# Patient Record
Sex: Male | Born: 1960
Health system: Southern US, Community
[De-identification: ages and names within clinical notes are randomized; demographics above are authoritative.]

## PROBLEM LIST (undated history)

## (undated) DIAGNOSIS — M1990 Unspecified osteoarthritis, unspecified site: Secondary | ICD-10-CM

## (undated) DIAGNOSIS — F10231 Alcohol dependence with withdrawal delirium: Secondary | ICD-10-CM

## (undated) DIAGNOSIS — I110 Hypertensive heart disease with heart failure: Secondary | ICD-10-CM

## (undated) DIAGNOSIS — F1910 Other psychoactive substance abuse, uncomplicated: Secondary | ICD-10-CM

## (undated) DIAGNOSIS — I639 Cerebral infarction, unspecified: Secondary | ICD-10-CM

## (undated) DIAGNOSIS — F209 Schizophrenia, unspecified: Secondary | ICD-10-CM

## (undated) DIAGNOSIS — F329 Major depressive disorder, single episode, unspecified: Secondary | ICD-10-CM

## (undated) DIAGNOSIS — F1010 Alcohol abuse, uncomplicated: Secondary | ICD-10-CM

## (undated) DIAGNOSIS — Z91148 Patient's other noncompliance with medication regimen for other reason: Secondary | ICD-10-CM

## (undated) DIAGNOSIS — M109 Gout, unspecified: Secondary | ICD-10-CM

## (undated) DIAGNOSIS — Z9114 Patient's other noncompliance with medication regimen: Secondary | ICD-10-CM

## (undated) DIAGNOSIS — F32A Depression, unspecified: Secondary | ICD-10-CM

## (undated) DIAGNOSIS — F10931 Alcohol use, unspecified with withdrawal delirium: Secondary | ICD-10-CM

## (undated) DIAGNOSIS — I5020 Unspecified systolic (congestive) heart failure: Secondary | ICD-10-CM

## (undated) DIAGNOSIS — F1490 Cocaine use, unspecified, uncomplicated: Secondary | ICD-10-CM

## (undated) DIAGNOSIS — I10 Essential (primary) hypertension: Secondary | ICD-10-CM

## (undated) HISTORY — PX: TONSILLECTOMY: SUR1361

## (undated) HISTORY — PX: MANDIBLE RECONSTRUCTION: SHX431

---

## 2002-10-09 ENCOUNTER — Emergency Department (HOSPITAL_COMMUNITY): Admission: EM | Admit: 2002-10-09 | Discharge: 2002-10-09 | Payer: Self-pay | Admitting: Emergency Medicine

## 2006-03-27 ENCOUNTER — Inpatient Hospital Stay (HOSPITAL_COMMUNITY): Admission: RE | Admit: 2006-03-27 | Discharge: 2006-04-01 | Payer: Self-pay | Admitting: Psychiatry

## 2006-03-27 ENCOUNTER — Ambulatory Visit: Payer: Self-pay | Admitting: Psychiatry

## 2008-01-23 ENCOUNTER — Inpatient Hospital Stay (HOSPITAL_COMMUNITY): Admission: EM | Admit: 2008-01-23 | Discharge: 2008-01-24 | Payer: Self-pay | Admitting: Emergency Medicine

## 2008-09-21 ENCOUNTER — Emergency Department (HOSPITAL_BASED_OUTPATIENT_CLINIC_OR_DEPARTMENT_OTHER): Admission: EM | Admit: 2008-09-21 | Discharge: 2008-09-21 | Payer: Self-pay | Admitting: Emergency Medicine

## 2008-11-13 ENCOUNTER — Other Ambulatory Visit: Payer: Self-pay | Admitting: Emergency Medicine

## 2008-11-14 ENCOUNTER — Inpatient Hospital Stay (HOSPITAL_COMMUNITY): Admission: EM | Admit: 2008-11-14 | Discharge: 2008-11-17 | Payer: Self-pay | Admitting: Internal Medicine

## 2008-11-14 ENCOUNTER — Ambulatory Visit: Payer: Self-pay | Admitting: Cardiology

## 2008-11-16 ENCOUNTER — Encounter (INDEPENDENT_AMBULATORY_CARE_PROVIDER_SITE_OTHER): Payer: Self-pay | Admitting: Internal Medicine

## 2008-12-30 ENCOUNTER — Inpatient Hospital Stay (HOSPITAL_COMMUNITY): Admission: EM | Admit: 2008-12-30 | Discharge: 2009-01-03 | Payer: Self-pay | Admitting: Emergency Medicine

## 2009-07-26 ENCOUNTER — Ambulatory Visit (HOSPITAL_BASED_OUTPATIENT_CLINIC_OR_DEPARTMENT_OTHER): Admission: RE | Admit: 2009-07-26 | Discharge: 2009-07-26 | Payer: Self-pay | Admitting: Geriatric Medicine

## 2009-07-26 ENCOUNTER — Ambulatory Visit: Payer: Self-pay | Admitting: Diagnostic Radiology

## 2011-03-05 LAB — CBC
HCT: 39.3 % (ref 39.0–52.0)
Hemoglobin: 13.3 g/dL (ref 13.0–17.0)
MCHC: 33.8 g/dL (ref 30.0–36.0)
MCHC: 34 g/dL (ref 30.0–36.0)
MCHC: 34.2 g/dL (ref 30.0–36.0)
MCV: 92.7 fL (ref 78.0–100.0)
MCV: 92.8 fL (ref 78.0–100.0)
Platelets: 74 10*3/uL — ABNORMAL LOW (ref 150–400)
Platelets: 80 10*3/uL — ABNORMAL LOW (ref 150–400)
Platelets: 97 10*3/uL — ABNORMAL LOW (ref 150–400)
RBC: 4.11 MIL/uL — ABNORMAL LOW (ref 4.22–5.81)
RDW: 12.6 % (ref 11.5–15.5)
RDW: 13 % (ref 11.5–15.5)
RDW: 13.2 % (ref 11.5–15.5)
RDW: 13.3 % (ref 11.5–15.5)

## 2011-03-05 LAB — COMPREHENSIVE METABOLIC PANEL
ALT: 43 U/L (ref 0–53)
ALT: 52 U/L (ref 0–53)
AST: 118 U/L — ABNORMAL HIGH (ref 0–37)
AST: 82 U/L — ABNORMAL HIGH (ref 0–37)
AST: 84 U/L — ABNORMAL HIGH (ref 0–37)
Albumin: 3.3 g/dL — ABNORMAL LOW (ref 3.5–5.2)
Albumin: 3.4 g/dL — ABNORMAL LOW (ref 3.5–5.2)
Alkaline Phosphatase: 99 U/L (ref 39–117)
CO2: 28 mEq/L (ref 19–32)
Calcium: 8.8 mg/dL (ref 8.4–10.5)
Calcium: 9.1 mg/dL (ref 8.4–10.5)
Creatinine, Ser: 1.01 mg/dL (ref 0.4–1.5)
GFR calc Af Amer: >60 mL/min (ref >60)
GFR calc Af Amer: >60 mL/min (ref >60)
GFR calc Af Amer: >60 mL/min (ref >60)
GFR calc non Af Amer: >60 mL/min (ref >60)
Glucose, Bld: 123 mg/dL — ABNORMAL HIGH (ref 70–99)
Potassium: 4 mEq/L (ref 3.5–5.1)
Sodium: 136 mEq/L (ref 135–145)
Sodium: 139 mEq/L (ref 135–145)
Total Protein: 6.8 g/dL (ref 6.0–8.3)
Total Protein: 7 g/dL (ref 6.0–8.3)

## 2011-03-05 LAB — DIFFERENTIAL
Basophils Absolute: 0 10*3/uL (ref 0.0–0.1)
Eosinophils Absolute: 0.1 10*3/uL (ref 0.0–0.7)
Eosinophils Relative: 2 % (ref 0–5)
Lymphocytes Relative: 37 % (ref 12–46)
Monocytes Absolute: 0.8 10*3/uL (ref 0.1–1.0)

## 2011-03-05 LAB — BASIC METABOLIC PANEL
BUN: 11 mg/dL (ref 6–23)
BUN: 14 mg/dL (ref 6–23)
CO2: 25 mEq/L (ref 19–32)
CO2: 27 mEq/L (ref 19–32)
Chloride: 101 mEq/L (ref 96–112)
Chloride: 107 mEq/L (ref 96–112)
Creatinine, Ser: 1.02 mg/dL (ref 0.4–1.5)
GFR calc Af Amer: >60 mL/min (ref >60)
GFR calc non Af Amer: >60 mL/min (ref >60)
Glucose, Bld: 103 mg/dL — ABNORMAL HIGH (ref 70–99)
Glucose, Bld: 94 mg/dL (ref 70–99)
Potassium: 4.1 mEq/L (ref 3.5–5.1)
Sodium: 139 mEq/L (ref 135–145)

## 2011-03-05 LAB — URINALYSIS, ROUTINE W REFLEX MICROSCOPIC
Glucose, UA: NEGATIVE mg/dL
Hgb urine dipstick: NEGATIVE
Ketones, ur: NEGATIVE mg/dL
Protein, ur: NEGATIVE mg/dL
Urobilinogen, UA: 0.2 mg/dL (ref 0.0–1.0)

## 2011-03-05 LAB — POCT CARDIAC MARKERS: Troponin i, poc: <0.05 ng/mL (ref 0.00–0.09)

## 2011-03-05 LAB — PHOSPHORUS: Phosphorus: 4.4 mg/dL (ref 2.3–4.6)

## 2011-03-05 LAB — CARDIAC PANEL(CRET KIN+CKTOT+MB+TROPI)
CK, MB: 1.8 ng/mL (ref 0.3–4.0)
CK, MB: 2.5 ng/mL (ref 0.3–4.0)
Relative Index: 0.7 (ref 0.0–2.5)
Relative Index: 0.8 (ref 0.0–2.5)
Troponin I: 0.01 ng/mL (ref 0.00–0.06)
Troponin I: <0.01 ng/mL (ref 0.00–0.06)

## 2011-03-05 LAB — TSH: TSH: 3.865 u[IU]/mL (ref 0.350–4.500)

## 2011-03-05 LAB — TROPONIN I: Troponin I: <0.01 ng/mL (ref 0.00–0.06)

## 2011-03-05 LAB — RAPID URINE DRUG SCREEN, HOSP PERFORMED
Amphetamines: NOT DETECTED
Benzodiazepines: POSITIVE — AB
Cocaine: POSITIVE — AB

## 2011-03-05 LAB — LIPID PANEL: VLDL: 24 mg/dL (ref 0–40)

## 2011-03-05 LAB — APTT: aPTT: 33 seconds (ref 24–37)

## 2011-03-05 LAB — HEMOGLOBIN A1C: Mean Plasma Glucose: 108 mg/dL

## 2011-03-05 LAB — PROTIME-INR: Prothrombin Time: 14.4 seconds (ref 11.6–15.2)

## 2011-03-05 LAB — TECHNOLOGIST SMEAR REVIEW

## 2011-03-05 LAB — D-DIMER, QUANTITATIVE: D-Dimer, Quant: 0.42 ug/mL-FEU (ref 0.00–0.48)

## 2011-04-02 NOTE — Discharge Summary (Signed)
Brown, [name]        ACCOUNT NO.:  [id number]   MEDICAL RECORD NO.:  [id number]          PATIENT TYPE:  INP   LOCATION:  4702                         FACILITY:  [facility]   PHYSICIAN:  Vernell Leep, MD     DATE OF BIRTH:  September 30, 1961   DATE OF ADMISSION:  12/29/2008  DATE OF DISCHARGE:  01/03/2009                               DISCHARGE SUMMARY   ADDENDUM:  Please refer to the detailed discharge summary done by this  dictator on January 02, 2009.  This will update events since.   DISCHARGE DIAGNOSES:  Same.   DISCHARGE MEDICATIONS:  1. Enteric-coated aspirin 81 mg p.o. daily.  2. Omeprazole 20 mg p.o. daily.  3. Thiamine 100 mg p.o. daily.  4. Folate 1 mg p.o. daily.  5. Hydralazine 50 mg p.o. q.i.d.  6. Risperidone 2 mg p.o. daily.  7. Clonidine 0.2 mg p.o. t.i.d.  8. Hydrochlorothiazide 25 mg p.o. daily.  9. Colace 100 mg p.o. b.i.d.  10.Amlodipine 10 mg p.o. daily.   HOSPITAL COURSE AND PATIENT DISPOSITION:  Since yesterday, the patient  from a medical standpoint was ready for discharge to [facility].  The patient was declined by the [facility] today  indicating that he did not meet criteria for inpatient admission there.  Dr. Rhona Raider was contacted who saw the patient again today and  indicated in his brief note that the mood was better and the patient has  positive interests in his guitar, funk music, and denied suicidal  ideations, homicidal ideations.  Dr. Rhona Raider has cleared him for  outpatient management at [facility].  Social worker was contacted who  indicates that [facility] might not be able to accept him today.  Hence,  the patient will be discharged home and he is to proceed to [facility]  whenever they are able to accept him.  The patient had indicated that he  did not have a primary medical doctor.  Case manager was contacted and  has provided him with the physician reference line, that is [phone].  The patient has  been advised to call there to find a primary medical  doctor with whom he has to make an appointment to follow up in 1 to 2  weeks.   The patient has also been provided with the crisis hotline number, that  is [phone].   The patient indicated yesterday that he had fallen on his right shoulder  approximately 3 weeks ago and had some shoulder pain at that time and  was seen at the [facility] ED were x-rays suggested right shoulder  injury.  The details were not available.  He was informed by the M.D.  there that it might take 6 weeks to heal.  He was given a referral to go  to an orthopedic M.D., which the patient did not keep.  Now the patient  indicates that he has mild right shoulder pain on elevating the right  shoulder.  On examination, the shoulder itself was nontender or swollen  or deformed.  There was a chest x-ray that was suggestive of question  acromioclavicular joint separation injury on the right side.  I called  and discussed with orthopedic M.D. on call, Dr. Weber Cooks of  [facility], who suggested that there is no need for a  dedicated film or any active intervention at this time unless his  symptoms  worsen.  He was happy to follow him in outpatient followup.  A number  for him has been provided to the patient, that is [phone].  The patient  has been advised to call there for an appointment.  The patient, at this  time, is stable to be discharged.      Vernell Leep, MD  Electronically Signed     AH/MEDQ  D:  01/03/2009  T:  01/03/2009  Job:  [id number]   cc:   Weber Cooks, M.D.  Felizardo Hoffmann, M.D.

## 2011-04-02 NOTE — H&P (Signed)
[name], [name]        ACCOUNT NO.:  [id number]   MEDICAL RECORD NO.:  [id number]          PATIENT TYPE:  INP   LOCATION:  4702                         FACILITY:  [facility]   PHYSICIAN:  Delice Lesch, MD        DATE OF BIRTH:  02/18/1961   DATE OF ADMISSION:  12/29/2008  DATE OF DISCHARGE:                              HISTORY & PHYSICAL   CHIEF COMPLAINT:  Increased blood pressure, denied admission at rehab.   PRIMARY CARE PHYSICIAN:  Durwin Nora, MD   This is an admission for team B with [facility].   HISTORY OF PRESENT ILLNESS:  This is a 50 year old African American man  with past medical history of polysubstance abuse (crack cocaine,  alcohol, and tobacco) with hypertension which has been uncontrolled  secondary to medication noncompliance and schizophrenia presenting with  increased blood pressure.  He went to rehab center today to try enroll  in the program and he was sent to the hospital secondary to increased  blood pressure.  This is his second try to get into rehab.  He says he  drinks everyday one-half to one case of beer, uses crack cocaine  everyday.  He knows that he needs help to quit, but he has refused on  the grounds of his increased blood pressure.  He also has chest pain  every time he uses alcohol and drugs.  This is approximately 4/10, left-  sided, throbbing, like a toothache lasting 3 to 4 hours, not related  to inspiration, food, or movement.  He has occasionally shortness of  breath, and the pain is nonradiating nor associated with palpations and  relieved only after stopping alcohol and drugs.  He also has headaches  every time his blood pressure is high.  No blood in stool.   PAST MEDICAL HISTORY:  1. Substance abuse as mentioned above.  He tried to get in [facility] for detox.  2. Hypertension with recent admission for hypertensive urgency in      December 2009.  3. Schizophrenia/bipolar disease.  4. History of  thrombocytopenia.   FAMILY HISTORY:  Coronary artery disease.  Father had an MI when he was  in his 61s.   ALLERGIES:  No known drug allergies.   MEDICATIONS AT HOME:  He did not take any medications for a long time as  he could not afford them.  However, he got the medications today, and he  started to take them.  1. Metoprolol 50 mg p.o. b.i.d.  2. Clonidine 0.2 mg p.o. t.i.d.  3. Hydralazine 50 mg p.o. t.i.d.  4. Chlordiazepoxide 25 mg p.o. b.i.d. for 3 days per instructions at      discharge.  5. Omeprazole 20 mg p.o. daily.  6. Risperdal 2 mg p.o. daily.   SOCIAL HISTORY:  He is single.  He has 7 children.  He lives alone in  [place].  He is on disability for his schizophrenia.  Substance abuse  per above.  Of note, smokes 1 pack a day of cigarettes.  His mother is  alive and checks on him occasionally.  Last  drug use was the night prior  to admission when he was cracked, smoked cigarettes and drank alcohol.   REVIEW OF SYSTEMS:  Chest pain as mentioned in HPI, headaches, throbbing  at the time of admission, poor memory, night sweats.  He lost 12 pounds  in 2 months, nausea, vomiting, sometimes diarrhea but no constipation  and admitted for fevers but no chills with no specifics.   PHYSICAL EXAMINATION:  VITAL SIGNS:  Temperature 97, pulse 75, blood  pressure 158/112, respiration rate 20, oxygen saturation 100% on room  air.  GENERAL:  He was in no acute distress.  HEENT:  Moist mucous membranes.  PERRLA, no pinpoint pupils.  CARDIAC:  Regular rate and rhythm.  No murmurs, rub, or gallop.  PULMONARY:  Clear to auscultation bilaterally.  GI:  Soft, nontender, and nondistended.  Bowel sounds positive.  EXTREMITIES:  No edema.  NEURO:  Nonfocal.   LABORATORY DATA:  Chest x-ray, no acute changes, questionable AC joint  separation on the right.  UDS positive for barbiturates,  benzodiazepines, and cocaine.  Benzodiazepines are expected with his use  of chlordiazepoxide.   Urinalysis, cloudy, density 1.025, small  bilirubin, otherwise normal.  Point-of-care cardiac enzymes negative.  Alcohol less than 5.  Sodium 139, potassium 4.1, chloride 107, bicarb  25, BUN 14, creatinine 1.09, glucose 94, calcium 8.7.  White count 4.9,  hemoglobin 13.3, platelets 97.  Of note in 2009, platelets were 77.  His  MCV is 92.8.  His EKG shows inferolateral T-wave inversions likely  secondary to strain.  They were present on the previous EKG in December,  however, now they are more pronounced.   ASSESSMENT AND PLAN:  1. Chest pain, most likely vasospasm, related to cocaine use.  He does      not report more chest pain now than at the time that he uses      cocaine.  His point-of-care cardiac enzymes are negative.  EKG is      as mentioned above.  Only significant for T-wave inversions.  We      will continue to cycle cardiac enzymes.  Check EKG in the morning.      An UDS was checked and was positive for cocaine, barbiturates, and      benzodiazepines.  Due to the fact that he uses cocaine everyday, he      should not be on Lopressor either here nor in outpatient.  We will      give nitroglycerin, aspirin 325, clonidine home dose, and      hydralazine.  If cardiac enzymes become negative, he will need a      cath.  A 2-D echo in December 2009 showed an EF of 55%.  We will      also check a fasting lipid profile and hemoglobin A1c.  2. Hypertension.  The patient's blood pressure is elevated on arrival      and then increased to 138/98.  His diastolic pressure is increased.      We will check a TSH along with the fasting lipid profile and a      hemoglobin A1c.  We will continue his home regimen with clonidine      and hydralazine.  We will add p.r.n. labetalol for blood pressure      higher than [id number] systolic, again avoid Lopressor.  Of note, he was      admitted with hypertensive urgency in December 2009.  Most likely      the  cause of his hypertension is his polysubstance  abuse.  3. Polysubstance abuse.  I offered counseling.  He knows he needs to      quit; however, he believes that he cannot do it by himself.  He is      disappointed by the fact that he is not admitted to the rehab      facility because of his high blood pressure.  However, the blood      pressure has increased because of his drug use.  We will ask the      case manager to help with this vicious cycle.  His UDS is positive      as mentioned above.  We will place him on Ativan p.r.n. for      increased catecholaminergic response to cocaine.  We will monitor      him on CIWA, give him B1 and folate.  This is most likely the cause      of his weight loss.  4. Thrombocytopenia.  This is likely secondary to alcohol.  However,      we will check LFTs and coags looking for liver dysfunction.  We      will also check a peripheral smear to look for large platelets or      clumped platelets, and we will also check an HIV especially since      he has weight loss and night sweats.  5. Right shoulder pain.  This appears on the x-ray as [facility] joint      separation on the right.  We will give him Tylenol      for now.  He does report pain in that shoulder especially when he      lies on it, may need to have physical therapy in outpatient.  6. Prophylaxis.  We will place him on Lovenox and Protonix.      Philemon Kingdom, M.D.  Electronically Signed      Delice Lesch, MD     CG/MEDQ  D:  12/30/2008  T:  12/30/2008  Job:  [id number]

## 2011-04-02 NOTE — Discharge Summary (Signed)
[name], FLOWERS        ACCOUNT NO.:  [id number]   MEDICAL RECORD NO.:  [id number]          PATIENT TYPE:  INP   LOCATION:  2920                         FACILITY:  [facility]   PHYSICIAN:  Jacquelynn Cree, M.D.   DATE OF BIRTH:  November 22, 1960   DATE OF ADMISSION:  01/22/2008  DATE OF DISCHARGE:  01/24/2008                               DISCHARGE SUMMARY   PRIMARY CARE PHYSICIAN:  The patient is unassigned.   DISCHARGE DIAGNOSES:  1. Hypertensive crisis.  2. Schizophrenia.  3. Mild thrombocytopenia.  4. Epistaxis.  5. History of polysubstance abuse including cocaine and alcohol.   DISCHARGE MEDICATIONS:  1. Norvasc 10 mg daily.  2. Clonidine 0.1 mg t.i.d.  3. Lasix 40 mg daily.  4. Risperdal Consta through mental health.   CONSULTATIONS:  None.   BRIEF ADMISSION HISTORY OF PRESENT ILLNESS:  The patient is a 50-year-  old male with known history of schizophrenia, who presents with  headache, dizziness, epistaxis, and chest pain.  The patient is a  resident at [facility] and has been treated for substance abuse.  He is  disabled.  Upon initial evaluation, he was noted to have a blood  pressure of 196/125 and therefore was admitted for treatment of  hypertensive crisis.   PROCEDURES AND DIAGNOSTIC STUDIES:  Chest x-ray on January 22, 2008 showed  no active cardiopulmonary disease.   DISCHARGE LABORATORY VALUES:  Sodium was 139, potassium 3.5, chloride  104, bicarb 30, BUN 8, creatinine 1.14, glucose 118.  White blood cell  count was 4.5, hemoglobin 12.1, hematocrit 35.9, platelets 97.  Cardiac  enzymes were negative x3 sets.   HOSPITAL COURSE:  Problem:  1. Hypertensive crisis:  The patient was admitted and put on a Cardene      drip for blood pressure control.  Once blood pressure control was      achieved, he was transitioned over to p.o. medications including      Norvasc, Lasix, clonidine.  This regimen controlled his blood      pressure, and he was successfully weaned off  the Cardene drip.  The      patient's hypertensive crisis likely stems from noncompliance.  The      patient was counseled extensively, with regard to the importance of      taking all of his medications as prescribed and was following up      with a primary care physician.  2. Schizophrenia:  The patient was receives Risperdal Consta through      the mental health association.  He should continue to follow up      with them for his regular injections.  3. Epistaxis:  The patient's epistaxis likely stemmed from his      hypertensive crisis, along with mild thrombocytopenia.  It      completely resolved, with control of his blood pressure.  4. Mild thrombocytopenia:  May be sequelae from toxic effects of      alcohol and bone marrow.  He does have a history of substance      abuse.   DISPOSITION:  The patient will be discharged back to [facility], which is  a substance abuse halfway house.  He should follow up with his regular  primary care physician in 1 to 2 weeks.  If he does not have a local  physician, the number to obtain a referral to a local physician was  provided to the patient at discharge.      Jacquelynn Cree, M.D.  Electronically Signed     CR/MEDQ  D:  01/24/2008  T:  01/25/2008  Job:  [id number]

## 2011-04-02 NOTE — H&P (Signed)
[name], [name]        ACCOUNT NO.:  [id number]   MEDICAL RECORD NO.:  [id number]          PATIENT TYPE:  INP   LOCATION:  2920                         FACILITY:  [facility]   PHYSICIAN:  Edythe Lynn, M.D.       DATE OF BIRTH:  1961/05/29   DATE OF ADMISSION:  01/22/2008  DATE OF DISCHARGE:  01/24/2008                              HISTORY & PHYSICAL   PRIMARY CARE PHYSICIAN:  This patient does not have a primary care  physician.   CHIEF COMPLAINT:  Headaches.   HISTORY OF PRESENT ILLNESS:  [name] is a 50 year old gentleman  with history of hypertension, currently not taking any medications, who  presents today to the emergency room after about 3 days of constant  headaches, dizziness and no chest pain.  The patient was at [facility]  for detoxification for cocaine and alcohol.  He used to be on clonidine  but has not been taking it for a while because, I don't need it  anymore.   PAST MEDICAL HISTORY:  1. Hypertension.  2. Schizophrenia.   FAMILY HISTORY:  Mother alive, age 11, diabetes.  Father died of unknown  cause.  Two sisters and three brothers healthy.   SOCIAL HISTORY:  The patient is abusing cocaine and alcohol.  He has got  seven children, all grown up and gone.  He is on disability for  schizophrenia.  Lives alone.   ALLERGIES:  NO KNOWN DRUG ALLERGIES.   MEDICATIONS:  1. Risperdal.  2. Consta intramuscularly once every 2 weeks.   REVIEW OF SYSTEMS:  Positive for occasional chest pains.  Otherwise per  HPI.  All other systems negative.   PHYSICAL EXAM:  VITAL SIGNS:  Upon admission, temperature 98.6, pulse  72, respirations 18, blood pressure 196/125, saturation 100% on room  air.  GENERAL APPEARANCE:  Muscular, well-built, African American gentleman  sitting on the stretcher.  He is lethargic but arousable.  He is not  oriented to place, person, time and situation.  HEAD:  Normocephalic, atraumatic.  Eyes:  Pupils equal, round, reactive  to  light and accommodation.  Extraocular movements are intact.  Throat  clear.  NECK: Supple.  No JVD.  CHEST: Clear to auscultation without wheezes, rhonchi or crackles.  HEART:  Regular rate and rhythm without murmurs, rubs or gallop.  ABDOMEN:  The patient's abdomen is soft, nontender, nondistended.  Bowel  sounds are present.  LOWER EXTREMITIES: Without edema.  SKIN:  Warm, dry, without any suspicious rashes.  NEUROLOGICAL EXAM:  Nonfocal.   LABORATORY DATA:  Sodium 139, potassium 3.6, chloride 104, BUN 9,  creatinine 1.2, glucose 109.  Urine drug screen negative for cocaine.   RADIOGRAPHIC DATA:  Chest X-Ray: No acute disease.   ASSESSMENT AND PLAN:  Hypertensive crisis in a 50 year old gentleman who  has hypertension, stage III, and noncompliant with treatment.  His  symptoms are mainly geared towards the CNS-type symptoms with early  hypertensive encephalopathy.  He is going to get admitted to a step-down  unit, placed on a Cardene drip and his clonidine will be resumed.  He  will be placed on diuretic as well.  Constant monitoring of the renal  function as well as vital signs will be undertaken.      Edythe Lynn, M.D.  Electronically Signed     SL/MEDQ  D:  01/23/2008  T:  01/25/2008  Job:  [id number]

## 2011-04-02 NOTE — H&P (Signed)
NAMEAAMIR, [name]        ACCOUNT NO.:  [id number]   MEDICAL RECORD NO.:  [id number]          PATIENT TYPE:  INP   LOCATION:  2304                         FACILITY:  [facility]   PHYSICIAN:  Silvestre Moment, MDDATE OF BIRTH:  May 22, 1961   DATE OF ADMISSION:  11/14/2008  DATE OF DISCHARGE:                              HISTORY & PHYSICAL   CHIEF COMPLAINT:  Transfer from [facility] for hypertensive urgency.   HISTORY OF PRESENT ILLNESS:  [name] is a 50 year old gentleman  with longstanding history of uncontrolled hypertension as well as  polysubstance abuse.  He has been in the [facility] for  detox from alcohol and crack cocaine for the last week.  Since that time  he has been intermittently taking his hypertensive medications.  He  reports they have had difficulty controlling his blood pressure.  Prior  to entering the facility he was taking no medication.  On the 27th they  found his blood pressure to be 197/117.  They sent him to the [facility]  Emergency Department.  In that emergency department they were  unsuccessful in reducing his blood pressure with his home medication of  metoprolol, clonidine and hydrochlorothiazide.  They also tried  labetalol and they finally gained controlled with nitroglycerin drip.  They therefore transferred him to [facility] for a monitored bed.   [name] reports having headache, mild blurry vision with this  hypertensive episode.  He also had approximately 5 minutes of left-sided  chest pain or pressure that did not radiate.  It was not associated with  nausea or shortness of breath.  He says this constellation of symptoms  is typical for his extreme high blood pressure episodes.  Other than  that, [name] states that his mood has been depressed since coming  off of the cocaine and alcohol.  He denied suicidal ideation.  He does  report some mild hallucinations with the detox.  Otherwise the detox is  going  well.   REVIEW OF SYSTEMS:  A comprehensive 10-point review of systems was  obtained and is negative except as per HPI.   PAST MEDICAL HISTORY:  1. Uncontrolled hypertension.  2. Polysubstance abuse.  3. Schizophrenia/bipolar disorder.  4. Status post tonsillectomy.   SOCIAL HISTORY:  He lives in [place] by himself.  He has been in the  [facility] for 1 week.  He has attempted multiple  detox's in the past.  He has little contact with his family, although  his mom does check on him from time to time.  He smoked a pack per day  for 20 years.  When drinking, he drinks approximately one case of beer  per day.  He has recently used crack cocaine.   FAMILY HISTORY:  Coronary artery disease.   ALLERGIES:  No known drug allergies.   MEDICATIONS:  At the [facility] facility include:  1. Multivitamin daily.  2. Thiamine 100 mg daily.  3. Clonidine 0.1 mg 3 times daily.  4. Metoprolol 25 mg twice a day.  5. Hydrochlorothiazide 25 mg daily.   VITAL SIGNS:  Pulse 87, respiratory rate 18, blood  pressure 170/96, O2  sat 100% on room air.  GENERAL:  Well-nourished, well-developed gentleman sitting in the bed in  no acute distress.  HEENT:  Sclerae anicteric.  Oropharynx clear.  NECK:  Supple.  HEART:  Regular with no murmurs.  LUNGS:  Clear to auscultation bilaterally.  ABDOMEN:  Soft, nontender, no masses palpated.  EXTREMITIES:  Warm with no edema.  NEURO:  Cranial nerves II-XII intact, 5/5 strength and sensation  throughout.   DATA REVIEW:  1. Labs from [facility]:  Glucose 128, otherwise chemistry within      normal limits.  2. EKG:  Normal sinus rhythm, normal axis, LVH, no ST or T-wave      abnormalities.   IMPRESSION AND PLAN:  1. Hypertensive urgency:  [name] has had longstanding issues      with his hypertension.  It is likely somewhat exacerbated by his      detox from alcohol and crack cocaine.  His blood pressure is now      better managed on  the nitroglycerin drip.  We will titrate to keep      the systolic blood pressures under 170.  We will also reinitiate      his home regimen of clonidine, metoprolol and hydrochlorothiazide      at the dosages stated above.  There is no evidence of stroke or      renal insufficiency.  We will check his troponins x3 given the fact      that he had a episode of chest pain earlier in the day.  2. Polysubstance abuse:  Detox medications are not on his medication      list from [facility].  We will give him Ativan for CIWA greater than      7 per our protocol, as withdrawal may be exacerbating his      hypertension.  We will plan to discharge him back to the [facility] once his blood pressure is controlled.  3. Schizophrenia and bipolar disorder:  [name] reports being on      Risperdal in the past.  He believes his doses is 5 mg.  He was on      Risperdal at his last admission to [facility] in March 2009.  We      will restart at lower dose and then just titrate it as an      outpatient as needed.  4. Deep venous thrombosis prophylaxis:  We will not use pharmacologic      prophylaxis due to his hypertensive issues.  We will use SCDs.      Silvestre Moment, MD  Electronically Signed     WT/MEDQ  D:  11/14/2008  T:  11/14/2008  Job:  [id number]

## 2011-04-02 NOTE — Consult Note (Signed)
[name], [name]        ACCOUNT NO.:  [id number]   MEDICAL RECORD NO.:  [id number]          PATIENT TYPE:  INP   LOCATION:  4702                         FACILITY:  [facility]   PHYSICIAN:  Felizardo Hoffmann, M.D.  DATE OF BIRTH:  13-Jun-1961   DATE OF CONSULTATION:  12/30/2008  DATE OF DISCHARGE:                                 CONSULTATION   REQUESTING PHYSICIAN:  [facility] [employer].   REASON FOR CONSULTATION:  Severe depression, suicidal ideation,  polysubstance abuse.   HISTORY OF PRESENT ILLNESS:  [name] is a 50 year old male admitted  to the [facility] on December 29, 2008, with chest pain, rule out  myocardial infarction.   [name] describes over 3 weeks of progressive depressed mood, poor  concentration, anhedonia, difficulty concentrating and decreased energy  when he is not using cocaine.  He has the shame of cocaine relapse which  does correlate with his worsening depression.  He has developed a  suicidal ideation pattern and has also a plan to overdose.   He is oriented to all spheres, his memory function is intact.  He is  cooperative with bedside care.  He is non-combative.  He is not  currently on any psychotropic medication.   PAST PSYCHIATRIC HISTORY:  In review of the past medical record, Mr.  [name] has suffered from depression before he was admitted to the [facility] in 2007.   [name] has been diagnosed in the past with schizophrenia and  bipolar disorder.   FAMILY PSYCHIATRIC HISTORY:  None known.   SOCIAL HISTORY:  [name] is separated.  He was supposed to be  taking his medication; however, he has not been compliant with his  medication regimen which also did include Risperdal 2 mg per day.   Although he is separated, he has 7 children.  He has been living by  himself in [place], [place].  Occupation:  Unemployed and  disabled for schizophrenia diagnosis.   He has been relapsing on crack  cocaine.  He also has been taking  alcohol.   PAST MEDICAL HISTORY:  1. Hypertension.  2. History of thrombocytopenia.   MEDICATIONS:  The MAR is reviewed.  [name] is not on any current  psychotropic medication.  Please see the plan below.   He has no known drug allergies.   LABORATORY DATA:  Hemoglobin A1c 5.4, TSH normal.  HIV nonreactive.  Magnesium normal, phosphorus normal.  Sodium 139, BUN 11, creatinine  1.2, glucose 115.  SGOT is 118, SGPT 52, INR is normal.  WBC 3.5,  hemoglobin 13.3, platelet count is decreased at 80.   Urine drug screen on December 29, 2008, was positive for barbiturates,  positive for benzodiazepines and positive for cocaine.   Alcohol level was negative.   REVIEW OF SYSTEMS:  CONSTITUTIONAL:  Head, eyes, ears, nose, throat,  mouth, neurologic unremarkable.  PSYCHIATRIC:  [name] had been  prescribed Risperdal 2 mg daily for maintenance anti-psychosis.  He had  been going to outpatient psychiatric visits at the [facility].  He had stated that he tried to get into  [facility]  but has not been in the inpatient unit yet for substance rehabilitation.  CARDIOVASCULAR/RESPIRATORY/GASTROINTESTINAL/GENITOURINARY/SKIN/MUSCULOSK  ELETAL/HEMATOLOGIC/LYMPHATIC/ENDOCRINE/METABOLIC:  Unremarkable.   PHYSICAL EXAMINATION:  VITAL SIGNS:  Temperature 97.3, pulse 72,  respiratory rate 18, blood pressure 128/92, O2 saturation on room air  98%.  GENERAL APPEARANCE:  [name] is a middle-aged male partially  reclined in the supine position in his hospital bed with no abnormal  involuntary movements.  MENTAL STATUS EXAM:  [name] is alert.  He is oriented to all  spheres.  His eye contact is intact.  His attention span is mildly  decreased, concentration mildly decreased.  Affect is constricted with  tears.  Mood is severely depressed.  His memory function is intact to  immediate, recent and remote.  His fund of knowledge  and intelligence  are within normal limits.  His speech is very soft, however, there is  normal rate.  The prosody is mildly flat.  There is no dysarthria.  Thought process is logical, coherent, goal-directed, no looseness of  associations.  Thought content:  He does have thoughts of harming  himself, please see the history of present illness.  He has no  hallucinations or delusions at this time.  His insight is partial, his  judgment is impaired.   He does have the ability to consent to further psychiatric care  including inpatient care.   ASSESSMENT:  AXIS I:  (1)  293.82, mood disorder not otherwise specified  (idiopathic as well as substance factors and possibly general medical  factors), depressed.  (2)  History, by chart, of schizophrenia.  (3)  Polysubstance dependence.  AXIS II:  Deferred.  AXIS III:  See past medical history.  AXIS IV:  (1)  Primary support group.  (2)  General medical.  AXIS V:  Global Assessment of Functioning 30.   [name] is at risk for suicide.  Also, he has had relapse on  recreational substances.  He would be a candidate for an inpatient dual  diagnosis program on a psychiatric ward.   The undersigned provided ego-supportive psychotherapy and education.   The indications, alternatives and adverse effects of Risperdal as well  as Ativan p.r.n. were discussed.  He understands and wants to proceed.   RECOMMENDATION:  1. Would proceed with Risperdal 2 mg daily for his antipsychotic      preventive regimen.  Also, Risperdal can provide some acute mood      stabilization.  However, will defer the antidepressant at this      point.  2. Would utilize Ativan 1 mg q.4 h p.r.n. agitation.  3. Would continue ego support.  4. Would admit to an inpatient psychiatric unit once medically      cleared.      Felizardo Hoffmann, M.D.  Electronically Signed     JW/MEDQ  D:  01/03/2009  T:  01/03/2009  Job:  [id number]

## 2011-04-02 NOTE — Discharge Summary (Signed)
NAMEMarland Kitchen  Brown, [name]     ACCOUNT NO.:  [id number]   MEDICAL RECORD NO.:  [id number]          PATIENT TYPE:  INP   LOCATION:  2304                         FACILITY:  [facility]   PHYSICIAN:  Durwin Nora, MDDATE OF BIRTH:  [birth year]   DATE OF ADMISSION:  11/14/2008  DATE OF DISCHARGE:  11/17/2008                               DISCHARGE SUMMARY   ADDENDUM:  Please add anemia of chronic disease to the discharge diagnoses.   Also, remove Altace 2.5 daily for now.  We will consider adding this if  __________ as an outpatient blood pressure is not well controlled.  This  is to be determined by the primary care physician as an outpatient.      Durwin Nora, MD  Electronically Signed     [name]/MEDQ  D:  11/17/2008  T:  11/18/2008  Job:  [id number]

## 2011-04-02 NOTE — Consult Note (Signed)
NAMEJAHREL, LACOMB        ACCOUNT NO.:  [id number]   MEDICAL RECORD NO.:  [id number]          PATIENT TYPE:  INP   LOCATION:  [id number]                         FACILITY:  [facility]   PHYSICIAN:  Felizardo Hoffmann, M.D.  DATE OF BIRTH:  10/28/1961   DATE OF CONSULTATION:  01/03/2009  DATE OF DISCHARGE:  01/03/2009                                 CONSULTATION   Mr. Hebb's mood has improved.  He now has interests and constructive  future goals.  In an open-ended conversation, he describes his interest  in guitar and punk music.   He is not having any thoughts of harming himself or others.  He is not  having any hallucinations or delusions.  He is cooperative.   REVIEW OF SYSTEMS:  NEUROLOGIC:  No stiffness or other extrapyramidal  side effects from his Risperdal.   His psychotropic medication continues to be his Risperdal 10 mg daily  for psychotic prevention.   Sodium 138, BUN 9, creatinine 1.01, glucose 123, SGOT 84, SGPT 43.   WBC 4.0, hemoglobin 13.0, platelet count 85.   PHYSICAL EXAMINATION:  VITAL SIGNS:  Temperature 97.4, pulse 66 respite  respiratory rate 18, blood pressure 132/86, O2 saturation on room air  100%.  GENERAL APPEARANCE:  Mr. Cubero is a middle-aged male sitting up in  his hospital bed with no abnormal involuntary movements.  MENTAL STATUS EXAM:  Mr. Olivos has intact eye contact.  He is alert.  His affect is broad and appropriate.  His mood is within normal limits.  Concentration within normal limits.  He is oriented to all spheres.  His  memory is intact to immediate recent and remote.  His fund of knowledge  and intelligence are normal.  His speech involves normal rate and  prosody.  There is no dysarthria.  Thought process is logical, coherent,  goal directed.  No looseness of associations.  Thought content, no  thoughts of harming himself or others.  No delusions or hallucinations.  His insight is intact.  Judgment is intact.   ASSESSMENT:   AXIS I:  293.82 psychotic disorder, not otherwise  specified.  He does have a history documented in the past medical record  of schizophrenia and he is on maintenance Risperdal medications.   Polysubstance dependence.   Mood disorder, not otherwise specified, now stable.   Mr. Harvan is not at risk to harm himself or others.  He agrees to  call Emergency Services immediately for any thoughts of harming himself,  thoughts of harming others, hallucinations, or distress.   The undersigned provided ego supportive psychotherapy and education.   RECOMMENDATIONS:  1. A 12-step method and groups.  2. [facility] for chemical dependence residential rehabilitation.  3. Continue his Risperdal with periodic monitoring of abnormal      involuntary movement skills and hemoglobin A1c checks for side      effects.  4. Outpatient psychiatric followup for his medication management.  5. The social worker can help with the above followup procedures.      Felizardo Hoffmann, M.D.  Electronically Signed     JW/MEDQ  D:  05/26/2009  T:  05/27/2009  Job:  [id number] 

## 2011-04-02 NOTE — Discharge Summary (Signed)
[name], [name]        ACCOUNT NO.:  [id number]   MEDICAL RECORD NO.:  [id number]          PATIENT TYPE:  INP   LOCATION:  2304                         FACILITY:  [facility]   PHYSICIAN:  Durwin Nora, MDDATE OF BIRTH:  [birth year]   DATE OF ADMISSION:  11/14/2008  DATE OF DISCHARGE:  11/17/2008                               DISCHARGE SUMMARY   DISCHARGE DIAGNOSES:  1. Hypertensive disease, resolved.  2. History of polysubstance abuse; alcohol, tobacco, and crack      cocaine.  3. Schizophrenia.  4. Bipolar disorder.  5. Chest pain, noncardiac.  6. Thrombocytopenia.   HOSPITAL COURSE:  This is a 50 year old African American male with  longstanding history of hypertension, polysubstance abuse, who had been  at __________ treatment center for detox over the last week.  Of note,  was having difficulty to control his blood pressure __________ admitted  with a blood pressure of 197/117 at the [facility].  The patient was transferred to [facility] intensive care unit, started  on nitro drip.  He was also complaining of some chest discomfort.  His  hypertensive disease was resolved.  The patient has been weaned off  nitro drip, chest pain free, not short of breath.  Again, he was wanting  to discharge against medical advice earlier in the day, was then  convinced where to get a blood pressure medication prescription.  Consulted on need for good medication compliance and clinic followup.   DISCHARGE CONDITION:  Stable.   DIET:  Heart healthy, low cholesterol.   ACTIVITY:  Increase slowly as tolerated.   FOLLOWUP:  To follow up at the [facility] in 5-7 days.   MEDICATIONS ON DISCHARGE:  1. Multivitamin 1 tablet daily.  2. Thiamine 100 mg daily.  3. Librium 25 mg b.i.d.  4. Hydrochlorothiazide 25 mg daily.  5. Risperidone 2 mg daily.  6. Clonidine 0.2 mg t.i.d.  7. Hydralazine 50 mg t.i.d.  8. Metoprolol 50 mg t.i.d.  9.  Nitroglycerin 0.4 mg sublingual q.5 minutes x3.  10.Altace 2.5 mg daily.  11.Enteric-coated aspirin 81 mg daily.  12.Prilosec 20 mg daily.   PHYSICAL EXAMINATION:  GENERAL:  Today, he is a middle-aged man, not in  acute distress.  HEENT:  Pupils are equal and reactive to light and accommodation.  Head  is atraumatic and normocephalic.  Mucous membranes are moist.  Oropharynx and nasopharynx are clear.  NECK:  Supple.  No elevated JVD or thyromegaly.  No carotid bruit.  CHEST:  No chest wall deformity.  There is no rhonchi or rales.  HEART:  Heart sounds 1 and 2, regular rate and rhythm.  No rubs,  gallops, or murmurs.  ABDOMEN:  Soft, nontender.  Bowel sounds present.  No organomegaly.  NEUROLOGIC:  He is alert and oriented in time, place, and person.  EXTREMITIES:  Peripheral pulses present.  No pedal edema.   A 2-D echocardiogram performed on November 16, 2008, normal left  ventricular systolic function, ejection fraction was 55%.  There was no  left ventricular regional wall motion abnormality, mild increased left  ventricular wall thickness, and mild ventricular dilatation.  RADIOLOGY:  Chest x-ray, old x-ray of March 2009 shows no active  disease.   The patient's illness, medication, treatment plan, and need to follow up  with primary care physician, and compliance with medication explained to  him.  The patient is to follow up with the detox, possibly as outpatient  in the next 1-2 weeks.   LABORATORY DATA:  The chemistry shows a sodium of 137, potassium 4.0,  chloride 101, bicarbonate 28, glucose 132, BUN 5, and creatinine 1.1.  Complete blood count:  WBC is 5.4, hematocrit 32, and platelet count is  77.  Serial cardiac enzymes and EKG were normal.      Durwin Nora, MD  Electronically Signed     [name]/MEDQ  D:  11/17/2008  T:  11/18/2008  Job:  [id number]

## 2011-04-02 NOTE — Discharge Summary (Signed)
NAMESRICHARAN, [name]        ACCOUNT NO.:  [id number]   MEDICAL RECORD NO.:  [id number]          PATIENT TYPE:  INP   LOCATION:  [id number]                         FACILITY:  [facility]   PHYSICIAN:  [name] Leep, MD     DATE OF BIRTH:  06/04/1961   DATE OF ADMISSION:  12/29/2008  DATE OF DISCHARGE:  01/02/2009                               DISCHARGE SUMMARY   PRIMARY MEDICAL DOCTOR:  [name].   DISCHARGE DIAGNOSIS:  1. Chest pain - resolved.  Cardiac workup negative.  2. Poorly controlled hypertension.  3. Leukopenia - resolved.  4. Thrombocytopenia, chronic - stable.  5. Tobacco abuse.  6. Alcoholism.  No withdrawal signs or symptoms.  7. Substance abuse - crack cocaine.  8. Suicidal depression.   DISCHARGE MEDICATIONS:  1. Enteric coated aspirin 81 mg p.o. daily.  2. Protonix 40 mg p.o. daily.  3. Thiamine 100 mg p.o. daily.  4. Folate 1 mg p.o. daily.  5. Hydralazine 50 mg p.o. q.i.d.  6. Risperidone 2 mg p.o. daily.  7. Clonidine 0.2 mg p.o. t.i.d.  8. Hydrochlorothiazide 25 mg p.o. daily.  9. Senokot-S two tablets p.o. b.i.d.  10.Dulcolax 10 mg per rectal p.r.n.  11.MiraLax 17 grams in 8 ounces of liquid p.o. daily.  12.Amlodipine 10 mg p.o. daily.  13.Ativan 1 mg p.o. q.i.d. p.r.n. for anxiety or agitation.   PROCEDURES:  1. Nuclear medicine Myoview on December 31, 2008.  Impression:  No      evidence for reversible defect or ischemia.  Ejection fraction      measures 51%.  Fixed defect along the inferior wall is probably      related to diaphragmatic attenuation.  2. Chest x-ray on February 11.  Impression:  A.  No active cardiopulmonary process.  B.  Question acromioclavicular joint separation injury on the right.   PERTINENT LABORATORY DATA:  Comprehensive metabolic panel today with BUN  9, creatinine 1.01, AST 84, ALT 43, albumin 3.3, otherwise unremarkable.  CBC with hemoglobin 13, hematocrit 38.1, white blood cells 4, platelets  85.  D-dimer negative at  0.42.  Cardiac enzymes cycled and not  suggestive of acute coronary syndrome.  Hemoglobin A1c 5.4.  TSH 3.865.  HIV antibody is nonreactive.  Lipid panel with HDL 26, LDL 29,  triglycerides 121, VLDL 24, cholesterol 79.  Urine drug screen on  February 11 positive for barbiturates, benzodiazepines, and cocaine.  Urinalysis not suggestive of urinary tract infection.  Blood alcohol  level on February 11 less than 5.   CONSULTATIONS:  Cardiology, [name].   HOSPITAL COURSE AND PATIENT DISPOSITION:  [name] is a 50 year old  African American male patient with history of polysubstance abuse  including crack cocaine, alcohol, and tobacco, hypertension which is  uncontrolled secondary to medication noncompliance, and schizophrenia.  He now presented with elevated blood pressure.  Apparently the patient  went to the rehab center to try to enroll in the program but his  admission there was declined and he was sent to the ED because of  elevated blood pressure.  He planned to go to rehab to get off the  substance abuse.  He  also complained of chest pain which had been  ongoing for almost eight months, when ever he used drugs and drank  alcohol which was not pleuritic in nature.  He was then admitted for  further evaluation and management.  His blood pressure on admission was  158/112 mmHg.  He was admitted to telemetry for further evaluation and  management.  1. Chest pain which was thought to be secondary to vasospasm from      cocaine use.  He was admitted to telemetry.  Cardiac enzymes showed      mildly elevated CKs but negative troponins.  However, given his      history of abusing cocaine and chest pain which was ongoing for a      long time, cardiology was consulted.  They performed a Myoview.      This was not suggestive of any ischemia.  They have indicated no      further cardiac workup.  Obviously, he has to be compliant with his      blood pressure medications.  He should not  be on metoprolol given      his recurrent history of cocaine abuse.  He is to continue aspirin.  2. Poorly controlled hypertension.  His blood pressures are mostly in      the 140s-150s/100s.  However, he has periodic elevations in the      blood pressure in the 150s-160s/110s.  These are not being verified      manually.  The patient is currently on clonidine, hydralazine, and      hydrochlorothiazide.  Will increase his hydralazine to q.i.d. and      add Norvasc.  He is, however, asymptomatic of any headaches, chest      pain or dyspnea.  Compliance with medications has been strongly      reinforced in this patient.  3. Leukopenia which is probably secondary to the bone marrow      depressing effect of alcohol abuse.  This has resolved.  4. Thrombocytopenia which from prior records seems to be chronic is      stable.  5. Tobacco abuse.  Cessation counseling done.  6. Substance abuse, crack cocaine.  Again, cessation counseling done.  7. Suicidal depression.  The patient indicated on admission that he      had plans to hurt himself and had a plan prior to admission but      none since being here.  One on one sitter was placed and psychiatry      was consulted.  Dr. Rhona Brown indicated that he would recommend      admitting to a psych ward.   At this time, the patient is asymptomatic and should be able to transfer  to [facility] when a bed is available with adjusted blood  pressure medications.      [name] Leep, MD  Electronically Signed     AH/MEDQ  D:  01/02/2009  T:  01/02/2009  Job:  [id number]   cc:   [name]. [name] Gip, MD  [name], M.D.

## 2011-04-05 NOTE — H&P (Signed)
[name], [name]        ACCOUNT NO.:  [id number]   MEDICAL RECORD NO.:  [id number]          PATIENT TYPE:  IPS   LOCATION:  0300                          FACILITY:  [facility]   PHYSICIAN:  Norm Salt, MD  DATE OF BIRTH:  Mar 27, 1961   DATE OF ADMISSION:  03/27/2006  DATE OF DISCHARGE:                         PSYCHIATRIC ADMISSION ASSESSMENT   IDENTIFYING INFORMATION:  The patient is a 50 year old separated African-  American male voluntarily admitted on Mar 27, 2006.   HISTORY OF PRESENT ILLNESS:  The patient presents with a history of alcohol  use, using all that he can get.  His last use was today of this admission.  Also has been using cocaine for the past 15 years.  His longest history of  sobriety has been eight months while he was in jail.  He denies any suicidal  thoughts.  He does have complaints of nausea at this time.   PAST PSYCHIATRIC HISTORY:  First admission to [facility].  He  is an outpatient of [facility].  He sees a person called  Occupational hygienist.  Last name is unknown.  He has a history of being detoxed from  alcohol about two months ago.   SOCIAL HISTORY:  This is a 50 year old separated African-American male.  He  has seven children ranging from ages of 30 to infant.  He lives alone.  He  is unemployed.  He was in jail for approximately 8-9 months.  His jail time  was years ago.  He states it was for misdemeanors.  He has no current  criminal charges.   FAMILY HISTORY:  None.   ALCOHOL/DRUG HISTORY:  The patient smokes cigarettes.  The patient denies  any seizures, blackouts related to alcohol use.  He denies any IV drug use.  Denies any other substances.   PRIMARY CARE PHYSICIAN:  None.   MEDICAL PROBLEMS:  Hypertension.   MEDICATIONS:  The patient has been on Risperdal 35 mg injectable, getting  his injections approximately every two weeks.  Last injection is unclear.   ALLERGIES:  No known allergies.   PHYSICAL  EXAMINATION:  The patient was fully assessed at [facility].  Temperature 97.4, heart rate 83, respirations 16, blood pressure  120/79.  Height 5 feet 9 inches tall, weight 172.5 pounds.   REVIEW OF SYSTEMS:  Positive for hypertension.  No cardiac, no shortness of  breath, no nausea and vomiting, no falls, no dizziness, no visual  difficulties, no urinary frequency, urgency, no joint pain.  Positive for  substance abuse.   LABORATORY DATA:  His alcohol level is 194, glucose is 101.  Urine drug  screen is positive for cocaine.  CMET shows AST elevated at 81 with ALT  elevated at 48.   MENTAL STATUS EXAM:  Fully alert, in bed, good eye contact.  Speech is  clear.  The patient feels neutral.  Affect:  The patient is lying in bed  appearing to have some physical discomfort.  Thought processes are coherent.  There is no evidence of psychosis.  Cognitive function is intact.  Memory is  fair.  Judgment is fair.  Insight fair.  Poor impulse control.  Poor  historian.   DIAGNOSES:  AXIS I:  Polysubstance abuse.  AXIS II:  Deferred.  AXIS III:  None.  AXIS IV:  Other psychosocial problems related to chronic substance abuse.  AXIS V:  Current 40.   PLAN:  To detox the patient.  Monitor withdrawal symptoms.  Work on relapse  prevention.  Will have Symmetrel for cocaine use.  Have Librium protocol for  alcohol use.  The patient is to encourage fluids and attend groups.   TENTATIVE LENGTH OF STAY:  Three to five days.      Redgie Grayer, N.P.      Norm Salt, MD  Electronically Signed    JO/MEDQ  D:  03/31/2006  T:  03/31/2006  Job:  [id number]

## 2011-04-05 NOTE — Discharge Summary (Signed)
[name], SELFE        ACCOUNT NO.:  [id number]   MEDICAL RECORD NO.:  [id number]          PATIENT TYPE:  IPS   LOCATION:  0300                          FACILITY:  [facility]   PHYSICIAN:  Norm Salt, MD  DATE OF BIRTH:  1961-04-25   DATE OF ADMISSION:  03/27/2006  DATE OF DISCHARGE:  04/01/2006                                 DISCHARGE SUMMARY   IDENTIFYING DATA/REASON FOR ADMISSION:  The patient is a 50 year old  separated African-American male who has seven children and is unemployed.  He lives in [place], [place].  He presented with a strong desire  for detoxification from alcohol and other drugs.  He had had detoxification  several years ago but states I wasn't ready, but I'm ready now.  He came  to [place] without any significant psychiatric history.  Please refer to the  admission note for further details pertaining to the symptoms, circumstances  and history that led to his hospitalization.   INITIAL DIAGNOSTIC IMPRESSION:  He was given an initial AXIS I diagnosis of  polysubstance dependence.   MEDICAL/LABORATORY:  The patient was medically and physically assessed by  the psychiatric nurse practitioner upon admission.  He was hypertensive  through much of his stay and was placed on a regimen of clonidine 0.2 mg  t.i.d., as well as clonidine TTS patch, and hydrochlorothiazide 50 mg daily.  This appeared to adequately control his blood pressure during his inpatient  stay.  There were no other medical issues.   HOSPITAL COURSE:  The patient was admitted to the adult inpatient  psychiatric service.  He presented as a well-nourished, well-developed,  pleasant and polite gentleman who was fully oriented, not psychotic, with  well-organized thoughts and speech.  He did not display any signs or  symptoms of psychosis or delirium.  His mood and affect were appropriate to  the situation.  He verbalized strong desire to get help.   He was placed on a Librium  detoxification protocol.  During his hospital  stay, we learned that he had been on a regimen of Risperdal Consta as an  outpatient.  Risperdal Consta was given on his final hospital day, Apr 01, 2006, at a dose of 37.5 mg IM, to be repeated each 14 days.  Meanwhile, he  had been started on oral Risperdal 0.5 mg each a.m. and 1 mg q.h.s.   The patient did well with the above regimen, and his detoxification was  uneventful.  He was a good participant in the treatment program.  He felt  ready for discharge on the sixth hospital day, and looked forward to going  to outpatient rehabilitation services.   AFTERCARE:  The patient was to follow-up with [facility] on  Apr 02, 2006, the day following discharge.  He was given instructions as to  how to present himself for treatment at [facility] in  [place], [place].   DISCHARGE MEDICATIONS:  1.  Risperdal Consta 37.5 mg IM each 14 days, next due Apr 15, 2006.  2.  Risperdal 0.5 mg q.a.m. and 1 mg q.h.s.  3.  Clonidine  TTS patch, 1 weekly, next due Apr 05, 2006.  4.  Hydrochlorothiazide 50 mg daily.   FOLLOW UP:  The patient was instructed to follow-up with the [facility] in [facility] for monitoring of his blood pressure and its treatment.   DISCHARGE DIAGNOSES:  AXIS I:  Polysubstance dependence.  AXIS II:  Deferred.  AXIS III:  History of hypertension.  AXIS IV:  Stressors:  Severe.  AXIS V:  GAF on discharge 65.           ______________________________  Norm Salt, MD  Electronically Signed     SPB/MEDQ  D:  04/02/2006  T:  04/03/2006  Job:  [id number]

## 2011-08-12 LAB — CBC
HCT: 35.9 — ABNORMAL LOW
MCHC: 33.8
MCV: 87.8
RBC: 4.08 — ABNORMAL LOW

## 2011-08-12 LAB — I-STAT 8, (EC8 V) (CONVERTED LAB)
Bicarbonate: 27.7 — ABNORMAL HIGH
HCT: 42
Hemoglobin: 14.3
Operator id: 222501
Sodium: 139
TCO2: 29

## 2011-08-12 LAB — BASIC METABOLIC PANEL
BUN: 8
CO2: 30
Chloride: 104
Creatinine, Ser: 1.14
GFR calc Af Amer: >60

## 2011-08-12 LAB — CARDIAC PANEL(CRET KIN+CKTOT+MB+TROPI)
Relative Index: 0.9
Total CK: 121
Troponin I: 0.01
Troponin I: <0.01

## 2011-08-12 LAB — POCT I-STAT CREATININE: Operator id: 222501

## 2011-08-12 LAB — POCT CARDIAC MARKERS
CKMB, poc: <1 — ABNORMAL LOW
Operator id: 222501
Troponin i, poc: <0.05

## 2011-08-12 LAB — RAPID URINE DRUG SCREEN, HOSP PERFORMED: Tetrahydrocannabinol: NOT DETECTED

## 2011-08-20 LAB — CBC
MCHC: 34
Platelets: 78 — ABNORMAL LOW
RBC: 3.91 — ABNORMAL LOW
RDW: 13.9

## 2011-08-20 LAB — POCT CARDIAC MARKERS
CKMB, poc: <1 — ABNORMAL LOW
Myoglobin, poc: 60
Troponin i, poc: <0.05

## 2011-08-20 LAB — BASIC METABOLIC PANEL
BUN: 11
CO2: 27
Calcium: 9.3
Creatinine, Ser: 1
GFR calc Af Amer: >60

## 2011-08-20 LAB — DIFFERENTIAL
Basophils Absolute: 0
Basophils Relative: 0
Eosinophils Absolute: 0
Neutro Abs: 1.9
Neutrophils Relative %: 55

## 2011-08-22 LAB — CBC
HCT: 32.8 % — ABNORMAL LOW (ref 39.0–52.0)
Hemoglobin: 11.2 g/dL — ABNORMAL LOW (ref 13.0–17.0)
Hemoglobin: 11.4 g/dL — ABNORMAL LOW (ref 13.0–17.0)
Platelets: 77 10*3/uL — ABNORMAL LOW (ref 150–400)
RBC: 3.57 MIL/uL — ABNORMAL LOW (ref 4.22–5.81)
RBC: 3.68 MIL/uL — ABNORMAL LOW (ref 4.22–5.81)
WBC: 4.7 10*3/uL (ref 4.0–10.5)
WBC: 5.4 10*3/uL (ref 4.0–10.5)

## 2011-08-22 LAB — COMPREHENSIVE METABOLIC PANEL
ALT: 35 U/L (ref 0–53)
AST: 66 U/L — ABNORMAL HIGH (ref 0–37)
Alkaline Phosphatase: 78 U/L (ref 39–117)
CO2: 28 mEq/L (ref 19–32)
Chloride: 102 mEq/L (ref 96–112)
GFR calc Af Amer: >60 mL/min (ref >60)
GFR calc non Af Amer: >60 mL/min (ref >60)
Glucose, Bld: 177 mg/dL — ABNORMAL HIGH (ref 70–99)
Sodium: 138 mEq/L (ref 135–145)
Total Bilirubin: 0.4 mg/dL (ref 0.3–1.2)

## 2011-08-22 LAB — BASIC METABOLIC PANEL
BUN: 5 mg/dL — ABNORMAL LOW (ref 6–23)
GFR calc Af Amer: >60 mL/min (ref >60)
GFR calc non Af Amer: >60 mL/min (ref >60)
Potassium: 4 mEq/L (ref 3.5–5.1)
Sodium: 137 mEq/L (ref 135–145)

## 2011-08-22 LAB — CARDIAC PANEL(CRET KIN+CKTOT+MB+TROPI)
CK, MB: 1.5 ng/mL (ref 0.3–4.0)
CK, MB: 1.8 ng/mL (ref 0.3–4.0)
Relative Index: 1.2 (ref 0.0–2.5)
Total CK: 114 U/L (ref 7–232)

## 2011-08-23 LAB — BASIC METABOLIC PANEL
BUN: 18 mg/dL (ref 6–23)
Chloride: 102 mEq/L (ref 96–112)
Potassium: 4.3 mEq/L (ref 3.5–5.1)

## 2012-07-09 ENCOUNTER — Encounter (HOSPITAL_COMMUNITY): Payer: Self-pay | Admitting: *Deleted

## 2012-07-09 ENCOUNTER — Emergency Department (HOSPITAL_COMMUNITY): Payer: Medicaid Other

## 2012-07-09 ENCOUNTER — Inpatient Hospital Stay (HOSPITAL_COMMUNITY)
Admission: EM | Admit: 2012-07-09 | Discharge: 2012-07-13 | DRG: 066 | Payer: Medicaid Other | Attending: Internal Medicine | Admitting: Internal Medicine

## 2012-07-09 DIAGNOSIS — Z23 Encounter for immunization: Secondary | ICD-10-CM

## 2012-07-09 DIAGNOSIS — R4789 Other speech disturbances: Secondary | ICD-10-CM | POA: Diagnosis present

## 2012-07-09 DIAGNOSIS — I639 Cerebral infarction, unspecified: Secondary | ICD-10-CM | POA: Diagnosis present

## 2012-07-09 DIAGNOSIS — E119 Type 2 diabetes mellitus without complications: Secondary | ICD-10-CM | POA: Diagnosis present

## 2012-07-09 DIAGNOSIS — F17200 Nicotine dependence, unspecified, uncomplicated: Secondary | ICD-10-CM | POA: Diagnosis present

## 2012-07-09 DIAGNOSIS — R7309 Other abnormal glucose: Secondary | ICD-10-CM

## 2012-07-09 DIAGNOSIS — I10 Essential (primary) hypertension: Secondary | ICD-10-CM

## 2012-07-09 DIAGNOSIS — F1910 Other psychoactive substance abuse, uncomplicated: Secondary | ICD-10-CM | POA: Diagnosis present

## 2012-07-09 DIAGNOSIS — I6350 Cerebral infarction due to unspecified occlusion or stenosis of unspecified cerebral artery: Principal | ICD-10-CM

## 2012-07-09 DIAGNOSIS — Z79899 Other long term (current) drug therapy: Secondary | ICD-10-CM

## 2012-07-09 DIAGNOSIS — F1010 Alcohol abuse, uncomplicated: Secondary | ICD-10-CM | POA: Diagnosis present

## 2012-07-09 DIAGNOSIS — R739 Hyperglycemia, unspecified: Secondary | ICD-10-CM

## 2012-07-09 DIAGNOSIS — F1410 Cocaine abuse, uncomplicated: Secondary | ICD-10-CM | POA: Diagnosis present

## 2012-07-09 DIAGNOSIS — IMO0001 Reserved for inherently not codable concepts without codable children: Secondary | ICD-10-CM | POA: Diagnosis present

## 2012-07-09 HISTORY — DX: Other psychoactive substance abuse, uncomplicated: F19.10

## 2012-07-09 HISTORY — DX: Alcohol abuse, uncomplicated: F10.10

## 2012-07-09 LAB — COMPREHENSIVE METABOLIC PANEL
Alkaline Phosphatase: 87 U/L (ref 39–117)
BUN: 8 mg/dL (ref 6–23)
Calcium: 9.7 mg/dL (ref 8.4–10.5)
GFR calc Af Amer: >90 mL/min (ref >90)
GFR calc non Af Amer: 84 mL/min — ABNORMAL LOW (ref >90)
Glucose, Bld: 536 mg/dL — ABNORMAL HIGH (ref 70–99)
Total Protein: 7.7 g/dL (ref 6.0–8.3)

## 2012-07-09 LAB — CBC WITH DIFFERENTIAL/PLATELET
Eosinophils Absolute: 0 10*3/uL (ref 0.0–0.7)
Eosinophils Relative: 1 % (ref 0–5)
HCT: 35 % — ABNORMAL LOW (ref 39.0–52.0)
Hemoglobin: 11.4 g/dL — ABNORMAL LOW (ref 13.0–17.0)
Lymphs Abs: 1.7 10*3/uL (ref 0.7–4.0)
MCH: 29.8 pg (ref 26.0–34.0)
MCV: 91.4 fL (ref 78.0–100.0)
Monocytes Relative: 13 % — ABNORMAL HIGH (ref 3–12)
RBC: 3.83 MIL/uL — ABNORMAL LOW (ref 4.22–5.81)

## 2012-07-09 LAB — URINALYSIS, ROUTINE W REFLEX MICROSCOPIC
Glucose, UA: >1000 mg/dL — AB
Leukocytes, UA: NEGATIVE
Protein, ur: NEGATIVE mg/dL
Urobilinogen, UA: 0.2 mg/dL (ref 0.0–1.0)

## 2012-07-09 LAB — URINE MICROSCOPIC-ADD ON

## 2012-07-09 LAB — ETHANOL: Alcohol, Ethyl (B): <11 mg/dL (ref 0–11)

## 2012-07-09 LAB — GLUCOSE, CAPILLARY
Glucose-Capillary: 513 mg/dL — ABNORMAL HIGH (ref 70–99)
Glucose-Capillary: 326 mg/dL — ABNORMAL HIGH (ref 70–99)

## 2012-07-09 LAB — RAPID URINE DRUG SCREEN, HOSP PERFORMED: Opiates: NOT DETECTED

## 2012-07-09 LAB — KETONES, QUALITATIVE: Acetone, Bld: NEGATIVE

## 2012-07-09 MED ORDER — INSULIN ASPART 100 UNIT/ML ~~LOC~~ SOLN
10.0000 [IU] | Freq: Once | SUBCUTANEOUS | Status: AC
  Administered 2012-07-09: 10 [IU] via SUBCUTANEOUS
  Filled 2012-07-09: qty 1

## 2012-07-09 MED ORDER — GADOBENATE DIMEGLUMINE 529 MG/ML IV SOLN
20.0000 mL | Freq: Once | INTRAVENOUS | Status: AC
  Administered 2012-07-09: 20 mL via INTRAVENOUS

## 2012-07-09 MED ORDER — INSULIN ASPART 100 UNIT/ML ~~LOC~~ SOLN
0.0000 [IU] | SUBCUTANEOUS | Status: DC
  Administered 2012-07-09: 5 [IU] via SUBCUTANEOUS
  Filled 2012-07-09: qty 1

## 2012-07-09 MED ORDER — SODIUM CHLORIDE 0.9 % IV BOLUS (SEPSIS)
1000.0000 mL | Freq: Once | INTRAVENOUS | Status: AC
  Administered 2012-07-09: 1000 mL via INTRAVENOUS

## 2012-07-09 MED ORDER — ONDANSETRON HCL 4 MG PO TABS
4.0000 mg | ORAL_TABLET | Freq: Three times a day (TID) | ORAL | Status: DC | PRN
  Administered 2012-07-12: 4 mg via ORAL
  Filled 2012-07-09: qty 1

## 2012-07-09 MED ORDER — IBUPROFEN 600 MG PO TABS
600.0000 mg | ORAL_TABLET | Freq: Three times a day (TID) | ORAL | Status: DC | PRN
  Filled 2012-07-09: qty 1

## 2012-07-09 NOTE — ED Notes (Signed)
Patient transported to MRI 

## 2012-07-09 NOTE — ED Notes (Signed)
Last drugs Monday last alcohol sunday

## 2012-07-09 NOTE — ED Provider Notes (Signed)
History  This chart was scribed for Ezequiel Essex, MD by Jenne Campus. This patient was seen in room TR09C/TR09C and the patient's care was started at 5:40PM.  CSN: [id number]  Arrival date & time 07/09/12  1624   First MD Initiated Contact with Patient 07/09/12 1740      Chief Complaint  Patient presents with  . Medical Clearance     The history is provided by the patient. No language interpreter was used.    [name] is a 51 y.o. male with a h/o HTN and DM who presents to the Emergency Department complaining of HTN and hyperglycemia. His CBG level was 513 and his BP was 142/97 in triage. He states that he has been taking his diabetic pills as prescribed but denies being on insulin. He states that he is here for medical clearance to be accepted to [facility]. He reports that he uses crack/cocaine, last use being 4 days ago, and alcohol. He states that his normal alcohol consumption level is 4 cans a day and he reports that his last drink was 3 days ago. He also c/o HA but states that "it comes from the drugs". He denies SI or HI. He denies abdominal pian, CP, and SOB as associated symptoms. He is a current everyday smoker.   Past Medical History  Diagnosis Date  . Drug abuse   . Alcohol abuse   . Diabetes mellitus   . Hypertension   facial fractures per pt at bedside arthritis per pt at bedside  History reviewed. No pertinent past surgical history.  No family history on file.  History  Substance Use Topics  . Smoking status: Current Everyday Smoker  . Smokeless tobacco: Not on file  . Alcohol Use: Yes      Review of Systems  A complete 10 system review of systems was obtained and all systems are negative except as noted in the HPI and PMH.    Allergies  Review of patient's allergies indicates no known allergies.  Home Medications   Current Outpatient Rx  Name Route Sig Dispense Refill  . AMLODIPINE BESYLATE 10 MG PO TABS Oral Take 10 mg by mouth  daily.    Marland Kitchen CLONIDINE HCL 0.1 MG PO TABS Oral Take 0.1 mg by mouth 2 (two) times daily.    Marland Kitchen DICLOFENAC SODIUM 50 MG PO TBEC Oral Take 50 mg by mouth 2 (two) times daily.    Marland Kitchen GABAPENTIN 300 MG PO CAPS Oral Take 300 mg by mouth 2 (two) times daily.    Marland Kitchen HYDROCODONE-ACETAMINOPHEN 5-500 MG PO TABS Oral Take 1 tablet by mouth 3 (three) times daily.    Marland Kitchen LISINOPRIL 40 MG PO TABS Oral Take 40 mg by mouth daily.    Marland Kitchen METFORMIN HCL 500 MG PO TABS Oral Take 500 mg by mouth 2 (two) times daily with a meal.      Triage Vitals: BP 142/97  Pulse 96  Temp 98.6 F (37 C) (Oral)  Resp 16  SpO2 98%  Physical Exam  Nursing note and vitals reviewed. Constitutional: He is oriented to person, place, and time. He appears well-developed and well-nourished. No distress.  HENT:  Head: Normocephalic and atraumatic.  Eyes: Conjunctivae and EOM are normal.       No nsytagmus  Neck: Neck supple. No tracheal deviation present.  Cardiovascular: Normal rate and regular rhythm.   Pulmonary/Chest: Effort normal and breath sounds normal. No respiratory distress.  Abdominal: Soft. There is no tenderness.  Musculoskeletal: Normal range  of motion.  Neurological: He is alert and oriented to person, place, and time.       No pronator drift, no past pointing, slurred speech which pt states is baseline  Skin: Skin is warm and dry.  Psychiatric: He has a normal mood and affect. His behavior is normal.    ED Course  Procedures (including critical care time)  DIAGNOSTIC STUDIES: Oxygen Saturation is 98% on room air, normal by my interpretation.    COORDINATION OF CARE: 6:18PM-Discussed treatment plan with pt at bedside and pt agreed to plan.  6:30PM-Discussed pt's case with ACT and they agreed to evaluate the pt. Call ended at 6:35PM.  Labs Reviewed  GLUCOSE, CAPILLARY - Abnormal; Notable for the following:    Glucose-Capillary 513 (*)     All other components within normal limits  URINALYSIS, ROUTINE W REFLEX  MICROSCOPIC - Abnormal; Notable for the following:    Specific Gravity, Urine 1.036 (*)     Glucose, UA >1000 (*)     All other components within normal limits  CBC WITH DIFFERENTIAL - Abnormal; Notable for the following:    RBC 3.83 (*)     Hemoglobin 11.4 (*)     HCT 35.0 (*)     Platelets 107 (*)     Monocytes Relative 13 (*)     All other components within normal limits  COMPREHENSIVE METABOLIC PANEL - Abnormal; Notable for the following:    Glucose, Bld 536 (*)     AST 44 (*)     Total Bilirubin 0.2 (*)     GFR calc non Af Amer 84 (*)     All other components within normal limits  SALICYLATE LEVEL - Abnormal; Notable for the following:    Salicylate Lvl 123456 (*)     All other components within normal limits  ETHANOL  URINE MICROSCOPIC-ADD ON  KETONES, QUALITATIVE  ACETAMINOPHEN LEVEL  URINE RAPID DRUG SCREEN (HOSP PERFORMED)   Ct Head Wo Contrast  07/09/2012  *RADIOLOGY REPORT*  Clinical Data: Frontal headache.  Dizziness.  History of alcohol and drug abuse.  CT HEAD WITHOUT CONTRAST  Technique:  Contiguous axial images were obtained from the base of the skull through the vertex without contrast.  Comparison: None.  Findings: Mildly enlarged ventricles and subarachnoid spaces.  No intracranial hemorrhage, mass lesion or CT evidence of acute infarction.  Small amount of left cerebellar calcification. Aplastic frontal sinuses.  IMPRESSION:  1.  No acute abnormality. 2.  Mild atrophy. 3.  Small amount of left cerebellar calcification. This could be due to a previous infection.  Calcification associate with a vascular malformation is also a possibility.  There is no surrounding edema or mass effect to indicate calcification associated with a neoplasm.  A brain MR without and with contrast would be helpful for excluding a neoplasm without visible edema.   Original Report Authenticated By: Gerald Stabs, M.D.      No diagnosis found.    MDM  Hyperglycemia with request for alcohol  and drug detox. No nausea, vomiting, chest pain or shortness of breath. Denies SI or HI.  Hyperglycemia without evidence of DKA, anion gap 14, no ketones. Rest of fluid rehydration, insulin, and blood sugar monitoring before act team consult. MRI to evaluate CT head abnormality.  IVF, insulin. Recheck sugars, will need ACT consult.  D/w PA Gwenlyn Perking.  I personally performed the services described in this documentation, which was scribed in my presence.  The recorded information has been reviewed and  considered.       Ezequiel Essex, MD 07/09/12 2006

## 2012-07-09 NOTE — ED Notes (Signed)
CBG at triage is 513 mg/dL

## 2012-07-09 NOTE — ED Provider Notes (Signed)
51 y/o male moved to CDU to control blood glucose before moving to pod C. MRI showing acute stroke- Dr. Wyvonnia Dusky ordered medicine consult. Spoke with Dr. Broadus John who will admit patient. Dr. Broadus John asked for a neuro consult as well. Neuro consulted. 11:47 PM Spoke with Dr. Leonel Ramsay who is going to see patient before he is admitted.  Illene Labrador, PA-C 07/09/12 2348

## 2012-07-09 NOTE — ED Notes (Signed)
Pts CBG 326

## 2012-07-09 NOTE — ED Notes (Signed)
The pt was going to arka but his blood sugar and  Blood pressure was high.  He can barely keep his eyes open sluggish and speech slow.  ??? drugs

## 2012-07-10 ENCOUNTER — Inpatient Hospital Stay (HOSPITAL_COMMUNITY): Payer: Medicaid Other

## 2012-07-10 ENCOUNTER — Encounter (HOSPITAL_COMMUNITY): Payer: Self-pay | Admitting: *Deleted

## 2012-07-10 DIAGNOSIS — I6789 Other cerebrovascular disease: Secondary | ICD-10-CM

## 2012-07-10 LAB — GLUCOSE, CAPILLARY
Glucose-Capillary: 218 mg/dL — ABNORMAL HIGH (ref 70–99)
Glucose-Capillary: 300 mg/dL — ABNORMAL HIGH (ref 70–99)

## 2012-07-10 LAB — LIPID PANEL
Cholesterol: 115 mg/dL (ref 0–200)
HDL: 45 mg/dL (ref >39)
Total CHOL/HDL Ratio: 2.6 RATIO
Triglycerides: 181 mg/dL — ABNORMAL HIGH (ref <150)
VLDL: 36 mg/dL (ref 0–40)

## 2012-07-10 LAB — CREATININE, SERUM: Creatinine, Ser: 0.81 mg/dL (ref 0.50–1.35)

## 2012-07-10 LAB — CBC
MCH: 29.5 pg (ref 26.0–34.0)
MCHC: 32.3 g/dL (ref 30.0–36.0)
Platelets: 89 10*3/uL — ABNORMAL LOW (ref 150–400)
RBC: 3.9 MIL/uL — ABNORMAL LOW (ref 4.22–5.81)
RDW: 13.4 % (ref 11.5–15.5)

## 2012-07-10 MED ORDER — ENOXAPARIN SODIUM 30 MG/0.3ML ~~LOC~~ SOLN: 30.0000 mg | Freq: Two times a day (BID) | SUBCUTANEOUS | Status: DC

## 2012-07-10 MED ORDER — INSULIN ASPART 100 UNIT/ML ~~LOC~~ SOLN
0.0000 [IU] | Freq: Three times a day (TID) | SUBCUTANEOUS | Status: DC
  Administered 2012-07-10: 5 [IU] via SUBCUTANEOUS
  Administered 2012-07-10: 8 [IU] via SUBCUTANEOUS
  Administered 2012-07-10: 15 [IU] via SUBCUTANEOUS
  Administered 2012-07-11: 11 [IU] via SUBCUTANEOUS
  Administered 2012-07-11: 8 [IU] via SUBCUTANEOUS
  Administered 2012-07-11: 5 [IU] via SUBCUTANEOUS
  Administered 2012-07-12: 08:00:00 via SUBCUTANEOUS
  Administered 2012-07-12: 5 [IU] via SUBCUTANEOUS
  Administered 2012-07-12: 3 [IU] via SUBCUTANEOUS
  Administered 2012-07-13 (×2): 5 [IU] via SUBCUTANEOUS

## 2012-07-10 MED ORDER — AMLODIPINE BESYLATE 5 MG PO TABS
5.0000 mg | ORAL_TABLET | Freq: Every day | ORAL | Status: DC
  Administered 2012-07-10 – 2012-07-11 (×2): 5 mg via ORAL
  Filled 2012-07-10 (×3): qty 1

## 2012-07-10 MED ORDER — METFORMIN HCL 500 MG PO TABS
500.0000 mg | ORAL_TABLET | Freq: Two times a day (BID) | ORAL | Status: DC
  Administered 2012-07-10 – 2012-07-11 (×3): 500 mg via ORAL
  Filled 2012-07-10 (×5): qty 1

## 2012-07-10 MED ORDER — LORAZEPAM 1 MG PO TABS
1.0000 mg | ORAL_TABLET | Freq: Four times a day (QID) | ORAL | Status: AC | PRN
  Administered 2012-07-11 – 2012-07-12 (×6): 1 mg via ORAL
  Filled 2012-07-10 (×6): qty 1

## 2012-07-10 MED ORDER — AMLODIPINE BESYLATE 10 MG PO TABS
10.0000 mg | ORAL_TABLET | Freq: Every day | ORAL | Status: DC
  Filled 2012-07-10: qty 1

## 2012-07-10 MED ORDER — VITAMIN B-1 100 MG PO TABS
100.0000 mg | ORAL_TABLET | Freq: Every day | ORAL | Status: DC
  Administered 2012-07-10 – 2012-07-13 (×4): 100 mg via ORAL
  Filled 2012-07-10 (×4): qty 1

## 2012-07-10 MED ORDER — STROKE: EARLY STAGES OF RECOVERY BOOK
Freq: Once | Status: AC
  Administered 2012-07-10: 06:00:00
  Filled 2012-07-10: qty 1

## 2012-07-10 MED ORDER — LABETALOL HCL 5 MG/ML IV SOLN
10.0000 mg | Freq: Four times a day (QID) | INTRAVENOUS | Status: DC | PRN
  Administered 2012-07-11 (×3): 10 mg via INTRAVENOUS
  Filled 2012-07-10 (×3): qty 4

## 2012-07-10 MED ORDER — SENNOSIDES-DOCUSATE SODIUM 8.6-50 MG PO TABS
1.0000 | ORAL_TABLET | Freq: Every evening | ORAL | Status: DC | PRN
  Administered 2012-07-12: 1 via ORAL
  Filled 2012-07-10: qty 1

## 2012-07-10 MED ORDER — ASPIRIN 300 MG RE SUPP
300.0000 mg | Freq: Every day | RECTAL | Status: DC
  Administered 2012-07-13: 300 mg via RECTAL
  Filled 2012-07-10 (×3): qty 1

## 2012-07-10 MED ORDER — HYDRALAZINE HCL 20 MG/ML IJ SOLN
INTRAMUSCULAR | Status: AC
  Administered 2012-07-10: 10 mg via INTRAVENOUS
  Filled 2012-07-10: qty 1

## 2012-07-10 MED ORDER — VITAMIN B-1 100 MG PO TABS
100.0000 mg | ORAL_TABLET | Freq: Every day | ORAL | Status: DC
  Filled 2012-07-10: qty 1

## 2012-07-10 MED ORDER — ADULT MULTIVITAMIN W/MINERALS CH
1.0000 | ORAL_TABLET | Freq: Every day | ORAL | Status: DC
  Administered 2012-07-10 – 2012-07-13 (×4): 1 via ORAL
  Filled 2012-07-10 (×4): qty 1

## 2012-07-10 MED ORDER — CLONIDINE HCL 0.1 MG PO TABS
0.1000 mg | ORAL_TABLET | Freq: Two times a day (BID) | ORAL | Status: DC
  Filled 2012-07-10 (×2): qty 1

## 2012-07-10 MED ORDER — ENOXAPARIN SODIUM 40 MG/0.4ML ~~LOC~~ SOLN
40.0000 mg | SUBCUTANEOUS | Status: DC
  Administered 2012-07-11 – 2012-07-13 (×2): 40 mg via SUBCUTANEOUS
  Filled 2012-07-10 (×4): qty 0.4

## 2012-07-10 MED ORDER — GABAPENTIN 300 MG PO CAPS
300.0000 mg | ORAL_CAPSULE | Freq: Two times a day (BID) | ORAL | Status: DC
  Administered 2012-07-10 – 2012-07-11 (×3): 300 mg via ORAL
  Filled 2012-07-10 (×4): qty 1

## 2012-07-10 MED ORDER — HYDRALAZINE HCL 20 MG/ML IJ SOLN
10.0000 mg | Freq: Four times a day (QID) | INTRAMUSCULAR | Status: DC | PRN
  Administered 2012-07-10 – 2012-07-11 (×4): 10 mg via INTRAVENOUS
  Filled 2012-07-10 (×4): qty 1

## 2012-07-10 MED ORDER — LORAZEPAM 2 MG/ML IJ SOLN: 1.0000 mg | Freq: Four times a day (QID) | INTRAMUSCULAR | Status: AC | PRN

## 2012-07-10 MED ORDER — LISINOPRIL 10 MG PO TABS
10.0000 mg | ORAL_TABLET | Freq: Every day | ORAL | Status: DC
  Administered 2012-07-10 – 2012-07-11 (×2): 10 mg via ORAL
  Filled 2012-07-10 (×2): qty 1

## 2012-07-10 MED ORDER — ENOXAPARIN SODIUM 40 MG/0.4ML ~~LOC~~ SOLN
40.0000 mg | Freq: Once | SUBCUTANEOUS | Status: AC
  Administered 2012-07-10: 40 mg via SUBCUTANEOUS
  Filled 2012-07-10: qty 0.4

## 2012-07-10 MED ORDER — GLIPIZIDE 5 MG PO TABS
5.0000 mg | ORAL_TABLET | Freq: Two times a day (BID) | ORAL | Status: DC
  Administered 2012-07-11: 5 mg via ORAL
  Filled 2012-07-10 (×4): qty 1

## 2012-07-10 MED ORDER — THIAMINE HCL 100 MG/ML IJ SOLN
100.0000 mg | Freq: Every day | INTRAMUSCULAR | Status: DC
  Filled 2012-07-10 (×2): qty 1

## 2012-07-10 MED ORDER — LORAZEPAM 2 MG/ML IJ SOLN
1.0000 mg | Freq: Once | INTRAMUSCULAR | Status: AC
  Administered 2012-07-10: 1 mg via INTRAVENOUS

## 2012-07-10 MED ORDER — HYDRALAZINE HCL 20 MG/ML IJ SOLN
10.0000 mg | Freq: Once | INTRAMUSCULAR | Status: AC
  Administered 2012-07-10: 10 mg via INTRAVENOUS
  Filled 2012-07-10: qty 1

## 2012-07-10 MED ORDER — HYDROCODONE-ACETAMINOPHEN 5-325 MG PO TABS
1.0000 | ORAL_TABLET | Freq: Four times a day (QID) | ORAL | Status: DC | PRN
  Administered 2012-07-10 – 2012-07-11 (×4): 1 via ORAL
  Filled 2012-07-10 (×4): qty 1

## 2012-07-10 MED ORDER — FOLIC ACID 1 MG PO TABS
1.0000 mg | ORAL_TABLET | Freq: Every day | ORAL | Status: DC
  Administered 2012-07-10 – 2012-07-13 (×4): 1 mg via ORAL
  Filled 2012-07-10 (×4): qty 1

## 2012-07-10 MED ORDER — ASPIRIN 325 MG PO TABS
325.0000 mg | ORAL_TABLET | Freq: Every day | ORAL | Status: DC
  Administered 2012-07-10 – 2012-07-12 (×3): 325 mg via ORAL
  Filled 2012-07-10 (×4): qty 1

## 2012-07-10 MED ORDER — LISINOPRIL 40 MG PO TABS
40.0000 mg | ORAL_TABLET | Freq: Every day | ORAL | Status: DC
  Filled 2012-07-10: qty 1

## 2012-07-10 MED ORDER — LORAZEPAM 2 MG/ML IJ SOLN: 1.0000 mg | INTRAMUSCULAR | Status: DC | PRN

## 2012-07-10 MED ORDER — PNEUMOCOCCAL VAC POLYVALENT 25 MCG/0.5ML IJ INJ
0.5000 mL | INJECTION | INTRAMUSCULAR | Status: AC
  Administered 2012-07-11: 0.5 mL via INTRAMUSCULAR
  Filled 2012-07-10: qty 0.5

## 2012-07-10 MED ORDER — LORAZEPAM 2 MG/ML IJ SOLN
INTRAMUSCULAR | Status: AC
  Administered 2012-07-10: 1 mg via INTRAVENOUS
  Filled 2012-07-10: qty 1

## 2012-07-10 NOTE — Progress Notes (Signed)
Subjective: Feeling better today.  Denies any pain.  No other specific complaints.  Objective: Vital signs in last 24 hours: Filed Vitals:   07/10/12 0233 07/10/12 0400 07/10/12 0600 07/10/12 0630  BP: 168/119 151/107 165/122 160/119  Pulse: 82 101 103 91  Temp: 97.7 F (36.5 C)     TempSrc: Oral     Resp: 20 16 20 16   Height: 5\' 11"  (1.803 m)     Weight: 95.255 kg (210 lb)     SpO2: 100%      Weight change:   Intake/Output Summary (Last 24 hours) at 07/10/12 [id number] Last data filed at 07/09/12 2006  Gross per 24 hour  Intake   2000 ml  Output      0 ml  Net   2000 ml    Physical Exam: General: Awake, Oriented, No acute distress. HEENT: EOMI. Neck: Supple CV: S1 and S2 Lungs: Clear to ascultation bilaterally Abdomen: Soft, Nontender, Nondistended, +bowel sounds. Ext: Good pulses. Trace edema.  Lab Results: Basic Metabolic Panel:  Lab [id number] 0700 07/09/12 1749  NA -- 137  K -- 4.0  CL -- 99  CO2 -- 24  GLUCOSE -- 536*  BUN -- 8  CREATININE 0.81 1.03  CALCIUM -- 9.7  MG -- --  PHOS -- --   Liver Function Tests:  Lab 07/09/12 1749  AST 44*  ALT 29  ALKPHOS 87  BILITOT 0.2*  PROT 7.7  ALBUMIN 3.7   No results found for this basename: LIPASE:5,AMYLASE:5 in the last 168 hours No results found for this basename: AMMONIA:5 in the last 168 hours CBC:  Lab 07/10/12 0700 07/09/12 1749  WBC 4.7 4.8  NEUTROABS -- 2.4  HGB 11.5* 11.4*  HCT 35.6* 35.0*  MCV 91.3 91.4  PLT 89* 107*   Cardiac Enzymes: No results found for this basename: CKTOTAL:5,CKMB:5,CKMBINDEX:5,TROPONINI:5 in the last 168 hours BNP (last 3 results) No results found for this basename: PROBNP:3 in the last 8760 hours CBG:  Lab 07/10/12 0902 07/10/12 0236 07/09/12 2037 07/09/12 1648  GLUCAP 382* 268* 326* 513*   No results found for this basename: HGBA1C:5 in the last 72 hours Other Labs: No components found with this basename: POCBNP:3 No results found for this basename: DDIMER:2  in the last 168 hours  Lab 07/10/12 0700  CHOL 115  HDL 45  LDLCALC 34  TRIG 181*  CHOLHDL 2.6  LDLDIRECT --   No results found for this basename: TSH,T4TOTAL,FREET3,T3FREE,FREET4,THYROIDAB in the last 168 hours No results found for this basename: VITAMINB12:2,FOLATE:2,FERRITIN:2,TIBC:2,IRON:2,RETICCTPCT:2 in the last 168 hours  Micro Results: No results found for this or any previous visit (from the past 240 hour(s)).  Studies/Results: Ct Head Wo Contrast  07/09/2012  *RADIOLOGY REPORT*  Clinical Data: Frontal headache.  Dizziness.  History of alcohol and drug abuse.  CT HEAD WITHOUT CONTRAST  Technique:  Contiguous axial images were obtained from the base of the skull through the vertex without contrast.  Comparison: None.  Findings: Mildly enlarged ventricles and subarachnoid spaces.  No intracranial hemorrhage, mass lesion or CT evidence of acute infarction.  Small amount of left cerebellar calcification. Aplastic frontal sinuses.  IMPRESSION:  1.  No acute abnormality. 2.  Mild atrophy. 3.  Small amount of left cerebellar calcification. This could be due to a previous infection.  Calcification associate with a vascular malformation is also a possibility.  There is no surrounding edema or mass effect to indicate calcification associated with a neoplasm.  A brain MR without and  with contrast would be helpful for excluding a neoplasm without visible edema.   Original Report Authenticated By: Gerald Stabs, M.D.    Mr Jeri Cos Wo Contrast  07/09/2012  *RADIOLOGY REPORT*  Clinical Data: Medical clearance.  Headache.  Polysubstance abuse. Diabetes  MRI HEAD WITHOUT AND WITH CONTRAST  Technique:  Multiplanar, multiecho pulse sequences of the brain and surrounding structures were obtained according to standard protocol without and with intravenous contrast  Contrast: 7mL MULTIHANCE GADOBENATE DIMEGLUMINE 529 MG/ML IV SOLN  Comparison: CT 07/09/2012  Findings: Small focus of restricted diffusion in  the left anterior pons, most likely an area of acute infarct.  Chronic appearing infarct in the left pons centrally.  Mild chronic changes in the white matter.  Lobular calcification in the left medial cerebellum on the CT is best seen on gradient echo imaging.  There is mild surrounding hyperintensity on FLAIR.  This area does not show abnormal enhancement.  This appears to be a chronic abnormality such as prior hemorrhage or infection which has calcified.  In addition, numerous other areas of mineralization are present in the brain with a similar appearance. There is a 5 mm area of magnetic susceptibility in the right cerebellum, with other areas of susceptibility in the right basal ganglia, left temporal lobe, and right and left parietal lobes.  These are most likely areas of chronic hemorrhage.  Ventricle size is normal.  No midline shift.  Postcontrast imaging reveals no enhancing lesions.  IMPRESSION: Small area restricted diffusion left anterior pons compatible with acute infarct.  There is also a chronic infarct in the left mid pons.  Numerous areas of chronic susceptibility artifact in the brain, most compatible with prior hemorrhage.  The left cerebellar area is calcified on CT.  These may be due to chronic hypertension, prior trauma, endocarditis, or small vascular malformations.  No enhancing lesions are seen post contrast infusion.   Original Report Authenticated By: Truett Perna, M.D.     Medications: I have reviewed the patient's current medications. Scheduled Meds:   .  stroke: mapping our early stages of recovery book   Does not apply Once  . amLODipine  10 mg Oral Daily  . aspirin  300 mg Rectal Daily   Or  . aspirin  325 mg Oral Daily  . cloNIDine  0.1 mg Oral BID  . enoxaparin  40 mg Subcutaneous Q24H  . folic acid  1 mg Oral Daily  . gabapentin  300 mg Oral BID  . gadobenate dimeglumine  20 mL Intravenous Once  . hydrALAZINE  10 mg Intravenous Once  . hydrALAZINE  10 mg  Intravenous Once  . insulin aspart  0-15 Units Subcutaneous TID WC  . insulin aspart  10 Units Subcutaneous Once  . lisinopril  40 mg Oral Daily  . LORazepam  1 mg Intravenous Once  . metFORMIN  500 mg Oral BID WC  . multivitamin with minerals  1 tablet Oral Daily  . pneumococcal 23 valent vaccine  0.5 mL Intramuscular Tomorrow-1000  . sodium chloride  1,000 mL Intravenous Once  . thiamine  100 mg Oral Daily   Or  . thiamine  100 mg Intravenous Daily  . DISCONTD: enoxaparin  30 mg Subcutaneous Q12H  . DISCONTD: insulin aspart  0-9 Units Subcutaneous Q4H  . DISCONTD: thiamine  100 mg Oral Daily   Continuous Infusions:  PRN Meds:.HYDROcodone-acetaminophen, LORazepam, LORazepam, LORazepam, ondansetron, senna-docusate, DISCONTD: ibuprofen 2-D echocardiogram on 07/10/2012 Study Conclusions Left ventricle: The cavity size  was normal. Wall thickness was increased in a pattern of mild LVH. Systolic function was normal. The estimated ejection fraction was in the range of 55% to 60%. Wall motion was normal; there were no regional wall motion abnormalities. Doppler parameters are consistent with abnormal left ventricular relaxation (grade 1 diastolic dysfunction).   Assessment/Plan: Small left pontine infarct.  Appreciate neurology's input. Continue ASA 325 mg.  2-D echocardiogram done with results as indicated above.  Carotid Dopplers on 07/10/2012 showed no obvious evidence for internal carotid artery stenosis bilaterally.  No PT/OT needs.  Polysubstance abuse  For [facility] and alcohol abuse to [facility] on Monday. For alcohol abuse, continue thiamine and folic acid.  Patient on CIWA protocol, last consumption of alcohol was on 07/06/2012.  Counseled on cessation.  Uncontrolled diabetes 2 with complications Hemoglobin A1c 8.1. Continue metformin.  Start glipizide.  Continue sliding scale insulin.  Uncontrolled hypertension  Initially antihypertensive medications were held due to permissive  hypertension, restarted home antihypertensive medications with when necessary hydralazine and labetalol.    Prophylaxis Lovenox.  Code Status Full code.  Disposition Pending. Detox from alcohol once patient completes CIWA protocol.  Likely will plan on discharge on 07/13/2012.   LOS: 1 day  Maeley Matton A, MD 07/10/2012, 9:26 AM

## 2012-07-10 NOTE — Progress Notes (Signed)
UR Completed Vasil Juhasz Graves-Bigelow, RN,BSN [phone]  
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
## 2012-07-10 NOTE — ED Provider Notes (Signed)
Medical screening examination/treatment/procedure(s) were performed by non-physician practitioner and as supervising physician I was immediately available for consultation/collaboration.  Varney Biles, MD 07/10/12 [phone]

## 2012-07-10 NOTE — Progress Notes (Signed)
OT EVALUATION/ PT SIGN OFF  OT order received and patient screened with no acute PT needs at this time. If acute PT needs arise, PT will evaluate. Please refer to discharge planning section for recommendations for equipment. Thank you   Pt demonstrates dynamic standing balance with bath and sink level grooming during OT session. Pt without ACUTE PT needs. PT Marcie informed.  Sharol Harness [place]   OTR/L Pager: [phone] Office: [phone] .

## 2012-07-10 NOTE — Care Management Note (Signed)
    Page 1 of 1   07/13/2012     12:20:34 PM   CARE MANAGEMENT NOTE 07/13/2012  Patient:  [name], [name]   Account Number:  [id number]  Date Initiated:  07/10/2012  Documentation initiated by:  GRAVES-BIGELOW,Dimetri Armitage  Subjective/Objective Assessment:   Pt admitted for HA. CT scan of his head was done. Small left cerebellar calcification and therefore an MRI was done.  Pt had been at [facility]. CSW is working with pt for disposition needs.     Action/Plan:   Pt is eligible for zz fund for 3 day supply of meds. Per [facility] pt needs to have a 7 day supply I think. CM will f/u on Monday for medication assist.   Anticipated DC Date:  07/13/2012   Anticipated DC Plan:  IP REHAB FACILITY  In-house referral  Clinical Social Worker      DC Planning Services  CM consult  Medication Assistance      Choice offered to / List presented to:             Status of service:  Completed, signed off Medicare Important Message given?   (If response is "NO", the following Medicare IM given date fields will be blank) Date Medicare IM given:   Date Additional Medicare IM given:    Discharge Disposition:  IP REHAB FACILITY  Per UR Regulation:  Reviewed for med. necessity/level of care/duration of stay  If discussed at [place] of Stay Meetings, dates discussed:    Comments:  07-13-12 [place], RN,BSN [phone] Pt planned for [facility] today. Medications rx received for 14 day supply and will send to main pharmacy to be obtained. . No further needs from CM.

## 2012-07-10 NOTE — Progress Notes (Signed)
CSW attempted to assess patient for current substance abuse. Pt currently in xray. CSW will follow up later this afternoon. Per discussion with pt MD and nurse in progression, pt was at [facility] for detox but was admitted for medical clearance. .Clinical social worker continuing to follow pt to assist with pt dc plans and further csw needs.   Dorathy Kinsman, [employer] .07/10/2012 0141pm

## 2012-07-10 NOTE — Evaluation (Signed)
Occupational Therapy Evaluation Patient Details Name: [name] MRN: [id number] DOB: 09-Jul-1961 Today's Date: 07/10/2012 Time: [id number] OT Time Calculation (min): 35 min  OT Assessment / Plan / Recommendation Clinical Impression  51 yo male admitted for hyperglycemia and elevated BP. Pt currently at or near baseline. OT to sign off acutely    OT Assessment  Patient does not need any further OT services    Follow Up Recommendations  No OT follow up    Barriers to Discharge      Equipment Recommendations  None recommended by OT    Recommendations for Other Services    Frequency       Precautions / Restrictions Precautions Precautions: Fall   Pertinent Vitals/Pain 152/116 supine 160/113 standing 156/107 activity sink level 114/60 supine    ADL  Eating/Feeding: Performed;Modified independent Where Assessed - Eating/Feeding: Bed level Grooming: Performed;Wash/dry hands;Wash/dry face;Teeth care;Modified independent Where Assessed - Grooming: Unsupported standing (BP 156/107) Upper Body Bathing: Performed;Chest;Left arm;Right arm;Abdomen;Modified independent Where Assessed - Upper Body Bathing: Unsupported standing Toilet Transfer: Performed;Modified independent Toilet Transfer Method: Sit to Loss adjuster, chartered: Regular height toilet Toileting - Clothing Manipulation and Hygiene: Performed;Modified independent Where Assessed - Toileting Clothing Manipulation and Hygiene: Sit to stand from 3-in-1 or toilet ADL Comments: Pt completed bed mobility, sink level grooming, peri area hygiene in bathroom, completed toilet transfer, shaved face, alternating attention from sink level to staff entering room. Pt is at or near baseline. Pt does not require skilled OT    OT Diagnosis:    OT Problem List:   OT Treatment Interventions:     OT Goals    Visit Information  Last OT Received On: 07/10/12 Assistance Needed: +1    Subjective Data  Subjective:  "after they fixed my jaw, i can't feel my lips, the nerves are starting to come back, you dont know what I feel or been through" Patient Stated Goal: ato go to drug rehab   Prior Functioning  Vision/Perception  Home Living Lives With: Alone Type of Home: Apartment Home Access: Elevator Home Layout: One level (lives on the 4th floor) Bathroom Shower/Tub: Tub/shower unit;Curtain Biochemist, clinical: Standard Prior Function Level of Independence: Independent Able to Take Stairs?: Yes Driving: No Vocation: On disability (SSI - pt response) Communication Communication: No difficulties Dominant Hand: Right      Cognition  Overall Cognitive Status: Appears within functional limits for tasks assessed/performed Arousal/Alertness: Awake/alert Orientation Level: Appears intact for tasks assessed Behavior During Session: [facility] for tasks performed    Extremity/Trunk Assessment Right Upper Extremity Assessment RUE ROM/Strength/Tone: Within functional levels RUE Coordination: WFL - gross/fine motor Left Upper Extremity Assessment LUE ROM/Strength/Tone: Within functional levels LUE Sensation: WFL - Light Touch LUE Coordination: WFL - gross/fine motor Trunk Assessment Trunk Assessment: Normal   Mobility Bed Mobility Bed Mobility: Supine to Sit;Sit to Supine;Sitting - Scoot to Edge of Bed Supine to Sit: 7: Independent Sitting - Scoot to Edge of Bed: 7: Independent Sit to Supine: 7: Independent Transfers Transfers: Sit to Stand;Stand to Sit Sit to Stand: 7: Independent Stand to Sit: 7: Independent   Exercise    Balance    End of Session OT - End of Session Activity Tolerance: Patient tolerated treatment well Patient left: in bed;with call bell/phone within reach;Other (comment) (md present in room) Nurse Communication: Mobility status  GO     Sharol Harness [facility] 07/10/2012, 2:02 PM Pager: [phone]

## 2012-07-10 NOTE — H&P (Signed)
PCP: [facility]  [place], [place]   Chief Complaint:  Medical clearance  HPI: [name] is a 51 year old male with history of alcohol and crack or pain abuse presented to the ER for medical clearance for drug rehabilitation at [facility],  in triage he was noted to be hyperglycemic in the 500s and also complained of a headache and was noted to have some slurring of speech which prompted a head CT which was abnormal and hence an MRI was done, this showed an acute infarct in his left pons. Patient denies any weakness in his arms or legs, he reports some slurring of his speech for about 2 days now. He is a very poor historian as well. Last alcohol and crack cocaine use was about 4 days ago  Allergies:  No Known Allergies    Past Medical History  Diagnosis Date  . Drug abuse   . Alcohol abuse   . Diabetes mellitus   . Hypertension     History reviewed. No pertinent past surgical history.  Prior to Admission medications   Medication Sig Start Date End Date Taking? Authorizing Provider  amLODipine (NORVASC) 10 MG tablet Take 10 mg by mouth daily.   Yes Historical Provider, MD  cloNIDine (CATAPRES) 0.1 MG tablet Take 0.1 mg by mouth 2 (two) times daily.   Yes Historical Provider, MD  diclofenac (VOLTAREN) 50 MG EC tablet Take 50 mg by mouth 2 (two) times daily.   Yes Historical Provider, MD  gabapentin (NEURONTIN) 300 MG capsule Take 300 mg by mouth 2 (two) times daily.   Yes Historical Provider, MD  HYDROcodone-acetaminophen (VICODIN) 5-500 MG per tablet Take 1 tablet by mouth 3 (three) times daily.   Yes Historical Provider, MD  lisinopril (PRINIVIL,ZESTRIL) 40 MG tablet Take 40 mg by mouth daily.   Yes Historical Provider, MD  metFORMIN (GLUCOPHAGE) 500 MG tablet Take 500 mg by mouth 2 (two) times daily with a meal.   Yes Historical Provider, MD    Social History:  reports that he has been smoking.  He does not have any smokeless tobacco history on file. He reports that  he drinks alcohol. He reports that he uses illicit drugs (Cocaine and Methamphetamines).  No family history on file.  Review of Systems:  Constitutional: Denies fever, chills, diaphoresis, appetite change and fatigue.  HEENT: Denies photophobia, eye pain, redness, hearing loss, ear pain, congestion, sore throat, rhinorrhea, sneezing, mouth sores, trouble swallowing, neck pain, neck stiffness and tinnitus.   Respiratory: Denies SOB, DOE, cough, chest tightness,  and wheezing.   Cardiovascular: Denies chest pain, palpitations and leg swelling.  Gastrointestinal: Denies nausea, vomiting, abdominal pain, diarrhea, constipation, blood in stool and abdominal distention.  Genitourinary: Denies dysuria, urgency, frequency, hematuria, flank pain and difficulty urinating.  Musculoskeletal: Denies myalgias, back pain, joint swelling, arthralgias and gait problem.  Skin: Denies pallor, rash and wound.  Neurological: Denies dizziness, seizures, syncope, weakness, light-headedness, numbness and headaches.  Hematological: Denies adenopathy. Easy bruising, personal or family bleeding history  Psychiatric/Behavioral: Denies suicidal ideation, mood changes, confusion, nervousness, sleep disturbance and agitation   Physical Exam: Blood pressure 142/97, pulse 96, temperature 97.8 F (36.6 C), temperature source Oral, resp. rate 16, SpO2 98.00%. General alert awake oriented x3 no acute distress HEENT pupils equal reactive to light oral mucosa moist neck no JVD or lymphadenopathy CVS S1-S2 regular rate rhythm no murmurs rubs or gallops Lungs clear to auscultation bilaterally Abdomen soft nontender with normal bowel sounds no organomegaly Extremities no edema clubbing  or cyanosis Neuro motor 5 x 5 in all extremities Sensations light touch intact Deep tendon reflexes 2+ bilaterally Coordination no dysdiadochokinesia fingerintact Skin no rashes or skin breakdown  Labs on Admission:  Results for orders  placed during the hospital encounter of 07/09/12 (from the past 48 hour(s))  GLUCOSE, CAPILLARY     Status: Abnormal   Collection Time   07/09/12  4:48 PM      Component Value Range Comment   Glucose-Capillary 513 (*) 70 - 99 mg/dL    Comment 1 Documented in Chart      Comment 2 Notify RN     URINALYSIS, ROUTINE W REFLEX MICROSCOPIC     Status: Abnormal   Collection Time   07/09/12  5:26 PM      Component Value Range Comment   Color, Urine YELLOW  YELLOW    APPearance CLEAR  CLEAR    Specific Gravity, Urine 1.036 (*) 1.005 - 1.030    pH 5.5  5.0 - 8.0    Glucose, UA >1000 (*) NEGATIVE mg/dL    Hgb urine dipstick NEGATIVE  NEGATIVE    Bilirubin Urine NEGATIVE  NEGATIVE    Ketones, ur NEGATIVE  NEGATIVE mg/dL    Protein, ur NEGATIVE  NEGATIVE mg/dL    Urobilinogen, UA 0.2  0.0 - 1.0 mg/dL    Nitrite NEGATIVE  NEGATIVE    Leukocytes, UA NEGATIVE  NEGATIVE   URINE RAPID DRUG SCREEN (HOSP PERFORMED)     Status: Abnormal   Collection Time   07/09/12  5:26 PM      Component Value Range Comment   Opiates NONE DETECTED  NONE DETECTED    Cocaine NONE DETECTED  NONE DETECTED    Benzodiazepines POSITIVE (*) NONE DETECTED    Amphetamines NONE DETECTED  NONE DETECTED    Tetrahydrocannabinol NONE DETECTED  NONE DETECTED    Barbiturates NONE DETECTED  NONE DETECTED   URINE MICROSCOPIC-ADD ON     Status: Normal   Collection Time   07/09/12  5:26 PM      Component Value Range Comment   WBC, UA 0-2  <3 WBC/hpf    RBC / HPF 0-2  <3 RBC/hpf   CBC WITH DIFFERENTIAL     Status: Abnormal   Collection Time   07/09/12  5:49 PM      Component Value Range Comment   WBC 4.8  4.0 - 10.5 K/uL    RBC 3.83 (*) 4.22 - 5.81 MIL/uL    Hemoglobin 11.4 (*) 13.0 - 17.0 g/dL    HCT 35.0 (*) 39.0 - 52.0 %    MCV 91.4  78.0 - 100.0 fL    MCH 29.8  26.0 - 34.0 pg    MCHC 32.6  30.0 - 36.0 g/dL    RDW 13.3  11.5 - 15.5 %    Platelets 107 (*) 150 - 400 K/uL    Neutrophils Relative 50  43 - 77 %    Neutro Abs 2.4   1.7 - 7.7 K/uL    Lymphocytes Relative 36  12 - 46 %    Lymphs Abs 1.7  0.7 - 4.0 K/uL    Monocytes Relative 13 (*) 3 - 12 %    Monocytes Absolute 0.6  0.1 - 1.0 K/uL    Eosinophils Relative 1  0 - 5 %    Eosinophils Absolute 0.0  0.0 - 0.7 K/uL    Basophils Relative 0  0 - 1 %    Basophils Absolute 0.0  0.0 - 0.1 K/uL   COMPREHENSIVE METABOLIC PANEL     Status: Abnormal   Collection Time   07/09/12  5:49 PM      Component Value Range Comment   Sodium 137  135 - 145 mEq/L    Potassium 4.0  3.5 - 5.1 mEq/L    Chloride 99  96 - 112 mEq/L    CO2 24  19 - 32 mEq/L    Glucose, Bld 536 (*) 70 - 99 mg/dL    BUN 8  6 - 23 mg/dL    Creatinine, Ser 1.03  0.50 - 1.35 mg/dL    Calcium 9.7  8.4 - 10.5 mg/dL    Total Protein 7.7  6.0 - 8.3 g/dL    Albumin 3.7  3.5 - 5.2 g/dL    AST 44 (*) 0 - 37 U/L    ALT 29  0 - 53 U/L    Alkaline Phosphatase 87  39 - 117 U/L    Total Bilirubin 0.2 (*) 0.3 - 1.2 mg/dL    GFR calc non Af Amer 84 (*) >90 mL/min    GFR calc Af Amer >90  >90 mL/min   ETHANOL     Status: Normal   Collection Time   07/09/12  5:49 PM      Component Value Range Comment   Alcohol, Ethyl (B) <11  0 - 11 mg/dL   ACETAMINOPHEN LEVEL     Status: Normal   Collection Time   07/09/12  6:16 PM      Component Value Range Comment   Acetaminophen (Tylenol), Serum <15.0  10 - 30 ug/mL   SALICYLATE LEVEL     Status: Abnormal   Collection Time   07/09/12  6:16 PM      Component Value Range Comment   Salicylate Lvl 123456 (*) 2.8 - 20.0 mg/dL   KETONES, QUALITATIVE     Status: Normal   Collection Time   07/09/12  6:18 PM      Component Value Range Comment   Acetone, Bld NEGATIVE  NEGATIVE   GLUCOSE, CAPILLARY     Status: Abnormal   Collection Time   07/09/12  8:37 PM      Component Value Range Comment   Glucose-Capillary 326 (*) 70 - 99 mg/dL    Comment 1 Documented in Chart      Comment 2 Notify RN       Radiological Exams on Admission: Ct Head Wo Contrast  07/09/2012  *RADIOLOGY  REPORT*  Clinical Data: Frontal headache.  Dizziness.  History of alcohol and drug abuse.  CT HEAD WITHOUT CONTRAST  Technique:  Contiguous axial images were obtained from the base of the skull through the vertex without contrast.  Comparison: None.  Findings: Mildly enlarged ventricles and subarachnoid spaces.  No intracranial hemorrhage, mass lesion or CT evidence of acute infarction.  Small amount of left cerebellar calcification. Aplastic frontal sinuses.  IMPRESSION:  1.  No acute abnormality. 2.  Mild atrophy. 3.  Small amount of left cerebellar calcification. This could be due to a previous infection.  Calcification associate with a vascular malformation is also a possibility.  There is no surrounding edema or mass effect to indicate calcification associated with a neoplasm.  A brain MR without and with contrast would be helpful for excluding a neoplasm without visible edema.   Original Report Authenticated By: Gerald Stabs, M.D.    Mr Jeri Cos [id number] Contrast  07/09/2012  *RADIOLOGY REPORT*  Clinical Data:  Medical clearance.  Headache.  Polysubstance abuse. Diabetes  MRI HEAD WITHOUT AND WITH CONTRAST  Technique:  Multiplanar, multiecho pulse sequences of the brain and surrounding structures were obtained according to standard protocol without and with intravenous contrast  Contrast: 3mL MULTIHANCE GADOBENATE DIMEGLUMINE 529 MG/ML IV SOLN  Comparison: CT 07/09/2012  Findings: Small focus of restricted diffusion in the left anterior pons, most likely an area of acute infarct.  Chronic appearing infarct in the left pons centrally.  Mild chronic changes in the white matter.  Lobular calcification in the left medial cerebellum on the CT is best seen on gradient echo imaging.  There is mild surrounding hyperintensity on FLAIR.  This area does not show abnormal enhancement.  This appears to be a chronic abnormality such as prior hemorrhage or infection which has calcified.  In addition, numerous other areas of  mineralization are present in the brain with a similar appearance. There is a 5 mm area of magnetic susceptibility in the right cerebellum, with other areas of susceptibility in the right basal ganglia, left temporal lobe, and right and left parietal lobes.  These are most likely areas of chronic hemorrhage.  Ventricle size is normal.  No midline shift.  Postcontrast imaging reveals no enhancing lesions.  IMPRESSION: Small area restricted diffusion left anterior pons compatible with acute infarct.  There is also a chronic infarct in the left mid pons.  Numerous areas of chronic susceptibility artifact in the brain, most compatible with prior hemorrhage.  The left cerebellar area is calcified on CT.  These may be due to chronic hypertension, prior trauma, endocarditis, or small vascular malformations.  No enhancing lesions are seen post contrast infusion.   Original Report Authenticated By: Truett Perna, M.D.     Assessment/Plan  1. CVA (cerebral infarction) Admit to telemetry bed Check 2-D echo, carotid duplex Fasting lipid panel, hemoglobin A1c Aspirin 325 mg daily once passes swallow screen Physical and occupational therapy consult  2. Cocaine abuse Counseled, social work consult  3. Alcohol abuse: Thiamine, multivitamin  4. Uncontrolled diabetes: Check hemoglobin A1c, continue metformin, sliding scale insulin for now  5.  Hypertension: Hold antihypertensives to allow for permissive hypertension in the setting of acute CVA  DVT prophylaxis with Lovenox  Code CODE STATUS full    Time Spent on Admission: 86min  Patrycja Mumpower [facility] Pager: [phone] 07/10/2012, 12:57 AM

## 2012-07-10 NOTE — ED Notes (Signed)
Stroke Swallow Screen given and passed, NIH scale completed

## 2012-07-10 NOTE — Progress Notes (Signed)
*  PRELIMINARY RESULTS* Vascular Ultrasound Carotid Duplex (Doppler) has been completed.   No obvious evidence of internal carotid artery stenosis bilaterally. Bilateral antegrade vertebral artery flow.  07/10/2012 12:29 PM Maudry Mayhew, RDMS, RDCS

## 2012-07-10 NOTE — Progress Notes (Signed)
  Echocardiogram 2D Echocardiogram has been performed.  Mauricio Po 07/10/2012, 1:14 PM

## 2012-07-10 NOTE — Progress Notes (Signed)
Stroke Team Progress Note  HISTORY [name] is an 51 y.o. male with a history of crack cocaine abuse who presented 07/09/2012 for medical clearance to go to drug rehabilitation for crack cocaine. In triage it is noted that he was hyperglycemic to the 500s and was slurring his speech. He also reported a headache and therefore a CT scan of his head was done. This saw a small left cerebellar calcification and therefore an MRI was done.  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 He reports that his slurred speech got much worse 2 days ago, but denies any numbness or weakness. 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 Neurology was consulted given his acute infarct.  Patient was not a TPA candidate secondary to unknown time of onset. He was admitted for further evaluation and treatment.  SUBJECTIVE No family is at the bedside.  Overall he feels his condition is stable.   OBJECTIVE Most recent Vital Signs: Filed Vitals:   07/10/12 0233 07/10/12 0400 07/10/12 0600 07/10/12 0630  BP: 168/119 151/107 165/122 160/119  Pulse: 82 101 103 91  Temp: 97.7 F (36.5 C)     TempSrc: Oral     Resp: 20 16 20 16   Height: 5\' 11"  (1.803 m)     Weight: 95.255 kg (210 lb)     SpO2: 100%      CBG (last 3)   Basename 07/10/12 0902 07/10/12 0236 07/09/12 2037  GLUCAP 382* 268* 326*   Intake/Output from previous day: 08/22 0701 - 08/23 0700 In: 2000 [I.V.:2000] Out: -   IV Fluid Intake:     MEDICATIONS    .  stroke: mapping our early stages of recovery book   Does not apply Once  . amLODipine  10 mg Oral Daily  . aspirin  300 mg Rectal Daily   Or  . aspirin  325 mg Oral Daily  . cloNIDine  0.1 mg Oral BID  . enoxaparin  40 mg Subcutaneous Q24H  . folic acid  1 mg Oral Daily  . gabapentin  300 mg Oral BID  . gadobenate dimeglumine  20 mL Intravenous Once  . hydrALAZINE  10 mg Intravenous Once  . hydrALAZINE  10 mg Intravenous Once    . insulin aspart  0-15 Units Subcutaneous TID WC  . insulin aspart  10 Units Subcutaneous Once  . lisinopril  40 mg Oral Daily  . LORazepam  1 mg Intravenous Once  . metFORMIN  500 mg Oral BID WC  . multivitamin with minerals  1 tablet Oral Daily  . pneumococcal 23 valent vaccine  0.5 mL Intramuscular Tomorrow-1000  . sodium chloride  1,000 mL Intravenous Once  . thiamine  100 mg Oral Daily   Or  . thiamine  100 mg Intravenous Daily  . DISCONTD: enoxaparin  30 mg Subcutaneous Q12H  . DISCONTD: insulin aspart  0-9 Units Subcutaneous Q4H  . DISCONTD: thiamine  100 mg Oral Daily   PRN:  HYDROcodone-acetaminophen, LORazepam, LORazepam, LORazepam, ondansetron, senna-docusate, DISCONTD: ibuprofen  Diet:  Carb Control thin liquids Activity:  Out of Bed DVT Prophylaxis:  Lovenox 40 mg sq daily   CLINICALLY SIGNIFICANT STUDIES Basic Metabolic Panel:  Lab 123XX123 0700 07/09/12 1749  NA -- 137  K -- 4.0  CL -- 99  CO2 -- 24  GLUCOSE -- 536*  BUN -- 8  CREATININE 0.81 1.03  CALCIUM -- 9.7  MG -- --  PHOS -- --  Liver Function Tests:  Lab 07/09/12 1749  AST 44*  ALT 29  ALKPHOS 87  BILITOT 0.2*  PROT 7.7  ALBUMIN 3.7   CBC:  Lab 07/10/12 0700 07/09/12 1749  WBC 4.7 4.8  NEUTROABS -- 2.4  HGB 11.5* 11.4*  HCT 35.6* 35.0*  MCV 91.3 91.4  PLT 89* 107*   Urinalysis:  Lab 07/09/12 1726  COLORURINE YELLOW  LABSPEC 1.036*  PHURINE 5.5  GLUCOSEU >1000*  HGBUR NEGATIVE  BILIRUBINUR NEGATIVE  KETONESUR NEGATIVE  PROTEINUR NEGATIVE  UROBILINOGEN 0.2  NITRITE NEGATIVE  LEUKOCYTESUR NEGATIVE   Lipid Panel    Component Value Date/Time   CHOL 115 07/10/2012 0700   TRIG 181* 07/10/2012 0700   HDL 45 07/10/2012 0700   CHOLHDL 2.6 07/10/2012 0700   VLDL 36 07/10/2012 0700   LDLCALC 34 07/10/2012 0700   HgbA1C  Lab Results  Component Value Date   HGBA1C  Value: 5.4 (NOTE)   The ADA recommends the following therapeutic goal for glycemic   control related to Hgb A1C  measurement:   Goal of Therapy:   < 7.0% Hgb A1C   Reference: American Diabetes Association: Clinical Practice   Recommendations 2008, Diabetes Care,  2008, 31:(Suppl 1). 12/30/2008    Urine Drug Screen:     Component Value Date/Time   LABOPIA NONE DETECTED 07/09/2012 1726   COCAINSCRNUR NONE DETECTED 07/09/2012 1726   LABBENZ POSITIVE* 07/09/2012 1726   AMPHETMU NONE DETECTED 07/09/2012 1726   THCU NONE DETECTED 07/09/2012 1726   LABBARB NONE DETECTED 07/09/2012 1726    Alcohol Level:  Lab 07/09/12 [place] <11    CT of the brain  07/09/2012   1.  No acute abnormality. 2.  Mild atrophy. 3.  Small amount of left cerebellar calcification. This could be due to a previous infection.  Calcification associate with a vascular malformation is also a possibility.  There is no surrounding edema or mass effect to indicate calcification associated with a neoplasm.  A brain MR without and with contrast would be helpful for excluding a neoplasm without visible edema.     MRI of the brain  07/09/2012  Small area restricted diffusion left anterior pons compatible with acute infarct.  There is also a chronic infarct in the left mid pons.  Numerous areas of chronic susceptibility artifact in the brain, most compatible with prior hemorrhage.  The left cerebellar area is calcified on CT.  These may be due to chronic hypertension, prior trauma, endocarditis, or small vascular malformations.  No enhancing lesions are seen post contrast infusion.   MRA of the brain    2D Echocardiogram    Carotid Doppler    CXR    EKG  .   Therapy Recommendations PT - none; OT - ; ST -   Physical Exam   Pleasant young African [place] male not in distress.Awake alert. Afebrile. Head is nontraumatic. Neck is supple without bruit. Hearing is normal. Cardiac exam no murmur or gallop. Lungs are clear to auscultation. Distal pulses are well felt. Neurological Exam :  Awake alert oriented x 3 normal speech and language.fundi not  visualized. Visual fields adequate. Vision appears normal Mild right lower face asymmetry. Tongue midline. No drift. Mild diminished fine finger movements on right.. Orbits left over right upper extremity. Mild right grip weak.. Normal sensation .Impaired right finger to nose coordination. Gait deferred. ASSESSMENT [name] is a 51 y.o. male presenting with with diabetes, hypertension, crack cocaine use who presents with 2  days of slurred speech. 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 Infarct felt to be thrombolic secondary to small vessel disease. Stroke workup underway. On no antiplatlet prior to admission. Now on aspirin 325 mg orally every day for secondary stroke prevention.   Hospital day # 1  TREATMENT/PLAN -Continue aspirin 325 mg orally every day for secondary stroke prevention. -stop cocaine use -ongoing risk factor control -Follow up 2-D echocardiogram and carotid Doppler  Burnetta Sabin, MSN, RN, ANVP-BC, ANP-BC, GNP-BC [facility] Pager: [phone] 07/10/2012 10:00 AM  Scribe for Dr. Antony Contras, [place] Director, who has personally reviewed chart, pertinent data, examined the patient and developed the plan of care. Pager:  [phone]

## 2012-07-10 NOTE — Progress Notes (Signed)
Speech Language Pathology Patient Details Name: [name] MRN: [id number] DOB: 19-Mar-1961 Today's Date: 07/10/2012 Time:  -      Order for speech-language and cognitive assessment received.  To be completed next date.      Houston Siren 07/10/2012, 3:33 PM

## 2012-07-10 NOTE — Clinical Social Work Psychosocial (Signed)
     Clinical Social Work Department BRIEF PSYCHOSOCIAL ASSESSMENT 07/10/2012  Patient:  [name], [name]     Account Number:  [id number]     Admit date:  07/09/2012  Clinical Social Worker:  [employer]  Date/Time:  07/10/2012 02:57 PM  Referred by:  RN  Date Referred:  07/10/2012 Referred for  Substance Abuse   Other Referral:   Interview type:  Patient Other interview type:    PSYCHOSOCIAL DATA Living Status:  ALONE Admitted from facility:   Level of care:   Primary support name:  Tyrick Cesario Primary support relationship to patient:  FAMILY Degree of support available:   moderate    CURRENT CONCERNS Current Concerns  Post-Acute Placement  Substance Abuse   Other Concerns:    SOCIAL WORK ASSESSMENT / PLAN CSW met with pt at bedisde to discuss pt current substance abuse and assess for further csw needs. CSW introduced self and csw role. Pt shared he had gone to [facility] for substance abuse treatment when he felt feeling bad, and was admitted to [facility] for medical clearance.    Pt shared with csw pt motivation to continue to abstain from alcohol and seek substance abuse treatment. Pt stated he realizes the friends that are positive in his life and the friends that are negative. Pt states he wants to get away to abstain from the parties and drinking.    Pt shared he would like to return t [facility] when medically stable. CSW spoke with [facility] who stated that patient can return for a treatment bed once pt is fully detoxed. CSW was advised to call at 9am on monday morning to have patient a bed.      CSW completed SBIRT assessment and discussed other resources with pt for substance abuse treatment.   Assessment/plan status:  Psychosocial Support/Ongoing Assessment of Needs Other assessment/ plan:   Information/referral to community resources:   substnace abuse resources    PATIENTS/FAMILYS RESPONSE TO PLAN OF CARE: Pt plans to discharge to [facility] on Monday  once medically stable and completed detox. Per discussion with pt MD, pt is anticipated for dc on Monday. Pt thanked csw for concerna nd support.

## 2012-07-10 NOTE — Plan of Care (Signed)
Problem: Phase I Progression Outcomes Goal: Strict NPO til swallow screen done Outcome: Not Met (add Reason) Presented for medical clearance related to drug and alcohol rehabilitation.  Stroke symptoms, bedside swallow evaluation and workup initiated after patient had already had PO food and fluids in ED. Goal: Other Phase I Outcomes/Goals Outcome: Progressing Diabetes management, see problem goals.

## 2012-07-10 NOTE — Progress Notes (Signed)
PT Discharge  Spoke with OT, Brynn regarding pt's balance and functional mobility. Pt is at baseline with no higher level balance deficits. Signing off from all PT services. Please reorder if necessary.   07/10/2012 Ambrose Finland DPT PAGER: [phone] OFFICE: [phone]

## 2012-07-11 DIAGNOSIS — I10 Essential (primary) hypertension: Secondary | ICD-10-CM

## 2012-07-11 DIAGNOSIS — F1910 Other psychoactive substance abuse, uncomplicated: Secondary | ICD-10-CM

## 2012-07-11 LAB — GLUCOSE, CAPILLARY: Glucose-Capillary: 311 mg/dL — ABNORMAL HIGH (ref 70–99)

## 2012-07-11 MED ORDER — LISINOPRIL 10 MG PO TABS
10.0000 mg | ORAL_TABLET | Freq: Once | ORAL | Status: AC
  Administered 2012-07-11: 10 mg via ORAL
  Filled 2012-07-11: qty 1

## 2012-07-11 MED ORDER — GABAPENTIN 300 MG PO CAPS
300.0000 mg | ORAL_CAPSULE | Freq: Three times a day (TID) | ORAL | Status: DC
  Administered 2012-07-11 – 2012-07-13 (×6): 300 mg via ORAL
  Filled 2012-07-11 (×8): qty 1

## 2012-07-11 MED ORDER — AMLODIPINE BESYLATE 5 MG PO TABS
5.0000 mg | ORAL_TABLET | Freq: Once | ORAL | Status: AC
  Administered 2012-07-11: 5 mg via ORAL
  Filled 2012-07-11: qty 1

## 2012-07-11 MED ORDER — OXYCODONE-ACETAMINOPHEN 5-325 MG PO TABS
0.5000 | ORAL_TABLET | Freq: Four times a day (QID) | ORAL | Status: DC | PRN
  Administered 2012-07-11 – 2012-07-13 (×7): 1 via ORAL
  Filled 2012-07-11 (×9): qty 1

## 2012-07-11 MED ORDER — GLIPIZIDE 10 MG PO TABS
10.0000 mg | ORAL_TABLET | Freq: Two times a day (BID) | ORAL | Status: DC
  Administered 2012-07-11 – 2012-07-13 (×4): 10 mg via ORAL
  Filled 2012-07-11 (×6): qty 1

## 2012-07-11 MED ORDER — LISINOPRIL 20 MG PO TABS
20.0000 mg | ORAL_TABLET | Freq: Every day | ORAL | Status: DC
  Administered 2012-07-12: 20 mg via ORAL
  Filled 2012-07-11: qty 1

## 2012-07-11 MED ORDER — ACETAMINOPHEN 325 MG PO TABS
650.0000 mg | ORAL_TABLET | Freq: Four times a day (QID) | ORAL | Status: DC | PRN
  Administered 2012-07-11 – 2012-07-13 (×5): 650 mg via ORAL
  Filled 2012-07-11 (×5): qty 2

## 2012-07-11 MED ORDER — AMLODIPINE BESYLATE 10 MG PO TABS
10.0000 mg | ORAL_TABLET | Freq: Every day | ORAL | Status: DC
  Administered 2012-07-12 – 2012-07-13 (×2): 10 mg via ORAL
  Filled 2012-07-11 (×2): qty 1

## 2012-07-11 MED ORDER — METFORMIN HCL 850 MG PO TABS
850.0000 mg | ORAL_TABLET | Freq: Two times a day (BID) | ORAL | Status: DC
  Administered 2012-07-11 – 2012-07-12 (×2): 850 mg via ORAL
  Filled 2012-07-11 (×4): qty 1

## 2012-07-11 NOTE — Progress Notes (Signed)
Stroke Team Progress Note  HISTORY   [name] is an 51 y.o. male with a history of crack cocaine abuse who presented 07/09/2012 for medical clearance to go to drug rehabilitation for crack cocaine. In triage it is noted that he was hyperglycemic to the 500s and was slurring his speech. He also reported a headache and therefore a CT scan of his head was done. This saw a small left cerebellar calcification and therefore an MRI was done.   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 He reports that his slurred speech got much worse 2 days ago, but denies any numbness or weakness. 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 Neurology was consulted given his acute infarct.  Patient was not a TPA candidate secondary to unknown time of onset. He was admitted for further evaluation and treatment.  ECHO; EF [id number], grade I diastolic dysfunction.  US carotid, no significant stenosis.   SUBJECTIVE No family is at the bedside.  He complains of pain all over, want more percocet  OBJECTIVE Most recent Vital Signs: Filed Vitals:   07/10/12 2200 07/11/12 0200 07/11/12 0300 07/11/12 0600  BP: 159/108 181/137 156/95 159/116  Pulse: 101 111  116  Temp: 98.3 F (36.8 C) 97.7 F (36.5 C)  98.6 F (37 C)  TempSrc:  Oral  Oral  Resp: 17 18    Height:      Weight:      SpO2: 100% 99%  99%   CBG (last 3)   Basename 07/11/12 0732 07/10/12 2119 07/10/12 1630  GLUCAP 265* 300* 218*   Intake/Output from previous day: 08/23 0701 - 08/24 0700 In: 360 [P.O.:360] Out: 300 [Urine:300]  IV Fluid Intake:     MEDICATIONS     . amLODipine  5 mg Oral Daily  . aspirin  300 mg Rectal Daily   Or  . aspirin  325 mg Oral Daily  . enoxaparin  40 mg Subcutaneous Q24H  . enoxaparin (LOVENOX) injection  40 mg Subcutaneous Once  . folic acid  1 mg Oral Daily  . gabapentin  300 mg Oral BID  . glipiZIDE  5 mg Oral BID AC  . insulin aspart  0-15 Units  Subcutaneous TID WC  . lisinopril  10 mg Oral Daily  . metFORMIN  500 mg Oral BID WC  . multivitamin with minerals  1 tablet Oral Daily  . pneumococcal 23 valent vaccine  0.5 mL Intramuscular Tomorrow-1000  . thiamine  100 mg Oral Daily  . DISCONTD: amLODipine  10 mg Oral Daily  . DISCONTD: cloNIDine  0.1 mg Oral BID  . DISCONTD: lisinopril  40 mg Oral Daily  . DISCONTD: thiamine  100 mg Intravenous Daily   PRN:  hydrALAZINE, HYDROcodone-acetaminophen, labetalol, LORazepam, LORazepam, LORazepam, ondansetron, senna-docusate  Diet:  Carb Control thin liquids Activity:  Out of Bed DVT Prophylaxis:  Lovenox 40 mg sq daily   CLINICALLY SIGNIFICANT STUDIES Basic Metabolic Panel:   Lab 123XX123 0700 07/09/12 1749  NA -- 137  K -- 4.0  CL -- 99  CO2 -- 24  GLUCOSE -- 536*  BUN -- 8  CREATININE 0.81 1.03  CALCIUM -- 9.7  MG -- --  PHOS -- --   Liver Function Tests:   Lab 07/09/12 1749  AST 44*  ALT 29  ALKPHOS 87  BILITOT 0.2*  PROT 7.7  ALBUMIN 3.7   CBC:   Lab 07/10/12 0700 07/09/12 1749  WBC  4.7 4.8  NEUTROABS -- 2.4  HGB 11.5* 11.4*  HCT 35.6* 35.0*  MCV 91.3 91.4  PLT 89* 107*   Urinalysis:   Lab 07/09/12 1726  COLORURINE YELLOW  LABSPEC 1.036*  PHURINE 5.5  GLUCOSEU >1000*  HGBUR NEGATIVE  BILIRUBINUR NEGATIVE  KETONESUR NEGATIVE  PROTEINUR NEGATIVE  UROBILINOGEN 0.2  NITRITE NEGATIVE  LEUKOCYTESUR NEGATIVE   Lipid Panel    Component Value Date/Time   CHOL 115 07/10/2012 0700   TRIG 181* 07/10/2012 0700   HDL 45 07/10/2012 0700   CHOLHDL 2.6 07/10/2012 0700   VLDL 36 07/10/2012 0700   LDLCALC 34 07/10/2012 0700   HgbA1C  Lab Results  Component Value Date   HGBA1C 8.1* 07/10/2012    Urine Drug Screen:     Component Value Date/Time   LABOPIA NONE DETECTED 07/09/2012 1726   COCAINSCRNUR NONE DETECTED 07/09/2012 1726   LABBENZ POSITIVE* 07/09/2012 1726   AMPHETMU NONE DETECTED 07/09/2012 1726   THCU NONE DETECTED 07/09/2012 1726   LABBARB NONE  DETECTED 07/09/2012 1726    Alcohol Level:   Lab 07/09/12 [place] <11    CT of the brain  07/09/2012   1.  No acute abnormality. 2.  Mild atrophy. 3.  Small amount of left cerebellar calcification. This could be due to a previous infection.  Calcification associate with a vascular malformation is also a possibility.  There is no surrounding edema or mass effect to indicate calcification associated with a neoplasm.  A brain MR without and with contrast would be helpful for excluding a neoplasm without visible edema.     MRI of the brain  07/09/2012  Small area restricted diffusion left anterior pons compatible with acute infarct.  There is also a chronic infarct in the left mid pons.  Numerous areas of chronic susceptibility artifact in the brain, most compatible with prior hemorrhage.  The left cerebellar area is calcified on CT.  These may be due to chronic hypertension, prior trauma, endocarditis, or small vascular malformations.  No enhancing lesions are seen post contrast infusion.   Therapy Recommendations PT - none; OT - ; ST -   Physical Exam   Depressed looking young African [place] male not in distress.Awake alert. Afebrile. Head is nontraumatic. Neck is supple without bruit. Hearing is normal. Cardiac exam no murmur or gallop. Lungs are clear to auscultation. Distal pulses are well felt. Neurological Exam :  Awake alert oriented x 3, mild dysarthria.. Visual fields adequate. Vision appears normal Mild right lower face asymmetry. Tongue midline. No drift. Mild diminished fine finger movements on right.. Orbits left over right upper extremity. Mild right grip weak.. Normal sensation .Impaired right finger to nose coordination. Mild dragging right leg, decreased right arm swing.  ASSESSMENT [name] is a 51 y.o. male presenting with with diabetes, hypertension, crack cocaine use who presents with 2 days of slurred speech. 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  Infarct felt to be thrombolic secondary to small vessel disease. Stroke workup completed, On no antiplatlet prior to admission. Now on aspirin 325 mg orally every day for secondary stroke prevention.   Hospital day # 2  TREATMENT/PLAN -Continue aspirin 325 mg orally every day for secondary stroke prevention. -stop cocaine use -ongoing risk factor control - neurology will sign off.

## 2012-07-11 NOTE — Progress Notes (Signed)
  RD consulted for nutrition education regarding diabetes.   Lab Results  Component Value Date   HGBA1C 8.1* 07/10/2012    RD provided "Carbohydrate Counting for People with Diabetes" handout from the Academy of Nutrition and Dietetics. Discussed different food groups and their effects on blood sugar, emphasizing carbohydrate-containing foods. Provided list of carbohydrates and recommended serving sizes of common foods.  Discussed importance of controlled and consistent carbohydrate intake throughout the day. Provided examples of ways to balance meals/snacks and encouraged intake of high-fiber, whole grain complex carbohydrates.  Expect Fair compliance, given that pt will have change diet as well as social habits  Body mass index is 29.29 kg/(m^2).   Current diet order is carbohydrate modified medium, patient is consuming approximately 100% of meals at this time. Labs and medications reviewed. CBG (last 3)   Basename 07/11/12 1313 07/11/12 0732 07/10/12 2119  GLUCAP 311* 265* 300*     No further nutrition interventions warranted at this time. RD contact information provided. If additional nutrition issues arise, please re-consult RD.  Weyman Rodney M.Fredderick Severance LDN Neonatal Nutrition Support Specialist

## 2012-07-11 NOTE — Progress Notes (Signed)
Subjective: Feeling better today, except complaining of generalized aches and pains.  Objective: Vital signs in last 24 hours: Filed Vitals:   07/11/12 1025 07/11/12 1200 07/11/12 1258 07/11/12 1302  BP: 165/125 171/131 163/109 150/120  Pulse: 105 98 98   Temp:   97.9 F (36.6 C)   TempSrc:   Oral   Resp:      Height:      Weight:      SpO2:   99%    Weight change:   Intake/Output Summary (Last 24 hours) at 07/11/12 1330 Last data filed at 07/11/12 1300  Gross per 24 hour  Intake    840 ml  Output    300 ml  Net    540 ml    Physical Exam: General: Awake, Oriented, No acute distress. HEENT: EOMI. Neck: Supple CV: S1 and S2 Lungs: Clear to ascultation bilaterally Abdomen: Soft, Nontender, Nondistended, +bowel sounds. Ext: Good pulses. Trace edema.  Lab Results: Basic Metabolic Panel:  Lab 123XX123 0700 07/09/12 1749  NA -- 137  K -- 4.0  CL -- 99  CO2 -- 24  GLUCOSE -- 536*  BUN -- 8  CREATININE 0.81 1.03  CALCIUM -- 9.7  MG -- --  PHOS -- --   Liver Function Tests:  Lab 07/09/12 1749  AST 44*  ALT 29  ALKPHOS 87  BILITOT 0.2*  PROT 7.7  ALBUMIN 3.7   No results found for this basename: LIPASE:5,AMYLASE:5 in the last 168 hours No results found for this basename: AMMONIA:5 in the last 168 hours CBC:  Lab 07/10/12 0700 07/09/12 1749  WBC 4.7 4.8  NEUTROABS -- 2.4  HGB 11.5* 11.4*  HCT 35.6* 35.0*  MCV 91.3 91.4  PLT 89* 107*   Cardiac Enzymes: No results found for this basename: CKTOTAL:5,CKMB:5,CKMBINDEX:5,TROPONINI:5 in the last 168 hours BNP (last 3 results) No results found for this basename: PROBNP:3 in the last 8760 hours CBG:  Lab 07/11/12 1313 07/11/12 0732 07/10/12 2119 07/10/12 1630 07/10/12 1159  GLUCAP 311* 265* 300* 218* 285*    Basename 07/10/12 0700  HGBA1C 8.1*   Other Labs: No components found with this basename: POCBNP:3 No results found for this basename: DDIMER:2 in the last 168 hours  Lab 07/10/12 0700  CHOL  115  HDL 45  LDLCALC 34  TRIG 181*  CHOLHDL 2.6  LDLDIRECT --   No results found for this basename: TSH,T4TOTAL,FREET3,T3FREE,FREET4,THYROIDAB in the last 168 hours No results found for this basename: VITAMINB12:2,FOLATE:2,FERRITIN:2,TIBC:2,IRON:2,RETICCTPCT:2 in the last 168 hours  Micro Results: No results found for this or any previous visit (from the past 240 hour(s)).  Studies/Results: Dg Chest 2 View  07/10/2012  *RADIOLOGY REPORT*  Clinical Data: Shortness of breath, diabetes, post stroke  CHEST - 2 VIEW  Comparison: 07/26/2009  Findings: Normal heart size, mediastinal contours, and pulmonary vascularity. Lungs clear. No pleural effusion or pneumothorax. Bones unremarkable.  IMPRESSION: No acute abnormalities.   Original Report Authenticated By: Burnetta Sabin, M.D.    Ct Head Wo Contrast  07/09/2012  *RADIOLOGY REPORT*  Clinical Data: Frontal headache.  Dizziness.  History of alcohol and drug abuse.  CT HEAD WITHOUT CONTRAST  Technique:  Contiguous axial images were obtained from the base of the skull through the vertex without contrast.  Comparison: None.  Findings: Mildly enlarged ventricles and subarachnoid spaces.  No intracranial hemorrhage, mass lesion or CT evidence of acute infarction.  Small amount of left cerebellar calcification. Aplastic frontal sinuses.  IMPRESSION:  1.  No  acute abnormality. 2.  Mild atrophy. 3.  Small amount of left cerebellar calcification. This could be due to a previous infection.  Calcification associate with a vascular malformation is also a possibility.  There is no surrounding edema or mass effect to indicate calcification associated with a neoplasm.  A brain MR without and with contrast would be helpful for excluding a neoplasm without visible edema.   Original Report Authenticated By: Gerald Stabs, M.D.    Mr Jeri Cos Wo Contrast  07/09/2012  *RADIOLOGY REPORT*  Clinical Data: Medical clearance.  Headache.  Polysubstance abuse. Diabetes  MRI HEAD  WITHOUT AND WITH CONTRAST  Technique:  Multiplanar, multiecho pulse sequences of the brain and surrounding structures were obtained according to standard protocol without and with intravenous contrast  Contrast: 51mL MULTIHANCE GADOBENATE DIMEGLUMINE 529 MG/ML IV SOLN  Comparison: CT 07/09/2012  Findings: Small focus of restricted diffusion in the left anterior pons, most likely an area of acute infarct.  Chronic appearing infarct in the left pons centrally.  Mild chronic changes in the white matter.  Lobular calcification in the left medial cerebellum on the CT is best seen on gradient echo imaging.  There is mild surrounding hyperintensity on FLAIR.  This area does not show abnormal enhancement.  This appears to be a chronic abnormality such as prior hemorrhage or infection which has calcified.  In addition, numerous other areas of mineralization are present in the brain with a similar appearance. There is a 5 mm area of magnetic susceptibility in the right cerebellum, with other areas of susceptibility in the right basal ganglia, left temporal lobe, and right and left parietal lobes.  These are most likely areas of chronic hemorrhage.  Ventricle size is normal.  No midline shift.  Postcontrast imaging reveals no enhancing lesions.  IMPRESSION: Small area restricted diffusion left anterior pons compatible with acute infarct.  There is also a chronic infarct in the left mid pons.  Numerous areas of chronic susceptibility artifact in the brain, most compatible with prior hemorrhage.  The left cerebellar area is calcified on CT.  These may be due to chronic hypertension, prior trauma, endocarditis, or small vascular malformations.  No enhancing lesions are seen post contrast infusion.   Original Report Authenticated By: Truett Perna, M.D.     Medications: I have reviewed the patient's current medications. Scheduled Meds:    . amLODipine  5 mg Oral Daily  . aspirin  300 mg Rectal Daily   Or  . aspirin  325  mg Oral Daily  . enoxaparin  40 mg Subcutaneous Q24H  . folic acid  1 mg Oral Daily  . gabapentin  300 mg Oral BID  . glipiZIDE  10 mg Oral BID AC  . insulin aspart  0-15 Units Subcutaneous TID WC  . lisinopril  10 mg Oral Daily  . metFORMIN  500 mg Oral BID WC  . multivitamin with minerals  1 tablet Oral Daily  . pneumococcal 23 valent vaccine  0.5 mL Intramuscular Tomorrow-1000  . thiamine  100 mg Oral Daily  . DISCONTD: glipiZIDE  5 mg Oral BID AC  . DISCONTD: thiamine  100 mg Intravenous Daily   Continuous Infusions:  PRN Meds:.hydrALAZINE, labetalol, LORazepam, LORazepam, LORazepam, ondansetron, oxyCODONE-acetaminophen, senna-docusate, DISCONTD: HYDROcodone-acetaminophen  2-D echocardiogram on 07/10/2012 Study Conclusions Left ventricle: The cavity size was normal. Wall thickness was increased in a pattern of mild LVH. Systolic function was normal. The estimated ejection fraction was in the range of 55% to 60%.  Wall motion was normal; there were no regional wall motion abnormalities. Doppler parameters are consistent with abnormal left ventricular relaxation (grade 1 diastolic dysfunction).   Assessment/Plan: Small left pontine infarct.  Continue ASA 325 mg.  2-D echocardiogram done with results as indicated above.  Carotid Dopplers on 07/10/2012 showed no obvious evidence for internal carotid artery stenosis bilaterally.  LDL 34, no need for statin. No PT/OT needs.  Polysubstance abuse  For cocaine and alcohol abuse to [facility] on Monday. For alcohol abuse, continue thiamine and folic acid.  Patient on CIWA protocol, last consumption of alcohol was on 07/06/2012.  Counseled on cessation.  Uncontrolled diabetes 2 with complications Not well controlled. Hemoglobin A1c 8.1. Increase the dose of metformin and glipizide.  Continue sliding scale insulin.  Uncontrolled hypertension  Still not well controlled.  Increase dose of amlodipine to 10 mg daily, increase lisinopril to 20 mg daily.   Continue to monitor with parent hydralazine and labetalol.    Prophylaxis Lovenox.  Code Status Full code.  Disposition Pending. Detox from alcohol once patient completes CIWA protocol.  Likely will plan on discharge on 07/13/2012.   LOS: 2 days  [name] A, MD 07/11/2012, 1:30 PM

## 2012-07-12 LAB — GLUCOSE, CAPILLARY
Glucose-Capillary: 216 mg/dL — ABNORMAL HIGH (ref 70–99)
Glucose-Capillary: 234 mg/dL — ABNORMAL HIGH (ref 70–99)
Glucose-Capillary: 248 mg/dL — ABNORMAL HIGH (ref 70–99)
Glucose-Capillary: 154 mg/dL — ABNORMAL HIGH (ref 70–99)

## 2012-07-12 MED ORDER — LISINOPRIL 20 MG PO TABS
20.0000 mg | ORAL_TABLET | Freq: Once | ORAL | Status: AC
  Administered 2012-07-12: 20 mg via ORAL
  Filled 2012-07-12: qty 1

## 2012-07-12 MED ORDER — METFORMIN HCL 500 MG PO TABS
1000.0000 mg | ORAL_TABLET | Freq: Two times a day (BID) | ORAL | Status: DC
  Administered 2012-07-12 – 2012-07-13 (×2): 1000 mg via ORAL
  Filled 2012-07-12 (×4): qty 2

## 2012-07-12 MED ORDER — LABETALOL HCL 200 MG PO TABS
200.0000 mg | ORAL_TABLET | Freq: Two times a day (BID) | ORAL | Status: DC
  Administered 2012-07-12 – 2012-07-13 (×3): 200 mg via ORAL
  Filled 2012-07-12 (×4): qty 1

## 2012-07-12 MED ORDER — LISINOPRIL 40 MG PO TABS
40.0000 mg | ORAL_TABLET | Freq: Every day | ORAL | Status: DC
  Administered 2012-07-13: 40 mg via ORAL
  Filled 2012-07-12: qty 1

## 2012-07-12 NOTE — Progress Notes (Signed)
Inpatient Diabetes Program Recommendations  AACE/ADA: New Consensus Statement on Inpatient Glycemic Control (2013)  Target Ranges:  Prepandial:   less than 140 mg/dL      Peak postprandial:   less than 180 mg/dL (1-2 hours)      Critically ill patients:  140 - 180 mg/dL   Reason for Visit: Hyperglycemia  Inpatient Diabetes Program Recommendations Insulin - Basal: Please add 15-20 units Lantus daily or HS  Note: Thank you, Rosita Kea, RN, CNS, Diabetes Coordinator [phone])

## 2012-07-12 NOTE — Progress Notes (Signed)
Subjective: Patient complaining of generalized pain.  Objective: Vital signs in last 24 hours: Filed Vitals:   07/12/12 0342 07/12/12 0500 07/12/12 0800 07/12/12 0900  BP:  163/89 164/92 174/126  Pulse: 106 105 98   Temp:  98.4 F (36.9 C) 98.2 F (36.8 C)   TempSrc:  Oral Oral   Resp:  18 18   Height:      Weight:      SpO2:  96% 96%    Weight change:   Intake/Output Summary (Last 24 hours) at 07/12/12 1237 Last data filed at 07/11/12 1300  Gross per 24 hour  Intake    480 ml  Output      0 ml  Net    480 ml    Physical Exam: General: Awake, Oriented, No acute distress. HEENT: EOMI. Neck: Supple CV: S1 and S2 Lungs: Clear to ascultation bilaterally Abdomen: Soft, Nontender, Nondistended, +bowel sounds. Ext: Good pulses. Trace edema.  Lab Results: Basic Metabolic Panel:  Lab 123XX123 0700 07/09/12 1749  NA -- 137  K -- 4.0  CL -- 99  CO2 -- 24  GLUCOSE -- 536*  BUN -- 8  CREATININE 0.81 1.03  CALCIUM -- 9.7  MG -- --  PHOS -- --   Liver Function Tests:  Lab 07/09/12 1749  AST 44*  ALT 29  ALKPHOS 87  BILITOT 0.2*  PROT 7.7  ALBUMIN 3.7   No results found for this basename: LIPASE:5,AMYLASE:5 in the last 168 hours No results found for this basename: AMMONIA:5 in the last 168 hours CBC:  Lab 07/10/12 0700 07/09/12 1749  WBC 4.7 4.8  NEUTROABS -- 2.4  HGB 11.5* 11.4*  HCT 35.6* 35.0*  MCV 91.3 91.4  PLT 89* 107*   Cardiac Enzymes: No results found for this basename: CKTOTAL:5,CKMB:5,CKMBINDEX:5,TROPONINI:5 in the last 168 hours BNP (last 3 results) No results found for this basename: PROBNP:3 in the last 8760 hours CBG:  Lab 07/12/12 1203 07/12/12 1122 07/12/12 0740 07/11/12 2136 07/11/12 1642  GLUCAP 248* 234* 216* 218* 230*    Basename 07/10/12 0700  HGBA1C 8.1*   Other Labs: No components found with this basename: POCBNP:3 No results found for this basename: DDIMER:2 in the last 168 hours  Lab 07/10/12 0700  CHOL 115  HDL 45   LDLCALC 34  TRIG 181*  CHOLHDL 2.6  LDLDIRECT --   No results found for this basename: TSH,T4TOTAL,FREET3,T3FREE,FREET4,THYROIDAB in the last 168 hours No results found for this basename: VITAMINB12:2,FOLATE:2,FERRITIN:2,TIBC:2,IRON:2,RETICCTPCT:2 in the last 168 hours  Micro Results: No results found for this or any previous visit (from the past 240 hour(s)).  Studies/Results: Dg Chest 2 View  07/10/2012  *RADIOLOGY REPORT*  Clinical Data: Shortness of breath, diabetes, post stroke  CHEST - 2 VIEW  Comparison: 07/26/2009  Findings: Normal heart size, mediastinal contours, and pulmonary vascularity. Lungs clear. No pleural effusion or pneumothorax. Bones unremarkable.  IMPRESSION: No acute abnormalities.   Original Report Authenticated By: Burnetta Sabin, M.D.     Medications: I have reviewed the patient's current medications. Scheduled Meds:    . amLODipine  10 mg Oral Daily  . amLODipine  5 mg Oral Once  . aspirin  300 mg Rectal Daily   Or  . aspirin  325 mg Oral Daily  . enoxaparin  40 mg Subcutaneous Q24H  . folic acid  1 mg Oral Daily  . gabapentin  300 mg Oral TID  . glipiZIDE  10 mg Oral BID AC  . insulin aspart  0-15 Units Subcutaneous TID WC  . labetalol  200 mg Oral BID  . lisinopril  10 mg Oral Once  . lisinopril  20 mg Oral Once  . lisinopril  40 mg Oral Daily  . metFORMIN  1,000 mg Oral BID WC  . multivitamin with minerals  1 tablet Oral Daily  . thiamine  100 mg Oral Daily  . DISCONTD: amLODipine  5 mg Oral Daily  . DISCONTD: gabapentin  300 mg Oral BID  . DISCONTD: glipiZIDE  5 mg Oral BID AC  . DISCONTD: lisinopril  10 mg Oral Daily  . DISCONTD: lisinopril  20 mg Oral Daily  . DISCONTD: metFORMIN  500 mg Oral BID WC  . DISCONTD: metFORMIN  850 mg Oral BID WC   Continuous Infusions:  PRN Meds:.acetaminophen, hydrALAZINE, labetalol, LORazepam, LORazepam, LORazepam, ondansetron, oxyCODONE-acetaminophen, senna-docusate  2-D echocardiogram on  07/10/2012 Study Conclusions Left ventricle: The cavity size was normal. Wall thickness was increased in a pattern of mild LVH. Systolic function was normal. The estimated ejection fraction was in the range of 55% to 60%. Wall motion was normal; there were no regional wall motion abnormalities. Doppler parameters are consistent with abnormal left ventricular relaxation (grade 1 diastolic dysfunction).   Assessment/Plan: Small left pontine infarct.  Continue ASA 325 mg.  2-D echocardiogram done with results as indicated above.  Carotid Dopplers on 07/10/2012 showed no obvious evidence for internal carotid artery stenosis bilaterally.  LDL 34, no need for statin. No PT/OT needs.  Polysubstance abuse  For cocaine and alcohol abuse to [facility] on Monday. For alcohol abuse, continue thiamine and folic acid.  Patient on CIWA protocol to be completed on 07/13/2012, last consumption of alcohol was on 07/06/2012.  Counseled on cessation.  Uncontrolled diabetes 2 with complications Not well controlled. Hemoglobin A1c 8.1. Increase the dose of metformin. Continue glipizide.  Continue sliding scale insulin.  Uncontrolled hypertension  Still not well controlled.  Continue amlodipine to 10 mg daily, increase lisinopril to 40 mg daily.  Start Labetalol 200 mg BID. Continue to monitor with PRN hydralazine and labetalol.    Prophylaxis Lovenox.  Code Status Full code.  Disposition Pending. Detox from alcohol once patient completes CIWA protocol.  Likely will plan on discharge on 07/13/2012.   LOS: 3 days  Michie Molnar A, MD 07/12/2012, 12:37 PM

## 2012-07-13 LAB — GLUCOSE, CAPILLARY
Glucose-Capillary: 229 mg/dL — ABNORMAL HIGH (ref 70–99)
Glucose-Capillary: 241 mg/dL — ABNORMAL HIGH (ref 70–99)

## 2012-07-13 MED ORDER — ASPIRIN 325 MG PO TABS: 325.0000 mg | ORAL_TABLET | Freq: Every day | ORAL | Status: DC

## 2012-07-13 MED ORDER — OXYCODONE-ACETAMINOPHEN 5-325 MG PO TABS: 0.5000 | ORAL_TABLET | Freq: Four times a day (QID) | ORAL | Status: AC | PRN

## 2012-07-13 MED ORDER — GABAPENTIN 300 MG PO CAPS: 300.0000 mg | ORAL_CAPSULE | Freq: Two times a day (BID) | ORAL | Status: DC

## 2012-07-13 MED ORDER — SENNOSIDES-DOCUSATE SODIUM 8.6-50 MG PO TABS: 1.0000 | ORAL_TABLET | Freq: Every evening | ORAL | Status: DC | PRN

## 2012-07-13 MED ORDER — THIAMINE HCL 100 MG PO TABS: 100.0000 mg | ORAL_TABLET | Freq: Every day | ORAL | Status: DC

## 2012-07-13 MED ORDER — FOLIC ACID 1 MG PO TABS: 1.0000 mg | ORAL_TABLET | Freq: Every day | ORAL | Status: DC

## 2012-07-13 MED ORDER — GLIPIZIDE 10 MG PO TABS: 10.0000 mg | ORAL_TABLET | Freq: Two times a day (BID) | ORAL | Status: DC

## 2012-07-13 MED ORDER — METFORMIN HCL 1000 MG PO TABS: 1000.0000 mg | ORAL_TABLET | Freq: Two times a day (BID) | ORAL | Status: DC

## 2012-07-13 MED ORDER — LABETALOL HCL 200 MG PO TABS: 200.0000 mg | ORAL_TABLET | Freq: Two times a day (BID) | ORAL | Status: DC

## 2012-07-13 MED ORDER — OXYCODONE-ACETAMINOPHEN 5-325 MG PO TABS: 0.5000 | ORAL_TABLET | Freq: Four times a day (QID) | ORAL | Status: DC | PRN

## 2012-07-13 MED ORDER — LISINOPRIL 40 MG PO TABS: 40.0000 mg | ORAL_TABLET | Freq: Every day | ORAL | Status: DC

## 2012-07-13 MED ORDER — AMLODIPINE BESYLATE 10 MG PO TABS: 10.0000 mg | ORAL_TABLET | Freq: Every day | ORAL | Status: DC

## 2012-07-13 NOTE — Progress Notes (Signed)
CSW spoke with [facility] after all clinicals have been received. Medical director of facility now reviewing clinicals for pt admission. Pt admission pending medical clearance per medical director.   [name], [place] .07/13/2012 1317pm

## 2012-07-13 NOTE — Progress Notes (Signed)
Subjective: Feeling better today. Pain better controlled.  Objective: Vital signs in last 24 hours: Filed Vitals:   07/13/12 0400 07/13/12 0804 07/13/12 1000 07/13/12 1159  BP: 130/82 166/113 168/119 137/89  Pulse: 80 78 89 78  Temp: 98.2 F (36.8 C) 98 F (36.7 C)  97.4 F (36.3 C)  TempSrc:  Oral    Resp: 16 16  16   Height:      Weight:      SpO2: 96% 96%  100%   Weight change:   Intake/Output Summary (Last 24 hours) at 07/13/12 1208 Last data filed at 07/12/12 1700  Gross per 24 hour  Intake    360 ml  Output      0 ml  Net    360 ml    Physical Exam: General: Awake, Oriented, No acute distress. HEENT: EOMI. Neck: Supple CV: S1 and S2 Lungs: Clear to ascultation bilaterally Abdomen: Soft, Nontender, Nondistended, +bowel sounds. Ext: Good pulses. Trace edema.  Lab Results: Basic Metabolic Panel:  Lab 123XX123 0700 07/09/12 1749  NA -- 137  K -- 4.0  CL -- 99  CO2 -- 24  GLUCOSE -- 536*  BUN -- 8  CREATININE 0.81 1.03  CALCIUM -- 9.7  MG -- --  PHOS -- --   Liver Function Tests:  Lab 07/09/12 1749  AST 44*  ALT 29  ALKPHOS 87  BILITOT 0.2*  PROT 7.7  ALBUMIN 3.7   No results found for this basename: LIPASE:5,AMYLASE:5 in the last 168 hours No results found for this basename: AMMONIA:5 in the last 168 hours CBC:  Lab 07/10/12 0700 07/09/12 1749  WBC 4.7 4.8  NEUTROABS -- 2.4  HGB 11.5* 11.4*  HCT 35.6* 35.0*  MCV 91.3 91.4  PLT 89* 107*   Cardiac Enzymes: No results found for this basename: CKTOTAL:5,CKMB:5,CKMBINDEX:5,TROPONINI:5 in the last 168 hours BNP (last 3 results) No results found for this basename: PROBNP:3 in the last 8760 hours CBG:  Lab 07/13/12 1104 07/13/12 0729 07/12/12 2036 07/12/12 1623 07/12/12 1203  GLUCAP 241* 202* 174* 154* 248*   No results found for this basename: HGBA1C:5 in the last 72 hours Other Labs: No components found with this basename: POCBNP:3 No results found for this basename: DDIMER:2 in the  last 168 hours  Lab 07/10/12 0700  CHOL 115  HDL 45  LDLCALC 34  TRIG 181*  CHOLHDL 2.6  LDLDIRECT --   No results found for this basename: TSH,T4TOTAL,FREET3,T3FREE,FREET4,THYROIDAB in the last 168 hours No results found for this basename: VITAMINB12:2,FOLATE:2,FERRITIN:2,TIBC:2,IRON:2,RETICCTPCT:2 in the last 168 hours  Micro Results: No results found for this or any previous visit (from the past 240 hour(s)).  Studies/Results: No results found.  Medications: I have reviewed the patient's current medications. Scheduled Meds:    . amLODipine  10 mg Oral Daily  . aspirin  300 mg Rectal Daily   Or  . aspirin  325 mg Oral Daily  . enoxaparin  40 mg Subcutaneous Q24H  . folic acid  1 mg Oral Daily  . gabapentin  300 mg Oral TID  . glipiZIDE  10 mg Oral BID AC  . insulin aspart  0-15 Units Subcutaneous TID WC  . labetalol  200 mg Oral BID  . lisinopril  20 mg Oral Once  . lisinopril  40 mg Oral Daily  . metFORMIN  1,000 mg Oral BID WC  . multivitamin with minerals  1 tablet Oral Daily  . thiamine  100 mg Oral Daily   Continuous Infusions:  PRN Meds:.acetaminophen, hydrALAZINE, labetalol, LORazepam, LORazepam, LORazepam, ondansetron, oxyCODONE-acetaminophen, senna-docusate  2-D echocardiogram on 07/10/2012 Study Conclusions Left ventricle: The cavity size was normal. Wall thickness was increased in a pattern of mild LVH. Systolic function was normal. The estimated ejection fraction was in the range of 55% to 60%. Wall motion was normal; there were no regional wall motion abnormalities. Doppler parameters are consistent with abnormal left ventricular relaxation (grade 1 diastolic dysfunction).   Assessment/Plan: Small left pontine infarct.  Continue ASA 325 mg.  2-D echocardiogram done with results as indicated above.  Carotid Dopplers on 07/10/2012 showed no obvious evidence for internal carotid artery stenosis bilaterally.  LDL 34, no need for statin. No PT/OT needs.  Neurology signed off.  Polysubstance abuse  For cocaine and alcohol abuse to [facility] on 07/13/2012. For alcohol abuse, continue thiamine and folic acid.  Patient completed CIWA protocol on 07/13/2012, last consumption of alcohol was on 07/06/2012.  Counseled on cessation.  Uncontrolled diabetes 2 with complications Not well controlled. Hemoglobin A1c 8.1. Continue metformin and glipizide.  Do not think he needs insulin at this time, may need it as outpatient. Patient at this time reluctant to take insulin, but understands that he may need to take it in the future.  Continue oral regimen to encourage compliance. Continue sliding scale insulin.  Uncontrolled hypertension  Still not well controlled, but better controlled from admission.  Continue amlodipine to 10 mg daily, lisinopril to 40 mg daily, and Labetalol 200 mg BID. Clonidine discontinued to prevent rebound hypertension. Further titration as outpatient.  Prophylaxis Lovenox.  Code Status Full code.  Disposition Discharge patient today.    LOS: 4 days  Kendrea Cerritos A, MD 07/13/2012, 12:08 PM

## 2012-07-13 NOTE — Progress Notes (Signed)
VASCULAR LAB PRELIMINARY  PRELIMINARY  PRELIMINARY  PRELIMINARY   TCD completed.    Preliminary report: TCD completed  Morgen Ritacco, RVS 07/13/2012, 1:18 PM

## 2012-07-13 NOTE — Progress Notes (Signed)
Clinical social worker assisted with pt dc to [facility]. Pt transportation provide by cab voucher as pt needed to be to [place] by 330pm. Per discussion with [facility], facility is able to monitor pt cbg's with cbg meter.   Dorathy Kinsman, [place] .07/13/2012 1505pm

## 2012-07-13 NOTE — Progress Notes (Signed)
CSW submitted clinicals to [facility] as requested by facility. CSW awaiting medical clearance by facility. Pt is medically stable for dc today per discussion with pt md. Pt will need 14 d supply of medications for [facility] from pharmacy.   Dorathy Kinsman, [place] .07/13/2012 1053am

## 2012-07-13 NOTE — Discharge Summary (Signed)
Physician Discharge Summary  Ezren Barfuss [id number] DOB: [birth year] DOA: 07/09/2012  PCP: William Hamburger, MD  Admit date: 07/09/2012 Discharge date: 07/13/2012  Recommendations for Outpatient Follow-up:  1. Followup with PCP in 2-3 weeks. 2. Followup with [facility] for rehab today. 3. Check blood sugars twice daily and discuss with your primary care physician.  Discharge Diagnoses:  Active Problems:  CVA (cerebral infarction)  Diabetes mellitus  Hyperglycemia without ketosis  Polysubstance abuse  Cocaine abuse  Hypertension   Discharge Condition: Stable  Diet recommendation: Diabetic diet.  Filed Weights   07/10/12 0233  Weight: 95.255 kg (210 lb)    History of present illness:  [name] is a 51 year old male with history of alcohol and crack or pain abuse presented to the ER for medical clearance for drug rehabilitation at [facility] on 07/10/2012  Hospital Course:  Small left pontine infarct.  Continue ASA 325 mg.  2-D echocardiogram done with results as indicated below.  Carotid Dopplers on 07/10/2012 showed no obvious evidence for internal carotid artery stenosis bilaterally.  LDL 34, no need for statin. No PT/OT needs. Neurology, Dr. Leonie Man evaluated the patient and made appropriate recommendations.  Polysubstance abuse  For cocaine and alcohol abuse to [facility] on 07/13/2012. For alcohol abuse, continue thiamine and folic acid.  Patient completed CIWA protocol on 07/13/2012, last consumption of alcohol was on 07/06/2012.  Counseled on cessation.  Uncontrolled diabetes 2 with complications Not well controlled, initally. Hemoglobin A1c 8.1. Started metformin and glipizide and tirated the dose.  Do not think he needs insulin at this time, may need it as outpatient. Patient at this time reluctant to take insulin, but understands that he may need to take it in the future.  Continue oral regimen to encourage compliance. Continue sliding scale insulin.  Uncontrolled  hypertension  Still not well controlled, but better controlled from admission.  Continue amlodipine to 10 mg daily, lisinopril to 40 mg daily, and Labetalol 200 mg BID. Clonidine discontinued to prevent rebound hypertension. Further titration as outpatient.  Procedures:  As above.  Consultations:  Dr. Leonie Man, neurology.  Discharge Exam: Filed Vitals:   07/13/12 1159  BP: 137/89  Pulse: 78  Temp: 97.4 F (36.3 C)  Resp: 16   Filed Vitals:   07/13/12 0400 07/13/12 0804 07/13/12 1000 07/13/12 1159  BP: 130/82 166/113 168/119 137/89  Pulse: 80 78 89 78  Temp: 98.2 F (36.8 C) 98 F (36.7 C)  97.4 F (36.3 C)  TempSrc:  Oral    Resp: 16 16  16   Height:      Weight:      SpO2: 96% 96%  100%   Discharge Instructions  Discharge Orders    Future Orders Please Complete By Expires   Diet Carb Modified      Increase activity slowly      Discharge instructions      Comments:   Followup with PCP with resources provided to you by case manager in 2-3 weeks. Please check your blood sugars twice daily and discuss with your PCP.     Medication List  As of 07/13/2012 12:14 PM   STOP taking these medications         cloNIDine 0.1 MG tablet      diclofenac 50 MG EC tablet      HYDROcodone-acetaminophen 5-500 MG per tablet         TAKE these medications         amLODipine 10 MG tablet   Commonly known as: NORVASC  Take 1 tablet (10 mg total) by mouth daily.      aspirin 325 MG tablet   Take 1 tablet (325 mg total) by mouth daily.      folic acid 1 MG tablet   Commonly known as: FOLVITE   Take 1 tablet (1 mg total) by mouth daily.      gabapentin 300 MG capsule   Commonly known as: NEURONTIN   Take 1 capsule (300 mg total) by mouth 2 (two) times daily.      glipiZIDE 10 MG tablet   Commonly known as: GLUCOTROL   Take 1 tablet (10 mg total) by mouth 2 (two) times daily before a meal.      labetalol 200 MG tablet   Commonly known as: NORMODYNE   Take 1 tablet (200  mg total) by mouth 2 (two) times daily.      lisinopril 40 MG tablet   Commonly known as: PRINIVIL,ZESTRIL   Take 1 tablet (40 mg total) by mouth daily.      metFORMIN 1000 MG tablet   Commonly known as: GLUCOPHAGE   Take 1 tablet (1,000 mg total) by mouth 2 (two) times daily with a meal.      oxyCODONE-acetaminophen 5-325 MG per tablet   Commonly known as: PERCOCET/ROXICET   Take 0.5-1 tablets by mouth every 6 (six) hours as needed for pain.      senna-docusate 8.6-50 MG per tablet   Commonly known as: Senokot-S   Take 1 tablet by mouth at bedtime as needed.      thiamine 100 MG tablet   Take 1 tablet (100 mg total) by mouth daily.              The results of significant diagnostics from this hospitalization (including imaging, microbiology, ancillary and laboratory) are listed below for reference.    Significant Diagnostic Studies: Dg Chest 2 View  07/10/2012  *RADIOLOGY REPORT*  Clinical Data: Shortness of breath, diabetes, post stroke  CHEST - 2 VIEW  Comparison: 07/26/2009  Findings: Normal heart size, mediastinal contours, and pulmonary vascularity. Lungs clear. No pleural effusion or pneumothorax. Bones unremarkable.  IMPRESSION: No acute abnormalities.   Original Report Authenticated By: Burnetta Sabin, M.D.    Ct Head Wo Contrast  07/09/2012  *RADIOLOGY REPORT*  Clinical Data: Frontal headache.  Dizziness.  History of alcohol and drug abuse.  CT HEAD WITHOUT CONTRAST  Technique:  Contiguous axial images were obtained from the base of the skull through the vertex without contrast.  Comparison: None.  Findings: Mildly enlarged ventricles and subarachnoid spaces.  No intracranial hemorrhage, mass lesion or CT evidence of acute infarction.  Small amount of left cerebellar calcification. Aplastic frontal sinuses.  IMPRESSION:  1.  No acute abnormality. 2.  Mild atrophy. 3.  Small amount of left cerebellar calcification. This could be due to a previous infection.  Calcification  associate with a vascular malformation is also a possibility.  There is no surrounding edema or mass effect to indicate calcification associated with a neoplasm.  A brain MR without and with contrast would be helpful for excluding a neoplasm without visible edema.   Original Report Authenticated By: Gerald Stabs, M.D.    Mr Jeri Cos Wo Contrast  07/09/2012  *RADIOLOGY REPORT*  Clinical Data: Medical clearance.  Headache.  Polysubstance abuse. Diabetes  MRI HEAD WITHOUT AND WITH CONTRAST  Technique:  Multiplanar, multiecho pulse sequences of the brain and surrounding structures were obtained according to standard protocol without and  with intravenous contrast  Contrast: 90mL MULTIHANCE GADOBENATE DIMEGLUMINE 529 MG/ML IV SOLN  Comparison: CT 07/09/2012  Findings: Small focus of restricted diffusion in the left anterior pons, most likely an area of acute infarct.  Chronic appearing infarct in the left pons centrally.  Mild chronic changes in the white matter.  Lobular calcification in the left medial cerebellum on the CT is best seen on gradient echo imaging.  There is mild surrounding hyperintensity on FLAIR.  This area does not show abnormal enhancement.  This appears to be a chronic abnormality such as prior hemorrhage or infection which has calcified.  In addition, numerous other areas of mineralization are present in the brain with a similar appearance. There is a 5 mm area of magnetic susceptibility in the right cerebellum, with other areas of susceptibility in the right basal ganglia, left temporal lobe, and right and left parietal lobes.  These are most likely areas of chronic hemorrhage.  Ventricle size is normal.  No midline shift.  Postcontrast imaging reveals no enhancing lesions.  IMPRESSION: Small area restricted diffusion left anterior pons compatible with acute infarct.  There is also a chronic infarct in the left mid pons.  Numerous areas of chronic susceptibility artifact in the brain, most  compatible with prior hemorrhage.  The left cerebellar area is calcified on CT.  These may be due to chronic hypertension, prior trauma, endocarditis, or small vascular malformations.  No enhancing lesions are seen post contrast infusion.   Original Report Authenticated By: Truett Perna, M.D.    2-D echocardiogram on 07/10/2012 Study Conclusions Left ventricle: The cavity size was normal. Wall thickness was increased in a pattern of mild LVH. Systolic function was normal. The estimated ejection fraction was in the range of 55% to 60%. Wall motion was normal; there were no regional wall motion abnormalities. Doppler parameters are consistent with abnormal left ventricular relaxation (grade 1 diastolic dysfunction).   Microbiology: No results found for this or any previous visit (from the past 240 hour(s)).   Labs: Basic Metabolic Panel:  Lab [id number] 0700 07/09/12 1749  NA -- 137  K -- 4.0  CL -- 99  CO2 -- 24  GLUCOSE -- 536*  BUN -- 8  CREATININE 0.81 1.03  CALCIUM -- 9.7  MG -- --  PHOS -- --   Liver Function Tests:  Lab 07/09/12 1749  AST 44*  ALT 29  ALKPHOS 87  BILITOT 0.2*  PROT 7.7  ALBUMIN 3.7   No results found for this basename: LIPASE:5,AMYLASE:5 in the last 168 hours No results found for this basename: AMMONIA:5 in the last 168 hours CBC:  Lab 07/10/12 0700 07/09/12 1749  WBC 4.7 4.8  NEUTROABS -- 2.4  HGB 11.5* 11.4*  HCT 35.6* 35.0*  MCV 91.3 91.4  PLT 89* 107*   Cardiac Enzymes: No results found for this basename: CKTOTAL:5,CKMB:5,CKMBINDEX:5,TROPONINI:5 in the last 168 hours BNP: BNP (last 3 results) No results found for this basename: PROBNP:3 in the last 8760 hours CBG:  Lab 07/13/12 1104 07/13/12 0729 07/12/12 2036 07/12/12 1623 07/12/12 1203  GLUCAP 241* 202* 174* 154* 248*    Time coordinating discharge: 40 minutes  Signed:  Kilo Eshelman A  [facility] 07/13/2012, 12:14 PM

## 2012-07-13 NOTE — Progress Notes (Signed)
[name] most recent blood pressure as of 8/26 at 1200 was 137/89.

## 2013-02-08 ENCOUNTER — Emergency Department (HOSPITAL_COMMUNITY)
Admission: EM | Admit: 2013-02-08 | Discharge: 2013-02-08 | Disposition: A | Discharge status: Home / Self Care | Payer: Medicaid Other | Attending: Emergency Medicine | Admitting: Emergency Medicine

## 2013-02-08 ENCOUNTER — Encounter (HOSPITAL_COMMUNITY): Payer: Self-pay | Admitting: Emergency Medicine

## 2013-02-08 DIAGNOSIS — I10 Essential (primary) hypertension: Secondary | ICD-10-CM | POA: Insufficient documentation

## 2013-02-08 DIAGNOSIS — M1710 Unilateral primary osteoarthritis, unspecified knee: Secondary | ICD-10-CM | POA: Insufficient documentation

## 2013-02-08 DIAGNOSIS — M25561 Pain in right knee: Secondary | ICD-10-CM

## 2013-02-08 DIAGNOSIS — F17200 Nicotine dependence, unspecified, uncomplicated: Secondary | ICD-10-CM | POA: Insufficient documentation

## 2013-02-08 DIAGNOSIS — F1410 Cocaine abuse, uncomplicated: Secondary | ICD-10-CM | POA: Insufficient documentation

## 2013-02-08 DIAGNOSIS — Z79899 Other long term (current) drug therapy: Secondary | ICD-10-CM | POA: Insufficient documentation

## 2013-02-08 DIAGNOSIS — G8929 Other chronic pain: Secondary | ICD-10-CM

## 2013-02-08 DIAGNOSIS — F1910 Other psychoactive substance abuse, uncomplicated: Secondary | ICD-10-CM

## 2013-02-08 DIAGNOSIS — E119 Type 2 diabetes mellitus without complications: Secondary | ICD-10-CM | POA: Insufficient documentation

## 2013-02-08 DIAGNOSIS — M549 Dorsalgia, unspecified: Secondary | ICD-10-CM | POA: Insufficient documentation

## 2013-02-08 DIAGNOSIS — Z7982 Long term (current) use of aspirin: Secondary | ICD-10-CM | POA: Insufficient documentation

## 2013-02-08 DIAGNOSIS — F1110 Opioid abuse, uncomplicated: Secondary | ICD-10-CM | POA: Insufficient documentation

## 2013-02-08 HISTORY — DX: Unspecified osteoarthritis, unspecified site: M19.90

## 2013-02-08 MED ORDER — HYDROCODONE-ACETAMINOPHEN 5-325 MG PO TABS
2.0000 | ORAL_TABLET | Freq: Once | ORAL | Status: AC
  Administered 2013-02-08: 2 via ORAL
  Filled 2013-02-08: qty 2
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
## 2013-02-08 NOTE — ED Notes (Signed)
PT. REPORTS ARTHRITIS ON BOTH KNEES FOR SEVERAL DAYS , DENIES INJURY OR FALL , AMBULATORY.

## 2013-02-18 ENCOUNTER — Emergency Department (INDEPENDENT_AMBULATORY_CARE_PROVIDER_SITE_OTHER)
Admission: EM | Admit: 2013-02-18 | Discharge: 2013-02-18 | Disposition: A | Discharge status: Home / Self Care | Payer: Medicaid Other | Source: Home / Self Care | Attending: Emergency Medicine | Admitting: Emergency Medicine

## 2013-02-18 ENCOUNTER — Encounter (HOSPITAL_COMMUNITY): Payer: Self-pay

## 2013-02-18 DIAGNOSIS — M545 Low back pain, unspecified: Secondary | ICD-10-CM

## 2013-02-18 DIAGNOSIS — G8929 Other chronic pain: Secondary | ICD-10-CM

## 2013-02-18 DIAGNOSIS — R52 Pain, unspecified: Secondary | ICD-10-CM

## 2013-02-18 DIAGNOSIS — M549 Dorsalgia, unspecified: Secondary | ICD-10-CM

## 2013-02-18 MED ORDER — KETOROLAC TROMETHAMINE 30 MG/ML IJ SOLN
INTRAMUSCULAR | Status: AC
  Filled 2013-02-18: qty 1

## 2013-02-18 MED ORDER — KETOROLAC TROMETHAMINE 30 MG/ML IJ SOLN
30.0000 mg | Freq: Once | INTRAMUSCULAR | Status: AC
  Administered 2013-02-18: 30 mg via INTRAMUSCULAR

## 2013-02-18 MED ORDER — MELOXICAM 7.5 MG PO TABS: 7.5000 mg | ORAL_TABLET | Freq: Every day | ORAL | Status: DC

## 2013-02-18 NOTE — ED Notes (Signed)
No answer x2 

## 2013-02-18 NOTE — ED Notes (Signed)
No answer in lobby x1

## 2013-02-18 NOTE — ED Provider Notes (Signed)
History     CSN: [id number]  Arrival date & time 02/18/13  1344   First MD Initiated Contact with Patient 02/18/13 1422      Chief Complaint  Patient presents with  . Joint Pain    (Consider location/radiation/quality/duration/timing/severity/associated sxs/prior treatment) HPI Comments: Patient presents urgent care today describing ongoing back pain and knee pain. Patient describes that he has not had any recent falls or injuries, both his knees hurt her lower back. Describe some and recurrent and intermittent tingling sensation to his lower extremities. Denies any weakness of his lower extremities. Denies any constitutional symptoms such as fevers, nausea vomiting or on additional weight loss. Patient does not recall having been seen by Dr. Alyson Ingles, and haven't received it prescription of Lortab for 50 tablets.   Past Medical History  Diagnosis Date  . Drug abuse   . Alcohol abuse   . Diabetes mellitus   . Hypertension   . Arthritis     History reviewed. No pertinent past surgical history.  History reviewed. No pertinent family history.  History  Substance Use Topics  . Smoking status: Current Every Day Smoker -- 0.50 packs/day  . Smokeless tobacco: Not on file  . Alcohol Use: Yes      Review of Systems  Constitutional: Negative for fever, activity change, appetite change, fatigue and unexpected weight change.  HENT: Negative for neck pain.   Respiratory: Negative for shortness of breath.   Gastrointestinal: Negative for nausea, vomiting, abdominal pain, diarrhea, constipation and rectal pain.  Musculoskeletal: Positive for back pain. Negative for joint swelling, arthralgias and gait problem.  Skin: Negative for color change, pallor and rash.    Allergies  Review of patient's allergies indicates no known allergies.  Home Medications   Current Outpatient Rx  Name  Route  Sig  Dispense  Refill  . cloNIDine (CATAPRES) 0.1 MG tablet   Oral   Take 0.1 mg by  mouth 2 (two) times daily.         Marland Kitchen amLODipine (NORVASC) 10 MG tablet   Oral   Take 1 tablet (10 mg total) by mouth daily.   14 tablet   0   . aspirin EC 81 MG tablet   Oral   Take 81 mg by mouth daily.         Marland Kitchen atenolol (TENORMIN) 50 MG tablet   Oral   Take 50 mg by mouth daily.         . meloxicam (MOBIC) 7.5 MG tablet   Oral   Take 1 tablet (7.5 mg total) by mouth daily. Take one tablet daily for 2 weeks   14 tablet   0   . metFORMIN (GLUCOPHAGE) 500 MG tablet   Oral   Take 500 mg by mouth daily.           BP 138/93  Pulse 80  Temp(Src) 97.9 F (36.6 C) (Oral)  Resp 20  SpO2 97%  Physical Exam  Vitals reviewed. Constitutional: He appears well-developed and well-nourished.  Abdominal: Soft.  Musculoskeletal: He exhibits tenderness.       Lumbar back: He exhibits tenderness and pain. He exhibits normal range of motion, no bony tenderness, no deformity, no laceration, no spasm and normal pulse.       Back:  Skin: No rash noted. No erythema.    ED Course  Procedures (including critical care time)  Labs Reviewed - No data to display No results found.   1. Chronic low back pain  2. Exacerbation of chronic back pain    Toradol IM X 30 MG   MDM  Exacerbation of chronic back pain. Patient has established care with a local practitioner have encouraged patient to report any medicines recently prescribed by any physicians including narcotics. Prescription for provider for meloxicam for 2 weeks.        Rosana Hoes, MD 02/18/13 [phone]
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
## 2013-03-09 ENCOUNTER — Emergency Department (HOSPITAL_COMMUNITY): Payer: Medicaid Other

## 2013-03-09 ENCOUNTER — Encounter (HOSPITAL_COMMUNITY): Payer: Self-pay | Admitting: *Deleted

## 2013-03-09 ENCOUNTER — Emergency Department (HOSPITAL_COMMUNITY)
Admission: EM | Admit: 2013-03-09 | Discharge: 2013-03-09 | Disposition: A | Discharge status: Home / Self Care | Payer: Medicaid Other | Attending: Emergency Medicine | Admitting: Emergency Medicine

## 2013-03-09 DIAGNOSIS — M109 Gout, unspecified: Secondary | ICD-10-CM | POA: Insufficient documentation

## 2013-03-09 DIAGNOSIS — M129 Arthropathy, unspecified: Secondary | ICD-10-CM | POA: Insufficient documentation

## 2013-03-09 DIAGNOSIS — M79672 Pain in left foot: Secondary | ICD-10-CM

## 2013-03-09 DIAGNOSIS — Y9389 Activity, other specified: Secondary | ICD-10-CM | POA: Insufficient documentation

## 2013-03-09 DIAGNOSIS — F1910 Other psychoactive substance abuse, uncomplicated: Secondary | ICD-10-CM | POA: Insufficient documentation

## 2013-03-09 DIAGNOSIS — F1010 Alcohol abuse, uncomplicated: Secondary | ICD-10-CM | POA: Insufficient documentation

## 2013-03-09 DIAGNOSIS — Z7982 Long term (current) use of aspirin: Secondary | ICD-10-CM | POA: Insufficient documentation

## 2013-03-09 DIAGNOSIS — X500XXA Overexertion from strenuous movement or load, initial encounter: Secondary | ICD-10-CM | POA: Insufficient documentation

## 2013-03-09 DIAGNOSIS — I10 Essential (primary) hypertension: Secondary | ICD-10-CM | POA: Insufficient documentation

## 2013-03-09 DIAGNOSIS — Y929 Unspecified place or not applicable: Secondary | ICD-10-CM | POA: Insufficient documentation

## 2013-03-09 DIAGNOSIS — E119 Type 2 diabetes mellitus without complications: Secondary | ICD-10-CM | POA: Insufficient documentation

## 2013-03-09 DIAGNOSIS — F17200 Nicotine dependence, unspecified, uncomplicated: Secondary | ICD-10-CM | POA: Insufficient documentation

## 2013-03-09 DIAGNOSIS — M25473 Effusion, unspecified ankle: Secondary | ICD-10-CM | POA: Insufficient documentation

## 2013-03-09 DIAGNOSIS — Z79899 Other long term (current) drug therapy: Secondary | ICD-10-CM | POA: Insufficient documentation

## 2013-03-09 DIAGNOSIS — M25476 Effusion, unspecified foot: Secondary | ICD-10-CM | POA: Insufficient documentation

## 2013-03-09 MED ORDER — HYDROCODONE-ACETAMINOPHEN 5-325 MG PO TABS: 1.0000 | ORAL_TABLET | ORAL | Status: DC | PRN

## 2013-03-09 MED ORDER — PREDNISONE 20 MG PO TABS
60.0000 mg | ORAL_TABLET | Freq: Once | ORAL | Status: AC
  Administered 2013-03-09: 60 mg via ORAL
  Filled 2013-03-09: qty 3

## 2013-03-09 MED ORDER — INDOMETHACIN 25 MG PO CAPS
25.0000 mg | ORAL_CAPSULE | Freq: Three times a day (TID) | ORAL | Status: DC | PRN
Start: 2013-03-09 — End: 2013-08-12

## 2013-03-09 MED ORDER — TRAMADOL HCL 50 MG PO TABS
50.0000 mg | ORAL_TABLET | Freq: Once | ORAL | Status: AC
  Administered 2013-03-09: 50 mg via ORAL
  Filled 2013-03-09: qty 1
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
## 2013-03-09 NOTE — ED Provider Notes (Signed)
Medical screening examination/treatment/procedure(s) were performed by non-physician practitioner and as supervising physician I was immediately available for consultation/collaboration.   Hoy Morn, MD 03/09/13 [phone]
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
## 2013-03-12 ENCOUNTER — Telehealth (HOSPITAL_COMMUNITY): Payer: Self-pay | Admitting: Emergency Medicine

## 2013-03-12 NOTE — ED Notes (Signed)
Pharmacist called because pt only wanted to fill the hydrocodone, informed that all prescriptions should be filled.

## 2013-08-07 ENCOUNTER — Emergency Department (HOSPITAL_COMMUNITY): Payer: Medicaid Other

## 2013-08-07 ENCOUNTER — Emergency Department (HOSPITAL_COMMUNITY)
Admission: EM | Admit: 2013-08-07 | Discharge: 2013-08-07 | Disposition: A | Discharge status: Other Acute Inpatient Hospital | Payer: Medicaid Other | Attending: Emergency Medicine | Admitting: Emergency Medicine

## 2013-08-07 ENCOUNTER — Inpatient Hospital Stay (HOSPITAL_COMMUNITY)
Admission: AD | Admit: 2013-08-07 | Discharge: 2013-08-09 | DRG: 897 | Disposition: A | Discharge status: Other Acute Inpatient Hospital | Payer: Medicaid Other | Source: Intra-hospital | Attending: Family Medicine | Admitting: Family Medicine

## 2013-08-07 ENCOUNTER — Encounter (HOSPITAL_COMMUNITY): Payer: Self-pay

## 2013-08-07 ENCOUNTER — Encounter (HOSPITAL_COMMUNITY): Payer: Self-pay | Admitting: Emergency Medicine

## 2013-08-07 DIAGNOSIS — F1410 Cocaine abuse, uncomplicated: Secondary | ICD-10-CM | POA: Diagnosis present

## 2013-08-07 DIAGNOSIS — F10939 Alcohol use, unspecified with withdrawal, unspecified: Principal | ICD-10-CM | POA: Diagnosis present

## 2013-08-07 DIAGNOSIS — Z8639 Personal history of other endocrine, nutritional and metabolic disease: Secondary | ICD-10-CM | POA: Insufficient documentation

## 2013-08-07 DIAGNOSIS — R45850 Homicidal ideations: Secondary | ICD-10-CM

## 2013-08-07 DIAGNOSIS — F1010 Alcohol abuse, uncomplicated: Secondary | ICD-10-CM

## 2013-08-07 DIAGNOSIS — R0789 Other chest pain: Secondary | ICD-10-CM

## 2013-08-07 DIAGNOSIS — E1159 Type 2 diabetes mellitus with other circulatory complications: Secondary | ICD-10-CM | POA: Diagnosis present

## 2013-08-07 DIAGNOSIS — E119 Type 2 diabetes mellitus without complications: Secondary | ICD-10-CM | POA: Diagnosis present

## 2013-08-07 DIAGNOSIS — R739 Hyperglycemia, unspecified: Secondary | ICD-10-CM

## 2013-08-07 DIAGNOSIS — I10 Essential (primary) hypertension: Secondary | ICD-10-CM | POA: Diagnosis present

## 2013-08-07 DIAGNOSIS — R45851 Suicidal ideations: Secondary | ICD-10-CM

## 2013-08-07 DIAGNOSIS — Z862 Personal history of diseases of the blood and blood-forming organs and certain disorders involving the immune mechanism: Secondary | ICD-10-CM | POA: Insufficient documentation

## 2013-08-07 DIAGNOSIS — Z79899 Other long term (current) drug therapy: Secondary | ICD-10-CM | POA: Insufficient documentation

## 2013-08-07 DIAGNOSIS — F17200 Nicotine dependence, unspecified, uncomplicated: Secondary | ICD-10-CM | POA: Insufficient documentation

## 2013-08-07 DIAGNOSIS — M109 Gout, unspecified: Secondary | ICD-10-CM | POA: Diagnosis present

## 2013-08-07 DIAGNOSIS — F329 Major depressive disorder, single episode, unspecified: Secondary | ICD-10-CM | POA: Diagnosis present

## 2013-08-07 DIAGNOSIS — F1910 Other psychoactive substance abuse, uncomplicated: Secondary | ICD-10-CM

## 2013-08-07 DIAGNOSIS — Z8739 Personal history of other diseases of the musculoskeletal system and connective tissue: Secondary | ICD-10-CM | POA: Insufficient documentation

## 2013-08-07 DIAGNOSIS — Z7982 Long term (current) use of aspirin: Secondary | ICD-10-CM | POA: Insufficient documentation

## 2013-08-07 DIAGNOSIS — G8929 Other chronic pain: Secondary | ICD-10-CM

## 2013-08-07 DIAGNOSIS — F1020 Alcohol dependence, uncomplicated: Secondary | ICD-10-CM | POA: Diagnosis present

## 2013-08-07 DIAGNOSIS — F10239 Alcohol dependence with withdrawal, unspecified: Principal | ICD-10-CM | POA: Diagnosis present

## 2013-08-07 DIAGNOSIS — F1994 Other psychoactive substance use, unspecified with psychoactive substance-induced mood disorder: Secondary | ICD-10-CM

## 2013-08-07 HISTORY — DX: Essential (primary) hypertension: I10

## 2013-08-07 HISTORY — DX: Gout, unspecified: M10.9

## 2013-08-07 HISTORY — DX: Alcohol use, unspecified with withdrawal delirium: F10.931

## 2013-08-07 HISTORY — DX: Cocaine use, unspecified, uncomplicated: F14.90

## 2013-08-07 HISTORY — DX: Alcohol dependence with withdrawal delirium: F10.231

## 2013-08-07 HISTORY — DX: Patient's other noncompliance with medication regimen for other reason: Z91.148

## 2013-08-07 HISTORY — DX: Patient's other noncompliance with medication regimen: Z91.14

## 2013-08-07 LAB — HEPATIC FUNCTION PANEL
ALT: 45 U/L (ref 0–53)
AST: 90 U/L — ABNORMAL HIGH (ref 0–37)
Albumin: 3.9 g/dL (ref 3.5–5.2)
Alkaline Phosphatase: 85 U/L (ref 39–117)
Bilirubin, Direct: 0.1 mg/dL (ref 0.0–0.3)
Indirect Bilirubin: 0.2 mg/dL — ABNORMAL LOW (ref 0.3–0.9)
Total Bilirubin: 0.3 mg/dL (ref 0.3–1.2)

## 2013-08-07 LAB — BASIC METABOLIC PANEL
BUN: 19 mg/dL (ref 6–23)
CO2: 24 mEq/L (ref 19–32)
Calcium: 9.2 mg/dL (ref 8.4–10.5)
Chloride: 98 mEq/L (ref 96–112)
Creatinine, Ser: 0.7 mg/dL (ref 0.50–1.35)
Glucose, Bld: 137 mg/dL — ABNORMAL HIGH (ref 70–99)
Potassium: 3.4 mEq/L — ABNORMAL LOW (ref 3.5–5.1)

## 2013-08-07 LAB — GLUCOSE, CAPILLARY: Glucose-Capillary: 182 mg/dL — ABNORMAL HIGH (ref 70–99)

## 2013-08-07 LAB — RAPID URINE DRUG SCREEN, HOSP PERFORMED
Amphetamines: NOT DETECTED
Barbiturates: NOT DETECTED
Benzodiazepines: NOT DETECTED
Cocaine: POSITIVE — AB
Opiates: NOT DETECTED
Tetrahydrocannabinol: NOT DETECTED

## 2013-08-07 LAB — CBC
HCT: 41.9 % (ref 39.0–52.0)
Hemoglobin: 14.2 g/dL (ref 13.0–17.0)
MCH: 30 pg (ref 26.0–34.0)
MCV: 88.4 fL (ref 78.0–100.0)
RBC: 4.74 MIL/uL (ref 4.22–5.81)
WBC: 6 10*3/uL (ref 4.0–10.5)

## 2013-08-07 MED ORDER — CLONIDINE HCL 0.1 MG PO TABS
0.1000 mg | ORAL_TABLET | Freq: Two times a day (BID) | ORAL | Status: DC
  Administered 2013-08-07: 0.1 mg via ORAL
  Filled 2013-08-07: qty 1

## 2013-08-07 MED ORDER — GI COCKTAIL ~~LOC~~
30.0000 mL | Freq: Once | ORAL | Status: AC
  Administered 2013-08-07: 30 mL via ORAL
  Filled 2013-08-07: qty 30

## 2013-08-07 MED ORDER — LORAZEPAM 1 MG PO TABS
0.0000 mg | ORAL_TABLET | Freq: Four times a day (QID) | ORAL | Status: DC
  Administered 2013-08-07: 2 mg via ORAL
  Administered 2013-08-08 (×2): 1 mg via ORAL
  Filled 2013-08-07 (×4): qty 1

## 2013-08-07 MED ORDER — ASPIRIN EC 81 MG PO TBEC
81.0000 mg | DELAYED_RELEASE_TABLET | Freq: Every day | ORAL | Status: DC
  Administered 2013-08-07: 81 mg via ORAL
  Filled 2013-08-07: qty 1

## 2013-08-07 MED ORDER — METFORMIN HCL 500 MG PO TABS
500.0000 mg | ORAL_TABLET | Freq: Every day | ORAL | Status: DC
  Administered 2013-08-07: 500 mg via ORAL
  Filled 2013-08-07 (×2): qty 1

## 2013-08-07 MED ORDER — MAGNESIUM HYDROXIDE 400 MG/5ML PO SUSP: 30.0000 mL | Freq: Every day | ORAL | Status: DC | PRN

## 2013-08-07 MED ORDER — ZOLPIDEM TARTRATE 5 MG PO TABS: 5.0000 mg | ORAL_TABLET | Freq: Every evening | ORAL | Status: DC | PRN

## 2013-08-07 MED ORDER — IBUPROFEN 200 MG PO TABS: 600.0000 mg | ORAL_TABLET | Freq: Three times a day (TID) | ORAL | Status: DC | PRN

## 2013-08-07 MED ORDER — ALUM & MAG HYDROXIDE-SIMETH 200-200-20 MG/5ML PO SUSP: 30.0000 mL | ORAL | Status: DC | PRN

## 2013-08-07 MED ORDER — METFORMIN HCL 500 MG PO TABS
500.0000 mg | ORAL_TABLET | Freq: Every day | ORAL | Status: DC
  Administered 2013-08-08: 500 mg via ORAL
  Filled 2013-08-07 (×3): qty 1

## 2013-08-07 MED ORDER — LORAZEPAM 1 MG PO TABS: 0.0000 mg | ORAL_TABLET | Freq: Four times a day (QID) | ORAL | Status: DC

## 2013-08-07 MED ORDER — ACETAMINOPHEN 325 MG PO TABS
650.0000 mg | ORAL_TABLET | ORAL | Status: DC | PRN
  Administered 2013-08-07: 650 mg via ORAL
  Filled 2013-08-07: qty 2

## 2013-08-07 MED ORDER — LORAZEPAM 1 MG PO TABS: 0.0000 mg | ORAL_TABLET | Freq: Two times a day (BID) | ORAL | Status: DC

## 2013-08-07 MED ORDER — THIAMINE HCL 100 MG/ML IJ SOLN: 100.0000 mg | Freq: Every day | INTRAMUSCULAR | Status: DC

## 2013-08-07 MED ORDER — AMLODIPINE BESYLATE 10 MG PO TABS
10.0000 mg | ORAL_TABLET | Freq: Every day | ORAL | Status: DC
  Administered 2013-08-08 – 2013-08-09 (×2): 10 mg via ORAL
  Filled 2013-08-07 (×5): qty 1

## 2013-08-07 MED ORDER — FAMOTIDINE 20 MG PO TABS
20.0000 mg | ORAL_TABLET | Freq: Two times a day (BID) | ORAL | Status: DC
  Administered 2013-08-07 – 2013-08-09 (×4): 20 mg via ORAL
  Filled 2013-08-07 (×8): qty 1

## 2013-08-07 MED ORDER — CLONIDINE HCL 0.1 MG PO TABS
0.1000 mg | ORAL_TABLET | Freq: Two times a day (BID) | ORAL | Status: DC
  Administered 2013-08-07 – 2013-08-08 (×3): 0.1 mg via ORAL
  Filled 2013-08-07 (×6): qty 1

## 2013-08-07 MED ORDER — ASPIRIN EC 81 MG PO TBEC
81.0000 mg | DELAYED_RELEASE_TABLET | Freq: Every day | ORAL | Status: DC
Start: 2013-08-08 — End: 2013-08-09
  Administered 2013-08-08 – 2013-08-09 (×2): 81 mg via ORAL
  Filled 2013-08-07 (×5): qty 1

## 2013-08-07 MED ORDER — VITAMIN B-1 100 MG PO TABS
100.0000 mg | ORAL_TABLET | Freq: Every day | ORAL | Status: DC
  Administered 2013-08-07: 100 mg via ORAL
  Filled 2013-08-07: qty 1

## 2013-08-07 MED ORDER — TRAZODONE HCL 50 MG PO TABS: 50.0000 mg | ORAL_TABLET | Freq: Every evening | ORAL | Status: DC | PRN

## 2013-08-07 MED ORDER — FAMOTIDINE 20 MG PO TABS
20.0000 mg | ORAL_TABLET | Freq: Two times a day (BID) | ORAL | Status: DC
  Administered 2013-08-07: 20 mg via ORAL
  Filled 2013-08-07: qty 1

## 2013-08-07 MED ORDER — RISPERIDONE 0.5 MG PO TABS
2.0000 mg | ORAL_TABLET | Freq: Every day | ORAL | Status: DC
  Administered 2013-08-08 – 2013-08-09 (×2): 2 mg via ORAL
  Filled 2013-08-07 (×3): qty 1
  Filled 2013-08-07: qty 4

## 2013-08-07 MED ORDER — VITAMIN B-1 100 MG PO TABS
100.0000 mg | ORAL_TABLET | Freq: Every day | ORAL | Status: DC
  Administered 2013-08-08: 100 mg via ORAL
  Filled 2013-08-07 (×3): qty 1

## 2013-08-07 MED ORDER — ACETAMINOPHEN 325 MG PO TABS
650.0000 mg | ORAL_TABLET | Freq: Four times a day (QID) | ORAL | Status: DC | PRN
  Administered 2013-08-07: 650 mg via ORAL

## 2013-08-07 MED ORDER — ATENOLOL 50 MG PO TABS
50.0000 mg | ORAL_TABLET | Freq: Every day | ORAL | Status: DC
  Administered 2013-08-07: 50 mg via ORAL
  Filled 2013-08-07: qty 1

## 2013-08-07 MED ORDER — NICOTINE 21 MG/24HR TD PT24
21.0000 mg | MEDICATED_PATCH | Freq: Every day | TRANSDERMAL | Status: DC
  Filled 2013-08-07 (×2): qty 1

## 2013-08-07 MED ORDER — ONDANSETRON HCL 4 MG PO TABS: 4.0000 mg | ORAL_TABLET | Freq: Three times a day (TID) | ORAL | Status: DC | PRN

## 2013-08-07 MED ORDER — RISPERIDONE 2 MG PO TABS
2.0000 mg | ORAL_TABLET | Freq: Every day | ORAL | Status: DC
  Administered 2013-08-07: 2 mg via ORAL
  Filled 2013-08-07: qty 1

## 2013-08-07 MED ORDER — AMLODIPINE BESYLATE 10 MG PO TABS
10.0000 mg | ORAL_TABLET | Freq: Every day | ORAL | Status: DC
  Administered 2013-08-07: 10 mg via ORAL
  Filled 2013-08-07: qty 1

## 2013-08-07 MED ORDER — NICOTINE 21 MG/24HR TD PT24
21.0000 mg | MEDICATED_PATCH | Freq: Every day | TRANSDERMAL | Status: DC
  Filled 2013-08-07: qty 1

## 2013-08-07 MED ORDER — GABAPENTIN 300 MG PO CAPS
300.0000 mg | ORAL_CAPSULE | Freq: Two times a day (BID) | ORAL | Status: DC
  Administered 2013-08-07: 300 mg via ORAL
  Filled 2013-08-07 (×8): qty 1

## 2013-08-07 MED ORDER — ASPIRIN 325 MG PO TABS
325.0000 mg | ORAL_TABLET | ORAL | Status: AC
  Administered 2013-08-07: 325 mg via ORAL
  Filled 2013-08-07: qty 1

## 2013-08-07 MED ORDER — GABAPENTIN 300 MG PO CAPS
300.0000 mg | ORAL_CAPSULE | Freq: Two times a day (BID) | ORAL | Status: DC
  Administered 2013-08-07: 300 mg via ORAL
  Filled 2013-08-07 (×2): qty 1

## 2013-08-07 MED ORDER — ATENOLOL 25 MG PO TABS
50.0000 mg | ORAL_TABLET | Freq: Every day | ORAL | Status: DC
  Administered 2013-08-08: 50 mg via ORAL
  Filled 2013-08-07 (×3): qty 1
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
## 2013-08-07 NOTE — Progress Notes (Signed)
Found pt resting in bed at start of shift. Spoke with him 1:1. He reports all over generalized pain of an 8/10. States he is having slight tremor and anxiety (CIWA 3). BP elevated. Per ativan protocol, ativan 2mg  given mostly due to elevated pressure. Also tylenol given with mild relief. On reassess, pt's BP is decreased at 150/92 manually. Pt is cooperative and pleasant. Attended group. Denies SI/HI/AVH and remains safe. Jamie Kato

## 2013-08-07 NOTE — ED Notes (Signed)
Per EMS, pt was trying to check into a drug/alcohol rehab in [place]. During the screening he mentioned he was having chest pain x several weeks. EMS was called for further evaluation. Pt resting quietly in NAD. No sob, n/v, diaphoresis, dizziness, weakness or change in LOC.

## 2013-08-07 NOTE — Tx Team (Signed)
Initial Interdisciplinary Treatment Plan  PATIENT STRENGTHS: (choose at least two) Average or above average intelligence Communication skills Supportive family/friends  PATIENT STRESSORS: Financial difficulties Health problems Marital or family conflict Medication change or noncompliance Substance abuse   PROBLEM LIST: Problem List/Patient Goals Date to be addressed Date deferred Reason deferred Estimated date of resolution  Pt. Is wish to detox      Pt. Is hoping to receive assistance to get back on his medications and find housing      Coping skills for depression      coping skills to replace ETOH and Substance abuse                                     DISCHARGE CRITERIA:  Adequate post-discharge living arrangements Improved stabilization in mood, thinking, and/or behavior Motivation to continue treatment in a less acute level of care Need for constant or close observation no longer present Reduction of life-threatening or endangering symptoms to within safe limits Verbal commitment to aftercare and medication compliance Withdrawal symptoms are absent or subacute and managed without 24-hour nursing intervention  PRELIMINARY DISCHARGE PLAN: Attend PHP/IOP Attend 12-step recovery group Outpatient therapy Return to previous living arrangement  PATIENT/FAMIILY INVOLVEMENT: This treatment plan has been presented to and reviewed with the patient, [name], and/or family member,  .  The patient and family have been given the opportunity to ask questions and make suggestions.  [name] 08/07/2013, 5:02 PM

## 2013-08-07 NOTE — ED Notes (Signed)
EKG old and new given to EDP,Otter,MD.

## 2013-08-07 NOTE — BH Assessment (Signed)
Patient accepted by Charmaine Downs NP to bed #306-1, MD [place]. Sharyn Lull RN notified.

## 2013-08-07 NOTE — Progress Notes (Signed)
Patient did attend the evening speaker AA meeting.  
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
## 2013-08-07 NOTE — ED Notes (Signed)
Pt aaox3.  Pt reports SI with plan to "take a lot of meds."  Pt denies HI/AH/VH.

## 2013-08-07 NOTE — ED Notes (Signed)
Bed: WA25 Expected date:  Expected time:  Means of arrival:  Comments: EMS/52 yo male-depressed-wants detox/SI/chest pain for 2 weeks-using crack
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
## 2013-08-07 NOTE — ED Notes (Signed)
Transfer to [facility] via hospital security and mental health tech. Ambulatory without difficulty. No complaints voiced. Belongings bag given to security.

## 2013-08-07 NOTE — ED Notes (Signed)
Report called to Vira Browns RN. Has been accepted to [facility] by Dr.Lugo to room 306-1. Will be transported by hospital security and mental health tech.

## 2013-08-07 NOTE — ED Notes (Addendum)
Pts belongings including a pair of jeans, underwear, tennis shoes, white socks, a brown belt, and a white t-shirt were placed in a belongings bag. Also included a wallet containing a drivers license and other paper but no money or credit cards or other valuables. Belongings placed in [facility] locker number 31.

## 2013-08-08 DIAGNOSIS — F1410 Cocaine abuse, uncomplicated: Secondary | ICD-10-CM

## 2013-08-08 DIAGNOSIS — F10239 Alcohol dependence with withdrawal, unspecified: Principal | ICD-10-CM

## 2013-08-08 DIAGNOSIS — F1020 Alcohol dependence, uncomplicated: Secondary | ICD-10-CM

## 2013-08-08 DIAGNOSIS — F1994 Other psychoactive substance use, unspecified with psychoactive substance-induced mood disorder: Secondary | ICD-10-CM | POA: Diagnosis present

## 2013-08-08 DIAGNOSIS — F10939 Alcohol use, unspecified with withdrawal, unspecified: Principal | ICD-10-CM

## 2013-08-08 DIAGNOSIS — F329 Major depressive disorder, single episode, unspecified: Secondary | ICD-10-CM

## 2013-08-08 LAB — GLUCOSE, CAPILLARY
Glucose-Capillary: 159 mg/dL — ABNORMAL HIGH (ref 70–99)
Glucose-Capillary: 187 mg/dL — ABNORMAL HIGH (ref 70–99)
Glucose-Capillary: 153 mg/dL — ABNORMAL HIGH (ref 70–99)

## 2013-08-08 MED ORDER — CHLORDIAZEPOXIDE HCL 25 MG PO CAPS: 25.0000 mg | ORAL_CAPSULE | Freq: Every day | ORAL | Status: DC

## 2013-08-08 MED ORDER — CHLORDIAZEPOXIDE HCL 25 MG PO CAPS
25.0000 mg | ORAL_CAPSULE | Freq: Four times a day (QID) | ORAL | Status: DC
  Administered 2013-08-08 – 2013-08-09 (×2): 25 mg via ORAL
  Filled 2013-08-08 (×3): qty 1

## 2013-08-08 MED ORDER — CLONIDINE HCL 0.2 MG PO TABS
0.2000 mg | ORAL_TABLET | Freq: Two times a day (BID) | ORAL | Status: DC
  Administered 2013-08-08: 0.2 mg via ORAL
  Filled 2013-08-08: qty 1

## 2013-08-08 MED ORDER — CHLORDIAZEPOXIDE HCL 25 MG PO CAPS
25.0000 mg | ORAL_CAPSULE | Freq: Three times a day (TID) | ORAL | Status: DC
  Filled 2013-08-08: qty 1

## 2013-08-08 MED ORDER — ONDANSETRON 4 MG PO TBDP
4.0000 mg | ORAL_TABLET | Freq: Four times a day (QID) | ORAL | Status: DC | PRN
  Administered 2013-08-08: 4 mg via ORAL

## 2013-08-08 MED ORDER — INSULIN ASPART 100 UNIT/ML ~~LOC~~ SOLN
4.0000 [IU] | Freq: Three times a day (TID) | SUBCUTANEOUS | Status: DC
  Administered 2013-08-08 – 2013-08-09 (×2): 4 [IU] via SUBCUTANEOUS
  Filled 2013-08-08: qty 1

## 2013-08-08 MED ORDER — INDOMETHACIN 25 MG PO CAPS: 50.0000 mg | ORAL_CAPSULE | Freq: Once | ORAL | Status: DC

## 2013-08-08 MED ORDER — CHLORDIAZEPOXIDE HCL 25 MG PO CAPS: 25.0000 mg | ORAL_CAPSULE | Freq: Four times a day (QID) | ORAL | Status: DC | PRN

## 2013-08-08 MED ORDER — BUTALBITAL-APAP-CAFFEINE 50-325-40 MG PO TABS: 2.0000 | ORAL_TABLET | Freq: Once | ORAL | Status: DC

## 2013-08-08 MED ORDER — ADULT MULTIVITAMIN W/MINERALS CH
1.0000 | ORAL_TABLET | Freq: Every day | ORAL | Status: DC
  Administered 2013-08-09: 1 via ORAL
  Filled 2013-08-08 (×3): qty 1

## 2013-08-08 MED ORDER — INSULIN ASPART 100 UNIT/ML ~~LOC~~ SOLN
0.0000 [IU] | Freq: Three times a day (TID) | SUBCUTANEOUS | Status: DC
  Administered 2013-08-08 – 2013-08-09 (×2): 3 [IU] via SUBCUTANEOUS
  Filled 2013-08-08: qty 1

## 2013-08-08 MED ORDER — CHLORDIAZEPOXIDE HCL 25 MG PO CAPS: 25.0000 mg | ORAL_CAPSULE | ORAL | Status: DC

## 2013-08-08 MED ORDER — LOPERAMIDE HCL 2 MG PO CAPS: 2.0000 mg | ORAL_CAPSULE | ORAL | Status: DC | PRN

## 2013-08-08 MED ORDER — THIAMINE HCL 100 MG/ML IJ SOLN: 100.0000 mg | Freq: Once | INTRAMUSCULAR | Status: DC

## 2013-08-08 MED ORDER — VITAMIN B-1 100 MG PO TABS
100.0000 mg | ORAL_TABLET | Freq: Every day | ORAL | Status: DC
  Administered 2013-08-09: 100 mg via ORAL
  Filled 2013-08-08 (×3): qty 1

## 2013-08-08 MED ORDER — HYDROXYZINE HCL 25 MG PO TABS: 25.0000 mg | ORAL_TABLET | Freq: Four times a day (QID) | ORAL | Status: DC | PRN
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
## 2013-08-08 NOTE — Progress Notes (Signed)
[name] is seen OOB UAL on the 300 hall today...tolerated fair. HE says he feels " ok". He denies complications of his withdrawal and says he is worried about his high blood pressure.    A HE spent most of his day...resting in his bed. NP (AN) is apprised of his BP. He is medicated per MD order and he tolerates this without diff.   R At 1800, this nurse spoke with AN about her paging med consult to assess pt's BP.Answering service called unit at [phone] and relayed that consult must be MD to MD. This nurse left voicemail message on NP's voicemail relaying this inrfo to her and it is passed on to oncomng night shift nurse.

## 2013-08-08 NOTE — Progress Notes (Signed)
[facility] Group Notes:  (Nursing/MHT/Case Management/Adjunct)  Date:  08/08/2013  Time:  6:50 PM  Type of Therapy:  Psychoeducational Skills  Participation Level:  Did Not Attend   Clint Bolder 08/08/2013, 6:50 PM

## 2013-08-08 NOTE — BHH Group Notes (Signed)
[facility] Group Notes:  (Clinical Social Work)  08/08/2013  10:00-11:00AM  Summary of Progress/Problems:   The main focus of today's process group was to   identify the patient's current support system and decide on other supports that can be put in place.  The picture on workbook was used to discuss why additional supports are needed, and a hand-out was distributed with four definitions/levels of support, then used to talk about how patients have given and received all different kinds of support.  An emphasis was placed on using counselor, doctor, therapy groups, 12-step groups, and problem-specific support groups to expand supports.  The patient slept through much of group, although was easily awakened.  He was call out to see doctor prior to answering questions at end of group,  Type of Therapy:  Process Group with Motivational Interviewing  Participation Level:  None  Participation Quality:  Drowsy and Inattentive  Affect:  Blunted  Cognitive:  Oriented  Insight:  Limited  Engagement in Therapy:  None  Modes of Intervention:   Education, Support and Processing, Activity  [name], LCSW 08/08/2013, 12:57 PM

## 2013-08-08 NOTE — ED Notes (Signed)
Checked CBG [id number] notified

## 2013-08-08 NOTE — BHH Suicide Risk Assessment (Signed)
Suicide Risk Assessment  Admission Assessment     Nursing information obtained from:  Patient Demographic factors:  Male;Low socioeconomic status;Unemployed Current Mental Status:  Suicide plan;Self-harm thoughts;Intention to act on suicide plan;Belief that plan would result in death Loss Factors:  Decline in physical health;Financial problems / change in socioeconomic status Historical Factors:  Family history of mental illness or substance abuse Risk Reduction Factors:     CLINICAL FACTORS:   Severe Anxiety and/or Agitation Depression:   Anhedonia Comorbid alcohol abuse/dependence Hopelessness Impulsivity Insomnia Recent sense of peace/wellbeing Severe Alcohol/Substance Abuse/Dependencies Unstable or Poor Therapeutic Relationship Previous Psychiatric Diagnoses and Treatments Medical Diagnoses and Treatments/Surgeries  COGNITIVE FEATURES THAT CONTRIBUTE TO RISK:  Closed-mindedness Loss of executive function Polarized thinking Thought constriction (tunnel vision)    SUICIDE RISK:   Moderate:  Frequent suicidal ideation with limited intensity, and duration, some specificity in terms of plans, no associated intent, good self-control, limited dysphoria/symptomatology, some risk factors present, and identifiable protective factors, including available and accessible social support.  PLAN OF CARE: Patient admitted voluntarily and emergently from [facility] for polysubstance abuse and substance induced mood disorder.   I certify that inpatient services furnished can reasonably be expected to improve the patient's condition.   [name],[name] R. 08/08/2013, 11:06 AM

## 2013-08-08 NOTE — Progress Notes (Signed)
@  2110Agustina Caroli, NP called back to unit asking that pt be evaluated for transfer to ED if his BP worsens. Aggie states she has not received a call from [facility] to complete referral so pt can be seen.    @2115 : [name] manual BP obtained. 178/112 sitting, 180/120 standing. Pt complaining of pressure in head, dizziness and blurred vision. Denies chest pain. Pt does state he was noncompliant with his BP meds prior to admit due to his hopelessness and depression. Covering provider Darlyne Russian PA paged and info reviewed. Order received to recheck BP in 15 mins after pt has been lying/resting in room. Pt currently doing so. Jamie Kato

## 2013-08-08 NOTE — Progress Notes (Signed)
NUTRITION ASSESSMENT  Pt identified as at risk on the Malnutrition Screen Tool  INTERVENTION: 1. Educated patient on the importance of nutrition and encouraged intake of food and beverages. 2. Discussed weight goals. 3. Supplements: thiamine daily   NUTRITION DIAGNOSIS: Unintentional weight loss related to sub-optimal intake as evidenced by pt report.   Goal: Pt to meet >/= 90% of their estimated nutrition needs.  Monitor:  PO intake  Assessment:  Patient admitted for detox from etoh and crack cocaine.  Hx includes DM, HTN, bipolar, schizophrenia.    Patient reports good intake currently.  Variable intake prior to admit secondary to drug and etoh abuse.  Patient states he is aware of having diabetes but was not taking any medicine for this or changing diet prior to admit.  Drank sugar containing drinks.  Reports no recent weight change but per chart, patient with a 5% weight loss in the past month.  52 y.o. male  Height: Ht Readings from Last 1 Encounters:  08/07/13 5\' 11"  (1.803 m)    Weight: Wt Readings from Last 1 Encounters:  08/07/13 200 lb (90.719 kg)    Weight Hx: Wt Readings from Last 10 Encounters:  08/07/13 200 lb (90.719 kg)  07/10/12 210 lb (95.255 kg)    BMI:  Body mass index is 27.91 kg/(m^2). Pt meets criteria for overweight based on current BMI.  Estimated Nutritional Needs: Kcal: 25-30 kcal/kg Protein: > 1 gram protein/kg Fluid: 1 ml/kcal  Diet Order: Carb Control Pt is also offered choice of unit snacks mid-morning and mid-afternoon.  Pt is eating as desired.   Lab results and medications reviewed.   Antonieta Iba, RD, LDN Clinical Inpatient Dietitian Pager:  [phone] Weekend and after hours pager:  [phone]

## 2013-08-08 NOTE — BHH Group Notes (Signed)
[facility] Group Notes:  (Nursing/MHT/Case Management/Adjunct)  Date:  08/08/2013  Time:  3:04 PM  Type of Therapy:  Psychoeducational Skills  Participation Level:  Did Not Attend   Ventura Sellers 08/08/2013, 3:04 PM
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
## 2013-08-08 NOTE — H&P (Signed)
Psychiatric Admission Assessment Adult  Patient Identification:  [name]  Date of Evaluation:  08/08/2013  Chief Complaint:  MOOD DISORDER NOS  History of Present Illness: This is an admission assessment for this 52 year old African-American male. Admitted to [facility] from the [facility] with complaints of increased depression and suicide attempts by hanging. Patient reports, "The ambulance took me to the hospital yesterday. The [name] people called the ambulance for me because I was suicidal. I became suicidal on Friday. My daughter had taken all my money. I did not have enough money to pay my bills or buy stuff. I felt sad and suicidal. I also attempted suicide by getting high on crack and alcohol. I drank a lot of beer too. I have been an alcoholic x 20 years. I spent 5 hours on Friday binge drinking and smoking a lot of crack. I'm still suicidal sometimes. Right now, I feel shaky and sad".  Jenetta DownerHarrell Gave is alert and oriented. However, there seem to be inconsistencies between  Reports he presented during this assessments and the ED reports. He may be intellectually limited?   Elements:  Location:  [facility] adult unit.. Quality:  "I got the shakes". Severity:  Severe. Timing:  Started last Friday (2 days ago).. Duration:  Chronic alcoholism/drug use.. Context:  "I was suicidal because my daughter took my money, I did not have money to pay bills, I got sad, felt suicidal".  Associated Signs/Synptoms: Depression Symptoms:  depressed mood, suicidal attempt, anxiety,  (Hypo) Manic Symptoms:  Impulsivity,  Anxiety Symptoms:  Excessive Worry,  Psychotic Symptoms:  Hallucinations: None  PTSD Symptoms: Had a traumatic exposure:  None reported  Psychiatric Specialty Exam: Physical Exam  Constitutional: He appears well-developed.  HENT:  Head: Normocephalic.  Eyes: Pupils are equal, round, and reactive to light.  Neck: Normal range of motion.  Cardiovascular: Normal  rate.   Respiratory: Effort normal.  GI: Soft.  Musculoskeletal: Normal range of motion.  Neurological: He is alert.  Skin: Skin is warm and dry.  Psychiatric: Thought content normal. His mood appears anxious (rated#8). His speech is delayed. He is slowed. Cognition and memory are normal. He expresses impulsivity. He exhibits a depressed mood (rated #9).    Review of Systems  Constitutional: Positive for chills and malaise/fatigue.  HENT: Negative.   Eyes: Negative.   Respiratory: Negative.   Cardiovascular: Negative.        Increased high blood pressure rates  Genitourinary: Negative.   Musculoskeletal: Negative.   Skin: Negative.   Neurological: Positive for tremors.  Psychiatric/Behavioral: Positive for depression (rate #10), suicidal ideas ("Sometimes") and substance abuse (Alcoholism, Cocaine abuse). Negative for hallucinations and memory loss. The patient is nervous/anxious (Rated #8). The patient does not have insomnia.     Blood pressure 153/90, pulse 73, temperature 98.1 F (36.7 C), temperature source Oral, resp. rate 20, height 5\' 11"  (1.803 m), weight 90.719 kg (200 lb).Body mass index is 27.91 kg/(m^2).  General Appearance: Disheveled  Eye Contact::  Poor  Speech: Slow, but clear  Volume:  Decreased  Mood:  Anxious and Depressed  Affect:  Restricted  Thought Process:  Tangential and illogical  Orientation:  Full (Time, Place, and Person)  Thought Content:  Rumination  Suicidal Thoughts:  Yes.  without intent/plan  Homicidal Thoughts:  No  Memory:  Immediate;   Fair Recent;   Fair Remote;   Fair  Judgement:  Impaired  Insight:  Lacking  Psychomotor Activity:  Tremor  Concentration:  Fair  Recall:  Fair  Akathisia:  No  Handed:  Right  AIMS (if indicated):     Assets:  Desire for Improvement  Sleep:  Number of Hours: 6.25    Past Psychiatric History: Diagnosis: Alcohol Withdrawal (291.81), Alcohol dependence, Cocaine abuse, MDD  Hospitalizations: [facility]   Outpatient Care: [name]  Substance Abuse Care: None reported  Self-Mutilation: Denies  Suicidal Attempts: Admits attempts by hanging and thoughts  Violent Behaviors: Attempted to hang himself   Past Medical History:   Past Medical History  Diagnosis Date  . Drug abuse   . Alcohol abuse   . Diabetes mellitus   . Hypertension   . Arthritis   . Gout    Cardiac History:  HTN  Allergies:  No Known Allergies  PTA Medications: Prescriptions prior to admission  Medication Sig Dispense Refill  . amLODipine (NORVASC) 10 MG tablet Take 10 mg by mouth daily.      Marland Kitchen aspirin EC 81 MG tablet Take 81 mg by mouth daily.      Marland Kitchen atenolol (TENORMIN) 50 MG tablet Take 50 mg by mouth daily.      . cloNIDine (CATAPRES) 0.1 MG tablet Take 0.1 mg by mouth 2 (two) times daily.      Marland Kitchen gabapentin (NEURONTIN) 300 MG capsule Take 300 mg by mouth 2 (two) times daily.      . indomethacin (INDOCIN) 25 MG capsule Take 1 capsule (25 mg total) by mouth 3 (three) times daily as needed.  30 capsule  0  . metFORMIN (GLUCOPHAGE) 500 MG tablet Take 500 mg by mouth daily.        Previous Psychotropic Medications:  Medication/Dose  See medication lists               Substance Abuse History in the last 12 months:  yes  Consequences of Substance Abuse: Medical Consequences:  Liver damage, Possible death by overdose Legal Consequences:  Arrests, jail time, Loss of driving privilege. Family Consequences:  Family discord, divorce and or separation.  Social History:  reports that he has been smoking Cigarettes.  He has been smoking about 0.25 packs per day. He has never used smokeless tobacco. He reports that he drinks about 1.8 ounces of alcohol per week. He reports that he uses illicit drugs (Cocaine and Methamphetamines). Additional Social History: Pain Medications: see PTA Prescriptions: see PTA History of alcohol / drug use?: Yes 1 - Age of First Use: 52yrs old 1 - Amount (size/oz): 2/040 ounces day  has changed mind during this assessment 1 - Frequency: daily 1 - Duration: 8yrs. 1 - Last Use / Amount: yesterday 08/06/2013 Name of Substance 2: crack 2 - Age of First Use: 20's 2 - Amount (size/oz): varies 2 - Frequency: when I get it / when I have the money 2 - Duration: years off and on 2 - Last Use / Amount: 08-06-2013 Name of Substance 3: methamphetamines 3 - Age of First Use: unsure 3 - Amount (size/oz): what I can get 3 - Frequency: When I get it 3 - Duration: Unsure 3 - Last Use / Amount: Unsure  Current Place of Residence: [place], [place]   Place of Birth: [place], [place]  Family Members: "My 7 children'  Marital Status:  Separated  Children: 7  Sons: 3  Daughters: 4  Relationships: Separated  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  Legal History: Denies any pending charges  Hobbies/Interests: NA  Family History:   Family History  Problem Relation Age of Onset  . Family history unknown: Yes    Results for orders placed during the hospital encounter of 08/07/13 (from the past 72 hour(s))  GLUCOSE, CAPILLARY     Status: Abnormal   Collection Time    08/07/13  9:14 PM      Result Value Range   Glucose-Capillary 182 (*) 70 - 99 mg/dL   Comment 1 Notify RN     Comment 2 Documented in Chart    GLUCOSE, CAPILLARY     Status: Abnormal   Collection Time    08/08/13  6:07 AM      Result Value Range   Glucose-Capillary 141 (*) 70 - 99 mg/dL   Comment 1 Notify RN     Psychological Evaluations:  Assessment:   DSM5:  Schizophrenia Disorders:  NA Obsessive-Compulsive Disorders:  NA Trauma-Stressor Disorders:  NA Substance/Addictive Disorders:  Alcohol Withdrawal (291.81), Alcohol dependence, Cocaine abuse Depressive Disorders:  Major Depressive Disorder -  Moderate (296.22)  AXIS I:  Alcohol Withdrawal (291.81), Alcohol dependence, Cocaine abuse, Major depression AXIS II:  Deferred AXIS III:   Past Medical History  Diagnosis Date  . Drug abuse   . Alcohol abuse   . Diabetes mellitus   . Hypertension   . Arthritis   . Gout    AXIS IV:  other psychosocial or environmental problems and Chronic substance abuse issues AXIS V:  11-20 some danger of hurting self or others possible OR occasionally fails to maintain minimal personal hygiene OR gross impairment in communication  Treatment Plan/Recommendations: 1. Admit for crisis management and stabilization, estimated length of stay 3-5 days.  2. Medication management to reduce current symptoms to base line and improve the patient's overall level of functioning  3. Treat health problems as indicated.  4. Develop treatment plan to decrease risk of relapse upon discharge and the need for readmission.  5. Psycho-social education regarding relapse prevention and self care.  6. Health care follow up as needed for medical problems.  7. Review, reconcile, and reinstate any pertinent home medications for other health issues where appropriate. 8. Call for consults with hospitalist for any additional specialty patient care services as needed.  Treatment Plan Summary: Daily contact with patient to assess and evaluate symptoms and progress in treatment Medication management  Current Medications:  Current Facility-Administered Medications  Medication Dose Route Frequency Provider Last Rate Last Dose  . acetaminophen (TYLENOL) tablet 650 mg  650 mg Oral Q6H PRN Delfin Gant, NP   650 mg at 08/07/13 1951  . alum & mag hydroxide-simeth (MAALOX/MYLANTA) 200-200-20 MG/5ML suspension 30 mL  30 mL Oral Q4H PRN Delfin Gant, NP      . amLODipine (NORVASC) tablet 10 mg  10 mg Oral Daily Delfin Gant, NP   10 mg at 08/08/13 0759  . aspirin EC tablet 81 mg  81 mg Oral Daily Delfin Gant, NP    81 mg at 08/08/13 0759  . atenolol (TENORMIN) tablet 50 mg  50 mg Oral Daily Delfin Gant, NP   50 mg at 08/08/13 0759  . cloNIDine (CATAPRES) tablet 0.1 mg  0.1 mg Oral BID Delfin Gant, NP   0.1 mg at 08/08/13 0759  . famotidine (PEPCID) tablet 20 mg  20 mg Oral BID Delfin Gant, NP   20 mg at 08/08/13 0759  . gabapentin (NEURONTIN)  capsule 300 mg  300 mg Oral BID Delfin Gant, NP   300 mg at 08/07/13 1830  . ibuprofen (ADVIL,MOTRIN) tablet 600 mg  600 mg Oral Q8H PRN Delfin Gant, NP      . LORazepam (ATIVAN) tablet 0-4 mg  0-4 mg Oral Q6H Delfin Gant, NP   1 mg at 08/08/13 0643   Followed by  . [START ON 08/10/2013] LORazepam (ATIVAN) tablet 0-4 mg  0-4 mg Oral Q12H Delfin Gant, NP      . magnesium hydroxide (MILK OF MAGNESIA) suspension 30 mL  30 mL Oral Daily PRN Delfin Gant, NP      . metFORMIN (GLUCOPHAGE) tablet 500 mg  500 mg Oral Q breakfast Delfin Gant, NP   500 mg at 08/08/13 0800  . ondansetron (ZOFRAN) tablet 4 mg  4 mg Oral Q8H PRN Delfin Gant, NP      . risperiDONE (RISPERDAL) tablet 2 mg  2 mg Oral Daily Delfin Gant, NP   2 mg at 08/08/13 0759  . thiamine (VITAMIN B-1) tablet 100 mg  100 mg Oral Daily Delfin Gant, NP   100 mg at 08/08/13 0759   Or  . thiamine (B-1) injection 100 mg  100 mg Intravenous Daily Delfin Gant, NP      . traZODone (DESYREL) tablet 50 mg  50 mg Oral QHS PRN,MR X 1 Josephine C Onuoha, NP      . zolpidem (AMBIEN) tablet 5 mg  5 mg Oral QHS PRN Delfin Gant, NP        Observation Level/Precautions:  15 minute checks  Laboratory:  Reviewed ED lab findings on file  Psychotherapy:  Group sessions,  Medications:  See medicationmlists  Consultations:  As needed  Discharge Concerns:    Estimated LOS: 3-5 days  Other:     I certify that inpatient services furnished can reasonably be expected to improve the patient's condition.   Encarnacion Slates,  PMHNP-BC 9/21/201410:44 AM  Patient seen personally and completed suicide risk assessment, case discussed with a physician extender and made treatment plan.Reviewed the information documented and agree with the treatment plan.  Latricia Cerrito,JANARDHAHA R. 08/08/2013 3:59 PM

## 2013-08-08 NOTE — ED Provider Notes (Signed)
CSN: [id number]     Arrival date & time 08/08/13  2227 History   First MD Initiated Contact with Patient 08/08/13 2256     No chief complaint on file.  (Consider location/radiation/quality/duration/timing/severity/associated sxs/prior Treatment) HPI 52 yo male presents to the ER as a transfer from [facility] due to elevated blood pressure, headache,and blurred vision.  Pt seen by me two nights ago at [facility], with chief complaint of detox from alcohol, cocaine, and SI, also c/o chest pain x 2 weeks.  Pt has h/o HTN, DM, gout, polysubstance abuse.  Pt requests that I give him hydrocodone for his gout pain.  Pt denies any chest pain, sob, weakness, numbness.  Pt reports he has had intermittent headaches and blurred vision "when I get upset and my blood pressure goes up".  Pt had been noncompliant with his medications for some time.  Pt currently scheduled to receive norvasc 10 mg qd, tenormin 50 mg qd, and clonidine 0.1 mg bid.  He is on metformin 500 mg qd, and SSI.  Pt c/o pain to bilateral ankles, knees, hips.    Past Medical History  Diagnosis Date  . Drug abuse   . Alcohol abuse   . Diabetes mellitus   . Hypertension   . Arthritis   . Gout    Past Surgical History  Procedure Laterality Date  . Mandible reconstruction     Family History  Problem Relation Age of Onset  . Family history unknown: Yes   History  Substance Use Topics  . Smoking status: Current Every Day Smoker -- 0.25 packs/day    Types: Cigarettes  . Smokeless tobacco: Never Used  . Alcohol Use: 1.8 oz/week    3 Cans of beer per week    Review of Systems  All other systems reviewed and are negative.  other than listed in hpi  Allergies  Review of patient's allergies indicates no known allergies.  Home Medications   Current Outpatient Rx  Name  Route  Sig  Dispense  Refill  . amLODipine (NORVASC) 10 MG tablet   Oral   Take 10 mg by mouth daily.         Marland Kitchen aspirin EC 81 MG tablet   Oral   Take 81 mg by mouth daily.         Marland Kitchen atenolol (TENORMIN) 50 MG tablet   Oral   Take 50 mg by mouth daily.         . cloNIDine (CATAPRES) 0.1 MG tablet   Oral   Take 0.1 mg by mouth 2 (two) times daily.         Marland Kitchen gabapentin (NEURONTIN) 300 MG capsule   Oral   Take 300 mg by mouth 2 (two) times daily.         . indomethacin (INDOCIN) 25 MG capsule   Oral   Take 1 capsule (25 mg total) by mouth 3 (three) times daily as needed.   30 capsule   0   . metFORMIN (GLUCOPHAGE) 500 MG tablet   Oral   Take 500 mg by mouth daily.          BP 154/100  Pulse 73  Temp(Src) 97.7 F (36.5 C) (Oral)  Resp 20  Ht 5\' 11"  (1.803 m)  Wt 200 lb (90.719 kg)  BMI 27.91 kg/m2  SpO2 100% Physical Exam  Nursing note and vitals reviewed. Constitutional: He is oriented to person, place, and time. He appears well-developed and well-nourished.  HENT:  Head:  Normocephalic and atraumatic.  Right Ear: External ear normal.  Left Ear: External ear normal.  Nose: Nose normal.  Mouth/Throat: Oropharynx is clear and moist.  Eyes: Conjunctivae and EOM are normal. Pupils are equal, round, and reactive to light.  Neck: Normal range of motion. Neck supple. No JVD present. No tracheal deviation present. No thyromegaly present.  Cardiovascular: Normal rate, regular rhythm, normal heart sounds and intact distal pulses.  Exam reveals no gallop and no friction rub.   No murmur heard. Pulmonary/Chest: Effort normal and breath sounds normal. No stridor. No respiratory distress. He has no wheezes. He has no rales. He exhibits no tenderness.  Abdominal: Soft. Bowel sounds are normal. He exhibits no distension and no mass. There is no tenderness. There is no rebound and no guarding.  Musculoskeletal: Normal range of motion. He exhibits no edema and no tenderness.  Lymphadenopathy:    He has no cervical adenopathy.  Neurological: He is alert and oriented to person, place, and time. He has normal reflexes. No  cranial nerve deficit. He exhibits normal muscle tone. Coordination normal.  Skin: Skin is warm and dry. No rash noted. No erythema. No pallor.  Psychiatric: He has a normal mood and affect. His behavior is normal. Judgment and thought content normal.    ED Course  Procedures (including critical care time) Labs Review Labs Reviewed  GLUCOSE, CAPILLARY - Abnormal; Notable for the following:    Glucose-Capillary 182 (*)    All other components within normal limits  GLUCOSE, CAPILLARY - Abnormal; Notable for the following:    Glucose-Capillary 141 (*)    All other components within normal limits  GLUCOSE, CAPILLARY - Abnormal; Notable for the following:    Glucose-Capillary 159 (*)    All other components within normal limits  GLUCOSE, CAPILLARY - Abnormal; Notable for the following:    Glucose-Capillary 187 (*)    All other components within normal limits  GLUCOSE, CAPILLARY - Abnormal; Notable for the following:    Glucose-Capillary 153 (*)    All other components within normal limits  GLUCOSE, CAPILLARY - Abnormal; Notable for the following:    Glucose-Capillary 223 (*)    All other components within normal limits   Imaging Review Dg Chest Port 1 View  08/07/2013   CLINICAL DATA:  Chest pain.  EXAM: PORTABLE CHEST - 1 VIEW  COMPARISON:  06/05/2013  FINDINGS: The heart size and mediastinal contours are within normal limits. Both lungs are clear. The visualized skeletal structures are unremarkable. No pneumothorax.  IMPRESSION: No active disease.   Electronically Signed   By: Arne Cleveland M.D.   On: 08/07/2013 04:04    Date: 08/08/2013  Rate: 67  Rhythm: normal sinus rhythm  QRS Axis: normal  Intervals: normal  ST/T Wave abnormalities: normal  Conduction Disutrbances:none  Narrative Interpretation:   Old EKG Reviewed: unchanged    MDM   1. Substance induced mood disorder   2. Cocaine abuse   3. Polysubstance abuse   4. Accelerated essential hypertension    52 yo  male with htn, hyperglycemia, chronic pain, headache and blurred vision.  Neuro exam nonfocal.  No blurred vision at this time.  Do not feel sxs are due to PRESS or HTN emergency.  Will check istat 8, ekg.  Plan to increase to 0.2 mg clonidine bid, and increase metformin to 500 bid.  Will treat headache with fioricet.   5:42 AM No signs of end organ damage on evaluation.  Pt with persistent htn despite multiple  interventions:  0.2 mg of clonidine, 10 mg of hydralazine, 40 mg of labetalol.  Unable to d/c back to [facility] due to htn, not medically clear.  No signs of alcohol withdrawal.  Spoke with Dr Alcario Drought earlier for assistance with bp management, recommended iv labetalol and starting HCTZ, will re-consult for admission/obs for bp control.  Kalman Drape, MD 08/09/13 [id number]

## 2013-08-08 NOTE — Progress Notes (Signed)
Manual BP rechecked after pt at rest. 186/120 x 2 readings. Harold Hedge PA contacted and order received to transfer to [facility] ED without sitter (pt with no SI). [facility] called and awaiting transport. Report called to [employer]. Jamie Kato

## 2013-08-08 NOTE — Progress Notes (Signed)
Psychoeducational Group Note  Date:  08/08/2013 Time:  115  Group Topic/Focus:  Living with healthy Attitudes:   The focus of this group is to help patients identify negative/unhealthy attitudes they were using prior to admission and identify positive/healthier attitudes to replace them upon discharge.  Participation Level:Engaged  Participation Quality: Fair    Affect:  Depressed  Cognitive:  Limited  Insight:  Poor  Engagement in Group: Minimal  Additional Comments:   Lauralyn Primes 08/08/2013, 3:58 PM

## 2013-08-08 NOTE — Progress Notes (Addendum)
Pt resting in bed. Spoke with him 1:1. Pt complaining of anxiety, nausea and dizziness. No vomiting at this time. BP has remained elevated and he states he feels "really bad." Observed ambulating and pt is unsteady. Fall precautions reviewed and strongly encouraged. Medicated with zofran prn. Supported, encouraged to attend AA if he feels well enough and he confirms he will. Verbalizes understanding of fall precautions. Denies any SI/HI/AVH. Remains pleasant and appropriate.   [facility] States she will call Agustina Caroli, NP as they do not accept referrals from staff nurses, only directly from provider. Jamie Kato

## 2013-08-08 NOTE — Progress Notes (Signed)
Patient did attend the evening speaker AA meeting.  

## 2013-08-09 ENCOUNTER — Encounter (HOSPITAL_COMMUNITY): Payer: Self-pay | Admitting: Family Medicine

## 2013-08-09 ENCOUNTER — Inpatient Hospital Stay (HOSPITAL_COMMUNITY)
Admission: AD | Admit: 2013-08-09 | Discharge: 2013-08-12 | DRG: 305 | Disposition: A | Discharge status: Home / Self Care | Payer: Medicaid Other | Source: Ambulatory Visit | Attending: Internal Medicine | Admitting: Internal Medicine

## 2013-08-09 DIAGNOSIS — F1994 Other psychoactive substance use, unspecified with psychoactive substance-induced mood disorder: Secondary | ICD-10-CM

## 2013-08-09 DIAGNOSIS — F17200 Nicotine dependence, unspecified, uncomplicated: Secondary | ICD-10-CM | POA: Diagnosis present

## 2013-08-09 DIAGNOSIS — E119 Type 2 diabetes mellitus without complications: Secondary | ICD-10-CM

## 2013-08-09 DIAGNOSIS — R45851 Suicidal ideations: Secondary | ICD-10-CM

## 2013-08-09 DIAGNOSIS — M109 Gout, unspecified: Secondary | ICD-10-CM

## 2013-08-09 DIAGNOSIS — F10939 Alcohol use, unspecified with withdrawal, unspecified: Secondary | ICD-10-CM

## 2013-08-09 DIAGNOSIS — F1010 Alcohol abuse, uncomplicated: Secondary | ICD-10-CM | POA: Diagnosis present

## 2013-08-09 DIAGNOSIS — I6350 Cerebral infarction due to unspecified occlusion or stenosis of unspecified cerebral artery: Secondary | ICD-10-CM

## 2013-08-09 DIAGNOSIS — Z9119 Patient's noncompliance with other medical treatment and regimen: Secondary | ICD-10-CM

## 2013-08-09 DIAGNOSIS — F10239 Alcohol dependence with withdrawal, unspecified: Secondary | ICD-10-CM

## 2013-08-09 DIAGNOSIS — R739 Hyperglycemia, unspecified: Secondary | ICD-10-CM

## 2013-08-09 DIAGNOSIS — R7309 Other abnormal glucose: Secondary | ICD-10-CM

## 2013-08-09 DIAGNOSIS — I10 Essential (primary) hypertension: Secondary | ICD-10-CM | POA: Diagnosis present

## 2013-08-09 DIAGNOSIS — F1910 Other psychoactive substance abuse, uncomplicated: Secondary | ICD-10-CM | POA: Diagnosis present

## 2013-08-09 DIAGNOSIS — F1410 Cocaine abuse, uncomplicated: Secondary | ICD-10-CM

## 2013-08-09 DIAGNOSIS — Z91199 Patient's noncompliance with other medical treatment and regimen due to unspecified reason: Secondary | ICD-10-CM

## 2013-08-09 DIAGNOSIS — I639 Cerebral infarction, unspecified: Secondary | ICD-10-CM

## 2013-08-09 DIAGNOSIS — F1020 Alcohol dependence, uncomplicated: Secondary | ICD-10-CM | POA: Diagnosis present

## 2013-08-09 DIAGNOSIS — E1159 Type 2 diabetes mellitus with other circulatory complications: Secondary | ICD-10-CM | POA: Diagnosis present

## 2013-08-09 DIAGNOSIS — M129 Arthropathy, unspecified: Secondary | ICD-10-CM | POA: Diagnosis present

## 2013-08-09 LAB — GLUCOSE, CAPILLARY
Glucose-Capillary: 153 mg/dL — ABNORMAL HIGH (ref 70–99)
Glucose-Capillary: 150 mg/dL — ABNORMAL HIGH (ref 70–99)

## 2013-08-09 LAB — TROPONIN I
Troponin I: <0.3 ng/mL (ref <0.30)
Troponin I: <0.3 ng/mL (ref <0.30)

## 2013-08-09 LAB — POCT I-STAT, CHEM 8
BUN: 11 mg/dL (ref 6–23)
Calcium, Ion: 1.19 mmol/L (ref 1.12–1.23)
Chloride: 102 mEq/L (ref 96–112)
Creatinine, Ser: 0.8 mg/dL (ref 0.50–1.35)
Glucose, Bld: 207 mg/dL — ABNORMAL HIGH (ref 70–99)
Hemoglobin: 14.3 g/dL (ref 13.0–17.0)
Potassium: 3.8 mEq/L (ref 3.5–5.1)
Sodium: 139 mEq/L (ref 135–145)

## 2013-08-09 LAB — CBC
HCT: 40.7 % (ref 39.0–52.0)
Hemoglobin: 13.7 g/dL (ref 13.0–17.0)
MCH: 29.5 pg (ref 26.0–34.0)
MCHC: 33.7 g/dL (ref 30.0–36.0)
RBC: 4.64 MIL/uL (ref 4.22–5.81)

## 2013-08-09 LAB — CREATININE, SERUM
Creatinine, Ser: 0.82 mg/dL (ref 0.50–1.35)
GFR calc non Af Amer: >90 mL/min (ref >90)

## 2013-08-09 MED ORDER — VITAMIN B-1 100 MG PO TABS
100.0000 mg | ORAL_TABLET | Freq: Every day | ORAL | Status: DC
  Administered 2013-08-09: 100 mg via ORAL

## 2013-08-09 MED ORDER — FOLIC ACID 5 MG/ML IJ SOLN: 1.0000 mg | Freq: Every day | INTRAMUSCULAR | Status: DC

## 2013-08-09 MED ORDER — SODIUM CHLORIDE 0.9 % IJ SOLN: 3.0000 mL | Freq: Two times a day (BID) | INTRAMUSCULAR | Status: DC

## 2013-08-09 MED ORDER — ADULT MULTIVITAMIN W/MINERALS CH: 1.0000 | ORAL_TABLET | Freq: Every day | ORAL | Status: DC

## 2013-08-09 MED ORDER — VITAMIN B-1 100 MG PO TABS
100.0000 mg | ORAL_TABLET | Freq: Every day | ORAL | Status: DC
  Administered 2013-08-09 – 2013-08-12 (×4): 100 mg via ORAL
  Filled 2013-08-09 (×4): qty 1

## 2013-08-09 MED ORDER — HYDRALAZINE HCL 20 MG/ML IJ SOLN
10.0000 mg | INTRAMUSCULAR | Status: DC | PRN
  Administered 2013-08-09 – 2013-08-10 (×2): 10 mg via INTRAVENOUS
  Filled 2013-08-09 (×2): qty 1

## 2013-08-09 MED ORDER — AMLODIPINE BESYLATE 10 MG PO TABS
10.0000 mg | ORAL_TABLET | Freq: Every day | ORAL | Status: DC
  Administered 2013-08-09 – 2013-08-12 (×4): 10 mg via ORAL
  Filled 2013-08-09 (×4): qty 1

## 2013-08-09 MED ORDER — LORAZEPAM 1 MG PO TABS
1.0000 mg | ORAL_TABLET | Freq: Four times a day (QID) | ORAL | Status: DC | PRN
  Administered 2013-08-10 – 2013-08-12 (×6): 1 mg via ORAL
  Filled 2013-08-09 (×6): qty 1

## 2013-08-09 MED ORDER — INDOMETHACIN 25 MG PO CAPS
50.0000 mg | ORAL_CAPSULE | Freq: Once | ORAL | Status: AC
  Administered 2013-08-09: 50 mg via ORAL
  Filled 2013-08-09: qty 2

## 2013-08-09 MED ORDER — SODIUM CHLORIDE 0.9 % IJ SOLN
3.0000 mL | Freq: Two times a day (BID) | INTRAMUSCULAR | Status: DC
  Administered 2013-08-09 – 2013-08-11 (×5): 3 mL via INTRAVENOUS

## 2013-08-09 MED ORDER — HEPARIN SODIUM (PORCINE) 5000 UNIT/ML IJ SOLN: 5000.0000 [IU] | Freq: Three times a day (TID) | INTRAMUSCULAR | Status: DC

## 2013-08-09 MED ORDER — ACETAMINOPHEN 325 MG PO TABS: 325.0000 mg | ORAL_TABLET | Freq: Four times a day (QID) | ORAL | Status: DC | PRN

## 2013-08-09 MED ORDER — LABETALOL HCL 5 MG/ML IV SOLN
20.0000 mg | Freq: Once | INTRAVENOUS | Status: AC
  Administered 2013-08-09: 20 mg via INTRAVENOUS
  Filled 2013-08-09: qty 4

## 2013-08-09 MED ORDER — SODIUM CHLORIDE 0.9 % IJ SOLN: 3.0000 mL | INTRAMUSCULAR | Status: DC | PRN

## 2013-08-09 MED ORDER — INSULIN ASPART 100 UNIT/ML ~~LOC~~ SOLN
0.0000 [IU] | Freq: Three times a day (TID) | SUBCUTANEOUS | Status: DC
  Administered 2013-08-10: 5 [IU] via SUBCUTANEOUS
  Administered 2013-08-10 (×2): 3 [IU] via SUBCUTANEOUS
  Administered 2013-08-11: 5 [IU] via SUBCUTANEOUS
  Administered 2013-08-11: 2 [IU] via SUBCUTANEOUS
  Administered 2013-08-11 – 2013-08-12 (×2): 3 [IU] via SUBCUTANEOUS
  Administered 2013-08-12: 2 [IU] via SUBCUTANEOUS

## 2013-08-09 MED ORDER — LABETALOL HCL 5 MG/ML IV SOLN: 5.0000 mg | INTRAVENOUS | Status: DC | PRN

## 2013-08-09 MED ORDER — HYDROCHLOROTHIAZIDE 25 MG PO TABS
25.0000 mg | ORAL_TABLET | Freq: Every day | ORAL | Status: DC
  Administered 2013-08-09 – 2013-08-12 (×4): 25 mg via ORAL
  Filled 2013-08-09 (×4): qty 1

## 2013-08-09 MED ORDER — HEPARIN SODIUM (PORCINE) 5000 UNIT/ML IJ SOLN
5000.0000 [IU] | Freq: Three times a day (TID) | INTRAMUSCULAR | Status: DC
  Administered 2013-08-09 – 2013-08-12 (×5): 5000 [IU] via SUBCUTANEOUS
  Filled 2013-08-09 (×11): qty 1

## 2013-08-09 MED ORDER — THIAMINE HCL 100 MG/ML IJ SOLN
100.0000 mg | Freq: Every day | INTRAMUSCULAR | Status: DC
  Filled 2013-08-09 (×2): qty 1

## 2013-08-09 MED ORDER — ONDANSETRON HCL 4 MG PO TABS: 4.0000 mg | ORAL_TABLET | Freq: Four times a day (QID) | ORAL | Status: DC | PRN

## 2013-08-09 MED ORDER — BISACODYL 5 MG PO TBEC: 5.0000 mg | DELAYED_RELEASE_TABLET | Freq: Every day | ORAL | Status: DC | PRN

## 2013-08-09 MED ORDER — ONDANSETRON HCL 4 MG/2ML IJ SOLN: 4.0000 mg | Freq: Four times a day (QID) | INTRAMUSCULAR | Status: DC | PRN

## 2013-08-09 MED ORDER — SODIUM CHLORIDE 0.9 % IV SOLN: 250.0000 mL | INTRAVENOUS | Status: DC | PRN

## 2013-08-09 MED ORDER — LORAZEPAM 1 MG PO TABS
2.0000 mg | ORAL_TABLET | Freq: Once | ORAL | Status: AC
  Administered 2013-08-09: 2 mg via ORAL
  Filled 2013-08-09: qty 2

## 2013-08-09 MED ORDER — ASPIRIN EC 81 MG PO TBEC
81.0000 mg | DELAYED_RELEASE_TABLET | Freq: Every day | ORAL | Status: DC
  Administered 2013-08-09 – 2013-08-12 (×4): 81 mg via ORAL
  Filled 2013-08-09 (×4): qty 1

## 2013-08-09 MED ORDER — ACETAMINOPHEN 325 MG PO TABS
650.0000 mg | ORAL_TABLET | Freq: Four times a day (QID) | ORAL | Status: DC | PRN
  Administered 2013-08-09 – 2013-08-12 (×8): 650 mg via ORAL
  Filled 2013-08-09 (×8): qty 2

## 2013-08-09 MED ORDER — ACETAMINOPHEN 650 MG RE SUPP: 650.0000 mg | Freq: Four times a day (QID) | RECTAL | Status: DC | PRN

## 2013-08-09 MED ORDER — CLONIDINE HCL 0.2 MG PO TABS
0.2000 mg | ORAL_TABLET | Freq: Three times a day (TID) | ORAL | Status: DC
  Administered 2013-08-09: 0.2 mg via ORAL
  Filled 2013-08-09: qty 1

## 2013-08-09 MED ORDER — ADULT MULTIVITAMIN W/MINERALS CH
1.0000 | ORAL_TABLET | Freq: Every day | ORAL | Status: DC
  Administered 2013-08-09 – 2013-08-12 (×4): 1 via ORAL
  Filled 2013-08-09 (×4): qty 1

## 2013-08-09 MED ORDER — LORAZEPAM 2 MG/ML IJ SOLN
1.0000 mg | Freq: Four times a day (QID) | INTRAMUSCULAR | Status: DC | PRN
  Administered 2013-08-10: 1 mg via INTRAVENOUS
  Filled 2013-08-09 (×2): qty 1

## 2013-08-09 MED ORDER — ATENOLOL 25 MG PO TABS
100.0000 mg | ORAL_TABLET | Freq: Every day | ORAL | Status: DC
  Administered 2013-08-09: 100 mg via ORAL
  Filled 2013-08-09: qty 4

## 2013-08-09 MED ORDER — HYDROCHLOROTHIAZIDE 25 MG PO TABS
25.0000 mg | ORAL_TABLET | Freq: Every day | ORAL | Status: DC
  Administered 2013-08-09: 25 mg via ORAL
  Filled 2013-08-09: qty 1

## 2013-08-09 MED ORDER — VITAMIN B-1 100 MG PO TABS: 100.0000 mg | ORAL_TABLET | Freq: Every day | ORAL | Status: DC

## 2013-08-09 MED ORDER — ATENOLOL 100 MG PO TABS
100.0000 mg | ORAL_TABLET | Freq: Every day | ORAL | Status: DC
  Administered 2013-08-09 – 2013-08-12 (×4): 100 mg via ORAL
  Filled 2013-08-09 (×4): qty 1

## 2013-08-09 MED ORDER — DIFLUNISAL 500 MG PO TABS
500.0000 mg | ORAL_TABLET | Freq: Two times a day (BID) | ORAL | Status: DC
  Filled 2013-08-09: qty 1

## 2013-08-09 MED ORDER — CLONIDINE HCL 0.2 MG PO TABS
0.2000 mg | ORAL_TABLET | Freq: Three times a day (TID) | ORAL | Status: DC
  Administered 2013-08-09 – 2013-08-12 (×8): 0.2 mg via ORAL
  Filled 2013-08-09 (×11): qty 1

## 2013-08-09 MED ORDER — HYDRALAZINE HCL 20 MG/ML IJ SOLN
10.0000 mg | Freq: Once | INTRAMUSCULAR | Status: AC
  Administered 2013-08-09: 10 mg via INTRAVENOUS
  Filled 2013-08-09: qty 1

## 2013-08-09 MED ORDER — FOLIC ACID 1 MG PO TABS
1.0000 mg | ORAL_TABLET | Freq: Every day | ORAL | Status: DC
Start: 2013-08-09 — End: 2013-08-12
  Administered 2013-08-09 – 2013-08-12 (×4): 1 mg via ORAL
  Filled 2013-08-09 (×4): qty 1

## 2013-08-09 NOTE — Consult Note (Signed)
Reason for Consult: Alcohol abuse and suicidal Referring Physician: Dr. Olevia Bowens Garvis is an 52 y.o. male.  HPI: Patient was seen and chart reviewed. Patient has history of chronic alcoholism and previous in patient and out patient treatment. He was initially admitted to [facility] from [facility] and than transferred to [facility] cone for acute chest pain. He has history of depression with hx of suicide attempts (3) via overdose or hanging. Patient has long hx of schizophrenia, cocaine and alcohol abuse. Reportedly he has missed last apt with [facility] and been off of his medication about three weeks prior to visit to [facility].    He reports using alcohol 3, 4 or 7 days a week, consuming 2 to 4 40 oz beers. He uses crack when he can get it but states he almost uses cocaine every day. He has not appear to have withdraw related substances. He has denies current suicide, homicidal ideations and denied A/V/H.   MSE: Patient is calm and cooperative. He has nl speech and thought process. He has denied SI/HI and has no evidence of psychosis.   Past Medical History  Diagnosis Date  . Drug abuse   . Alcohol abuse   . Diabetes mellitus   . Uncontrolled hypertension   . Arthritis   . Gout   . DTs (delirium tremens)     history of   . Crack cocaine use   . Noncompliance with medication regimen     Past Surgical History  Procedure Laterality Date  . Mandible reconstruction      Family History  Problem Relation Age of Onset  . Hypertension      Social History:  reports that he has been smoking Cigarettes.  He has been smoking about 0.25 packs per day. He has never used smokeless tobacco. He reports that he drinks about 1.8 ounces of alcohol per week. He reports that he uses illicit drugs (Cocaine and Methamphetamines).  Allergies: No Known Allergies  Medications: I have reviewed the patient's current medications.  Results for orders placed during the hospital encounter of 08/07/13 (from the past 48  hour(s))  GLUCOSE, CAPILLARY     Status: Abnormal   Collection Time    08/07/13  9:14 PM      Result Value Range   Glucose-Capillary 182 (*) 70 - 99 mg/dL   Comment 1 Notify RN     Comment 2 Documented in Chart    GLUCOSE, CAPILLARY     Status: Abnormal   Collection Time    08/08/13  6:07 AM      Result Value Range   Glucose-Capillary 141 (*) 70 - 99 mg/dL   Comment 1 Notify RN    GLUCOSE, CAPILLARY     Status: Abnormal   Collection Time    08/08/13 11:24 AM      Result Value Range   Glucose-Capillary 159 (*) 70 - 99 mg/dL   Comment 1 Notify RN    GLUCOSE, CAPILLARY     Status: Abnormal   Collection Time    08/08/13  5:23 PM      Result Value Range   Glucose-Capillary 187 (*) 70 - 99 mg/dL   Comment 1 Notify RN    GLUCOSE, CAPILLARY     Status: Abnormal   Collection Time    08/08/13  9:28 PM      Result Value Range   Glucose-Capillary 153 (*) 70 - 99 mg/dL   Comment 1 Notify RN     Comment 2 Documented  in Chart    GLUCOSE, CAPILLARY     Status: Abnormal   Collection Time    08/08/13 11:09 PM      Result Value Range   Glucose-Capillary 223 (*) 70 - 99 mg/dL   Comment 1 Notify RN    POCT I-STAT, CHEM 8     Status: Abnormal   Collection Time    08/09/13 12:40 AM      Result Value Range   Sodium 139  135 - 145 mEq/L   Potassium 3.8  3.5 - 5.1 mEq/L   Chloride 102  96 - 112 mEq/L   BUN 11  6 - 23 mg/dL   Creatinine, Ser 0.80  0.50 - 1.35 mg/dL   Glucose, Bld 207 (*) 70 - 99 mg/dL   Calcium, Ion 1.19  1.12 - 1.23 mmol/L   TCO2 27  0 - 100 mmol/L   Hemoglobin 14.3  13.0 - 17.0 g/dL   HCT 42.0  39.0 - 52.0 %  TROPONIN I     Status: None   Collection Time    08/09/13  3:36 AM      Result Value Range   Troponin I <0.30  <0.30 ng/mL   Comment:            Due to the release kinetics of cTnI,     a negative result within the first hours     of the onset of symptoms does not rule out     myocardial infarction with certainty.     If myocardial infarction is still  suspected,     repeat the test at appropriate intervals.  GLUCOSE, CAPILLARY     Status: Abnormal   Collection Time    08/09/13  8:12 AM      Result Value Range   Glucose-Capillary 153 (*) 70 - 99 mg/dL   Comment 1 Documented in Chart     Comment 2 Notify RN      No results found.  Positive for behavior problems, depression and illegal drug usage There were no vitals taken for this visit.   Assessment/Plan: Polysubstance abuse  Substance related mood disorder  Recommendation: Patient benefit from out patient psychiatric treatment at [facility] when medically stable for out patient substance abuse treatment.  Patient may benefit from following up with mental health associates and AA meetings Recommend no additional medications  Appreciate psych consult and will sign off at this time.  Rusell Meneely,JANARDHAHA R. 08/09/2013, 3:38 PM

## 2013-08-09 NOTE — H&P (Signed)
History and Physical Examination  Matthewjames Grandberry [id number] DOB: 1961-04-10 DOA: 08/07/2013  PCP: Default, Provider, MD   Chief Complaint: high BP  HPI: Job Savard is a 52 y.o. male chronic alcohol abuser and recreational drug abuser with type 2 Diabetes, Hypertension and gout that had been admitted to [facility] yesterday from the [facility] with complaints of increased depression and suicide attempts by hanging. Patient reported "The ambulance took me to the hospital yesterday. [name] called the ambulance for me because I was suicidal. I became suicidal on Friday. The patient reported that his daughter stole his disability money and he lost his home.  I felt sad and suicidal. He also attempted suicide by getting high on crack cocaine and alcohol. He has been an alcoholic for over 20 years. He spent more than 5 hours on Friday binge drinking and smoking a lot of crack per [facility] records.  He reported that he has not been taking his regular medications for a very long time.   He was subsequently sent to [facility] ED because his blood pressures have been very high and he reported that he was having some chest pain and concerned that he was having a stroke.  He does not have a known history of stroke or prior heart disease.  The ER monitored him and treated his blood pressures but they did not come down adequately.  They requested an ER admission for further observation to get his blood pressures under better control so that he could be medically cleared to return to [facility] for further psychiatric monitoring and treatment.   Past Medical History Past Medical History  Diagnosis Date  . Drug abuse   . Alcohol abuse   . Diabetes mellitus   . Uncontrolled hypertension   . Arthritis   . Gout   . DTs (delirium tremens)     history of   . Crack cocaine use   . Noncompliance with medication regimen     Past Surgical History Past Surgical History  Procedure  Laterality Date  . Mandible reconstruction      Home Meds: Prior to Admission medications   Medication Sig Start Date End Date Taking? Authorizing Provider  amLODipine (NORVASC) 10 MG tablet Take 10 mg by mouth daily.    Historical Provider, MD  aspirin EC 81 MG tablet Take 81 mg by mouth daily.    Historical Provider, MD  atenolol (TENORMIN) 50 MG tablet Take 50 mg by mouth daily.    Historical Provider, MD  cloNIDine (CATAPRES) 0.1 MG tablet Take 0.1 mg by mouth 2 (two) times daily.    Historical Provider, MD  gabapentin (NEURONTIN) 300 MG capsule Take 300 mg by mouth 2 (two) times daily.    Historical Provider, MD  indomethacin (INDOCIN) 25 MG capsule Take 1 capsule (25 mg total) by mouth 3 (three) times daily as needed. 03/09/13   Illene Labrador, PA-C  metFORMIN (GLUCOPHAGE) 500 MG tablet Take 500 mg by mouth daily.    Historical Provider, MD    Allergies: Review of patient's allergies indicates no known allergies.  Social History:  History   Social History  . Marital Status: Legally Separated    Spouse Name: N/A    Number of Children: N/A  . Years of Education: N/A   Occupational History  . Not on file.   Social History Main Topics  . Smoking status: Current Every Day Smoker -- 0.25 packs/day    Types: Cigarettes  . Smokeless tobacco:  Never Used  . Alcohol Use: 1.8 oz/week    3 Cans of beer per week  . Drug Use: Yes    Special: Cocaine, Methamphetamines     Comment: says $30 week  . Sexual Activity: Yes    Birth Control/ Protection: None   Other Topics Concern  . Not on file   Social History Narrative  . No narrative on file   Family History:  Family History  Problem Relation Age of Onset  . Hypertension      Review of Systems: The patient denies anorexia, fever, weight loss,, vision loss, decreased hearing, hoarseness,  syncope, dyspnea on exertion, peripheral edema, balance deficits, hemoptysis, abdominal pain, melena, hematochezia, severe  indigestion/heartburn, hematuria, incontinence, genital sores, muscle weakness, suspicious skin lesions, transient blindness, difficulty walking, depression, unusual weight change, abnormal bleeding, enlarged lymph nodes, angioedema, and breast masses.  All other systems reviewed and reported as negative.   Physical Exam: Blood pressure 182/119, pulse 73, temperature 97.7 F (36.5 C), temperature source Oral, resp. rate 22, height 5\' 11"  (1.803 m), weight 90.719 kg (200 lb), SpO2 100.00%. General appearance: alert, cooperative, appears stated age and no distress Head: Normocephalic, without obvious abnormality, atraumatic Eyes: negative Ears: normal TM's and external ear canals both ears Nose: Nares normal. Septum midline. Mucosa normal. No drainage or sinus tenderness., no discharge Throat: dry MM poor dentition Neck: no adenopathy, no carotid bruit, no JVD, supple, symmetrical, trachea midline and thyroid not enlarged, symmetric, no tenderness/mass/nodules Lungs: clear to auscultation bilaterally Chest wall: no tenderness Heart: S1, S2 normal Abdomen: soft, non-tender; bowel sounds normal; no masses,  no organomegaly Extremities: extremities normal, atraumatic, no cyanosis or edema Pulses: 2+ and symmetric Skin: Skin color, texture, turgor normal. No rashes or lesions Neurologic: Grossly normal  Lab  And Imaging results:  Results for orders placed during the hospital encounter of 08/07/13 (from the past 24 hour(s))  GLUCOSE, CAPILLARY     Status: Abnormal   Collection Time    08/08/13 11:24 AM      Result Value Range   Glucose-Capillary 159 (*) 70 - 99 mg/dL   Comment 1 Notify RN    GLUCOSE, CAPILLARY     Status: Abnormal   Collection Time    08/08/13  5:23 PM      Result Value Range   Glucose-Capillary 187 (*) 70 - 99 mg/dL   Comment 1 Notify RN    GLUCOSE, CAPILLARY     Status: Abnormal   Collection Time    08/08/13  9:28 PM      Result Value Range   Glucose-Capillary 153  (*) 70 - 99 mg/dL   Comment 1 Notify RN     Comment 2 Documented in Chart    GLUCOSE, CAPILLARY     Status: Abnormal   Collection Time    08/08/13 11:09 PM      Result Value Range   Glucose-Capillary 223 (*) 70 - 99 mg/dL   Comment 1 Notify RN    POCT I-STAT, CHEM 8     Status: Abnormal   Collection Time    08/09/13 12:40 AM      Result Value Range   Sodium 139  135 - 145 mEq/L   Potassium 3.8  3.5 - 5.1 mEq/L   Chloride 102  96 - 112 mEq/L   BUN 11  6 - 23 mg/dL   Creatinine, Ser 0.80  0.50 - 1.35 mg/dL   Glucose, Bld 207 (*) 70 - 99 mg/dL  Calcium, Ion 1.19  1.12 - 1.23 mmol/L   TCO2 27  0 - 100 mmol/L   Hemoglobin 14.3  13.0 - 17.0 g/dL   HCT 42.0  39.0 - 52.0 %  TROPONIN I     Status: None   Collection Time    08/09/13  3:36 AM      Result Value Range   Troponin I <0.30  <0.30 ng/mL   Impression / Plan   Uncontrolled hypertension Admit to telemetry for observation Increase atenolol to 100 mg po daily Increase clonidine to 0.2 mg po TID Start HCTZ 25 mg po daily Continue amlodipine 10 mg po daily Cycle troponin x 3   Diabetes mellitus / Hyperglycemia Monitor CBC Supplemental insulin as needed Mealtime insulin ordered Check A1c Hold Metformin while in hospital  Polysubstance abuse Psychiatric consultation requested Return to [facility] when medically stable  Cocaine abuse Substance induced mood disorder Suicidal ideations Suicide precautions Sitter at bedside  Chronic alcohol abuse / Alcohol withdrawal CIWA protocol Librium as needed MVI, thiamine, Folic Acid  Gout Stable   Irwin Brakeman MD [place], [place] 08/09/2013, 7:58 AM

## 2013-08-09 NOTE — ED Notes (Signed)
Pt believes he is having a stroke because his blood pressure is elevated and wanted to be sure we would do something so he could not have any more strokes.  Asked pt how he knew he was having a stroke as he showed no symptoms.  He was just certain because his BP was high it meant he was having stroke after stroke.  Education provided on S/S.  As the auto BP cuff went off, I watched the patients arm moving around and he was clinching his fists.  Instructed pt to hold his arm still when the cuff is taking a pressure.

## 2013-08-09 NOTE — ED Notes (Signed)
Pt sleeping, no further episodes of chest pain since arriving in ED.  Continue monitoring BP.

## 2013-08-09 NOTE — ED Notes (Signed)
Regular Diet tray ordered

## 2013-08-09 NOTE — Progress Notes (Signed)
Called ED to get report. RN unable to give report at this time. She will return call when able.

## 2013-08-09 NOTE — ED Notes (Signed)
Admitting physician at bedside

## 2013-08-09 NOTE — ED Notes (Signed)
Pt eating regular breakfast tray.

## 2013-08-09 NOTE — ED Notes (Addendum)
C/o chest pain that radiates to left shoulder.  Is very concerned about his elevated BP.  Has not had his three HTN meds x 3 weeks.  Is asking for pain medicine, specifically "hydroco--something" to help with his pain.

## 2013-08-09 NOTE — ED Notes (Signed)
Pt sleeping. [facility] called to check on plan of care.  The intention is to get his blood pressure under control and send him back to them.  Will re-evaluate before 0700.

## 2013-08-09 NOTE — ED Notes (Signed)
Report given to Chester Holstein, RN  On 2000 (2W)

## 2013-08-09 NOTE — ED Notes (Signed)
Luciano Cutter EMT sitting with patient.

## 2013-08-10 ENCOUNTER — Encounter (HOSPITAL_COMMUNITY): Payer: Self-pay | Admitting: Cardiology

## 2013-08-10 ENCOUNTER — Inpatient Hospital Stay (HOSPITAL_COMMUNITY): Payer: Medicaid Other

## 2013-08-10 LAB — COMPREHENSIVE METABOLIC PANEL
ALT: 49 U/L (ref 0–53)
AST: 89 U/L — ABNORMAL HIGH (ref 0–37)
Albumin: 3.7 g/dL (ref 3.5–5.2)
Alkaline Phosphatase: 84 U/L (ref 39–117)
Chloride: 96 mEq/L (ref 96–112)
GFR calc Af Amer: >90 mL/min (ref >90)
Potassium: 3.6 mEq/L (ref 3.5–5.1)
Sodium: 134 mEq/L — ABNORMAL LOW (ref 135–145)
Total Bilirubin: 0.3 mg/dL (ref 0.3–1.2)
Total Protein: 8 g/dL (ref 6.0–8.3)

## 2013-08-10 LAB — GLUCOSE, CAPILLARY
Glucose-Capillary: 223 mg/dL — ABNORMAL HIGH (ref 70–99)
Glucose-Capillary: 177 mg/dL — ABNORMAL HIGH (ref 70–99)
Glucose-Capillary: 249 mg/dL — ABNORMAL HIGH (ref 70–99)

## 2013-08-10 LAB — CBC
HCT: 42.2 % (ref 39.0–52.0)
MCHC: 33.9 g/dL (ref 30.0–36.0)
Platelets: 125 10*3/uL — ABNORMAL LOW (ref 150–400)
RDW: 13.6 % (ref 11.5–15.5)
WBC: 4.8 10*3/uL (ref 4.0–10.5)

## 2013-08-10 LAB — TROPONIN I: Troponin I: <0.3 ng/mL (ref <0.30)

## 2013-08-10 NOTE — Progress Notes (Signed)
Pt given 1mg  PO Ativan at this time per CIWAA score; will cont. To monitor.

## 2013-08-10 NOTE — Clinical Social Work Psych Note (Signed)
Psych CSW has placed follow up information for [facility] on the d/c summary.  Please contact Psych CSW prior to d/c to provide education and resources to pt.    Nonnie Done, [place] [phone]  Clinical Social Work

## 2013-08-10 NOTE — Care Management Note (Signed)
    Page 1 of 1   08/12/2013     4:10:31 PM   CARE MANAGEMENT NOTE 08/12/2013  Patient:  JARRIEL, SCHLUETER   Account Number:  [id number]  Date Initiated:  08/10/2013  Documentation initiated by:  Jahden Schara  Subjective/Objective Assessment:   PT ADM ON 08/09/13 WITH HTN, SUICIDAL IDEATIONS.  PTA, PT INDEPENDENT OF ADLS.     Action/Plan:   WILL FOLLOW FOR DC NEEDS AS PT PROGRESSES.  PSYCH CSW FOLLOWING FOR DISPOSITION.   Anticipated DC Date:  08/11/2013   Anticipated DC Plan:  [place] referral  Clinical Social Worker      DC Planning Services  CM consult      Choice offered to / List presented to:             Status of service:  Completed, signed off Medicare Important Message given?   (If response is "NO", the following Medicare IM given date fields will be blank) Date Medicare IM given:   Date Additional Medicare IM given:    Discharge Disposition:  HOME/SELF CARE  Per UR Regulation:  Reviewed for med. necessity/level of care/duration of stay  If discussed at Long Length of Stay Meetings, dates discussed:    Comments:  08/12/13 Vaudie Engebretsen,RN,BSN [id number] REFERRAL TO CSW, AS PT STATES HE NEEDS A RIDE TO [place].

## 2013-08-10 NOTE — Progress Notes (Signed)
CSW received referral for assistance in outpatient substance use resources. CSW passed this on to Psych CSW who will follow up with patient.  [name] Caprice, MSW, [place]

## 2013-08-10 NOTE — Progress Notes (Signed)
[facility] PROGRESS NOTE  [name] [id number] DOB: 15-Apr-1961 DOA: 08/09/2013 PCP: Default, Provider, MD  Assessment/Plan:  1-Uncontrolled hypertension  Iatenolol ncreased to 100 mg po daily on 9-22 clonidine increase to to 0.2 mg po TID on 9-22 Started on HCTZ 25 mg po daily on 9-22 Continue amlodipine 10 mg po daily   troponin x 2 negative Blood pressure better controlled.   Chronic alcohol abuse / Alcohol withdrawal  CIWA protocol  Patient with tremors.  Has received 2 dose of ativan.  MVI, thiamine, Folic Acid   Headaches:  Will check CT head in setting of uncontrolled BP.   Diabetes mellitus / Hyperglycemia  Supplemental insulin as needed  Mealtime insulin ordered  A1c at 6.5 Hold Metformin while in hospital   Polysubstance abuse  Psychiatric recommend patient to follow up with [facility].  Cocaine abuse  Substance induced mood disorder   Gout  Stable   Code Status: Full Code.  Family Communication: Care discussed with patient.  Disposition Plan: remain in patient. Continue to monitor on CIWA protocol.    Consultants:  Psych  Procedures:  none  Antibiotics:  none  HPI/Subjective: Complaining of headaches, 7/10.  Denies chest pain.  He has tremors upper extremities.   Objective: Filed Vitals:   08/10/13 1030  BP: 128/87  Pulse: 77  Temp:   Resp:     Intake/Output Summary (Last 24 hours) at 08/10/13 1233 Last data filed at 08/10/13 1143  Gross per 24 hour  Intake    840 ml  Output      0 ml  Net    840 ml   Filed Weights   08/09/13 1900  Weight: 90.7 kg (199 lb 15.3 oz)    Exam:   General:  No distress.   Cardiovascular: S 1, S 2 RRR  Respiratory: CTA  Abdomen: BS present, soft, Nt  Musculoskeletal: No edema  Data Reviewed: Basic Metabolic Panel:  Recent Labs Lab 08/07/13 0415 08/09/13 0040 08/09/13 1841 08/10/13 0615  NA 136 139  --  134*  K 3.4* 3.8  --  3.6  CL 98 102  --  96  CO2 24   --   --  25  GLUCOSE 137* 207*  --  202*  BUN 19 11  --  16  CREATININE 0.70 0.80 0.82 0.92  CALCIUM 9.2  --   --  9.7   Liver Function Tests:  Recent Labs Lab 08/07/13 0521 08/10/13 0615  AST 90* 89*  ALT 45 49  ALKPHOS 85 84  BILITOT 0.3 0.3  PROT 8.0 8.0  ALBUMIN 3.9 3.7   No results found for this basename: LIPASE, AMYLASE,  in the last 168 hours No results found for this basename: AMMONIA,  in the last 168 hours CBC:  Recent Labs Lab 08/07/13 0415 08/09/13 0040 08/09/13 1841 08/10/13 0615  WBC 6.0  --  4.6 4.8  HGB 14.2 14.3 13.7 14.3  HCT 41.9 42.0 40.7 42.2  MCV 88.4  --  87.7 88.7  PLT 127*  --  108* 125*   Cardiac Enzymes:  Recent Labs Lab 08/09/13 0336 08/09/13 1830 08/09/13 2315 08/10/13 0615  TROPONINI <0.30 <0.30 <0.30 <0.30   BNP (last 3 results) No results found for this basename: PROBNP,  in the last 8760 hours CBG:  Recent Labs Lab 08/09/13 1323 08/09/13 1816 08/09/13 2137 08/10/13 0612 08/10/13 1121  GLUCAP 239* 148* 150* 223* 177*    No results found for this or any previous visit (from  the past 240 hour(s)).   Studies: No results found.  Scheduled Meds: . amLODipine  10 mg Oral Daily  . aspirin EC  81 mg Oral Daily  . atenolol  100 mg Oral Daily  . cloNIDine  0.2 mg Oral TID  . folic acid  1 mg Oral Daily  . heparin  5,000 Units Subcutaneous Q8H  . hydrochlorothiazide  25 mg Oral Daily  . insulin aspart  0-15 Units Subcutaneous TID WC  . multivitamin with minerals  1 tablet Oral Daily  . sodium chloride  3 mL Intravenous Q12H  . thiamine  100 mg Oral Daily   Continuous Infusions:   Active Problems:   * No active hospital problems. *    Time spent: 35 minutes.     Jazma Pickel  [facility] Pager [phone]. If 7PM-7AM, please contact night-coverage at [email or web address], password [facility] 08/10/2013, 12:33 PM  LOS: 1 day

## 2013-08-10 NOTE — Progress Notes (Signed)
Denies SI, states "want to kill Elyn Peers."  Denies hearing voices at this time, states he was hearing them earlier and that he hears them sometimes when he wakes up.  VSS, NAD, will continue to monitor.

## 2013-08-11 ENCOUNTER — Encounter (HOSPITAL_COMMUNITY): Payer: Self-pay | Admitting: Family Medicine

## 2013-08-11 DIAGNOSIS — E119 Type 2 diabetes mellitus without complications: Secondary | ICD-10-CM

## 2013-08-11 DIAGNOSIS — M545 Low back pain, unspecified: Secondary | ICD-10-CM

## 2013-08-11 DIAGNOSIS — F1910 Other psychoactive substance abuse, uncomplicated: Secondary | ICD-10-CM

## 2013-08-11 DIAGNOSIS — I10 Essential (primary) hypertension: Secondary | ICD-10-CM

## 2013-08-11 DIAGNOSIS — G8929 Other chronic pain: Secondary | ICD-10-CM

## 2013-08-11 DIAGNOSIS — R52 Pain, unspecified: Secondary | ICD-10-CM

## 2013-08-11 DIAGNOSIS — S5000XA Contusion of unspecified elbow, initial encounter: Secondary | ICD-10-CM

## 2013-08-11 DIAGNOSIS — I6350 Cerebral infarction due to unspecified occlusion or stenosis of unspecified cerebral artery: Secondary | ICD-10-CM

## 2013-08-11 DIAGNOSIS — F10939 Alcohol use, unspecified with withdrawal, unspecified: Secondary | ICD-10-CM

## 2013-08-11 DIAGNOSIS — F1994 Other psychoactive substance use, unspecified with psychoactive substance-induced mood disorder: Secondary | ICD-10-CM

## 2013-08-11 DIAGNOSIS — F1010 Alcohol abuse, uncomplicated: Secondary | ICD-10-CM

## 2013-08-11 DIAGNOSIS — F1410 Cocaine abuse, uncomplicated: Secondary | ICD-10-CM

## 2013-08-11 DIAGNOSIS — F10239 Alcohol dependence with withdrawal, unspecified: Secondary | ICD-10-CM

## 2013-08-11 DIAGNOSIS — S7000XA Contusion of unspecified hip, initial encounter: Secondary | ICD-10-CM

## 2013-08-11 DIAGNOSIS — M549 Dorsalgia, unspecified: Secondary | ICD-10-CM

## 2013-08-11 DIAGNOSIS — R7309 Other abnormal glucose: Secondary | ICD-10-CM

## 2013-08-11 DIAGNOSIS — M109 Gout, unspecified: Secondary | ICD-10-CM

## 2013-08-11 LAB — GLUCOSE, CAPILLARY
Glucose-Capillary: 202 mg/dL — ABNORMAL HIGH (ref 70–99)
Glucose-Capillary: 157 mg/dL — ABNORMAL HIGH (ref 70–99)
Glucose-Capillary: 148 mg/dL — ABNORMAL HIGH (ref 70–99)

## 2013-08-11 NOTE — Progress Notes (Signed)
Chaplain responded to consult request. RN explained that patient "hears voices," "can be innapropriate," but had not expressed suicidal thoughts or plans. 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 Patient was sitting up, expressed appreciation for chaplain's visit, and invited chaplain to sit down. Patient was concerned about "pain meds." Patient spoke of speaking with his mother, who has some health problems, but not getting along with his children. Chaplain provided active listening and emotional support.  Ethelene Browns, Chaplain

## 2013-08-11 NOTE — Plan of Care (Signed)
Problem: Food- and Nutrition-Related Knowledge Deficit (NB-1.1) Goal: Nutrition education Formal process to instruct or train a patient/client in a skill or to impart knowledge to help patients/clients voluntarily manage or modify food choices and eating behavior to maintain or improve health. Outcome: Progressing  RD consulted for nutrition education regarding diabetes.     Lab Results  Component Value Date    HGBA1C 6.5* 08/09/2013    RD provided "Carbohydrate Counting for People with Diabetes" handout from the Academy of Nutrition and Dietetics. Discussed different food groups and their effects on blood sugar, emphasizing carbohydrate-containing foods. Provided list of carbohydrates and recommended serving sizes of common foods.  Discussed importance of controlled and consistent carbohydrate intake throughout the day. Provided examples of ways to balance meals/snacks and encouraged intake of high-fiber, whole grain complex carbohydrates. Teach back method used.  Expect good compliance.  Body mass index is 27.9 kg/(m^2). Pt meets criteria for overweight based on current BMI.  Current diet order is CHO Mod Medium, patient is consuming approximately 100% of meals at this time. Labs and medications reviewed. No further nutrition interventions warranted at this time. RD contact information provided. If additional nutrition issues arise, please re-consult RD.  Brynda Greathouse, MS RD LDN Clinical Inpatient Dietitian Pager: [phone] Weekend/After hours pager: [phone]

## 2013-08-11 NOTE — Progress Notes (Signed)
[facility] PROGRESS NOTE  Avanish Wiemken [id number] DOB: 1961/03/03 DOA: 08/09/2013 PCP: Default, Provider, MD  Assessment/Plan:.  1. Hypertension. Blood pressures improving, with systolic blood pressures in the 120s to 130s. Continue amlodipine 10 mg by mouth daily, atenolol 100 mg by mouth daily, clonidine 0.2 mg 3 times a day and hydrochlorothiazide. 2. Substance-related mood disorder. Patient was seen and evaluated by psychiatry during this hospitalization, with recommendations for outpatient psychiatric treatment when medically stable. I am concerned about patient's increased paranoia as well as his comment to me today, "just want to kill somebody/." Furthermore nursing staff have reported patient reporting auditory hallucinations. Will reconsult psychiatry, meanwhile continue one-to-one supervision.   3. Chronic alcohol abuse. Patient does not appear to exhibit tremors as it is unclear if present symptoms are related to alcohol withdrawal or to underlying psychiatric disorder. Will reconsult psychiatry. 4. Type 2 diabetes mellitus. Patient with hemoglobin A1c of 6.5. Continue status he'll coverage 5. Gout. Stable  Code Status: Full Code Family Communication: Plan discussed with patient Disposition Plan: Requesting reevaluation by psychiatry.   Consultants:  Psychiatry    HPI/Subjective: Patient's blood pressures improved, with systolic blood pressures mostly in the 120s 130s, most recent blood pressure 131/84. Patient appearing anxious, having multiple complaints, feeling like he "wants to kill somebody." Presently denies auditory hallucinations however nursing staff reporting to me today that patient had reported hearing voices. He denies suicidal ideations during my encounter. He has a one-to-one sitter currently.  Objective: Filed Vitals:   08/11/13 0800  BP:   Pulse: 68  Temp:   Resp:     Intake/Output Summary (Last 24 hours) at 08/11/13 1356 Last data  filed at 08/11/13 1302  Gross per 24 hour  Intake    720 ml  Output      0 ml  Net    720 ml   Filed Weights   08/09/13 1900  Weight: 90.7 kg (199 lb 15.3 oz)    Exam:   General:  Patient reporting "my whole body is breaking down" currently denies auditory hallucinations. Paranoid.  Cardiovascular: Regular rate and rhythm, normal S1 S2  Respiratory: Clear to auscultation bilaterally  Abdomen: Soft nontender nondistended  Musculoskeletal: Present range of motion to all extremities   Data Reviewed: Basic Metabolic Panel:  Recent Labs Lab 08/07/13 0415 08/09/13 0040 08/09/13 1841 08/10/13 0615  NA 136 139  --  134*  K 3.4* 3.8  --  3.6  CL 98 102  --  96  CO2 24  --   --  25  GLUCOSE 137* 207*  --  202*  BUN 19 11  --  16  CREATININE 0.70 0.80 0.82 0.92  CALCIUM 9.2  --   --  9.7   Liver Function Tests:  Recent Labs Lab 08/07/13 0521 08/10/13 0615  AST 90* 89*  ALT 45 49  ALKPHOS 85 84  BILITOT 0.3 0.3  PROT 8.0 8.0  ALBUMIN 3.9 3.7   No results found for this basename: LIPASE, AMYLASE,  in the last 168 hours No results found for this basename: AMMONIA,  in the last 168 hours CBC:  Recent Labs Lab 08/07/13 0415 08/09/13 0040 08/09/13 1841 08/10/13 0615  WBC 6.0  --  4.6 4.8  HGB 14.2 14.3 13.7 14.3  HCT 41.9 42.0 40.7 42.2  MCV 88.4  --  87.7 88.7  PLT 127*  --  108* 125*   Cardiac Enzymes:  Recent Labs Lab 08/09/13 0336 08/09/13 1830 08/09/13 2315 08/10/13 0615  TROPONINI <0.30 <0.30 <0.30 <0.30   BNP (last 3 results) No results found for this basename: PROBNP,  in the last 8760 hours CBG:  Recent Labs Lab 08/10/13 1121 08/10/13 1608 08/10/13 2116 08/11/13 0603 08/11/13 1106  GLUCAP 177* 161* 249* 202* 157*    No results found for this or any previous visit (from the past 240 hour(s)).   Studies: Ct Head Wo Contrast  08/10/2013   CLINICAL DATA:  Headaches  EXAM: CT HEAD WITHOUT CONTRAST  TECHNIQUE: Contiguous axial  images were obtained from the base of the skull through the vertex without intravenous contrast.  COMPARISON:  MRI July 09, 2012. CT July 09, 2012  FINDINGS: Calcification in the left cerebellum adjacent to the 4th ventricle is unchanged from the prior study. No other areas of abnormal calcification in the brain.  Ventricle size is normal. Negative for acute infarct. Negative for hemorrhage or mass lesion. No shift of the midline structures. No acute bony abnormality.  IMPRESSION: No acute abnormality and no change from prior studies.   Electronically Signed   By: Franchot Gallo M.D.   On: 08/10/2013 15:24    Scheduled Meds: . amLODipine  10 mg Oral Daily  . aspirin EC  81 mg Oral Daily  . atenolol  100 mg Oral Daily  . cloNIDine  0.2 mg Oral TID  . folic acid  1 mg Oral Daily  . heparin  5,000 Units Subcutaneous Q8H  . hydrochlorothiazide  25 mg Oral Daily  . insulin aspart  0-15 Units Subcutaneous TID WC  . multivitamin with minerals  1 tablet Oral Daily  . sodium chloride  3 mL Intravenous Q12H  . thiamine  100 mg Oral Daily   Continuous Infusions:   Principal Problem:   Uncontrolled hypertension Active Problems:   Diabetes mellitus   Polysubstance abuse   Cocaine abuse   Hypertension   Substance induced mood disorder   Suicidal ideations   Chronic alcohol abuse   Alcohol withdrawal   Hyperglycemia    Time spent: 35 minutes    Kelvin Cellar  [facility] Pager [phone]. If 7PM-7AM, please contact night-coverage at [email or web address], password [facility] 08/11/2013, 1:56 PM  LOS: 2 days
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
## 2013-08-12 LAB — GLUCOSE, CAPILLARY: Glucose-Capillary: 178 mg/dL — ABNORMAL HIGH (ref 70–99)

## 2013-08-12 MED ORDER — AMLODIPINE BESYLATE 10 MG PO TABS: 10.0000 mg | ORAL_TABLET | Freq: Every day | ORAL | Status: DC

## 2013-08-12 MED ORDER — ATENOLOL 100 MG PO TABS: 100.0000 mg | ORAL_TABLET | Freq: Every day | ORAL | Status: DC

## 2013-08-12 MED ORDER — LORAZEPAM 1 MG PO TABS: 1.0000 mg | ORAL_TABLET | Freq: Four times a day (QID) | ORAL | Status: DC | PRN

## 2013-08-12 MED ORDER — HYDROCHLOROTHIAZIDE 25 MG PO TABS: 25.0000 mg | ORAL_TABLET | Freq: Every day | ORAL | Status: DC

## 2013-08-12 MED ORDER — CLONIDINE HCL 0.2 MG PO TABS: 0.2000 mg | ORAL_TABLET | Freq: Three times a day (TID) | ORAL | Status: DC

## 2013-08-12 MED ORDER — RISPERIDONE 0.5 MG PO TABS: 0.5000 mg | ORAL_TABLET | Freq: Two times a day (BID) | ORAL | Status: DC
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
## 2013-08-12 NOTE — Clinical Social Work Psych Assess (Signed)
Clinical Social Work Department CLINICAL SOCIAL WORK PSYCHIATRY SERVICE LINE ASSESSMENT 08/12/2013  Patient:  [name]  Account:  [id number]  [place] Date:  08/09/2013  Clinical Social Worker:  Wylene Men  Date/Time:  08/12/2013 11:52 AM Referred by:  Physician  Date referred:  08/12/2013 Reason for Referral  Behavioral Health Issues  Substance Abuse   Presenting Symptoms/Problems (In the person's/family's own words):   Psych was consulted for SA and ETOH.   Abuse/Neglect/Trauma History (check all that apply)  Denies history   Abuse/Neglect/Trauma Comments:   none reported or noted in chart   Psychiatric History (check all that apply)  Outpatient treatment  Inpatient/hospitilization   Psychiatric medications:  cloNIDine (CATAPRES) tablet 0.2 mg  Dose: 0.2 mg Freq: 3 times daily Route: PO  Start: 08/09/13 1715   Admin Instructions:  Hold for SBP less than 110   Current Mental Health Hospitalizations/Previous Mental Health History:   [place] - prior to this admission  [facility] - outpatient   Current provider:   [facility] and Date:   on going   Current Medications:   Scheduled Meds:      . amLODipine  10 mg Oral Daily  . aspirin EC  81 mg Oral Daily  . atenolol  100 mg Oral Daily  . cloNIDine  0.2 mg Oral TID  . folic acid  1 mg Oral Daily  . heparin  5,000 Units Subcutaneous Q8H  . hydrochlorothiazide  25 mg Oral Daily  . insulin aspart  0-15 Units Subcutaneous TID WC  . multivitamin with minerals  1 tablet Oral Daily  . sodium chloride  3 mL Intravenous Q12H  . thiamine  100 mg Oral Daily        Continuous Infusions:      PRN Meds:.acetaminophen, acetaminophen, bisacodyl, hydrALAZINE, LORazepam, LORazepam, ondansetron (ZOFRAN) IV, ondansetron       Previous Impatient Admission/Date/Reason:   [facility]-prior to admission for detox (admitted from [facility])  Inpatient at [facility] back in August 2014 requesting detox.   Emotional Health /  Current Symptoms    Suicide/Self Harm  Suicide attempt in past (date/description)  None reported   Suicide attempt in the past:   History of attempts x3 - OD and hanging.  Unsure of dates. Pt not good historian.   Other harmful behavior:   none reported or noted in chart   Psychotic/Dissociative Symptoms  None reported   Other Psychotic/Dissociative Symptoms:   none reported or noted in chart    Attention/Behavioral Symptoms  Impulsive   Other Attention / Behavioral Symptoms:   none reported or noted in chart    Cognitive Impairment  Orientation - Place  Orientation - Self  Orientation - Situation  Orientation - Time  Poor Judgement  Poor/Impaired Decision-Making   Other Cognitive Impairment:   none reported or noted in chart    Mood and Adjustment  Mood Congruent    Stress, Anxiety, Trauma, Any Recent Loss/Stressor  None reported   Anxiety (frequency):   none reported or noted in the chart   Phobia (specify):   none reported or noted in the chart   Compulsive behavior (specify):   none reported or noted in chart   Obsessive behavior (specify):   none reported or noted in chart   Other:   none reported or noted in chart   Substance Abuse/Use  Substance abuse treatment needed   SBIRT completed (please refer for detailed history):  N  Self-reported substance use:  upon admission UDS positive for cocaine   Urinary Drug Screen Completed:  Y Alcohol level:   BAL 84    Environmental/Housing/Living Arrangement  With Family Member   Who is in the home:   Katharine Look, mother   Emergency contact:  Katharine Look, mother   [place]  Medicaid   Patient's Strengths and Goals (patient's own words):   Pt has reached out for help x2 for detox and SA treatment. Pt initiates conversations re: SA tx.  Pt has family support.  Pt has access to Rx and has insurance.   Clinical Social Worker's Interpretive Summary:   Psych CSW  assessed pt at bedside.  Pt had friend visit who was dismissed prior to the assessment.  Sitter remained at bedside.  Pt was alert and oriented x4.  Pt conversation and though processes were appropriate and fluid.  Pt reports SA and ETOH problems. Pt reports wishing to "quit it all".  Pt was given resources for detox, outpatient programs and residential treatment facilities to f/u with upon d/c.  Pt unsure of the level of care in which he would like to seek at this time.  Psych CSW provided education re: access to resources, wait lists and community assistance.  Psych CSW also shared with pt the 24 hr crisis hotline.  Pt denies SI/HI.  Pt denies AVHD.  Pt reports attempting suicide in the past x 3 attempts, but could not recollect the exact timeframe.  Pt reports "years ago".  Pt reports having SI once he begins to drink.  Psych CSW reviewed with pt the medical need to not drink or participate in illicit drug use.  Pt agreeable that SA and ETOH are harming his body.  Pt has plan in place to quit and support system available.  Pt realizes it is up to him to tap into the respources given.  Psych CSW requested pt to teach back.  Pt acknowledged and demonstrated understanding of the education and resources given.   Disposition:  Referred to outpatient services: [facility], [place] [phone]  Clinical Social Work

## 2013-08-12 NOTE — Progress Notes (Signed)
CSW provided patient with local bus pass and bus pass for [facility] to get to [place]. CSW signing off as CSW needs are no longer needed.  [name] Caprice, MSW, [place]

## 2013-08-13 LAB — GLUCOSE, CAPILLARY: Glucose-Capillary: 141 mg/dL — ABNORMAL HIGH (ref 70–99)

## 2013-09-21 ENCOUNTER — Emergency Department (HOSPITAL_COMMUNITY)
Admission: EM | Admit: 2013-09-21 | Discharge: 2013-09-21 | Disposition: A | Discharge status: Home / Self Care | Payer: Medicaid Other | Attending: Emergency Medicine | Admitting: Emergency Medicine

## 2013-09-21 ENCOUNTER — Encounter (HOSPITAL_COMMUNITY): Payer: Self-pay | Admitting: Emergency Medicine

## 2013-09-21 DIAGNOSIS — M129 Arthropathy, unspecified: Secondary | ICD-10-CM | POA: Insufficient documentation

## 2013-09-21 DIAGNOSIS — Z7982 Long term (current) use of aspirin: Secondary | ICD-10-CM | POA: Insufficient documentation

## 2013-09-21 DIAGNOSIS — E119 Type 2 diabetes mellitus without complications: Secondary | ICD-10-CM | POA: Insufficient documentation

## 2013-09-21 DIAGNOSIS — G8929 Other chronic pain: Secondary | ICD-10-CM

## 2013-09-21 DIAGNOSIS — Z862 Personal history of diseases of the blood and blood-forming organs and certain disorders involving the immune mechanism: Secondary | ICD-10-CM | POA: Insufficient documentation

## 2013-09-21 DIAGNOSIS — G8921 Chronic pain due to trauma: Secondary | ICD-10-CM | POA: Insufficient documentation

## 2013-09-21 DIAGNOSIS — Z79899 Other long term (current) drug therapy: Secondary | ICD-10-CM | POA: Insufficient documentation

## 2013-09-21 DIAGNOSIS — F17200 Nicotine dependence, unspecified, uncomplicated: Secondary | ICD-10-CM | POA: Insufficient documentation

## 2013-09-21 DIAGNOSIS — I10 Essential (primary) hypertension: Secondary | ICD-10-CM | POA: Insufficient documentation

## 2013-09-21 DIAGNOSIS — Z9119 Patient's noncompliance with other medical treatment and regimen: Secondary | ICD-10-CM | POA: Insufficient documentation

## 2013-09-21 DIAGNOSIS — Z8639 Personal history of other endocrine, nutritional and metabolic disease: Secondary | ICD-10-CM | POA: Insufficient documentation

## 2013-09-21 DIAGNOSIS — M25569 Pain in unspecified knee: Secondary | ICD-10-CM | POA: Insufficient documentation

## 2013-09-21 DIAGNOSIS — J45909 Unspecified asthma, uncomplicated: Secondary | ICD-10-CM | POA: Insufficient documentation

## 2013-09-21 DIAGNOSIS — Z91199 Patient's noncompliance with other medical treatment and regimen due to unspecified reason: Secondary | ICD-10-CM | POA: Insufficient documentation

## 2013-09-21 MED ORDER — METHOCARBAMOL 500 MG PO TABS: 500.0000 mg | ORAL_TABLET | Freq: Two times a day (BID) | ORAL | Status: DC | PRN

## 2013-09-21 MED ORDER — ACETAMINOPHEN 325 MG PO TABS
650.0000 mg | ORAL_TABLET | Freq: Once | ORAL | Status: AC
  Administered 2013-09-21: 650 mg via ORAL
  Filled 2013-09-21: qty 2

## 2013-09-21 MED ORDER — MELOXICAM 15 MG PO TABS: 15.0000 mg | ORAL_TABLET | Freq: Every day | ORAL | Status: DC

## 2013-09-21 NOTE — ED Provider Notes (Signed)
Medical screening examination/treatment/procedure(s) were performed by non-physician practitioner and as supervising physician I was immediately available for consultation/collaboration.  EKG Interpretation   None        Mariea Clonts, MD 09/21/13 1721

## 2013-09-21 NOTE — ED Notes (Signed)
Pt c/o bilateral knee pain for past days, history of gout and arthritis. No pain meds at home
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
## 2013-10-09 MED ORDER — GABAPENTIN 300 MG PO CAPS: 300.0000 mg | ORAL_CAPSULE | Freq: Two times a day (BID) | ORAL | Status: DC

## 2013-10-09 MED ORDER — METFORMIN HCL 500 MG PO TABS: 500.0000 mg | ORAL_TABLET | Freq: Every day | ORAL | Status: DC

## 2013-10-09 MED ORDER — ASPIRIN EC 81 MG PO TBEC: 81.0000 mg | DELAYED_RELEASE_TABLET | Freq: Every day | ORAL | Status: DC
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
## 2014-01-14 ENCOUNTER — Emergency Department (HOSPITAL_COMMUNITY)
Admission: EM | Admit: 2014-01-14 | Discharge: 2014-01-14 | Disposition: A | Discharge status: Home / Self Care | Payer: Medicaid Other

## 2014-01-14 NOTE — ED Notes (Signed)
Pt called again multiple times for triage, unable to locate.

## 2014-01-14 NOTE — ED Notes (Signed)
Unable to locate for triage, called patient multiple times.

## 2014-01-15 ENCOUNTER — Encounter (HOSPITAL_COMMUNITY): Payer: Self-pay | Admitting: Emergency Medicine

## 2014-01-15 ENCOUNTER — Emergency Department (HOSPITAL_COMMUNITY)
Admission: EM | Admit: 2014-01-15 | Discharge: 2014-01-15 | Disposition: A | Discharge status: Home / Self Care | Payer: Medicaid Other | Attending: Emergency Medicine | Admitting: Emergency Medicine

## 2014-01-15 ENCOUNTER — Emergency Department (HOSPITAL_COMMUNITY): Payer: Medicaid Other

## 2014-01-15 DIAGNOSIS — Z8739 Personal history of other diseases of the musculoskeletal system and connective tissue: Secondary | ICD-10-CM | POA: Insufficient documentation

## 2014-01-15 DIAGNOSIS — Z9119 Patient's noncompliance with other medical treatment and regimen: Secondary | ICD-10-CM | POA: Insufficient documentation

## 2014-01-15 DIAGNOSIS — Z91148 Patient's other noncompliance with medication regimen for other reason: Secondary | ICD-10-CM

## 2014-01-15 DIAGNOSIS — R030 Elevated blood-pressure reading, without diagnosis of hypertension: Secondary | ICD-10-CM

## 2014-01-15 DIAGNOSIS — Z9114 Patient's other noncompliance with medication regimen: Secondary | ICD-10-CM

## 2014-01-15 DIAGNOSIS — R739 Hyperglycemia, unspecified: Secondary | ICD-10-CM

## 2014-01-15 DIAGNOSIS — F17200 Nicotine dependence, unspecified, uncomplicated: Secondary | ICD-10-CM | POA: Insufficient documentation

## 2014-01-15 DIAGNOSIS — E119 Type 2 diabetes mellitus without complications: Secondary | ICD-10-CM | POA: Insufficient documentation

## 2014-01-15 DIAGNOSIS — R0602 Shortness of breath: Secondary | ICD-10-CM

## 2014-01-15 DIAGNOSIS — Z91199 Patient's noncompliance with other medical treatment and regimen due to unspecified reason: Secondary | ICD-10-CM | POA: Insufficient documentation

## 2014-01-15 DIAGNOSIS — R000 Tachycardia, unspecified: Secondary | ICD-10-CM | POA: Insufficient documentation

## 2014-01-15 DIAGNOSIS — Z79899 Other long term (current) drug therapy: Secondary | ICD-10-CM | POA: Insufficient documentation

## 2014-01-15 DIAGNOSIS — F909 Attention-deficit hyperactivity disorder, unspecified type: Secondary | ICD-10-CM | POA: Insufficient documentation

## 2014-01-15 DIAGNOSIS — F411 Generalized anxiety disorder: Secondary | ICD-10-CM | POA: Insufficient documentation

## 2014-01-15 DIAGNOSIS — IMO0001 Reserved for inherently not codable concepts without codable children: Secondary | ICD-10-CM

## 2014-01-15 DIAGNOSIS — I10 Essential (primary) hypertension: Secondary | ICD-10-CM | POA: Insufficient documentation

## 2014-01-15 LAB — CBC WITH DIFFERENTIAL/PLATELET
BASOS PCT: 0 % (ref 0–1)
Basophils Absolute: 0 10*3/uL (ref 0.0–0.1)
EOS ABS: 0.1 10*3/uL (ref 0.0–0.7)
EOS PCT: 3 % (ref 0–5)
HCT: 38.4 % — ABNORMAL LOW (ref 39.0–52.0)
Hemoglobin: 13.2 g/dL (ref 13.0–17.0)
Lymphocytes Relative: 34 % (ref 12–46)
Lymphs Abs: 1.7 10*3/uL (ref 0.7–4.0)
MCH: 30.1 pg (ref 26.0–34.0)
MCHC: 34.4 g/dL (ref 30.0–36.0)
MCV: 87.7 fL (ref 78.0–100.0)
Monocytes Absolute: 0.4 10*3/uL (ref 0.1–1.0)
Monocytes Relative: 9 % (ref 3–12)
NEUTROS PCT: 55 % (ref 43–77)
Neutro Abs: 2.7 10*3/uL (ref 1.7–7.7)
PLATELETS: 108 10*3/uL — AB (ref 150–400)
RBC: 4.38 MIL/uL (ref 4.22–5.81)
RDW: 13.6 % (ref 11.5–15.5)
WBC: 4.9 10*3/uL (ref 4.0–10.5)

## 2014-01-15 LAB — BASIC METABOLIC PANEL
BUN: 14 mg/dL (ref 6–23)
CALCIUM: 9.1 mg/dL (ref 8.4–10.5)
CO2: 21 mEq/L (ref 19–32)
Chloride: 97 mEq/L (ref 96–112)
Creatinine, Ser: 0.78 mg/dL (ref 0.50–1.35)
GFR calc non Af Amer: >90 mL/min (ref >90)
Glucose, Bld: 425 mg/dL — ABNORMAL HIGH (ref 70–99)
Potassium: 5.1 mEq/L (ref 3.7–5.3)
SODIUM: 134 meq/L — AB (ref 137–147)

## 2014-01-15 LAB — PRO B NATRIURETIC PEPTIDE: PRO B NATRI PEPTIDE: 215.7 pg/mL — AB (ref 0–125)

## 2014-01-15 LAB — CBG MONITORING, ED: GLUCOSE-CAPILLARY: 287 mg/dL — AB (ref 70–99)

## 2014-01-15 LAB — TROPONIN I: Troponin I: <0.3 ng/mL (ref <0.30)

## 2014-01-15 MED ORDER — CLONIDINE HCL 0.2 MG PO TABS
0.2000 mg | ORAL_TABLET | Freq: Three times a day (TID) | ORAL | Status: DC
Start: 2014-01-15 — End: 2014-01-15
  Administered 2014-01-15: 0.2 mg via ORAL
  Filled 2014-01-15: qty 1

## 2014-01-15 MED ORDER — CLONIDINE HCL 0.2 MG PO TABS: 0.2000 mg | ORAL_TABLET | Freq: Every day | ORAL | Status: DC

## 2014-01-15 MED ORDER — HYDROCHLOROTHIAZIDE 25 MG PO TABS
25.0000 mg | ORAL_TABLET | Freq: Once | ORAL | Status: AC
  Administered 2014-01-15: 25 mg via ORAL
  Filled 2014-01-15: qty 1

## 2014-01-15 MED ORDER — AMLODIPINE BESYLATE 10 MG PO TABS: 10.0000 mg | ORAL_TABLET | Freq: Every day | ORAL | Status: DC

## 2014-01-15 MED ORDER — ATENOLOL 25 MG PO TABS
100.0000 mg | ORAL_TABLET | Freq: Once | ORAL | Status: AC
  Administered 2014-01-15: 100 mg via ORAL
  Filled 2014-01-15: qty 4

## 2014-01-15 MED ORDER — SODIUM CHLORIDE 0.9 % IV BOLUS (SEPSIS): 1000.0000 mL | Freq: Once | INTRAVENOUS | Status: DC

## 2014-01-15 MED ORDER — SODIUM CHLORIDE 0.9 % IV BOLUS (SEPSIS)
1000.0000 mL | Freq: Once | INTRAVENOUS | Status: AC
  Administered 2014-01-15: 1000 mL via INTRAVENOUS

## 2014-01-15 MED ORDER — ATENOLOL 100 MG PO TABS: 100.0000 mg | ORAL_TABLET | Freq: Every day | ORAL | Status: DC

## 2014-01-15 MED ORDER — METFORMIN HCL 500 MG PO TABS: 500.0000 mg | ORAL_TABLET | Freq: Every day | ORAL | Status: DC

## 2014-01-15 MED ORDER — AMLODIPINE BESYLATE 10 MG PO TABS
10.0000 mg | ORAL_TABLET | Freq: Once | ORAL | Status: AC
  Administered 2014-01-15: 10 mg via ORAL
  Filled 2014-01-15: qty 1

## 2014-01-15 NOTE — ED Notes (Signed)
PT states" I take my blood pressure meds .like I think they should be taken."

## 2014-01-15 NOTE — ED Notes (Signed)
Pt also states SOB. Respirations unlabored at the time. Speaking complete sentences. Pt is alert and oriented x4. No signs of distress noted at present.

## 2014-01-15 NOTE — ED Notes (Addendum)
Pt states he received Invega injection 01/07/2014 at [facility]. Now states he feels "funny." states he feels very tired. Also states he feels like jaw is swollen. Pt is alert and oriented x4. Able to move all extremities. No neurological deficits noted.

## 2014-01-15 NOTE — ED Provider Notes (Signed)
CSN: [id number]     Arrival date & time 01/15/14  0636 History   First MD Initiated Contact with Patient 01/15/14 [phone]     Chief Complaint  Patient presents with  . Allergic Reaction     (Consider location/radiation/quality/duration/timing/severity/associated sxs/prior Treatment) HPI 53 yo male with hx of bipolar and schizophrenia. Patient states he received an injection of "invega" on 01/07/14. Patient states it is the first time he received this injection.  Patient states since then he has started feeling more tired lately. Admits to SOB x 5 days ago, though denies any current SOB. Denies chest pain, fever/chills, N/V, diaphoresis, dizziness, or visual changes. Admits to 3 beers per day. Denies any current drug use. Admits to 1 cigarette per day. PMH significant for DM, HTN, Polysubstance abuse, Arthritis, Gout. Patient is non compliant with medications. Patient states he only takes BP medication when he feels bad. Patient denies taking any medications this morning.  Past Medical History  Diagnosis Date  . Drug abuse   . Alcohol abuse   . Diabetes mellitus   . Uncontrolled hypertension   . Arthritis   . Gout   . DTs (delirium tremens)     history of   . Crack cocaine use   . Noncompliance with medication regimen    Past Surgical History  Procedure Laterality Date  . Mandible reconstruction     Family History  Problem Relation Age of Onset  . Hypertension     History  Substance Use Topics  . Smoking status: Current Every Day Smoker -- 0.25 packs/day    Types: Cigarettes  . Smokeless tobacco: Never Used  . Alcohol Use: 1.8 oz/week    3 Cans of beer per week    Review of Systems  All other systems reviewed and are negative.      Allergies  Invega  Home Medications   Current Outpatient Rx  Name  Route  Sig  Dispense  Refill  . amLODipine (NORVASC) 10 MG tablet   Oral   Take 1 tablet (10 mg total) by mouth daily.   30 tablet   0   . atenolol (TENORMIN) 100 MG  tablet   Oral   Take 1 tablet (100 mg total) by mouth daily.   30 tablet   1   . cloNIDine (CATAPRES) 0.2 MG tablet   Oral   Take 1 tablet (0.2 mg total) by mouth daily.   30 tablet   0   . metFORMIN (GLUCOPHAGE) 500 MG tablet   Oral   Take 1 tablet (500 mg total) by mouth daily. For diabetes management   30 tablet   0    BP 175/116  Pulse 72  Temp(Src) 98.1 F (36.7 C) (Oral)  Resp 24  Wt 217 lb (98.431 kg)  SpO2 96% Physical Exam  Nursing note and vitals reviewed. Constitutional: He is oriented to person, place, and time. He appears well-developed and well-nourished. No distress.  HENT:  Head: Normocephalic and atraumatic.  Eyes: Conjunctivae are normal.  Neck: Normal range of motion. Neck supple. No JVD present. No tracheal deviation present.  Cardiovascular: Regular rhythm.  Tachycardia present.  Exam reveals no gallop and no friction rub.   No murmur heard. Pulmonary/Chest: Effort normal and breath sounds normal. No respiratory distress. He has no wheezes. He has no rhonchi. He has no rales.  Musculoskeletal: Normal range of motion. He exhibits no edema.  Neurological: He is alert and oriented to person, place, and time.  Skin: Skin  is warm and dry. No rash noted. He is not diaphoretic.  Psychiatric: Thought content normal. His mood appears anxious. His speech is rapid and/or pressured. He is hyperactive. Cognition and memory are normal.    ED Course  Procedures (including critical care time) Labs Review Labs Reviewed  CBC WITH DIFFERENTIAL - Abnormal; Notable for the following:    HCT 38.4 (*)    Platelets 108 (*)    All other components within normal limits  BASIC METABOLIC PANEL - Abnormal; Notable for the following:    Sodium 134 (*)    Glucose, Bld 425 (*)    All other components within normal limits  PRO B NATRIURETIC PEPTIDE - Abnormal; Notable for the following:    Pro B Natriuretic peptide (BNP) 215.7 (*)    All other components within normal  limits  CBG MONITORING, ED - Abnormal; Notable for the following:    Glucose-Capillary 287 (*)    All other components within normal limits  TROPONIN I   Imaging Review Dg Chest 2 View  01/15/2014   CLINICAL DATA:  History of uncontrolled hypertension, alcohol abuse, drug abuse, shortness of breath for 5 days  EXAM: CHEST  2 VIEW  COMPARISON:  DG CHEST 2V dated 11/27/2013  FINDINGS: The heart size and mediastinal contours are within normal limits. Both lungs are clear. The visualized skeletal structures are unremarkable.  IMPRESSION: No active cardiopulmonary disease.   Electronically Signed   By: Skipper Cliche M.D.   On: 01/15/2014 09:44     EKG Interpretation None      MDM   Final diagnoses:  Elevated BP  Hyperglycemia  Noncompliance with medications  Shortness of breath    Patient hypertensive on admission. Suspect related to patient noncompliance with BP meds. Patient take Clonidine, possible rebound effect. BP trending down with tx in ED.  Hyperglycemia at 425. Patient asymptomatic. Improved to 287 with IV fluids Troponin negative BNP elevated to 215.7 improved from previous. No signs of heart failure on exam or CXR CXR negative  Discussed labs, and exam findings with patient. Advised follow up with PCP. Patient provided resource guide for reference. Recommend return to ED should symptoms worsen. Patient agrees with plan. Discharged in good condition.    Meds given in ED:  Medications  cloNIDine (CATAPRES) tablet 0.2 mg (0.2 mg Oral Given 01/15/14 0843)  amLODipine (NORVASC) tablet 10 mg (10 mg Oral Given 01/15/14 0847)  atenolol (TENORMIN) tablet 100 mg (100 mg Oral Given 01/15/14 0842)  hydrochlorothiazide (HYDRODIURIL) tablet 25 mg (25 mg Oral Given 01/15/14 0843)  sodium chloride 0.9 % bolus 1,000 mL (1,000 mLs Intravenous New Bag/Given 01/15/14 1035)    New Prescriptions   No medications on file   Refilled Patient's current medications        Sherrie George, PA-C 01/15/14 1205

## 2014-01-15 NOTE — ED Notes (Signed)
He received an injection 5 days ago at [facility] for anxiety and depression.  Since he received that injection he has been walking and taking slower..  Weakness. His speech is different sob .  Hip pain.  Jaws swollen and numb

## 2014-01-15 NOTE — Discharge Instructions (Signed)
Make follow up appointment with [facility] and wellness. Take your medications as prescribed. Return to emergency department if you develop worsening shortness of breath, chest pain, dizziness, or fever/chills. Resource guide provided below for further follow up.    Emergency Department Resource Guide 1) Find a Doctor and Pay Out of Pocket Although you won't have to find out who is covered by your insurance plan, it is a good idea to ask around and get recommendations. You will then need to call the office and see if the doctor you have chosen will accept you as a new patient and what types of options they offer for patients who are self-pay. Some doctors offer discounts or will set up payment plans for their patients who do not have insurance, but you will need to ask so you aren't surprised when you get to your appointment.  2) Contact Your Local Health Department Not all health departments have doctors that can see patients for sick visits, but many do, so it is worth a call to see if yours does. If you don't know where your local health department is, you can check in your phone book. The CDC also has a tool to help you locate your state's health department, and many state websites also have listings of all of their local health departments.  3) Find a [facility] If your illness is not likely to be very severe or complicated, you may want to try a walk in clinic. These are popping up all over the country in pharmacies, drugstores, and shopping centers. They're usually staffed by nurse practitioners or physician assistants that have been trained to treat common illnesses and complaints. They're usually fairly quick and inexpensive. However, if you have serious medical issues or chronic medical problems, these are probably not your best option.  No Primary Care Doctor: - Call Health Connect at  [phone] - they can help you locate a primary care doctor that  accepts your  insurance, provides certain services, etc. - Physician Referral Service- [phone]  Chronic Pain Problems: Organization         Address  Phone   Notes  [facility]  [phone] Patients need to be referred by their primary care doctor.   Medication Assistance: Organization         Address  Phone   Notes  [facility] Medication [facility] [address] [place], [place] [address] [phone] --Must be a resident of [facility] -- Must have NO insurance coverage whatsoever (no Medicaid/ Medicare, etc.) -- The pt. MUST have a primary care doctor that directs their care regularly and follows them in the community   MedAssist  [phone]   [employer]  [phone]    Agencies that provide inexpensive medical care: Organization         Address  Phone   Notes  [place]  [phone]   [facility]    [phone]   [facility] [place], [place] [address] [phone]   [place] [address], [place] [phone]   Planned Parenthood    [phone]   [facility]    [phone]   [place] and [address], [place] Phone:  [phone], Fax:  [phone] Hours of Operation:  9 am - 6 pm, M-F.  Also accepts Medicaid/Medicare  and self-pay.  [facility] for [place] [address], [address], [place] Phone: [phone], Fax: [phone]. Hours of Operation:  8:30 am - 5:30 pm, M-F.  Also accepts Medicaid and self-pay.  [facility] [place] [address] [place] Phone: [phone]   [place], [place], [place] [phone], Ext. [phone] Mondays & Thursdays: 7-9 AM.  First 15 patients are seen on a first come, first serve basis.    [place] Providers:  Organization          Address  Phone   Notes  [facility] [address], [place] [phone] Also accepts self-pay patients.  [facility] [id number] [place], [place]  [phone]   [place], [address], [place] [phone]   [facility] [address], [place] [phone]   Lucianne Lei [address], [place]   [phone] Only accepts [place] Access [place] patients after they have their name applied to their card.   Self-Pay (no insurance) in [facility]:  Organization         Address  Phone   Notes  Sickle Cell Patients, [facility] Internal [facility] [place] [phone]   [facility] [place] [phone]   [facility] [place]  [place], [place], [place] [phone]   [facility]/Dr. Osei-Bonsu  [address] [place] or [address], [place] [phone] Phone number for both [place] and [place] locations is the same.  [facility] [address], [place] [phone]   [facility] [place] [address], [place] or [address] [phone]   [facility] [address] [place] [phone], phone; [phone], fax Sees patients 1st and 3rd Saturday of every month.  Must not qualify for public or private insurance (i.e. Medicaid, Medicare, [place] Health Choice, Veterans' Benefits)  Household income should be no more than 200% of the poverty level The clinic cannot treat you if you are pregnant or think you are pregnant  Sexually transmitted diseases are not treated at the clinic.    Dental Care: Organization         Address  Phone  Notes  [facility] of [facility] [place] [phone] Accepts children up to age 77 who are enrolled in [place] or [place]; pregnant women with a Medicaid card; and children who have applied for Medicaid or [place] Health Choice, but were declined, whose parents can pay a reduced fee at time of service.  [facility] Department of [facility]  [address], [place] [phone] Accepts children up to age 9 who are enrolled in [place] or [place]; pregnant women with a Medicaid card; and children who have applied for Medicaid or  Health Choice, but were declined, whose parents can pay a reduced fee at time of service.  [place] Adult Dental Access PROGRAM  [facility] [phone] Patients are seen by appointment only. Walk-ins are not accepted. Ronald will see patients 18 years of age and older. Monday - Tuesday (8am-5pm) Most Wednesdays (  8:30-5pm) $30 per visit, cash only  [facility] Adult Dental Access PROGRAM  [address], [facility] [phone] Patients are seen by appointment only. Walk-ins are not accepted. [place] will see patients 69 years of age and older. One Wednesday Evening (Monthly: Volunteer Based).  $30 per visit, cash only  [place]  [phone] for adults; Children under age 58, call Graduate Pediatric Dentistry at [phone]. Children aged 77-14, please call [phone] to request a pediatric application.  Dental services are provided in all areas of dental care including fillings, crowns and bridges, complete and partial dentures, implants, gum treatment, root canals, and extractions. Preventive care is also provided. Treatment is provided to both adults and children. Patients are selected via a lottery and there is often a waiting list.   [facility] [address], [place]  [phone] [email or web address]   [facility] [address] [place], [place]  [phone], Ext. [phone] Second and Fourth Thursday of each month, opens at 6:30 AM; Clinic ends at 9 AM.  Patients are seen on a first-come first-served basis, and a limited number are seen during each clinic.   [facility]  [address] Hillard Danker [place], [place] [phone]   Eligibility Requirements You must have lived in [place], [place], or [place] counties for at least the last three months.   You cannot be eligible for state or federal sponsored [employer], including [employer], [place], or [employer].   You generally cannot be eligible for healthcare insurance through your employer.    How to apply: Eligibility screenings are held every Tuesday and Wednesday afternoon from 1:00 pm until 4:00 pm. You do not need an appointment for the interview!  [facility] [address], [place], [place]   [place]  [place] Department  [place]  [phone]    Behavioral Health Resources in the Community: Intensive Outpatient Programs Organization         Address  Phone  Notes  Avery Mitchell. [address] [place], [place] [phone]   [facility] Outpatient [address], [place], [place]   ADS: Alcohol & Drug Svcs [address] [place], [place]   [place] [address]  [place], [place] or [phone]   Substance Abuse Resources Organization         Address  Phone  Notes  Alcohol and Drug Services  [phone]   [place]  [phone]   [facility] [place]   Chinita Pester  [phone]   Residential & Outpatient Substance Abuse Program  [phone]   Psychological Services Organization         Address  Phone  Notes  [facility]  [place]  [phone]    [place] [address] [place] or [phone]    Mobile Crisis Teams Organization         Address  Phone  Notes  [facility], Mobile Crisis Care Unit  [phone]   [facility]  [address] [place], [place]   [place] [address], [name] [phone]    Self-Help/Support Groups Organization         Address  Phone  Notes  Mental Health Assoc[facility] of [place] - variety of support groups  [place] Call for more information  Narcotics Anonymous (NA), [facility] [address], [employer] [place]  2 meetings at this location   Special educational needs teacher         Address  Phone  Notes  [facility]idential [facility] [place],    [place]  [phone]   [facility]  [address], [place] [phone], [place], [place]   [place], [place] [phone] Admissions: 8am-3pm M-F  Incentives Substance [place] [address],    [place], [place] [phone]   [facility] [address] [place], [place], [place]   [facility] [address]  [place], [place]   Insight Programs - Intensive Outpatient [address], [place], [place]   [facility] ([place] [place].,  [place], [place] [phone] or [phone]   [facility] Services (RTS) [address] [place], [place] Accepts Medicaid  [facility] [address],  [place] [phone] Substance Abuse/Addiction Treatment   [facility] Organization         Address  Phone  Notes  [facility]  [phone]   Domenic Schwab, PhD [address] Arlis Porta [place], [place]   [phone] or [phone]   [place]  [place], [place] [phone]   [facility] [address], [place], [place] [phone] Insurance/Medicaid/sponsorship through [facility] and Families [address], [address] [place]                                    [place], [place] [phone] [place] [address], [place] [phone]    Dr. Adele Schilder  [phone]   [facility] Dept. 1) [address] [place] 2) [place] 3)  [place] [address], [place] [phone]  [phone]   [place] [phone] or [phone] (After Hours)

## 2014-01-16 NOTE — ED Provider Notes (Signed)
Medical screening examination/treatment/procedure(s) were conducted as a shared visit with non-physician practitioner(s) and myself.  I personally evaluated the patient during the encounter.   EKG Interpretation   Date/Time:  Saturday January 15 2014 08:28:40 EST Ventricular Rate:  95 PR Interval:  176 QRS Duration: 99 QT Interval:  364 QTC Calculation: 458 R Axis:   13 Text Interpretation:  Sinus rhythm LAE, consider biatrial enlargement  Abnormal R-wave progression, late transition Left ventricular hypertrophy  Borderline T abnormalities, inferior leads ST elev, probable normal early  repol pattern No significant change was found Confirmed by Cindra Austad  MD,  Eran Windish ([id number]) on 01/16/2014 7:26:48 AM      Patient here with some shortness of breath.  Medical noncompliance.  History of cocaine abuse.  Blood pressure is elevated on arrival of emergency department.  He will be given his home meds.  Labs and chest x-ray obtained.  EKG without significant changes.  Blood pressure improved after medication emergency department.  Diabetic without DKA.  Hyperglycemia.  Started back on his medicines.  Outpatient PCP followup.  Dg Chest 2 View  01/15/2014   CLINICAL DATA:  History of uncontrolled hypertension, alcohol abuse, drug abuse, shortness of breath for 5 days  EXAM: CHEST  2 VIEW  COMPARISON:  DG CHEST 2V dated 11/27/2013  FINDINGS: The heart size and mediastinal contours are within normal limits. Both lungs are clear. The visualized skeletal structures are unremarkable.  IMPRESSION: No active cardiopulmonary disease.   Electronically Signed   By: Skipper Cliche M.D.   On: 01/15/2014 09:44  I personally reviewed the imaging tests through PACS system I reviewed available ER/hospitalization records through the [place], MD 01/16/14 [phone]

## 2014-01-20 ENCOUNTER — Encounter (HOSPITAL_COMMUNITY): Payer: Self-pay | Admitting: Emergency Medicine

## 2014-01-20 ENCOUNTER — Emergency Department (INDEPENDENT_AMBULATORY_CARE_PROVIDER_SITE_OTHER)
Admission: EM | Admit: 2014-01-20 | Discharge: 2014-01-20 | Disposition: A | Discharge status: Home / Self Care | Payer: Medicaid Other | Source: Home / Self Care | Attending: Emergency Medicine | Admitting: Emergency Medicine

## 2014-01-20 DIAGNOSIS — S7001XA Contusion of right hip, initial encounter: Secondary | ICD-10-CM

## 2014-01-20 DIAGNOSIS — S5001XA Contusion of right elbow, initial encounter: Secondary | ICD-10-CM

## 2014-01-20 DIAGNOSIS — S5000XA Contusion of unspecified elbow, initial encounter: Secondary | ICD-10-CM

## 2014-01-20 DIAGNOSIS — S7000XA Contusion of unspecified hip, initial encounter: Secondary | ICD-10-CM

## 2014-01-20 MED ORDER — NAPROXEN 500 MG PO TABS: 500.0000 mg | ORAL_TABLET | Freq: Two times a day (BID) | ORAL | Status: DC

## 2014-01-20 NOTE — ED Provider Notes (Signed)
Medical screening examination/treatment/procedure(s) were performed by non-physician practitioner and as supervising physician I was immediately available for consultation/collaboration.  Philipp Deputy, M.D.  [place] Mo, MD 01/20/14 [phone]

## 2014-01-20 NOTE — Discharge Instructions (Signed)
Elbow Contusion °An elbow contusion is a deep bruise of the elbow. Contusions are the result of an injury that caused bleeding under the skin. The contusion may turn blue, purple, or yellow. Minor injuries will give you a painless contusion, but more severe contusions may stay painful and swollen for a few weeks.  °CAUSES  °An elbow contusion comes from a direct force to that area, such as falling on the elbow. °SYMPTOMS  °· Swelling and redness of the elbow. °· Bruising of the elbow area. °· Tenderness or soreness of the elbow. °DIAGNOSIS  °You will have a physical exam and will be asked about your history. You may need an X-ray of your elbow to look for a broken bone (fracture).  °TREATMENT  °A sling or splint may be needed to support your injury. Resting, elevating, and applying cold compresses to the elbow area are often the best treatments for an elbow contusion. Over-the-counter medicines may also be recommended for pain control. °HOME CARE INSTRUCTIONS  °· Put ice on the injured area. °· Put ice in a plastic bag. °· Place a towel between your skin and the bag. °· Leave the ice on for 15-20 minutes, 03-04 times a day. °· Only take over-the-counter or prescription medicines for pain, discomfort, or fever as directed by your caregiver. °· Rest your injured elbow until the pain and swelling are better. °· Elevate your elbow to reduce swelling. °· Apply a compression wrap as directed by your caregiver. This can help reduce swelling and motion. You may remove the wrap for sleeping, showers, and baths. If your fingers become numb, cold, or blue, take the wrap off and reapply it more loosely. °· Use your elbow only as directed by your caregiver. You may be asked to do range of motion exercises. Do them as directed. °· See your caregiver as directed. It is very important to keep all follow-up appointments in order to avoid any long-term problems with your elbow, including chronic pain or inability to move your elbow  normally. °SEEK IMMEDIATE MEDICAL CARE IF:  °· You have increased redness, swelling, or pain in your elbow. °· Your swelling or pain is not relieved with medicines. °· You have swelling of the hand and fingers. °· You are unable to move your fingers or wrist. °· You begin to lose feeling in your hand or fingers. °· Your fingers or hand become cold or blue. °MAKE SURE YOU:  °· Understand these instructions. °· Will watch your condition. °· Will get help right away if you are not doing well or get worse. °Document Released: 10/13/2006 Document Revised: 01/27/2012 Document Reviewed: 09/20/2011 °ExitCare® Patient Information ©2014 [employer]. ° °
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
## 2014-01-29 ENCOUNTER — Emergency Department (HOSPITAL_COMMUNITY)
Admission: EM | Admit: 2014-01-29 | Discharge: 2014-01-30 | Disposition: A | Discharge status: Home / Self Care | Payer: Medicaid Other | Attending: Emergency Medicine | Admitting: Emergency Medicine

## 2014-01-29 ENCOUNTER — Encounter (HOSPITAL_COMMUNITY): Payer: Self-pay | Admitting: Emergency Medicine

## 2014-01-29 DIAGNOSIS — Z9119 Patient's noncompliance with other medical treatment and regimen: Secondary | ICD-10-CM | POA: Insufficient documentation

## 2014-01-29 DIAGNOSIS — Z79899 Other long term (current) drug therapy: Secondary | ICD-10-CM | POA: Insufficient documentation

## 2014-01-29 DIAGNOSIS — F17200 Nicotine dependence, unspecified, uncomplicated: Secondary | ICD-10-CM | POA: Insufficient documentation

## 2014-01-29 DIAGNOSIS — F29 Unspecified psychosis not due to a substance or known physiological condition: Secondary | ICD-10-CM | POA: Insufficient documentation

## 2014-01-29 DIAGNOSIS — F10939 Alcohol use, unspecified with withdrawal, unspecified: Secondary | ICD-10-CM

## 2014-01-29 DIAGNOSIS — Z791 Long term (current) use of non-steroidal anti-inflammatories (NSAID): Secondary | ICD-10-CM | POA: Insufficient documentation

## 2014-01-29 DIAGNOSIS — I10 Essential (primary) hypertension: Secondary | ICD-10-CM | POA: Insufficient documentation

## 2014-01-29 DIAGNOSIS — R739 Hyperglycemia, unspecified: Secondary | ICD-10-CM

## 2014-01-29 DIAGNOSIS — F10239 Alcohol dependence with withdrawal, unspecified: Secondary | ICD-10-CM

## 2014-01-29 DIAGNOSIS — Z8669 Personal history of other diseases of the nervous system and sense organs: Secondary | ICD-10-CM | POA: Insufficient documentation

## 2014-01-29 DIAGNOSIS — F1910 Other psychoactive substance abuse, uncomplicated: Secondary | ICD-10-CM | POA: Insufficient documentation

## 2014-01-29 DIAGNOSIS — Z91199 Patient's noncompliance with other medical treatment and regimen due to unspecified reason: Secondary | ICD-10-CM | POA: Insufficient documentation

## 2014-01-29 DIAGNOSIS — E119 Type 2 diabetes mellitus without complications: Secondary | ICD-10-CM | POA: Insufficient documentation

## 2014-01-29 DIAGNOSIS — F1410 Cocaine abuse, uncomplicated: Secondary | ICD-10-CM | POA: Diagnosis present

## 2014-01-29 DIAGNOSIS — F1994 Other psychoactive substance use, unspecified with psychoactive substance-induced mood disorder: Secondary | ICD-10-CM | POA: Diagnosis present

## 2014-01-29 DIAGNOSIS — I639 Cerebral infarction, unspecified: Secondary | ICD-10-CM

## 2014-01-29 DIAGNOSIS — F1010 Alcohol abuse, uncomplicated: Secondary | ICD-10-CM | POA: Diagnosis present

## 2014-01-29 DIAGNOSIS — M109 Gout, unspecified: Secondary | ICD-10-CM | POA: Insufficient documentation

## 2014-01-29 LAB — COMPREHENSIVE METABOLIC PANEL
ALBUMIN: 4.4 g/dL (ref 3.5–5.2)
ALK PHOS: 77 U/L (ref 39–117)
ALT: 22 U/L (ref 0–53)
AST: 51 U/L — AB (ref 0–37)
BILIRUBIN TOTAL: 0.4 mg/dL (ref 0.3–1.2)
BUN: 13 mg/dL (ref 6–23)
CO2: 25 mEq/L (ref 19–32)
CREATININE: 0.84 mg/dL (ref 0.50–1.35)
Calcium: 9.9 mg/dL (ref 8.4–10.5)
Chloride: 100 mEq/L (ref 96–112)
GFR calc Af Amer: >90 mL/min (ref >90)
GFR calc non Af Amer: >90 mL/min (ref >90)
Glucose, Bld: 187 mg/dL — ABNORMAL HIGH (ref 70–99)
POTASSIUM: 4.1 meq/L (ref 3.7–5.3)
Sodium: 140 mEq/L (ref 137–147)
Total Protein: 9.1 g/dL — ABNORMAL HIGH (ref 6.0–8.3)

## 2014-01-29 LAB — CBG MONITORING, ED
GLUCOSE-CAPILLARY: 172 mg/dL — AB (ref 70–99)
GLUCOSE-CAPILLARY: 115 mg/dL — AB (ref 70–99)
Glucose-Capillary: 184 mg/dL — ABNORMAL HIGH (ref 70–99)
Glucose-Capillary: 152 mg/dL — ABNORMAL HIGH (ref 70–99)
Glucose-Capillary: 190 mg/dL — ABNORMAL HIGH (ref 70–99)

## 2014-01-29 LAB — RAPID URINE DRUG SCREEN, HOSP PERFORMED
AMPHETAMINES: NOT DETECTED
BENZODIAZEPINES: NOT DETECTED
Barbiturates: NOT DETECTED
Cocaine: POSITIVE — AB
OPIATES: NOT DETECTED
TETRAHYDROCANNABINOL: NOT DETECTED

## 2014-01-29 LAB — CBC
HEMATOCRIT: 40.7 % (ref 39.0–52.0)
Hemoglobin: 13.6 g/dL (ref 13.0–17.0)
MCH: 29.4 pg (ref 26.0–34.0)
MCHC: 33.4 g/dL (ref 30.0–36.0)
MCV: 88.1 fL (ref 78.0–100.0)
Platelets: 186 10*3/uL (ref 150–400)
RBC: 4.62 MIL/uL (ref 4.22–5.81)
RDW: 12.8 % (ref 11.5–15.5)
WBC: 7.6 10*3/uL (ref 4.0–10.5)

## 2014-01-29 LAB — HEMOGLOBIN A1C
HEMOGLOBIN A1C: 8 % — AB (ref <5.7)
Mean Plasma Glucose: 183 mg/dL — ABNORMAL HIGH (ref <117)

## 2014-01-29 LAB — ETHANOL: Alcohol, Ethyl (B): 114 mg/dL — ABNORMAL HIGH (ref 0–11)

## 2014-01-29 MED ORDER — IBUPROFEN 200 MG PO TABS
600.0000 mg | ORAL_TABLET | Freq: Three times a day (TID) | ORAL | Status: DC | PRN
  Administered 2014-01-29: 600 mg via ORAL
  Filled 2014-01-29: qty 3

## 2014-01-29 MED ORDER — LORAZEPAM 1 MG PO TABS: 0.0000 mg | ORAL_TABLET | Freq: Two times a day (BID) | ORAL | Status: DC

## 2014-01-29 MED ORDER — INSULIN ASPART 100 UNIT/ML ~~LOC~~ SOLN
0.0000 [IU] | Freq: Three times a day (TID) | SUBCUTANEOUS | Status: DC
  Administered 2014-01-29 (×3): 3 [IU] via SUBCUTANEOUS
  Administered 2014-01-30: 2 [IU] via SUBCUTANEOUS
  Administered 2014-01-30: 5 [IU] via SUBCUTANEOUS
  Filled 2014-01-29 (×5): qty 1

## 2014-01-29 MED ORDER — NAPROXEN 500 MG PO TABS
500.0000 mg | ORAL_TABLET | Freq: Two times a day (BID) | ORAL | Status: DC
  Administered 2014-01-29 – 2014-01-30 (×2): 500 mg via ORAL
  Filled 2014-01-29 (×2): qty 1

## 2014-01-29 MED ORDER — CLONIDINE HCL 0.1 MG PO TABS
0.2000 mg | ORAL_TABLET | Freq: Every day | ORAL | Status: DC
  Administered 2014-01-29 – 2014-01-30 (×2): 0.2 mg via ORAL
  Filled 2014-01-29 (×2): qty 2

## 2014-01-29 MED ORDER — ATENOLOL 100 MG PO TABS
100.0000 mg | ORAL_TABLET | Freq: Every day | ORAL | Status: DC
  Administered 2014-01-29 – 2014-01-30 (×2): 100 mg via ORAL
  Filled 2014-01-29 (×2): qty 1

## 2014-01-29 MED ORDER — VITAMIN B-1 100 MG PO TABS
100.0000 mg | ORAL_TABLET | Freq: Every day | ORAL | Status: DC
  Administered 2014-01-29 – 2014-01-30 (×2): 100 mg via ORAL
  Filled 2014-01-29 (×2): qty 1

## 2014-01-29 MED ORDER — AMLODIPINE BESYLATE 10 MG PO TABS
10.0000 mg | ORAL_TABLET | Freq: Every day | ORAL | Status: DC
  Administered 2014-01-29 – 2014-01-30 (×2): 10 mg via ORAL
  Filled 2014-01-29 (×2): qty 1

## 2014-01-29 MED ORDER — THIAMINE HCL 100 MG/ML IJ SOLN: 100.0000 mg | Freq: Every day | INTRAMUSCULAR | Status: DC

## 2014-01-29 MED ORDER — METFORMIN HCL 500 MG PO TABS
500.0000 mg | ORAL_TABLET | Freq: Every day | ORAL | Status: DC
  Administered 2014-01-29 – 2014-01-30 (×2): 500 mg via ORAL
  Filled 2014-01-29 (×3): qty 1

## 2014-01-29 MED ORDER — ONDANSETRON HCL 4 MG PO TABS: 4.0000 mg | ORAL_TABLET | Freq: Three times a day (TID) | ORAL | Status: DC | PRN

## 2014-01-29 MED ORDER — ACETAMINOPHEN 325 MG PO TABS
650.0000 mg | ORAL_TABLET | ORAL | Status: DC | PRN
  Administered 2014-01-29: 650 mg via ORAL
  Filled 2014-01-29: qty 2

## 2014-01-29 MED ORDER — LORAZEPAM 1 MG PO TABS
0.0000 mg | ORAL_TABLET | Freq: Four times a day (QID) | ORAL | Status: DC
  Administered 2014-01-30: 1 mg via ORAL
  Filled 2014-01-29: qty 1

## 2014-01-29 NOTE — BH Assessment (Signed)
Attempted to talk to patient regarding ACT Team worker so that they could be contacted regarding patients discharge, patient was unable to give this writer the number to his ACT Team. Pt remained very drowsy and during my conversation with patient he went to sleep. [name] Boga NP made aware, per [name] patient is to remain in ER overnight and will reevaluate in the AM.

## 2014-01-29 NOTE — ED Notes (Signed)
TTS at bedside. 
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
## 2014-01-29 NOTE — ED Notes (Signed)
Pt belongings:  1 gray coat 1 pair of white socks 1 gray t-shirt 1 black flip-phone 1 pair of gray tennis shoes 1 pair of blue jeans

## 2014-01-29 NOTE — ED Notes (Signed)
Up to the desk on the phone 

## 2014-01-29 NOTE — ED Provider Notes (Signed)
CSN: [id number]     Arrival date & time 01/29/14  0406 History   First MD Initiated Contact with Patient 01/29/14 0434     Chief Complaint  Patient presents with  . Medical Clearance     (Consider location/radiation/quality/duration/timing/severity/associated sxs/prior Treatment) HPI 53 year old male presents to emergency department with complaint of hearing the devil voices telling him to hurt himself and hurt other people.  Patient is difficult to follow, he has pressured speech, and quickly jumps from subject to subject.  He seems to have a lot of anger towards his primary care doctor and others who have treated him in the past for his chronic pain.  Past Medical History  Diagnosis Date  . Drug abuse   . Alcohol abuse   . Diabetes mellitus   . Uncontrolled hypertension   . Arthritis   . Gout   . DTs (delirium tremens)     history of   . Crack cocaine use   . Noncompliance with medication regimen    Past Surgical History  Procedure Laterality Date  . Mandible reconstruction     Family History  Problem Relation Age of Onset  . Hypertension     History  Substance Use Topics  . Smoking status: Current Every Day Smoker -- 0.25 packs/day    Types: Cigarettes  . Smokeless tobacco: Never Used  . Alcohol Use: 1.8 oz/week    3 Cans of beer per week    Review of Systems  Unable to perform ROS: Psychiatric disorder      Allergies  Invega  Home Medications   Current Outpatient Rx  Name  Route  Sig  Dispense  Refill  . amLODipine (NORVASC) 10 MG tablet   Oral   Take 1 tablet (10 mg total) by mouth daily.   30 tablet   0   . atenolol (TENORMIN) 100 MG tablet   Oral   Take 1 tablet (100 mg total) by mouth daily.   30 tablet   1   . cloNIDine (CATAPRES) 0.2 MG tablet   Oral   Take 1 tablet (0.2 mg total) by mouth daily.   30 tablet   0   . metFORMIN (GLUCOPHAGE) 500 MG tablet   Oral   Take 1 tablet (500 mg total) by mouth daily. For diabetes management  30 tablet   0   . naproxen (NAPROSYN) 500 MG tablet   Oral   Take 1 tablet (500 mg total) by mouth 2 (two) times daily. Take with food   30 tablet   0    BP 135/95  Pulse 87  Temp(Src) 98.3 F (36.8 C) (Oral)  Resp 22  SpO2 99% Physical Exam  Nursing note and vitals reviewed. Constitutional: He appears well-developed and well-nourished. He appears distressed.  HENT:  Head: Normocephalic and atraumatic.  Right Ear: External ear normal.  Left Ear: External ear normal.  Nose: Nose normal.  Mouth/Throat: Oropharynx is clear and moist.  Eyes: Conjunctivae and EOM are normal. Pupils are equal, round, and reactive to light.  Neck: Normal range of motion. Neck supple. No JVD present. No tracheal deviation present. No thyromegaly present.  Cardiovascular: Normal rate, regular rhythm, normal heart sounds and intact distal pulses.  Exam reveals no gallop and no friction rub.   No murmur heard. Pulmonary/Chest: Effort normal and breath sounds normal. No stridor. No respiratory distress. He has no wheezes. He has no rales. He exhibits no tenderness.  Abdominal: Soft. Bowel sounds are normal. He exhibits no  distension and no mass. There is no tenderness. There is no rebound and no guarding.  Musculoskeletal: Normal range of motion. He exhibits no edema and no tenderness.  Lymphadenopathy:    He has no cervical adenopathy.  Neurological: He is alert. He exhibits normal muscle tone. Coordination normal.  Skin: Skin is warm and dry. No rash noted. No erythema. No pallor.  Psychiatric:  Pressured speech, suicidal, and homicidal statements.  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   EKG Interpretation None      MDM   Final diagnoses:  Psychosis  Polysubstance abuse    53 year old male with psychosis.  May be secondary to recent cocaine use, which he denied taking, or worsening of his underlying psychiatric disorder.  Given his threats, will need admission.    Kalman Drape, MD 01/29/14 [phone]
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
## 2014-01-29 NOTE — BH Assessment (Signed)
Assessment Note  [name] is an 53 y.o. male.   What led to ER visit  Pt has hx of schizoaffective disorder and came to ED due to hearing voices to harm self and others.  Pt reported to TTS he needed sleep and feels better this AM and is not hearing voices but "they will come as the usually do."  Pt denies any desire or intent to harm others or self.  Pt is treated by [facility] per pt but has been off his medications for an extended period of time.  Pt uses cocaine and alcohol and is positive for cocaine.    Stressors:  Off meds for a period of time  Strengths:  Family involved per pt - sometimes; treated by [facility] when pt goes to apt.  Suicidality:  Pt denies any intent or desire to harm self as of assessment  Homicidality:  Pt denies any intent or desire to harm others as of assessment  Psychosis:  Pt reports hearing voices regularly "they come and go" and appears to be used to living with illness.  Pt currently reports he is not experiencing voices "right now."  MSE by TTS:  Pt speech is garbled, slurred and difficult to understand at times, tangent thoughts, but relevant response to questions, Ox3, judgment appears impaired but possible baseline, disheveled in appearance, used profanity some in assessment but otherwise polite and cooperative.  Recommendation  Pt appears willing to cooperate with recommendations of psychiatry.  Pt does not have a great deal of respect for doctors in general but is fine with either discharge or inptx recommendation.  Psychiatry to determine.  Axis I: Schizoaffective Disorder and Substance Abuse Axis II: Deferred Axis III:  Past Medical History  Diagnosis Date  . Drug abuse   . Alcohol abuse   . Diabetes mellitus   . Uncontrolled hypertension   . Arthritis   . Gout   . DTs (delirium tremens)     history of   . Crack cocaine use   . Noncompliance with medication regimen    Axis IV: other psychosocial or environmental problems,  problems related to social environment and problems with primary support group Axis V: 31-40 impairment in reality testing  Past Medical History:  Past Medical History  Diagnosis Date  . Drug abuse   . Alcohol abuse   . Diabetes mellitus   . Uncontrolled hypertension   . Arthritis   . Gout   . DTs (delirium tremens)     history of   . Crack cocaine use   . Noncompliance with medication regimen     Past Surgical History  Procedure Laterality Date  . Mandible reconstruction      Family History:  Family History  Problem Relation Age of Onset  . Hypertension      Social History:  reports that he has been smoking Cigarettes.  He has been smoking about 0.25 packs per day. He has never used smokeless tobacco. He reports that he drinks about 1.8 ounces of alcohol per week. He reports that he does not use illicit drugs.  Additional Social History:  Alcohol / Drug Use Pain Medications: na Prescriptions: na Over the Counter: na History of alcohol / drug use?: Yes Longest period of sobriety (when/how long): na Negative Consequences of Use: Financial;Personal relationships Substance #1 Name of Substance 1: cocaine 1 - Age of First Use: 20s 1 - Amount (size/oz): varies 1 - Frequency: varies 1 - Duration: years 1 - Last Use / Amount:  01-25-14  CIWA: CIWA-Ar BP: 129/98 mmHg Pulse Rate: 89 COWS:    Allergies:  Allergies  Allergen Reactions  . Invega [Paliperidone] Shortness Of Breath    Vision changes    Home Medications:  (Not in a hospital admission)  OB/GYN Status:  No LMP for male patient.  General Assessment Data Location of Assessment: [facility] ED Is this a Tele or Face-to-Face Assessment?: Face-to-Face Is this an Initial Assessment or a Re-assessment for this encounter?: Initial Assessment Living Arrangements: Children Can pt return to current living arrangement?: Yes Admission Status: Voluntary Is patient capable of signing voluntary admission?: Yes Transfer  from: [facility] Referral Source: MD  Medical Screening Exam ([place]) Medical Exam completed: Yes  [name] Living Arrangements: Children [facility]rist: [facility] Name of Therapist: na  Education Status Is patient currently in school?: No Current Grade: na Highest grade of school patient has completed: na Name of school: na Contact person: na  Risk to self Suicidal Ideation: No-Not Currently/Within Last 6 Months Suicidal Intent: No-Not Currently/Within Last 6 Months Is patient at risk for suicide?: Yes Suicidal Plan?: No Access to Means: No What has been your use of drugs/alcohol within the last 12 months?: cocaine Previous Attempts/Gestures: Yes (past attempts/gestures) How many times?: 2 Other Self Harm Risks: na Triggers for Past Attempts: Unpredictable;Other (Comment) (psychsois) Intentional Self Injurious Behavior: None Family Suicide History: No Recent stressful life event(s): Other (Comment) (meds and sA) Persecutory voices/beliefs?: Yes Depression: Yes Depression Symptoms: Fatigue;Isolating;Guilt;Loss of interest in usual pleasures;Feeling worthless/self pity;Feeling angry/irritable Substance abuse history and/or treatment for substance abuse?: Yes Suicide prevention information given to non-admitted patients: Not applicable  Risk to Others Homicidal Ideation: No-Not Currently/Within Last 6 Months Thoughts of Harm to Others: No-Not Currently Present/Within Last 6 Months Current Homicidal Intent: No Current Homicidal Plan: No Access to Homicidal Means: No (can always get something to use to harm someone) Identified Victim: na History of harm to others?: No (threatening behaviors in past) Assessment of Violence: None Noted Violent Behavior Description: cooperative and polite; some profanity Does patient have access to weapons?: No (can always get something) Criminal Charges Pending?: No Does patient have a court date:  No  Psychosis Hallucinations: Auditory (hears voices but not current as of assessment) Delusions: Unspecified;Persecutory (not currently but upon arrival to ED)  Mental Status Report Appear/Hygiene: Disheveled Eye Contact: Fair Motor Activity: Restlessness Speech: Soft;Slurred;Tangential;Logical/coherent Level of Consciousness: Quiet/awake Mood: Depressed;Anxious;Suspicious;Sad Affect: Anxious;Appropriate to circumstance;Depressed;Irritable;Sad Anxiety Level: Moderate Thought Processes: Tangential;Relevant Judgement: Impaired Orientation: Person;Place;Situation Obsessive Compulsive Thoughts/Behaviors: Moderate  Cognitive Functioning Concentration: Decreased Memory: Recent Intact;Remote Intact IQ: Average Insight: Poor Impulse Control: Poor Appetite: Fair Weight Loss: 0 Weight Gain: 0 Sleep: Decreased Total Hours of Sleep: 3 Vegetative Symptoms: None  ADLScreening [facility] Assessment Services) Patient's cognitive ability adequate to safely complete daily activities?: Yes Patient able to express need for assistance with ADLs?: Yes Independently performs ADLs?: Yes (appropriate for developmental age)  Prior Inpatient Therapy Prior Inpatient Therapy: Yes Prior Therapy Dates: 2014 Prior Therapy Facilty/Provider(s): [facility] Reason for Treatment: psychosis  Prior Outpatient Therapy Prior Outpatient Therapy: Yes Prior Therapy Dates: current [facility] Therapy Facilty[facility]): [facility] Reason for Treatment: med mgt  ADL Screening (condition at time of admission) Patient's cognitive ability adequate to safely complete daily activities?: Yes Is the patient deaf or have difficulty hearing?: No Does the patient have difficulty seeing, even when wearing glasses/contacts?: No Does the patient have difficulty concentrating, remembering, or making decisions?: No Patient able to express need for assistance  with ADLs?: Yes Does the patient have difficulty dressing or bathing?:  No Independently performs ADLs?: Yes (appropriate for developmental age) Does the patient have difficulty walking or climbing stairs?: No Weakness of Legs: None Weakness of Arms/Hands: None  Home Assistive Devices/Equipment Home Assistive Devices/Equipment: None  Therapy Consults (therapy consults require a physician order) PT Evaluation Needed: No OT Evalulation Needed: No SLP Evaluation Needed: No Abuse/Neglect Assessment (Assessment to be complete while patient is alone) Physical Abuse: Denies Verbal Abuse: Denies Sexual Abuse: Denies Exploitation of patient/patient's resources: Denies Self-Neglect: Denies Values / Beliefs Cultural Requests During Hospitalization: None Spiritual Requests During Hospitalization: None Consults Spiritual Care Consult Needed: No Social Work Consult Needed: No Regulatory affairs officer (For Healthcare) Advance Directive: Patient does not have advance directive Pre-existing out of facility DNR order (yellow form or pink MOST form): No    Additional Information 1:1 In Past 12 Months?: No CIRT Risk: No Elopement Risk: No Does patient have medical clearance?: No     Disposition:  Disposition Initial Assessment Completed for this Encounter: Yes Disposition of Patient: Referred to (psychiatry to round on pt to determine dispo) Patient referred to: Other (Comment) (psychiatry to determine dispo)  On Site Evaluation by:   Reviewed with Physician:    Orlan Leavens, Graciela Husbands 01/29/2014 8:58 AM

## 2014-01-30 ENCOUNTER — Encounter (HOSPITAL_COMMUNITY): Payer: Self-pay | Admitting: Registered Nurse

## 2014-01-30 LAB — CBG MONITORING, ED
Glucose-Capillary: 223 mg/dL — ABNORMAL HIGH (ref 70–99)
Glucose-Capillary: 142 mg/dL — ABNORMAL HIGH (ref 70–99)

## 2014-01-30 NOTE — ED Notes (Signed)
Up to the bathroom 

## 2014-01-30 NOTE — ED Notes (Signed)
CSW in w/ pt

## 2014-01-30 NOTE — Discharge Instructions (Signed)
Finding Treatment for Alcohol and Drug Addiction It can be hard to find the right place to get professional treatment. Here are some important things to consider:  There are different types of treatment to choose from.  Some programs are live-in (residential) while others are not (outpatient). Sometimes a combination is offered.  No single type of program is right for everyone.  Most treatment programs involve a combination of education, counseling, and a 12-step, spiritually-based approach.  There are non-spiritually based programs (not 12-step).  Some treatment programs are government sponsored. They are geared for patients without private insurance.  Treatment programs can vary in many respects such as:  Cost and types of insurance accepted.  Types of on-site medical services offered.  Length of stay, setting, and size.  Overall philosophy of treatment. A person may need specialized treatment or have needs not addressed by all programs. For example, adolescents need treatment appropriate for their age. Other people have secondary disorders that must be managed as well. Secondary conditions can include mental illness, such as depression or diabetes. Often, a period of detoxification from alcohol or drugs is needed. This requires medical supervision and not all programs offer this. THINGS TO CONSIDER WHEN SELECTING A TREATMENT PROGRAM   Is the program certified by the appropriate government agency? Even private programs must be certified and employ certified professionals.  Does the program accept your insurance? If not, can a payment plan be set up?  Is the facility clean, organized, and well run? Do they allow you to speak with graduates who can share their treatment experience with you? Can you tour the facility? Can you meet with staff?  Does the program meet the full range of individual needs?  Does the treatment program address sexual orientation and physical disabilities?  Do they provide age, gender, and culturally appropriate treatment services?  Is treatment available in languages other than English?  Is long-term aftercare support or guidance encouraged and provided?  Is assessment of an individual's treatment plan ongoing to ensure it meets changing needs?  Does the program use strategies to encourage reluctant patients to remain in treatment long enough to increase the likelihood of success?  Does the program offer counseling (individual or group) and other behavioral therapies?  Does the program offer medicine as part of the treatment regimen, if needed?  Is there ongoing monitoring of possible relapse? Is there a defined relapse prevention program? Are services or referrals offered to family members to ensure they understand addiction and the recovery process? This would help them support the recovering individual.  Are 12-step meetings held at the center or is transport available for patients to attend outside meetings? In countries outside of the [place] and [place], [employer] for contact information for services in your area. Document Released: 10/03/2005 Document Revised: 01/27/2012 Document Reviewed: 04/14/2008 [facility] Patient Information 2014 [place].  Finding Treatment for Alcohol and Drug Addiction It can be hard to find the right place to get professional treatment. Here are some important things to consider:  There are different types of treatment to choose from.  Some programs are live-in (residential) while others are not (outpatient). Sometimes a combination is offered.  No single type of program is right for everyone.  Most treatment programs involve a combination of education, counseling, and a 12-step, spiritually-based approach.  There are non-spiritually based programs (not 12-step).  Some treatment programs are government sponsored. They are geared for patients without private insurance.  Treatment  programs can vary in  many respects such as:  Cost and types of insurance accepted.  Types of on-site medical services offered.  Length of stay, setting, and size.  Overall philosophy of treatment. A person may need specialized treatment or have needs not addressed by all programs. For example, adolescents need treatment appropriate for their age. Other people have secondary disorders that must be managed as well. Secondary conditions can include mental illness, such as depression or diabetes. Often, a period of detoxification from alcohol or drugs is needed. This requires medical supervision and not all programs offer this. THINGS TO CONSIDER WHEN SELECTING A TREATMENT PROGRAM   Is the program certified by the appropriate government agency? Even private programs must be certified and employ certified professionals.  Does the program accept your insurance? If not, can a payment plan be set up?  Is the facility clean, organized, and well run? Do they allow you to speak with graduates who can share their treatment experience with you? Can you tour the facility? Can you meet with staff?  Does the program meet the full range of individual needs?  Does the treatment program address sexual orientation and physical disabilities? Do they provide age, gender, and culturally appropriate treatment services?  Is treatment available in languages other than English?  Is long-term aftercare support or guidance encouraged and provided?  Is assessment of an individual's treatment plan ongoing to ensure it meets changing needs?  Does the program use strategies to encourage reluctant patients to remain in treatment long enough to increase the likelihood of success?  Does the program offer counseling (individual or group) and other behavioral therapies?  Does the program offer medicine as part of the treatment regimen, if needed?  Is there ongoing monitoring of possible relapse? Is there a defined  relapse prevention program? Are services or referrals offered to family members to ensure they understand addiction and the recovery process? This would help them support the recovering individual.  Are 12-step meetings held at the center or is transport available for patients to attend outside meetings? In countries outside of the [place] and [place], [employer] for contact information for services in your area. Document Released: 10/03/2005 Document Revised: 01/27/2012 Document Reviewed: 04/14/2008 [facility] Patient Information 2014 [place].

## 2014-01-30 NOTE — Progress Notes (Signed)
CSW met with pt to discuss dispo.  Pt and CSW called pt's ACTT at [facility] ([phone]). Pt is not technically a member of this ACTT--they are still waiting for approval from [place]. Nonetheless, ACTT provided supportive counseling. ACTT has pt's cell, so they will stay in contact with him until he is approved.  Pt called [facility]. Per pt, [facility] might have bed availability--he needs to check in later today. CSW provided further homelessness resources. Pt and CSW brainstormed other people he could call. Pt also called a woman he has been living with recently--per pt, he may be able to stay with her again tomorrow. CSW excused herself so pt could continue to contact supports.  Pt requested assistance with transportation--CSW offered bus pass, and pt thanked CSW.  Rochele Pages,     ED CSW  phone: [phone] 9:55am

## 2014-01-30 NOTE — Consult Note (Signed)
[facility] Follow Up Psychiatry Consult   Reason for Consult:  Cocaine and alcohol abuse Referring Physician:  ED MD [name] is an 53 y.o. male. Total Time spent with patient: 15 minutes  Assessment: AXIS I:  Alcohol Abuse, Substance Abuse and Substance Induced Mood Disorder AXIS II:  Deferred AXIS III:   Past Medical History  Diagnosis Date  . Drug abuse   . Alcohol abuse   . Diabetes mellitus   . Uncontrolled hypertension   . Arthritis   . Gout   . DTs (delirium tremens)     history of   . Crack cocaine use   . Noncompliance with medication regimen    AXIS IV:  housing problems, other psychosocial or environmental problems, problems related to social environment and problems with primary support group AXIS V:  61-70 mild symptoms  Plan:  No evidence of imminent risk to self or others at present.  He wants his ACT team to come so he can discharge home.  Dr. Louretta Shorten assessed the patient and concurs with the treatment plan.    Subjective:   [name] is a 53 y.o. male patient does not warrant admission at this time.  HPI:  Patient states that he is feeling better today.  Patient denies suicidal/homicidal ideation, psychosis, and paranoia.  Patient states that he is not interested in receiving help for substance abuse.  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  I can stay with my mom or in a shelter. I prefer the shelter cause they don't understand my bipolar."  I am good; I'm ready to go"  HPI Elements:   Location:  generalized. Quality:  acute. Severity:  severe. Timing:  intermittent. Duration:  few days. Context:  alcohol and cocaine use.  Past Psychiatric History: Past Medical History  Diagnosis Date  . Drug abuse   . Alcohol abuse   . Diabetes mellitus   . Uncontrolled hypertension   . Arthritis   . Gout   . DTs (delirium tremens)     history of   . Crack cocaine use   . Noncompliance with medication  regimen     reports that he has been smoking Cigarettes.  He has been smoking about 0.25 packs per day. He has never used smokeless tobacco. He reports that he drinks about 1.8 ounces of alcohol per week. He reports that he does not use illicit drugs. Family History  Problem Relation Age of Onset  . Hypertension     Family History Substance Abuse: No Family Supports: Yes, List: (children) Living Arrangements: Children Can pt return to current living arrangement?: Yes Abuse/Neglect [facility]) Physical Abuse: Denies Verbal Abuse: Denies Sexual Abuse: Denies Allergies:   Allergies  Allergen Reactions  . Invega [Paliperidone] Shortness Of Breath    Vision changes    ACT Assessment Complete:  Yes:    Educational Status    Risk to Self: Risk to self Suicidal Ideation: No-Not Currently/Within Last 6 Months Suicidal Intent: No-Not Currently/Within Last 6 Months Is patient at risk for suicide?: Yes Suicidal Plan?: No Access to Means: No What has been your use of drugs/alcohol within the last 12 months?: cocaine Previous Attempts/Gestures: Yes (past attempts/gestures) How many times?: 2 Other Self Harm Risks: na Triggers for Past Attempts: Unpredictable;Other (Comment) (psychsois) Intentional Self Injurious Behavior: None Family Suicide History: No Recent stressful life event(s): Other (Comment) (meds and sA) Persecutory voices/beliefs?: Yes Depression: Yes Depression Symptoms: Fatigue;Isolating;Guilt;Loss of interest in usual  pleasures;Feeling worthless/self pity;Feeling angry/irritable Substance abuse history and/or treatment for substance abuse?: Yes Suicide prevention information given to non-admitted patients: Not applicable  Risk to Others: Risk to Others Homicidal Ideation: No-Not Currently/Within Last 6 Months Thoughts of Harm to Others: No-Not Currently Present/Within Last 6 Months Current Homicidal Intent: No Current Homicidal Plan: No Access to Homicidal Means: No (can  always get something to use to harm someone) Identified Victim: na History of harm to others?: No (threatening behaviors in past) Assessment of Violence: None Noted Violent Behavior Description: cooperative and polite; some profanity Does patient have access to weapons?: No (can always get something) Criminal Charges Pending?: No Does patient have a court date: No  Abuse: Abuse/Neglect Assessment (Assessment to be complete while patient is alone) Physical Abuse: Denies Verbal Abuse: Denies Sexual Abuse: Denies Exploitation of patient/patient's resources: Denies Self-Neglect: Denies  Prior Inpatient Therapy: Prior Inpatient Therapy Prior Inpatient Therapy: Yes Prior Therapy Dates: 2014 Prior Therapy Facilty/Provider(s): [facility] Reason for Treatment: psychosis  Prior Outpatient Therapy: Prior Outpatient Therapy Prior Outpatient Therapy: Yes Prior Therapy Dates: current Prior Therapy Facilty[facility]Provider([facility]): [facility] Reason for Treatment: med mgt  Additional Information: Additional Information 1:1 In Past 12 Months?: No CIRT Risk: No Elopement Risk: No Does patient have medical clearance?: No    Objective: Blood pressure 137/94, pulse 75, temperature 97.7 F (36.5 C), temperature source Oral, resp. rate 20, SpO2 100.00%.There is no weight on file to calculate BMI. Results for orders placed during the hospital encounter of 01/29/14 (from the past 72 hour(s))  URINE RAPID DRUG SCREEN (HOSP PERFORMED)     Status: Abnormal   Collection Time    01/29/14  4:30 AM      Result Value Ref Range   Opiates NONE DETECTED  NONE DETECTED   Cocaine POSITIVE (*) NONE DETECTED   Benzodiazepines NONE DETECTED  NONE DETECTED   Amphetamines NONE DETECTED  NONE DETECTED   Tetrahydrocannabinol NONE DETECTED  NONE DETECTED   Barbiturates NONE DETECTED  NONE DETECTED   Comment:            DRUG SCREEN FOR MEDICAL PURPOSES     ONLY.  IF CONFIRMATION IS NEEDED     FOR ANY PURPOSE, NOTIFY LAB     WITHIN 5  DAYS.                LOWEST DETECTABLE LIMITS     FOR URINE DRUG SCREEN     Drug Class       Cutoff (ng/mL)     Amphetamine      1000     Barbiturate      200     Benzodiazepine   528     Tricyclics       413     Opiates          300     Cocaine          300     THC              50  CBC     Status: None   Collection Time    01/29/14  4:38 AM      Result Value Ref Range   WBC 7.6  4.0 - 10.5 K/uL   RBC 4.62  4.22 - 5.81 MIL/uL   Hemoglobin 13.6  13.0 - 17.0 g/dL   HCT 40.7  39.0 - 52.0 %   MCV 88.1  78.0 - 100.0 fL   MCH 29.4  26.0 - 34.0 pg  MCHC 33.4  30.0 - 36.0 g/dL   RDW 12.8  11.5 - 15.5 %   Platelets 186  150 - 400 K/uL  COMPREHENSIVE METABOLIC PANEL     Status: Abnormal   Collection Time    01/29/14  4:38 AM      Result Value Ref Range   Sodium 140  137 - 147 mEq/L   Potassium 4.1  3.7 - 5.3 mEq/L   Chloride 100  96 - 112 mEq/L   CO2 25  19 - 32 mEq/L   Glucose, Bld 187 (*) 70 - 99 mg/dL   BUN 13  6 - 23 mg/dL   Creatinine, Ser 0.84  0.50 - 1.35 mg/dL   Calcium 9.9  8.4 - 10.5 mg/dL   Total Protein 9.1 (*) 6.0 - 8.3 g/dL   Albumin 4.4  3.5 - 5.2 g/dL   AST 51 (*) 0 - 37 U/L   ALT 22  0 - 53 U/L   Alkaline Phosphatase 77  39 - 117 U/L   Total Bilirubin 0.4  0.3 - 1.2 mg/dL   GFR calc non Af Amer >90  >90 mL/min   GFR calc Af Amer >90  >90 mL/min   Comment: (NOTE)     The eGFR has been calculated using the CKD EPI equation.     This calculation has not been validated in all clinical situations.     eGFR's persistently <90 mL/min signify possible Chronic Kidney     Disease.  ETHANOL     Status: Abnormal   Collection Time    01/29/14  4:38 AM      Result Value Ref Range   Alcohol, Ethyl (B) 114 (*) 0 - 11 mg/dL   Comment:            LOWEST DETECTABLE LIMIT FOR     SERUM ALCOHOL IS 11 mg/dL     FOR MEDICAL PURPOSES ONLY  HEMOGLOBIN A1C     Status: Abnormal   Collection Time    01/29/14  4:38 AM      Result Value Ref Range   Hemoglobin A1C 8.0 (*) <5.7  %   Comment: (NOTE)                                                                               According to the ADA Clinical Practice Recommendations for 2011, when     HbA1c is used as a screening test:      >=6.5%   Diagnostic of Diabetes Mellitus               (if abnormal result is confirmed)     5.7-6.4%   Increased risk of developing Diabetes Mellitus     References:Diagnosis and Classification of Diabetes Mellitus,Diabetes     HUDJ,4970,26(VZCHY 1):S62-S69 and Standards of Medical Care in             Diabetes - 2011,Diabetes Care,2011,34 (Suppl 1):S11-S61.   Mean Plasma Glucose 183 (*) <117 mg/dL   Comment: Performed at [facility] MONITORING, ED     Status: Abnormal   Collection Time    01/29/14  4:56 AM      Result Value  Ref Range   Glucose-Capillary 184 (*) 70 - 99 mg/dL  CBG MONITORING, ED     Status: Abnormal   Collection Time    01/29/14  8:29 AM      Result Value Ref Range   Glucose-Capillary 172 (*) 70 - 99 mg/dL  CBG MONITORING, ED     Status: Abnormal   Collection Time    01/29/14 12:12 PM      Result Value Ref Range   Glucose-Capillary 152 (*) 70 - 99 mg/dL   Comment 1 Notify RN    CBG MONITORING, ED     Status: Abnormal   Collection Time    01/29/14  5:53 PM      Result Value Ref Range   Glucose-Capillary 190 (*) 70 - 99 mg/dL   Comment 1 Notify RN    CBG MONITORING, ED     Status: Abnormal   Collection Time    01/29/14  9:02 PM      Result Value Ref Range   Glucose-Capillary 115 (*) 70 - 99 mg/dL   Comment 1 Documented in Chart     Comment 2 Notify RN    CBG MONITORING, ED     Status: Abnormal   Collection Time    01/30/14  8:09 AM      Result Value Ref Range   Glucose-Capillary 223 (*) 70 - 99 mg/dL   Comment 1 Notify RN    CBG MONITORING, ED     Status: Abnormal   Collection Time    01/30/14 12:56 PM      Result Value Ref Range   Glucose-Capillary 142 (*) 70 - 99 mg/dL   Labs are reviewed and are pertinent for no medical  issues.  Medications review and no changes  Current Facility-Administered Medications  Medication Dose Route Frequency Provider Last Rate Last Dose  . acetaminophen (TYLENOL) tablet 650 mg  650 mg Oral Q4H PRN Kalman Drape, MD   650 mg at 01/29/14 1331  . amLODipine (NORVASC) tablet 10 mg  10 mg Oral Daily Kalman Drape, MD   10 mg at 01/30/14 1045  . atenolol (TENORMIN) tablet 100 mg  100 mg Oral Daily Kalman Drape, MD   100 mg at 01/30/14 1046  . cloNIDine (CATAPRES) tablet 0.2 mg  0.2 mg Oral Daily Kalman Drape, MD   0.2 mg at 01/30/14 1046  . ibuprofen (ADVIL,MOTRIN) tablet 600 mg  600 mg Oral Q8H PRN Kalman Drape, MD   600 mg at 01/29/14 1331  . insulin aspart (novoLOG) injection 0-15 Units  0-15 Units Subcutaneous TID WC Kalman Drape, MD   2 Units at 01/30/14 1345  . LORazepam (ATIVAN) tablet 0-4 mg  0-4 mg Oral 4 times per day Kalman Drape, MD   1 mg at 01/30/14 0630   Followed by  . [START ON 01/31/2014] LORazepam (ATIVAN) tablet 0-4 mg  0-4 mg Oral Q12H Kalman Drape, MD      . metFORMIN (GLUCOPHAGE) tablet 500 mg  500 mg Oral Q breakfast Kalman Drape, MD   500 mg at 01/30/14 8850  . naproxen (NAPROSYN) tablet 500 mg  500 mg Oral BID WC Waylan Boga, NP   500 mg at 01/30/14 0829  . ondansetron (ZOFRAN) tablet 4 mg  4 mg Oral Q8H PRN Kalman Drape, MD      . thiamine (VITAMIN B-1) tablet 100 mg  100 mg Oral Daily Kalman Drape, MD   100  mg at 01/30/14 1045   Or  . thiamine (B-1) injection 100 mg  100 mg Intravenous Daily Kalman Drape, MD       Current Outpatient Prescriptions  Medication Sig Dispense Refill  . amLODipine (NORVASC) 10 MG tablet Take 1 tablet (10 mg total) by mouth daily.  30 tablet  0  . atenolol (TENORMIN) 100 MG tablet Take 1 tablet (100 mg total) by mouth daily.  30 tablet  1  . cloNIDine (CATAPRES) 0.2 MG tablet Take 1 tablet (0.2 mg total) by mouth daily.  30 tablet  0  . metFORMIN (GLUCOPHAGE) 500 MG tablet Take 1 tablet (500 mg total) by mouth daily. For diabetes  management  30 tablet  0  . naproxen (NAPROSYN) 500 MG tablet Take 1 tablet (500 mg total) by mouth 2 (two) times daily. Take with food  30 tablet  0    Psychiatric Specialty Exam:     Blood pressure 137/94, pulse 75, temperature 97.7 F (36.5 C), temperature source Oral, resp. rate 20, SpO2 100.00%.There is no weight on file to calculate BMI.  General Appearance: Casual  Eye Contact::  Good  Speech:  Clear and Coherent and Pressured  Volume:  Normal  Mood:  "I'm good"  Affect:  Congruent  Thought Process:  Coherent  Orientation:  Full (Time, Place, and Person)  Thought Content:  Paranoia  Suicidal Thoughts:  No  Homicidal Thoughts:  No  Memory:  Immediate;   Fair Recent;   Fair Remote;   Fair  Judgement:  Fair  Insight:  Fair  Psychomotor Activity:  Decreased  Concentration:  Poor  Recall:  [place]: Fair  Akathisia:  No  Handed:  Right  AIMS (if indicated):     Assets:  Leisure Time Physical Health Resilience  Sleep:      Musculoskeletal: Strength & Muscle Tone: within normal limits Gait & Station: normal Patient leans: Right  Treatment Plan Summary: Referral for outpatient resources  Disposition:  Discharge home.  Give patient resource information for available outpatient services.  Earleen Newport, FNP-BC  01/30/2014 3:12 PM  Patient was seen face to face for psychiatric evaluation and case discussed with physician extender and formulated treatment plan. Reviewed the information documented and agree with the treatment plan.  Kelsy Polack,JANARDHAHA R. 01/30/2014 6:44 PM

## 2014-01-30 NOTE — ED Notes (Signed)
CSW into see 

## 2014-01-30 NOTE — ED Notes (Signed)
Written dc instructions reviewed w/ pt.  Pt encouraged to take his medication as directed,and follow up w/ his PMD.  Pt requested information about diabetic diet, information given.  Pt encouraged return/seek treatment for suicidal/homicidal thoughts,and to continue working to get established w/ a ACT team.  Pt verbalized understanding.  Bus pass give.  Pt ambulatory to dc window w/ mHt, belongings returned after leaving the unit.

## 2014-01-30 NOTE — ED Notes (Signed)
Up to the desk on the phone 

## 2014-01-30 NOTE — ED Notes (Signed)
Up tot he bathroom to shower and change scrubs 

## 2014-01-30 NOTE — ED Notes (Signed)
Dr Lenna Sciara and shuvon np into see

## 2014-06-10 ENCOUNTER — Emergency Department (HOSPITAL_COMMUNITY): Payer: Medicaid Other

## 2014-06-10 ENCOUNTER — Emergency Department (HOSPITAL_COMMUNITY)
Admission: EM | Admit: 2014-06-10 | Discharge: 2014-06-10 | Disposition: A | Discharge status: Home / Self Care | Payer: Medicaid Other | Attending: Emergency Medicine | Admitting: Emergency Medicine

## 2014-06-10 ENCOUNTER — Encounter (HOSPITAL_COMMUNITY): Payer: Self-pay | Admitting: Emergency Medicine

## 2014-06-10 DIAGNOSIS — F17200 Nicotine dependence, unspecified, uncomplicated: Secondary | ICD-10-CM | POA: Diagnosis not present

## 2014-06-10 DIAGNOSIS — R739 Hyperglycemia, unspecified: Secondary | ICD-10-CM

## 2014-06-10 DIAGNOSIS — I10 Essential (primary) hypertension: Secondary | ICD-10-CM | POA: Diagnosis not present

## 2014-06-10 DIAGNOSIS — Z79899 Other long term (current) drug therapy: Secondary | ICD-10-CM | POA: Insufficient documentation

## 2014-06-10 DIAGNOSIS — R112 Nausea with vomiting, unspecified: Secondary | ICD-10-CM | POA: Diagnosis not present

## 2014-06-10 DIAGNOSIS — Z8659 Personal history of other mental and behavioral disorders: Secondary | ICD-10-CM | POA: Insufficient documentation

## 2014-06-10 DIAGNOSIS — R0602 Shortness of breath: Secondary | ICD-10-CM | POA: Diagnosis not present

## 2014-06-10 DIAGNOSIS — R197 Diarrhea, unspecified: Secondary | ICD-10-CM | POA: Diagnosis not present

## 2014-06-10 DIAGNOSIS — R079 Chest pain, unspecified: Secondary | ICD-10-CM

## 2014-06-10 DIAGNOSIS — Z862 Personal history of diseases of the blood and blood-forming organs and certain disorders involving the immune mechanism: Secondary | ICD-10-CM | POA: Diagnosis not present

## 2014-06-10 DIAGNOSIS — E119 Type 2 diabetes mellitus without complications: Secondary | ICD-10-CM | POA: Insufficient documentation

## 2014-06-10 DIAGNOSIS — Z8739 Personal history of other diseases of the musculoskeletal system and connective tissue: Secondary | ICD-10-CM | POA: Insufficient documentation

## 2014-06-10 DIAGNOSIS — Z8639 Personal history of other endocrine, nutritional and metabolic disease: Secondary | ICD-10-CM | POA: Insufficient documentation

## 2014-06-10 LAB — COMPREHENSIVE METABOLIC PANEL
ALT: 34 U/L (ref 0–53)
AST: 64 U/L — AB (ref 0–37)
Albumin: 3.6 g/dL (ref 3.5–5.2)
Alkaline Phosphatase: 73 U/L (ref 39–117)
Anion gap: 13 (ref 5–15)
BUN: 14 mg/dL (ref 6–23)
CO2: 24 meq/L (ref 19–32)
Calcium: 8.7 mg/dL (ref 8.4–10.5)
Chloride: 95 mEq/L — ABNORMAL LOW (ref 96–112)
Creatinine, Ser: 0.97 mg/dL (ref 0.50–1.35)
GFR calc Af Amer: >90 mL/min (ref >90)
Glucose, Bld: 614 mg/dL (ref 70–99)
Potassium: 4.3 mEq/L (ref 3.7–5.3)
SODIUM: 132 meq/L — AB (ref 137–147)
TOTAL PROTEIN: 7.6 g/dL (ref 6.0–8.3)
Total Bilirubin: 0.3 mg/dL (ref 0.3–1.2)

## 2014-06-10 LAB — CBC
HCT: 37.5 % — ABNORMAL LOW (ref 39.0–52.0)
Hemoglobin: 12.1 g/dL — ABNORMAL LOW (ref 13.0–17.0)
MCH: 29.3 pg (ref 26.0–34.0)
MCHC: 32.3 g/dL (ref 30.0–36.0)
MCV: 90.8 fL (ref 78.0–100.0)
PLATELETS: 107 10*3/uL — AB (ref 150–400)
RBC: 4.13 MIL/uL — AB (ref 4.22–5.81)
RDW: 12.7 % (ref 11.5–15.5)
WBC: 4.5 10*3/uL (ref 4.0–10.5)

## 2014-06-10 LAB — URINALYSIS, ROUTINE W REFLEX MICROSCOPIC
Bilirubin Urine: NEGATIVE
HGB URINE DIPSTICK: NEGATIVE
Ketones, ur: NEGATIVE mg/dL
Leukocytes, UA: NEGATIVE
Nitrite: NEGATIVE
Protein, ur: NEGATIVE mg/dL
SPECIFIC GRAVITY, URINE: 1.034 — AB (ref 1.005–1.030)
Urobilinogen, UA: 0.2 mg/dL (ref 0.0–1.0)
pH: 5 (ref 5.0–8.0)

## 2014-06-10 LAB — I-STAT TROPONIN, ED
Troponin i, poc: 0.01 ng/mL (ref 0.00–0.08)
Troponin i, poc: 0.02 ng/mL (ref 0.00–0.08)

## 2014-06-10 LAB — ETHANOL: Alcohol, Ethyl (B): 93 mg/dL — ABNORMAL HIGH (ref 0–11)

## 2014-06-10 LAB — URINE MICROSCOPIC-ADD ON

## 2014-06-10 LAB — RAPID URINE DRUG SCREEN, HOSP PERFORMED
AMPHETAMINES: NOT DETECTED
Barbiturates: NOT DETECTED
Benzodiazepines: NOT DETECTED
Cocaine: NOT DETECTED
Opiates: NOT DETECTED
TETRAHYDROCANNABINOL: NOT DETECTED

## 2014-06-10 LAB — I-STAT VENOUS BLOOD GAS, ED
Bicarbonate: 25.2 mEq/L — ABNORMAL HIGH (ref 20.0–24.0)
O2 Saturation: 75 %
PCO2 VEN: 41.1 mmHg — AB (ref 45.0–50.0)
PO2 VEN: 41 mmHg (ref 30.0–45.0)
TCO2: 26 mmol/L (ref 0–100)
pH, Ven: 7.396 — ABNORMAL HIGH (ref 7.250–7.300)

## 2014-06-10 LAB — CBG MONITORING, ED
GLUCOSE-CAPILLARY: 586 mg/dL — AB (ref 70–99)
Glucose-Capillary: 416 mg/dL — ABNORMAL HIGH (ref 70–99)
Glucose-Capillary: 155 mg/dL — ABNORMAL HIGH (ref 70–99)

## 2014-06-10 MED ORDER — ONDANSETRON HCL 4 MG/2ML IJ SOLN
4.0000 mg | Freq: Once | INTRAMUSCULAR | Status: AC
  Administered 2014-06-10: 4 mg via INTRAVENOUS
  Filled 2014-06-10: qty 2

## 2014-06-10 MED ORDER — ONDANSETRON HCL 4 MG PO TABS: 4.0000 mg | ORAL_TABLET | Freq: Four times a day (QID) | ORAL | Status: DC

## 2014-06-10 MED ORDER — AMLODIPINE BESYLATE 5 MG PO TABS
10.0000 mg | ORAL_TABLET | Freq: Once | ORAL | Status: AC
  Administered 2014-06-10: 10 mg via ORAL
  Filled 2014-06-10: qty 2

## 2014-06-10 MED ORDER — ATENOLOL 50 MG PO TABS
50.0000 mg | ORAL_TABLET | Freq: Once | ORAL | Status: AC
  Administered 2014-06-10: 50 mg via ORAL
  Filled 2014-06-10: qty 1

## 2014-06-10 MED ORDER — HYDRALAZINE HCL 20 MG/ML IJ SOLN
5.0000 mg | INTRAMUSCULAR | Status: AC
  Administered 2014-06-10: 5 mg via INTRAVENOUS
  Filled 2014-06-10: qty 1

## 2014-06-10 MED ORDER — SODIUM CHLORIDE 0.9 % IV BOLUS (SEPSIS)
1000.0000 mL | Freq: Once | INTRAVENOUS | Status: AC
  Administered 2014-06-10: 1000 mL via INTRAVENOUS

## 2014-06-10 MED ORDER — INSULIN ASPART 100 UNIT/ML ~~LOC~~ SOLN
12.0000 [IU] | Freq: Once | SUBCUTANEOUS | Status: AC
  Administered 2014-06-10: 12 [IU] via INTRAVENOUS
  Filled 2014-06-10: qty 1

## 2014-06-10 MED ORDER — IBUPROFEN 800 MG PO TABS
800.0000 mg | ORAL_TABLET | Freq: Once | ORAL | Status: AC
  Administered 2014-06-10: 800 mg via ORAL
  Filled 2014-06-10: qty 1

## 2014-06-10 NOTE — ED Provider Notes (Signed)
Medical screening examination/treatment/procedure(s) were performed by non-physician practitioner and as supervising physician I was immediately available for consultation/collaboration.   EKG Interpretation   Date/Time:  Friday June 10 2014 16:02:09 EDT Ventricular Rate:  86 PR Interval:  186 QRS Duration: 103 QT Interval:  372 QTC Calculation: 445 R Axis:   42 Text Interpretation:  Sinus rhythm Probable left atrial enlargement Left  ventricular hypertrophy ST elevation, consider anterior injury No  significant change since last tracing Confirmed by Coda Mathey,  DO, Wally Shevchenko  [phone]) on 06/10/2014 4:06:14 PM        [place], DO 06/10/14 2039

## 2014-06-10 NOTE — Discharge Instructions (Signed)
Take the prescribed medication as directed as needed for nausea. Of your medications from the pharmacy. Follow-up with your primary care physician. Return to the ED for new or worsening symptoms.

## 2014-06-10 NOTE — ED Notes (Signed)
PA at the bedside. Made aware of the patient's glucose.

## 2014-06-10 NOTE — ED Notes (Signed)
Phlebotomy at the bedside  

## 2014-06-10 NOTE — ED Notes (Signed)
[name] at the bedside.

## 2014-06-10 NOTE — ED Notes (Signed)
Patient returned from X-ray 

## 2014-06-10 NOTE — ED Notes (Addendum)
PA Lattie Haw made aware of the patient's BP.

## 2014-06-10 NOTE — ED Provider Notes (Signed)
CSN: [id number]     Arrival date & time 06/10/14  1551 History   First MD Initiated Contact with Patient 06/10/14 1607     Chief Complaint  Patient presents with  . Shortness of Breath  . Chest Pain  . Hyperglycemia     (Consider location/radiation/quality/duration/timing/severity/associated sxs/prior Treatment) The history is provided by the patient and medical records.   This is a 53 year old male with past medical history significant for hypertension, diabetes, gout, drug abuse, and alcohol abuse, presenting to the ED for chest pain and shortness of breath for the past 3 days. States his pain is a "pressure" throughout his entire chest without any radiation to arms or neck. He denies any palpitations, diaphoresis, numbness, or paresthesias. Patient does have a history of drug use, but states he has been clean for the past several months. He doesn't get to drinking alcohol today, four tall boys.  Denies any kind of illicit drug use.  States he also has been feeling extremely nauseated with non-bloody emesis and diarrhea.  States he has not been taking any of his medications-- did not pick them up on 06/07/14 when were ready.  Denies recent abx use.  Admits to frequent urination and thirst.  Denies dysuria or hematuria.  No flank pain, fever, chills.  Past Medical History  Diagnosis Date  . Drug abuse   . Alcohol abuse   . Diabetes mellitus   . Uncontrolled hypertension   . Arthritis   . Gout   . DTs (delirium tremens)     history of   . Crack cocaine use   . Noncompliance with medication regimen    Past Surgical History  Procedure Laterality Date  . Mandible reconstruction     Family History  Problem Relation Age of Onset  . Hypertension     History  Substance Use Topics  . Smoking status: Current Every Day Smoker -- 0.25 packs/day    Types: Cigarettes  . Smokeless tobacco: Never Used  . Alcohol Use: 1.8 oz/week    3 Cans of beer per week    Review of Systems   Gastrointestinal: Positive for nausea, vomiting and diarrhea.  All other systems reviewed and are negative.     Allergies  Invega  Home Medications   Prior to Admission medications   Medication Sig Start Date End Date Taking? Authorizing Provider  atenolol (TENORMIN) 50 MG tablet Take 100 mg by mouth daily.   Yes Historical Provider, MD  cloNIDine (CATAPRES) 0.2 MG tablet Take 0.2 mg by mouth 2 (two) times daily.   Yes Historical Provider, MD  metFORMIN (GLUCOPHAGE) 500 MG tablet Take 500 mg by mouth 2 (two) times daily with a meal.   Yes Historical Provider, MD  risperidone (RISPERDAL) 4 MG tablet Take 4 mg by mouth at bedtime.   Yes Historical Provider, MD  amLODipine (NORVASC) 10 MG tablet Take 1 tablet (10 mg total) by mouth daily. 01/15/14   Sherrie George, PA-C   BP 184/116  Pulse 83  Temp(Src) 98.3 F (36.8 C) (Oral)  Resp 19  Ht 5\' 11"  (1.803 m)  Wt 207 lb (93.895 kg)  BMI 28.88 kg/m2  SpO2 97%  Physical Exam  Nursing note and vitals reviewed. Constitutional: He is oriented to person, place, and time. He appears well-developed and well-nourished. No distress.  Appears intoxicated  HENT:  Head: Normocephalic and atraumatic.  Mouth/Throat: Oropharynx is clear and moist.  Eyes: Conjunctivae and EOM are normal. Pupils are equal, round, and reactive  to light.  Pupils pinpoint but reactive  Neck: Normal range of motion. Neck supple.  Cardiovascular: Normal rate, regular rhythm and normal heart sounds.   Pulmonary/Chest: Effort normal and breath sounds normal. No respiratory distress. He has no wheezes.  Abdominal: Soft. Bowel sounds are normal. There is no tenderness. There is no guarding and no CVA tenderness.  Abdomen soft, non-distended, no peritoneal signs  Musculoskeletal: Normal range of motion. He exhibits no edema.  Neurological: He is alert and oriented to person, place, and time.  AAOx3, answering questions appropriately; equal strength UE and LE  bilaterally; CN grossly intact; moves all extremities appropriately without ataxia; no focal neuro deficits or facial asymmetry appreciated  Skin: Skin is warm and dry. He is not diaphoretic.  Psychiatric: He has a normal mood and affect.    ED Course  Procedures (including critical care time) Labs Review Labs Reviewed  CBC - Abnormal; Notable for the following:    RBC 4.13 (*)    Hemoglobin 12.1 (*)    HCT 37.5 (*)    Platelets 107 (*)    All other components within normal limits  COMPREHENSIVE METABOLIC PANEL - Abnormal; Notable for the following:    Sodium 132 (*)    Chloride 95 (*)    Glucose, Bld 614 (*)    AST 64 (*)    All other components within normal limits  URINALYSIS, ROUTINE W REFLEX MICROSCOPIC - Abnormal; Notable for the following:    Specific Gravity, Urine 1.034 (*)    Glucose, UA >1000 (*)    All other components within normal limits  ETHANOL - Abnormal; Notable for the following:    Alcohol, Ethyl (B) 93 (*)    All other components within normal limits  URINE MICROSCOPIC-ADD ON - Abnormal; Notable for the following:    Casts GRANULAR CAST (*)    All other components within normal limits  CBG MONITORING, ED - Abnormal; Notable for the following:    Glucose-Capillary 586 (*)    All other components within normal limits  I-STAT VENOUS BLOOD GAS, ED - Abnormal; Notable for the following:    pH, Ven 7.396 (*)    pCO2, Ven 41.1 (*)    Bicarbonate 25.2 (*)    All other components within normal limits  URINE RAPID DRUG SCREEN (HOSP PERFORMED)  I-STAT TROPOININ, ED  CBG MONITORING, ED    Imaging Review Dg Chest 2 View  06/10/2014   CLINICAL DATA:  Chest pain.  EXAM: CHEST  2 VIEW  COMPARISON:  02/13/2014 .  FINDINGS: Mediastinum and hilar structures normal. Lungs are clear. Heart size normal. No pleural effusion or pneumothorax. Degenerative changes thoracic spine.  IMPRESSION: No acute cardiopulmonary disease.   Electronically Signed   By: Marcello Moores  Register    On: 06/10/2014 18:52     EKG Interpretation   Date/Time:  Friday June 10 2014 16:02:09 EDT Ventricular Rate:  86 PR Interval:  186 QRS Duration: 103 QT Interval:  372 QTC Calculation: 445 R Axis:   42 Text Interpretation:  Sinus rhythm Probable left atrial enlargement Left  ventricular hypertrophy ST elevation, consider anterior injury No  significant change since last tracing Confirmed by WARD,  DO, KRISTEN  [id number]) on 06/10/2014 4:06:14 PM      MDM   Final diagnoses:  Hyperglycemia  Essential hypertension  Chest pain, unspecified chest pain type  Shortness of breath   24 -year-old male with complaint of chest pain and shortness of breath, described as a vague pressure.  Patient has history of cocaine abuse, but none recently. EtOH on board. On exam, patient does appear intoxicated, but he is able to answer questions and follow commands when prompted. He has no cardiac history.  Initial CBG was 586.  IV fluid boluses started, will obtain basic labs, venous blood gas, and chest x-ray.  Patient hypertensive at this time, has not been taking his home medications, VS otherwise stable.  EKG sinus rhythm, largely unchanged from previous.  Trop negative.  CXR clear.  UDS negative, ethanol 93.  Basic labs are reassuring, anion gap remains normal at 13. UA without ketones.  VBG largely WNL. Patient hyperglycemic without evidence of DKA. He will be aggressively hydrated and given insulin for glucose control. He was given a dose of his home blood pressure medications as well as IV hydralazine.  No signs of end organ damage at this time, tolerating PO fluids well.  Low suspicion for ACS, PE, dissection, or other acute cardiac event at this time.  Care signed out to NP Olean Ree at shift change.  Pt currently on his third liter of fluids.  Will monitor BP and CBG and dispo accordingly.  Pt has refills of his meds at pharmacy that he needs to pick up, i have written for PO zofran. He will FU with his  PCP.  Larene Pickett, PA-C 06/10/14 2011  Larene Pickett, PA-C 06/10/14 2014

## 2014-06-10 NOTE — ED Provider Notes (Signed)
Medical screening examination/treatment/procedure(s) were performed by non-physician practitioner and as supervising physician I was immediately available for consultation/collaboration.   EKG Interpretation   Date/Time:  Friday June 10 2014 16:02:09 EDT Ventricular Rate:  86 PR Interval:  186 QRS Duration: 103 QT Interval:  372 QTC Calculation: 445 R Axis:   42 Text Interpretation:  Sinus rhythm Probable left atrial enlargement Left  ventricular hypertrophy ST elevation, consider anterior injury No  significant change since last tracing Confirmed by WARD,  DO, KRISTEN  [phone]) on 06/10/2014 4:06:14 PM        [place], DO 06/10/14 2153

## 2014-06-10 NOTE — ED Notes (Signed)
Per EMS, Patient complained of Shortness of Breath and Chest pain for about three days. Patient complains of pressure in the chest with no radiation. Patient reports being sober from crack cocaine use, but patient has had alcohol today. Patient has a history of noncompliance with diabetes. Patient given 324 mg of Aspirin and 2 SL Nitro. Patient reported no relief. Vitals per EMS: NSR 98 HR, 144/103, 98 %, High Reading for CBG.

## 2014-06-10 NOTE — ED Provider Notes (Signed)
Second troponin negative BS normalized  Garald Balding, NP 06/10/14 2138

## 2014-06-25 ENCOUNTER — Emergency Department (HOSPITAL_COMMUNITY)
Admission: EM | Admit: 2014-06-25 | Discharge: 2014-06-26 | Disposition: A | Discharge status: Other Acute Inpatient Hospital | Payer: Medicaid Other | Attending: Emergency Medicine | Admitting: Emergency Medicine

## 2014-06-25 ENCOUNTER — Encounter (HOSPITAL_COMMUNITY): Payer: Self-pay | Admitting: Emergency Medicine

## 2014-06-25 DIAGNOSIS — E119 Type 2 diabetes mellitus without complications: Secondary | ICD-10-CM | POA: Insufficient documentation

## 2014-06-25 DIAGNOSIS — F1410 Cocaine abuse, uncomplicated: Secondary | ICD-10-CM | POA: Diagnosis not present

## 2014-06-25 DIAGNOSIS — F39 Unspecified mood [affective] disorder: Secondary | ICD-10-CM | POA: Insufficient documentation

## 2014-06-25 DIAGNOSIS — I10 Essential (primary) hypertension: Secondary | ICD-10-CM | POA: Insufficient documentation

## 2014-06-25 DIAGNOSIS — Z008 Encounter for other general examination: Secondary | ICD-10-CM | POA: Diagnosis present

## 2014-06-25 DIAGNOSIS — F17200 Nicotine dependence, unspecified, uncomplicated: Secondary | ICD-10-CM | POA: Insufficient documentation

## 2014-06-25 DIAGNOSIS — F1010 Alcohol abuse, uncomplicated: Secondary | ICD-10-CM

## 2014-06-25 DIAGNOSIS — R45851 Suicidal ideations: Secondary | ICD-10-CM

## 2014-06-25 DIAGNOSIS — F10230 Alcohol dependence with withdrawal, uncomplicated: Secondary | ICD-10-CM

## 2014-06-25 DIAGNOSIS — Z8739 Personal history of other diseases of the musculoskeletal system and connective tissue: Secondary | ICD-10-CM | POA: Insufficient documentation

## 2014-06-25 DIAGNOSIS — F10239 Alcohol dependence with withdrawal, unspecified: Secondary | ICD-10-CM | POA: Diagnosis present

## 2014-06-25 DIAGNOSIS — F10939 Alcohol use, unspecified with withdrawal, unspecified: Secondary | ICD-10-CM | POA: Diagnosis present

## 2014-06-25 DIAGNOSIS — F1994 Other psychoactive substance use, unspecified with psychoactive substance-induced mood disorder: Secondary | ICD-10-CM

## 2014-06-25 DIAGNOSIS — F10930 Alcohol use, unspecified with withdrawal, uncomplicated: Secondary | ICD-10-CM

## 2014-06-25 DIAGNOSIS — Z79899 Other long term (current) drug therapy: Secondary | ICD-10-CM | POA: Insufficient documentation

## 2014-06-25 DIAGNOSIS — F1910 Other psychoactive substance abuse, uncomplicated: Secondary | ICD-10-CM

## 2014-06-25 LAB — COMPREHENSIVE METABOLIC PANEL
ALT: 42 U/L (ref 0–53)
AST: 54 U/L — AB (ref 0–37)
Albumin: 3.9 g/dL (ref 3.5–5.2)
Alkaline Phosphatase: 77 U/L (ref 39–117)
Anion gap: 16 — ABNORMAL HIGH (ref 5–15)
BILIRUBIN TOTAL: 0.3 mg/dL (ref 0.3–1.2)
BUN: 12 mg/dL (ref 6–23)
CALCIUM: 9.6 mg/dL (ref 8.4–10.5)
CO2: 23 meq/L (ref 19–32)
CREATININE: 0.88 mg/dL (ref 0.50–1.35)
Chloride: 98 mEq/L (ref 96–112)
GLUCOSE: 270 mg/dL — AB (ref 70–99)
Potassium: 4.1 mEq/L (ref 3.7–5.3)
Sodium: 137 mEq/L (ref 137–147)
Total Protein: 8.5 g/dL — ABNORMAL HIGH (ref 6.0–8.3)

## 2014-06-25 LAB — CBC WITH DIFFERENTIAL/PLATELET
BASOS ABS: 0 10*3/uL (ref 0.0–0.1)
Basophils Relative: 0 % (ref 0–1)
Eosinophils Absolute: 0.1 10*3/uL (ref 0.0–0.7)
Eosinophils Relative: 2 % (ref 0–5)
HEMATOCRIT: 38.1 % — AB (ref 39.0–52.0)
Hemoglobin: 12.6 g/dL — ABNORMAL LOW (ref 13.0–17.0)
LYMPHS PCT: 41 % (ref 12–46)
Lymphs Abs: 2.5 10*3/uL (ref 0.7–4.0)
MCH: 28.7 pg (ref 26.0–34.0)
MCHC: 33.1 g/dL (ref 30.0–36.0)
MCV: 86.8 fL (ref 78.0–100.0)
MONO ABS: 0.6 10*3/uL (ref 0.1–1.0)
MONOS PCT: 10 % (ref 3–12)
Neutro Abs: 2.9 10*3/uL (ref 1.7–7.7)
Neutrophils Relative %: 47 % (ref 43–77)
Platelets: 127 10*3/uL — ABNORMAL LOW (ref 150–400)
RBC: 4.39 MIL/uL (ref 4.22–5.81)
RDW: 12.4 % (ref 11.5–15.5)
WBC: 6.1 10*3/uL (ref 4.0–10.5)

## 2014-06-25 LAB — CBG MONITORING, ED
GLUCOSE-CAPILLARY: 273 mg/dL — AB (ref 70–99)
GLUCOSE-CAPILLARY: 306 mg/dL — AB (ref 70–99)
Glucose-Capillary: 265 mg/dL — ABNORMAL HIGH (ref 70–99)
Glucose-Capillary: 262 mg/dL — ABNORMAL HIGH (ref 70–99)

## 2014-06-25 LAB — RAPID URINE DRUG SCREEN, HOSP PERFORMED
Amphetamines: NOT DETECTED
BENZODIAZEPINES: NOT DETECTED
Barbiturates: NOT DETECTED
Cocaine: POSITIVE — AB
OPIATES: NOT DETECTED
Tetrahydrocannabinol: NOT DETECTED

## 2014-06-25 LAB — ETHANOL: Alcohol, Ethyl (B): 74 mg/dL — ABNORMAL HIGH (ref 0–11)

## 2014-06-25 MED ORDER — INSULIN ASPART 100 UNIT/ML ~~LOC~~ SOLN: 0.0000 [IU] | Freq: Three times a day (TID) | SUBCUTANEOUS | Status: DC

## 2014-06-25 MED ORDER — LORAZEPAM 1 MG PO TABS
0.0000 mg | ORAL_TABLET | Freq: Four times a day (QID) | ORAL | Status: DC
  Administered 2014-06-25 – 2014-06-26 (×3): 1 mg via ORAL
  Filled 2014-06-25 (×3): qty 1

## 2014-06-25 MED ORDER — ACETAMINOPHEN 325 MG PO TABS: 650.0000 mg | ORAL_TABLET | ORAL | Status: DC | PRN

## 2014-06-25 MED ORDER — ONDANSETRON HCL 4 MG PO TABS
4.0000 mg | ORAL_TABLET | Freq: Three times a day (TID) | ORAL | Status: DC | PRN
Start: 2014-06-25 — End: 2014-06-26
  Administered 2014-06-25: 4 mg via ORAL
  Filled 2014-06-25: qty 1

## 2014-06-25 MED ORDER — INSULIN ASPART 100 UNIT/ML ~~LOC~~ SOLN
0.0000 [IU] | Freq: Three times a day (TID) | SUBCUTANEOUS | Status: DC
  Administered 2014-06-25: 11 [IU] via SUBCUTANEOUS
  Filled 2014-06-25: qty 1

## 2014-06-25 MED ORDER — INSULIN ASPART 100 UNIT/ML ~~LOC~~ SOLN
0.0000 [IU] | Freq: Three times a day (TID) | SUBCUTANEOUS | Status: DC
  Administered 2014-06-26: 8 [IU] via SUBCUTANEOUS
  Administered 2014-06-26 (×2): 5 [IU] via SUBCUTANEOUS
  Filled 2014-06-25 (×2): qty 1

## 2014-06-25 MED ORDER — RISPERIDONE 2 MG PO TABS
4.0000 mg | ORAL_TABLET | Freq: Every day | ORAL | Status: DC
  Administered 2014-06-25 (×2): 4 mg via ORAL
  Filled 2014-06-25 (×2): qty 2

## 2014-06-25 MED ORDER — METFORMIN HCL 500 MG PO TABS
500.0000 mg | ORAL_TABLET | Freq: Two times a day (BID) | ORAL | Status: DC
  Administered 2014-06-25 – 2014-06-26 (×3): 500 mg via ORAL
  Filled 2014-06-25 (×4): qty 1

## 2014-06-25 MED ORDER — INSULIN ASPART 100 UNIT/ML ~~LOC~~ SOLN
0.0000 [IU] | Freq: Every day | SUBCUTANEOUS | Status: DC
  Administered 2014-06-25: 2 [IU] via SUBCUTANEOUS
  Filled 2014-06-25: qty 1

## 2014-06-25 MED ORDER — ZOLPIDEM TARTRATE 5 MG PO TABS
5.0000 mg | ORAL_TABLET | Freq: Every evening | ORAL | Status: DC | PRN
  Administered 2014-06-25: 5 mg via ORAL
  Filled 2014-06-25: qty 1

## 2014-06-25 MED ORDER — IBUPROFEN 200 MG PO TABS
600.0000 mg | ORAL_TABLET | Freq: Three times a day (TID) | ORAL | Status: DC | PRN
  Administered 2014-06-25 – 2014-06-26 (×3): 600 mg via ORAL
  Filled 2014-06-25 (×3): qty 3

## 2014-06-25 MED ORDER — AMLODIPINE BESYLATE 10 MG PO TABS
10.0000 mg | ORAL_TABLET | Freq: Every day | ORAL | Status: DC
  Administered 2014-06-25 – 2014-06-26 (×2): 10 mg via ORAL
  Filled 2014-06-25 (×2): qty 1

## 2014-06-25 MED ORDER — ALUM & MAG HYDROXIDE-SIMETH 200-200-20 MG/5ML PO SUSP: 30.0000 mL | ORAL | Status: DC | PRN

## 2014-06-25 MED ORDER — CLONIDINE HCL 0.1 MG PO TABS
0.2000 mg | ORAL_TABLET | Freq: Two times a day (BID) | ORAL | Status: DC
  Administered 2014-06-25 – 2014-06-26 (×4): 0.2 mg via ORAL
  Filled 2014-06-25 (×4): qty 2

## 2014-06-25 MED ORDER — LORAZEPAM 1 MG PO TABS: 0.0000 mg | ORAL_TABLET | Freq: Two times a day (BID) | ORAL | Status: DC

## 2014-06-25 MED ORDER — THIAMINE HCL 100 MG/ML IJ SOLN: 100.0000 mg | Freq: Every day | INTRAMUSCULAR | Status: DC

## 2014-06-25 MED ORDER — ATENOLOL 100 MG PO TABS
100.0000 mg | ORAL_TABLET | Freq: Every day | ORAL | Status: DC
  Administered 2014-06-25 – 2014-06-26 (×2): 100 mg via ORAL
  Filled 2014-06-25 (×2): qty 1

## 2014-06-25 MED ORDER — NICOTINE 21 MG/24HR TD PT24: 21.0000 mg | MEDICATED_PATCH | Freq: Every day | TRANSDERMAL | Status: DC

## 2014-06-25 MED ORDER — VITAMIN B-1 100 MG PO TABS
100.0000 mg | ORAL_TABLET | Freq: Every day | ORAL | Status: DC
  Administered 2014-06-25 – 2014-06-26 (×2): 100 mg via ORAL
  Filled 2014-06-25 (×2): qty 1
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
## 2014-06-25 NOTE — ED Notes (Signed)
Pt has been wanded by security. 

## 2014-06-25 NOTE — BH Assessment (Signed)
Called to arrange tele-assessment. RN reports Pt is asleep and unable to participate in assessment at this time. TTS will check back later.  Orpah Greek Rosana Hoes, [facility] Triage Specialist [phone]

## 2014-06-25 NOTE — ED Notes (Signed)
Pt has been changed into scrubs. Pt belongings are at [employer].

## 2014-06-25 NOTE — Progress Notes (Addendum)
The following facilities have been contacted regarding inptx bed availability on pt's behalf:  REFERRAL FAXED [facility]- per Hoyle Sauer can fax for wait list [facility] [place]- per Irving Shows can fax [facility]arl[facility] Motts- per [place] accepting referrals [place]- per Juliann Pulse 1 bed available  [facility] [place]- per [facility]- per Ronny Flurry- per Til[facility] Neat- per [facility]- per Shan Levans- per [facility]- per Lars Pinks- per [facility]- per [facility]- per Rolene Course- per Leandra Kern- only gero beds available [place] per [place] per [facility]- per [place], male bed only  **Rosana Hoes, [place], and [place] no SA tx.  [facility] does not accept adult [facility].   Rick Duff Disposition MHT
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
## 2014-06-25 NOTE — ED Notes (Signed)
Bed: WA14 Expected date:  Expected time:  Means of arrival:  Comments: EMS 
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
## 2014-06-25 NOTE — ED Notes (Signed)
Patient ate 100% of his meal. 

## 2014-06-25 NOTE — BH Assessment (Signed)
Received call for assessment. Spoke with Dr. Noemi Chapel who said to proceed with assessment.  Orpah Greek Rosana Hoes, [facility] Triage Specialist [phone]

## 2014-06-25 NOTE — ED Provider Notes (Signed)
Medical screening examination/treatment/procedure(s) were performed by non-physician practitioner and as supervising physician I was immediately available for consultation/collaboration.    Johnna Acosta, MD 06/25/14 [phone]
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
## 2014-06-25 NOTE — BHH Counselor (Signed)
Spoke with Dr. Wilson Singer prior to assessing PT.  Provided disposition of the PT after assessment.
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
## 2014-06-26 ENCOUNTER — Encounter (HOSPITAL_COMMUNITY): Payer: Self-pay | Admitting: Psychiatry

## 2014-06-26 ENCOUNTER — Encounter (HOSPITAL_COMMUNITY): Payer: Self-pay | Admitting: *Deleted

## 2014-06-26 ENCOUNTER — Observation Stay (HOSPITAL_COMMUNITY)
Admission: AD | Admit: 2014-06-26 | Discharge: 2014-06-28 | Disposition: A | Discharge status: Home / Self Care | Payer: Medicaid Other | Source: Intra-hospital | Attending: Psychiatry | Admitting: Psychiatry

## 2014-06-26 DIAGNOSIS — E119 Type 2 diabetes mellitus without complications: Secondary | ICD-10-CM | POA: Diagnosis not present

## 2014-06-26 DIAGNOSIS — F10230 Alcohol dependence with withdrawal, uncomplicated: Secondary | ICD-10-CM

## 2014-06-26 DIAGNOSIS — I10 Essential (primary) hypertension: Secondary | ICD-10-CM | POA: Insufficient documentation

## 2014-06-26 DIAGNOSIS — F1994 Other psychoactive substance use, unspecified with psychoactive substance-induced mood disorder: Secondary | ICD-10-CM | POA: Insufficient documentation

## 2014-06-26 DIAGNOSIS — F1020 Alcohol dependence, uncomplicated: Secondary | ICD-10-CM | POA: Diagnosis present

## 2014-06-26 DIAGNOSIS — F17200 Nicotine dependence, unspecified, uncomplicated: Secondary | ICD-10-CM | POA: Insufficient documentation

## 2014-06-26 DIAGNOSIS — F1410 Cocaine abuse, uncomplicated: Secondary | ICD-10-CM | POA: Diagnosis not present

## 2014-06-26 DIAGNOSIS — Z9119 Patient's noncompliance with other medical treatment and regimen: Secondary | ICD-10-CM | POA: Insufficient documentation

## 2014-06-26 DIAGNOSIS — F32A Depression, unspecified: Secondary | ICD-10-CM | POA: Diagnosis present

## 2014-06-26 DIAGNOSIS — Z91199 Patient's noncompliance with other medical treatment and regimen due to unspecified reason: Secondary | ICD-10-CM | POA: Insufficient documentation

## 2014-06-26 DIAGNOSIS — F329 Major depressive disorder, single episode, unspecified: Secondary | ICD-10-CM | POA: Diagnosis present

## 2014-06-26 LAB — CBG MONITORING, ED
GLUCOSE-CAPILLARY: 242 mg/dL — AB (ref 70–99)
GLUCOSE-CAPILLARY: 293 mg/dL — AB (ref 70–99)
Glucose-Capillary: 205 mg/dL — ABNORMAL HIGH (ref 70–99)

## 2014-06-26 MED ORDER — ACETAMINOPHEN 325 MG PO TABS
650.0000 mg | ORAL_TABLET | Freq: Four times a day (QID) | ORAL | Status: DC | PRN
  Administered 2014-06-27: 650 mg via ORAL
  Filled 2014-06-26: qty 2

## 2014-06-26 MED ORDER — ALUM & MAG HYDROXIDE-SIMETH 200-200-20 MG/5ML PO SUSP: 30.0000 mL | ORAL | Status: DC | PRN

## 2014-06-26 MED ORDER — ATENOLOL 100 MG PO TABS
100.0000 mg | ORAL_TABLET | Freq: Every day | ORAL | Status: DC
  Administered 2014-06-27 – 2014-06-28 (×2): 100 mg via ORAL
  Filled 2014-06-26: qty 4
  Filled 2014-06-26 (×2): qty 1

## 2014-06-26 MED ORDER — AMLODIPINE BESYLATE 10 MG PO TABS
10.0000 mg | ORAL_TABLET | Freq: Every day | ORAL | Status: DC
  Administered 2014-06-27: 10 mg via ORAL
  Filled 2014-06-26 (×2): qty 1
  Filled 2014-06-26: qty 2

## 2014-06-26 MED ORDER — METFORMIN HCL 500 MG PO TABS
500.0000 mg | ORAL_TABLET | Freq: Two times a day (BID) | ORAL | Status: DC
Start: 2014-06-27 — End: 2014-06-27
  Administered 2014-06-27: 500 mg via ORAL
  Filled 2014-06-26 (×4): qty 1

## 2014-06-26 MED ORDER — TRAZODONE HCL 50 MG PO TABS
50.0000 mg | ORAL_TABLET | Freq: Every evening | ORAL | Status: DC | PRN
  Administered 2014-06-26 – 2014-06-27 (×3): 50 mg via ORAL
  Filled 2014-06-26 (×3): qty 1

## 2014-06-26 MED ORDER — CLONIDINE HCL 0.2 MG PO TABS
0.2000 mg | ORAL_TABLET | Freq: Two times a day (BID) | ORAL | Status: DC
  Administered 2014-06-27 – 2014-06-28 (×3): 0.2 mg via ORAL
  Filled 2014-06-26 (×4): qty 1
  Filled 2014-06-26 (×2): qty 2

## 2014-06-26 MED ORDER — MAGNESIUM HYDROXIDE 400 MG/5ML PO SUSP: 30.0000 mL | Freq: Every day | ORAL | Status: DC | PRN

## 2014-06-26 MED ORDER — ONDANSETRON HCL 4 MG PO TABS
4.0000 mg | ORAL_TABLET | Freq: Four times a day (QID) | ORAL | Status: DC
  Administered 2014-06-27: 4 mg via ORAL
  Filled 2014-06-26 (×8): qty 1

## 2014-06-26 NOTE — BH Assessment (Signed)
Nash Mantis, AC at [facility], confirmed adult unit is currently at capacity. Contacted the following facilities for placement:   BED AVAILABLE, FAXED CLINICAL INFORMATION:  [place], per [facility], per [place], per [facility]   PT ALREADY UNDER REVIEW:  [place]:  [facility], per Wandra Feinstein, per [facility], per [facility], per [facility], per [facility], per [employer] , per [facility] per [facility], per [facility]   NO RESPONSE:  [place]   PT DECLINED:  [facility]   [place], [place], [facility] Triage Specialist [phone]

## 2014-06-26 NOTE — Progress Notes (Signed)
Patient ID: [name], male   DOB: January 13, 1961, 53 y.o.   MRN: [id number] [facility] INPATIENT:  Family/Significant Other Suicide Prevention Education  Suicide Prevention Education:  Patient Refusal for Family/Significant Other Suicide Prevention Education: The patient [name] has refused to provide written consent for family/significant other to be provided Family/Significant Other Suicide Prevention Education during admission and/or prior to discharge.  Physician notified.  Debbrah Alar 06/26/2014, 6:59 PM

## 2014-06-26 NOTE — ED Notes (Signed)
Report given to Maudie Mercury RN at [facility] in the observation unit. [place] notified of transportation need.
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
## 2014-06-26 NOTE — ED Notes (Signed)
Meal given

## 2014-06-26 NOTE — Progress Notes (Signed)
Patient ID: [name], male   DOB: May 15, 1961, 53 y.o.   MRN: [id number] Patient admitted to obs. Expressing SI with plan to walk in front of car. Denies HI . Positive for audio and visual hallucinations. States he is depressed and has been for sometime. Affect sad and depressed. Has substance abuse history.  Uses cocaine and ETOH. No complaints of withdrawal symptoms.Oriented to unit. Nutrition offered. Education provided regarding safety and falls. Safety checks started every 15 minutes.

## 2014-06-26 NOTE — ED Notes (Signed)
[name] here to transport to [facility]. Belongings bag x1 given to driver. Denies SI, HI and AVH. CIWA 5 given Ativan 1mg . CBG 293 given SSI 8units. Pain 5/10 given Advil 600mg  for overall body soreness. Ambulatory without difficulty.
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
## 2014-06-26 NOTE — Plan of Care (Signed)
[facility] Observation Crisis Plan  Reason for Crisis Plan:  Substance Abuse   Plan of Care:  Referral for Substance Abuse  Family Support:      Current Living Environment:  Living Arrangements: Alone  Insurance:   Hospital Account   Name Acct ID Class Status Primary Coverage   [name], [name] [id number] [place]        Guarantor Account (for Hospital Account [id number])   Name Relation to Pt Service Area Active? Acct Type   [name] Self [facility] Yes Behavioral Health   Address Phone       [place], [place] [address] [phone]          Coverage Information (for Hospital Account [id number])   1. [place]   F/O Payor/Plan Precert #   [facility] [place]ICAID/[place]    Subscriber Subscriber #   [name], [name] [id number]   Address Phone   [address] [place], [place] [address] [phone]       2. [place]   F/O Payor/Plan Precert #   [facility] [place]ICAID/[place]    Subscriber Subscriber #   [name], [name] [id number]   Address Phone   [address] [place], [place] [phone]          Legal Guardian:     Primary Care Provider:  No PCP Per Patient  Current Outpatient Providers:  [name]  Psychiatrist:     Counselor/Therapist:     Compliant with Medications:  Yes  Additional Information:   [name] 8/9/20157:23 PM

## 2014-06-26 NOTE — Progress Notes (Signed)
Patient ID: [name], male   DOB: [birth year], 53 y.o.   MRN: [id number]  D: Patient pleasant and cooperative with  Staff. No distress noted at this time. Pt denies SI or plans to harm himself. A: Q 15 minute safety checks. Administer meds as ordered. R: No complaints at this time.

## 2014-06-27 DIAGNOSIS — F1994 Other psychoactive substance use, unspecified with psychoactive substance-induced mood disorder: Secondary | ICD-10-CM

## 2014-06-27 DIAGNOSIS — F1020 Alcohol dependence, uncomplicated: Secondary | ICD-10-CM | POA: Diagnosis not present

## 2014-06-27 DIAGNOSIS — F1910 Other psychoactive substance abuse, uncomplicated: Secondary | ICD-10-CM

## 2014-06-27 DIAGNOSIS — F1010 Alcohol abuse, uncomplicated: Secondary | ICD-10-CM

## 2014-06-27 LAB — GLUCOSE, CAPILLARY
GLUCOSE-CAPILLARY: 260 mg/dL — AB (ref 70–99)
Glucose-Capillary: 286 mg/dL — ABNORMAL HIGH (ref 70–99)

## 2014-06-27 MED ORDER — TRAZODONE HCL 50 MG PO TABS: 50.0000 mg | ORAL_TABLET | Freq: Every evening | ORAL | Status: DC | PRN

## 2014-06-27 MED ORDER — METFORMIN HCL 850 MG PO TABS: 850.0000 mg | ORAL_TABLET | Freq: Two times a day (BID) | ORAL | Status: DC

## 2014-06-27 MED ORDER — ATENOLOL 50 MG PO TABS: 100.0000 mg | ORAL_TABLET | Freq: Every day | ORAL | Status: DC

## 2014-06-27 MED ORDER — IBUPROFEN 600 MG PO TABS
600.0000 mg | ORAL_TABLET | Freq: Four times a day (QID) | ORAL | Status: DC | PRN
  Administered 2014-06-27 – 2014-06-28 (×3): 600 mg via ORAL
  Filled 2014-06-27 (×2): qty 1

## 2014-06-27 MED ORDER — IBUPROFEN 600 MG PO TABS
ORAL_TABLET | ORAL | Status: AC
  Filled 2014-06-27: qty 1

## 2014-06-27 MED ORDER — AMLODIPINE BESYLATE 10 MG PO TABS: 10.0000 mg | ORAL_TABLET | Freq: Every day | ORAL | Status: DC

## 2014-06-27 MED ORDER — ONDANSETRON HCL 4 MG PO TABS: 4.0000 mg | ORAL_TABLET | Freq: Four times a day (QID) | ORAL | Status: DC

## 2014-06-27 MED ORDER — CLONIDINE HCL 0.2 MG PO TABS: 0.2000 mg | ORAL_TABLET | Freq: Two times a day (BID) | ORAL | Status: DC

## 2014-06-27 MED ORDER — METFORMIN HCL 850 MG PO TABS
850.0000 mg | ORAL_TABLET | Freq: Two times a day (BID) | ORAL | Status: DC
Start: 2014-06-27 — End: 2014-06-28
  Administered 2014-06-27 – 2014-06-28 (×2): 850 mg via ORAL
  Filled 2014-06-27 (×5): qty 1
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
## 2014-06-27 NOTE — Discharge Instructions (Signed)
To help you maintain a sober lifestyle, you are scheduled for a screening assessment at [facility] tomorrow, Tuesday, 06/28/2014 at 8:00 am. While screening does not guarantee that you will be admitted, you should be prepared to be admitted:  [facility]  [address] [place], [place] [address] [phone]   If you have a behavioral health crisis you have several options, all of which are available 24 hours a day, 7 days a week:   *Call your ACT Team crisis number at [phone] *Call 911  *Go to the [facility] at [name]. [place], [place]  *Go to your local hospital emergency department

## 2014-06-27 NOTE — Progress Notes (Signed)
Patient in bed watching TV at the begining of the shift. He denied SI/HI and denied Hallucinations. Writer offered patient snacks and beverage. Encouraged and supportive to patient. Q 15 minute check continues as ordered to maintain safety.
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
## 2014-06-27 NOTE — Plan of Care (Addendum)
[place] Observation Crisis Plan  Reason for Crisis Plan:  Substance Abuse   Plan of Care:  Referral for Substance Abuse  Family Support:    None; legally separated for the past 25 years with no intent to reconcile  Current Living Environment:  Living Arrangements: Alone; Homeless  Insurance:  [facility] Account   Name Acct ID Class Status Primary Coverage   [name], [name] [id number] [place] (for Hospital Account [id number])   Name Relation to [place]? Acct Type   [name] Self [facility] Yes Behavioral Health   Address Phone       [address], [place] [address] [phone])          Coverage Information (for Hospital Account [id number])   1. [place]   F/O Payor/Plan Precert #   [facility] [place]ICAID[place]    Subscriber Subscriber #   [name], [name] [place]:[id number] T   Address Phone   [address], [place] [address] [phone]       2. [place]   F/O Payor/Plan Precert #   [facility] [place]ID/[place]    Subscriber Subscriber #   [name], [name] [place]:[id number] T   Address Phone   [address], [place] [address] [phone]          Legal Guardian:   Self  Primary Care Provider:  No PCP Per Patient; Pt reports that he has one, but cannot name him  Current Outpatient Providers:  [name] ACT Team  Psychiatrist:    Beverly [name] ACT Team  Counselor/Therapist:    Beverly [name] ACT Team  Compliant with Medications:  No  Additional Information: After consulting with [name] Pizza, NP it has been determined that pt does not present a life threatening danger to himself or others, and that psychiatric hospitalization is not indicated for him at this time. He would, however, benefit from placement in a residential substance abuse  rehabilitation facility. He has an appointment for a screening assessment at [facility] tomorrow, Tuesday, 06/28/2014 at 08:00. This information will be included in his discharge instructions.  Pt has been provided with a [place] bus pass and $2.50 for PART bus fare.  [name] Mullet, MA  Triage Specialist  [name] 8/10/20154:29 PM  Addendum: As pt was being prepared for discharge, it was discovered that he intended to go directly to [facility] to stay the night, then go to [facility] in the morning.  I staffed this with [name] Pizza, NP.  He decided that it would be in the pt's best interest to remain in the Observation Unit overnight (06/27/2014 - 06/28/2014) and for [facility] to transport him [facility] tomorrow.  Pt returned bus fare and pass.
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
## 2014-06-27 NOTE — Progress Notes (Signed)
Patient ID: [name], male   DOB: Oct 06, 1961, 53 y.o.   MRN: [id number] Had planned for discharge this pm and to go to a screening appt tomorrow at [facility]. He stated he planned to go from here to [facility] to sleep. Discussed this with Heloise Purpura NP and he agreed to keep him here another night for his safety and send him via [facility] transport to [facility] in the am. He agreed to this plan, says he was afraid he would "mess up" but states he didn't mean he would be admitted to the hospital but rather would sleep in an area he knew of that he felt safe at.

## 2014-06-28 DIAGNOSIS — F1020 Alcohol dependence, uncomplicated: Secondary | ICD-10-CM | POA: Diagnosis not present

## 2014-06-28 NOTE — Progress Notes (Signed)
Patient ID: [name], male   DOB: 1961/02/18, 53 y.o.   MRN: [id number] Discharge Note-Arranged transport for him through [facility] for appt with [facility] for screening appt today.He is awake and ready to go. He is aware of this being his plan for today. Denies any thoughts to hurt self or others. He is seeking rehab services thru [facility].All property returned to him. He expressed his appreciation for the service he received here and verbalized no complaints on discharge.

## 2014-06-29 NOTE — H&P (Signed)
Patient admitted to [facility]. observation unit for alcohol detox and treatment

## 2014-06-29 NOTE — Discharge Summary (Signed)
Patient is back to baseline, denies any withdrawal symptoms. Patient also has no suicidal or homicidal ideation and is not psychotic. Patient can be discharged

## 2014-10-23 ENCOUNTER — Other Ambulatory Visit: Payer: Self-pay

## 2014-10-23 ENCOUNTER — Emergency Department (HOSPITAL_COMMUNITY)
Admission: EM | Admit: 2014-10-23 | Discharge: 2014-10-23 | Disposition: A | Discharge status: Other Acute Inpatient Hospital | Payer: Medicaid Other | Attending: Emergency Medicine | Admitting: Emergency Medicine

## 2014-10-23 ENCOUNTER — Encounter (HOSPITAL_COMMUNITY): Payer: Self-pay | Admitting: Emergency Medicine

## 2014-10-23 ENCOUNTER — Observation Stay (HOSPITAL_COMMUNITY)
Admission: AD | Admit: 2014-10-23 | Discharge: 2014-10-24 | Disposition: A | Discharge status: Home / Self Care | Payer: Medicaid Other | Source: Intra-hospital | Attending: Psychiatry | Admitting: Psychiatry

## 2014-10-23 DIAGNOSIS — Z9114 Patient's other noncompliance with medication regimen: Secondary | ICD-10-CM | POA: Insufficient documentation

## 2014-10-23 DIAGNOSIS — F1910 Other psychoactive substance abuse, uncomplicated: Secondary | ICD-10-CM

## 2014-10-23 DIAGNOSIS — F1020 Alcohol dependence, uncomplicated: Secondary | ICD-10-CM | POA: Diagnosis present

## 2014-10-23 DIAGNOSIS — I10 Essential (primary) hypertension: Secondary | ICD-10-CM | POA: Insufficient documentation

## 2014-10-23 DIAGNOSIS — Z720 Tobacco use: Secondary | ICD-10-CM | POA: Diagnosis not present

## 2014-10-23 DIAGNOSIS — M1990 Unspecified osteoarthritis, unspecified site: Secondary | ICD-10-CM | POA: Insufficient documentation

## 2014-10-23 DIAGNOSIS — Z79899 Other long term (current) drug therapy: Secondary | ICD-10-CM | POA: Insufficient documentation

## 2014-10-23 DIAGNOSIS — G479 Sleep disorder, unspecified: Secondary | ICD-10-CM | POA: Insufficient documentation

## 2014-10-23 DIAGNOSIS — E119 Type 2 diabetes mellitus without complications: Secondary | ICD-10-CM | POA: Insufficient documentation

## 2014-10-23 DIAGNOSIS — F1410 Cocaine abuse, uncomplicated: Secondary | ICD-10-CM | POA: Insufficient documentation

## 2014-10-23 DIAGNOSIS — Z888 Allergy status to other drugs, medicaments and biological substances status: Secondary | ICD-10-CM | POA: Insufficient documentation

## 2014-10-23 DIAGNOSIS — F1010 Alcohol abuse, uncomplicated: Secondary | ICD-10-CM

## 2014-10-23 DIAGNOSIS — F259 Schizoaffective disorder, unspecified: Secondary | ICD-10-CM

## 2014-10-23 DIAGNOSIS — Z8739 Personal history of other diseases of the musculoskeletal system and connective tissue: Secondary | ICD-10-CM | POA: Diagnosis not present

## 2014-10-23 DIAGNOSIS — F251 Schizoaffective disorder, depressive type: Secondary | ICD-10-CM | POA: Insufficient documentation

## 2014-10-23 DIAGNOSIS — M109 Gout, unspecified: Secondary | ICD-10-CM | POA: Insufficient documentation

## 2014-10-23 DIAGNOSIS — R4589 Other symptoms and signs involving emotional state: Secondary | ICD-10-CM

## 2014-10-23 DIAGNOSIS — R4689 Other symptoms and signs involving appearance and behavior: Secondary | ICD-10-CM

## 2014-10-23 DIAGNOSIS — F1420 Cocaine dependence, uncomplicated: Secondary | ICD-10-CM | POA: Insufficient documentation

## 2014-10-23 DIAGNOSIS — F1210 Cannabis abuse, uncomplicated: Secondary | ICD-10-CM | POA: Insufficient documentation

## 2014-10-23 DIAGNOSIS — F329 Major depressive disorder, single episode, unspecified: Secondary | ICD-10-CM | POA: Insufficient documentation

## 2014-10-23 DIAGNOSIS — F1994 Other psychoactive substance use, unspecified with psychoactive substance-induced mood disorder: Secondary | ICD-10-CM

## 2014-10-23 LAB — CBG MONITORING, ED
Glucose-Capillary: 324 mg/dL — ABNORMAL HIGH (ref 70–99)
Glucose-Capillary: 313 mg/dL — ABNORMAL HIGH (ref 70–99)
Glucose-Capillary: 252 mg/dL — ABNORMAL HIGH (ref 70–99)
Glucose-Capillary: 263 mg/dL — ABNORMAL HIGH (ref 70–99)

## 2014-10-23 LAB — URINALYSIS, ROUTINE W REFLEX MICROSCOPIC
Bilirubin Urine: NEGATIVE
HGB URINE DIPSTICK: NEGATIVE
Ketones, ur: NEGATIVE mg/dL
Leukocytes, UA: NEGATIVE
Nitrite: NEGATIVE
PH: 5 (ref 5.0–8.0)
Protein, ur: 100 mg/dL — AB
Specific Gravity, Urine: 1.037 — ABNORMAL HIGH (ref 1.005–1.030)
Urobilinogen, UA: 0.2 mg/dL (ref 0.0–1.0)

## 2014-10-23 LAB — COMPREHENSIVE METABOLIC PANEL
ALBUMIN: 3.8 g/dL (ref 3.5–5.2)
ALT: 35 U/L (ref 0–53)
AST: 70 U/L — AB (ref 0–37)
Alkaline Phosphatase: 97 U/L (ref 39–117)
Anion gap: 20 — ABNORMAL HIGH (ref 5–15)
BILIRUBIN TOTAL: 0.2 mg/dL — AB (ref 0.3–1.2)
BUN: 12 mg/dL (ref 6–23)
CHLORIDE: 95 meq/L — AB (ref 96–112)
CO2: 23 meq/L (ref 19–32)
CREATININE: 0.88 mg/dL (ref 0.50–1.35)
Calcium: 9.7 mg/dL (ref 8.4–10.5)
GFR calc Af Amer: >90 mL/min (ref >90)
GFR calc non Af Amer: >90 mL/min (ref >90)
Glucose, Bld: 408 mg/dL — ABNORMAL HIGH (ref 70–99)
Potassium: 4.1 mEq/L (ref 3.7–5.3)
Sodium: 138 mEq/L (ref 137–147)
Total Protein: 8.5 g/dL — ABNORMAL HIGH (ref 6.0–8.3)

## 2014-10-23 LAB — RAPID URINE DRUG SCREEN, HOSP PERFORMED
AMPHETAMINES: NOT DETECTED
BARBITURATES: NOT DETECTED
BENZODIAZEPINES: NOT DETECTED
COCAINE: POSITIVE — AB
Opiates: NOT DETECTED
Tetrahydrocannabinol: NOT DETECTED

## 2014-10-23 LAB — ACETAMINOPHEN LEVEL: Acetaminophen (Tylenol), Serum: <15 ug/mL (ref 10–30)

## 2014-10-23 LAB — I-STAT TROPONIN, ED: TROPONIN I, POC: 0 ng/mL (ref 0.00–0.08)

## 2014-10-23 LAB — GLUCOSE, CAPILLARY: Glucose-Capillary: 292 mg/dL — ABNORMAL HIGH (ref 70–99)

## 2014-10-23 LAB — CBC
HCT: 41.8 % (ref 39.0–52.0)
Hemoglobin: 13.6 g/dL (ref 13.0–17.0)
MCH: 28.6 pg (ref 26.0–34.0)
MCHC: 32.5 g/dL (ref 30.0–36.0)
MCV: 87.8 fL (ref 78.0–100.0)
Platelets: 165 10*3/uL (ref 150–400)
RBC: 4.76 MIL/uL (ref 4.22–5.81)
RDW: 12.7 % (ref 11.5–15.5)
WBC: 6.5 10*3/uL (ref 4.0–10.5)

## 2014-10-23 LAB — SALICYLATE LEVEL: Salicylate Lvl: <2 mg/dL — ABNORMAL LOW (ref 2.8–20.0)

## 2014-10-23 LAB — ETHANOL: ALCOHOL ETHYL (B): 125 mg/dL — AB (ref 0–11)

## 2014-10-23 LAB — URINE MICROSCOPIC-ADD ON

## 2014-10-23 MED ORDER — NICOTINE 21 MG/24HR TD PT24: 21.0000 mg | MEDICATED_PATCH | Freq: Every day | TRANSDERMAL | Status: DC

## 2014-10-23 MED ORDER — ATENOLOL 100 MG PO TABS: 100.0000 mg | ORAL_TABLET | Freq: Every day | ORAL | Status: DC

## 2014-10-23 MED ORDER — CLONIDINE HCL 0.1 MG PO TABS
0.2000 mg | ORAL_TABLET | Freq: Once | ORAL | Status: AC
  Administered 2014-10-23: 0.2 mg via ORAL
  Filled 2014-10-23 (×2): qty 2

## 2014-10-23 MED ORDER — ACETAMINOPHEN 325 MG PO TABS
650.0000 mg | ORAL_TABLET | Freq: Four times a day (QID) | ORAL | Status: DC | PRN
  Administered 2014-10-24: 650 mg via ORAL
  Filled 2014-10-23: qty 2

## 2014-10-23 MED ORDER — IBUPROFEN 200 MG PO TABS
600.0000 mg | ORAL_TABLET | Freq: Three times a day (TID) | ORAL | Status: DC | PRN
  Administered 2014-10-23: 600 mg via ORAL
  Filled 2014-10-23: qty 3

## 2014-10-23 MED ORDER — AMLODIPINE BESYLATE 10 MG PO TABS
10.0000 mg | ORAL_TABLET | Freq: Every day | ORAL | Status: DC
  Administered 2014-10-23: 10 mg via ORAL
  Filled 2014-10-23: qty 1

## 2014-10-23 MED ORDER — TRAZODONE HCL 100 MG PO TABS
100.0000 mg | ORAL_TABLET | Freq: Every evening | ORAL | Status: DC | PRN
Start: 2014-10-23 — End: 2014-10-25
  Administered 2014-10-23: 100 mg via ORAL
  Filled 2014-10-23: qty 1
  Filled 2014-10-23: qty 14

## 2014-10-23 MED ORDER — METFORMIN HCL 850 MG PO TABS
850.0000 mg | ORAL_TABLET | Freq: Two times a day (BID) | ORAL | Status: DC
  Administered 2014-10-23 (×2): 850 mg via ORAL
  Filled 2014-10-23 (×4): qty 1

## 2014-10-23 MED ORDER — ALUM & MAG HYDROXIDE-SIMETH 200-200-20 MG/5ML PO SUSP
30.0000 mL | ORAL | Status: DC | PRN
Start: 2014-10-23 — End: 2014-10-25

## 2014-10-23 MED ORDER — CLONIDINE HCL 0.1 MG PO TABS
0.2000 mg | ORAL_TABLET | Freq: Two times a day (BID) | ORAL | Status: DC
  Administered 2014-10-23: 0.2 mg via ORAL
  Filled 2014-10-23: qty 2

## 2014-10-23 MED ORDER — LORAZEPAM 1 MG PO TABS
1.0000 mg | ORAL_TABLET | Freq: Three times a day (TID) | ORAL | Status: DC | PRN
  Administered 2014-10-23: 1 mg via ORAL
  Filled 2014-10-23: qty 1

## 2014-10-23 MED ORDER — ZOLPIDEM TARTRATE 5 MG PO TABS: 5.0000 mg | ORAL_TABLET | Freq: Every evening | ORAL | Status: DC | PRN

## 2014-10-23 MED ORDER — MAGNESIUM HYDROXIDE 400 MG/5ML PO SUSP: 30.0000 mL | Freq: Every day | ORAL | Status: DC | PRN

## 2014-10-23 MED ORDER — TRAZODONE HCL 50 MG PO TABS
50.0000 mg | ORAL_TABLET | Freq: Every evening | ORAL | Status: DC | PRN
  Filled 2014-10-23: qty 1

## 2014-10-23 NOTE — ED Notes (Signed)
Dr Harrington Challenger and shuvon np into see

## 2014-10-23 NOTE — ED Notes (Signed)
Pt. Transferred to [facility] OBS with [facility].

## 2014-10-23 NOTE — Progress Notes (Signed)
Patient ID: [name], male   DOB: August 15, 1961, 53 y.o.   MRN: [id number]  Patient arrived to unit asking to be admitted for SI and detox. Pt states "My life is a mess and I need to get straight. I need somewhere to stay. I have been off my meds cause I'm too high all the time to even show up to get them". Pt verbally contracts for safety while on the unit. No s/s of distress noted on admission.

## 2014-10-23 NOTE — ED Notes (Addendum)
Pt arrived via EMS with a need for detox off of cocaine. Pt states he has attempted to commit suicide by taking too many pills but it hasn't worked.  Pt states that he thinks of suicide often.  Pt states he is tired of doing drugs.  Pt states that he needs to be" locked up' to be able to go to rehab. Pt is also complaining of head and chest pain.  Pt states he has done drugs all day.  Pt is also a diabetic and his CBG according to EMS was 386

## 2014-10-23 NOTE — ED Notes (Signed)
CSW in w/ pt
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
## 2014-10-23 NOTE — ED Notes (Signed)
Ambulatory w/o difficulty to room 37

## 2014-10-23 NOTE — Consult Note (Signed)
[facility] Face-to-Face Psychiatry Consult   Reason for Consult:  Polysubstance abuse Referring Physician:  EDP  Yann Biehn is an 53 y.o. male. Total Time spent with patient: 45 minutes  Assessment: AXIS I:  Alcohol Abuse, Depressive Disorder NOS, Substance Abuse and Substance Induced Mood Disorder AXIS II:  Deferred AXIS III:   Past Medical History  Diagnosis Date  . Drug abuse   . Alcohol abuse   . Diabetes mellitus   . Uncontrolled hypertension   . Arthritis   . Gout   . DTs (delirium tremens)     history of   . Crack cocaine use   . Noncompliance with medication regimen    AXIS IV:  other psychosocial or environmental problems and problems related to social environment AXIS V:  41-50 serious symptoms  Plan:  Observation and re-evaluation  Subjective:   Bram Hottel is a 53 y.o. male patient presents to [facility] with complaints of polysubstance abuse and requesting detox.  HPI:  Patient states that he drinks 12 beers daily and "I use cocaine, marijuana, alcohol and anything I can get.  I'm tire and I got to get some help"  Patient denies suicidal/homicidal ideation, psychosis (occurs only with drug use), and paranoia (only occurs when high on drugs).  Patient states "I really need some help" Review of Systems  Neurological: Positive for tremors. Negative for seizures and loss of consciousness.  Psychiatric/Behavioral: Positive for depression and substance abuse. Negative for hallucinations. The patient is not nervous/anxious and does not have insomnia.   All other systems reviewed and are negative.  Family History  Problem Relation Age of Onset  . Hypertension       HPI Elements:   Location:  Polysubstance abuse. Quality:  Depression. Severity:  Depression. Timing:  several days.  Past Psychiatric History: Past Medical History  Diagnosis Date  . Drug abuse   . Alcohol abuse   . Diabetes mellitus   . Uncontrolled hypertension   . Arthritis   . Gout    . DTs (delirium tremens)     history of   . Crack cocaine use   . Noncompliance with medication regimen     reports that he has been smoking Cigarettes.  He has been smoking about 0.25 packs per day. He has never used smokeless tobacco. He reports that he drinks about 7.2 oz of alcohol per week. He reports that he uses illicit drugs (Cocaine). Family History  Problem Relation Age of Onset  . Hypertension     Family History Substance Abuse: No Family Supports:  ("I have no support") Living Arrangements: Alone Can pt return to current living arrangement?: Yes Abuse/Neglect [facility]) Physical Abuse: Denies Verbal Abuse: Denies Sexual Abuse: Denies Allergies:   Allergies  Allergen Reactions  . Invega [Paliperidone] Shortness Of Breath    Vision changes    ACT Assessment Complete:  Yes:    Educational Status    Risk to Self: Risk to self with the past 6 months Suicidal Ideation: Yes-Currently Present Suicidal Intent: Yes-Currently Present Is patient at risk for suicide?: Yes Suicidal Plan?: Yes-Currently Present Specify Current Suicidal Plan: "Overdose on drugs or set myself on fire"  ("I d) Access to Means: Yes Specify Access to Suicidal Means: Pt has access to drugs. What has been your use of drugs/alcohol within the last 12 months?: Daily drug use reported.  Previous Attempts/Gestures: Yes How many times?: 3 Other Self Harm Risks: No other self harm reported.  Triggers for Past Attempts: Unpredictable Intentional Self  Injurious Behavior: None Family Suicide History: No Recent stressful life event(s): Other (Comment) ("heartbroken") Persecutory voices/beliefs?: No Depression: Yes Depression Symptoms: Despondent, Insomnia, Tearfulness, Isolating, Fatigue, Guilt, Loss of interest in usual pleasures, Feeling worthless/self pity, Feeling angry/irritable Substance abuse history and/or treatment for substance abuse?: Yes Suicide prevention information given to non-admitted  patients: Not applicable  Risk to Others: Risk to Others within the past 6 months Homicidal Ideation: No Thoughts of Harm to Others: No Current Homicidal Intent: No Current Homicidal Plan: No Access to Homicidal Means: No Identified Victim: NA History of harm to others?: No Assessment of Violence: None Noted Violent Behavior Description: No violent behaviors reported. Pt is calm and cooperative at this time.  Does patient have access to weapons?: No Criminal Charges Pending?: No Does patient have a court date: No  Abuse: Abuse/Neglect Assessment (Assessment to be complete while patient is alone) Physical Abuse: Denies Verbal Abuse: Denies Sexual Abuse: Denies Exploitation of patient/patient's resources: Denies Self-Neglect: Denies  Prior Inpatient Therapy: Prior Inpatient Therapy Prior Inpatient Therapy: Yes Prior Therapy Dates: 2015 Prior Therapy Facilty/Provider(s): [facility]  Reason for Treatment: Depression,   Prior Outpatient Therapy: Prior Outpatient Therapy Prior Outpatient Therapy: Yes Prior Therapy Dates: 2015 Prior Therapy Facilty/Provider(s): [name]  Reason for Treatment: Schizophrenic, Bipolar   Additional Information: Additional Information 1:1 In Past 12 Months?: No CIRT Risk: No Elopement Risk: No    Objective: Blood pressure 165/98, pulse 90, temperature 98 F (36.7 C), temperature source Oral, resp. rate 20, height _0  (1.803 m), weight 85.73 kg (189 lb), SpO2 99 %.Body mass index is 26.37 kg/(m^2). Results for orders placed or performed during the hospital encounter of 10/23/14 (from the past 72 hour(s))  Acetaminophen level     Status: None   Collection Time: 10/23/14  2:20 AM  Result Value Ref Range   Acetaminophen (Tylenol), Serum <15.0 10 - 30 ug/mL    Comment:        THERAPEUTIC CONCENTRATIONS VARY SIGNIFICANTLY. A RANGE OF 10-30 ug/mL MAY BE AN EFFECTIVE CONCENTRATION FOR MANY PATIENTS. HOWEVER, SOME ARE BEST TREATED AT CONCENTRATIONS OUTSIDE  THIS RANGE. ACETAMINOPHEN CONCENTRATIONS >150 ug/mL AT 4 HOURS AFTER INGESTION AND >50 ug/mL AT 12 HOURS AFTER INGESTION ARE OFTEN ASSOCIATED WITH TOXIC REACTIONS.   CBC     Status: None   Collection Time: 10/23/14  2:20 AM  Result Value Ref Range   WBC 6.5 4.0 - 10.5 K/uL   RBC 4.76 4.22 - 5.81 MIL/uL   Hemoglobin 13.6 13.0 - 17.0 g/dL   HCT 41.8 39.0 - 52.0 %   MCV 87.8 78.0 - 100.0 fL   MCH 28.6 26.0 - 34.0 pg   MCHC 32.5 30.0 - 36.0 g/dL   RDW 12.7 11.5 - 15.5 %   Platelets 165 150 - 400 K/uL  Comprehensive metabolic panel     Status: Abnormal   Collection Time: 10/23/14  2:20 AM  Result Value Ref Range   Sodium 138 137 - 147 mEq/L   Potassium 4.1 3.7 - 5.3 mEq/L   Chloride 95 (L) 96 - 112 mEq/L   CO2 23 19 - 32 mEq/L   Glucose, Bld 408 (H) 70 - 99 mg/dL   BUN 12 6 - 23 mg/dL   Creatinine, Ser 0.88 0.50 - 1.35 mg/dL   Calcium 9.7 8.4 - 10.5 mg/dL   Total Protein 8.5 (H) 6.0 - 8.3 g/dL   Albumin 3.8 3.5 - 5.2 g/dL   AST 70 (H) 0 - 37 U/L   ALT 35  0 - 53 U/L   Alkaline Phosphatase 97 39 - 117 U/L   Total Bilirubin 0.2 (L) 0.3 - 1.2 mg/dL   GFR calc non Af Amer >90 >90 mL/min   GFR calc Af Amer >90 >90 mL/min    Comment: (NOTE) The eGFR has been calculated using the CKD EPI equation. This calculation has not been validated in all clinical situations. eGFR's persistently <90 mL/min signify possible Chronic Kidney Disease.    Anion gap 20 (H) 5 - 15  Ethanol (ETOH)     Status: Abnormal   Collection Time: 10/23/14  2:20 AM  Result Value Ref Range   Alcohol, Ethyl (B) 125 (H) 0 - 11 mg/dL    Comment:        LOWEST DETECTABLE LIMIT FOR SERUM ALCOHOL IS 11 mg/dL FOR MEDICAL PURPOSES ONLY   Salicylate level     Status: Abnormal   Collection Time: 10/23/14  2:20 AM  Result Value Ref Range   Salicylate Lvl <3.7 (L) 2.8 - 20.0 mg/dL  I-stat troponin, ED (not at [facility])     Status: None   Collection Time: 10/23/14  2:28 AM  Result Value Ref Range   Troponin i, poc  0.00 0.00 - 0.08 ng/mL   Comment 3            Comment: Due to the release kinetics of cTnI, a negative result within the first hours of the onset of symptoms does not rule out myocardial infarction with certainty. If myocardial infarction is still suspected, repeat the test at appropriate intervals.   CBG monitoring, ED     Status: Abnormal   Collection Time: 10/23/14  2:39 AM  Result Value Ref Range   Glucose-Capillary 324 (H) 70 - 99 mg/dL   Comment 1 Documented in Chart    Comment 2 Notify RN   Urine Drug Screen     Status: Abnormal   Collection Time: 10/23/14  6:33 AM  Result Value Ref Range   Opiates NONE DETECTED NONE DETECTED   Cocaine POSITIVE (A) NONE DETECTED   Benzodiazepines NONE DETECTED NONE DETECTED   Amphetamines NONE DETECTED NONE DETECTED   Tetrahydrocannabinol NONE DETECTED NONE DETECTED   Barbiturates NONE DETECTED NONE DETECTED    Comment:        DRUG SCREEN FOR MEDICAL PURPOSES ONLY.  IF CONFIRMATION IS NEEDED FOR ANY PURPOSE, NOTIFY LAB WITHIN 5 DAYS.        LOWEST DETECTABLE LIMITS FOR URINE DRUG SCREEN Drug Class       Cutoff (ng/mL) Amphetamine      1000 Barbiturate      200 Benzodiazepine   169 Tricyclics       678 Opiates          300 Cocaine          300 THC              50   Urinalysis, Routine w reflex microscopic     Status: Abnormal   Collection Time: 10/23/14  6:33 AM  Result Value Ref Range   Color, Urine YELLOW YELLOW   APPearance CLEAR CLEAR   Specific Gravity, Urine 1.037 (H) 1.005 - 1.030   pH 5.0 5.0 - 8.0   Glucose, UA >1000 (A) NEGATIVE mg/dL   Hgb urine dipstick NEGATIVE NEGATIVE   Bilirubin Urine NEGATIVE NEGATIVE   Ketones, ur NEGATIVE NEGATIVE mg/dL   Protein, ur 100 (A) NEGATIVE mg/dL   Urobilinogen, UA 0.2 0.0 - 1.0 mg/dL   Nitrite  NEGATIVE NEGATIVE   Leukocytes, UA NEGATIVE NEGATIVE  Urine microscopic-add on     Status: None   Collection Time: 10/23/14  6:33 AM  Result Value Ref Range   RBC / HPF 0-2 <3  RBC/hpf   Bacteria, UA RARE RARE  POC CBG, ED     Status: Abnormal   Collection Time: 10/23/14  8:24 AM  Result Value Ref Range   Glucose-Capillary 313 (H) 70 - 99 mg/dL  CBG monitoring, ED     Status: Abnormal   Collection Time: 10/23/14 11:59 AM  Result Value Ref Range   Glucose-Capillary 252 (H) 70 - 99 mg/dL   Labs are reviewed see values above. Medications reviewed.  Current Facility-Administered Medications  Medication Dose Route Frequency Provider Last Rate Last Dose  . amLODipine (NORVASC) tablet 10 mg  10 mg Oral Daily Carrie Mew, PA-C   10 mg at 10/23/14 1025  . cloNIDine (CATAPRES) tablet 0.2 mg  0.2 mg Oral BID Carrie Mew, PA-C   0.2 mg at 10/23/14 1024  . ibuprofen (ADVIL,MOTRIN) tablet 600 mg  600 mg Oral Q8H PRN Garald Balding, NP   600 mg at 10/23/14 0707  . LORazepam (ATIVAN) tablet 1 mg  1 mg Oral Q8H PRN Garald Balding, NP   1 mg at 10/23/14 0443  . metFORMIN (GLUCOPHAGE) tablet 850 mg  850 mg Oral BID WC Carrie Mew, PA-C   850 mg at 10/23/14 7026  . nicotine (NICODERM CQ - dosed in mg/24 hours) patch 21 mg  21 mg Transdermal Daily Garald Balding, NP   21 mg at 10/23/14 1028  . traZODone (DESYREL) tablet 50 mg  50 mg Oral QHS PRN Carrie Mew, PA-C      . zolpidem [facility]) tablet 5 mg  5 mg Oral QHS PRN Garald Balding, NP       Current Outpatient Prescriptions  Medication Sig Dispense Refill  . amLODipine (NORVASC) 10 MG tablet Take 1 tablet (10 mg total) by mouth daily. 30 tablet 0  . atenolol (TENORMIN) 50 MG tablet Take 2 tablets (100 mg total) by mouth daily.    . cloNIDine (CATAPRES) 0.2 MG tablet Take 1 tablet (0.2 mg total) by mouth 2 (two) times daily.    . metFORMIN (GLUCOPHAGE) 850 MG tablet Take 1 tablet (850 mg total) by mouth 2 (two) times daily with a meal. 60 tablet 0  . ondansetron (ZOFRAN) 4 MG tablet Take 1 tablet (4 mg total) by mouth every 6 (six) hours. 12 tablet 0  . traZODone (DESYREL) 50 MG tablet Take 1 tablet (50 mg total) by mouth  at bedtime as needed for sleep. 7 tablet 0    Psychiatric Specialty Exam:     Blood pressure 165/98, pulse 90, temperature 98 F (36.7 C), temperature source Oral, resp. rate 20, height _0  (1.803 m), weight 85.73 kg (189 lb), SpO2 99 %.Body mass index is 26.37 kg/(m^2).  General Appearance: Casual and Disheveled  Eye Contact::  Good  Speech:  Clear and Coherent and Normal Rate  Volume:  Normal  Mood:  Anxious and Depressed  Affect:  Congruent and Depressed  Thought Process:  Circumstantial and Goal Directed  Orientation:  Full (Time, Place, and Person)  Thought Content:  Rumination  Suicidal Thoughts:  No  Homicidal Thoughts:  No  Memory:  Immediate;   Good Recent;   Good Remote;   Good  Judgement:  Fair  Insight:  Lacking  Psychomotor Activity:  Tremor  Concentration:  Fair  Recall:  Good  Fund of Knowledge:Fair  Language: Good  Akathisia:  No  Handed:  Right  AIMS (if indicated):     Assets:  Communication Skills Desire for Improvement Housing Social Support  Sleep:      Musculoskeletal: Strength & Muscle Tone: within normal limits Gait & Station: normal Patient leans: N/A  Treatment Plan Summary: 24 hour observation.  Re-eval tomorrow for discharge with Substance abuse resources  Earleen Newport, FNP-BC 10/23/2014 2:44 PM  Patient seen and I agree with treatment and plan Levonne Spiller MD

## 2014-10-23 NOTE — ED Provider Notes (Signed)
CSN: [id number]     Arrival date & time 10/23/14  0146 History   First MD Initiated Contact with Patient 10/23/14 0316     Chief Complaint  Patient presents with  . Addiction Problem  . Suicidal     (Consider location/radiation/quality/duration/timing/severity/associated sxs/prior Treatment) HPI Comments: This is a 53 year old male with 30 plus year history of polysubstance abuse, states he recently lost his cousin and is been very depressed.  He would like help with his substance abuse.  He also has a history of suicide attempts, last being 3 years ago when he overdosed on pills.  Doesn't have any specific plan.  Tonight  The history is provided by the patient.    Past Medical History  Diagnosis Date  . Drug abuse   . Alcohol abuse   . Diabetes mellitus   . Uncontrolled hypertension   . Arthritis   . Gout   . DTs (delirium tremens)     history of   . Crack cocaine use   . Noncompliance with medication regimen    Past Surgical History  Procedure Laterality Date  . Mandible reconstruction     Family History  Problem Relation Age of Onset  . Hypertension     History  Substance Use Topics  . Smoking status: Current Every Day Smoker -- 0.25 packs/day    Types: Cigarettes  . Smokeless tobacco: Never Used  . Alcohol Use: 7.2 oz/week    12 Cans of beer per week    Review of Systems  Constitutional: Negative for fever.  Respiratory: Negative for shortness of breath.   Cardiovascular: Negative for chest pain.  Gastrointestinal: Negative for abdominal pain.  Neurological: Negative for dizziness and headaches.  Psychiatric/Behavioral: Positive for suicidal ideas and sleep disturbance.      Allergies  Invega  Home Medications   Prior to Admission medications   Medication Sig Start Date End Date Taking? Authorizing Provider  amLODipine (NORVASC) 10 MG tablet Take 1 tablet (10 mg total) by mouth daily. 06/27/14  Yes [name], FNP  atenolol (TENORMIN) 50 MG  tablet Take 2 tablets (100 mg total) by mouth daily. 06/27/14  Yes [name], FNP  cloNIDine (CATAPRES) 0.2 MG tablet Take 1 tablet (0.2 mg total) by mouth 2 (two) times daily. 06/27/14  Yes [name], FNP  metFORMIN (GLUCOPHAGE) 850 MG tablet Take 1 tablet (850 mg total) by mouth 2 (two) times daily with a meal. 06/27/14  Yes [name], FNP  ondansetron (ZOFRAN) 4 MG tablet Take 1 tablet (4 mg total) by mouth every 6 (six) hours. 06/27/14  Yes [name], FNP  traZODone (DESYREL) 50 MG tablet Take 1 tablet (50 mg total) by mouth at bedtime as needed for sleep. 06/27/14  Yes John C Withrow, FNP   BP 161/107 mmHg  Pulse 90  Temp(Src) 97.5 F (36.4 C) (Oral)  Resp 18  Ht 5\' 11"  (1.803 m)  Wt 189 lb (85.73 kg)  BMI 26.37 kg/m2  SpO2 97% Physical Exam  Constitutional: He is oriented to person, place, and time. He appears well-nourished.  HENT:  Head: Normocephalic.  Eyes: Pupils are equal, round, and reactive to light.  Neck: Normal range of motion.  Cardiovascular: Normal rate and regular rhythm.   Pulmonary/Chest: Effort normal and breath sounds normal.  Musculoskeletal: Normal range of motion.  Neurological: He is alert and oriented to person, place, and time.  Skin: Skin is warm.  Psychiatric: His speech is slurred. He is  slowed. Cognition and memory are impaired. He expresses impulsivity and inappropriate judgment. He exhibits a depressed mood. He expresses suicidal ideation. He expresses no suicidal plans.  Nursing note and vitals reviewed.   ED Course  Procedures (including critical care time) Labs Review Labs Reviewed  COMPREHENSIVE METABOLIC PANEL - Abnormal; Notable for the following:    Chloride 95 (*)    Glucose, Bld 408 (*)    Total Protein 8.5 (*)    AST 70 (*)    Total Bilirubin 0.2 (*)    Anion gap 20 (*)    All other components within normal limits  ETHANOL - Abnormal; Notable for the following:    Alcohol, Ethyl (B) 125 (*)    All other  components within normal limits  SALICYLATE LEVEL - Abnormal; Notable for the following:    Salicylate Lvl 123456 (*)    All other components within normal limits  CBG MONITORING, ED - Abnormal; Notable for the following:    Glucose-Capillary 324 (*)    All other components within normal limits  ACETAMINOPHEN LEVEL  CBC  URINE RAPID DRUG SCREEN (HOSP PERFORMED)  URINALYSIS, ROUTINE W REFLEX MICROSCOPIC  I-STAT TROPOININ, ED    Imaging Review No results found.   EKG Interpretation None      MDM  Accepted by behavioral health Final diagnoses:  Polysubstance abuse  Suicidal behavior         Garald Balding, NP 10/23/14 [id number]  Wynetta Fines, MD 10/23/14 [phone]

## 2014-10-23 NOTE — Progress Notes (Signed)
[facility] INPATIENT:  Family/Significant Other Suicide Prevention Education  Suicide Prevention Education:  Patient Refusal for Family/Significant Other Suicide Prevention Education: The patient [name] has refused to provide written consent for family/significant other to be provided Family/Significant Other Suicide Prevention Education during admission and/or prior to discharge.  Physician notified.  Romie Minus 10/23/2014, 9:46 PM

## 2014-10-23 NOTE — BH Assessment (Signed)
Assessment completed. Consulted Darlyne Russian, PA-C who recommended inpatient treatment. TTS will contact other facilities for placement. Manuela Neptune, NP has been informed of the recommendation.

## 2014-10-23 NOTE — ED Notes (Signed)
Report received from Bernice. Pt. Alert and oriented in no distress denies SI, HI, AVH and pain. Will continue to monitor for safety. Pt. Instructed to come to me with problems or concerns. Q 15 minute checks continue.

## 2014-10-23 NOTE — ED Provider Notes (Signed)
Pt accepted to [facility] by Dr. Dwyane Dee.   Pamella Pert, MD 10/23/14 [phone]

## 2014-10-24 DIAGNOSIS — F1020 Alcohol dependence, uncomplicated: Secondary | ICD-10-CM | POA: Diagnosis not present

## 2014-10-24 DIAGNOSIS — F1010 Alcohol abuse, uncomplicated: Secondary | ICD-10-CM

## 2014-10-24 DIAGNOSIS — F329 Major depressive disorder, single episode, unspecified: Secondary | ICD-10-CM

## 2014-10-24 DIAGNOSIS — F1994 Other psychoactive substance use, unspecified with psychoactive substance-induced mood disorder: Secondary | ICD-10-CM

## 2014-10-24 LAB — GLUCOSE, CAPILLARY
GLUCOSE-CAPILLARY: 267 mg/dL — AB (ref 70–99)
GLUCOSE-CAPILLARY: 289 mg/dL — AB (ref 70–99)
GLUCOSE-CAPILLARY: 314 mg/dL — AB (ref 70–99)
Glucose-Capillary: 249 mg/dL — ABNORMAL HIGH (ref 70–99)
Glucose-Capillary: 171 mg/dL — ABNORMAL HIGH (ref 70–99)

## 2014-10-24 MED ORDER — METFORMIN HCL 850 MG PO TABS: 850.0000 mg | ORAL_TABLET | Freq: Two times a day (BID) | ORAL | Status: DC

## 2014-10-24 MED ORDER — CLONIDINE HCL 0.2 MG PO TABS
0.2000 mg | ORAL_TABLET | Freq: Two times a day (BID) | ORAL | Status: DC
  Administered 2014-10-24 (×2): 0.2 mg via ORAL
  Filled 2014-10-24: qty 1
  Filled 2014-10-24: qty 14
  Filled 2014-10-24 (×2): qty 1
  Filled 2014-10-24 (×3): qty 2
  Filled 2014-10-24: qty 14

## 2014-10-24 MED ORDER — LABETALOL HCL 100 MG PO TABS
100.0000 mg | ORAL_TABLET | Freq: Once | ORAL | Status: AC
  Administered 2014-10-24: 100 mg via ORAL

## 2014-10-24 MED ORDER — CLONIDINE HCL 0.2 MG PO TABS
0.2000 mg | ORAL_TABLET | Freq: Once | ORAL | Status: AC
  Administered 2014-10-24: 0.2 mg via ORAL

## 2014-10-24 MED ORDER — INSULIN ASPART 100 UNIT/ML ~~LOC~~ SOLN
8.0000 [IU] | SUBCUTANEOUS | Status: AC
  Administered 2014-10-24: 8 [IU] via SUBCUTANEOUS

## 2014-10-24 MED ORDER — INSULIN ASPART 100 UNIT/ML ~~LOC~~ SOLN
5.0000 [IU] | Freq: Once | SUBCUTANEOUS | Status: AC
Start: 2014-10-24 — End: 2014-10-24
  Administered 2014-10-24: 5 [IU] via SUBCUTANEOUS

## 2014-10-24 MED ORDER — CLONIDINE HCL 0.2 MG PO TABS: 0.2000 mg | ORAL_TABLET | Freq: Two times a day (BID) | ORAL | Status: DC

## 2014-10-24 MED ORDER — ATENOLOL 50 MG PO TABS
100.0000 mg | ORAL_TABLET | Freq: Every day | ORAL | Status: DC
  Administered 2014-10-24: 100 mg via ORAL
  Filled 2014-10-24: qty 1
  Filled 2014-10-24: qty 4
  Filled 2014-10-24: qty 1
  Filled 2014-10-24: qty 14

## 2014-10-24 MED ORDER — CLONIDINE HCL 0.2 MG PO TABS
0.2000 mg | ORAL_TABLET | ORAL | Status: AC
  Administered 2014-10-24: 0.2 mg via ORAL
  Filled 2014-10-24: qty 1

## 2014-10-24 MED ORDER — ATENOLOL 50 MG PO TABS: 100.0000 mg | ORAL_TABLET | Freq: Every day | ORAL | Status: DC

## 2014-10-24 MED ORDER — CLONIDINE HCL 0.1 MG PO TABS
ORAL_TABLET | ORAL | Status: AC
  Filled 2014-10-24: qty 2

## 2014-10-24 MED ORDER — METFORMIN HCL 850 MG PO TABS
850.0000 mg | ORAL_TABLET | Freq: Two times a day (BID) | ORAL | Status: DC
  Administered 2014-10-24 (×2): 850 mg via ORAL
  Filled 2014-10-24: qty 1
  Filled 2014-10-24: qty 14
  Filled 2014-10-24: qty 1
  Filled 2014-10-24: qty 14
  Filled 2014-10-24 (×3): qty 1

## 2014-10-24 NOTE — Progress Notes (Addendum)
0730  Patient sitting up in bed this morning, eating breakfast.  Presently patient resting in bed with eyes closed.  Respirations even and unlabored.  No signs/symptoms of pain/distress noted on patient's face/body movements.  Safety maintained in OBS unit with RN monitoring patient's status.  0910  Patient given t;ylenol for lower back pain #8.  Patient denied SI and HI.  Denied A/V hallucinations.  Has been in bed with eyes closed since breakfast.  Respirations even and unlabored.  No signs/symptoms of pain/distress noted on patient's face/body movements.  Safety maintained in OBS unit.  1200  CBG 289.  Patient ate lunch and went back to sleep.  Respirations even and unlabored.  No signs/symptoms of pain/distress noted on patient's face/body movements.  Safety maintained in OBS unit.  1325   Novolog 5 units given per MD order.  Approximately 30 minutes after insulin given, CBG was 314.  BP 144/105 P81.  MD informed.

## 2014-10-24 NOTE — Discharge Summary (Signed)
[facility] OBS UNIT DISCHARGE   Talhah Lockett is an 53 y.o. male. Total Time spent with patient: 45 minutes  Assessment: AXIS I:  Alcohol Abuse, Depressive Disorder NOS, Substance Abuse and Substance Induced Mood Disorder AXIS II:  Deferred AXIS III:   Past Medical History  Diagnosis Date  . Drug abuse   . Alcohol abuse   . Diabetes mellitus   . Uncontrolled hypertension   . Arthritis   . Gout   . DTs (delirium tremens)     history of   . Crack cocaine use   . Noncompliance with medication regimen    AXIS IV:  other psychosocial or environmental problems and problems related to social environment AXIS V:  51-60 moderate symptoms  Plan:  -Discharge home with outpatient resources for substance abuse and psychiatry.  -Bus pass to his home in [facility]  Subjective:   Camil Trembath is a 53 y.o. male patient presents to [facility] with complaints of polysubstance abuse and requesting detox. He spent the night in the [place] without incident. Pt denies SI, Hi, and AVH, contracts for safety. He reports that he would like outpatient resources and he will followup with treatment programs on his own. Ne[place] bus pass to [place]. He was recently in the OBS UNIT in August for the same condition. He has been hypertensive with 170's/110's while in OBS and we will treat this STAT (now resolved).  His withdrawal scores are as follows:  CIWA: 0   COWS: 0 (cleared from COWS on 12/6)  HPI:  Patient states that he drinks 12 beers daily and "I use cocaine, marijuana, alcohol and anything I can get.  I'm tire and I got to get some help"  Patient denies suicidal/homicidal ideation, psychosis (occurs only with drug use), and paranoia (only occurs when high on drugs).  Patient states "I really need some help"  Review of Systems  Neurological: Positive for tremors. Negative for seizures and loss of consciousness.  Psychiatric/Behavioral: Positive for depression and substance abuse. Negative  for hallucinations. The patient is not nervous/anxious and does not have insomnia.   All other systems reviewed and are negative.  Family History  Problem Relation Age of Onset  . Hypertension       HPI Elements:   Location:  Polysubstance abuse. Quality:  Depression. Severity:  Depression. Timing:  several days.  Past Psychiatric History: Past Medical History  Diagnosis Date  . Drug abuse   . Alcohol abuse   . Diabetes mellitus   . Uncontrolled hypertension   . Arthritis   . Gout   . DTs (delirium tremens)     history of   . Crack cocaine use   . Noncompliance with medication regimen     reports that he has been smoking Cigarettes.  He has been smoking about 0.25 packs per day. He has never used smokeless tobacco. He reports that he drinks about 7.2 oz of alcohol per week. He reports that he uses illicit drugs (Cocaine). Family History  Problem Relation Age of Onset  . Hypertension       Living Arrangements: Alone   Abuse/Neglect [facility]) Physical Abuse: Denies Verbal Abuse: Yes, past (Comment) (as a child) Sexual Abuse: Denies Allergies:   Allergies  Allergen Reactions  . Invega [Paliperidone] Shortness Of Breath    Vision changes    ACT Assessment Complete:  Yes:    Educational Status    Risk to Self: Risk to self with the past 6 months  Is patient at risk for suicide?: Yes  Risk to Others:    Abuse: Abuse/Neglect Assessment (Assessment to be complete while patient is alone) Physical Abuse: Denies Verbal Abuse: Yes, past (Comment) (as a child) Sexual Abuse: Denies Exploitation of patient/patient's resources: Denies Self-Neglect: Denies  Prior Inpatient Therapy:    Prior Outpatient Therapy:    Additional Information:      Objective: Blood pressure 176/112, pulse 71, temperature 97.9 F (36.6 C), temperature source Oral, resp. rate 16, height 5' 9.5" (1.765 m), weight 87.998 kg (194 lb), SpO2 100 %.Body mass index is 28.25 kg/(m^2). Results for orders  placed or performed during the hospital encounter of 10/23/14 (from the past 72 hour(s))  Glucose, capillary     Status: Abnormal   Collection Time: 10/23/14  9:04 PM  Result Value Ref Range   Glucose-Capillary 292 (H) 70 - 99 mg/dL  Glucose, capillary     Status: Abnormal   Collection Time: 10/24/14  6:33 AM  Result Value Ref Range   Glucose-Capillary 267 (H) 70 - 99 mg/dL   Labs are reviewed see values above. Medications reviewed.  Current Facility-Administered Medications  Medication Dose Route Frequency Provider Last Rate Last Dose  . acetaminophen (TYLENOL) tablet 650 mg  650 mg Oral Q6H PRN Hampton Abbot, MD      . alum & mag hydroxide-simeth (MAALOX/MYLANTA) 200-200-20 MG/5ML suspension 30 mL  30 mL Oral Q4H PRN Hampton Abbot, MD      . magnesium hydroxide (MILK OF MAGNESIA) suspension 30 mL  30 mL Oral Daily PRN Hampton Abbot, MD      . traZODone (DESYREL) tablet 100 mg  100 mg Oral QHS PRN Hampton Abbot, MD   100 mg at 10/23/14 2112    Psychiatric Specialty Exam:     Blood pressure 176/112, pulse 71, temperature 97.9 F (36.6 C), temperature source Oral, resp. rate 16, height 5' 9.5" (1.765 m), weight 87.998 kg (194 lb), SpO2 100 %.Body mass index is 28.25 kg/(m^2).  General Appearance: Casual and Disheveled  Eye Contact::  Good  Speech:  Clear and Coherent and Normal Rate  Volume:  Normal  Mood:  Anxious and Depressed  Affect:  Congruent and Depressed  Thought Process:  Circumstantial and Goal Directed  Orientation:  Full (Time, Place, and Person)  Thought Content:  Rumination  Suicidal Thoughts:  No  Homicidal Thoughts:  No  Memory:  Immediate;   Good Recent;   Good Remote;   Good  Judgement:  Fair  Insight:  Lacking  Psychomotor Activity:  Tremor  Concentration:  Fair  Recall:  Good  Fund of Knowledge:Fair  Language: Good  Akathisia:  No  Handed:  Right  AIMS (if indicated):     Assets:  Communication Skills Desire for Improvement Housing Social Support   Sleep:      Musculoskeletal: Strength & Muscle Tone: within normal limits Gait & Station: normal Patient leans: N/A  Treatment Plan Summary:  -Seek inpatient rehab, if not found by 2:00PM... -Discharge home with outpatient resources for substance abuse and psychiatry.  -Bus pass to home in [name]  [name], [place] 10/24/2014 12:15 AM   Patient is psychiatrically stable and can be discharged with outpatient care

## 2014-10-24 NOTE — H&P (Signed)
[place] OBS UNIT H&P   Sherrard Amy is an 53 y.o. male. Total Time spent with patient: 45 minutes  Assessment: AXIS I:  Alcohol Abuse, Depressive Disorder NOS, Substance Abuse and Substance Induced Mood Disorder AXIS II:  Deferred AXIS III:   Past Medical History  Diagnosis Date  . Drug abuse   . Alcohol abuse   . Diabetes mellitus   . Uncontrolled hypertension   . Arthritis   . Gout   . DTs (delirium tremens)     history of   . Crack cocaine use   . Noncompliance with medication regimen    AXIS IV:  other psychosocial or environmental problems and problems related to social environment AXIS V:  51-60 moderate symptoms  Plan:  -Discharge home with outpatient resources for substance abuse and psychiatry.  -Bus pass to his home in [facility]  Subjective:   Fredrick Guinane is a 53 y.o. male patient presents to [facility] with complaints of polysubstance abuse and requesting detox. He spent the night in the [place] without incident. Pt denies SI, Hi, and AVH, contracts for safety. He reports that he would like outpatient resources and he will followup with treatment programs on his own. [place] pass [place]. He was recently in the OBS UNIT in August for the same condition. He has been hypertensive with 170's/110's while in OBS and we will treat this STAT (now resolved).  His withdrawal scores are as follows:  CIWA: 0   COWS: 0 (cleared from COWS on 12/6)  HPI:  Patient states that he drinks 12 beers daily and "I use cocaine, marijuana, alcohol and anything I can get.  I'm tire and I got to get some help"  Patient denies suicidal/homicidal ideation, psychosis (occurs only with drug use), and paranoia (only occurs when high on drugs).  Patient states "I really need some help"  Review of Systems  Neurological: Positive for tremors. Negative for seizures and loss of consciousness.  Psychiatric/Behavioral: Positive for depression and substance abuse. Negative for  hallucinations. The patient is not nervous/anxious and does not have insomnia.   All other systems reviewed and are negative.  Family History  Problem Relation Age of Onset  . Hypertension       HPI Elements:   Location:  Polysubstance abuse. Quality:  Depression. Severity:  Depression. Timing:  several days.  Past Psychiatric History: Past Medical History  Diagnosis Date  . Drug abuse   . Alcohol abuse   . Diabetes mellitus   . Uncontrolled hypertension   . Arthritis   . Gout   . DTs (delirium tremens)     history of   . Crack cocaine use   . Noncompliance with medication regimen     reports that he has been smoking Cigarettes.  He has been smoking about 0.25 packs per day. He has never used smokeless tobacco. He reports that he drinks about 7.2 oz of alcohol per week. He reports that he uses illicit drugs (Cocaine). Family History  Problem Relation Age of Onset  . Hypertension       Living Arrangements: Alone   Abuse/Neglect [facility]) Physical Abuse: Denies Verbal Abuse: Yes, past (Comment) (as a child) Sexual Abuse: Denies Allergies:   Allergies  Allergen Reactions  . Invega [Paliperidone] Shortness Of Breath    Vision changes    ACT Assessment Complete:  Yes:    Educational Status    Risk to Self: Risk to self with the past 6 months  Is patient at risk for suicide?: Yes  Risk to Others:    Abuse: Abuse/Neglect Assessment (Assessment to be complete while patient is alone) Physical Abuse: Denies Verbal Abuse: Yes, past (Comment) (as a child) Sexual Abuse: Denies Exploitation of patient/patient's resources: Denies Self-Neglect: Denies  Prior Inpatient Therapy:    Prior Outpatient Therapy:    Additional Information:      Objective: Blood pressure 176/112, pulse 71, temperature 97.9 F (36.6 C), temperature source Oral, resp. rate 16, height 5' 9.5" (1.765 m), weight 87.998 kg (194 lb), SpO2 100 %.Body mass index is 28.25 kg/(m^2). Results for orders placed  or performed during the hospital encounter of 10/23/14 (from the past 72 hour(s))  Glucose, capillary     Status: Abnormal   Collection Time: 10/23/14  9:04 PM  Result Value Ref Range   Glucose-Capillary 292 (H) 70 - 99 mg/dL  Glucose, capillary     Status: Abnormal   Collection Time: 10/24/14  6:33 AM  Result Value Ref Range   Glucose-Capillary 267 (H) 70 - 99 mg/dL   Labs are reviewed see values above. Medications reviewed.  Current Facility-Administered Medications  Medication Dose Route Frequency Provider Last Rate Last Dose  . acetaminophen (TYLENOL) tablet 650 mg  650 mg Oral Q6H PRN Hampton Abbot, MD      . alum & mag hydroxide-simeth (MAALOX/MYLANTA) 200-200-20 MG/5ML suspension 30 mL  30 mL Oral Q4H PRN Hampton Abbot, MD      . magnesium hydroxide (MILK OF MAGNESIA) suspension 30 mL  30 mL Oral Daily PRN Hampton Abbot, MD      . traZODone (DESYREL) tablet 100 mg  100 mg Oral QHS PRN Hampton Abbot, MD   100 mg at 10/23/14 2112    Psychiatric Specialty Exam:     Blood pressure 176/112, pulse 71, temperature 97.9 F (36.6 C), temperature source Oral, resp. rate 16, height 5' 9.5" (1.765 m), weight 87.998 kg (194 lb), SpO2 100 %.Body mass index is 28.25 kg/(m^2).  General Appearance: Casual and Disheveled  Eye Contact::  Good  Speech:  Clear and Coherent and Normal Rate  Volume:  Normal  Mood:  Anxious and Depressed  Affect:  Congruent and Depressed  Thought Process:  Circumstantial and Goal Directed  Orientation:  Full (Time, Place, and Person)  Thought Content:  Rumination  Suicidal Thoughts:  No  Homicidal Thoughts:  No  Memory:  Immediate;   Good Recent;   Good Remote;   Good  Judgement:  Fair  Insight:  Lacking  Psychomotor Activity:  Tremor  Concentration:  Fair  Recall:  Good  Fund of Knowledge:Fair  Language: Good  Akathisia:  No  Handed:  Right  AIMS (if indicated):     Assets:  Communication Skills Desire for Improvement Housing Social Support   Sleep:      Musculoskeletal: Strength & Muscle Tone: within normal limits Gait & Station: normal Patient leans: N/A  Treatment Plan Summary: -Discharge home with outpatient resources for substance abuse and psychiatry.  -Bus pass to home in [place], [place] 10/24/2014 8:43 AM

## 2014-10-24 NOTE — Progress Notes (Deleted)
Pt alert, oriented and cooperative. Affect/mood sad but brightens on approach.  -SI/HI, -A/[place]. Denies pain or discomfort. Emotional support and encouragement given. Will monitor closely and evaluate for stabilization.

## 2014-10-24 NOTE — Progress Notes (Signed)
1400  Nurse called MD who gave new orders for novolog 8 units sq to be given now, 2 full glasses water, and clonidine 0.2 mg now once.  Orders written and medication given.  Patient presently resting in bed with eyes closed.  No signs/symptoms of pain/distress noted on patient's face/body movements.  Respirations even and unlabored.

## 2014-10-24 NOTE — Progress Notes (Addendum)
1450  Patient's CBG 249.  Manual BP 140/100 P85.  Patient sitting up in bed.  Patient denied SI and HI.  Denied A/V hallucinations.  Denied pain.  Patient stated he is feeling better now and is ready for discharge.  1700  Patient's CBG 171.  Manual BP 150/104 P68.  Patient sitting up in bed eating dinner.  Patient denied SI and HI.  Denied A/V hallucinations.  Denied pain.  Patient is looking forward to discharge.

## 2014-10-25 NOTE — Progress Notes (Signed)
Pt has been accepted to [facility] in [place], [place]. Per [facility] protocol pt given a 7 day supply of medication. Pt will be transported via [facility]. Pt has ACTT services with [facility] and they were notified per pt.'s request.  Charlene Brooke, MSW  Social Worker [phone]

## 2014-10-25 NOTE — Progress Notes (Signed)
Pt discharged to [facility] via [facility] transportation. -SI/HI, -A/[place] No c/o pain or discomfort. Pt belongings returned and meds given.

## 2014-11-25 ENCOUNTER — Observation Stay (HOSPITAL_COMMUNITY)
Admission: AD | Admit: 2014-11-25 | Discharge: 2014-11-27 | Disposition: A | Discharge status: Home / Self Care | Payer: Medicaid Other | Source: Intra-hospital | Attending: Psychiatry | Admitting: Psychiatry

## 2014-11-25 ENCOUNTER — Emergency Department (HOSPITAL_COMMUNITY)
Admission: EM | Admit: 2014-11-25 | Discharge: 2014-11-25 | Disposition: A | Discharge status: Other Acute Inpatient Hospital | Payer: Medicaid Other | Attending: Emergency Medicine | Admitting: Emergency Medicine

## 2014-11-25 ENCOUNTER — Encounter (HOSPITAL_COMMUNITY): Payer: Self-pay

## 2014-11-25 ENCOUNTER — Encounter (HOSPITAL_COMMUNITY): Payer: Self-pay | Admitting: *Deleted

## 2014-11-25 DIAGNOSIS — F1010 Alcohol abuse, uncomplicated: Principal | ICD-10-CM | POA: Insufficient documentation

## 2014-11-25 DIAGNOSIS — Z9114 Patient's other noncompliance with medication regimen: Secondary | ICD-10-CM | POA: Diagnosis not present

## 2014-11-25 DIAGNOSIS — I10 Essential (primary) hypertension: Secondary | ICD-10-CM | POA: Diagnosis not present

## 2014-11-25 DIAGNOSIS — Z8739 Personal history of other diseases of the musculoskeletal system and connective tissue: Secondary | ICD-10-CM | POA: Insufficient documentation

## 2014-11-25 DIAGNOSIS — Z720 Tobacco use: Secondary | ICD-10-CM | POA: Insufficient documentation

## 2014-11-25 DIAGNOSIS — F10230 Alcohol dependence with withdrawal, uncomplicated: Secondary | ICD-10-CM | POA: Diagnosis not present

## 2014-11-25 DIAGNOSIS — Z888 Allergy status to other drugs, medicaments and biological substances status: Secondary | ICD-10-CM | POA: Insufficient documentation

## 2014-11-25 DIAGNOSIS — M1990 Unspecified osteoarthritis, unspecified site: Secondary | ICD-10-CM | POA: Insufficient documentation

## 2014-11-25 DIAGNOSIS — Z590 Homelessness: Secondary | ICD-10-CM | POA: Insufficient documentation

## 2014-11-25 DIAGNOSIS — E119 Type 2 diabetes mellitus without complications: Secondary | ICD-10-CM | POA: Insufficient documentation

## 2014-11-25 DIAGNOSIS — M109 Gout, unspecified: Secondary | ICD-10-CM | POA: Insufficient documentation

## 2014-11-25 DIAGNOSIS — F1410 Cocaine abuse, uncomplicated: Secondary | ICD-10-CM | POA: Insufficient documentation

## 2014-11-25 DIAGNOSIS — Z79899 Other long term (current) drug therapy: Secondary | ICD-10-CM | POA: Insufficient documentation

## 2014-11-25 DIAGNOSIS — F329 Major depressive disorder, single episode, unspecified: Secondary | ICD-10-CM | POA: Diagnosis not present

## 2014-11-25 LAB — COMPREHENSIVE METABOLIC PANEL
ALK PHOS: 87 U/L (ref 39–117)
ALT: 43 U/L (ref 0–53)
ANION GAP: 5 (ref 5–15)
AST: 96 U/L — AB (ref 0–37)
Albumin: 4.3 g/dL (ref 3.5–5.2)
BILIRUBIN TOTAL: 0.9 mg/dL (ref 0.3–1.2)
BUN: 18 mg/dL (ref 6–23)
CALCIUM: 9.5 mg/dL (ref 8.4–10.5)
CO2: 27 mmol/L (ref 19–32)
CREATININE: 1.51 mg/dL — AB (ref 0.50–1.35)
Chloride: 108 mEq/L (ref 96–112)
GFR calc Af Amer: 60 mL/min — ABNORMAL LOW (ref >90)
GFR calc non Af Amer: 51 mL/min — ABNORMAL LOW (ref >90)
GLUCOSE: 151 mg/dL — AB (ref 70–99)
POTASSIUM: 4.1 mmol/L (ref 3.5–5.1)
Sodium: 140 mmol/L (ref 135–145)
Total Protein: 8.2 g/dL (ref 6.0–8.3)

## 2014-11-25 LAB — CBC WITH DIFFERENTIAL/PLATELET
BASOS PCT: 1 % (ref 0–1)
Basophils Absolute: 0 10*3/uL (ref 0.0–0.1)
EOS ABS: 0.1 10*3/uL (ref 0.0–0.7)
Eosinophils Relative: 2 % (ref 0–5)
HCT: 44.9 % (ref 39.0–52.0)
HEMOGLOBIN: 14.5 g/dL (ref 13.0–17.0)
LYMPHS ABS: 2.1 10*3/uL (ref 0.7–4.0)
LYMPHS PCT: 35 % (ref 12–46)
MCH: 29.4 pg (ref 26.0–34.0)
MCHC: 32.3 g/dL (ref 30.0–36.0)
MCV: 90.9 fL (ref 78.0–100.0)
Monocytes Absolute: 0.7 10*3/uL (ref 0.1–1.0)
Monocytes Relative: 11 % (ref 3–12)
Neutro Abs: 3.1 10*3/uL (ref 1.7–7.7)
Neutrophils Relative %: 51 % (ref 43–77)
Platelets: 147 10*3/uL — ABNORMAL LOW (ref 150–400)
RBC: 4.94 MIL/uL (ref 4.22–5.81)
RDW: 13.5 % (ref 11.5–15.5)
WBC: 6 10*3/uL (ref 4.0–10.5)

## 2014-11-25 LAB — CBG MONITORING, ED: Glucose-Capillary: 153 mg/dL — ABNORMAL HIGH (ref 70–99)

## 2014-11-25 LAB — RAPID URINE DRUG SCREEN, HOSP PERFORMED
Amphetamines: NOT DETECTED
BENZODIAZEPINES: NOT DETECTED
Barbiturates: NOT DETECTED
Cocaine: POSITIVE — AB
Opiates: NOT DETECTED
Tetrahydrocannabinol: NOT DETECTED

## 2014-11-25 LAB — ETHANOL: Alcohol, Ethyl (B): <5 mg/dL (ref 0–9)

## 2014-11-25 LAB — SALICYLATE LEVEL: Salicylate Lvl: <4 mg/dL (ref 2.8–20.0)

## 2014-11-25 LAB — ACETAMINOPHEN LEVEL: Acetaminophen (Tylenol), Serum: <10 ug/mL — ABNORMAL LOW (ref 10–30)

## 2014-11-25 MED ORDER — LORAZEPAM 1 MG PO TABS: 0.0000 mg | ORAL_TABLET | Freq: Two times a day (BID) | ORAL | Status: DC

## 2014-11-25 MED ORDER — VITAMIN B-1 100 MG PO TABS
100.0000 mg | ORAL_TABLET | Freq: Every day | ORAL | Status: DC
  Administered 2014-11-26 – 2014-11-27 (×2): 100 mg via ORAL
  Filled 2014-11-25 (×3): qty 1

## 2014-11-25 MED ORDER — ONDANSETRON HCL 4 MG PO TABS: 4.0000 mg | ORAL_TABLET | Freq: Three times a day (TID) | ORAL | Status: DC | PRN

## 2014-11-25 MED ORDER — LORAZEPAM 1 MG PO TABS: 1.0000 mg | ORAL_TABLET | Freq: Three times a day (TID) | ORAL | Status: DC | PRN

## 2014-11-25 MED ORDER — THIAMINE HCL 100 MG/ML IJ SOLN: 100.0000 mg | Freq: Every day | INTRAMUSCULAR | Status: DC

## 2014-11-25 MED ORDER — IBUPROFEN 200 MG PO TABS: 600.0000 mg | ORAL_TABLET | Freq: Three times a day (TID) | ORAL | Status: DC | PRN

## 2014-11-25 MED ORDER — NICOTINE 21 MG/24HR TD PT24
21.0000 mg | MEDICATED_PATCH | Freq: Every day | TRANSDERMAL | Status: DC
  Filled 2014-11-25 (×4): qty 1

## 2014-11-25 MED ORDER — METFORMIN HCL 850 MG PO TABS: 850.0000 mg | ORAL_TABLET | Freq: Two times a day (BID) | ORAL | Status: DC

## 2014-11-25 MED ORDER — CLONIDINE HCL 0.1 MG PO TABS: 0.2000 mg | ORAL_TABLET | Freq: Two times a day (BID) | ORAL | Status: DC

## 2014-11-25 MED ORDER — NICOTINE 21 MG/24HR TD PT24
21.0000 mg | MEDICATED_PATCH | Freq: Every day | TRANSDERMAL | Status: DC
  Filled 2014-11-25: qty 1

## 2014-11-25 MED ORDER — CLONIDINE HCL 0.2 MG PO TABS
0.2000 mg | ORAL_TABLET | Freq: Two times a day (BID) | ORAL | Status: DC
  Administered 2014-11-25 – 2014-11-27 (×5): 0.2 mg via ORAL
  Filled 2014-11-25 (×4): qty 1
  Filled 2014-11-25 (×5): qty 2

## 2014-11-25 MED ORDER — ALUM & MAG HYDROXIDE-SIMETH 200-200-20 MG/5ML PO SUSP: 30.0000 mL | ORAL | Status: DC | PRN

## 2014-11-25 MED ORDER — IBUPROFEN 600 MG PO TABS
600.0000 mg | ORAL_TABLET | Freq: Three times a day (TID) | ORAL | Status: DC | PRN
  Administered 2014-11-27: 600 mg via ORAL
  Filled 2014-11-25: qty 1

## 2014-11-25 MED ORDER — ACETAMINOPHEN 325 MG PO TABS
650.0000 mg | ORAL_TABLET | ORAL | Status: DC | PRN
Start: 2014-11-25 — End: 2014-11-27

## 2014-11-25 MED ORDER — ACETAMINOPHEN 325 MG PO TABS: 650.0000 mg | ORAL_TABLET | ORAL | Status: DC | PRN

## 2014-11-25 MED ORDER — METFORMIN HCL 500 MG PO TABS: 500.0000 mg | ORAL_TABLET | Freq: Two times a day (BID) | ORAL | Status: DC

## 2014-11-25 MED ORDER — LORAZEPAM 1 MG PO TABS
0.0000 mg | ORAL_TABLET | Freq: Four times a day (QID) | ORAL | Status: DC
  Administered 2014-11-26: 2 mg via ORAL
  Administered 2014-11-26: 1 mg via ORAL
  Filled 2014-11-25: qty 1
  Filled 2014-11-25: qty 2

## 2014-11-25 MED ORDER — ZOLPIDEM TARTRATE 5 MG PO TABS: 5.0000 mg | ORAL_TABLET | Freq: Every evening | ORAL | Status: DC | PRN

## 2014-11-25 MED ORDER — LORAZEPAM 1 MG PO TABS
0.0000 mg | ORAL_TABLET | Freq: Four times a day (QID) | ORAL | Status: DC
  Administered 2014-11-25: 1 mg via ORAL
  Filled 2014-11-25: qty 1

## 2014-11-25 MED ORDER — VITAMIN B-1 100 MG PO TABS
100.0000 mg | ORAL_TABLET | Freq: Every day | ORAL | Status: DC
  Administered 2014-11-25: 100 mg via ORAL
  Filled 2014-11-25: qty 1

## 2014-11-25 NOTE — Plan of Care (Signed)
[facility] [facility] Crisis Plan  Reason for Crisis Plan:  Substance Abuse   Plan of Care:  Referral for Substance Abuse  Family Support:   parents  Current Living Environment:  Homeless/shelter  Insurance:   Hospital Account    Name Acct ID Class Status Primary Coverage   [name], [name] [id number] [place]        Guarantor Account (for Hospital Account [id number])    Name Relation to [name]? Acct Type   [name] Self [facility] Yes Behavioral Health   Address Phone       [address] [place], [place] [address] [phone])          Coverage Information (for Hospital Account [id number])    F/O Payor/Plan Precert #   [facility] [place] [facility] MEDICAID/[place] MEDICAID    Subscriber Subscriber #   [name], [name] [id number]   Address Phone   [address], [place] [address] [phone]      Legal Guardian:    self Primary Care Provider:  No PCP Per Patient  Current Outpatient Providers:  ACT  Psychiatrist:   none  Counselor/Therapist:   none  Compliant with Medications:  No  Additional Information:   [name] 1/8/20169:25 PM

## 2014-11-25 NOTE — ED Notes (Signed)
ACT Member:  Garret Reddish [phone]

## 2014-11-25 NOTE — ED Notes (Signed)
Pt went to restroom but did not collect urine specimen.

## 2014-11-25 NOTE — Progress Notes (Signed)
Admission Note:  D: Patient is a 54 year old male who presents voluntarily in no acute distress for the treatment of alcohol and substance abuse. Pt appears anxious and tensed but cooperative with admission process. Pt denies pain, SI/HI/AVH at this time. Patient reports increase alcohol intake and requests for detox. Patient presents with the following withdrawal symptoms: anxiety, hot and cold flashes, mild diarrhea with abdominal rumbling.  A: Skin was assessed, intact -no bruises, rash or wound noted. No tattoo or piercing noted. POC and unit policies explained and understanding verbalized. Consents obtained. Food and fluids offered.  R: Pt had no additional questions or concerns.       Will continue to monitor patient.

## 2014-11-25 NOTE — BH Assessment (Signed)
Writer received consult.  Writer spoke to the nurse and the patient will be moved to a room that will permit a  Tele Assessment.

## 2014-11-25 NOTE — ED Notes (Addendum)
Per pt and ACT representative, pt is homeless.  Pt has been using cocaine and alcohol x 30 years.  Has been under [name] for 6 months.  Not taking his oral diabetes or psych meds.  C/O bilateral foot pain.  Denies SI/HI.  Per ACT, attempting to get pt into rehab.  Looking at [facility] and long term facility

## 2014-11-25 NOTE — Progress Notes (Signed)
Patient was declined from [facility] for medical acuity.   Chesley Noon, MSW, [place] Clinical Social Worker [phone]

## 2014-11-25 NOTE — BH Assessment (Signed)
Assessment Note  [name] is an 54 y.o. male. Patient was brought into the ED by [facility] ACTT team for alcohol and crack cocaine abuse. Patient is currently denying SI/HI, hallucinations, and other self-injurious behaviors.  Patient reports drinking 18 beers daily, last drank 4 beers last night and smoking crack daily $40-200 and last used last night $20 worth.  Patient reports currently participating with [facility] ACTT team but is currently non-compliant with medication because of drug use.  Patient is currenlty homeless and ACTT team been assisting with finding housing.  According to nursing reports that patient was accepted to [facility] but this information was not verified with the ACTT team member.    CSW spoke with Adelina Mings with [facility] team [phone] who reports the patient does not have a bed at [facility] and is still in need of housing.    CSW consulted with [name], NP it is recommended to OBS unit pending available beds.    Axis I: Alcohol Abuse, Substance Induced Mood Disorder and Cocaine use, moderate Axis II: Deferred Axis III:  Past Medical History  Diagnosis Date  . Drug abuse   . Alcohol abuse   . Diabetes mellitus   . Uncontrolled hypertension   . Arthritis   . Gout   . DTs (delirium tremens)     history of   . Crack cocaine use   . Noncompliance with medication regimen    Axis IV: economic problems, housing problems, occupational problems, other psychosocial or environmental problems, problems related to social environment, problems with access to health care services and problems with primary support group Axis V: 51-60 moderate symptoms  Past Medical History:  Past Medical History  Diagnosis Date  . Drug abuse   . Alcohol abuse   . Diabetes mellitus   . Uncontrolled hypertension   . Arthritis   . Gout   . DTs (delirium tremens)     history of   . Crack cocaine use   . Noncompliance with medication regimen     Past Surgical History   Procedure Laterality Date  . Mandible reconstruction      Family History:  Family History  Problem Relation Age of Onset  . Hypertension      Social History:  reports that he has been smoking Cigarettes.  He has been smoking about 0.25 packs per day. He has never used smokeless tobacco. He reports that he drinks about 7.2 oz of alcohol per week. He reports that he uses illicit drugs (Cocaine).  Additional Social History:     CIWA: CIWA-Ar BP: 108/76 mmHg Pulse Rate: 68 COWS:    Allergies:  Allergies  Allergen Reactions  . Invega [Paliperidone] Shortness Of Breath    Vision changes    Home Medications:  (Not in a hospital admission)  OB/GYN Status:  No LMP for male patient.  General Assessment Data Location of Assessment: [place] [facility] ACT Assessment: Yes Is this a Tele or Face-to-Face Assessment?: Face-to-Face Is this an Initial Assessment or a Re-assessment for this encounter?: Initial Assessment Living Arrangements: Alone (homeless) Can pt return to current living arrangement?: Yes Admission Status: Voluntary Is patient capable of signing voluntary admission?: Yes Transfer from: Home (homeless) Referral Source: Self/Family/Friend  Medical Screening Exam ([place]) Medical Exam completed: Yes  [place] Living Arrangements: Alone (homeless) Name of Psychiatrist: [place] Name of Therapist: [facility]  Education Status Is patient currently in school?: No  Risk to self with the past 6 months  Suicidal Ideation: No-Not Currently/Within Last 6 Months Suicidal Intent: No-Not Currently/Within Last 6 Months Is patient at risk for suicide?: No Suicidal Plan?: No-Not Currently/Within Last 6 Months Access to Means: No What has been your use of drugs/alcohol within the last 12 months?: alcohol, crack Previous Attempts/Gestures: Yes How many times?: 3 Triggers for Past Attempts: Family contact, Hallucinations, Other (Comment) (SA) Intentional  Self Injurious Behavior: None Family Suicide History: No Recent stressful life event(s): Conflict (Comment), Loss (Comment), Financial Problems, Other (Comment) (SA, homelessness) Persecutory voices/beliefs?: No Depression: Yes Depression Symptoms: Isolating, Fatigue, Loss of interest in usual pleasures, Guilt, Feeling angry/irritable, Feeling worthless/self pity Substance abuse history and/or treatment for substance abuse?: Yes  Risk to Others within the past 6 months Homicidal Ideation: No-Not Currently/Within Last 6 Months Thoughts of Harm to Others: No-Not Currently Present/Within Last 6 Months Current Homicidal Intent: No-Not Currently/Within Last 6 Months Current Homicidal Plan: No-Not Currently/Within Last 6 Months Access to Homicidal Means: No History of harm to others?: No Assessment of Violence: None Noted Does patient have access to weapons?: No Criminal Charges Pending?: No Does patient have a court date: No  Psychosis Hallucinations: None noted (has history but currently denied) Delusions: None noted  Mental Status Report Appear/Hygiene: Disheveled Eye Contact: Fair Motor Activity: Freedom of movement Speech: Logical/coherent Level of Consciousness: Alert Mood: Depressed Affect: Appropriate to circumstance Anxiety Level: Minimal Thought Processes: Coherent Judgement: Unimpaired Orientation: Person, Situation, Time, Place Obsessive Compulsive Thoughts/Behaviors: None  Cognitive Functioning Concentration: Fair Memory: Recent Intact, Remote Intact IQ: Average Insight: Fair Impulse Control: Fair Appetite: Fair Sleep: Decreased Total Hours of Sleep: 4 Vegetative Symptoms: None  ADLScreening [facility]) Patient's cognitive ability adequate to safely complete daily activities?: Yes Patient able to express need for assistance with ADLs?: Yes Independently performs ADLs?: Yes (appropriate for developmental age)  Prior Inpatient Therapy Prior  Inpatient Therapy: Yes Prior Therapy Dates: many years Prior Therapy Facilty/Provider(s): [facility], [facility], [facility], [facility] Reason for Treatment: SI,SA  Prior Outpatient Therapy Prior Outpatient Therapy: Yes Prior Therapy Dates: 2015 Prior Therapy Facilty/Provider(s): [name] Reason for Treatment: Schizophrenic, Bipolar   ADL Screening (condition at time of admission) Patient's cognitive ability adequate to safely complete daily activities?: Yes Patient able to express need for assistance with ADLs?: Yes Independently performs ADLs?: Yes (appropriate for developmental age)             Advance Directives (For Healthcare) Does patient have an advance directive?: No Would patient like information on creating an advanced directive?: No - patient declined information    Additional Information 1:1 In Past 12 Months?: No CIRT Risk: No Elopement Risk: No Does patient have medical clearance?: Yes     Disposition:  Disposition Initial Assessment Completed for this Encounter: Yes Disposition of Patient: Other dispositions Other disposition(s): Other (Comment) (pending)  On Site Evaluation by:   Reviewed with Physician:    Chesley Noon A 11/25/2014 1:56 PM

## 2014-11-25 NOTE — Progress Notes (Signed)
[place] INPATIENT:  Family/Significant Other Suicide Prevention Education  Suicide Prevention Education:  Patient Refusal for Family/Significant Other Suicide Prevention Education: The patient [name] has refused to provide written consent for family/significant other to be provided Family/Significant Other Suicide Prevention Education during admission and/or prior to discharge.  Physician notified.  Loyal Gambler B 11/25/2014, 9:06 PM

## 2014-11-25 NOTE — BH Assessment (Signed)
[place] Assessment Progress Note  Pt has been accepted to the [facility] Observation Unit by Waylan Boga, NP. Per Claudette Head, TS, pt has been assigned to Obs bed 5 by Debarah Crape, RN, [facility]. Pt has signed Voluntary Admission and Consent for Treatment as well as Consent to Release Information. Signed forms have been faxed to [facility]. [name] nurse, Chrys Racer, has been notified. She agrees to send original paperwork along with pt via [facility], and to call report to [phone] or [phone].  This writer was also asked to seek placement for the pt at [facility] and [facility].  Per Melissa at [facility], no detox beds are available.  I then called [facility].  They report that beds are currently available.  Referral information has been faxed to them, and I have confirmed receipt.  At this time a decision is pending.  Per [name], pt will be transferred to the Observation Unit to await their decision.  Jalene Mullet, MA Triage Specialist 11/25/2014 @ 16:28

## 2014-11-25 NOTE — ED Provider Notes (Signed)
CSN: [id number]     Arrival date & time 11/25/14  1054 History  This chart was scribed for non-physician practitioner, Brent General, PA-C working with No att. providers found by Judithann Sauger, ED Scribe. The patient was seen in room [facility] and the patient's care was started at 12:22 PM    Chief Complaint  Patient presents with  . Drug Problem  . Foot Pain   Patient is a 54 y.o. male presenting with lower extremity pain. The history is provided by the patient. No language interpreter was used.  Foot Pain   HPI Comments: [name] is a 54 y.o. male who presents to the Emergency Department for detox from alcohol and crack cocaine. He reports drinking about half a case of beer and using 40 dollars worth of crack cocaine daily. He reports that he has been using these drugs from 30 years. He states that he was referred here by [facility]. He denies SI/HI recently but admits to SI in the past. He reports chronic numbness in his toe and knee pain due to his DM.   Past Medical History  Diagnosis Date  . Drug abuse   . Alcohol abuse   . Diabetes mellitus   . Uncontrolled hypertension   . Arthritis   . Gout   . DTs (delirium tremens)     history of   . Crack cocaine use   . Noncompliance with medication regimen    Past Surgical History  Procedure Laterality Date  . Mandible reconstruction     Family History  Problem Relation Age of Onset  . Hypertension     History  Substance Use Topics  . Smoking status: Current Every Day Smoker -- 0.25 packs/day    Types: Cigarettes  . Smokeless tobacco: Never Used  . Alcohol Use: 7.2 oz/week    12 Cans of beer per week    Review of Systems  Constitutional: Negative for fever.  Psychiatric/Behavioral: Negative for suicidal ideas.      Allergies  Invega  Home Medications   Prior to Admission medications   Medication Sig Start Date End Date Taking? Authorizing Provider  cloNIDine (CATAPRES) 0.2 MG tablet Take 1 tablet  (0.2 mg total) by mouth 2 (two) times daily. 10/24/14  Yes [name], FNP  metFORMIN (GLUCOPHAGE) 850 MG tablet Take 1 tablet (850 mg total) by mouth 2 (two) times daily with a meal. 10/24/14  Yes [name], FNP  atenolol (TENORMIN) 50 MG tablet Take 2 tablets (100 mg total) by mouth daily. Patient not taking: Reported on 11/25/2014 10/24/14   [name], FNP   BP 109/68 mmHg  Pulse 71  Temp(Src) 98.2 F (36.8 C) (Oral)  Resp 18  SpO2 100% Physical Exam  Constitutional: He is oriented to person, place, and time. He appears well-developed and well-nourished. No distress.  HENT:  Head: Normocephalic and atraumatic.  Mouth/Throat: Oropharynx is clear and moist. No oropharyngeal exudate.  Eyes: Conjunctivae and EOM are normal. Right eye exhibits no discharge. Left eye exhibits no discharge. No scleral icterus.  Neck: Normal range of motion. Neck supple. No tracheal deviation present.  Cardiovascular: Normal rate, regular rhythm and normal heart sounds.   No murmur heard. Pulmonary/Chest: Effort normal and breath sounds normal. No respiratory distress.  Lungs clear equally bilaterally.   Abdominal: Soft. Bowel sounds are normal. There is no tenderness.  Musculoskeletal: Normal range of motion. He exhibits no edema or tenderness.  Knees with full range of motion bilaterally with mild  pain to right knee on palpation and range of motion. No acute abnormalities noted. Patient's feet without any obvious deformity, contusions, abrasions, signs of injury, erythema, swelling, signs of infection or abnormality.  Neurological: He is alert and oriented to person, place, and time. No cranial nerve deficit. Coordination normal.  Skin: Skin is warm and dry. No rash noted. He is not diaphoretic.  Psychiatric:  Patient's mood is depressed with affect appropriate to mood. Speech is normal, communicative. Behavior is slightly withdrawn, patient does not appear to be responding to any internal stimuli.  Thought content revolves around patient's substance abuse problems. Judgment and insight are appropriate.  Nursing note and vitals reviewed.   ED Course  Procedures (including critical care time) DIAGNOSTIC STUDIES: Oxygen Saturation is 100% on RA, normal by my interpretation.    COORDINATION OF CARE: 12:26 PM- Pt advised of plan for treatment and pt agrees.   Labs Review Labs Reviewed  CBC WITH DIFFERENTIAL - Abnormal; Notable for the following:    Platelets 147 (*)    All other components within normal limits  COMPREHENSIVE METABOLIC PANEL - Abnormal; Notable for the following:    Glucose, Bld 151 (*)    Creatinine, Ser 1.51 (*)    AST 96 (*)    GFR calc non Af Amer 51 (*)    GFR calc Af Amer 60 (*)    All other components within normal limits  ACETAMINOPHEN LEVEL - Abnormal; Notable for the following:    Acetaminophen (Tylenol), Serum <10.0 (*)    All other components within normal limits  URINE RAPID DRUG SCREEN (HOSP PERFORMED) - Abnormal; Notable for the following:    Cocaine POSITIVE (*)    All other components within normal limits  CBG MONITORING, ED - Abnormal; Notable for the following:    Glucose-Capillary 153 (*)    All other components within normal limits  ETHANOL  SALICYLATE LEVEL    Imaging Review No results found.   EKG Interpretation None      MDM   Final diagnoses:  Alcohol dependence with uncomplicated withdrawal  Cocaine abuse    Patient sent here by ACT team for possible inpatient placement for detox program. Patient well-appearing, afebrile, non-tachycardic, non-tachypnea, non-hypoxic, and in no acute distress. With ACT team wreck many patient come here, we'll place TTS consult to see if patient needs inpatient criteria. Patient only has specific somatic complaints of his feet hurting in his right knee hurting. On exam there is no evidence of acute pathology. Patient reporting that these are chronic issues, and ongoing. He states they are not  new injuries nor are they new pains for him. I do not believe his feet or his knee or further workup, we will place patient on TTS consult and psych hold.  I personally performed the services described in this documentation, which was scribed in my presence. The recorded information has been reviewed and is accurate.   BP 109/68 mmHg  Pulse 71  Temp(Src) 98.2 F (36.8 C) (Oral)  Resp 18  SpO2 100%  Signed,  Dahlia Bailiff, PA-C 10:21 PM     Carrie Mew, PA-C 11/25/14 2221  Ernestina Patches, MD 11/29/14 [phone]

## 2014-11-25 NOTE — Progress Notes (Signed)
PHARMACIST - PHYSICIAN COMMUNICATION DR:   CONCERNING:  METFORMIN SAFE ADMINISTRATION POLICY  RECOMMENDATION: Metformin has been placed on DISCONTINUE (rejected order) STATUS and should be reordered only after any of the conditions below are ruled out.  Current safety recommendations include avoiding metformin for a minimum of 48 hours after the patient's exposure to intravenous contrast media.  DESCRIPTION:  The Pharmacy Committee has adopted a policy that restricts the use of metformin in hospitalized patients until all the contraindications to administration have been ruled out. Specific contraindications are: [x]  Serum creatinine ? 1.5 for males []  Serum creatinine ? 1.4 for females []  Shock, acute MI, sepsis, hypoxemia, dehydration []  Planned administration of intravenous iodinated contrast media []  Heart Failure patients with low EF []  Acute or chronic metabolic acidosis (including DKA)

## 2014-11-25 NOTE — Progress Notes (Signed)
CSW informed ACTT team member [name] of the updated disposition to the OBS unit and potential discharge in 24 hours if stable.  CSW informed her that the patient was declined from [facility] for medical acuity.     Chesley Noon, MSW, [place] Clinical Social Worker [phone]

## 2014-11-25 NOTE — ED Notes (Signed)
Patient to be transferred to OBS after 1900.  Report called to Christa D., RN.  Patient admitted to psyche ED for alcohol and cocaine abuse.  Patient is homeless.  He is calm and cooperative.  He denies SI/HI/AVH.  Patient will go to OBS bed #7.

## 2014-11-26 DIAGNOSIS — F1010 Alcohol abuse, uncomplicated: Secondary | ICD-10-CM

## 2014-11-26 DIAGNOSIS — F1994 Other psychoactive substance use, unspecified with psychoactive substance-induced mood disorder: Secondary | ICD-10-CM

## 2014-11-26 DIAGNOSIS — F329 Major depressive disorder, single episode, unspecified: Secondary | ICD-10-CM

## 2014-11-26 MED ORDER — LORAZEPAM 1 MG PO TABS
1.0000 mg | ORAL_TABLET | Freq: Three times a day (TID) | ORAL | Status: DC
  Administered 2014-11-27 (×2): 1 mg via ORAL
  Filled 2014-11-26 (×2): qty 1

## 2014-11-26 MED ORDER — LORAZEPAM 1 MG PO TABS
1.0000 mg | ORAL_TABLET | Freq: Four times a day (QID) | ORAL | Status: AC
  Administered 2014-11-26 (×4): 1 mg via ORAL
  Filled 2014-11-26 (×4): qty 1

## 2014-11-26 MED ORDER — LOPERAMIDE HCL 2 MG PO CAPS: 2.0000 mg | ORAL_CAPSULE | ORAL | Status: DC | PRN

## 2014-11-26 MED ORDER — LORAZEPAM 1 MG PO TABS: 1.0000 mg | ORAL_TABLET | Freq: Every day | ORAL | Status: DC

## 2014-11-26 MED ORDER — ADULT MULTIVITAMIN W/MINERALS CH
1.0000 | ORAL_TABLET | Freq: Every day | ORAL | Status: DC
  Administered 2014-11-26 – 2014-11-27 (×2): 1 via ORAL
  Filled 2014-11-26 (×3): qty 1

## 2014-11-26 MED ORDER — ONDANSETRON 4 MG PO TBDP: 4.0000 mg | ORAL_TABLET | Freq: Four times a day (QID) | ORAL | Status: DC | PRN

## 2014-11-26 MED ORDER — LORAZEPAM 1 MG PO TABS: 1.0000 mg | ORAL_TABLET | Freq: Two times a day (BID) | ORAL | Status: DC

## 2014-11-26 MED ORDER — HYDROXYZINE HCL 25 MG PO TABS
25.0000 mg | ORAL_TABLET | Freq: Four times a day (QID) | ORAL | Status: DC | PRN
  Administered 2014-11-27: 25 mg via ORAL
  Filled 2014-11-26: qty 1

## 2014-11-26 NOTE — BH Assessment (Signed)
Consulted Conrad,NP whom is recommending that patient remain in obs unit overnight for further observation to be evaluated in the morning to determine d/c.  Shaune Pollack, MS, [place] Assessment Counselor

## 2014-11-26 NOTE — Progress Notes (Addendum)
Pt appears very confused and keeps picking up a cup with napkins and trying to drink it. Pt states,'I have not slept for 5 days." He is eating and falling asleep with food in his mouth. He constantly has to be awakened to make sure he does not choke. Pt is letargic with garbled speech. NP made aware. Pt woke up and stated,"I am so hungry I have not eaten in 5 days." Pt was given graham crackers and peanut butter with ice cream. He is very pleasant but does appear slow to respond and confused at times .7pm-Report to [place].

## 2014-11-26 NOTE — H&P (Signed)
[name]   [name] is an 54 y.o. male. Total Time spent with patient: 45 minutes  Assessment: AXIS I:  Alcohol Abuse, MDD, Substance Abuse and Substance Induced Mood Disorder AXIS II:  Deferred AXIS III:   Past Medical History  Diagnosis Date  . Drug abuse   . Alcohol abuse   . Diabetes mellitus   . Uncontrolled hypertension   . Arthritis   . Gout   . DTs (delirium tremens)     history of   . Crack cocaine use   . Noncompliance with medication regimen    AXIS IV:  other psychosocial or environmental problems and problems related to social environment AXIS V:  51-60 moderate symptoms  Plan:  See below  Subjective:   [name] is a 54 y.o. male patient presents to [facility] with complaints of polysubstance abuse and requesting detox from alcohol and cocaine. Pt is alert/oriented x4, calm, cooperative, and answers questions appropriately. Pt does deny SI, HI, and AVH, contracts for safety. Pt is known to this provider from prior assessments and he does not appear to be at baseline. He is intermittently disoriented and reaching for his cup and missing it as well as being difficult to wake. No neurological deficits noted once woken and he does arouse easily to verbal stimuli, but falls back asleep when not engaging in conversation. Pt reports that he would like to followup with outpatient counseling and psychiatry resources for substance abuse. The initial plan was to discharge this pt today. However, given pt's extreme somnolence and his report to this NP that he has gone 5 days without food and sleep secondary to continuous cocaine abuse, we are holding him overnight to reassess in the AM. Pt is too somnolent at this time to safety acquire housing (homeless) and resources independently and may be in danger if discharged in his current condition.   HPI:  [name] is an 54 y.o. male. Patient was brought into the ED by [facility] ACTT team for alcohol and  crack cocaine abuse. Patient is currently denying SI/HI, hallucinations, and other self-injurious behaviors. Patient reports drinking 18 beers daily, last drank 4 beers last night and smoking crack daily $40-200 and last used last night $20 worth. Patient reports currently participating with [facility] ACTT team but is currently non-compliant with medication because of drug use. Patient is currenlty homeless and ACTT team been assisting with finding housing. According to nursing reports that patient was accepted to [facility] but this information was not verified with the ACTT team member.   CSW spoke with [name] with [facility] ACTT team [phone] who reports the patient does not have a bed at [facility] and is still in need of housing.   CSW consulted with [name], NP it is recommended to OBS unit pending available beds.  Review of Systems  Constitutional: Negative.   HENT: Negative.   Eyes: Negative.   Respiratory: Negative.   Cardiovascular: Negative.   Gastrointestinal: Negative.   Genitourinary: Negative.   Musculoskeletal: Negative.   Skin: Negative.   Neurological: Negative.   Endo/Heme/Allergies: Negative.   Psychiatric/Behavioral: Positive for depression and substance abuse (cocaine "5 days, all day, all night, no sleep or food"). Negative for suicidal ideas and hallucinations. The patient is nervous/anxious and has insomnia (pt reports being exhausted; very somnolent currently).       Family History  Problem Relation Age of Onset  . Hypertension       HPI Elements:   Location:  Polysubstance abuse. Quality:  Depression. Severity:  Depression. Timing:  several days.  Past Medical History  Diagnosis Date  . Drug abuse   . Alcohol abuse   . Diabetes mellitus   . Uncontrolled hypertension   . Arthritis   . Gout   . DTs (delirium tremens)     history of   . Crack cocaine use   . Noncompliance with medication regimen       reports that he has been smoking  Cigarettes.  He has been smoking about 0.25 packs per day. He has never used smokeless tobacco. He reports that he drinks about 7.2 oz of alcohol per week. He reports that he uses illicit drugs (Cocaine).  Family History  Problem Relation Age of Onset  . Hypertension       Living Arrangements: Alone (homeless at this time)   Abuse/Neglect [facility]) Physical Abuse: Denies Verbal Abuse: Yes, past (Comment) (as a child) Sexual Abuse: Denies  Allergies:   Allergies  Allergen Reactions  . Invega [Paliperidone] Shortness Of Breath    Vision changes    ACT Assessment Complete:  Yes:    Educational Status    Risk to Self: Risk to self with the past 6 months Is patient at risk for suicide?: Yes  Risk to Others:    Abuse: Abuse/Neglect Assessment (Assessment to be complete while patient is alone) Physical Abuse: Denies Verbal Abuse: Yes, past (Comment) (as a child) Sexual Abuse: Denies Exploitation of patient/patient's resources: Denies Self-Neglect: Denies  Prior Inpatient Therapy:    Prior Outpatient Therapy:    Additional Information:      Objective: BP 148/84 mmHg  Pulse 79  Temp(Src) 98.3 F (36.8 C) (Oral)  Resp 18  Ht 5\' 9"  (1.753 m)  Wt 87.998 kg (194 lb)  BMI 28.64 kg/m2  SpO2 100%    Labs are reviewed see values above. Medications reviewed.Bal negative. UDS + cocaine.    Current facility-administered medications: acetaminophen (TYLENOL) tablet 650 mg, 650 mg, Oral, Q4H PRN, Waylan Boga, NP;  alum & mag hydroxide-simeth (MAALOX/MYLANTA) 200-200-20 MG/5ML suspension 30 mL, 30 mL, Oral, PRN, Waylan Boga, NP;  cloNIDine (CATAPRES) tablet 0.2 mg, 0.2 mg, Oral, BID, Waylan Boga, NP, 0.2 mg at 11/26/14 [id number];  hydrOXYzine (ATARAX/VISTARIL) tablet 25 mg, 25 mg, Oral, Q6H PRN, Nicholaus Bloom, MD ibuprofen (ADVIL,MOTRIN) tablet 600 mg, 600 mg, Oral, Q8H PRN, Waylan Boga, NP;  loperamide (IMODIUM) capsule 2-4 mg, 2-4 mg, Oral, PRN, Nicholaus Bloom, MD LORazepam (ATIVAN)  tablet 1 mg, 1 mg, Oral, QID, 1 mg at 11/26/14 1127 **FOLLOWED BY** [START ON 11/27/2014] LORazepam (ATIVAN) tablet 1 mg, 1 mg, Oral, TID **FOLLOWED BY** [START ON 11/28/2014] LORazepam (ATIVAN) tablet 1 mg, 1 mg, Oral, BID **FOLLOWED BY** [START ON 11/29/2014] LORazepam (ATIVAN) tablet 1 mg, 1 mg, Oral, Daily, Nicholaus Bloom, MD multivitamin with minerals tablet 1 tablet, 1 tablet, Oral, Daily, Nicholaus Bloom, MD, 1 tablet at 11/26/14 [phone];  nicotine (NICODERM CQ - dosed in mg/24 hours) patch 21 mg, 21 mg, Transdermal, Daily, Waylan Boga, NP, 21 mg at 11/26/14 0734;  ondansetron (ZOFRAN-ODT) disintegrating tablet 4 mg, 4 mg, Oral, Q6H PRN, Nicholaus Bloom, MD thiamine (VITAMIN B-1) tablet 100 mg, 100 mg, Oral, Daily, 100 mg at 11/26/14 0733 **OR** [DISCONTINUED] thiamine (B-1) injection 100 mg, 100 mg, Intravenous, Daily, Waylan Boga, NP;  zolpidem (AMBIEN) tablet 5 mg, 5 mg, Oral, QHS PRN, Waylan Boga, NP  Psychiatric Specialty Exam:     BP 148/84 mmHg  Pulse 79  Temp(Src) 98.3 F (36.8 C) (Oral)  Resp 18  Ht 5\' 9"  (1.753 m)  Wt 87.998 kg (194 lb)  BMI 28.64 kg/m2  SpO2 100%   General Appearance: Casual and Disheveled  Eye Contact::  Poor  Speech:  Garbled, slow, but clear when awoken with loud verbal stimuli  Volume:  Normal  Mood:  Depressed  Affect: Depressed, Somnolent  Thought Process:  Circumstantial and Goal Directed  Orientation:  Full (Time, Place, and Person)  Thought Content:  WDL  Suicidal Thoughts:  No  Homicidal Thoughts:  No  Memory:  Immediate;   Good Recent;   Good Remote;   Good  Judgement:  Fair  Insight:  Lacking  Psychomotor Activity:  Tremor  Concentration:  Fair  Recall:  Good  Fund of Knowledge:Fair  Language: Good  Akathisia:  No  Handed:  Right  AIMS (if indicated):     Assets:  Communication Skills Desire for Improvement Housing Social Support  Sleep:      Musculoskeletal: Strength & Muscle  Tone: within normal limits Gait & Station: normal Patient leans: N/A  Treatment Plan Summary: -Hold overnight in the [facility] OBS Unit for safety and stabilization.  -Continue to seek shelters and outpatient followup resources for psychiatry/counseling/substance abuse.  [name], FNP-BC 11/26/2014 3:40PM

## 2014-11-27 DIAGNOSIS — F1010 Alcohol abuse, uncomplicated: Secondary | ICD-10-CM | POA: Diagnosis not present

## 2014-11-27 MED ORDER — CLONIDINE HCL 0.2 MG PO TABS: 0.2000 mg | ORAL_TABLET | Freq: Two times a day (BID) | ORAL | Status: DC

## 2014-11-27 MED ORDER — METFORMIN HCL 850 MG PO TABS: 850.0000 mg | ORAL_TABLET | Freq: Two times a day (BID) | ORAL | Status: DC

## 2014-11-27 MED ORDER — HYDROXYZINE HCL 25 MG PO TABS
25.0000 mg | ORAL_TABLET | Freq: Once | ORAL | Status: AC
  Administered 2014-11-27: 25 mg via ORAL
  Filled 2014-11-27 (×2): qty 1

## 2014-11-27 MED ORDER — HYDRALAZINE HCL 25 MG PO TABS
25.0000 mg | ORAL_TABLET | Freq: Once | ORAL | Status: AC
  Administered 2014-11-27: 25 mg via ORAL
  Filled 2014-11-27 (×2): qty 1

## 2014-11-27 NOTE — Progress Notes (Signed)
[name] now more alert. Has showered and been on phone making plans for discharge. He is now watching TV and says he is ready to go.

## 2014-11-27 NOTE — B[facility] Counselor (Signed)
Spoke to pt ACTT team [name] [phone] and she stated that she would work on finding him a shelter and requested he call her in the morning. She also stated that pt was able to consent for a payee to handle his money so the team will be trying to get him into a permanent placement from there.   Bedelia Person, M.S., LPCA, [employer] Licensed Professional Counselor Associate  Triage Specialist  [facility]  Therapeutic Triage Services Phone: [phone] Fax: [phone]

## 2014-11-27 NOTE — Progress Notes (Signed)
Pt alert and cooperative. -SI/HI, -A/[place], verbally contracts for safety.  Denies pain or physical discomfort. Emotional support and encouragement given. Will continue to monitor closely and evaluate for stabilization.

## 2014-11-27 NOTE — Progress Notes (Signed)
Patient ID: [name], male   DOB: Feb 25, 1961, 54 y.o.   MRN: [id number] Patient extremely confused.  Aske ed this Probation officer "where did everyone go. " Then stated that we were all on a trip. Unable to name snacks and foods he wanted. Drew shape in the air of the snack he wanted.  Continues to request large volumes of food. Has difficuly picking up items on tray.  Continually spilling things, turning things over, unsteady on feet. Will inform NP and continue to monitor.

## 2014-11-27 NOTE — Progress Notes (Signed)
Patient ID: [name], male   DOB: [birth year], 54 y.o.   MRN: [id number]  Pt oriented to name but not location or date. Calm, cooperative, denies SI, HI, AVH. Appetite hearty. Medications given as ordered.  Will continue to monitor for needs/safety.

## 2014-11-27 NOTE — Discharge Summary (Signed)
[facility] O[facility] UNIT DISCHARGE SUMMARY   [name] is an 54 y.o. male. Total Time spent with patient: 25 minutes  Assessment: AXIS I:  Alcohol Abuse, MDD, Substance Abuse and Substance Induced Mood Disorder AXIS II:  Deferred AXIS III:   Past Medical History  Diagnosis Date  . Drug abuse   . Alcohol abuse   . Diabetes mellitus   . Uncontrolled hypertension   . Arthritis   . Gout   . DTs (delirium tremens)     history of   . Crack cocaine use   . Noncompliance with medication regimen    AXIS IV:  other psychosocial or environmental problems and problems related to social environment AXIS V:  51-60 moderate symptoms  Plan:  See below  Subjective:   [name] is a 54 y.o. male patient presents to [facility] with complaints of polysubstance abuse and requesting detox from alcohol and cocaine. Pt is alert/oriented x4, calm, cooperative, and answers questions appropriately. Pt does deny SI, HI, and AVH, contracts for safety. Pt is known to this provider from prior assessments and now appears to be at baseline. Pt looked very ill last night and did not appear to be safe for discharge due to his somnolence and decreased level of consciousness. At this time, he is presenting as stable and states that he has a shelter bed in [facility] waiting for him that he can get to if we can help him with transportation.     HPI:  [name] is an 54 y.o. male. Patient was brought into the ED by [facility] ACTT team for alcohol and crack cocaine abuse. Patient is currently denying SI/HI, hallucinations, and other self-injurious behaviors. Patient reports drinking 18 beers daily, last drank 4 beers last night and smoking crack daily $40-200 and last used last night $20 worth. Patient reports currently participating with [facility] ACTT team but is currently non-compliant with medication because of drug use. Patient is currenlty homeless and ACTT team been assisting with finding housing.  According to nursing reports that patient was accepted to [facility] but this information was not verified with the ACTT team member.   CSW spoke with Adelina Mings with [facility] team [phone] who reports the patient does not have a bed at [facility] and is still in need of housing.   CSW consulted with [name], NP it is recommended to OBS unit pending available beds.  Review of Systems  Constitutional: Negative.   HENT: Negative.   Eyes: Negative.   Respiratory: Negative.   Cardiovascular: Negative.   Gastrointestinal: Negative.   Genitourinary: Negative.   Musculoskeletal: Negative.   Skin: Negative.   Neurological: Negative.   Endo/Heme/Allergies: Negative.   Psychiatric/Behavioral: Positive for depression and substance abuse (cocaine "5 days, all day, all night, no sleep or food"). Negative for suicidal ideas and hallucinations. The patient is nervous/anxious and has insomnia.       Family History  Problem Relation Age of Onset  . Hypertension       HPI Elements:   Location:  Polysubstance abuse. Quality:  Depression. Severity:  Depression. Timing:  several days.  Past Medical History  Diagnosis Date  . Drug abuse   . Alcohol abuse   . Diabetes mellitus   . Uncontrolled hypertension   . Arthritis   . Gout   . DTs (delirium tremens)     history of   . Crack cocaine use   . Noncompliance with medication regimen       reports  that he has been smoking Cigarettes.  He has been smoking about 0.25 packs per day. He has never used smokeless tobacco. He reports that he drinks about 7.2 oz of alcohol per week. He reports that he uses illicit drugs (Cocaine).  Family History  Problem Relation Age of Onset  . Hypertension       Living Arrangements: Alone (homeless at this time)   Abuse/Neglect [facility]) Physical Abuse: Denies Verbal Abuse: Yes, past (Comment) (as a child) Sexual Abuse: Denies  Allergies:   Allergies  Allergen Reactions  . Invega  [Paliperidone] Shortness Of Breath    Vision changes    ACT Assessment Complete:  Yes:    Educational Status    Risk to Self: Risk to self with the past 6 months Is patient at risk for suicide?: Yes  Risk to Others:    Abuse: Abuse/Neglect Assessment (Assessment to be complete while patient is alone) Physical Abuse: Denies Verbal Abuse: Yes, past (Comment) (as a child) Sexual Abuse: Denies Exploitation of patient/patient's resources: Denies Self-Neglect: Denies  Prior Inpatient Therapy:    Prior Outpatient Therapy:    Additional Information:      Objective: BP 137/98 mmHg  Pulse 70  Temp(Src) 98.2 F (36.8 C) (Oral)  Resp 17  Ht 5\' 9"  (1.753 m)  Wt 87.998 kg (194 lb)  BMI 28.64 kg/m2  SpO2 100%    Labs are reviewed see values above. Medications reviewed.Bal negative. UDS + cocaine.    Current facility-administered medications: acetaminophen (TYLENOL) tablet 650 mg, 650 mg, Oral, Q4H PRN, Waylan Boga, NP;  alum & mag hydroxide-simeth (MAALOX/MYLANTA) 200-200-20 MG/5ML suspension 30 mL, 30 mL, Oral, PRN, Waylan Boga, NP;  cloNIDine (CATAPRES) tablet 0.2 mg, 0.2 mg, Oral, BID, Waylan Boga, NP, 0.2 mg at 11/27/14 0756;  hydrOXYzine (ATARAX/VISTARIL) tablet 25 mg, 25 mg, Oral, Q6H PRN, Nicholaus Bloom, MD ibuprofen (ADVIL,MOTRIN) tablet 600 mg, 600 mg, Oral, Q8H PRN, Waylan Boga, NP, 600 mg at 11/27/14 0758;  loperamide (IMODIUM) capsule 2-4 mg, 2-4 mg, Oral, PRN, Nicholaus Bloom, MD [COMPLETED] LORazepam (ATIVAN) tablet 1 mg, 1 mg, Oral, QID, 1 mg at 11/26/14 2153 **FOLLOWED BY** LORazepam (ATIVAN) tablet 1 mg, 1 mg, Oral, TID, 1 mg at 11/27/14 1124 **FOLLOWED BY** [START ON 11/28/2014] LORazepam (ATIVAN) tablet 1 mg, 1 mg, Oral, BID **FOLLOWED BY** [START ON 11/29/2014] LORazepam (ATIVAN) tablet 1 mg, 1 mg, Oral, Daily, Nicholaus Bloom, MD multivitamin with minerals tablet 1 tablet, 1 tablet, Oral, Daily, Nicholaus Bloom, MD, 1 tablet at 11/27/14 0756;  nicotine (NICODERM CQ - dosed in  mg/24 hours) patch 21 mg, 21 mg, Transdermal, Daily, Waylan Boga, NP, 21 mg at 11/26/14 0734;  ondansetron (ZOFRAN-ODT) disintegrating tablet 4 mg, 4 mg, Oral, Q6H PRN, Nicholaus Bloom, MD thiamine (VITAMIN B-1) tablet 100 mg, 100 mg, Oral, Daily, 100 mg at 11/27/14 0755 **OR** [DISCONTINUED] thiamine (B-1) injection 100 mg, 100 mg, Intravenous, Daily, Waylan Boga, NP;  zolpidem (AMBIEN) tablet 5 mg, 5 mg, Oral, QHS PRN, Waylan Boga, NP                                                 Psychiatric Specialty Exam:     BP 137/98 mmHg  Pulse 70  Temp(Src) 98.2 F (36.8 C) (Oral)  Resp 17  Ht 5\' 9"  (1.753 m)  Wt 87.998 kg (194  lb)  BMI 28.64 kg/m2  SpO2 100%   General Appearance: Casual and Disheveled  Eye Contact::  Poor  Speech:  Garbled, slow, but clear when awoken with loud verbal stimuli  Volume:  Normal  Mood:  Depressed  Affect: Depressed, Somnolent  Thought Process:  Circumstantial and Goal Directed  Orientation:  Full (Time, Place, and Person)  Thought Content:  WDL  Suicidal Thoughts:  No  Homicidal Thoughts:  No  Memory:  Immediate;   Good Recent;   Good Remote;   Good  Judgement:  Fair  Insight:  Lacking  Psychomotor Activity:  Tremor  Concentration:  Fair  Recall:  Good  Fund of Knowledge:Fair  Language: Good  Akathisia:  No  Handed:  Right  AIMS (if indicated):     Assets:  Communication Skills Desire for Improvement Housing Social Support  Sleep:      Musculoskeletal: Strength & Muscle Tone: within normal limits Gait & Station: normal Patient leans: N/A  Treatment Plan Summary: -Discharge home with outpatient resources for followup -[facility] TTS has coordinated with [name] ACT Team for followup. -Transport pt to shelter in [place] where he has a bed already  [name], FNP-BC 11/27/2014 1:34 PM

## 2014-11-27 NOTE — Progress Notes (Signed)
[name]'s BP has been elevated (see flow sheet), so hydralazine 25 mg PO and Vistaril 25 mg PO given, per orders. Will recheck BP in 30 minutes.

## 2014-11-27 NOTE — Progress Notes (Signed)
[name] was discharged, via [place], to [facility]. [name] BP was elevated but he was asymptomatic. [employer] NP was told of pt's BP and he authorized discharge. Urged pt to seek outpatient treatment for treatment or ED if necessary. Pt verbalized understanding. He was in no acute distress at time of discharge.

## 2015-01-07 DIAGNOSIS — Z8739 Personal history of other diseases of the musculoskeletal system and connective tissue: Secondary | ICD-10-CM | POA: Diagnosis not present

## 2015-01-07 DIAGNOSIS — Y9389 Activity, other specified: Secondary | ICD-10-CM | POA: Insufficient documentation

## 2015-01-07 DIAGNOSIS — Z79899 Other long term (current) drug therapy: Secondary | ICD-10-CM | POA: Diagnosis not present

## 2015-01-07 DIAGNOSIS — T426X1A Poisoning by other antiepileptic and sedative-hypnotic drugs, accidental (unintentional), initial encounter: Secondary | ICD-10-CM | POA: Diagnosis not present

## 2015-01-07 DIAGNOSIS — Z9119 Patient's noncompliance with other medical treatment and regimen: Secondary | ICD-10-CM | POA: Insufficient documentation

## 2015-01-07 DIAGNOSIS — Z720 Tobacco use: Secondary | ICD-10-CM | POA: Diagnosis not present

## 2015-01-07 DIAGNOSIS — Y9289 Other specified places as the place of occurrence of the external cause: Secondary | ICD-10-CM | POA: Diagnosis not present

## 2015-01-07 DIAGNOSIS — I10 Essential (primary) hypertension: Secondary | ICD-10-CM | POA: Insufficient documentation

## 2015-01-07 DIAGNOSIS — Y998 Other external cause status: Secondary | ICD-10-CM | POA: Insufficient documentation

## 2015-01-07 DIAGNOSIS — E119 Type 2 diabetes mellitus without complications: Secondary | ICD-10-CM | POA: Diagnosis not present

## 2015-01-07 DIAGNOSIS — R5383 Other fatigue: Secondary | ICD-10-CM | POA: Diagnosis present

## 2015-01-08 ENCOUNTER — Emergency Department (HOSPITAL_BASED_OUTPATIENT_CLINIC_OR_DEPARTMENT_OTHER): Payer: Medicaid Other

## 2015-01-08 ENCOUNTER — Encounter (HOSPITAL_BASED_OUTPATIENT_CLINIC_OR_DEPARTMENT_OTHER): Payer: Self-pay | Admitting: *Deleted

## 2015-01-08 ENCOUNTER — Emergency Department (HOSPITAL_BASED_OUTPATIENT_CLINIC_OR_DEPARTMENT_OTHER)
Admission: EM | Admit: 2015-01-08 | Discharge: 2015-01-08 | Disposition: A | Discharge status: Home / Self Care | Payer: Medicaid Other | Attending: Emergency Medicine | Admitting: Emergency Medicine

## 2015-01-08 DIAGNOSIS — T4271XA Poisoning by unspecified antiepileptic and sedative-hypnotic drugs, accidental (unintentional), initial encounter: Secondary | ICD-10-CM

## 2015-01-08 LAB — CBC
HCT: 42.4 % (ref 39.0–52.0)
Hemoglobin: 13.8 g/dL (ref 13.0–17.0)
MCH: 29.2 pg (ref 26.0–34.0)
MCHC: 32.5 g/dL (ref 30.0–36.0)
MCV: 89.8 fL (ref 78.0–100.0)
Platelets: 110 10*3/uL — ABNORMAL LOW (ref 150–400)
RBC: 4.72 MIL/uL (ref 4.22–5.81)
RDW: 12.5 % (ref 11.5–15.5)
WBC: 5.7 10*3/uL (ref 4.0–10.5)

## 2015-01-08 LAB — RAPID URINE DRUG SCREEN, HOSP PERFORMED
Amphetamines: NOT DETECTED
Barbiturates: NOT DETECTED
Benzodiazepines: POSITIVE — AB
COCAINE: NOT DETECTED
Opiates: NOT DETECTED
Tetrahydrocannabinol: NOT DETECTED

## 2015-01-08 LAB — COMPREHENSIVE METABOLIC PANEL
ALT: 30 U/L (ref 0–53)
AST: 55 U/L — AB (ref 0–37)
Albumin: 3.8 g/dL (ref 3.5–5.2)
Alkaline Phosphatase: 63 U/L (ref 39–117)
Anion gap: 3 — ABNORMAL LOW (ref 5–15)
BUN: 10 mg/dL (ref 6–23)
CHLORIDE: 103 mmol/L (ref 96–112)
CO2: 30 mmol/L (ref 19–32)
CREATININE: 0.95 mg/dL (ref 0.50–1.35)
Calcium: 8.7 mg/dL (ref 8.4–10.5)
GFR calc Af Amer: >90 mL/min (ref >90)
Glucose, Bld: 179 mg/dL — ABNORMAL HIGH (ref 70–99)
Potassium: 3.9 mmol/L (ref 3.5–5.1)
Sodium: 136 mmol/L (ref 135–145)
TOTAL PROTEIN: 7.3 g/dL (ref 6.0–8.3)
Total Bilirubin: 0.5 mg/dL (ref 0.3–1.2)

## 2015-01-08 LAB — CBG MONITORING, ED: Glucose-Capillary: 172 mg/dL — ABNORMAL HIGH (ref 70–99)

## 2015-01-08 LAB — I-STAT CG4 LACTIC ACID, ED: Lactic Acid, Venous: 1.42 mmol/L (ref 0.5–2.0)

## 2015-01-08 LAB — TROPONIN I: Troponin I: <0.03 ng/mL (ref <0.031)

## 2015-01-08 NOTE — ED Notes (Addendum)
VSS, EKG noted, NSR.  pt NAD, calm, cooperative, follows commands, mumbles, prefers to rest with eyes closed, abd soft NT, MAEx4, PERRL 56mm sluggish, asked about snack machine. Deputy at [facility]. Pt here from jail, jail paperwork mentions ETOH withdrawal protocol. See MAR on chart. Risperdal 4mg  and vistaril 50 mg given at 1700.

## 2015-01-08 NOTE — ED Notes (Signed)
Sleeping/ resting, given 2nd pillow, no changes, VSS, updated, NAD, calm, passively interactive.

## 2015-01-08 NOTE — Discharge Instructions (Signed)
As discussed, your evaluation today has been largely reassuring.  But, it is important that you monitor your condition carefully, and do not hesitate to return to the ED if you develop new, or concerning changes in your condition. ? ?Otherwise, please follow-up with your physician for appropriate ongoing care. ? ?

## 2015-01-08 NOTE — ED Notes (Signed)
Pt sent to ED for increase lethargy and possible ETOH withdrawl

## 2015-01-08 NOTE — ED Notes (Signed)
CBG resulted as 172 mg. / dcltr.

## 2015-01-08 NOTE — ED Notes (Signed)
Back from CT

## 2015-01-08 NOTE — ED Notes (Signed)
Dr. Molpus at BS 

## 2015-01-08 NOTE — ED Provider Notes (Signed)
CSN: [id number]     Arrival date & time 01/07/15  2354 History   First MD Initiated Contact with Patient on Arrival in Triage   Chief Complaint  Patient presents with  . Lethargic     (Consider location/radiation/quality/duration/timing/severity/associated sxs/prior Treatment) HPI This is a 54 year old male with a history of substance abuse. He was sent from jail for suspected alcohol withdrawal by the jail nurse. He was given 4 milligrams of risk for all, Vistaril and 5 PM yesterday. He has also been given an unspecified medication per CIWA alcohol withdrawal protocol. A review of his records show does not show past prescriptions for respiratory Vistaril. The concerning symptoms are somnolence and lethargy. He has not been tremulous. He has not been tachycardic. He has not been vomiting.  Past Medical History  Diagnosis Date  . Drug abuse   . Alcohol abuse   . Diabetes mellitus   . Uncontrolled hypertension   . Arthritis   . Gout   . DTs (delirium tremens)     history of   . Crack cocaine use   . Noncompliance with medication regimen    Past Surgical History  Procedure Laterality Date  . Mandible reconstruction     Family History  Problem Relation Age of Onset  . Hypertension     History  Substance Use Topics  . Smoking status: Current Every Day Smoker -- 0.25 packs/day    Types: Cigarettes  . Smokeless tobacco: Never Used  . Alcohol Use: 7.2 oz/week    12 Cans of beer per week    Review of Systems  Unable to perform ROS   Allergies  Invega  Home Medications   Prior to Admission medications   Medication Sig Start Date End Date Taking? Authorizing Provider  AmLODIPine Besylate (NORVASC PO) Take by mouth.   Yes Historical Provider, MD  ATENOLOL PO Take by mouth.   Yes Historical Provider, MD  benztropine (COGENTIN) 1 MG tablet Take 1 mg by mouth 2 (two) times daily.   Yes Historical Provider, MD  HydrOXYzine Pamoate (VISTARIL PO) Take by mouth.   Yes Historical  Provider, MD  RisperiDONE (RISPERDAL PO) Take by mouth.   Yes Historical Provider, MD  cloNIDine (CATAPRES) 0.2 MG tablet Take 1 tablet (0.2 mg total) by mouth 2 (two) times daily. 11/27/14   [name], FNP  metFORMIN (GLUCOPHAGE) 850 MG tablet Take 1 tablet (850 mg total) by mouth 2 (two) times daily with a meal. 11/27/14   [name], FNP   BP 145/84 mmHg  Pulse 64  Temp(Src) 97.1 F (36.2 C)  Wt 189 lb (85.73 kg)  SpO2 97%   Physical Exam  General: Well-developed, well-nourished male in no acute distress; appearance consistent with age of record HENT: normocephalic; atraumatic Eyes: pupils equal, round and reactive to light Neck: supple Heart: regular rate and rhythm Lungs: clear to auscultation bilaterally Abdomen: soft; nondistended; no masses or hepatosplenomegaly; bowel sounds present Extremities: No deformity; pulses normal Neurologic: Somnolent, will open eyes to noxious stimuli; noted to move all extremities; no facial droop Skin: Warm and dry   ED Course  Procedures (including critical care time)  EKG Interpretation:  Date & Time: 01/08/2015 12:02 AM  Rate: 71  Rhythm: normal sinus rhythm  QRS Axis: normal  Intervals: normal  ST/T Wave abnormalities: normal  Conduction Disutrbances: left anterior fascicular block  Narrative Interpretation: [place]  Old EKG Reviewed: no significant change    MDM   Nursing notes and vitals signs,  including pulse oximetry, reviewed.  Summary of this visit's results, reviewed by myself:  Labs:  Results for orders placed or performed during the hospital encounter of 01/08/15 (from the past 24 hour(s))  POC CBG, ED     Status: Abnormal   Collection Time: 01/08/15 12:13 AM  Result Value Ref Range   Glucose-Capillary 172 (H) 70 - 99 mg/dL  CBC     Status: Abnormal   Collection Time: 01/08/15 12:33 AM  Result Value Ref Range   WBC 5.7 4.0 - 10.5 K/uL   RBC 4.72 4.22 - 5.81 MIL/uL   Hemoglobin 13.8 13.0 - 17.0 g/dL   HCT  42.4 39.0 - 52.0 %   MCV 89.8 78.0 - 100.0 fL   MCH 29.2 26.0 - 34.0 pg   MCHC 32.5 30.0 - 36.0 g/dL   RDW 12.5 11.5 - 15.5 %   Platelets 110 (L) 150 - 400 K/uL  Comprehensive metabolic panel     Status: Abnormal   Collection Time: 01/08/15 12:33 AM  Result Value Ref Range   Sodium 136 135 - 145 mmol/L   Potassium 3.9 3.5 - 5.1 mmol/L   Chloride 103 96 - 112 mmol/L   CO2 30 19 - 32 mmol/L   Glucose, Bld 179 (H) 70 - 99 mg/dL   BUN 10 6 - 23 mg/dL   Creatinine, Ser 0.95 0.50 - 1.35 mg/dL   Calcium 8.7 8.4 - 10.5 mg/dL   Total Protein 7.3 6.0 - 8.3 g/dL   Albumin 3.8 3.5 - 5.2 g/dL   AST 55 (H) 0 - 37 U/L   ALT 30 0 - 53 U/L   Alkaline Phosphatase 63 39 - 117 U/L   Total Bilirubin 0.5 0.3 - 1.2 mg/dL   GFR calc non Af Amer >90 >90 mL/min   GFR calc Af Amer >90 >90 mL/min   Anion gap 3 (L) 5 - 15  Troponin I     Status: None   Collection Time: 01/08/15 12:33 AM  Result Value Ref Range   Troponin I <0.03 <0.031 ng/mL  I-Stat CG4 Lactic Acid, ED     Status: None   Collection Time: 01/08/15 12:48 AM  Result Value Ref Range   Lactic Acid, Venous 1.42 0.5 - 2.0 mmol/L  Drug screen panel, emergency     Status: Abnormal   Collection Time: 01/08/15  5:11 AM  Result Value Ref Range   Opiates NONE DETECTED NONE DETECTED   Cocaine NONE DETECTED NONE DETECTED   Benzodiazepines POSITIVE (A) NONE DETECTED   Amphetamines NONE DETECTED NONE DETECTED   Tetrahydrocannabinol NONE DETECTED NONE DETECTED   Barbiturates NONE DETECTED NONE DETECTED    Imaging Studies: Ct Head Wo Contrast  01/08/2015   CLINICAL DATA:  Initial evaluation for altered mental status, lethargy.  EXAM: CT HEAD WITHOUT CONTRAST  TECHNIQUE: Contiguous axial images were obtained from the base of the skull through the vertex without intravenous contrast.  COMPARISON:  Prior CT from 08/10/2013  FINDINGS: Mild diffuse prominence of the CSF containing spaces is compatible with generalized cerebral atrophy. Patchy and  confluent hypodensity within the periventricular and deep white matter most consistent with chronic small vessel ischemic disease.  No large vessel territory infarct. No intracranial hemorrhage. Parenchymal calcification within the medial left cerebellum is stable.  No extra-axial fluid collection. No mass lesion or midline shift. No hydrocephalus.  Scalp soft tissues within normal limits. Orbits within normal limits.  IMPRESSION: 1. No acute intracranial process. 2. Atrophy with mild chronic  small vessel ischemic disease.   Electronically Signed   By: Jeannine Boga M.D.   On: 01/08/2015 04:45   7:02 AM Patient stable but still somnolent. It is likely the jail nurse is not familiar with the CIWA scale. The patient's ED nurse, Jeanie Sewer, contacted the jail nurse and she reported the patient to have had a CIWA score of 10 which is clearly inconsistent with his clinical presentation in the ED. He was likely oversedated with benzodiazepines in addition to Risperdal and Vistaril. Dr. Vanita Panda will continue to observe and discharge back to jail when his LOC is improved.      Wynetta Fines, MD 01/08/15 [phone]

## 2015-01-08 NOTE — ED Notes (Signed)
No changes, sleeping, resps e/u, NAD, calm, pending EDP eval.

## 2015-01-08 NOTE — ED Provider Notes (Signed)
7:25 AM Care of the patient assumed at signout. On exam the patient is sleepy, but answers questions properly, denies pain. Vital signs remain similar. According to the ED that accompany the patient, patient is less interactive than baseline.  1:44 PM Patient has been sitting upright, fed himself lunch. Patient has been ambulatory. Patient answers questions appropriate. Patient still remains somewhat somnolent, but no evidence for distress, no complaint sprayed Vital signs remained consistent for the past 10 hours. Patient will be returned to his correctional facility.    Carmin Muskrat, MD 01/08/15 1344

## 2015-03-02 ENCOUNTER — Encounter (HOSPITAL_COMMUNITY): Payer: Self-pay | Admitting: *Deleted

## 2015-03-02 ENCOUNTER — Emergency Department (HOSPITAL_COMMUNITY): Payer: Medicaid Other

## 2015-03-02 ENCOUNTER — Emergency Department (HOSPITAL_COMMUNITY)
Admission: EM | Admit: 2015-03-02 | Discharge: 2015-03-02 | Discharge status: Against Medical Advice | Payer: Medicaid Other | Attending: Emergency Medicine | Admitting: Emergency Medicine

## 2015-03-02 DIAGNOSIS — Z720 Tobacco use: Secondary | ICD-10-CM | POA: Insufficient documentation

## 2015-03-02 DIAGNOSIS — M7989 Other specified soft tissue disorders: Secondary | ICD-10-CM | POA: Insufficient documentation

## 2015-03-02 DIAGNOSIS — E119 Type 2 diabetes mellitus without complications: Secondary | ICD-10-CM | POA: Diagnosis not present

## 2015-03-02 DIAGNOSIS — I10 Essential (primary) hypertension: Secondary | ICD-10-CM | POA: Insufficient documentation

## 2015-03-02 DIAGNOSIS — Z8659 Personal history of other mental and behavioral disorders: Secondary | ICD-10-CM | POA: Insufficient documentation

## 2015-03-02 DIAGNOSIS — Z79899 Other long term (current) drug therapy: Secondary | ICD-10-CM | POA: Diagnosis not present

## 2015-03-02 LAB — CBG MONITORING, ED: Glucose-Capillary: 127 mg/dL — ABNORMAL HIGH (ref 70–99)

## 2015-03-02 MED ORDER — KETOROLAC TROMETHAMINE 60 MG/2ML IM SOLN
30.0000 mg | Freq: Once | INTRAMUSCULAR | Status: AC
  Administered 2015-03-02: 30 mg via INTRAMUSCULAR
  Filled 2015-03-02: qty 2

## 2015-03-02 NOTE — ED Provider Notes (Signed)
CSN: [id number]     Arrival date & time 03/02/15  1722 History  This chart was scribed for Jeannett Senior, PA-C with Pamella Pert, MD by Edison Simon, ED Scribe. This patient was seen in room TR09C/TR09C and the patient's care was started at 6:13 PM.    Chief Complaint  Patient presents with  . Arm Swelling   HPI  HPI Comments: [name] is a 54 y.o. male with history of substance  abuse who presents to the Emergency Department complaining of right wrist swelling and pain with onset 3 weeks ago after falling and twisting it. He states he was evaluated at that time and given prescriptions. He states swelling has decreased but has not resolved. He denies new injury since the incident 3 weeks ago. He states he has not been doing anything with his hand. He uses alcohol. He is not sure if he has used anti-inflammatories, but states he was given prescription for swelling. He states he has purchased pain medication "from someone else" for his symptoms.  Past Medical History  Diagnosis Date  . Drug abuse   . Alcohol abuse   . Diabetes mellitus   . Uncontrolled hypertension   . Arthritis   . Gout   . DTs (delirium tremens)     history of   . Crack cocaine use   . Noncompliance with medication regimen    Past Surgical History  Procedure Laterality Date  . Mandible reconstruction     Family History  Problem Relation Age of Onset  . Hypertension     History  Substance Use Topics  . Smoking status: Current Every Day Smoker -- 0.25 packs/day    Types: Cigarettes  . Smokeless tobacco: Never Used  . Alcohol Use: 7.2 oz/week    12 Cans of beer per week    Review of Systems  Musculoskeletal:       Right wrist swelling and pain  Neurological: Negative for numbness.      Allergies  Invega  Home Medications   Prior to Admission medications   Medication Sig Start Date End Date Taking? Authorizing Provider  AmLODIPine Besylate (NORVASC PO) Take by mouth.     Historical Provider, MD  ATENOLOL PO Take by mouth.    Historical Provider, MD  benztropine (COGENTIN) 1 MG tablet Take 1 mg by mouth 2 (two) times daily.    Historical Provider, MD  cloNIDine (CATAPRES) 0.2 MG tablet Take 1 tablet (0.2 mg total) by mouth 2 (two) times daily. 11/27/14   [name], FNP  HydrOXYzine Pamoate (VISTARIL PO) Take by mouth.    Historical Provider, MD  metFORMIN (GLUCOPHAGE) 850 MG tablet Take 1 tablet (850 mg total) by mouth 2 (two) times daily with a meal. 11/27/14   [name], FNP  RisperiDONE (RISPERDAL PO) Take 2 mg by mouth.     Historical Provider, MD   BP 148/109 mmHg  Pulse 70  Temp(Src) 97.5 F (36.4 C) (Oral)  Resp 18  Ht 5\' 11"  (1.803 m)  Wt 187 lb (84.823 kg)  BMI 26.09 kg/m2  SpO2 97% Physical Exam  Constitutional: He appears well-developed and well-nourished.  HENT:  Head: Normocephalic and atraumatic.  Eyes: Conjunctivae are normal. Right eye exhibits no discharge. Left eye exhibits no discharge.  Pulmonary/Chest: Effort normal. No respiratory distress.  Musculoskeletal:  Mild swelling noted to the right hand, mostly over the dorsum of the hand over the first second and third metacarpals. Motion of the thumb, index and middle  fingers at MCP joints. There is no bruising, erythema, discoloration. There is no warmth to the touch. There is no lesions or wounds. Distal radial pulses intact. Capillary refill is less than 2 seconds distally in all fingers.  Neurological: He is alert. Coordination normal.  Skin: Skin is warm and dry. No rash noted. He is not diaphoretic. No erythema.  Psychiatric: He has a normal mood and affect.  Nursing note and vitals reviewed.   ED Course  Procedures (including critical care time)  DIAGNOSTIC STUDIES: Oxygen Saturation is 97% on room air, normal by my interpretation.    COORDINATION OF CARE: 6:16 PM Discussed treatment plan with patient at beside, the patient agrees with the plan and has no further  questions at this time.   Labs Review Labs Reviewed - No data to display  Imaging Review No results found.   EKG Interpretation None      MDM   Final diagnoses:  Swelling of right hand    patient with persistent right hand swelling for last month. There is no signs of infection. I will reimage him just to make sure there wasn't an occult fracture that was missed. Patient did go to x-ray by then the [facility] lady decided he wanted to leave. He did not wait for the repeat of his x-ray. Stated he wanted to go. His discharge AGAINST MEDICAL ADVICE.   Filed Vitals:   03/02/15 1753 03/02/15 1933  BP: 148/109 160/98  Pulse: 70 71  Temp: 97.5 F (36.4 C) 97.5 F (36.4 C)  TempSrc: Oral Oral  Resp: 18 16  Height: 5\' 11"  (1.803 m)   Weight: 187 lb (84.823 kg)   SpO2: 97% 99%   I personally performed the services described in this documentation, which was scribed in my presence. The recorded information has been reviewed and is accurate.   Jeannett Senior, PA-C 03/03/15 [place], MD 03/03/15 [phone]

## 2015-03-02 NOTE — ED Notes (Signed)
Pt returned from X-ray.  

## 2015-03-02 NOTE — ED Notes (Signed)
Pt states that he fell 3 weeks ago. States that he was seen and given prescriptions for his rt hand swelling but it has not helped. States he also wants to be seen for his arthritic pain in his knees.

## 2015-04-25 ENCOUNTER — Emergency Department (HOSPITAL_COMMUNITY)
Admission: EM | Admit: 2015-04-25 | Discharge: 2015-04-26 | Disposition: A | Discharge status: Home / Self Care | Payer: Medicaid Other | Attending: Emergency Medicine | Admitting: Emergency Medicine

## 2015-04-25 ENCOUNTER — Encounter (HOSPITAL_COMMUNITY): Payer: Self-pay | Admitting: Emergency Medicine

## 2015-04-25 DIAGNOSIS — Z79899 Other long term (current) drug therapy: Secondary | ICD-10-CM | POA: Insufficient documentation

## 2015-04-25 DIAGNOSIS — M1990 Unspecified osteoarthritis, unspecified site: Secondary | ICD-10-CM | POA: Insufficient documentation

## 2015-04-25 DIAGNOSIS — I10 Essential (primary) hypertension: Secondary | ICD-10-CM | POA: Diagnosis not present

## 2015-04-25 DIAGNOSIS — R443 Hallucinations, unspecified: Secondary | ICD-10-CM

## 2015-04-25 DIAGNOSIS — Z791 Long term (current) use of non-steroidal anti-inflammatories (NSAID): Secondary | ICD-10-CM | POA: Insufficient documentation

## 2015-04-25 DIAGNOSIS — E119 Type 2 diabetes mellitus without complications: Secondary | ICD-10-CM | POA: Diagnosis not present

## 2015-04-25 DIAGNOSIS — F1910 Other psychoactive substance abuse, uncomplicated: Secondary | ICD-10-CM

## 2015-04-25 DIAGNOSIS — F10230 Alcohol dependence with withdrawal, uncomplicated: Secondary | ICD-10-CM | POA: Diagnosis present

## 2015-04-25 DIAGNOSIS — Z046 Encounter for general psychiatric examination, requested by authority: Secondary | ICD-10-CM | POA: Diagnosis present

## 2015-04-25 DIAGNOSIS — Z720 Tobacco use: Secondary | ICD-10-CM | POA: Diagnosis not present

## 2015-04-25 LAB — CBC
HEMATOCRIT: 40.4 % (ref 39.0–52.0)
HEMOGLOBIN: 13.1 g/dL (ref 13.0–17.0)
MCH: 29.7 pg (ref 26.0–34.0)
MCHC: 32.4 g/dL (ref 30.0–36.0)
MCV: 91.6 fL (ref 78.0–100.0)
PLATELETS: 130 10*3/uL — AB (ref 150–400)
RBC: 4.41 MIL/uL (ref 4.22–5.81)
RDW: 14.3 % (ref 11.5–15.5)
WBC: 5.1 10*3/uL (ref 4.0–10.5)

## 2015-04-25 LAB — BASIC METABOLIC PANEL
Anion gap: 11 (ref 5–15)
BUN: 16 mg/dL (ref 6–20)
CALCIUM: 8.8 mg/dL — AB (ref 8.9–10.3)
CO2: 24 mmol/L (ref 22–32)
Chloride: 106 mmol/L (ref 101–111)
Creatinine, Ser: 0.83 mg/dL (ref 0.61–1.24)
GFR calc non Af Amer: >60 mL/min (ref >60)
Glucose, Bld: 160 mg/dL — ABNORMAL HIGH (ref 65–99)
POTASSIUM: 3.9 mmol/L (ref 3.5–5.1)
Sodium: 141 mmol/L (ref 135–145)

## 2015-04-25 LAB — ETHANOL: Alcohol, Ethyl (B): 31 mg/dL — ABNORMAL HIGH (ref <5)

## 2015-04-25 MED ORDER — ALUM & MAG HYDROXIDE-SIMETH 200-200-20 MG/5ML PO SUSP: 30.0000 mL | ORAL | Status: DC | PRN

## 2015-04-25 MED ORDER — METFORMIN HCL 500 MG PO TABS
500.0000 mg | ORAL_TABLET | Freq: Two times a day (BID) | ORAL | Status: DC
  Administered 2015-04-26 (×2): 500 mg via ORAL
  Filled 2015-04-25 (×3): qty 1

## 2015-04-25 MED ORDER — LISINOPRIL 10 MG PO TABS
10.0000 mg | ORAL_TABLET | Freq: Every day | ORAL | Status: DC
  Administered 2015-04-25 – 2015-04-26 (×2): 10 mg via ORAL
  Filled 2015-04-25 (×2): qty 1

## 2015-04-25 MED ORDER — CLONIDINE HCL 0.1 MG PO TABS
0.2000 mg | ORAL_TABLET | Freq: Two times a day (BID) | ORAL | Status: DC
  Administered 2015-04-25 – 2015-04-26 (×2): 0.2 mg via ORAL
  Filled 2015-04-25 (×2): qty 2

## 2015-04-25 MED ORDER — LORAZEPAM 1 MG PO TABS: 1.0000 mg | ORAL_TABLET | Freq: Three times a day (TID) | ORAL | Status: DC | PRN

## 2015-04-25 MED ORDER — ONDANSETRON HCL 4 MG PO TABS
4.0000 mg | ORAL_TABLET | Freq: Three times a day (TID) | ORAL | Status: DC | PRN
  Administered 2015-04-25: 4 mg via ORAL
  Filled 2015-04-25: qty 1

## 2015-04-25 MED ORDER — NICOTINE 21 MG/24HR TD PT24
21.0000 mg | MEDICATED_PATCH | Freq: Every day | TRANSDERMAL | Status: DC
  Filled 2015-04-25: qty 1

## 2015-04-25 MED ORDER — IBUPROFEN 200 MG PO TABS: 600.0000 mg | ORAL_TABLET | Freq: Three times a day (TID) | ORAL | Status: DC | PRN

## 2015-04-25 MED ORDER — ACETAMINOPHEN 325 MG PO TABS: 650.0000 mg | ORAL_TABLET | ORAL | Status: DC | PRN

## 2015-04-25 MED ORDER — ATENOLOL 100 MG PO TABS
100.0000 mg | ORAL_TABLET | Freq: Every day | ORAL | Status: DC
  Administered 2015-04-25 – 2015-04-26 (×2): 100 mg via ORAL
  Filled 2015-04-25 (×2): qty 1

## 2015-04-25 MED ORDER — BENZTROPINE MESYLATE 1 MG PO TABS
1.0000 mg | ORAL_TABLET | Freq: Two times a day (BID) | ORAL | Status: DC
  Administered 2015-04-25 – 2015-04-26 (×2): 1 mg via ORAL
  Filled 2015-04-25 (×2): qty 1

## 2015-04-25 MED ORDER — GLIMEPIRIDE 2 MG PO TABS
2.0000 mg | ORAL_TABLET | Freq: Every day | ORAL | Status: DC
  Administered 2015-04-26: 2 mg via ORAL
  Filled 2015-04-25 (×2): qty 1

## 2015-04-25 MED ORDER — ZOLPIDEM TARTRATE 5 MG PO TABS: 5.0000 mg | ORAL_TABLET | Freq: Every evening | ORAL | Status: DC | PRN

## 2015-04-25 MED ORDER — AMLODIPINE BESYLATE 10 MG PO TABS
10.0000 mg | ORAL_TABLET | Freq: Every day | ORAL | Status: DC
  Administered 2015-04-25 – 2015-04-26 (×2): 10 mg via ORAL
  Filled 2015-04-25 (×2): qty 1

## 2015-04-25 NOTE — ED Provider Notes (Signed)
CSN: [id number]     Arrival date & time 04/25/15  1617 History   First MD Initiated Contact with Patient 04/25/15 1721     Chief Complaint  Patient presents with  . IVC      (Consider location/radiation/quality/duration/timing/severity/associated sxs/prior Treatment) HPI  Pt presenting with c/o feeling suicidal.  Also requesting help with substance abuse.  He states he feels he has no path in life and wants help with his problems.  He states he has not been taking his medications for over 4 months.  He presents under IVC paperwork from [name].  Denies recent illness, no fever/vomiting/cough.  He states he hears voices that tell him to kill people.  There are no other associated systemic symptoms, there are no other alleviating or modifying factors.   Past Medical History  Diagnosis Date  . Drug abuse   . Alcohol abuse   . Diabetes mellitus   . Uncontrolled hypertension   . Arthritis   . Gout   . DTs (delirium tremens)     history of   . Crack cocaine use   . Noncompliance with medication regimen    Past Surgical History  Procedure Laterality Date  . Mandible reconstruction     Family History  Problem Relation Age of Onset  . Hypertension     History  Substance Use Topics  . Smoking status: Current Every Day Smoker -- 0.25 packs/day    Types: Cigarettes  . Smokeless tobacco: Never Used  . Alcohol Use: 7.2 oz/week    12 Cans of beer per week    Review of Systems  ROS reviewed and all otherwise negative except for mentioned in HPI    Allergies  Invega  Home Medications   Prior to Admission medications   Medication Sig Start Date End Date Taking? Authorizing Provider  amLODipine (NORVASC) 10 MG tablet Take 10 mg by mouth daily.   Yes Historical Provider, MD  atenolol (TENORMIN) 50 MG tablet Take 100 mg by mouth daily.   Yes Historical Provider, MD  benztropine (COGENTIN) 1 MG tablet Take 1 mg by mouth 2 (two) times daily.   Yes Historical Provider, MD   glimepiride (AMARYL) 2 MG tablet Take 2 mg by mouth daily with breakfast.   Yes Historical Provider, MD  lisinopril (PRINIVIL,ZESTRIL) 10 MG tablet Take 10 mg by mouth daily.   Yes Historical Provider, MD  metFORMIN (GLUCOPHAGE) 500 MG tablet Take 500 mg by mouth 2 (two) times daily with a meal.   Yes Historical Provider, MD  naproxen (NAPROSYN) 500 MG tablet Take 500 mg by mouth 2 (two) times daily as needed for mild pain or moderate pain.   Yes Historical Provider, MD  paliperidone (INVEGA) 3 MG 24 hr tablet Take 3 mg by mouth daily.   Yes Historical Provider, MD  cloNIDine (CATAPRES) 0.2 MG tablet Take 1 tablet (0.2 mg total) by mouth 2 (two) times daily. Patient not taking: Reported on 04/25/2015 11/27/14   [name] Mola, FNP  metFORMIN (GLUCOPHAGE) 850 MG tablet Take 1 tablet (850 mg total) by mouth 2 (two) times daily with a meal. Patient not taking: Reported on 04/25/2015 11/27/14   [name] Jarvis Withrow, FNP   BP 160/98 mmHg  Pulse 80  Temp(Src) 97.7 F (36.5 C) (Oral)  Resp 16  SpO2 100%  Vitals reviewed Physical Exam  Physical Examination: General appearance - alert, well appearing, and in no distress Mental status - alert, oriented to person, place, and time Eyes - no conjunctival  injection, no scleral icterus Chest - clear to auscultation, no wheezes, rales or rhonchi, symmetric air entry Heart - normal rate, regular rhythm, normal S1, S2, no murmurs, rubs, clicks or gallops Neurological - alert, oriented x 3 Musculoskeletal - no joint tenderness, deformity or swelling Extremities - peripheral pulses normal, no pedal edema, no clubbing or cyanosis Skin - normal coloration and turgor, no rashes Psych- normal mood and affect  ED Course  Procedures (including critical care time) Labs Review Labs Reviewed  CBC - Abnormal; Notable for the following:    Platelets 130 (*)    All other components within normal limits  BASIC METABOLIC PANEL - Abnormal; Notable for the following:     Glucose, Bld 160 (*)    Calcium 8.8 (*)    All other components within normal limits  ETHANOL - Abnormal; Notable for the following:    Alcohol, Ethyl (B) 31 (*)    All other components within normal limits  URINE RAPID DRUG SCREEN (HOSP PERFORMED) NOT AT [facility]    Imaging Review No results found.   EKG Interpretation None      MDM   Final diagnoses:  Hallucinations  Polysubstance abuse    Pt has been medically cleared and psych holding orders written  9:39 PM TTS has seen patient, he meets criteria for inpatient admission.  He is under IVC, I have filled out first opinion paperwork. His home bp meds have been given and his BP has improved since arrival.  He is medically cleared.  Holding orders are written.    [name], MD 04/27/15 [phone]

## 2015-04-25 NOTE — BH Assessment (Signed)
Per [name], PA - patient meets criteria for inpatient hospitalization.

## 2015-04-25 NOTE — ED Notes (Signed)
Pt IVC'd from [employer].  BIB [facility].  Papers state: "Respondent is a 54 y/o AA male w/ dx of schizophrenia, cocaine use, and alcohol use.  Respondent is currently intoxicated and is reporting auditory hallucinations commanding him to kill others.  He has not been taking his medications.  He is unable to contract safety and is a danger to others.

## 2015-04-25 NOTE — ED Notes (Signed)
Pt.items are in locker number [id number].

## 2015-04-25 NOTE — BH Assessment (Signed)
Patient has been assessed and his disposition is pending psych evaluation.

## 2015-04-25 NOTE — ED Notes (Signed)
Bed: WA28 Expected date:  Expected time:  Means of arrival:  Comments: 

## 2015-04-25 NOTE — BH Assessment (Addendum)
Assessment Note  Patient is a 54 year old [place] American male that was  IVC'd from [place].  Per IVC paperwork patient reporting having auditory hallucinations commanding him to kill others.  Per documentation in epic patient reports that he is unable to contract safety and is a danger to others.     During the assessment the patient reports that he also see people when he is using.  Patient reports that he did not remember stating that the voices are telling him to kill others.   Patient reports that he has not taken his medication in as couple of days.  Per IVC paperwork the patient has a diagnosis of Bipolar and Schizophrenia.  Patient is also intoxicated and addicted to cocaine and alcohol.    Patient reports a prior hospitalization for substance abuse but not mental health.  Patient denies prior psychiatric hospitalization.  Patient denies physical, sexual or emotional abuse.    Patient reports drinking 5-6 quarts of alcohol daily.  Patient reports using various amounts of cocain every three days.  Patient reports his last drink was earlier today when he drank 2 quarts of liquor.  Patient reports that his last use of cocaine was today when he used $300.00 worth of cocaine.  Patient reports that his first use of alchol was at the age of 59 and his first use of cocaine was at the age of 76.    Patient reports that he was previously living with his daughter but he left there because he knows that he was drinking too much and he is not himself when he does not take his medication.       Axis I: Mood Disorder NOS Axis II: Deferred Axis III:  Past Medical History  Diagnosis Date  . Drug abuse   . Alcohol abuse   . Diabetes mellitus   . Uncontrolled hypertension   . Arthritis   . Gout   . DTs (delirium tremens)     history of   . Crack cocaine use   . Noncompliance with medication regimen    Axis IV: economic problems, housing problems, occupational problems, other psychosocial or  environmental problems, problems related to social environment, problems with access to health care services and problems with primary support group Axis V: 31-40 impairment in reality testing  Past Medical History:  Past Medical History  Diagnosis Date  . Drug abuse   . Alcohol abuse   . Diabetes mellitus   . Uncontrolled hypertension   . Arthritis   . Gout   . DTs (delirium tremens)     history of   . Crack cocaine use   . Noncompliance with medication regimen     Past Surgical History  Procedure Laterality Date  . Mandible reconstruction      Family History:  Family History  Problem Relation Age of Onset  . Hypertension      Social History:  reports that he has been smoking Cigarettes.  He has been smoking about 0.25 packs per day. He has never used smokeless tobacco. He reports that he drinks about 7.2 oz of alcohol per week. He reports that he uses illicit drugs (Cocaine).  Additional Social History:  Alcohol / Drug Use History of alcohol / drug use?: Yes Longest period of sobriety (when/how long): 1 year  Negative Consequences of Use: Financial, Personal relationships, Work / School Substance #2 Name of Substance 2: Alcohol  2 - Age of First Use: 19 2 - Amount (size/oz): 5-6 quarts  2 - Frequency: Daily 2 - Duration: Years, per patient  2 - Last Use / Amount: 2-3 quarts   CIWA: CIWA-Ar BP: (!) 185/122 mmHg Pulse Rate: 89 COWS:    Allergies:  Allergies  Allergen Reactions  . Invega [Paliperidone] Shortness Of Breath    Vision changes    Home Medications:  (Not in a hospital admission)  OB/GYN Status:  No LMP for male patient.  General Assessment Data Location of Assessment: [facility] ED TTS Assessment: In system Is this a Tele or Face-to-Face Assessment?: Tele Assessment Is this an Initial Assessment or a Re-assessment for this encounter?: Initial Assessment Marital status: Single Maiden name: na Is patient pregnant?: No Pregnancy Status: No Living  Arrangements: Other relatives Can pt return to current living arrangement?: Yes Admission Status: Involuntary ([name] IVC'd patient ) Is patient capable of signing voluntary admission?: No Referral Source: Other Consulting civil engineer ) Insurance type: Medicaid  Medical Screening Exam ([place]) Medical Exam completed: Yes  Crisis Care Plan Living Arrangements: Other relatives Name of Psychiatrist: Beverly Sessions  Name of Therapist: [name]   Education Status Is patient currently in school?: No Current Grade: na Highest grade of school patient has completed: na Name of school: na Contact person: na  Risk to self with the past 6 months Suicidal Ideation: No Has patient been a risk to self within the past 6 months prior to admission? : No Suicidal Intent: No Has patient had any suicidal intent within the past 6 months prior to admission? : No Is patient at risk for suicide?: No Suicidal Plan?: No Has patient had any suicidal plan within the past 6 months prior to admission? : No Access to Means: No What has been your use of drugs/alcohol within the last 12 months?: Alcohol and Cocaine Previous Attempts/Gestures: No How many times?: 0 Other Self Harm Risks: NA Triggers for Past Attempts:  (NA) Intentional Self Injurious Behavior: None Family Suicide History: No Recent stressful life event(s): Conflict (Comment), Job Loss, Financial Problems (Hearing voices) Persecutory voices/beliefs?: Yes Depression: Yes Depression Symptoms: Loss of interest in usual pleasures, Fatigue, Isolating, Guilt, Feeling worthless/self pity Substance abuse history and/or treatment for substance abuse?: Yes Suicide prevention information given to non-admitted patients: Not applicable  Risk to Others within the past 6 months Homicidal Ideation: Yes-Currently Present Does patient have any lifetime risk of violence toward others beyond the six months prior to admission? : Yes (comment) Thoughts of Harm to  Others: Yes-Currently Present Comment - Thoughts of Harm to Others: Hearing voices telling him to kill others per [employer]  Current Homicidal Intent: Yes-Currently Present Current Homicidal Plan: No Access to Homicidal Means: No Identified Victim: NA History of harm to others?: No Assessment of Violence: None Noted Violent Behavior Description: NA Does patient have access to weapons?: No Criminal Charges Pending?: No Does patient have a court date: No Is patient on probation?: No  Psychosis Hallucinations: Auditory, Visual Delusions: None noted  Mental Status Report Appearance/Hygiene: Disheveled, In scrubs Eye Contact: Fair Motor Activity: Freedom of movement Speech: Soft Level of Consciousness: Alert Mood: Depressed, Anxious, Suspicious Affect: Anxious, Sullen Anxiety Level: Minimal Thought Processes: Coherent Judgement: Unimpaired Orientation: Person, Place, Time, Situation Obsessive Compulsive Thoughts/Behaviors: None  Cognitive Functioning Concentration: Decreased Memory: Recent Intact, Remote Intact IQ: Average Insight: Fair Impulse Control: Fair Appetite: Fair Weight Loss: 0 Weight Gain: 0 Sleep: Decreased Total Hours of Sleep: 3 Vegetative Symptoms: Decreased grooming, Staying in bed  ADLScreening [facility]) Patient's cognitive ability adequate to safely complete daily  activities?: Yes Patient able to express need for assistance with ADLs?: No Independently performs ADLs?: Yes (appropriate for developmental age)  Prior Inpatient Therapy Prior Inpatient Therapy: Yes Prior Therapy Dates: 1997 Prior Therapy Facilty/Provider(s): Substance Abuse Facility  (Unable to remember the name) Reason for Treatment: Substance Abuse   Prior Outpatient Therapy Prior Outpatient Therapy: Yes Prior Therapy Dates: Ongoing  Prior Therapy Facilty/Provider(s): [name]  Reason for Treatment: Medication Management  Does patient have an ACCT team?: No Does  patient have Intensive In-House Services?  : No Does patient have [name] services? : Yes Does patient have P4CC services?: No  ADL Screening (condition at time of admission) Patient's cognitive ability adequate to safely complete daily activities?: Yes Is the patient deaf or have difficulty hearing?: No Does the patient have difficulty seeing, even when wearing glasses/contacts?: No Does the patient have difficulty concentrating, remembering, or making decisions?: No Patient able to express need for assistance with ADLs?: No Does the patient have difficulty dressing or bathing?: No Independently performs ADLs?: Yes (appropriate for developmental age) Does the patient have difficulty walking or climbing stairs?: No Weakness of Legs: None Weakness of Arms/Hands: None  Home Assistive Devices/Equipment Home Assistive Devices/Equipment: None  Therapy Consults (therapy consults require a physician order) PT Evaluation Needed: No OT Evalulation Needed: No SLP Evaluation Needed: No Abuse/Neglect Assessment (Assessment to be complete while patient is alone) Physical Abuse: Denies Verbal Abuse: Denies Sexual Abuse: Denies Exploitation of patient/patient's resources: Denies Self-Neglect: Denies Values / Beliefs Cultural Requests During Hospitalization: None Spiritual Requests During Hospitalization: None Consults Spiritual Care Consult Needed: No Social Work Consult Needed: No Regulatory affairs officer (For Healthcare) Does patient have an advance directive?: No Would patient like information on creating an advanced directive?: No - patient declined information    Additional Information 1:1 In Past 12 Months?: No CIRT Risk: No Elopement Risk: No Does patient have medical clearance?: Yes     Disposition: Pending psych disposition  Disposition Initial Assessment Completed for this Encounter: Yes Disposition of Patient: Other dispositions (Pending psych disposition )  On Site  Evaluation by:   Reviewed with Physician:    Graciella Freer [place] 04/25/2015 6:40 PM

## 2015-04-26 ENCOUNTER — Inpatient Hospital Stay (HOSPITAL_COMMUNITY)
Admission: AD | Admit: 2015-04-26 | Discharge: 2015-05-03 | DRG: 885 | Disposition: A | Discharge status: Rehabilitation Facility | Payer: Medicaid Other | Source: Intra-hospital | Attending: Psychiatry | Admitting: Psychiatry

## 2015-04-26 ENCOUNTER — Encounter (HOSPITAL_COMMUNITY): Payer: Self-pay | Admitting: General Practice

## 2015-04-26 DIAGNOSIS — R443 Hallucinations, unspecified: Secondary | ICD-10-CM | POA: Insufficient documentation

## 2015-04-26 DIAGNOSIS — F10239 Alcohol dependence with withdrawal, unspecified: Secondary | ICD-10-CM | POA: Diagnosis present

## 2015-04-26 DIAGNOSIS — E119 Type 2 diabetes mellitus without complications: Secondary | ICD-10-CM | POA: Diagnosis present

## 2015-04-26 DIAGNOSIS — I10 Essential (primary) hypertension: Secondary | ICD-10-CM | POA: Diagnosis not present

## 2015-04-26 DIAGNOSIS — E221 Hyperprolactinemia: Secondary | ICD-10-CM | POA: Diagnosis present

## 2015-04-26 DIAGNOSIS — F10230 Alcohol dependence with withdrawal, uncomplicated: Secondary | ICD-10-CM

## 2015-04-26 DIAGNOSIS — E1169 Type 2 diabetes mellitus with other specified complication: Secondary | ICD-10-CM | POA: Clinically undetermined

## 2015-04-26 DIAGNOSIS — E785 Hyperlipidemia, unspecified: Secondary | ICD-10-CM | POA: Diagnosis present

## 2015-04-26 DIAGNOSIS — F17210 Nicotine dependence, cigarettes, uncomplicated: Secondary | ICD-10-CM | POA: Diagnosis present

## 2015-04-26 DIAGNOSIS — F419 Anxiety disorder, unspecified: Secondary | ICD-10-CM | POA: Diagnosis present

## 2015-04-26 DIAGNOSIS — F203 Undifferentiated schizophrenia: Secondary | ICD-10-CM | POA: Diagnosis present

## 2015-04-26 DIAGNOSIS — E1159 Type 2 diabetes mellitus with other circulatory complications: Secondary | ICD-10-CM | POA: Diagnosis present

## 2015-04-26 DIAGNOSIS — F251 Schizoaffective disorder, depressive type: Secondary | ICD-10-CM | POA: Diagnosis present

## 2015-04-26 DIAGNOSIS — M1990 Unspecified osteoarthritis, unspecified site: Secondary | ICD-10-CM | POA: Diagnosis present

## 2015-04-26 DIAGNOSIS — F1420 Cocaine dependence, uncomplicated: Secondary | ICD-10-CM | POA: Diagnosis present

## 2015-04-26 DIAGNOSIS — F1020 Alcohol dependence, uncomplicated: Secondary | ICD-10-CM | POA: Diagnosis present

## 2015-04-26 DIAGNOSIS — F10232 Alcohol dependence with withdrawal with perceptual disturbance: Secondary | ICD-10-CM | POA: Diagnosis not present

## 2015-04-26 HISTORY — DX: Depression, unspecified: F32.A

## 2015-04-26 HISTORY — DX: Major depressive disorder, single episode, unspecified: F32.9

## 2015-04-26 HISTORY — DX: Schizophrenia, unspecified: F20.9

## 2015-04-26 LAB — RAPID URINE DRUG SCREEN, HOSP PERFORMED
AMPHETAMINES: NOT DETECTED
BARBITURATES: NOT DETECTED
Benzodiazepines: NOT DETECTED
Cocaine: POSITIVE — AB
OPIATES: NOT DETECTED
Tetrahydrocannabinol: NOT DETECTED

## 2015-04-26 MED ORDER — AMLODIPINE BESYLATE 10 MG PO TABS
10.0000 mg | ORAL_TABLET | Freq: Every day | ORAL | Status: DC
  Administered 2015-04-27 – 2015-05-02 (×6): 10 mg via ORAL
  Filled 2015-04-26: qty 2
  Filled 2015-04-26: qty 14
  Filled 2015-04-26 (×8): qty 1

## 2015-04-26 MED ORDER — THIAMINE HCL 100 MG/ML IJ SOLN: 100.0000 mg | Freq: Once | INTRAMUSCULAR | Status: DC

## 2015-04-26 MED ORDER — CHLORDIAZEPOXIDE HCL 25 MG PO CAPS
25.0000 mg | ORAL_CAPSULE | ORAL | Status: AC
  Administered 2015-04-29: 25 mg via ORAL
  Filled 2015-04-26: qty 1

## 2015-04-26 MED ORDER — CLONIDINE HCL 0.2 MG PO TABS
0.2000 mg | ORAL_TABLET | Freq: Two times a day (BID) | ORAL | Status: DC
  Administered 2015-04-26 – 2015-05-01 (×11): 0.2 mg via ORAL
  Filled 2015-04-26 (×8): qty 1
  Filled 2015-04-26: qty 2
  Filled 2015-04-26 (×5): qty 1
  Filled 2015-04-26: qty 2
  Filled 2015-04-26: qty 1

## 2015-04-26 MED ORDER — CHLORDIAZEPOXIDE HCL 25 MG PO CAPS
25.0000 mg | ORAL_CAPSULE | Freq: Three times a day (TID) | ORAL | Status: AC
  Administered 2015-04-28 (×2): 25 mg via ORAL
  Filled 2015-04-26 (×2): qty 1

## 2015-04-26 MED ORDER — CHLORDIAZEPOXIDE HCL 25 MG PO CAPS
25.0000 mg | ORAL_CAPSULE | Freq: Four times a day (QID) | ORAL | Status: AC | PRN
  Filled 2015-04-26: qty 1

## 2015-04-26 MED ORDER — BENZTROPINE MESYLATE 1 MG PO TABS
1.0000 mg | ORAL_TABLET | Freq: Two times a day (BID) | ORAL | Status: DC
  Administered 2015-04-26 – 2015-04-27 (×2): 1 mg via ORAL
  Filled 2015-04-26 (×6): qty 1

## 2015-04-26 MED ORDER — ALUM & MAG HYDROXIDE-SIMETH 200-200-20 MG/5ML PO SUSP: 30.0000 mL | ORAL | Status: DC | PRN

## 2015-04-26 MED ORDER — ATENOLOL 100 MG PO TABS
100.0000 mg | ORAL_TABLET | Freq: Every day | ORAL | Status: DC
  Administered 2015-04-27 – 2015-05-02 (×6): 100 mg via ORAL
  Filled 2015-04-26: qty 1
  Filled 2015-04-26: qty 2
  Filled 2015-04-26 (×5): qty 1
  Filled 2015-04-26: qty 14
  Filled 2015-04-26 (×2): qty 1

## 2015-04-26 MED ORDER — LISINOPRIL 10 MG PO TABS
10.0000 mg | ORAL_TABLET | Freq: Every day | ORAL | Status: DC
  Administered 2015-04-27 – 2015-05-02 (×6): 10 mg via ORAL
  Filled 2015-04-26 (×8): qty 1
  Filled 2015-04-26: qty 2

## 2015-04-26 MED ORDER — LOPERAMIDE HCL 2 MG PO CAPS
2.0000 mg | ORAL_CAPSULE | ORAL | Status: AC | PRN
Start: 2015-04-26 — End: 2015-04-29

## 2015-04-26 MED ORDER — VITAMIN B-1 100 MG PO TABS
100.0000 mg | ORAL_TABLET | Freq: Every day | ORAL | Status: DC
  Administered 2015-04-27 – 2015-05-02 (×6): 100 mg via ORAL
  Filled 2015-04-26 (×8): qty 1

## 2015-04-26 MED ORDER — GLIMEPIRIDE 2 MG PO TABS
2.0000 mg | ORAL_TABLET | Freq: Every day | ORAL | Status: DC
  Administered 2015-04-27 – 2015-05-02 (×6): 2 mg via ORAL
  Filled 2015-04-26 (×7): qty 1

## 2015-04-26 MED ORDER — CHLORDIAZEPOXIDE HCL 25 MG PO CAPS
25.0000 mg | ORAL_CAPSULE | Freq: Four times a day (QID) | ORAL | Status: AC
  Administered 2015-04-26 – 2015-04-28 (×6): 25 mg via ORAL
  Filled 2015-04-26 (×5): qty 1

## 2015-04-26 MED ORDER — MAGNESIUM HYDROXIDE 400 MG/5ML PO SUSP: 30.0000 mL | Freq: Every day | ORAL | Status: DC | PRN

## 2015-04-26 MED ORDER — ONDANSETRON 4 MG PO TBDP: 4.0000 mg | ORAL_TABLET | Freq: Four times a day (QID) | ORAL | Status: AC | PRN

## 2015-04-26 MED ORDER — ADULT MULTIVITAMIN W/MINERALS CH
1.0000 | ORAL_TABLET | Freq: Every day | ORAL | Status: DC
  Administered 2015-04-27 – 2015-05-02 (×6): 1 via ORAL
  Filled 2015-04-26 (×10): qty 1

## 2015-04-26 MED ORDER — METFORMIN HCL 500 MG PO TABS
500.0000 mg | ORAL_TABLET | Freq: Two times a day (BID) | ORAL | Status: DC
  Administered 2015-04-27 – 2015-05-02 (×11): 500 mg via ORAL
  Filled 2015-04-26 (×14): qty 1

## 2015-04-26 MED ORDER — HYDROXYZINE HCL 25 MG PO TABS
25.0000 mg | ORAL_TABLET | Freq: Four times a day (QID) | ORAL | Status: AC | PRN
  Administered 2015-04-26 – 2015-04-28 (×2): 25 mg via ORAL
  Filled 2015-04-26 (×2): qty 1

## 2015-04-26 MED ORDER — ACETAMINOPHEN 325 MG PO TABS
650.0000 mg | ORAL_TABLET | Freq: Four times a day (QID) | ORAL | Status: DC | PRN
  Administered 2015-04-26: 650 mg via ORAL
  Filled 2015-04-26: qty 2

## 2015-04-26 MED ORDER — CHLORDIAZEPOXIDE HCL 25 MG PO CAPS
25.0000 mg | ORAL_CAPSULE | Freq: Every day | ORAL | Status: AC
  Administered 2015-05-01: 25 mg via ORAL
  Filled 2015-04-26: qty 1

## 2015-04-26 NOTE — Progress Notes (Signed)
Patient ID: [name], male   DOB: 1961-04-18, 54 y.o.   MRN: [id number]  [name] is a 54 year old african american male admitted IVC to [facility] from [place]. Patient comes to [facility] for substance abuse and auditory hallucinations that are command in nature. Patient has a past medical history of mood disorder NOS, DM II, Hypertension, and drug abuse/cocaine abuse. Patient reports he drinks 10-12 24 ounce beers daily and reports he uses crack cocaine. Patient reports when asked how much he uses, "whenever I have money. I hustle." Patient denies SI/HI and A/V hallucinations on admission, after every question patient stated, "not right now." Patient states that he is having trouble concentrating due to having racing thoughts. [name] does report a headache which may be related to his elevated BP. Patient received PRN Tylenol and his scheduled Catapres. Patient received a meal and was oriented to the unit. Patient verbalized understanding of admission process. Q15 minute safety checks initiated and are maintained.

## 2015-04-26 NOTE — Consult Note (Signed)
[place] [facility]iatry Consult   Reason for Consult:  Alcohol  Use disorder with uncomplicated intoxication, Cocaine use disorder, severe Referring Physician:  EDP Patient Identification: Tysen Roesler MRN:  [id number] Principal Diagnosis: Alcohol dependence with uncomplicated withdrawal Diagnosis:   Patient Active Problem List   Diagnosis Date Noted  . Alcohol dependence with uncomplicated withdrawal [I96.789] 06/25/2014    Priority: High  . Substance abuse [F19.10] 10/23/2014  . Depressive disorder [F32.9]   . Depression [F32.9] 06/26/2014  . Uncontrolled hypertension [I10] 08/09/2013  . Chronic alcohol abuse [F10.10] 08/09/2013  . Alcohol withdrawal [F10.239] 08/09/2013  . Gout [M10.9] 08/09/2013  . Hyperglycemia [R73.9] 08/09/2013  . Substance induced mood disorder [F19.94] 08/08/2013  . CVA (cerebral infarction) [I63.9] 07/09/2012  . Diabetes mellitus [E11.9] 07/09/2012  . Hyperglycemia without ketosis [R73.9] 07/09/2012  . Polysubstance abuse [F19.10] 07/09/2012  . Cocaine abuse [F14.10] 07/09/2012  . Hypertension [I10] 07/09/2012    Total Time spent with patient: 45 minutes  Subjective:   Peirce Deveney is a 54 y.o. male patient admitted with Cocaine use disorder, severe, Alcohol use disorder, severe, uncomplicated withdrawal.  HPI:  Patient is a 54 year old African American male that was evaluated this morning for Alcoholism and Cocaine use.  He was  IVC'd from [facility]. Per IVC paperwork patient reported having auditory hallucinations commanding him to kill others.This morning he stated that he has been using Alcohol to treat his Mental illness.  Patient admitted to a diagnosis of Bipolar disorder and Schizophrenia and stated that he uses substances to take care of his mental illness.   He stated that he has been drinking Alcohol and using Cocaine for the last 20 years.  Patient reports spending $300 dollars a day on Cocaine drinks 5-6 quarts of Alcohol  daily.  Patient admitted to auditory and visual hallucinations when he is intoxicated and  withdrawang from substances.  Patient is still things around the room and would like to get back on his medications.  He denies SI/HI but reports intense auditory hallucinations with voices telling him to kill himself and others.  He has been accepted for admission.  HPI Elements:   Location:  Alcohol use disorder, use, severe, Cocaine use disorder, severe, Schizophrenia by hx, Bipolar disorder by hx. Quality:  severe, auditory visual hallucinations, tremors, . Severity:  severe. Timing:  acute. Duration:  Chronic mental illness and Alcoholism. Context:  [facility] by [facility] for intoxication, auditory/visual hallucination.  Past Medical History:  Past Medical History  Diagnosis Date  . Drug abuse   . Alcohol abuse   . Diabetes mellitus   . Uncontrolled hypertension   . Arthritis   . Gout   . DTs (delirium tremens)     history of   . Crack cocaine use   . Noncompliance with medication regimen     Past Surgical History  Procedure Laterality Date  . Mandible reconstruction     Family History:  Family History  Problem Relation Age of Onset  . Hypertension     Social History:  History  Alcohol Use  . 7.2 oz/week  . 12 Cans of beer per week     History  Drug Use  . Yes  . Special: Cocaine    History   Social History  . Marital Status: Legally Separated    Spouse Name: N/A  . Number of Children: N/A  . Years of Education: N/A   Social History Main Topics  . Smoking status: Current Every Day Smoker -- 0.25 packs/day  Types: Cigarettes  . Smokeless tobacco: Never Used  . Alcohol Use: 7.2 oz/week    12 Cans of beer per week  . Drug Use: Yes    Special: Cocaine  . Sexual Activity: Yes    Birth Control/ Protection: None   Other Topics Concern  . 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 Invega [Paliperidone] Shortness Of Breath    Vision changes    Labs:  Results for orders placed or performed during the hospital encounter of 04/25/15 (from the past 48 hour(s))  CBC     Status: Abnormal   Collection Time: 04/25/15  6:20 PM  Result Value Ref Range   WBC 5.1 4.0 - 10.5 K/uL   RBC 4.41 4.22 - 5.81 MIL/uL   Hemoglobin 13.1 13.0 - 17.0 g/dL   HCT 40.4 39.0 - 52.0 %   MCV 91.6 78.0 - 100.0 fL   MCH 29.7 26.0 - 34.0 pg   MCHC 32.4 30.0 - 36.0 g/dL   RDW 14.3 11.5 - 15.5 %   Platelets 130 (L) 150 - 400 K/uL  Basic metabolic panel     Status: Abnormal   Collection Time: 04/25/15  6:20 PM  Result Value Ref Range   Sodium 141 135 - 145 mmol/L   Potassium 3.9 3.5 - 5.1 mmol/L   Chloride 106 101 - 111 mmol/L   CO2 24 22 - 32 mmol/L   Glucose, Bld 160 (H) 65 - 99 mg/dL   BUN 16 6 - 20 mg/dL   Creatinine, Ser 0.83 0.61 - 1.24 mg/dL   Calcium 8.8 (L) 8.9 - 10.3 mg/dL   GFR calc non Af Amer >60 >60 mL/min   GFR calc Af Amer >60 >60 mL/min    Comment: (NOTE) The eGFR has been calculated using the CKD EPI equation. This calculation has not been validated in all clinical situations. eGFR's persistently <60 mL/min signify possible Chronic Kidney Disease.    Anion gap 11 5 - 15  Ethanol     Status: Abnormal   Collection Time: 04/25/15  6:24 PM  Result Value Ref Range   Alcohol, Ethyl (B) 31 (H) <5 mg/dL    Comment:        LOWEST DETECTABLE LIMIT FOR SERUM ALCOHOL IS 5 mg/dL FOR MEDICAL PURPOSES ONLY   Urine rapid drug screen (hosp performed)not at [facility]     Status: Abnormal   Collection Time: 04/26/15  5:28 AM   Result Value Ref Range   Opiates NONE DETECTED NONE DETECTED   Cocaine POSITIVE (A) NONE DETECTED   Benzodiazepines NONE DETECTED NONE DETECTED   Amphetamines NONE DETECTED NONE DETECTED   Tetrahydrocannabinol NONE DETECTED NONE DETECTED   Barbiturates NONE DETECTED NONE DETECTED    Comment:        DRUG SCREEN FOR MEDICAL PURPOSES ONLY.  IF CONFIRMATION IS NEEDED FOR ANY PURPOSE, NOTIFY LAB WITHIN 5 DAYS.  LOWEST DETECTABLE LIMITS FOR URINE DRUG SCREEN Drug Class       Cutoff (ng/mL) Amphetamine      1000 Barbiturate      200 Benzodiazepine   272 Tricyclics       536 Opiates          300 Cocaine          300 THC              50     Vitals: Blood pressure 131/82, pulse 69, temperature 97.4 F (36.3 C), temperature source Oral, resp. rate 20, SpO2 99 %.  Risk to Self: Suicidal Ideation: No Suicidal Intent: No Is patient at risk for suicide?: No Suicidal Plan?: No Access to Means: No What has been your use of drugs/alcohol within the last 12 months?: Alcohol and Cocaine How many times?: 0 Other Self Harm Risks: NA Triggers for Past Attempts:  (NA) Intentional Self Injurious Behavior: None Risk to Others: Homicidal Ideation: Yes-Currently Present Thoughts of Harm to Others: Yes-Currently Present Comment - Thoughts of Harm to Others: Hearing voices telling him to kill others per [employer]  Current Homicidal Intent: Yes-Currently Present Current Homicidal Plan: No Access to Homicidal Means: No Identified Victim: NA History of harm to others?: No Assessment of Violence: None Noted Violent Behavior Description: NA Does patient have access to weapons?: No Criminal Charges Pending?: No Does patient have a court date: No Prior Inpatient Therapy: Prior Inpatient Therapy: Yes Prior Therapy Dates: 1997 Prior Therapy Facilty/Provider(s): [facility] Abuse Facility  (Unable to remember the name) Reason for Treatment: Substance Abuse  Prior Outpatient Therapy: Prior  Outpatient Therapy: Yes Prior Therapy Dates: Ongoing  Prior Therapy Facilty/Provider(s): [name]  Reason for Treatment: Medication Management  Does patient have an ACCT team?: No Does patient have Intensive In-House Services?  : No Does patient have [name] services? : Yes Does patient have P4CC services?: No  Current Facility-Administered Medications  Medication Dose Route Frequency Provider Last Rate Last Dose  . acetaminophen (TYLENOL) tablet 650 mg  650 mg Oral Q4H PRN [name], MD      . alum & mag hydroxide-simeth (MAALOX/MYLANTA) 200-200-20 MG/5ML suspension 30 mL  30 mL Oral PRN [name], MD      . amLODipine (NORVASC) tablet 10 mg  10 mg Oral Daily [name], MD   10 mg at 04/26/15 0930  . atenolol (TENORMIN) tablet 100 mg  100 mg Oral Daily [name], MD   100 mg at 04/26/15 0929  . benztropine (COGENTIN) tablet 1 mg  1 mg Oral BID [name], MD   1 mg at 04/26/15 0930  . cloNIDine (CATAPRES) tablet 0.2 mg  0.2 mg Oral BID [name], MD   0.2 mg at 04/26/15 0929  . glimepiride (AMARYL) tablet 2 mg  2 mg Oral Q breakfast [name], MD   2 mg at 04/26/15 0730  . ibuprofen (ADVIL,MOTRIN) tablet 600 mg  600 mg Oral Q8H PRN [name], MD      . lisinopril (PRINIVIL,ZESTRIL) tablet 10 mg  10 mg Oral Daily [name], MD   10 mg at 04/26/15 0930  . LORazepam (ATIVAN) tablet 1 mg  1 mg Oral Q8H PRN [name], MD      . metFORMIN (GLUCOPHAGE) tablet 500 mg  500 mg Oral BID WC [name], MD   500 mg at 04/26/15 0730  . nicotine (NICODERM CQ - dosed in mg/24 hours) patch 21 mg  21 mg Transdermal Daily [name],  MD   21 mg at 04/25/15 1849  . ondansetron (ZOFRAN) tablet 4 mg  4 mg Oral Q8H PRN [name], MD   4 mg at 04/25/15 1844  . zolpidem (AMBIEN) tablet 5 mg  5 mg Oral QHS PRN [name], MD       Current Outpatient Prescriptions  Medication Sig Dispense Refill  . amLODipine (NORVASC) 10 MG tablet Take 10 mg by mouth daily.    Marland Kitchen  atenolol (TENORMIN) 50 MG tablet Take 100 mg by mouth daily.    . benztropine (COGENTIN) 1 MG tablet Take 1 mg by mouth 2 (two) times daily.    Marland Kitchen glimepiride (AMARYL) 2 MG tablet Take 2 mg by mouth daily with breakfast.    . lisinopril (PRINIVIL,ZESTRIL) 10 MG tablet Take 10 mg by mouth daily.    . metFORMIN (GLUCOPHAGE) 500 MG tablet Take 500 mg by mouth 2 (two) times daily with a meal.    . naproxen (NAPROSYN) 500 MG tablet Take 500 mg by mouth 2 (two) times daily as needed for mild pain or moderate pain.    . paliperidone (INVEGA) 3 MG 24 hr tablet Take 3 mg by mouth daily.    . cloNIDine (CATAPRES) 0.2 MG tablet Take 1 tablet (0.2 mg total) by mouth 2 (two) times daily. (Patient not taking: Reported on 04/25/2015)    . metFORMIN (GLUCOPHAGE) 850 MG tablet Take 1 tablet (850 mg total) by mouth 2 (two) times daily with a meal. (Patient not taking: Reported on 04/25/2015) 60 tablet 0    Musculoskeletal: Strength & Muscle Tone: within normal limits Gait & Station: normal Patient leans: N/A  Psychiatric Specialty Exam: Physical Exam  Review of Systems  Constitutional: Negative.   HENT: Negative.   Eyes: Negative.   Respiratory: Negative.   Cardiovascular:       HX OF CVA, HTN  Gastrointestinal: Negative.   Genitourinary: Negative.   Skin: Negative.   Neurological: Negative.   Endo/Heme/Allergies: Negative.     Blood pressure 131/82, pulse 69, temperature 97.4 F (36.3 C), temperature source Oral, resp. rate 20, SpO2 99 %.There is no weight on file to calculate BMI.  General Appearance: Casual and Disheveled  Eye Contact::  Good  Speech:  Clear and Coherent and Normal Rate  Volume:  Normal  Mood:  Anxious, Depressed, Hopeless and Helpless  Affect:  Congruent, Depressed and Flat  Thought Process:  Coherent, Goal Directed and Intact  Orientation:  Full (Time, Place, and Person)  Thought Content:  Hallucinations: Auditory Command:  kill self and others Visual  Suicidal Thoughts:   No  Homicidal Thoughts:  No  Memory:  Immediate;   Fair Recent;   Fair Remote;   Fair  Judgement:  Impaired  Insight:  Shallow  Psychomotor Activity:  Tremor  Concentration:  Poor  Recall:  NA  Fund of Knowledge:Fair  Language: Fair  Akathisia:  NA  Handed:  Right  AIMS (if indicated):     Assets:  Desire for Improvement  ADL's:  Impaired  Cognition: WNL  Sleep:      Medical Decision Making: Review of Psycho-Social Stressors (1), Established Problem, Worsening (2), Review of Medication Regimen & Side Effects (2) and Review of New Medication or Change in Dosage (2)  Treatment Plan Summary: Daily contact with patient to assess and evaluate symptoms and progress in treatment and Medication management  Plan:  We will use our Ativan detox protocol for his detox treatment. Disposition: Admit to any facility with available bed  for detox and treatment of other mental illness.  Delfin Gant   PMHNP-BC 04/26/2015 12:43 PM Patient seen face-to-face for psychiatric evaluation, chart reviewed and case discussed with the physician extender and developed treatment plan. Reviewed the information documented and agree with the treatment plan. Corena Pilgrim, MD

## 2015-04-26 NOTE — Progress Notes (Signed)
Adult Psychoeducational Group Note  Date:  04/26/2015 Time:  9:27 PM  Group Topic/Focus:  Wrap-Up Group:   The focus of this group is to help patients review their daily goal of treatment and discuss progress on daily workbooks.  Participation Level:  Did Not Attend  Participation Quality:  Did not attend  Affect:  Did not attend  Cognitive:  Did not attend  Insight: None  Engagement in Group:  Did not attend  Modes of Intervention:  Socialization and Support  Additional Comments:  Did not attend  Debe Coder 04/26/2015, 9:27 PM

## 2015-04-26 NOTE — Tx Team (Signed)
Initial Interdisciplinary Treatment Plan   PATIENT STRESSORS: Marital or family conflict Medication change or noncompliance Substance abuse   PATIENT STRENGTHS: General fund of knowledge   PROBLEM LIST: Problem List/Patient Goals Date to be addressed Date deferred Reason deferred Estimated date of resolution  Psychosis 04/26/2015           Substance Abuse 04/26/2015           "gain coping skills" 04/26/2015           "get some direction and try to find myself" 04/26/2015                  DISCHARGE CRITERIA:  Improved stabilization in mood, thinking, and/or behavior Verbal commitment to aftercare and medication compliance Withdrawal symptoms are absent or subacute and managed without 24-hour nursing intervention  PRELIMINARY DISCHARGE PLAN: Attend PHP/IOP Attend 12-step recovery group Outpatient therapy  PATIENT/FAMIILY INVOLVEMENT: This treatment plan has been presented to and reviewed with the patient, [name].  The patient and family have been given the opportunity to ask questions and make suggestions.  [name] 04/26/2015, 6:37 PM

## 2015-04-26 NOTE — Progress Notes (Signed)
D:Patient in bed on approach.  Writer had to wake him up.  Patient states he is tired and trying to rest.  Patient states he has drinking a lot.  Patient states he hears voices sometimes.  Patient verbally contracts for safety.  Patient denies SI/HI.  Patient has had nt interaction on the unit tonight.  Patient B/P elevated tonight.  PA aware, Patient states he has been noncompliant with his antihypertensive medications. A: Staff to monitor Q 15 mins for safety.  Encouragement and support offered.  Scheduled medications administered per orders.  Vistaril administered prn. R: Patient remains safe on the unit.  Patient did not attend group tonight.  Patient taking administered medications.  Patient minimally interacting with roommate.

## 2015-04-26 NOTE — BH Assessment (Signed)
[place] Assessment Progress Note  Per Corena Pilgrim, MD this pt requires psychiatric hospitalization.  He presents under IVC initiated by [facility] and upheld by [name].  Letitia Libra, RN, [facility] has assigned pt to Rm [id number].  Pt has signed Consent to Release Information to [facility], and a notification call has been placed.  Signed form has been faxed to [facility] along with IVC paperwork.  [name] nurse has been notified.  She agrees to send original signed form along with pt via [employer], and to call report to [phone].  Jalene Mullet, MA Triage Specialist [phone]

## 2015-04-26 NOTE — ED Notes (Signed)
[employer] called for transport 

## 2015-04-26 NOTE — ED Notes (Signed)
Patient is resting comfortably. 

## 2015-04-27 ENCOUNTER — Encounter (HOSPITAL_COMMUNITY): Payer: Self-pay | Admitting: Psychiatry

## 2015-04-27 DIAGNOSIS — F1420 Cocaine dependence, uncomplicated: Secondary | ICD-10-CM | POA: Diagnosis present

## 2015-04-27 DIAGNOSIS — F1020 Alcohol dependence, uncomplicated: Secondary | ICD-10-CM | POA: Diagnosis present

## 2015-04-27 LAB — GLUCOSE, CAPILLARY
GLUCOSE-CAPILLARY: 173 mg/dL — AB (ref 65–99)
Glucose-Capillary: 164 mg/dL — ABNORMAL HIGH (ref 65–99)
Glucose-Capillary: 172 mg/dL — ABNORMAL HIGH (ref 65–99)
Glucose-Capillary: 291 mg/dL — ABNORMAL HIGH (ref 65–99)

## 2015-04-27 MED ORDER — PALIPERIDONE ER 3 MG PO TB24
3.0000 mg | ORAL_TABLET | Freq: Every day | ORAL | Status: DC
  Administered 2015-04-27 – 2015-04-30 (×4): 3 mg via ORAL
  Filled 2015-04-27 (×6): qty 1

## 2015-04-27 MED ORDER — NAPROXEN 375 MG PO TABS
375.0000 mg | ORAL_TABLET | Freq: Two times a day (BID) | ORAL | Status: AC
  Administered 2015-04-27 – 2015-04-30 (×6): 375 mg via ORAL
  Filled 2015-04-27 (×6): qty 1
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
## 2015-04-27 NOTE — Progress Notes (Signed)
The patient attended this evening's [place] group and was appropriate.

## 2015-04-27 NOTE — BHH Group Notes (Signed)
[facility] Group Notes:  (Nursing/MHT/Case Management/Adjunct)  Date:  04/27/2015  Time:  11:15 AM  Type of Therapy:  Nurse Education  Participation Level:  Minimal  Participation Quality:  Appropriate  Affect:  Appropriate  Cognitive:  Alert  Insight:  Appropriate  Engagement in Group:  Engaged  Modes of Intervention:  Discussion  Summary of Progress/Problems:  Lauralyn Primes 04/27/2015, 11:15 AM

## 2015-04-27 NOTE — BHH Suicide Risk Assessment (Signed)
[facility] Admission Suicide Risk Assessment   Nursing information obtained from:  Patient Demographic factors:  Male, Low socioeconomic status Current Mental Status:  NA Loss Factors:  Decline in physical health Historical Factors:  Family history of mental illness or substance abuse Risk Reduction Factors:  Religious beliefs about death Total Time spent with patient: 30 minutes Principal Problem: Schizoaffective disorder, depressive type Diagnosis:   Patient Active Problem List   Diagnosis Date Noted  . Schizoaffective disorder, depressive type [F25.1] 04/27/2015  . Alcohol use disorder, severe, dependence [F10.20] 04/27/2015  . Cocaine use disorder, severe, dependence [F14.20] 04/27/2015  . Alcohol withdrawal with perceptual disturbances [F10.232] 04/27/2015  . Uncontrolled hypertension [I10] 08/09/2013  . Gout [M10.9] 08/09/2013  . Diabetes mellitus [E11.9] 07/09/2012     Continued Clinical Symptoms:  Alcohol Use Disorder Identification Test Final Score (AUDIT): 36 The "Alcohol Use Disorders Identification Test", Guidelines for Use in Primary Care, Second Edition.  World Pharmacologist [facility]). Score between 0-7:  no or low risk or alcohol related problems. Score between 8-15:  moderate risk of alcohol related problems. Score between 16-19:  high risk of alcohol related problems. Score 20 or above:  warrants further diagnostic evaluation for alcohol dependence and treatment.   CLINICAL FACTORS:   Alcohol/Substance Abuse/Dependencies Previous Psychiatric Diagnoses and Treatments   Musculoskeletal: Strength & Muscle Tone: within normal limits Gait & Station: normal Patient leans: N/A  Psychiatric Specialty Exam: Physical Exam  Review of Systems  Constitutional: Positive for malaise/fatigue and diaphoresis.  Neurological: Positive for tremors.  Psychiatric/Behavioral: Positive for depression and substance abuse. The patient is nervous/anxious.   All other systems  reviewed and are negative.   Blood pressure 137/97, pulse 77, temperature 98 F (36.7 C), temperature source Oral, resp. rate 20, height 5\' 10"  (1.778 m), weight 93.895 kg (207 lb).Body mass index is 29.7 kg/(m^2).  Please see H&P.   COGNITIVE FEATURES THAT CONTRIBUTE TO RISK:  Closed-mindedness, Polarized thinking and Thought constriction (tunnel vision)    SUICIDE RISK:   Mild:  Suicidal ideation of limited frequency, intensity, duration, and specificity.  There are no identifiable plans, no associated intent, mild dysphoria and related symptoms, good self-control (both objective and subjective assessment), few other risk factors, and identifiable protective factors, including available and accessible social support.  PLAN OF CARE: Please see H&P.        Medical Decision Making:  Review of Psycho-Social Stressors (1), Review or order clinical lab tests (1), Discuss test with performing physician (1), Review and summation of old records (2), Established Problem, Worsening (2), Review of Last Therapy Session (1), Review of Medication Regimen & Side Effects (2) and Review of New Medication or Change in Dosage (2)  I certify that inpatient services furnished can reasonably be expected to improve the patient's condition.   Wilver Tignor md 04/27/2015, 11:41 AM

## 2015-04-27 NOTE — Progress Notes (Signed)
[name] is quiet, cooperative and voices no complaints today. HE requires much encouragement to get OOB and take his morning meds at 0900. HE completed his daily assessment and on it he wrote he denied SI today and he rates his depression, hopelessness and anxiety " 5/9/5", respectively. A He attended his afternoon group but is slow to join the discusssion.  R Safety in place.

## 2015-04-27 NOTE — Tx Team (Signed)
Interdisciplinary Treatment Plan Update (Adult)  Date:  04/27/2015   Time Reviewed:  8:18 AM   Progress in Treatment: Attending groups: Yes. Participating in groups:  Yes. Taking medication as prescribed:  Yes. Tolerating medication:  Yes. Family/Significant othe contact made:  Yes Patient understands diagnosis:  Yes  As evidenced by seeking help with  alcoholism Discussing patient identified problems/goals with staff:  Yes, see initial care plan. Medical problems stabilized or resolved:  Yes. Denies suicidal/homicidal ideation: Yes. Issues/concerns per patient self-inventory:  No. Other:  New problem(s) identified:  Discharge Plan or Barriers:  Asking for referral into rehab  Reason for Continuation of Hospitalization:  Detox, Medication Administration, Depression   Comments:  Patient is a 54 year old African American male that was IVC'd from [place]. Per IVC paperwork patient reporting having auditory hallucinations commanding him to kill others. Per documentation in epic patient reports that he is unable to contract safety and is a danger to others.  During the assessment the patient reports that he also sees people when he is using. Patient reports that he did not remember stating that the voices are telling him to kill others. Patient reports drinking 5-6 quarts of alcohol daily. Patient reports using various amounts of cocain every three days. Patient reports his last drink was earlier today when he drank 2 quarts of liquor. Patient reports that his last use of cocaine was today when he used $300.00 worth of cocaine. Patient reports that his first use of alchol was at the age of 77 and his first use of cocaine was at the age of 34.  Patient reports that he was previously living with his daughter but he left there because he knows that he was drinking too much and he is not himself when he does not take his medication.Invega injection plus PO, Librium taper  Estimated  length of stay: 4-5 days  New goal(s):  Review of initial/current patient goals per problem list:     Attendees: Patient:  04/27/2015 8:18 AM   Family:   04/27/2015 8:18 AM   Physician:  Ursula Alert, MD 04/27/2015 8:18 AM   Nursing:  Vira Browns , RN 04/27/2015 8:18 AM   CSW:    Roque Lias, LCSW   04/27/2015 8:18 AM   Other:  04/27/2015 8:18 AM   Other:   04/27/2015 8:18 AM   Other:  Lars Pinks, Nurse CM 04/27/2015 8:18 AM   Other:  Lucinda Dell, [name] 04/27/2015 8:18 AM   Other:  Norberto Sorenson, [place]  04/27/2015 8:18 AM   Other:  04/27/2015 8:18 AM   Other:  04/27/2015 8:18 AM   Other:  04/27/2015 8:18 AM   Other:  04/27/2015 8:18 AM   Other:  04/27/2015 8:18 AM   Other:   04/27/2015 8:18 AM    Scribe for Treatment Team:   [name], 04/27/2015 8:18 AM

## 2015-04-27 NOTE — [facility] Group Notes (Signed)
[facility] Group Notes:  (Counselor/Nursing/MHT/Case Management/Adjunct)  04/27/2015 1:15PM  Type of Therapy:  Group Therapy  Participation Level:  Active  Participation Quality:  Appropriate  Affect:  Flat  Cognitive:  Oriented  Insight:  Improving  Engagement in Group:  Limited  Engagement in Therapy:  Limited  Modes of Intervention:  Discussion, Exploration and Socialization  Summary of Progress/Problems: The topic for group was balance in life.  Pt participated in the discussion about when their life was in balance and out of balance and how this feels.  Pt discussed ways to get back in balance and short term goals they can work on to get where they want to be.  [name] states that he is unbalanced because he knows he has been making poor choices. Talked about his struggles with alcoholism and cocaine abuse.  Other group members suggested that he is actually doing better than he thinks he is, because he is accepting responsibility for his actions rather than blaming others, which is often the natural tendency.  He appreciated this feedback, and talked about his desire to make a change by going to rehab and ending his relationship "with the bottle and the rock.  I want to be there for my grandchildren, and also make my family proud."   [name] 04/27/2015 2:39 PM

## 2015-04-28 DIAGNOSIS — E785 Hyperlipidemia, unspecified: Secondary | ICD-10-CM | POA: Clinically undetermined

## 2015-04-28 DIAGNOSIS — E1169 Type 2 diabetes mellitus with other specified complication: Secondary | ICD-10-CM | POA: Clinically undetermined

## 2015-04-28 LAB — LIPID PANEL
CHOL/HDL RATIO: 2.7 ratio
Cholesterol: 131 mg/dL (ref 0–200)
HDL: 48 mg/dL (ref >40)
LDL Cholesterol: 24 mg/dL (ref 0–99)
Triglycerides: 296 mg/dL — ABNORMAL HIGH (ref <150)
VLDL: 59 mg/dL — ABNORMAL HIGH (ref 0–40)

## 2015-04-28 LAB — GLUCOSE, CAPILLARY
GLUCOSE-CAPILLARY: 232 mg/dL — AB (ref 65–99)
Glucose-Capillary: 191 mg/dL — ABNORMAL HIGH (ref 65–99)

## 2015-04-28 LAB — TSH: TSH: 3.901 u[IU]/mL (ref 0.350–4.500)

## 2015-04-28 MED ORDER — SIMVASTATIN 20 MG PO TABS
20.0000 mg | ORAL_TABLET | Freq: Every day | ORAL | Status: DC
  Administered 2015-04-28 – 2015-05-02 (×5): 20 mg via ORAL
  Filled 2015-04-28 (×2): qty 1
  Filled 2015-04-28: qty 14
  Filled 2015-04-28 (×5): qty 1

## 2015-04-28 NOTE — Plan of Care (Signed)
Problem: Ineffective individual coping Goal: STG: Patient will remain free from self harm Outcome: Progressing Pt safe on the unit  Problem: Alteration in thought process Goal: LTG-Patient behavior demonstrates decreased signs psychosis No signs nor symptoms of psychosis today. Goal met. R North LCSW 04/27/2015 4:33 PM  Outcome: Progressing Pt denies AVH at this time and does not appear to be responding at this time

## 2015-04-28 NOTE — [facility] Group Notes (Signed)
[facility] Group Notes:  (Counselor/Nursing/MHT/Case Management/Adjunct)  04/28/2015 1:15PM  Type of Therapy:  Group Therapy  Participation Level:  Active  Participation Quality:  Appropriate  Affect:  Flat  Cognitive:  Oriented  Insight:  Improving  Engagement in Group:  Limited  Engagement in Therapy:  Limited  Modes of Intervention:  Discussion, Exploration and Socialization  Summary of Progress/Problems: The topic for group was balance in life.  Pt participated in the discussion about when their life was in balance and out of balance and how this feels.  Pt discussed ways to get back in balance and short term goals they can work on to get where they want to be. Gerald Stabs stayed for the entire group.  Continues to be invested in his treatment and sobriety.  Talked about being a part of an [facility] community in which there was much support and encouragement.  Plans to re-engage with them when he gets out of rehab.  Is grateful for all the help he gets from his ACT team as well.   [name] 04/28/2015 2:44 PM

## 2015-04-28 NOTE — Progress Notes (Signed)
[name] is quiet, well behaved and without complaints today. HE enjoyed sleeping late ( he didn't come for morning meds at 0800 when overhead paged to do so) and he is surprised when this nurse points this out to him at 1000 when he takes his meds.   A He is seen interacting in the dayroom right before lunch and he is calm. He completed his daily assessment sheet and on it he wrote he denied SI today and he rated his depression, hopelessness and anxiety " 5/5/5", respectively.    R He demonstrates minimal engagement into his poc but says hes glad hes here and that he wants to go onto a " longterm" rehab facility. Safety in place.

## 2015-04-28 NOTE — Progress Notes (Signed)
Patient in bed for most parts of the evening. He reported feeling rough. Denied SI/Hi and denied Hallucinations. He attended Karaoke. Writer encouraged and supported patient. Q 15 minute check continues as ordered to maintain safety.

## 2015-04-28 NOTE — [facility] Group Notes (Signed)
[facility] LCSW Aftercare Discharge Planning Group Note   04/28/2015 2:38 PM  Participation Quality:  Engaged  Mood/Affect:  Flat  Depression Rating:  denies  Anxiety Rating:  denies  Thoughts of Suicide:  No Will you contract for safety?   NA  Current AVH:  No  Plan for Discharge/Comments:  Sleep is good.  Appetite is good.  Glad to be getting help.  Hoping to get into treatment from here.  Transportation Means: ACT team  Supports: ACT team  [name]

## 2015-04-28 NOTE — Progress Notes (Signed)
D: Pt denies SI/HI/AVH. Pt is pleasant and cooperative. Pt avoids Probation officer, but will talk when   Engaged.    A: Pt was offered support and encouragement. Pt was given scheduled medications. Pt was encourage to attend groups. Q 15 minute checks were done for safety.   R:Pt attends groups and interacts well with peers and staff. Pt is taking medication. Pt has no complaints at this time .Pt receptive to treatment and safety maintained on unit.

## 2015-04-28 NOTE — Progress Notes (Addendum)
[facility] MD Progress Note  04/28/2015 12:54 PM [name]  MRN:  [id number] Subjective: Patient states " I am back on my medications and that helps , my CSW is trying to get me in to a treatment program for my alcohol.'  Objective; Patient seen and chart reviewed.Discussed patient with treatment team. Patient is a 54 year old African American male , who lives in [place] , separated , on SSD , presented after he was intoxicated with alcohol , cocaine and felt like he needed help with his addiction.Patient also with past hx of schizoaffective disorder.  Patient today seen , appeared to be calm , affect depressed, slow . Pt continues to have diaphoresis , tremors, nausea - but overall tolerating the Librium well.  Pt presented with AH - but currently denies it - reports he just had to get back on his medications and that has helped a lot.Pt however continues to feel that he will die out in the streets if he cannot get help with addiction. Pt denies any ADRs of medications.  Pt encouraged to attend groups . Pt per staff, has not had any disruptive issues noted on the unit. CSW will work on referral to substance abuse program. Pt continues to be very motivated to get help with his substance abuse.    Principal Problem: Schizoaffective disorder, depressive type Diagnosis:   Primary Psychiatric Diagnosis: Schizoaffective disorder, depressive type ,multiple episodes, currently in acute episode    Secondary Psychiatric Diagnosis: Alcohol use disorder, severe Alcohol withdrawal with perceptual disturbances, severe Cocaine use disorder, severe    Non Psychiatric Diagnosis: DM  HTN Hyperlipidemia  Patient Active Problem List   Diagnosis Date Noted  . Hyperlipidemia [E78.5] 04/28/2015  . Schizoaffective disorder, depressive type [F25.1] 04/27/2015  . Alcohol use disorder, severe, dependence [F10.20] 04/27/2015  . Cocaine use disorder, severe, dependence [F14.20] 04/27/2015  .  Alcohol withdrawal with perceptual disturbances [F10.232] 04/27/2015  . HTN (hypertension) [I10] 04/27/2015  . Gout [M10.9] 08/09/2013  . Diabetes mellitus [E11.9] 07/09/2012   Total Time spent with patient: 30 minutes   Past Medical History:  Past Medical History  Diagnosis Date  . Drug abuse   . Alcohol abuse   . Diabetes mellitus   . Uncontrolled hypertension   . Arthritis   . Gout   . DTs (delirium tremens)     history of   . Crack cocaine use   . Noncompliance with medication regimen   . Depression   . Schizophrenia     Past Surgical History  Procedure Laterality Date  . Mandible reconstruction     Family History:  Family History  Problem Relation Age of Onset  . Hypertension     Social History:  History  Alcohol Use  . 7.2 oz/week  . 12 Cans of beer per week    Comment: "10-12 24 oz beers daily"     History  Drug Use  . Yes  . Special: Cocaine, "Crack" cocaine    History   Social History  . Marital Status: Legally Separated    Spouse Name: N/A  . Number of Children: N/A  . Years of Education: N/A   Social History Main Topics  . Smoking status: Current Every Day Smoker -- 0.25 packs/day    Types: Cigarettes  . Smokeless tobacco: Never Used  . Alcohol Use: 7.2 oz/week    12 Cans of beer per week     Comment: "10-12 24 oz beers daily"  . Drug Use: Yes  Special: Cocaine, "Crack" cocaine  . Sexual Activity: Yes    Birth Control/ Protection: None   Other Topics Concern  . None   Social History Narrative   Additional History:    Sleep: Fair  Appetite:  Fair     Musculoskeletal: Strength & Muscle Tone: within normal limits Gait & Station: normal Patient leans: N/A   Psychiatric Specialty Exam: Physical Exam  Review of Systems  Constitutional: Positive for malaise/fatigue and diaphoresis.  Gastrointestinal: Positive for nausea.  Neurological: Positive for tremors.  Psychiatric/Behavioral: Positive for depression and substance  abuse. The patient is nervous/anxious.   All other systems reviewed and are negative.   Blood pressure 127/96, pulse 74, temperature 97.6 F (36.4 C), temperature source Oral, resp. rate 16, height 5\' 10"  (1.778 m), weight 93.895 kg (207 lb).Body mass index is 29.7 kg/(m^2).  General Appearance: Disheveled  Eye Sport and exercise psychologist::  Fair  Speech:  Slow  Volume:  Decreased  Mood:  Anxious and Depressed  Affect:  Flat  Thought Process:  Goal Directed  Orientation:  Full (Time, Place, and Person)  Thought Content:  Rumination  Suicidal Thoughts:  No but patient feels he will die out in the streets due to his addiction , if he cannot get help.  Homicidal Thoughts:  No  Memory:  Immediate;   Fair Recent;   Fair Remote;   Fair  Judgement:  Impaired  Insight:  Fair  Psychomotor Activity:  Tremor  Concentration:  Poor  Recall:  [employer] of Knowledge:Fair  Language: Fair  Akathisia:  No  Handed:  Right  AIMS (if indicated):     Assets:  Desire for Improvement Physical Health Social Support  ADL's:  Intact  Cognition: WNL  Sleep:  Number of Hours: 6.75     Current Medications: Current Facility-Administered Medications  Medication Dose Route Frequency Provider Last Rate Last Dose  . alum & mag hydroxide-simeth (MAALOX/MYLANTA) 200-200-20 MG/5ML suspension 30 mL  30 mL Oral Q4H PRN Delfin Gant, NP      . amLODipine (NORVASC) tablet 10 mg  10 mg Oral Daily Delfin Gant, NP   10 mg at 04/28/15 1201  . atenolol (TENORMIN) tablet 100 mg  100 mg Oral Daily Delfin Gant, NP   100 mg at 04/28/15 1200  . chlordiazePOXIDE (LIBRIUM) capsule 25 mg  25 mg Oral Q6H PRN Laverle Hobby, PA-C      . chlordiazePOXIDE (LIBRIUM) capsule 25 mg  25 mg Oral TID Laverle Hobby, PA-C   25 mg at 04/28/15 1202   Followed by  . [START ON 04/29/2015] chlordiazePOXIDE (LIBRIUM) capsule 25 mg  25 mg Oral BH-qamhs Spencer E Simon, PA-C       Followed by  . [START ON 05/01/2015] chlordiazePOXIDE  (LIBRIUM) capsule 25 mg  25 mg Oral Daily Laverle Hobby, PA-C      . cloNIDine (CATAPRES) tablet 0.2 mg  0.2 mg Oral BID Delfin Gant, NP   0.2 mg at 04/28/15 1200  . glimepiride (AMARYL) tablet 2 mg  2 mg Oral Q breakfast Delfin Gant, NP   2 mg at 04/28/15 1201  . hydrOXYzine (ATARAX/VISTARIL) tablet 25 mg  25 mg Oral Q6H PRN Laverle Hobby, PA-C   25 mg at 04/26/15 2223  . lisinopril (PRINIVIL,ZESTRIL) tablet 10 mg  10 mg Oral Daily Delfin Gant, NP   10 mg at 04/28/15 1200  . loperamide (IMODIUM) capsule 2-4 mg  2-4 mg Oral PRN Maurine Minister  Simon, PA-C      . magnesium hydroxide (MILK OF MAGNESIA) suspension 30 mL  30 mL Oral Daily PRN Delfin Gant, NP      . metFORMIN (GLUCOPHAGE) tablet 500 mg  500 mg Oral BID WC Delfin Gant, NP   500 mg at 04/28/15 1200  . multivitamin with minerals tablet 1 tablet  1 tablet Oral Daily Laverle Hobby, PA-C   1 tablet at 04/28/15 0800  . naproxen (NAPROSYN) tablet 375 mg  375 mg Oral BID WC Ursula Alert, MD   375 mg at 04/28/15 1201  . ondansetron (ZOFRAN-ODT) disintegrating tablet 4 mg  4 mg Oral Q6H PRN Laverle Hobby, PA-C      . paliperidone (INVEGA) 24 hr tablet 3 mg  3 mg Oral QHS Ursula Alert, MD   3 mg at 04/27/15 2136  . simvastatin (ZOCOR) tablet 20 mg  20 mg Oral q1800 Trusten Hume, MD      . thiamine (B-1) injection 100 mg  100 mg Intramuscular Once Laverle Hobby, PA-C   100 mg at 04/26/15 2115  . thiamine (VITAMIN B-1) tablet 100 mg  100 mg Oral Daily Laverle Hobby, PA-C   100 mg at 04/28/15 0800    Lab Results:  Results for orders placed or performed during the hospital encounter of 04/26/15 (from the past 48 hour(s))  Glucose, capillary     Status: Abnormal   Collection Time: 04/27/15  6:09 AM  Result Value Ref Range   Glucose-Capillary 164 (H) 65 - 99 mg/dL  Glucose, capillary     Status: Abnormal   Collection Time: 04/27/15 12:03 PM  Result Value Ref Range   Glucose-Capillary 172 (H) 65 - 99  mg/dL   Comment 1 Notify RN    Comment 2 Document in Chart   Glucose, capillary     Status: Abnormal   Collection Time: 04/27/15  5:05 PM  Result Value Ref Range   Glucose-Capillary 173 (H) 65 - 99 mg/dL   Comment 1 Notify RN    Comment 2 Document in Chart   Glucose, capillary     Status: Abnormal   Collection Time: 04/27/15  9:48 PM  Result Value Ref Range   Glucose-Capillary 291 (H) 65 - 99 mg/dL  Glucose, capillary     Status: Abnormal   Collection Time: 04/28/15  6:09 AM  Result Value Ref Range   Glucose-Capillary 191 (H) 65 - 99 mg/dL  Lipid panel     Status: Abnormal   Collection Time: 04/28/15  6:49 AM  Result Value Ref Range   Cholesterol 131 0 - 200 mg/dL   Triglycerides 296 (H) <150 mg/dL   HDL 48 >40 mg/dL   Total CHOL/HDL Ratio 2.7 RATIO   VLDL 59 (H) 0 - 40 mg/dL   LDL Cholesterol 24 0 - 99 mg/dL    Comment:        Total Cholesterol/HDL:CHD Risk Coronary Heart Disease Risk Table                     Men   Women  1/2 Average Risk   3.4   3.3  Average Risk       5.0   4.4  2 X Average Risk   9.6   7.1  3 X Average Risk  23.4   11.0        Use the calculated Patient Ratio above and the CHD Risk Table to determine the patient's CHD Risk.  ATP III CLASSIFICATION (LDL):  <100     mg/dL   Optimal  100-129  mg/dL   Near or Above                    Optimal  130-159  mg/dL   Borderline  160-189  mg/dL   High  >190     mg/dL   Very High Performed at [facility]   TSH     Status: None   Collection Time: 04/28/15  6:49 AM  Result Value Ref Range   TSH 3.901 0.350 - 4.500 uIU/mL    Comment: Performed at [facility]    Physical Findings: AIMS: Facial and Oral Movements Muscles of Facial Expression: None, normal Lips and Perioral Area: None, normal Jaw: None, normal Tongue: None, normal,Extremity Movements Upper (arms, wrists, hands, fingers): None, normal Lower (legs, knees, ankles, toes): None, normal, Trunk Movements Neck,  shoulders, hips: None, normal, Overall Severity Severity of abnormal movements (highest score from questions above): None, normal Incapacitation due to abnormal movements: None, normal Patient's awareness of abnormal movements (rate only patient's report): No Awareness, Dental Status Current problems with teeth and/or dentures?: No Does patient usually wear dentures?: No  CIWA:  CIWA-Ar Total: 0 COWS:     Assessment:  Patient is a 54 year old African American male , who lives in [place] , separated , on SSD , presented after he was intoxicated with alcohol , cocaine and felt like he needed help with his addiction.Patient also with past hx of schizoaffective disorder. Pt has been restarted on his medications, as well as CIWA protocol. Pt feels that he will die out in the street if he is discharged without getting his addiction treated. Pt wants to go to a substance abuse program.     Treatment Plan Summary: Daily contact with patient to assess and evaluate symptoms and progress in treatment and Medication management   For alcohol withdrawal sx: Will continue CIWA/Librium protocol. Reviewed CIWA/VS. Substance abuse counseling. Referral to residential substance abuse program.  For cocaine use disorder: Will refer to substance abuse program  Will provide counseling/mindfulness.  For schizoaffective disorder: Will continue Invega  3 mg po qhs for psychosis. Will continue Invega Sustenna 117 mg im -LAST DOSE (04/19/15). Continue Trazodone 100 mg po qhs prn for sleep.   Will restart home medications where needed- pls see MAR.  Will continue to monitor vitals ,medication compliance and treatment side effects while patient is here.  Will monitor for medical issues as well as call consult as needed.   Reviewed labs- Lipid panel - abnormal - will start Zocor 20 mg po qpm.. Place dietician consult. TSH -wnl  CSW will start working on disposition.  Patient to participate in  therapeutic milieu .    Medical Decision Making:  Review of Psycho-Social Stressors (1), Review or order clinical lab tests (1), Review and summation of old records (2), Review of Last Therapy Session (1), Review of Medication Regimen & Side Effects (2) and Review of New Medication or Change in Dosage (2)     Mayerly Kaman MD 04/28/2015, 12:54 PM

## 2015-04-29 LAB — HEMOGLOBIN A1C
HEMOGLOBIN A1C: 6.5 % — AB (ref 4.8–5.6)
MEAN PLASMA GLUCOSE: 140 mg/dL

## 2015-04-29 LAB — GLUCOSE, CAPILLARY
GLUCOSE-CAPILLARY: 206 mg/dL — AB (ref 65–99)
Glucose-Capillary: 292 mg/dL — ABNORMAL HIGH (ref 65–99)
Glucose-Capillary: 304 mg/dL — ABNORMAL HIGH (ref 65–99)
Glucose-Capillary: 260 mg/dL — ABNORMAL HIGH (ref 65–99)
Glucose-Capillary: 174 mg/dL — ABNORMAL HIGH (ref 65–99)

## 2015-04-29 LAB — PROLACTIN: PROLACTIN: 47.8 ng/mL — AB (ref 4.0–15.2)

## 2015-04-29 MED ORDER — ARIPIPRAZOLE 5 MG PO TABS
5.0000 mg | ORAL_TABLET | Freq: Every evening | ORAL | Status: DC
  Filled 2015-04-29 (×2): qty 1

## 2015-04-29 MED ORDER — INSULIN ASPART 100 UNIT/ML ~~LOC~~ SOLN
4.0000 [IU] | Freq: Three times a day (TID) | SUBCUTANEOUS | Status: DC
  Administered 2015-04-30 – 2015-05-03 (×10): 4 [IU] via SUBCUTANEOUS

## 2015-04-29 MED ORDER — INSULIN ASPART 100 UNIT/ML ~~LOC~~ SOLN
0.0000 [IU] | Freq: Three times a day (TID) | SUBCUTANEOUS | Status: DC
  Administered 2015-04-30 (×2): 3 [IU] via SUBCUTANEOUS
  Administered 2015-04-30: 8 [IU] via SUBCUTANEOUS
  Administered 2015-05-01: 3 [IU] via SUBCUTANEOUS
  Administered 2015-05-01: 8 [IU] via SUBCUTANEOUS
  Administered 2015-05-01: 5 [IU] via SUBCUTANEOUS

## 2015-04-29 MED ORDER — INSULIN ASPART 100 UNIT/ML ~~LOC~~ SOLN: 11.0000 [IU] | Freq: Once | SUBCUTANEOUS | Status: DC

## 2015-04-29 MED ORDER — CLONIDINE HCL 0.2 MG PO TABS
0.2000 mg | ORAL_TABLET | Freq: Once | ORAL | Status: AC
  Administered 2015-04-29: 0.2 mg via ORAL
  Filled 2015-04-29: qty 1
  Filled 2015-04-29: qty 2

## 2015-04-29 NOTE — Progress Notes (Signed)
Adult Psychoeducational Group Note  Date:  04/29/2015 Time:  9:07 PM  Group Topic/Focus:  Wrap-Up Group:   The focus of this group is to help patients review their daily goal of treatment and discuss progress on daily workbooks.  Participation Level:  Active  Participation Quality:  Appropriate  Affect:  Appropriate  Cognitive:  Appropriate  Insight: Appropriate  Engagement in Group:  Engaged  Modes of Intervention:  Discussion  Additional Comments: The patient expressed that he did not attended group today.The patient also said that he had no needs for  Tonight.  Nash Shearer 04/29/2015, 9:07 PM

## 2015-04-29 NOTE — BHH Group Notes (Signed)
[place] Group Notes:  (Nursing/MHT/Case Management/Adjunct)  Date:  04/29/2015  Time:  2:48 PM  Type of Therapy:  Nurse Education  Participation Level:  Minimal  Participation Quality:  Drowsy  Affect:  Flat  Cognitive:  Confused  Insight:  Lacking  Engagement in Group:  Limited  Modes of Intervention:  Discussion  Summary of Progress/Problems:  Lauralyn Primes 04/29/2015, 2:48 PM

## 2015-04-29 NOTE — B[facility] Group Notes (Signed)
[facility] Group Notes:  (Clinical Social Work)  04/29/2015  11:15-12:00PM  Summary of Progress/Problems:   The main focus of today's process group was to discuss patients' feelings related to being hospitalized, as well as the difference between "being" and "having" a mental health diagnosis.  It was agreed in general by the group that it would be preferable to avoid future hospitalizations, and we discussed means of doing that.  As a follow-up, problems with adhering to medication recommendations were discussed.  The patient expressed their primary feeling about being hospitalized is that he needed it, stating he needs to get back on meds.  He reported that when he is depressed he does not take his medications, and not taking them "twists my mind."  He stated he needs to also stay away from alcohol and drugs.  He did not have sufficient insight to report how he can stay away from them.  When asked about AA, he indicated a willingness to attend, but a complete lack of knowledge as to where they might be locally.  Type of Therapy:  Group Therapy - Process  Participation Level:  Active  Participation Quality:  Attentive  Affect:  Flat  Cognitive:  Appropriate  Insight:  Developing/Improving  Engagement in Therapy:  Developing/Improving  Modes of Intervention:  Exploration, Discussion  [name], LCSW 04/29/2015, 12:53 PM

## 2015-04-29 NOTE — Progress Notes (Signed)
[facility] MD Progress Note  04/29/2015 12:55 PM [name]  MRN:  [id number] Subjective: Patient states " I am back on my medications and doing well.   Objective; Patient seen and chart reviewed.Discussed patient with treatment team. Patient is a 54 year old African American male , who lives in [place] , separated , on SSD , presented after he was intoxicated with alcohol , cocaine and felt like he needed help with his addiction.Patient also with past hx of schizoaffective disorder.  Patient today seen , appeared to be calm , affect depressed, slow . Pt continues to have diaphoresis , but denies any other withdrawal sx- denies tremors, nausea - overall tolerating the Librium well.  Pt presented with AH - but currently denies it - reports he just had to get back on his medications and that has helped a lot.Pt however continues to feel that he will die out in the streets if he cannot get help with addiction. Pt denies any ADRs of medications.  Pt on labs - have hyperprolactinemia - pt currently stable on his current medication paliperidone - which can cause hyperprolactinemia and the reason for current admission was mostly for his substance use issues . Hence will not change his current medications - will add Abilify to help with PL level. Pt encouraged to attend groups . Pt per staff, has not had any disruptive issues noted on the unit. CSW will work on referral to substance abuse program. Pt continues to be very motivated to get help with his substance abuse.    Principal Problem: Schizoaffective disorder, depressive type Diagnosis:   Primary Psychiatric Diagnosis: Schizoaffective disorder, depressive type ,multiple episodes, currently in acute episode    Secondary Psychiatric Diagnosis: Alcohol use disorder, severe Alcohol withdrawal with perceptual disturbances, severe Cocaine use disorder, severe    Non Psychiatric Diagnosis: DM  HTN Hyperlipidemia  Patient Active  Problem List   Diagnosis Date Noted  . 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 Hyperlipidemia [E78.5] 04/28/2015  . Schizoaffective disorder, depressive type [F25.1] 04/27/2015  . Alcohol use disorder, severe, dependence [F10.20] 04/27/2015  . Cocaine use disorder, severe, dependence [F14.20] 04/27/2015  . Alcohol withdrawal with perceptual disturbances [F10.232] 04/27/2015  . HTN (hypertension) [I10] 04/27/2015  . Gout [M10.9] 08/09/2013  . Diabetes mellitus [E11.9] 07/09/2012   Total Time spent with patient: 30 minutes   Past Medical History:  Past Medical History  Diagnosis Date  . Drug abuse   . Alcohol abuse   . Diabetes mellitus   . Uncontrolled hypertension   . Arthritis   . Gout   . DTs (delirium tremens)     history of   . Crack cocaine use   . Noncompliance with medication regimen   . Depression   . Schizophrenia     Past Surgical History  Procedure Laterality Date  . Mandible reconstruction     Family History:  Family History  Problem Relation Age of Onset  . Hypertension     Social History:  History  Alcohol Use  . 7.2 oz/week  . 12 Cans of beer per week    Comment: "10-12 24 oz beers daily"     History  Drug Use  . Yes  . Special: Cocaine, "Crack" cocaine    History   Social History  . Marital Status: Legally Separated    Spouse Name: N/A  . Number of Children: N/A  . Years of Education: N/A   Social History Main Topics  . Smoking status: Current Every Day  Smoker -- 0.25 packs/day    Types: Cigarettes  . Smokeless tobacco: Never Used  . Alcohol Use: 7.2 oz/week    12 Cans of beer per week     Comment: "10-12 24 oz beers daily"  . Drug Use: Yes    Special: Cocaine, "Crack" cocaine  . Sexual Activity: Yes    Birth Control/ Protection: None   Other Topics Concern  . None   Social History Narrative   Additional History:    Sleep: Fair  Appetite:  Fair     Musculoskeletal: Strength & Muscle Tone: within normal  limits Gait & Station: normal Patient leans: N/A   Psychiatric Specialty Exam: Physical Exam  Review of Systems  Constitutional: Positive for diaphoresis.  Psychiatric/Behavioral: Positive for substance abuse. The patient is nervous/anxious.   All other systems reviewed and are negative.   Blood pressure 120/93, pulse 75, temperature 98 F (36.7 C), temperature source Oral, resp. rate 18, height 5\' 10"  (1.778 m), weight 93.895 kg (207 lb).Body mass index is 29.7 kg/(m^2).  General Appearance: Fairly Groomed  Engineer, water::  Fair  Speech:  Slow  Volume:  Decreased  Mood:  Anxious  Affect:  Flat  Thought Process:  Goal Directed  Orientation:  Full (Time, Place, and Person)  Thought Content:  Rumination  Suicidal Thoughts:  No but patient feels he will die out in the streets due to his addiction , if he cannot get help.  Homicidal Thoughts:  No  Memory:  Immediate;   Fair Recent;   Fair Remote;   Fair  Judgement:  Impaired  Insight:  Fair  Psychomotor Activity:  Decreased  Concentration:  Fair  Recall:  [employer] of Knowledge:Fair  Language: Fair  Akathisia:  No  Handed:  Right  AIMS (if indicated):     Assets:  Desire for Improvement Physical Health Social Support  ADL's:  Intact  Cognition: WNL  Sleep:  Number of Hours: 6.5     Current Medications: Current Facility-Administered Medications  Medication Dose Route Frequency Provider Last Rate Last Dose  . alum & mag hydroxide-simeth (MAALOX/MYLANTA) 200-200-20 MG/5ML suspension 30 mL  30 mL Oral Q4H PRN Delfin Gant, NP      . amLODipine (NORVASC) tablet 10 mg  10 mg Oral Daily Delfin Gant, NP   10 mg at 04/29/15 0912  . ARIPiprazole (ABILIFY) tablet 5 mg  5 mg Oral QPM Chizara Mena, MD      . atenolol (TENORMIN) tablet 100 mg  100 mg Oral Daily Delfin Gant, NP   100 mg at 04/29/15 0913  . chlordiazePOXIDE (LIBRIUM) capsule 25 mg  25 mg Oral Q6H PRN Laverle Hobby, PA-C      .  chlordiazePOXIDE (LIBRIUM) capsule 25 mg  25 mg Oral BH-qamhs Spencer E Simon, PA-C       Followed by  . [START ON 05/01/2015] chlordiazePOXIDE (LIBRIUM) capsule 25 mg  25 mg Oral Daily Laverle Hobby, PA-C      . cloNIDine (CATAPRES) tablet 0.2 mg  0.2 mg Oral BID Delfin Gant, NP   0.2 mg at 04/29/15 0913  . glimepiride (AMARYL) tablet 2 mg  2 mg Oral Q breakfast Delfin Gant, NP   2 mg at 04/29/15 0912  . hydrOXYzine (ATARAX/VISTARIL) tablet 25 mg  25 mg Oral Q6H PRN Laverle Hobby, PA-C   25 mg at 04/28/15 2133  . lisinopril (PRINIVIL,ZESTRIL) tablet 10 mg  10 mg Oral Daily Delfin Gant,  NP   10 mg at 04/29/15 0912  . loperamide (IMODIUM) capsule 2-4 mg  2-4 mg Oral PRN Laverle Hobby, PA-C      . magnesium hydroxide (MILK OF MAGNESIA) suspension 30 mL  30 mL Oral Daily PRN Delfin Gant, NP      . metFORMIN (GLUCOPHAGE) tablet 500 mg  500 mg Oral BID WC Delfin Gant, NP   500 mg at 04/29/15 0912  . multivitamin with minerals tablet 1 tablet  1 tablet Oral Daily Laverle Hobby, PA-C   1 tablet at 04/29/15 0913  . naproxen (NAPROSYN) tablet 375 mg  375 mg Oral BID WC Ursula Alert, MD   375 mg at 04/29/15 0913  . ondansetron (ZOFRAN-ODT) disintegrating tablet 4 mg  4 mg Oral Q6H PRN Laverle Hobby, PA-C      . paliperidone (INVEGA) 24 hr tablet 3 mg  3 mg Oral QHS Ursula Alert, MD   3 mg at 04/28/15 2133  . simvastatin (ZOCOR) tablet 20 mg  20 mg Oral q1800 Ursula Alert, MD   20 mg at 04/28/15 1820  . thiamine (B-1) injection 100 mg  100 mg Intramuscular Once Laverle Hobby, PA-C   100 mg at 04/26/15 2115  . thiamine (VITAMIN B-1) tablet 100 mg  100 mg Oral Daily Laverle Hobby, PA-C   100 mg at 04/29/15 [id number]    Lab Results:  Results for orders placed or performed during the hospital encounter of 04/26/15 (from the past 48 hour(s))  Glucose, capillary     Status: Abnormal   Collection Time: 04/27/15  5:05 PM  Result Value Ref Range    Glucose-Capillary 173 (H) 65 - 99 mg/dL   Comment 1 Notify RN    Comment 2 Document in Chart   Glucose, capillary     Status: Abnormal   Collection Time: 04/27/15  9:48 PM  Result Value Ref Range   Glucose-Capillary 291 (H) 65 - 99 mg/dL  Glucose, capillary     Status: Abnormal   Collection Time: 04/28/15  6:09 AM  Result Value Ref Range   Glucose-Capillary 191 (H) 65 - 99 mg/dL  Lipid panel     Status: Abnormal   Collection Time: 04/28/15  6:49 AM  Result Value Ref Range   Cholesterol 131 0 - 200 mg/dL   Triglycerides 296 (H) <150 mg/dL   HDL 48 >40 mg/dL   Total CHOL/HDL Ratio 2.7 RATIO   VLDL 59 (H) 0 - 40 mg/dL   LDL Cholesterol 24 0 - 99 mg/dL    Comment:        Total Cholesterol/HDL:CHD Risk Coronary Heart Disease Risk Table                     Men   Women  1/2 Average Risk   3.4   3.3  Average Risk       5.0   4.4  2 X Average Risk   9.6   7.1  3 X Average Risk  23.4   11.0        Use the calculated Patient Ratio above and the CHD Risk Table to determine the patient's CHD Risk.        ATP III CLASSIFICATION (LDL):  <100     mg/dL   Optimal  100-129  mg/dL   Near or Above                    Optimal  130-159  mg/dL   Borderline  160-189  mg/dL   High  >190     mg/dL   Very High Performed at [facility]   Hemoglobin A1c     Status: Abnormal   Collection Time: 04/28/15  6:49 AM  Result Value Ref Range   Hgb A1c MFr Bld 6.5 (H) 4.8 - 5.6 %    Comment: (NOTE)         Pre-diabetes: 5.7 - 6.4         Diabetes: >6.4         Glycemic control for adults with diabetes: <7.0    Mean Plasma Glucose 140 mg/dL    Comment: (NOTE) Performed At: [facility] [place], [place] [id number] Lindon Romp MD [id number] Performed at [facility]   TSH     Status: None   Collection Time: 04/28/15  6:49 AM  Result Value Ref Range   TSH 3.901 0.350 - 4.500 uIU/mL    Comment: Performed at [facility]   Prolactin     Status: Abnormal   Collection Time: 04/28/15  6:49 AM  Result Value Ref Range   Prolactin 47.8 (H) 4.0 - 15.2 ng/mL    Comment: (NOTE) Performed At: [facility] [place], [place] [id number] Lindon Romp MD [id number] Performed at [facility]   Glucose, capillary     Status: Abnormal   Collection Time: 04/28/15  4:49 PM  Result Value Ref Range   Glucose-Capillary 232 (H) 65 - 99 mg/dL  Glucose, capillary     Status: Abnormal   Collection Time: 04/29/15  5:56 AM  Result Value Ref Range   Glucose-Capillary 206 (H) 65 - 99 mg/dL    Physical Findings: AIMS: Facial and Oral Movements Muscles of Facial Expression: None, normal Lips and Perioral Area: None, normal Jaw: None, normal Tongue: None, normal,Extremity Movements Upper (arms, wrists, hands, fingers): None, normal Lower (legs, knees, ankles, toes): None, normal, Trunk Movements Neck, shoulders, hips: None, normal, Overall Severity Severity of abnormal movements (highest score from questions above): None, normal Incapacitation due to abnormal movements: None, normal Patient's awareness of abnormal movements (rate only patient's report): No Awareness, Dental Status Current problems with teeth and/or dentures?: No Does patient usually wear dentures?: No  CIWA:  CIWA-Ar Total: 1 COWS:     Assessment:  Patient is a 54 year old African American male , who lives in [place] , separated , on SSD , presented after he was intoxicated with alcohol , cocaine and felt like he needed help with his addiction.Patient also with past hx of schizoaffective disorder. Pt has been restarted on his medications, as well as CIWA protocol. Pt feels that he will die out in the street if he is discharged without getting his addiction treated. Pt wants to go to a substance abuse program.     Treatment Plan Summary: Daily contact with patient to assess and evaluate symptoms and progress  in treatment and Medication management   For alcohol withdrawal sx: Will continue CIWA/Librium protocol. Reviewed CIWA/VS. Substance abuse counseling. Referral to residential substance abuse program.  For cocaine use disorder: Will refer to substance abuse program  Will provide counseling/mindfulness.  For schizoaffective disorder: Will continue Invega  3 mg po qhs for psychosis. Will continue Invega Sustenna 117 mg im -LAST DOSE (04/19/15). Will add Abilify 5 mg po daily for hyperprolactinemia. Will monitor this closely. Continue Trazodone 100 mg po qhs  prn for sleep.   Will restart home medications where needed- pls see MAR.  Will continue to monitor vitals ,medication compliance and treatment side effects while patient is here.  Will monitor for medical issues as well as call consult as needed.   Reviewed labs- Lipid panel - abnormal - will continue Zocor 20 mg po qpm.. Place dietician consult. Patient also with elevated Hba1c- Dietician consult.  Pt also with hyperprolactinemia- see above.  CSW will start working on disposition.  Patient to participate in therapeutic milieu .    Medical Decision Making:  Review of Psycho-Social Stressors (1), Review or order clinical lab tests (1), Review and summation of old records (2), Review of Last Therapy Session (1), Review of Medication Regimen & Side Effects (2) and Review of New Medication or Change in Dosage (2)     [name] Churchwell MD 04/29/2015, 12:55 PM

## 2015-04-29 NOTE — Progress Notes (Addendum)
[name] has been seen out in the milieu today. HE is quiet. HE moves slowly and says he feels " over medicated " today. HE completed his morning assessment and on it he wrote  He denies  SI today and he rates his depression, hopelessness and  Anxiety " 6/3/3", respectively. A HE attends his groups...but is groggy frequently through out the day and is encouraged to rest often. R He repeats over and over" I gotta get off these drugs...they're gonna killl me...". Safety is in place. Pt CBG at 1715 is 292 with recheck 304. NP MA notified and pt given one time dose of 11 units novolog subq. Pt cbg reheck at 1 hr later 260 and pt more alert ..speaking clearer and saying  " I gotta stay away from that ice cream".... This Probation officer called NP and NP starting pt on sliding scale and cbg's and pt notified. POC updated and safety cont.

## 2015-04-29 NOTE — Progress Notes (Signed)
D: Pt denies SI/HI/AVH. Pt is pleasant and cooperative. Pt  Stayed in bed most of the evening. Pt stated he felt bad earlier when his blood sugar went up. Pt stated " I'm ok now".   A: Pt was offered support and encouragement. Pt was given scheduled medications. Pt was encourage to attend groups. Q 15 minute checks were done for safety.   R:Pt attends groups and interacts well with peers and staff. Pt is taking medication. Pt has no complaints at this time .Pt receptive to treatment and safety maintained on unit.

## 2015-04-30 LAB — GLUCOSE, CAPILLARY
GLUCOSE-CAPILLARY: 298 mg/dL — AB (ref 65–99)
Glucose-Capillary: 185 mg/dL — ABNORMAL HIGH (ref 65–99)
Glucose-Capillary: 180 mg/dL — ABNORMAL HIGH (ref 65–99)
Glucose-Capillary: 244 mg/dL — ABNORMAL HIGH (ref 65–99)

## 2015-04-30 MED ORDER — ARIPIPRAZOLE 10 MG PO TABS
10.0000 mg | ORAL_TABLET | Freq: Every evening | ORAL | Status: DC
  Administered 2015-04-30 – 2015-05-01 (×3): 10 mg via ORAL
  Filled 2015-04-30 (×3): qty 1

## 2015-04-30 NOTE — Progress Notes (Signed)
[facility] MD Progress Note  04/30/2015 3:53 PM [name]  MRN:  [id number] Subjective: Patient states " I am a bit dizzy today .'   Objective; Patient seen and chart reviewed.Discussed patient with treatment team. Patient is a 54 year old African American male , who lives in [place] , separated , on SSD , presented after he was intoxicated with alcohol , cocaine and felt like he needed help with his addiction.Patient also with past hx of schizoaffective disorder.  Patient today seen , appeared to be calm , affect depressed, slow . Pt  Today denies any withdrawal sx - no tremors , sweats , nausea noted. Patient has been tolerating his medications well - except for some drowsiness which could be from being on libirum; VS ,CBG reviewed - wnl.  Pt on labs - have hyperprolactinemia - pt currently stable on his current medication paliperidone - which can cause hyperprolactinemia and the reason for current admission was mostly for his substance use issues . Hence will not change his current medications - added Abilify to help with PL level. Pt encouraged to attend groups . Pt per staff, has not had any disruptive issues noted on the unit. CSW will work on referral to substance abuse program. Pt continues to be very motivated to get help with his substance abuse.    Principal Problem: Schizoaffective disorder, depressive type Diagnosis:   Primary Psychiatric Diagnosis: Schizoaffective disorder, depressive type ,multiple episodes, currently in acute episode    Secondary Psychiatric Diagnosis: Alcohol use disorder, severe Alcohol withdrawal with perceptual disturbances, severe Cocaine use disorder, severe    Non Psychiatric Diagnosis: DM  HTN Hyperlipidemia  Patient Active Problem List   Diagnosis Date Noted  . 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 Hyperlipidemia [E78.5] 04/28/2015  . Schizoaffective disorder, depressive type [F25.1] 04/27/2015  . Alcohol use disorder,  severe, dependence [F10.20] 04/27/2015  . Cocaine use disorder, severe, dependence [F14.20] 04/27/2015  . Alcohol withdrawal with perceptual disturbances [F10.232] 04/27/2015  . HTN (hypertension) [I10] 04/27/2015  . Gout [M10.9] 08/09/2013  . Diabetes mellitus [E11.9] 07/09/2012   Total Time spent with patient: 30 minutes   Past Medical History:  Past Medical History  Diagnosis Date  . Drug abuse   . Alcohol abuse   . Diabetes mellitus   . Uncontrolled hypertension   . Arthritis   . Gout   . DTs (delirium tremens)     history of   . Crack cocaine use   . Noncompliance with medication regimen   . Depression   . Schizophrenia     Past Surgical History  Procedure Laterality Date  . Mandible reconstruction     Family History:  Family History  Problem Relation Age of Onset  . Hypertension     Social History:  History  Alcohol Use  . 7.2 oz/week  . 12 Cans of beer per week    Comment: "10-12 24 oz beers daily"     History  Drug Use  . Yes  . Special: Cocaine, "Crack" cocaine    History   Social History  . Marital Status: Legally Separated    Spouse Name: N/A  . Number of Children: N/A  . Years of Education: N/A   Social History Main Topics  . Smoking status: Current Every Day Smoker -- 0.25 packs/day    Types: Cigarettes  . Smokeless tobacco: Never Used  . Alcohol Use: 7.2 oz/week    12 Cans of beer per week     Comment: "10-12 24  oz beers daily"  . Drug Use: Yes    Special: Cocaine, "Crack" cocaine  . Sexual Activity: Yes    Birth Control/ Protection: None   Other Topics Concern  . None   Social History Narrative   Additional History:    Sleep: Fair  Appetite:  Fair     Musculoskeletal: Strength & Muscle Tone: within normal limits Gait & Station: normal Patient leans: N/A   Psychiatric Specialty Exam: Physical Exam  Review of Systems  Neurological: Positive for dizziness.  Psychiatric/Behavioral: Positive for substance abuse. The  patient is nervous/anxious.   All other systems reviewed and are negative.   Blood pressure 147/87, pulse 79, temperature 97.4 F (36.3 C), temperature source Oral, resp. rate 20, height 5\' 10"  (1.778 m), weight 93.895 kg (207 lb).Body mass index is 29.7 kg/(m^2).  General Appearance: Fairly Groomed  Engineer, water::  Fair  Speech:  Slow  Volume:  Decreased  Mood:  Anxious  Affect:  Flat  Thought Process:  Goal Directed  Orientation:  Full (Time, Place, and Person)  Thought Content:  Rumination  Suicidal Thoughts:  No but patient feels he will die out in the streets due to his addiction , if he cannot get help.  Homicidal Thoughts:  No  Memory:  Immediate;   Fair Recent;   Fair Remote;   Fair  Judgement:  Fair  Insight:  Fair  Psychomotor Activity:  Decreased  Concentration:  Fair  Recall:  [employer] of Knowledge:Fair  Language: Fair  Akathisia:  No  Handed:  Right  AIMS (if indicated):     Assets:  Desire for Improvement Physical Health Social Support  ADL's:  Intact  Cognition: WNL  Sleep:  Number of Hours: 6.75     Current Medications: Current Facility-Administered Medications  Medication Dose Route Frequency Provider Last Rate Last Dose  . alum & mag hydroxide-simeth (MAALOX/MYLANTA) 200-200-20 MG/5ML suspension 30 mL  30 mL Oral Q4H PRN Delfin Gant, NP      . amLODipine (NORVASC) tablet 10 mg  10 mg Oral Daily Delfin Gant, NP   10 mg at 04/30/15 0901  . ARIPiprazole (ABILIFY) tablet 10 mg  10 mg Oral QPM Chelcea Zahn, MD      . atenolol (TENORMIN) tablet 100 mg  100 mg Oral Daily Delfin Gant, NP   100 mg at 04/30/15 0901  . chlordiazePOXIDE (LIBRIUM) capsule 25 mg  25 mg Oral BH-qamhs Laverle Hobby, PA-C   25 mg at 04/29/15 2209   Followed by  . [START ON 05/01/2015] chlordiazePOXIDE (LIBRIUM) capsule 25 mg  25 mg Oral Daily Laverle Hobby, PA-C      . cloNIDine (CATAPRES) tablet 0.2 mg  0.2 mg Oral BID Delfin Gant, NP   0.2 mg at  04/30/15 0900  . glimepiride (AMARYL) tablet 2 mg  2 mg Oral Q breakfast Delfin Gant, NP   2 mg at 04/30/15 0900  . insulin aspart (novoLOG) injection 0-15 Units  0-15 Units Subcutaneous TID WC Kerrie Buffalo, NP   3 Units at 04/30/15 1213  . insulin aspart (novoLOG) injection 11 Units  11 Units Subcutaneous Once Kerrie Buffalo, NP      . insulin aspart (novoLOG) injection 4 Units  4 Units Subcutaneous TID WC Kerrie Buffalo, NP   4 Units at 04/30/15 1213  . lisinopril (PRINIVIL,ZESTRIL) tablet 10 mg  10 mg Oral Daily Delfin Gant, NP   10 mg at 04/30/15 0901  . magnesium  hydroxide (MILK OF MAGNESIA) suspension 30 mL  30 mL Oral Daily PRN Delfin Gant, NP      . metFORMIN (GLUCOPHAGE) tablet 500 mg  500 mg Oral BID WC Delfin Gant, NP   500 mg at 04/30/15 0901  . multivitamin with minerals tablet 1 tablet  1 tablet Oral Daily Laverle Hobby, PA-C   1 tablet at 04/30/15 0902  . paliperidone (INVEGA) 24 hr tablet 3 mg  3 mg Oral QHS Ursula Alert, MD   3 mg at 04/29/15 2209  . simvastatin (ZOCOR) tablet 20 mg  20 mg Oral q1800 Ursula Alert, MD   20 mg at 04/28/15 1820  . thiamine (B-1) injection 100 mg  100 mg Intramuscular Once Laverle Hobby, PA-C   100 mg at 04/26/15 2115  . thiamine (VITAMIN B-1) tablet 100 mg  100 mg Oral Daily Laverle Hobby, PA-C   100 mg at 04/30/15 0900    Lab Results:  Results for orders placed or performed during the hospital encounter of 04/26/15 (from the past 48 hour(s))  Glucose, capillary     Status: Abnormal   Collection Time: 04/28/15  4:49 PM  Result Value Ref Range   Glucose-Capillary 232 (H) 65 - 99 mg/dL  Glucose, capillary     Status: Abnormal   Collection Time: 04/29/15  5:56 AM  Result Value Ref Range   Glucose-Capillary 206 (H) 65 - 99 mg/dL  Glucose, capillary     Status: Abnormal   Collection Time: 04/29/15  5:14 PM  Result Value Ref Range   Glucose-Capillary 304 (H) 65 - 99 mg/dL  Glucose, capillary     Status:  Abnormal   Collection Time: 04/29/15  5:23 PM  Result Value Ref Range   Glucose-Capillary 292 (H) 65 - 99 mg/dL  Glucose, capillary     Status: Abnormal   Collection Time: 04/29/15  6:31 PM  Result Value Ref Range   Glucose-Capillary 260 (H) 65 - 99 mg/dL  Glucose, capillary     Status: Abnormal   Collection Time: 04/29/15  8:34 PM  Result Value Ref Range   Glucose-Capillary 174 (H) 65 - 99 mg/dL  Glucose, capillary     Status: Abnormal   Collection Time: 04/30/15  6:02 AM  Result Value Ref Range   Glucose-Capillary 185 (H) 65 - 99 mg/dL  Glucose, capillary     Status: Abnormal   Collection Time: 04/30/15 12:11 PM  Result Value Ref Range   Glucose-Capillary 180 (H) 65 - 99 mg/dL    Physical Findings: AIMS: Facial and Oral Movements Muscles of Facial Expression: None, normal Lips and Perioral Area: None, normal Jaw: None, normal Tongue: None, normal,Extremity Movements Upper (arms, wrists, hands, fingers): None, normal Lower (legs, knees, ankles, toes): None, normal, Trunk Movements Neck, shoulders, hips: None, normal, Overall Severity Severity of abnormal movements (highest score from questions above): None, normal Incapacitation due to abnormal movements: None, normal Patient's awareness of abnormal movements (rate only patient's report): No Awareness, Dental Status Current problems with teeth and/or dentures?: No Does patient usually wear dentures?: No  CIWA:  CIWA-Ar Total: 0 COWS:     Assessment:  Patient is a 54 year old African American male , who lives in [place] , separated , on SSD , presented after he was intoxicated with alcohol , cocaine and felt like he needed help with his addiction.Patient also with past hx of schizoaffective disorder. Pt has been restarted on his medications, as well as CIWA protocol.  Pt feels that he will die out in the street if he is discharged without getting his addiction treated. Pt wants to go to a substance abuse  program.     Treatment Plan Summary: Daily contact with patient to assess and evaluate symptoms and progress in treatment and Medication management   For alcohol withdrawal sx: Will continue CIWA/Librium protocol. Reviewed CIWA/VS. Substance abuse counseling. Referral to residential substance abuse program.  For cocaine use disorder: Will refer to substance abuse program  Will provide counseling/mindfulness.  For schizoaffective disorder: Will continue Invega 3 mg po daily Will continue Invega Sustenna 117 mg im -LAST DOSE (04/19/15). Will add Abilify 5 mg po daily for hyperprolactinemia. Will monitor this closely. Continue Trazodone 100 mg po qhs prn for sleep.   Will restart home medications where needed- pls see MAR.  Will continue to monitor vitals ,medication compliance and treatment side effects while patient is here.  Will monitor for medical issues as well as call consult as needed.   Reviewed labs- Lipid panel - abnormal - will continue Zocor 20 mg po qpm.. Place dietician consult. Patient also with elevated Hba1c- Dietician consult.  Pt also with hyperprolactinemia- see above.  CSW will start working on disposition.  Patient to participate in therapeutic milieu .    Medical Decision Making:  Review of Psycho-Social Stressors (1), Review or order clinical lab tests (1), Review and summation of old records (2), Review of Last Therapy Session (1), Review of Medication Regimen & Side Effects (2) and Review of New Medication or Change in Dosage (2)     Bowdy Bair MD 04/30/2015, 3:53 PM

## 2015-04-30 NOTE — BHH Group Notes (Signed)
[facility] Group Notes:  (Clinical Social Work)  04/30/2015  [facility] Group Notes:  (Clinical Social Work)  04/30/2015  11:00AM-12:00PM  Summary of Progress/Problems:  The main focus of today's process group was to listen to a variety of genres of music and to identify that different types of music provoke different responses.  The patient then was able to identify personally what was soothing for them, as well as energizing.  Handouts were used to record feelings evoked, as well as how patient can personally use this knowledge in sleep habits, with depression, and with other symptoms.  The patient expressed understanding of concepts, as well as knowledge of how each type of music affected him/her and how this can be used at home as a wellness/recovery tool.  He kept his eyes closed throughout group but said he was listening, not sleeping.  At one song, he excused himself and said he would return when the song was finished.  Type of Therapy:  Music Therapy   Participation Level:  Active  Participation Quality:  Attentive and Sharing  Affect:  Blunted  Cognitive:  Oriented  Insight:  Engaged  Engagement in Therapy:  Engaged  Modes of Intervention:   Activity, Exploration  [name], LCSW 04/30/2015

## 2015-04-30 NOTE — Progress Notes (Signed)
D Pt is quiet, chooses to stay in his bed this morning and wants to " be lazy " today. He is observed eating 2 ice creams, gold fish crackers and cookies this afternoon, in between lunch and supper. He completed his daily assessment and on it he wrote he denied  SI and he rated his depression, hopelessness and anxiety " 2/3/3", respectively.   A He complained of sleepiness, demonstrated slurred speech and said he was " weak" at 1645 today. CBG checked and was 298. He was  Given novolog sliding scale insulin 4 units meal coverage plus 8 units sliding scale per MD order. Pt tolerated without diff.   R Safety is in place and poc cont.

## 2015-04-30 NOTE — Progress Notes (Signed)
Adult Psychoeducational Group Note  Date:  04/30/2015 Time:  9:42 PM  Group Topic/Focus:  Wrap-Up Group:   The focus of this group is to help patients review their daily goal of treatment and discuss progress on daily workbooks.  Participation Level:  Active  Participation Quality:  Appropriate  Affect:  Appropriate  Cognitive:  Appropriate  Insight: Appropriate  Engagement in Group:  Engaged  Modes of Intervention:  Discussion  Additional Comments: The patient expressed that he attended Music Therapy.The patient also said that music is apart of his life.  Nash Shearer 04/30/2015, 9:42 PM

## 2015-05-01 DIAGNOSIS — F10232 Alcohol dependence with withdrawal with perceptual disturbance: Secondary | ICD-10-CM

## 2015-05-01 DIAGNOSIS — F1420 Cocaine dependence, uncomplicated: Secondary | ICD-10-CM

## 2015-05-01 DIAGNOSIS — F251 Schizoaffective disorder, depressive type: Principal | ICD-10-CM

## 2015-05-01 LAB — GLUCOSE, CAPILLARY
GLUCOSE-CAPILLARY: 240 mg/dL — AB (ref 65–99)
Glucose-Capillary: 290 mg/dL — ABNORMAL HIGH (ref 65–99)
Glucose-Capillary: 176 mg/dL — ABNORMAL HIGH (ref 65–99)
Glucose-Capillary: 291 mg/dL — ABNORMAL HIGH (ref 65–99)
Glucose-Capillary: 245 mg/dL — ABNORMAL HIGH (ref 65–99)

## 2015-05-01 MED ORDER — INSULIN ASPART 100 UNIT/ML ~~LOC~~ SOLN
0.0000 [IU] | Freq: Three times a day (TID) | SUBCUTANEOUS | Status: DC
  Administered 2015-05-02 (×3): 7 [IU] via SUBCUTANEOUS
  Administered 2015-05-03: 4 [IU] via SUBCUTANEOUS

## 2015-05-01 MED ORDER — HYDRALAZINE HCL 10 MG PO TABS
10.0000 mg | ORAL_TABLET | Freq: Four times a day (QID) | ORAL | Status: DC | PRN
Start: 2015-05-01 — End: 2015-05-03
  Administered 2015-05-01 – 2015-05-02 (×3): 10 mg via ORAL
  Filled 2015-05-01 (×3): qty 1

## 2015-05-01 MED ORDER — ARIPIPRAZOLE 5 MG PO TABS
5.0000 mg | ORAL_TABLET | Freq: Every evening | ORAL | Status: DC
  Filled 2015-05-01 (×2): qty 1

## 2015-05-01 MED ORDER — CLONIDINE HCL 0.1 MG PO TABS
0.3000 mg | ORAL_TABLET | Freq: Two times a day (BID) | ORAL | Status: DC
  Administered 2015-05-02 (×2): 0.3 mg via ORAL
  Filled 2015-05-01: qty 84
  Filled 2015-05-01 (×2): qty 1
  Filled 2015-05-01: qty 84
  Filled 2015-05-01: qty 1
  Filled 2015-05-01: qty 3
  Filled 2015-05-01 (×2): qty 1

## 2015-05-01 MED ORDER — INSULIN ASPART 100 UNIT/ML ~~LOC~~ SOLN
0.0000 [IU] | Freq: Every day | SUBCUTANEOUS | Status: DC
  Administered 2015-05-01 – 2015-05-02 (×2): 2 [IU] via SUBCUTANEOUS

## 2015-05-01 MED ORDER — HYDRALAZINE HCL 10 MG PO TABS: 10.0000 mg | ORAL_TABLET | Freq: Four times a day (QID) | ORAL | Status: DC | PRN

## 2015-05-01 NOTE — Plan of Care (Signed)
Problem: Alteration in mood & ability to function due to Goal: LTG-Pt reports reduction in suicidal thoughts (Patient reports reduction in suicidal thoughts and is able to verbalize a safety plan for whenever patient is feeling suicidal)  Outcome: Progressing Patient denies suicidal thoughts .

## 2015-05-01 NOTE — Plan of Care (Signed)
Problem: Food- and Nutrition-Related Knowledge Deficit (NB-1.1) Goal: Nutrition education Formal process to instruct or train a patient/client in a skill or to impart knowledge to help patients/clients voluntarily manage or modify food choices and eating behavior to maintain or improve health. Outcome: Completed/Met Date Met:  05/01/15 Nutrition Education Note  RD consulted for nutrition education regarding hyperlipidemia.   Lipid Panel     Component Value Date/Time    CHOL 131 04/28/2015 0649    TRIG 296* 04/28/2015 0649    HDL 48 04/28/2015 0649    CHOLHDL 2.7 04/28/2015 0649    VLDL 59* 04/28/2015 0649    LDLCALC 24 04/28/2015 0649   Lab Results  Component Value Date    HGBA1C 6.5* 04/28/2015   RD provided "High Triglycerides Nutrition Therapy" handout from the Academy of [employer]etics. Reviewed patient's dietary recall. Provided examples on ways to decrease sugar and fat intake in diet. Discouraged intake of processed foods and sugar sweetened beverages. Encouraged fresh fruits and vegetables as well as whole grain sources of carbohydrates to maximize fiber intake. Teach back method used.  Expect fair compliance. Pt seemed distracted during education.  Body mass index is 29.7 kg/(m^2). Pt meets criteria for overweight based on current BMI.  Diet Order: Diet Carb Modified Fluid consistency:: Thin; Room service appropriate?: Yes Pt is also offered choice of unit snacks mid-morning and mid-afternoon.  Pt is eating as desired.   Labs and medications reviewed. No further nutrition interventions warranted at this time. RD contact information provided. If additional nutrition issues arise, please re-consult RD.  Clayton Bibles, MS, RD, LDN Pager: [phone] After Hours Pager: [phone]

## 2015-05-01 NOTE — Plan of Care (Signed)
Problem: Alteration in thought process Goal: STG-Patient does not respond to command hallucinations Outcome: Progressing Pt denies any AVH. Pt does not appear to be responding to any internal stimuli at this time.

## 2015-05-01 NOTE — BHH Group Notes (Signed)
[facility] LCSW Aftercare Discharge Planning Group Note   05/01/2015 10:18 AM  Participation Quality:  Engaged  Mood/Affect:  Lethargic  Depression Rating:  denies  Anxiety Rating:  denies  Thoughts of Suicide:  No Will you contract for safety?   NA  Current AVH:  No  Plan for Discharge/Comments:  "I'm doing OK.  Have you heard anything from the rehab place?"  Pt is goal oriented and committed to sobriety.  He is acting lethargic and slurring his words.  Medication adjustments are necessary in order for him to be able to go to rehab and meaningfully participate.  Transportation Means:   Supports: [employer] [facility]  [place], [place] B

## 2015-05-01 NOTE — BHH Group Notes (Signed)
[facility] LCSW Group Therapy  05/01/2015 1:15 pm  Type of Therapy: Process Group Therapy  Participation Level:  Invited.  Did not attend.  Summary of Progress/Problems: Today's group addressed the issue of overcoming obstacles.  Patients were asked to identify their biggest obstacle post d/c that stands in the way of their on-going success, and then problem solve as to how to manage this.    [name] 05/01/2015   4:19 PM

## 2015-05-01 NOTE — Progress Notes (Signed)
D: "I feel pretty ok." Patient denies SI/HI. He also denies AVH. Patient denies any withdrawal symptoms. Reports he ate 100% of lunch. Observed in the dayroom throughout the shift. Movements slowed.    A: Special checks q 15 mins in place for safety. Medication administered per MD order. Encouragement and counseling provided.    R: Safety maintained.

## 2015-05-01 NOTE — Progress Notes (Signed)
Adult Psychoeducational Group Note  Date:  05/01/2015 Time:  10:08 PM  Group Topic/Focus:  Wrap-Up Group:   The focus of this group is to help patients review their daily goal of treatment and discuss progress on daily workbooks.  Participation Level:  Minimal  Participation Quality:  Drowsy  Affect:  Flat  Cognitive:  Lacking  Insight: Limited  Engagement in Group:  Limited  Modes of Intervention:  Socialization and Support  Additional Comments:  Patient attended and participated in group tonight. Patient reports that he has been trying to stay focus. The medication that he takes for his blood pressure is making him sleep a lot. He advised that he did not went down to the cafeteria for his meals due to feeling tired and sleepy. The patient reports that he attends his group,however, he cannot remember too much that occur due to being heavely medicated. He stated that he has been having dreams that he is in the jungle. He has been trying his best to stay awake because he don't like the feeling of being knocked out  Debe Coder 05/01/2015, 10:08 PM

## 2015-05-01 NOTE — Progress Notes (Signed)
D: Pt is present within the milieu but with minimal interactions with others. Pt reports feeling "weak". "I rather not take my meds if this is how I'm going to feel". Writer verbalized the importance of taking his prescribed medication with focus on his BP meds for his continued HTN.  Pt denied any additional physical symptoms. Pt was alert and oriented X4. Pt was negative for any SI/HI/AVH. Pt was encouraged to seek staff if he began to feel worst.  A: Writer administered scheduled medications to pt, per MD orders. Continued support and availability as needed was extended to this pt. Staff continue to monitor pt with q56min checks.  R: No adverse drug reactions noted. Pt receptive to treatment. Pt remains safe at this time.

## 2015-05-01 NOTE — Progress Notes (Addendum)
Patient ID: [name], male   DOB: Oct 08, 1961, 54 y.o.   MRN: [id number] [facility] MD Progress Note  05/01/2015 6:05 PM [name]  MRN:  [id number] Subjective- Patient reports that he is feeling better. He reports medications are making him feel drowsy, " slowed down".  Objective- Patient seen and chart reviewed.Discussed patient with treatment team.  Patient is a 54 year old male , who has history of alcohol and cocaine dependencies, as well as history of schizoaffective disorder. Patient reports he is feeling better and is less depressed. He is minimizing any current withdrawal symptoms and does not appear to be in any acute distress or discomfort. He is concerned about current medication regimen making him feel sluggish and excessively sedated, although at this time presents fully alert and attentive. States he is very interested in going to an inpatient Rehab after discharge. States " I really want to stay sober". No disruptive behaviors on unit, pleasant and cooperative upon approach. At this time no tremors, no diaphoresis, no restlessness, and vitals stable .    Principal Problem: Schizoaffective disorder, depressive type Diagnosis:   Primary Psychiatric Diagnosis: Schizoaffective disorder, depressive type ,multiple episodes, currently in acute episode    Secondary Psychiatric Diagnosis: Alcohol use disorder, severe Alcohol withdrawal with perceptual disturbances, severe Cocaine use disorder, severe    Non Psychiatric Diagnosis: DM  HTN Hyperlipidemia  Patient Active Problem List   Diagnosis Date Noted  . 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 Hyperlipidemia [E78.5] 04/28/2015  . Schizoaffective disorder, depressive type [F25.1] 04/27/2015  . Alcohol use disorder, severe, dependence [F10.20] 04/27/2015  . Cocaine use disorder, severe, dependence [F14.20] 04/27/2015  . Alcohol withdrawal with perceptual disturbances [F10.232] 04/27/2015  . HTN  (hypertension) [I10] 04/27/2015  . Gout [M10.9] 08/09/2013  . Diabetes mellitus [E11.9] 07/09/2012   Total Time spent with patient: 20 minutes   Past Medical History:  Past Medical History  Diagnosis Date  . Drug abuse   . Alcohol abuse   . Diabetes mellitus   . Uncontrolled hypertension   . Arthritis   . Gout   . DTs (delirium tremens)     history of   . Crack cocaine use   . Noncompliance with medication regimen   . Depression   . Schizophrenia     Past Surgical History  Procedure Laterality Date  . Mandible reconstruction     Family History:  Family History  Problem Relation Age of Onset  . Hypertension     Social History:  History  Alcohol Use  . 7.2 oz/week  . 12 Cans of beer per week    Comment: "10-12 24 oz beers daily"     History  Drug Use  . Yes  . Special: Cocaine, "Crack" cocaine    History   Social History  . Marital Status: Legally Separated    Spouse Name: N/A  . Number of Children: N/A  . Years of Education: N/A   Social History Main Topics  . Smoking status: Current Every Day Smoker -- 0.25 packs/day    Types: Cigarettes  . Smokeless tobacco: Never Used  . Alcohol Use: 7.2 oz/week    12 Cans of beer per week     Comment: "10-12 24 oz beers daily"  . Drug Use: Yes    Special: Cocaine, "Crack" cocaine  . Sexual Activity: Yes    Birth Control/ Protection: None   Other Topics Concern  . None   Social History Narrative   Additional History:    Sleep:  improved   Appetite:  Fair     Musculoskeletal: Strength & Muscle Tone: within normal limits Gait & Station: normal Patient leans: N/A   Psychiatric Specialty Exam: Physical Exam  Review of Systems  Neurological: Positive for dizziness.  Psychiatric/Behavioral: Positive for substance abuse. The patient is nervous/anxious.   All other systems reviewed and are negative.   Blood pressure 138/93, pulse 68, temperature 97.4 F (36.3 C), temperature source Oral, resp. rate  20, height 5\' 10"  (1.778 m), weight 207 lb (93.895 kg).Body mass index is 29.7 kg/(m^2).  General Appearance: Fairly Groomed  Engineer, water::  Good  Speech:  Normal Rate  Volume:  Decreased  Mood:  States his mood is better.   Affect:  slightly anxious  Thought Process:  Goal Directed  Orientation:  Full (Time, Place, and Person)  Thought Content:  Denies hallucinations and no delusions expressed   Suicidal Thoughts:  No  At this time contracts for safety on unit and denies plan or intention of hurting self   Homicidal Thoughts:  No  Memory:  Immediate;   Fair Recent;   Fair Remote;   Fair  Judgement:  Fair  Insight:  Fair  Psychomotor Activity:  Decreased- no agitation or restlessness , no distal tremors noted   Concentration:  Fair  Recall:  [employer] of Knowledge:Fair  Language: Fair  Akathisia:  No  Handed:  Right  AIMS (if indicated):     Assets:  Desire for Improvement Physical Health Social Support  ADL's:  Intact  Cognition: WNL  Sleep:  Number of Hours: 6.5     Current Medications: Current Facility-Administered Medications  Medication Dose Route Frequency Provider Last Rate Last Dose  . alum & mag hydroxide-simeth (MAALOX/MYLANTA) 200-200-20 MG/5ML suspension 30 mL  30 mL Oral Q4H PRN Delfin Gant, NP      . amLODipine (NORVASC) tablet 10 mg  10 mg Oral Daily Delfin Gant, NP   10 mg at 05/01/15 [id number]  . ARIPiprazole (ABILIFY) tablet 10 mg  10 mg Oral QPM Saramma Eappen, MD   10 mg at 05/01/15 1715  . atenolol (TENORMIN) tablet 100 mg  100 mg Oral Daily Delfin Gant, NP   100 mg at 05/01/15 0653  . cloNIDine (CATAPRES) tablet 0.2 mg  0.2 mg Oral BID Delfin Gant, NP   0.2 mg at 05/01/15 0653  . glimepiride (AMARYL) tablet 2 mg  2 mg Oral Q breakfast Delfin Gant, NP   2 mg at 05/01/15 0829  . insulin aspart (novoLOG) injection 0-15 Units  0-15 Units Subcutaneous TID WC Kerrie Buffalo, NP   3 Units at 05/01/15 1300  . insulin aspart  (novoLOG) injection 11 Units  11 Units Subcutaneous Once Kerrie Buffalo, NP      . insulin aspart (novoLOG) injection 4 Units  4 Units Subcutaneous TID WC Kerrie Buffalo, NP   4 Units at 05/01/15 1300  . lisinopril (PRINIVIL,ZESTRIL) tablet 10 mg  10 mg Oral Daily Delfin Gant, NP   10 mg at 05/01/15 0829  . magnesium hydroxide (MILK OF MAGNESIA) suspension 30 mL  30 mL Oral Daily PRN Delfin Gant, NP      . metFORMIN (GLUCOPHAGE) tablet 500 mg  500 mg Oral BID WC Delfin Gant, NP   500 mg at 05/01/15 1716  . multivitamin with minerals tablet 1 tablet  1 tablet Oral Daily Laverle Hobby, PA-C   1 tablet at 05/01/15 [id number]  . paliperidone (INVEGA)  24 hr tablet 3 mg  3 mg Oral QHS Ursula Alert, MD   3 mg at 04/30/15 2133  . simvastatin (ZOCOR) tablet 20 mg  20 mg Oral q1800 Ursula Alert, MD   20 mg at 05/01/15 1716  . thiamine (B-1) injection 100 mg  100 mg Intramuscular Once Laverle Hobby, PA-C   100 mg at 04/26/15 2115  . thiamine (VITAMIN B-1) tablet 100 mg  100 mg Oral Daily Laverle Hobby, PA-C   100 mg at 05/01/15 0830    Lab Results:  Results for orders placed or performed during the hospital encounter of 04/26/15 (from the past 48 hour(s))  Glucose, capillary     Status: Abnormal   Collection Time: 04/29/15  6:31 PM  Result Value Ref Range   Glucose-Capillary 260 (H) 65 - 99 mg/dL  Glucose, capillary     Status: Abnormal   Collection Time: 04/29/15  8:34 PM  Result Value Ref Range   Glucose-Capillary 174 (H) 65 - 99 mg/dL  Glucose, capillary     Status: Abnormal   Collection Time: 04/30/15  6:02 AM  Result Value Ref Range   Glucose-Capillary 185 (H) 65 - 99 mg/dL  Glucose, capillary     Status: Abnormal   Collection Time: 04/30/15 12:11 PM  Result Value Ref Range   Glucose-Capillary 180 (H) 65 - 99 mg/dL  Glucose, capillary     Status: Abnormal   Collection Time: 04/30/15  4:40 PM  Result Value Ref Range   Glucose-Capillary 298 (H) 65 - 99 mg/dL    Comment 1 Notify RN   Glucose, capillary     Status: Abnormal   Collection Time: 04/30/15  8:37 PM  Result Value Ref Range   Glucose-Capillary 244 (H) 65 - 99 mg/dL  Glucose, capillary     Status: Abnormal   Collection Time: 05/01/15  6:23 AM  Result Value Ref Range   Glucose-Capillary 290 (H) 65 - 99 mg/dL  Glucose, capillary     Status: Abnormal   Collection Time: 05/01/15 12:07 PM  Result Value Ref Range   Glucose-Capillary 176 (H) 65 - 99 mg/dL   Comment 1 Notify RN    Comment 2 Document in Chart   Glucose, capillary     Status: Abnormal   Collection Time: 05/01/15  2:22 PM  Result Value Ref Range   Glucose-Capillary 291 (H) 65 - 99 mg/dL  Glucose, capillary     Status: Abnormal   Collection Time: 05/01/15  4:57 PM  Result Value Ref Range   Glucose-Capillary 240 (H) 65 - 99 mg/dL   Comment 1 Notify RN    Comment 2 Document in Chart     Physical Findings: AIMS: Facial and Oral Movements Muscles of Facial Expression: None, normal Lips and Perioral Area: None, normal Jaw: None, normal Tongue: None, normal,Extremity Movements Upper (arms, wrists, hands, fingers): None, normal Lower (legs, knees, ankles, toes): None, normal, Trunk Movements Neck, shoulders, hips: None, normal, Overall Severity Severity of abnormal movements (highest score from questions above): None, normal Incapacitation due to abnormal movements: None, normal Patient's awareness of abnormal movements (rate only patient's report): No Awareness, Dental Status Current problems with teeth and/or dentures?: No Does patient usually wear dentures?: No  CIWA:  CIWA-Ar Total: 2 COWS:     Assessment: Patient not currently presenting with any severe or noticeable alcohol withdrawal symptoms. Regarding Mood Disorder, remains depressed, anxious, but states feeling better than upon admission and no SI or HI. Motivated in sobriety, and  states he feels that if he relapses again he might die . Wants to go to  Rehab. Although currently fully alert, attentive, states current medications making him feel excessively sluggish, tired .     Treatment Plan Summary: Daily contact with patient to assess and evaluate symptoms and progress in treatment and Medication management   For alcohol withdrawal sx: Now off  Detox Protocol, as no current symptoms of WDL, and to minimize symptoms of sedation.  Substance abuse counseling. Referral to residential substance abuse program.  For cocaine use disorder: Will refer to substance abuse program  Will provide counseling/mindfulness.  For schizoaffective disorder: Discontinue Invega, to minimize sedation.  Will continue Invega Sustenna 117 mg  IM -LAST DOSE (04/19/15). Change  Abilify  To 5 mgrs QHS- for hyperprolactinemia (  Dose reduction to decrease sedation)  CSW  working on disposition- patient interested in going to a Rehab after discharge  Patient to participate in therapeutic milieu .    Medical Decision Making:  Review of Psycho-Social Stressors (1), Review or order clinical lab tests (1), Review and summation of old records (2), Review of Last Therapy Session (1), Review of Medication Regimen & Side Effects (2) and Review of New Medication or Change in Dosage (2)     Sakai Heinle MD 05/01/2015, 6:05 PM

## 2015-05-01 NOTE — Progress Notes (Signed)
Patient up at nurses station, reports he does not feel good, vital signs obtained. CBG 291 mg/dl, BP 138/93, pulse 68. Nursing staff will continue to monitor.

## 2015-05-02 DIAGNOSIS — I10 Essential (primary) hypertension: Secondary | ICD-10-CM

## 2015-05-02 LAB — GLUCOSE, CAPILLARY
GLUCOSE-CAPILLARY: 214 mg/dL — AB (ref 65–99)
GLUCOSE-CAPILLARY: 230 mg/dL — AB (ref 65–99)
GLUCOSE-CAPILLARY: 208 mg/dL — AB (ref 65–99)
Glucose-Capillary: 236 mg/dL — ABNORMAL HIGH (ref 65–99)

## 2015-05-02 MED ORDER — ATENOLOL 50 MG PO TABS: 100.0000 mg | ORAL_TABLET | Freq: Every day | ORAL | Status: DC

## 2015-05-02 MED ORDER — GLIMEPIRIDE 4 MG PO TABS: 4.0000 mg | ORAL_TABLET | Freq: Every day | ORAL | Status: DC

## 2015-05-02 MED ORDER — METFORMIN HCL 500 MG PO TABS
1000.0000 mg | ORAL_TABLET | Freq: Two times a day (BID) | ORAL | Status: DC
  Administered 2015-05-02: 1000 mg via ORAL
  Filled 2015-05-02: qty 56
  Filled 2015-05-02 (×2): qty 2
  Filled 2015-05-02: qty 56
  Filled 2015-05-02 (×2): qty 2

## 2015-05-02 MED ORDER — AMLODIPINE BESYLATE 10 MG PO TABS: 10.0000 mg | ORAL_TABLET | Freq: Every day | ORAL | Status: DC

## 2015-05-02 MED ORDER — HYDRALAZINE HCL 25 MG PO TABS: 25.0000 mg | ORAL_TABLET | Freq: Three times a day (TID) | ORAL | Status: DC

## 2015-05-02 MED ORDER — METFORMIN HCL 1000 MG PO TABS: 1000.0000 mg | ORAL_TABLET | Freq: Two times a day (BID) | ORAL | Status: DC

## 2015-05-02 MED ORDER — PALIPERIDONE PALMITATE 117 MG/0.75ML IM SUSP: 117.0000 mg | INTRAMUSCULAR | Status: DC

## 2015-05-02 MED ORDER — NAPROXEN 500 MG PO TABS
500.0000 mg | ORAL_TABLET | Freq: Two times a day (BID) | ORAL | Status: DC | PRN
  Filled 2015-05-02: qty 20

## 2015-05-02 MED ORDER — CLONIDINE HCL 0.3 MG PO TABS: 0.3000 mg | ORAL_TABLET | Freq: Two times a day (BID) | ORAL | Status: DC

## 2015-05-02 MED ORDER — HYDRALAZINE HCL 25 MG PO TABS
25.0000 mg | ORAL_TABLET | Freq: Three times a day (TID) | ORAL | Status: DC
  Administered 2015-05-02 – 2015-05-03 (×3): 25 mg via ORAL
  Filled 2015-05-02 (×4): qty 1
  Filled 2015-05-02 (×3): qty 42

## 2015-05-02 MED ORDER — GLIMEPIRIDE 4 MG PO TABS
4.0000 mg | ORAL_TABLET | Freq: Every day | ORAL | Status: DC
  Filled 2015-05-02: qty 1
  Filled 2015-05-02: qty 14
  Filled 2015-05-02: qty 1

## 2015-05-02 MED ORDER — ARIPIPRAZOLE 5 MG PO TABS
5.0000 mg | ORAL_TABLET | Freq: Every evening | ORAL | Status: DC
  Administered 2015-05-02: 5 mg via ORAL
  Filled 2015-05-02 (×2): qty 1

## 2015-05-02 MED ORDER — LISINOPRIL 20 MG PO TABS: 20.0000 mg | ORAL_TABLET | Freq: Every day | ORAL | Status: DC

## 2015-05-02 MED ORDER — ADULT MULTIVITAMIN W/MINERALS CH: 1.0000 | ORAL_TABLET | Freq: Every day | ORAL | Status: DC

## 2015-05-02 MED ORDER — ARIPIPRAZOLE 5 MG PO TABS: 5.0000 mg | ORAL_TABLET | Freq: Every evening | ORAL | Status: DC

## 2015-05-02 MED ORDER — SIMVASTATIN 20 MG PO TABS: 20.0000 mg | ORAL_TABLET | Freq: Every day | ORAL | Status: DC

## 2015-05-02 MED ORDER — LISINOPRIL 10 MG PO TABS
10.0000 mg | ORAL_TABLET | Freq: Once | ORAL | Status: AC
  Administered 2015-05-02: 10 mg via ORAL
  Filled 2015-05-02: qty 2
  Filled 2015-05-02: qty 1

## 2015-05-02 MED ORDER — LISINOPRIL 20 MG PO TABS
20.0000 mg | ORAL_TABLET | Freq: Every day | ORAL | Status: DC
  Filled 2015-05-02: qty 14
  Filled 2015-05-02: qty 1

## 2015-05-02 MED ORDER — NON FORMULARY: 117.0000 mg | Status: DC

## 2015-05-02 NOTE — Consult Note (Signed)
[facility] Medical Consultation  [name] [id number] DOB: [birth year] DOA: 04/26/2015 PCP: [place]   Requesting physician: Dr Shea Evans Date of consultation: 6/14 Reason for consultation: uncontrolled hypertension.   Impression/Recommendations Principal Problem:   Schizoaffective disorder, depressive type Active Problems:   Diabetes mellitus   Alcohol use disorder, severe, dependence   Cocaine use disorder, severe, dependence   Alcohol withdrawal with perceptual disturbances   HTN (hypertension)   Hyperlipidemia   Hyperprolactinemia    Accelerated hypertension: Patient currently on amlodipine, atelenol, clonidine, and prn hydralazine.  Recommend adding standing hydralazine 25 mg tid AND CHECK orthostatic blood pressures.   Diabetes Mellitus: CBG (last 3)   Recent Labs  05/02/15 0609 05/02/15 1157 05/02/15 1629  GLUCAP 214* 230* 236*    cbg's are running slightly high.  Increased the amaryl and metformin doses today by DM coordinator.    Schizoaffective disorder: Further management as per psychiatry.   Chief Complaint: brought in for depression and detox.   HPI:  54 year old gentleman with h/o cocaine and alcohol abuse admitted for detox and depression. His bp was found to be uncontrolled and medical service was consulted for recommendations.  He currently denies any chest pain, sob, dizziness, palpitations, nausea, vomiting . Abdominal pain or diarrhea. He denies any other complaints.   Review of Systems:  Please see HPI for complete 12 POINT ROS. Pertinent positives in HPI other wise negative.   Past Medical History  Diagnosis Date  . Drug abuse   . Alcohol abuse   . Diabetes mellitus   . Uncontrolled hypertension   . Arthritis   . Gout   . DTs (delirium tremens)     history of   . Crack cocaine use   . Noncompliance with medication regimen   . Depression   . Schizophrenia    Past Surgical History  Procedure  Laterality Date  . Mandible reconstruction     Social History:  reports that he has been smoking Cigarettes.  He has been smoking about 0.25 packs per day. He has never used smokeless tobacco. He reports that he drinks about 7.2 oz of alcohol per week. He reports that he uses illicit drugs (Cocaine and "Crack" cocaine).  No Active Allergies Family History  Problem Relation Age of Onset  . Hypertension      Prior to Admission medications   Medication Sig Start Date End Date Taking? Authorizing Provider  amLODipine (NORVASC) 10 MG tablet Take 10 mg by mouth daily.    Historical Provider, MD  amLODipine (NORVASC) 10 MG tablet Take 1 tablet (10 mg total) by mouth daily. 05/02/15   Niel Hummer, NP  ARIPiprazole (ABILIFY) 5 MG tablet Take 1 tablet (5 mg total) by mouth every evening. 05/02/15   Niel Hummer, NP  atenolol (TENORMIN) 50 MG tablet Take 2 tablets (100 mg total) by mouth daily. 05/02/15   Niel Hummer, NP  benztropine (COGENTIN) 1 MG tablet Take 1 mg by mouth 2 (two) times daily.    Historical Provider, MD  cloNIDine (CATAPRES) 0.2 MG tablet Take 1 tablet (0.2 mg total) by mouth 2 (two) times daily. Patient not taking: Reported on 04/25/2015 11/27/14   [name], FNP  cloNIDine (CATAPRES) 0.3 MG tablet Take 1 tablet (0.3 mg total) by mouth 2 (two) times daily. 05/02/15   Niel Hummer, NP  glimepiride (AMARYL) 2 MG tablet Take 2 mg by mouth daily with breakfast.    Historical Provider, MD  glimepiride (  AMARYL) 4 MG tablet Take 1 tablet (4 mg total) by mouth daily with breakfast. 05/03/15   Niel Hummer, NP  hydrALAZINE (APRESOLINE) 25 MG tablet Take 1 tablet (25 mg total) by mouth every 8 (eight) hours. 05/02/15   Niel Hummer, NP  lisinopril (PRINIVIL,ZESTRIL) 10 MG tablet Take 10 mg by mouth daily.    Historical Provider, MD  lisinopril (PRINIVIL,ZESTRIL) 20 MG tablet Take 1 tablet (20 mg total) by mouth daily. 05/03/15   Niel Hummer, NP  metFORMIN (GLUCOPHAGE) 1000 MG tablet  Take 1 tablet (1,000 mg total) by mouth 2 (two) times daily with a meal. 05/02/15   Niel Hummer, NP  metFORMIN (GLUCOPHAGE) 500 MG tablet Take 500 mg by mouth 2 (two) times daily with a meal.    Historical Provider, MD  metFORMIN (GLUCOPHAGE) 850 MG tablet Take 1 tablet (850 mg total) by mouth 2 (two) times daily with a meal. Patient not taking: Reported on 04/25/2015 11/27/14   [name], FNP  Multiple Vitamin (MULTIVITAMIN WITH MINERALS) TABS tablet Take 1 tablet by mouth daily. 05/02/15   Niel Hummer, NP  naproxen (NAPROSYN) 500 MG tablet Take 500 mg by mouth 2 (two) times daily as needed for mild pain or moderate pain.    Historical Provider, MD  paliperidone (INVEGA) 3 MG 24 hr tablet Take 3 mg by mouth daily.    Historical Provider, MD  Paliperidone Palmitate (INVEGA SUSTENNA) 117 MG/0.75ML SUSP Inject 117 mg into the muscle every 28 (twenty-eight) days. 05/17/15   Niel Hummer, NP  simvastatin (ZOCOR) 20 MG tablet Take 1 tablet (20 mg total) by mouth daily at 6 PM. 05/02/15   Niel Hummer, NP   Physical Exam: Blood pressure 149/100, pulse 74, temperature 97.4 F (36.3 C), temperature source Oral, resp. rate 20, height 5\' 10"  (1.778 m), weight 93.895 kg (207 lb). Filed Vitals:   05/02/15 1721  BP: 149/100  Pulse:   Temp:   Resp:      General:  Drowsy, comfortable  Eyes: PERLA  Neck: no neck masses  Cardiovascular: s1s2, no MRG.  Respiratory: clear to auscultation, no wheezing or rhonchi  Abdomen: soft non tender non distended bowel sounds heard  Skin: no rash seen  Musculoskeletal: no pedal edema.   Psychiatric: flat affect  Neurologic: alert and appears to be oriented, speech normal.   Labs on Admission:  Basic Metabolic Panel: No results for input(s): NA, K, CL, CO2, GLUCOSE, BUN, CREATININE, CALCIUM, MG, PHOS in the last 168 hours. Liver Function Tests: No results for input(s): AST, ALT, ALKPHOS, BILITOT, PROT, ALBUMIN in the last 168 hours. No results for  input(s): LIPASE, AMYLASE in the last 168 hours. No results for input(s): AMMONIA in the last 168 hours. CBC: No results for input(s): WBC, NEUTROABS, HGB, HCT, MCV, PLT in the last 168 hours. Cardiac Enzymes: No results for input(s): CKTOTAL, CKMB, CKMBINDEX, TROPONINI in the last 168 hours. BNP: Invalid input(s): POCBNP CBG:  Recent Labs Lab 05/01/15 1657 05/01/15 2040 05/02/15 0609 05/02/15 1157 05/02/15 1629  GLUCAP 240* 245* 214* 230* 236*    Radiological Exams on Admission: No results found.  EKG: not done.   Time spent: 40 minutes  [facility] Pager [phone]  If 7PM-7AM, please contact night-coverage [email or web address] Password [facility] 05/02/2015, 7:23 PM

## 2015-05-02 NOTE — Discharge Summary (Signed)
**Note [name]-Identified via Obfuscation** Physician Discharge Summary Note  Patient:  [name] is an 54 y.o., male MRN:  [id number] DOB:  [birth year] Patient phone:  [phone] (home)  Patient address:   [place] [address],  Total Time spent with patient: 30 minutes  Date of Admission:  04/26/2015 Date of Discharge: 05/03/15  Reason for Admission:  Mood stabilization treatments   Principal Problem: Schizoaffective disorder, depressive type Discharge Diagnoses: Patient Active Problem List   Diagnosis Date Noted  . 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 Hyperlipidemia [E78.5] 04/28/2015  . Schizoaffective disorder, depressive type [F25.1] 04/27/2015  . Alcohol use disorder, severe, dependence [F10.20] 04/27/2015  . Cocaine use disorder, severe, dependence [F14.20] 04/27/2015  . Alcohol withdrawal with perceptual disturbances [F10.232] 04/27/2015  . HTN (hypertension) [I10] 04/27/2015  . Gout [M10.9] 08/09/2013  . Diabetes mellitus [E11.9] 07/09/2012    Musculoskeletal: Strength & Muscle Tone: within normal limits Gait & Station: normal Patient leans: N/A  Psychiatric Specialty Exam: Physical Exam  ROS  Blood pressure 155/105, pulse 75, temperature 97.6 F (36.4 C), temperature source Oral, resp. rate 20, height 5\' 10"  (1.778 m), weight 93.895 kg (207 lb).Body mass index is 29.7 kg/(m^2).  See Physician SRA     Have you used any form of tobacco in the last 30 days? (Cigarettes, Smokeless Tobacco, Cigars, and/or Pipes): Yes  Has this patient used any form of tobacco in the last 30 days? (Cigarettes, Smokeless Tobacco, Cigars, and/or Pipes) Yes, A prescription for an FDA-approved tobacco cessation medication was offered at discharge and the patient refused  Past Medical History:  Past Medical History  Diagnosis Date  . Drug abuse   . Alcohol abuse   . Diabetes mellitus   . Uncontrolled hypertension   . Arthritis   . Gout   . DTs (delirium tremens)     history of   . Crack  cocaine use   . Noncompliance with medication regimen   . Depression   . Schizophrenia     Past Surgical History  Procedure Laterality Date  . Mandible reconstruction     Family History:  Family History  Problem Relation Age of Onset  . Hypertension     Social History:  History  Alcohol Use  . 7.2 oz/week  . 12 Cans of beer per week    Comment: "10-12 24 oz beers daily"     History  Drug Use  . Yes  . Special: Cocaine, "Crack" cocaine    History   Social History  . Marital Status: Legally Separated    Spouse Name: N/A  . Number of Children: N/A  . Years of Education: N/A   Social History Main Topics  . Smoking status: Current Every Day Smoker -- 0.25 packs/day    Types: Cigarettes  . Smokeless tobacco: Never Used  . Alcohol Use: 7.2 oz/week    12 Cans of beer per week     Comment: "10-12 24 oz beers daily"  . Drug Use: Yes    Special: Cocaine, "Crack" cocaine  . Sexual Activity: Yes    Birth Control/ Protection: None   Other Topics Concern  . None   Social History Narrative   Risk to Self: Is patient at risk for suicide?: Yes What has been your use of drugs/alcohol within the last 12 months?: 5 40's per day,  I pan handle for money to get cocaine Risk to Others:   Prior Inpatient Therapy:   Prior Outpatient Therapy:    Level of Care:  RTC  Hospital Course:    Patient is a 54 year old African American male that was IVC'd from [facility]. Per IVC paperwork patient reporting having auditory hallucinations commanding him to kill others. Per documentation in epic patient reported that he is unable to contract safety and is a danger to others.During the initial assessment the patient reported that he also sees people when he is using. Patient reportedthat he did not remember stating that the voices are telling him to kill others. Patient reported that he has not taken his medication in a couple of days. Per IVC paperwork the patient has a diagnosis of Bipolar  and Schizophrenia. Patient was also intoxicated and has addiction to cocaine and alcohol. Patient reports a prior hospitalization for substance abuse but not mental health. Patient denies prior psychiatric hospitalization. Patient denied physical, sexual or emotional abuse. Patient reported drinking 5-6 quarts of alcohol daily. Patient reported using various amounts of cocaine every three days. Patient reports his last drink was earlier on day of admission when he drank 2 quarts of liquor. Patient reports that his last use of cocaine was on day of admission when he used $300.00 worth of cocaine. Patient reports that his first use of alcohol was at the age of 8 and his first use of cocaine was at the age of 45.          [name] was admitted to the adult 500 unit. He was evaluated and his symptoms were identified. Medication management was discussed and initiated. The patient was started on [place] for treatment of psychotic symptoms and to improve compliance with medications after discharge. Of note his Prolactin level was found to be 47.8 on 04/28/15. To address this issue the patient was started on Abilify to decrease prolactin levels. His dosage of Abilify was decreased to address complaints of sedation. Patient completed the librium protocol for the purposes of alcohol detox.  He was oriented to the unit and encouraged to participate in unit programming. Medical problems were identified and treated appropriately. A medical consult was obtained for severely elevated blood pressure. The patient was on several antihypertensive medications prior to admission and several of his medication dosages were increased to better treat his Hypertension. A diabetic consult was ordered due to Hemoglobin A1c at 6.5. The patient was managed on insulin but reported he would not be able to continue this treatment at home. Due to this concern of patient his oral diabetes medication were increased to  Metformin 1,000 mg bid and Amaryl 4 mg daily prior to discharge by Diabetes Coordinator. Home medication was restarted as needed.        The patient was evaluated each day by a clinical provider to ascertain the patient's response to treatment.  Improvement was noted by the patient's report of decreasing symptoms, improved sleep and appetite, affect, medication tolerance, behavior, and participation in unit programming.  He was asked each day to complete a self inventory noting mood, mental status, pain, new symptoms, anxiety and concerns.         He responded well to medication and being in a therapeutic and supportive environment. Positive and appropriate behavior was noted and the patient was motivated for recovery.  The patient worked closely with the treatment team and case manager to develop a discharge plan with appropriate goals. Coping skills, problem solving as well as relaxation therapies were also part of the unit programming.         By the day of discharge he was in  much improved condition than upon admission.  Symptoms were reported as significantly decreased or resolved completely. The patient denied SI/HI and voiced no AVH. He was motivated to continue taking medication with a goal of continued improvement in mental health. [name] was discharged home with a plan to follow up as noted below. The patient was provided with sample medications and prescriptions at time of discharge. He left [facility] in stable condition with all belongings returned to him.    Consults:  psychiatry  Significant Diagnostic Studies:  Chemistry panel, Lipid profile, CBC, Elevated prolactin level, TSH  Discharge Vitals:   Blood pressure 155/105, pulse 75, temperature 97.6 F (36.4 C), temperature source Oral, resp. rate 20, height 5\' 10"  (1.778 m), weight 93.895 kg (207 lb). Body mass index is 29.7 kg/(m^2). Lab Results:   Results for orders placed or performed during the hospital encounter of 04/26/15  (from the past 72 hour(s))  Glucose, capillary     Status: Abnormal   Collection Time: 05/02/15 11:57 AM  Result Value Ref Range   Glucose-Capillary 230 (H) 65 - 99 mg/dL   Comment 1 Notify RN    Comment 2 Document in Chart   Glucose, capillary     Status: Abnormal   Collection Time: 05/02/15  4:29 PM  Result Value Ref Range   Glucose-Capillary 236 (H) 65 - 99 mg/dL   Comment 1 Notify RN    Comment 2 Document in Chart   Glucose, capillary     Status: Abnormal   Collection Time: 05/02/15  9:49 PM  Result Value Ref Range   Glucose-Capillary 208 (H) 65 - 99 mg/dL  Glucose, capillary     Status: Abnormal   Collection Time: 05/03/15  6:18 AM  Result Value Ref Range   Glucose-Capillary 197 (H) 65 - 99 mg/dL    Physical Findings: AIMS: Facial and Oral Movements Muscles of Facial Expression: None, normal Lips and Perioral Area: None, normal Jaw: None, normal Tongue: None, normal,Extremity Movements Upper (arms, wrists, hands, fingers): None, normal Lower (legs, knees, ankles, toes): None, normal, Trunk Movements Neck, shoulders, hips: None, normal, Overall Severity Severity of abnormal movements (highest score from questions above): None, normal Incapacitation due to abnormal movements: None, normal Patient's awareness of abnormal movements (rate only patient's report): No Awareness, Dental Status Current problems with teeth and/or dentures?: No Does patient usually wear dentures?: No  CIWA:  CIWA-Ar Total: 1 COWS:      See Psychiatric Specialty Exam and Suicide Risk Assessment completed by Attending Physician prior to discharge.  Discharge destination:  [facility]  Is patient on multiple antipsychotic therapies at discharge:  Yes,   Do you recommend tapering to monotherapy for antipsychotics?  No   Has Patient had three or more failed trials of antipsychotic monotherapy by history:  No  Recommended Plan for Multiple Antipsychotic Therapies: Additional reason(s) for  multiple antispychotic treatment: patient started on abilify due to hyperprolactinemia - pt currently stable on Invega sustenna IM.       Discharge Instructions    Discharge instructions    Complete by:  As directed   Please follow up with your Primary Care Provider after discharge for follow up of chronic medical problems such as Diabetes and Hypertension.            Medication List    STOP taking these medications        benztropine 1 MG tablet  Commonly known as:  COGENTIN     paliperidone 3 MG 24 hr  tablet  Commonly known as:  INVEGA      TAKE these medications      Indication   amLODipine 10 MG tablet  Commonly known as:  NORVASC  Take 1 tablet (10 mg total) by mouth daily.   Indication:  High Blood Pressure     ARIPiprazole 5 MG tablet  Commonly known as:  ABILIFY  Take 1 tablet (5 mg total) by mouth every evening.   Indication:  Mood control, Elevated prolactin level     atenolol 50 MG tablet  Commonly known as:  TENORMIN  Take 2 tablets (100 mg total) by mouth daily.   Indication:  High Blood Pressure     cloNIDine 0.3 MG tablet  Commonly known as:  CATAPRES  Take 1 tablet (0.3 mg total) by mouth 2 (two) times daily.   Indication:  High Blood Pressure     glimepiride 4 MG tablet  Commonly known as:  AMARYL  Take 1 tablet (4 mg total) by mouth daily with breakfast.   Indication:  Type 2 Diabetes     hydrALAZINE 25 MG tablet  Commonly known as:  APRESOLINE  Take 1 tablet (25 mg total) by mouth every 8 (eight) hours.   Indication:  High Blood Pressure     lisinopril 20 MG tablet  Commonly known as:  PRINIVIL,ZESTRIL  Take 1 tablet (20 mg total) by mouth daily.   Indication:  High Blood Pressure     metFORMIN 1000 MG tablet  Commonly known as:  GLUCOPHAGE  Take 1 tablet (1,000 mg total) by mouth 2 (two) times daily with a meal.   Indication:  Antipsychotic Therapy-Induced Weight Gain, Type 2 Diabetes     multivitamin with minerals Tabs tablet   Take 1 tablet by mouth daily.   Indication:  Vitamin Supplementation     naproxen 500 MG tablet  Commonly known as:  NAPROSYN  Take 500 mg by mouth 2 (two) times daily as needed for mild pain or moderate pain.      Paliperidone Palmitate 117 MG/0.75ML Susp  Commonly known as:  INVEGA SUSTENNA  Inject 117 mg into the muscle every 28 (twenty-eight) days.  Start taking on:  05/17/2015   Indication:  Schizoaffective Disorder     simvastatin 20 MG tablet  Commonly known as:  ZOCOR  Take 1 tablet (20 mg total) by mouth daily at 6 PM.   Indication:  Inherited Heterozygous Hypercholesterolemia       Follow-up Information    Follow up with [facility] rehab On 05/03/2015.   Why:  [name] from the ACT team will arrive at [place] to deliver you to your screening appointment   Contact information:   [place] [phone]      Follow up with [name].   Why:  [name] will pick you up the day of d/c.   Contact information:   [place] [phone]     Follow-up recommendations:   Activity: No restrictions Diet: regular Tests: as needed, follow up on Prolactin as recommended by out patient provider Other: none  Comments:   Take all your medications as prescribed by your mental healthcare provider.  Report any adverse effects and or reactions from your medicines to your outpatient provider promptly.  Patient is instructed and cautioned to not engage in alcohol and or illegal drug use while on prescription medicines.  In the event of worsening symptoms, patient is instructed to  call the crisis hotline, 911 and or go to the nearest ED for appropriate evaluation and treatment of symptoms.  Follow-up with your primary care provider for your other medical issues, concerns and or health care needs.   Total Discharge Time: Greater than 30 minutes  Signed: Pinkey Mcjunkin NP-C 05/05/2015, 11:44 AM

## 2015-05-02 NOTE — Tx Team (Signed)
Interdisciplinary Treatment Plan Update (Adult)  Date:  05/02/2015   Time Reviewed:  3:41 PM   Progress in Treatment: Attending groups: Yes. Participating in groups:  Yes. Taking medication as prescribed:  Yes. Tolerating medication:  Yes. Family/Significant othe contact made:  No Patient understands diagnosis:  Yes   Discussing patient identified problems/goals with staff:  Yes, see initial care plan. Medical problems stabilized or resolved:  Yes. Denies suicidal/homicidal ideation: Yes. Issues/concerns per patient self-inventory:  No. Other:  New problem(s) identified:  Discharge Plan or Barriers:  ACT team worker will pick him up to transport to [facility] for a screening at [place] tomorrow  Reason for Continuation of Hospitalization:   Comments:  Estimated length of stay:  D/C tomorrow  New goal(s):  Review of initial/current patient goals per problem list:     Attendees: Patient:  05/02/2015 3:41 PM   Family:   05/02/2015 3:41 PM   Physician:  Ursula Alert, MD 05/02/2015 3:41 PM   Nursing:   Janann August, RN 05/02/2015 3:41 PM   CSW:    Roque Lias, [place]   05/02/2015 3:41 PM   Other:  05/02/2015 3:41 PM   Other:   05/02/2015 3:41 PM   Other:  Lars Pinks, Nurse CM 05/02/2015 3:41 PM   Other:  Lucinda Dell, [place] Sessions TCT 05/02/2015 3:41 PM   Other:  Norberto Sorenson, [place]  05/02/2015 3:41 PM   Other:  05/02/2015 3:41 PM   Other:  05/02/2015 3:41 PM   Other:  05/02/2015 3:41 PM   Other:  05/02/2015 3:41 PM   Other:  05/02/2015 3:41 PM   Other:   05/02/2015 3:41 PM    Scribe for Treatment Team:   [name], 05/02/2015 3:41 PM

## 2015-05-02 NOTE — Progress Notes (Signed)
Patient ID: [name], male   DOB: 1960-12-18, 54 y.o.   MRN: [id number] [facility] MD Progress Note  05/02/2015 1:56 PM [name]  MRN:  [id number] Subjective- Patient reports " I am fine , but my BP has been a bit high."   Objective- Patient seen and chart reviewed.Discussed patient with treatment team.  Patient is a 54 year old male , who has history of alcohol and cocaine dependencies, as well as history of schizoaffective disorder.  Patient appears less depressed, less anxious , seen more on the unit , attending group activities. Pt this AM denies any psychosis /denies any withdrawal sx. Pt speech has been 'slurred" since admission, initially thought to be due to being intoxicated on alcohol as well as being on Libirum. 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 Pt continues to be motivated to get help with his substance abuse . No disruptive behaviors on unit, pleasant and cooperative upon approach.  Per nursing pts BP has been elevated- will consult hospitalist.    Principal Problem: Schizoaffective disorder, depressive type Diagnosis:   Primary Psychiatric Diagnosis: Schizoaffective disorder, depressive type ,multiple episodes, currently in acute episode    Secondary Psychiatric Diagnosis: Alcohol use disorder, severe Alcohol withdrawal with perceptual disturbances, severe Cocaine use disorder, severe    Non Psychiatric Diagnosis: DM  HTN Hyperlipidemia  Patient Active Problem List   Diagnosis Date Noted  . 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 Hyperlipidemia [E78.5] 04/28/2015  . Schizoaffective disorder, depressive type [F25.1] 04/27/2015  . Alcohol use disorder, severe, dependence [F10.20] 04/27/2015  . Cocaine use disorder, severe, dependence [F14.20] 04/27/2015  . Alcohol withdrawal with perceptual disturbances [F10.232] 04/27/2015  . HTN  (hypertension) [I10] 04/27/2015  . Gout [M10.9] 08/09/2013  . Diabetes mellitus [E11.9] 07/09/2012   Total Time spent with patient: 30 minutes   Past Medical History:  Past Medical History  Diagnosis Date  . Drug abuse   . Alcohol abuse   . Diabetes mellitus   . Uncontrolled hypertension   . Arthritis   . Gout   . DTs (delirium tremens)     history of   . Crack cocaine use   . Noncompliance with medication regimen   . Depression   . Schizophrenia     Past Surgical History  Procedure Laterality Date  . Mandible reconstruction     Family History:  Family History  Problem Relation Age of Onset  . Hypertension     Social History:  History  Alcohol Use  . 7.2 oz/week  . 12 Cans of beer per week    Comment: "10-12 24 oz beers daily"     History  Drug Use  . Yes  . Special: Cocaine, "Crack" cocaine    History   Social History  . Marital Status: Legally Separated    Spouse Name: N/A  . Number of Children: N/A  . Years of Education: N/A   Social History Main Topics  . Smoking status: Current Every Day Smoker -- 0.25 packs/day    Types: Cigarettes  . Smokeless tobacco: Never Used  . Alcohol Use: 7.2 oz/week    12 Cans of beer per week     Comment: "10-12 24 oz beers daily"  . Drug Use: Yes    Special: Cocaine, "Crack" cocaine  . Sexual Activity: Yes    Birth Control/ Protection: None   Other Topics Concern  .  None   Social History Narrative   Additional History:    Sleep: improved   Appetite:  Fair     Musculoskeletal: Strength & Muscle Tone: within normal limits Gait & Station: normal Patient leans: N/A   Psychiatric Specialty Exam: Physical Exam  Review of Systems  Psychiatric/Behavioral: Positive for substance abuse.  All other systems reviewed and are negative.   Blood pressure 156/100, pulse 68, temperature 97.4 F (36.3 C), temperature source Oral, resp. rate 20, height 5\' 10"  (1.778 m), weight 93.895 kg (207 lb).Body mass index  is 29.7 kg/(m^2).  General Appearance: Fairly Groomed  Engineer, water::  Good  Speech:  Normal Rate  Volume:  Decreased  Mood:  States his mood is better.   Affect:  Congruent  Thought Process:  Goal Directed  Orientation:  Full (Time, Place, and Person)  Thought Content:  Denies hallucinations and no delusions expressed   Suicidal Thoughts:  No  At this time contracts for safety on unit and denies plan or intention of hurting self   Homicidal Thoughts:  No  Memory:  Immediate;   Fair Recent;   Fair Remote;   Fair  Judgement:  Fair  Insight:  Fair  Psychomotor Activity:  Decreased- no agitation or restlessness , no distal tremors noted   Concentration:  Fair  Recall:  [employer] of Knowledge:Fair  Language: Fair  Akathisia:  No  Handed:  Right  AIMS (if indicated):     Assets:  Desire for Improvement Physical Health Social Support  ADL's:  Intact  Cognition: WNL  Sleep:  Number of Hours: 6.5     Current Medications: Current Facility-Administered Medications  Medication Dose Route Frequency Provider Last Rate Last Dose  . alum & mag hydroxide-simeth (MAALOX/MYLANTA) 200-200-20 MG/5ML suspension 30 mL  30 mL Oral Q4H PRN Delfin Gant, NP      . amLODipine (NORVASC) tablet 10 mg  10 mg Oral Daily Delfin Gant, NP   10 mg at 05/02/15 0810  . ARIPiprazole (ABILIFY) tablet 5 mg  5 mg Oral QPM Fernando A Cobos, MD      . atenolol (TENORMIN) tablet 100 mg  100 mg Oral Daily Delfin Gant, NP   100 mg at 05/02/15 0810  . cloNIDine (CATAPRES) tablet 0.3 mg  0.3 mg Oral BID Laverle Hobby, PA-C   0.3 mg at 05/02/15 0810  . [START ON 05/03/2015] glimepiride (AMARYL) tablet 4 mg  4 mg Oral Q breakfast Freddi Forster, MD      . hydrALAZINE (APRESOLINE) tablet 10 mg  10 mg Oral Q6H PRN Laverle Hobby, PA-C   10 mg at 05/02/15 1007  . insulin aspart (novoLOG) injection 0-20 Units  0-20 Units Subcutaneous TID WC Laverle Hobby, PA-C   7 Units at 05/02/15 1209  . insulin  aspart (novoLOG) injection 0-5 Units  0-5 Units Subcutaneous QHS Laverle Hobby, PA-C   2 Units at 05/01/15 2301  . insulin aspart (novoLOG) injection 11 Units  11 Units Subcutaneous Once Kerrie Buffalo, NP      . insulin aspart (novoLOG) injection 4 Units  4 Units Subcutaneous TID WC Kerrie Buffalo, NP   4 Units at 05/02/15 1210  . lisinopril (PRINIVIL,ZESTRIL) tablet 10 mg  10 mg Oral Daily Delfin Gant, NP   10 mg at 05/02/15 0810  . magnesium hydroxide (MILK OF MAGNESIA) suspension 30 mL  30 mL Oral Daily PRN Delfin Gant, NP      . metFORMIN (  GLUCOPHAGE) tablet 1,000 mg  1,000 mg Oral BID WC Ursula Alert, MD      . multivitamin with minerals tablet 1 tablet  1 tablet Oral Daily Laverle Hobby, PA-C   1 tablet at 05/02/15 0810  . simvastatin (ZOCOR) tablet 20 mg  20 mg Oral q1800 Ursula Alert, MD   20 mg at 05/01/15 1716  . thiamine (B-1) injection 100 mg  100 mg Intramuscular Once Laverle Hobby, PA-C   100 mg at 04/26/15 2115  . thiamine (VITAMIN B-1) tablet 100 mg  100 mg Oral Daily Laverle Hobby, PA-C   100 mg at 05/02/15 [id number]    Lab Results:  Results for orders placed or performed during the hospital encounter of 04/26/15 (from the past 48 hour(s))  Glucose, capillary     Status: Abnormal   Collection Time: 04/30/15  4:40 PM  Result Value Ref Range   Glucose-Capillary 298 (H) 65 - 99 mg/dL   Comment 1 Notify RN   Glucose, capillary     Status: Abnormal   Collection Time: 04/30/15  8:37 PM  Result Value Ref Range   Glucose-Capillary 244 (H) 65 - 99 mg/dL  Glucose, capillary     Status: Abnormal   Collection Time: 05/01/15  6:23 AM  Result Value Ref Range   Glucose-Capillary 290 (H) 65 - 99 mg/dL  Glucose, capillary     Status: Abnormal   Collection Time: 05/01/15 12:07 PM  Result Value Ref Range   Glucose-Capillary 176 (H) 65 - 99 mg/dL   Comment 1 Notify RN    Comment 2 Document in Chart   Glucose, capillary     Status: Abnormal   Collection Time:  05/01/15  2:22 PM  Result Value Ref Range   Glucose-Capillary 291 (H) 65 - 99 mg/dL  Glucose, capillary     Status: Abnormal   Collection Time: 05/01/15  4:57 PM  Result Value Ref Range   Glucose-Capillary 240 (H) 65 - 99 mg/dL   Comment 1 Notify RN    Comment 2 Document in Chart   Glucose, capillary     Status: Abnormal   Collection Time: 05/01/15  8:40 PM  Result Value Ref Range   Glucose-Capillary 245 (H) 65 - 99 mg/dL  Glucose, capillary     Status: Abnormal   Collection Time: 05/02/15  6:09 AM  Result Value Ref Range   Glucose-Capillary 214 (H) 65 - 99 mg/dL   Comment 1 Notify RN   Glucose, capillary     Status: Abnormal   Collection Time: 05/02/15 11:57 AM  Result Value Ref Range   Glucose-Capillary 230 (H) 65 - 99 mg/dL   Comment 1 Notify RN    Comment 2 Document in Chart     Physical Findings: AIMS: Facial and Oral Movements Muscles of Facial Expression: None, normal Lips and Perioral Area: None, normal Jaw: None, normal Tongue: None, normal,Extremity Movements Upper (arms, wrists, hands, fingers): None, normal Lower (legs, knees, ankles, toes): None, normal, Trunk Movements Neck, shoulders, hips: None, normal, Overall Severity Severity of abnormal movements (highest score from questions above): None, normal Incapacitation due to abnormal movements: None, normal Patient's awareness of abnormal movements (rate only patient's report): No Awareness, Dental Status Current problems with teeth and/or dentures?: No Does patient usually wear dentures?: No  CIWA:  CIWA-Ar Total: 0 COWS:     Assessment: Patient not currently presenting with any severe or noticeable alcohol withdrawal symptoms. Pt has been sluggish , slow , since admission, but  denies depression or anxiety today , affect is more reactive , remains motivated to get help with his substance abuse.     Treatment Plan Summary: Daily contact with patient to assess and evaluate symptoms and progress in  treatment and Medication management   For alcohol withdrawal sx: Now off  Detox Protocol, as no current symptoms of WDL, and to minimize symptoms of sedation.  Substance abuse counseling. Referral to residential substance abuse program.  For cocaine use disorder: Will refer to substance abuse program  Will provide counseling/mindfulness.  For schizoaffective disorder: Invega PO discontinued inorder to reduce sedation. Will continue Invega Sustenna 117 mg  IM -LAST DOSE (04/19/15). Continue Abilify 5 mgrs QHS- for hyperprolactinemia .   For DM - reviewed recent notes per DM coordinator. Pt to be discharged on oral hypoglycemic medications. Will DC Insulin on discharge.  For HTN - Hospitalist consult today. Continue current medication regimen.   CSW  working on disposition- patient interested in going to a Rehab after discharge  Patient to participate in therapeutic milieu .    Medical Decision Making:  Review of Psycho-Social Stressors (1), Review or order clinical lab tests (1), Review of Last Therapy Session (1), Review of Medication Regimen & Side Effects (2) and Review of New Medication or Change in Dosage (2)     Daryel Kenneth MD 05/02/2015, 1:56 PM

## 2015-05-02 NOTE — Progress Notes (Signed)
  [name] Adult Case Management Discharge Plan :  Will you be returning to the same living situation after discharge:  No. [facility] rehab for screening for admission appointment At discharge, do you have transportation home?: Yes,  ACT team Do you have the ability to pay for your medications: Yes,  MCD  Release of information consent forms completed and in the chart;  Patient's signature needed at discharge.  Patient to Follow up at: Follow-up Information    Follow up with [facility] rehab On 05/03/2015.   Why:  [name] from the ACT team will arrive at [name] to deliver you to your screening appointment   Contact information:   [place] [phone]      Follow up with [name].   Why:  [name] will pick you up the day of d/c.   Contact information:   [place] [phone]      Patient denies SI/HI: Yes,  yes    Safety Planning and Suicide Prevention discussed: Yes,  yes  Have you used any form of tobacco in the last 30 days? (Cigarettes, Smokeless Tobacco, Cigars, and/or Pipes): Yes  Has patient been referred to the Quitline?: Patient refused referral  "I only smoke when I am drinking"  [name] 05/02/2015, 4:15 PM

## 2015-05-02 NOTE — Progress Notes (Signed)
Adult Psychoeducational Group Note  Date:  05/02/2015 Time:  0900  Group Topic/Focus:  Recovery Goals:   The focus of this group is to identify appropriate goals for recovery and establish a plan to achieve them.  Participation Level:  Active  Participation Quality:  Appropriate  Affect:  Appropriate  Cognitive:  Appropriate  Insight: Appropriate  Engagement in Group:  Engaged  Modes of Intervention:  Education  Additional Comments:    Aliha Diedrich L 05/02/2015, 10:56 AM

## 2015-05-02 NOTE — BHH Suicide Risk Assessment (Signed)
[facility] INPATIENT:  Family/Significant Other Suicide Prevention Education  Suicide Prevention Education:  Patient Discharged to [name]:  Suicide Prevention Education Not Provided:[name] Eulas Post, ACT team worker, will pick up Gerald Stabs in AM and deliver him to [facility] rehab for screening for admission appointment.  The patient is discharging to another healthcare facility for continuation of treatment.  The patient's medical information, including suicide ideations and risk factors, are a part of the medical information shared with the receiving healthcare facility.  [name] 05/02/2015, 3:45 PM

## 2015-05-02 NOTE — Progress Notes (Signed)
Inpatient Diabetes Program Recommendations  AACE/ADA: New Consensus Statement on Inpatient Glycemic Control (2013)  Target Ranges:  Prepandial:   less than 140 mg/dL      Peak postprandial:   less than 180 mg/dL (1-2 hours)      Critically ill patients:  140 - 180 mg/dL     Results for [name], [name] (MRN [id number]) as of 05/02/2015 09:48  Ref. Range 05/01/2015 06:23 05/01/2015 12:07 05/01/2015 14:22 05/01/2015 16:57 05/01/2015 20:40  Glucose-Capillary Latest Ref Range: 65-99 mg/dL 290 (H) 176 (H) 291 (H) 240 (H) 245 (H)    Results for [name], [name] (MRN [id number]) as of 05/02/2015 09:48  Ref. Range 04/28/2015 06:49  Hemoglobin A1C Latest Ref Range: 4.8-5.6 % 6.5 (H)     Admit with: Schizoaffective disorder/ ETOH abuse/ Cocaine Abuse   History: DM, HTN  Home DM Meds: Metformin 500 mg bid       Amaryl 2 mg daily  Current DM Orders: Metformin 500 mg bid            Amaryl 2 mg daily            Novolog Resistant SSI (0-20 units) tid ac + HS            Novolog 4 units tidwc    **Note patient's A1c shows decent control prior to hospitalization.  Question if patient was not eating much prior to admission and that perhaps is why his A1c looks so good?  **Patient currently requiring Novolog SSI and Novolog Meal Coverage in addition to his home oral DM medications here in hospital.  Patient is likely eating a lot more food here in the hospital setting than he has been at home.    MD- Recommend the following oral diabetes medication adjustments for patient upon discharge:  1. Increase patient's home dose of Metformin to 1000 mg bid  2. Increase patient's home dose of Amaryl to 4 mg daily   Hopefully these adjustments will help better manage patient's blood glucose levels at the [facility].    Will follow Wyn Quaker RN, MSN, CDE Diabetes Coordinator Inpatient Glycemic Control Team Team Pager: [phone] (8a-5p)

## 2015-05-02 NOTE — BHH Suicide Risk Assessment (Addendum)
[facility] Discharge Suicide Risk Assessment   Demographic Factors:  Male  Total Time spent with patient: 30 minutes  Musculoskeletal: Strength & Muscle Tone: within normal limits Gait & Station: normal Patient leans: N/A  Psychiatric Specialty Exam: Physical Exam  Review of Systems  Psychiatric/Behavioral: Positive for substance abuse.  All other systems reviewed and are negative.   Blood pressure 156/100, pulse 68, temperature 97.4 F (36.3 C), temperature source Oral, resp. rate 20, height 5\' 10"  (1.778 m), weight 93.895 kg (207 lb).Body mass index is 29.7 kg/(m^2).  General Appearance: Casual  Eye Contact::  Fair  Speech:  Pt with slurred speech at baselin- reports he had several surgeries to his jaw, this has affected his speech409  Volume:  Normal  Mood:  Anxious  Affect:  Appropriate  Thought Process:  Coherent  Orientation:  Full (Time, Place, and Person)  Thought Content:  WDL  Suicidal Thoughts:  No  Homicidal Thoughts:  No  Memory:  Immediate;   Fair Recent;   Fair Remote;   Fair  Judgement:  Fair  Insight:  Fair  Psychomotor Activity:  Decreased  Concentration:  Fair  Recall:  [employer] of Knowledge:Fair  Language: Fair  Akathisia:  No  Handed:  Right  AIMS (if indicated):     Assets:  Communication Skills Desire for Improvement  Sleep:  Number of Hours: 6.5  Cognition: WNL  ADL's:  Intact   Have you used any form of tobacco in the last 30 days? (Cigarettes, Smokeless Tobacco, Cigars, and/or Pipes): Yes  Has this patient used any form of tobacco in the last 30 days? (Cigarettes, Smokeless Tobacco, Cigars, and/or Pipes) Yes, A prescription for an FDA-approved tobacco cessation medication was offered at discharge and the patient refused  Mental Status Per Nursing Assessment::   On Admission:  NA  Current Mental Status by Physician: pt denies SI/HI/AH/VH  Loss Factors: NA  Historical Factors: Impulsivity  Risk Reduction Factors:   Positive social  support  Continued Clinical Symptoms:  Alcohol/Substance Abuse/Dependencies Previous Psychiatric Diagnoses and Treatments  Cognitive Features That Contribute To Risk:  Polarized thinking    Suicide Risk:  Minimal: No identifiable suicidal ideation.  Patients presenting with no risk factors but with morbid ruminations; may be classified as minimal risk based on the severity of the depressive symptoms  Principal Problem: Schizoaffective disorder, depressive type Discharge Diagnoses:  Patient Active Problem List   Diagnosis Date Noted  . 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 Hyperlipidemia [E78.5] 04/28/2015  . Schizoaffective disorder, depressive type [F25.1] 04/27/2015  . Alcohol use disorder, severe, dependence [F10.20] 04/27/2015  . Cocaine use disorder, severe, dependence [F14.20] 04/27/2015  . Alcohol withdrawal with perceptual disturbances [F10.232] 04/27/2015  . HTN (hypertension) [I10] 04/27/2015  . Gout [M10.9] 08/09/2013  . Diabetes mellitus [E11.9] 07/09/2012      Plan Of Care/Follow-up recommendations:  Activity:  No restrictions Diet:  regular Tests:  as needed, follow up on Prolactin as recommended by out patient provider Other:  none  Is patient on multiple antipsychotic therapies at discharge:  Yes,   Do you recommend tapering to monotherapy for antipsychotics?  No   Has Patient had three or more failed trials of antipsychotic monotherapy by history:  No  Recommended Plan for Multiple Antipsychotic Therapies: Additional reason(s) for multiple antispychotic treatment:  patient started on abilify due to hyperprolactinemia - pt currently stable on Invega sustenna IM.    Bernardina Cacho md 05/02/2015, 3:03 PM

## 2015-05-02 NOTE — BHH Group Notes (Signed)
[place] LCSW Group Therapy  05/02/2015 1:15 pm  Type of Therapy: Process Group Therapy  Participation Level:  Active  Participation Quality:  Appropriate  Affect:  Flat  Cognitive:  Oriented  Insight:  Improving  Engagement in Group:  Limited  Engagement in Therapy:  Limited  Modes of Intervention:  Activity, Clarification, Education, Problem-solving and Support  Summary of Progress/Problems: Today's group addressed the issue of overcoming obstacles.  Patients were asked to identify their biggest obstacle post d/c that stands in the way of their on-going success, and then problem solve as to how to manage this.  [name] states he knows he misinterprets events and statements sometimes that cause him to make a bigger deal out of them than they really are, which cause stress and anxiety for him.  He was open to looking at the factors involved in this and how he could try to become more neutral and objective.    [name] 05/02/2015   10:45 AM

## 2015-05-02 NOTE — Progress Notes (Signed)
Patient ID: [name], male   DOB: 20-Mar-1961, 54 y.o.   MRN: [id number]  Pt currently presents with a blunted affect and pleasant behavior. Per self inventory, pt rates depression at a 5, hopelessness at a 5 and anxiety at a 3. [name] daily goal is to "go home." Pt reports fair sleep, good concentration, low energy and a good appetite.   Pt provided with medications per providers orders. [name] labs and vitals were monitored throughout the day. Pt supported emotionally and encouraged to express concerns and questions. Pt educated on medications and diet/nutrition.  [name] safety ensured with 15 minute and environmental checks. Pt currently denies SI/HI and A/V hallucinations. Pt verbally agrees to seek staff if SI/HI or A/VH occurs and to consult with staff before acting on these thoughts. Will continue POC. Pt to be discharged with his ACT team tomorrow at 7am. Sample medications/prescriptions, completed AVS, SRA by Dr. Shea Evans completed, discharge order written.

## 2015-05-02 NOTE — Progress Notes (Signed)
Pt presents with asymptomatic HTN. Hydralazine given. Will report to on-coming for re-check.

## 2015-05-03 LAB — GLUCOSE, CAPILLARY: Glucose-Capillary: 197 mg/dL — ABNORMAL HIGH (ref 65–99)

## 2015-05-03 MED ORDER — HYDROXYZINE HCL 25 MG PO TABS
25.0000 mg | ORAL_TABLET | Freq: Four times a day (QID) | ORAL | Status: DC | PRN
  Administered 2015-05-03: 25 mg via ORAL
  Filled 2015-05-03: qty 1

## 2015-05-03 NOTE — Progress Notes (Signed)
D: Pt reports feeling "out of it" from his medications. Writer reiterated to pt that medications such as his Abilify may take some time to adjust to. Per report, this was previously discussed amongst the patient and the psychiatrist. Pt denied any SI/HI/AVH. Writer verbalized the importance of taking his BP medications and following-up with a provider for his BP. Pt encouraged to make healthier food selections in order to better control his Blood sugars. Writer also verbalized to pt that any continuation of drug abuse may further deteriorate his health such as his BP.  A: Writer administered scheduled and prn medications to pt, per MD orders. Continued support and availability as needed was extended to this pt. Staff continue to monitor pt with q53min checks.  R: No adverse drug reactions noted. Pt receptive to treatment. Pt remains safe at this time.

## 2015-05-03 NOTE — Progress Notes (Signed)
Pt discharged to lobby at 0715 to his ACTT representative. Pt received all belongings,med samples, and AVS. Paperwork was signed and reviewed with previous Therapist, sports. Pt received 0600-0700 medications. Meal was given with insulin administration. Pt informed that appt with [facility] is at 0800. Pt had no questions for Probation officer.

## 2015-05-29 ENCOUNTER — Emergency Department (HOSPITAL_BASED_OUTPATIENT_CLINIC_OR_DEPARTMENT_OTHER)
Admission: EM | Admit: 2015-05-29 | Discharge: 2015-05-29 | Disposition: A | Discharge status: Home / Self Care | Payer: Medicaid Other | Attending: Emergency Medicine | Admitting: Emergency Medicine

## 2015-05-29 ENCOUNTER — Encounter (HOSPITAL_BASED_OUTPATIENT_CLINIC_OR_DEPARTMENT_OTHER): Payer: Self-pay

## 2015-05-29 ENCOUNTER — Other Ambulatory Visit: Payer: Self-pay

## 2015-05-29 ENCOUNTER — Emergency Department (HOSPITAL_BASED_OUTPATIENT_CLINIC_OR_DEPARTMENT_OTHER): Payer: Medicaid Other

## 2015-05-29 DIAGNOSIS — Z720 Tobacco use: Secondary | ICD-10-CM | POA: Diagnosis not present

## 2015-05-29 DIAGNOSIS — Z79899 Other long term (current) drug therapy: Secondary | ICD-10-CM | POA: Diagnosis not present

## 2015-05-29 DIAGNOSIS — E119 Type 2 diabetes mellitus without complications: Secondary | ICD-10-CM | POA: Diagnosis not present

## 2015-05-29 DIAGNOSIS — F329 Major depressive disorder, single episode, unspecified: Secondary | ICD-10-CM | POA: Insufficient documentation

## 2015-05-29 DIAGNOSIS — M1990 Unspecified osteoarthritis, unspecified site: Secondary | ICD-10-CM | POA: Insufficient documentation

## 2015-05-29 DIAGNOSIS — Z8673 Personal history of transient ischemic attack (TIA), and cerebral infarction without residual deficits: Secondary | ICD-10-CM | POA: Diagnosis not present

## 2015-05-29 DIAGNOSIS — M109 Gout, unspecified: Secondary | ICD-10-CM | POA: Diagnosis not present

## 2015-05-29 DIAGNOSIS — R4781 Slurred speech: Secondary | ICD-10-CM

## 2015-05-29 DIAGNOSIS — Z9114 Patient's other noncompliance with medication regimen: Secondary | ICD-10-CM | POA: Diagnosis not present

## 2015-05-29 DIAGNOSIS — R51 Headache: Secondary | ICD-10-CM | POA: Insufficient documentation

## 2015-05-29 DIAGNOSIS — F209 Schizophrenia, unspecified: Secondary | ICD-10-CM | POA: Diagnosis not present

## 2015-05-29 DIAGNOSIS — I10 Essential (primary) hypertension: Secondary | ICD-10-CM | POA: Insufficient documentation

## 2015-05-29 HISTORY — DX: Cerebral infarction, unspecified: I63.9

## 2015-05-29 LAB — BASIC METABOLIC PANEL
Anion gap: 11 (ref 5–15)
BUN: 13 mg/dL (ref 6–20)
CALCIUM: 9.5 mg/dL (ref 8.9–10.3)
CHLORIDE: 104 mmol/L (ref 101–111)
CO2: 25 mmol/L (ref 22–32)
Creatinine, Ser: 0.84 mg/dL (ref 0.61–1.24)
Glucose, Bld: 147 mg/dL — ABNORMAL HIGH (ref 65–99)
Potassium: 3.8 mmol/L (ref 3.5–5.1)
Sodium: 140 mmol/L (ref 135–145)

## 2015-05-29 LAB — CBC WITH DIFFERENTIAL/PLATELET
BASOS PCT: 0 % (ref 0–1)
Basophils Absolute: 0 10*3/uL (ref 0.0–0.1)
EOS ABS: 0.2 10*3/uL (ref 0.0–0.7)
Eosinophils Relative: 3 % (ref 0–5)
HCT: 41.4 % (ref 39.0–52.0)
Hemoglobin: 13.7 g/dL (ref 13.0–17.0)
Lymphocytes Relative: 43 % (ref 12–46)
Lymphs Abs: 2.6 10*3/uL (ref 0.7–4.0)
MCH: 29.7 pg (ref 26.0–34.0)
MCHC: 33.1 g/dL (ref 30.0–36.0)
MCV: 89.8 fL (ref 78.0–100.0)
Monocytes Absolute: 0.7 10*3/uL (ref 0.1–1.0)
Monocytes Relative: 11 % (ref 3–12)
NEUTROS ABS: 2.6 10*3/uL (ref 1.7–7.7)
Neutrophils Relative %: 43 % (ref 43–77)
PLATELETS: 154 10*3/uL (ref 150–400)
RBC: 4.61 MIL/uL (ref 4.22–5.81)
RDW: 11.8 % (ref 11.5–15.5)
WBC: 6 10*3/uL (ref 4.0–10.5)

## 2015-05-29 NOTE — ED Notes (Signed)
Patient transported to CT 

## 2015-05-29 NOTE — ED Notes (Addendum)
Pt brought in from [facility] for elevated BP and slurred speech-pt states he first noticed change in speech and HA 11am-took advil at that time-another dose at 4pm-denies pain at present-slurred speech noted-pt states he has slow speech r/t jaw injury/surgery but states feels different-?slight left side face droop-hand grips equal-steady gait-[facility] staff that brought pt in reports BP 179/120 at 4pm-pt at [facility] since 7/5 for ETOH and crack/cocaine abuse

## 2015-05-29 NOTE — ED Provider Notes (Signed)
CSN: [id number]     Arrival date & time 05/29/15  1627 History  This chart was scribed for Veryl Speak, MD by Helane Gunther, ED Scribe. This patient was seen in room MH02/MH02 and the patient's care was started at 5:32 PM.    Chief Complaint  Patient presents with  . Hypertension  . Slurred speech    Patient is a 54 y.o. male presenting with hypertension. The history is provided by the patient. No language interpreter was used.  Hypertension This is a new problem. The current episode started 6 to 12 hours ago. The problem occurs constantly. The problem has not changed since onset.Associated symptoms include headaches. Pertinent negatives include no chest pain and no abdominal pain. Nothing aggravates the symptoms. Nothing relieves the symptoms. He has tried nothing for the symptoms.   HPI Comments: [name] is a 54 y.o. male with a PMHx of HTN who presents to the Emergency Department complaining of constant, unchanged slurred speech onset 7 hours ago. Pt reports an associated HA. He states his last BP measurement was 170. 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 His last drug use was on Friday. He is on Hydrochlorothiazide for his HTN. He has had a surgery on his jaw about 5.5 years ago. He denies CP, abdominal pain, and leg swelling.  Past Medical History  Diagnosis Date  . Drug abuse   . Alcohol abuse   . Diabetes mellitus   . Uncontrolled hypertension   . Arthritis   . Gout   . DTs (delirium tremens)     history of   . Crack cocaine use   . Noncompliance with medication regimen   . Depression   . Schizophrenia   . Stroke    Past Surgical History  Procedure Laterality Date  . Mandible reconstruction     Family History  Problem Relation Age of Onset  . Hypertension     History  Substance Use Topics  . Smoking status: Current Every Day Smoker -- 0.25 packs/day    Types: Cigarettes  . Smokeless tobacco: Never Used   . Alcohol Use: No     Comment: [facility]    Review of Systems  Cardiovascular: Negative for chest pain and leg swelling.  Gastrointestinal: Negative for abdominal pain.  Neurological: Positive for speech difficulty and headaches.  All other systems reviewed and are negative.     Allergies  Review of patient's allergies indicates no active allergies.  Home Medications   Prior to Admission medications   Medication Sig Start Date End Date Taking? Authorizing Provider  amLODipine (NORVASC) 10 MG tablet Take 1 tablet (10 mg total) by mouth daily. 05/02/15  Yes Niel Hummer, NP  ARIPiprazole (ABILIFY) 5 MG tablet Take 1 tablet (5 mg total) by mouth every evening. 05/02/15  Yes Niel Hummer, NP  cloNIDine (CATAPRES) 0.3 MG tablet Take 1 tablet (0.3 mg total) by mouth 2 (two) times daily. 05/02/15  Yes Niel Hummer, NP  glimepiride (AMARYL) 4 MG tablet Take 1 tablet (4 mg total) by mouth daily with breakfast. 05/03/15  Yes Niel Hummer, NP  hydrALAZINE (APRESOLINE) 25 MG tablet Take 1 tablet (25 mg total) by mouth every 8 (eight) hours. 05/02/15  Yes Niel Hummer, NP  lisinopril (PRINIVIL,ZESTRIL) 20 MG tablet Take 1 tablet (20 mg total) by mouth daily. 05/03/15  Yes Niel Hummer, NP  metFORMIN (GLUCOPHAGE) 1000 MG tablet Take 1 tablet (  1,000 mg total) by mouth 2 (two) times daily with a meal. 05/02/15  Yes Niel Hummer, NP  Multiple Vitamin (MULTIVITAMIN WITH MINERALS) TABS tablet Take 1 tablet by mouth daily. 05/02/15  Yes Niel Hummer, NP  naproxen (NAPROSYN) 500 MG tablet Take 500 mg by mouth 2 (two) times daily as needed for mild pain or moderate pain.   Yes Historical Provider, MD  simvastatin (ZOCOR) 20 MG tablet Take 1 tablet (20 mg total) by mouth daily at 6 PM. 05/02/15  Yes Niel Hummer, NP  atenolol (TENORMIN) 50 MG tablet Take 2 tablets (100 mg total) by mouth daily. 05/02/15   Niel Hummer, NP  Paliperidone Palmitate (INVEGA SUSTENNA) 117 MG/0.75ML SUSP Inject 117 mg into  the muscle every 28 (twenty-eight) days. 05/17/15   Niel Hummer, NP   BP 161/99 mmHg  Pulse 72  Temp(Src) 97.8 F (36.6 C) (Oral)  Resp 18  Ht 5\' 11"  (1.803 m)  Wt 214 lb (97.07 kg)  BMI 29.86 kg/m2  SpO2 100% Physical Exam  Constitutional: He is oriented to person, place, and time. He appears well-developed and well-nourished. No distress.  HENT:  Head: Normocephalic and atraumatic.  Mouth/Throat: Oropharynx is clear and moist.  Eyes: Conjunctivae and EOM are normal. Pupils are equal, round, and reactive to light.  Neck: Normal range of motion. Neck supple. No tracheal deviation present.  Cardiovascular: Normal rate, regular rhythm and normal heart sounds.   Pulmonary/Chest: Effort normal and breath sounds normal. No respiratory distress. He has no wheezes. He has no rales.  Abdominal: Soft.  Musculoskeletal: Normal range of motion.  Neurological: He is alert and oriented to person, place, and time. No cranial nerve deficit. He exhibits normal muscle tone. Coordination normal.  Skin: Skin is warm and dry.  Psychiatric: He has a normal mood and affect. His behavior is normal.  Nursing note and vitals reviewed.   ED Course  Procedures  DIAGNOSTIC STUDIES: Oxygen Saturation is 100% on RA, normal by my interpretation.    COORDINATION OF CARE: 5:36 PM - Discussed plans to order diagnostic studies. Pt advised of plan for treatment and pt agrees.  Labs Review Labs Reviewed  BASIC METABOLIC PANEL - Abnormal; Notable for the following:    Glucose, Bld 147 (*)    All other components within normal limits  CBC WITH DIFFERENTIAL/PLATELET    Imaging Review Ct Head Wo Contrast  05/29/2015   CLINICAL DATA:  Patient here for dizziness and slurred speech, hx of HTN, hx of Stroke, diabetes, no other complaints  EXAM: CT HEAD WITHOUT CONTRAST  TECHNIQUE: Contiguous axial images were obtained from the base of the skull through the vertex without intravenous contrast.  COMPARISON:   01/08/2015  FINDINGS: Ventricles are normal in size and configuration.  There are no parenchymal masses or mass effect. There is no evidence of a cortical infarct. Small focus of hypoattenuation is noted in the subcortical left frontal lobe white matter likely due to chronic microvascular ischemic change.  There are no extra-axial masses or abnormal fluid collections.  There is no intracranial hemorrhage.  Visualized sinuses and mastoid air cells are clear.  IMPRESSION: 1. No acute intracranial abnormalities. 2. No change from the prior head CT.   Electronically Signed   By: Lajean Manes M.D.   On: 05/29/2015 17:12     EKG Interpretation   Date/Time:  Monday May 29 2015 16:34:09 EDT Ventricular Rate:  81 PR Interval:  196 QRS Duration: 100 QT Interval:  350 QTC Calculation: 406 R Axis:   -17 Text Interpretation:  Normal sinus rhythm Possible Left atrial enlargement  Nonspecific T wave abnormality Abnormal ECG Confirmed by Nalanie Winiecki  MD, Javonne Dorko  [phone]) on 05/29/2015 4:51:09 PM      MDM   Final diagnoses:  None    Patient is a 54 year old male with history of hypertension currently in treatment for substance abuse at [facility]. He was brought here for evaluation of elevated blood pressure and slurred speech. He states he has had slurred speech for 4 years since a facial injury that required surgery, however believed it was worse today. His blood pressure here is 140s over 90s and his neurologic exam is nonfocal. His workup reveals normal laboratory studies and negative head CT. I highly doubt a CVA and his blood pressure now appears to be under control. He will be discharged with instructions to follow his blood pressure and return when necessary.  I personally performed the services described in this documentation, which was scribed in my presence. The recorded information has been reviewed and is accurate.      Veryl Speak, MD 05/29/15 [phone]

## 2015-05-29 NOTE — Discharge Instructions (Signed)
Continue your blood pressure medications as before. Keep a record of your blood pressures and present these to your doctor at your next appointment.   Hypertension Hypertension, commonly called high blood pressure, is when the force of blood pumping through your arteries is too strong. Your arteries are the blood vessels that carry blood from your heart throughout your body. A blood pressure reading consists of a higher number over a lower number, such as 110/72. The higher number (systolic) is the pressure inside your arteries when your heart pumps. The lower number (diastolic) is the pressure inside your arteries when your heart relaxes. Ideally you want your blood pressure below 120/80. Hypertension forces your heart to work harder to pump blood. Your arteries may become narrow or stiff. Having hypertension puts you at risk for heart disease, stroke, and other problems.  RISK FACTORS Some risk factors for high blood pressure are controllable. Others are not.  Risk factors you cannot control include:   Race. You may be at higher risk if you are African American.  Age. Risk increases with age.  Gender. Men are at higher risk than women before age 27 years. After age 52, women are at higher risk than men. Risk factors you can control include:  Not getting enough exercise or physical activity.  Being overweight.  Getting too much fat, sugar, calories, or salt in your diet.  Drinking too much alcohol. SIGNS AND SYMPTOMS Hypertension does not usually cause signs or symptoms. Extremely high blood pressure (hypertensive crisis) may cause headache, anxiety, shortness of breath, and nosebleed. DIAGNOSIS  To check if you have hypertension, your health care provider will measure your blood pressure while you are seated, with your arm held at the level of your heart. It should be measured at least twice using the same arm. Certain conditions can cause a difference in blood pressure between your  right and left arms. A blood pressure reading that is higher than normal on one occasion does not mean that you need treatment. If one blood pressure reading is high, ask your health care provider about having it checked again. TREATMENT  Treating high blood pressure includes making lifestyle changes and possibly taking medicine. Living a healthy lifestyle can help lower high blood pressure. You may need to change some of your habits. Lifestyle changes may include:  Following the DASH diet. This diet is high in fruits, vegetables, and whole grains. It is low in salt, red meat, and added sugars.  Getting at least 2 hours of brisk physical activity every week.  Losing weight if necessary.  Not smoking.  Limiting alcoholic beverages.  Learning ways to reduce stress. If lifestyle changes are not enough to get your blood pressure under control, your health care provider may prescribe medicine. You may need to take more than one. Work closely with your health care provider to understand the risks and benefits. HOME CARE INSTRUCTIONS  Have your blood pressure rechecked as directed by your health care provider.   Take medicines only as directed by your health care provider. Follow the directions carefully. Blood pressure medicines must be taken as prescribed. The medicine does not work as well when you skip doses. Skipping doses also puts you at risk for problems.   Do not smoke.   Monitor your blood pressure at home as directed by your health care provider. SEEK MEDICAL CARE IF:   You think you are having a reaction to medicines taken.  You have recurrent headaches or feel dizzy.  You have swelling in your ankles.  You have trouble with your vision. SEEK IMMEDIATE MEDICAL CARE IF:  You develop a severe headache or confusion.  You have unusual weakness, numbness, or feel faint.  You have severe chest or abdominal pain.  You vomit repeatedly.  You have trouble  breathing. MAKE SURE YOU:   Understand these instructions.  Will watch your condition.  Will get help right away if you are not doing well or get worse. Document Released: 11/04/2005 Document Revised: 03/21/2014 Document Reviewed: 08/27/2013 [facility] Patient Information 2015 [place], [place]. This information is not intended to replace advice given to you by your health care provider. Make sure you discuss any questions you have with your health care provider.

## 2015-06-03 ENCOUNTER — Encounter (HOSPITAL_BASED_OUTPATIENT_CLINIC_OR_DEPARTMENT_OTHER): Payer: Self-pay | Admitting: *Deleted

## 2015-06-03 ENCOUNTER — Emergency Department (HOSPITAL_BASED_OUTPATIENT_CLINIC_OR_DEPARTMENT_OTHER)
Admission: EM | Admit: 2015-06-03 | Discharge: 2015-06-04 | Disposition: A | Discharge status: Home / Self Care | Payer: Medicaid Other | Attending: Emergency Medicine | Admitting: Emergency Medicine

## 2015-06-03 DIAGNOSIS — M109 Gout, unspecified: Secondary | ICD-10-CM | POA: Insufficient documentation

## 2015-06-03 DIAGNOSIS — M1990 Unspecified osteoarthritis, unspecified site: Secondary | ICD-10-CM | POA: Insufficient documentation

## 2015-06-03 DIAGNOSIS — Z8659 Personal history of other mental and behavioral disorders: Secondary | ICD-10-CM | POA: Diagnosis not present

## 2015-06-03 DIAGNOSIS — Z8673 Personal history of transient ischemic attack (TIA), and cerebral infarction without residual deficits: Secondary | ICD-10-CM | POA: Diagnosis not present

## 2015-06-03 DIAGNOSIS — Z720 Tobacco use: Secondary | ICD-10-CM | POA: Diagnosis not present

## 2015-06-03 DIAGNOSIS — E119 Type 2 diabetes mellitus without complications: Secondary | ICD-10-CM | POA: Diagnosis not present

## 2015-06-03 DIAGNOSIS — I10 Essential (primary) hypertension: Secondary | ICD-10-CM | POA: Insufficient documentation

## 2015-06-03 DIAGNOSIS — Z79899 Other long term (current) drug therapy: Secondary | ICD-10-CM | POA: Diagnosis not present

## 2015-06-03 DIAGNOSIS — I160 Hypertensive urgency: Secondary | ICD-10-CM

## 2015-06-03 DIAGNOSIS — R51 Headache: Secondary | ICD-10-CM | POA: Diagnosis not present

## 2015-06-03 DIAGNOSIS — Z9119 Patient's noncompliance with other medical treatment and regimen: Secondary | ICD-10-CM | POA: Insufficient documentation

## 2015-06-03 MED ORDER — ACETAMINOPHEN 500 MG PO TABS
1000.0000 mg | ORAL_TABLET | Freq: Once | ORAL | Status: AC
  Administered 2015-06-03: 1000 mg via ORAL
  Filled 2015-06-03: qty 2

## 2015-06-03 MED ORDER — METOPROLOL TARTRATE 50 MG PO TABS
50.0000 mg | ORAL_TABLET | Freq: Once | ORAL | Status: AC
  Administered 2015-06-03: 50 mg via ORAL
  Filled 2015-06-03: qty 1

## 2015-06-03 NOTE — ED Notes (Addendum)
Pt at [facility] rehab. Had a routine BP check and it was high. Sent here for assessment. Takes BP meds and has been taking them. Alert, NAD, calm, interactive, resps e/u, no dyspnea noted, steady gait. Denies: CP or sob. Mentions bilateral temporal head pressure. Copy of MAR and BP readings with pt. Has had clonidine, norvasc, hydralazine and lisinopril. 148/101this am. 173/129 tonight PTA.

## 2015-06-03 NOTE — ED Provider Notes (Signed)
CSN: [id number]     Arrival date & time 06/03/15  2242 History  This chart was scribed for Veryl Speak, MD by Terressa Koyanagi, ED Scribe. This patient was seen in room MH08/MH08 and the patient's care was started at 11:41 PM.   Chief Complaint  Patient presents with  . Hypertension   The history is provided by the patient. No language interpreter was used.   PCP: [facility] HPI Comments: Quandarius Higgens is a 54 y.o. male, with PMHx noted below, directed to the Emergency Department by [facility] rehab for elevated BP and assessment. Pt reports he is compliant with all of his BP meds (clonidine, norvasc, antenolol, hydralazine and lisinopril)  and denies any chest pain or SOB. During exam pt's BP is 161/111. Pt also complains of associated headache onset today.   Past Medical History  Diagnosis Date  . Drug abuse   . Alcohol abuse   . Diabetes mellitus   . Uncontrolled hypertension   . Arthritis   . Gout   . DTs (delirium tremens)     history of   . Crack cocaine use   . Noncompliance with medication regimen   . Depression   . Schizophrenia   . Stroke    Past Surgical History  Procedure Laterality Date  . Mandible reconstruction     Family History  Problem Relation Age of Onset  . Hypertension     History  Substance Use Topics  . Smoking status: Current Every Day Smoker -- 0.25 packs/day    Types: Cigarettes  . Smokeless tobacco: Never Used  . Alcohol Use: No     Comment: [facility]    Review of Systems  Constitutional: Negative for fever and chills.  Respiratory: Negative for shortness of breath.   Cardiovascular: Negative for chest pain.  Neurological: Positive for headaches.  All other systems reviewed and are negative.  Allergies  Review of patient's allergies indicates no active allergies.  Home Medications   Prior to Admission medications   Medication Sig Start Date End Date Taking? Authorizing Provider  amLODipine (NORVASC) 10 MG tablet  Take 1 tablet (10 mg total) by mouth daily. 05/02/15   Niel Hummer, NP  ARIPiprazole (ABILIFY) 5 MG tablet Take 1 tablet (5 mg total) by mouth every evening. 05/02/15   Niel Hummer, NP  atenolol (TENORMIN) 50 MG tablet Take 2 tablets (100 mg total) by mouth daily. 05/02/15   Niel Hummer, NP  cloNIDine (CATAPRES) 0.3 MG tablet Take 1 tablet (0.3 mg total) by mouth 2 (two) times daily. 05/02/15   Niel Hummer, NP  glimepiride (AMARYL) 4 MG tablet Take 1 tablet (4 mg total) by mouth daily with breakfast. 05/03/15   Niel Hummer, NP  hydrALAZINE (APRESOLINE) 25 MG tablet Take 1 tablet (25 mg total) by mouth every 8 (eight) hours. 05/02/15   Niel Hummer, NP  lisinopril (PRINIVIL,ZESTRIL) 20 MG tablet Take 1 tablet (20 mg total) by mouth daily. 05/03/15   Niel Hummer, NP  metFORMIN (GLUCOPHAGE) 1000 MG tablet Take 1 tablet (1,000 mg total) by mouth 2 (two) times daily with a meal. 05/02/15   Niel Hummer, NP  Multiple Vitamin (MULTIVITAMIN WITH MINERALS) TABS tablet Take 1 tablet by mouth daily. 05/02/15   Niel Hummer, NP  naproxen (NAPROSYN) 500 MG tablet Take 500 mg by mouth 2 (two) times daily as needed for mild pain or moderate pain.    Historical Provider, MD  Paliperidone  Palmitate (INVEGA SUSTENNA) 117 MG/0.75ML SUSP Inject 117 mg into the muscle every 28 (twenty-eight) days. 05/17/15   Niel Hummer, NP  simvastatin (ZOCOR) 20 MG tablet Take 1 tablet (20 mg total) by mouth daily at 6 PM. 05/02/15   Niel Hummer, NP   Triage Vitals: BP 170/104 mmHg  Pulse 74  Temp(Src) 97.5 F (36.4 C) (Oral)  Resp 18  Ht 5\' 11"  (1.803 m)  Wt 215 lb (97.523 kg)  BMI 30.00 kg/m2  SpO2 100% Physical Exam  Constitutional: He is oriented to person, place, and time. He appears well-developed and well-nourished.  HENT:  Head: Normocephalic and atraumatic.  Eyes: EOM are normal.  Neck: Normal range of motion.  Cardiovascular: Normal rate, regular rhythm, normal heart sounds and intact distal pulses.    Pulmonary/Chest: Effort normal and breath sounds normal. No respiratory distress.  Abdominal: Soft. He exhibits no distension. There is no tenderness.  Musculoskeletal: Normal range of motion.  Neurological: He is alert and oriented to person, place, and time.  Skin: Skin is warm and dry.  Psychiatric: He has a normal mood and affect. Judgment normal.  Nursing note and vitals reviewed.   ED Course  Procedures (including critical care time) DIAGNOSTIC STUDIES: Oxygen Saturation is 100% on RA, nl by my interpretation.    COORDINATION OF CARE: 11:47 PM-Discussed treatment plan with pt at bedside and pt agreed to plan.   Labs Review Labs Reviewed - No data to display  Imaging Review No results found.   EKG Interpretation None      MDM   Final diagnoses:  None    Patient is a 54 year old male with history of hypertension and schizoaffective disorder. He also has a history of substance abuse and is currently in rehabilitation at [facility]. He was seen by me 4 days ago for elevated blood pressure. At that time he had an unremarkable laboratory studies and his blood pressure improved spontaneously. This evening his blood pressure was checked and was [id number] systolic. For this reason, he was sent to the ER. He denies any chest pain, difficulty breathing, but does admit to a headache. His neurologic exam is nonfocal.  Shortly after arrival his blood pressure was measured and was 170/104. He was given an additional dose of his beta blocker and his blood pressure has improved. I see no indication for further laboratory testing and believe he is appropriate for discharge. I will advise him to take an evening dose of his Tenormin and follow-up with his primary Dr. for future blood pressure recommendations.  I personally performed the services described in this documentation, which was scribed in my presence. The recorded information has been reviewed and is accurate.       Veryl Speak,  MD 06/04/15 [phone]

## 2015-06-04 ENCOUNTER — Emergency Department (HOSPITAL_BASED_OUTPATIENT_CLINIC_OR_DEPARTMENT_OTHER)
Admission: EM | Admit: 2015-06-04 | Discharge: 2015-06-04 | Disposition: A | Discharge status: Home / Self Care | Payer: Medicaid Other | Source: Home / Self Care | Attending: Emergency Medicine | Admitting: Emergency Medicine

## 2015-06-04 ENCOUNTER — Encounter (HOSPITAL_BASED_OUTPATIENT_CLINIC_OR_DEPARTMENT_OTHER): Payer: Self-pay | Admitting: *Deleted

## 2015-06-04 DIAGNOSIS — Z720 Tobacco use: Secondary | ICD-10-CM | POA: Insufficient documentation

## 2015-06-04 DIAGNOSIS — Z79899 Other long term (current) drug therapy: Secondary | ICD-10-CM | POA: Insufficient documentation

## 2015-06-04 DIAGNOSIS — I10 Essential (primary) hypertension: Secondary | ICD-10-CM

## 2015-06-04 DIAGNOSIS — F209 Schizophrenia, unspecified: Secondary | ICD-10-CM | POA: Insufficient documentation

## 2015-06-04 DIAGNOSIS — Z9119 Patient's noncompliance with other medical treatment and regimen: Secondary | ICD-10-CM | POA: Insufficient documentation

## 2015-06-04 DIAGNOSIS — M1990 Unspecified osteoarthritis, unspecified site: Secondary | ICD-10-CM | POA: Insufficient documentation

## 2015-06-04 DIAGNOSIS — F329 Major depressive disorder, single episode, unspecified: Secondary | ICD-10-CM | POA: Insufficient documentation

## 2015-06-04 DIAGNOSIS — E119 Type 2 diabetes mellitus without complications: Secondary | ICD-10-CM | POA: Insufficient documentation

## 2015-06-04 DIAGNOSIS — Z8673 Personal history of transient ischemic attack (TIA), and cerebral infarction without residual deficits: Secondary | ICD-10-CM | POA: Insufficient documentation

## 2015-06-04 MED ORDER — ATENOLOL 50 MG PO TABS: 100.0000 mg | ORAL_TABLET | Freq: Every day | ORAL | Status: DC

## 2015-06-04 MED ORDER — ACETAMINOPHEN 500 MG PO TABS
1000.0000 mg | ORAL_TABLET | Freq: Once | ORAL | Status: AC
  Administered 2015-06-04: 1000 mg via ORAL
  Filled 2015-06-04: qty 2

## 2015-06-04 MED ORDER — ATENOLOL 50 MG PO TABS: ORAL_TABLET | ORAL | Status: DC

## 2015-06-04 NOTE — Discharge Instructions (Signed)

## 2015-06-04 NOTE — ED Notes (Addendum)
Patient states he was here last night for blood pressure issues and BP is still high. Called nursing staff at [facility] they stated the the prescription did not match the discharge instructions. The prescription instructs the patient to take two tablets daily and the discharge instructions states to take two in the morning and one at night. Staff is requesting clarification & patient is unable to get medications filled on the weekend.

## 2015-06-04 NOTE — ED Provider Notes (Signed)
CSN: [id number]     Arrival date & time 06/04/15  0910 History   First MD Initiated Contact with Patient 06/04/15 [phone]     Chief Complaint  Patient presents with  . Hypertension     (Consider location/radiation/quality/duration/timing/severity/associated sxs/prior Treatment) HPI  54 year old male with a history of uncontrolled hypertension presents back to the ER with elevated blood pressure and questions about how he is post-take his medicines. He was seen here last night for recurrent hypertension and the ED physician changed his atenolol to 100 mg in the morning and 50 mg at night. This was written on his AVS. However the prescription was written as 100 mg twice a day. The patient states that his blood pressure this morning was 0000000 systolic when they checked at [facility] and so he came back in for evaluation. He states he takes many medicines and took the medicines he was given this morning but is not sure if the atenolol was one of them. When the nursing staff called, it seems that they had not given it time because of the confusion with the AVS versus the prescription. Patient has a headache that he states he always gets whenever his blood pressure goes high without weakness or numbness. Denies any chest pain.  Past Medical History  Diagnosis Date  . Drug abuse   . Alcohol abuse   . Diabetes mellitus   . Uncontrolled hypertension   . Arthritis   . Gout   . DTs (delirium tremens)     history of   . Crack cocaine use   . Noncompliance with medication regimen   . Depression   . Schizophrenia   . Stroke    Past Surgical History  Procedure Laterality Date  . Mandible reconstruction     Family History  Problem Relation Age of Onset  . Hypertension     History  Substance Use Topics  . Smoking status: Current Every Day Smoker -- 0.25 packs/day    Types: Cigarettes  . Smokeless tobacco: Never Used  . Alcohol Use: No     Comment: [facility]    Review of Systems   Respiratory: Negative for shortness of breath.   Cardiovascular: Negative for chest pain.  Neurological: Positive for headaches. Negative for weakness and numbness.  All other systems reviewed and are negative.     Allergies  Review of patient's allergies indicates no known allergies.  Home Medications   Prior to Admission medications   Medication Sig Start Date End Date Taking? Authorizing Provider  amLODipine (NORVASC) 10 MG tablet Take 1 tablet (10 mg total) by mouth daily. 05/02/15   Niel Hummer, NP  ARIPiprazole (ABILIFY) 5 MG tablet Take 1 tablet (5 mg total) by mouth every evening. 05/02/15   Niel Hummer, NP  atenolol (TENORMIN) 50 MG tablet Take 2 tablets (100 mg total) by mouth daily. 06/04/15   Veryl Speak, MD  cloNIDine (CATAPRES) 0.3 MG tablet Take 1 tablet (0.3 mg total) by mouth 2 (two) times daily. 05/02/15   Niel Hummer, NP  glimepiride (AMARYL) 4 MG tablet Take 1 tablet (4 mg total) by mouth daily with breakfast. 05/03/15   Niel Hummer, NP  hydrALAZINE (APRESOLINE) 25 MG tablet Take 1 tablet (25 mg total) by mouth every 8 (eight) hours. Patient taking differently: Take 25 mg by mouth 2 (two) times daily.  05/02/15   Niel Hummer, NP  lisinopril (PRINIVIL,ZESTRIL) 20 MG tablet Take 1 tablet (20 mg total) by mouth daily.  05/03/15   Niel Hummer, NP  metFORMIN (GLUCOPHAGE) 1000 MG tablet Take 1 tablet (1,000 mg total) by mouth 2 (two) times daily with a meal. 05/02/15   Niel Hummer, NP  Multiple Vitamin (MULTIVITAMIN WITH MINERALS) TABS tablet Take 1 tablet by mouth daily. 05/02/15   Niel Hummer, NP  naproxen (NAPROSYN) 500 MG tablet Take 500 mg by mouth 2 (two) times daily as needed for mild pain or moderate pain.    Historical Provider, MD  Paliperidone Palmitate (INVEGA SUSTENNA) 117 MG/0.75ML SUSP Inject 117 mg into the muscle every 28 (twenty-eight) days. 05/17/15   Niel Hummer, NP  simvastatin (ZOCOR) 20 MG tablet Take 1 tablet (20 mg total) by mouth daily at 6  PM. 05/02/15   Niel Hummer, NP   BP 172/114 mmHg  Pulse 74  Temp(Src) 97.9 F (36.6 C) (Oral)  Resp 20  SpO2 99% Physical Exam  Constitutional: He is oriented to person, place, and time. He appears well-developed and well-nourished. No distress.  HENT:  Head: Normocephalic and atraumatic.  Right Ear: External ear normal.  Left Ear: External ear normal.  Nose: Nose normal.  Eyes: Right eye exhibits no discharge. Left eye exhibits no discharge.  Neck: Neck supple.  Cardiovascular: Normal rate, regular rhythm, normal heart sounds and intact distal pulses.   Pulmonary/Chest: Effort normal.  Abdominal: Soft. He exhibits no distension. There is no tenderness.  Musculoskeletal: He exhibits no edema.  Neurological: He is alert and oriented to person, place, and time.  CN II-12 grossly intact. 5/5 strength in all 4 extremities. Grossly normal sensation.  Skin: Skin is warm and dry. He is not diaphoretic.  Nursing note and vitals reviewed.   ED Course  Procedures (including critical care time) Labs Review Labs Reviewed - No data to display  Imaging Review No results found.   EKG Interpretation None      MDM   Final diagnoses:  Essential hypertension    Patient remains hypertensive but has no signs of endorgan damage. I reviewed lab work from 05/29/15 that is unremarkable. Do not feel repeat imaging, lab work, or EKG would be indicated. The AVS states 100 mg warning, 50 mg at night. Given this typed out I have a feeling this with Dr. Stark Jock was intending. I will write a new prescription that reflects this. At this point he is hypertensive but has a benign exam. Will give Tylenol at his request for headache. He has a significant history of uncontrolled hypertension, I have recommended he follow-up as it is possible with his PCP for better blood pressure management.    Sherwood Gambler, MD 06/04/15 1000

## 2015-06-04 NOTE — Discharge Instructions (Signed)
Increase your Tenormin to 100 mg in the morning and 50 mg in the evening.  Continue all other medications as before.   Hypertension Hypertension, commonly called high blood pressure, is when the force of blood pumping through your arteries is too strong. Your arteries are the blood vessels that carry blood from your heart throughout your body. A blood pressure reading consists of a higher number over a lower number, such as 110/72. The higher number (systolic) is the pressure inside your arteries when your heart pumps. The lower number (diastolic) is the pressure inside your arteries when your heart relaxes. Ideally you want your blood pressure below 120/80. Hypertension forces your heart to work harder to pump blood. Your arteries may become narrow or stiff. Having hypertension puts you at risk for heart disease, stroke, and other problems.  RISK FACTORS Some risk factors for high blood pressure are controllable. Others are not.  Risk factors you cannot control include:   Race. You may be at higher risk if you are African American.  Age. Risk increases with age.  Gender. Men are at higher risk than women before age 24 years. After age 52, women are at higher risk than men. Risk factors you can control include:  Not getting enough exercise or physical activity.  Being overweight.  Getting too much fat, sugar, calories, or salt in your diet.  Drinking too much alcohol. SIGNS AND SYMPTOMS Hypertension does not usually cause signs or symptoms. Extremely high blood pressure (hypertensive crisis) may cause headache, anxiety, shortness of breath, and nosebleed. DIAGNOSIS  To check if you have hypertension, your health care provider will measure your blood pressure while you are seated, with your arm held at the level of your heart. It should be measured at least twice using the same arm. Certain conditions can cause a difference in blood pressure between your right and left arms. A blood  pressure reading that is higher than normal on one occasion does not mean that you need treatment. If one blood pressure reading is high, ask your health care provider about having it checked again. TREATMENT  Treating high blood pressure includes making lifestyle changes and possibly taking medicine. Living a healthy lifestyle can help lower high blood pressure. You may need to change some of your habits. Lifestyle changes may include:  Following the DASH diet. This diet is high in fruits, vegetables, and whole grains. It is low in salt, red meat, and added sugars.  Getting at least 2 hours of brisk physical activity every week.  Losing weight if necessary.  Not smoking.  Limiting alcoholic beverages.  Learning ways to reduce stress. If lifestyle changes are not enough to get your blood pressure under control, your health care provider may prescribe medicine. You may need to take more than one. Work closely with your health care provider to understand the risks and benefits. HOME CARE INSTRUCTIONS  Have your blood pressure rechecked as directed by your health care provider.   Take medicines only as directed by your health care provider. Follow the directions carefully. Blood pressure medicines must be taken as prescribed. The medicine does not work as well when you skip doses. Skipping doses also puts you at risk for problems.   Do not smoke.   Monitor your blood pressure at home as directed by your health care provider. SEEK MEDICAL CARE IF:   You think you are having a reaction to medicines taken.  You have recurrent headaches or feel dizzy.  You have  swelling in your ankles.  You have trouble with your vision. SEEK IMMEDIATE MEDICAL CARE IF:  You develop a severe headache or confusion.  You have unusual weakness, numbness, or feel faint.  You have severe chest or abdominal pain.  You vomit repeatedly.  You have trouble breathing. MAKE SURE YOU:   Understand  these instructions.  Will watch your condition.  Will get help right away if you are not doing well or get worse. Document Released: 11/04/2005 Document Revised: 03/21/2014 Document Reviewed: 08/27/2013 [facility] Patient Information 2015 [place], [place]. This information is not intended to replace advice given to you by your health care provider. Make sure you discuss any questions you have with your health care provider.

## 2015-06-15 ENCOUNTER — Encounter (HOSPITAL_BASED_OUTPATIENT_CLINIC_OR_DEPARTMENT_OTHER): Payer: Self-pay | Admitting: Emergency Medicine

## 2015-06-15 ENCOUNTER — Other Ambulatory Visit: Payer: Self-pay

## 2015-06-15 ENCOUNTER — Emergency Department (HOSPITAL_BASED_OUTPATIENT_CLINIC_OR_DEPARTMENT_OTHER)
Admission: EM | Admit: 2015-06-15 | Discharge: 2015-06-15 | Disposition: A | Discharge status: Home / Self Care | Payer: Medicaid Other | Attending: Emergency Medicine | Admitting: Emergency Medicine

## 2015-06-15 DIAGNOSIS — Z9119 Patient's noncompliance with other medical treatment and regimen: Secondary | ICD-10-CM | POA: Insufficient documentation

## 2015-06-15 DIAGNOSIS — Z79899 Other long term (current) drug therapy: Secondary | ICD-10-CM | POA: Diagnosis not present

## 2015-06-15 DIAGNOSIS — F329 Major depressive disorder, single episode, unspecified: Secondary | ICD-10-CM | POA: Diagnosis not present

## 2015-06-15 DIAGNOSIS — I10 Essential (primary) hypertension: Secondary | ICD-10-CM | POA: Diagnosis not present

## 2015-06-15 DIAGNOSIS — R739 Hyperglycemia, unspecified: Secondary | ICD-10-CM

## 2015-06-15 DIAGNOSIS — E1165 Type 2 diabetes mellitus with hyperglycemia: Secondary | ICD-10-CM | POA: Insufficient documentation

## 2015-06-15 DIAGNOSIS — Z8673 Personal history of transient ischemic attack (TIA), and cerebral infarction without residual deficits: Secondary | ICD-10-CM | POA: Insufficient documentation

## 2015-06-15 DIAGNOSIS — Z720 Tobacco use: Secondary | ICD-10-CM | POA: Diagnosis not present

## 2015-06-15 DIAGNOSIS — M109 Gout, unspecified: Secondary | ICD-10-CM | POA: Insufficient documentation

## 2015-06-15 DIAGNOSIS — M1990 Unspecified osteoarthritis, unspecified site: Secondary | ICD-10-CM | POA: Diagnosis not present

## 2015-06-15 DIAGNOSIS — F209 Schizophrenia, unspecified: Secondary | ICD-10-CM | POA: Diagnosis not present

## 2015-06-15 LAB — BASIC METABOLIC PANEL
Anion gap: 11 (ref 5–15)
BUN: 11 mg/dL (ref 6–20)
CO2: 26 mmol/L (ref 22–32)
Calcium: 9.5 mg/dL (ref 8.9–10.3)
Chloride: 103 mmol/L (ref 101–111)
Creatinine, Ser: 0.78 mg/dL (ref 0.61–1.24)
GLUCOSE: 247 mg/dL — AB (ref 65–99)
POTASSIUM: 3.8 mmol/L (ref 3.5–5.1)
SODIUM: 140 mmol/L (ref 135–145)

## 2015-06-15 LAB — URINALYSIS, ROUTINE W REFLEX MICROSCOPIC
BILIRUBIN URINE: NEGATIVE
Glucose, UA: NEGATIVE mg/dL
HGB URINE DIPSTICK: NEGATIVE
KETONES UR: NEGATIVE mg/dL
Leukocytes, UA: NEGATIVE
Nitrite: NEGATIVE
PH: 5.5 (ref 5.0–8.0)
PROTEIN: NEGATIVE mg/dL
Specific Gravity, Urine: 1.006 (ref 1.005–1.030)
UROBILINOGEN UA: 0.2 mg/dL (ref 0.0–1.0)

## 2015-06-15 LAB — CBC
HEMATOCRIT: 40.3 % (ref 39.0–52.0)
Hemoglobin: 13.1 g/dL (ref 13.0–17.0)
MCH: 29 pg (ref 26.0–34.0)
MCHC: 32.5 g/dL (ref 30.0–36.0)
MCV: 89.2 fL (ref 78.0–100.0)
Platelets: 143 10*3/uL — ABNORMAL LOW (ref 150–400)
RBC: 4.52 MIL/uL (ref 4.22–5.81)
RDW: 11.7 % (ref 11.5–15.5)
WBC: 6.6 10*3/uL (ref 4.0–10.5)

## 2015-06-15 LAB — CBG MONITORING, ED: Glucose-Capillary: 242 mg/dL — ABNORMAL HIGH (ref 65–99)

## 2015-06-15 NOTE — ED Notes (Signed)
Here from [facility] for elevated blood sugar and hypertension

## 2015-06-15 NOTE — ED Provider Notes (Signed)
CSN: [id number]     Arrival date & time 06/15/15  0830 History   First MD Initiated Contact with Patient 06/15/15 [phone]     Chief Complaint  Patient presents with  . Hyperglycemia     (Consider location/radiation/quality/duration/timing/severity/associated sxs/prior Treatment) HPI  Pt presents from [facility] for evaluation of elevated blood sugar and hypertension.  He is on multiple bp meds and has been taking them while he is at [employer].  He states his program ends next week.  He denies chest pain, no shortness of breath.  No focal weakness or numbness.  No headache or changes in vision or speech.   No vomiting.  No febrile illness.  No leg swelling.  There are no other associated systemic symptoms, there are no other alleviating or modifying factors.   Past Medical History  Diagnosis Date  . Drug abuse   . Alcohol abuse   . Diabetes mellitus   . Uncontrolled hypertension   . Arthritis   . Gout   . DTs (delirium tremens)     history of   . Crack cocaine use   . Noncompliance with medication regimen   . Depression   . Schizophrenia   . Stroke    Past Surgical History  Procedure Laterality Date  . Mandible reconstruction     Family History  Problem Relation Age of Onset  . Hypertension     History  Substance Use Topics  . Smoking status: Current Every Day Smoker -- 0.25 packs/day    Types: Cigarettes  . Smokeless tobacco: Never Used  . Alcohol Use: No     Comment: [facility]    Review of Systems  ROS reviewed and all otherwise negative except for mentioned in HPI    Allergies  Review of patient's allergies indicates no known allergies.  Home Medications   Prior to Admission medications   Medication Sig Start Date End Date Taking? Authorizing Provider  amLODipine (NORVASC) 10 MG tablet Take 1 tablet (10 mg total) by mouth daily. 05/02/15   Niel Hummer, NP  ARIPiprazole (ABILIFY) 5 MG tablet Take 1 tablet (5 mg total) by mouth every evening. 05/02/15   Niel Hummer, NP  atenolol (TENORMIN) 50 MG tablet Take 100 mg (2 tablets) by mouth in the morning, and 50 mg (1 tablet) by mouth in the evening. 06/04/15   Sherwood Gambler, MD  cloNIDine (CATAPRES) 0.3 MG tablet Take 1 tablet (0.3 mg total) by mouth 2 (two) times daily. 05/02/15   Niel Hummer, NP  glimepiride (AMARYL) 4 MG tablet Take 1 tablet (4 mg total) by mouth daily with breakfast. 05/03/15   Niel Hummer, NP  hydrALAZINE (APRESOLINE) 25 MG tablet Take 1 tablet (25 mg total) by mouth every 8 (eight) hours. Patient taking differently: Take 25 mg by mouth 2 (two) times daily.  05/02/15   Niel Hummer, NP  lisinopril (PRINIVIL,ZESTRIL) 20 MG tablet Take 1 tablet (20 mg total) by mouth daily. 05/03/15   Niel Hummer, NP  metFORMIN (GLUCOPHAGE) 1000 MG tablet Take 1 tablet (1,000 mg total) by mouth 2 (two) times daily with a meal. 05/02/15   Niel Hummer, NP  Multiple Vitamin (MULTIVITAMIN WITH MINERALS) TABS tablet Take 1 tablet by mouth daily. 05/02/15   Niel Hummer, NP  naproxen (NAPROSYN) 500 MG tablet Take 500 mg by mouth 2 (two) times daily as needed for mild pain or moderate pain.    Historical Provider, MD  Paliperidone Palmitate (INVEGA  SUSTENNA) 117 MG/0.75ML SUSP Inject 117 mg into the muscle every 28 (twenty-eight) days. 05/17/15   Niel Hummer, NP  simvastatin (ZOCOR) 20 MG tablet Take 1 tablet (20 mg total) by mouth daily at 6 PM. 05/02/15   Niel Hummer, NP   BP 140/101 mmHg  Pulse 74  Temp(Src) 97.8 F (36.6 C) (Oral)  Resp 16  Ht 5\' 11"  (1.803 m)  Wt 218 lb (98.884 kg)  BMI 30.42 kg/m2  SpO2 98%  Vitals reviewed Physical Exam  Physical Examination: General appearance - alert, well appearing, and in no distress Mental status - alert, oriented to person, place, and time Eyes - no conjunctival injection, no scleral icterus Mouth - mucous membranes moist, pharynx normal without lesions Chest - clear to auscultation, no wheezes, rales or rhonchi, symmetric air entry Heart - normal  rate, regular rhythm, normal S1, S2, no murmurs, rubs, clicks or gallops Abdomen - soft, nontender, nondistended, no masses or organomegaly Neurological - alert, oriented, normal speech, strength 5/5 in extremities x 4, sensation intact,  Extremities - peripheral pulses normal, no pedal edema, no clubbing or cyanosis Skin - normal coloration and turgor, no rashes  ED Course  Procedures (including critical care time)  ED ECG REPORT   Date: 06/15/2015  Rate: 71  Rhythm: normal sinus rhythm  QRS Axis: normal  Intervals: normal  ST/T Wave abnormalities: nonspecific T wave changes  Conduction Disutrbances:none  Narrative Interpretation:   Old EKG Reviewed: none available  EKG not avaialbe in epic for interpretation into [facility] Labs Review Labs Reviewed  CBC - Abnormal; Notable for the following:    Platelets 143 (*)    All other components within normal limits  BASIC METABOLIC PANEL - Abnormal; Notable for the following:    Glucose, Bld 247 (*)    All other components within normal limits  CBG MONITORING, ED - Abnormal; Notable for the following:    Glucose-Capillary 242 (*)    All other components within normal limits  URINALYSIS, ROUTINE W REFLEX MICROSCOPIC (NOT AT [facility])    Imaging Review No results found.   EKG Interpretation   Date/Time:  Thursday June 15 2015 08:55:47 EDT Ventricular Rate:  71 PR Interval:  198 QRS Duration: 98 QT Interval:  384 QTC Calculation: 417 R Axis:   57 Text Interpretation:  Normal sinus rhythm Possible Left atrial enlargement  T wave abnormality, consider lateral ischemia Abnormal ECG ED PHYSICIAN  INTERPRETATION AVAILABLE IN [facility] Confirmed by TEST, Record  ([id number]) on 06/16/2015 7:21:36 AM      MDM   Final diagnoses:  Essential hypertension  Hyperglycemia    Pt presenting with ongoing hypertension, also mild hyperglycemia- not in DKA.  No signs of end organ damage.  Pt is in [facility] and has been to the ED multiple  times recently for blood pressure management.  He has been advised to arrange for follow up with his primary care doctor for blood pressure management.  He states he will call today- his program ends next week so he will be able to go for appointment then.  Discharged with strict return precautions.  Pt agreeable with plan.  Prior records reviewed and considered during this visit   [name], MD 06/17/15 [phone]

## 2015-06-15 NOTE — ED Notes (Signed)
Patient preparing for discharge. 

## 2015-06-15 NOTE — Discharge Instructions (Signed)
Return to the ED with any concerns including chest pain, difficulty breathing, weakness of arms or legs, changes in vision or speech, vomiting and not able to keep down liquids, decreased level of alertness/lethargy, or any other alarming symptoms

## 2015-06-29 ENCOUNTER — Emergency Department (HOSPITAL_COMMUNITY)
Admission: EM | Admit: 2015-06-29 | Discharge: 2015-06-29 | Disposition: A | Discharge status: Home / Self Care | Payer: Medicaid Other | Attending: Emergency Medicine | Admitting: Emergency Medicine

## 2015-06-29 ENCOUNTER — Encounter (HOSPITAL_COMMUNITY): Payer: Self-pay | Admitting: Adult Health

## 2015-06-29 DIAGNOSIS — M10071 Idiopathic gout, right ankle and foot: Secondary | ICD-10-CM | POA: Diagnosis not present

## 2015-06-29 DIAGNOSIS — Z79899 Other long term (current) drug therapy: Secondary | ICD-10-CM | POA: Diagnosis not present

## 2015-06-29 DIAGNOSIS — F329 Major depressive disorder, single episode, unspecified: Secondary | ICD-10-CM | POA: Insufficient documentation

## 2015-06-29 DIAGNOSIS — I10 Essential (primary) hypertension: Secondary | ICD-10-CM | POA: Diagnosis not present

## 2015-06-29 DIAGNOSIS — F209 Schizophrenia, unspecified: Secondary | ICD-10-CM | POA: Insufficient documentation

## 2015-06-29 DIAGNOSIS — R202 Paresthesia of skin: Secondary | ICD-10-CM | POA: Diagnosis not present

## 2015-06-29 DIAGNOSIS — Z8673 Personal history of transient ischemic attack (TIA), and cerebral infarction without residual deficits: Secondary | ICD-10-CM | POA: Diagnosis not present

## 2015-06-29 DIAGNOSIS — E119 Type 2 diabetes mellitus without complications: Secondary | ICD-10-CM | POA: Diagnosis not present

## 2015-06-29 DIAGNOSIS — Z720 Tobacco use: Secondary | ICD-10-CM | POA: Diagnosis not present

## 2015-06-29 DIAGNOSIS — M79671 Pain in right foot: Secondary | ICD-10-CM | POA: Diagnosis present

## 2015-06-29 MED ORDER — NAPROXEN 500 MG PO TABS: 500.0000 mg | ORAL_TABLET | Freq: Two times a day (BID) | ORAL | Status: DC

## 2015-06-29 MED ORDER — HYDROCODONE-ACETAMINOPHEN 7.5-325 MG PO TABS: 1.0000 | ORAL_TABLET | Freq: Four times a day (QID) | ORAL | Status: DC | PRN

## 2015-06-29 MED ORDER — OXYCODONE-ACETAMINOPHEN 5-325 MG PO TABS
1.0000 | ORAL_TABLET | Freq: Once | ORAL | Status: AC
  Administered 2015-06-29: 1 via ORAL
  Filled 2015-06-29: qty 1

## 2015-06-29 NOTE — ED Provider Notes (Signed)
CSN: [id number]     Arrival date & time 06/29/15  1545 History  This chart was scribed for [facility], NP, working with Deno Etienne, DO by Starleen Arms, ED Scribe. This patient was seen in room TR11C/TR11C and the patient's care was started at 4:15 PM.   Chief Complaint  Patient presents with  . Foot Pain   Patient is a 54 y.o. male presenting with lower extremity pain. The history is provided by the patient. No language interpreter was used.  Foot Pain This is a recurrent problem. The current episode started yesterday. The problem occurs constantly. The problem has been gradually worsening. Pertinent negatives include no chest pain, no abdominal pain, no headaches and no shortness of breath. The symptoms are aggravated by walking and standing. Nothing relieves the symptoms. He has tried nothing for the symptoms.   HPI Comments: [name] is a 54 y.o. male with a hx of arthritis, gout, DM, HTN who presents to the Emergency Department complaining of worsening right foot pain and redness.  The complaint is intermittently described as tingling.  He reports this episode is typical of his gout flare.  Bearing weight and walking aggravate the complaint and.  No medications have been tried.  His most recent treatment for gout flare approximately 3 months ago.   He denies diet changes, injury, calf pain.   NKA.   Past Medical History  Diagnosis Date  . Drug abuse   . Alcohol abuse   . Diabetes mellitus   . Uncontrolled hypertension   . Arthritis   . Gout   . DTs (delirium tremens)     history of   . Crack cocaine use   . Noncompliance with medication regimen   . Depression   . Schizophrenia   . Stroke    Past Surgical History  Procedure Laterality Date  . Mandible reconstruction     Family History  Problem Relation Age of Onset  . Hypertension     Social History  Substance Use Topics  . Smoking status: Current Every Day Smoker -- 0.25 packs/day    Types: Cigarettes  .  Smokeless tobacco: Never Used  . Alcohol Use: No     Comment: [facility]    Review of Systems  Respiratory: Negative for shortness of breath.   Cardiovascular: Negative for chest pain.  Gastrointestinal: Negative for abdominal pain.  Musculoskeletal: Positive for arthralgias.       Pain right great toe  Skin: Positive for color change.  Neurological: Negative for headaches.  All other systems reviewed and are negative.     Allergies  Review of patient's allergies indicates no known allergies.  Home Medications   Prior to Admission medications   Medication Sig Start Date End Date Taking? Authorizing Provider  amLODipine (NORVASC) 10 MG tablet Take 1 tablet (10 mg total) by mouth daily. 05/02/15   Niel Hummer, NP  ARIPiprazole (ABILIFY) 5 MG tablet Take 1 tablet (5 mg total) by mouth every evening. 05/02/15   Niel Hummer, NP  atenolol (TENORMIN) 50 MG tablet Take 100 mg (2 tablets) by mouth in the morning, and 50 mg (1 tablet) by mouth in the evening. 06/04/15   Sherwood Gambler, MD  cloNIDine (CATAPRES) 0.3 MG tablet Take 1 tablet (0.3 mg total) by mouth 2 (two) times daily. 05/02/15   Niel Hummer, NP  glimepiride (AMARYL) 4 MG tablet Take 1 tablet (4 mg total) by mouth daily with breakfast. 05/03/15   Hewitt Shorts  Rosana Hoes, NP  hydrALAZINE (APRESOLINE) 25 MG tablet Take 1 tablet (25 mg total) by mouth every 8 (eight) hours. Patient taking differently: Take 25 mg by mouth 2 (two) times daily.  05/02/15   Niel Hummer, NP  HYDROcodone-acetaminophen (NORCO) 7.5-325 MG per tablet Take 1 tablet by mouth every 6 (six) hours as needed for moderate pain. 06/29/15   Johnson Arizola Bunnie Pion, NP  lisinopril (PRINIVIL,ZESTRIL) 20 MG tablet Take 1 tablet (20 mg total) by mouth daily. 05/03/15   Niel Hummer, NP  metFORMIN (GLUCOPHAGE) 1000 MG tablet Take 1 tablet (1,000 mg total) by mouth 2 (two) times daily with a meal. 05/02/15   Niel Hummer, NP  Multiple Vitamin (MULTIVITAMIN WITH MINERALS) TABS tablet Take  1 tablet by mouth daily. 05/02/15   Niel Hummer, NP  naproxen (NAPROSYN) 500 MG tablet Take 1 tablet (500 mg total) by mouth 2 (two) times daily. 06/29/15   Immanuel Fedak Bunnie Pion, NP  Paliperidone Palmitate (INVEGA SUSTENNA) 117 MG/0.75ML SUSP Inject 117 mg into the muscle every 28 (twenty-eight) days. 05/17/15   Niel Hummer, NP  simvastatin (ZOCOR) 20 MG tablet Take 1 tablet (20 mg total) by mouth daily at 6 PM. 05/02/15   Niel Hummer, NP   BP 154/98 mmHg  Pulse 100  Temp(Src) 98.5 F (36.9 C) (Oral)  Resp 16  SpO2 100% Physical Exam  Constitutional: He is oriented to person, place, and time. He appears well-developed and well-nourished. No distress.  HENT:  Head: Normocephalic and atraumatic.  Eyes: Conjunctivae and EOM are normal.  Neck: Neck supple. No tracheal deviation present.  Cardiovascular: Normal rate.   Pulmonary/Chest: Effort normal. No respiratory distress.  Musculoskeletal:       Right foot: There is tenderness and swelling.       Feet:  Pedal pulses 2+ bilaterally.  Left great toe has mild erythema and increased warmth.  Tender with palpation and any ROM of the big toe.  Adequate circulation.  Intact sensation. No red streaking or signs of infection  Neurological: He is alert and oriented to person, place, and time.  Skin: Skin is warm and dry.  Psychiatric: He has a normal mood and affect. His behavior is normal.  Nursing note and vitals reviewed.   ED Course  Procedures (including critical care time)  DIAGNOSTIC STUDIES: Oxygen Saturation is 100% on RA, normal by my interpretation.    COORDINATION OF CARE:    4:21 PM Discussed treatment plan with patient at bedside.  Patient acknowledges and agrees with plan.    4:27 PM Patient's BP elevated.  Patient with known history of HTN.  He reports he has not taken his BP medication today.   Labs Review Labs Reviewed - No data to display   MDM  54 y.o. male with right great toe pain, swelling and increased warmth.  Stable for d/c without red streaking, fever or signs of infection. Will treat for gout and patient to follow up  With his PCP. Discussed elevated BP and patient states he will take his medication.  Final diagnoses:  Acute idiopathic gout of right foot   I personally performed the services described in this documentation, which was scribed in my presence. The recorded information has been reviewed and is accurate.    [place], NP 06/29/15 [place], DO 06/29/15 [phone]

## 2015-06-29 NOTE — ED Notes (Addendum)
Presents with right foot pain that pt describes as gout arthritis, no swelling noted. Pt states, "I have no money for medicine  and everything hurts" pt would like something for pain and he would like to be discharged quickly.

## 2015-06-29 NOTE — Discharge Instructions (Signed)
It is important that you follow up with your doctor as soon as possible. Call tomorrow for appointment.  Gout Gout is an inflammatory arthritis caused by a buildup of uric acid crystals in the joints. Uric acid is a chemical that is normally present in the blood. When the level of uric acid in the blood is too high it can form crystals that deposit in your joints and tissues. This causes joint redness, soreness, and swelling (inflammation). Repeat attacks are common. Over time, uric acid crystals can form into masses (tophi) near a joint, destroying bone and causing disfigurement. Gout is treatable and often preventable. CAUSES  The disease begins with elevated levels of uric acid in the blood. Uric acid is produced by your body when it breaks down a naturally found substance called purines. Certain foods you eat, such as meats and fish, contain high amounts of purines. Causes of an elevated uric acid level include:  Being passed down from parent to child (heredity).  Diseases that cause increased uric acid production (such as obesity, psoriasis, and certain cancers).  Excessive alcohol use.  Diet, especially diets rich in meat and seafood.  Medicines, including certain cancer-fighting medicines (chemotherapy), water pills (diuretics), and aspirin.  Chronic kidney disease. The kidneys are no longer able to remove uric acid well.  Problems with metabolism. Conditions strongly associated with gout include:  Obesity.  High blood pressure.  High cholesterol.  Diabetes. Not everyone with elevated uric acid levels gets gout. It is not understood why some people get gout and others do not. Surgery, joint injury, and eating too much of certain foods are some of the factors that can lead to gout attacks. SYMPTOMS   An attack of gout comes on quickly. It causes intense pain with redness, swelling, and warmth in a joint.  Fever can occur.  Often, only one joint is involved. Certain joints  are more commonly involved:  Base of the big toe.  Knee.  Ankle.  Wrist.  Finger. Without treatment, an attack usually goes away in a few days to weeks. Between attacks, you usually will not have symptoms, which is different from many other forms of arthritis. DIAGNOSIS  Your caregiver will suspect gout based on your symptoms and exam. In some cases, tests may be recommended. The tests may include:  Blood tests.  Urine tests.  X-rays.  Joint fluid exam. This exam requires a needle to remove fluid from the joint (arthrocentesis). Using a microscope, gout is confirmed when uric acid crystals are seen in the joint fluid. TREATMENT  There are two phases to gout treatment: treating the sudden onset (acute) attack and preventing attacks (prophylaxis).  Treatment of an Acute Attack.  Medicines are used. These include anti-inflammatory medicines or steroid medicines.  An injection of steroid medicine into the affected joint is sometimes necessary.  The painful joint is rested. Movement can worsen the arthritis.  You may use warm or cold treatments on painful joints, depending which works best for you.  Treatment to Prevent Attacks.  If you suffer from frequent gout attacks, your caregiver may advise preventive medicine. These medicines are started after the acute attack subsides. These medicines either help your kidneys eliminate uric acid from your body or decrease your uric acid production. You may need to stay on these medicines for a very long time.  The early phase of treatment with preventive medicine can be associated with an increase in acute gout attacks. For this reason, during the first few months of  treatment, your caregiver may also advise you to take medicines usually used for acute gout treatment. Be sure you understand your caregiver's directions. Your caregiver may make several adjustments to your medicine dose before these medicines are effective.  Discuss dietary  treatment with your caregiver or dietitian. Alcohol and drinks high in sugar and fructose and foods such as meat, poultry, and seafood can increase uric acid levels. Your caregiver or dietitian can advise you on drinks and foods that should be limited. HOME CARE INSTRUCTIONS   Do not take aspirin to relieve pain. This raises uric acid levels.  Only take over-the-counter or prescription medicines for pain, discomfort, or fever as directed by your caregiver.  Rest the joint as much as possible. When in bed, keep sheets and blankets off painful areas.  Keep the affected joint raised (elevated).  Apply warm or cold treatments to painful joints. Use of warm or cold treatments depends on which works best for you.  Use crutches if the painful joint is in your leg.  Drink enough fluids to keep your urine clear or pale yellow. This helps your body get rid of uric acid. Limit alcohol, sugary drinks, and fructose drinks.  Follow your dietary instructions. Pay careful attention to the amount of protein you eat. Your daily diet should emphasize fruits, vegetables, whole grains, and fat-free or low-fat milk products. Discuss the use of coffee, vitamin C, and cherries with your caregiver or dietitian. These may be helpful in lowering uric acid levels.  Maintain a healthy body weight. SEEK MEDICAL CARE IF:   You develop diarrhea, vomiting, or any side effects from medicines.  You do not feel better in 24 hours, or you are getting worse. SEEK IMMEDIATE MEDICAL CARE IF:   Your joint becomes suddenly more tender, and you have chills or a fever. MAKE SURE YOU:   Understand these instructions.  Will watch your condition.  Will get help right away if you are not doing well or get worse. Document Released: 11/01/2000 Document Revised: 03/21/2014 Document Reviewed: 06/17/2012 [facility] Patient Information 2015 [place], [place]. This information is not intended to replace advice given to you by your health  care provider. Make sure you discuss any questions you have with your health care provider.

## 2015-07-03 ENCOUNTER — Emergency Department (HOSPITAL_COMMUNITY)
Admission: EM | Admit: 2015-07-03 | Discharge: 2015-07-03 | Disposition: A | Discharge status: Home / Self Care | Payer: Medicaid Other | Attending: Emergency Medicine | Admitting: Emergency Medicine

## 2015-07-03 ENCOUNTER — Encounter (HOSPITAL_COMMUNITY): Payer: Self-pay | Admitting: Emergency Medicine

## 2015-07-03 DIAGNOSIS — Z720 Tobacco use: Secondary | ICD-10-CM | POA: Insufficient documentation

## 2015-07-03 DIAGNOSIS — F329 Major depressive disorder, single episode, unspecified: Secondary | ICD-10-CM | POA: Insufficient documentation

## 2015-07-03 DIAGNOSIS — Z8673 Personal history of transient ischemic attack (TIA), and cerebral infarction without residual deficits: Secondary | ICD-10-CM | POA: Diagnosis not present

## 2015-07-03 DIAGNOSIS — F1114 Opioid abuse with opioid-induced mood disorder: Secondary | ICD-10-CM | POA: Insufficient documentation

## 2015-07-03 DIAGNOSIS — Z5900 Homelessness unspecified: Secondary | ICD-10-CM

## 2015-07-03 DIAGNOSIS — R440 Auditory hallucinations: Secondary | ICD-10-CM | POA: Diagnosis not present

## 2015-07-03 DIAGNOSIS — F14288 Cocaine dependence with other cocaine-induced disorder: Secondary | ICD-10-CM | POA: Diagnosis not present

## 2015-07-03 DIAGNOSIS — F1420 Cocaine dependence, uncomplicated: Secondary | ICD-10-CM

## 2015-07-03 DIAGNOSIS — F1994 Other psychoactive substance use, unspecified with psychoactive substance-induced mood disorder: Secondary | ICD-10-CM | POA: Diagnosis not present

## 2015-07-03 DIAGNOSIS — M1990 Unspecified osteoarthritis, unspecified site: Secondary | ICD-10-CM | POA: Insufficient documentation

## 2015-07-03 DIAGNOSIS — R45850 Homicidal ideations: Secondary | ICD-10-CM

## 2015-07-03 DIAGNOSIS — E119 Type 2 diabetes mellitus without complications: Secondary | ICD-10-CM | POA: Diagnosis not present

## 2015-07-03 DIAGNOSIS — F209 Schizophrenia, unspecified: Secondary | ICD-10-CM | POA: Diagnosis not present

## 2015-07-03 DIAGNOSIS — F10230 Alcohol dependence with withdrawal, uncomplicated: Secondary | ICD-10-CM | POA: Diagnosis not present

## 2015-07-03 DIAGNOSIS — R441 Visual hallucinations: Secondary | ICD-10-CM

## 2015-07-03 DIAGNOSIS — I10 Essential (primary) hypertension: Secondary | ICD-10-CM | POA: Diagnosis not present

## 2015-07-03 DIAGNOSIS — F1020 Alcohol dependence, uncomplicated: Secondary | ICD-10-CM | POA: Diagnosis present

## 2015-07-03 DIAGNOSIS — Z791 Long term (current) use of non-steroidal anti-inflammatories (NSAID): Secondary | ICD-10-CM | POA: Insufficient documentation

## 2015-07-03 DIAGNOSIS — Z9114 Patient's other noncompliance with medication regimen: Secondary | ICD-10-CM | POA: Insufficient documentation

## 2015-07-03 DIAGNOSIS — Z79899 Other long term (current) drug therapy: Secondary | ICD-10-CM | POA: Diagnosis not present

## 2015-07-03 LAB — COMPREHENSIVE METABOLIC PANEL
ALT: 55 U/L (ref 17–63)
ANION GAP: 10 (ref 5–15)
AST: 89 U/L — ABNORMAL HIGH (ref 15–41)
Albumin: 4.5 g/dL (ref 3.5–5.0)
Alkaline Phosphatase: 55 U/L (ref 38–126)
BUN: 11 mg/dL (ref 6–20)
CHLORIDE: 109 mmol/L (ref 101–111)
CO2: 22 mmol/L (ref 22–32)
CREATININE: 0.76 mg/dL (ref 0.61–1.24)
Calcium: 9.3 mg/dL (ref 8.9–10.3)
GFR calc non Af Amer: >60 mL/min (ref >60)
Glucose, Bld: 189 mg/dL — ABNORMAL HIGH (ref 65–99)
Potassium: 4 mmol/L (ref 3.5–5.1)
Sodium: 141 mmol/L (ref 135–145)
Total Bilirubin: 0.7 mg/dL (ref 0.3–1.2)
Total Protein: 8.5 g/dL — ABNORMAL HIGH (ref 6.5–8.1)

## 2015-07-03 LAB — CBC
HCT: 38.6 % — ABNORMAL LOW (ref 39.0–52.0)
HEMOGLOBIN: 12.8 g/dL — AB (ref 13.0–17.0)
MCH: 29.3 pg (ref 26.0–34.0)
MCHC: 33.2 g/dL (ref 30.0–36.0)
MCV: 88.3 fL (ref 78.0–100.0)
PLATELETS: 166 10*3/uL (ref 150–400)
RBC: 4.37 MIL/uL (ref 4.22–5.81)
RDW: 12.2 % (ref 11.5–15.5)
WBC: 7.3 10*3/uL (ref 4.0–10.5)

## 2015-07-03 LAB — RAPID URINE DRUG SCREEN, HOSP PERFORMED
AMPHETAMINES: NOT DETECTED
Barbiturates: NOT DETECTED
Benzodiazepines: NOT DETECTED
COCAINE: POSITIVE — AB
OPIATES: POSITIVE — AB
TETRAHYDROCANNABINOL: NOT DETECTED

## 2015-07-03 LAB — SALICYLATE LEVEL

## 2015-07-03 LAB — ETHANOL: ALCOHOL ETHYL (B): 145 mg/dL — AB (ref <5)

## 2015-07-03 LAB — ACETAMINOPHEN LEVEL

## 2015-07-03 MED ORDER — NAPROXEN 500 MG PO TABS
500.0000 mg | ORAL_TABLET | Freq: Two times a day (BID) | ORAL | Status: DC
  Administered 2015-07-03 (×2): 500 mg via ORAL
  Filled 2015-07-03 (×2): qty 1

## 2015-07-03 MED ORDER — ARIPIPRAZOLE 5 MG PO TABS
5.0000 mg | ORAL_TABLET | Freq: Every evening | ORAL | Status: DC
  Filled 2015-07-03: qty 1

## 2015-07-03 MED ORDER — ATENOLOL 50 MG PO TABS
50.0000 mg | ORAL_TABLET | Freq: Every day | ORAL | Status: DC
  Filled 2015-07-03: qty 1

## 2015-07-03 MED ORDER — LISINOPRIL 20 MG PO TABS
20.0000 mg | ORAL_TABLET | Freq: Every day | ORAL | Status: DC
  Administered 2015-07-03: 20 mg via ORAL
  Filled 2015-07-03: qty 1

## 2015-07-03 MED ORDER — IBUPROFEN 200 MG PO TABS: 600.0000 mg | ORAL_TABLET | Freq: Three times a day (TID) | ORAL | Status: DC | PRN

## 2015-07-03 MED ORDER — AMLODIPINE BESYLATE 10 MG PO TABS
10.0000 mg | ORAL_TABLET | Freq: Every day | ORAL | Status: DC
  Administered 2015-07-03: 10 mg via ORAL
  Filled 2015-07-03: qty 1

## 2015-07-03 MED ORDER — ONDANSETRON HCL 4 MG PO TABS: 4.0000 mg | ORAL_TABLET | Freq: Three times a day (TID) | ORAL | Status: DC | PRN

## 2015-07-03 MED ORDER — ALUM & MAG HYDROXIDE-SIMETH 200-200-20 MG/5ML PO SUSP: 30.0000 mL | ORAL | Status: DC | PRN

## 2015-07-03 MED ORDER — ACETAMINOPHEN 325 MG PO TABS: 650.0000 mg | ORAL_TABLET | ORAL | Status: DC | PRN

## 2015-07-03 MED ORDER — ZOLPIDEM TARTRATE 10 MG PO TABS: 10.0000 mg | ORAL_TABLET | Freq: Every evening | ORAL | Status: DC | PRN

## 2015-07-03 MED ORDER — GLIMEPIRIDE 4 MG PO TABS
4.0000 mg | ORAL_TABLET | Freq: Every day | ORAL | Status: DC
  Administered 2015-07-03: 4 mg via ORAL
  Filled 2015-07-03 (×2): qty 1

## 2015-07-03 MED ORDER — THIAMINE HCL 100 MG/ML IJ SOLN: 100.0000 mg | Freq: Every day | INTRAMUSCULAR | Status: DC

## 2015-07-03 MED ORDER — LORAZEPAM 1 MG PO TABS: 0.0000 mg | ORAL_TABLET | Freq: Four times a day (QID) | ORAL | Status: DC

## 2015-07-03 MED ORDER — HYDRALAZINE HCL 25 MG PO TABS
25.0000 mg | ORAL_TABLET | Freq: Two times a day (BID) | ORAL | Status: DC
  Administered 2015-07-03: 25 mg via ORAL
  Filled 2015-07-03 (×2): qty 1

## 2015-07-03 MED ORDER — LORAZEPAM 1 MG PO TABS: 0.0000 mg | ORAL_TABLET | Freq: Two times a day (BID) | ORAL | Status: DC

## 2015-07-03 MED ORDER — SIMVASTATIN 20 MG PO TABS
20.0000 mg | ORAL_TABLET | Freq: Every day | ORAL | Status: DC
  Filled 2015-07-03: qty 1

## 2015-07-03 MED ORDER — ADULT MULTIVITAMIN W/MINERALS CH
1.0000 | ORAL_TABLET | Freq: Every day | ORAL | Status: DC
  Administered 2015-07-03: 1 via ORAL
  Filled 2015-07-03: qty 1

## 2015-07-03 MED ORDER — VITAMIN B-1 100 MG PO TABS
100.0000 mg | ORAL_TABLET | Freq: Every day | ORAL | Status: DC
  Administered 2015-07-03: 100 mg via ORAL
  Filled 2015-07-03: qty 1

## 2015-07-03 MED ORDER — METFORMIN HCL 500 MG PO TABS
1000.0000 mg | ORAL_TABLET | Freq: Two times a day (BID) | ORAL | Status: DC
  Administered 2015-07-03: 1000 mg via ORAL
  Filled 2015-07-03 (×3): qty 2

## 2015-07-03 MED ORDER — CLONIDINE HCL 0.1 MG PO TABS
0.3000 mg | ORAL_TABLET | Freq: Two times a day (BID) | ORAL | Status: DC
  Administered 2015-07-03 (×2): 0.3 mg via ORAL
  Filled 2015-07-03 (×2): qty 3

## 2015-07-03 MED ORDER — NICOTINE 21 MG/24HR TD PT24
21.0000 mg | MEDICATED_PATCH | Freq: Every day | TRANSDERMAL | Status: DC
  Administered 2015-07-03: 21 mg via TRANSDERMAL
  Filled 2015-07-03: qty 1

## 2015-07-03 MED ORDER — ATENOLOL 100 MG PO TABS
100.0000 mg | ORAL_TABLET | Freq: Every day | ORAL | Status: DC
  Administered 2015-07-03: 100 mg via ORAL
  Filled 2015-07-03 (×2): qty 1

## 2015-07-03 NOTE — Discharge Instructions (Addendum)
For your ongoing behavioral health needs, you are advised to continue treatment with Beverly Sessions, your ACT Team provider:       [name]      [address]      [place], [place] [address]      [phone]

## 2015-07-03 NOTE — ED Notes (Signed)
In pt belonging bag he has black jeans,a short sleeve collar shirt, brown boots and a pair of white socks. Pt has 2 dollars and 52 cents in his wallet.in a hazardous bag he has 2 whole cigaretts and a half.

## 2015-07-03 NOTE — ED Notes (Signed)
Pt oriented to room and unit.  Alert and oriented,  Denies HI SI and AVH.  He says he is ready to leave.  Encouraged pt. To wait to be assessed and offered him something to drink.  15 minute checks are in place.  Pt is safe on unit.
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
## 2015-07-03 NOTE — ED Notes (Signed)
Bed: [facility] Expected date:  Expected time:  Means of arrival:  Comments: 66M hallucinating

## 2015-07-03 NOTE — BH Assessment (Addendum)
[place] Assessment Progress Note  Per Corena Pilgrim, MD, this pt does not require psychiatric hospitalization at this time.  He is to be discharged from [facility] with recommendation to follow up with Beverly Sessions, his ACT Team provider.  Pt signed Consent to Release Information to [facility].  He would like to have them come to [facility] to pick him up.  At 12:30 I called [name] and spoke to [employer].  He agrees to contact the ACT Team for the pt.  [name] nurse, Nena Jordan, has been notified.  Jalene Mullet, MA Triage Specialist [phone]  Addendum:  At 15:15 I called a second time to [facility] and was routed to Erling Cruz, Therapist, sports.  She reports that pt is an active [employer] client, but that they will not dispatch a clinician to provide transportation for the pt and that pt will need to facilitate his own transportation.  Jalene Mullet, [place] Triage Specialist [phone]

## 2015-07-03 NOTE — ED Notes (Signed)
[name] belongings are going with him.
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
## 2015-07-03 NOTE — [facility] Suicide Risk Assessment (Signed)
Suicide Risk Assessment  Discharge Assessment   [facility] Discharge Suicide Risk Assessment   Demographic Factors:  Male  Total Time spent with patient: 45 minutes  Musculoskeletal: Strength & Muscle Tone: within normal limits Gait & Station: normal Patient leans: N/A  Psychiatric Specialty Exam: Physical Exam  Review of Systems  Constitutional: Negative.   HENT: Negative.   Eyes: Negative.   Respiratory: Negative.   Cardiovascular: Negative.   Gastrointestinal: Negative.   Genitourinary: Negative.   Musculoskeletal: Negative.   Skin: Negative.   Neurological: Negative.   Endo/Heme/Allergies: Negative.   Psychiatric/Behavioral: Positive for substance abuse.    Blood pressure 108/67, pulse 70, temperature 99.4 F (37.4 C), temperature source Oral, resp. rate 18, SpO2 100 %.There is no weight on file to calculate BMI.  General Appearance: Casual  Eye Contact::  Good  Speech:  Normal Rate  Volume:  Normal  Mood:  Euthymic  Affect:  Congruent  Thought Process:  Coherent  Orientation:  Full (Time, Place, and Person)  Thought Content:  WDL  Suicidal Thoughts:  No  Homicidal Thoughts:  No  Memory:  Immediate;   Good Recent;   Good Remote;   Good  Judgement:  Fair  Insight:  Good  Psychomotor Activity:  Normal  Concentration:  Good  Recall:  Good  Fund of Knowledge:Good  Language: Good  Akathisia:  No  Handed:  Right  AIMS (if indicated):     Assets:  Leisure Time Physical Health Resilience  ADL's:  Intact  Cognition: WNL  Sleep:         Has this patient used any form of tobacco in the last 30 days? (Cigarettes, Smokeless Tobacco, Cigars, and/or Pipes) Yes, A prescription for an FDA-approved tobacco cessation medication was offered at discharge and the patient refused  Mental Status Per Nursing Assessment::   On Admission:   Alcohol intoxication and cocaine abuse with hallucinations  Current Mental Status by Physician: NA  Loss Factors: NA  Historical  Factors: NA  Risk Reduction Factors:   Sense of responsibility to family and Positive therapeutic relationship  Continued Clinical Symptoms:  None  Cognitive Features That Contribute To Risk:  None    Suicide Risk:  Minimal: No identifiable suicidal ideation.  Patients presenting with no risk factors but with morbid ruminations; may be classified as minimal risk based on the severity of the depressive symptoms  Principal Problem: Substance induced mood disorder Discharge Diagnoses:  Patient Active Problem List   Diagnosis Date Noted  . Substance induced mood disorder [F19.94] 07/03/2015    Priority: High  . Alcohol dependence with withdrawal, uncomplicated [id number]    Priority: High  . Cocaine use disorder, severe, dependence [F14.20] 04/27/2015    Priority: High  . Auditory hallucinations [R44.0]   . 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 Hyperlipidemia [E78.5] 04/28/2015  . Schizoaffective disorder, depressive type [F25.1] 04/27/2015  . Alcohol withdrawal with perceptual disturbances [F10.232] 04/27/2015  . HTN (hypertension) [I10] 04/27/2015  . Gout [M10.9] 08/09/2013  . Diabetes mellitus [E11.9] 07/09/2012      Plan Of Care/Follow-up recommendations:  Activity:  as tolerated Diet:  heart healthy diet  Is patient on multiple antipsychotic therapies at discharge:  No   Has Patient had three or more failed trials of antipsychotic monotherapy by history:  No  Recommended Plan for Multiple Antipsychotic Therapies: NA    Nameer Summer, PMH-NP 07/03/2015, 2:49 PM

## 2015-07-03 NOTE — ED Notes (Signed)
[place]/o halluc[place].  Pt called EMS himself and reported that he has been auditory and visual hallucinations---- reports that he sees faces behind him, "talking and laughing".  Pt arrived to ED alert and appears ETOH intoxicated.  Pt reports he is "seeing people behind you saying, 'Kill, kill, kill'------- denies hearing to kill self or others; states, "They just say, KILL".  Hx of polysubstance abuse.

## 2015-08-26 ENCOUNTER — Emergency Department (HOSPITAL_COMMUNITY): Payer: Medicaid Other

## 2015-08-26 ENCOUNTER — Encounter (HOSPITAL_COMMUNITY): Payer: Self-pay

## 2015-08-26 ENCOUNTER — Inpatient Hospital Stay (HOSPITAL_COMMUNITY)
Admission: EM | Admit: 2015-08-26 | Discharge: 2015-08-29 | DRG: 918 | Disposition: A | Discharge status: Other Acute Inpatient Hospital | Payer: Medicaid Other | Attending: Internal Medicine | Admitting: Internal Medicine

## 2015-08-26 DIAGNOSIS — R079 Chest pain, unspecified: Secondary | ICD-10-CM | POA: Diagnosis not present

## 2015-08-26 DIAGNOSIS — D696 Thrombocytopenia, unspecified: Secondary | ICD-10-CM | POA: Diagnosis not present

## 2015-08-26 DIAGNOSIS — F1014 Alcohol abuse with alcohol-induced mood disorder: Secondary | ICD-10-CM | POA: Diagnosis not present

## 2015-08-26 DIAGNOSIS — T1491 Suicide attempt: Secondary | ICD-10-CM | POA: Diagnosis not present

## 2015-08-26 DIAGNOSIS — F1010 Alcohol abuse, uncomplicated: Secondary | ICD-10-CM | POA: Diagnosis present

## 2015-08-26 DIAGNOSIS — I10 Essential (primary) hypertension: Secondary | ICD-10-CM | POA: Diagnosis present

## 2015-08-26 DIAGNOSIS — T50902A Poisoning by unspecified drugs, medicaments and biological substances, intentional self-harm, initial encounter: Secondary | ICD-10-CM | POA: Diagnosis not present

## 2015-08-26 DIAGNOSIS — R404 Transient alteration of awareness: Secondary | ICD-10-CM | POA: Diagnosis not present

## 2015-08-26 DIAGNOSIS — F17200 Nicotine dependence, unspecified, uncomplicated: Secondary | ICD-10-CM | POA: Diagnosis present

## 2015-08-26 DIAGNOSIS — E872 Acidosis: Secondary | ICD-10-CM | POA: Diagnosis present

## 2015-08-26 DIAGNOSIS — Z86718 Personal history of other venous thrombosis and embolism: Secondary | ICD-10-CM | POA: Diagnosis not present

## 2015-08-26 DIAGNOSIS — E785 Hyperlipidemia, unspecified: Secondary | ICD-10-CM | POA: Diagnosis present

## 2015-08-26 DIAGNOSIS — F329 Major depressive disorder, single episode, unspecified: Secondary | ICD-10-CM | POA: Diagnosis present

## 2015-08-26 DIAGNOSIS — Z8673 Personal history of transient ischemic attack (TIA), and cerebral infarction without residual deficits: Secondary | ICD-10-CM | POA: Diagnosis not present

## 2015-08-26 DIAGNOSIS — F10129 Alcohol abuse with intoxication, unspecified: Secondary | ICD-10-CM | POA: Diagnosis not present

## 2015-08-26 DIAGNOSIS — F251 Schizoaffective disorder, depressive type: Secondary | ICD-10-CM | POA: Diagnosis present

## 2015-08-26 DIAGNOSIS — E1169 Type 2 diabetes mellitus with other specified complication: Secondary | ICD-10-CM | POA: Diagnosis present

## 2015-08-26 DIAGNOSIS — F1420 Cocaine dependence, uncomplicated: Secondary | ICD-10-CM | POA: Diagnosis present

## 2015-08-26 DIAGNOSIS — F3289 Other specified depressive episodes: Secondary | ICD-10-CM | POA: Diagnosis not present

## 2015-08-26 DIAGNOSIS — M1990 Unspecified osteoarthritis, unspecified site: Secondary | ICD-10-CM | POA: Diagnosis present

## 2015-08-26 DIAGNOSIS — M109 Gout, unspecified: Secondary | ICD-10-CM | POA: Diagnosis present

## 2015-08-26 DIAGNOSIS — T50902D Poisoning by unspecified drugs, medicaments and biological substances, intentional self-harm, subsequent encounter: Secondary | ICD-10-CM | POA: Diagnosis not present

## 2015-08-26 DIAGNOSIS — F17210 Nicotine dependence, cigarettes, uncomplicated: Secondary | ICD-10-CM | POA: Diagnosis present

## 2015-08-26 DIAGNOSIS — R4182 Altered mental status, unspecified: Secondary | ICD-10-CM | POA: Diagnosis present

## 2015-08-26 DIAGNOSIS — R6510 Systemic inflammatory response syndrome (SIRS) of non-infectious origin without acute organ dysfunction: Secondary | ICD-10-CM | POA: Diagnosis present

## 2015-08-26 DIAGNOSIS — R440 Auditory hallucinations: Secondary | ICD-10-CM | POA: Diagnosis not present

## 2015-08-26 DIAGNOSIS — Z79899 Other long term (current) drug therapy: Secondary | ICD-10-CM

## 2015-08-26 DIAGNOSIS — Z7984 Long term (current) use of oral hypoglycemic drugs: Secondary | ICD-10-CM

## 2015-08-26 DIAGNOSIS — E119 Type 2 diabetes mellitus without complications: Secondary | ICD-10-CM | POA: Diagnosis present

## 2015-08-26 LAB — COMPREHENSIVE METABOLIC PANEL
ALBUMIN: 3.9 g/dL (ref 3.5–5.0)
ALT: 56 U/L (ref 17–63)
ANION GAP: 16 — AB (ref 5–15)
AST: 142 U/L — ABNORMAL HIGH (ref 15–41)
Alkaline Phosphatase: 65 U/L (ref 38–126)
BUN: 12 mg/dL (ref 6–20)
CHLORIDE: 99 mmol/L — AB (ref 101–111)
CO2: 20 mmol/L — AB (ref 22–32)
Calcium: 9.2 mg/dL (ref 8.9–10.3)
Creatinine, Ser: 0.87 mg/dL (ref 0.61–1.24)
GFR calc non Af Amer: >60 mL/min (ref >60)
GLUCOSE: 192 mg/dL — AB (ref 65–99)
POTASSIUM: 3.5 mmol/L (ref 3.5–5.1)
SODIUM: 135 mmol/L (ref 135–145)
Total Bilirubin: 0.3 mg/dL (ref 0.3–1.2)
Total Protein: 8.1 g/dL (ref 6.5–8.1)

## 2015-08-26 LAB — CBC WITH DIFFERENTIAL/PLATELET
Basophils Absolute: 0 10*3/uL (ref 0.0–0.1)
Basophils Relative: 0 %
EOS ABS: 0.1 10*3/uL (ref 0.0–0.7)
EOS PCT: 1 %
HCT: 43.8 % (ref 39.0–52.0)
Hemoglobin: 14.5 g/dL (ref 13.0–17.0)
LYMPHS ABS: 2.5 10*3/uL (ref 0.7–4.0)
Lymphocytes Relative: 41 %
MCH: 29.8 pg (ref 26.0–34.0)
MCHC: 33.1 g/dL (ref 30.0–36.0)
MCV: 89.9 fL (ref 78.0–100.0)
Monocytes Absolute: 0.6 10*3/uL (ref 0.1–1.0)
Monocytes Relative: 11 %
Neutro Abs: 2.8 10*3/uL (ref 1.7–7.7)
Neutrophils Relative %: 47 %
PLATELETS: 122 10*3/uL — AB (ref 150–400)
RBC: 4.87 MIL/uL (ref 4.22–5.81)
RDW: 13.3 % (ref 11.5–15.5)
WBC: 6 10*3/uL (ref 4.0–10.5)

## 2015-08-26 LAB — I-STAT VENOUS BLOOD GAS, ED
Acid-base deficit: 4 mmol/L — ABNORMAL HIGH (ref 0.0–2.0)
BICARBONATE: 21.4 meq/L (ref 20.0–24.0)
O2 Saturation: 94 %
PH VEN: 7.346 — AB (ref 7.250–7.300)
TCO2: 23 mmol/L (ref 0–100)
pCO2, Ven: 39 mmHg — ABNORMAL LOW (ref 45.0–50.0)
pO2, Ven: 75 mmHg — ABNORMAL HIGH (ref 30.0–45.0)

## 2015-08-26 LAB — LIPASE, BLOOD: LIPASE: 36 U/L (ref 22–51)

## 2015-08-26 LAB — RAPID URINE DRUG SCREEN, HOSP PERFORMED
AMPHETAMINES: NOT DETECTED
Barbiturates: NOT DETECTED
Benzodiazepines: NOT DETECTED
Cocaine: NOT DETECTED
Opiates: NOT DETECTED
TETRAHYDROCANNABINOL: NOT DETECTED

## 2015-08-26 LAB — I-STAT TROPONIN, ED: TROPONIN I, POC: 0.01 ng/mL (ref 0.00–0.08)

## 2015-08-26 LAB — I-STAT CG4 LACTIC ACID, ED: Lactic Acid, Venous: 3.54 mmol/L (ref 0.5–2.0)

## 2015-08-26 LAB — PROTIME-INR
INR: 1.11 (ref 0.00–1.49)
PROTHROMBIN TIME: 14.5 s (ref 11.6–15.2)

## 2015-08-26 LAB — ACETAMINOPHEN LEVEL

## 2015-08-26 LAB — ETHANOL: Alcohol, Ethyl (B): 154 mg/dL — ABNORMAL HIGH (ref <5)

## 2015-08-26 LAB — SALICYLATE LEVEL

## 2015-08-26 MED ORDER — ONDANSETRON HCL 4 MG/2ML IJ SOLN
4.0000 mg | Freq: Once | INTRAMUSCULAR | Status: AC
  Administered 2015-08-26: 4 mg via INTRAVENOUS
  Filled 2015-08-26: qty 2

## 2015-08-26 MED ORDER — SODIUM CHLORIDE 0.9 % IJ SOLN
3.0000 mL | Freq: Two times a day (BID) | INTRAMUSCULAR | Status: DC
  Administered 2015-08-27 – 2015-08-29 (×4): 3 mL via INTRAVENOUS

## 2015-08-26 MED ORDER — SODIUM CHLORIDE 0.9 % IV BOLUS (SEPSIS)
1000.0000 mL | Freq: Once | INTRAVENOUS | Status: AC
  Administered 2015-08-26: 1000 mL via INTRAVENOUS

## 2015-08-26 MED ORDER — NICOTINE 21 MG/24HR TD PT24
21.0000 mg | MEDICATED_PATCH | Freq: Every day | TRANSDERMAL | Status: DC
  Filled 2015-08-26 (×3): qty 1

## 2015-08-26 MED ORDER — ASPIRIN EC 325 MG PO TBEC
325.0000 mg | DELAYED_RELEASE_TABLET | Freq: Every day | ORAL | Status: DC
  Administered 2015-08-27 – 2015-08-29 (×3): 325 mg via ORAL
  Filled 2015-08-26 (×3): qty 1

## 2015-08-26 MED ORDER — SODIUM CHLORIDE 0.9 % IV BOLUS (SEPSIS)
1000.0000 mL | Freq: Once | INTRAVENOUS | Status: AC
  Administered 2015-08-27: 1000 mL via INTRAVENOUS

## 2015-08-26 MED ORDER — ACETAMINOPHEN 325 MG PO TABS
650.0000 mg | ORAL_TABLET | Freq: Four times a day (QID) | ORAL | Status: DC | PRN
Start: 2015-08-26 — End: 2015-08-29
  Administered 2015-08-28 – 2015-08-29 (×3): 650 mg via ORAL
  Filled 2015-08-26 (×3): qty 2

## 2015-08-26 MED ORDER — PANTOPRAZOLE SODIUM 40 MG IV SOLR
40.0000 mg | INTRAVENOUS | Status: DC
Start: 2015-08-27 — End: 2015-08-28
  Administered 2015-08-27 (×2): 40 mg via INTRAVENOUS
  Filled 2015-08-26 (×2): qty 40

## 2015-08-26 MED ORDER — SODIUM CHLORIDE 0.9 % IV SOLN
INTRAVENOUS | Status: DC
  Administered 2015-08-27 – 2015-08-28 (×2): via INTRAVENOUS

## 2015-08-26 MED ORDER — ACETAMINOPHEN 500 MG PO TABS
1000.0000 mg | ORAL_TABLET | Freq: Once | ORAL | Status: AC
  Administered 2015-08-26: 1000 mg via ORAL
  Filled 2015-08-26: qty 2

## 2015-08-26 MED ORDER — NITROGLYCERIN 0.4 MG SL SUBL
0.4000 mg | SUBLINGUAL_TABLET | SUBLINGUAL | Status: DC | PRN
Start: 2015-08-26 — End: 2015-08-29

## 2015-08-26 MED ORDER — HEPARIN SODIUM (PORCINE) 5000 UNIT/ML IJ SOLN
5000.0000 [IU] | Freq: Three times a day (TID) | INTRAMUSCULAR | Status: DC
  Administered 2015-08-27 (×2): 5000 [IU] via SUBCUTANEOUS
  Filled 2015-08-26 (×2): qty 1

## 2015-08-26 NOTE — ED Notes (Signed)
Placed pt in wine colored scrubs.

## 2015-08-26 NOTE — ED Provider Notes (Addendum)
CSN: [id number]     Arrival date & time 08/26/15  2024 History   First MD Initiated Contact with Patient 08/26/15 2033     Chief Complaint  Patient presents with  . Drug Overdose  . Chest Pain     (Consider location/radiation/quality/duration/timing/severity/associated sxs/prior Treatment) HPI Comments: 54 year old male with past medical history including polysubstance abuse, schizophrenia, depression, alcohol abuse, diabetes, hypertension who presents with altered mental status and intentional overdose. History limited due to the patient's altered mental status and obtained primarily from EMS. According to EMS report, the patient called EMS for an intentional overdose that occurred approximately 4 hours prior to his phone call. He told them that he took 3 weeks worth of all of his medications in one sitting. He is unable to specify which medications as his girlfriend usually gives them to him. He told EMS that he has been hearing voices telling him to hurt self and others.  He admits to alcohol use and states that his last cocaine use was 1.5 days ago. He endorses central, nonradiating chest pain.  LEVEL 5 CAVEAT APPLIES DUE TO ALTERED MENTATION  Patient is a 54 y.o. male presenting with Overdose and chest pain. The history is provided by the patient and the EMS personnel.  Drug Overdose Associated symptoms include chest pain.  Chest Pain   Past Medical History  Diagnosis Date  . Drug abuse   . Alcohol abuse   . Diabetes mellitus   . Uncontrolled hypertension   . Arthritis   . Gout   . DTs (delirium tremens) ([place])     history of   . Crack cocaine use   . Noncompliance with medication regimen   . Depression   . Schizophrenia ([place])   . Stroke [facility])    Past Surgical History  Procedure Laterality Date  . Mandible reconstruction     Family History  Problem Relation Age of Onset  . Hypertension     Social History  Substance Use Topics  . Smoking status: Current Every Day  Smoker -- 0.25 packs/day    Types: Cigarettes  . Smokeless tobacco: Never Used  . Alcohol Use: Yes     Comment: [facility]    Review of Systems  Unable to perform ROS: Mental status change  Cardiovascular: Positive for chest pain.      Allergies  Review of patient's allergies indicates no known allergies.  Home Medications   Prior to Admission medications   Medication Sig Start Date End Date Taking? Authorizing Provider  amLODipine (NORVASC) 10 MG tablet Take 1 tablet (10 mg total) by mouth daily. 05/02/15   Niel Hummer, NP  ARIPiprazole (ABILIFY) 5 MG tablet Take 1 tablet (5 mg total) by mouth every evening. 05/02/15   Niel Hummer, NP  atenolol (TENORMIN) 50 MG tablet Take 100 mg (2 tablets) by mouth in the morning, and 50 mg (1 tablet) by mouth in the evening. 06/04/15   Sherwood Gambler, MD  cloNIDine (CATAPRES) 0.3 MG tablet Take 1 tablet (0.3 mg total) by mouth 2 (two) times daily. 05/02/15   Niel Hummer, NP  glimepiride (AMARYL) 4 MG tablet Take 1 tablet (4 mg total) by mouth daily with breakfast. 05/03/15   Niel Hummer, NP  hydrALAZINE (APRESOLINE) 25 MG tablet Take 1 tablet (25 mg total) by mouth every 8 (eight) hours. Patient taking differently: Take 25 mg by mouth 2 (two) times daily.  05/02/15   Niel Hummer, NP  HYDROcodone-acetaminophen ([place]) 7.5-325 MG  per tablet Take 1 tablet by mouth every 6 (six) hours as needed for moderate pain. 06/29/15   Hope Bunnie Pion, NP  lisinopril (PRINIVIL,ZESTRIL) 20 MG tablet Take 1 tablet (20 mg total) by mouth daily. 05/03/15   Niel Hummer, NP  metFORMIN (GLUCOPHAGE) 1000 MG tablet Take 1 tablet (1,000 mg total) by mouth 2 (two) times daily with a meal. 05/02/15   Niel Hummer, NP  Multiple Vitamin (MULTIVITAMIN WITH MINERALS) TABS tablet Take 1 tablet by mouth daily. 05/02/15   Niel Hummer, NP  naproxen (NAPROSYN) 500 MG tablet Take 1 tablet (500 mg total) by mouth 2 (two) times daily. 06/29/15   Hope Bunnie Pion, NP  Paliperidone  Palmitate (INVEGA SUSTENNA) 117 MG/0.75ML SUSP Inject 117 mg into the muscle every 28 (twenty-eight) days. 05/17/15   Niel Hummer, NP  simvastatin (ZOCOR) 20 MG tablet Take 1 tablet (20 mg total) by mouth daily at 6 PM. 05/02/15   Niel Hummer, NP   BP 149/100 mmHg  Pulse 80  Temp(Src) 98.5 F (36.9 C) (Oral)  Resp 13  SpO2 99% Physical Exam  Constitutional: He appears well-developed and well-nourished. No distress.  HENT:  Head: Normocephalic and atraumatic.  dry mucous membranes  Eyes: Conjunctivae are normal. Pupils are equal, round, and reactive to light.  Dilated pupils  Neck: Neck supple.  Cardiovascular: Normal rate, regular rhythm and normal heart sounds.   No murmur heard. Pulmonary/Chest: Effort normal and breath sounds normal.  Abdominal: Soft. Bowel sounds are normal. He exhibits no distension. There is no tenderness.  Musculoskeletal: He exhibits no edema.  Neurological: He is alert.  Awake but sleepy, slurred speech, moving all 4 extremities  Skin: Skin is warm and dry. No rash noted.  Nursing note and vitals reviewed.   ED Course  .Critical Care Performed by: Sharlett Iles Authorized by: Sharlett Iles Total critical care time: 60 minutes Critical care time was exclusive of separately billable procedures and treating other patients. Critical care was necessary to treat or prevent imminent or life-threatening deterioration of the following conditions: cardiac failure, CNS failure or compromise, dehydration and toxidrome. Critical care was time spent personally by me on the following activities: discussions with consultants, evaluation of patient's response to treatment, examination of patient, ordering and performing treatments and interventions, ordering and review of laboratory studies, ordering and review of radiographic studies, pulse oximetry and re-evaluation of patient's condition.   (including critical care time) Labs Review Labs Reviewed   ACETAMINOPHEN LEVEL - Abnormal; Notable for the following:    Acetaminophen (Tylenol), Serum <10 (*)    All other components within normal limits  COMPREHENSIVE METABOLIC PANEL - Abnormal; Notable for the following:    Chloride 99 (*)    CO2 20 (*)    Glucose, Bld 192 (*)    AST 142 (*)    Anion gap 16 (*)    All other components within normal limits  ETHANOL - Abnormal; Notable for the following:    Alcohol, Ethyl (B) 154 (*)    All other components within normal limits  CBC WITH DIFFERENTIAL/PLATELET - Abnormal; Notable for the following:    Platelets 122 (*)    All other components within normal limits  I-STAT CG4 LACTIC ACID, ED - Abnormal; Notable for the following:    Lactic Acid, Venous 3.54 (*)    All other components within normal limits  I-STAT VENOUS BLOOD GAS, ED - Abnormal; Notable for the following:    pH,  Ven 7.346 (*)    pCO2, Ven 39.0 (*)    pO2, Ven 75.0 (*)    Acid-base deficit 4.0 (*)    All other components within normal limits  LIPASE, BLOOD  PROTIME-INR  URINE RAPID DRUG SCREEN, HOSP PERFORMED  SALICYLATE LEVEL  BLOOD GAS, VENOUS  I-STAT TROPOININ, ED  I-STAT CG4 LACTIC ACID, ED    Imaging Review Ct Head Wo Contrast  08/26/2015   CLINICAL DATA:  Possible suicide attempt, drug overdose. History of diabetes, hypertension, polysubstance abuse, stroke.  EXAM: CT HEAD WITHOUT CONTRAST  TECHNIQUE: Contiguous axial images were obtained from the base of the skull through the vertex without intravenous contrast.  COMPARISON:  CT head May 29, 2015  FINDINGS: The ventricles and sulci are normal. Mild disproportionate cerebellar volume loss. Dystrophic calcification within medial LEFT cerebellum are unchanged, benign in appearance. Mild patchy white matter hypodensities are advanced for age. No intraparenchymal hemorrhage, mass effect nor midline shift. No acute large vascular territory infarcts.  No abnormal extra-axial fluid collections. Basal cisterns are patent.  Mild calcific atherosclerosis the carotid siphons.  No skull fracture. The included ocular globes and orbital contents are non-suspicious. Trace paranasal sinus mucosal thickening without air-fluid levels. The mastoid air cells are well aerated.  IMPRESSION: No acute intracranial process.  Mild cerebellar volume loss for age. Mild chronic small vessel ischemic disease, advanced for age.   Electronically Signed   By: Elon Alas M.D.   On: 08/26/2015 22:04   I have personally reviewed and evaluated these lab results as part of my medical decision-making.   EKG Interpretation   Date/Time:  Saturday August 26 2015 20:30:53 EDT Ventricular Rate:  95 PR Interval:  194 QRS Duration: 100 QT Interval:  354 QTC Calculation: 445 R Axis:   -27 Text Interpretation:  Sinus rhythm Left atrial enlargement Abnormal R-wave  progression, late transition Left ventricular hypertrophy ST elevation  suggests acute pericarditis ST elevation in precordial leads without  reciprocal changes to suggest acute MI Confirmed by Destyn Schuyler MD, Matis Monnier  [id number]) on 08/26/2015 8:38:22 PM     Medications  sodium chloride 0.9 % bolus 1,000 mL (0 mLs Intravenous Stopped 08/26/15 2255)  ondansetron (ZOFRAN) injection 4 mg (4 mg Intravenous Given 08/26/15 2133)  sodium chloride 0.9 % bolus 1,000 mL (1,000 mLs Intravenous New Bag/Given 08/26/15 2255)  acetaminophen (TYLENOL) tablet 1,000 mg (1,000 mg Oral Given 08/26/15 2259)    MDM   Final diagnoses:  Altered mental status, unspecified altered mental status type  Polysubstance overdose, intentional self-harm, initial encounter [facility])   54 year old male with history of polysubstance abuse and schizophrenia who presents with chest pain and stating that he intentionally overdosed on all of his medications with 3 weeks' worth of pills. EKG was obtained during transport and EMS called a STEMI alert due to concerns for ST elevation. On arrival, the patient was sleepy but awake, with  slurred speech. Moving all 4 extremities. Initial vital signs notable for hypertension at 176/117. Initial EKG showed borderline ST elevation in V2 and V3 with no reciprocal changes. STEMI alert was canceled. Given concern for overdose of multiple medications, obtained above labs including Tylenol, salicylate, alcohol levels; lactate; troponin. I spoke with poison control after the patient arrived. They recommended close monitoring for any evidence of cardiovascular instability, CNS depression, or hypoglycemia. They recommended glucagon +/- calcium for bradycardia and hypotension. However, the patient has never been hypotensive or bradycardic here and he has stated that ingestion was 4 hours prior to arrival.  Based on his multiple medications, I am skeptical that he actually took the quantity that he is endorsing. Sitter placed at bedside on patient arrival.  Initial troponin negative. VBG shows pH 7.35, CO2 39. BAL 154. Glucose 192. Lactate elevated at 3.54. Obtained head CT because of altered mental status which showed no acute findings. Gave pt 2L IVF; repeat lactate is pending. On reexamination, the patient is awake and continues to have stable vital signs with mild hypertension. I have spoken with the hospitalist and the patient will be admitted overnight for observation on cardiac monitoring before psychiatric evaluation.    Sharlett Iles, MD 08/26/15 [place], MD 08/26/15 [phone]

## 2015-08-26 NOTE — ED Notes (Signed)
[name] @ poison control called for update,she wanted all lab results.

## 2015-08-26 NOTE — H&P (Addendum)
[facility] History and Physical  [name] [id number] DOB: [birth year] DOA: 08/26/2015  Referring physician: ED physician PCP: [name]  Specialists:   Chief Complaint: overdose, auditory hallucination, AMS and chest pain  HPI: [name] is a 54 y.o. male with PMH of hypertension, hyperlipidemia, diabetes mellitus, gout, depression, drug abuse, tobacco abuse, alcohol abuse, DVT, possible stroke, schizophrenia, who presents with overdose, auditory hallucination and chest pain.  Patient has AMS, is unable to provide accurate medical history, therefore, most of the history is obtained by discussing the case with ED physician, per EMS report, and partially from pt himself. According to EMS report, the patient called EMS for an intentional overdose that occurred approximately 4 hours prior to his phone call. He told them that he took 3 weeks worth of all of his medications in one sitting. He told me that he took those medications at 9 to 10:00 aM. He is unable to specify which medications as his girlfriend usually gives them to him. He told EMS that he has been hearing voices telling him to hurt self and others. He admits to alcohol use and states that his last cocaine use was 1.5 days ago. He reports that he has chest pain over left chest. It is constant, 8 out of 10 in severity, nonradiating. He does not have pain over calf areas. He has no cough, shortness of breath, fever or chills. Patient moves all his extremities. Has mild headache. He denies abdominal pain, diarrhea, nausea, vomiting. Denies homicidal ideations.  In ED, patient was found to have negative CT head, elevated lactate is 3.54, AG=16, CBC with level less than 4, Tylenol level less than 10, alcohol level 154, INR 1.11, negative UDS, lipase is 36, WBC 6.0, temperature normal, no tachycardia, electrolytes okay. Patient submitted to inpatient for further eval and treatment  Where does patient  live?   At home   Can patient participate in ADLs?  Some   Review of Systems: could not be obtained accurately due to altered mental status.  Allergy: No Known Allergies  Past Medical History  Diagnosis Date  . Drug abuse   . Alcohol abuse   . Diabetes mellitus   . Uncontrolled hypertension   . Arthritis   . Gout   . DTs (delirium tremens) ([place])     history of   . Crack cocaine use   . Noncompliance with medication regimen   . Depression   . Schizophrenia ([place])   . Stroke [facility])     Past Surgical History  Procedure Laterality Date  . Mandible reconstruction      Social History:  reports that he has been smoking Cigarettes.  He has been smoking about 0.25 packs per day. He has never used smokeless tobacco. He reports that he drinks about 0.6 - 1.2 oz of alcohol per week. He reports that he uses illicit drugs ("Crack" cocaine and Cocaine).  Family History:  Family History  Problem Relation Age of Onset  . Hypertension       Prior to Admission medications   Medication Sig Start Date End Date Taking? Authorizing Provider  amLODipine (NORVASC) 10 MG tablet Take 1 tablet (10 mg total) by mouth daily. 05/02/15   Niel Hummer, NP  ARIPiprazole (ABILIFY) 5 MG tablet Take 1 tablet (5 mg total) by mouth every evening. 05/02/15   Niel Hummer, NP  atenolol (TENORMIN) 50 MG tablet Take 100 mg (2 tablets) by mouth in the morning, and 50 mg (1  tablet) by mouth in the evening. 06/04/15   Sherwood Gambler, MD  cloNIDine (CATAPRES) 0.3 MG tablet Take 1 tablet (0.3 mg total) by mouth 2 (two) times daily. 05/02/15   Niel Hummer, NP  glimepiride (AMARYL) 4 MG tablet Take 1 tablet (4 mg total) by mouth daily with breakfast. 05/03/15   Niel Hummer, NP  hydrALAZINE (APRESOLINE) 25 MG tablet Take 1 tablet (25 mg total) by mouth every 8 (eight) hours. Patient taking differently: Take 25 mg by mouth 2 (two) times daily.  05/02/15   Niel Hummer, NP  HYDROcodone-acetaminophen (NORCO) 7.5-325 MG per  tablet Take 1 tablet by mouth every 6 (six) hours as needed for moderate pain. 06/29/15   Hope Bunnie Pion, NP  lisinopril (PRINIVIL,ZESTRIL) 20 MG tablet Take 1 tablet (20 mg total) by mouth daily. 05/03/15   Niel Hummer, NP  metFORMIN (GLUCOPHAGE) 1000 MG tablet Take 1 tablet (1,000 mg total) by mouth 2 (two) times daily with a meal. 05/02/15   Niel Hummer, NP  Multiple Vitamin (MULTIVITAMIN WITH MINERALS) TABS tablet Take 1 tablet by mouth daily. 05/02/15   Niel Hummer, NP  naproxen (NAPROSYN) 500 MG tablet Take 1 tablet (500 mg total) by mouth 2 (two) times daily. 06/29/15   Hope Bunnie Pion, NP  Paliperidone Palmitate (INVEGA SUSTENNA) 117 MG/0.75ML SUSP Inject 117 mg into the muscle every 28 (twenty-eight) days. 05/17/15   Niel Hummer, NP  simvastatin (ZOCOR) 20 MG tablet Take 1 tablet (20 mg total) by mouth daily at 6 PM. 05/02/15   Niel Hummer, NP    Physical Exam: Filed Vitals:   08/27/15 0040 08/27/15 0106 08/27/15 0125 08/27/15 0134  BP: 139/92 139/92  156/102  Pulse: 93 86  87  Temp:    97.7 F (36.5 C)  TempSrc:    Oral  Resp: 13     Height: 5\' 11"  (1.803 m)  5\' 11"  (1.803 m)   Weight: 92.987 kg (205 lb)  96.072 kg (211 lb 12.8 oz)   SpO2: 100%   97%   General: Not in acute distress HEENT:       Eyes: PERRL, EOMI, no scleral icterus.       ENT: No discharge from the ears and nose, no pharynx injection, no tonsillar enlargement.        Neck: No JVD, no bruit, no mass felt. Heme: No neck lymph node enlargement. Cardiac: S1/S2, RRR, No murmurs, No gallops or rubs. Pulm: No rales, wheezing, rhonchi or rubs. Abd: Soft, nondistended, nontender, no rebound pain, no organomegaly, BS present. Ext: No pitting leg edema bilaterally. 2+DP/PT pulse bilaterally. Musculoskeletal: No joint deformities, No joint redness or warmth, no limitation of ROM in spin. Skin: No rashes.  Neuro: Alert, oriented to place, but not time and person, cranial nerves II-XII grossly intact, movers all  extremities. Knee reflex 1+ bilaterally. Negative Babinski's sign. Psych: Patient is not psychotic, no suicidal or hemocidal ideation.  Labs on Admission:  Basic Metabolic Panel:  Recent Labs Lab 08/26/15 2104  NA 135  K 3.5  CL 99*  CO2 20*  GLUCOSE 192*  BUN 12  CREATININE 0.87  CALCIUM 9.2   Liver Function Tests:  Recent Labs Lab 08/26/15 2104  AST 142*  ALT 56  ALKPHOS 65  BILITOT 0.3  PROT 8.1  ALBUMIN 3.9    Recent Labs Lab 08/26/15 2104  LIPASE 36   No results for input(s): AMMONIA in the last 168 hours. CBC:  Recent Labs Lab 08/26/15 2104  WBC 6.0  NEUTROABS 2.8  HGB 14.5  HCT 43.8  MCV 89.9  PLT 122*   Cardiac Enzymes:  Recent Labs Lab 08/27/15 0107  TROPONINI <0.03    BNP (last 3 results) No results for input(s): BNP in the last 8760 hours.  ProBNP (last 3 results) No results for input(s): PROBNP in the last 8760 hours.  CBG:  Recent Labs Lab 08/27/15 0132  GLUCAP 185*    Radiological Exams on Admission: Ct Head Wo Contrast  08/26/2015   CLINICAL DATA:  Possible suicide attempt, drug overdose. History of diabetes, hypertension, polysubstance abuse, stroke.  EXAM: CT HEAD WITHOUT CONTRAST  TECHNIQUE: Contiguous axial images were obtained from the base of the skull through the vertex without intravenous contrast.  COMPARISON:  CT head May 29, 2015  FINDINGS: The ventricles and sulci are normal. Mild disproportionate cerebellar volume loss. Dystrophic calcification within medial LEFT cerebellum are unchanged, benign in appearance. Mild patchy white matter hypodensities are advanced for age. No intraparenchymal hemorrhage, mass effect nor midline shift. No acute large vascular territory infarcts.  No abnormal extra-axial fluid collections. Basal cisterns are patent. Mild calcific atherosclerosis the carotid siphons.  No skull fracture. The included ocular globes and orbital contents are non-suspicious. Trace paranasal sinus mucosal  thickening without air-fluid levels. The mastoid air cells are well aerated.  IMPRESSION: No acute intracranial process.  Mild cerebellar volume loss for age. Mild chronic small vessel ischemic disease, advanced for age.   Electronically Signed   By: Elon Alas M.D.   On: 08/26/2015 22:04    EKG: Independently reviewed.  Abnormal findings: LAE, LAD, poor R-wave progression  Assessment/Plan Principal Problem:   Overdose Active Problems:   Diabetes mellitus (HCC)   Gout   Schizoaffective disorder, depressive type (HCC)   Cocaine use disorder, severe, dependence (HCC)   HTN (hypertension)   Hyperlipidemia   Auditory hallucinations   Alcohol abuse   Tobacco abuse   Chest pain   Metabolic acidosis, increased anion gap   Altered mental status  Overdose: ED physician, Dr. little spoke with poison control.They recommended close monitoring for any evidence of cardiovascular instability, CNS depression, or hypoglycemia. They recommended glucagon +/- calcium for bradycardia and hypotension. Currently patient's vital signs are stable. I doubt that he really took the quantity of his home mes that he reported.   -will when admitted to the stepdown bed -Frequent neuro check -Hold his home medications since we don't know which one he really overdosed -Telemetry monitor - consult to psych in AM -sitter at bedside -called psych, will see today.  DM-II: Last A1c 6.5 on 04/28/15, well controled. Patient is taking metformin and Amaryl at home -SSI  Schizoaffective disorder, depressive type ([place]) and depression: has auditory hallucination and suicidal action -hold paliperidone and Abilify now  Cocaine use disorder, severe, dependence ([place]): UDS negative today.  Tobacco abuse and Alcohol abuse: -Nicotine patch -CIWA protocol  HTN: -hold home meds -IV hydralazine when necessary  Metabolic acidosis, increased anion gap: AG=16, bicarbonate 20. Most likely due to elevated lactate, which is  likely due to starvation and alcohol abuse. - trend lactic acid -IVF: 3L and then 75 cc/h  Chest pain: etiology is not clear. Patient does not have shortness of breath or signs of DVT, less likely to have PE. - will check CXR - cycle CE q6 x3 and repeat her EKG in the am  - Nitroglycerin, aspirin - Consider cardiology consult if test positive for CEs  -  2d echo - Protonix for possible acid reflux  SIRS: patient admitted criteria for SIRS with elevated lactate and tachypnea. no signs of infection. Hemodynamically stable. -will get Procalcitonin and trend lactic acid levels per sepsis protocol. -IVF: 3L of NS bolus in ED, followed by 75 cc/h   DVT ppx: SQ Heparin   Code Status: Full code Family Communication: None at bed side. Disposition Plan: Admit to inpatient   Date of Service 08/27/2015    Ivor Costa [facility] Pager [phone]  If 7PM-7AM, please contact night-coverage [email or web address] Password [email or web address] 08/27/2015, 3:35 AM

## 2015-08-26 NOTE — ED Notes (Signed)
To room via ems.   Pt reports 4 hours PTA pt took 3 weeks of meds (about 300 pills.).  Has been drinking alcohol all day. Cocaine 1.5 days ago.  Mid sternal chest pain x 1.5 days.  EMS gave ASA 324 mg, NTG x 2 tabs.  Pt hearing voices telling him to hurt self and others.

## 2015-08-27 ENCOUNTER — Inpatient Hospital Stay (HOSPITAL_COMMUNITY): Payer: Medicaid Other

## 2015-08-27 ENCOUNTER — Other Ambulatory Visit (HOSPITAL_COMMUNITY): Payer: Self-pay

## 2015-08-27 ENCOUNTER — Encounter (HOSPITAL_COMMUNITY): Payer: Self-pay | Admitting: *Deleted

## 2015-08-27 DIAGNOSIS — R404 Transient alteration of awareness: Secondary | ICD-10-CM

## 2015-08-27 DIAGNOSIS — F1420 Cocaine dependence, uncomplicated: Secondary | ICD-10-CM

## 2015-08-27 DIAGNOSIS — R45851 Suicidal ideations: Secondary | ICD-10-CM

## 2015-08-27 DIAGNOSIS — E872 Acidosis: Secondary | ICD-10-CM

## 2015-08-27 DIAGNOSIS — R079 Chest pain, unspecified: Secondary | ICD-10-CM

## 2015-08-27 DIAGNOSIS — F1010 Alcohol abuse, uncomplicated: Secondary | ICD-10-CM

## 2015-08-27 DIAGNOSIS — D696 Thrombocytopenia, unspecified: Secondary | ICD-10-CM | POA: Diagnosis present

## 2015-08-27 DIAGNOSIS — E0829 Diabetes mellitus due to underlying condition with other diabetic kidney complication: Secondary | ICD-10-CM

## 2015-08-27 DIAGNOSIS — T1491 Suicide attempt: Secondary | ICD-10-CM

## 2015-08-27 DIAGNOSIS — T50902D Poisoning by unspecified drugs, medicaments and biological substances, intentional self-harm, subsequent encounter: Secondary | ICD-10-CM

## 2015-08-27 DIAGNOSIS — R440 Auditory hallucinations: Secondary | ICD-10-CM

## 2015-08-27 DIAGNOSIS — Z720 Tobacco use: Secondary | ICD-10-CM

## 2015-08-27 DIAGNOSIS — F251 Schizoaffective disorder, depressive type: Secondary | ICD-10-CM

## 2015-08-27 DIAGNOSIS — E785 Hyperlipidemia, unspecified: Secondary | ICD-10-CM

## 2015-08-27 DIAGNOSIS — I10 Essential (primary) hypertension: Secondary | ICD-10-CM

## 2015-08-27 LAB — URINALYSIS, ROUTINE W REFLEX MICROSCOPIC
BILIRUBIN URINE: NEGATIVE
Glucose, UA: NEGATIVE mg/dL
HGB URINE DIPSTICK: NEGATIVE
Ketones, ur: NEGATIVE mg/dL
Leukocytes, UA: NEGATIVE
Nitrite: NEGATIVE
Protein, ur: NEGATIVE mg/dL
SPECIFIC GRAVITY, URINE: 1.004 — AB (ref 1.005–1.030)
UROBILINOGEN UA: 0.2 mg/dL (ref 0.0–1.0)
pH: 5 (ref 5.0–8.0)

## 2015-08-27 LAB — TROPONIN I
Troponin I: <0.03 ng/mL (ref <0.031)
Troponin I: <0.03 ng/mL (ref <0.031)

## 2015-08-27 LAB — COMPREHENSIVE METABOLIC PANEL
ALT: 49 U/L (ref 17–63)
AST: 121 U/L — AB (ref 15–41)
Albumin: 3.2 g/dL — ABNORMAL LOW (ref 3.5–5.0)
Alkaline Phosphatase: 59 U/L (ref 38–126)
Anion gap: 13 (ref 5–15)
BILIRUBIN TOTAL: 0.7 mg/dL (ref 0.3–1.2)
BUN: 9 mg/dL (ref 6–20)
CALCIUM: 8.4 mg/dL — AB (ref 8.9–10.3)
CO2: 24 mmol/L (ref 22–32)
Chloride: 105 mmol/L (ref 101–111)
Creatinine, Ser: 0.79 mg/dL (ref 0.61–1.24)
GFR calc Af Amer: >60 mL/min (ref >60)
Glucose, Bld: 182 mg/dL — ABNORMAL HIGH (ref 65–99)
POTASSIUM: 3.8 mmol/L (ref 3.5–5.1)
Sodium: 142 mmol/L (ref 135–145)
TOTAL PROTEIN: 6.6 g/dL (ref 6.5–8.1)

## 2015-08-27 LAB — GLUCOSE, CAPILLARY
GLUCOSE-CAPILLARY: 165 mg/dL — AB (ref 65–99)
Glucose-Capillary: 185 mg/dL — ABNORMAL HIGH (ref 65–99)
Glucose-Capillary: 188 mg/dL — ABNORMAL HIGH (ref 65–99)
Glucose-Capillary: 181 mg/dL — ABNORMAL HIGH (ref 65–99)
Glucose-Capillary: 159 mg/dL — ABNORMAL HIGH (ref 65–99)

## 2015-08-27 LAB — CBC
HEMATOCRIT: 40.5 % (ref 39.0–52.0)
Hemoglobin: 13.4 g/dL (ref 13.0–17.0)
MCH: 29.9 pg (ref 26.0–34.0)
MCHC: 33.1 g/dL (ref 30.0–36.0)
MCV: 90.4 fL (ref 78.0–100.0)
Platelets: 113 10*3/uL — ABNORMAL LOW (ref 150–400)
RBC: 4.48 MIL/uL (ref 4.22–5.81)
RDW: 13.5 % (ref 11.5–15.5)
WBC: 4.5 10*3/uL (ref 4.0–10.5)

## 2015-08-27 LAB — LACTIC ACID, PLASMA
Lactic Acid, Venous: 3.1 mmol/L (ref 0.5–2.0)
Lactic Acid, Venous: 2.9 mmol/L (ref 0.5–2.0)

## 2015-08-27 LAB — I-STAT CG4 LACTIC ACID, ED: LACTIC ACID, VENOUS: 3.03 mmol/L — AB (ref 0.5–2.0)

## 2015-08-27 LAB — PROCALCITONIN: Procalcitonin: <0.1 ng/mL

## 2015-08-27 LAB — APTT: APTT: 35 s (ref 24–37)

## 2015-08-27 MED ORDER — LORAZEPAM 2 MG/ML IJ SOLN: 0.0000 mg | Freq: Two times a day (BID) | INTRAMUSCULAR | Status: DC

## 2015-08-27 MED ORDER — VITAMIN B-1 100 MG PO TABS
100.0000 mg | ORAL_TABLET | Freq: Every day | ORAL | Status: DC
  Administered 2015-08-27 – 2015-08-29 (×3): 100 mg via ORAL
  Filled 2015-08-27 (×3): qty 1

## 2015-08-27 MED ORDER — FOLIC ACID 1 MG PO TABS
1.0000 mg | ORAL_TABLET | Freq: Every day | ORAL | Status: DC
  Administered 2015-08-27 – 2015-08-29 (×3): 1 mg via ORAL
  Filled 2015-08-27 (×3): qty 1

## 2015-08-27 MED ORDER — THIAMINE HCL 100 MG/ML IJ SOLN: 100.0000 mg | Freq: Every day | INTRAMUSCULAR | Status: DC

## 2015-08-27 MED ORDER — LORAZEPAM 2 MG/ML IJ SOLN: 1.0000 mg | Freq: Four times a day (QID) | INTRAMUSCULAR | Status: DC | PRN

## 2015-08-27 MED ORDER — INFLUENZA VAC SPLIT QUAD 0.5 ML IM SUSY
0.5000 mL | PREFILLED_SYRINGE | INTRAMUSCULAR | Status: AC
  Administered 2015-08-28: 0.5 mL via INTRAMUSCULAR
  Filled 2015-08-27: qty 0.5

## 2015-08-27 MED ORDER — LORAZEPAM 2 MG/ML IJ SOLN
0.0000 mg | Freq: Four times a day (QID) | INTRAMUSCULAR | Status: AC
  Filled 2015-08-27: qty 1

## 2015-08-27 MED ORDER — IBUPROFEN 200 MG PO TABS
400.0000 mg | ORAL_TABLET | Freq: Once | ORAL | Status: AC
  Administered 2015-08-27: 400 mg via ORAL
  Filled 2015-08-27: qty 2

## 2015-08-27 MED ORDER — INSULIN ASPART 100 UNIT/ML ~~LOC~~ SOLN
0.0000 [IU] | Freq: Three times a day (TID) | SUBCUTANEOUS | Status: DC
Start: 2015-08-27 — End: 2015-08-29
  Administered 2015-08-27 (×3): 2 [IU] via SUBCUTANEOUS
  Administered 2015-08-28: 3 [IU] via SUBCUTANEOUS
  Administered 2015-08-28 (×2): 5 [IU] via SUBCUTANEOUS
  Administered 2015-08-29: 2 [IU] via SUBCUTANEOUS
  Administered 2015-08-29: 3 [IU] via SUBCUTANEOUS

## 2015-08-27 MED ORDER — ATENOLOL 25 MG PO TABS: 50.0000 mg | ORAL_TABLET | Freq: Two times a day (BID) | ORAL | Status: DC

## 2015-08-27 MED ORDER — ADULT MULTIVITAMIN W/MINERALS CH
1.0000 | ORAL_TABLET | Freq: Every day | ORAL | Status: DC
  Administered 2015-08-27 – 2015-08-29 (×3): 1 via ORAL
  Filled 2015-08-27 (×3): qty 1

## 2015-08-27 MED ORDER — HYDRALAZINE HCL 20 MG/ML IJ SOLN
5.0000 mg | INTRAMUSCULAR | Status: DC | PRN
  Administered 2015-08-28: 5 mg via INTRAVENOUS
  Filled 2015-08-27: qty 1

## 2015-08-27 MED ORDER — LORAZEPAM 1 MG PO TABS: 1.0000 mg | ORAL_TABLET | Freq: Four times a day (QID) | ORAL | Status: DC | PRN
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
## 2015-08-27 NOTE — Evaluation (Signed)
Physical Therapy Evaluation Patient Details Name: [name] MRN: [id number] DOB: Dec 30, 1960 Today's Date: 08/27/2015   History of Present Illness  54 year old male with past medical history including polysubstance abuse, schizophrenia, depression, alcohol abuse, diabetes, hypertension who presents with altered mental status, chest pain and intentional overdose  Clinical Impression  Patient overall did well with mobility.  Appeared a little "stiff" during gait which he reports is not his baseline.  Feel patient will return to baseline as medication levels decrease.  Do not feel further PT is indicated.  Did discuss with patient and sitter to ensure patient is ambulating several times/day.    Follow Up Recommendations No PT follow up    Equipment Recommendations  None recommended by PT    Recommendations for Other Services       Precautions / Restrictions Precautions Precautions: Fall      Mobility  Bed Mobility Overal bed mobility: Independent                Transfers Overall transfer level: Independent                  Ambulation/Gait Ambulation/Gait assistance: Supervision Ambulation Distance (Feet): 150 Feet Assistive device: None Gait Pattern/deviations: Wide base of support;Step-through pattern     General Gait Details: no loss of balance noted, appeared a little "stiff" during gait.    Stairs            Wheelchair Mobility    Modified Rankin (Stroke Patients Only)       Balance Overall balance assessment: Modified Independent Sitting-balance support: No upper extremity supported Sitting balance-Leahy Scale: Normal     Standing balance support: No upper extremity supported Standing balance-Leahy Scale: Good                 High Level Balance Comments: patient tolerated perturbations during gait, varying speeds and picking up item off floor without loss of balance.               Pertinent Vitals/Pain Pain  Assessment: No/denies pain    Home Living Family/patient expects to be discharged to:: Private residence Living Arrangements: Children   Type of Home: Apartment Home Access: Elevator     Home Layout: One level Home Equipment: None      Prior Function Level of Independence: Independent               Hand Dominance        Extremity/Trunk Assessment   Upper Extremity Assessment: Overall WFL for tasks assessed           Lower Extremity Assessment: Overall WFL for tasks assessed      Cervical / Trunk Assessment: Normal  Communication   Communication: No difficulties  Cognition Arousal/Alertness: Awake/alert Behavior During Therapy: WFL for tasks assessed/performed Overall Cognitive Status: Within Functional Limits for tasks assessed                      General Comments      Exercises        Assessment/Plan    PT Assessment Patent does not need any further PT services  PT Diagnosis Difficulty walking   PT Problem List    PT Treatment Interventions     PT Goals (Current goals can be found in the Care Plan section) Acute Rehab PT Goals Patient Stated Goal: get better PT Goal Formulation: All assessment and education complete, DC therapy    Frequency     Barriers to discharge  Co-evaluation               End of Session   Activity Tolerance: Patient tolerated treatment well Patient left: in bed;with nursing/sitter in room Nurse Communication: Mobility status         Time: [id number] PT Time Calculation (min) (ACUTE ONLY): 8 min   Charges:   PT Evaluation $Initial PT Evaluation Tier I: 1 Procedure     PT G CodesShanna Cisco 08/27/2015, 10:29 AM 08/27/2015 [place], [place]

## 2015-08-27 NOTE — Progress Notes (Signed)
CRITICAL VALUE ALERT  Critical value received:  Lactic Acid 3.1  Date of notification:  08/27/15  Time of notification:  0200  Critical value read back:Yes.    Nurse who received alert:  Yetta Glassman  MD notified (1st page):  md previously aware  Time of first page:  md previously aware  MD notified (2nd page):  Time of second page:  Responding MD:    Time MD responded:

## 2015-08-27 NOTE — ED Notes (Signed)
Report attempted x2

## 2015-08-27 NOTE — Progress Notes (Signed)
  Echocardiogram 2D Echocardiogram has been performed.  [name] 08/27/2015, 11:58 AM

## 2015-08-27 NOTE — Consult Note (Signed)
[facility] Face-to-Face Psychiatry Consult   Reason for Consult:  AMS and intentional overdose of multiple medications Referring Physician:  Dr. Cruzita Lederer Patient Identification: [name] MRN:  [id number] Principal Diagnosis: Overdose Diagnosis:   Patient Active Problem List   Diagnosis Date Noted  . Alcohol abuse [F10.10] 08/26/2015  . Tobacco abuse [Z72.0] 08/26/2015  . Overdose [T50.901A] 08/26/2015  . Chest pain [R07.9] 08/26/2015  . Metabolic acidosis, increased anion gap [E87.2] 08/26/2015  . Altered mental status [R41.82] 08/26/2015  . Pain in the chest [R07.9]   . Substance induced mood disorder ([place]) [F19.94] 07/03/2015  . Alcohol dependence with withdrawal, uncomplicated ([place]) [Q28.638] 07/03/2015  . Auditory hallucinations [R44.0]   . 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 Hyperlipidemia [E78.5] 04/28/2015  . Schizoaffective disorder, depressive type ([place]) [F25.1] 04/27/2015  . Cocaine use disorder, severe, dependence ([place]) [F14.20] 04/27/2015  . Alcohol withdrawal with perceptual disturbances ([place]) [F10.232] 04/27/2015  . HTN (hypertension) [I10] 04/27/2015  . Gout [M10.9] 08/09/2013  . Diabetes mellitus ([place]) [E11.9] 07/09/2012    Total Time spent with patient: 1 hour  Subjective:   [name] is a 54 y.o. male patient admitted with AMS and intentional overdose of multiple medications.  HPI:  [name] is a 54 y.o. disabled male seen and chart reviewed for psychiatric consultation and evaluation of intentional overdose of medication has a suicide attempt and also suffering with alcohol intoxication and hallucinations. Patient reported he has been tired of living with his girlfriend who has been suffering with chronic mental illness. Patient has been in and out of for life for the last 3 years. Reportedly he cannot tolerate her behavior for the last one year. Patient informed to the emergency medical personnel that he took approximately 3  weeks supply for his psychiatric medication to kill himself. Patient does not take his medication by himself usually his girlfriend administer for him. He admits to alcohol use and states that his last cocaine use was 1.5 days ago. Patient minimizes current suicidal/homicidal ideation and psychotic symptoms. Patient reported his activity members are very helpful and is happy with their service. Patient has no family members at bedside. Patient blood alcohol level is 154 on arrival and urine drug screen is negative for drugs of abuse.  Past Psychiatric History: Patient has been diagnosed with schizoaffective disorder, polysubstance abuse and has been receiving outpatient medication management from [facility] and also follow with the ACT team. Patient has previous acute psychiatric hospitalization in June 2016 at [facility].  Risk to Self: Is patient at risk for suicide?: Yes Risk to Others:   Prior Inpatient Therapy:   Prior Outpatient Therapy:    Past Medical History:  Past Medical History  Diagnosis Date  . Drug abuse   . Alcohol abuse   . Diabetes mellitus   . Uncontrolled hypertension   . Arthritis   . Gout   . DTs (delirium tremens) ([place])     history of   . Crack cocaine use   . Noncompliance with medication regimen   . Depression   . Schizophrenia ([place])   . Stroke [facility])     Past Surgical History  Procedure Laterality Date  . Mandible reconstruction     Family History:  Family History  Problem Relation Age of Onset  . Hypertension     Family Psychiatric  History: patient denied family history of mental illness or substance abuse. He has a daughter who is 65 years old lives in [place] and is hoping he can  go and stay with her when he does not want stay with his girlfriend. Social History:  History  Alcohol Use  . 0.6 - 1.2 oz/week  . 1-2 Cans of beer per week    Comment: [facility]     History  Drug Use  . Yes  . Special: "Crack" cocaine, Cocaine     Social History   Social History  . Marital Status: Legally Separated    Spouse Name: N/A  . Number of Children: N/A  . Years of Education: N/A   Social History Main Topics  . Smoking status: Current Every Day Smoker -- 0.25 packs/day    Types: Cigarettes  . Smokeless tobacco: Never Used  . Alcohol Use: 0.6 - 1.2 oz/week    1-2 Cans of beer per week     Comment: [facility]  . Drug Use: Yes    Special: "Crack" cocaine, Cocaine  . Sexual Activity: Not Asked   Other Topics Concern  . None   Social History Narrative   Additional Social History:                          Allergies:  No Known Allergies  Labs:  Results for orders placed or performed during the hospital encounter of 08/26/15 (from the past 48 hour(s))  I-stat troponin, ED     Status: None   Collection Time: 08/26/15  8:31 PM  Result Value Ref Range   Troponin i, poc 0.01 0.00 - 0.08 ng/mL   Comment 3            Comment: Due to the release kinetics of cTnI, a negative result within the first hours of the onset of symptoms does not rule out myocardial infarction with certainty. If myocardial infarction is still suspected, repeat the test at appropriate intervals.   I-Stat CG4 Lactic Acid, ED     Status: Abnormal   Collection Time: 08/26/15  8:53 PM  Result Value Ref Range   Lactic Acid, Venous 3.54 (HH) 0.5 - 2.0 mmol/L   Comment NOTIFIED PHYSICIAN   Acetaminophen level     Status: Abnormal   Collection Time: 08/26/15  9:04 PM  Result Value Ref Range   Acetaminophen (Tylenol), Serum <10 (L) 10 - 30 ug/mL    Comment:        THERAPEUTIC CONCENTRATIONS VARY SIGNIFICANTLY. A RANGE OF 10-30 ug/mL MAY BE AN EFFECTIVE CONCENTRATION FOR MANY PATIENTS. HOWEVER, SOME ARE BEST TREATED AT CONCENTRATIONS OUTSIDE THIS RANGE. ACETAMINOPHEN CONCENTRATIONS >150 ug/mL AT 4 HOURS AFTER INGESTION AND >50 ug/mL AT 12 HOURS AFTER INGESTION ARE OFTEN ASSOCIATED WITH TOXIC REACTIONS.   Comprehensive  metabolic panel     Status: Abnormal   Collection Time: 08/26/15  9:04 PM  Result Value Ref Range   Sodium 135 135 - 145 mmol/L   Potassium 3.5 3.5 - 5.1 mmol/L   Chloride 99 (L) 101 - 111 mmol/L   CO2 20 (L) 22 - 32 mmol/L   Glucose, Bld 192 (H) 65 - 99 mg/dL   BUN 12 6 - 20 mg/dL   Creatinine, Ser 0.87 0.61 - 1.24 mg/dL   Calcium 9.2 8.9 - 10.3 mg/dL   Total Protein 8.1 6.5 - 8.1 g/dL   Albumin 3.9 3.5 - 5.0 g/dL   AST 142 (H) 15 - 41 U/L   ALT 56 17 - 63 U/L   Alkaline Phosphatase 65 38 - 126 U/L   Total Bilirubin 0.3 0.3 - 1.2  mg/dL   GFR calc non Af Amer >60 >60 mL/min   GFR calc Af Amer >60 >60 mL/min    Comment: (NOTE) The eGFR has been calculated using the CKD EPI equation. This calculation has not been validated in all clinical situations. eGFR's persistently <60 mL/min signify possible Chronic Kidney Disease.    Anion gap 16 (H) 5 - 15  Ethanol     Status: Abnormal   Collection Time: 08/26/15  9:04 PM  Result Value Ref Range   Alcohol, Ethyl (B) 154 (H) <5 mg/dL    Comment:        LOWEST DETECTABLE LIMIT FOR SERUM ALCOHOL IS 5 mg/dL FOR MEDICAL PURPOSES ONLY   Lipase, blood     Status: None   Collection Time: 08/26/15  9:04 PM  Result Value Ref Range   Lipase 36 22 - 51 U/L  CBC with Differential     Status: Abnormal   Collection Time: 08/26/15  9:04 PM  Result Value Ref Range   WBC 6.0 4.0 - 10.5 K/uL   RBC 4.87 4.22 - 5.81 MIL/uL   Hemoglobin 14.5 13.0 - 17.0 g/dL   HCT 43.8 39.0 - 52.0 %   MCV 89.9 78.0 - 100.0 fL   MCH 29.8 26.0 - 34.0 pg   MCHC 33.1 30.0 - 36.0 g/dL   RDW 13.3 11.5 - 15.5 %   Platelets 122 (L) 150 - 400 K/uL   Neutrophils Relative % 47 %   Neutro Abs 2.8 1.7 - 7.7 K/uL   Lymphocytes Relative 41 %   Lymphs Abs 2.5 0.7 - 4.0 K/uL   Monocytes Relative 11 %   Monocytes Absolute 0.6 0.1 - 1.0 K/uL   Eosinophils Relative 1 %   Eosinophils Absolute 0.1 0.0 - 0.7 K/uL   Basophils Relative 0 %   Basophils Absolute 0.0 0.0 - 0.1 K/uL   Protime-INR     Status: None   Collection Time: 08/26/15  9:04 PM  Result Value Ref Range   Prothrombin Time 14.5 11.6 - 15.2 seconds   INR 7.86 7.67 - 2.09  Salicylate level     Status: None   Collection Time: 08/26/15  9:04 PM  Result Value Ref Range   Salicylate Lvl <4.7 2.8 - 30.0 mg/dL  Urine rapid drug screen (hosp performed)     Status: None   Collection Time: 08/26/15  9:16 PM  Result Value Ref Range   Opiates NONE DETECTED NONE DETECTED   Cocaine NONE DETECTED NONE DETECTED   Benzodiazepines NONE DETECTED NONE DETECTED   Amphetamines NONE DETECTED NONE DETECTED   Tetrahydrocannabinol NONE DETECTED NONE DETECTED   Barbiturates NONE DETECTED NONE DETECTED    Comment:        DRUG SCREEN FOR MEDICAL PURPOSES ONLY.  IF CONFIRMATION IS NEEDED FOR ANY PURPOSE, NOTIFY LAB WITHIN 5 DAYS.        LOWEST DETECTABLE LIMITS FOR URINE DRUG SCREEN Drug Class       Cutoff (ng/mL) Amphetamine      1000 Barbiturate      200 Benzodiazepine   096 Tricyclics       283 Opiates          300 Cocaine          300 THC              50   I-Stat venous blood gas, ED     Status: Abnormal   Collection Time: 08/26/15  9:20 PM  Result Value Ref  Range   pH, Ven 7.346 (H) 7.250 - 7.300   pCO2, Ven 39.0 (L) 45.0 - 50.0 mmHg   pO2, Ven 75.0 (H) 30.0 - 45.0 mmHg   Bicarbonate 21.4 20.0 - 24.0 mEq/L   TCO2 23 0 - 100 mmol/L   O2 Saturation 94.0 %   Acid-base deficit 4.0 (H) 0.0 - 2.0 mmol/L   Patient temperature 37.0 C    Sample type VENOUS   Troponin I (q 6hr x 3)     Status: None   Collection Time: 08/27/15  1:07 AM  Result Value Ref Range   Troponin I <0.03 <0.031 ng/mL    Comment:        NO INDICATION OF MYOCARDIAL INJURY.   Lactic acid, plasma     Status: Abnormal   Collection Time: 08/27/15  1:07 AM  Result Value Ref Range   Lactic Acid, Venous 3.1 (HH) 0.5 - 2.0 mmol/L    Comment: CRITICAL RESULT CALLED TO, READ BACK BY AND VERIFIED WITH: TSOUTIS,J RN 08/27/2015 0152 JORDANS    Procalcitonin     Status: None   Collection Time: 08/27/15  1:07 AM  Result Value Ref Range   Procalcitonin <0.10 ng/mL    Comment:        Interpretation: PCT (Procalcitonin) <= 0.5 ng/mL: Systemic infection (sepsis) is not likely. Local bacterial infection is possible. (NOTE)         ICU PCT Algorithm               Non ICU PCT Algorithm    ----------------------------     ------------------------------         PCT < 0.25 ng/mL                 PCT < 0.1 ng/mL     Stopping of antibiotics            Stopping of antibiotics       strongly encouraged.               strongly encouraged.    ----------------------------     ------------------------------       PCT level decrease by               PCT < 0.25 ng/mL       >= 80% from peak PCT       OR PCT 0.25 - 0.5 ng/mL          Stopping of antibiotics                                             encouraged.     Stopping of antibiotics           encouraged.    ----------------------------     ------------------------------       PCT level decrease by              PCT >= 0.25 ng/mL       < 80% from peak PCT        AND PCT >= 0.5 ng/mL            Continuin g antibiotics  encouraged.       Continuing antibiotics            encouraged.    ----------------------------     ------------------------------     PCT level increase compared          PCT > 0.5 ng/mL         with peak PCT AND          PCT >= 0.5 ng/mL             Escalation of antibiotics                                          strongly encouraged.      Escalation of antibiotics        strongly encouraged.   APTT     Status: None   Collection Time: 08/27/15  1:07 AM  Result Value Ref Range   aPTT 35 24 - 37 seconds  I-Stat CG4 Lactic Acid, ED     Status: Abnormal   Collection Time: 08/27/15  1:14 AM  Result Value Ref Range   Lactic Acid, Venous 3.03 (HH) 0.5 - 2.0 mmol/L   Comment NOTIFIED PHYSICIAN   Glucose, capillary      Status: Abnormal   Collection Time: 08/27/15  1:32 AM  Result Value Ref Range   Glucose-Capillary 185 (H) 65 - 99 mg/dL  Urinalysis, Routine w reflex microscopic (not at [facility])     Status: Abnormal   Collection Time: 08/27/15  2:10 AM  Result Value Ref Range   Color, Urine YELLOW YELLOW   APPearance CLEAR CLEAR   Specific Gravity, Urine 1.004 (L) 1.005 - 1.030   pH 5.0 5.0 - 8.0   Glucose, UA NEGATIVE NEGATIVE mg/dL   Hgb urine dipstick NEGATIVE NEGATIVE   Bilirubin Urine NEGATIVE NEGATIVE   Ketones, ur NEGATIVE NEGATIVE mg/dL   Protein, ur NEGATIVE NEGATIVE mg/dL   Urobilinogen, UA 0.2 0.0 - 1.0 mg/dL   Nitrite NEGATIVE NEGATIVE   Leukocytes, UA NEGATIVE NEGATIVE    Comment: MICROSCOPIC NOT DONE ON URINES WITH NEGATIVE PROTEIN, BLOOD, LEUKOCYTES, NITRITE, OR GLUCOSE <1000 mg/dL.  Lactic acid, plasma     Status: Abnormal   Collection Time: 08/27/15  2:31 AM  Result Value Ref Range   Lactic Acid, Venous 2.9 (HH) 0.5 - 2.0 mmol/L    Comment: CRITICAL RESULT CALLED TO, READ BACK BY AND VERIFIED WITH: TSOUTIS,J RN 08/27/2015 0317 JORDANS    Troponin I (q 6hr x 3)     Status: None   Collection Time: 08/27/15  6:15 AM  Result Value Ref Range   Troponin I <0.03 <0.031 ng/mL    Comment:        NO INDICATION OF MYOCARDIAL INJURY.   CBC     Status: Abnormal   Collection Time: 08/27/15  6:15 AM  Result Value Ref Range   WBC 4.5 4.0 - 10.5 K/uL   RBC 4.48 4.22 - 5.81 MIL/uL   Hemoglobin 13.4 13.0 - 17.0 g/dL   HCT 40.5 39.0 - 52.0 %   MCV 90.4 78.0 - 100.0 fL   MCH 29.9 26.0 - 34.0 pg   MCHC 33.1 30.0 - 36.0 g/dL   RDW 13.5 11.5 - 15.5 %   Platelets 113 (L) 150 - 400 K/uL    Comment: CONSISTENT WITH PREVIOUS RESULT  Comprehensive metabolic panel     Status:  Abnormal   Collection Time: 08/27/15  6:15 AM  Result Value Ref Range   Sodium 142 135 - 145 mmol/L    Comment: DELTA CHECK NOTED   Potassium 3.8 3.5 - 5.1 mmol/L   Chloride 105 101 - 111 mmol/L   CO2 24 22 - 32 mmol/L    Glucose, Bld 182 (H) 65 - 99 mg/dL   BUN 9 6 - 20 mg/dL   Creatinine, Ser 0.79 0.61 - 1.24 mg/dL   Calcium 8.4 (L) 8.9 - 10.3 mg/dL   Total Protein 6.6 6.5 - 8.1 g/dL   Albumin 3.2 (L) 3.5 - 5.0 g/dL   AST 121 (H) 15 - 41 U/L   ALT 49 17 - 63 U/L   Alkaline Phosphatase 59 38 - 126 U/L   Total Bilirubin 0.7 0.3 - 1.2 mg/dL   GFR calc non Af Amer >60 >60 mL/min   GFR calc Af Amer >60 >60 mL/min    Comment: (NOTE) The eGFR has been calculated using the CKD EPI equation. This calculation has not been validated in all clinical situations. eGFR's persistently <60 mL/min signify possible Chronic Kidney Disease.    Anion gap 13 5 - 15  Glucose, capillary     Status: Abnormal   Collection Time: 08/27/15  7:32 AM  Result Value Ref Range   Glucose-Capillary 165 (H) 65 - 99 mg/dL   Comment 1 Document in Chart     Current Facility-Administered Medications  Medication Dose Route Frequency Provider Last Rate Last Dose  . 0.9 %  sodium chloride infusion   Intravenous Continuous Ivor Costa, MD 75 mL/hr at 08/27/15 0900    . acetaminophen (TYLENOL) tablet 650 mg  650 mg Oral Q6H PRN Ivor Costa, MD      . aspirin EC tablet 325 mg  325 mg Oral Daily Ivor Costa, MD   325 mg at 08/27/15 0046  . folic acid (FOLVITE) tablet 1 mg  1 mg Oral Daily Ivor Costa, MD      . heparin injection 5,000 Units  5,000 Units Subcutaneous 3 times per day Ivor Costa, MD   5,000 Units at 08/27/15 0607  . hydrALAZINE (APRESOLINE) injection 5 mg  5 mg Intravenous Q2H PRN Ivor Costa, MD      . Derrill Memo ON 08/28/2015] Influenza vac split quadrivalent PF (FLUARIX) injection 0.5 mL  0.5 mL Intramuscular Tomorrow-1000 Ivor Costa, MD      . insulin aspart (novoLOG) injection 0-9 Units  0-9 Units Subcutaneous TID WC Ivor Costa, MD   2 Units at 08/27/15 [phone]  . LORazepam (ATIVAN) injection 0-4 mg  0-4 mg Intravenous Q6H Ivor Costa, MD   0 mg at 08/27/15 0444   Followed by  . [START ON 08/29/2015] LORazepam (ATIVAN) injection 0-4 mg  0-4 mg  Intravenous Q12H Ivor Costa, MD      . LORazepam (ATIVAN) tablet 1 mg  1 mg Oral Q6H PRN Ivor Costa, MD       Or  . LORazepam (ATIVAN) injection 1 mg  1 mg Intravenous Q6H PRN Ivor Costa, MD      . multivitamin with minerals tablet 1 tablet  1 tablet Oral Daily Ivor Costa, MD      . nicotine (NICODERM CQ - dosed in mg/24 hours) patch 21 mg  21 mg Transdermal Daily Ivor Costa, MD      . nitroGLYCERIN (NITROSTAT) SL tablet 0.4 mg  0.4 mg Sublingual Q5 min PRN Ivor Costa, MD      . pantoprazole (  PROTONIX) injection 40 mg  40 mg Intravenous Q24H Ivor Costa, MD   40 mg at 08/27/15 0042  . sodium chloride 0.9 % injection 3 mL  3 mL Intravenous Q12H Ivor Costa, MD   3 mL at 08/27/15 0202  . thiamine (VITAMIN B-1) tablet 100 mg  100 mg Oral Daily Ivor Costa, MD       Or  . thiamine (B-1) injection 100 mg  100 mg Intravenous Daily Ivor Costa, MD        Musculoskeletal: Strength & Muscle Tone: decreased Gait & Station: unable to stand Patient leans: N/A  Psychiatric Specialty Exam: ROS  No Fever-chills, No Headache, No changes with Vision or hearing, reports vertigo No problems swallowing food or Liquids, No Chest pain, Cough or Shortness of Breath, No Abdominal pain, No Nausea or Vommitting, Bowel movements are regular, No Blood in stool or Urine, No dysuria, No new skin rashes or bruises, No new joints pains-aches,  No new weakness, tingling, numbness in any extremity, No recent weight gain or loss, No polyuria, polydypsia or polyphagia,  A full 10 point Review of Systems was done, except as stated above, all other Review of Systems were negative.  Blood pressure 169/109, pulse 90, temperature 97.4 F (36.3 C), temperature source Oral, resp. rate 17, height '5\' 11"'  (1.803 m), weight 96.072 kg (211 lb 12.8 oz), SpO2 96 %.Body mass index is 29.55 kg/(m^2).  General Appearance: Guarded  Eye Contact::  Good  Speech:  Clear and Coherent  Volume:  Decreased  Mood:  Anxious and Depressed  Affect:   Constricted and Depressed  Thought Process:  Coherent and Goal Directed  Orientation:  Full (Time, Place, and Person)  Thought Content:  WDL  Suicidal Thoughts:  Yes.  with intent/plan  Homicidal Thoughts:  No  Memory:  Immediate;   Fair Recent;   Fair  Judgement:  Impaired  Insight:  Fair  Psychomotor Activity:  Decreased  Concentration:  Good  Recall:  Good  Fund of Knowledge:Good  Language: Good  Akathisia:  Negative  Handed:  Right  AIMS (if indicated):     Assets:  Communication Skills Desire for Improvement Financial Resources/Insurance Housing Leisure Time Resilience Social Support Talents/Skills Transportation  ADL's:  Impaired  Cognition: WNL  Sleep:      Treatment Plan Summary: Daily contact with patient to assess and evaluate symptoms and progress in treatment and Medication management  Disposition: [employer] as patient has intentional/suicidal attempt Hold psychiatric medication and medically stable Psychiatric social service may contact [place] ACT team Jeneen Rinks and [place] Recommend psychiatric Inpatient admission when medically cleared. Supportive therapy provided about ongoing stressors.  Appreciate psychiatric consultation and follow up as clinically required Please contact [phone] or [phone] if needs further assistance  Maryjean Corpening,JANARDHAHA R. 08/27/2015 10:32 AM

## 2015-08-27 NOTE — Progress Notes (Signed)
Pt arrived to 3W12 via stretcher, telemetry, EMT and suicide sitter, Harmon Pier present at bedside.  Pt denies any suicidal or homicidal ideations/ auditory hallucinations at present.  Pt c/o headache 9/10 and requesting tylenol for pain.  Noted pt had tylenol in ED PTA.  [facility] NP paged for further orders.  Will continue to monitor closely.

## 2015-08-27 NOTE — ED Notes (Signed)
Report attempted 

## 2015-08-27 NOTE — Progress Notes (Signed)
Utilization Review Completed.[name] T10/07/2015  

## 2015-08-28 DIAGNOSIS — T50902A Poisoning by unspecified drugs, medicaments and biological substances, intentional self-harm, initial encounter: Principal | ICD-10-CM

## 2015-08-28 DIAGNOSIS — D696 Thrombocytopenia, unspecified: Secondary | ICD-10-CM

## 2015-08-28 LAB — GLUCOSE, CAPILLARY
GLUCOSE-CAPILLARY: 254 mg/dL — AB (ref 65–99)
Glucose-Capillary: 263 mg/dL — ABNORMAL HIGH (ref 65–99)
Glucose-Capillary: 262 mg/dL — ABNORMAL HIGH (ref 65–99)
Glucose-Capillary: 215 mg/dL — ABNORMAL HIGH (ref 65–99)

## 2015-08-28 MED ORDER — AMLODIPINE BESYLATE 10 MG PO TABS
10.0000 mg | ORAL_TABLET | Freq: Every day | ORAL | Status: DC
  Administered 2015-08-28 – 2015-08-29 (×2): 10 mg via ORAL
  Filled 2015-08-28 (×2): qty 1

## 2015-08-28 MED ORDER — INSULIN ASPART 100 UNIT/ML ~~LOC~~ SOLN
3.0000 [IU] | Freq: Once | SUBCUTANEOUS | Status: AC
  Administered 2015-08-28: 3 [IU] via SUBCUTANEOUS

## 2015-08-28 MED ORDER — PANTOPRAZOLE SODIUM 40 MG PO TBEC
40.0000 mg | DELAYED_RELEASE_TABLET | Freq: Every day | ORAL | Status: DC
  Administered 2015-08-28: 40 mg via ORAL
  Filled 2015-08-28: qty 1

## 2015-08-28 MED ORDER — HYDRALAZINE HCL 25 MG PO TABS: 25.0000 mg | ORAL_TABLET | Freq: Two times a day (BID) | ORAL | Status: DC

## 2015-08-28 MED ORDER — CLONIDINE HCL 0.3 MG PO TABS
0.3000 mg | ORAL_TABLET | Freq: Two times a day (BID) | ORAL | Status: DC
  Administered 2015-08-28 – 2015-08-29 (×3): 0.3 mg via ORAL
  Filled 2015-08-28 (×3): qty 1

## 2015-08-28 NOTE — Progress Notes (Signed)
   08/28/15 1400  Clinical Encounter Type  Visited With Patient  Visit Type Initial;Psychological support;Spiritual support;Social support (Suicidal Idiation)  Referral From Nurse  Consult/Referral To Chaplain  Spiritual Encounters  Spiritual Needs Prayer;Emotional  Stress Factors  Patient Stress Factors Exhausted;Lack of knowledge;Loss;Loss of control;Major life changes  [facility] responded to spiritual consult for pt suicidal ideation; pt talks in low barely audible level; pt looking for purpose and meaning; [name] discussed family and possible course of action to look to meaning; [name] worked with pt on developing coping skills and to realize the value of life; pt was positive at conclusion of visit; recommend social work visit. [name] 2:04 PM

## 2015-08-28 NOTE — Discharge Summary (Addendum)
Physician Discharge Summary  [name] [id number] DOB: 07/12/1961 DOA: 08/26/2015  PCP: Loco date: 08/26/2015 Discharge date: 08/29/2015  Time spent: > 30 minutes  Recommendations for Outpatient Follow-up:  1. Follow up with PCP in 1-2 months   Discharge Diagnoses:  Principal Problem:   Overdose Active Problems:   Diabetes mellitus ([place])   Gout   Schizoaffective disorder, depressive type ([place])   Cocaine use disorder, severe, dependence (H[facility])   HTN (hypertension)   Hyperlipidemia   Auditory hallucinations   Alcohol abuse   Tobacco abuse   Chest pain   Metabolic acidosis, increased anion gap   Altered mental status   Thrombocytopenia ([facility])  Discharge Condition: stable  Diet recommendation: heart healthy  Filed Weights   08/27/15 0040 08/27/15 0125 08/28/15 0500  Weight: 92.987 kg (205 lb) 96.072 kg (211 lb 12.8 oz) 95.119 kg (209 lb 11.2 oz)   History of present illness:  [name] is a 54 y.o. male with PMH of hypertension, hyperlipidemia, diabetes mellitus, gout, depression, drug abuse, tobacco abuse, alcohol abuse, DVT, possible stroke, schizophrenia, who presents with overdose, auditory hallucination and chest pain. Patient has AMS, is unable to provide accurate medical history, therefore, most of the history is obtained by discussing the case with ED physician, per EMS report, and partially from pt himself. According to EMS report, the patient called EMS for an intentional overdose that occurred approximately 4 hours prior to his phone call. He told them that he took 3 weeks worth of all of his medications in one sitting. He told me that he took those medications at 9 to 10:00 aM. He is unable to specify which medications as his girlfriend usually gives them to him. He told EMS that he has been hearing voices telling him to hurt self and others. He admits to alcohol use and states that his last cocaine use was 1.5 days  ago. He reports that he has chest pain over left chest. It is constant, 8 out of 10 in severity, nonradiating. He does not have pain over calf areas. He has no cough, shortness of breath, fever or chills. Patient moves all his extremities. Has mild headache. He denies abdominal pain, diarrhea, nausea, vomiting. Denies homicidal ideations. In ED, patient was found to have negative CT head, elevated lactate is 3.54, AG=16, CBC with level less than 4, Tylenol level less than 10, alcohol level 154, INR 1.11, negative UDS, lipase is 36, WBC 6.0, temperature normal, no tachycardia, electrolytes okay. Patient submitted to inpatient for further eval and treatment  Hospital Course:  Overdose - ED physician, Dr. little spoke with poison control.They recommended close monitoring for any evidence of cardiovascular instability, CNS depression, or hypoglycemia. They recommended glucagon +/- calcium for bradycardia and hypotension. Patient's vital signs have remained stable and he is medically ready for transfer to inpatient psychiatric facility when bed is available. DM-II - Last A1c 6.5 on 04/28/15, well controled. Patient is taking metformin and Amaryl at home, continue Schizoaffective disorder, depressive type ([place]) and depression- has auditory hallucination and suicidal action,  per psych Cocaine use disorder, severe, dependence ([place]) - UDS negative on admission Tobacco abuse and Alcohol abuse - Nicotine patch, CIWA protocol - did not trigger. Thrombocytopenia - due to ETOH, clinically without bleeding HTN - due to overdose patient's home medications were held, however needed to be restarted on 10/10 as he became more hypertensive, with improvement in his blood pressure. Based on blood pressures, will hold hydralazine for  now not to precipitate hypotension, this can be restarted later if needed.  Metabolic acidosis, increased anion gap - AG=16, bicarbonate 20. Most likely due to elevated lactate, which is likely due  to starvation and alcohol abuse. Lactic acid improved, gap now closed Chest pain - etiology is not clear, perhaps related to cocaine use, no chest pain while hospitalized, troponin negative - 2d echo with normal EF SIRS - patient admitted criteria for SIRS with elevated lactate and tachypnea. no signs of infection. Hemodynamically stable. This has resolved.  Procedures:  2D echo Study Conclusions - Left ventricle: The cavity size was normal. Wall thickness was increased increased in a pattern of mild to moderate LVH.Systolic function was normal. The estimated ejection fraction wasin the range of 55% to 60%. Wall motion was normal; there were noregional wall motion abnormalities. Left ventricular diastolicfunction parameters were normal. - Aortic valve: Valve area (VTI): 2.15 cm^2. Valve area (Vmax):2.42 cm^2. - Aorta: The proximal ascending aorta is mildly dilated at 3.7 cm. - Left atrium: The atrium was mildly dilated. - Technically adequate study.   Consultations:  Psychiatry   Discharge Exam: Filed Vitals:   08/28/15 0645 08/28/15 0842 08/28/15 1133 08/28/15 1335  BP: 178/106 181/103 147/99 137/93  Pulse:   112 75  Temp:    98.6 F (37 C)  TempSrc:    Oral  Resp:    16  Height:      Weight:      SpO2:    99%    General: NAD Cardiovascular: RRR Respiratory: CTA biL  Discharge Instructions     Medication List    STOP taking these medications        HYDROcodone-acetaminophen 7.5-325 MG tablet  Commonly known as:  NORCO      TAKE these medications        amLODipine 10 MG tablet  Commonly known as:  NORVASC  Take 1 tablet (10 mg total) by mouth daily.     ARIPiprazole 5 MG tablet  Commonly known as:  ABILIFY  Take 1 tablet (5 mg total) by mouth every evening.     atenolol 50 MG tablet  Commonly known as:  TENORMIN  Take 100 mg (2 tablets) by mouth in the morning, and 50 mg (1 tablet) by mouth in the evening.     cloNIDine 0.3 MG tablet  Commonly  known as:  CATAPRES  Take 1 tablet (0.3 mg total) by mouth 2 (two) times daily.     glimepiride 4 MG tablet  Commonly known as:  AMARYL  Take 1 tablet (4 mg total) by mouth daily with breakfast.     hydrALAZINE 25 MG tablet  Commonly known as:  APRESOLINE  Take 1 tablet (25 mg total) by mouth every 12 (twelve) hours.     INVEGA TRINZA 410 MG/1.315ML Susp  Generic drug:  Paliperidone Palmitate  Inject 410 mg into the muscle every 3 (three) months. Last injection 07/19/15 at [facility]     lisinopril 20 MG tablet  Commonly known as:  PRINIVIL,ZESTRIL  Take 1 tablet (20 mg total) by mouth daily.     metFORMIN 1000 MG tablet  Commonly known as:  GLUCOPHAGE  Take 1 tablet (1,000 mg total) by mouth 2 (two) times daily with a meal.     multivitamin with minerals Tabs tablet  Take 1 tablet by mouth daily.     naltrexone 50 MG tablet  Commonly known as:  DEPADE  Take 50 mg by mouth daily.  naproxen 500 MG tablet  Commonly known as:  NAPROSYN  Take 1 tablet (500 mg total) by mouth 2 (two) times daily.     simvastatin 20 MG tablet  Commonly known as:  ZOCOR  Take 1 tablet (20 mg total) by mouth daily at 6 PM.         The results of significant diagnostics from this hospitalization (including imaging, microbiology, ancillary and laboratory) are listed below for reference.    Significant Diagnostic Studies: Ct Head Wo Contrast  08/26/2015   CLINICAL DATA:  Possible suicide attempt, drug overdose. History of diabetes, hypertension, polysubstance abuse, stroke.  EXAM: CT HEAD WITHOUT CONTRAST  TECHNIQUE: Contiguous axial images were obtained from the base of the skull through the vertex without intravenous contrast.  COMPARISON:  CT head May 29, 2015  FINDINGS: The ventricles and sulci are normal. Mild disproportionate cerebellar volume loss. Dystrophic calcification within medial LEFT cerebellum are unchanged, benign in appearance. Mild patchy white matter hypodensities are advanced  for age. No intraparenchymal hemorrhage, mass effect nor midline shift. No acute large vascular territory infarcts.  No abnormal extra-axial fluid collections. Basal cisterns are patent. Mild calcific atherosclerosis the carotid siphons.  No skull fracture. The included ocular globes and orbital contents are non-suspicious. Trace paranasal sinus mucosal thickening without air-fluid levels. The mastoid air cells are well aerated.  IMPRESSION: No acute intracranial process.  Mild cerebellar volume loss for age. Mild chronic small vessel ischemic disease, advanced for age.   Electronically Signed   By: Elon Alas M.D.   On: 08/26/2015 22:04   Dg Chest Port 1 View  08/27/2015   CLINICAL DATA:  Chest pain. Overdose. Altered mental status. Schizophrenia. Acute overdose during the last 24 hours.  EXAM: PORTABLE CHEST 1 VIEW  COMPARISON:  06/10/2014  FINDINGS: The heart size and mediastinal contours are within normal limits. Both lungs are clear. The visualized skeletal structures are unremarkable.  IMPRESSION: No active disease.   Electronically Signed   By: Nelson Chimes M.D.   On: 08/27/2015 08:45    Microbiology: Recent Results (from the past 240 hour(s))  Culture, blood (x 2)     Status: None (Preliminary result)   Collection Time: 08/27/15 12:55 AM  Result Value Ref Range Status   Specimen Description BLOOD RIGHT HAND  Final   Special Requests BOTTLES DRAWN AEROBIC ONLY 10CC  Final   Culture NO GROWTH 1 DAY  Final   Report Status PENDING  Incomplete  Culture, blood (x 2)     Status: None (Preliminary result)   Collection Time: 08/27/15  1:05 AM  Result Value Ref Range Status   Specimen Description BLOOD LEFT ARM  Final   Special Requests BOTTLES DRAWN AEROBIC AND ANAEROBIC 5CC  Final   Culture NO GROWTH 1 DAY  Final   Report Status PENDING  Incomplete     Labs: Basic Metabolic Panel:  Recent Labs Lab 08/26/15 2104 08/27/15 0615  NA 135 142  K 3.5 3.8  CL 99* 105  CO2 20* 24    GLUCOSE 192* 182*  BUN 12 9  CREATININE 0.87 0.79  CALCIUM 9.2 8.4*   Liver Function Tests:  Recent Labs Lab 08/26/15 2104 08/27/15 0615  AST 142* 121*  ALT 56 49  ALKPHOS 65 59  BILITOT 0.3 0.7  PROT 8.1 6.6  ALBUMIN 3.9 3.2*    Recent Labs Lab 08/26/15 2104  LIPASE 36   CBC:  Recent Labs Lab 08/26/15 2104 08/27/15 0615  WBC 6.0 4.5  NEUTROABS 2.8  --   HGB 14.5 13.4  HCT 43.8 40.5  MCV 89.9 90.4  PLT 122* 113*   Cardiac Enzymes:  Recent Labs Lab 08/27/15 0107 08/27/15 0615 08/27/15 1237  TROPONINI <0.03 <0.03 <0.03   CBG:  Recent Labs Lab 08/27/15 1128 08/27/15 1641 08/27/15 2047 08/28/15 0818 08/28/15 1133  GLUCAP 188* 181* 159* 263* 262*    Signed:  Marzetta Board  [facility] 08/28/2015, 2:55 PM

## 2015-08-28 NOTE — Clinical Social Work Psych Note (Signed)
Psych CSW was contacted by MD and notified that patient has been medically cleared.    Disposition: Inpatient psychiatric hospitalization  Psych CSW was consulted to contact ACTT team members Cleotis Nipper.  Patient is being followed by [facility] for ACTT services [phone]).  Psych CSW spoke with charge RN at [facility] who states will review patient for possible admission this evening.  Psych CSW requested charge RN to follow-up with unit regarding transfer after hours.  Nonnie Done, LCSW [phone]  Psychiatric & Orthopedics (5N 1-8) Clinical Social Worker

## 2015-08-28 NOTE — Progress Notes (Signed)
OT Cancellation Note  Patient Details Name: Venson Stumbo MRN: [id number] DOB: [birth year]   Cancelled Treatment:    Reason Eval/Treat Not Completed: OT screened, no needs identified, will sign off. Spoke with PT.  Benito Mccreedy OTR/L [id number] 08/28/2015, 8:35 AM

## 2015-08-28 NOTE — Consult Note (Signed)
[facility] Face-to-Face Psychiatry Consult follow-up  Reason for Consult:  AMS and intentional overdose of multiple medications Referring Physician:  Dr. Cruzita Lederer Patient Identification: Juriel Cid MRN:  [id number] Principal Diagnosis: Overdose Diagnosis:   Patient Active Problem List   Diagnosis Date Noted  . Thrombocytopenia ([place]) [D69.6] 08/27/2015  . Alcohol abuse [F10.10] 08/26/2015  . Tobacco abuse [Z72.0] 08/26/2015  . Overdose [T50.901A] 08/26/2015  . Chest pain [R07.9] 08/26/2015  . Metabolic acidosis, increased anion gap [E87.2] 08/26/2015  . Altered mental status [R41.82] 08/26/2015  . Pain in the chest [R07.9]   . Substance induced mood disorder ([place]) [F19.94] 07/03/2015  . Alcohol dependence with withdrawal, uncomplicated ([place]) [B55.974] 07/03/2015  . Auditory hallucinations [R44.0]   . 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 Hyperlipidemia [E78.5] 04/28/2015  . Schizoaffective disorder, depressive type ([place]) [F25.1] 04/27/2015  . Cocaine use disorder, severe, dependence ([place]) [F14.20] 04/27/2015  . Alcohol withdrawal with perceptual disturbances ([place]) [F10.232] 04/27/2015  . HTN (hypertension) [I10] 04/27/2015  . Gout [M10.9] 08/09/2013  . Diabetes mellitus ([place]) [E11.9] 07/09/2012    Total Time spent with patient: 30 minutes  Subjective:   [name] is a 54 y.o. male patient admitted with AMS and intentional overdose of multiple medications.  HPI:  [name] is a 54 y.o. disabled male seen and chart reviewed for psychiatric consultation and evaluation of intentional overdose of medication has a suicide attempt and also suffering with alcohol intoxication and hallucinations. Patient reported he has been tired of living with his girlfriend who has been suffering with chronic mental illness. Patient has been in and out of for life for the last 3 years. Reportedly he cannot tolerate her behavior for the last one year. Patient informed to  the emergency medical personnel that he took approximately 3 weeks supply for his psychiatric medication to kill himself. Patient does not take his medication by himself usually his girlfriend administer for him. He admits to alcohol use and states that his last cocaine use was 1.5 days ago. Patient minimizes current suicidal/homicidal ideation and psychotic symptoms. Patient reported his activity members are very helpful and is happy with their service. Patient has no family members at bedside. Patient blood alcohol level is 154 on arrival and urine drug screen is negative for drugs of abuse.  Past Psychiatric History: Patient has been diagnosed with schizoaffective disorder, polysubstance abuse and has been receiving outpatient medication management from [facility] and also follow with the ACT team. Patient has previous acute psychiatric hospitalization in June 2016 at [facility].  Interval history: Patient has been doing fine without significant alcohol withdrawal symptoms are side effect of the medication he was overdose. Patient is awake, alert, oriented to time place person and situation. Patient is able to interact with the safety sitter without any difficulties. Patient stated that he has been waiting for his medication to be given by the staff RN and also wants to go to rehabilitation once medically cleared. Patient was previously received services at [facility] recovery center. Patient also wants to get in touch with his ACT members from the [place]. Patient denies current symptoms of suicidal/homicidal ideation or evidence of psychosis. Patient has no alcohol withdrawal symptoms.  Risk to Self: Is patient at risk for suicide?: Yes Risk to Others:   Prior Inpatient Therapy:   Prior Outpatient Therapy:    Past Medical History:  Past Medical History  Diagnosis Date  . Drug abuse   . Alcohol abuse   . Diabetes mellitus   . Uncontrolled  hypertension   . Arthritis   . Gout   . DTs  (delirium tremens) ([place])     history of   . Crack cocaine use   . Noncompliance with medication regimen   . Depression   . Schizophrenia ([place])   . Stroke [facility])     Past Surgical History  Procedure Laterality Date  . Mandible reconstruction     Family History:  Family History  Problem Relation Age of Onset  . Hypertension     Family Psychiatric  History: patient denied family history of mental illness or substance abuse. He has a daughter who is 51 years old lives in [place] and is hoping he can go and stay with her when he does not want stay with his girlfriend.  Social History:  History  Alcohol Use  . 0.6 - 1.2 oz/week  . 1-2 Cans of beer per week    Comment: [facility]     History  Drug Use  . Yes  . Special: "Crack" cocaine, Cocaine    Social History   Social History  . Marital Status: Legally Separated    Spouse Name: N/A  . Number of Children: N/A  . Years of Education: N/A   Social History Main Topics  . Smoking status: Current Every Day Smoker -- 0.25 packs/day    Types: Cigarettes  . Smokeless tobacco: Never Used  . Alcohol Use: 0.6 - 1.2 oz/week    1-2 Cans of beer per week     Comment: [facility]  . Drug Use: Yes    Special: "Crack" cocaine, Cocaine  . Sexual Activity: Not Asked   Other Topics Concern  . None   Social History Narrative   Additional Social History:                          Allergies:  No Known Allergies  Labs:  Results for orders placed or performed during the hospital encounter of 08/26/15 (from the past 48 hour(s))  I-stat troponin, ED     Status: None   Collection Time: 08/26/15  8:31 PM  Result Value Ref Range   Troponin i, poc 0.01 0.00 - 0.08 ng/mL   Comment 3            Comment: Due to the release kinetics of cTnI, a negative result within the first hours of the onset of symptoms does not rule out myocardial infarction with certainty. If myocardial infarction is still suspected, repeat the  test at appropriate intervals.   I-Stat CG4 Lactic Acid, ED     Status: Abnormal   Collection Time: 08/26/15  8:53 PM  Result Value Ref Range   Lactic Acid, Venous 3.54 (HH) 0.5 - 2.0 mmol/L   Comment NOTIFIED PHYSICIAN   Acetaminophen level     Status: Abnormal   Collection Time: 08/26/15  9:04 PM  Result Value Ref Range   Acetaminophen (Tylenol), Serum <10 (L) 10 - 30 ug/mL    Comment:        THERAPEUTIC CONCENTRATIONS VARY SIGNIFICANTLY. A RANGE OF 10-30 ug/mL MAY BE AN EFFECTIVE CONCENTRATION FOR MANY PATIENTS. HOWEVER, SOME ARE BEST TREATED AT CONCENTRATIONS OUTSIDE THIS RANGE. ACETAMINOPHEN CONCENTRATIONS >150 ug/mL AT 4 HOURS AFTER INGESTION AND >50 ug/mL AT 12 HOURS AFTER INGESTION ARE OFTEN ASSOCIATED WITH TOXIC REACTIONS.   Comprehensive metabolic panel     Status: Abnormal   Collection Time: 08/26/15  9:04 PM  Result Value Ref Range  Sodium 135 135 - 145 mmol/L   Potassium 3.5 3.5 - 5.1 mmol/L   Chloride 99 (L) 101 - 111 mmol/L   CO2 20 (L) 22 - 32 mmol/L   Glucose, Bld 192 (H) 65 - 99 mg/dL   BUN 12 6 - 20 mg/dL   Creatinine, Ser 0.87 0.61 - 1.24 mg/dL   Calcium 9.2 8.9 - 10.3 mg/dL   Total Protein 8.1 6.5 - 8.1 g/dL   Albumin 3.9 3.5 - 5.0 g/dL   AST 142 (H) 15 - 41 U/L   ALT 56 17 - 63 U/L   Alkaline Phosphatase 65 38 - 126 U/L   Total Bilirubin 0.3 0.3 - 1.2 mg/dL   GFR calc non Af Amer >60 >60 mL/min   GFR calc Af Amer >60 >60 mL/min    Comment: (NOTE) The eGFR has been calculated using the CKD EPI equation. This calculation has not been validated in all clinical situations. eGFR's persistently <60 mL/min signify possible Chronic Kidney Disease.    Anion gap 16 (H) 5 - 15  Ethanol     Status: Abnormal   Collection Time: 08/26/15  9:04 PM  Result Value Ref Range   Alcohol, Ethyl (B) 154 (H) <5 mg/dL    Comment:        LOWEST DETECTABLE LIMIT FOR SERUM ALCOHOL IS 5 mg/dL FOR MEDICAL PURPOSES ONLY   Lipase, blood     Status: None    Collection Time: 08/26/15  9:04 PM  Result Value Ref Range   Lipase 36 22 - 51 U/L  CBC with Differential     Status: Abnormal   Collection Time: 08/26/15  9:04 PM  Result Value Ref Range   WBC 6.0 4.0 - 10.5 K/uL   RBC 4.87 4.22 - 5.81 MIL/uL   Hemoglobin 14.5 13.0 - 17.0 g/dL   HCT 43.8 39.0 - 52.0 %   MCV 89.9 78.0 - 100.0 fL   MCH 29.8 26.0 - 34.0 pg   MCHC 33.1 30.0 - 36.0 g/dL   RDW 13.3 11.5 - 15.5 %   Platelets 122 (L) 150 - 400 K/uL   Neutrophils Relative % 47 %   Neutro Abs 2.8 1.7 - 7.7 K/uL   Lymphocytes Relative 41 %   Lymphs Abs 2.5 0.7 - 4.0 K/uL   Monocytes Relative 11 %   Monocytes Absolute 0.6 0.1 - 1.0 K/uL   Eosinophils Relative 1 %   Eosinophils Absolute 0.1 0.0 - 0.7 K/uL   Basophils Relative 0 %   Basophils Absolute 0.0 0.0 - 0.1 K/uL  Protime-INR     Status: None   Collection Time: 08/26/15  9:04 PM  Result Value Ref Range   Prothrombin Time 14.5 11.6 - 15.2 seconds   INR 0.97 3.53 - 2.99  Salicylate level     Status: None   Collection Time: 08/26/15  9:04 PM  Result Value Ref Range   Salicylate Lvl <2.4 2.8 - 30.0 mg/dL  Urine rapid drug screen (hosp performed)     Status: None   Collection Time: 08/26/15  9:16 PM  Result Value Ref Range   Opiates NONE DETECTED NONE DETECTED   Cocaine NONE DETECTED NONE DETECTED   Benzodiazepines NONE DETECTED NONE DETECTED   Amphetamines NONE DETECTED NONE DETECTED   Tetrahydrocannabinol NONE DETECTED NONE DETECTED   Barbiturates NONE DETECTED NONE DETECTED    Comment:        DRUG SCREEN FOR MEDICAL PURPOSES ONLY.  IF CONFIRMATION IS NEEDED FOR ANY  PURPOSE, NOTIFY LAB WITHIN 5 DAYS.        LOWEST DETECTABLE LIMITS FOR URINE DRUG SCREEN Drug Class       Cutoff (ng/mL) Amphetamine      1000 Barbiturate      200 Benzodiazepine   859 Tricyclics       292 Opiates          300 Cocaine          300 THC              50   I-Stat venous blood gas, ED     Status: Abnormal   Collection Time: 08/26/15  9:20 PM   Result Value Ref Range   pH, Ven 7.346 (H) 7.250 - 7.300   pCO2, Ven 39.0 (L) 45.0 - 50.0 mmHg   pO2, Ven 75.0 (H) 30.0 - 45.0 mmHg   Bicarbonate 21.4 20.0 - 24.0 mEq/L   TCO2 23 0 - 100 mmol/L   O2 Saturation 94.0 %   Acid-base deficit 4.0 (H) 0.0 - 2.0 mmol/L   Patient temperature 37.0 C    Sample type VENOUS   Culture, blood (x 2)     Status: None (Preliminary result)   Collection Time: 08/27/15 12:55 AM  Result Value Ref Range   Specimen Description BLOOD RIGHT HAND    Special Requests BOTTLES DRAWN AEROBIC ONLY 10CC    Culture NO GROWTH 1 DAY    Report Status PENDING   Culture, blood (x 2)     Status: None (Preliminary result)   Collection Time: 08/27/15  1:05 AM  Result Value Ref Range   Specimen Description BLOOD LEFT ARM    Special Requests BOTTLES DRAWN AEROBIC AND ANAEROBIC 5CC    Culture NO GROWTH 1 DAY    Report Status PENDING   Troponin I (q 6hr x 3)     Status: None   Collection Time: 08/27/15  1:07 AM  Result Value Ref Range   Troponin I <0.03 <0.031 ng/mL    Comment:        NO INDICATION OF MYOCARDIAL INJURY.   Lactic acid, plasma     Status: Abnormal   Collection Time: 08/27/15  1:07 AM  Result Value Ref Range   Lactic Acid, Venous 3.1 (HH) 0.5 - 2.0 mmol/L    Comment: CRITICAL RESULT CALLED TO, READ BACK BY AND VERIFIED WITH: TSOUTIS,J RN 08/27/2015 0152 JORDANS   Procalcitonin     Status: None   Collection Time: 08/27/15  1:07 AM  Result Value Ref Range   Procalcitonin <0.10 ng/mL    Comment:        Interpretation: PCT (Procalcitonin) <= 0.5 ng/mL: Systemic infection (sepsis) is not likely. Local bacterial infection is possible. (NOTE)         ICU PCT Algorithm               Non ICU PCT Algorithm    ----------------------------     ------------------------------         PCT < 0.25 ng/mL                 PCT < 0.1 ng/mL     Stopping of antibiotics            Stopping of antibiotics       strongly encouraged.               strongly encouraged.     ----------------------------     ------------------------------  PCT level decrease by               PCT < 0.25 ng/mL       >= 80% from peak PCT       OR PCT 0.25 - 0.5 ng/mL          Stopping of antibiotics                                             encouraged.     Stopping of antibiotics           encouraged.    ----------------------------     ------------------------------       PCT level decrease by              PCT >= 0.25 ng/mL       < 80% from peak PCT        AND PCT >= 0.5 ng/mL            Continuin g antibiotics                                              encouraged.       Continuing antibiotics            encouraged.    ----------------------------     ------------------------------     PCT level increase compared          PCT > 0.5 ng/mL         with peak PCT AND          PCT >= 0.5 ng/mL             Escalation of antibiotics                                          strongly encouraged.      Escalation of antibiotics        strongly encouraged.   APTT     Status: None   Collection Time: 08/27/15  1:07 AM  Result Value Ref Range   aPTT 35 24 - 37 seconds  I-Stat CG4 Lactic Acid, ED     Status: Abnormal   Collection Time: 08/27/15  1:14 AM  Result Value Ref Range   Lactic Acid, Venous 3.03 (HH) 0.5 - 2.0 mmol/L   Comment NOTIFIED PHYSICIAN   Glucose, capillary     Status: Abnormal   Collection Time: 08/27/15  1:32 AM  Result Value Ref Range   Glucose-Capillary 185 (H) 65 - 99 mg/dL  Urinalysis, Routine w reflex microscopic (not at [facility])     Status: Abnormal   Collection Time: 08/27/15  2:10 AM  Result Value Ref Range   Color, Urine YELLOW YELLOW   APPearance CLEAR CLEAR   Specific Gravity, Urine 1.004 (L) 1.005 - 1.030   pH 5.0 5.0 - 8.0   Glucose, UA NEGATIVE NEGATIVE mg/dL   Hgb urine dipstick NEGATIVE NEGATIVE   Bilirubin Urine NEGATIVE NEGATIVE   Ketones, ur NEGATIVE NEGATIVE mg/dL   Protein, ur NEGATIVE NEGATIVE mg/dL   Urobilinogen, UA 0.2 0.0 -  1.0 mg/dL   Nitrite NEGATIVE NEGATIVE   Leukocytes,  UA NEGATIVE NEGATIVE    Comment: MICROSCOPIC NOT DONE ON URINES WITH NEGATIVE PROTEIN, BLOOD, LEUKOCYTES, NITRITE, OR GLUCOSE <1000 mg/dL.  Lactic acid, plasma     Status: Abnormal   Collection Time: 08/27/15  2:31 AM  Result Value Ref Range   Lactic Acid, Venous 2.9 (HH) 0.5 - 2.0 mmol/L    Comment: CRITICAL RESULT CALLED TO, READ BACK BY AND VERIFIED WITH: TSOUTIS,J RN 08/27/2015 0317 JORDANS    Troponin I (q 6hr x 3)     Status: None   Collection Time: 08/27/15  6:15 AM  Result Value Ref Range   Troponin I <0.03 <0.031 ng/mL    Comment:        NO INDICATION OF MYOCARDIAL INJURY.   CBC     Status: Abnormal   Collection Time: 08/27/15  6:15 AM  Result Value Ref Range   WBC 4.5 4.0 - 10.5 K/uL   RBC 4.48 4.22 - 5.81 MIL/uL   Hemoglobin 13.4 13.0 - 17.0 g/dL   HCT 40.5 39.0 - 52.0 %   MCV 90.4 78.0 - 100.0 fL   MCH 29.9 26.0 - 34.0 pg   MCHC 33.1 30.0 - 36.0 g/dL   RDW 13.5 11.5 - 15.5 %   Platelets 113 (L) 150 - 400 K/uL    Comment: CONSISTENT WITH PREVIOUS RESULT  Comprehensive metabolic panel     Status: Abnormal   Collection Time: 08/27/15  6:15 AM  Result Value Ref Range   Sodium 142 135 - 145 mmol/L    Comment: DELTA CHECK NOTED   Potassium 3.8 3.5 - 5.1 mmol/L   Chloride 105 101 - 111 mmol/L   CO2 24 22 - 32 mmol/L   Glucose, Bld 182 (H) 65 - 99 mg/dL   BUN 9 6 - 20 mg/dL   Creatinine, Ser 0.79 0.61 - 1.24 mg/dL   Calcium 8.4 (L) 8.9 - 10.3 mg/dL   Total Protein 6.6 6.5 - 8.1 g/dL   Albumin 3.2 (L) 3.5 - 5.0 g/dL   AST 121 (H) 15 - 41 U/L   ALT 49 17 - 63 U/L   Alkaline Phosphatase 59 38 - 126 U/L   Total Bilirubin 0.7 0.3 - 1.2 mg/dL   GFR calc non Af Amer >60 >60 mL/min   GFR calc Af Amer >60 >60 mL/min    Comment: (NOTE) The eGFR has been calculated using the CKD EPI equation. This calculation has not been validated in all clinical situations. eGFR's persistently <60 mL/min signify possible Chronic  Kidney Disease.    Anion gap 13 5 - 15  Glucose, capillary     Status: Abnormal   Collection Time: 08/27/15  7:32 AM  Result Value Ref Range   Glucose-Capillary 165 (H) 65 - 99 mg/dL   Comment 1 Document in Chart   Glucose, capillary     Status: Abnormal   Collection Time: 08/27/15 11:28 AM  Result Value Ref Range   Glucose-Capillary 188 (H) 65 - 99 mg/dL  Troponin I (q 6hr x 3)     Status: None   Collection Time: 08/27/15 12:37 PM  Result Value Ref Range   Troponin I <0.03 <0.031 ng/mL    Comment:        NO INDICATION OF MYOCARDIAL INJURY.   Glucose, capillary     Status: Abnormal   Collection Time: 08/27/15  4:41 PM  Result Value Ref Range   Glucose-Capillary 181 (H) 65 - 99 mg/dL  Glucose, capillary     Status:  Abnormal   Collection Time: 08/27/15  8:47 PM  Result Value Ref Range   Glucose-Capillary 159 (H) 65 - 99 mg/dL  Glucose, capillary     Status: Abnormal   Collection Time: 08/28/15  8:18 AM  Result Value Ref Range   Glucose-Capillary 263 (H) 65 - 99 mg/dL   Comment 1 Notify RN   Glucose, capillary     Status: Abnormal   Collection Time: 08/28/15 11:33 AM  Result Value Ref Range   Glucose-Capillary 262 (H) 65 - 99 mg/dL    Current Facility-Administered Medications  Medication Dose Route Frequency Provider Last Rate Last Dose  . acetaminophen (TYLENOL) tablet 650 mg  650 mg Oral Q6H PRN Ivor Costa, MD   650 mg at 08/28/15 0829  . amLODipine (NORVASC) tablet 10 mg  10 mg Oral Daily Caren Griffins, MD   10 mg at 08/28/15 0842  . aspirin EC tablet 325 mg  325 mg Oral Daily Ivor Costa, MD   325 mg at 08/28/15 1105  . cloNIDine (CATAPRES) tablet 0.3 mg  0.3 mg Oral BID Caren Griffins, MD   0.3 mg at 08/28/15 0842  . folic acid (FOLVITE) tablet 1 mg  1 mg Oral Daily Ivor Costa, MD   1 mg at 08/28/15 1106  . hydrALAZINE (APRESOLINE) injection 5 mg  5 mg Intravenous Q2H PRN Ivor Costa, MD   5 mg at 08/28/15 0618  . Influenza vac split quadrivalent PF (FLUARIX)  injection 0.5 mL  0.5 mL Intramuscular Tomorrow-1000 Ivor Costa, MD      . insulin aspart (novoLOG) injection 0-9 Units  0-9 Units Subcutaneous TID WC Ivor Costa, MD   5 Units at 08/28/15 1159  . LORazepam (ATIVAN) injection 0-4 mg  0-4 mg Intravenous Q6H Ivor Costa, MD   0 mg at 08/27/15 0444   Followed by  . [START ON 08/29/2015] LORazepam (ATIVAN) injection 0-4 mg  0-4 mg Intravenous Q12H Ivor Costa, MD      . LORazepam (ATIVAN) tablet 1 mg  1 mg Oral Q6H PRN Ivor Costa, MD       Or  . LORazepam (ATIVAN) injection 1 mg  1 mg Intravenous Q6H PRN Ivor Costa, MD      . multivitamin with minerals tablet 1 tablet  1 tablet Oral Daily Ivor Costa, MD   1 tablet at 08/28/15 1106  . nicotine (NICODERM CQ - dosed in mg/24 hours) patch 21 mg  21 mg Transdermal Daily Ivor Costa, MD   21 mg at 08/27/15 1000  . nitroGLYCERIN (NITROSTAT) SL tablet 0.4 mg  0.4 mg Sublingual Q5 min PRN Ivor Costa, MD      . pantoprazole (PROTONIX) EC tablet 40 mg  40 mg Oral QHS Ivor Costa, MD      . sodium chloride 0.9 % injection 3 mL  3 mL Intravenous Q12H Ivor Costa, MD   3 mL at 08/28/15 1109  . thiamine (VITAMIN B-1) tablet 100 mg  100 mg Oral Daily Ivor Costa, MD   100 mg at 08/28/15 1105    Musculoskeletal: Strength & Muscle Tone: decreased Gait & Station: unable to stand Patient leans: N/A  Psychiatric Specialty Exam: ROS   Blood pressure 137/93, pulse 75, temperature 98.6 F (37 C), temperature source Oral, resp. rate 16, height _0  (1.803 m), weight 95.119 kg (209 lb 11.2 oz), SpO2 99 %.Body mass index is 29.26 kg/(m^2).  General Appearance: Guarded  Eye Contact::  Good  Speech:  Clear and Coherent  Volume:  Decreased  Mood:  Anxious and Depressed  Affect:  Constricted and Depressed  Thought Process:  Coherent and Goal Directed  Orientation:  Full (Time, Place, and Person)  Thought Content:  WDL  Suicidal Thoughts:  Yes.  with intent/plan  Homicidal Thoughts:  No  Memory:  Immediate;   Fair Recent;   Fair   Judgement:  Impaired  Insight:  Fair  Psychomotor Activity:  Decreased  Concentration:  Good  Recall:  Good  Fund of Knowledge:Good  Language: Good  Akathisia:  Negative  Handed:  Right  AIMS (if indicated):     Assets:  Communication Skills Desire for Improvement Financial Resources/Insurance Housing Leisure Time Resilience Social Support Talents/Skills Transportation  ADL's:  Impaired  Cognition: WNL  Sleep:      Treatment Plan Summary: Daily contact with patient to assess and evaluate symptoms and progress in treatment and Medication management  Disposition: Chief Technology Officer as patient has intentional overdose on medications/suicidal attempt Hold psychiatric medication and medically stable Psychiatric social service may contact [place] ACT team Jeneen Rinks and [place] Recommend psychiatric Inpatient admission when medically cleared. Supportive therapy provided about ongoing stressors.  Appreciate psychiatric consultation and follow up as clinically required Please contact [phone] or [phone] if needs further assistance  Jonathandavid Marlett,JANARDHAHA R. 08/28/2015 1:53 PM

## 2015-08-29 ENCOUNTER — Inpatient Hospital Stay
Admission: EM | Admit: 2015-08-29 | Discharge: 2015-09-07 | DRG: 897 | Disposition: A | Discharge status: Home / Self Care | Payer: Medicaid Other | Source: Intra-hospital | Attending: Psychiatry | Admitting: Psychiatry

## 2015-08-29 DIAGNOSIS — E1165 Type 2 diabetes mellitus with hyperglycemia: Secondary | ICD-10-CM | POA: Diagnosis present

## 2015-08-29 DIAGNOSIS — E221 Hyperprolactinemia: Secondary | ICD-10-CM | POA: Diagnosis present

## 2015-08-29 DIAGNOSIS — K219 Gastro-esophageal reflux disease without esophagitis: Secondary | ICD-10-CM | POA: Diagnosis present

## 2015-08-29 DIAGNOSIS — R419 Unspecified symptoms and signs involving cognitive functions and awareness: Secondary | ICD-10-CM

## 2015-08-29 DIAGNOSIS — F259 Schizoaffective disorder, unspecified: Secondary | ICD-10-CM | POA: Diagnosis present

## 2015-08-29 DIAGNOSIS — I10 Essential (primary) hypertension: Secondary | ICD-10-CM | POA: Diagnosis present

## 2015-08-29 DIAGNOSIS — Z915 Personal history of self-harm: Secondary | ICD-10-CM

## 2015-08-29 DIAGNOSIS — Z8673 Personal history of transient ischemic attack (TIA), and cerebral infarction without residual deficits: Secondary | ICD-10-CM | POA: Diagnosis not present

## 2015-08-29 DIAGNOSIS — F1020 Alcohol dependence, uncomplicated: Secondary | ICD-10-CM | POA: Diagnosis present

## 2015-08-29 DIAGNOSIS — G4700 Insomnia, unspecified: Secondary | ICD-10-CM | POA: Diagnosis present

## 2015-08-29 DIAGNOSIS — M1990 Unspecified osteoarthritis, unspecified site: Secondary | ICD-10-CM | POA: Diagnosis present

## 2015-08-29 DIAGNOSIS — F1024 Alcohol dependence with alcohol-induced mood disorder: Secondary | ICD-10-CM | POA: Diagnosis present

## 2015-08-29 DIAGNOSIS — E7800 Pure hypercholesterolemia, unspecified: Secondary | ICD-10-CM | POA: Diagnosis present

## 2015-08-29 DIAGNOSIS — I679 Cerebrovascular disease, unspecified: Secondary | ICD-10-CM

## 2015-08-29 DIAGNOSIS — F3289 Other specified depressive episodes: Secondary | ICD-10-CM | POA: Diagnosis not present

## 2015-08-29 DIAGNOSIS — F1014 Alcohol abuse with alcohol-induced mood disorder: Secondary | ICD-10-CM

## 2015-08-29 DIAGNOSIS — F329 Major depressive disorder, single episode, unspecified: Secondary | ICD-10-CM | POA: Diagnosis present

## 2015-08-29 DIAGNOSIS — K5900 Constipation, unspecified: Secondary | ICD-10-CM | POA: Diagnosis present

## 2015-08-29 DIAGNOSIS — Y906 Blood alcohol level of 120-199 mg/100 ml: Secondary | ICD-10-CM | POA: Diagnosis present

## 2015-08-29 DIAGNOSIS — M109 Gout, unspecified: Secondary | ICD-10-CM | POA: Diagnosis present

## 2015-08-29 DIAGNOSIS — F10939 Alcohol use, unspecified with withdrawal, unspecified: Secondary | ICD-10-CM

## 2015-08-29 DIAGNOSIS — F10239 Alcohol dependence with withdrawal, unspecified: Secondary | ICD-10-CM | POA: Diagnosis present

## 2015-08-29 DIAGNOSIS — F17200 Nicotine dependence, unspecified, uncomplicated: Secondary | ICD-10-CM | POA: Diagnosis present

## 2015-08-29 DIAGNOSIS — Z9114 Patient's other noncompliance with medication regimen: Secondary | ICD-10-CM | POA: Diagnosis not present

## 2015-08-29 DIAGNOSIS — F09 Unspecified mental disorder due to known physiological condition: Secondary | ICD-10-CM | POA: Diagnosis present

## 2015-08-29 DIAGNOSIS — E785 Hyperlipidemia, unspecified: Secondary | ICD-10-CM | POA: Diagnosis present

## 2015-08-29 DIAGNOSIS — E1169 Type 2 diabetes mellitus with other specified complication: Secondary | ICD-10-CM | POA: Diagnosis present

## 2015-08-29 DIAGNOSIS — Z9889 Other specified postprocedural states: Secondary | ICD-10-CM

## 2015-08-29 DIAGNOSIS — E119 Type 2 diabetes mellitus without complications: Secondary | ICD-10-CM

## 2015-08-29 DIAGNOSIS — F1420 Cocaine dependence, uncomplicated: Secondary | ICD-10-CM | POA: Diagnosis present

## 2015-08-29 DIAGNOSIS — F10129 Alcohol abuse with intoxication, unspecified: Secondary | ICD-10-CM | POA: Diagnosis not present

## 2015-08-29 DIAGNOSIS — Z79899 Other long term (current) drug therapy: Secondary | ICD-10-CM

## 2015-08-29 DIAGNOSIS — Z8249 Family history of ischemic heart disease and other diseases of the circulatory system: Secondary | ICD-10-CM

## 2015-08-29 DIAGNOSIS — D696 Thrombocytopenia, unspecified: Secondary | ICD-10-CM | POA: Diagnosis present

## 2015-08-29 LAB — COMPREHENSIVE METABOLIC PANEL
ALBUMIN: 3.8 g/dL (ref 3.5–5.0)
ALK PHOS: 61 U/L (ref 38–126)
ALT: 50 U/L (ref 17–63)
ANION GAP: 9 (ref 5–15)
AST: 127 U/L — ABNORMAL HIGH (ref 15–41)
BILIRUBIN TOTAL: 0.2 mg/dL — AB (ref 0.3–1.2)
BUN: 13 mg/dL (ref 6–20)
CALCIUM: 9.1 mg/dL (ref 8.9–10.3)
CO2: 25 mmol/L (ref 22–32)
CREATININE: 0.88 mg/dL (ref 0.61–1.24)
Chloride: 103 mmol/L (ref 101–111)
GFR calc Af Amer: >60 mL/min (ref >60)
GFR calc non Af Amer: >60 mL/min (ref >60)
GLUCOSE: 243 mg/dL — AB (ref 65–99)
Potassium: 4 mmol/L (ref 3.5–5.1)
Sodium: 137 mmol/L (ref 135–145)
TOTAL PROTEIN: 7.2 g/dL (ref 6.5–8.1)

## 2015-08-29 LAB — GLUCOSE, CAPILLARY
GLUCOSE-CAPILLARY: 206 mg/dL — AB (ref 65–99)
GLUCOSE-CAPILLARY: 233 mg/dL — AB (ref 65–99)
Glucose-Capillary: 171 mg/dL — ABNORMAL HIGH (ref 65–99)
Glucose-Capillary: 231 mg/dL — ABNORMAL HIGH (ref 65–99)

## 2015-08-29 LAB — LIPID PANEL
Cholesterol: 120 mg/dL (ref 0–200)
HDL: 37 mg/dL — AB (ref >40)
LDL CALC: 15 mg/dL (ref 0–99)
Total CHOL/HDL Ratio: 3.2 RATIO
Triglycerides: 339 mg/dL — ABNORMAL HIGH (ref <150)
VLDL: 68 mg/dL — ABNORMAL HIGH (ref 0–40)

## 2015-08-29 LAB — CBC
HEMATOCRIT: 41.8 % (ref 40.0–52.0)
HEMOGLOBIN: 13.6 g/dL (ref 13.0–18.0)
MCH: 29.9 pg (ref 26.0–34.0)
MCHC: 32.5 g/dL (ref 32.0–36.0)
MCV: 92.1 fL (ref 80.0–100.0)
Platelets: 100 10*3/uL — ABNORMAL LOW (ref 150–440)
RBC: 4.54 MIL/uL (ref 4.40–5.90)
RDW: 13.9 % (ref 11.5–14.5)
WBC: 4.3 10*3/uL (ref 3.8–10.6)

## 2015-08-29 LAB — TSH: TSH: 2.19 u[IU]/mL (ref 0.350–4.500)

## 2015-08-29 MED ORDER — INSULIN ASPART 100 UNIT/ML ~~LOC~~ SOLN
0.0000 [IU] | Freq: Three times a day (TID) | SUBCUTANEOUS | Status: DC
  Administered 2015-08-30: 5 [IU] via SUBCUTANEOUS
  Filled 2015-08-29: qty 1

## 2015-08-29 MED ORDER — TRAZODONE HCL 50 MG PO TABS: 150.0000 mg | ORAL_TABLET | Freq: Every evening | ORAL | Status: DC | PRN

## 2015-08-29 MED ORDER — SIMVASTATIN 20 MG PO TABS
20.0000 mg | ORAL_TABLET | Freq: Every day | ORAL | Status: DC
  Administered 2015-08-29 – 2015-09-04 (×8): 20 mg via ORAL
  Filled 2015-08-29 (×7): qty 1

## 2015-08-29 MED ORDER — ATENOLOL 25 MG PO TABS
75.0000 mg | ORAL_TABLET | Freq: Two times a day (BID) | ORAL | Status: DC
  Administered 2015-08-29 – 2015-09-04 (×11): 75 mg via ORAL
  Filled 2015-08-29: qty 3
  Filled 2015-08-29: qty 2
  Filled 2015-08-29 (×10): qty 3

## 2015-08-29 MED ORDER — AMLODIPINE BESYLATE 10 MG PO TABS
10.0000 mg | ORAL_TABLET | Freq: Every day | ORAL | Status: DC
  Administered 2015-08-29 – 2015-09-04 (×6): 10 mg via ORAL
  Filled 2015-08-29 (×7): qty 1

## 2015-08-29 MED ORDER — CLONIDINE HCL 0.1 MG PO TABS
0.3000 mg | ORAL_TABLET | Freq: Two times a day (BID) | ORAL | Status: DC
  Administered 2015-08-29 – 2015-09-04 (×11): 0.3 mg via ORAL
  Filled 2015-08-29 (×12): qty 3

## 2015-08-29 MED ORDER — LISINOPRIL 20 MG PO TABS: 20.0000 mg | ORAL_TABLET | Freq: Every day | ORAL | Status: DC

## 2015-08-29 MED ORDER — LISINOPRIL 20 MG PO TABS
20.0000 mg | ORAL_TABLET | Freq: Every day | ORAL | Status: DC
  Administered 2015-08-29: 20 mg via ORAL
  Filled 2015-08-29 (×2): qty 1

## 2015-08-29 MED ORDER — PANTOPRAZOLE SODIUM 40 MG PO TBEC
40.0000 mg | DELAYED_RELEASE_TABLET | Freq: Every day | ORAL | Status: DC
  Administered 2015-08-29 – 2015-09-03 (×5): 40 mg via ORAL
  Filled 2015-08-29 (×5): qty 1

## 2015-08-29 MED ORDER — METFORMIN HCL 500 MG PO TABS
1000.0000 mg | ORAL_TABLET | Freq: Two times a day (BID) | ORAL | Status: DC
  Administered 2015-08-29 – 2015-09-07 (×18): 1000 mg via ORAL
  Filled 2015-08-29 (×19): qty 2

## 2015-08-29 MED ORDER — HYDRALAZINE HCL 25 MG PO TABS
25.0000 mg | ORAL_TABLET | Freq: Two times a day (BID) | ORAL | Status: DC
  Administered 2015-08-29 – 2015-08-30 (×2): 25 mg via ORAL
  Filled 2015-08-29: qty 1
  Filled 2015-08-29: qty 2
  Filled 2015-08-29 (×2): qty 1

## 2015-08-29 MED ORDER — HYDRALAZINE HCL 25 MG PO TABS
25.0000 mg | ORAL_TABLET | Freq: Two times a day (BID) | ORAL | Status: DC
  Administered 2015-08-29: 25 mg via ORAL
  Filled 2015-08-29: qty 1

## 2015-08-29 MED ORDER — CHLORDIAZEPOXIDE HCL 25 MG PO CAPS
25.0000 mg | ORAL_CAPSULE | Freq: Four times a day (QID) | ORAL | Status: DC
  Administered 2015-08-29 – 2015-08-30 (×3): 25 mg via ORAL
  Filled 2015-08-29 (×3): qty 1

## 2015-08-29 MED ORDER — MAGNESIUM HYDROXIDE 400 MG/5ML PO SUSP: 30.0000 mL | Freq: Every day | ORAL | Status: DC | PRN

## 2015-08-29 MED ORDER — ACETAMINOPHEN 325 MG PO TABS
650.0000 mg | ORAL_TABLET | Freq: Four times a day (QID) | ORAL | Status: DC | PRN
  Administered 2015-08-30 – 2015-09-03 (×2): 650 mg via ORAL
  Filled 2015-08-29 (×2): qty 2

## 2015-08-29 MED ORDER — ALUM & MAG HYDROXIDE-SIMETH 200-200-20 MG/5ML PO SUSP: 30.0000 mL | ORAL | Status: DC | PRN

## 2015-08-29 MED ORDER — GLIMEPIRIDE 2 MG PO TABS
4.0000 mg | ORAL_TABLET | Freq: Every day | ORAL | Status: DC
  Administered 2015-08-30 – 2015-09-05 (×7): 4 mg via ORAL
  Filled 2015-08-29: qty 1
  Filled 2015-08-29: qty 2
  Filled 2015-08-29 (×6): qty 1
  Filled 2015-08-29: qty 2
  Filled 2015-08-29 (×2): qty 1

## 2015-08-29 MED ORDER — ARIPIPRAZOLE 10 MG PO TABS
5.0000 mg | ORAL_TABLET | Freq: Every day | ORAL | Status: DC
  Administered 2015-08-29: 5 mg via ORAL
  Filled 2015-08-29: qty 1

## 2015-08-29 MED ORDER — HYDRALAZINE HCL 25 MG PO TABS: 25.0000 mg | ORAL_TABLET | Freq: Two times a day (BID) | ORAL | Status: DC

## 2015-08-29 MED ORDER — NICOTINE 21 MG/24HR TD PT24
21.0000 mg | MEDICATED_PATCH | Freq: Every day | TRANSDERMAL | Status: DC
  Administered 2015-09-02: 21 mg via TRANSDERMAL
  Filled 2015-08-29 (×3): qty 1

## 2015-08-29 MED ORDER — NALTREXONE HCL 50 MG PO TABS
50.0000 mg | ORAL_TABLET | Freq: Every day | ORAL | Status: DC
  Administered 2015-08-29 – 2015-09-03 (×5): 50 mg via ORAL
  Filled 2015-08-29 (×6): qty 1

## 2015-08-29 NOTE — Progress Notes (Signed)
Admission note:   D: [facility] with [facility].  Patient presents with a long history of depression and medical dx.  Patient reports that he took 3 weeks worth of medications to overdose.  Patient speech is soft and slurred at times.  Patient in scrubs.  Patient denies any SI/HI at this time.  Patient denies any visual hallucinations but states he sometimes hears voices that are not there.  Patient complaint with medications.  Patient pleasant and cooperative.  Patient in no distress at this time.   A: skin and belongings search completed with no contraband found.  Patient advised of unit polices and rules.  Patient shown and oriented to the unit.  Medications given as prescribed.  Support and encouragement provided. q 15 min checks done.   R: patient receptive of care and will notify staff if symptoms of depression worsen.

## 2015-08-29 NOTE — Clinical Social Work Psych Note (Addendum)
Patient will discharge today per MD order. Patient will discharge to: [facility] RN to call report prior to transportation to: [phone] Transportation: [place] [phone] called at 1:13pm  Note: no packet needed for this transfer Patient is voluntary  CSW updated RN, MD, RNCM and unit CSW.    Nonnie Done, [place] [phone]  Psychiatric & Orthopedics (5N 1-16) Clinical Social Worker

## 2015-08-29 NOTE — Tx Team (Signed)
Initial Interdisciplinary Treatment Plan   PATIENT STRESSORS: Health problems Marital or family conflict Substance abuse   PATIENT STRENGTHS: Agricultural engineer for treatment/growth Supportive family/friends   PROBLEM LIST: Problem List/Patient Goals Date to be addressed Date deferred Reason deferred Estimated date of resolution  Schizoaffective disorder  08/29/2015     Alcohol use disorder  08/29/2015     Tobacco use  08/29/2015                                          DISCHARGE CRITERIA:  Ability to meet basic life and health needs Improved stabilization in mood, thinking, and/or behavior Motivation to continue treatment in a less acute level of care  PRELIMINARY DISCHARGE PLAN: Return to previous living arrangement  PATIENT/FAMIILY INVOLVEMENT: This treatment plan has been presented to and reviewed with the patient, [name].  The patient and family have been given the opportunity to ask questions and make suggestions.  Viviann Spare 08/29/2015, 5:16 PM

## 2015-08-29 NOTE — Care Management Note (Signed)
Case Management Note  Patient Details  Name: [name] MRN: [id number] Date of Birth: 10-29-1961  Subjective/Objective:    Pt admitted for overdose.                 Action/Plan: Plan for d/c to behavioral health today. CSW assisting with disposition needs. No further needs from [facility] at this time.  Expected Discharge Date:  08/28/15               [facility]  In-House Referral:  Clinical Social Work  Discharge planning Services  CM Consult  Post Acute Care Choice:  NA Choice offered to:  NA  DME Arranged:  N/A DME Agency:  NA  HH Arranged:  NA HH Agency:  NA  Status of Service:  Completed, signed off  Medicare Important Message Given:    Date Medicare IM Given:    Medicare IM give by:    Date Additional Medicare IM Given:    Additional Medicare Important Message give by:     If discussed at [place] of Stay Meetings, dates discussed:    Additional Comments:  [name], RN 08/29/2015, 11:44 AM

## 2015-08-29 NOTE — Progress Notes (Signed)
Signed out from [employer] and Given to [place] from patient , (1) key, (1) phone, (1) wallet, ss card, [place] card, Master card ending in [place], [place] ID card.

## 2015-08-29 NOTE — BHH Counselor (Signed)
[facility] is reviewing for admission.  Deland Pretty, LPCA Therapeutic Triage Specialist

## 2015-08-29 NOTE — Progress Notes (Signed)
Patient being transferred to [facility] to room 322, Report was called to [facility], Therapist, sports. Sitter will ride With patient.

## 2015-08-29 NOTE — Progress Notes (Signed)
Patient seen this morning, stable, resumed his home Lisinopril and Hydralazine, no other major changes, ready for D/C when bed available. For details, refer to D/C summary 10/10.  Terryon Pineiro M. Cruzita Lederer, MD [facility] [phone]

## 2015-08-29 NOTE — Progress Notes (Signed)
Patient ID: [name], male   DOB: Feb 02, 1961, 54 y.o.   MRN: [id number] RN notified pt that he would be getting meds soon and also have his blood pressure checked due to the type of medications he is on. Pt was agreeable to this, and asked when. RN told pt she would find him when she needed him. Pt agreed and turned back over in bed.
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
## 2015-08-29 NOTE — Progress Notes (Signed)
    Pt. is to being admitted to [facility] by Dr. Bary Leriche. Attending Physician will be Dr. Jerilee Hoh.  Pt. has been assigned to room 322, by [facility].  Patient Access have been made aware of the admission.   08/29/2015 Con Memos, MS, [place], LPCA Therapeutic Triage Specialist

## 2015-08-30 DIAGNOSIS — F1020 Alcohol dependence, uncomplicated: Secondary | ICD-10-CM

## 2015-08-30 DIAGNOSIS — F1014 Alcohol abuse with alcohol-induced mood disorder: Secondary | ICD-10-CM

## 2015-08-30 DIAGNOSIS — I10 Essential (primary) hypertension: Secondary | ICD-10-CM

## 2015-08-30 DIAGNOSIS — F10129 Alcohol abuse with intoxication, unspecified: Secondary | ICD-10-CM

## 2015-08-30 DIAGNOSIS — F10239 Alcohol dependence with withdrawal, unspecified: Secondary | ICD-10-CM

## 2015-08-30 DIAGNOSIS — F10939 Alcohol use, unspecified with withdrawal, unspecified: Secondary | ICD-10-CM

## 2015-08-30 DIAGNOSIS — F3289 Other specified depressive episodes: Secondary | ICD-10-CM

## 2015-08-30 LAB — URINALYSIS COMPLETE WITH MICROSCOPIC (ARMC ONLY)
Bacteria, UA: NONE SEEN
Bilirubin Urine: NEGATIVE
GLUCOSE, UA: 50 mg/dL — AB
HGB URINE DIPSTICK: NEGATIVE
Ketones, ur: NEGATIVE mg/dL
Leukocytes, UA: NEGATIVE
NITRITE: NEGATIVE
Protein, ur: NEGATIVE mg/dL
RBC / HPF: NONE SEEN RBC/hpf (ref 0–5)
SPECIFIC GRAVITY, URINE: 1.011 (ref 1.005–1.030)
pH: 5 (ref 5.0–8.0)

## 2015-08-30 LAB — GLUCOSE, CAPILLARY
GLUCOSE-CAPILLARY: 209 mg/dL — AB (ref 65–99)
GLUCOSE-CAPILLARY: 140 mg/dL — AB (ref 65–99)
Glucose-Capillary: 174 mg/dL — ABNORMAL HIGH (ref 65–99)
Glucose-Capillary: 166 mg/dL — ABNORMAL HIGH (ref 65–99)

## 2015-08-30 LAB — HEMOGLOBIN A1C: Hgb A1c MFr Bld: 7.5 % — ABNORMAL HIGH (ref 4.0–6.0)

## 2015-08-30 MED ORDER — BENZOCAINE 10 % MT GEL
Freq: Three times a day (TID) | OROMUCOSAL | Status: DC
  Administered 2015-09-03 – 2015-09-04 (×2): via OROMUCOSAL
  Filled 2015-08-30: qty 9.4

## 2015-08-30 MED ORDER — IBUPROFEN 600 MG PO TABS: 600.0000 mg | ORAL_TABLET | Freq: Four times a day (QID) | ORAL | Status: DC | PRN

## 2015-08-30 MED ORDER — HYDRALAZINE HCL 25 MG PO TABS
50.0000 mg | ORAL_TABLET | Freq: Three times a day (TID) | ORAL | Status: DC
  Administered 2015-08-31 (×3): 50 mg via ORAL
  Filled 2015-08-30 (×3): qty 2

## 2015-08-30 MED ORDER — CHLORDIAZEPOXIDE HCL 25 MG PO CAPS
25.0000 mg | ORAL_CAPSULE | Freq: Two times a day (BID) | ORAL | Status: DC
  Administered 2015-08-31 (×2): 25 mg via ORAL
  Filled 2015-08-30 (×2): qty 1

## 2015-08-30 MED ORDER — LISINOPRIL 20 MG PO TABS
20.0000 mg | ORAL_TABLET | Freq: Every day | ORAL | Status: DC
  Administered 2015-08-30 – 2015-09-04 (×5): 20 mg via ORAL
  Filled 2015-08-30 (×4): qty 1

## 2015-08-30 MED ORDER — INSULIN ASPART 100 UNIT/ML ~~LOC~~ SOLN
0.0000 [IU] | Freq: Three times a day (TID) | SUBCUTANEOUS | Status: DC
  Administered 2015-08-30: 2 [IU] via SUBCUTANEOUS

## 2015-08-30 MED ORDER — INSULIN ASPART 100 UNIT/ML ~~LOC~~ SOLN
0.0000 [IU] | Freq: Every day | SUBCUTANEOUS | Status: DC
  Filled 2015-08-30: qty 3

## 2015-08-30 MED ORDER — HYDRALAZINE HCL 25 MG PO TABS
25.0000 mg | ORAL_TABLET | Freq: Three times a day (TID) | ORAL | Status: DC
  Administered 2015-08-30: 25 mg via ORAL
  Filled 2015-08-30: qty 1

## 2015-08-30 MED ORDER — IBUPROFEN 800 MG PO TABS
800.0000 mg | ORAL_TABLET | Freq: Once | ORAL | Status: AC
  Administered 2015-08-30: 800 mg via ORAL
  Filled 2015-08-30: qty 1

## 2015-08-30 MED ORDER — INSULIN ASPART 100 UNIT/ML ~~LOC~~ SOLN
0.0000 [IU] | Freq: Three times a day (TID) | SUBCUTANEOUS | Status: DC
  Administered 2015-08-30: 3 [IU] via SUBCUTANEOUS
  Administered 2015-08-31 (×2): 2 [IU] via SUBCUTANEOUS
  Administered 2015-08-31 – 2015-09-01 (×2): 3 [IU] via SUBCUTANEOUS
  Administered 2015-09-02: 2 [IU] via SUBCUTANEOUS
  Administered 2015-09-02: 5 [IU] via SUBCUTANEOUS
  Administered 2015-09-03: 3 [IU] via SUBCUTANEOUS
  Administered 2015-09-03 – 2015-09-06 (×5): 2 [IU] via SUBCUTANEOUS
  Filled 2015-08-30: qty 1
  Filled 2015-08-30 (×3): qty 2

## 2015-08-30 NOTE — Progress Notes (Signed)
Urine sample obtained with results pending.  

## 2015-08-30 NOTE — Progress Notes (Signed)
Paged [phone] at this time with MD for internal med/hospitalist. Patient with no distress, no discomfort. Sleeping at this time.

## 2015-08-30 NOTE — BHH Group Notes (Signed)
[place] Group Notes:  (Nursing/MHT/Case Management/Adjunct)  Date:  08/30/2015  Time:  4:03 AM  Type of Therapy:  Group Therapy  Participation Level:  Minimal  Participation Quality:  Appropriate  Affect:  Appropriate and Flat  Cognitive:  Alert  Insight:  Limited  Engagement in Group:  Limited  Modes of Intervention:  n/a  Summary of Progress/Problems:  [name] 08/30/2015, 4:03 AM

## 2015-08-30 NOTE — Progress Notes (Signed)
Phoned [phone] for lifestyle diabetes education and left message.

## 2015-08-30 NOTE — BHH Group Notes (Signed)
BHH LCSW Group Therapy  08/30/2015 3:00 PM  Type of Therapy:  Group Therapy  Participation Level:  Did Not Attend  Modes of Intervention:  Discussion, Education, Socialization and Support  Summary of Progress/Problems: Emotional Regulation: Patients will identify both negative and positive emotions. They will discuss emotions they have difficulty regulating and how they impact their lives. Patients will be asked to identify healthy coping skills to combat unhealthy reactions to negative emotions.     Lindsay Straka L Eisha Chatterjee MSW, LCSWA  08/30/2015, 3:00 PM  

## 2015-08-30 NOTE — Consult Note (Signed)
[place] [place] NAME: [name]    MR#:  [id number]  DATE OF BIRTH:  Nov 10, 1961  DATE OF ADMISSION:  08/29/2015  PRIMARY CARE PHYSICIAN: [facility]   REQUESTING/REFERRING PHYSICIAN: Hildred Priest, MD  CHIEF COMPLAINT:  No chief complaint on file.  HISTORY OF PRESENT ILLNESS:  [name]  is a 54 y.o. male with a known history of hypertension diabetes is being seen for management of diabetes and hypertension.  Patient was admitted to psychiatry service for  Schizoaffective disorder and depression management and was noted to have uncontrolled diabetes and hypertension for which we are being consulted.  Looking at his last several blood pressure reading is running anywhere from 160s to 180s inspite of Being on several different blood pressure medicine.  His blood sugar has been running anywhere from 170s to 230s.  He is asymptomatic, lying in the bed.  He does report history of hypertension for at least 25-30 years and has been difficult to control even as an outpatient. PAST MEDICAL HISTORY:   Past Medical History  Diagnosis Date  . Drug abuse   . Alcohol abuse   . Diabetes mellitus   . Uncontrolled hypertension   . Arthritis   . Gout   . DTs (delirium tremens) ([place])     history of   . Crack cocaine use   . Noncompliance with medication regimen   . Depression   . Schizophrenia ([place])   . Stroke [facility])    PAST SURGICAL HISTOIRY:   Past Surgical History  Procedure Laterality Date  . Mandible reconstruction     SOCIAL HISTORY:   Social History  Substance Use Topics  . Smoking status: Current Every Day Smoker -- 0.25 packs/day    Types: Cigarettes  . Smokeless tobacco: Never Used  . Alcohol Use: 0.6 - 1.2 oz/week    1-2 Cans of beer per week     Comment: [facility] rehab   FAMILY HISTORY:   Family History  Problem Relation Age of Onset  . Hypertension     DRUG  ALLERGIES:  No Known Allergies REVIEW OF SYSTEMS:  CONSTITUTIONAL: No fever, fatigue or weakness.  EYES: No blurred or double vision.  EARS, NOSE, AND THROAT: No tinnitus or ear pain.  RESPIRATORY: No cough, shortness of breath, wheezing or hemoptysis.  CARDIOVASCULAR: No chest pain, orthopnea, edema.  GASTROINTESTINAL: No nausea, vomiting, diarrhea or abdominal pain.  GENITOURINARY: No dysuria, hematuria.  ENDOCRINE: No polyuria, nocturia,  HEMATOLOGY: No anemia, easy bruising or bleeding SKIN: No rash or lesion. MUSCULOSKELETAL: No joint pain or arthritis.   NEUROLOGIC: No tingling, numbness, weakness.  PSYCHIATRY: No anxiety or depression.  MEDICATIONS AT HOME:   Prior to Admission medications   Medication Sig Start Date End Date Taking? Authorizing Provider  amLODipine (NORVASC) 10 MG tablet Take 1 tablet (10 mg total) by mouth daily. 05/02/15  Yes Niel Hummer, NP  ARIPiprazole (ABILIFY) 5 MG tablet Take 1 tablet (5 mg total) by mouth every evening. Patient taking differently: Take 5 mg by mouth at bedtime.  05/02/15  Yes Niel Hummer, NP  atenolol (TENORMIN) 50 MG tablet Take 100 mg (2 tablets) by mouth in the morning, and 50 mg (1 tablet) by mouth in the evening. 06/04/15  Yes Sherwood Gambler, MD  cloNIDine (CATAPRES) 0.3 MG tablet Take 1 tablet (0.3 mg total) by mouth 2 (two) times daily. 05/02/15  Yes Niel Hummer, NP  glimepiride (AMARYL)  4 MG tablet Take 1 tablet (4 mg total) by mouth daily with breakfast. 05/03/15  Yes Niel Hummer, NP  hydrALAZINE (APRESOLINE) 25 MG tablet Take 1 tablet (25 mg total) by mouth every 12 (twelve) hours. 08/29/15  Yes Costin Karlyne Greenspan, MD  lisinopril (PRINIVIL,ZESTRIL) 20 MG tablet Take 1 tablet (20 mg total) by mouth daily. 05/03/15  Yes Niel Hummer, NP  metFORMIN (GLUCOPHAGE) 1000 MG tablet Take 1 tablet (1,000 mg total) by mouth 2 (two) times daily with a meal. 05/02/15  Yes Niel Hummer, NP  Multiple Vitamin (MULTIVITAMIN WITH MINERALS) TABS  tablet Take 1 tablet by mouth daily. 05/02/15  Yes Niel Hummer, NP  naltrexone (DEPADE) 50 MG tablet Take 50 mg by mouth daily.   Yes Historical Provider, MD  naproxen (NAPROSYN) 500 MG tablet Take 1 tablet (500 mg total) by mouth 2 (two) times daily. 06/29/15  Yes Hope Bunnie Pion, NP  Paliperidone Palmitate (INVEGA TRINZA) 410 MG/1.315ML SUSP Inject 410 mg into the muscle every 3 (three) months. Last injection 07/19/15 at [facility]   Yes Historical Provider, MD  simvastatin (ZOCOR) 20 MG tablet Take 1 tablet (20 mg total) by mouth daily at 6 PM. Patient taking differently: Take 20 mg by mouth daily.  05/02/15  Yes Niel Hummer, NP      VITAL SIGNS:  Blood pressure 172/122, pulse 80, temperature 98.3 F (36.8 C), temperature source Oral, resp. rate 16, height 5\' 11"  (1.803 m), weight 94.348 kg (208 lb), SpO2 100 %. PHYSICAL EXAMINATION:  GENERAL:  54 y.o.-year-old patient lying in the bed with no acute distress.  EYES: Pupils equal, round, reactive to light and accommodation. No scleral icterus. Extraocular muscles intact.  HEENT: Head atraumatic, normocephalic. Oropharynx and nasopharynx clear.  NECK:  Supple, no jugular venous distention. No thyroid enlargement, no tenderness.  LUNGS: Normal breath sounds bilaterally, no wheezing, rales,rhonchi or crepitation. No use of accessory muscles of respiration.  CARDIOVASCULAR: S1, S2 normal. No murmurs, rubs, or gallops.  ABDOMEN: Soft, nontender, nondistended. Bowel sounds present. No organomegaly or mass.  EXTREMITIES: No pedal edema, cyanosis, or clubbing.  NEUROLOGIC: Cranial nerves II through XII are intact. Muscle strength 5/5 in all extremities. Sensation intact. Gait not checked.  PSYCHIATRIC: The patient is alert and oriented x 3.  SKIN: No obvious rash, lesion, or ulcer. LABORATORY PANEL:   CBC  Recent Labs Lab 08/29/15 1811  WBC 4.3  HGB 13.6  HCT 41.8  PLT 100*    ------------------------------------------------------------------------------------------------------------------  Chemistries   Recent Labs Lab 08/29/15 1811  NA 137  K 4.0  CL 103  CO2 25  GLUCOSE 243*  BUN 13  CREATININE 0.88  CALCIUM 9.1  AST 127*  ALT 50  ALKPHOS 61  BILITOT 0.2*   ------------------------------------------------------------------------------------------------------------------  Cardiac Enzymes  Recent Labs Lab 08/27/15 1237  TROPONINI <0.03   ------------------------------------------------------------------------------------------------------------------  RADIOLOGY:  No results found.  EKG:   Orders placed or performed during the hospital encounter of 08/26/15  . EKG 12-Lead  . EKG 12-Lead  . EKG 12-Lead  . EKG 12-Lead  . EKG   IMPRESSION AND PLAN:   * Uncontrolled hypertension: Patient's blood pressure has been running in the 123456 to [id number] systolic.  We will continue his current medication including atenolol, amlodipine, clonidine, lisinopril and hydralazine.  We will increase the dose of hydralazine to 50 mg 3 times a day.  At this time We will also add when necessary hydralazine for better blood pressure control.  We  will monitor and adjust as needed  * Uncontrolled Diabetes mellitus: Patient is on Amaryl and low-dose sliding scale insulin.  Sugars are running anywhere from 170s to 230s.  At this time, will increased his sliding scale to high dose.  Check hemoglobin A1c and we will consult diabetic nurse coordinator.  * Hyperlipidemia: Continue statin  * Depression and schizoaffective disorder: Management per psychiatry  All the records are reviewed and case discussed with Consulting provider. Management plans discussed with the patient and he is in agreement.  CODE STATUS: Full code  TOTAL TIME TAKING CARE OF THIS PATIENT: 45 minutes.    [facility], Jerriah Ines M.D on 08/30/2015 at 4:00 PM  Between 7am to 6pm - Pager -  [phone]  After 6pm go to [email or web address] - password [email or web address] [place]  Office  [phone]  CC: Primary care Physician: [place]

## 2015-08-30 NOTE — Progress Notes (Signed)
Internalist here to visit patient. Considers changes to meds. Patient with no distress, no complaint. Resting in bed. Safety maintained.

## 2015-08-30 NOTE — Progress Notes (Signed)
Recreation Therapy Notes  Date: 10.12.16 Time: 3:00 pm Location: Craft Room  Group Topic: Self-Esteem  Goal Area(s) Addresses:  Patient will write at least one positive trait about self. Patient will verbalize benefit of having a healthy self-esteem.  Behavioral Response: Did not attend  Intervention: I Am  Activity: Patients were given a worksheet with the letter I on it and instructed to fill it with positive traits about themselves.  Education: LRT educated patients on ways they can increase their self-esteem.  Education Outcome: Patient did not attend group.  Clinical Observations/Feedback: Patient did not attend group.  [name], LRT/CTRS 08/30/2015 4:11 PM

## 2015-08-30 NOTE — BHH Suicide Risk Assessment (Signed)
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 Malignant hypertension [I10] 08/30/2015  . Alcohol use disorder, severe, dependence ([place]) [F10.20] 08/30/2015  . Alcohol withdrawal ([place]) [F10.239] 08/30/2015  . 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 Thrombocytopenia ([place]) [D69.6] 08/27/2015  . Tobacco use disorder [F17.200] 08/26/2015  . 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 Hyperlipidemia [E78.5] 04/28/2015  . Cocaine use disorder, severe, dependence ([place]) [F14.20] 04/27/2015  . Gout [M10.9] 08/09/2013  . Diabetes mellitus ([place]) [E11.9] 07/09/2012     Continued Clinical Symptoms:  Alcohol Use Disorder Identification Test Final Score (AUDIT): 8 The "Alcohol Use Disorders Identification Test", Guidelines for Use in Primary Care, Second Edition.  World Pharmacologist [facility]). Score between 0-7:  no or low risk or alcohol related problems. Score between 8-15:  moderate risk of alcohol related problems. Score between 16-19:  high risk of alcohol related problems. Score 20 or above:  warrants further diagnostic evaluation for alcohol dependence and treatment.   CLINICAL FACTORS:   Depression:   Comorbid alcohol abuse/dependence Impulsivity Alcohol/Substance Abuse/Dependencies More than one psychiatric diagnosis Previous Psychiatric Diagnoses and Treatments Medical Diagnoses and Treatments/Surgeries   Musculoskeletal:  Psychiatric Specialty Exam: Physical Exam  ROS                                                            COGNITIVE FEATURES THAT CONTRIBUTE TO RISK:  Closed-mindedness    SUICIDE RISK:   Mild:  Suicidal ideation of limited frequency, intensity, duration, and specificity.  There are no identifiable plans, no associated intent, mild dysphoria and related symptoms, good self-control (both objective and subjective assessment), few other risk factors, and identifiable protective factors, including available and accessible social support.  PLAN OF CARE: admit to [place] Making:  Established Problem, Stable/Improving (1)  I certify that inpatient services furnished can reasonably be expected to improve the patient's condition.   Hildred Priest 08/30/2015, 10:48 AM

## 2015-08-30 NOTE — Progress Notes (Signed)
Recreation Therapy Notes  INPATIENT RECREATION THERAPY ASSESSMENT  Patient Details Name: Vance Rasberry MRN: [id number] DOB: June 06, 1961 Today's Date: 08/30/2015  Patient Stressors: Family, Relationship, Friends, Other (Comment) (Lack of friends; "the world")  Coping Skills:   Isolate, Arguments, Substance Abuse, Avoidance, Exercise, Art/Dance, Music, Sports  Personal Challenges: Anger, Communication, Concentration, Decision-Making, Expressing Yourself, Problem-Solving, Relationships, Self-Esteem/Confidence, Social Interaction, Stress Management, Substance Abuse, Trusting Others  Leisure Interests (2+):  Music - Play instrument, Individual - Other (Comment) (Travel)  Awareness of Community Resources:  Yes  Community Resources:  Park, Art therapist  Current Use: No  If no, Barriers?: Other (Comment) (Started talking to himself)  Patient Strengths:  Alive, breathing  Patient Identified Areas of Improvement:  Communication and making new friends  Current Recreation Participation:  Praying  Patient Goal for Hospitalization:  To get well  [place] of Residence:  [place] of Residence:  [place]   Current [place] (including self-harm):  No  Current [place]:  No  Consent to Intern Participation: N/A   [name], LRT/CTRS 08/30/2015, 4:56 PM

## 2015-08-30 NOTE — BHH Group Notes (Signed)
[facility] LCSW Aftercare Discharge Planning Group Note   08/30/2015 10:42 AM  Participation Quality:  Patient did not attend group.   Keene Breath, MSW, LCSWA

## 2015-08-30 NOTE — Progress Notes (Signed)
Patient vital signs monitored and recorded. Patient c/o nausea, anxiety and headache and prn Tylenol 650 administered. Patient returns to bed. MD orders Hospitalist consult and writer pages at this time.
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
## 2015-08-30 NOTE — Progress Notes (Signed)
Patient with depressed affect, sleepy in bed this am. Blood pressure and blood glucose monitored. No SI/HI at this time. States he feels discomfort to head and mouth. States he is dizzy. Ambulates with slow, steady gait and supervision of Probation officer. Rolling walker available, patient refuses at times. States he is nauseous, able to eat meals, with no emesis. Good adls. Phoned hospitalist x2 and awaiting response. MD aware. Safety maintained.

## 2015-08-30 NOTE — Progress Notes (Signed)
Pt quietly resting in room.

## 2015-08-31 LAB — GLUCOSE, CAPILLARY
GLUCOSE-CAPILLARY: 123 mg/dL — AB (ref 65–99)
Glucose-Capillary: 198 mg/dL — ABNORMAL HIGH (ref 65–99)
Glucose-Capillary: 147 mg/dL — ABNORMAL HIGH (ref 65–99)
Glucose-Capillary: 139 mg/dL — ABNORMAL HIGH (ref 65–99)

## 2015-08-31 LAB — HEMOGLOBIN A1C: Hgb A1c MFr Bld: 7.6 % — ABNORMAL HIGH (ref 4.0–6.0)

## 2015-08-31 MED ORDER — HYDRALAZINE HCL 50 MG PO TABS
100.0000 mg | ORAL_TABLET | Freq: Three times a day (TID) | ORAL | Status: DC
  Administered 2015-08-31 – 2015-09-04 (×10): 100 mg via ORAL
  Filled 2015-08-31 (×13): qty 2

## 2015-08-31 MED ORDER — TUBERCULIN PPD 5 UNIT/0.1ML ID SOLN
5.0000 [IU] | Freq: Once | INTRADERMAL | Status: AC
  Administered 2015-09-01: 5 [IU] via INTRADERMAL
  Filled 2015-08-31: qty 0.1

## 2015-08-31 NOTE — BHH Counselor (Signed)
Adult Comprehensive Assessment  Patient ID: [name], male   DOB: Aug 16, 1961, 54 y.o.   MRN: [id number]  Information Source: Information source: Patient  Current Stressors:  Family Relationships: girlfriend broke up with him Financial / Lack of resources (include bankruptcy): disability decrease4de to $400 monthly till he finds placement Housing / Lack of housing: homeless as girlfriend kicked him out Physical health (include injuries & life threatening diseases): HTN and hx of stroke Substance abuse: polysubstance use  Living/Environment/Situation:  Living Arrangements: Children (temporarily with chidlren)  Family History:  Marital status: Other (comment) (girldfriend just broke up) Does patient have children?: Yes  Childhood History:     Education:  Currently a Ship broker?: No  Employment/Work Situation:   Employment situation: On disability  Pensions consultant:   Financial resources: Teacher, early years/pre Does patient have a Programmer, applications or guardian?: No  Alcohol/Substance Abuse:   What has been your use of drugs/alcohol within the last 12 months?: alcohol and cocaine If attempted suicide, did drugs/alcohol play a role in this?: Yes Alcohol/Substance Abuse Treatment Hx: Past Tx, Outpatient, Past Tx, Inpatient  Social Support System:   Patient's Community Support System: Fair  Leisure/Recreation:      Strengths/Needs:      Discharge Plan:   Does patient have access to transportation?: Yes Plan for no access to transportation at discharge: ACT team Will patient be returning to same living situation after discharge?: No Plan for living situation after discharge: seeking pasarr for ALF placemetn Currently receiving community mental health services: Yes (From Whom) ([name]) Does patient have financial barriers related to discharge medications?: No  Summary/Recommendations:  Patient is a single 54 yo AA male states he is tired of living with  girlfriend and went through a breakup and attempted suicide by overdose. Patient reports alcohol and cocaine use and is currently interested in ALF placement. Patient is followed by his [facility] ACT team. Patient also has a hx of stroke, HTN, polysubstance abuse, schizophrenia, depression, alcohol abuse, and diabetes. Patient is disabled receiving $400 monthly disability and this will increase once he finds placement. Patient is encouraged to participate in medication managment, group therapy, and theapeutic milieu.    Keene Breath., MSW, Latanya Presser  08/31/2015

## 2015-08-31 NOTE — Progress Notes (Signed)
[facility] MD Progress Note  08/31/2015 1:50 PM [name]  MRN:  [id number] Subjective:  Patient reports feeling very tired and sedated this morning. He has been secluded to his room since yesterday on leaving for meals.  Patient has not been attending any groups.  Denies SI, HI or A/VH.  Mood is graded as 7/10.  Denies problems with sleep or appetite. Does report poor concentration, poor energy.  Denies any symptoms consistent with alcohol withdrawal.  Denies other physical complaints or  side effects other than the sedation.  Per nursing: D: Pt denies SI/HI/AVH. Pt is pleasant and cooperative. Pt stated he feels better from sleeping, he appears less anxious and he is interacting with peers and staff appropriately.  A: Pt was offered support and encouragement. Pt was given scheduled medications. Pt was encouraged to attend groups. Q 15 minute checks were done for safety.  R: Pt did not attend group and is not interacting with peers . Pt is taking medication. Pt has no complaints.Pt receptive to treatment and safety maintained on unit.  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 Malignant hypertension [I10] 08/30/2015  . Alcohol use disorder, severe, dependence ([place]) [F10.20] 08/30/2015  . Alcohol withdrawal ([place]) [F10.239] 08/30/2015  . 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 Thrombocytopenia ([place]) [D69.6] 08/27/2015  . Tobacco use disorder [F17.200] 08/26/2015  . Hyperprolactinemia ([place]) [E22.1] 04/29/2015  . Hyperlipidemia [E78.5] 04/28/2015  . Cocaine use disorder, severe, dependence ([place]) [F14.20] 04/27/2015  . Gout [M10.9] 08/09/2013  . Diabetes mellitus ([place]) [E11.9] 07/09/2012   Total Time spent with patient: 30 minutes  Past Psychiatric  History: Patient has been diagnosed with schizoaffective disorder, polysubstance abuse and has been receiving outpatient medication management from [facility] and also follow with the ACT team.  Patient reports multiple psychiatric hospitalizations mainly at behavioral health in [place] but has also been hospitalized in [facility]. The patient reports a multitude of suicidal attempts throughout his life mainly by overdose.  Risk to Self: Is patient at risk for suicide?: No Risk to Others:  no Prior Inpatient Therapy:  yes Prior Outpatient Therapy:  yes  Alcohol Screening: 1. How often do you have a drink containing alcohol?: 4 or more times a week 2. How many drinks containing alcohol do you have on a typical day when you are drinking?: 3 or 4 3. How often do you have six or more drinks on one occasion?: Weekly Preliminary Score: 4 4. How often during the last year have you found that you were not able to stop drinking once you had started?: Never 5. How often during the last year have you failed to do what was normally expected from you becasue of drinking?: Never 6. How often during the last year have you needed a first drink in the morning to get yourself going after a heavy drinking session?: Never 7. How often during the last year have you had a feeling of guilt of remorse after drinking?: Never 8. How often during the last year have you been unable to remember what happened the night before because you had been drinking?: Never 9. Have you or someone else been injured as a result of your drinking?: No 10. Has a relative or friend or a doctor or another health worker been concerned about your drinking or suggested  you cut down?: No Alcohol Use Disorder Identification Test Final Score (AUDIT): 8 Brief Intervention: Patient declined brief intervention Substance Abuse History in the last 12 months: Yes.  Consequences of Substance Abuse: Medical Consequences: poor compliance,  worsening mood. Previous Psychotropic Medications: Yes  Psychological Evaluations: No   Past Medical History: Patient follows up at [facility] in [place]. Past Medical History  Diagnosis Date  . Drug abuse   . Alcohol abuse   . Diabetes mellitus   . Uncontrolled hypertension   . Arthritis   . Gout   . DTs (delirium tremens) ([place])     history of   . Crack cocaine use   . Noncompliance with medication regimen   . Depression   . Schizophrenia ([place])   . Stroke [facility])     Past Surgical History  Procedure Laterality Date  . Mandible reconstruction     Family History:  Family History  Problem Relation Age of Onset  . Hypertension     Family Psychiatric History: Patient reports that his mother was "mental". Denies history of suicidal attempts in his family. Denies history of substance abuse.  Social History: Patient reports he has been receiving disability for the last 8 years for mental health issues. He is currently separated from his wife. He has 7 children were grown. He has on and off contact with them. For the last 3 years he has been living with his girlfriend but is states he does not want to return and live with her. She pressed charges on him a few months ago and he has a court hearing on October 20 for assault on a male. As far as his legal history patient states he's been in prison 3 times. His last hospitalization and longest was from 1990-1994. The patient does not want to disclose what his charges were but denies being a registered sex offender. He has a daughter who is 3 years old lives in [place] and is hoping he can go and stay with her.  History  Alcohol Use  . 0.6 - 1.2 oz/week  . 1-2 Cans of beer per week    Comment: [facility]    History  Drug Use  . Yes  . Special: "Crack" cocaine, Cocaine    Social History   Social History  . Marital Status: Legally  Separated    Spouse Name: N/A  . Number of Children: N/A  . Years of Education: N/A   Social History Main Topics  . Smoking status: Current Every Day Smoker -- 0.25 packs/day    Types: Cigarettes  . Smokeless tobacco: Never Used  . Alcohol Use: 0.6 - 1.2 oz/week    1-2 Cans of beer per week     Comment: [facility]  . Drug Use: Yes    Special: "Crack" cocaine, Cocaine  . Sexual Activity: Not Asked   Other Topics Concern  . None   Social History Narrative        Sleep: Good  Appetite:  Good  Current Medications: Current Facility-Administered Medications  Medication Dose Route Frequency Provider Last Rate Last Dose  . acetaminophen (TYLENOL) tablet 650 mg  650 mg Oral Q6H PRN Clovis Fredrickson, MD   650 mg at 08/30/15 0948  . alum & mag hydroxide-simeth (MAALOX/MYLANTA) 200-200-20 MG/5ML suspension 30 mL  30 mL Oral Q4H PRN Jolanta B Pucilowska, MD      . amLODipine (NORVASC) tablet 10 mg  10 mg Oral Daily Clovis Fredrickson, MD  10 mg at 08/31/15 0836  . atenolol (TENORMIN) tablet 75 mg  75 mg Oral BID Clovis Fredrickson, MD   75 mg at 08/31/15 0835  . benzocaine (ORAJEL) 10 % mucosal gel   Mouth/Throat TID AC & HS Hildred Priest, MD      . cloNIDine (CATAPRES) tablet 0.3 mg  0.3 mg Oral BID Clovis Fredrickson, MD   0.3 mg at 08/31/15 0837  . glimepiride (AMARYL) tablet 4 mg  4 mg Oral Q breakfast Clovis Fredrickson, MD   4 mg at 08/31/15 0838  . hydrALAZINE (APRESOLINE) tablet 100 mg  100 mg Oral 3 times per day Gladstone Lighter, MD      . ibuprofen (ADVIL,MOTRIN) tablet 600 mg  600 mg Oral Q6H PRN Hildred Priest, MD      . insulin aspart (novoLOG) injection 0-15 Units  0-15 Units Subcutaneous TID WC Max Sane, MD   2 Units at 08/31/15 1222  . insulin aspart (novoLOG) injection 0-5 Units  0-5 Units Subcutaneous QHS Max Sane, MD   0 Units at 08/30/15 2338  . lisinopril  (PRINIVIL,ZESTRIL) tablet 20 mg  20 mg Oral Daily Hildred Priest, MD   20 mg at 08/31/15 0835  . magnesium hydroxide (MILK OF MAGNESIA) suspension 30 mL  30 mL Oral Daily PRN Jolanta B Pucilowska, MD      . metFORMIN (GLUCOPHAGE) tablet 1,000 mg  1,000 mg Oral BID WC Jolanta B Pucilowska, MD   1,000 mg at 08/31/15 0833  . naltrexone (DEPADE) tablet 50 mg  50 mg Oral Daily Jolanta B Pucilowska, MD   50 mg at 08/31/15 0838  . nicotine (NICODERM CQ - dosed in mg/24 hours) patch 21 mg  21 mg Transdermal Q0600 Clovis Fredrickson, MD   21 mg at 08/30/15 0640  . pantoprazole (PROTONIX) EC tablet 40 mg  40 mg Oral Daily Clovis Fredrickson, MD   40 mg at 08/31/15 0834  . simvastatin (ZOCOR) tablet 20 mg  20 mg Oral q1800 Jolanta B Pucilowska, MD   20 mg at 08/30/15 1651  . traZODone (DESYREL) tablet 150 mg  150 mg Oral QHS PRN Clovis Fredrickson, MD        Lab Results:  Results for orders placed or performed during the hospital encounter of 08/29/15 (from the past 48 hour(s))  Glucose, capillary     Status: Abnormal   Collection Time: 08/29/15  5:24 PM  Result Value Ref Range   Glucose-Capillary 233 (H) 65 - 99 mg/dL   Comment 1 Notify RN   CBC     Status: Abnormal   Collection Time: 08/29/15  6:11 PM  Result Value Ref Range   WBC 4.3 3.8 - 10.6 K/uL   RBC 4.54 4.40 - 5.90 MIL/uL   Hemoglobin 13.6 13.0 - 18.0 g/dL   HCT 41.8 40.0 - 52.0 %   MCV 92.1 80.0 - 100.0 fL   MCH 29.9 26.0 - 34.0 pg   MCHC 32.5 32.0 - 36.0 g/dL   RDW 13.9 11.5 - 14.5 %   Platelets 100 (L) 150 - 440 K/uL  Comprehensive metabolic panel     Status: Abnormal   Collection Time: 08/29/15  6:11 PM  Result Value Ref Range   Sodium 137 135 - 145 mmol/L   Potassium 4.0 3.5 - 5.1 mmol/L   Chloride 103 101 - 111 mmol/L   CO2 25 22 - 32 mmol/L   Glucose, Bld 243 (H) 65 - 99 mg/dL   BUN  13 6 - 20 mg/dL   Creatinine, Ser 0.88 0.61 - 1.24 mg/dL   Calcium 9.1 8.9 - 10.3 mg/dL   Total Protein 7.2 6.5 - 8.1 g/dL    Albumin 3.8 3.5 - 5.0 g/dL   AST 127 (H) 15 - 41 U/L   ALT 50 17 - 63 U/L   Alkaline Phosphatase 61 38 - 126 U/L   Total Bilirubin 0.2 (L) 0.3 - 1.2 mg/dL   GFR calc non Af Amer >60 >60 mL/min   GFR calc Af Amer >60 >60 mL/min    Comment: (NOTE) The eGFR has been calculated using the CKD EPI equation. This calculation has not been validated in all clinical situations. eGFR's persistently <60 mL/min signify possible Chronic Kidney Disease.    Anion gap 9 5 - 15  Hemoglobin A1c     Status: Abnormal   Collection Time: 08/29/15  6:11 PM  Result Value Ref Range   Hgb A1c MFr Bld 7.5 (H) 4.0 - 6.0 %  Lipid panel, fasting     Status: Abnormal   Collection Time: 08/29/15  6:11 PM  Result Value Ref Range   Cholesterol 120 0 - 200 mg/dL   Triglycerides 339 (H) <150 mg/dL   HDL 37 (L) >40 mg/dL   Total CHOL/HDL Ratio 3.2 RATIO   VLDL 68 (H) 0 - 40 mg/dL   LDL Cholesterol 15 0 - 99 mg/dL    Comment:        Total Cholesterol/HDL:CHD Risk Coronary Heart Disease Risk Table                     Men   Women  1/2 Average Risk   3.4   3.3  Average Risk       5.0   4.4  2 X Average Risk   9.6   7.1  3 X Average Risk  23.4   11.0        Use the calculated Patient Ratio above and the CHD Risk Table to determine the patient's CHD Risk.        ATP III CLASSIFICATION (LDL):  <100     mg/dL   Optimal  100-129  mg/dL   Near or Above                    Optimal  130-159  mg/dL   Borderline  160-189  mg/dL   High  >190     mg/dL   Very High   TSH     Status: None   Collection Time: 08/29/15  6:11 PM  Result Value Ref Range   TSH 2.190 0.350 - 4.500 uIU/mL  Glucose, capillary     Status: Abnormal   Collection Time: 08/29/15  8:13 PM  Result Value Ref Range   Glucose-Capillary 231 (H) 65 - 99 mg/dL  Glucose, capillary     Status: Abnormal   Collection Time: 08/30/15  6:40 AM  Result Value Ref Range   Glucose-Capillary 209 (H) 65 - 99 mg/dL   Comment 1 Notify RN    Comment 2 Document in  Chart   Urinalysis complete, with microscopic ([facility] only)     Status: Abnormal   Collection Time: 08/30/15 12:18 PM  Result Value Ref Range   Color, Urine YELLOW (A) YELLOW   APPearance CLEAR (A) CLEAR   Glucose, UA 50 (A) NEGATIVE mg/dL   Bilirubin Urine NEGATIVE NEGATIVE   Ketones, ur NEGATIVE NEGATIVE mg/dL   Specific Gravity, Urine  1.011 1.005 - 1.030   Hgb urine dipstick NEGATIVE NEGATIVE   pH 5.0 5.0 - 8.0   Protein, ur NEGATIVE NEGATIVE mg/dL   Nitrite NEGATIVE NEGATIVE   Leukocytes, UA NEGATIVE NEGATIVE   RBC / HPF NONE SEEN 0 - 5 RBC/hpf   WBC, UA 0-5 0 - 5 WBC/hpf   Bacteria, UA NONE SEEN NONE SEEN   Squamous Epithelial / LPF 0-5 (A) NONE SEEN  Glucose, capillary     Status: Abnormal   Collection Time: 08/30/15 12:52 PM  Result Value Ref Range   Glucose-Capillary 174 (H) 65 - 99 mg/dL  Hemoglobin A1c     Status: Abnormal   Collection Time: 08/30/15  3:01 PM  Result Value Ref Range   Hgb A1c MFr Bld 7.6 (H) 4.0 - 6.0 %  Glucose, capillary     Status: Abnormal   Collection Time: 08/30/15  4:50 PM  Result Value Ref Range   Glucose-Capillary 166 (H) 65 - 99 mg/dL  Glucose, capillary     Status: Abnormal   Collection Time: 08/30/15  8:41 PM  Result Value Ref Range   Glucose-Capillary 140 (H) 65 - 99 mg/dL   Comment 1 Notify RN    Comment 2 Document in Chart   Glucose, capillary     Status: Abnormal   Collection Time: 08/31/15  6:45 AM  Result Value Ref Range   Glucose-Capillary 198 (H) 65 - 99 mg/dL   Comment 1 Notify RN   Glucose, capillary     Status: Abnormal   Collection Time: 08/31/15 12:18 PM  Result Value Ref Range   Glucose-Capillary 147 (H) 65 - 99 mg/dL    Physical Findings: AIMS: Facial and Oral Movements Muscles of Facial Expression: None, normal Lips and Perioral Area: None, normal Jaw: None, normal Tongue: None, normal,Extremity Movements Upper (arms, wrists, hands, fingers): None, normal Lower (legs, knees, ankles, toes): None, normal, Trunk  Movements Neck, shoulders, hips: None, normal, Overall Severity Severity of abnormal movements (highest score from questions above): None, normal Incapacitation due to abnormal movements: None, normal Patient's awareness of abnormal movements (rate only patient's report): No Awareness, Dental Status Current problems with teeth and/or dentures?: No Does patient usually wear dentures?: No  CIWA:  CIWA-Ar Total: 1 COWS:  COWS Total Score: 1  Musculoskeletal: Strength & Muscle Tone: within normal limits Gait & Station: normal Patient leans: N/A  Psychiatric Specialty Exam: Review of Systems  Constitutional: Positive for malaise/fatigue. Negative for fever, chills, weight loss and diaphoresis.  HENT: Negative.   Eyes: Negative.   Respiratory: Negative.   Cardiovascular: Negative.   Gastrointestinal: Negative.   Genitourinary: Negative.   Musculoskeletal: Negative.   Skin: Negative.   Neurological: Positive for weakness. Negative for dizziness, tingling, tremors, sensory change, speech change, focal weakness, seizures and loss of consciousness.  Endo/Heme/Allergies: Negative.   Psychiatric/Behavioral: Positive for depression and substance abuse. Negative for suicidal ideas, hallucinations and memory loss. The patient is not nervous/anxious and does not have insomnia.     Blood pressure 151/99, pulse 62, temperature 98.2 F (36.8 C), temperature source Oral, resp. rate 20, height _0  (1.803 m), weight 94.348 kg (208 lb), SpO2 100 %.Body mass index is 29.02 kg/(m^2).  General Appearance: Disheveled  Eye Contact::  Minimal  Speech:  Slurred  Volume:  Decreased  Mood:  Dysphoric  Affect:  Constricted  Thought Process:  Logical  Orientation:  Full (Time, Place, and Person)  Thought Content:  Hallucinations: None  Suicidal Thoughts:  No  Homicidal  Thoughts:  No  Memory:  Immediate;   Fair Recent;   Fair Remote;   Fair  Judgement:  Fair  Insight:  Fair  Psychomotor Activity:   Decreased  Concentration:  Fair  Recall:  NA  Fund of Knowledge:Fair  Language: Fair  Akathisia:  No  Handed:    AIMS (if indicated):     Assets:  Financial Resources/Insurance  ADL's:  Intact  Cognition: WNL  Sleep:  Number of Hours: 6   Treatment Plan Summary: Daily contact with patient to assess and evaluate symptoms and progress in treatment and Medication management   54 year old male with a history of alcohol and cocaine dependence and a questionable history of schizoaffective disorder. The patient presented to the emergency department in [place] after he overdosed on 3 weeks worth of medication. This patient has had a multitude of visits and hospitalizations in [place] due to poorly controlled hypertension, substance abuse and withdrawals. Clearly this patient is a struggling with maintaining sobriety and will benefit from long-term inpatient substance abuse treatment.  Schizoaffective disorder versus alcohol-induced psychotic disorder: Prior to admission appears patient was prescribed with Abilify 5 low-dose mainly for hyperprolactinemia and Invega sustained at 117 mg per month. Patient also was receiving act team [facility] to [facility]. At this time the diagnosis of schizoaffective disorder is questionable and the need for antipsychotics is unclear. Will not restart invega or abilify.  No evidence of paranoia, delusional thinking, or hallucinations  Alcohol dependence: Continue naltrexone 50 mg a day.  Alcohol withdrawal: As patient appears overly sedated I will discontinue Librium.  Hypertension: Blood pressure much improved this morning . The patient is currently on Norvasc 10 mg by mouth twice a day, atenolol 75 mg by mouth twice a day, clonidine 0.3 mg by mouth twice a day, lisinopril 20 mg,  and hydralazine 25 mg q 8 h. every 12 hours. Hospitalist is following up this patient in assisting with blood pressure management.  Diabetes: Patient will be continued on metformin  1000 mg by mouth twice a day a.m. Amaryl 4 mg by mouth daily. Patient will receive a card-controlled diet. Capillary blood sugar will be check 4 times a day. He will receive supplemental insulin. No changes  Hypercholesterolemia:  continued on simvastatin 20 mg by mouth daily  Tobacco use disorder: Continue nicotine patch 21 mg a day  Precautions every 15 minute checks along with withdrawal precautions  Diet: Carb modified a low sodium.  Vital signs will be check every 8 hours  Discharge disposition: I will discuss case with social worker this patient will benefit from inpatient substance abuse treatment as he is struggling to maintain sobriety and has been in the hospital a multitude of times so far this year.  Hildred Priest 08/31/2015, 1:50 PM

## 2015-08-31 NOTE — Progress Notes (Signed)
Recreation Therapy Notes  Date: 10.13.16 Time: 3:00 pm Location: Craft Room  Group Topic: Leisure Education   Goal Area(s) Addresses:  Patient will identify activities for each letter of the alphabet. Patient will verbalize ability to integrate positive leisure into life post d/c. Patient will verbalize ability to use leisure as a Technical sales engineer.  Behavioral Response: Did not attend   Intervention: Leisure Alphabet  Activity: Patients were given a leisure alphabet worksheet and instructed to write a healthy leisure activity for each letter of the alphabet.  Education: LRT educated patients on what they needed to participate in leisure.  Education Outcome: Patient did not attend group.   Clinical Observations/Feedback: Patient did not attend group.  [name], LRT/CTRS 08/31/2015 4:39 PM

## 2015-08-31 NOTE — Plan of Care (Signed)
Problem: Diagnosis: Increased Risk For Suicide Attempt Goal: STG-Patient Will Report Suicidal Feelings to Staff Outcome: Progressing Patient denies SI/HI.

## 2015-08-31 NOTE — BHH Group Notes (Signed)
[facility] Group Notes:  (Nursing/MHT/Case Management/Adjunct)  Date:  08/31/2015  Time:  2:32 PM  Type of Therapy:  Group Therapy  Participation Level:  Did Not Attend  Participation Qualit Summary of Progress/Problems:  [name] 08/31/2015, 2:32 PM

## 2015-08-31 NOTE — Progress Notes (Signed)
D: Pt denies SI/HI/AVH. Pt is pleasant and cooperative,affect is flat and sad, does not initiate conversation. Pt is interacting with peers and staff appropriately.  A: Pt was offered support and encouragement. Pt was given scheduled medications. Pt was encouraged to attend groups. Q 15 minute checks were done for safety.  R:Pt did not attend evening group. Pt is complaints with medication. Pt receptive to treatment and safety maintained on unit.

## 2015-08-31 NOTE — Progress Notes (Signed)
[facility] NAME: [name]    MR#:  [id number]  DATE OF BIRTH:  [birth year]  SUBJECTIVE:  CHIEF COMPLAINT:  No chief complaint on file.  -Feels okay today. Complaints of weakness and being tired. -No acute alcohol withdrawal symptoms. Denies any other complaints.  REVIEW OF SYSTEMS:  Review of Systems  Constitutional: Negative for fever and chills.  HENT: Negative for ear discharge, ear pain and nosebleeds.   Eyes: Negative for blurred vision and double vision.  Respiratory: Negative for cough, shortness of breath and wheezing.   Cardiovascular: Negative for chest pain, palpitations and leg swelling.  Gastrointestinal: Negative for nausea, vomiting, abdominal pain, diarrhea and constipation.  Genitourinary: Negative for dysuria and hematuria.  Musculoskeletal: Negative for myalgias.  Neurological: Positive for weakness. Negative for dizziness, sensory change, speech change, focal weakness, seizures and headaches.  Psychiatric/Behavioral: Positive for depression.    DRUG ALLERGIES:  No Known Allergies  VITALS:  Blood pressure 151/99, pulse 62, temperature 98.2 F (36.8 C), temperature source Oral, resp. rate 20, height 5\' 11"  (1.803 m), weight 94.348 kg (208 lb), SpO2 100 %.  PHYSICAL EXAMINATION:  Physical Exam  GENERAL:  54 y.o.-year-old patient lying in the bed with no acute distress.  EYES: Pupils equal, round, reactive to light and accommodation. No scleral icterus. Extraocular muscles intact.  HEENT: Head atraumatic, normocephalic. Oropharynx and nasopharynx clear.  NECK:  Supple, no jugular venous distention. No thyroid enlargement, no tenderness.  LUNGS: Normal breath sounds bilaterally, no wheezing, rales,rhonchi or crepitation. No use of accessory muscles of respiration.  CARDIOVASCULAR: S1, S2 normal. No murmurs, rubs, or gallops.  ABDOMEN: Soft, nontender, nondistended. Bowel sounds present.  No organomegaly or mass.  EXTREMITIES: No pedal edema, cyanosis, or clubbing.  NEUROLOGIC: Cranial nerves II through XII are intact. Muscle strength 5/5 in all extremities. Sensation intact. Gait not checked.  PSYCHIATRIC: The patient is alert and oriented x 3.  SKIN: No obvious rash, lesion, or ulcer.    LABORATORY PANEL:   CBC  Recent Labs Lab 08/29/15 1811  WBC 4.3  HGB 13.6  HCT 41.8  PLT 100*   ------------------------------------------------------------------------------------------------------------------  Chemistries   Recent Labs Lab 08/29/15 1811  NA 137  K 4.0  CL 103  CO2 25  GLUCOSE 243*  BUN 13  CREATININE 0.88  CALCIUM 9.1  AST 127*  ALT 50  ALKPHOS 61  BILITOT 0.2*   ------------------------------------------------------------------------------------------------------------------  Cardiac Enzymes  Recent Labs Lab 08/27/15 1237  TROPONINI <0.03   ------------------------------------------------------------------------------------------------------------------  RADIOLOGY:  No results found.  EKG:   Orders placed or performed during the hospital encounter of 08/26/15  . EKG 12-Lead  . EKG 12-Lead  . EKG 12-Lead  . EKG 12-Lead  . EKG    ASSESSMENT AND PLAN:   54 year old male with history of hypertension, diabetes and hyperlipidemia and depression with schizoaffective disorder admitted to behavioral medicine unit for depression. Medical consult requested for management of his blood pressure and also diabetes mellitus.  #1 Uncontrolled hypertension- continue atenolol, amlodipine, clonidine and lisinopril. -Increased oral hydralazine dose.  #2 diabetes mellitus-HbA1c is 7.6. -Continue Amaryl and metformin and sliding scale insulin for now. -Appreciate diabetic nurse coordinator's input  #3 hyperlipidemia-on statin  #4 tobacco use disorder-on nicotine patch.  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 Lorayne Bender and Abilify are on hold -Naltrexone daily started for alcohol dependence -Currently patient is on trazodone at bedtime,   All the  records are reviewed and case discussed with Care Management/Social Workerr. Management plans discussed with the patient, family and they are in agreement.  CODE STATUS: Full Code  TOTAL TIME TAKING CARE OF THIS PATIENT: 37 minutes.    Gladstone Lighter M.D on 08/31/2015 at 3:39 PM  Between 7am to 6pm - Pager - [phone]  After 6pm go to [email or web address] - password [email or web address] [facility]  Office  [phone]  CC: Primary care physician; [place]

## 2015-08-31 NOTE — Plan of Care (Signed)
Problem: Alteration in mood & ability to function due to Goal: LTG-Pt reports reduction in suicidal thoughts (Patient reports reduction in suicidal thoughts and is able to verbalize a safety plan for whenever patient is feeling suicidal)  Outcome: Progressing Patient denies SI/HI.

## 2015-08-31 NOTE — Progress Notes (Signed)
Inpatient Diabetes Program Recommendations  AACE/ADA: New Consensus Statement on Inpatient Glycemic Control (2015)  Target Ranges:  Prepandial:   less than 140 mg/dL      Peak postprandial:   less than 180 mg/dL (1-2 hours)      Critically ill patients:  140 - 180 mg/dL   Results for MARQUESE, RISING (MRN [id number]) as of 08/31/2015 07:43  Ref. Range 08/30/2015 06:40 08/30/2015 12:52 08/30/2015 16:50 08/30/2015 20:41 08/31/2015 06:45  Glucose-Capillary Latest Ref Range: 65-99 mg/dL 209 (H) 174 (H) 166 (H) 140 (H) 198 (H)   Review of Glycemic Control  Diabetes history: DM2 Outpatient Diabetes medications: Amaryl 4 mg QAM, Metformin 1000 mg BID Current orders for Inpatient glycemic control: Amaryl 4 mg QAM, Metformin 1000 mg BID, Novolog 0-15 units TID with meals, Novolog 0-5 units HS  Inpatient Diabetes Program Recommendations:   Note: Consult for diabetes coordinator noted and chart reviewed. Noted that Metformin and Amaryl were restarted and patient received both yesterday along with Novolog correction. Agree with current inpatient orders for glycemic control. Will continue to follow and make further recommendations if needed as more data is collected.  Thanks, Barnie Alderman, RN, MSN, CCRN, CDE Diabetes Coordinator Inpatient Diabetes Program [phone] (Team Pager from [place] to [place]) [phone] ([facility] office) [phone] [facility] office) [phone] [facility] office)

## 2015-08-31 NOTE — Plan of Care (Signed)
Problem: [facility] Participation in Recreation Therapeutic Interventions Goal: STG-Patient will demonstrate improved self esteem by identif STG: Self-Esteem - Within 4 treatment sessions, patient will verbalize at least 5 positive affirmation statements in each of 2 treatment sessions to increase self-esteem post d/c.  Outcome: Progressing Treatment Session 1; Completed 1 out of 2: At approximately 9:35 am, LRT met with patient in patient room. Patient verbalized 5 positive affirmation statements. Patient reported it felt "good". LRT encouraged patient to continue saying positive affirmation statements.  [name], LRT/CTRS 10.13.16 5:03 pm Goal: STG-Patient will identify at least five coping skills for ** STG: Coping Skills - Within 4 treatment sessions, patient will verbalize at least 5 coping skills for substance abuse in each of 2 treatment sessions to decrease substance abuse post d/c.  Outcome: Progressing Treatment Session 1; Completed 1 out of 2: At approximately 9:35 am, LRT met with patient in patient room. Patient verbalized 5 coping skills for substance abuse. LRT educated patient on leisure and why it is important to implement into his schedule. LRT educated and provided patient with blank schedules to help him plan his day and try to avoid using substances.  [name], LRT/CTRS 10.13.16 5:04 pm Goal: STG-Other Recreation Therapy Goal (Specify) STG: Stress Management - Within 4 treatment sessions, patient will verbalize understanding of the stress management techniques in each of 2 treatment sessions to increase stress management skills post d/c.  Outcome: Progressing Treatment Session 1; Completed 1 out of 2: At approximately 9:35 am, LRT met with patient in patient room. LRT educated and provided patient with handouts on stress management techniques. Patient verbalized understanding. LRT encouraged patient to read over and practice the stress management  techniques.  [name], LRT/CTRS 10.13.16 5:05 pm

## 2015-08-31 NOTE — Progress Notes (Deleted)
Patient 54 year old male admitted to the unit from the emergency department Pt was brought into the hospital because he had several months of weight loss related to refusal to eat per documentation. Pt is a poor historian. Pt is confused with disorganized speech. Pt requires assist with ADL's. Unsteady gait. Dx schizoaffective d/o, MR, ADD, Depression and Anxiety. Pt is anxious but cooperative. Denies SI/HI/AV/H. Patient was searched for contraband non found, skin check done with another nurse; skin warm, dry and intact. No c/o pain/discomfort noted.

## 2015-08-31 NOTE — Progress Notes (Signed)
D: Pt denies SI/HI/AVH. Pt is pleasant and cooperative. Pt stated he feels better from sleeping, he appears less anxious and he is interacting with peers and staff appropriately.  A: Pt was offered support and encouragement. Pt was given scheduled medications. Pt was encouraged to attend groups. Q 15 minute checks were done for safety.  R: Pt did not attend group and is  not interacting with peers . Pt is taking medication. Pt has no complaints.Pt receptive to treatment and safety maintained on unit.

## 2015-09-01 LAB — CULTURE, BLOOD (ROUTINE X 2)
Culture: NO GROWTH
Culture: NO GROWTH

## 2015-09-01 LAB — GLUCOSE, CAPILLARY
Glucose-Capillary: 152 mg/dL — ABNORMAL HIGH (ref 65–99)
Glucose-Capillary: 90 mg/dL (ref 65–99)
Glucose-Capillary: 81 mg/dL (ref 65–99)
Glucose-Capillary: 100 mg/dL — ABNORMAL HIGH (ref 65–99)

## 2015-09-01 MED ORDER — TRAZODONE HCL 100 MG PO TABS: 100.0000 mg | ORAL_TABLET | Freq: Every evening | ORAL | Status: DC | PRN

## 2015-09-01 MED ORDER — ONDANSETRON HCL 4 MG PO TABS
4.0000 mg | ORAL_TABLET | Freq: Three times a day (TID) | ORAL | Status: DC | PRN
Start: 2015-09-01 — End: 2015-09-03
  Administered 2015-09-01 – 2015-09-03 (×2): 4 mg via ORAL
  Filled 2015-09-01 (×2): qty 1

## 2015-09-01 NOTE — Tx Team (Signed)
Interdisciplinary Treatment Plan Update (Adult)  Date:  09/01/2015 Time Reviewed:  11:56 AM  Progress in Treatment: Attending groups: No. Participating in groups:  No. Taking medication as prescribed:  Yes. Tolerating medication:  Yes. Family/Significant othe contact made:  No, will contact:  if patient provides consent Patient understands diagnosis:  Yes. Discussing patient identified problems/goals with staff:  Yes. Medical problems stabilized or resolved:  Yes. Denies suicidal/homicidal ideation: Yes. Issues/concerns per patient self-inventory:  No. Other:  New problem(s) identified: No, Describe:  none reported  Discharge Plan or Barriers: Patient had a breakup with his girlfriend after an argument and is now homeless and seeking ALF placement as patient was released for substance abuse inpatient txt at [facility] in [place] due to his chronic high blood pressure. Patient will stabilize on meds to address his depression, SI, HI and hallucinations and will need housing at discharge but is connected with [name].   Reason for Continuation of Hospitalization: Depression Hallucinations Homicidal ideation Suicidal ideation  Comments:  Estimated length of stay: up to 6 days with expected discharge by Wednesday 09/06/15  New goal(s):  Review of initial/current patient goals per problem list:   1.  Goal(s): participate in care plan  Met:  Yes  2.  Goal (s): eliminate SI/HI  Met:  Yes  3.  Goal(s):hallucinations management by discharge  Met:  Yes  4.  Goal(s): decrease depression  Met:  No  Target date: 09/06/15  As evidenced [id number]'S self report  Attendees: Physician:  Merlyn Albert, MD 10/14/201611:56 AM  Nursing:   Carolynn Sayers, RN 10/14/201611:56 AM  Other:  Carmell Austria, [place] 10/14/201611:56 AM  Other:   10/14/201611:56 AM  Other:   10/14/201611:56 AM  Other:  10/14/201611:56 AM  Other:  10/14/201611:56 AM  Other:  10/14/201611:56 AM  Other:   10/14/201611:56 AM  Other:  10/14/201611:56 AM  Other:  10/14/201611:56 AM  Other:   10/14/201611:56 AM   Scribe for Treatment Team:   Keene Breath, MSW, [place]  09/01/2015, 11:56 AM

## 2015-09-01 NOTE — Clinical Social Work Note (Signed)
CSW applied for [facility] by sending in FL2 and PAS[facility] Form on Provider Link and faxing in H&P, Progress NOTE, MAR, and consent form and will seek placement for patient early next week hopefull by the time the [facility] number is received.

## 2015-09-01 NOTE — Progress Notes (Signed)
[facility] MD Progress Note  09/01/2015 9:46 AM [name]  MRN:  [id number] Subjective:  Patient is still secluded to his room. He says he feels very tired and sedated which he attributes to taking out the blood pressure medications. The patient is not receiving any other agents that could be sedating. Patient started to attend groups.  He also has been getting up for medications and for meals.  Patient denies major issues with depression, he does complain of poor appetite, poor energy, and poor concentration. Patient has been is sleeping well at night but feels overly sedated in the daytime. He denies SI, HI or auditory or visual hallucinations. There is no evidence of delusional thinking or any other psychotic symptoms. Patient has been complaining of nausea.  Per nursing: D: Pt denies SI/HI/AVH. Pt is pleasant and cooperative. Pt stated he feels better from sleeping, he appears less anxious and he is interacting with peers and staff appropriately.  A: Pt was offered support and encouragement. Pt was given scheduled medications. Pt was encouraged to attend groups. Q 15 minute checks were done for safety.  R: Pt did not attend group and is not interacting with peers . Pt is taking medication. Pt has no complaints.Pt receptive to treatment and safety maintained on unit.  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 Malignant hypertension [I10] 08/30/2015  . Alcohol use disorder, severe, dependence ([place]) [F10.20] 08/30/2015  . Alcohol withdrawal ([place]) [F10.239] 08/30/2015  . 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 Thrombocytopenia ([place]) [D69.6] 08/27/2015  . Tobacco use disorder [F17.200] 08/26/2015  . 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  Hyperlipidemia [E78.5] 04/28/2015  . Cocaine use disorder, severe, dependence ([place]) [F14.20] 04/27/2015  . Gout [M10.9] 08/09/2013  . Diabetes mellitus ([place]) [E11.9] 07/09/2012   Total Time spent with patient: 30 minutes  Past Psychiatric History: Patient has been diagnosed with schizoaffective disorder, polysubstance abuse and has been receiving outpatient medication management from [facility] and also follow with the ACT team.  Patient reports multiple psychiatric hospitalizations mainly at behavioral health in [place] but has also been hospitalized in [facility]. The patient reports a multitude of suicidal attempts throughout his life mainly by overdose.  Risk to Self: Is patient at risk for suicide?: No Risk to Others:  no Prior Inpatient Therapy:  yes Prior Outpatient Therapy:  yes  Alcohol Screening: 1. How often do you have a drink containing alcohol?: 4 or more times a week 2. How many drinks containing alcohol do you have on a typical day when you are drinking?: 3 or 4 3. How often do you have six or more drinks on one occasion?: Weekly Preliminary Score: 4 4. How often during the last year have you found that you were not able to stop drinking once you had started?: Never 5. How often during the last year have you failed to do what was normally expected from you becasue of drinking?: Never 6. How often during the last year have you needed a first drink in the morning to get yourself going after a heavy drinking session?: Never 7. How often during the last year have you had a feeling of guilt of remorse after drinking?: Never 8. How often during the last year have you been unable to remember what happened the night before because you had  been drinking?: Never 9. Have you or someone else been injured as a result of your drinking?: No 10. Has a relative or friend or a doctor or another health worker been concerned about your drinking or suggested you cut down?: No Alcohol  Use Disorder Identification Test Final Score (AUDIT): 8 Brief Intervention: Patient declined brief intervention Substance Abuse History in the last 12 months: Yes.  Consequences of Substance Abuse: Medical Consequences: poor compliance, worsening mood. Previous Psychotropic Medications: Yes  Psychological Evaluations: No   Past Medical History: Patient follows up at [facility] in [place]. Past Medical History  Diagnosis Date  . Drug abuse   . Alcohol abuse   . Diabetes mellitus   . Uncontrolled hypertension   . Arthritis   . Gout   . DTs (delirium tremens) ([place])     history of   . Crack cocaine use   . Noncompliance with medication regimen   . Depression   . Schizophrenia ([place])   . Stroke [facility])     Past Surgical History  Procedure Laterality Date  . Mandible reconstruction     Family History:  Family History  Problem Relation Age of Onset  . Hypertension     Family Psychiatric History: Patient reports that his mother was "mental". Denies history of suicidal attempts in his family. Denies history of substance abuse.  Social History: Patient reports he has been receiving disability for the last 8 years for mental health issues. He is currently separated from his wife. He has 7 children were grown. He has on and off contact with them. For the last 3 years he has been living with his girlfriend but is states he does not want to return and live with her. She pressed charges on him a few months ago and he has a court hearing on October 20 for assault on a male. As far as his legal history patient states he's been in prison 3 times. His last hospitalization and longest was from 1990-1994. The patient does not want to disclose what his charges were but denies being a registered sex offender. He has a daughter who is 43 years old lives in [place] and is hoping he can go and stay with her.  History  Alcohol Use   . 0.6 - 1.2 oz/week  . 1-2 Cans of beer per week    Comment: [facility]    History  Drug Use  . Yes  . Special: "Crack" cocaine, Cocaine    Social History   Social History  . Marital Status: Legally Separated    Spouse Name: N/A  . Number of Children: N/A  . Years of Education: N/A   Social History Main Topics  . Smoking status: Current Every Day Smoker -- 0.25 packs/day    Types: Cigarettes  . Smokeless tobacco: Never Used  . Alcohol Use: 0.6 - 1.2 oz/week    1-2 Cans of beer per week     Comment: [facility]  . Drug Use: Yes    Special: "Crack" cocaine, Cocaine  . Sexual Activity: Not Asked   Other Topics Concern  . None   Social History Narrative        Sleep: Good  Appetite:  Good  Current Medications: Current Facility-Administered Medications  Medication Dose Route Frequency Provider Last Rate Last Dose  . acetaminophen (TYLENOL) tablet 650 mg  650 mg Oral Q6H PRN Clovis Fredrickson, MD   650 mg at 08/30/15 0948  . alum & mag hydroxide-simeth (  MAALOX/MYLANTA) 200-200-20 MG/5ML suspension 30 mL  30 mL Oral Q4H PRN Jolanta B Pucilowska, MD      . amLODipine (NORVASC) tablet 10 mg  10 mg Oral Daily Jolanta B Pucilowska, MD   10 mg at 08/31/15 0836  . atenolol (TENORMIN) tablet 75 mg  75 mg Oral BID Clovis Fredrickson, MD   75 mg at 08/31/15 2144  . benzocaine (ORAJEL) 10 % mucosal gel   Mouth/Throat TID AC & HS Hildred Priest, MD      . cloNIDine (CATAPRES) tablet 0.3 mg  0.3 mg Oral BID Clovis Fredrickson, MD   0.3 mg at 08/31/15 2144  . glimepiride (AMARYL) tablet 4 mg  4 mg Oral Q breakfast Jolanta B Pucilowska, MD   4 mg at 09/01/15 0805  . hydrALAZINE (APRESOLINE) tablet 100 mg  100 mg Oral 3 times per day Gladstone Lighter, MD   100 mg at 08/31/15 2144  . ibuprofen (ADVIL,MOTRIN) tablet 600 mg  600 mg Oral Q6H PRN Hildred Priest, MD      . insulin  aspart (novoLOG) injection 0-15 Units  0-15 Units Subcutaneous TID WC Max Sane, MD   3 Units at 09/01/15 0806  . insulin aspart (novoLOG) injection 0-5 Units  0-5 Units Subcutaneous QHS Max Sane, MD   0 Units at 08/30/15 2338  . lisinopril (PRINIVIL,ZESTRIL) tablet 20 mg  20 mg Oral Daily Hildred Priest, MD   20 mg at 08/31/15 0835  . magnesium hydroxide (MILK OF MAGNESIA) suspension 30 mL  30 mL Oral Daily PRN Jolanta B Pucilowska, MD      . metFORMIN (GLUCOPHAGE) tablet 1,000 mg  1,000 mg Oral BID WC Jolanta B Pucilowska, MD   1,000 mg at 09/01/15 0804  . naltrexone (DEPADE) tablet 50 mg  50 mg Oral Daily Jolanta B Pucilowska, MD   50 mg at 08/31/15 0838  . nicotine (NICODERM CQ - dosed in mg/24 hours) patch 21 mg  21 mg Transdermal Q0600 Clovis Fredrickson, MD   21 mg at 08/30/15 0640  . pantoprazole (PROTONIX) EC tablet 40 mg  40 mg Oral Daily Clovis Fredrickson, MD   40 mg at 08/31/15 0834  . simvastatin (ZOCOR) tablet 20 mg  20 mg Oral q1800 Jolanta B Pucilowska, MD   20 mg at 08/31/15 1732  . traZODone (DESYREL) tablet 150 mg  150 mg Oral QHS PRN Clovis Fredrickson, MD      . tuberculin injection 5 Units  5 Units Intradermal Once Hildred Priest, MD   5 Units at 08/31/15 1839    Lab Results:  Results for orders placed or performed during the hospital encounter of 08/29/15 (from the past 48 hour(s))  Urinalysis complete, with microscopic ([facility] only)     Status: Abnormal   Collection Time: 08/30/15 12:18 PM  Result Value Ref Range   Color, Urine YELLOW (A) YELLOW   APPearance CLEAR (A) CLEAR   Glucose, UA 50 (A) NEGATIVE mg/dL   Bilirubin Urine NEGATIVE NEGATIVE   Ketones, ur NEGATIVE NEGATIVE mg/dL   Specific Gravity, Urine 1.011 1.005 - 1.030   Hgb urine dipstick NEGATIVE NEGATIVE   pH 5.0 5.0 - 8.0   Protein, ur NEGATIVE NEGATIVE mg/dL   Nitrite NEGATIVE NEGATIVE   Leukocytes, UA NEGATIVE NEGATIVE   RBC / HPF NONE SEEN 0 - 5 RBC/hpf   WBC, UA 0-5 0  - 5 WBC/hpf   Bacteria, UA NONE SEEN NONE SEEN   Squamous Epithelial / LPF 0-5 (A) NONE  SEEN  Glucose, capillary     Status: Abnormal   Collection Time: 08/30/15 12:52 PM  Result Value Ref Range   Glucose-Capillary 174 (H) 65 - 99 mg/dL  Hemoglobin A1c     Status: Abnormal   Collection Time: 08/30/15  3:01 PM  Result Value Ref Range   Hgb A1c MFr Bld 7.6 (H) 4.0 - 6.0 %  Glucose, capillary     Status: Abnormal   Collection Time: 08/30/15  4:50 PM  Result Value Ref Range   Glucose-Capillary 166 (H) 65 - 99 mg/dL  Glucose, capillary     Status: Abnormal   Collection Time: 08/30/15  8:41 PM  Result Value Ref Range   Glucose-Capillary 140 (H) 65 - 99 mg/dL   Comment 1 Notify RN    Comment 2 Document in Chart   Glucose, capillary     Status: Abnormal   Collection Time: 08/31/15  6:45 AM  Result Value Ref Range   Glucose-Capillary 198 (H) 65 - 99 mg/dL   Comment 1 Notify RN   Glucose, capillary     Status: Abnormal   Collection Time: 08/31/15 12:18 PM  Result Value Ref Range   Glucose-Capillary 147 (H) 65 - 99 mg/dL  Glucose, capillary     Status: Abnormal   Collection Time: 08/31/15  4:49 PM  Result Value Ref Range   Glucose-Capillary 139 (H) 65 - 99 mg/dL  Glucose, capillary     Status: Abnormal   Collection Time: 08/31/15  8:40 PM  Result Value Ref Range   Glucose-Capillary 123 (H) 65 - 99 mg/dL  Glucose, capillary     Status: Abnormal   Collection Time: 09/01/15  7:16 AM  Result Value Ref Range   Glucose-Capillary 152 (H) 65 - 99 mg/dL    Physical Findings: AIMS: Facial and Oral Movements Muscles of Facial Expression: None, normal Lips and Perioral Area: None, normal Jaw: None, normal Tongue: None, normal,Extremity Movements Upper (arms, wrists, hands, fingers): None, normal Lower (legs, knees, ankles, toes): None, normal, Trunk Movements Neck, shoulders, hips: None, normal, Overall Severity Severity of abnormal movements (highest score from questions above): None,  normal Incapacitation due to abnormal movements: None, normal Patient's awareness of abnormal movements (rate only patient's report): No Awareness, Dental Status Current problems with teeth and/or dentures?: No Does patient usually wear dentures?: No  CIWA:  CIWA-Ar Total: 1 COWS:  COWS Total Score: 1  Musculoskeletal: Strength & Muscle Tone: within normal limits Gait & Station: normal Patient leans: N/A  Psychiatric Specialty Exam: Review of Systems  Constitutional: Positive for malaise/fatigue. Negative for fever, chills, weight loss and diaphoresis.  HENT: Negative.   Eyes: Negative.   Respiratory: Negative.   Cardiovascular: Negative.   Gastrointestinal: Negative.   Genitourinary: Negative.   Musculoskeletal: Negative.   Skin: Negative.   Neurological: Positive for weakness. Negative for dizziness, tingling, tremors, sensory change, speech change, focal weakness, seizures and loss of consciousness.  Endo/Heme/Allergies: Negative.   Psychiatric/Behavioral: Positive for depression and substance abuse. Negative for suicidal ideas, hallucinations and memory loss. The patient is not nervous/anxious and does not have insomnia.     Blood pressure 134/85, pulse 70, temperature 98.2 F (36.8 C), temperature source Oral, resp. rate 20, height 5\' 11"  (1.803 m), weight 94.348 kg (208 lb), SpO2 100 %.Body mass index is 29.02 kg/(m^2).  General Appearance: Disheveled  Eye Contact::  Minimal  Speech:  Slurred  Volume:  Decreased  Mood:  Dysphoric  Affect:  Constricted  Thought Process:  Logical  Orientation:  Full (Time, Place, and Person)  Thought Content:  Hallucinations: None  Suicidal Thoughts:  No  Homicidal Thoughts:  No  Memory:  Immediate;   Fair Recent;   Fair Remote;   Fair  Judgement:  Fair  Insight:  Fair  Psychomotor Activity:  Decreased  Concentration:  Fair  Recall:  NA  Fund of Knowledge:Fair  Language: Fair  Akathisia:  No  Handed:    AIMS (if indicated):      Assets:  Financial Resources/Insurance  ADL's:  Intact  Cognition: WNL  Sleep:  Number of Hours: 8.3   Treatment Plan Summary: Daily contact with patient to assess and evaluate symptoms and progress in treatment and Medication management   54 year old male with a history of alcohol and cocaine dependence and a questionable history of schizoaffective disorder. The patient presented to the emergency department in [place] after he overdosed on 3 weeks worth of medication. This patient has had a multitude of visits and hospitalizations in [place] due to poorly controlled hypertension, substance abuse and withdrawals. Clearly this patient is a struggling with maintaining sobriety and will benefit from long-term inpatient substance abuse treatment.  Schizoaffective disorder versus alcohol-induced psychotic disorder: Prior to admission appears patient was prescribed with Abilify 5 low-dose mainly for hyperprolactinemia and Invega sustained at 117 mg per month. Patient also was receiving act team services to [facility]. At this time the diagnosis of schizoaffective disorder is questionable and the need for antipsychotics is unclear. Will not restart invega or abilify.  No evidence of paranoia, delusional thinking, or hallucinations.  Spoke with  with ACT team on 10/13. They report they have not is patient having some delusions mainly related to relationships with family members. No details were provided as to the content of the delusional thinking. They were not aware that the patient had passed hemorrhagic strokes and likely cognitive issues.  Patient has not been sober for significant periods of time he has been using alcohol and crack on an ongoing basis which is likely to explain his past psychotic symptoms.  I do not believe patient suffers from a primary psychotic disorder.  Alcohol dependence: Continue naltrexone 50 mg a day.  Alcohol withdrawal: resolved  Hypertension: Blood pressure much  improved this morning . The patient is currently on Norvasc 10 mg by mouth twice a day, atenolol 75 mg by mouth twice a day, clonidine 0.3 mg by mouth twice a day, lisinopril 20 mg,  and hydralazine 25 mg q 8 h. every 12 hours. Hospitalist is following up this patient in assisting with blood pressure management.  Diabetes: Patient will be continued on metformin 1000 mg by mouth twice a day a.m. Amaryl 4 mg by mouth daily. Patient will receive a card-controlled diet. Capillary blood sugar will be check 4 times a day. He will receive supplemental insulin. No changes  Hypercholesterolemia:  continued on simvastatin 20 mg by mouth daily  Tobacco use disorder: Continue nicotine patch 21 mg a day  Precautions every 15 minute checks along with withdrawal precautions  Diet: Carb modified a low sodium.  Vital signs will be check every 8 hours  Discharge disposition: planning to find ALF as pt has had multiple hospitalizations for poorly controlled hypertension, polysubstance abuse. Per records there is history of cerebrovascular accidents in the past. Social worker currently trying to get PSAAR.  PPD given on 10/13.  Per act team they try to place patient at the beginning of October into a group home but at the last minute  the patient refuses as he was concerned about losing his money and only getting $60 a month.  I discussed this with the patient however he is now willing to go to an assisted-living facility .   Hildred Priest 09/01/2015, 9:46 AM

## 2015-09-01 NOTE — Plan of Care (Signed)
Problem: Ineffective individual coping Goal: LTG: Patient will report a decrease in negative feelings Outcome: Not Progressing Patient states that he is depressed.

## 2015-09-01 NOTE — Plan of Care (Signed)
Problem: [facility] Participation in Recreation Therapeutic Interventions Goal: STG-Patient will demonstrate improved self esteem by identif STG: Self-Esteem - Within 4 treatment sessions, patient will verbalize at least 5 positive affirmation statements in each of 2 treatment sessions to increase self-esteem post d/c.  Outcome: Completed/Met Date Met:  09/01/15 Treatment Session 2; Completed 2 out of 2: At approximately 12:30 pm, LRT met with patient in patient room. Patient verbalized 5 positive affirmation statements. Patient reported it felt "pretty good". LRT encouraged patient to continue saying positive affirmation statements.  [name], LRT/CTRS 10.14.16 5:43 pm Goal: STG-Patient will identify at least five coping skills for ** STG: Coping Skills - Within 4 treatment sessions, patient will verbalize at least 5 coping skills for substance abuse in each of 2 treatment sessions to decrease substance abuse post d/c.  Outcome: Completed/Met Date Met:  09/01/15 Treatment Session 2; Completed 2 out of 2: At approximately 12:30 pm, LRT met with patient in patient room. Patient verbalized 5 coping skills for substance abuse. LRT encouraged patient to participate in leisure activities.  [name], LRT/CTRS 10.14.16 5:44 pm Goal: STG-Other Recreation Therapy Goal (Specify) STG: Stress Management - Within 4 treatment sessions, patient will verbalize understanding of the stress management techniques in each of 2 treatment sessions to increase stress management skills post d/c.  Outcome: Completed/Met Date Met:  09/01/15 Treatment Session 2; Completed 2 out of 2: At approximately 12:30 pm, LRT met with patient in patient room. Patient reported he read over the stress management techniques. Patient verbalized understanding of the stress management techniques. LRT encouraged patient to practice the stress management techniques.  [name], LRT/CTRS 10.14.16 5:47 pm

## 2015-09-01 NOTE — Progress Notes (Signed)
D.Patient denies suicidal ideations. Patient states that he depressed. Patient states that he is nauseas and is waiting for his medications to start working. Patient refuses meds and meals due to nausea. Patient is calm and cooperative. A. Patient given PRN Zofran 4mg . Patient instructed on medications regarding nausea and verbalized understanding. Q15 min checks maintained during shift for safety.Will continue to monitor. R. Patient currently resting in his room.

## 2015-09-01 NOTE — BHH Group Notes (Signed)
[facility] LCSW Aftercare Discharge Planning Group Note   09/01/2015 11:07 AM  Participation Quality:  Patient attended and participated in group discussion minimally. Patient introduced himself and shared that his SMART goal is to "get myself together by staying alive, get on the phone and talk to someone" meaning his support network. Patient was attentive throughout group discussion and was able to gain knowledge of the needs that need to be met in his life and how to accomplish tasks to work towards his goals.   Mood/Affect:  Blunted  Thoughts of Suicide:  No Will you contract for safety?   NA  Current AVH:  No  Plan for Discharge/Comments:  Patient is homeless and needs shelter but is interested in ALF placement and will have a followup appt at discharge for mental health.  Transportation Means: Patient is connected with [name] but may need transportation at discharge.   Supports: Patient has [name] and daughters  Keene Breath, MSW, [employer]

## 2015-09-01 NOTE — Progress Notes (Signed)
Recreation Therapy Notes  Date: 10.14.16 Time: 3:00 pm Location: Craft Room  Group Topic: Coping Skills  Goal Area(s) Addresses:  Patient will participate in coping skills. Patient will verbalize benefit of using art as a coping skill.  Behavioral Response: Did not attend  Intervention: Coloring  Activity: Patients were given coloring sheets and instructed to color thinking about what emotions they were feeling and what they were focused on.  Education: LRT educated patients on why art is a good Technical sales engineer.   Education Outcome: Patient did not attend group.  Clinical Observations/Feedback: Patient did not attend group.  [name], LRT/CTRS 09/01/2015 5:11 PM

## 2015-09-01 NOTE — Progress Notes (Signed)
[facility] [place] NAME: [name]    MR#:  [id number]  DATE OF BIRTH:  02-17-1961  SUBJECTIVE:  CHIEF COMPLAINT:  No chief complaint on file.  -Some weakness still present. Sleeping in bed most of the time. Blood pressure is much better controlled today. -Denies any other complaints.  REVIEW OF SYSTEMS:  Review of Systems  Constitutional: Negative for fever and chills.  HENT: Negative for ear discharge, ear pain and nosebleeds.   Eyes: Negative for blurred vision and double vision.  Respiratory: Negative for cough, shortness of breath and wheezing.   Cardiovascular: Negative for chest pain, palpitations and leg swelling.  Gastrointestinal: Negative for nausea, vomiting, abdominal pain, diarrhea and constipation.  Genitourinary: Negative for dysuria and hematuria.  Musculoskeletal: Negative for myalgias.  Neurological: Positive for weakness. Negative for dizziness, sensory change, speech change, focal weakness, seizures and headaches.  Psychiatric/Behavioral: Positive for depression.    DRUG ALLERGIES:  No Known Allergies  VITALS:  Blood pressure 134/85, pulse 70, temperature 98.2 F (36.8 C), temperature source Oral, resp. rate 20, height 5\' 11"  (1.803 m), weight 94.348 kg (208 lb), SpO2 100 %.  PHYSICAL EXAMINATION:  Physical Exam  GENERAL:  54 y.o.-year-old patient lying in the bed with no acute distress.  EYES: Pupils equal, round, reactive to light and accommodation. No scleral icterus. Extraocular muscles intact.  HEENT: Head atraumatic, normocephalic. Oropharynx and nasopharynx clear.  NECK:  Supple, no jugular venous distention. No thyroid enlargement, no tenderness.  LUNGS: Normal breath sounds bilaterally, no wheezing, rales,rhonchi or crepitation. No use of accessory muscles of respiration.  CARDIOVASCULAR: S1, S2 normal. No murmurs, rubs, or gallops.  ABDOMEN: Soft, nontender, nondistended. Bowel  sounds present. No organomegaly or mass.  EXTREMITIES: No pedal edema, cyanosis, or clubbing.  NEUROLOGIC: Cranial nerves II through XII are intact. Muscle strength 5/5 in all extremities. Sensation intact. Gait not checked.  PSYCHIATRIC: The patient is alert and oriented x 3.  SKIN: No obvious rash, lesion, or ulcer.    LABORATORY PANEL:   CBC  Recent Labs Lab 08/29/15 1811  WBC 4.3  HGB 13.6  HCT 41.8  PLT 100*   ------------------------------------------------------------------------------------------------------------------  Chemistries   Recent Labs Lab 08/29/15 1811  NA 137  K 4.0  CL 103  CO2 25  GLUCOSE 243*  BUN 13  CREATININE 0.88  CALCIUM 9.1  AST 127*  ALT 50  ALKPHOS 61  BILITOT 0.2*   ------------------------------------------------------------------------------------------------------------------  Cardiac Enzymes  Recent Labs Lab 08/27/15 1237  TROPONINI <0.03   ------------------------------------------------------------------------------------------------------------------  RADIOLOGY:  No results found.  EKG:   Orders placed or performed during the hospital encounter of 08/26/15  . EKG 12-Lead  . EKG 12-Lead  . EKG 12-Lead  . EKG 12-Lead  . EKG    ASSESSMENT AND PLAN:   54 year old male with history of hypertension, diabetes and hyperlipidemia and depression with schizoaffective disorder admitted to behavioral medicine unit for depression. Medical consult requested for management of his blood pressure and also diabetes mellitus.  #1 Uncontrolled hypertension- much improved this morning. -Continue atenolol, amlodipine, clonidine, lisinopril and increased dose of oral hydralazine.  #2 diabetes mellitus-HbA1c is 7.6. -Continue Amaryl and metformin and sliding scale insulin for now. -Appreciate diabetic nurse coordinator's input  #3 hyperlipidemia-on statin  #4 tobacco use disorder-on nicotine patch.  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 Lorayne Bender and Abilify are on hold -Naltrexone daily started for alcohol dependence -Currently patient  is on trazodone at bedtime,  Disposition plan is to send him to assisted living facility   All the records are reviewed and case discussed with Care Management/Social Workerr. Management plans discussed with the patient, family and they are in agreement.  CODE STATUS: Full Code  TOTAL TIME TAKING CARE OF THIS PATIENT: 37 minutes.   Patient is medically stable. We'll sign off. Please call if any questions   Smayan Hackbart M.D on 09/01/2015 at 12:15 PM  Between 7am to 6pm - Pager - [phone]  After 6pm go to [email or web address] - password [email or web address] [facility]  Office  [phone]  CC: Primary care physician; [place]

## 2015-09-01 NOTE — [facility] Group Notes (Signed)
[facility] LCSW Group Therapy  09/01/2015 12:49 PM  Type of Therapy:  Group Therapy  Participation Level:  Minimal  Participation Quality:  Appropriate and Attentive  Affect:  Appropriate  Cognitive:  Alert, Appropriate and Oriented  Insight:  Improving  Engagement in Therapy:  Improving  Modes of Intervention:  Socialization and Support  Summary of Progress/Problems:Patient attended group and participated minimally but did share during introductions. Patient introduced himself and participated in an introduction exercise when asked to share 3 things he would take with him if stranded on a deserted [place] and patient shared one item and that is his guitar as he likes to play music. Patient was attentive throughout group discussion AEB his body language and eye contact.    Keene Breath, MSW, LCSWA 09/01/2015, 12:49 PM

## 2015-09-01 NOTE — [facility] Group Notes (Signed)
[facility] LCSW Group Therapy  09/01/2015 2:59 PM  Type of Therapy:  Group Therapy  Participation Level:  Did Not Attend  Modes of Intervention:  Discussion, Education, Socialization and Support  Summary of Progress/Problems: Feelings around Relapse. Group members discussed the meaning of relapse and shared personal stories of relapse, how it affected them and others, and how they perceived themselves during this time. Group members were encouraged to identify triggers, warning signs and coping skills used when facing the possibility of relapse. Social supports were discussed and explored in detail.   Mykaela Arena L Kristena Wilhelmi MSW, LCSWA  09/01/2015, 2:59 P 

## 2015-09-02 DIAGNOSIS — R419 Unspecified symptoms and signs involving cognitive functions and awareness: Secondary | ICD-10-CM

## 2015-09-02 DIAGNOSIS — I679 Cerebrovascular disease, unspecified: Secondary | ICD-10-CM

## 2015-09-02 LAB — GLUCOSE, CAPILLARY
GLUCOSE-CAPILLARY: 146 mg/dL — AB (ref 65–99)
GLUCOSE-CAPILLARY: 222 mg/dL — AB (ref 65–99)
GLUCOSE-CAPILLARY: 65 mg/dL (ref 65–99)
Glucose-Capillary: 104 mg/dL — ABNORMAL HIGH (ref 65–99)

## 2015-09-02 NOTE — Plan of Care (Signed)
Problem: Alteration in mood & ability to function due to Goal: LTG-Pt reports reduction in suicidal thoughts (Patient reports reduction in suicidal thoughts and is able to verbalize a safety plan for whenever patient is feeling suicidal)  Outcome: Progressing Patient denies SI/HI  Goal: LTG-Pt verbalizes understanding of importance of med regimen (Patient verbalizes understanding of importance of medication regimen and need to continue outpatient care and support groups)  Outcome: Progressing Patient is compliant with medication.

## 2015-09-02 NOTE — [facility] Group Notes (Signed)
[facility] Group Notes:  (Nursing/MHT/Case Management/Adjunct)  Date:  09/02/2015  Time:  6:42 PM  Type of Therapy:  Group Therapy  Participation Level:  Did Not Attend  Participation Quality:  n/a  Affect:  n/a  Cognitive:  n/a  Insight:  None  Engagement in Group:  None  Modes of Intervention:  n/a  Summary of Progress/Problems:  Jessee Avers 09/02/2015, 6:42 PM

## 2015-09-02 NOTE — Progress Notes (Signed)
D:Quiet and reserved and stays to self.  Out of room only with encouragement except for meals and med pass.  Flat sad affect noted.  Denies S/HI/AVH. A: Support and encouragement given.  Medications given according to orders.  Safety maintained.  R:  Medication compliant.  No group attendance.

## 2015-09-02 NOTE — Progress Notes (Signed)
[facility] MD Progress Note  09/02/2015 12:15 PM [name]  MRN:  [id number] Subjective:  No change patient continues to be withdrawn to his room. Has minimal interaction with peers and the staff. He has a started attending some groups but for the most part he is states  in bed.  He denies suicidality, homicidality or auditory or visual hallucinations. There is no evidence of delusional thinking. As far as depressive symptom patient does appear to have depressed mood, poor energy, poor appetite. As far as side effects he continues to feel very weak and complains of nausea and headaches today from his medications. Yesterday he refuses all his blood pressure medications complaining that he wasn't nauseous. This morning he also complains of headaches.  Blood pressure is again elevated.  Per nursing: D: Pt denies SI/HI/AVH. Pt is pleasant and cooperative. Pt stated he feels better from resting in his room, he appears less anxious, minimal interaction with peers and staff.  A: Pt was offered support and encouragement. Pt was given scheduled medications. Pt was encouraged to attend groups. Q 15 minute checks were done for safety.  R: Pt did not attend evening group and interacts well with peers and staff. Pt is compliant with medication. Pt has no complaints.Pt receptive to treatment and safety maintained on unit.  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 Cerebrovascular disease [I67.9] 09/02/2015  . 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 Malignant hypertension [I10] 08/30/2015  . Alcohol use disorder, severe, dependence ([place]) [F10.20] 08/30/2015  . Alcohol withdrawal ([place]) [F10.239] 08/30/2015  . 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 Thrombocytopenia ([place]) [D69.6] 08/27/2015  . Tobacco use disorder [F17.200] 08/26/2015  . 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 Hyperlipidemia [E78.5] 04/28/2015  . Cocaine use disorder, severe, dependence ([place]) [F14.20] 04/27/2015  . Gout [M10.9] 08/09/2013  . Diabetes mellitus ([place]) [E11.9] 07/09/2012   Total Time spent with patient: 30 minutes  Past Psychiatric History: Patient has been diagnosed with schizoaffective disorder, polysubstance abuse and has been receiving outpatient medication management from [facility] and also follow with the ACT team.  Patient reports multiple psychiatric hospitalizations mainly at behavioral health in [place] but has also been hospitalized in [facility]. The patient reports a multitude of suicidal attempts throughout his life mainly by overdose.  Risk to Self: Is patient at risk for suicide?: No Risk to Others:  no Prior Inpatient Therapy:  yes Prior Outpatient Therapy:  yes  Alcohol Screening: 1. How often do you have a drink containing alcohol?: 4 or more times a week 2. How many drinks containing alcohol do you have on a typical day when you are drinking?: 3 or 4 3. How often do you have six or more drinks on one occasion?: Weekly Preliminary Score: 4 4. How often during the last year have you found that you were not able to stop drinking once you had started?: Never 5. How often during the last year have you failed to do what was normally expected from you becasue of drinking?: Never 6. How often during the last year have you needed a first drink in the morning to get yourself going after a heavy drinking session?: Never 7. How often during the last year have you had a feeling of guilt of remorse after drinking?: Never  8. How often during the last year have you been unable to remember what happened the night before because you had been drinking?: Never 9. Have you or someone else been injured as a  result of your drinking?: No 10. Has a relative or friend or a doctor or another health worker been concerned about your drinking or suggested you cut down?: No Alcohol Use Disorder Identification Test Final Score (AUDIT): 8 Brief Intervention: Patient declined brief intervention Substance Abuse History in the last 12 months: Yes.  Consequences of Substance Abuse: Medical Consequences: poor compliance, worsening mood. Previous Psychotropic Medications: Yes  Psychological Evaluations: No   Past Medical History: Patient follows up at [facility] in [place]. Past Medical History  Diagnosis Date  . Drug abuse   . Alcohol abuse   . Diabetes mellitus   . Uncontrolled hypertension   . Arthritis   . Gout   . DTs (delirium tremens) ([place])     history of   . Crack cocaine use   . Noncompliance with medication regimen   . Depression   . Schizophrenia ([place])   . Stroke [facility])     Past Surgical History  Procedure Laterality Date  . Mandible reconstruction     Family History:  Family History  Problem Relation Age of Onset  . Hypertension     Family Psychiatric History: Patient reports that his mother was "mental". Denies history of suicidal attempts in his family. Denies history of substance abuse.  Social History: Patient reports he has been receiving disability for the last 8 years for mental health issues. He is currently separated from his wife. He has 7 children were grown. He has on and off contact with them. For the last 3 years he has been living with his girlfriend but is states he does not want to return and live with her. She pressed charges on him a few months ago and he has a court hearing on October 20 for assault on a male. As far as his legal history patient states he's been in prison 3 times. His last hospitalization and longest was from 1990-1994. The patient does not want to disclose what his charges were  but denies being a registered sex offender. He has a daughter who is 53 years old lives in [place] and is hoping he can go and stay with her.  History  Alcohol Use  . 0.6 - 1.2 oz/week  . 1-2 Cans of beer per week    Comment: [facility]    History  Drug Use  . Yes  . Special: "Crack" cocaine, Cocaine    Social History   Social History  . Marital Status: Legally Separated    Spouse Name: N/A  . Number of Children: N/A  . Years of Education: N/A   Social History Main Topics  . Smoking status: Current Every Day Smoker -- 0.25 packs/day    Types: Cigarettes  . Smokeless tobacco: Never Used  . Alcohol Use: 0.6 - 1.2 oz/week    1-2 Cans of beer per week     Comment: [facility]  . Drug Use: Yes    Special: "Crack" cocaine, Cocaine  . Sexual Activity: Not Asked   Other Topics Concern  . None   Social History Narrative        Sleep: Good  Appetite:  Good  Current Medications: Current Facility-Administered Medications  Medication Dose Route Frequency Provider Last Rate Last Dose  . acetaminophen (TYLENOL) tablet 650 mg  650  mg Oral Q6H PRN Clovis Fredrickson, MD   650 mg at 08/30/15 0948  . alum & mag hydroxide-simeth (MAALOX/MYLANTA) 200-200-20 MG/5ML suspension 30 mL  30 mL Oral Q4H PRN Jolanta B Pucilowska, MD      . amLODipine (NORVASC) tablet 10 mg  10 mg Oral Daily Jolanta B Pucilowska, MD   10 mg at 09/02/15 0847  . atenolol (TENORMIN) tablet 75 mg  75 mg Oral BID Clovis Fredrickson, MD   75 mg at 09/02/15 0846  . benzocaine (ORAJEL) 10 % mucosal gel   Mouth/Throat TID AC & HS Hildred Priest, MD      . cloNIDine (CATAPRES) tablet 0.3 mg  0.3 mg Oral BID Clovis Fredrickson, MD   0.3 mg at 09/02/15 0846  . glimepiride (AMARYL) tablet 4 mg  4 mg Oral Q breakfast Jolanta B Pucilowska, MD   4 mg at 09/02/15 0848  . hydrALAZINE (APRESOLINE) tablet 100 mg  100 mg Oral 3  times per day Gladstone Lighter, MD   100 mg at 09/02/15 0650  . ibuprofen (ADVIL,MOTRIN) tablet 600 mg  600 mg Oral Q6H PRN Hildred Priest, MD      . insulin aspart (novoLOG) injection 0-15 Units  0-15 Units Subcutaneous TID WC Max Sane, MD   5 Units at 09/02/15 1148  . insulin aspart (novoLOG) injection 0-5 Units  0-5 Units Subcutaneous QHS Max Sane, MD   0 Units at 08/30/15 2338  . lisinopril (PRINIVIL,ZESTRIL) tablet 20 mg  20 mg Oral Daily Hildred Priest, MD   20 mg at 09/02/15 0847  . magnesium hydroxide (MILK OF MAGNESIA) suspension 30 mL  30 mL Oral Daily PRN Jolanta B Pucilowska, MD      . metFORMIN (GLUCOPHAGE) tablet 1,000 mg  1,000 mg Oral BID WC Jolanta B Pucilowska, MD   1,000 mg at 09/02/15 0847  . naltrexone (DEPADE) tablet 50 mg  50 mg Oral Daily Jolanta B Pucilowska, MD   50 mg at 09/02/15 0846  . nicotine (NICODERM CQ - dosed in mg/24 hours) patch 21 mg  21 mg Transdermal Q0600 Clovis Fredrickson, MD   21 mg at 09/02/15 0650  . ondansetron (ZOFRAN) tablet 4 mg  4 mg Oral Q8H PRN Hildred Priest, MD   4 mg at 09/01/15 1246  . pantoprazole (PROTONIX) EC tablet 40 mg  40 mg Oral Daily Jolanta B Pucilowska, MD   40 mg at 09/02/15 0847  . simvastatin (ZOCOR) tablet 20 mg  20 mg Oral q1800 Jolanta B Pucilowska, MD   20 mg at 09/01/15 1647  . traZODone (DESYREL) tablet 100 mg  100 mg Oral QHS PRN Hildred Priest, MD      . tuberculin injection 5 Units  5 Units Intradermal Once Hildred Priest, MD   5 Units at 09/01/15 1648    Lab Results:  Results for orders placed or performed during the hospital encounter of 08/29/15 (from the past 48 hour(s))  Glucose, capillary     Status: Abnormal   Collection Time: 08/31/15 12:18 PM  Result Value Ref Range   Glucose-Capillary 147 (H) 65 - 99 mg/dL  Glucose, capillary     Status: Abnormal   Collection Time: 08/31/15  4:49 PM  Result Value Ref Range   Glucose-Capillary 139 (H) 65 - 99  mg/dL  Glucose, capillary     Status: Abnormal   Collection Time: 08/31/15  8:40 PM  Result Value Ref Range   Glucose-Capillary 123 (H) 65 - 99 mg/dL  Glucose,  capillary     Status: Abnormal   Collection Time: 09/01/15  7:16 AM  Result Value Ref Range   Glucose-Capillary 152 (H) 65 - 99 mg/dL  Glucose, capillary     Status: None   Collection Time: 09/01/15 12:01 PM  Result Value Ref Range   Glucose-Capillary 90 65 - 99 mg/dL  Glucose, capillary     Status: None   Collection Time: 09/01/15  4:45 PM  Result Value Ref Range   Glucose-Capillary 81 65 - 99 mg/dL  Glucose, capillary     Status: Abnormal   Collection Time: 09/01/15  8:56 PM  Result Value Ref Range   Glucose-Capillary 100 (H) 65 - 99 mg/dL  Glucose, capillary     Status: Abnormal   Collection Time: 09/02/15  6:48 AM  Result Value Ref Range   Glucose-Capillary 146 (H) 65 - 99 mg/dL   Comment 1 Notify RN   Glucose, capillary     Status: Abnormal   Collection Time: 09/02/15 11:47 AM  Result Value Ref Range   Glucose-Capillary 222 (H) 65 - 99 mg/dL    Physical Findings: AIMS: Facial and Oral Movements Muscles of Facial Expression: None, normal Lips and Perioral Area: None, normal Jaw: None, normal Tongue: None, normal,Extremity Movements Upper (arms, wrists, hands, fingers): None, normal Lower (legs, knees, ankles, toes): None, normal, Trunk Movements Neck, shoulders, hips: None, normal, Overall Severity Severity of abnormal movements (highest score from questions above): None, normal Incapacitation due to abnormal movements: None, normal Patient's awareness of abnormal movements (rate only patient's report): No Awareness, Dental Status Current problems with teeth and/or dentures?: No Does patient usually wear dentures?: No  CIWA:  CIWA-Ar Total: 1 COWS:  COWS Total Score: 1  Musculoskeletal: Strength & Muscle Tone: within normal limits Gait & Station: normal Patient leans: N/A  Psychiatric Specialty  Exam: Review of Systems  Constitutional: Positive for malaise/fatigue. Negative for fever, chills, weight loss and diaphoresis.  HENT: Negative.   Eyes: Negative.   Respiratory: Negative.   Cardiovascular: Negative.   Gastrointestinal: Negative.   Genitourinary: Negative.   Musculoskeletal: Negative.   Skin: Negative.   Neurological: Positive for weakness. Negative for dizziness, tingling, tremors, sensory change, speech change, focal weakness, seizures and loss of consciousness.  Endo/Heme/Allergies: Negative.   Psychiatric/Behavioral: Positive for depression and substance abuse. Negative for suicidal ideas, hallucinations and memory loss. The patient is not nervous/anxious and does not have insomnia.     Blood pressure 158/108, pulse 76, temperature 98.4 F (36.9 C), temperature source Oral, resp. rate 20, height 5\' 11"  (1.803 m), weight 94.348 kg (208 lb), SpO2 100 %.Body mass index is 29.02 kg/(m^2).  General Appearance: Disheveled  Eye Contact::  Minimal  Speech:  Slurred  Volume:  Decreased  Mood:  Dysphoric  Affect:  Constricted  Thought Process:  Logical  Orientation:  Full (Time, Place, and Person)  Thought Content:  Hallucinations: None  Suicidal Thoughts:  No  Homicidal Thoughts:  No  Memory:  Immediate;   Fair Recent;   Fair Remote;   Fair  Judgement:  Fair  Insight:  Fair  Psychomotor Activity:  Decreased  Concentration:  Fair  Recall:  NA  Fund of Knowledge:Fair  Language: Fair  Akathisia:  No  Handed:    AIMS (if indicated):     Assets:  Financial Resources/Insurance  ADL's:  Intact  Cognition: WNL  Sleep:  Number of Hours: 8.5   Treatment Plan Summary: Daily contact with patient to assess and evaluate symptoms and progress  in treatment and Medication management   54 year old male with a history of alcohol and cocaine dependence and a questionable history of schizoaffective disorder. The patient presented to the emergency department in [place] after  he overdosed on 3 weeks worth of medication. This patient has had a multitude of visits and hospitalizations in [place] due to poorly controlled hypertension, substance abuse and withdrawals. Clearly this patient is a struggling with maintaining sobriety and will benefit from long-term inpatient substance abuse treatment.  Schizoaffective disorder versus alcohol-induced psychotic disorder: Prior to admission appears patient was prescribed with Abilify 5 low-dose mainly for hyperprolactinemia and Invega sustained at 117 mg per month. Patient also was receiving act team services to [facility]. At this time the diagnosis of schizoaffective disorder is questionable and the need for antipsychotics is unclear. Will not restart invega or abilify.  No evidence of paranoia, delusional thinking, or hallucinations.  Spoke with  with ACT team on 10/13. They report they have not is patient having some delusions mainly related to relationships with family members. No details were provided as to the content of the delusional thinking. They were not aware that the patient had passed hemorrhagic strokes and likely cognitive issues.  Patient has not been sober for significant periods of time he has been using alcohol and crack on an ongoing basis which is likely to explain his past psychotic symptoms.  I do not believe patient suffers from a primary psychotic disorder.  Alcohol dependence: Continue naltrexone 50 mg a day.  Alcohol withdrawal: resolved  Cerebrovascular disease: History of passive strokes. He has had ischemic and hemorrhagic strokes.  Cognitive disorder: Likely secondary to cerebro vascular disease  Hypertension: Per nursing patient refuses all his blood pressure medications yesterday as he complained of nausea.  Today blood pressure is elevated. The patient is currently on Norvasc 10 mg by mouth twice a day, atenolol 75 mg by mouth twice a day, clonidine 0.3 mg by mouth twice a day, lisinopril 20 mg,  and  hydralazine 25 mg q 8 h. every 12 hours. Hospitalist is following up this patient in assisting with blood pressure management.  Diabetes: Patient will be continued on metformin 1000 mg by mouth twice a day a.m. Amaryl 4 mg by mouth daily. Patient will receive a card-controlled diet. Capillary blood sugar will be check 4 times a day. He will receive supplemental insulin. No changes  Hypercholesterolemia:  continued on simvastatin 20 mg by mouth daily  Tobacco use disorder: Continue nicotine patch 21 mg a day  Precautions every 15 minute checks along with withdrawal precautions  Diet: Carb modified a low sodium.  Vital signs will be check every 8 hours  Discharge disposition: planning to find ALF as pt has had multiple hospitalizations for poorly controlled hypertension, polysubstance abuse, and psychosis (which in my opinion secondary to substance abuse). Patient is unable to care for himself without supervision due to cognitive issues. Per records there is history of cerebrovascular accidents in the past. Social worker currently trying to get PSAAR.  PPD given on 10/13.  Per act team they try to place patient at the beginning of October into a group home but at the last minute the patient refuses as he was concerned about losing his money and only getting $60 a month.  I discussed this with the patient however he is now willing to go to an assisted-living facility .   Hildred Priest 09/02/2015, 12:15 PM

## 2015-09-02 NOTE — [facility] Group Notes (Signed)
[facility] LCSW Group Therapy  09/02/2015 11:08 AM  Type of Therapy:  Group Therapy  Participation Level:  Did Not Attend  Modes of Intervention:  Discussion, Education, Socialization and Support  Summary of Progress/Problems: Balance in life: Patients will discuss the concept of balance and how it looks and feels to be unbalanced. Pt will identify areas in their life that is unbalanced and ways to become more balanced.    Shunta Mclaurin L Rashay Barnette MSW, LCSWA  09/02/2015, 11:08 AM  

## 2015-09-02 NOTE — Progress Notes (Signed)
D: Pt denies SI/HI/AVH. Pt is pleasant and cooperative. Pt stated he feels better from resting in his room, he appears less anxious, minimal interaction with peers and staff.  A: Pt was offered support and encouragement. Pt was given scheduled medications. Pt was encouraged to attend groups. Q 15 minute checks were done for safety.  R: Pt did not attend evening group and interacts well with peers and staff. Pt is compliant with medication. Pt has no complaints.Pt receptive to treatment and safety maintained on unit.

## 2015-09-03 LAB — GLUCOSE, CAPILLARY
GLUCOSE-CAPILLARY: 135 mg/dL — AB (ref 65–99)
Glucose-Capillary: 163 mg/dL — ABNORMAL HIGH (ref 65–99)
Glucose-Capillary: 106 mg/dL — ABNORMAL HIGH (ref 65–99)
Glucose-Capillary: 93 mg/dL (ref 65–99)

## 2015-09-03 MED ORDER — PANTOPRAZOLE SODIUM 40 MG PO TBEC
40.0000 mg | DELAYED_RELEASE_TABLET | Freq: Two times a day (BID) | ORAL | Status: DC
  Administered 2015-09-03 – 2015-09-07 (×8): 40 mg via ORAL
  Filled 2015-09-03 (×8): qty 1

## 2015-09-03 MED ORDER — SENNA 8.6 MG PO TABS
1.0000 | ORAL_TABLET | Freq: Every day | ORAL | Status: DC
  Administered 2015-09-03 – 2015-09-06 (×4): 8.6 mg via ORAL
  Filled 2015-09-03 (×4): qty 1

## 2015-09-03 MED ORDER — MIRTAZAPINE 15 MG PO TABS
15.0000 mg | ORAL_TABLET | Freq: Every day | ORAL | Status: DC
  Administered 2015-09-03 – 2015-09-06 (×4): 15 mg via ORAL
  Filled 2015-09-03 (×4): qty 1

## 2015-09-03 MED ORDER — GLUCERNA SHAKE PO LIQD
237.0000 mL | Freq: Three times a day (TID) | ORAL | Status: DC
  Administered 2015-09-03 – 2015-09-06 (×7): 237 mL via ORAL

## 2015-09-03 MED ORDER — ONDANSETRON HCL 4 MG PO TABS
4.0000 mg | ORAL_TABLET | Freq: Two times a day (BID) | ORAL | Status: DC
  Administered 2015-09-03 – 2015-09-07 (×8): 4 mg via ORAL
  Filled 2015-09-03 (×8): qty 1

## 2015-09-03 MED ORDER — POLYETHYLENE GLYCOL 3350 17 G PO PACK
17.0000 g | PACK | Freq: Every day | ORAL | Status: AC
  Administered 2015-09-03 – 2015-09-04 (×2): 17 g via ORAL
  Filled 2015-09-03: qty 1

## 2015-09-03 NOTE — [facility] Group Notes (Signed)
[facility] LCSW Group Therapy  09/03/2015 11:43 AM  Type of Therapy:  Group Therapy  Participation Level:  Did Not Attend  Modes of Intervention:  Discussion, Education, Socialization and Support  Summary of Progress/Problems: Pt will identify unhealthy thoughts and how they impact their emotions and behavior. Pt will be encouraged to discuss these thoughts, emotions and behaviors with the group.   Kit Carson MSW, [place]  09/03/2015, 11:43 AM

## 2015-09-03 NOTE — Progress Notes (Signed)
[name] MD Progress Note  09/03/2015 12:13 PM [name]  MRN:  [id number] Subjective:  No change patient continues to be withdrawn to his room. Has minimal interaction with peers and the staff. He has a started attending some groups but for the most part he is states  in bed.  He denies suicidality, homicidality or auditory or visual hallucinations. There is no evidence of delusional thinking. As far as depressive symptom patient does appear to have depressed mood, poor energy, poor appetite. As far as side effects he continues to feel very weak and complains of nausea from his medications. He also complained of constipation.  Per nursing: [id number] and reserved and stays to self. Out of room only with encouragement except for meals and med pass. Flat sad affect noted. Denies S/HI/AVH. A: Support and encouragement given. Medications given according to orders. Safety maintained.  R: Medication compliant. No group attendance.   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 Cerebrovascular disease [I67.9] 09/02/2015  . 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 Malignant hypertension [I10] 08/30/2015  . Alcohol use disorder, severe, dependence ([name]) [F10.20] 08/30/2015  . Alcohol withdrawal ([name]) [F10.239] 08/30/2015  . 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 Thrombocytopenia ([name]) [D69.6] 08/27/2015  . Tobacco use disorder [F17.200] 08/26/2015  . Hyperprolactinemia ([place]) [E22.1] 04/29/2015  . Hyperlipidemia [E78.5] 04/28/2015  . Cocaine use disorder, severe, dependence ([place]) [F14.20] 04/27/2015  . Gout [M10.9] 08/09/2013  . Diabetes mellitus ([place]) [E11.9] 07/09/2012   Total Time spent with patient:  30 minutes  Past Psychiatric History: Patient has been diagnosed with schizoaffective disorder, polysubstance abuse and has been receiving outpatient medication management from [facility] and also follow with the ACT team.  Patient reports multiple psychiatric hospitalizations mainly at behavioral health in [place] but has also been hospitalized in [facility] regional. The patient reports a multitude of suicidal attempts throughout his life mainly by overdose.  Risk to Self: Is patient at risk for suicide?: No Risk to Others:  no Prior Inpatient Therapy:  yes Prior Outpatient Therapy:  yes  Alcohol Screening: 1. How often do you have a drink containing alcohol?: 4 or more times a week 2. How many drinks containing alcohol do you have on a typical day when you are drinking?: 3 or 4 3. How often do you have six or more drinks on one occasion?: Weekly Preliminary Score: 4 4. How often during the last year have you found that you were not able to stop drinking once you had started?: Never 5. How often during the last year have you failed to do what was normally expected from you becasue of drinking?: Never 6. How often during the last year have you needed a first drink in the morning to get yourself going after a heavy drinking session?: Never 7. How often during the last year have you had a feeling of guilt of remorse after drinking?: Never 8. How often during the last year have you been unable to remember what happened the night before because you had been drinking?: Never 9. Have you or someone else been injured as a result of your drinking?: No 10. Has a relative or friend or a doctor or another health worker been concerned about your drinking or suggested you cut down?: No Alcohol Use Disorder Identification  Test Final Score (AUDIT): 8 Brief Intervention: Patient declined brief intervention Substance Abuse History in the last 12 months: Yes.  Consequences of Substance Abuse: Medical  Consequences: poor compliance, worsening mood. Previous Psychotropic Medications: Yes  Psychological Evaluations: No   Past Medical History: Patient follows up at [facility] in [place]. Past Medical History  Diagnosis Date  . Drug abuse   . Alcohol abuse   . Diabetes mellitus   . Uncontrolled hypertension   . Arthritis   . Gout   . DTs (delirium tremens) ([place])     history of   . Crack cocaine use   . Noncompliance with medication regimen   . Depression   . Schizophrenia ([place])   . Stroke [facility])     Past Surgical History  Procedure Laterality Date  . Mandible reconstruction     Family History:  Family History  Problem Relation Age of Onset  . Hypertension     Family Psychiatric History: Patient reports that his mother was "mental". Denies history of suicidal attempts in his family. Denies history of substance abuse.  Social History: Patient reports he has been receiving disability for the last 8 years for mental health issues. He is currently separated from his wife. He has 7 children were grown. He has on and off contact with them. For the last 3 years he has been living with his girlfriend but is states he does not want to return and live with her. She pressed charges on him a few months ago and he has a court hearing on October 20 for assault on a male. As far as his legal history patient states he's been in prison 3 times. His last hospitalization and longest was from 1990-1994. The patient does not want to disclose what his charges were but denies being a registered sex offender. He has a daughter who is 53 years old lives in [place] and is hoping he can go and stay with her.  History  Alcohol Use  . 0.6 - 1.2 oz/week  . 1-2 Cans of beer per week    Comment: [facility]    History  Drug Use  . Yes  . Special: "Crack" cocaine, Cocaine    Social History   Social History  .  Marital Status: Legally Separated    Spouse Name: N/A  . Number of Children: N/A  . Years of Education: N/A   Social History Main Topics  . Smoking status: Current Every Day Smoker -- 0.25 packs/day    Types: Cigarettes  . Smokeless tobacco: Never Used  . Alcohol Use: 0.6 - 1.2 oz/week    1-2 Cans of beer per week     Comment: [facility]  . Drug Use: Yes    Special: "Crack" cocaine, Cocaine  . Sexual Activity: Not Asked   Other Topics Concern  . None   Social History Narrative        Sleep: Good  Appetite:  Good  Current Medications: Current Facility-Administered Medications  Medication Dose Route Frequency Provider Last Rate Last Dose  . acetaminophen (TYLENOL) tablet 650 mg  650 mg Oral Q6H PRN Clovis Fredrickson, MD   650 mg at 08/30/15 0948  . alum & mag hydroxide-simeth (MAALOX/MYLANTA) 200-200-20 MG/5ML suspension 30 mL  30 mL Oral Q4H PRN Jolanta B Pucilowska, MD      . amLODipine (NORVASC) tablet 10 mg  10 mg Oral Daily Jolanta B Pucilowska, MD   10 mg at 09/03/15 0930  . atenolol (  TENORMIN) tablet 75 mg  75 mg Oral BID Jolanta B Pucilowska, MD   75 mg at 09/03/15 0930  . benzocaine (ORAJEL) 10 % mucosal gel   Mouth/Throat TID AC & HS Hildred Priest, MD      . cloNIDine (CATAPRES) tablet 0.3 mg  0.3 mg Oral BID Clovis Fredrickson, MD   0.3 mg at 09/03/15 0929  . feeding supplement (GLUCERNA SHAKE) (GLUCERNA SHAKE) liquid 237 mL  237 mL Oral TID BM Hildred Priest, MD      . glimepiride (AMARYL) tablet 4 mg  4 mg Oral Q breakfast Jolanta B Pucilowska, MD   4 mg at 09/03/15 0929  . hydrALAZINE (APRESOLINE) tablet 100 mg  100 mg Oral 3 times per day Gladstone Lighter, MD   100 mg at 09/03/15 [id number]  . ibuprofen (ADVIL,MOTRIN) tablet 600 mg  600 mg Oral Q6H PRN Hildred Priest, MD      . insulin aspart (novoLOG) injection 0-15 Units  0-15 Units Subcutaneous TID WC Max Sane, MD   3  Units at 09/03/15 [phone]  . insulin aspart (novoLOG) injection 0-5 Units  0-5 Units Subcutaneous QHS Max Sane, MD   0 Units at 08/30/15 2338  . lisinopril (PRINIVIL,ZESTRIL) tablet 20 mg  20 mg Oral Daily Hildred Priest, MD   20 mg at 09/03/15 0930  . magnesium hydroxide (MILK OF MAGNESIA) suspension 30 mL  30 mL Oral Daily PRN Jolanta B Pucilowska, MD      . metFORMIN (GLUCOPHAGE) tablet 1,000 mg  1,000 mg Oral BID WC Jolanta B Pucilowska, MD   1,000 mg at 09/03/15 0853  . mirtazapine (REMERON) tablet 15 mg  15 mg Oral QHS Hildred Priest, MD      . nicotine (NICODERM CQ - dosed in mg/24 hours) patch 21 mg  21 mg Transdermal Q0600 Clovis Fredrickson, MD   21 mg at 09/02/15 0650  . ondansetron (ZOFRAN) tablet 4 mg  4 mg Oral Q12H Hildred Priest, MD      . pantoprazole (PROTONIX) EC tablet 40 mg  40 mg Oral BID AC Hildred Priest, MD      . polyethylene glycol (MIRALAX / GLYCOLAX) packet 17 g  17 g Oral Daily Hildred Priest, MD      . senna (SENOKOT) tablet 8.6 mg  1 tablet Oral QHS Hildred Priest, MD      . simvastatin (ZOCOR) tablet 20 mg  20 mg Oral q1800 Jolanta B Pucilowska, MD   20 mg at 09/02/15 1740  . tuberculin injection 5 Units  5 Units Intradermal Once Hildred Priest, MD   5 Units at 09/01/15 1648    Lab Results:  Results for orders placed or performed during the hospital encounter of 08/29/15 (from the past 48 hour(s))  Glucose, capillary     Status: None   Collection Time: 09/01/15  4:45 PM  Result Value Ref Range   Glucose-Capillary 81 65 - 99 mg/dL  Glucose, capillary     Status: Abnormal   Collection Time: 09/01/15  8:56 PM  Result Value Ref Range   Glucose-Capillary 100 (H) 65 - 99 mg/dL  Glucose, capillary     Status: Abnormal   Collection Time: 09/02/15  6:48 AM  Result Value Ref Range   Glucose-Capillary 146 (H) 65 - 99 mg/dL   Comment 1 Notify RN   Glucose, capillary     Status: Abnormal    Collection Time: 09/02/15 11:47 AM  Result Value Ref Range   Glucose-Capillary 222 (H) 65 -  99 mg/dL  Glucose, capillary     Status: None   Collection Time: 09/02/15  4:37 PM  Result Value Ref Range   Glucose-Capillary 65 65 - 99 mg/dL  Glucose, capillary     Status: Abnormal   Collection Time: 09/02/15  8:32 PM  Result Value Ref Range   Glucose-Capillary 104 (H) 65 - 99 mg/dL   Comment 1 Notify RN   Glucose, capillary     Status: Abnormal   Collection Time: 09/03/15  6:42 AM  Result Value Ref Range   Glucose-Capillary 163 (H) 65 - 99 mg/dL   Comment 1 Notify RN     Physical Findings: AIMS: Facial and Oral Movements Muscles of Facial Expression: None, normal Lips and Perioral Area: None, normal Jaw: None, normal Tongue: None, normal,Extremity Movements Upper (arms, wrists, hands, fingers): None, normal Lower (legs, knees, ankles, toes): None, normal, Trunk Movements Neck, shoulders, hips: None, normal, Overall Severity Severity of abnormal movements (highest score from questions above): None, normal Incapacitation due to abnormal movements: None, normal Patient's awareness of abnormal movements (rate only patient's report): No Awareness, Dental Status Current problems with teeth and/or dentures?: No Does patient usually wear dentures?: No  CIWA:  CIWA-Ar Total: 1 COWS:  COWS Total Score: 1  Musculoskeletal: Strength & Muscle Tone: within normal limits Gait & Station: normal Patient leans: N/A  Psychiatric Specialty Exam: Review of Systems  Constitutional: Positive for malaise/fatigue. Negative for fever, chills, weight loss and diaphoresis.  HENT: Negative.   Eyes: Negative.   Respiratory: Negative.   Cardiovascular: Negative.   Gastrointestinal: Positive for nausea and constipation.  Genitourinary: Negative.   Musculoskeletal: Negative.   Skin: Negative.   Neurological: Positive for weakness. Negative for dizziness, tingling, tremors, sensory change, speech  change, focal weakness, seizures and loss of consciousness.  Endo/Heme/Allergies: Negative.   Psychiatric/Behavioral: Positive for depression and substance abuse. Negative for suicidal ideas, hallucinations and memory loss. The patient is not nervous/anxious and does not have insomnia.     Blood pressure 121/84, pulse 74, temperature 98 F (36.7 C), temperature source Oral, resp. rate 20, height 5\' 11"  (1.803 m), weight 94.348 kg (208 lb), SpO2 100 %.Body mass index is 29.02 kg/(m^2).  General Appearance: Disheveled  Eye Contact::  Minimal  Speech:  Slurred  Volume:  Decreased  Mood:  Dysphoric  Affect:  Constricted  Thought Process:  Logical  Orientation:  Full (Time, Place, and Person)  Thought Content:  Hallucinations: None  Suicidal Thoughts:  No  Homicidal Thoughts:  No  Memory:  Immediate;   Fair Recent;   Fair Remote;   Fair  Judgement:  Fair  Insight:  Fair  Psychomotor Activity:  Decreased  Concentration:  Fair  Recall:  NA  Fund of Knowledge:Fair  Language: Fair  Akathisia:  No  Handed:    AIMS (if indicated):     Assets:  Financial Resources/Insurance  ADL's:  Intact  Cognition: WNL  Sleep:  Number of Hours: 7.15   Treatment Plan Summary: Daily contact with patient to assess and evaluate symptoms and progress in treatment and Medication management   54 year old male with a history of alcohol and cocaine dependence and a questionable history of schizoaffective disorder. The patient presented to the emergency department in [place] after he overdosed on 3 weeks worth of medication. This patient has had a multitude of visits and hospitalizations in [place] due to poorly controlled hypertension, substance abuse and withdrawals. Clearly this patient is a struggling with maintaining sobriety and will benefit from  long-term inpatient substance abuse treatment.  Schizoaffective disorder versus alcohol-induced psychotic disorder: Prior to admission appears patient was  prescribed with Abilify 5 low-dose mainly for hyperprolactinemia and Invega sustained at 117 mg per month. Patient also was receiving act team services to [facility]. At this time the diagnosis of schizoaffective disorder is questionable and the need for antipsychotics is unclear. Will not restart invega or abilify.  No evidence of paranoia, delusional thinking, or hallucinations.  Spoke with  with ACT team on 10/13. They report they have not is patient having some delusions mainly related to relationships with family members. No details were provided as to the content of the delusional thinking. They were not aware that the patient had passed hemorrhagic strokes and likely cognitive issues.  Patient has not been sober for significant periods of time he has been using alcohol and crack on an ongoing basis which is likely to explain his past psychotic symptoms.  I do not believe patient suffers from a primary psychotic disorder.  Major depression: Patient has been withdrawn, minimally interactive with peers or staff. States in his room all day lying in bed. I will start him on mirtazapine 15 mg by mouth daily at bedtime.  Alcohol dependence: I we'll discontinue naltrexone as patient has been complaining of severe nausea.  Alcohol withdrawal: resolved  Cerebrovascular disease: History of passive strokes. He has had ischemic and hemorrhagic strokes.  Cognitive disorder: Likely secondary to cerebro vascular disease  Hypertension: Per nursing patient refuses all his blood pressure medications yesterday as he complained of nausea.  Today blood pressure is elevated. The patient is currently on Norvasc 10 mg by mouth twice a day, atenolol 75 mg by mouth twice a day, clonidine 0.3 mg by mouth twice a day, lisinopril 20 mg,  and hydralazine 100 mg q 8 h. every 12 hours. Hospitalist is following up this patient in assisting with blood pressure management.  Diabetes: Patient will be continued on metformin 1000 mg by  mouth twice a day a.m. Amaryl 4 mg by mouth daily. Patient will receive a card-controlled diet. Capillary blood sugar will be check 4 times a day. He will receive supplemental insulin. No changes  Hypercholesterolemia:  continued on simvastatin 20 mg by mouth daily  Daily nausea: I will start the patient on mirtazapine which she'll help with nausea. I also will offer him Zofran twice a day before meals.  GERD: Protonix has been increased to twice a day.  Tobacco use disorder: Continue nicotine patch 21 mg a day  Precautions every 15 minute checks along with withdrawal precautions  Diet: Carb modified a low sodium.  Vital signs will be check every 8 hours  Discharge disposition: planning to find ALF as pt has had multiple hospitalizations for poorly controlled hypertension, polysubstance abuse, and psychosis (which in my opinion secondary to substance abuse). Patient is unable to care for himself without supervision due to cognitive issues. Per records there is history of cerebrovascular accidents in the past. Social worker currently trying to get PSAAR.  PPD given on 10/13.  Per act team they try to place patient at the beginning of October into a group home but at the last minute the patient refuses as he was concerned about losing his money and only getting $60 a month.  I discussed this with the patient however he is now willing to go to an assisted-living facility .   Hildred Priest 09/03/2015, 12:13 PM

## 2015-09-03 NOTE — Progress Notes (Signed)
D: Patient has stayed in the bed all day except to get up for meals. He said he is feeling less depressed than when he came in. Still has low energy level and rates anxiety 8/10. BP has been WNL today.  A: On q 15 minute checks. Given meds. R: Isolative. Depressed.

## 2015-09-04 LAB — GLUCOSE, CAPILLARY
GLUCOSE-CAPILLARY: 124 mg/dL — AB (ref 65–99)
GLUCOSE-CAPILLARY: 94 mg/dL (ref 65–99)
GLUCOSE-CAPILLARY: 92 mg/dL (ref 65–99)
Glucose-Capillary: 136 mg/dL — ABNORMAL HIGH (ref 65–99)

## 2015-09-04 MED ORDER — ATENOLOL 25 MG PO TABS
50.0000 mg | ORAL_TABLET | Freq: Two times a day (BID) | ORAL | Status: DC
  Administered 2015-09-04 – 2015-09-07 (×6): 50 mg via ORAL
  Filled 2015-09-04 (×6): qty 2

## 2015-09-04 MED ORDER — HYDRALAZINE HCL 25 MG PO TABS
25.0000 mg | ORAL_TABLET | Freq: Two times a day (BID) | ORAL | Status: DC
  Administered 2015-09-04: 25 mg via ORAL
  Filled 2015-09-04: qty 1

## 2015-09-04 MED ORDER — CLONIDINE HCL 0.1 MG PO TABS
0.1000 mg | ORAL_TABLET | Freq: Two times a day (BID) | ORAL | Status: DC
  Administered 2015-09-04 – 2015-09-07 (×6): 0.1 mg via ORAL
  Filled 2015-09-04 (×6): qty 1

## 2015-09-04 MED ORDER — LISINOPRIL 10 MG PO TABS
10.0000 mg | ORAL_TABLET | Freq: Every day | ORAL | Status: DC
  Administered 2015-09-05 – 2015-09-06 (×2): 10 mg via ORAL
  Filled 2015-09-04 (×2): qty 1

## 2015-09-04 NOTE — Progress Notes (Signed)
Recreation Therapy Notes  Date: 10.17.16 Time: 3:00 pm Location: Craft Room  Group Topic: Self-expression  Goal Area(s) Addresses:  Patient will be able to identify a color that represents each emotion. Patient will verbalize benefit of using art as a means of self-expression. Patient will verbalize one positive emotion experienced while participating in activity.  Behavioral Response: Did not attend  Intervention: The Colors Within Me  Activity: Patients were given blank face worksheets and instructed to pick a color for each emotion they were experiencing and show how much of the emotion they were experiencing on the face.  Education: LRT educated patients on different forms of self-expression.  Education Outcome: Patient did not attend group.  Clinical Observations/Feedback: Patient did not attend group.         [name] 09/04/2015 4:25 PM

## 2015-09-04 NOTE — Progress Notes (Signed)
D: Patient denies SI/HI/AVH.  Patient affect is anxious and depressed and his mood is depressed.  Patient did NOT attend evening group. Patient remained in his room throughout the shift. No distress noted. A: Support and encouragement offered. Scheduled medications given to pt. Q 15 min checks continued for patient safety. R: Patient receptive. Patient remains safe on the unit.

## 2015-09-04 NOTE — [facility] Group Notes (Signed)
[facility] Group Notes:  (Nursing/MHT/Case Management/Adjunct)  Date:  09/04/2015  Time:  2:17 PM  Type of Therapy:  Psychoeducational Skills  Participation Level:  Did Not Attend   Lorane Gell 09/04/2015, 2:17 PM

## 2015-09-04 NOTE — Progress Notes (Signed)
D: patient observed in his room for most of the shift. Pleasant this shift.  Patient denies any SI or HI at this time or visual or auditory hallucinations.  Patient in no pain.  Patient went to no groups and remained in his bed.  Patient affect is flat. Patient in no distress at this time. Patient compliant with medications  A: support and encouragement provided.  q 15 min checks preformed.  Medications given as prescribed.   R: patient receptive of care and information given

## 2015-09-04 NOTE — Plan of Care (Signed)
Problem: Diagnosis: Increased Risk For Suicide Attempt Goal: STG-Patient Will Attend All Groups On The Unit Outcome: Not Progressing Patient did not attend any groups this shift.  Patient was encouraged to attend all groups but remained in the bed.

## 2015-09-04 NOTE — Progress Notes (Signed)
[facility] MD Progress Note  09/04/2015 9:48 AM [name]  MRN:  [id number] Subjective:  No change patient continues to be withdrawn to his room. Has minimal interaction with peers and the staff. He has a started attending some groups but for the most part he is states  in bed. He says he feels very weak and very tired.  He denies suicidality, homicidality or auditory or visual hallucinations. There is no evidence of delusional thinking. As far as depressive symptom patient does appear to have depressed mood, poor energy, poor appetite. As far as side effects he continues to feel very weak but today he did not complain of nausea or constipation.  Patient feels that all of the weaknesses comment from taking all of the medications he is prescribed with. He tells me that prior to coming into the hospital here he had stopped taking all of them because he was feeling always very sick.  Per nursing: D: Patient denies SI/HI/AVH. Patient affect is anxious and depressed and his mood is depressed. Patient did NOT attend evening group. Patient remained in his room throughout the shift. No distress noted. A: Support and encouragement offered. Scheduled medications given to pt. Q 15 min checks continued for patient safety. R: Patient receptive. Patient remains safe on the unit.   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 Cerebrovascular disease [I67.9] 09/02/2015  . 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 Malignant hypertension [I10] 08/30/2015  . Alcohol use disorder, severe, dependence ([place]) [F10.20] 08/30/2015  . Alcohol withdrawal ([place]) [F10.239] 08/30/2015  . 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 Thrombocytopenia  ([place]) [D69.6] 08/27/2015  . Tobacco use disorder [F17.200] 08/26/2015  . 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 Hyperlipidemia [E78.5] 04/28/2015  . Cocaine use disorder, severe, dependence ([place]) [F14.20] 04/27/2015  . Gout [M10.9] 08/09/2013  . Diabetes mellitus ([place]) [E11.9] 07/09/2012   Total Time spent with patient: 30 minutes  Past Psychiatric History: Patient has been diagnosed with schizoaffective disorder, polysubstance abuse and has been receiving outpatient medication management from [facility] and also follow with the ACT team.  Patient reports multiple psychiatric hospitalizations mainly at behavioral health in [place] but has also been hospitalized in [facility] regional. The patient reports a multitude of suicidal attempts throughout his life mainly by overdose.  Risk to Self: Is patient at risk for suicide?: No Risk to Others:  no Prior Inpatient Therapy:  yes Prior Outpatient Therapy:  yes  Alcohol Screening: 1. How often do you have a drink containing alcohol?: 4 or more times a week 2. How many drinks containing alcohol do you have on a typical day when you are drinking?: 3 or 4 3. How often do you have six or more drinks on one occasion?: Weekly Preliminary Score: 4 4. How often during the last year have you found that you were not able to stop drinking once you had started?: Never 5. How often during the last year have you failed to do what was normally expected from you becasue of drinking?: Never 6. How often during the last year have you needed a first drink in the morning to get yourself going after a heavy drinking session?: Never 7. How often during the last year have you had a feeling of guilt of remorse after drinking?: Never 8.  How often during the last year have you been unable to remember what happened the night before because you had been drinking?: Never 9. Have you or someone else been injured as a result of your drinking?: No 10. Has a relative  or friend or a doctor or another health worker been concerned about your drinking or suggested you cut down?: No Alcohol Use Disorder Identification Test Final Score (AUDIT): 8 Brief Intervention: Patient declined brief intervention Substance Abuse History in the last 12 months: Yes.  Consequences of Substance Abuse: Medical Consequences: poor compliance, worsening mood. Previous Psychotropic Medications: Yes  Psychological Evaluations: No   Past Medical History: Patient follows up at [name] in [place]. Past Medical History  Diagnosis Date  . Drug abuse   . Alcohol abuse   . Diabetes mellitus   . Uncontrolled hypertension   . Arthritis   . Gout   . DTs (delirium tremens) ([place])     history of   . Crack cocaine use   . Noncompliance with medication regimen   . Depression   . Schizophrenia ([place])   . Stroke [facility])     Past Surgical History  Procedure Laterality Date  . Mandible reconstruction     Family History:  Family History  Problem Relation Age of Onset  . Hypertension     Family Psychiatric History: Patient reports that his mother was "mental". Denies history of suicidal attempts in his family. Denies history of substance abuse.  Social History: Patient reports he has been receiving disability for the last 8 years for mental health issues. He is currently separated from his wife. He has 7 children were grown. He has on and off contact with them. For the last 3 years he has been living with his girlfriend but is states he does not want to return and live with her. She pressed charges on him a few months ago and he has a court hearing on October 20 for assault on a male. As far as his legal history patient states he's been in prison 3 times. His last hospitalization and longest was from 1990-1994. The patient does not want to disclose what his charges were but denies being a registered sex offender. He  has a daughter who is 50 years old lives in [place] and is hoping he can go and stay with her.  History  Alcohol Use  . 0.6 - 1.2 oz/week  . 1-2 Cans of beer per week    Comment: [facility]    History  Drug Use  . Yes  . Special: "Crack" cocaine, Cocaine    Social History   Social History  . Marital Status: Legally Separated    Spouse Name: N/A  . Number of Children: N/A  . Years of Education: N/A   Social History Main Topics  . Smoking status: Current Every Day Smoker -- 0.25 packs/day    Types: Cigarettes  . Smokeless tobacco: Never Used  . Alcohol Use: 0.6 - 1.2 oz/week    1-2 Cans of beer per week     Comment: [facility]  . Drug Use: Yes    Special: "Crack" cocaine, Cocaine  . Sexual Activity: Not Asked   Other Topics Concern  . None   Social History Narrative        Sleep: Good  Appetite:  Good  Current Medications: Current Facility-Administered Medications  Medication Dose Route Frequency Provider Last Rate Last Dose  . acetaminophen (TYLENOL) tablet 650 mg  650 mg  Oral Q6H PRN Clovis Fredrickson, MD   650 mg at 09/03/15 1655  . alum & mag hydroxide-simeth (MAALOX/MYLANTA) 200-200-20 MG/5ML suspension 30 mL  30 mL Oral Q4H PRN Jolanta B Pucilowska, MD      . amLODipine (NORVASC) tablet 10 mg  10 mg Oral Daily Jolanta B Pucilowska, MD   10 mg at 09/03/15 0930  . atenolol (TENORMIN) tablet 75 mg  75 mg Oral BID Clovis Fredrickson, MD   75 mg at 09/03/15 2234  . benzocaine (ORAJEL) 10 % mucosal gel   Mouth/Throat TID AC & HS Hildred Priest, MD      . cloNIDine (CATAPRES) tablet 0.3 mg  0.3 mg Oral BID Clovis Fredrickson, MD   0.3 mg at 09/03/15 2234  . feeding supplement (GLUCERNA SHAKE) (GLUCERNA SHAKE) liquid 237 mL  237 mL Oral TID BM Hildred Priest, MD   237 mL at 09/03/15 1454  . glimepiride (AMARYL) tablet 4 mg  4 mg Oral Q breakfast Jolanta B  Pucilowska, MD   4 mg at 09/04/15 0814  . hydrALAZINE (APRESOLINE) tablet 100 mg  100 mg Oral 3 times per day Gladstone Lighter, MD   100 mg at 09/04/15 0655  . ibuprofen (ADVIL,MOTRIN) tablet 600 mg  600 mg Oral Q6H PRN Hildred Priest, MD      . insulin aspart (novoLOG) injection 0-15 Units  0-15 Units Subcutaneous TID WC Max Sane, MD   2 Units at 09/04/15 0748  . insulin aspart (novoLOG) injection 0-5 Units  0-5 Units Subcutaneous QHS Max Sane, MD   0 Units at 08/30/15 2338  . lisinopril (PRINIVIL,ZESTRIL) tablet 20 mg  20 mg Oral Daily Hildred Priest, MD   20 mg at 09/03/15 0930  . magnesium hydroxide (MILK OF MAGNESIA) suspension 30 mL  30 mL Oral Daily PRN Jolanta B Pucilowska, MD      . metFORMIN (GLUCOPHAGE) tablet 1,000 mg  1,000 mg Oral BID WC Jolanta B Pucilowska, MD   1,000 mg at 09/04/15 0748  . mirtazapine (REMERON) tablet 15 mg  15 mg Oral QHS Hildred Priest, MD   15 mg at 09/03/15 2234  . nicotine (NICODERM CQ - dosed in mg/24 hours) patch 21 mg  21 mg Transdermal Q0600 Clovis Fredrickson, MD   21 mg at 09/02/15 0650  . ondansetron (ZOFRAN) tablet 4 mg  4 mg Oral Q12H Hildred Priest, MD   4 mg at 09/03/15 2234  . pantoprazole (PROTONIX) EC tablet 40 mg  40 mg Oral BID AC Hildred Priest, MD   40 mg at 09/04/15 0748  . polyethylene glycol (MIRALAX / GLYCOLAX) packet 17 g  17 g Oral Daily Hildred Priest, MD   17 g at 09/03/15 1241  . senna (SENOKOT) tablet 8.6 mg  1 tablet Oral QHS Hildred Priest, MD   8.6 mg at 09/03/15 2234  . simvastatin (ZOCOR) tablet 20 mg  20 mg Oral q1800 Jolanta B Pucilowska, MD   20 mg at 09/03/15 1727    Lab Results:  Results for orders placed or performed during the hospital encounter of 08/29/15 (from the past 48 hour(s))  Glucose, capillary     Status: Abnormal   Collection Time: 09/02/15 11:47 AM  Result Value Ref Range   Glucose-Capillary 222 (H) 65 - 99 mg/dL   Glucose, capillary     Status: None   Collection Time: 09/02/15  4:37 PM  Result Value Ref Range   Glucose-Capillary 65 65 - 99 mg/dL  Glucose, capillary  Status: Abnormal   Collection Time: 09/02/15  8:32 PM  Result Value Ref Range   Glucose-Capillary 104 (H) 65 - 99 mg/dL   Comment 1 Notify RN   Glucose, capillary     Status: Abnormal   Collection Time: 09/03/15  6:42 AM  Result Value Ref Range   Glucose-Capillary 163 (H) 65 - 99 mg/dL   Comment 1 Notify RN   Glucose, capillary     Status: Abnormal   Collection Time: 09/03/15 12:19 PM  Result Value Ref Range   Glucose-Capillary 135 (H) 65 - 99 mg/dL  Glucose, capillary     Status: Abnormal   Collection Time: 09/03/15  4:52 PM  Result Value Ref Range   Glucose-Capillary 106 (H) 65 - 99 mg/dL  Glucose, capillary     Status: None   Collection Time: 09/03/15  8:11 PM  Result Value Ref Range   Glucose-Capillary 93 65 - 99 mg/dL   Comment 1 Notify RN   Glucose, capillary     Status: Abnormal   Collection Time: 09/04/15  7:09 AM  Result Value Ref Range   Glucose-Capillary 124 (H) 65 - 99 mg/dL    Physical Findings: AIMS: Facial and Oral Movements Muscles of Facial Expression: None, normal Lips and Perioral Area: None, normal Jaw: None, normal Tongue: None, normal,Extremity Movements Upper (arms, wrists, hands, fingers): None, normal Lower (legs, knees, ankles, toes): None, normal, Trunk Movements Neck, shoulders, hips: None, normal, Overall Severity Severity of abnormal movements (highest score from questions above): None, normal Incapacitation due to abnormal movements: None, normal Patient's awareness of abnormal movements (rate only patient's report): No Awareness, Dental Status Current problems with teeth and/or dentures?: No Does patient usually wear dentures?: No  CIWA:  CIWA-Ar Total: 1 COWS:  COWS Total Score: 1  Musculoskeletal: Strength & Muscle Tone: within normal limits Gait & Station: normal Patient  leans: N/A  Psychiatric Specialty Exam: Review of Systems  Constitutional: Positive for malaise/fatigue. Negative for fever, chills, weight loss and diaphoresis.  HENT: Negative.   Eyes: Negative.   Respiratory: Negative.   Cardiovascular: Negative.   Gastrointestinal: Negative for nausea and constipation.  Genitourinary: Negative.   Musculoskeletal: Negative.   Skin: Negative.   Neurological: Positive for weakness. Negative for dizziness, tingling, tremors, sensory change, speech change, focal weakness, seizures and loss of consciousness.  Endo/Heme/Allergies: Negative.   Psychiatric/Behavioral: Positive for depression and substance abuse. Negative for suicidal ideas, hallucinations and memory loss. The patient is not nervous/anxious and does not have insomnia.     Blood pressure 119/89, pulse 71, temperature 98.7 F (37.1 C), temperature source Oral, resp. rate 20, height 5\' 11"  (1.803 m), weight 94.348 kg (208 lb), SpO2 100 %.Body mass index is 29.02 kg/(m^2).  General Appearance: Disheveled  Eye Contact::  Minimal  Speech:  Slurred  Volume:  Decreased  Mood:  Dysphoric  Affect:  Constricted  Thought Process:  Logical  Orientation:  Full (Time, Place, and Person)  Thought Content:  Hallucinations: None  Suicidal Thoughts:  No  Homicidal Thoughts:  No  Memory:  Immediate;   Fair Recent;   Fair Remote;   Fair  Judgement:  Fair  Insight:  Fair  Psychomotor Activity:  Decreased  Concentration:  Fair  Recall:  NA  Fund of Knowledge:Fair  Language: Fair  Akathisia:  No  Handed:    AIMS (if indicated):     Assets:  Financial Resources/Insurance  ADL's:  Intact  Cognition: WNL  Sleep:  Number of Hours: 9  Treatment Plan Summary: Daily contact with patient to assess and evaluate symptoms and progress in treatment and Medication management   54 year old male with a history of alcohol and cocaine dependence and a questionable history of schizoaffective disorder. The  patient presented to the emergency department in [place] after he overdosed on 3 weeks worth of medication. This patient has had a multitude of visits and hospitalizations in [place] due to poorly controlled hypertension, substance abuse and withdrawals. Clearly this patient is a struggling with maintaining sobriety and will benefit from long-term inpatient substance abuse treatment.  Schizoaffective disorder versus alcohol-induced psychotic disorder: Prior to admission appears patient was prescribed with Abilify 5 low-dose mainly for hyperprolactinemia and Invega sustained at 117 mg per month. Patient also was receiving act team services to [facility]. At this time the diagnosis of schizoaffective disorder is questionable and the need for antipsychotics is unclear. Will not restart invega or abilify.  No evidence of paranoia, delusional thinking, or hallucinations.  Spoke with  with ACT team on 10/13. They report they have not is patient having some delusions mainly related to relationships with family members. No details were provided as to the content of the delusional thinking. They were not aware that the patient had passed hemorrhagic strokes and likely cognitive issues.  Patient has not been sober for significant periods of time he has been using alcohol and crack on an ongoing basis which is likely to explain his past psychotic symptoms.  I do not believe patient suffers from a primary psychotic disorder.  Major depression: Patient has been withdrawn, minimally interactive with peers or staff. States in his room all day lying in bed. I will start him on mirtazapine 15 mg by mouth daily at bedtime.  Alcohol dependence: I we'll discontinue naltrexone as patient has been complaining of severe nausea.  Alcohol withdrawal: resolved  Cerebrovascular disease: History of passive strokes. He has had ischemic and hemorrhagic strokes.  Cognitive disorder: Likely secondary to cerebro vascular  disease  Hypertension: Blood pressure is well controlled. Patient is on a multitude of medications in order to manage his blood pressure. Even though blood pressure is well controlled patient is feeling weak and tired. I will discuss this with hospitalizations see if they have any further recommendations to address these complaints.  The patient is currently on Norvasc 10 mg by mouth twice a day, atenolol 75 mg by mouth twice a day, clonidine 0.3 mg by mouth twice a day, lisinopril 20 mg,  and hydralazine 100 mg q 8 h. every 12 hours. Hospitalist is following up this patient in assisting with blood pressure management.  Diabetes: Diabetes is well-controlled.  Patient will be continued on metformin 1000 mg by mouth twice a day a.m. Amaryl 4 mg by mouth daily. Patient will receive a card-controlled diet. Capillary blood sugar will be check 4 times a day. He will receive supplemental insulin. No changes  Hypercholesterolemia:  continued on simvastatin 20 mg by mouth daily  Daily nausea: Continue Zofran by mouth twice a day.  Patient did not complain of nausea today  GERD: Protonix 40 mg by mouth twice a day  Tobacco use disorder: Continue nicotine patch 21 mg a day  Precautions every 15 minute checks along with withdrawal precautions  Diet: Carb modified a low sodium.  Vital signs will be check every 8 hours  Discharge disposition: [facility] pending.  Plan to discharge to a assisted living facility (pt has had multiple hospitalizations for poorly controlled hypertension, polysubstance abuse, and psychosis (which  in my opinion secondary to substance abuse). Patient is unable to care for himself without supervision due to cognitive issues. Per records there is history of cerebrovascular accidents in the past.  PPD given on 10/13.    Per act team they try to place patient at the beginning of October into a group home but at the last minute the patient refuses as he was concerned about losing his money  and only getting $60 a month.  I discussed this with the patient however he is now willing to go to an assisted-living facility .   Hildred Priest 09/04/2015, 9:48 AM

## 2015-09-04 NOTE — [facility] Group Notes (Signed)
[facility] LCSW Aftercare Discharge Planning Group Note   09/04/2015 4:24 PM  Participation Quality:  Did not attend group   Keene Breath, MSW, LCSWA

## 2015-09-04 NOTE — Consult Note (Signed)
[place] [facility] [place] NAME: [name]    MR#:  [id number]  DATE OF BIRTH:  [birth year]  DATE OF ADMISSION:  08/29/2015  PRIMARY CARE PHYSICIAN: [facility]   REQUESTING/REFERRING PHYSICIAN: Dr Jerilee Hoh  CHIEF COMPLAINT:  Feeling weak and not interested in taking his BP meds  HISTORY OF PRESENT ILLNESS:  [name]  is a 54 year old male with history of hypertension, diabetes and hyperlipidemia and depression with schizoaffective disorder admitted to behavioral medicine unit for depression. Medical consult requested for management of his blood pressure and also diabetes mellitus. Pt has been complaining of nausea and weakness and he is thinking his blood pressure medications is making him feel weak and tired. Patient has been on amlodipine, clonidine, lisinopril, hydralazine and atenolol for his blood pressure. These were his home medications however he had stopped prior to coming to the hospital. He reports feeling weak and tired all the time because of his blood pressure medications. He wants me to stop all his blood pressure medications. Denies any nausea. He is ambulating in the room. He wants to go have his supper.  PAST MEDICAL HISTORY:   Past Medical History  Diagnosis Date  . Drug abuse   . Alcohol abuse   . Diabetes mellitus   . Uncontrolled hypertension   . Arthritis   . Gout   . DTs (delirium tremens) ([place])     history of   . Crack cocaine use   . Noncompliance with medication regimen   . Depression   . Schizophrenia ([place])   . Stroke [facility])     PAST SURGICAL HISTOIRY:   Past Surgical History  Procedure Laterality Date  . Mandible reconstruction      SOCIAL HISTORY:   Social History  Substance Use Topics  . Smoking status: Current Every Day Smoker -- 0.25 packs/day    Types: Cigarettes  . Smokeless tobacco: Never Used  . Alcohol Use: 0.6 - 1.2 oz/week    1-2 Cans of beer  per week     Comment: [facility]    FAMILY HISTORY:   Family History  Problem Relation Age of Onset  . Hypertension      DRUG ALLERGIES:  No Known Allergies  REVIEW OF SYSTEMS:   Review of Systems  Constitutional: Positive for malaise/fatigue. Negative for fever, chills and weight loss.  HENT: Negative for ear discharge, ear pain and nosebleeds.   Eyes: Negative for blurred vision, pain and discharge.  Respiratory: Negative for sputum production, shortness of breath, wheezing and stridor.   Cardiovascular: Negative for chest pain, palpitations, orthopnea and PND.  Gastrointestinal: Negative for nausea, vomiting, abdominal pain and diarrhea.  Genitourinary: Negative for urgency and frequency.  Musculoskeletal: Negative for back pain and joint pain.  Neurological: Positive for weakness. Negative for sensory change, speech change and focal weakness.  Psychiatric/Behavioral: Positive for depression. Negative for hallucinations. The patient is not nervous/anxious.   All other systems reviewed and are negative.     Prior to Admission medications   Medication Sig Start Date End Date Taking? Authorizing Provider  amLODipine (NORVASC) 10 MG tablet Take 1 tablet (10 mg total) by mouth daily. 05/02/15  Yes Niel Hummer, NP  ARIPiprazole (ABILIFY) 5 MG tablet Take 1 tablet (5 mg total) by mouth every evening. Patient taking differently: Take 5 mg by mouth at bedtime.  05/02/15  Yes Niel Hummer, NP  atenolol (TENORMIN) 50 MG tablet Take 100 mg (2  tablets) by mouth in the morning, and 50 mg (1 tablet) by mouth in the evening. 06/04/15  Yes Sherwood Gambler, MD  cloNIDine (CATAPRES) 0.3 MG tablet Take 1 tablet (0.3 mg total) by mouth 2 (two) times daily. 05/02/15  Yes Niel Hummer, NP  glimepiride (AMARYL) 4 MG tablet Take 1 tablet (4 mg total) by mouth daily with breakfast. 05/03/15  Yes Niel Hummer, NP  hydrALAZINE (APRESOLINE) 25 MG tablet Take 1 tablet (25 mg total) by mouth every 12  (twelve) hours. 08/29/15  Yes Costin Karlyne Greenspan, MD  lisinopril (PRINIVIL,ZESTRIL) 20 MG tablet Take 1 tablet (20 mg total) by mouth daily. 05/03/15  Yes Niel Hummer, NP  metFORMIN (GLUCOPHAGE) 1000 MG tablet Take 1 tablet (1,000 mg total) by mouth 2 (two) times daily with a meal. 05/02/15  Yes Niel Hummer, NP  Multiple Vitamin (MULTIVITAMIN WITH MINERALS) TABS tablet Take 1 tablet by mouth daily. 05/02/15  Yes Niel Hummer, NP  naltrexone (DEPADE) 50 MG tablet Take 50 mg by mouth daily.   Yes Historical Provider, MD  naproxen (NAPROSYN) 500 MG tablet Take 1 tablet (500 mg total) by mouth 2 (two) times daily. 06/29/15  Yes Hope Bunnie Pion, NP  Paliperidone Palmitate (INVEGA TRINZA) 410 MG/1.315ML SUSP Inject 410 mg into the muscle every 3 (three) months. Last injection 07/19/15 at [facility]   Yes Historical Provider, MD  simvastatin (ZOCOR) 20 MG tablet Take 1 tablet (20 mg total) by mouth daily at 6 PM. Patient taking differently: Take 20 mg by mouth daily.  05/02/15  Yes Niel Hummer, NP      VITAL SIGNS:  Blood pressure 115/78, pulse 76, temperature 98.4 F (36.9 C), temperature source Oral, resp. rate 20, height 5\' 11"  (1.803 m), weight 94.348 kg (208 lb), SpO2 97 %.  PHYSICAL EXAMINATION:  GENERAL:  54 y.o.-year-old patient lying in the bed with no acute distress. Appears chronically ill for affect EYES: Pupils equal, round, reactive to light and accommodation. No scleral icterus. Extraocular muscles intact.  HEENT: Head atraumatic, normocephalic. Oropharynx and nasopharynx clear.  NECK:  Supple, no jugular venous distention. No thyroid enlargement, no tenderness.  LUNGS: Normal breath sounds bilaterally, no wheezing, rales,rhonchi or crepitation. No use of accessory muscles of respiration.  CARDIOVASCULAR: S1, S2 normal. No murmurs, rubs, or gallops.  ABDOMEN: Soft, nontender, nondistended. Bowel sounds present. No organomegaly or mass.  EXTREMITIES: No pedal edema, cyanosis, or clubbing.   NEUROLOGIC: Cranial nerves II through XII are intact. Muscle strength 5/5 in all extremities. Sensation intact. Gait not checked.  PSYCHIATRIC: The patient is alert and oriented x 2.  SKIN: No obvious rash, lesion, or ulcer.   LABORATORY PANEL:   CBC  Recent Labs Lab 08/29/15 1811  WBC 4.3  HGB 13.6  HCT 41.8  PLT 100*   ------------------------------------------------------------------------------------------------------------------  Chemistries   Recent Labs Lab 08/29/15 1811  NA 137  K 4.0  CL 103  CO2 25  GLUCOSE 243*  BUN 13  CREATININE 0.88  CALCIUM 9.1  AST 127*  ALT 50  ALKPHOS 47  BILITOT 0.2*    IMPRESSION AND PLAN:    54 year old male with history of hypertension, diabetes and hyperlipidemia and depression with schizoaffective disorder admitted to behavioral medicine unit for depression. Medical consult requested for management of his blood pressure and also diabetes mellitus.  #1 history of hypertension- much improved after he has been placed on his oral home meds. Patient however is complaining of feeling weak overall and  he is to bring it to his blood pressure medications. I will try to adjust some of the dosage and try to reduce it to see if that helps with his energy level and weakness. -Continue atenolol, amlodipine, clonidine, lisinopril and hydralazine. -i have decreased dose of all bp meds to 1/2  #2 diabetes mellitus-HbA1c is 7.6. -Continue Amaryl and metformin and sliding scale insulin for now. -Appreciate diabetic nurse coordinator's input  #3 hyperlipidemia-on statin  #4 tobacco use disorder-on nicotine patch.  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 -Naltrexone daily started for alcohol dependence -Currently patient is on trazodone at bedtime  Disposition plan is to send him to assisted living facility    All the records are reviewed and case discussed with  Consulting provider. Management plans discussed with the patient, family and they are in agreement.  CODE STATUS: Full  TOTAL TIME TAKING CARE OF THIS PATIENT: 50 minutes.    Tamsyn Owusu M.D on 09/04/2015 at 6:56 PM  Between 7am to 6pm - Pager - [phone]  After 6pm go to [email or web address] - password [email or web address]  Office  [phone]  CC: Primary care Physician: [place]

## 2015-09-05 LAB — GLUCOSE, CAPILLARY
GLUCOSE-CAPILLARY: 82 mg/dL (ref 65–99)
GLUCOSE-CAPILLARY: 82 mg/dL (ref 65–99)
Glucose-Capillary: 125 mg/dL — ABNORMAL HIGH (ref 65–99)
Glucose-Capillary: 112 mg/dL — ABNORMAL HIGH (ref 65–99)

## 2015-09-05 MED ORDER — HYDRALAZINE HCL 50 MG PO TABS
50.0000 mg | ORAL_TABLET | Freq: Three times a day (TID) | ORAL | Status: DC
  Administered 2015-09-05 – 2015-09-07 (×7): 50 mg via ORAL
  Filled 2015-09-05 (×7): qty 1

## 2015-09-05 MED ORDER — GLIMEPIRIDE 2 MG PO TABS: 2.0000 mg | ORAL_TABLET | Freq: Every day | ORAL | Status: DC

## 2015-09-05 MED ORDER — AMLODIPINE BESYLATE 10 MG PO TABS
10.0000 mg | ORAL_TABLET | Freq: Every day | ORAL | Status: DC
  Administered 2015-09-05 – 2015-09-07 (×3): 10 mg via ORAL
  Filled 2015-09-05 (×2): qty 1

## 2015-09-05 NOTE — Progress Notes (Signed)
Patient ID: [name], male   DOB: 1961/05/14, 54 y.o.   MRN: [id number] [facility] PROGRESS NOTE  PCP: [name]  HPI/Subjective: Patient feeling very weak. States he's been feeling this way for 9 months or so. As outpatient he does drink alcohol. No joint pain or muscle pain. Consult was called for hypertension. His blood pressure this morning was a little bit low I tried to cut back on medication. Last blood pressure elevated so meds had a be restarted. Patient walked to the consultation room. Answered all questions appropriately.  Objective: Filed Vitals:   09/05/15 0700  BP: 151/103  Pulse: 73  Temp: 98.3 F (36.8 C)  Resp: 20    Filed Weights   08/29/15 1500  Weight: 94.348 kg (208 lb)    ROS: Review of Systems  Constitutional: Positive for malaise/fatigue. Negative for fever and chills.  Eyes: Negative for blurred vision.  Respiratory: Negative for cough and shortness of breath.   Cardiovascular: Negative for chest pain.  Gastrointestinal: Negative for nausea, vomiting, abdominal pain, diarrhea and constipation.  Genitourinary: Negative for dysuria.  Musculoskeletal: Negative for joint pain.  Neurological: Positive for weakness. Negative for dizziness and headaches.   Exam: Physical Exam  HENT:  Nose: No mucosal edema.  Mouth/Throat: No oropharyngeal exudate or posterior oropharyngeal edema.  Eyes: Conjunctivae, EOM and lids are normal. Pupils are equal, round, and reactive to light.  Neck: No JVD present. Carotid bruit is not present. No edema present. No thyroid mass and no thyromegaly present.  Cardiovascular: S1 normal and S2 normal.  Exam reveals no gallop.   No murmur heard. Pulses:      Dorsalis pedis pulses are 2+ on the right side, and 2+ on the left side.  Respiratory: No respiratory distress. He has no wheezes. He has no rhonchi. He has no rales.  GI: Soft. Bowel sounds are normal. There is no tenderness.   Musculoskeletal:       Right ankle: He exhibits swelling.       Left ankle: He exhibits swelling.  Lymphadenopathy:    He has no cervical adenopathy.  Neurological: He is alert. No cranial nerve deficit.  Skin: Skin is warm. No rash noted. Nails show no clubbing.  Psychiatric: He has a normal mood and affect.    Data Reviewed: Basic Metabolic Panel:  Recent Labs Lab 08/29/15 1811  NA 137  K 4.0  CL 103  CO2 25  GLUCOSE 243*  BUN 13  CREATININE 0.88  CALCIUM 9.1   Liver Function Tests:  Recent Labs Lab 08/29/15 1811  AST 127*  ALT 50  ALKPHOS 61  BILITOT 0.2*  PROT 7.2  ALBUMIN 3.8   CBC:  Recent Labs Lab 08/29/15 1811  WBC 4.3  HGB 13.6  HCT 41.8  MCV 92.1  PLT 100*    CBG:  Recent Labs Lab 09/04/15 1210 09/04/15 1712 09/04/15 2101 09/05/15 0640 09/05/15 1211  GLUCAP 136* 94 92 125* 112*    Recent Results (from the past 240 hour(s))  Culture, blood (x 2)     Status: None   Collection Time: 08/27/15 12:55 AM  Result Value Ref Range Status   Specimen Description BLOOD RIGHT HAND  Final   Special Requests BOTTLES DRAWN AEROBIC ONLY 10CC  Final   Culture NO GROWTH 5 DAYS  Final   Report Status 09/01/2015 FINAL  Final  Culture, blood (x 2)     Status: None   Collection Time: 08/27/15  1:05 AM  Result Value Ref Range Status   Specimen Description BLOOD LEFT ARM  Final   Special Requests BOTTLES DRAWN AEROBIC AND ANAEROBIC 5CC  Final   Culture NO GROWTH 5 DAYS  Final   Report Status 09/01/2015 FINAL  Final      Scheduled Meds: . amLODipine  10 mg Oral Daily  . atenolol  50 mg Oral BID  . benzocaine   Mouth/Throat TID AC & HS  . cloNIDine  0.1 mg Oral BID  . feeding supplement (GLUCERNA SHAKE)  237 mL Oral TID BM  . [START ON 09/06/2015] glimepiride  2 mg Oral Q breakfast  . hydrALAZINE  50 mg Oral 3 times per day  . insulin aspart  0-15 Units Subcutaneous TID WC  . insulin aspart  0-5 Units Subcutaneous QHS  . lisinopril  10 mg Oral  Daily  . metFORMIN  1,000 mg Oral BID WC  . mirtazapine  15 mg Oral QHS  . nicotine  21 mg Transdermal Q0600  . ondansetron  4 mg Oral Q12H  . pantoprazole  40 mg Oral BID AC  . senna  1 tablet Oral QHS    Assessment/Plan:  1. Accelerated hypertension. This morning blood pressure is a little bit low so I figured I can try to cut back on medications. Most recent blood pressure was elevated I restarted the Norvasc. Continue to trend blood pressure. 2. Type 2 diabetes mellitus- hemoglobin A1c 7.6. Less for fingersticks are all good. Hopefully can cut back on the Amaryl to 2 mg daily since the patient is not drinking. 3. Weakness. Hold statin at this point. Patient must stop drinking. 4. Alcohol abuse- patient must stop drinking. 5. Tobacco abuse- smoking cessation discussed by me.   Disposition Plan: Potentially out to assisted living soon.  Time spent: 20 minutes  Loletha Grayer  [facility]

## 2015-09-05 NOTE — [facility] Group Notes (Signed)
[facility] Group Notes:  (Nursing/MHT/Case Management/Adjunct)  Date:  09/05/2015  Time:  2:12 PM  Type of Therapy:  Psychoeducational Skills  Participation Level:  Did Not Attend    Drake Leach 09/05/2015, 2:12 PM

## 2015-09-05 NOTE — Clinical Social Work Note (Signed)
CSW faxed patient to [facility] [phone] and [facility] in [place]  who both have beds available and plan to visit patient today. Izora Ribas is the representative at [facility] to speak with. Mariann Laster from [facility] in [place] will be visiting patient this evening.

## 2015-09-05 NOTE — Progress Notes (Signed)
Patient with depressed affect and withdrawn behavior. No SI/HI at this time. Patient sleepy in bed and with minimal interaction with peers. States to Probation officer "I am not going to take all these meds when I leave here". Educated patient on importance of managing diabetes, hypertension and risks of alcoholism with little effect. Encouraged therapy groups to learn and initiate coping skills for management of stressors and diagnosis with little effect. Safety maintained.

## 2015-09-05 NOTE — Progress Notes (Signed)
Writer phoned lab and lab able to add on urine culture to urine sample sent today. Urine culture results pending.

## 2015-09-05 NOTE — Progress Notes (Signed)
Recreation Therapy Notes  Date: 10.18.16 Time: 3:00 pm Location: Craft Room  Group Topic: Goal Setting  Goal Area(s) Addresses:  Patient will write one goal. Patient will verbalize benefit of setting goals.  Behavioral Response: Did not attend  Intervention: Step By Step  Activity: Patients were given a worksheet with a foot on it. Patients were instructed to write a goal on the inside of the foot and write positive words on the outside of the foot.  Education: LRT educated patients on ways the can keep themselves focused on their goals.  Education Outcome: Patient did not attend group.  Clinical Observations/Feedback: Patient did not attend group.  [name], LRT/CTRS 09/05/2015 4:06 PM

## 2015-09-05 NOTE — Tx Team (Signed)
Interdisciplinary Treatment Plan Update (Adult)  Date:  09/05/2015 Time Reviewed:  3:31 PM  Progress in Treatment: Attending groups: No. Participating in groups:  No. Taking medication as prescribed:  Yes. Tolerating medication:  Yes. Family/Significant othe contact made:  No, will contact:  if patient provides consent Patient understands diagnosis:  Yes. Discussing patient identified problems/goals with staff:  Yes. Medical problems stabilized or resolved:  Yes. Denies suicidal/homicidal ideation: Yes. Issues/concerns per patient self-inventory:  No. Other:  New problem(s) identified: No, Describe:  none reported  Discharge Plan or Barriers: Patient had a breakup with his girlfriend after an argument and is now homeless and seeking ALF placement as patient was released for substance abuse inpatient txt at [facility] in [place] due to his chronic high blood pressure. Patient will stabilize on meds to address his depression, SI, HI and hallucinations and will need housing at discharge but is connected with [name].   Reason for Continuation of Hospitalization: Depression Hallucinations Homicidal ideation Suicidal ideation  Comments:  Estimated length of stay: 1 day with expected discharge by Wednesday 09/06/15  New goal(s):  Review of initial/current patient goals per problem list:   1.  Goal(s): participate in care plan  Met:  Yes  2.  Goal (s): eliminate SI/HI  Met:  Yes  3.  Goal(s):hallucinations management by discharge  Met:  Yes  4.  Goal(s): decrease depression  Met:  No  Target date: 09/06/15  As evidenced [id number] self report  Attendees: Physician:  Merlyn Albert, MD 10/18/20163:31 PM  Nursing:   Carolynn Sayers, RN 10/18/20163:31 PM  Other:  Carmell Austria, [place] 10/18/20163:31 PM  Other:   10/18/20163:31 PM  Other:   10/18/20163:31 PM  Other:  10/18/20163:31 PM  Other:  10/18/20163:31 PM  Other:  10/18/20163:31 PM  Other:  10/18/20163:31  PM  Other:  10/18/20163:31 PM  Other:  10/18/20163:31 PM  Other:   10/18/20163:31 PM   Scribe for Treatment Team:   Keene Breath, MSW, [place]  09/05/2015, 3:31 PM

## 2015-09-05 NOTE — Progress Notes (Signed)
[facility] MD Progress Note  09/05/2015 10:11 AM Cobain Krikorian  MRN:  [id number] Subjective:  Patient says he feels less tired and weak since his blood pressure medications were decreased yesterday. He told last night to his nurse that he was not planning on taking any medications for blood pressure after discharge because they keep making him sick. Patient continues to be withdrawn to his room and stays in bed most of the day, minimal participation in programming.  Patient denies suicidality, homicidality or having auditory or visual hallucinations. As far as side effects he continues to complain of weakness but is no longer reporting nausea or vomiting. He denies major physical complaints today other than his lack of energy. Mood is depressed, appetite is low, energy is low, concentration is low. Denies problems with insomnia.  Per nursing: Patient with depressed affect and withdrawn behavior. No SI/HI at this time. Patient sleepy in bed and with minimal interaction with peers. States to Probation officer "I am not going to take all these meds when I leave here". Educated patient on importance of managing diabetes, hypertension and risks of alcoholism with little effect. Encouraged therapy groups to learn and initiate coping skills for management of stressors and diagnosis with little effect. Safety maintained.  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 Cerebrovascular disease [I67.9] 09/02/2015  . 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 Malignant hypertension [I10] 08/30/2015  . Alcohol use disorder, severe, dependence ([place]) [F10.20] 08/30/2015  . Alcohol withdrawal ([place]) [F10.239] 08/30/2015  . 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 Thrombocytopenia ([place]) [D69.6] 08/27/2015  . Tobacco use disorder [F17.200] 08/26/2015  . Hyperprolactinemia ([place]) [E22.1] 04/29/2015  . Hyperlipidemia [E78.5] 04/28/2015  . Cocaine use disorder, severe, dependence ([place]) [F14.20] 04/27/2015  . Gout [M10.9] 08/09/2013  . Diabetes mellitus ([place]) [E11.9] 07/09/2012   Total Time spent with patient: 30 minutes  Past Psychiatric History: Patient has been diagnosed with schizoaffective disorder, polysubstance abuse and has been receiving outpatient medication management from [facility] and also follow with the ACT team.  Patient reports multiple psychiatric hospitalizations mainly at behavioral health in [place] but has also been hospitalized in [facility] regional. The patient reports a multitude of suicidal attempts throughout his life mainly by overdose.  Risk to Self: Is patient at risk for suicide?: No Risk to Others:  no Prior Inpatient Therapy:  yes Prior Outpatient Therapy:  yes  Alcohol Screening: 1. How often do you have a drink containing alcohol?: 4 or more times a week 2. How many drinks containing alcohol do you have on a typical day when you are drinking?: 3 or 4 3. How often do you have six or more drinks on one occasion?: Weekly Preliminary Score: 4 4. How often during the last year have you found that you were not able to stop drinking once you had started?: Never 5. How often during the last year have you failed to do what was normally expected from you becasue of drinking?: Never 6. How often during the last year have you needed a first drink in the morning to get yourself going after a heavy drinking session?: Never 7. How often during the last year have you had a feeling of guilt of remorse after drinking?: Never 8. How often during the last year have you been  unable to remember what happened the night before because you had been drinking?: Never 9. Have you or someone else been injured as a result of your  drinking?: No 10. Has a relative or friend or a doctor or another health worker been concerned about your drinking or suggested you cut down?: No Alcohol Use Disorder Identification Test Final Score (AUDIT): 8 Brief Intervention: Patient declined brief intervention Substance Abuse History in the last 12 months: Yes.  Consequences of Substance Abuse: Medical Consequences: poor compliance, worsening mood. Previous Psychotropic Medications: Yes  Psychological Evaluations: No   Past Medical History: Patient follows up at [facility] in [place]. Past Medical History  Diagnosis Date  . Drug abuse   . Alcohol abuse   . Diabetes mellitus   . Uncontrolled hypertension   . Arthritis   . Gout   . DTs (delirium tremens) ([place])     history of   . Crack cocaine use   . Noncompliance with medication regimen   . Depression   . Schizophrenia ([place])   . Stroke [facility])     Past Surgical History  Procedure Laterality Date  . Mandible reconstruction     Family History:  Family History  Problem Relation Age of Onset  . Hypertension     Family Psychiatric History: Patient reports that his mother was "mental". Denies history of suicidal attempts in his family. Denies history of substance abuse.  Social History: Patient reports he has been receiving disability for the last 8 years for mental health issues. He is currently separated from his wife. He has 7 children were grown. He has on and off contact with them. For the last 3 years he has been living with his girlfriend but is states he does not want to return and live with her. She pressed charges on him a few months ago and he has a court hearing on October 20 for assault on a male. As far as his legal history patient states he's been in prison 3 times. His last hospitalization and longest was from 1990-1994. The patient does not want to disclose what his charges were but denies being  a registered sex offender. He has a daughter who is 55 years old lives in [place] and is hoping he can go and stay with her.  History  Alcohol Use  . 0.6 - 1.2 oz/week  . 1-2 Cans of beer per week    Comment: [facility]    History  Drug Use  . Yes  . Special: "Crack" cocaine, Cocaine    Social History   Social History  . Marital Status: Legally Separated    Spouse Name: N/A  . Number of Children: N/A  . Years of Education: N/A   Social History Main Topics  . Smoking status: Current Every Day Smoker -- 0.25 packs/day    Types: Cigarettes  . Smokeless tobacco: Never Used  . Alcohol Use: 0.6 - 1.2 oz/week    1-2 Cans of beer per week     Comment: [facility]  . Drug Use: Yes    Special: "Crack" cocaine, Cocaine  . Sexual Activity: Not Asked   Other Topics Concern  . None   Social History Narrative        Sleep: Good  Appetite:  Good  Current Medications: Current Facility-Administered Medications  Medication Dose Route Frequency Provider Last Rate Last Dose  . acetaminophen (TYLENOL) tablet 650 mg  650 mg Oral Q6H PRN Clovis Fredrickson, MD  650 mg at 09/03/15 1655  . alum & mag hydroxide-simeth (MAALOX/MYLANTA) 200-200-20 MG/5ML suspension 30 mL  30 mL Oral Q4H PRN Jolanta B Pucilowska, MD      . atenolol (TENORMIN) tablet 50 mg  50 mg Oral BID Fritzi Mandes, MD   50 mg at 09/05/15 0808  . benzocaine (ORAJEL) 10 % mucosal gel   Mouth/Throat TID AC & HS Hildred Priest, MD      . cloNIDine (CATAPRES) tablet 0.1 mg  0.1 mg Oral BID Fritzi Mandes, MD   0.1 mg at 09/05/15 0809  . feeding supplement (GLUCERNA SHAKE) (GLUCERNA SHAKE) liquid 237 mL  237 mL Oral TID BM Hildred Priest, MD   237 mL at 09/05/15 0811  . glimepiride (AMARYL) tablet 4 mg  4 mg Oral Q breakfast Clovis Fredrickson, MD   4 mg at 09/05/15 0808  . hydrALAZINE (APRESOLINE) tablet 50 mg  50 mg Oral 3  times per day Hildred Priest, MD      . ibuprofen (ADVIL,MOTRIN) tablet 600 mg  600 mg Oral Q6H PRN Hildred Priest, MD      . insulin aspart (novoLOG) injection 0-15 Units  0-15 Units Subcutaneous TID WC Max Sane, MD   2 Units at 09/05/15 0806  . insulin aspart (novoLOG) injection 0-5 Units  0-5 Units Subcutaneous QHS Max Sane, MD   0 Units at 08/30/15 2338  . lisinopril (PRINIVIL,ZESTRIL) tablet 10 mg  10 mg Oral Daily Fritzi Mandes, MD   10 mg at 09/05/15 0809  . magnesium hydroxide (MILK OF MAGNESIA) suspension 30 mL  30 mL Oral Daily PRN Jolanta B Pucilowska, MD      . metFORMIN (GLUCOPHAGE) tablet 1,000 mg  1,000 mg Oral BID WC Jolanta B Pucilowska, MD   1,000 mg at 09/05/15 0809  . mirtazapine (REMERON) tablet 15 mg  15 mg Oral QHS Hildred Priest, MD   15 mg at 09/04/15 2152  . nicotine (NICODERM CQ - dosed in mg/24 hours) patch 21 mg  21 mg Transdermal Q0600 Clovis Fredrickson, MD   21 mg at 09/02/15 0650  . ondansetron (ZOFRAN) tablet 4 mg  4 mg Oral Q12H Hildred Priest, MD   4 mg at 09/05/15 0811  . pantoprazole (PROTONIX) EC tablet 40 mg  40 mg Oral BID AC Hildred Priest, MD   40 mg at 09/05/15 0810  . senna (SENOKOT) tablet 8.6 mg  1 tablet Oral QHS Hildred Priest, MD   8.6 mg at 09/04/15 2152  . simvastatin (ZOCOR) tablet 20 mg  20 mg Oral q1800 Jolanta B Pucilowska, MD   20 mg at 09/04/15 1728    Lab Results:  Results for orders placed or performed during the hospital encounter of 08/29/15 (from the past 48 hour(s))  Glucose, capillary     Status: Abnormal   Collection Time: 09/03/15 12:19 PM  Result Value Ref Range   Glucose-Capillary 135 (H) 65 - 99 mg/dL  Glucose, capillary     Status: Abnormal   Collection Time: 09/03/15  4:52 PM  Result Value Ref Range   Glucose-Capillary 106 (H) 65 - 99 mg/dL  Glucose, capillary     Status: None   Collection Time: 09/03/15  8:11 PM  Result Value Ref Range    Glucose-Capillary 93 65 - 99 mg/dL   Comment 1 Notify RN   Glucose, capillary     Status: Abnormal   Collection Time: 09/04/15  7:09 AM  Result Value Ref Range   Glucose-Capillary 124 (H) 65 -  99 mg/dL  Glucose, capillary     Status: Abnormal   Collection Time: 09/04/15 12:10 PM  Result Value Ref Range   Glucose-Capillary 136 (H) 65 - 99 mg/dL   Comment 1 Notify RN   Glucose, capillary     Status: None   Collection Time: 09/04/15  5:12 PM  Result Value Ref Range   Glucose-Capillary 94 65 - 99 mg/dL  Glucose, capillary     Status: None   Collection Time: 09/04/15  9:01 PM  Result Value Ref Range   Glucose-Capillary 92 65 - 99 mg/dL  Glucose, capillary     Status: Abnormal   Collection Time: 09/05/15  6:40 AM  Result Value Ref Range   Glucose-Capillary 125 (H) 65 - 99 mg/dL   Comment 1 Notify RN     Physical Findings: AIMS: Facial and Oral Movements Muscles of Facial Expression: None, normal Lips and Perioral Area: None, normal Jaw: None, normal Tongue: None, normal,Extremity Movements Upper (arms, wrists, hands, fingers): None, normal Lower (legs, knees, ankles, toes): None, normal, Trunk Movements Neck, shoulders, hips: None, normal, Overall Severity Severity of abnormal movements (highest score from questions above): None, normal Incapacitation due to abnormal movements: None, normal Patient's awareness of abnormal movements (rate only patient's report): No Awareness, Dental Status Current problems with teeth and/or dentures?: No Does patient usually wear dentures?: No  CIWA:  CIWA-Ar Total: 1 COWS:  COWS Total Score: 1  Musculoskeletal: Strength & Muscle Tone: within normal limits Gait & Station: normal Patient leans: N/A  Psychiatric Specialty Exam: Review of Systems  Constitutional: Positive for malaise/fatigue. Negative for fever, chills, weight loss and diaphoresis.  HENT: Negative.   Eyes: Negative.   Respiratory: Negative.   Cardiovascular: Negative.    Gastrointestinal: Negative for nausea and constipation.  Genitourinary: Negative.   Musculoskeletal: Negative.   Skin: Negative.   Neurological: Positive for weakness. Negative for dizziness, tingling, tremors, sensory change, speech change, focal weakness, seizures and loss of consciousness.  Endo/Heme/Allergies: Negative.   Psychiatric/Behavioral: Positive for depression and substance abuse. Negative for suicidal ideas, hallucinations and memory loss. The patient is not nervous/anxious and does not have insomnia.     Blood pressure 151/103, pulse 73, temperature 98.3 F (36.8 C), temperature source Oral, resp. rate 20, height 5\' 11"  (1.803 m), weight 94.348 kg (208 lb), SpO2 97 %.Body mass index is 29.02 kg/(m^2).  General Appearance: Disheveled  Eye Contact::  Minimal  Speech:  Slurred  Volume:  Decreased  Mood:  Dysphoric  Affect:  Constricted  Thought Process:  Logical  Orientation:  Full (Time, Place, and Person)  Thought Content:  Hallucinations: None  Suicidal Thoughts:  No  Homicidal Thoughts:  No  Memory:  Immediate;   Fair Recent;   Fair Remote;   Fair  Judgement:  Fair  Insight:  Fair  Psychomotor Activity:  Decreased  Concentration:  Fair  Recall:  NA  Fund of Knowledge:Fair  Language: Fair  Akathisia:  No  Handed:    AIMS (if indicated):     Assets:  Financial Resources/Insurance  ADL's:  Intact  Cognition: WNL  Sleep:  Number of Hours: 7   Treatment Plan Summary: Daily contact with patient to assess and evaluate symptoms and progress in treatment and Medication management   54 year old male with a history of alcohol and cocaine dependence and a questionable history of schizoaffective disorder. The patient presented to the emergency department in [place] after he overdosed on 3 weeks worth of medication. This patient has had a  multitude of visits and hospitalizations in [place] due to poorly controlled hypertension, substance abuse and withdrawals.  Clearly this patient is a struggling with maintaining sobriety and will benefit from long-term inpatient substance abuse treatment.  Schizoaffective disorder versus alcohol-induced psychotic disorder: Prior to admission appears patient was prescribed with Abilify 5 low-dose mainly for hyperprolactinemia and Invega sustained at 117 mg per month. Patient also was receiving act team services to [facility]. At this time the diagnosis of schizoaffective disorder is questionable and the need for antipsychotics is unclear. Will not restart invega or abilify.  No evidence of paranoia, delusional thinking, or hallucinations.  Spoke with  with ACT team on 10/13. They report they have not is patient having some delusions mainly related to relationships with family members. No details were provided as to the content of the delusional thinking. They were not aware that the patient had passed hemorrhagic strokes and likely cognitive issues.  Patient has not been sober for significant periods of time he has been using alcohol and crack on an ongoing basis which is likely to explain his past psychotic symptoms.  I do not believe patient suffers from a primary psychotic disorder.  Major depression: Continue mirtazapine 15 mg daily at bedtime.  Alcohol dependence: Patient was prescribed with naltrexone prior to admission. This medication has been discontinued as patient has been complaining of having to take too many medications and having nausea and weakness.  Alcohol withdrawal: resolved  Cerebrovascular disease: History of passive strokes. He has had ischemic and hemorrhagic strokes.  Cognitive disorder: Likely secondary to cerebro vascular disease  Hypertension: Hospitalist is assisting with blood pressure management. Patient's blood pressure is being difficult to control so he does require a multitude of medications. However the patient is complaining now having multiple side effects such as weakness, nausea and  vomiting. Hospitalist was consulted yesterday and they decrease blood pressure medicine. Unfortunately this morning again diastolic blood pressure is high.  For now I will restart hydralazine at 50 mg 3 times a day and discuss case with hospitalist.  Diabetes: Diabetes is well-controlled.  Patient will be continued on metformin 1000 mg by mouth twice a day a.m. Amaryl 4 mg by mouth daily. Patient will receive a card-controlled diet. Capillary blood sugar will be check 4 times a day. He will receive supplemental insulin. No changes  Hypercholesterolemia:  continued on simvastatin 20 mg by mouth daily  Daily nausea: Continue Zofran by mouth twice a day.    GERD: Protonix 40 mg by mouth twice a day  Tobacco use disorder: Continue nicotine patch 21 mg a day  Precautions every 15 minute checks along with withdrawal precautions  Diet: Carb modified a low sodium.  Vital signs will be check every 8 hours  Discharge disposition: PSASAR pending (interviewed completed yesterday). Plan to discharge to a assisted living facility (pt has had multiple hospitalizations for poorly controlled hypertension, polysubstance abuse, and psychosis (which in my opinion secondary to substance abuse). Patient is unable to care for himself without supervision due to cognitive issues. Per records there is history of cerebrovascular accidents in the past.  PPD given on 10/13.    Per act team they try to place patient at the beginning of October into a group home but at the last minute the patient refuses as he was concerned about losing his money and only getting $60 a month.  I discussed this with the patient however he is now willing to go to an assisted-living facility .   Hernandez-Gonzalez,  Seth Bake 09/05/2015, 10:11 AM

## 2015-09-05 NOTE — Progress Notes (Signed)
Patient refusing Orajel and Nicotene patch. Blood glucose monitored with insulin as ordered. No s/s of hypo/hyperglycemia. Safety maintained.

## 2015-09-05 NOTE — BHH Group Notes (Signed)
[facility] LCSW Group Therapy  09/05/2015 3:04 PM  Type of Therapy:  Group Therapy  Participation Level:  Did Not Attend   Keene Breath, MSW, LCSWA 09/05/2015, 3:04 PM

## 2015-09-06 LAB — GLUCOSE, CAPILLARY
GLUCOSE-CAPILLARY: 94 mg/dL (ref 65–99)
Glucose-Capillary: 130 mg/dL — ABNORMAL HIGH (ref 65–99)
Glucose-Capillary: 112 mg/dL — ABNORMAL HIGH (ref 65–99)
Glucose-Capillary: 70 mg/dL (ref 65–99)

## 2015-09-06 MED ORDER — GLIMEPIRIDE 2 MG PO TABS
1.0000 mg | ORAL_TABLET | Freq: Every day | ORAL | Status: DC
  Administered 2015-09-06: 1 mg via ORAL
  Filled 2015-09-06: qty 2

## 2015-09-06 NOTE — BHH Group Notes (Signed)
[place] Group Notes:  (Nursing/MHT/Case Management/Adjunct)  Date:  09/06/2015  Time:  2:55 PM  Type of Therapy:  Psychoeducational Skills  Participation Level:  Did Not Attend   Adela Lank [facility] 09/06/2015, 2:55 PM

## 2015-09-06 NOTE — Progress Notes (Signed)
Patient ID: [name], male   DOB: 1960-12-19, 54 y.o.   MRN: [id number] [facility] PROGRESS NOTE  PCP: [name] OF CARE  HPI/Subjective: Patient feeling much better today. Less fatigued. He states that he may be leaving the hospital today. Offers no complaints. He states appetite is good.  Objective: Filed Vitals:   09/06/15 0712  BP: 139/98  Pulse: 64  Temp: 98.5 F (36.9 C)  Resp: 20    Filed Weights   08/29/15 1500  Weight: 94.348 kg (208 lb)    ROS: Review of Systems  Constitutional: Negative for fever, chills and malaise/fatigue.  Eyes: Negative for blurred vision.  Respiratory: Negative for cough and shortness of breath.   Cardiovascular: Negative for chest pain.  Gastrointestinal: Negative for nausea, vomiting, abdominal pain, diarrhea and constipation.  Genitourinary: Negative for dysuria.  Musculoskeletal: Negative for joint pain.  Neurological: Negative for dizziness, weakness and headaches.   Exam: Physical Exam  HENT:  Nose: No mucosal edema.  Mouth/Throat: No oropharyngeal exudate or posterior oropharyngeal edema.  Eyes: Conjunctivae, EOM and lids are normal. Pupils are equal, round, and reactive to light.  Neck: No JVD present. Carotid bruit is not present. No edema present. No thyroid mass and no thyromegaly present.  Cardiovascular: S1 normal and S2 normal.  Exam reveals no gallop.   No murmur heard. Pulses:      Dorsalis pedis pulses are 2+ on the right side, and 2+ on the left side.  Respiratory: No respiratory distress. He has no wheezes. He has no rhonchi. He has no rales.  GI: Soft. Bowel sounds are normal. There is no tenderness.  Musculoskeletal:       Right ankle: He exhibits swelling.       Left ankle: He exhibits swelling.  Lymphadenopathy:    He has no cervical adenopathy.  Neurological: He is alert. No cranial nerve deficit.  Skin: Skin is warm. No rash noted. Nails show no clubbing.  Psychiatric: He  has a normal mood and affect.    Data Reviewed:  CBG:  Recent Labs Lab 09/05/15 1211 09/05/15 1657 09/05/15 2035 09/06/15 0639 09/06/15 1158  GLUCAP 112* 82 82 130* 112*    Scheduled Meds: . amLODipine  10 mg Oral Daily  . atenolol  50 mg Oral BID  . benzocaine   Mouth/Throat TID AC & HS  . cloNIDine  0.1 mg Oral BID  . feeding supplement (GLUCERNA SHAKE)  237 mL Oral TID BM  . hydrALAZINE  50 mg Oral 3 times per day  . insulin aspart  0-15 Units Subcutaneous TID WC  . insulin aspart  0-5 Units Subcutaneous QHS  . lisinopril  10 mg Oral Daily  . metFORMIN  1,000 mg Oral BID WC  . mirtazapine  15 mg Oral QHS  . nicotine  21 mg Transdermal Q0600  . ondansetron  4 mg Oral Q12H  . pantoprazole  40 mg Oral BID AC  . senna  1 tablet Oral QHS    Assessment/Plan:  1. Accelerated hypertension. Blood pressure variable but overall better controlled than yesterday. Restarted the Norvasc. 2. Type 2 diabetes mellitus- hemoglobin A1c 7.6. All fingersticks on the lower side. The patient was given Amaryl 1 mg today. I may be able to get rid of this medication completely if the patient continues to not drink alcohol. Can continue metformin. 3. Weakness. Patient feeling stronger today. Continue to hold statin at this point. Patient must stop drinking. 4. Alcohol abuse- patient must stop drinking.  5. Tobacco abuse- smoking cessation discussed by me.   Disposition Plan: Potentially out to assisted living soon.  Time spent: 20 minutes  Loletha Grayer  [facility]

## 2015-09-06 NOTE — Progress Notes (Signed)
Patient with depressed affect, withdrawn behavior, sleepy in bed. No SI/HI at this time. Good appetite, fair adls. Not actively participating in plan of care. No SI/HI at this time.

## 2015-09-06 NOTE — Progress Notes (Signed)
[facility] MD Progress Note  09/06/2015 3:38 PM [name]  MRN:  [id number] Subjective:  Patient says he feels less tired today since his BP medications were decreased. He is seen with improved mood and affect.  Denies SI, HI or A/VH. Denies SE from meds.  Denies physical complaints. SW has found a ALF willing to take him.  PSARR is still pending.  Per nursing: Patient with depressed affect, low energy, low interest in plan of care. Fair adls, good appetite. Fair eye contact, appropriate when nurse initiates interaction. Not actively participating in plan of care. Withdrawn and resting in bed. No SI/HI at this time. Encouraged to attend therapy groups to learn and initiate coping skills for management of stressors and diagnosis. Safety maintained.   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 Cerebrovascular disease [I67.9] 09/02/2015  . 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 Malignant hypertension [I10] 08/30/2015  . Alcohol use disorder, severe, dependence ([place]) [F10.20] 08/30/2015  . Alcohol withdrawal ([place]) [F10.239] 08/30/2015  . 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 Thrombocytopenia ([place]) [D69.6] 08/27/2015  . Tobacco use disorder [F17.200] 08/26/2015  . Hyperprolactinemia ([place]) [E22.1] 04/29/2015  . Hyperlipidemia [E78.5] 04/28/2015  . Cocaine use disorder, severe, dependence ([place]) [F14.20] 04/27/2015  . Gout [M10.9] 08/09/2013  . Diabetes mellitus ([place]) [E11.9] 07/09/2012   Total Time spent with patient: 30 minutes  Past Psychiatric History: Patient has been diagnosed with schizoaffective disorder, polysubstance abuse and has been receiving outpatient medication management from [facility] and  also follow with the ACT team.  Patient reports multiple psychiatric hospitalizations mainly at behavioral health in [place] but has also been hospitalized in [facility] regional. The patient reports a multitude of suicidal attempts throughout his life mainly by overdose.  Risk to Self: Is patient at risk for suicide?: No Risk to Others:  no Prior Inpatient Therapy:  yes Prior Outpatient Therapy:  yes  Alcohol Screening: 1. How often do you have a drink containing alcohol?: 4 or more times a week 2. How many drinks containing alcohol do you have on a typical day when you are drinking?: 3 or 4 3. How often do you have six or more drinks on one occasion?: Weekly Preliminary Score: 4 4. How often during the last year have you found that you were not able to stop drinking once you had started?: Never 5. How often during the last year have you failed to do what was normally expected from you becasue of drinking?: Never 6. How often during the last year have you needed a first drink in the morning to get yourself going after a heavy drinking session?: Never 7. How often during the last year have you had a feeling of guilt of remorse after drinking?: Never 8. How often during the last year have you been unable to remember what happened the night before because you had been drinking?: Never 9. Have you or someone else been injured as a result of your drinking?: No 10. Has a relative or friend or a doctor or another health worker been concerned about your drinking or suggested you cut down?: No Alcohol Use Disorder Identification Test Final Score (AUDIT): 8 Brief Intervention: Patient declined brief intervention Substance Abuse History in the last 12 months: Yes.  Consequences of Substance Abuse: Medical Consequences:  poor compliance, worsening mood. Previous Psychotropic Medications: Yes  Psychological Evaluations: No   Past Medical History: Patient follows up at [facility] in  [place]. Past Medical History  Diagnosis Date  . Drug abuse   . Alcohol abuse   . Diabetes mellitus   . Uncontrolled hypertension   . Arthritis   . Gout   . DTs (delirium tremens) ([place])     history of   . Crack cocaine use   . Noncompliance with medication regimen   . Depression   . Schizophrenia ([place])   . Stroke [facility])     Past Surgical History  Procedure Laterality Date  . Mandible reconstruction     Family History:  Family History  Problem Relation Age of Onset  . Hypertension     Family Psychiatric History: Patient reports that his mother was "mental". Denies history of suicidal attempts in his family. Denies history of substance abuse.  Social History: Patient reports he has been receiving disability for the last 8 years for mental health issues. He is currently separated from his wife. He has 7 children were grown. He has on and off contact with them. For the last 3 years he has been living with his girlfriend but is states he does not want to return and live with her. She pressed charges on him a few months ago and he has a court hearing on October 20 for assault on a male. As far as his legal history patient states he's been in prison 3 times. His last hospitalization and longest was from 1990-1994. The patient does not want to disclose what his charges were but denies being a registered sex offender. He has a daughter who is 34 years old lives in [place] and is hoping he can go and stay with her.  History  Alcohol Use  . 0.6 - 1.2 oz/week  . 1-2 Cans of beer per week    Comment: [facility]    History  Drug Use  . Yes  . Special: "Crack" cocaine, Cocaine    Social History   Social History  . Marital Status: Legally Separated    Spouse Name: N/A  . Number of Children: N/A  . Years of Education: N/A   Social History Main Topics  . Smoking status:  Current Every Day Smoker -- 0.25 packs/day    Types: Cigarettes  . Smokeless tobacco: Never Used  . Alcohol Use: 0.6 - 1.2 oz/week    1-2 Cans of beer per week     Comment: [facility]  . Drug Use: Yes    Special: "Crack" cocaine, Cocaine  . Sexual Activity: Not Asked   Other Topics Concern  . None   Social History Narrative        Sleep: Good  Appetite:  Good  Current Medications: Current Facility-Administered Medications  Medication Dose Route Frequency Provider Last Rate Last Dose  . acetaminophen (TYLENOL) tablet 650 mg  650 mg Oral Q6H PRN Clovis Fredrickson, MD   650 mg at 09/03/15 1655  . alum & mag hydroxide-simeth (MAALOX/MYLANTA) 200-200-20 MG/5ML suspension 30 mL  30 mL Oral Q4H PRN Jolanta B Pucilowska, MD      . amLODipine (NORVASC) tablet 10 mg  10 mg Oral Daily Loletha Grayer, MD   10 mg at 09/06/15 0809  . atenolol (TENORMIN) tablet 50 mg  50 mg Oral BID Fritzi Mandes, MD   50 mg at 09/06/15 [id number]  . benzocaine (ORAJEL) 10 % mucosal gel  Mouth/Throat TID AC & HS Hildred Priest, MD      . cloNIDine (CATAPRES) tablet 0.1 mg  0.1 mg Oral BID Fritzi Mandes, MD   0.1 mg at 09/06/15 0811  . feeding supplement (GLUCERNA SHAKE) (GLUCERNA SHAKE) liquid 237 mL  237 mL Oral TID BM Hildred Priest, MD   237 mL at 09/06/15 1400  . hydrALAZINE (APRESOLINE) tablet 50 mg  50 mg Oral 3 times per day Hildred Priest, MD   50 mg at 09/06/15 1343  . ibuprofen (ADVIL,MOTRIN) tablet 600 mg  600 mg Oral Q6H PRN Hildred Priest, MD      . insulin aspart (novoLOG) injection 0-15 Units  0-15 Units Subcutaneous TID WC Max Sane, MD   2 Units at 09/06/15 0804  . insulin aspart (novoLOG) injection 0-5 Units  0-5 Units Subcutaneous QHS Max Sane, MD   0 Units at 08/30/15 2338  . lisinopril (PRINIVIL,ZESTRIL) tablet 10 mg  10 mg Oral Daily Fritzi Mandes, MD   10 mg at 09/06/15 [id number]  . magnesium hydroxide (MILK OF  MAGNESIA) suspension 30 mL  30 mL Oral Daily PRN Jolanta B Pucilowska, MD      . metFORMIN (GLUCOPHAGE) tablet 1,000 mg  1,000 mg Oral BID WC Jolanta B Pucilowska, MD   1,000 mg at 09/06/15 0807  . mirtazapine (REMERON) tablet 15 mg  15 mg Oral QHS Hildred Priest, MD   15 mg at 09/05/15 2119  . nicotine (NICODERM CQ - dosed in mg/24 hours) patch 21 mg  21 mg Transdermal Q0600 Clovis Fredrickson, MD   21 mg at 09/02/15 0650  . ondansetron (ZOFRAN) tablet 4 mg  4 mg Oral Q12H Hildred Priest, MD   4 mg at 09/06/15 0807  . pantoprazole (PROTONIX) EC tablet 40 mg  40 mg Oral BID AC Hildred Priest, MD   40 mg at 09/06/15 [id number]  . senna (SENOKOT) tablet 8.6 mg  1 tablet Oral QHS Hildred Priest, MD   8.6 mg at 09/05/15 2119    Lab Results:  Results for orders placed or performed during the hospital encounter of 08/29/15 (from the past 48 hour(s))  Glucose, capillary     Status: None   Collection Time: 09/04/15  5:12 PM  Result Value Ref Range   Glucose-Capillary 94 65 - 99 mg/dL  Glucose, capillary     Status: None   Collection Time: 09/04/15  9:01 PM  Result Value Ref Range   Glucose-Capillary 92 65 - 99 mg/dL  Glucose, capillary     Status: Abnormal   Collection Time: 09/05/15  6:40 AM  Result Value Ref Range   Glucose-Capillary 125 (H) 65 - 99 mg/dL   Comment 1 Notify RN   Glucose, capillary     Status: Abnormal   Collection Time: 09/05/15 12:11 PM  Result Value Ref Range   Glucose-Capillary 112 (H) 65 - 99 mg/dL   Comment 1 Notify RN   Glucose, capillary     Status: None   Collection Time: 09/05/15  4:57 PM  Result Value Ref Range   Glucose-Capillary 82 65 - 99 mg/dL  Glucose, capillary     Status: None   Collection Time: 09/05/15  8:35 PM  Result Value Ref Range   Glucose-Capillary 82 65 - 99 mg/dL   Comment 1 Notify RN   Glucose, capillary     Status: Abnormal   Collection Time: 09/06/15  6:39 AM  Result Value Ref Range    Glucose-Capillary 130 (H) 65 - 99 mg/dL  Glucose, capillary     Status: Abnormal   Collection Time: 09/06/15 11:58 AM  Result Value Ref Range   Glucose-Capillary 112 (H) 65 - 99 mg/dL   Comment 1 Notify RN     Physical Findings: AIMS: Facial and Oral Movements Muscles of Facial Expression: None, normal Lips and Perioral Area: None, normal Jaw: None, normal Tongue: None, normal,Extremity Movements Upper (arms, wrists, hands, fingers): None, normal Lower (legs, knees, ankles, toes): None, normal, Trunk Movements Neck, shoulders, hips: None, normal, Overall Severity Severity of abnormal movements (highest score from questions above): None, normal Incapacitation due to abnormal movements: None, normal Patient's awareness of abnormal movements (rate only patient's report): No Awareness, Dental Status Current problems with teeth and/or dentures?: No Does patient usually wear dentures?: No  CIWA:  CIWA-Ar Total: 1 COWS:  COWS Total Score: 1  Musculoskeletal: Strength & Muscle Tone: within normal limits Gait & Station: normal Patient leans: N/A  Psychiatric Specialty Exam: Review of Systems  Constitutional: Positive for malaise/fatigue. Negative for fever, chills, weight loss and diaphoresis.  HENT: Negative.   Eyes: Negative.   Respiratory: Negative.   Cardiovascular: Negative.   Gastrointestinal: Negative for nausea and constipation.  Genitourinary: Negative.   Musculoskeletal: Negative.   Skin: Negative.   Neurological: Positive for weakness. Negative for dizziness, tingling, tremors, sensory change, speech change, focal weakness, seizures and loss of consciousness.  Endo/Heme/Allergies: Negative.   Psychiatric/Behavioral: Positive for depression and substance abuse. Negative for suicidal ideas, hallucinations and memory loss. The patient is not nervous/anxious and does not have insomnia.     Blood pressure 139/98, pulse 64, temperature 98.5 F (36.9 C), temperature source  Oral, resp. rate 20, height 5\' 11"  (1.803 m), weight 94.348 kg (208 lb), SpO2 97 %.Body mass index is 29.02 kg/(m^2).  General Appearance: Disheveled  Eye Contact::  Minimal  Speech:  Slurred  Volume:  Decreased  Mood:  Euthymic  Affect:  Appropriate  Thought Process:  Logical  Orientation:  Full (Time, Place, and Person)  Thought Content:  Hallucinations: None  Suicidal Thoughts:  No  Homicidal Thoughts:  No  Memory:  Immediate;   Fair Recent;   Fair Remote;   Fair  Judgement:  Fair  Insight:  Fair  Psychomotor Activity:  Decreased  Concentration:  Fair  Recall:  NA  Fund of Knowledge:Fair  Language: Fair  Akathisia:  No  Handed:    AIMS (if indicated):     Assets:  Financial Resources/Insurance  ADL's:  Intact  Cognition: WNL  Sleep:  Number of Hours: 7   Treatment Plan Summary: Daily contact with patient to assess and evaluate symptoms and progress in treatment and Medication management   54 year old male with a history of alcohol and cocaine dependence and a questionable history of schizoaffective disorder. The patient presented to the emergency department in [place] after he overdosed on 3 weeks worth of medication. This patient has had a multitude of visits and hospitalizations in [place] due to poorly controlled hypertension, substance abuse and withdrawals. Clearly this patient is a struggling with maintaining sobriety and will benefit from long-term inpatient substance abuse treatment.  Schizoaffective disorder versus alcohol-induced psychotic disorder: Prior to admission appears patient was prescribed with Abilify 5 low-dose mainly for hyperprolactinemia and Invega sustained at 117 mg per month. Patient also was receiving act team services to [facility]. At this time the diagnosis of schizoaffective disorder is questionable and the need for antipsychotics is unclear. Will not restart invega or abilify.  No evidence of paranoia, delusional  thinking, or hallucinations.   Spoke with  with ACT team on 10/13. They report they have not is patient having some delusions mainly related to relationships with family members. No details were provided as to the content of the delusional thinking. They were not aware that the patient had passed hemorrhagic strokes and likely cognitive issues.  Patient has not been sober for significant periods of time he has been using alcohol and crack on an ongoing basis which is likely to explain his past psychotic symptoms.  I do not believe patient suffers from a primary psychotic disorder.  Major depression: Continue mirtazapine 15 mg daily at bedtime. Seems to be improving since BO meds decreased.  Alcohol dependence: Patient was prescribed with naltrexone prior to admission. This medication has been discontinued as patient has been complaining of having to take too many medications and having nausea and weakness.  Alcohol withdrawal: resolved  Cerebrovascular disease: History of passive strokes. He has had ischemic and hemorrhagic strokes.  Cognitive disorder: Likely secondary to cerebro vascular disease  Hypertension: Patient is much improved as far as his blood pressure goes. Hospitalist saw the patient again today  Patient's blood pressure is being difficult to control so he does require a multitude of medications. However the patient is complaining now having multiple side effects such as weakness, nausea and vomiting. Hospitalist was consulted yesterday and they decrease blood pressure medicine. Unfortunately this morning again diastolic blood pressure is high.  For now I will restart hydralazine at 50 mg 3 times a day and discuss case with hospitalist.  Diabetes: Diabetes is well-controlled.  Patient will be continued on metformin 1000 mg by mouth twice a day a.m. Amaryl 4 mg by mouth daily. Patient will receive a card-controlled diet. Capillary blood sugar will be check 4 times a day. He will receive supplemental insulin. No  changes  Hypercholesterolemia:  continued on simvastatin 20 mg by mouth daily  Daily nausea: Continue Zofran by mouth twice a day.    GERD: Protonix 40 mg by mouth twice a day  Tobacco use disorder: Continue nicotine patch 21 mg a day  Precautions every 15 minute checks along with withdrawal precautions  Diet: Carb modified a low sodium.  Vital signs will be check every 8 hours  Discharge disposition: PSASAR pending (interviewed completed 10/17). Plan to discharge to a assisted living facility (pt has had multiple hospitalizations for poorly controlled hypertension, polysubstance abuse, and psychosis (which in my opinion secondary to substance abuse). Patient is unable to care for himself without supervision due to cognitive issues. Per records there is history of cerebrovascular accidents in the past.   PPD given on 10/13.    Likely he will be discharged tomorrow.  He will be discharged to a supervised living facility. He will be follow-up by ACT    Hildred Priest 09/06/2015, 3:38 PM

## 2015-09-06 NOTE — Progress Notes (Signed)
Patient with depressed affect, low energy, low interest in plan of care. Fair adls, good appetite. Fair eye contact, appropriate when nurse initiates interaction. Not actively participating in plan of care. Withdrawn and resting in bed. No SI/HI at this time. Encouraged to attend therapy groups to learn and initiate coping skills for management of stressors and diagnosis. Safety maintained.

## 2015-09-06 NOTE — BHH Group Notes (Signed)
[facility] LCSW Aftercare Discharge Planning Group Note   09/06/2015 1:25 PM  Patient did not attend.  Christa See, Clinical Social Work Intern 09/06/15  Carmell Austria, MSW, Latanya Presser 09/07/15

## 2015-09-06 NOTE — Progress Notes (Signed)
D: Patient denies SI/HI/AVH.   Patient affect is anxious and depressed and his mood is depressed.  Patient did NOT attend evening group. Patient isolative. No distress noted. A: Support and encouragement offered. Scheduled medications given to pt. Q 15 min checks continued for patient safety. R: Patient receptive. Patient remains safe on the unit.

## 2015-09-06 NOTE — Progress Notes (Signed)
Recreation Therapy Notes  Date: 10.19.16 Time: 3:00 pm Location: Craft Room  Group Topic: Self-esteem  Goal Area(s) Addresses:  Patient will identify positive attributes about self. Patient will identify at least one healthy coping skill.  Behavioral Response: Did not attend  Intervention: All About Me  Activity: Patients were instructed to make an All About Me pamphlet listing their life's motto, positive traits, healthy coping skills, and their healthy support system.  Education: LRT educated patient on ways they can increase their self-esteem   Education Outcome: Patient did not attend group.  Clinical Observations/Feedback: Patient did not attend group.  [name], LRT/CTRS 09/06/2015 4:28 PM

## 2015-09-06 NOTE — Clinical Social Work Note (Addendum)
CSW called [facility] [phone] and she stated that patient will not be accepted due to his recent history of substance use.  CSW called Mariann Laster [phone] from [facility] and she stated she did not get to meet patient yesterday evening 09/05/15 but did look at [facility] and will come to meet patient today and could possibly take him if transportation is available. She will be calling staff at her home to check on transport.   CSW called Renee Rival [phone] [facility] who met with patient on Monday 09/04/15 evening to ask about patient's assigned PASARR number as this writer searched [facility] and did not see it assigned yet and patient may have placement today but will need this PASARR number for placement.

## 2015-09-06 NOTE — Clinical Social Work Note (Signed)
Patient met with Mariann Laster from [facility] and offered bed but bed will not be available till tomorrow. Patient follows in [place] [phone] and CSW faxed letter to ACT team as requested to cover patient missing court tomorrow.

## 2015-09-06 NOTE — Plan of Care (Signed)
Problem: Consults Goal: Suicide Risk Patient Education (See Patient Education module for education specifics)  Outcome: Progressing No SI/HI at this time.

## 2015-09-07 LAB — GLUCOSE, CAPILLARY
GLUCOSE-CAPILLARY: 121 mg/dL — AB (ref 65–99)
GLUCOSE-CAPILLARY: 198 mg/dL — AB (ref 65–99)

## 2015-09-07 MED ORDER — HYDRALAZINE HCL 50 MG PO TABS: 50.0000 mg | ORAL_TABLET | Freq: Three times a day (TID) | ORAL | Status: DC

## 2015-09-07 MED ORDER — GLIMEPIRIDE 1 MG PO TABS: 1.0000 mg | ORAL_TABLET | Freq: Every day | ORAL | Status: DC

## 2015-09-07 MED ORDER — AMLODIPINE BESYLATE 10 MG PO TABS: 10.0000 mg | ORAL_TABLET | Freq: Every day | ORAL | Status: DC

## 2015-09-07 MED ORDER — CLONIDINE HCL 0.1 MG PO TABS: 0.1000 mg | ORAL_TABLET | Freq: Two times a day (BID) | ORAL | Status: DC

## 2015-09-07 MED ORDER — CLONIDINE HCL 0.1 MG PO TABS: 0.1000 mg | ORAL_TABLET | Freq: Three times a day (TID) | ORAL | Status: DC

## 2015-09-07 MED ORDER — GLIMEPIRIDE 2 MG PO TABS
1.0000 mg | ORAL_TABLET | Freq: Every day | ORAL | Status: DC
  Administered 2015-09-07: 1 mg via ORAL

## 2015-09-07 MED ORDER — MIRTAZAPINE 15 MG PO TABS: 15.0000 mg | ORAL_TABLET | Freq: Every day | ORAL | Status: DC

## 2015-09-07 MED ORDER — PANTOPRAZOLE SODIUM 40 MG PO TBEC: 40.0000 mg | DELAYED_RELEASE_TABLET | Freq: Every day | ORAL | Status: DC

## 2015-09-07 MED ORDER — LISINOPRIL 20 MG PO TABS: 20.0000 mg | ORAL_TABLET | Freq: Every day | ORAL | Status: DC

## 2015-09-07 MED ORDER — CLONIDINE HCL 0.1 MG PO TABS
0.1000 mg | ORAL_TABLET | Freq: Three times a day (TID) | ORAL | Status: DC
  Administered 2015-09-07: 0.1 mg via ORAL
  Filled 2015-09-07: qty 1

## 2015-09-07 MED ORDER — METFORMIN HCL 1000 MG PO TABS: 1000.0000 mg | ORAL_TABLET | Freq: Two times a day (BID) | ORAL | Status: DC

## 2015-09-07 MED ORDER — SIMVASTATIN 20 MG PO TABS: 20.0000 mg | ORAL_TABLET | Freq: Every day | ORAL | Status: DC

## 2015-09-07 MED ORDER — LISINOPRIL 20 MG PO TABS
20.0000 mg | ORAL_TABLET | Freq: Every day | ORAL | Status: DC
  Administered 2015-09-07: 20 mg via ORAL
  Filled 2015-09-07: qty 1

## 2015-09-07 MED ORDER — ATENOLOL 50 MG PO TABS: 50.0000 mg | ORAL_TABLET | Freq: Two times a day (BID) | ORAL | Status: DC

## 2015-09-07 NOTE — BHH Suicide Risk Assessment (Addendum)
[facility] Discharge Suicide Risk Assessment   Demographic Factors:  Male and Low socioeconomic status  Total Time spent with patient: 30 minutes   Psychiatric Specialty Exam: Physical Exam  ROS                                                         Have you used any form of tobacco in the last 30 days? (Cigarettes, Smokeless Tobacco, Cigars, and/or Pipes): Yes  Has this patient used any form of tobacco in the last 30 days? (Cigarettes, Smokeless Tobacco, Cigars, and/or Pipes) Yes, A prescription for an FDA-approved tobacco cessation medication was offered at discharge and the patient refused  Mental Status Per Nursing Assessment::   On Admission:     Current Mental Status by Physician: Much improved, mood euthymic, affect reactive. Denies hopelessness, worthlessness or helplessness. Denies SI, HI. No evidence of psychosis, mania or hypomania.  Loss Factors: Loss of significant relationship  Historical Factors: Impulsivity  Risk Reduction Factors:   Positive social support  Continued Clinical Symptoms:  Depression:   Comorbid alcohol abuse/dependence Impulsivity Alcohol/Substance Abuse/Dependencies Previous Psychiatric Diagnoses and Treatments Medical Diagnoses and Treatments/Surgeries  Cognitive Features That Contribute To Risk:  Closed-mindedness    Suicide Risk:  Minimal: No identifiable suicidal ideation.  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 Cerebrovascular disease [I67.9] 09/02/2015  . 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 Malignant hypertension [I10] 08/30/2015  . Alcohol use  disorder, severe, dependence ([place]) [F10.20] 08/30/2015  . Alcohol withdrawal ([place]) [F10.239] 08/30/2015  . 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 Thrombocytopenia ([place]) [D69.6] 08/27/2015  . Tobacco use disorder [F17.200] 08/26/2015  . Hyperlipidemia [E78.5] 04/28/2015  . Cocaine use disorder, severe, dependence ([place]) [F14.20] 04/27/2015  . Gout [M10.9] 08/09/2013  . Diabetes mellitus ([place]) [E11.9] 07/09/2012    Follow-up Information    Follow up with [name].   Why:  For follow-up care ACT will see patient at discharge    Contact information:   [place], [place] Ph [phone] Fax [phone]        Is patient on multiple antipsychotic therapies at discharge:  No   Has Patient had three or more failed trials of antipsychotic monotherapy by history:  No  Recommended Plan for Multiple Antipsychotic Therapies: NA    Hildred Priest 09/07/2015, 9:40 AM

## 2015-09-07 NOTE — BHH Suicide Risk Assessment (Signed)
[place] INPATIENT:  Family/Significant Other Suicide Prevention Education  Suicide Prevention Education:  Patient Refusal for Family/Significant Other Suicide Prevention Education: The patient [name] has refused to provide written consent for family/significant other to be provided Family/Significant Other Suicide Prevention Education during admission and/or prior to discharge.  Physician notified.  Keene Breath, MSW, LCSWA 09/07/2015, 8:50 AM

## 2015-09-07 NOTE — Progress Notes (Signed)
Patient ID: [name], male   DOB: 12-25-1960, 54 y.o.   MRN: [id number] [facility] PROGRESS NOTE  PCP: [name] OF CARE  HPI/Subjective: Patient to be discharged today. Feeling better. Offers no complaints. Reviewed medications with patient.  Objective: Filed Vitals:   09/07/15 1418  BP: 128/92  Pulse: 68  Temp:   Resp:     Filed Weights   08/29/15 1500  Weight: 94.348 kg (208 lb)    ROS: Review of Systems  Constitutional: Negative for malaise/fatigue.  Respiratory: Negative for cough and sputum production.   Cardiovascular: Negative for chest pain.  Gastrointestinal: Negative for abdominal pain.   Exam: Physical Exam  Eyes: Conjunctivae are normal.  Cardiovascular: S1 normal and S2 normal.  Exam reveals no gallop.   No murmur heard. Pulses:      Dorsalis pedis pulses are 2+ on the right side, and 2+ on the left side.  Respiratory: No respiratory distress. He has no wheezes. He has no rhonchi. He has no rales.  Musculoskeletal:       Right ankle: He exhibits swelling.       Left ankle: He exhibits swelling.  Lymphadenopathy:    He has no cervical adenopathy.  Neurological: He is alert.  Skin: Skin is warm. No rash noted.  Psychiatric: He has a normal mood and affect.    Data Reviewed:  CBG:  Recent Labs Lab 09/06/15 1158 09/06/15 1709 09/06/15 2028 09/07/15 0652 09/07/15 1156  GLUCAP 112* 70 94 121* 198*    Scheduled Meds: . amLODipine  10 mg Oral Daily  . atenolol  50 mg Oral BID  . cloNIDine  0.1 mg Oral TID  . glimepiride  1 mg Oral Q breakfast  . hydrALAZINE  50 mg Oral 3 times per day  . lisinopril  20 mg Oral Daily  . metFORMIN  1,000 mg Oral BID WC  . mirtazapine  15 mg Oral QHS  . nicotine  21 mg Transdermal Q0600  . ondansetron  4 mg Oral Q12H  . pantoprazole  40 mg Oral BID AC    Assessment/Plan:  1. Accelerated hypertension. Increase clonidine to 0.1 mg 3 times a day and blood pressure improved  on the latest reading. 2. Type 2 diabetes mellitus- hemoglobin A1c 7.6. since fingersticks that increased over 200 again I restarted the low dosed Amaryl 1 mg daily and can continue Glucophage.. 3. Weakness. Patient feeling stronger today. Continue to hold statin at this point. Patient must stop drinking. 4. Alcohol abuse- patient must stop drinking. 5. Tobacco abuse- smoking cessation discussed by me.   Disposition Plan: Discharge to assisted living.  Time spent: 15 minutes  Loletha Grayer  [facility] Hospitalists
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
## 2015-09-07 NOTE — BHH Group Notes (Signed)
[place] LCSW Group Therapy  09/07/2015 1:54 PM  Type of Therapy:  Group Therapy  Participation Level:  Did Not Attend  Modes of Intervention:  Discussion, Education, Socialization and Support  Summary of Progress/Problems: Balance in life: Patients will discuss the concept of balance and how it looks and feels to be unbalanced. Pt will identify areas in their life that is unbalanced and ways to become more balanced.    [place] MSW, [place]  09/07/2015, 1:54 PM

## 2015-09-07 NOTE — Progress Notes (Signed)
Recreation Therapy Notes  Date: 10.20.16 Time: 3:00 pm Location: Craft Room  Group Topic: Leisure Education  Goal Area(s) Addresses:  Patient will write at least one leisure activity. Patient will write a leisure goal.  Behavioral Response: Did not attend   Intervention: Leisure Bucket List  Activity: Patients were given a leisure bucket list worksheet and instructed to list different leisure activity they had not tried, but wanted to.  Education:LRT educated patients on what is needed to participate in leisure.  Education Outcome: Patient did not attend group.  Clinical Observations/Feedback: Patient did not attend group.  [name], LRT/CTRS 09/07/2015 4:17 PM

## 2015-09-07 NOTE — Plan of Care (Signed)
Problem: Diagnosis: Increased Risk For Suicide Attempt Goal: STG-Patient Will Comply With Medication Regime Outcome: Progressing Patient is compliant with medication.

## 2015-09-07 NOTE — Progress Notes (Signed)
Pleasant and cooperative.  Denies depression, SI/HI or AVH. Discharge instructions given, verbalized understanding.  Prescriptions given, and personal belongings returned.  Escorted off unit by this Probation officer to ride to travel home.

## 2015-09-07 NOTE — BHH Group Notes (Signed)
[place] Group Notes:  (Nursing/MHT/Case Management/Adjunct)  Date:  09/07/2015  Time:  3:54 PM  Type of Therapy:  Psychoeducational Skills  Participation Level:  Did Not Attend  Adela Lank [facility] 09/07/2015, 3:54 PM

## 2015-09-07 NOTE — Progress Notes (Signed)
  [facility] Adult Case Management Discharge Plan :  Will you be returning to the same living situation after discharge:  No. Patient will be taking a bed at [facility] in [place] at [place] [phone] At discharge, do you have transportation home?: Yes,  family care home to pick up Do you have the ability to pay for your medications: Yes,  patient has insurance  Release of information consent forms completed and in the chart;  Patient's signature needed at discharge.  Patient to Follow up at: Follow-up Information    Follow up with [name] On 09/08/2015.   Why:  For follow-up care ACT will see patient morning after discharge on Friday 09/08/15    Contact information:   [address] [place], [place] Ph [phone] Fax [phone]       Patient denies SI/HI: Yes,  patient denies SI/HI    Safety Planning and Suicide Prevention discussed: Yes,  SPE discussed with patient but patient refused family contact  Have you used any form of tobacco in the last 30 days? (Cigarettes, Smokeless Tobacco, Cigars, and/or Pipes): Yes  Has patient been referred to the Quitline?: Patient refused referral  Keene Breath, MSW, LCSWA 09/07/2015, 3:04 PM

## 2015-09-08 NOTE — Progress Notes (Signed)
Recreation Therapy Notes  INPATIENT RECREATION TR PLAN  Patient Details Name: [name] MRN: [id number] DOB: [birth year] Today's Date: 09/08/2015  Rec Therapy Plan Is patient appropriate for Therapeutic Recreation?: Yes Treatment times per week: At least once a week TR Treatment/Interventions: 1:1 session, Group participation (Comment) (Appropriate participation in daily recreation therapy tx)  Discharge Criteria Pt will be discharged from therapy if:: Treatment goals are met, Discharged  Discharge Summary Short term goals set: See Care Plan Short term goals met: Complete Progress toward goals comments: One-to-one attended One-to-one attended: Self-esteem, stress management, coping skills Reason goals not met: N/A Therapeutic equipment acquired: None Reason patient discharged from therapy: Discharge from hospital Pt/family agrees with progress & goals achieved: Yes Date patient discharged from therapy: 09/07/15   [name], LRT/CTRS 09/08/2015, 4:31 PM

## 2015-09-08 NOTE — Progress Notes (Signed)
  [facility] Adult Case Management Discharge Plan :  Will you be returning to the same living situation after discharge:  No. Patient is placed in a family care [place] At discharge, do you have transportation home?: Yes,  group home to pick up Do you have the ability to pay for your medications: Yes,  patient has medicaid  Release of information consent forms completed and in the chart;  Patient's signature needed at discharge.  Patient to Follow up at: Follow-up Information    Follow up with [name] On 09/08/2015.   Why:  For follow-up care ACT will see patient morning after discharge on Friday 09/08/15    Contact information:   [address] [place], [place] Ph [phone] Fax [phone]       Patient denies SI/HI: Yes,  patient denies SI/HI    Safety Planning and Suicide Prevention discussed: Yes,  SPE discussed with patient but patient refused family contact  Have you used any form of tobacco in the last 30 days? (Cigarettes, Smokeless Tobacco, Cigars, and/or Pipes): Yes  Has patient been referred to the Quitline?: Patient refused referral  Keene Breath, MSW, LCSWA 09/08/2015, 4:19 PM

## 2015-11-23 ENCOUNTER — Telehealth: Payer: Self-pay | Admitting: Gastroenterology

## 2015-11-23 NOTE — Telephone Encounter (Signed)
Left message with patients mother to have him call us. She said he was in a "home" to help him get well. She will try to find the number there and call to give [place] it. Patient needs to see Dr. Allen Norris for Hep C. Notes under Media

## 2016-04-18 ENCOUNTER — Encounter (HOSPITAL_COMMUNITY): Payer: Self-pay | Admitting: Emergency Medicine

## 2016-04-18 ENCOUNTER — Emergency Department (HOSPITAL_COMMUNITY)
Admission: EM | Admit: 2016-04-18 | Discharge: 2016-04-18 | Disposition: A | Discharge status: Home / Self Care | Payer: Medicaid Other | Attending: Emergency Medicine | Admitting: Emergency Medicine

## 2016-04-18 DIAGNOSIS — E119 Type 2 diabetes mellitus without complications: Secondary | ICD-10-CM | POA: Insufficient documentation

## 2016-04-18 DIAGNOSIS — M545 Low back pain: Secondary | ICD-10-CM | POA: Diagnosis present

## 2016-04-18 DIAGNOSIS — Z7984 Long term (current) use of oral hypoglycemic drugs: Secondary | ICD-10-CM | POA: Insufficient documentation

## 2016-04-18 DIAGNOSIS — M5432 Sciatica, left side: Secondary | ICD-10-CM

## 2016-04-18 DIAGNOSIS — Z7982 Long term (current) use of aspirin: Secondary | ICD-10-CM | POA: Insufficient documentation

## 2016-04-18 DIAGNOSIS — Z79899 Other long term (current) drug therapy: Secondary | ICD-10-CM | POA: Diagnosis not present

## 2016-04-18 DIAGNOSIS — F329 Major depressive disorder, single episode, unspecified: Secondary | ICD-10-CM | POA: Insufficient documentation

## 2016-04-18 DIAGNOSIS — F17210 Nicotine dependence, cigarettes, uncomplicated: Secondary | ICD-10-CM | POA: Diagnosis not present

## 2016-04-18 DIAGNOSIS — I10 Essential (primary) hypertension: Secondary | ICD-10-CM | POA: Diagnosis not present

## 2016-04-18 DIAGNOSIS — M5442 Lumbago with sciatica, left side: Secondary | ICD-10-CM | POA: Insufficient documentation

## 2016-04-18 DIAGNOSIS — Z8673 Personal history of transient ischemic attack (TIA), and cerebral infarction without residual deficits: Secondary | ICD-10-CM | POA: Diagnosis not present

## 2016-04-18 LAB — I-STAT CHEM 8, ED
BUN: 4 mg/dL — ABNORMAL LOW (ref 6–20)
CALCIUM ION: 1.13 mmol/L (ref 1.12–1.23)
CHLORIDE: 101 mmol/L (ref 101–111)
Creatinine, Ser: 0.8 mg/dL (ref 0.61–1.24)
GLUCOSE: 92 mg/dL (ref 65–99)
HCT: 48 % (ref 39.0–52.0)
HEMOGLOBIN: 16.3 g/dL (ref 13.0–17.0)
Potassium: 3.3 mmol/L — ABNORMAL LOW (ref 3.5–5.1)
SODIUM: 143 mmol/L (ref 135–145)
TCO2: 27 mmol/L (ref 0–100)

## 2016-04-18 LAB — URINE MICROSCOPIC-ADD ON
Bacteria, UA: NONE SEEN
RBC / HPF: NONE SEEN RBC/hpf (ref 0–5)
WBC UA: NONE SEEN WBC/hpf (ref 0–5)

## 2016-04-18 LAB — URINALYSIS, ROUTINE W REFLEX MICROSCOPIC
Bilirubin Urine: NEGATIVE
GLUCOSE, UA: NEGATIVE mg/dL
Ketones, ur: NEGATIVE mg/dL
Leukocytes, UA: NEGATIVE
Nitrite: NEGATIVE
PROTEIN: 100 mg/dL — AB
Specific Gravity, Urine: 1.009 (ref 1.005–1.030)
pH: 6.5 (ref 5.0–8.0)

## 2016-04-18 MED ORDER — CLONIDINE HCL 0.1 MG PO TABS: 0.1000 mg | ORAL_TABLET | Freq: Three times a day (TID) | ORAL | Status: DC

## 2016-04-18 MED ORDER — LISINOPRIL 20 MG PO TABS: 20.0000 mg | ORAL_TABLET | Freq: Every day | ORAL | Status: DC

## 2016-04-18 MED ORDER — CLONIDINE HCL 0.1 MG PO TABS
0.1000 mg | ORAL_TABLET | Freq: Once | ORAL | Status: AC
  Administered 2016-04-18: 0.1 mg via ORAL
  Filled 2016-04-18: qty 1

## 2016-04-18 MED ORDER — POTASSIUM CHLORIDE CRYS ER 20 MEQ PO TBCR
40.0000 meq | EXTENDED_RELEASE_TABLET | Freq: Once | ORAL | Status: AC
  Administered 2016-04-18: 40 meq via ORAL
  Filled 2016-04-18: qty 2

## 2016-04-18 MED ORDER — PREDNISONE 50 MG PO TABS: 50.0000 mg | ORAL_TABLET | Freq: Every day | ORAL | Status: DC

## 2016-04-18 MED ORDER — OXYCODONE-ACETAMINOPHEN 5-325 MG PO TABS
1.0000 | ORAL_TABLET | Freq: Once | ORAL | Status: AC
  Administered 2016-04-18: 1 via ORAL
  Filled 2016-04-18: qty 1

## 2016-04-18 MED ORDER — CYCLOBENZAPRINE HCL 5 MG PO TABS: 5.0000 mg | ORAL_TABLET | Freq: Three times a day (TID) | ORAL | Status: DC | PRN

## 2016-04-18 MED ORDER — ATENOLOL 50 MG PO TABS: 50.0000 mg | ORAL_TABLET | Freq: Two times a day (BID) | ORAL | Status: DC

## 2016-04-18 MED ORDER — AMLODIPINE BESYLATE 10 MG PO TABS: 10.0000 mg | ORAL_TABLET | Freq: Every day | ORAL | Status: DC

## 2016-04-18 NOTE — ED Provider Notes (Signed)
CSN: [id number]     Arrival date & time 04/18/16  1202 History   First MD Initiated Contact with Patient 04/18/16 1523     Chief Complaint  Patient presents with  . Back Pain     (Consider location/radiation/quality/duration/timing/severity/associated sxs/prior Treatment) HPI  Pt presenting with c/o low back pain and left hip pain.  He states pain is chronic for him but was made worse by picking up a heavy pot a few days ago.  No weakness of legs. No retention of urine, no incontinence of bowel or bladder.  No fever/chills.  Pt states he has been incarcerated for approx 1 year and needs pain medication.  He states he used to take vicodin.  No fall or trauma.  Pain is worse with certain positions and walking.  No dysuria.  Pt also states he has not taken his BP meds in the past 2 days.  He has no chest pain or shortness of breath.  No focal weakness, no changes in vision or speech. He states he has tried advil, aspirin for the back pain.  There are no other associated systemic symptoms, there are no other alleviating or modifying factors.   Past Medical History  Diagnosis Date  . Drug abuse   . Alcohol abuse   . Diabetes mellitus   . Uncontrolled hypertension   . Arthritis   . Gout   . DTs (delirium tremens) ([place])     history of   . Crack cocaine use   . Noncompliance with medication regimen   . Depression   . Schizophrenia ([place])   . Stroke [facility])    Past Surgical History  Procedure Laterality Date  . Mandible reconstruction     Family History  Problem Relation Age of Onset  . Hypertension     Social History  Substance Use Topics  . Smoking status: Current Every Day Smoker -- 0.25 packs/day    Types: Cigarettes  . Smokeless tobacco: Never Used  . Alcohol Use: 0.6 - 1.2 oz/week    1-2 Cans of beer per week     Comment: [facility]    Review of Systems  ROS reviewed and all otherwise negative except for mentioned in HPI    Allergies  Review of patient's allergies  indicates no known allergies.  Home Medications   Prior to Admission medications   Medication Sig Start Date End Date Taking? Authorizing Provider  acetaminophen (TYLENOL) 325 MG tablet Take 650 mg by mouth every 6 (six) hours as needed for mild pain or headache.   Yes Historical Provider, MD  aspirin 325 MG tablet Take 325 mg by mouth every 6 (six) hours as needed for mild pain.   Yes Historical Provider, MD  glimepiride (AMARYL) 1 MG tablet Take 1 tablet (1 mg total) by mouth daily with breakfast. 09/07/15  Yes Hildred Priest, MD  hydrALAZINE (APRESOLINE) 50 MG tablet Take 1 tablet (50 mg total) by mouth every 8 (eight) hours. 09/07/15  Yes Hildred Priest, MD  ibuprofen (ADVIL,MOTRIN) 200 MG tablet Take 200 mg by mouth every 6 (six) hours as needed for moderate pain.   Yes Historical Provider, MD  metFORMIN (GLUCOPHAGE) 1000 MG tablet Take 1 tablet (1,000 mg total) by mouth 2 (two) times daily with a meal. 09/07/15  Yes Hildred Priest, MD  mirtazapine (REMERON) 15 MG tablet Take 1 tablet (15 mg total) by mouth at bedtime. 09/07/15  Yes Hildred Priest, MD  pantoprazole (PROTONIX) 40 MG tablet Take 1 tablet (40 mg  total) by mouth daily. 09/07/15  Yes Hildred Priest, MD  amLODipine (NORVASC) 10 MG tablet Take 1 tablet (10 mg total) by mouth daily. 04/18/16   [name], MD  atenolol (TENORMIN) 50 MG tablet Take 1 tablet (50 mg total) by mouth 2 (two) times daily. 04/18/16   [name], MD  cloNIDine (CATAPRES) 0.1 MG tablet Take 1 tablet (0.1 mg total) by mouth 3 (three) times daily. 04/18/16   [name], MD  cyclobenzaprine (FLEXERIL) 5 MG tablet Take 1 tablet (5 mg total) by mouth 3 (three) times daily as needed for muscle spasms. 04/18/16   [name], MD  lisinopril (PRINIVIL,ZESTRIL) 20 MG tablet Take 1 tablet (20 mg total) by mouth daily. 04/18/16   [name], MD  predniSONE (DELTASONE) 50 MG tablet Take 1 tablet (50 mg total) by  mouth daily. 04/18/16   [name], MD   BP 190/110 mmHg  Pulse 82  Temp(Src) 98.4 F (36.9 C) (Oral)  Resp 17  SpO2 99%  Vitals reviewed Physical Exam  Physical Examination: General appearance - alert, well appearing, and in no distress Mental status - alert, oriented to person, place, and time Eyes - no conjunctival injection, no scleral icterus Chest - clear to auscultation, no wheezes, rales or rhonchi, symmetric air entry Heart - normal rate, regular rhythm, normal S1, S2, no murmurs, rubs, clicks or gallops Abdomen - soft, nontender, nondistended, no masses or organomegaly Back exam - left sided lumbar paraspinal tenderness, no midline tenderness, no CVA tenderness Neurological - alert, oriented, normal speech, strength 5/5 in extremities x 4, sensation intact Musculoskeletal - no joint tenderness, deformity or swelling Extremities - peripheral pulses normal, no pedal edema, no clubbing or cyanosis Skin - normal coloration and turgor, no rashes  ED Course  Procedures (including critical care time) Labs Review Labs Reviewed  URINALYSIS, ROUTINE W REFLEX MICROSCOPIC (NOT AT [facility]) - Abnormal; Notable for the following:    Hgb urine dipstick TRACE (*)    Protein, ur 100 (*)    All other components within normal limits  URINE MICROSCOPIC-ADD ON - Abnormal; Notable for the following:    Squamous Epithelial / LPF 0-5 (*)    All other components within normal limits  I-STAT CHEM 8, ED - Abnormal; Notable for the following:    Potassium 3.3 (*)    BUN 4 (*)    All other components within normal limits    Imaging Review No results found. I have personally reviewed and evaluated these images and lab results as part of my medical decision-making.   EKG Interpretation   Date/Time:  Thursday April 18 2016 16:17:30 EDT Ventricular Rate:  81 PR Interval:  196 QRS Duration: 102 QT Interval:  379 QTC Calculation: 440 R Axis:   102 Text Interpretation:  Sinus rhythm LAE,  consider biatrial enlargement  Consider left ventricular hypertrophy Nonspecific T abnormalities, diffuse  leads similar appearance to prior, t wave inversions laterally are more  prominent Confirmed by [facility]  MD, [place] [phone]) on 04/18/2016 4:25:06 PM      MDM   Final diagnoses:  Sciatica neuralgia, left  hypertension  Pt presenting with increase in chronic low back pain and left hip pain.  Symptoms most c/w sciatica.  No signs or symptoms of cauda equina, no fever to suggest epidural abscess.  Per chart review patient has been seen in the ED for overdoses and has recorded hx of drug abuse.  D/w patient that I will not be able to give him  rx for narcotic meds.  Will give muscle relaxer and steroids for sciatica.  Pt is also hypertensive.  Some blood and protein in urine and signs of LVH on EKG but no acute findings.  Pt given clinidin in the ED and refilled all his BP prescriptions.  He states he has been incarcerated for the past year.  Discharged with strict return precautions.  Pt agreeable with plan.      [name], MD 04/18/16 [phone]

## 2016-04-18 NOTE — ED Notes (Signed)
Patient here with 3 days of lower back pain after lifting heavy pot and ongoing left chronic hip pain. Has not taken BP meds x 2 days

## 2016-04-18 NOTE — Discharge Instructions (Signed)
Return to the ED with any concerns including chest pain, shortness of breath, leg weakness, not able to urinate, loss of control of bowel or bladder, changes in vision or speech, fever/chills, decreased level of alertness/lethargy, or any other alarming symptoms

## 2016-04-18 NOTE — ED Notes (Signed)
Informed Amy - RN of pt's BP.

## 2016-05-20 ENCOUNTER — Emergency Department (HOSPITAL_COMMUNITY)
Admission: EM | Admit: 2016-05-20 | Discharge: 2016-05-20 | Disposition: A | Discharge status: Home / Self Care | Payer: Medicaid Other | Attending: Dermatology | Admitting: Dermatology

## 2016-05-20 ENCOUNTER — Encounter (HOSPITAL_COMMUNITY): Payer: Self-pay | Admitting: Family Medicine

## 2016-05-20 DIAGNOSIS — Z8673 Personal history of transient ischemic attack (TIA), and cerebral infarction without residual deficits: Secondary | ICD-10-CM | POA: Insufficient documentation

## 2016-05-20 DIAGNOSIS — Z5321 Procedure and treatment not carried out due to patient leaving prior to being seen by health care provider: Secondary | ICD-10-CM | POA: Diagnosis not present

## 2016-05-20 DIAGNOSIS — M545 Low back pain: Secondary | ICD-10-CM | POA: Insufficient documentation

## 2016-05-20 DIAGNOSIS — F17210 Nicotine dependence, cigarettes, uncomplicated: Secondary | ICD-10-CM | POA: Insufficient documentation

## 2016-05-20 DIAGNOSIS — M79606 Pain in leg, unspecified: Secondary | ICD-10-CM | POA: Insufficient documentation

## 2016-05-20 DIAGNOSIS — I10 Essential (primary) hypertension: Secondary | ICD-10-CM | POA: Insufficient documentation

## 2016-05-20 DIAGNOSIS — E119 Type 2 diabetes mellitus without complications: Secondary | ICD-10-CM | POA: Insufficient documentation

## 2016-05-20 NOTE — ED Notes (Signed)
Pt. Stated, Im having bad back and leg pain for 2-3 years and worse the last 2 weeks.

## 2016-05-20 NOTE — ED Notes (Signed)
Patient called x 2 in sub waiting and main waiting with no answer.

## 2016-05-21 ENCOUNTER — Emergency Department (HOSPITAL_COMMUNITY)
Admission: EM | Admit: 2016-05-21 | Discharge: 2016-05-21 | Disposition: A | Discharge status: Home / Self Care | Payer: Medicaid Other | Attending: Emergency Medicine | Admitting: Emergency Medicine

## 2016-05-21 ENCOUNTER — Encounter (HOSPITAL_COMMUNITY): Payer: Self-pay

## 2016-05-21 DIAGNOSIS — E119 Type 2 diabetes mellitus without complications: Secondary | ICD-10-CM | POA: Diagnosis not present

## 2016-05-21 DIAGNOSIS — M545 Low back pain: Secondary | ICD-10-CM | POA: Diagnosis not present

## 2016-05-21 DIAGNOSIS — Z7984 Long term (current) use of oral hypoglycemic drugs: Secondary | ICD-10-CM | POA: Insufficient documentation

## 2016-05-21 DIAGNOSIS — I10 Essential (primary) hypertension: Secondary | ICD-10-CM | POA: Insufficient documentation

## 2016-05-21 DIAGNOSIS — Z7982 Long term (current) use of aspirin: Secondary | ICD-10-CM | POA: Insufficient documentation

## 2016-05-21 DIAGNOSIS — Z79899 Other long term (current) drug therapy: Secondary | ICD-10-CM | POA: Diagnosis not present

## 2016-05-21 DIAGNOSIS — F17210 Nicotine dependence, cigarettes, uncomplicated: Secondary | ICD-10-CM | POA: Diagnosis not present

## 2016-05-21 DIAGNOSIS — Z8673 Personal history of transient ischemic attack (TIA), and cerebral infarction without residual deficits: Secondary | ICD-10-CM | POA: Insufficient documentation

## 2016-05-21 DIAGNOSIS — G8929 Other chronic pain: Secondary | ICD-10-CM

## 2016-05-21 DIAGNOSIS — M549 Dorsalgia, unspecified: Secondary | ICD-10-CM

## 2016-05-21 MED ORDER — OXYCODONE-ACETAMINOPHEN 5-325 MG PO TABS
2.0000 | ORAL_TABLET | Freq: Once | ORAL | Status: AC
Start: 2016-05-21 — End: 2016-05-21
  Administered 2016-05-21: 2 via ORAL
  Filled 2016-05-21: qty 2

## 2016-05-21 MED ORDER — DIAZEPAM 5 MG PO TABS: 5.0000 mg | ORAL_TABLET | Freq: Three times a day (TID) | ORAL | Status: DC | PRN

## 2016-05-21 NOTE — ED Provider Notes (Signed)
CSN: [id number]     Arrival date & time 05/21/16  [id number] History   First MD Initiated Contact with Patient 05/21/16 0813     Chief Complaint  Patient presents with  . Back Pain     (Consider location/radiation/quality/duration/timing/severity/associated sxs/prior Treatment) HPI Comments: 55 yo M w/ PMH including HTN, gout, polysubstance abuse, CVA, schizophrenia who p/w back pain. The patient has a several year history of chronic back pain. He presented here last month with an exacerbation of his back pain after he lifted a heavy pot. He was evaluated in the ED and given Flexeril and steroids. He reports no improvement in his pain. He states that the pain is worse with movement and radiates from his lower back into his bilateral buttocks and legs. He denies any numbness or weakness. No bowel/bladder incontinence. No fevers, cough/cold symptoms, or recent illness. No night sweats or unintentional weight loss. He has taken ibuprofen without any relief. Of note, the patient checked into the ED yesterday and had left before being seen.  Patient is a 55 y.o. male presenting with back pain. The history is provided by the patient.  Back Pain   Past Medical History  Diagnosis Date  . Drug abuse   . Alcohol abuse   . Diabetes mellitus   . Uncontrolled hypertension   . Arthritis   . Gout   . DTs (delirium tremens) ([place])     history of   . Crack cocaine use   . Noncompliance with medication regimen   . Depression   . Schizophrenia ([place])   . Stroke [facility])    Past Surgical History  Procedure Laterality Date  . Mandible reconstruction     Family History  Problem Relation Age of Onset  . Hypertension     Social History  Substance Use Topics  . Smoking status: Current Every Day Smoker -- 0.25 packs/day    Types: Cigarettes  . Smokeless tobacco: Never Used  . Alcohol Use: 0.6 - 1.2 oz/week    1-2 Cans of beer per week     Comment: [facility]    Review of Systems  Musculoskeletal:  Positive for back pain.   10 Systems reviewed and are negative for acute change except as noted in the HPI.   Allergies  Review of patient's allergies indicates no known allergies.  Home Medications   Prior to Admission medications   Medication Sig Start Date End Date Taking? Authorizing Provider  acetaminophen (TYLENOL) 325 MG tablet Take 650 mg by mouth every 6 (six) hours as needed for mild pain or headache.    Historical Provider, MD  amLODipine (NORVASC) 10 MG tablet Take 1 tablet (10 mg total) by mouth daily. 04/18/16   [name], MD  aspirin 325 MG tablet Take 325 mg by mouth every 6 (six) hours as needed for mild pain.    Historical Provider, MD  atenolol (TENORMIN) 50 MG tablet Take 1 tablet (50 mg total) by mouth 2 (two) times daily. 04/18/16   [name], MD  cloNIDine (CATAPRES) 0.1 MG tablet Take 1 tablet (0.1 mg total) by mouth 3 (three) times daily. 04/18/16   [name], MD  cyclobenzaprine (FLEXERIL) 5 MG tablet Take 1 tablet (5 mg total) by mouth 3 (three) times daily as needed for muscle spasms. 04/18/16   [name], MD  diazepam (VALIUM) 5 MG tablet Take 1 tablet (5 mg total) by mouth every 8 (eight) hours as needed for muscle spasms. 05/21/16   [place],  MD  glimepiride (AMARYL) 1 MG tablet Take 1 tablet (1 mg total) by mouth daily with breakfast. 09/07/15   Hildred Priest, MD  hydrALAZINE (APRESOLINE) 50 MG tablet Take 1 tablet (50 mg total) by mouth every 8 (eight) hours. 09/07/15   Hildred Priest, MD  ibuprofen (ADVIL,MOTRIN) 200 MG tablet Take 200 mg by mouth every 6 (six) hours as needed for moderate pain.    Historical Provider, MD  lisinopril (PRINIVIL,ZESTRIL) 20 MG tablet Take 1 tablet (20 mg total) by mouth daily. 04/18/16   [name], MD  metFORMIN (GLUCOPHAGE) 1000 MG tablet Take 1 tablet (1,000 mg total) by mouth 2 (two) times daily with a meal. 09/07/15   Hildred Priest, MD  mirtazapine (REMERON) 15 MG  tablet Take 1 tablet (15 mg total) by mouth at bedtime. 09/07/15   Hildred Priest, MD  pantoprazole (PROTONIX) 40 MG tablet Take 1 tablet (40 mg total) by mouth daily. 09/07/15   Hildred Priest, MD  predniSONE (DELTASONE) 50 MG tablet Take 1 tablet (50 mg total) by mouth daily. 04/18/16   [name], MD   BP 183/120 mmHg  Pulse 84  Temp(Src) 97.5 F (36.4 C) (Oral)  Resp 16  SpO2 100% Physical Exam  Constitutional: He is oriented to person, place, and time. He appears well-developed and well-nourished. No distress.  HENT:  Head: Normocephalic and atraumatic.  Moist mucous membranes  Eyes: Conjunctivae are normal.  Neck: Neck supple.  Cardiovascular: Normal rate, regular rhythm and normal heart sounds.   No murmur heard. Pulmonary/Chest: Effort normal and breath sounds normal.  Abdominal: Soft. Bowel sounds are normal. He exhibits no distension. There is no tenderness.  Musculoskeletal: He exhibits no edema.  No midline spinal tenderness or step off  Neurological: He is alert and oriented to person, place, and time. He has normal reflexes. He exhibits normal muscle tone.  Fluent speech Normal sensation throughout 5/5 strength BLE  Skin: Skin is warm and dry. No rash noted. No erythema.  Psychiatric: He has a normal mood and affect. Judgment normal.  Nursing note and vitals reviewed.   ED Course  Procedures (including critical care time) Labs Review Labs Reviewed - No data to display  Medications  oxyCODONE-acetaminophen (PERCOCET/ROXICET) 5-325 MG per tablet 2 tablet (2 tablets Oral Given 05/21/16 0853)     MDM   Final diagnoses:  Chronic back pain greater than 3 months duration   Patient with acute on chronic back pain, was evaluated here last month for the same symptoms. He reports no improvement. He was awake and alert, in no acute distress on exam. Vital signs notable for hypertension. He states he is taking his medications. He is neurologically  intact. No symptoms to suggest coracoid or infectious processes. Gave a dose of Percocet here but stated that I do not prescribe narcotics for chronic pain. Instructed to follow-up with PCP for referral to physical therapy and specialist if needed. Return precautions reviewed and patient discharged in satisfactory condition.   Sharlett Iles, MD 05/21/16 [phone]

## 2016-05-21 NOTE — ED Notes (Signed)
Pt.presents with complaint of worsening back pain. States injury to back x 1 month ago, states taking ibuprofen at home with no improvement. Pt. Able to stand and get in bed independently.

## 2016-05-21 NOTE — Discharge Instructions (Signed)
IT IS VERY IMPORTANT FOR YOU TO FOLLOW UP WITH A PRIMARY CARE CLINIC FOR REFERRAL TO PHYSICAL THERAPY AND A SPECIALIST FOR YOUR BACK.

## 2016-05-21 NOTE — ED Notes (Signed)
Pt. Reports hx of HTN, states took his BP meds prior to coming to ED

## 2016-12-04 ENCOUNTER — Encounter (HOSPITAL_COMMUNITY): Payer: Self-pay | Admitting: Emergency Medicine

## 2016-12-04 ENCOUNTER — Emergency Department (HOSPITAL_COMMUNITY)
Admission: EM | Admit: 2016-12-04 | Discharge: 2016-12-06 | Disposition: A | Discharge status: Home / Self Care | Payer: Medicaid Other | Attending: Emergency Medicine | Admitting: Emergency Medicine

## 2016-12-04 DIAGNOSIS — Z8249 Family history of ischemic heart disease and other diseases of the circulatory system: Secondary | ICD-10-CM | POA: Diagnosis not present

## 2016-12-04 DIAGNOSIS — F17210 Nicotine dependence, cigarettes, uncomplicated: Secondary | ICD-10-CM | POA: Diagnosis not present

## 2016-12-04 DIAGNOSIS — F1020 Alcohol dependence, uncomplicated: Secondary | ICD-10-CM | POA: Insufficient documentation

## 2016-12-04 DIAGNOSIS — Z79899 Other long term (current) drug therapy: Secondary | ICD-10-CM | POA: Diagnosis not present

## 2016-12-04 DIAGNOSIS — F1414 Cocaine abuse with cocaine-induced mood disorder: Secondary | ICD-10-CM | POA: Insufficient documentation

## 2016-12-04 DIAGNOSIS — Z8673 Personal history of transient ischemic attack (TIA), and cerebral infarction without residual deficits: Secondary | ICD-10-CM | POA: Insufficient documentation

## 2016-12-04 DIAGNOSIS — E119 Type 2 diabetes mellitus without complications: Secondary | ICD-10-CM | POA: Insufficient documentation

## 2016-12-04 DIAGNOSIS — Z7984 Long term (current) use of oral hypoglycemic drugs: Secondary | ICD-10-CM | POA: Diagnosis not present

## 2016-12-04 DIAGNOSIS — F251 Schizoaffective disorder, depressive type: Secondary | ICD-10-CM | POA: Diagnosis not present

## 2016-12-04 DIAGNOSIS — Z7982 Long term (current) use of aspirin: Secondary | ICD-10-CM | POA: Insufficient documentation

## 2016-12-04 DIAGNOSIS — R45851 Suicidal ideations: Secondary | ICD-10-CM | POA: Diagnosis present

## 2016-12-04 LAB — BASIC METABOLIC PANEL
Anion gap: 12 (ref 5–15)
BUN: 11 mg/dL (ref 6–20)
CHLORIDE: 97 mmol/L — AB (ref 101–111)
CO2: 27 mmol/L (ref 22–32)
CREATININE: 1.01 mg/dL (ref 0.61–1.24)
Calcium: 8.8 mg/dL — ABNORMAL LOW (ref 8.9–10.3)
GFR calc Af Amer: >60 mL/min (ref >60)
GFR calc non Af Amer: >60 mL/min (ref >60)
GLUCOSE: 212 mg/dL — AB (ref 65–99)
Potassium: 3.5 mmol/L (ref 3.5–5.1)
SODIUM: 136 mmol/L (ref 135–145)

## 2016-12-04 LAB — CBC
HCT: 44.5 % (ref 39.0–52.0)
HEMOGLOBIN: 15.4 g/dL (ref 13.0–17.0)
MCH: 30.3 pg (ref 26.0–34.0)
MCHC: 34.6 g/dL (ref 30.0–36.0)
MCV: 87.4 fL (ref 78.0–100.0)
PLATELETS: 157 10*3/uL (ref 150–400)
RBC: 5.09 MIL/uL (ref 4.22–5.81)
RDW: 12.5 % (ref 11.5–15.5)
WBC: 8.6 10*3/uL (ref 4.0–10.5)

## 2016-12-04 LAB — ETHANOL: ALCOHOL ETHYL (B): 45 mg/dL — AB (ref <5)

## 2016-12-04 MED ORDER — PANTOPRAZOLE SODIUM 40 MG PO TBEC
40.0000 mg | DELAYED_RELEASE_TABLET | Freq: Every day | ORAL | Status: DC
  Administered 2016-12-04 – 2016-12-06 (×3): 40 mg via ORAL
  Filled 2016-12-04 (×3): qty 1

## 2016-12-04 MED ORDER — AMLODIPINE BESYLATE 5 MG PO TABS
10.0000 mg | ORAL_TABLET | Freq: Every day | ORAL | Status: DC
  Administered 2016-12-04 – 2016-12-06 (×3): 10 mg via ORAL
  Filled 2016-12-04 (×4): qty 2

## 2016-12-04 MED ORDER — RISPERIDONE 0.5 MG PO TABS
0.2500 mg | ORAL_TABLET | Freq: Two times a day (BID) | ORAL | Status: DC
  Administered 2016-12-04 – 2016-12-05 (×3): 0.25 mg via ORAL
  Filled 2016-12-04 (×3): qty 1

## 2016-12-04 MED ORDER — LISINOPRIL 20 MG PO TABS
20.0000 mg | ORAL_TABLET | Freq: Every day | ORAL | Status: DC
  Administered 2016-12-04 – 2016-12-06 (×3): 20 mg via ORAL
  Filled 2016-12-04 (×3): qty 1

## 2016-12-04 MED ORDER — GLIMEPIRIDE 1 MG PO TABS
1.0000 mg | ORAL_TABLET | Freq: Every day | ORAL | Status: DC
  Administered 2016-12-05 – 2016-12-06 (×2): 1 mg via ORAL
  Filled 2016-12-04 (×2): qty 1

## 2016-12-04 MED ORDER — MIRTAZAPINE 30 MG PO TABS
15.0000 mg | ORAL_TABLET | Freq: Every day | ORAL | Status: DC
  Administered 2016-12-04 – 2016-12-05 (×2): 15 mg via ORAL
  Filled 2016-12-04 (×2): qty 1

## 2016-12-04 MED ORDER — NICOTINE 21 MG/24HR TD PT24
21.0000 mg | MEDICATED_PATCH | Freq: Every day | TRANSDERMAL | Status: DC
  Filled 2016-12-04 (×3): qty 1

## 2016-12-04 MED ORDER — HYDRALAZINE HCL 50 MG PO TABS
50.0000 mg | ORAL_TABLET | Freq: Three times a day (TID) | ORAL | Status: DC
  Administered 2016-12-04 – 2016-12-06 (×6): 50 mg via ORAL
  Filled 2016-12-04 (×8): qty 1

## 2016-12-04 MED ORDER — METFORMIN HCL 500 MG PO TABS
1000.0000 mg | ORAL_TABLET | Freq: Two times a day (BID) | ORAL | Status: DC
  Administered 2016-12-04 – 2016-12-06 (×4): 1000 mg via ORAL
  Filled 2016-12-04 (×4): qty 2

## 2016-12-04 MED ORDER — ATENOLOL 50 MG PO TABS
50.0000 mg | ORAL_TABLET | Freq: Two times a day (BID) | ORAL | Status: DC
  Administered 2016-12-04 – 2016-12-06 (×5): 50 mg via ORAL
  Filled 2016-12-04 (×7): qty 1

## 2016-12-04 MED ORDER — CLONIDINE HCL 0.1 MG PO TABS
0.1000 mg | ORAL_TABLET | Freq: Three times a day (TID) | ORAL | Status: DC
  Administered 2016-12-04 – 2016-12-06 (×6): 0.1 mg via ORAL
  Filled 2016-12-04 (×5): qty 1

## 2016-12-04 MED ORDER — ONDANSETRON HCL 4 MG PO TABS: 4.0000 mg | ORAL_TABLET | Freq: Three times a day (TID) | ORAL | Status: DC | PRN

## 2016-12-04 MED ORDER — LORAZEPAM 1 MG PO TABS: 1.0000 mg | ORAL_TABLET | Freq: Three times a day (TID) | ORAL | Status: DC | PRN

## 2016-12-04 MED ORDER — ACETAMINOPHEN 325 MG PO TABS
650.0000 mg | ORAL_TABLET | ORAL | Status: DC | PRN
  Administered 2016-12-04: 650 mg via ORAL
  Filled 2016-12-04: qty 2

## 2016-12-04 MED ORDER — ZOLPIDEM TARTRATE 5 MG PO TABS: 5.0000 mg | ORAL_TABLET | Freq: Every evening | ORAL | Status: DC | PRN

## 2016-12-04 MED ORDER — IBUPROFEN 200 MG PO TABS: 600.0000 mg | ORAL_TABLET | Freq: Three times a day (TID) | ORAL | Status: DC | PRN

## 2016-12-04 NOTE — ED Notes (Signed)
Bed: [facility] Expected date:  Expected time:  Means of arrival:  Comments: EMS- hearing voices, move to room 10

## 2016-12-04 NOTE — ED Notes (Signed)
Introduced self to patient. Pt oriented to unit expectations.  Assessed pt for:  A) Anxiety &/or agitation: Pt does not report anxiety, and he said that he no longer feels SI or HI. He heard voices earlier, but not now. He does not appear to be responding to internal stimuli.   S) Safety: Pt discharged home. Discharged instructions read to pt who verbalized understanding. All belongings returned to pt who signed for same. Denies SI/HI, is not delusional and not responding to internal stimuli. Escorted pt to the ED exit.   A) ADLs: Pt able to perform ADLs independently.  P) Pick-Up (room cleanliness): [name] room clean and free of clutter.

## 2016-12-04 NOTE — ED Notes (Signed)
Pt dressed out in scrubs and wanded by security. Belongings placed at nurses station
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
## 2016-12-04 NOTE — ED Provider Notes (Signed)
[facility] DEPT Provider Note   CSN: [id number] Arrival date & time: 12/04/16  1018     History   Chief Complaint Chief Complaint  Patient presents with  . Suicidal    HPI [name] is a 56 y.o. male.  HPI Pt presents with increasing hallucinations and suicidal ideation.  He reports these are command hallucinations.  He reports compliance with medications.  He states he receives long-acting antipsychotic injections and that he has been receiving these.  He has no other complaints at this time   Past Medical History:  Diagnosis Date  . Alcohol abuse   . Arthritis   . Crack cocaine use   . Depression   . Diabetes mellitus   . Drug abuse   . DTs (delirium tremens) ([place])    history of   . Gout   . Noncompliance with medication regimen   . Schizophrenia ([place])   . Stroke ([place])   . Uncontrolled hypertension     Patient Active Problem List   Diagnosis Date Noted  . Cerebrovascular disease 09/02/2015  . 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 Malignant hypertension 08/30/2015  . Alcohol use disorder, severe, dependence ([place]) 08/30/2015  . Alcohol withdrawal ([place]) 08/30/2015  . 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 Thrombocytopenia ([place]) 08/27/2015  . Tobacco use disorder 08/26/2015  . Hyperlipidemia 04/28/2015  . Cocaine use disorder, severe, dependence ([place]) 04/27/2015  . Gout 08/09/2013  . Diabetes mellitus ([place]) 07/09/2012    Past Surgical History:  Procedure Laterality Date  . MANDIBLE RECONSTRUCTION         Home Medications    Prior to Admission medications   Medication Sig Start Date End Date Taking? Authorizing Provider  acetaminophen (TYLENOL) 325 MG tablet Take 650 mg by mouth every 6 (six) hours as needed for mild pain or headache.    Historical Provider, MD  amLODipine (NORVASC) 10 MG tablet Take 1 tablet (10 mg  total) by mouth daily. 04/18/16   [name], MD  aspirin 325 MG tablet Take 325 mg by mouth every 6 (six) hours as needed for mild pain.    Historical Provider, MD  atenolol (TENORMIN) 50 MG tablet Take 1 tablet (50 mg total) by mouth 2 (two) times daily. 04/18/16   [name], MD  cloNIDine (CATAPRES) 0.1 MG tablet Take 1 tablet (0.1 mg total) by mouth 3 (three) times daily. 04/18/16   [name], MD  cyclobenzaprine (FLEXERIL) 5 MG tablet Take 1 tablet (5 mg total) by mouth 3 (three) times daily as needed for muscle spasms. 04/18/16   [name], MD  diazepam (VALIUM) 5 MG tablet Take 1 tablet (5 mg total) by mouth every 8 (eight) hours as needed for muscle spasms. 05/21/16   Sharlett Iles, MD  glimepiride (AMARYL) 1 MG tablet Take 1 tablet (1 mg total) by mouth daily with breakfast. 09/07/15   Hildred Priest, MD  hydrALAZINE (APRESOLINE) 50 MG tablet Take 1 tablet (50 mg total) by mouth every 8 (eight) hours. 09/07/15   Hildred Priest, MD  ibuprofen (ADVIL,MOTRIN) 200 MG tablet Take 200 mg by mouth every 6 (six) hours as needed for moderate pain.    Historical Provider, MD  lisinopril (PRINIVIL,ZESTRIL) 20 MG tablet Take 1 tablet (20 mg total) by mouth daily. 04/18/16   [name], MD  metFORMIN (GLUCOPHAGE) 1000 MG tablet Take 1 tablet (1,000 mg total) by mouth 2 (two) times daily with a  meal. 09/07/15   Hildred Priest, MD  mirtazapine (REMERON) 15 MG tablet Take 1 tablet (15 mg total) by mouth at bedtime. 09/07/15   Hildred Priest, MD  pantoprazole (PROTONIX) 40 MG tablet Take 1 tablet (40 mg total) by mouth daily. 09/07/15   Hildred Priest, MD  predniSONE (DELTASONE) 50 MG tablet Take 1 tablet (50 mg total) by mouth daily. 04/18/16   [name], MD    Family History Family History  Problem Relation Age of Onset  . Hypertension      Social History Social History  Substance Use Topics  . Smoking status: Current Every Day  Smoker    Packs/day: 0.25    Types: Cigarettes  . Smokeless tobacco: Never Used  . Alcohol use 0.6 - 1.2 oz/week    1 - 2 Cans of beer per week     Comment: [facility]     Allergies   Patient has no known allergies.   Review of Systems Review of Systems  All other systems reviewed and are negative.    Physical Exam Updated Vital Signs BP (!) 173/114   Pulse 90   Temp 97.5 F (36.4 C) (Oral)   Resp 17   Ht 5\' 11"  (1.803 m)   Wt 185 lb (83.9 kg)   SpO2 100%   BMI 25.80 kg/m   Physical Exam  Constitutional: He is oriented to person, place, and time. He appears well-developed and well-nourished.  HENT:  Head: Normocephalic and atraumatic.  Eyes: EOM are normal.  Neck: Normal range of motion.  Cardiovascular: Normal rate, regular rhythm, normal heart sounds and intact distal pulses.   Pulmonary/Chest: Effort normal and breath sounds normal. No respiratory distress.  Abdominal: Soft. He exhibits no distension. There is no tenderness.  Musculoskeletal: Normal range of motion.  Neurological: He is alert and oriented to person, place, and time.  Skin: Skin is warm and dry.  Psychiatric:  Appears to be responding to internal stimuli  Nursing note and vitals reviewed.    ED Treatments / Results  Labs (all labs ordered are listed, but only abnormal results are displayed) Labs Reviewed  CBC  BASIC METABOLIC PANEL  ETHANOL  RAPID URINE DRUG SCREEN, HOSP PERFORMED    EKG  EKG Interpretation None       Radiology No results found.  Procedures Procedures (including critical care time)  Medications Ordered in ED Medications  zolpidem (AMBIEN) tablet 5 mg (not administered)  nicotine (NICODERM CQ - dosed in mg/24 hours) patch 21 mg (not administered)  ondansetron (ZOFRAN) tablet 4 mg (not administered)  ibuprofen (ADVIL,MOTRIN) tablet 600 mg (not administered)  acetaminophen (TYLENOL) tablet 650 mg (not administered)  LORazepam (ATIVAN) tablet 1 mg (not  administered)  amLODipine (NORVASC) tablet 10 mg (not administered)  atenolol (TENORMIN) tablet 50 mg (not administered)  cloNIDine (CATAPRES) tablet 0.1 mg (not administered)  glimepiride (AMARYL) tablet 1 mg (not administered)  hydrALAZINE (APRESOLINE) tablet 50 mg (not administered)  lisinopril (PRINIVIL,ZESTRIL) tablet 20 mg (not administered)  metFORMIN (GLUCOPHAGE) tablet 1,000 mg (not administered)  mirtazapine (REMERON) tablet 15 mg (not administered)  pantoprazole (PROTONIX) EC tablet 40 mg (not administered)     Initial Impression / Assessment and Plan / ED Course  I have reviewed the triage vital signs and the nursing notes.  Pertinent labs & imaging results that were available during my care of the patient were reviewed by me and considered in my medical decision making (see chart for details).  Clinical Course  Medically clear.  TTS to evaluate.  Final Clinical Impressions(s) / ED Diagnoses   Final diagnoses:  None    New Prescriptions New Prescriptions   No medications on file     Jola Schmidt, MD 12/04/16 1221

## 2016-12-05 ENCOUNTER — Encounter (HOSPITAL_COMMUNITY): Payer: Self-pay | Admitting: Emergency Medicine

## 2016-12-05 DIAGNOSIS — F1414 Cocaine abuse with cocaine-induced mood disorder: Secondary | ICD-10-CM | POA: Diagnosis not present

## 2016-12-05 DIAGNOSIS — Z79899 Other long term (current) drug therapy: Secondary | ICD-10-CM

## 2016-12-05 DIAGNOSIS — Z8249 Family history of ischemic heart disease and other diseases of the circulatory system: Secondary | ICD-10-CM | POA: Diagnosis not present

## 2016-12-05 DIAGNOSIS — F1020 Alcohol dependence, uncomplicated: Secondary | ICD-10-CM

## 2016-12-05 DIAGNOSIS — F17210 Nicotine dependence, cigarettes, uncomplicated: Secondary | ICD-10-CM

## 2016-12-05 LAB — RAPID URINE DRUG SCREEN, HOSP PERFORMED
AMPHETAMINES: NOT DETECTED
BARBITURATES: NOT DETECTED
Benzodiazepines: NOT DETECTED
Cocaine: POSITIVE — AB
OPIATES: NOT DETECTED
TETRAHYDROCANNABINOL: NOT DETECTED

## 2016-12-05 LAB — CBG MONITORING, ED: Glucose-Capillary: 192 mg/dL — ABNORMAL HIGH (ref 65–99)

## 2016-12-05 MED ORDER — RISPERIDONE 1 MG PO TABS
1.0000 mg | ORAL_TABLET | Freq: Two times a day (BID) | ORAL | Status: DC
  Administered 2016-12-05 – 2016-12-06 (×2): 1 mg via ORAL
  Filled 2016-12-05 (×2): qty 1
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
## 2016-12-05 NOTE — Progress Notes (Signed)
Pharmacy has not been able to verify the medications he has been taking at home. He doesn't know his meds at all. He states that [facility] fill unit-dose bubble packs for him but due to the weather they are not open. We will keep trying while he is here.   Romeo Rabon, PharmD, pager [phone]. 12/05/2016,12:21 PM.

## 2016-12-05 NOTE — ED Notes (Signed)
Patient currently denies SI, HI and AVH at this time. Plan of care discussed with patient. Patient voices no complaints or concerns at this time. Encouragement and support provided and safety maintain. Q 15 min safety checks remain in place.

## 2016-12-05 NOTE — Progress Notes (Signed)
Referral submitted to: [place], [facility], [facility], [place], [place], [place], Lometa Nash Shearer, L[facility], [facility]  Counselor 12/05/2016 8:12 PM

## 2016-12-05 NOTE — ED Notes (Signed)
SBAR Report received from previous nurse. Pt received calm and visible on unit. Pt denies current SI/ HI, A/V H, depression, anxiety, or pain at this time, and appears otherwise stable and free of distress. Pt reminded of camera surveillance, q 15 min rounds, and rules of the milieu. Will continue to assess. 

## 2016-12-05 NOTE — ED Notes (Signed)
Pt ate breakfast this morning. Pt is calm and cooperative. He reports voices telling him to hurt others and denies hi thoughts. Pt denies si thoughts. Pt is taking medications as prescribed. Safety maintained in the [facility].

## 2016-12-06 DIAGNOSIS — F1020 Alcohol dependence, uncomplicated: Secondary | ICD-10-CM | POA: Diagnosis not present

## 2016-12-06 DIAGNOSIS — R45851 Suicidal ideations: Secondary | ICD-10-CM

## 2016-12-06 DIAGNOSIS — F1414 Cocaine abuse with cocaine-induced mood disorder: Secondary | ICD-10-CM | POA: Diagnosis not present

## 2016-12-06 DIAGNOSIS — F251 Schizoaffective disorder, depressive type: Secondary | ICD-10-CM

## 2016-12-06 DIAGNOSIS — Z8249 Family history of ischemic heart disease and other diseases of the circulatory system: Secondary | ICD-10-CM | POA: Diagnosis not present

## 2016-12-06 MED ORDER — HYDROCHLOROTHIAZIDE 25 MG PO TABS
25.0000 mg | ORAL_TABLET | Freq: Every day | ORAL | Status: DC
  Administered 2016-12-06: 25 mg via ORAL
  Filled 2016-12-06: qty 1

## 2016-12-06 MED ORDER — METFORMIN HCL 500 MG PO TABS: 500.0000 mg | ORAL_TABLET | Freq: Two times a day (BID) | ORAL | Status: DC

## 2016-12-06 MED ORDER — CLONIDINE HCL 0.1 MG PO TABS: 0.1000 mg | ORAL_TABLET | Freq: Two times a day (BID) | ORAL | Status: DC

## 2016-12-06 MED ORDER — MELOXICAM 7.5 MG PO TABS
7.5000 mg | ORAL_TABLET | Freq: Two times a day (BID) | ORAL | Status: DC | PRN
  Filled 2016-12-06: qty 1

## 2016-12-06 MED ORDER — DULOXETINE HCL 30 MG PO CPEP
30.0000 mg | ORAL_CAPSULE | Freq: Two times a day (BID) | ORAL | Status: DC
  Administered 2016-12-06: 30 mg via ORAL
  Filled 2016-12-06: qty 1

## 2016-12-06 MED ORDER — AMLODIPINE BESYLATE 5 MG PO TABS: 5.0000 mg | ORAL_TABLET | Freq: Every day | ORAL | Status: DC

## 2016-12-06 NOTE — Discharge Instructions (Signed)
For your ongoing mental health needs, you are advised to follow up with the [facility] Team, your current outpatient provider:       [name]      [address], [place] [address]      [phone]   To help you maintain a sober lifestyle, a substance abuse treatment program may be beneficial to you.  Contact one of the following facilities at your earliest opportunity to ask about enrolling:  RESIDENTIAL PROGRAMS:       [facility]      [place], [place] [address]      [phone]       [facility]      [address] [place], [place] [address]      [phone]       [facility]      [place], [place] [address]      [phone]

## 2016-12-06 NOTE — Consult Note (Signed)
[name]   Reason for Consult:  Cocaine and alcohol abuse with hallucinations Referring Physician:  EDP Patient Identification: [name] MRN:  [id number] Principal Diagnosis: Cocaine abuse with cocaine-induced mood disorder [facility]) Diagnosis:   Patient Active Problem List   Diagnosis Date Noted  . Cocaine abuse with cocaine-induced mood disorder [facility]) [F14.14] 12/05/2016    Priority: High  . Alcohol use disorder, severe, dependence ([place]) [F10.20] 08/30/2015    Priority: High  . Cerebrovascular disease [I67.9] 09/02/2015  . 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 Malignant hypertension [I10] 08/30/2015  . Alcohol withdrawal ([place]) [F10.239] 08/30/2015  . 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 Thrombocytopenia ([place]) [D69.6] 08/27/2015  . Tobacco use disorder [F17.200] 08/26/2015  . Hyperlipidemia [E78.5] 04/28/2015  . Schizoaffective disorder, depressive type ([place]) [F25.1]   . Gout [M10.9] 08/09/2013  . Diabetes mellitus ([place]) [E11.9] 07/09/2012    Total Time spent with patient: 30 minutes  Subjective:   [name] is a 56 y.o. male patient has stabilized.  HPI:  56 yo male who presented to the ED after abusing cocaine and having hallucinations.  Medications were started and he stabilized over the past couple of days.  Today, he denies hallucinations, suicidal/homicidal ideations, and withdrawal symptoms.  [facility] from [facility] is coming to get him and take him home.  Stable for discharge  Past Psychiatric History: substance abuse, schizoaffective disorder  Risk to Self: Suicidal Ideation: No Suicidal Intent: No Is patient at risk for suicide?: No Suicidal Plan?: No Access to Means: No What has been your use of drugs/alcohol within the last 12 months?: crack cocaine and  alcohol How many times?: 1 Other Self Harm Risks: NA Triggers for Past Attempts: None known Intentional Self Injurious Behavior: None Risk to Others: Homicidal Ideation: No Thoughts of Harm to Others: No Current Homicidal Intent: No Current Homicidal Plan: No Access to Homicidal Means: No Identified Victim: NA History of harm to others?: No Assessment of Violence: None Noted Violent Behavior Description: NA Does patient have access to weapons?: No Criminal Charges Pending?: No Does patient have a court date: No Prior Inpatient Therapy: Prior Inpatient Therapy: Yes Prior Therapy Dates: multiple Prior Therapy Facilty/Provider(s): [facility] Reason for Treatment: depression Prior Outpatient Therapy: Prior Outpatient Therapy: Yes Prior Therapy Dates: current Prior Therapy Facilty/Provider(s): [name] Reason for Treatment: Schizophrenia Does patient have an ACCT team?: No Does patient have Intensive In-House Services?  : No Does patient have [name] services? : Yes Does patient have P4CC services?: No  Past Medical History:  Past Medical History:  Diagnosis Date  . Alcohol abuse   . Arthritis   . Crack cocaine use   . Depression   . Diabetes mellitus   . Drug abuse   . DTs (delirium tremens) ([place])    history of   . Gout   . Noncompliance with medication regimen   . Schizophrenia ([place])   . Stroke ([place])   . Uncontrolled hypertension     Past Surgical History:  Procedure Laterality Date  . MANDIBLE RECONSTRUCTION     Family History:  Family History  Problem Relation Age of Onset  . Hypertension     Family Psychiatric  History: none Social History:  History  Alcohol Use  . 0.6 - 1.2 oz/week  . 1 - 2 Cans of beer per week    Comment: [facility]  History  Drug Use  . Types: "Crack" cocaine, Cocaine    Social History   Social History  . Marital status: Legally Separated    Spouse name: N/A  . Number of children: N/A  . Years of education: N/A    Social History Main Topics  . Smoking status: Current Every Day Smoker    Packs/day: 0.25    Types: Cigarettes  . Smokeless tobacco: Never Used  . Alcohol use 0.6 - 1.2 oz/week    1 - 2 Cans of beer per week     Comment: [facility]  . Drug use: Yes    Types: "Crack" cocaine, Cocaine  . Sexual activity: Not Asked   Other Topics Concern  . None   Social History Narrative  . None   Additional Social History:    Allergies:  No Known Allergies  Labs:  Results for orders placed or performed during the hospital encounter of 12/04/16 (from the past 48 hour(s))  CBC     Status: None   Collection Time: 12/04/16 12:58 PM  Result Value Ref Range   WBC 8.6 4.0 - 10.5 K/uL   RBC 5.09 4.22 - 5.81 MIL/uL   Hemoglobin 15.4 13.0 - 17.0 g/dL   HCT 44.5 39.0 - 52.0 %   MCV 87.4 78.0 - 100.0 fL   MCH 30.3 26.0 - 34.0 pg   MCHC 34.6 30.0 - 36.0 g/dL   RDW 12.5 11.5 - 15.5 %   Platelets 157 150 - 400 K/uL  Basic metabolic panel     Status: Abnormal   Collection Time: 12/04/16 12:58 PM  Result Value Ref Range   Sodium 136 135 - 145 mmol/L   Potassium 3.5 3.5 - 5.1 mmol/L   Chloride 97 (L) 101 - 111 mmol/L   CO2 27 22 - 32 mmol/L   Glucose, Bld 212 (H) 65 - 99 mg/dL   BUN 11 6 - 20 mg/dL   Creatinine, Ser 1.01 0.61 - 1.24 mg/dL   Calcium 8.8 (L) 8.9 - 10.3 mg/dL   GFR calc non Af Amer >60 >60 mL/min   GFR calc Af Amer >60 >60 mL/min    Comment: (NOTE) The eGFR has been calculated using the CKD EPI equation. This calculation has not been validated in all clinical situations. eGFR's persistently <60 mL/min signify possible Chronic Kidney Disease.    Anion gap 12 5 - 15  Ethanol     Status: Abnormal   Collection Time: 12/04/16 12:58 PM  Result Value Ref Range   Alcohol, Ethyl (B) 45 (H) <5 mg/dL    Comment:        LOWEST DETECTABLE LIMIT FOR SERUM ALCOHOL IS 5 mg/dL FOR MEDICAL PURPOSES ONLY   CBG monitoring, ED     Status: Abnormal   Collection Time: 12/05/16  8:30 AM   Result Value Ref Range   Glucose-Capillary 192 (H) 65 - 99 mg/dL  Rapid urine drug screen (hospital performed)     Status: Abnormal   Collection Time: 12/05/16 10:25 AM  Result Value Ref Range   Opiates NONE DETECTED NONE DETECTED   Cocaine POSITIVE (A) NONE DETECTED   Benzodiazepines NONE DETECTED NONE DETECTED   Amphetamines NONE DETECTED NONE DETECTED   Tetrahydrocannabinol NONE DETECTED NONE DETECTED   Barbiturates NONE DETECTED NONE DETECTED    Comment:        DRUG SCREEN FOR MEDICAL PURPOSES ONLY.  IF CONFIRMATION IS NEEDED FOR ANY PURPOSE, NOTIFY LAB WITHIN 5 DAYS.  LOWEST DETECTABLE LIMITS FOR URINE DRUG SCREEN Drug Class       Cutoff (ng/mL) Amphetamine      1000 Barbiturate      200 Benzodiazepine   315 Tricyclics       400 Opiates          300 Cocaine          300 THC              50     Current Facility-Administered Medications  Medication Dose Route Frequency Provider Last Rate Last Dose  . acetaminophen (TYLENOL) tablet 650 mg  650 mg Oral Q4H PRN Jola Schmidt, MD   650 mg at 12/04/16 1313  . amLODipine (NORVASC) tablet 5 mg  5 mg Oral Daily Jordyn Doane, MD      . cloNIDine (CATAPRES) tablet 0.1 mg  0.1 mg Oral BID Squire Withey, MD      . DULoxetine (CYMBALTA) DR capsule 30 mg  30 mg Oral BID Corena Pilgrim, MD   30 mg at 12/06/16 1236  . hydrochlorothiazide (HYDRODIURIL) tablet 25 mg  25 mg Oral Daily Laquinda Moller, MD   25 mg at 12/06/16 1236  . meloxicam (MOBIC) tablet 7.5 mg  7.5 mg Oral BID PRN Corena Pilgrim, MD      . metFORMIN (GLUCOPHAGE) tablet 500 mg  500 mg Oral BID WC Lynsey Ange, MD      . nicotine (NICODERM CQ - dosed in mg/24 hours) patch 21 mg  21 mg Transdermal Daily Jola Schmidt, MD      . ondansetron [facility]) tablet 4 mg  4 mg Oral Q8H PRN Jola Schmidt, MD      . pantoprazole (PROTONIX) EC tablet 40 mg  40 mg Oral Daily Jola Schmidt, MD   40 mg at 12/06/16 8676   Current Outpatient Prescriptions  Medication Sig  Dispense Refill  . amLODipine (NORVASC) 5 MG tablet Take 5 mg by mouth daily.    . cloNIDine (CATAPRES) 0.1 MG tablet Take 0.1 mg by mouth 2 (two) times daily.    . DULoxetine (CYMBALTA) 30 MG capsule Take 30 mg by mouth 2 (two) times daily.    . hydrochlorothiazide (HYDRODIURIL) 25 MG tablet Take 25 mg by mouth daily.    . hydrOXYzine (ATARAX/VISTARIL) 25 MG tablet Take 25-50 mg by mouth 3 (three) times daily as needed for itching.    . meloxicam (MOBIC) 7.5 MG tablet Take 7.5 mg by mouth 2 (two) times daily as needed for pain.    . metFORMIN (GLUCOPHAGE) 500 MG tablet Take 500 mg by mouth 2 (two) times daily with a meal.    . paliperidone (INVEGA SUSTENNA) 156 MG/ML SUSP injection Inject 156 mg into the muscle every 30 (thirty) days.    Marland Kitchen tiZANidine (ZANAFLEX) 2 MG tablet Take 2-4 mg by mouth every 8 (eight) hours as needed for muscle spasms.    Marland Kitchen acetaminophen (TYLENOL) 325 MG tablet Take 650 mg by mouth every 6 (six) hours as needed for mild pain or headache.    Marland Kitchen aspirin 325 MG tablet Take 325 mg by mouth every 6 (six) hours as needed for mild pain.    Marland Kitchen ibuprofen (ADVIL,MOTRIN) 200 MG tablet Take 200 mg by mouth every 6 (six) hours as needed for moderate pain.      Musculoskeletal: Strength & Muscle Tone: within normal limits Gait & Station: normal Patient leans: N/A  Psychiatric Specialty Exam: Physical Exam  Constitutional: He is oriented to person, place, and  time. He appears well-developed and well-nourished.  HENT:  Head: Normocephalic.  Neck: Normal range of motion.  Respiratory: Effort normal.  Musculoskeletal: Normal range of motion.  Neurological: He is alert and oriented to person, place, and time.  Psychiatric: He has a normal mood and affect. His speech is normal and behavior is normal. Judgment and thought content normal. Cognition and memory are normal.    Review of Systems  Psychiatric/Behavioral: Positive for substance abuse.  All other systems reviewed and are  negative.   Blood pressure 145/94, pulse 74, temperature 98.9 F (37.2 C), temperature source Oral, resp. rate 18, height _0  (1.803 m), weight 83.9 kg (185 lb), SpO2 99 %.Body mass index is 25.8 kg/m.  General Appearance: Casual  Eye Contact:  Good  Speech:  Normal Rate  Volume:  Normal  Mood:  Euthymic  Affect:  Congruent  Thought Process:  Coherent and Descriptions of Associations: Intact  Orientation:  Full (Time, Place, and Person)  Thought Content:  WDL  Suicidal Thoughts:  No  Homicidal Thoughts:  No  Memory: Immediate, good; recent, good; remote, good  Judgement:  Fair  Insight:  Fair  Psychomotor Activity:  Normal  Concentration:  Concentration: Good and Attention Span: Good  Recall:  Good  Fund of Knowledge:  Fair  Language:  Good  Akathisia:  No  Handed:  Right  AIMS (if indicated):     Assets:  Leisure Time Physical Health Resilience Social Support  ADL's:  Intact  Cognition:  WNL  Sleep:   Good     Treatment Plan Summary: Daily contact with patient to assess and evaluate symptoms and progress in treatment, Medication management and Plan cocaine abuse with cocaine induced mood disorder:  -Crisis stabilization -Medication management:  Continued medical medications along with his Remeron 15 mg at bedtime for sleep, and  Risperdal from 1 mg BID for psychosis. -Individual and substance abuse counseling  Disposition: Discharge home with his ACT team  Waylan Boga, NP 12/06/2016 12:38 PM  Patient seen face-to-face for psychiatric evaluation, chart reviewed and case discussed with the physician extender and developed treatment plan. Reviewed the information documented and agree with the treatment plan. Corena Pilgrim, MD

## 2016-12-06 NOTE — Progress Notes (Signed)
The home med list has been updated using pharmacy records. There were MULTIPLE changes made. I notified [name] of this via phone.   Romeo Rabon, PharmD, pager [phone]. 12/06/2016,11:17 AM.

## 2016-12-06 NOTE — BH Assessment (Addendum)
[facility] Assessment Progress Note  A 12:22 [name] calls back from [place] ACT Team to report that [name] will be here to pick pt up around 14:00.  [name] Boga, DNP and pt's nurse, [name], have been informed.  [name] Mullet, MA Triage Specialist [phone]   Addendum:  Pt reportedly requests information about area residential substance abuse treatment programs.  This has been added to pt's discharge instructions.  [name], [place] Triage Specialist [phone]

## 2016-12-06 NOTE — [facility] Suicide Risk Assessment (Signed)
Suicide Risk Assessment  Discharge Assessment   [facility] Discharge Suicide Risk Assessment   Principal Problem: Cocaine abuse with cocaine-induced mood disorder [facility]) Discharge Diagnoses:  Patient Active Problem List   Diagnosis Date Noted  . Cocaine abuse with cocaine-induced mood disorder [facility]) [F14.14] 12/05/2016    Priority: High  . Alcohol use disorder, severe, dependence ([place]) [F10.20] 08/30/2015    Priority: High  . Cerebrovascular disease [I67.9] 09/02/2015  . 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 Malignant hypertension [I10] 08/30/2015  . Alcohol withdrawal ([place]) [F10.239] 08/30/2015  . 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 Thrombocytopenia ([place]) [D69.6] 08/27/2015  . Tobacco use disorder [F17.200] 08/26/2015  . Hyperlipidemia [E78.5] 04/28/2015  . Schizoaffective disorder, depressive type ([place]) [F25.1]   . Gout [M10.9] 08/09/2013  . Diabetes mellitus ([place]) [E11.9] 07/09/2012    Total Time spent with patient: 30 minutes  Musculoskeletal: Strength & Muscle Tone: within normal limits Gait & Station: normal Patient leans: N/A  Psychiatric Specialty Exam: Physical Exam  Constitutional: He is oriented to person, place, and time. He appears well-developed and well-nourished.  HENT:  Head: Normocephalic.  Neck: Normal range of motion.  Respiratory: Effort normal.  Musculoskeletal: Normal range of motion.  Neurological: He is alert and oriented to person, place, and time.  Psychiatric: He has a normal mood and affect. His speech is normal and behavior is normal. Judgment and thought content normal. Cognition and memory are normal.    Review of Systems  Psychiatric/Behavioral: Positive for substance abuse.  All other systems reviewed and are negative.   Blood pressure 145/94, pulse 74, temperature 98.9 F (37.2 C),  temperature source Oral, resp. rate 18, height 5\' 11"  (1.803 m), weight 83.9 kg (185 lb), SpO2 99 %.Body mass index is 25.8 kg/m.  General Appearance: Casual  Eye Contact:  Good  Speech:  Normal Rate  Volume:  Normal  Mood:  Euthymic  Affect:  Congruent  Thought Process:  Coherent and Descriptions of Associations: Intact  Orientation:  Full (Time, Place, and Person)  Thought Content:  WDL  Suicidal Thoughts:  No  Homicidal Thoughts:  No  Memory: Immediate, good; recent, good; remote, good  Judgement:  Fair  Insight:  Fair  Psychomotor Activity:  Normal  Concentration:  Concentration: Good and Attention Span: Good  Recall:  Good  Fund of Knowledge:  Fair  Language:  Good  Akathisia:  No  Handed:  Right  AIMS (if indicated):     Assets:  Leisure Time Physical Health Resilience Social Support  ADL's:  Intact  Cognition:  WNL  Sleep:   Good   Mental Status Per Nursing Assessment::   On Admission:   cocaine abuse with hallucinations  Demographic Factors:  Male  Loss Factors: NA  Historical Factors: NA  Risk Reduction Factors:   Sense of responsibility to family, Positive social support and Positive therapeutic relationship  Continued Clinical Symptoms:  None  Cognitive Features That Contribute To Risk:  None    Suicide Risk:  Minimal: No identifiable suicidal ideation.  Patients presenting with no risk factors but with morbid ruminations; may be classified as minimal risk based on the severity of the depressive symptoms    Plan Of Care/Follow-up recommendations:  Activity:  as tolerated Diet:  heart healthy diet  LORD, JAMISON, NP 12/06/2016, 12:44 PM

## 2016-12-06 NOTE — BH Assessment (Signed)
[facility] Assessment Progress Note  Per Corena Pilgrim, MD, this pt does not require psychiatric hospitalization at this time.  Pt is to be discharged from [facility] with recommendation to continue treatment with the [facility] Team, his current outpatient provider.  At Dr Marquis Buggy request, this writer called [facility] to arrange for pick up.  Call was placed at 12:10, and I spoke to [employer].  He agrees to come to ED for pt, but cannot say when.  Recommendation to continue treatment with [facility] Team has been included in pt's discharge instructions.  [name] nurse, Nena Jordan, has been notified.  Jalene Mullet, [place] Triage Specialist [phone]

## 2016-12-06 NOTE — ED Notes (Signed)
Pt discharged home with ACT Team per MD order. This nurse reviewed discharge summary with pt. Pt verbalizes understanding of discharge summary and resources. Pt denies SI/HI at discharge. Pt signed for personal property and property returned. Pt signed e-signature. Pt ambulatory off unit with ACT team member and [facility].

## 2017-01-08 ENCOUNTER — Encounter (HOSPITAL_COMMUNITY): Payer: Self-pay | Admitting: Emergency Medicine

## 2017-01-08 ENCOUNTER — Emergency Department (HOSPITAL_COMMUNITY)
Admission: EM | Admit: 2017-01-08 | Discharge: 2017-01-09 | Disposition: A | Discharge status: Home / Self Care | Payer: Medicaid Other | Attending: Emergency Medicine | Admitting: Emergency Medicine

## 2017-01-08 DIAGNOSIS — F17210 Nicotine dependence, cigarettes, uncomplicated: Secondary | ICD-10-CM | POA: Diagnosis not present

## 2017-01-08 DIAGNOSIS — Z7982 Long term (current) use of aspirin: Secondary | ICD-10-CM | POA: Insufficient documentation

## 2017-01-08 DIAGNOSIS — F1020 Alcohol dependence, uncomplicated: Secondary | ICD-10-CM | POA: Diagnosis not present

## 2017-01-08 DIAGNOSIS — Z8673 Personal history of transient ischemic attack (TIA), and cerebral infarction without residual deficits: Secondary | ICD-10-CM | POA: Insufficient documentation

## 2017-01-08 DIAGNOSIS — Z8659 Personal history of other mental and behavioral disorders: Secondary | ICD-10-CM

## 2017-01-08 DIAGNOSIS — I10 Essential (primary) hypertension: Secondary | ICD-10-CM | POA: Insufficient documentation

## 2017-01-08 DIAGNOSIS — Z79899 Other long term (current) drug therapy: Secondary | ICD-10-CM | POA: Diagnosis not present

## 2017-01-08 DIAGNOSIS — R440 Auditory hallucinations: Secondary | ICD-10-CM | POA: Diagnosis not present

## 2017-01-08 DIAGNOSIS — E119 Type 2 diabetes mellitus without complications: Secondary | ICD-10-CM | POA: Diagnosis not present

## 2017-01-08 DIAGNOSIS — Z7984 Long term (current) use of oral hypoglycemic drugs: Secondary | ICD-10-CM | POA: Insufficient documentation

## 2017-01-08 DIAGNOSIS — F1414 Cocaine abuse with cocaine-induced mood disorder: Secondary | ICD-10-CM | POA: Diagnosis not present

## 2017-01-08 DIAGNOSIS — R461 Bizarre personal appearance: Secondary | ICD-10-CM | POA: Diagnosis present

## 2017-01-08 LAB — COMPREHENSIVE METABOLIC PANEL
ALBUMIN: 3.8 g/dL (ref 3.5–5.0)
ALK PHOS: 74 U/L (ref 38–126)
ALT: 78 U/L — ABNORMAL HIGH (ref 17–63)
ANION GAP: 10 (ref 5–15)
AST: 120 U/L — ABNORMAL HIGH (ref 15–41)
BILIRUBIN TOTAL: 0.5 mg/dL (ref 0.3–1.2)
BUN: 14 mg/dL (ref 6–20)
CALCIUM: 9.3 mg/dL (ref 8.9–10.3)
CO2: 25 mmol/L (ref 22–32)
Chloride: 99 mmol/L — ABNORMAL LOW (ref 101–111)
Creatinine, Ser: 1.05 mg/dL (ref 0.61–1.24)
GLUCOSE: 510 mg/dL — AB (ref 65–99)
POTASSIUM: 3.9 mmol/L (ref 3.5–5.1)
Sodium: 134 mmol/L — ABNORMAL LOW (ref 135–145)
TOTAL PROTEIN: 7.7 g/dL (ref 6.5–8.1)

## 2017-01-08 LAB — ETHANOL: ALCOHOL ETHYL (B): 65 mg/dL — AB (ref <5)

## 2017-01-08 LAB — CBC WITH DIFFERENTIAL/PLATELET
BASOS ABS: 0 10*3/uL (ref 0.0–0.1)
BASOS PCT: 0 %
EOS ABS: 0.1 10*3/uL (ref 0.0–0.7)
Eosinophils Relative: 2 %
HCT: 40.6 % (ref 39.0–52.0)
HEMOGLOBIN: 13.9 g/dL (ref 13.0–17.0)
Lymphocytes Relative: 47 %
Lymphs Abs: 2.8 10*3/uL (ref 0.7–4.0)
MCH: 30 pg (ref 26.0–34.0)
MCHC: 34.2 g/dL (ref 30.0–36.0)
MCV: 87.5 fL (ref 78.0–100.0)
MONO ABS: 0.5 10*3/uL (ref 0.1–1.0)
MONOS PCT: 9 %
NEUTROS PCT: 42 %
Neutro Abs: 2.5 10*3/uL (ref 1.7–7.7)
Platelets: 134 10*3/uL — ABNORMAL LOW (ref 150–400)
RBC: 4.64 MIL/uL (ref 4.22–5.81)
RDW: 12 % (ref 11.5–15.5)
WBC: 6 10*3/uL (ref 4.0–10.5)

## 2017-01-08 LAB — CBG MONITORING, ED: GLUCOSE-CAPILLARY: 295 mg/dL — AB (ref 65–99)

## 2017-01-08 MED ORDER — DULOXETINE HCL 30 MG PO CPEP
30.0000 mg | ORAL_CAPSULE | Freq: Two times a day (BID) | ORAL | Status: DC
  Administered 2017-01-08 – 2017-01-09 (×2): 30 mg via ORAL
  Filled 2017-01-08 (×2): qty 1

## 2017-01-08 MED ORDER — IBUPROFEN 200 MG PO TABS: 200.0000 mg | ORAL_TABLET | Freq: Four times a day (QID) | ORAL | Status: DC | PRN

## 2017-01-08 MED ORDER — HYDROCHLOROTHIAZIDE 25 MG PO TABS
25.0000 mg | ORAL_TABLET | Freq: Every day | ORAL | Status: DC
  Administered 2017-01-08 – 2017-01-09 (×2): 25 mg via ORAL
  Filled 2017-01-08 (×3): qty 1

## 2017-01-08 MED ORDER — INSULIN ASPART 100 UNIT/ML ~~LOC~~ SOLN
15.0000 [IU] | Freq: Once | SUBCUTANEOUS | Status: AC
  Administered 2017-01-08: 15 [IU] via SUBCUTANEOUS
  Filled 2017-01-08: qty 1

## 2017-01-08 MED ORDER — INSULIN ASPART 100 UNIT/ML ~~LOC~~ SOLN
0.0000 [IU] | Freq: Three times a day (TID) | SUBCUTANEOUS | Status: DC
Start: 2017-01-09 — End: 2017-01-09
  Administered 2017-01-09 (×2): 5 [IU] via SUBCUTANEOUS
  Filled 2017-01-08 (×2): qty 1

## 2017-01-08 MED ORDER — CLONIDINE HCL 0.1 MG PO TABS
0.1000 mg | ORAL_TABLET | Freq: Two times a day (BID) | ORAL | Status: DC
  Administered 2017-01-08 – 2017-01-09 (×2): 0.1 mg via ORAL
  Filled 2017-01-08 (×2): qty 1

## 2017-01-08 MED ORDER — METFORMIN HCL 500 MG PO TABS
500.0000 mg | ORAL_TABLET | Freq: Two times a day (BID) | ORAL | Status: DC
  Administered 2017-01-09: 500 mg via ORAL
  Filled 2017-01-08: qty 1

## 2017-01-08 MED ORDER — AMLODIPINE BESYLATE 5 MG PO TABS
5.0000 mg | ORAL_TABLET | Freq: Every day | ORAL | Status: DC
  Administered 2017-01-08 – 2017-01-09 (×2): 5 mg via ORAL
  Filled 2017-01-08 (×2): qty 1

## 2017-01-08 MED ORDER — ACETAMINOPHEN 325 MG PO TABS
650.0000 mg | ORAL_TABLET | Freq: Four times a day (QID) | ORAL | Status: DC | PRN
  Administered 2017-01-09: 650 mg via ORAL
  Filled 2017-01-08: qty 2

## 2017-01-08 MED ORDER — INSULIN ASPART 100 UNIT/ML ~~LOC~~ SOLN: 0.0000 [IU] | Freq: Three times a day (TID) | SUBCUTANEOUS | Status: DC

## 2017-01-08 MED ORDER — HYDROXYZINE HCL 25 MG PO TABS: 25.0000 mg | ORAL_TABLET | Freq: Three times a day (TID) | ORAL | Status: DC | PRN
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
## 2017-01-08 NOTE — ED Notes (Signed)
[name] is aware of elevated blood sugar, awaiting orders.
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
## 2017-01-08 NOTE — ED Provider Notes (Signed)
[facility] DEPT Provider Note   CSN: [id number] Arrival date & time: 01/08/17  1913     History   Chief Complaint Chief Complaint  Patient presents with  . Hallucinations    HPI [name] is a 56 y.o. male.  The history is provided by the patient.  Mental Health Problem  Presenting symptoms: bizarre behavior, hallucinations and suicidal thoughts   Presenting symptoms: no suicidal threats and no suicide attempt   Degree of incapacity (severity):  Moderate Onset quality:  Gradual Duration:  1 week Timing:  Constant Progression:  Worsening Chronicity:  Recurrent Context: noncompliance   Treatment compliance:  Some of the time Time since last psychoactive medication taken:  1 week Relieved by:  Nothing Worsened by:  Alcohol Ineffective treatments:  None tried Associated symptoms: irritability and poor judgment   Risk factors: hx of mental illness and hx of suicide attempts (by OD)     Past Medical History:  Diagnosis Date  . Alcohol abuse   . Arthritis   . Crack cocaine use   . Depression   . Diabetes mellitus   . Drug abuse   . DTs (delirium tremens) ([place])    history of   . Gout   . Noncompliance with medication regimen   . Schizophrenia ([place])   . Stroke ([place])   . Uncontrolled hypertension     Patient Active Problem List   Diagnosis Date Noted  . Cocaine abuse with cocaine-induced mood disorder ([place]) 12/05/2016  . Cerebrovascular disease 09/02/2015  . 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 Malignant hypertension 08/30/2015  . Alcohol use disorder, severe, dependence ([place]) 08/30/2015  . Alcohol withdrawal ([place]) 08/30/2015  . 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 Thrombocytopenia ([place]) 08/27/2015  . Tobacco use disorder 08/26/2015  . Hyperlipidemia 04/28/2015  . Schizoaffective disorder, depressive type ([place])   . Gout  08/09/2013  . Diabetes mellitus ([place]) 07/09/2012    Past Surgical History:  Procedure Laterality Date  . MANDIBLE RECONSTRUCTION      no contributory family history   Home Medications    Prior to Admission medications   Medication Sig Start Date End Date Taking? Authorizing Provider  acetaminophen (TYLENOL) 325 MG tablet Take 650 mg by mouth every 6 (six) hours as needed for mild pain or headache.   Yes Historical Provider, MD  amLODipine (NORVASC) 5 MG tablet Take 5 mg by mouth daily.   Yes Historical Provider, MD  aspirin 325 MG tablet Take 325 mg by mouth every 6 (six) hours as needed for mild pain.   Yes Historical Provider, MD  cloNIDine (CATAPRES) 0.1 MG tablet Take 0.1 mg by mouth 2 (two) times daily.   Yes Historical Provider, MD  DULoxetine (CYMBALTA) 30 MG capsule Take 30 mg by mouth 2 (two) times daily.   Yes Historical Provider, MD  hydrochlorothiazide (HYDRODIURIL) 25 MG tablet Take 25 mg by mouth daily.   Yes Historical Provider, MD  hydrOXYzine (ATARAX/VISTARIL) 25 MG tablet Take 25-50 mg by mouth 3 (three) times daily as needed for itching.   Yes Historical Provider, MD  ibuprofen (ADVIL,MOTRIN) 200 MG tablet Take 200 mg by mouth every 6 (six) hours as needed for moderate pain.   Yes Historical Provider, MD  meloxicam (MOBIC) 7.5 MG tablet Take 7.5 mg by mouth 2 (two) times daily as needed for pain.   Yes Historical Provider, MD  metFORMIN (GLUCOPHAGE) 500 MG tablet Take 500 mg by  mouth 2 (two) times daily with a meal.   Yes Historical Provider, MD  paliperidone (INVEGA SUSTENNA) 156 MG/ML SUSP injection Inject 156 mg into the muscle every 30 (thirty) days.   Yes Historical Provider, MD  tiZANidine (ZANAFLEX) 2 MG tablet Take 2-4 mg by mouth every 8 (eight) hours as needed for muscle spasms.   Yes Historical Provider, MD    Family History Family History  Problem Relation Age of Onset  . Hypertension      Social History Social History  Substance Use Topics  .  Smoking status: Current Every Day Smoker    Packs/day: 0.25    Types: Cigarettes  . Smokeless tobacco: Never Used  . Alcohol use 0.6 - 1.2 oz/week    1 - 2 Cans of beer per week     Comment: [facility]     Allergies   Patient has no known allergies.   Review of Systems Review of Systems  Constitutional: Positive for irritability.  Psychiatric/Behavioral: Positive for hallucinations and suicidal ideas.  All other systems reviewed and are negative.    Physical Exam Updated Vital Signs BP (!) 195/131 (BP Location: Left Arm)   Pulse 109   Temp 98.5 F (36.9 C) (Oral)   Resp 18   Ht 5\' 11"  (1.803 m)   Wt 180 lb (81.6 kg)   SpO2 96%   BMI 25.10 kg/m   Physical Exam  Constitutional: He is oriented to person, place, and time. He appears well-developed and well-nourished. No distress.  HENT:  Head: Normocephalic and atraumatic.  Nose: Nose normal.  Eyes: Conjunctivae are normal.  Neck: Neck supple. No tracheal deviation present.  Cardiovascular: Normal rate and regular rhythm.   Pulmonary/Chest: Effort normal. No respiratory distress.  Abdominal: Soft. He exhibits no distension.  Neurological: He is alert and oriented to person, place, and time. He has normal strength. No cranial nerve deficit. GCS eye subscore is 4. GCS verbal subscore is 5. GCS motor subscore is 6.  Skin: Skin is warm and dry.  Psychiatric: His speech is normal. His mood appears anxious. Cognition and memory are impaired.     ED Treatments / Results  Labs (all labs ordered are listed, but only abnormal results are displayed) Labs Reviewed  COMPREHENSIVE METABOLIC PANEL  ETHANOL  CBC WITH DIFFERENTIAL/PLATELET  RAPID URINE DRUG SCREEN, HOSP PERFORMED    EKG  EKG Interpretation None       Radiology No results found.  Procedures Procedures (including critical care time)  Medications Ordered in ED Medications  acetaminophen (TYLENOL) tablet 650 mg (not administered)  amLODipine  (NORVASC) tablet 5 mg (not administered)  cloNIDine (CATAPRES) tablet 0.1 mg (not administered)  DULoxetine (CYMBALTA) DR capsule 30 mg (not administered)  hydrochlorothiazide (HYDRODIURIL) tablet 25 mg (not administered)  hydrOXYzine (ATARAX/VISTARIL) tablet 25-50 mg (not administered)  ibuprofen (ADVIL,MOTRIN) tablet 200 mg (not administered)  metFORMIN (GLUCOPHAGE) tablet 500 mg (not administered)     Initial Impression / Assessment and Plan / ED Course  I have reviewed the triage vital signs and the nursing notes.  Pertinent labs & imaging results that were available during my care of the patient were reviewed by me and considered in my medical decision making (see chart for details).     76 old male presents with hallucinations and he states they are command hallucinations that tell him to kill himself. He states that he is not suicidal and does not want to kill himself by the voices keep telling him to. He  has been out of medicine for the last week after he states someone stole them. He admits to drinking today over the course of the day but appears clinically sober and is able to converse appropriately. TTS consult in for evaluation voluntarily. Home medications restarted. MEDICALLY CLEAR FOR TRANSFER OR PSYCHIATRIC ADMISSION.   Final Clinical Impressions(s) / ED Diagnoses   Final diagnoses:  History of command hallucinations  Auditory hallucination    New Prescriptions New Prescriptions   No medications on file     Leo Grosser, MD 01/08/17 2120

## 2017-01-09 DIAGNOSIS — F1020 Alcohol dependence, uncomplicated: Secondary | ICD-10-CM | POA: Diagnosis not present

## 2017-01-09 DIAGNOSIS — F1414 Cocaine abuse with cocaine-induced mood disorder: Secondary | ICD-10-CM | POA: Diagnosis not present

## 2017-01-09 DIAGNOSIS — F17210 Nicotine dependence, cigarettes, uncomplicated: Secondary | ICD-10-CM | POA: Diagnosis not present

## 2017-01-09 DIAGNOSIS — Z79899 Other long term (current) drug therapy: Secondary | ICD-10-CM | POA: Diagnosis not present

## 2017-01-09 DIAGNOSIS — Z7982 Long term (current) use of aspirin: Secondary | ICD-10-CM

## 2017-01-09 LAB — RAPID URINE DRUG SCREEN, HOSP PERFORMED
AMPHETAMINES: NOT DETECTED
BARBITURATES: NOT DETECTED
BENZODIAZEPINES: NOT DETECTED
COCAINE: POSITIVE — AB
Opiates: NOT DETECTED
TETRAHYDROCANNABINOL: NOT DETECTED

## 2017-01-09 LAB — CBG MONITORING, ED
Glucose-Capillary: 149 mg/dL — ABNORMAL HIGH (ref 65–99)
Glucose-Capillary: 281 mg/dL — ABNORMAL HIGH (ref 65–99)
Glucose-Capillary: 273 mg/dL — ABNORMAL HIGH (ref 65–99)

## 2017-01-09 MED ORDER — AMLODIPINE BESYLATE 5 MG PO TABS: 5.0000 mg | ORAL_TABLET | Freq: Every day | ORAL | 0 refills | Status: DC

## 2017-01-09 MED ORDER — DULOXETINE HCL 30 MG PO CPEP: 30.0000 mg | ORAL_CAPSULE | Freq: Two times a day (BID) | ORAL | 0 refills | Status: DC

## 2017-01-09 MED ORDER — HYDROCHLOROTHIAZIDE 25 MG PO TABS: 25.0000 mg | ORAL_TABLET | Freq: Every day | ORAL | 0 refills | Status: DC

## 2017-01-09 MED ORDER — CLONIDINE HCL 0.1 MG PO TABS: 0.1000 mg | ORAL_TABLET | Freq: Two times a day (BID) | ORAL | 0 refills | Status: DC

## 2017-01-09 MED ORDER — METFORMIN HCL 500 MG PO TABS: 500.0000 mg | ORAL_TABLET | Freq: Two times a day (BID) | ORAL | 0 refills | Status: DC

## 2017-01-09 MED ORDER — HYDROXYZINE HCL 25 MG PO TABS: 25.0000 mg | ORAL_TABLET | Freq: Three times a day (TID) | ORAL | 0 refills | Status: DC | PRN

## 2017-01-09 NOTE — ED Notes (Signed)
Administered HTN meds at 0650 verbal order Dr. Randal Buba r/t BP

## 2017-01-09 NOTE — BHH Suicide Risk Assessment (Signed)
Suicide Risk Assessment  Discharge Assessment   [facility] Discharge Suicide Risk Assessment   Principal Problem: Cocaine abuse with cocaine-induced mood disorder [facility]) Discharge Diagnoses:  Patient Active Problem List   Diagnosis Date Noted  . Cocaine abuse with cocaine-induced mood disorder [facility]) [F14.14] 12/05/2016    Priority: High  . Alcohol use disorder, severe, dependence ([place]) [F10.20] 08/30/2015    Priority: High  . Cerebrovascular disease [I67.9] 09/02/2015  . 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 Malignant hypertension [I10] 08/30/2015  . Alcohol withdrawal ([place]) [F10.239] 08/30/2015  . 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 Thrombocytopenia ([place]) [D69.6] 08/27/2015  . Tobacco use disorder [F17.200] 08/26/2015  . Hyperlipidemia [E78.5] 04/28/2015  . Schizoaffective disorder, depressive type ([place]) [F25.1]   . Gout [M10.9] 08/09/2013  . Diabetes mellitus ([place]) [E11.9] 07/09/2012    Total Time spent with patient: 45 minutes   Musculoskeletal: Strength & Muscle Tone: within normal limits Gait & Station: normal Patient leans: N/A  Psychiatric Specialty Exam: Physical Exam  Constitutional: He is oriented to person, place, and time. He appears well-developed and well-nourished.  HENT:  Head: Normocephalic.  Neck: Normal range of motion.  Respiratory: Effort normal.  Musculoskeletal: Normal range of motion.  Neurological: He is alert and oriented to person, place, and time.  Psychiatric: He has a normal mood and affect. His speech is normal and behavior is normal. Judgment and thought content normal. Cognition and memory are normal.    Review of Systems  Psychiatric/Behavioral: Positive for substance abuse.  All other systems reviewed and are negative.   Blood pressure (!) 175/107, pulse 94, temperature 98.4 F (36.9  C), temperature source Oral, resp. rate 16, height 5\' 11"  (1.803 m), weight 81.6 kg (180 lb), SpO2 97 %.Body mass index is 25.1 kg/m.  General Appearance: Casual  Eye Contact:  Good  Speech:  Normal Rate  Volume:  Normal  Mood:  Euthymic  Affect:  Congruent  Thought Process:  Coherent and Descriptions of Associations: Intact  Orientation:  Full (Time, Place, and Person)  Thought Content:  WDL and Logical  Suicidal Thoughts:  No  Homicidal Thoughts:  No  Memory:  Immediate;   Good Recent;   Good Remote;   Good  Judgement:  Fair  Insight:  Fair  Psychomotor Activity:  Normal  Concentration:  Concentration: Good and Attention Span: Good  Recall:  Good  Fund of Knowledge:  Fair  Language:  Good  Akathisia:  No  Handed:  Right  AIMS (if indicated):     Assets:  Leisure Time Physical Health Resilience Social Support  ADL's:  Intact  Cognition:  WNL  Sleep:       Mental Status Per Nursing Assessment::   On Admission:   cocaine and alcohol abuse with hallucinations  Demographic Factors:  Male  Loss Factors: NA  Historical Factors: NA  Risk Reduction Factors:   Sense of responsibility to family and Positive therapeutic relationship  Continued Clinical Symptoms:  None  Cognitive Features That Contribute To Risk:  None    Suicide Risk:  Minimal: No identifiable suicidal ideation.  Patients presenting with no risk factors but with morbid ruminations; may be classified as minimal risk based on the severity of the depressive symptoms    Plan Of Care/Follow-up recommendations:  Activity:  as tolerated Diet:  heart healthy diet  Analeah Brame, NP 01/09/2017, 10:17 AM

## 2017-01-09 NOTE — BH Assessment (Addendum)
[facility] Assessment Progress Note  Per Corena Pilgrim, MD, this pt does not require psychiatric hospitalization at this time.  Pt is to be discharged from [facility] with recommendation to follow up with the [facility], pt's current outpatient provider.  This has been included in pt's discharge instructions.  [name] nurse, Dawnaly, has been notified.  Jalene Mullet, MA Triage Specialist [phone]   Addendum:  At 10:25 this Probation officer spoke to Karsten Fells with the [employer], who was in the ED to see another patient.  I notified him that this pt will also need ACT Team transport, and he replied that someone else would be coming for him.  After waiting for some time, at 13:05 I called [name] to confirm that a ride will be coming.  I spoke to [place], and she reports that someone will pick the pt up around 15:15.  Jalene Mullet, [place] Triage Specialist [phone]

## 2017-01-09 NOTE — Discharge Instructions (Signed)
For your ongoing behavioral health needs, you are advised to continue treatment with the [facility], your current outpatient provider:  For your ongoing mental health needs, you are advised to follow up with [name].  New and returning patients are seen at their walk-in clinic.  Walk-in hours are Monday - Friday from 8:00 am - 3:00 pm.  Walk-in patients are seen on a first come, first served basis.  Try to arrive as early as possible for he best chance of being seen the same day:       [name]      [address]      [place], [place] [address]      Crisis:         [phone]      ACT Team: [phone]

## 2017-01-09 NOTE — ED Notes (Signed)
Patient denies thoughts of harm to self or others.  He was discharged home with his ACT team.  All belongings were returned and signed for.  Reviewed all discharge instructions with patient and he was able to verbalize understanding.  He left the unit ambulatory and was escorted to the front lobby.  His ACT team worker was there to pick him up and patient requested that I review discharge instructions with the worker, which I did.

## 2017-01-09 NOTE — Consult Note (Signed)
[facility] Face-to-Face Psychiatry Consult   Reason for Consult:  Cocaine abuse with psychosis Referring Physician:  EDP Patient Identification: [name] MRN:  [id number] Principal Diagnosis: Cocaine abuse with cocaine-induced mood disorder [facility]) Diagnosis:   Patient Active Problem List   Diagnosis Date Noted  . Cocaine abuse with cocaine-induced mood disorder [facility]) [F14.14] 12/05/2016    Priority: High  . Alcohol use disorder, severe, dependence ([place]) [F10.20] 08/30/2015    Priority: High  . Cerebrovascular disease [I67.9] 09/02/2015  . 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 Malignant hypertension [I10] 08/30/2015  . Alcohol withdrawal ([place]) [F10.239] 08/30/2015  . 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 Thrombocytopenia ([place]) [D69.6] 08/27/2015  . Tobacco use disorder [F17.200] 08/26/2015  . Hyperlipidemia [E78.5] 04/28/2015  . Schizoaffective disorder, depressive type ([place]) [F25.1]   . Gout [M10.9] 08/09/2013  . Diabetes mellitus ([place]) [E11.9] 07/09/2012    Total Time spent with patient: 45 minutes  Subjective:   [name] is a 56 y.o. male patient does not warrant admission.  HPI:  56 yo male who came to the ED after abusing alcohol or cocaine and having psychosis.  Today, he is clear and coherent with no suicidal/homicidal ideations, hallucinations, or withdrawal symptoms.  Stable for discharge, ACT team notified.  Past Psychiatric History: polysubstance abuse, schizoaffective disorder  Risk to Self: Suicidal Ideation: No Suicidal Intent: No Is patient at risk for suicide?: No Suicidal Plan?: No Access to Means: No What has been your use of drugs/alcohol within the last 12 months?: ETOH How many times?: 1 Other Self Harm Risks: None Triggers for Past Attempts: None known Intentional Self Injurious  Behavior: None Risk to Others: Homicidal Ideation: No Thoughts of Harm to Others: No Current Homicidal Intent: No Current Homicidal Plan: No Access to Homicidal Means: No Identified Victim: No one History of harm to others?: No Assessment of Violence: None Noted Violent Behavior Description: None reported Does patient have access to weapons?: No Criminal Charges Pending?: No Does patient have a court date: No Prior Inpatient Therapy: Prior Inpatient Therapy: Yes Prior Therapy Dates: multiple Prior Therapy Facilty/Provider(s): [facility] Reason for Treatment: depression Prior Outpatient Therapy: Prior Outpatient Therapy: Yes Prior Therapy Dates: current Prior Therapy Facilty/Provider(s): [name] Reason for Treatment: [name] team Does patient have an ACCT team?: Yes Does patient have Intensive In-House Services?  : No Does patient have [name] services? : Yes Does patient have P4CC services?: No  Past Medical History:  Past Medical History:  Diagnosis Date  . Alcohol abuse   . Arthritis   . Crack cocaine use   . Depression   . Diabetes mellitus   . Drug abuse   . DTs (delirium tremens) ([place])    history of   . Gout   . Noncompliance with medication regimen   . Schizophrenia ([place])   . Stroke ([place])   . Uncontrolled hypertension     Past Surgical History:  Procedure Laterality Date  . MANDIBLE RECONSTRUCTION     Family History:  Family History  Problem Relation Age of Onset  . Hypertension     Family Psychiatric  History: none Social History:  History  Alcohol Use  . 0.6 - 1.2 oz/week  . 1 - 2 Cans of beer per week    Comment: [facility]     History  Drug Use  . Types: "Crack" cocaine, Cocaine    Social History  Social History  . Marital status: Legally Separated    Spouse name: N/A  . Number of children: N/A  . Years of education: N/A   Social History Main Topics  . Smoking status: Current Every Day Smoker    Packs/day:  0.25    Types: Cigarettes  . Smokeless tobacco: Never Used  . Alcohol use 0.6 - 1.2 oz/week    1 - 2 Cans of beer per week     Comment: [facility]  . Drug use: Yes    Types: "Crack" cocaine, Cocaine  . Sexual activity: Not on file   Other Topics Concern  . Not on file   Social History Narrative  . No narrative on file   Additional Social History:    Allergies:  No Known Allergies  Labs:  Results for orders placed or performed during the hospital encounter of 01/08/17 (from the past 48 hour(s))  Comprehensive metabolic panel     Status: Abnormal   Collection Time: 01/08/17  9:11 PM  Result Value Ref Range   Sodium 134 (L) 135 - 145 mmol/L   Potassium 3.9 3.5 - 5.1 mmol/L   Chloride 99 (L) 101 - 111 mmol/L   CO2 25 22 - 32 mmol/L   Glucose, Bld 510 (HH) 65 - 99 mg/dL    Comment: CRITICAL RESULT CALLED TO, READ BACK BY AND VERIFIED WITH: Ethelda Chick [id number] @ 2148 BY J SCOTTON    BUN 14 6 - 20 mg/dL   Creatinine, Ser 1.05 0.61 - 1.24 mg/dL   Calcium 9.3 8.9 - 10.3 mg/dL   Total Protein 7.7 6.5 - 8.1 g/dL   Albumin 3.8 3.5 - 5.0 g/dL   AST 120 (H) 15 - 41 U/L   ALT 78 (H) 17 - 63 U/L   Alkaline Phosphatase 74 38 - 126 U/L   Total Bilirubin 0.5 0.3 - 1.2 mg/dL   GFR calc non Af Amer >60 >60 mL/min   GFR calc Af Amer >60 >60 mL/min    Comment: (NOTE) The eGFR has been calculated using the CKD EPI equation. This calculation has not been validated in all clinical situations. eGFR's persistently <60 mL/min signify possible Chronic Kidney Disease.    Anion gap 10 5 - 15  CBC with Diff     Status: Abnormal   Collection Time: 01/08/17  9:11 PM  Result Value Ref Range   WBC 6.0 4.0 - 10.5 K/uL   RBC 4.64 4.22 - 5.81 MIL/uL   Hemoglobin 13.9 13.0 - 17.0 g/dL   HCT 40.6 39.0 - 52.0 %   MCV 87.5 78.0 - 100.0 fL   MCH 30.0 26.0 - 34.0 pg   MCHC 34.2 30.0 - 36.0 g/dL   RDW 12.0 11.5 - 15.5 %   Platelets 134 (L) 150 - 400 K/uL   Neutrophils Relative % 42 %   Neutro  Abs 2.5 1.7 - 7.7 K/uL   Lymphocytes Relative 47 %   Lymphs Abs 2.8 0.7 - 4.0 K/uL   Monocytes Relative 9 %   Monocytes Absolute 0.5 0.1 - 1.0 K/uL   Eosinophils Relative 2 %   Eosinophils Absolute 0.1 0.0 - 0.7 K/uL   Basophils Relative 0 %   Basophils Absolute 0.0 0.0 - 0.1 K/uL  Ethanol     Status: Abnormal   Collection Time: 01/08/17  9:12 PM  Result Value Ref Range   Alcohol, Ethyl (B) 65 (H) <5 mg/dL    Comment:  LOWEST DETECTABLE LIMIT FOR SERUM ALCOHOL IS 5 mg/dL FOR MEDICAL PURPOSES ONLY   Urine rapid drug screen (hosp performed)not at [facility]     Status: Abnormal   Collection Time: 01/08/17 11:47 PM  Result Value Ref Range   Opiates NONE DETECTED NONE DETECTED   Cocaine POSITIVE (A) NONE DETECTED   Benzodiazepines NONE DETECTED NONE DETECTED   Amphetamines NONE DETECTED NONE DETECTED   Tetrahydrocannabinol NONE DETECTED NONE DETECTED   Barbiturates NONE DETECTED NONE DETECTED    Comment:        DRUG SCREEN FOR MEDICAL PURPOSES ONLY.  IF CONFIRMATION IS NEEDED FOR ANY PURPOSE, NOTIFY LAB WITHIN 5 DAYS.        LOWEST DETECTABLE LIMITS FOR URINE DRUG SCREEN Drug Class       Cutoff (ng/mL) Amphetamine      1000 Barbiturate      200 Benzodiazepine   284 Tricyclics       132 Opiates          300 Cocaine          300 THC              50   CBG monitoring, ED     Status: Abnormal   Collection Time: 01/09/17 12:00 AM  Result Value Ref Range   Glucose-Capillary 295 (H) 65 - 99 mg/dL  POC CBG, ED     Status: Abnormal   Collection Time: 01/09/17  3:43 AM  Result Value Ref Range   Glucose-Capillary 149 (H) 65 - 99 mg/dL  CBG monitoring, ED     Status: Abnormal   Collection Time: 01/09/17  7:50 AM  Result Value Ref Range   Glucose-Capillary 281 (H) 65 - 99 mg/dL    Current Facility-Administered Medications  Medication Dose Route Frequency Provider Last Rate Last Dose  . acetaminophen (TYLENOL) tablet 650 mg  650 mg Oral Q6H PRN Leo Grosser, MD   650 mg at  01/09/17 0012  . amLODipine (NORVASC) tablet 5 mg  5 mg Oral Daily Leo Grosser, MD   5 mg at 01/09/17 0650  . cloNIDine (CATAPRES) tablet 0.1 mg  0.1 mg Oral BID Leo Grosser, MD   0.1 mg at 01/09/17 0650  . DULoxetine (CYMBALTA) DR capsule 30 mg  30 mg Oral BID Leo Grosser, MD   30 mg at 01/09/17 4401  . hydrochlorothiazide (HYDRODIURIL) tablet 25 mg  25 mg Oral Daily Leo Grosser, MD   25 mg at 01/09/17 [phone]  . hydrOXYzine (ATARAX/VISTARIL) tablet 25-50 mg  25-50 mg Oral TID PRN Leo Grosser, MD      . ibuprofen (ADVIL,MOTRIN) tablet 200 mg  200 mg Oral Q6H PRN Leo Grosser, MD      . insulin aspart (novoLOG) injection 0-9 Units  0-9 Units Subcutaneous TID WC Leo Grosser, MD   5 Units at 01/09/17 0759  . metFORMIN (GLUCOPHAGE) tablet 500 mg  500 mg Oral BID WC Leo Grosser, MD   500 mg at 01/09/17 5366   Current Outpatient Prescriptions  Medication Sig Dispense Refill  . acetaminophen (TYLENOL) 325 MG tablet Take 650 mg by mouth every 6 (six) hours as needed for mild pain or headache.    Marland Kitchen amLODipine (NORVASC) 5 MG tablet Take 5 mg by mouth daily.    Marland Kitchen aspirin 325 MG tablet Take 325 mg by mouth every 6 (six) hours as needed for mild pain.    . cloNIDine (CATAPRES) 0.1 MG tablet Take 0.1 mg by mouth 2 (two) times daily.    Marland Kitchen  DULoxetine (CYMBALTA) 30 MG capsule Take 30 mg by mouth 2 (two) times daily.    . hydrochlorothiazide (HYDRODIURIL) 25 MG tablet Take 25 mg by mouth daily.    . hydrOXYzine (ATARAX/VISTARIL) 25 MG tablet Take 25-50 mg by mouth 3 (three) times daily as needed for itching.    Marland Kitchen ibuprofen (ADVIL,MOTRIN) 200 MG tablet Take 200 mg by mouth every 6 (six) hours as needed for moderate pain.    . meloxicam (MOBIC) 7.5 MG tablet Take 7.5 mg by mouth 2 (two) times daily as needed for pain.    . metFORMIN (GLUCOPHAGE) 500 MG tablet Take 500 mg by mouth 2 (two) times daily with a meal.    . paliperidone (INVEGA SUSTENNA) 156 MG/ML SUSP injection Inject 156 mg into the muscle every  30 (thirty) days.    Marland Kitchen tiZANidine (ZANAFLEX) 2 MG tablet Take 2-4 mg by mouth every 8 (eight) hours as needed for muscle spasms.      Musculoskeletal: Strength & Muscle Tone: within normal limits Gait & Station: normal Patient leans: N/A  Psychiatric Specialty Exam: Physical Exam  Constitutional: He is oriented to person, place, and time. He appears well-developed and well-nourished.  HENT:  Head: Normocephalic.  Neck: Normal range of motion.  Respiratory: Effort normal.  Musculoskeletal: Normal range of motion.  Neurological: He is alert and oriented to person, place, and time.  Psychiatric: He has a normal mood and affect. His speech is normal and behavior is normal. Judgment and thought content normal. Cognition and memory are normal.    Review of Systems  Psychiatric/Behavioral: Positive for substance abuse.  All other systems reviewed and are negative.   Blood pressure (!) 175/107, pulse 94, temperature 98.4 F (36.9 C), temperature source Oral, resp. rate 16, height '5\' 11"'  (1.803 m), weight 81.6 kg (180 lb), SpO2 97 %.Body mass index is 25.1 kg/m.  General Appearance: Casual  Eye Contact:  Good  Speech:  Normal Rate  Volume:  Normal  Mood:  Euthymic  Affect:  Congruent  Thought Process:  Coherent and Descriptions of Associations: Intact  Orientation:  Full (Time, Place, and Person)  Thought Content:  WDL and Logical  Suicidal Thoughts:  No  Homicidal Thoughts:  No  Memory:  Immediate;   Good Recent;   Good Remote;   Good  Judgement:  Fair  Insight:  Fair  Psychomotor Activity:  Normal  Concentration:  Concentration: Good and Attention Span: Good  Recall:  Good  Fund of Knowledge:  Fair  Language:  Good  Akathisia:  No  Handed:  Right  AIMS (if indicated):     Assets:  Leisure Time Physical Health Resilience Social Support  ADL's:  Intact  Cognition:  WNL  Sleep:        Treatment Plan Summary: Daily contact with patient to assess and evaluate  symptoms and progress in treatment, Medication management and Plan cocaine abuse with cocaine induced mood disorder:  -Crisis stabilization -Medication management:  Continue medical medications along with Cymbalta 30 mg BID for depression and Vistaril 25 mg TID PRN anxiety -Individual and substance abuse counseling  Disposition: No evidence of imminent risk to self or others at present.    Waylan Boga, NP 01/09/2017 10:11 AM  Patient seen face-to-face for psychiatric evaluation, chart reviewed and case discussed with the physician extender and developed treatment plan. Reviewed the information documented and agree with the treatment plan. Corena Pilgrim, MD

## 2017-01-09 NOTE — Progress Notes (Signed)
Inpatient Diabetes Program Recommendations  AACE/ADA: New Consensus Statement on Inpatient Glycemic Control (2015)  Target Ranges:  Prepandial:   less than 140 mg/dL      Peak postprandial:   less than 180 mg/dL (1-2 hours)      Critically ill patients:  140 - 180 mg/dL   Lab Results  Component Value Date   FWYOVZ 858 (H) 01/09/2017   HGBA1C 7.6 (H) 08/30/2015    Review of Glycemic Control  Diabetes history: DM2 Outpatient Diabetes medications: metformin 500 mg bid (not taking) Current orders for Inpatient glycemic control: Novolog 0-9 units tidwc, metformin 500 bid  HgbA1C pending. Last one 08/30/2015 - 7.6%.  Inpatient Diabetes Program Recommendations:    Add Novolog 3 units tidwc for meal coverage insulin. Add HS correction. Add CHO mod med to diet.  Will follow.  Thank you. Lorenda Peck, RD, LDN, CDE Inpatient Diabetes Coordinator [phone]

## 2017-02-08 ENCOUNTER — Emergency Department (HOSPITAL_COMMUNITY): Payer: Medicaid Other

## 2017-02-08 ENCOUNTER — Encounter (HOSPITAL_COMMUNITY): Payer: Self-pay | Admitting: Emergency Medicine

## 2017-02-08 ENCOUNTER — Emergency Department (HOSPITAL_COMMUNITY)
Admission: EM | Admit: 2017-02-08 | Discharge: 2017-02-10 | Disposition: A | Discharge status: Home / Self Care | Payer: Medicaid Other | Attending: Emergency Medicine | Admitting: Emergency Medicine

## 2017-02-08 DIAGNOSIS — Z8673 Personal history of transient ischemic attack (TIA), and cerebral infarction without residual deficits: Secondary | ICD-10-CM | POA: Diagnosis not present

## 2017-02-08 DIAGNOSIS — R443 Hallucinations, unspecified: Secondary | ICD-10-CM

## 2017-02-08 DIAGNOSIS — F1910 Other psychoactive substance abuse, uncomplicated: Secondary | ICD-10-CM | POA: Diagnosis not present

## 2017-02-08 DIAGNOSIS — R079 Chest pain, unspecified: Secondary | ICD-10-CM | POA: Diagnosis present

## 2017-02-08 DIAGNOSIS — R45851 Suicidal ideations: Secondary | ICD-10-CM | POA: Diagnosis not present

## 2017-02-08 DIAGNOSIS — E1165 Type 2 diabetes mellitus with hyperglycemia: Secondary | ICD-10-CM | POA: Diagnosis not present

## 2017-02-08 DIAGNOSIS — Z7984 Long term (current) use of oral hypoglycemic drugs: Secondary | ICD-10-CM | POA: Diagnosis not present

## 2017-02-08 DIAGNOSIS — F251 Schizoaffective disorder, depressive type: Secondary | ICD-10-CM | POA: Diagnosis not present

## 2017-02-08 DIAGNOSIS — R0789 Other chest pain: Secondary | ICD-10-CM | POA: Insufficient documentation

## 2017-02-08 DIAGNOSIS — F1414 Cocaine abuse with cocaine-induced mood disorder: Secondary | ICD-10-CM | POA: Diagnosis present

## 2017-02-08 DIAGNOSIS — Z79899 Other long term (current) drug therapy: Secondary | ICD-10-CM | POA: Diagnosis not present

## 2017-02-08 DIAGNOSIS — F1014 Alcohol abuse with alcohol-induced mood disorder: Secondary | ICD-10-CM | POA: Diagnosis not present

## 2017-02-08 DIAGNOSIS — R739 Hyperglycemia, unspecified: Secondary | ICD-10-CM

## 2017-02-08 DIAGNOSIS — F17210 Nicotine dependence, cigarettes, uncomplicated: Secondary | ICD-10-CM | POA: Diagnosis not present

## 2017-02-08 LAB — URINALYSIS, ROUTINE W REFLEX MICROSCOPIC
BILIRUBIN URINE: NEGATIVE
Glucose, UA: >=500 mg/dL — AB
Ketones, ur: NEGATIVE mg/dL
LEUKOCYTES UA: NEGATIVE
NITRITE: NEGATIVE
Protein, ur: 30 mg/dL — AB
Specific Gravity, Urine: 1.026 (ref 1.005–1.030)
Squamous Epithelial / LPF: NONE SEEN
pH: 6 (ref 5.0–8.0)

## 2017-02-08 LAB — CBC
HCT: 40.2 % (ref 39.0–52.0)
Hemoglobin: 13.4 g/dL (ref 13.0–17.0)
MCH: 29.3 pg (ref 26.0–34.0)
MCHC: 33.3 g/dL (ref 30.0–36.0)
MCV: 87.8 fL (ref 78.0–100.0)
PLATELETS: 137 10*3/uL — AB (ref 150–400)
RBC: 4.58 MIL/uL (ref 4.22–5.81)
RDW: 12.6 % (ref 11.5–15.5)
WBC: 6.3 10*3/uL (ref 4.0–10.5)

## 2017-02-08 LAB — COMPREHENSIVE METABOLIC PANEL
ALK PHOS: 72 U/L (ref 38–126)
ALT: 68 U/L — AB (ref 17–63)
AST: 147 U/L — ABNORMAL HIGH (ref 15–41)
Albumin: 3.6 g/dL (ref 3.5–5.0)
Anion gap: 9 (ref 5–15)
BUN: 18 mg/dL (ref 6–20)
CALCIUM: 9.3 mg/dL (ref 8.9–10.3)
CHLORIDE: 91 mmol/L — AB (ref 101–111)
CO2: 33 mmol/L — ABNORMAL HIGH (ref 22–32)
CREATININE: 1.28 mg/dL — AB (ref 0.61–1.24)
GFR calc Af Amer: >60 mL/min (ref >60)
Glucose, Bld: 663 mg/dL (ref 65–99)
Potassium: 3.8 mmol/L (ref 3.5–5.1)
Sodium: 133 mmol/L — ABNORMAL LOW (ref 135–145)
TOTAL PROTEIN: 7.4 g/dL (ref 6.5–8.1)
Total Bilirubin: 1.1 mg/dL (ref 0.3–1.2)

## 2017-02-08 LAB — CBG MONITORING, ED
Glucose-Capillary: >600 mg/dL (ref 65–99)
Glucose-Capillary: 475 mg/dL — ABNORMAL HIGH (ref 65–99)
Glucose-Capillary: 455 mg/dL — ABNORMAL HIGH (ref 65–99)

## 2017-02-08 LAB — BLOOD GAS, VENOUS
Acid-Base Excess: 6.9 mmol/L — ABNORMAL HIGH (ref 0.0–2.0)
Bicarbonate: 33.6 mmol/L — ABNORMAL HIGH (ref 20.0–28.0)
FIO2: 0.21
O2 Saturation: 49.4 %
PCO2 VEN: 59.4 mmHg (ref 44.0–60.0)
PH VEN: 7.371 (ref 7.250–7.430)
Patient temperature: 98.6

## 2017-02-08 LAB — I-STAT CHEM 8, ED
BUN: 20 mg/dL (ref 6–20)
CREATININE: 1.2 mg/dL (ref 0.61–1.24)
Calcium, Ion: 1.08 mmol/L — ABNORMAL LOW (ref 1.15–1.40)
Chloride: 90 mmol/L — ABNORMAL LOW (ref 101–111)
GLUCOSE: 625 mg/dL — AB (ref 65–99)
HCT: 41 % (ref 39.0–52.0)
HEMOGLOBIN: 13.9 g/dL (ref 13.0–17.0)
POTASSIUM: 3.8 mmol/L (ref 3.5–5.1)
Sodium: 133 mmol/L — ABNORMAL LOW (ref 135–145)
TCO2: 34 mmol/L (ref 0–100)

## 2017-02-08 LAB — I-STAT TROPONIN, ED: Troponin i, poc: 0 ng/mL (ref 0.00–0.08)

## 2017-02-08 LAB — SALICYLATE LEVEL

## 2017-02-08 LAB — RAPID URINE DRUG SCREEN, HOSP PERFORMED
Amphetamines: NOT DETECTED
Barbiturates: NOT DETECTED
Benzodiazepines: NOT DETECTED
Cocaine: POSITIVE — AB
OPIATES: NOT DETECTED
Tetrahydrocannabinol: NOT DETECTED

## 2017-02-08 LAB — ETHANOL: Alcohol, Ethyl (B): <5 mg/dL (ref <5)

## 2017-02-08 LAB — ACETAMINOPHEN LEVEL: Acetaminophen (Tylenol), Serum: <10 ug/mL — ABNORMAL LOW (ref 10–30)

## 2017-02-08 MED ORDER — SODIUM CHLORIDE 0.9 % IV BOLUS (SEPSIS)
1000.0000 mL | Freq: Once | INTRAVENOUS | Status: AC
  Administered 2017-02-08: 1000 mL via INTRAVENOUS

## 2017-02-08 MED ORDER — PALIPERIDONE PALMITATE 156 MG/ML IM SUSP
156.0000 mg | INTRAMUSCULAR | Status: DC
  Administered 2017-02-09: 156 mg via INTRAMUSCULAR
  Filled 2017-02-08 (×3): qty 1

## 2017-02-08 MED ORDER — CLONIDINE HCL 0.1 MG PO TABS
0.1000 mg | ORAL_TABLET | Freq: Two times a day (BID) | ORAL | Status: DC
  Administered 2017-02-08 – 2017-02-10 (×4): 0.1 mg via ORAL
  Filled 2017-02-08 (×4): qty 1

## 2017-02-08 MED ORDER — INSULIN ASPART 100 UNIT/ML ~~LOC~~ SOLN
8.0000 [IU] | Freq: Once | SUBCUTANEOUS | Status: AC
Start: 2017-02-08 — End: 2017-02-08
  Administered 2017-02-08: 8 [IU] via SUBCUTANEOUS
  Filled 2017-02-08: qty 1

## 2017-02-08 MED ORDER — ACETAMINOPHEN 325 MG PO TABS
650.0000 mg | ORAL_TABLET | Freq: Four times a day (QID) | ORAL | Status: DC | PRN
  Administered 2017-02-09: 650 mg via ORAL
  Filled 2017-02-08: qty 2

## 2017-02-08 MED ORDER — HYDROXYZINE HCL 25 MG PO TABS: 25.0000 mg | ORAL_TABLET | Freq: Three times a day (TID) | ORAL | Status: DC | PRN

## 2017-02-08 MED ORDER — AMLODIPINE BESYLATE 5 MG PO TABS
5.0000 mg | ORAL_TABLET | Freq: Every day | ORAL | Status: DC
  Administered 2017-02-08 – 2017-02-10 (×3): 5 mg via ORAL
  Filled 2017-02-08 (×3): qty 1

## 2017-02-08 MED ORDER — DULOXETINE HCL 30 MG PO CPEP
30.0000 mg | ORAL_CAPSULE | Freq: Two times a day (BID) | ORAL | Status: DC
  Administered 2017-02-08 – 2017-02-10 (×4): 30 mg via ORAL
  Filled 2017-02-08 (×4): qty 1

## 2017-02-08 MED ORDER — HYDROCHLOROTHIAZIDE 25 MG PO TABS
25.0000 mg | ORAL_TABLET | Freq: Every day | ORAL | Status: DC
  Administered 2017-02-08 – 2017-02-09 (×2): 25 mg via ORAL
  Filled 2017-02-08 (×2): qty 1

## 2017-02-08 MED ORDER — METFORMIN HCL 500 MG PO TABS
500.0000 mg | ORAL_TABLET | Freq: Two times a day (BID) | ORAL | Status: DC
  Administered 2017-02-09 – 2017-02-10 (×3): 500 mg via ORAL
  Filled 2017-02-08 (×3): qty 1
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
## 2017-02-08 NOTE — ED Notes (Signed)
RN notified of critical glucose.
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
## 2017-02-08 NOTE — ED Notes (Signed)
RN AND MD NOTIFIED OF PATIENT'S GLU LEVEL OF  625

## 2017-02-08 NOTE — ED Provider Notes (Signed)
[facility] DEPT Provider Note   CSN: [id number] Arrival date & time: 02/08/17  1741     History   Chief Complaint Chief Complaint  Patient presents with  . Hallucinations  . Suicidal    HPI [name] is a 56 y.o. male.  The history is provided by the patient.  Mental Health Problem  Presenting symptoms: hallucinations and suicidal thoughts (no plan)   Patient accompanied by: EMS. Degree of incapacity (severity):  Moderate Onset quality:  Gradual Duration:  3 weeks Timing:  Constant Progression:  Worsening Chronicity:  Recurrent Context: noncompliance (States that he [facility] but has not had a shot in 2 months. Is unsure of the name of the medication.  on record review, he take Invega IM)   Treatment compliance:  Untreated Time since last psychoactive medication taken:  2 months Relieved by:  Nothing Worsened by:  Nothing Ineffective treatments:  None tried Associated symptoms: no anxiety   Risk factors: hx of mental illness    Additionally patient was noted to be hyperglycemic by EMS in route. Past Medical History:  Diagnosis Date  . Alcohol abuse   . Arthritis   . Crack cocaine use   . Depression   . Diabetes mellitus   . Drug abuse   . DTs (delirium tremens) ([place])    history of   . Gout   . Noncompliance with medication regimen   . Schizophrenia ([place])   . Stroke ([place])   . Uncontrolled hypertension     Patient Active Problem List   Diagnosis Date Noted  . Cocaine abuse with cocaine-induced mood disorder ([place]) 12/05/2016  . Cerebrovascular disease 09/02/2015  . 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 Malignant hypertension 08/30/2015  . Alcohol use disorder, severe, dependence ([place]) 08/30/2015  . Alcohol withdrawal ([place]) 08/30/2015  . 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 Thrombocytopenia ([place])  08/27/2015  . Tobacco use disorder 08/26/2015  . Hyperlipidemia 04/28/2015  . Schizoaffective disorder, depressive type ([place])   . Gout 08/09/2013  . Diabetes mellitus ([place]) 07/09/2012    Past Surgical History:  Procedure Laterality Date  . MANDIBLE RECONSTRUCTION         Home Medications    Prior to Admission medications   Medication Sig Start Date End Date Taking? Authorizing Provider  acetaminophen (TYLENOL) 325 MG tablet Take 650 mg by mouth every 6 (six) hours as needed for mild pain or headache.    Historical Provider, MD  amLODipine (NORVASC) 5 MG tablet Take 1 tablet (5 mg total) by mouth daily. 01/09/17   Patrecia Pour, NP  aspirin 325 MG tablet Take 325 mg by mouth every 6 (six) hours as needed for mild pain.    Historical Provider, MD  cloNIDine (CATAPRES) 0.1 MG tablet Take 1 tablet (0.1 mg total) by mouth 2 (two) times daily. 01/09/17   Patrecia Pour, NP  DULoxetine (CYMBALTA) 30 MG capsule Take 1 capsule (30 mg total) by mouth 2 (two) times daily. 01/09/17   Patrecia Pour, NP  hydrochlorothiazide (HYDRODIURIL) 25 MG tablet Take 1 tablet (25 mg total) by mouth daily. 01/09/17   Patrecia Pour, NP  hydrOXYzine (ATARAX/VISTARIL) 25 MG tablet Take 1 tablet (25 mg total) by mouth 3 (three) times daily as needed for anxiety. 01/09/17   Patrecia Pour, NP  ibuprofen (ADVIL,MOTRIN) 200 MG tablet Take 200 mg by mouth every 6 (six) hours as  needed for moderate pain.    Historical Provider, MD  meloxicam (MOBIC) 7.5 MG tablet Take 7.5 mg by mouth 2 (two) times daily as needed for pain.    Historical Provider, MD  metFORMIN (GLUCOPHAGE) 500 MG tablet Take 1 tablet (500 mg total) by mouth 2 (two) times daily with a meal. 01/09/17   Patrecia Pour, NP  paliperidone (INVEGA SUSTENNA) 156 MG/ML SUSP injection Inject 156 mg into the muscle every 30 (thirty) days.    Historical Provider, MD  tiZANidine (ZANAFLEX) 2 MG tablet Take 2-4 mg by mouth every 8 (eight) hours as needed for muscle  spasms.    Historical Provider, MD    Family History Family History  Problem Relation Age of Onset  . Hypertension      Social History Social History  Substance Use Topics  . Smoking status: Current Every Day Smoker    Packs/day: 0.25    Types: Cigarettes  . Smokeless tobacco: Never Used  . Alcohol use 0.6 - 1.2 oz/week    1 - 2 Cans of beer per week     Comment: [facility]     Allergies   Patient has no known allergies.   Review of Systems Review of Systems  Psychiatric/Behavioral: Positive for hallucinations and suicidal ideas (no plan). The patient is not nervous/anxious.   Ten systems are reviewed and are negative for acute change except as noted in the HPI    Physical Exam Updated Vital Signs BP (!) 176/94 (BP Location: Left Arm)   Pulse 89   Temp 97.4 F (36.3 C) (Oral)   Resp (!) 24   SpO2 99%   Physical Exam  Constitutional: He is oriented to person, place, and time. He appears well-developed and well-nourished. No distress.  HENT:  Head: Normocephalic and atraumatic.  Nose: Nose normal.  Eyes: Conjunctivae and EOM are normal. Pupils are equal, round, and reactive to light. Right eye exhibits no discharge. Left eye exhibits no discharge. No scleral icterus.  Neck: Normal range of motion. Neck supple.  Cardiovascular: Normal rate and regular rhythm.  Exam reveals no gallop and no friction rub.   No murmur heard. Pulmonary/Chest: Effort normal and breath sounds normal. No stridor. No respiratory distress. He has no rales.  Abdominal: Soft. He exhibits no distension. There is no tenderness.  Musculoskeletal: He exhibits no edema or tenderness.  Neurological: He is alert and oriented to person, place, and time.  Skin: Skin is warm and dry. No rash noted. He is not diaphoretic. No erythema.  Psychiatric: He has a normal mood and affect. His speech is normal and behavior is normal. He is actively hallucinating. He expresses suicidal ideation. He expresses  no suicidal plans.  Vitals reviewed.    ED Treatments / Results  Labs (all labs ordered are listed, but only abnormal results are displayed) Labs Reviewed  CBC - Abnormal; Notable for the following:       Result Value   Platelets 137 (*)    All other components within normal limits  CBG MONITORING, ED - Abnormal; Notable for the following:    Glucose-Capillary >600 (*)    All other components within normal limits  COMPREHENSIVE METABOLIC PANEL  ETHANOL  SALICYLATE LEVEL  ACETAMINOPHEN LEVEL  RAPID URINE DRUG SCREEN, HOSP PERFORMED  URINALYSIS, ROUTINE W REFLEX MICROSCOPIC  I-STAT TROPOININ, ED    EKG  EKG Interpretation None       Radiology Dg Chest 2 View  Result Date: 02/08/2017 CLINICAL DATA:  Shortness of breath, cough. EXAM: CHEST  2 VIEW COMPARISON:  Radiograph of November 01, 2016. FINDINGS: The heart size and mediastinal contours are within normal limits. Both lungs are clear. No pneumothorax or pleural effusion is noted. The visualized skeletal structures are unremarkable. IMPRESSION: No active cardiopulmonary disease. Electronically Signed   By: Marijo Conception, M.D.   On: 02/08/2017 18:34    Procedures Procedures (including critical care time)  Medications Ordered in ED Medications  sodium chloride 0.9 % bolus 1,000 mL (not administered)     Initial Impression / Assessment and Plan / ED Course  I have reviewed the triage vital signs and the nursing notes.  Pertinent labs & imaging results that were available during my care of the patient were reviewed by me and considered in my medical decision making (see chart for details).     1. Hyperglycemia No evidence of DKA on labs. Patient given IV fluids and subcutaneous insulin. Place on his home metformin as well. Will continue to hydrate.  2. Psych eval History schizophrenia who is noncompliant with his biggest shots. SI without active plan. Has had increased auditory hallucinations telling him to  "kill kill, kill." [facility] consulted.  Final Clinical Impressions(s) / ED Diagnoses   Final diagnoses:  Hallucinations  Suicidal ideations  Hyperglycemia      Fatima Blank, MD 02/09/17 [phone]

## 2017-02-08 NOTE — ED Notes (Signed)
Post triage pt verbalizes constant central chest pain for 5 days; pt denies pain initially.

## 2017-02-09 DIAGNOSIS — F251 Schizoaffective disorder, depressive type: Secondary | ICD-10-CM | POA: Diagnosis not present

## 2017-02-09 DIAGNOSIS — F1414 Cocaine abuse with cocaine-induced mood disorder: Secondary | ICD-10-CM

## 2017-02-09 DIAGNOSIS — Z79899 Other long term (current) drug therapy: Secondary | ICD-10-CM

## 2017-02-09 DIAGNOSIS — F17210 Nicotine dependence, cigarettes, uncomplicated: Secondary | ICD-10-CM

## 2017-02-09 LAB — CBG MONITORING, ED
GLUCOSE-CAPILLARY: 122 mg/dL — AB (ref 65–99)
GLUCOSE-CAPILLARY: 175 mg/dL — AB (ref 65–99)
GLUCOSE-CAPILLARY: 228 mg/dL — AB (ref 65–99)
GLUCOSE-CAPILLARY: 296 mg/dL — AB (ref 65–99)
GLUCOSE-CAPILLARY: 307 mg/dL — AB (ref 65–99)
Glucose-Capillary: 271 mg/dL — ABNORMAL HIGH (ref 65–99)
Glucose-Capillary: 175 mg/dL — ABNORMAL HIGH (ref 65–99)
Glucose-Capillary: 269 mg/dL — ABNORMAL HIGH (ref 65–99)

## 2017-02-09 MED ORDER — INSULIN ASPART 100 UNIT/ML ~~LOC~~ SOLN
0.0000 [IU] | SUBCUTANEOUS | Status: DC
  Administered 2017-02-09: 8 [IU] via SUBCUTANEOUS
  Administered 2017-02-09 (×2): 3 [IU] via SUBCUTANEOUS
  Administered 2017-02-09: 8 [IU] via SUBCUTANEOUS
  Administered 2017-02-09: 5 [IU] via SUBCUTANEOUS
  Administered 2017-02-09: 2 [IU] via SUBCUTANEOUS
  Administered 2017-02-09: 11 [IU] via SUBCUTANEOUS
  Administered 2017-02-10 (×2): 3 [IU] via SUBCUTANEOUS
  Filled 2017-02-09 (×9): qty 1

## 2017-02-09 MED ORDER — CLONIDINE HCL 0.1 MG PO TABS
0.1000 mg | ORAL_TABLET | Freq: Once | ORAL | Status: AC
  Administered 2017-02-09: 0.1 mg via ORAL
  Filled 2017-02-09: qty 1

## 2017-02-09 MED ORDER — HYDROCHLOROTHIAZIDE 25 MG PO TABS
25.0000 mg | ORAL_TABLET | Freq: Every day | ORAL | Status: DC
  Administered 2017-02-10: 25 mg via ORAL
  Filled 2017-02-09: qty 1

## 2017-02-09 MED ORDER — INSULIN ASPART 100 UNIT/ML ~~LOC~~ SOLN
4.0000 [IU] | Freq: Once | SUBCUTANEOUS | Status: AC
  Administered 2017-02-09: 4 [IU] via SUBCUTANEOUS
  Filled 2017-02-09: qty 1

## 2017-02-09 NOTE — ED Notes (Signed)
Pt admitted to room #41. Pt behavior cooperative. Pt denies SI/HI. Endorsing AH, pt reports he feels better now that he received his "shot".  Encouragement and support provided. Special checks q 15 mins in place for safety. Video monitoring in place. Will continue to monitor.

## 2017-02-09 NOTE — ED Notes (Signed)
Bed: LI10 Expected date:  Expected time:  Means of arrival:  Comments: Room 4

## 2017-02-09 NOTE — ED Notes (Signed)
No respiratory or acute distress noted resting in bed with eyes closed call light in reach hospital sitter with pt no reaction to medication noted.

## 2017-02-09 NOTE — ED Notes (Signed)
This nurse notified EDP of pt bp.  

## 2017-02-09 NOTE — ED Notes (Signed)
SBAR Report received from previous nurse. Pt received calm and visible on unit. Pt denies current SI/ HI, A/V H, depression, anxiety, or pain at this time, and appears otherwise stable and free of distress. Pt reminded of camera surveillance, q 15 min rounds, and rules of the milieu.  Pt asked writer if his blood sugars were not controled if he would get to stay. Will continue to assess.

## 2017-02-09 NOTE — ED Notes (Signed)
No respiratory or acute distress noted resting in bed with eyes closed call light in reach hospital sitter at bedside.

## 2017-02-09 NOTE — Consult Note (Signed)
[facility] Consult   Reason for Consult:  Cocaine abuse with suicidal ideations Referring Physician:  EDP Patient Identification: Keldon Lassen MRN:  [id number] Principal Diagnosis: Schizoaffective disorder, depressive type [facility]) Diagnosis:   Patient Active Problem List   Diagnosis Date Noted  . Cocaine abuse with cocaine-induced mood disorder [facility]) [F14.14] 12/05/2016    Priority: High  . Schizoaffective disorder, depressive type ([place]) [F25.1]     Priority: High  . Alcohol use disorder, severe, dependence ([place]) [F10.20] 08/30/2015    Priority: Low  . Cerebrovascular disease [I67.9] 09/02/2015  . 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 Malignant hypertension [I10] 08/30/2015  . Alcohol withdrawal ([place]) [F10.239] 08/30/2015  . 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 Thrombocytopenia ([place]) [D69.6] 08/27/2015  . Tobacco use disorder [F17.200] 08/26/2015  . Hyperlipidemia [E78.5] 04/28/2015  . Gout [M10.9] 08/09/2013  . Diabetes mellitus ([place]) [E11.9] 07/09/2012    Total Time spent with patient: 45 minutes  Subjective:   Keiran Gaffey is a 56 y.o. male patient will be discharged in the am after medications restarted along with his injectable antipsychotic.    HPI:  56 yo male who presented to the ED with suicidal ideations after using cocaine.  His big stressor is not having a place to live, awaiting housing.  Today, he reports he does not have his medications nor did he receive his injectable antipsychotic.  Medications will be restarted and discharged tomorrow.  He is well known to this ED and providers for similar issues.  No suicidal/homicidal ideations, hallucinations, or withdrawal symptoms.    Past Psychiatric History: schizoaffective disorder  Risk to Self: None Risk to Others: Homicidal  Ideation: No Thoughts of Harm to Others: No Current Homicidal Intent: No Current Homicidal Plan: No Access to Homicidal Means: No History of harm to others?: No Assessment of Violence: None Noted Does patient have access to weapons?: No Criminal Charges Pending?: No Does patient have a court date: No Prior Inpatient Therapy: Prior Inpatient Therapy: Yes Prior Therapy Dates: 2007, 2015, 2016, Prior Therapy Facilty/Provider(s): [place], [facility] [place], [facility] Reason for Treatment: Schizoaffective d/o, MDD Prior Outpatient Therapy: Prior Outpatient Therapy: Yes Prior Therapy Dates: current Prior Therapy Facilty/Provider(s): [name] Reason for Treatment: [name] Does patient have an ACCT team?: Yes Does patient have Intensive In-House Services?  : No Does patient have [name] services? : Yes Does patient have P4CC services?: No  Past Medical History:  Past Medical History:  Diagnosis Date  . Alcohol abuse   . Arthritis   . Crack cocaine use   . Depression   . Diabetes mellitus   . Drug abuse   . DTs (delirium tremens) ([place])    history of   . Gout   . Noncompliance with medication regimen   . Schizophrenia ([place])   . Stroke ([place])   . Uncontrolled hypertension     Past Surgical History:  Procedure Laterality Date  . MANDIBLE RECONSTRUCTION     Family History:  Family History  Problem Relation Age of Onset  . Hypertension     Family Psychiatric  History: unknown Social History:  History  Alcohol Use  . 0.6 - 1.2 oz/week  . 1 - 2 Cans of beer per week    Comment: [facility]     History  Drug Use  . Types: "Crack" cocaine, Cocaine    Social History   Social History  .  Marital status: Legally Separated    Spouse name: N/A  . Number of children: N/A  . Years of education: N/A   Social History Main Topics  . Smoking status: Current Every Day Smoker    Packs/day: 0.25    Types: Cigarettes  . Smokeless tobacco: Never Used  . Alcohol use 0.6 - 1.2  oz/week    1 - 2 Cans of beer per week     Comment: [facility]  . Drug use: Yes    Types: "Crack" cocaine, Cocaine  . Sexual activity: Not Asked   Other Topics Concern  . None   Social History Narrative  . None   Additional Social History:    Allergies:  No Known Allergies  Labs:  Results for orders placed or performed during the hospital encounter of 02/08/17 (from the past 48 hour(s))  Comprehensive metabolic panel     Status: Abnormal   Collection Time: 02/08/17  6:22 PM  Result Value Ref Range   Sodium 133 (L) 135 - 145 mmol/L   Potassium 3.8 3.5 - 5.1 mmol/L   Chloride 91 (L) 101 - 111 mmol/L   CO2 33 (H) 22 - 32 mmol/L   Glucose, Bld 663 (HH) 65 - 99 mg/dL    Comment: CRITICAL RESULT CALLED TO, READ BACK BY AND VERIFIED WITH: E BEAULAURIER RN @ 1909 ON 02/08/17 BY C DAVIS    BUN 18 6 - 20 mg/dL   Creatinine, Ser 1.28 (H) 0.61 - 1.24 mg/dL   Calcium 9.3 8.9 - 10.3 mg/dL   Total Protein 7.4 6.5 - 8.1 g/dL   Albumin 3.6 3.5 - 5.0 g/dL   AST 147 (H) 15 - 41 U/L   ALT 68 (H) 17 - 63 U/L   Alkaline Phosphatase 72 38 - 126 U/L   Total Bilirubin 1.1 0.3 - 1.2 mg/dL   GFR calc non Af Amer >60 >60 mL/min   GFR calc Af Amer >60 >60 mL/min    Comment: (NOTE) The eGFR has been calculated using the CKD EPI equation. This calculation has not been validated in all clinical situations. eGFR's persistently <60 mL/min signify possible Chronic Kidney Disease.    Anion gap 9 5 - 15  Ethanol     Status: None   Collection Time: 02/08/17  6:22 PM  Result Value Ref Range   Alcohol, Ethyl (B) <5 <5 mg/dL    Comment:        LOWEST DETECTABLE LIMIT FOR SERUM ALCOHOL IS 5 mg/dL FOR MEDICAL PURPOSES ONLY   Salicylate level     Status: None   Collection Time: 02/08/17  6:22 PM  Result Value Ref Range   Salicylate Lvl <6.3 2.8 - 30.0 mg/dL  Acetaminophen level     Status: Abnormal   Collection Time: 02/08/17  6:22 PM  Result Value Ref Range   Acetaminophen (Tylenol), Serum  <10 (L) 10 - 30 ug/mL    Comment:        THERAPEUTIC CONCENTRATIONS VARY SIGNIFICANTLY. A RANGE OF 10-30 ug/mL MAY BE AN EFFECTIVE CONCENTRATION FOR MANY PATIENTS. HOWEVER, SOME ARE BEST TREATED AT CONCENTRATIONS OUTSIDE THIS RANGE. ACETAMINOPHEN CONCENTRATIONS >150 ug/mL AT 4 HOURS AFTER INGESTION AND >50 ug/mL AT 12 HOURS AFTER INGESTION ARE OFTEN ASSOCIATED WITH TOXIC REACTIONS.   cbc     Status: Abnormal   Collection Time: 02/08/17  6:22 PM  Result Value Ref Range   WBC 6.3 4.0 - 10.5 K/uL   RBC 4.58 4.22 - 5.81 MIL/uL  Hemoglobin 13.4 13.0 - 17.0 g/dL   HCT 40.2 39.0 - 52.0 %   MCV 87.8 78.0 - 100.0 fL   MCH 29.3 26.0 - 34.0 pg   MCHC 33.3 30.0 - 36.0 g/dL   RDW 12.6 11.5 - 15.5 %   Platelets 137 (L) 150 - 400 K/uL  I-stat troponin, ED     Status: None   Collection Time: 02/08/17  6:31 PM  Result Value Ref Range   Troponin i, poc 0.00 0.00 - 0.08 ng/mL   Comment 3            Comment: Due to the release kinetics of cTnI, a negative result within the first hours of the onset of symptoms does not rule out myocardial infarction with certainty. If myocardial infarction is still suspected, repeat the test at appropriate intervals.   CBG monitoring, ED     Status: Abnormal   Collection Time: 02/08/17  6:35 PM  Result Value Ref Range   Glucose-Capillary >600 (HH) 65 - 99 mg/dL  Rapid urine drug screen (hospital performed)     Status: Abnormal   Collection Time: 02/08/17  6:54 PM  Result Value Ref Range   Opiates NONE DETECTED NONE DETECTED   Cocaine POSITIVE (A) NONE DETECTED   Benzodiazepines NONE DETECTED NONE DETECTED   Amphetamines NONE DETECTED NONE DETECTED   Tetrahydrocannabinol NONE DETECTED NONE DETECTED   Barbiturates NONE DETECTED NONE DETECTED    Comment:        DRUG SCREEN FOR MEDICAL PURPOSES ONLY.  IF CONFIRMATION IS NEEDED FOR ANY PURPOSE, NOTIFY LAB WITHIN 5 DAYS.        LOWEST DETECTABLE LIMITS FOR URINE DRUG SCREEN Drug Class       Cutoff  (ng/mL) Amphetamine      1000 Barbiturate      200 Benzodiazepine   824 Tricyclics       235 Opiates          300 Cocaine          300 THC              50   Urinalysis, Routine w reflex microscopic     Status: Abnormal   Collection Time: 02/08/17  6:54 PM  Result Value Ref Range   Color, Urine YELLOW YELLOW   APPearance CLEAR CLEAR   Specific Gravity, Urine 1.026 1.005 - 1.030   pH 6.0 5.0 - 8.0   Glucose, UA >=500 (A) NEGATIVE mg/dL   Hgb urine dipstick SMALL (A) NEGATIVE   Bilirubin Urine NEGATIVE NEGATIVE   Ketones, ur NEGATIVE NEGATIVE mg/dL   Protein, ur 30 (A) NEGATIVE mg/dL   Nitrite NEGATIVE NEGATIVE   Leukocytes, UA NEGATIVE NEGATIVE   RBC / HPF 0-5 0 - 5 RBC/hpf   WBC, UA 0-5 0 - 5 WBC/hpf   Bacteria, UA RARE (A) NONE SEEN   Squamous Epithelial / LPF NONE SEEN NONE SEEN  Blood gas, venous     Status: Abnormal   Collection Time: 02/08/17  7:15 PM  Result Value Ref Range   FIO2 0.21    Delivery systems ROOM AIR    pH, Ven 7.371 7.250 - 7.430   pCO2, Ven 59.4 44.0 - 60.0 mmHg   pO2, Ven  BELOW REPORTABLE RANGE. 32.0 - 45.0 mmHg    Comment: CRITICAL RESULT CALLED TO, READ BACK BY AND VERIFIED WITH:  Lynwood Dawley, RN AT 1925 BY JESSICA NEUGENT,RRT,RCP ON 02/08/17    Bicarbonate 33.6 (H) 20.0 - 28.0 mmol/L  Acid-Base Excess 6.9 (H) 0.0 - 2.0 mmol/L   O2 Saturation 49.4 %   Patient temperature 98.6    Collection site VEIN    Drawn by COLLECTED BY NURSE    Sample type VENOUS   I-Stat Chem 8, ED     Status: Abnormal   Collection Time: 02/08/17  7:30 PM  Result Value Ref Range   Sodium 133 (L) 135 - 145 mmol/L   Potassium 3.8 3.5 - 5.1 mmol/L   Chloride 90 (L) 101 - 111 mmol/L   BUN 20 6 - 20 mg/dL   Creatinine, Ser 1.20 0.61 - 1.24 mg/dL   Glucose, Bld 625 (HH) 65 - 99 mg/dL   Calcium, Ion 1.08 (L) 1.15 - 1.40 mmol/L   TCO2 34 0 - 100 mmol/L   Hemoglobin 13.9 13.0 - 17.0 g/dL   HCT 41.0 39.0 - 52.0 %   Comment NOTIFIED PHYSICIAN   POC CBG, ED     Status:  Abnormal   Collection Time: 02/08/17  9:35 PM  Result Value Ref Range   Glucose-Capillary 475 (H) 65 - 99 mg/dL  POC CBG, ED     Status: Abnormal   Collection Time: 02/08/17 11:16 PM  Result Value Ref Range   Glucose-Capillary 455 (H) 65 - 99 mg/dL  CBG monitoring, ED     Status: Abnormal   Collection Time: 02/09/17 12:52 AM  Result Value Ref Range   Glucose-Capillary 307 (H) 65 - 99 mg/dL  CBG monitoring, ED     Status: Abnormal   Collection Time: 02/09/17  1:44 AM  Result Value Ref Range   Glucose-Capillary 296 (H) 65 - 99 mg/dL  CBG monitoring, ED     Status: Abnormal   Collection Time: 02/09/17  3:56 AM  Result Value Ref Range   Glucose-Capillary 122 (H) 65 - 99 mg/dL  CBG monitoring, ED     Status: Abnormal   Collection Time: 02/09/17  8:44 AM  Result Value Ref Range   Glucose-Capillary 175 (H) 65 - 99 mg/dL  CBG monitoring, ED     Status: Abnormal   Collection Time: 02/09/17 11:55 AM  Result Value Ref Range   Glucose-Capillary 271 (H) 65 - 99 mg/dL    Current Facility-Administered Medications  Medication Dose Route Frequency Provider Last Rate Last Dose  . acetaminophen (TYLENOL) tablet 650 mg  650 mg Oral Q6H PRN Fatima Blank, MD   650 mg at 02/09/17 0853  . amLODipine (NORVASC) tablet 5 mg  5 mg Oral Daily Fatima Blank, MD   5 mg at 02/09/17 1005  . cloNIDine (CATAPRES) tablet 0.1 mg  0.1 mg Oral BID Fatima Blank, MD   0.1 mg at 02/09/17 1005  . DULoxetine (CYMBALTA) DR capsule 30 mg  30 mg Oral BID Fatima Blank, MD   30 mg at 02/09/17 1005  . hydrochlorothiazide (HYDRODIURIL) tablet 25 mg  25 mg Oral Daily Fatima Blank, MD   25 mg at 02/09/17 1005  . hydrOXYzine (ATARAX/VISTARIL) tablet 25 mg  25 mg Oral TID PRN Fatima Blank, MD      . insulin aspart (novoLOG) injection 0-15 Units  0-15 Units Subcutaneous Q4H Jola Schmidt, MD   8 Units at 02/09/17 1257  . metFORMIN (GLUCOPHAGE) tablet 500 mg  500 mg Oral BID WC  Fatima Blank, MD   500 mg at 02/09/17 0853  . paliperidone (INVEGA SUSTENNA) injection 156 mg  156 mg Intramuscular Q30 days Fatima Blank, MD  156 mg at 02/09/17 1056   Current Outpatient Prescriptions  Medication Sig Dispense Refill  . acetaminophen (TYLENOL) 325 MG tablet Take 650 mg by mouth every 6 (six) hours as needed for mild pain or headache.    Marland Kitchen amLODipine (NORVASC) 5 MG tablet Take 1 tablet (5 mg total) by mouth daily. 30 tablet 0  . cloNIDine (CATAPRES) 0.1 MG tablet Take 1 tablet (0.1 mg total) by mouth 2 (two) times daily. 60 tablet 0  . DULoxetine (CYMBALTA) 30 MG capsule Take 1 capsule (30 mg total) by mouth 2 (two) times daily. 60 capsule 0  . hydrochlorothiazide (HYDRODIURIL) 25 MG tablet Take 1 tablet (25 mg total) by mouth daily. 30 tablet 0  . hydrOXYzine (ATARAX/VISTARIL) 25 MG tablet Take 1 tablet (25 mg total) by mouth 3 (three) times daily as needed for anxiety. 30 tablet 0  . metFORMIN (GLUCOPHAGE) 500 MG tablet Take 1 tablet (500 mg total) by mouth 2 (two) times daily with a meal. 60 tablet 0  . paliperidone (INVEGA SUSTENNA) 156 MG/ML SUSP injection Inject 156 mg into the muscle every 30 (thirty) days.      Musculoskeletal: Strength & Muscle Tone: within normal limits Gait & Station: normal Patient leans: N/A  Psychiatric Specialty Exam: Physical Exam  Constitutional: He is oriented to person, place, and time. He appears well-developed and well-nourished.  HENT:  Head: Normocephalic.  Neck: Normal range of motion.  Respiratory: Effort normal.  Musculoskeletal: Normal range of motion.  Neurological: He is alert and oriented to person, place, and time.  Psychiatric: His speech is normal and behavior is normal. Judgment and thought content normal. Cognition and memory are normal. He exhibits a depressed mood.    Review of Systems  Psychiatric/Behavioral: Positive for depression and substance abuse.  All other systems reviewed and are  negative.   Blood pressure (!) 150/100, pulse 85, temperature 98.5 F (36.9 C), temperature source Oral, resp. rate 17, SpO2 100 %.There is no height or weight on file to calculate BMI.  General Appearance: Casual  Eye Contact:  Fair  Speech:  Normal Rate  Volume:  Normal  Mood:  Depressed  Affect:  Congruent  Thought Process:  Coherent and Descriptions of Associations: Intact  Orientation:  Full (Time, Place, and Person)  Thought Content:  WDL and Logical  Suicidal Thoughts:  No  Homicidal Thoughts:  No  Memory:  Immediate;   Fair Recent;   Fair Remote;   Fair  Judgement:  Fair  Insight:  Fair  Psychomotor Activity:  Normal  Concentration:  Concentration: Good and Attention Span: Good  Recall:  Good  Fund of Knowledge:  Fair  Language:  Good  Akathisia:  No  Handed:  Right  AIMS (if indicated):     Assets:  Leisure Time Physical Health Resilience Social Support  ADL's:  Intact  Cognition:  WNL  Sleep:        Treatment Plan Summary: Daily contact with patient to assess and evaluate symptoms and progress in treatment, Medication management and Plan schizoaffective disorder, depressive type:  -Crisis stabilization -Medication management:  Restarted medical medications along with Cymbalta 30 mg BID for depression, Invega 156 mg today (EDP), and Vistaril 25 mg TID PRN anxiety started -Individual counseling  Disposition: Supportive therapy provided about ongoing stressors.  Waylan Boga, NP 02/09/2017 2:08 PM  Patient seen face-to-face for psychiatric evaluation, chart reviewed and case discussed with the physician extender and developed treatment plan. Reviewed the information documented and agree with the treatment  plan. Corena Pilgrim, MD

## 2017-02-10 ENCOUNTER — Encounter (HOSPITAL_COMMUNITY): Payer: Self-pay

## 2017-02-10 ENCOUNTER — Emergency Department (HOSPITAL_COMMUNITY): Payer: Medicaid Other

## 2017-02-10 LAB — CBG MONITORING, ED
GLUCOSE-CAPILLARY: 187 mg/dL — AB (ref 65–99)
Glucose-Capillary: 173 mg/dL — ABNORMAL HIGH (ref 65–99)

## 2017-02-10 LAB — BASIC METABOLIC PANEL
ANION GAP: 13 (ref 5–15)
BUN: 11 mg/dL (ref 6–20)
CHLORIDE: 93 mmol/L — AB (ref 101–111)
CO2: 25 mmol/L (ref 22–32)
Calcium: 9.2 mg/dL (ref 8.9–10.3)
Creatinine, Ser: 0.9 mg/dL (ref 0.61–1.24)
GFR calc non Af Amer: >60 mL/min (ref >60)
GLUCOSE: 423 mg/dL — AB (ref 65–99)
POTASSIUM: 3.4 mmol/L — AB (ref 3.5–5.1)
Sodium: 131 mmol/L — ABNORMAL LOW (ref 135–145)

## 2017-02-10 LAB — CBC
HEMATOCRIT: 38.4 % — AB (ref 39.0–52.0)
HEMOGLOBIN: 12.8 g/dL — AB (ref 13.0–17.0)
MCH: 29.6 pg (ref 26.0–34.0)
MCHC: 33.3 g/dL (ref 30.0–36.0)
MCV: 88.9 fL (ref 78.0–100.0)
Platelets: 128 10*3/uL — ABNORMAL LOW (ref 150–400)
RBC: 4.32 MIL/uL (ref 4.22–5.81)
RDW: 12.1 % (ref 11.5–15.5)
WBC: 6.2 10*3/uL (ref 4.0–10.5)

## 2017-02-10 LAB — I-STAT TROPONIN, ED: TROPONIN I, POC: 0.02 ng/mL (ref 0.00–0.08)

## 2017-02-10 NOTE — ED Notes (Signed)
Writer made aware that pt bp elevated/ manuel BP assessed by writer and found in normal range. Pt no c/o symptomology. Will report to oncoming nurse.

## 2017-02-10 NOTE — BH Assessment (Signed)
[facility] Assessment Progress Note  Per Corena Pilgrim, MD, this pt does not require psychiatric hospitalization at this time.  Pt is to be discharged from [facility] with recommendation to follow up with [facility].  He is also to be provided with information for supportive services for the homeless.  These have been included in pt's discharge instructions.  [name] nurse, Nena Jordan, has been notified.  Jalene Mullet, [place] Triage Specialist [phone]

## 2017-02-10 NOTE — ED Notes (Signed)
Patient discharged per MD order.  Patient will follow up with [name].  He denies any thoughts of self harm.  Patient denies HI/AVH.  Patient left ambulatory with a bus pass.

## 2017-02-10 NOTE — [facility] Suicide Risk Assessment (Cosign Needed)
Suicide Risk Assessment  Discharge Assessment   [facility] Discharge Suicide Risk Assessment   Principal Problem: Schizoaffective disorder, depressive type [facility]) Discharge Diagnoses:  Patient Active Problem List   Diagnosis Date Noted  . Cocaine abuse with cocaine-induced mood disorder ([place]) [F14.14] 12/05/2016  . Cerebrovascular disease [I67.9] 09/02/2015  . 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 Malignant hypertension [I10] 08/30/2015  . Alcohol use disorder, severe, dependence ([place]) [F10.20] 08/30/2015  . Alcohol withdrawal ([place]) [F10.239] 08/30/2015  . 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 Thrombocytopenia ([place]) [D69.6] 08/27/2015  . Tobacco use disorder [F17.200] 08/26/2015  . Hyperlipidemia [E78.5] 04/28/2015  . Schizoaffective disorder, depressive type ([place]) [F25.1]   . Gout [M10.9] 08/09/2013  . Diabetes mellitus ([place]) [E11.9] 07/09/2012    Total Time spent with patient: 30 minutes  Musculoskeletal: Strength & Muscle Tone: within normal limits Gait & Station: normal Patient leans: N/A  Psychiatric Specialty Exam:   Blood pressure 138/84, pulse 84, temperature 98.5 F (36.9 C), temperature source Oral, resp. rate 20, SpO2 100 %.There is no height or weight on file to calculate BMI.  General Appearance: Casual  Eye Contact::  Fair  Speech:  Clear and Coherent409  Volume:  Normal  Mood:  Depressed  Affect:  Congruent  Thought Process:  Coherent  Orientation:  Full (Time, Place, and Person)  Thought Content:  Hallucinations: None  Suicidal Thoughts:  No  Homicidal Thoughts:  No  Memory:  Immediate;   Good Recent;   Fair Remote;   Good  Judgement:  Fair  Insight:  Present  Psychomotor Activity:  Normal  Concentration:  Fair  Recall:  [employer] of Knowledge:Fair  Language: Fair  Akathisia:  No  Handed:  Right   AIMS (if indicated):     Assets:  Communication Skills Desire for Improvement Financial Resources/Insurance Transportation  Sleep:     Cognition: WNL  ADL's:  Intact     I agree with current treatment plan on 02/10/2017, Patient seen face-to-face for psychiatric evaluation follow-up, chart reviewed and case discussed with the MD [place] and Treatment team. Reviewed the information documented and agree with the treatment plan and disposition. Patient was provided with additional outpatient resources.   Mental Status Per Nursing Assessment::   On Admission:     Demographic Factors:  Male and Unemployed  Loss Factors: Financial problems/change in socioeconomic status  Historical Factors: Family history of mental illness or substance abuse  Risk Reduction Factors:   Positive coping skills or problem solving skills  Continued Clinical Symptoms:  Depression:   Comorbid alcohol abuse/dependence  Cognitive Features That Contribute To Risk:  Thought constriction (tunnel vision)    Suicide Risk:  Mild:  Suicidal ideation of limited frequency, intensity, duration, and specificity.  There are no identifiable plans, no associated intent, mild dysphoria and related symptoms, good self-control (both objective and subjective assessment), few other risk factors, and identifiable protective factors, including available and accessible social support.    Plan Of Care/Follow-up recommendations:  Activity:  as tolerated  Diet:  heart healthy  Derrill [place], NP 02/10/2017, 11:26 AM

## 2017-02-10 NOTE — ED Triage Notes (Signed)
Pt arrives EMS with reports of chest pain since 1200 today when dc from [facility]. PT states left sided chest pain with no radiation. ETOH on board

## 2017-02-10 NOTE — Discharge Instructions (Signed)
For your ongoing mental health needs, you are advised to follow up with [name].  New and returning patients are seen at their walk-in clinic.  Walk-in hours are Monday - Friday from 8:00 am - 3:00 pm.  Walk-in patients are seen on a first come, first served basis.  Try to arrive as early as possible for he best chance of being seen the same day:       [name]      [address], [place] [address]      [phone]  For your shelter needs, contact the following service providers:       [facility] (operated by [facility])      [place], [place] [address]      [phone]       [facility]      [address]      [place], [place] [address]      [phone]  For day shelter and other supportive services for the homeless, contact the [facility]):       [facility]      [place], [place] [address]      [phone]  For transitional housing, contact one of the following agencies.  They provide longer term housing than a shelter, but there is an application process:       [facility]      [address]      [place], [place] [address]      [phone]       [facility]. [address], [place] [address]      [phone]

## 2017-02-11 ENCOUNTER — Emergency Department (EMERGENCY_DEPARTMENT_HOSPITAL)
Admission: EM | Admit: 2017-02-11 | Discharge: 2017-02-11 | Disposition: A | Discharge status: Home / Self Care | Payer: Medicaid Other | Source: Home / Self Care | Attending: Emergency Medicine | Admitting: Emergency Medicine

## 2017-02-11 DIAGNOSIS — R0789 Other chest pain: Secondary | ICD-10-CM

## 2017-02-11 DIAGNOSIS — F10129 Alcohol abuse with intoxication, unspecified: Secondary | ICD-10-CM

## 2017-02-11 DIAGNOSIS — F1414 Cocaine abuse with cocaine-induced mood disorder: Secondary | ICD-10-CM | POA: Diagnosis present

## 2017-02-11 DIAGNOSIS — Z79899 Other long term (current) drug therapy: Secondary | ICD-10-CM | POA: Diagnosis not present

## 2017-02-11 DIAGNOSIS — R739 Hyperglycemia, unspecified: Secondary | ICD-10-CM

## 2017-02-11 DIAGNOSIS — R45851 Suicidal ideations: Secondary | ICD-10-CM

## 2017-02-11 DIAGNOSIS — F251 Schizoaffective disorder, depressive type: Secondary | ICD-10-CM | POA: Diagnosis present

## 2017-02-11 DIAGNOSIS — F17210 Nicotine dependence, cigarettes, uncomplicated: Secondary | ICD-10-CM

## 2017-02-11 DIAGNOSIS — F1014 Alcohol abuse with alcohol-induced mood disorder: Secondary | ICD-10-CM | POA: Diagnosis present

## 2017-02-11 DIAGNOSIS — F3289 Other specified depressive episodes: Secondary | ICD-10-CM | POA: Diagnosis not present

## 2017-02-11 DIAGNOSIS — F1910 Other psychoactive substance abuse, uncomplicated: Secondary | ICD-10-CM

## 2017-02-11 LAB — HEPATIC FUNCTION PANEL
ALT: 61 U/L (ref 17–63)
AST: 156 U/L — AB (ref 15–41)
Albumin: 3.4 g/dL — ABNORMAL LOW (ref 3.5–5.0)
Alkaline Phosphatase: 63 U/L (ref 38–126)
BILIRUBIN DIRECT: 0.2 mg/dL (ref 0.1–0.5)
BILIRUBIN INDIRECT: 0.4 mg/dL (ref 0.3–0.9)
TOTAL PROTEIN: 6.4 g/dL — AB (ref 6.5–8.1)
Total Bilirubin: 0.6 mg/dL (ref 0.3–1.2)

## 2017-02-11 LAB — URINALYSIS, ROUTINE W REFLEX MICROSCOPIC
BACTERIA UA: NONE SEEN
BILIRUBIN URINE: NEGATIVE
Ketones, ur: NEGATIVE mg/dL
LEUKOCYTES UA: NEGATIVE
NITRITE: NEGATIVE
Protein, ur: NEGATIVE mg/dL
SPECIFIC GRAVITY, URINE: 1.022 (ref 1.005–1.030)
Squamous Epithelial / LPF: NONE SEEN
WBC, UA: NONE SEEN WBC/hpf (ref 0–5)
pH: 5 (ref 5.0–8.0)

## 2017-02-11 LAB — RAPID URINE DRUG SCREEN, HOSP PERFORMED
Amphetamines: NOT DETECTED
BENZODIAZEPINES: NOT DETECTED
Barbiturates: NOT DETECTED
COCAINE: POSITIVE — AB
Opiates: NOT DETECTED
Tetrahydrocannabinol: NOT DETECTED

## 2017-02-11 LAB — CBG MONITORING, ED
GLUCOSE-CAPILLARY: 257 mg/dL — AB (ref 65–99)
Glucose-Capillary: 240 mg/dL — ABNORMAL HIGH (ref 65–99)

## 2017-02-11 LAB — CK: Total CK: 286 U/L (ref 49–397)

## 2017-02-11 LAB — I-STAT TROPONIN, ED: Troponin i, poc: 0 ng/mL (ref 0.00–0.08)

## 2017-02-11 LAB — ETHANOL: Alcohol, Ethyl (B): 64 mg/dL — ABNORMAL HIGH (ref <5)

## 2017-02-11 MED ORDER — HYDROCHLOROTHIAZIDE 25 MG PO TABS
25.0000 mg | ORAL_TABLET | Freq: Every day | ORAL | Status: DC
Start: 2017-02-11 — End: 2017-02-11
  Administered 2017-02-11: 25 mg via ORAL
  Filled 2017-02-11: qty 1

## 2017-02-11 MED ORDER — INSULIN ASPART 100 UNIT/ML ~~LOC~~ SOLN
4.0000 [IU] | Freq: Once | SUBCUTANEOUS | Status: AC
  Administered 2017-02-11: 4 [IU] via INTRAVENOUS
  Filled 2017-02-11: qty 1

## 2017-02-11 MED ORDER — AMLODIPINE BESYLATE 5 MG PO TABS
5.0000 mg | ORAL_TABLET | Freq: Every day | ORAL | Status: DC
  Administered 2017-02-11: 5 mg via ORAL
  Filled 2017-02-11: qty 1

## 2017-02-11 MED ORDER — ASPIRIN 81 MG PO CHEW
324.0000 mg | CHEWABLE_TABLET | Freq: Once | ORAL | Status: AC
  Administered 2017-02-11: 324 mg via ORAL
  Filled 2017-02-11: qty 4

## 2017-02-11 MED ORDER — INSULIN ASPART 100 UNIT/ML ~~LOC~~ SOLN
8.0000 [IU] | Freq: Once | SUBCUTANEOUS | Status: AC
Start: 2017-02-11 — End: 2017-02-11
  Administered 2017-02-11: 8 [IU] via INTRAVENOUS
  Filled 2017-02-11: qty 1

## 2017-02-11 MED ORDER — KETOROLAC TROMETHAMINE 30 MG/ML IJ SOLN
30.0000 mg | Freq: Once | INTRAMUSCULAR | Status: AC
  Administered 2017-02-11: 30 mg via INTRAVENOUS
  Filled 2017-02-11: qty 1

## 2017-02-11 MED ORDER — CLONIDINE HCL 0.2 MG PO TABS
0.1000 mg | ORAL_TABLET | Freq: Two times a day (BID) | ORAL | Status: DC
  Administered 2017-02-11 (×2): 0.1 mg via ORAL
  Filled 2017-02-11 (×2): qty 1

## 2017-02-11 MED ORDER — SODIUM CHLORIDE 0.9 % IV BOLUS (SEPSIS)
1000.0000 mL | Freq: Once | INTRAVENOUS | Status: AC
  Administered 2017-02-11: 1000 mL via INTRAVENOUS

## 2017-02-11 MED ORDER — DULOXETINE HCL 30 MG PO CPEP
30.0000 mg | ORAL_CAPSULE | Freq: Two times a day (BID) | ORAL | Status: DC
  Administered 2017-02-11 (×2): 30 mg via ORAL
  Filled 2017-02-11 (×2): qty 1

## 2017-02-11 MED ORDER — ACETAMINOPHEN 325 MG PO TABS: 650.0000 mg | ORAL_TABLET | Freq: Four times a day (QID) | ORAL | Status: DC | PRN

## 2017-02-11 MED ORDER — METFORMIN HCL 500 MG PO TABS
500.0000 mg | ORAL_TABLET | Freq: Two times a day (BID) | ORAL | Status: DC
Start: 2017-02-11 — End: 2017-02-11
  Administered 2017-02-11: 500 mg via ORAL
  Filled 2017-02-11: qty 1

## 2017-02-11 NOTE — ED Notes (Signed)
Pt will be discharged with close follow up by primary doctor, no harm contract signed and patient denies any concerns with dc

## 2017-02-11 NOTE — ED Provider Notes (Addendum)
By signing my name below, I, Avnee Patel, attest that this documentation has been prepared under the direction and in the presence of [place], DO  Electronically Signed: [place], ED Scribe. 02/11/17. 1:07 AM.  TIME SEEN: 12:54 AM  CHIEF COMPLAINT:  Chief Complaint  Patient presents with  . Chest Pain   HPI:   [name] is a 56 y.o. male, with a PMHx of DM and HTN, Schizophrenia, substance abuse who presents to the Emergency Department complaining of acute onset, moderate, central chest pain x 12:00 PM yesterday. He describes his pain as a pins and needle sensation and additionally as a pressure sensation. He states his pain radiates to his back. Pain is been constant. Pt also reports bilateral lower extremity pain, a tingling sensation to his toes and fingers x 1 hour, a dry cough x 2 weeks and intermittent SOB. Pt reports auditory hallucinations which tell him to kill himself and other people x the past several days. Reports he has constant auditory hallucinations telling him to kill other people but the hallucinations selling him to kill himself or new. Pt states he last consumed alcohol (4 beers) tonight and last used cocaine 2 nights ago. No alleviating or modifying factors noted. Pt denies nausea, vomiting, current SOB or any other associated symptoms. Pt is a smoker. No history of PE or DVT. No hx of stress test or cardiac catheterization. No other complaints noted. Patient is currently homeless.  ROS: See HPI Constitutional: no fever  Eyes: no drainage  ENT: no runny nose   Cardiovascular:  + chest pain  Resp: + SOB  GI: no vomiting GU: no dysuria Integumentary: no rash  Allergy: no hives  Musculoskeletal: no leg swelling  Neurological: no slurred speech ROS otherwise negative  PAST MEDICAL HISTORY/PAST SURGICAL HISTORY:  Past Medical History:  Diagnosis Date  . Alcohol abuse   . Arthritis   . Crack cocaine use   . Depression   . Diabetes mellitus    . Drug abuse   . DTs (delirium tremens) ([place])    history of   . Gout   . Noncompliance with medication regimen   . Schizophrenia ([place])   . Stroke ([place])   . Uncontrolled hypertension     MEDICATIONS:  Prior to Admission medications   Medication Sig Start Date End Date Taking? Authorizing Provider  acetaminophen (TYLENOL) 325 MG tablet Take 650 mg by mouth every 6 (six) hours as needed for mild pain or headache.    Historical Provider, MD  amLODipine (NORVASC) 5 MG tablet Take 1 tablet (5 mg total) by mouth daily. 01/09/17   Patrecia Pour, NP  cloNIDine (CATAPRES) 0.1 MG tablet Take 1 tablet (0.1 mg total) by mouth 2 (two) times daily. 01/09/17   Patrecia Pour, NP  DULoxetine (CYMBALTA) 30 MG capsule Take 1 capsule (30 mg total) by mouth 2 (two) times daily. 01/09/17   Patrecia Pour, NP  hydrochlorothiazide (HYDRODIURIL) 25 MG tablet Take 1 tablet (25 mg total) by mouth daily. 01/09/17   Patrecia Pour, NP  hydrOXYzine (ATARAX/VISTARIL) 25 MG tablet Take 1 tablet (25 mg total) by mouth 3 (three) times daily as needed for anxiety. 01/09/17   Patrecia Pour, NP  metFORMIN (GLUCOPHAGE) 500 MG tablet Take 1 tablet (500 mg total) by mouth 2 (two) times daily with a meal. 01/09/17   Patrecia Pour, NP  paliperidone (INVEGA SUSTENNA) 156 MG/ML SUSP injection Inject 156 mg into the muscle every 30 (  thirty) days.    Historical Provider, MD    ALLERGIES:  No Known Allergies  SOCIAL HISTORY:  Social History  Substance Use Topics  . Smoking status: Current Every Day Smoker    Packs/day: 0.25    Types: Cigarettes  . Smokeless tobacco: Never Used  . Alcohol use 0.6 - 1.2 oz/week    1 - 2 Cans of beer per week     Comment: [facility]    FAMILY HISTORY: Family History  Problem Relation Age of Onset  . Hypertension      EXAM: BP (!) 142/106 (BP Location: Left Arm)   Pulse (!) 103   Temp 98.7 F (37.1 C) (Oral)   Resp 16   Ht 5\' 11"  (1.803 m)   Wt 190 lb (86.2 kg)   SpO2 100%   BMI  26.50 kg/m  CONSTITUTIONAL: Alert and oriented and responds appropriately to questions. Chronically ill appearing.  HEAD: Normocephalic EYES: Conjunctivae clear, pupils appear equal, EOMI ENT: normal nose; moist mucous membranes NECK: Supple, no meningismus, no nuchal rigidity, no LAD  CARD: RRR; S1 and S2 appreciated; no murmurs, no clicks, no rubs, no gallops CHEST:  Chest wall is mildly tender to palpation.  No crepitus, ecchymosis, erythema, warmth, rash or other lesions present.   RESP: Normal chest excursion without splinting or tachypnea; breath sounds clear and equal bilaterally; no wheezes, no rhonchi, no rales, no hypoxia or respiratory distress, speaking full sentences ABD/GI: Normal bowel sounds; non-distended; soft, non-tender, no rebound, no guarding, no peritoneal signs, no hepatosplenomegaly BACK:  The back appears normal and is non-tender to palpation, there is no CVA tenderness EXT: Normal ROM in all joints; non-tender to palpation; no edema; normal capillary refill; no cyanosis, no calf tenderness or swelling    SKIN: Normal color for age and race; warm; no rash NEURO: Moves all extremities equally; Strength 5/5 in all four extremities.  Normal sensation diffusely.  CN 2-12 grossly intact.  No dysmetria to finger to nose testing bilaterally.  Normal speech.   PSYCH: SI and HI. Auditory hallucinations. Grooming and personal hygiene are appropriate.  MEDICAL DECISION MAKING: Patient here with multiple complaints. He reports chest pain that has been present for several hours and has been constant. He does have a history of cocaine abuse, hypertension and diabetes. No history of stress test or cardiac catheterization. First troponin is negative. EKG shows no new ischemic abnormality. We'll obtain serial troponins given patient is at risk for ACS but this seems very atypical. He describes the pain right now as "pins and needles". Also could be from his crack cocaine use. Patient also  complaining of tingling in bilateral fingers and toes. Doubt that this is a stroke given his bilateral and he has a normal neurologic exam. Could be neuropathy. He is hyperglycemic here reports noncompliance with his medications. He is not in DKA. We'll give IV fluids, IV insulin. Also reports feeling muscle pain. We'll check a CK level. His creatinine is currently normal. Once patient is medically cleared if he is still having SI and unable to contract for safety we will consult behavioral health. He was just recently seen by behavioral health and they felt that he was not a candidate for inpatient treatment and he was discharged.  ED PROGRESS: 3:20 AM  Patient's second troponin is negative. Blood glucose has improved. Blood pressure has also improved. CK is normal. ETOH 64.  At this time patient is medically cleared. We'll consult TTS.  Dispo per psych.  I reviewed all nursing notes, vitals, pertinent previous records, EKGs, lab and urine results, imaging (as available).    EKG Interpretation  Date/Time:  Tuesday February 11 2017 00:56:19 EDT Ventricular Rate:  94 PR Interval:    QRS Duration: 107 QT Interval:  365 QTC Calculation: 457 R Axis:   -13 Text Interpretation:  Sinus rhythm Biatrial enlargement Incomplete left bundle branch block Left ventricular hypertrophy Anterior ST elevation, probably due to LVH No significant change since last tracing Confirmed by WARD,  DO, KRISTEN ([id number]) on 02/11/2017 1:12:06 AM       I personally performed the services described in this documentation, which was scribed in my presence. The recorded information has been reviewed and is accurate.     [place], DO 02/11/17 0325    4:20 AM  Pt to have AM psych re-eval per [name] PA with TTS.   [place], DO 02/11/17 [phone]

## 2017-02-11 NOTE — Consult Note (Signed)
[place]   Reason for Consult:  Cocaine abuse with suicidal ideation Referring Physician:  EDP Patient Identification: [name] MRN:  [id number] Principal Diagnosis: Cocaine abuse with cocaine-induced mood disorder [facility]) Diagnosis:   Patient Active Problem List   Diagnosis Date Noted  . Cocaine abuse with cocaine-induced mood disorder [facility]) [F14.14] 12/05/2016    Priority: High  . 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 Schizoaffective disorder, depressive type ([place]) [F25.1]     Priority: High  . Cerebrovascular disease [I67.9] 09/02/2015  . 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 Malignant hypertension [I10] 08/30/2015  . Alcohol use disorder, severe, dependence ([place]) [F10.20] 08/30/2015  . Alcohol withdrawal ([place]) [F10.239] 08/30/2015  . Thrombocytopenia ([place]) [D69.6] 08/27/2015  . Tobacco use disorder [F17.200] 08/26/2015  . Hyperlipidemia [E78.5] 04/28/2015  . Gout [M10.9] 08/09/2013  . Diabetes mellitus ([place]) [E11.9] 07/09/2012  . Polysubstance abuse [F19.10] 07/09/2012    Total Time spent with patient: 45 minutes  Subjective:   [name] is a 56 y.o. male patient seen for acute onset of auditory hallucinations. Pt seen and chart reviewed. Pt is alert/oriented x4, calm, cooperative, and appropriate to situation. Pt denies suicidal/homicidal ideation and psychosis and does not appear to be responding to internal stimuli. Pt reports that he "feels much better today" and admits that he was intoxicated upon arrival. He knows his treatment options and reports that he has [facility] ACT Team as his provider for his psychiatric care and would like to follow-up with them.   HPI: I have reviewed and concur with HPI elements below, modified as follows: "[name] is an  56 y.o. male who presents to the ED voluntarily. Pt was D/C from [facility] on 03/26/8 at 12pm and returned to [facility] at 23:32. Pt reports he returned to the ED due to chest pains. Pt denies any current SI or HI. Pt reports he experiences command AVH that tell him to kill himself and others but denies any thoughts. Pt admits to consuming 4 beers after he was d/c from [facility] today prior to returning back to [facility]. Pt has a hx of inpt hospitalizations due to schizophrenia and reports he receives OPT through [employer] [facility]. 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 Pt reports he is currently homeless and awaiting housing assistance through [place]."   Seen today for psychiatric evaluation as above on 02/11/17. However, pt was just discharged from [facility] on 02/10/17 at which time he received his IM Invega (long-acting) the day prior when he arrived there. Pt presented to [facility] a few hours later intoxicated on ETOH and cocaine.  Past Psychiatric History: schizoaffective disorder  Risk to Self: None Risk to Others: Homicidal Ideation: No-Not Currently/Within Last 6 Months Thoughts of Harm to Others: No Current Homicidal Intent: No Current Homicidal Plan: No Access to Homicidal Means: No History of harm to others?: No Assessment of Violence: None Noted Does patient have access to weapons?: No Criminal Charges Pending?: No Does patient have a court date: No Prior Inpatient Therapy: Prior Inpatient Therapy: Yes Prior Therapy Dates: 2007, 2015, 2016, Prior Therapy Facilty/Provider(s): [place], [place], [facility] Reason for Treatment: Schizoaffective d/o, MDD Prior Outpatient Therapy: Prior Outpatient Therapy: Yes Prior Therapy Dates: current Prior Therapy Facilty/Provider(s): [name] Reason for Treatment: [name] team Does patient have an ACCT team?: Yes Does patient have Intensive In-House Services?  : No Does patient  have [name] services? : Yes Does patient have P4CC services?: No  Past Medical History:   Past Medical History:  Diagnosis Date  . Alcohol abuse   . Arthritis   . Crack cocaine use   . Depression   . Diabetes mellitus   . Drug abuse   . DTs (delirium tremens) ([place])    history of   . Gout   . Noncompliance with medication regimen   . Schizophrenia ([place])   . Stroke ([place])   . Uncontrolled hypertension     Past Surgical History:  Procedure Laterality Date  . MANDIBLE RECONSTRUCTION     Family History:  Family History  Problem Relation Age of Onset  . Hypertension     Family Psychiatric  History: unknown Social History:  History  Alcohol Use  . 0.6 - 1.2 oz/week  . 1 - 2 Cans of beer per week    Comment: [facility]     History  Drug Use  . Types: "Crack" cocaine, Cocaine    Social History   Social History  . Marital status: Legally Separated    Spouse name: N/A  . Number of children: N/A  . Years of education: N/A   Social History Main Topics  . Smoking status: Current Every Day Smoker    Packs/day: 0.25    Types: Cigarettes  . Smokeless tobacco: Never Used  . Alcohol use 0.6 - 1.2 oz/week    1 - 2 Cans of beer per week     Comment: [facility]  . Drug use: Yes    Types: "Crack" cocaine, Cocaine  . Sexual activity: Not Asked   Other Topics Concern  . None   Social History Narrative  . None   Additional Social History:    Allergies:  No Known Allergies  Labs:  Results for orders placed or performed during the hospital encounter of 02/11/17 (from the past 48 hour(s))  Basic metabolic panel     Status: Abnormal   Collection Time: 02/10/17 11:29 PM  Result Value Ref Range   Sodium 131 (L) 135 - 145 mmol/L   Potassium 3.4 (L) 3.5 - 5.1 mmol/L   Chloride 93 (L) 101 - 111 mmol/L   CO2 25 22 - 32 mmol/L   Glucose, Bld 423 (H) 65 - 99 mg/dL   BUN 11 6 - 20 mg/dL   Creatinine, Ser 0.90 0.61 - 1.24 mg/dL   Calcium 9.2 8.9 - 10.3 mg/dL   GFR calc non Af Amer >60 >60 mL/min   GFR calc Af Amer >60 >60 mL/min    Comment: (NOTE) The  eGFR has been calculated using the CKD EPI equation. This calculation has not been validated in all clinical situations. eGFR's persistently <60 mL/min signify possible Chronic Kidney Disease.    Anion gap 13 5 - 15  CBC     Status: Abnormal   Collection Time: 02/10/17 11:29 PM  Result Value Ref Range   WBC 6.2 4.0 - 10.5 K/uL   RBC 4.32 4.22 - 5.81 MIL/uL   Hemoglobin 12.8 (L) 13.0 - 17.0 g/dL   HCT 38.4 (L) 39.0 - 52.0 %   MCV 88.9 78.0 - 100.0 fL   MCH 29.6 26.0 - 34.0 pg   MCHC 33.3 30.0 - 36.0 g/dL   RDW 12.1 11.5 - 15.5 %   Platelets 128 (L) 150 - 400 K/uL  I-stat troponin, ED     Status: None   Collection Time: 02/10/17 11:41 PM  Result Value  Ref Range   Troponin i, poc 0.02 0.00 - 0.08 ng/mL   Comment 3            Comment: Due to the release kinetics of cTnI, a negative result within the first hours of the onset of symptoms does not rule out myocardial infarction with certainty. If myocardial infarction is still suspected, repeat the test at appropriate intervals.   Rapid urine drug screen (hospital performed)     Status: Abnormal   Collection Time: 02/10/17 11:53 PM  Result Value Ref Range   Opiates NONE DETECTED NONE DETECTED   Cocaine POSITIVE (A) NONE DETECTED   Benzodiazepines NONE DETECTED NONE DETECTED   Amphetamines NONE DETECTED NONE DETECTED   Tetrahydrocannabinol NONE DETECTED NONE DETECTED   Barbiturates NONE DETECTED NONE DETECTED    Comment:        DRUG SCREEN FOR MEDICAL PURPOSES ONLY.  IF CONFIRMATION IS NEEDED FOR ANY PURPOSE, NOTIFY LAB WITHIN 5 DAYS.        LOWEST DETECTABLE LIMITS FOR URINE DRUG SCREEN Drug Class       Cutoff (ng/mL) Amphetamine      1000 Barbiturate      200 Benzodiazepine   696 Tricyclics       295 Opiates          300 Cocaine          300 THC              50   Urinalysis, Routine w reflex microscopic     Status: Abnormal   Collection Time: 02/11/17 12:38 AM  Result Value Ref Range   Color, Urine YELLOW YELLOW    APPearance CLEAR CLEAR   Specific Gravity, Urine 1.022 1.005 - 1.030   pH 5.0 5.0 - 8.0   Glucose, UA >=500 (A) NEGATIVE mg/dL   Hgb urine dipstick SMALL (A) NEGATIVE   Bilirubin Urine NEGATIVE NEGATIVE   Ketones, ur NEGATIVE NEGATIVE mg/dL   Protein, ur NEGATIVE NEGATIVE mg/dL   Nitrite NEGATIVE NEGATIVE   Leukocytes, UA NEGATIVE NEGATIVE   RBC / HPF 0-5 0 - 5 RBC/hpf   WBC, UA NONE SEEN 0 - 5 WBC/hpf   Bacteria, UA NONE SEEN NONE SEEN   Squamous Epithelial / LPF NONE SEEN NONE SEEN   Mucous PRESENT   Hepatic function panel     Status: Abnormal   Collection Time: 02/11/17  1:01 AM  Result Value Ref Range   Total Protein 6.4 (L) 6.5 - 8.1 g/dL   Albumin 3.4 (L) 3.5 - 5.0 g/dL   AST 156 (H) 15 - 41 U/L   ALT 61 17 - 63 U/L   Alkaline Phosphatase 63 38 - 126 U/L   Total Bilirubin 0.6 0.3 - 1.2 mg/dL   Bilirubin, Direct 0.2 0.1 - 0.5 mg/dL   Indirect Bilirubin 0.4 0.3 - 0.9 mg/dL  CK     Status: None   Collection Time: 02/11/17  1:01 AM  Result Value Ref Range   Total CK 286 49 - 397 U/L  Ethanol     Status: Abnormal   Collection Time: 02/11/17  1:02 AM  Result Value Ref Range   Alcohol, Ethyl (B) 64 (H) <5 mg/dL    Comment:        LOWEST DETECTABLE LIMIT FOR SERUM ALCOHOL IS 5 mg/dL FOR MEDICAL PURPOSES ONLY   I-stat troponin, ED     Status: None   Collection Time: 02/11/17  3:14 AM  Result Value Ref Range  Troponin i, poc 0.00 0.00 - 0.08 ng/mL   Comment 3            Comment: Due to the release kinetics of cTnI, a negative result within the first hours of the onset of symptoms does not rule out myocardial infarction with certainty. If myocardial infarction is still suspected, repeat the test at appropriate intervals.   CBG monitoring, ED     Status: Abnormal   Collection Time: 02/11/17  3:15 AM  Result Value Ref Range   Glucose-Capillary 257 (H) 65 - 99 mg/dL   Comment 1 Notify RN    Comment 2 Document in Chart   CBG monitoring, ED     Status: Abnormal    Collection Time: 02/11/17  6:47 AM  Result Value Ref Range   Glucose-Capillary 240 (H) 65 - 99 mg/dL   Comment 1 Notify RN    Comment 2 Document in Chart     Current Facility-Administered Medications  Medication Dose Route Frequency Provider Last Rate Last Dose  . acetaminophen (TYLENOL) tablet 650 mg  650 mg Oral Q6H PRN Kristen N Ward, DO      . amLODipine (NORVASC) tablet 5 mg  5 mg Oral Daily Kristen N Ward, DO   5 mg at 02/11/17 0904  . cloNIDine (CATAPRES) tablet 0.1 mg  0.1 mg Oral BID Kristen N Ward, DO   0.1 mg at 02/11/17 0904  . DULoxetine (CYMBALTA) DR capsule 30 mg  30 mg Oral BID Kristen N Ward, DO   30 mg at 02/11/17 0904  . hydrochlorothiazide (HYDRODIURIL) tablet 25 mg  25 mg Oral Daily Kristen N Ward, DO   25 mg at 02/11/17 4917  . metFORMIN (GLUCOPHAGE) tablet 500 mg  500 mg Oral BID WC Kristen N Ward, DO   500 mg at 02/11/17 9150   Current Outpatient Prescriptions  Medication Sig Dispense Refill  . acetaminophen (TYLENOL) 325 MG tablet Take 650 mg by mouth every 6 (six) hours as needed for mild pain or headache.    Marland Kitchen amLODipine (NORVASC) 5 MG tablet Take 1 tablet (5 mg total) by mouth daily. 30 tablet 0  . cloNIDine (CATAPRES) 0.1 MG tablet Take 1 tablet (0.1 mg total) by mouth 2 (two) times daily. 60 tablet 0  . DULoxetine (CYMBALTA) 30 MG capsule Take 1 capsule (30 mg total) by mouth 2 (two) times daily. 60 capsule 0  . hydrochlorothiazide (HYDRODIURIL) 25 MG tablet Take 1 tablet (25 mg total) by mouth daily. 30 tablet 0  . hydrOXYzine (ATARAX/VISTARIL) 25 MG tablet Take 1 tablet (25 mg total) by mouth 3 (three) times daily as needed for anxiety. 30 tablet 0  . metFORMIN (GLUCOPHAGE) 500 MG tablet Take 1 tablet (500 mg total) by mouth 2 (two) times daily with a meal. 60 tablet 0  . paliperidone (INVEGA SUSTENNA) 156 MG/ML SUSP injection Inject 156 mg into the muscle every 30 (thirty) days.      Musculoskeletal: UTO, camera  Psychiatric Specialty Exam: Physical  Exam  Constitutional: He is oriented to person, place, and time.  Musculoskeletal: Normal range of motion.  Neurological: He is alert and oriented to person, place, and time.  Psychiatric: His speech is normal and behavior is normal. Judgment and thought content normal. Cognition and memory are normal. He exhibits a depressed mood.    Review of Systems  Psychiatric/Behavioral: Positive for depression and substance abuse. Negative for hallucinations and suicidal ideas. The patient is not nervous/anxious and does not have insomnia.  All other systems reviewed and are negative.   Blood pressure (!) 140/92, pulse 85, temperature 98.7 F (37.1 C), temperature source Oral, resp. rate 16, height '5\' 11"'  (1.803 m), weight 86.2 kg (190 lb), SpO2 98 %.Body mass index is 26.5 kg/m.  General Appearance: Casual and Fairly Groomed  Eye Contact:  Good  Speech:  Normal Rate  Volume:  Normal  Mood:  Euthymic  Affect:  Congruent  Thought Process:  Coherent and Descriptions of Associations: Intact  Orientation:  Full (Time, Place, and Person)  Thought Content:  Focused on discharge plans with his [name] ACT Team  Suicidal Thoughts:  No  Homicidal Thoughts:  No  Memory:  Immediate;   Fair Recent;   Fair Remote;   Fair  Judgement:  Fair  Insight:  Fair  Psychomotor Activity:  Normal  Concentration:  Concentration: Good and Attention Span: Good  Recall:  Good  Fund of Knowledge:  Fair  Language:  Good  Akathisia:  No  Handed:  Right  AIMS (if indicated):     Assets:  Leisure Time Physical Health Resilience Social Support  ADL's:  Intact  Cognition:  WNL  Sleep:      Treatment Plan Summary: Cocaine abuse with cocaine-induced mood disorder (HCC) with schizoaffective, bipolar (exacerbation from substance abuse), with concomitant acute ETOH intoxication (resolved), stable for outpatient management with his ACT Team from [place]  Medications: -ACT Team manages these: Cymbalta 30 mg BID for  depression, Invega 156 mg IM q21 days(given on 02/09/17), and Vistaril 25 mg TID PRN anxiety   Disposition: No evidence of imminent risk to self or others at present.   Patient does not meet criteria for psychiatric inpatient admission. Supportive therapy provided about ongoing stressors. Discussed crisis plan, support from social network, calling 911, coming to the Emergency Department, and calling Suicide Hotline. SW at [facility] to contact [facility]; pt may be discharged  [name], [place] 02/11/2017 1:23 PM

## 2017-02-11 NOTE — ED Provider Notes (Signed)
Psych team called me. [name] from psych team informed me that patient had come in with SI. Patient was just discharged from the hospital for SI. They did a complete assessment again, pt was staffed with Dr. Dwyane Dee, and patient is deemed safe for d/c. Patient's ACT team has been informed for close f/u.   [name] Biles, MD 02/11/17 1320

## 2017-02-11 NOTE — ED Notes (Signed)
Pt reports he was just discharged out of the hospital and has been having chest pain since. Pt states he told the doctors and nurses before he was discharged that he was having pain but they didn't do anything about it.

## 2017-02-11 NOTE — ED Notes (Signed)
E-signature broken pt denies concerns

## 2017-02-11 NOTE — Progress Notes (Signed)
Inpatient Diabetes Program Recommendations  AACE/ADA: New Consensus Statement on Inpatient Glycemic Control (2015)  Target Ranges:  Prepandial:   less than 140 mg/dL      Peak postprandial:   less than 180 mg/dL (1-2 hours)      Critically ill patients:  140 - 180 mg/dL   Lab Results  Component Value Date   GLUCAP 240 (H) 02/11/2017   HGBA1C 7.6 (H) 08/30/2015   Review of Glycemic Control  Diabetes history: DM 2 Outpatient Diabetes medications: Metformin 500 mg BID Current orders for Inpatient glycemic control: Metformin 500 mg BID  Inpatient Diabetes Program Recommendations:   Patient's glucose above inpatient goal. Even if patient transports to [facility] they consult Diabetes Coordinators a patient will be placed on insulin. Please consider adding Novolog Moderate Correction tid + Novolog HS scale. Please also order a Carb Modified Heart Healthy diet.  Thanks,  Tama Headings RN, MSN, [facility] Inpatient Diabetes Coordinator Team Pager [phone] (8a-5p)

## 2017-02-11 NOTE — ED Notes (Signed)
AM psych eval per [facility]

## 2017-02-11 NOTE — BH Assessment (Addendum)
Tele Assessment Note   [name] is an 56 y.o. male who presents to the ED voluntarily. Pt was D/C from [facility] on 03/26/8 at 12pm and returned to [facility] at 23:32. Pt reports he returned to the ED due to chest pains. Pt denies any current SI or HI. Pt reports he experiences command AVH that tell him to kill himself and others but denies any thoughts. Pt admits to consuming 4 beers after he was d/c from [facility] today prior to returning back to [facility]. Pt has a hx of inpt hospitalizations due to schizophrenia and reports he receives OPT through [facility]. 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 Pt reports he is currently homeless and awaiting housing assistance through [place].    Per [name], PA pt will need an AM psych eval to determine final disposition due to hx of SI and AVH. Chelsea, RN has been notified of the recommended disposition.     Diagnosis: Schizophrenia; Cocaine Use D/O; Alcohol Use D/O   Past Medical History:  Past Medical History:  Diagnosis Date   Alcohol abuse    Arthritis    Crack cocaine use    Depression    Diabetes mellitus    Drug abuse    DTs (delirium tremens) ([place])    history of    Gout    Noncompliance with medication regimen    Schizophrenia ([place])    Stroke ([place])    Uncontrolled hypertension     Past Surgical History:  Procedure Laterality Date   MANDIBLE RECONSTRUCTION      Family History:  Family History  Problem Relation Age of Onset   Hypertension      Social History:  reports that he has been smoking Cigarettes.  He has been smoking about 0.25 packs per day. He has never used smokeless tobacco. He reports that he drinks about 0.6 - 1.2 oz of alcohol per week . He reports that he uses drugs, including "Crack" cocaine and Cocaine.  Additional Social History:  Alcohol / Drug Use Pain Medications: See PTA meds  Prescriptions: See PTA meds  Over the Counter: See PTA meds  History of alcohol / drug use?:  Yes Substance #1 Name of Substance 1: Alcohol 1 - Age of First Use: 19 1 - Amount (size/oz): 4 beers  1 - Frequency: daily 1 - Duration: ongoing 1 - Last Use / Amount: 02/10/17 Substance #2 Name of Substance 2: Cocaine 2 - Age of First Use: 22 2 - Amount (size/oz): $75 worth 2 - Frequency: daily 2 - Duration: ongoing 2 - Last Use / Amount: 02/07/17  CIWA: CIWA-Ar BP: (!) 147/95 Pulse Rate: 88 COWS:    PATIENT STRENGTHS: (choose at least two) Capable of independent living Occupational psychologist fund of knowledge Motivation for treatment/growth  Allergies: No Known Allergies  Home Medications:  (Not in a hospital admission)  OB/GYN Status:  No LMP for male patient.  General Assessment Data Location of Assessment: [facility] ED TTS Assessment: In system Is this a Tele or Face-to-Face Assessment?: Tele Assessment Is this an Initial Assessment or a Re-assessment for this encounter?: Initial Assessment Marital status: Separated Is patient pregnant?: No Pregnancy Status: No Living Arrangements: Other (Comment) (homeless) Can pt return to current living arrangement?: Yes Admission Status: Voluntary Is patient capable of signing voluntary admission?: Yes Referral Source: Self/Family/Friend Insurance type: Medicaid     Crisis Care Plan Living Arrangements: Other (Comment) (homeless) Name of Psychiatrist: [place] Name of Therapist:  [name]  Education Status Is patient currently in school?: No Highest grade of school patient has completed: 10th  Risk to self with the past 6 months Suicidal Ideation: No-Not Currently/Within Last 6 Months Has patient been a risk to self within the past 6 months prior to admission? : Yes Suicidal Intent: No Has patient had any suicidal intent within the past 6 months prior to admission? : No Is patient at risk for suicide?: No Suicidal Plan?: No Has patient had any suicidal plan within the past 6 months  prior to admission? : No Access to Means: No What has been your use of drugs/alcohol within the last 12 months?: reports to daily alcohol and cocaine use  Previous Attempts/Gestures: Yes How many times?: 1 Triggers for Past Attempts: Unpredictable Intentional Self Injurious Behavior: None Family Suicide History: No Recent stressful life event(s): Loss (Comment), Other (Comment) (homeless, chest pains ) Persecutory voices/beliefs?: No Depression: Yes Depression Symptoms: Fatigue, Insomnia, Loss of interest in usual pleasures Substance abuse history and/or treatment for substance abuse?: No Suicide prevention information given to non-admitted patients: Not applicable  Risk to Others within the past 6 months Homicidal Ideation: No-Not Currently/Within Last 6 Months Does patient have any lifetime risk of violence toward others beyond the six months prior to admission? : No Thoughts of Harm to Others: No Current Homicidal Intent: No Current Homicidal Plan: No Access to Homicidal Means: No History of harm to others?: No Assessment of Violence: None Noted Does patient have access to weapons?: No Criminal Charges Pending?: No Does patient have a court date: No Is patient on probation?: No  Psychosis Hallucinations: Auditory, Visual, With command Delusions: None noted  Mental Status Report Appearance/Hygiene: Disheveled Eye Contact: Good Motor Activity: Freedom of movement Speech: Logical/coherent Level of Consciousness: Alert Mood: Helpless Affect: Flat Anxiety Level: Minimal Thought Processes: Relevant, Coherent Judgement: Partial Orientation: Person, Place, Time, Situation, Appropriate for developmental age Obsessive Compulsive Thoughts/Behaviors: None  Cognitive Functioning Concentration: Normal Memory: Remote Intact, Recent Intact IQ: Average Insight: Poor Impulse Control: Poor Appetite: Poor Sleep: Decreased Total Hours of Sleep: 4 Vegetative Symptoms:  None  ADLScreening [facility] Assessment Services) Patient's cognitive ability adequate to safely complete daily activities?: Yes Patient able to express need for assistance with ADLs?: Yes Independently performs ADLs?: Yes (appropriate for developmental age)  Prior Inpatient Therapy Prior Inpatient Therapy: Yes Prior Therapy Dates: 2007, 2015, 2016, Prior Therapy Facilty/Provider(s): [facility], [facility], [facility] Reason for Treatment: Schizoaffective d/o, MDD  Prior Outpatient Therapy Prior Outpatient Therapy: Yes Prior Therapy Dates: current Prior Therapy Facilty/Provider(s): [name] team Reason for Treatment: [name] team Does patient have an ACCT team?: Yes Does patient have Intensive In-House Services?  : No Does patient have [name] services? : Yes Does patient have P4CC services?: No  ADL Screening (condition at time of admission) Patient's cognitive ability adequate to safely complete daily activities?: Yes Is the patient deaf or have difficulty hearing?: No Does the patient have difficulty seeing, even when wearing glasses/contacts?: No Does the patient have difficulty concentrating, remembering, or making decisions?: No Patient able to express need for assistance with ADLs?: Yes Does the patient have difficulty dressing or bathing?: No Independently performs ADLs?: Yes (appropriate for developmental age) Does the patient have difficulty walking or climbing stairs?: No Weakness of Legs: None Weakness of Arms/Hands: None  Home Assistive Devices/Equipment Home Assistive Devices/Equipment: None    Abuse/Neglect Assessment (Assessment to be complete while patient is alone) Physical Abuse: Denies Verbal Abuse: Denies Sexual Abuse: Denies Exploitation of  patient/patient's resources: Denies Self-Neglect: Denies     Regulatory affairs officer (For Healthcare) Does Patient Have a Medical Advance Directive?: No Would patient like information on creating a medical advance directive?: No  - Patient declined    Additional Information 1:1 In Past 12 Months?: No CIRT Risk: No Elopement Risk: No Does patient have medical clearance?: Yes     Disposition:  Disposition Initial Assessment Completed for this Encounter: Yes Disposition of Patient: Other dispositions Other disposition(s): Other (Comment) (AM psych eval per [name], [place] )  Lyanne Co 02/11/2017 4:06 AM

## 2017-02-11 NOTE — ED Notes (Addendum)
Pt belongings removed from room and at nursing desk. Pt in wine scrubs and wanded by security

## 2017-02-11 NOTE — Discharge Instructions (Signed)
We saw you in the ER for your mental health concerns and had our behavioral health team evaluate you. The team feels comfortable sending you home, please follow the recommendations given to you by them and the follow-up and medications they have prescribed to you. Please refrain from substance abuse. Return to the ER if your symptoms worsen.  

## 2017-03-03 ENCOUNTER — Encounter (HOSPITAL_COMMUNITY): Payer: Self-pay | Admitting: Emergency Medicine

## 2017-03-03 ENCOUNTER — Inpatient Hospital Stay (HOSPITAL_COMMUNITY)
Admission: EM | Admit: 2017-03-03 | Discharge: 2017-03-06 | DRG: 305 | Disposition: A | Discharge status: Home Health | Payer: Medicaid Other | Attending: Family Medicine | Admitting: Family Medicine

## 2017-03-03 ENCOUNTER — Emergency Department (HOSPITAL_COMMUNITY): Payer: Medicaid Other

## 2017-03-03 DIAGNOSIS — D696 Thrombocytopenia, unspecified: Secondary | ICD-10-CM | POA: Diagnosis present

## 2017-03-03 DIAGNOSIS — I160 Hypertensive urgency: Principal | ICD-10-CM | POA: Diagnosis present

## 2017-03-03 DIAGNOSIS — F1010 Alcohol abuse, uncomplicated: Secondary | ICD-10-CM | POA: Insufficient documentation

## 2017-03-03 DIAGNOSIS — Z79899 Other long term (current) drug therapy: Secondary | ICD-10-CM | POA: Diagnosis not present

## 2017-03-03 DIAGNOSIS — R51 Headache: Secondary | ICD-10-CM | POA: Diagnosis present

## 2017-03-03 DIAGNOSIS — F329 Major depressive disorder, single episode, unspecified: Secondary | ICD-10-CM | POA: Diagnosis present

## 2017-03-03 DIAGNOSIS — T502X6A Underdosing of carbonic-anhydrase inhibitors, benzothiadiazides and other diuretics, initial encounter: Secondary | ICD-10-CM | POA: Diagnosis present

## 2017-03-03 DIAGNOSIS — R531 Weakness: Secondary | ICD-10-CM | POA: Diagnosis present

## 2017-03-03 DIAGNOSIS — T465X6A Underdosing of other antihypertensive drugs, initial encounter: Secondary | ICD-10-CM | POA: Diagnosis present

## 2017-03-03 DIAGNOSIS — F10239 Alcohol dependence with withdrawal, unspecified: Secondary | ICD-10-CM

## 2017-03-03 DIAGNOSIS — E876 Hypokalemia: Secondary | ICD-10-CM | POA: Diagnosis present

## 2017-03-03 DIAGNOSIS — T1490XA Injury, unspecified, initial encounter: Secondary | ICD-10-CM

## 2017-03-03 DIAGNOSIS — Z8673 Personal history of transient ischemic attack (TIA), and cerebral infarction without residual deficits: Secondary | ICD-10-CM

## 2017-03-03 DIAGNOSIS — F17210 Nicotine dependence, cigarettes, uncomplicated: Secondary | ICD-10-CM | POA: Diagnosis present

## 2017-03-03 DIAGNOSIS — Z9181 History of falling: Secondary | ICD-10-CM

## 2017-03-03 DIAGNOSIS — F251 Schizoaffective disorder, depressive type: Secondary | ICD-10-CM | POA: Diagnosis present

## 2017-03-03 DIAGNOSIS — Z7984 Long term (current) use of oral hypoglycemic drugs: Secondary | ICD-10-CM | POA: Diagnosis not present

## 2017-03-03 DIAGNOSIS — Z8659 Personal history of other mental and behavioral disorders: Secondary | ICD-10-CM

## 2017-03-03 DIAGNOSIS — E1165 Type 2 diabetes mellitus with hyperglycemia: Secondary | ICD-10-CM | POA: Diagnosis present

## 2017-03-03 DIAGNOSIS — T461X6A Underdosing of calcium-channel blockers, initial encounter: Secondary | ICD-10-CM | POA: Diagnosis present

## 2017-03-03 DIAGNOSIS — Z87898 Personal history of other specified conditions: Secondary | ICD-10-CM

## 2017-03-03 DIAGNOSIS — F1410 Cocaine abuse, uncomplicated: Secondary | ICD-10-CM | POA: Diagnosis present

## 2017-03-03 DIAGNOSIS — H538 Other visual disturbances: Secondary | ICD-10-CM | POA: Diagnosis present

## 2017-03-03 DIAGNOSIS — E785 Hyperlipidemia, unspecified: Secondary | ICD-10-CM | POA: Diagnosis present

## 2017-03-03 DIAGNOSIS — R000 Tachycardia, unspecified: Secondary | ICD-10-CM

## 2017-03-03 DIAGNOSIS — I10 Essential (primary) hypertension: Secondary | ICD-10-CM | POA: Diagnosis present

## 2017-03-03 LAB — URINALYSIS, ROUTINE W REFLEX MICROSCOPIC
BACTERIA UA: NONE SEEN
Bilirubin Urine: NEGATIVE
Glucose, UA: >=500 mg/dL — AB
HGB URINE DIPSTICK: NEGATIVE
Ketones, ur: NEGATIVE mg/dL
Leukocytes, UA: NEGATIVE
NITRITE: NEGATIVE
Protein, ur: 30 mg/dL — AB
SPECIFIC GRAVITY, URINE: 1.027 (ref 1.005–1.030)
Squamous Epithelial / LPF: NONE SEEN
pH: 5 (ref 5.0–8.0)

## 2017-03-03 LAB — BASIC METABOLIC PANEL
Anion gap: 13 (ref 5–15)
BUN: 17 mg/dL (ref 6–20)
CALCIUM: 8.3 mg/dL — AB (ref 8.9–10.3)
CO2: 21 mmol/L — AB (ref 22–32)
Chloride: 102 mmol/L (ref 101–111)
Creatinine, Ser: 1 mg/dL (ref 0.61–1.24)
GFR calc Af Amer: >60 mL/min (ref >60)
GFR calc non Af Amer: >60 mL/min (ref >60)
GLUCOSE: 472 mg/dL — AB (ref 65–99)
Potassium: 3.5 mmol/L (ref 3.5–5.1)
Sodium: 136 mmol/L (ref 135–145)

## 2017-03-03 LAB — RAPID URINE DRUG SCREEN, HOSP PERFORMED
AMPHETAMINES: NOT DETECTED
BARBITURATES: NOT DETECTED
Benzodiazepines: NOT DETECTED
COCAINE: POSITIVE — AB
OPIATES: NOT DETECTED
TETRAHYDROCANNABINOL: NOT DETECTED

## 2017-03-03 LAB — PROTIME-INR
INR: 0.95
Prothrombin Time: 12.7 seconds (ref 11.4–15.2)

## 2017-03-03 LAB — PHOSPHORUS: Phosphorus: 2.7 mg/dL (ref 2.5–4.6)

## 2017-03-03 LAB — CBC
HCT: 41.2 % (ref 39.0–52.0)
HEMOGLOBIN: 13.5 g/dL (ref 13.0–17.0)
MCH: 29.5 pg (ref 26.0–34.0)
MCHC: 32.8 g/dL (ref 30.0–36.0)
MCV: 90.2 fL (ref 78.0–100.0)
Platelets: 109 10*3/uL — ABNORMAL LOW (ref 150–400)
RBC: 4.57 MIL/uL (ref 4.22–5.81)
RDW: 13.9 % (ref 11.5–15.5)
WBC: 5.8 10*3/uL (ref 4.0–10.5)

## 2017-03-03 LAB — HEPATIC FUNCTION PANEL
ALBUMIN: 3.6 g/dL (ref 3.5–5.0)
ALT: 36 U/L (ref 17–63)
AST: 75 U/L — AB (ref 15–41)
Alkaline Phosphatase: 60 U/L (ref 38–126)
BILIRUBIN TOTAL: 0.7 mg/dL (ref 0.3–1.2)
Bilirubin, Direct: 0.2 mg/dL (ref 0.1–0.5)
Indirect Bilirubin: 0.5 mg/dL (ref 0.3–0.9)
TOTAL PROTEIN: 7.3 g/dL (ref 6.5–8.1)

## 2017-03-03 LAB — I-STAT TROPONIN, ED: Troponin i, poc: 0.01 ng/mL (ref 0.00–0.08)

## 2017-03-03 LAB — CBG MONITORING, ED
GLUCOSE-CAPILLARY: 332 mg/dL — AB (ref 65–99)
GLUCOSE-CAPILLARY: 115 mg/dL — AB (ref 65–99)
GLUCOSE-CAPILLARY: 173 mg/dL — AB (ref 65–99)

## 2017-03-03 LAB — GLUCOSE, CAPILLARY: Glucose-Capillary: 234 mg/dL — ABNORMAL HIGH (ref 65–99)

## 2017-03-03 LAB — TSH: TSH: 4.671 u[IU]/mL — ABNORMAL HIGH (ref 0.350–4.500)

## 2017-03-03 LAB — MRSA PCR SCREENING: MRSA by PCR: NEGATIVE

## 2017-03-03 LAB — MAGNESIUM: Magnesium: 1.5 mg/dL — ABNORMAL LOW (ref 1.7–2.4)

## 2017-03-03 LAB — ETHANOL: Alcohol, Ethyl (B): <5 mg/dL (ref <5)

## 2017-03-03 MED ORDER — CLONIDINE HCL 0.1 MG PO TABS
0.1000 mg | ORAL_TABLET | Freq: Two times a day (BID) | ORAL | Status: DC
  Administered 2017-03-03 – 2017-03-04 (×2): 0.1 mg via ORAL
  Filled 2017-03-03 (×2): qty 1

## 2017-03-03 MED ORDER — SODIUM CHLORIDE 0.9 % IV SOLN
1000.0000 mL | Freq: Once | INTRAVENOUS | Status: AC
  Administered 2017-03-03: 1000 mL via INTRAVENOUS

## 2017-03-03 MED ORDER — SODIUM CHLORIDE 0.9 % IV SOLN
1000.0000 mL | INTRAVENOUS | Status: DC
  Administered 2017-03-04 – 2017-03-06 (×3): 1000 mL via INTRAVENOUS

## 2017-03-03 MED ORDER — CLONIDINE HCL 0.1 MG PO TABS
0.1000 mg | ORAL_TABLET | Freq: Once | ORAL | Status: AC
  Administered 2017-03-03: 0.1 mg via ORAL
  Filled 2017-03-03: qty 1

## 2017-03-03 MED ORDER — SODIUM CHLORIDE 0.9 % IV SOLN: 1000.0000 mL | INTRAVENOUS | Status: DC

## 2017-03-03 MED ORDER — HYDRALAZINE HCL 20 MG/ML IJ SOLN
20.0000 mg | Freq: Once | INTRAMUSCULAR | Status: AC
  Administered 2017-03-03: 20 mg via INTRAVENOUS
  Filled 2017-03-03: qty 1

## 2017-03-03 MED ORDER — VITAMIN B-1 100 MG PO TABS
100.0000 mg | ORAL_TABLET | Freq: Every day | ORAL | Status: DC
  Administered 2017-03-04 – 2017-03-06 (×3): 100 mg via ORAL
  Filled 2017-03-03 (×3): qty 1

## 2017-03-03 MED ORDER — METFORMIN HCL 500 MG PO TABS
1000.0000 mg | ORAL_TABLET | Freq: Once | ORAL | Status: AC
  Administered 2017-03-03: 1000 mg via ORAL
  Filled 2017-03-03: qty 2

## 2017-03-03 MED ORDER — INSULIN ASPART 100 UNIT/ML ~~LOC~~ SOLN
10.0000 [IU] | Freq: Once | SUBCUTANEOUS | Status: AC
  Administered 2017-03-03: 10 [IU] via INTRAVENOUS
  Filled 2017-03-03: qty 1

## 2017-03-03 MED ORDER — HYDRALAZINE HCL 25 MG PO TABS
50.0000 mg | ORAL_TABLET | Freq: Once | ORAL | Status: AC
  Administered 2017-03-03: 50 mg via ORAL
  Filled 2017-03-03: qty 2

## 2017-03-03 MED ORDER — LORAZEPAM 2 MG/ML IJ SOLN
2.0000 mg | Freq: Once | INTRAMUSCULAR | Status: AC
  Administered 2017-03-03: 2 mg via INTRAVENOUS
  Filled 2017-03-03: qty 1

## 2017-03-03 MED ORDER — ACETAMINOPHEN 500 MG PO TABS
1000.0000 mg | ORAL_TABLET | Freq: Once | ORAL | Status: AC
  Administered 2017-03-03: 1000 mg via ORAL
  Filled 2017-03-03: qty 2

## 2017-03-03 MED ORDER — AMLODIPINE BESYLATE 10 MG PO TABS
10.0000 mg | ORAL_TABLET | Freq: Every day | ORAL | Status: DC
  Administered 2017-03-04 – 2017-03-06 (×3): 10 mg via ORAL
  Filled 2017-03-03 (×3): qty 1

## 2017-03-03 MED ORDER — HYDRALAZINE HCL 20 MG/ML IJ SOLN
5.0000 mg | INTRAMUSCULAR | Status: DC | PRN
  Administered 2017-03-04: 5 mg via INTRAVENOUS
  Filled 2017-03-03: qty 1

## 2017-03-03 MED ORDER — HYDROCHLOROTHIAZIDE 25 MG PO TABS
25.0000 mg | ORAL_TABLET | Freq: Every day | ORAL | Status: DC
  Administered 2017-03-04 – 2017-03-06 (×3): 25 mg via ORAL
  Filled 2017-03-03 (×3): qty 1

## 2017-03-03 MED ORDER — LORAZEPAM 2 MG/ML IJ SOLN
2.0000 mg | INTRAMUSCULAR | Status: DC | PRN
  Administered 2017-03-04: 2 mg via INTRAVENOUS
  Filled 2017-03-03: qty 1

## 2017-03-03 MED ORDER — FOLIC ACID 1 MG PO TABS
1.0000 mg | ORAL_TABLET | Freq: Every day | ORAL | Status: DC
  Administered 2017-03-04 – 2017-03-06 (×3): 1 mg via ORAL
  Filled 2017-03-03 (×3): qty 1

## 2017-03-03 MED ORDER — LORAZEPAM 2 MG/ML IJ SOLN
1.0000 mg | Freq: Once | INTRAMUSCULAR | Status: AC
  Administered 2017-03-03: 1 mg via INTRAVENOUS
  Filled 2017-03-03: qty 1

## 2017-03-03 MED ORDER — SODIUM CHLORIDE 0.9 % IV SOLN
Freq: Once | INTRAVENOUS | Status: AC
  Administered 2017-03-03: 15:00:00 via INTRAVENOUS

## 2017-03-03 MED ORDER — INSULIN ASPART 100 UNIT/ML ~~LOC~~ SOLN
0.0000 [IU] | Freq: Three times a day (TID) | SUBCUTANEOUS | Status: DC
  Administered 2017-03-04: 5 [IU] via SUBCUTANEOUS
  Administered 2017-03-04: 3 [IU] via SUBCUTANEOUS
  Administered 2017-03-04: 2 [IU] via SUBCUTANEOUS
  Administered 2017-03-05: 3 [IU] via SUBCUTANEOUS
  Administered 2017-03-05: 5 [IU] via SUBCUTANEOUS
  Administered 2017-03-05: 3 [IU] via SUBCUTANEOUS
  Administered 2017-03-06: 2 [IU] via SUBCUTANEOUS
  Administered 2017-03-06: 3 [IU] via SUBCUTANEOUS

## 2017-03-03 MED ORDER — ENALAPRILAT 1.25 MG/ML IV SOLN: 0.6250 mg | Freq: Once | INTRAVENOUS | Status: DC

## 2017-03-03 MED ORDER — ENALAPRILAT 1.25 MG/ML IV SOLN
0.6250 mg | Freq: Once | INTRAVENOUS | Status: AC
  Administered 2017-03-03: 0.625 mg via INTRAVENOUS
  Filled 2017-03-03: qty 1

## 2017-03-03 MED ORDER — LORAZEPAM 2 MG/ML IJ SOLN
2.0000 mg | INTRAMUSCULAR | Status: DC
  Administered 2017-03-03: 2 mg via INTRAVENOUS
  Filled 2017-03-03: qty 1

## 2017-03-03 MED ORDER — M.V.I. ADULT IV INJ
INJECTION | Freq: Once | INTRAVENOUS | Status: AC
  Administered 2017-03-03: 18:00:00 via INTRAVENOUS
  Filled 2017-03-03: qty 1000

## 2017-03-03 MED ORDER — ADULT MULTIVITAMIN W/MINERALS CH
1.0000 | ORAL_TABLET | Freq: Every day | ORAL | Status: DC
  Administered 2017-03-04 – 2017-03-06 (×3): 1 via ORAL
  Filled 2017-03-03 (×3): qty 1

## 2017-03-03 MED ORDER — SODIUM CHLORIDE 0.9% FLUSH
3.0000 mL | Freq: Two times a day (BID) | INTRAVENOUS | Status: DC
  Administered 2017-03-04 – 2017-03-05 (×2): 3 mL via INTRAVENOUS

## 2017-03-03 MED ORDER — AMLODIPINE BESYLATE 5 MG PO TABS
10.0000 mg | ORAL_TABLET | Freq: Once | ORAL | Status: AC
  Administered 2017-03-03: 10 mg via ORAL
  Filled 2017-03-03: qty 2

## 2017-03-03 NOTE — H&P (Signed)
[name] Admission History and Physical Service Pager: [phone]  Patient name: [name] Medical record number: [id number] Date of birth: [birth year] Age: 56 y.o. Gender: male  Primary Care Provider: Jinny Brown Total [facility] Care Consultants: None Code Status: Full  Chief Complaint: Generalized Weakness  Assessment and Plan: [name] is a 56 y.o. male presenting with generalized weakness found to have resistant hypertension and hyperglycemia. PMH is significant for T2DM, EtOH use with history of DTs, polysubstance abuse (cocaine), history of CVA, schizoaffective disorder, HLD  Hypertension, acute on chronic, severe  - BP elevated up to 208/128 in the ED, endorsing headache and blurry vision today. HTN resistant to hydralazine PO and IV, clonidine, and norvasc given in ED.  Uncertain etiology, however appears to be chronically hypertensive per chart review. Likely multifactorial, due to poor medication compliance and a component of alochol withdrawal  and cocaine use. Creatinine was stable at 1.0, though UA notable for 30 protein, hyaline casts.  EKG without signs of ischemia. Home regmin norvasc 5 mg daily, HCTZ 25 mg QD, clonidine 0.1 mg BID - Admit to stepdown, FPTS attending Dr. Andria Brown - Restart home meds with inc dose norvasc 10 mg, home dose HCTZ and clonidine - PRN hydralazine SBP > 180 DBP >110 - CT head non-contrast  - check TSH, Mag, Phos - vitals per unit - repeat EKG (due to tachycardia)  Generalized Weakness, HA, history of falls: Neuro exam is non-focal. Poor historian and unable to go into detail about falls. Denies head trauma. Likely multifactorial: alcohol abuse/withdrawal, elevated BP.  - CT head   Diabetes, uncontrolled  - Hyperglycemic to 472 on admission. Given 10 units of novolog and metformin in the ED. Home regimen metformin 500 BID. Last A1c 7.6 (08/2015) -  Hold home metformin - sensitive sliding scale  insulin, titrate/add coverage as appropriate - monitor CBGs - check A1c  Alcoholism with history of Delirium Tremens - withdrawal and DT history noted per chart review, patient is uncertain when this occurred. Last drink was 12 hours prior to admission per patient. LFT's consistent with chronic alcohol abuse. - monitor on CIWA, likely will need Ativan - monitor in stepdown - will likely need scheduled ativan (will monitor first)  - mag, phos  Substance Abuse - UDS notable for cocaine - avoid beta blockers other than labetalol given risk for unopposed alpha-receptor activity with recent cocaine use - cessation counseling  HLD/Hx stroke (2013)- no lipid panel available. No focal neurologic signs on exam. No statin or ASA on med reconciliation. - no focal neurologic deficits, no suspicion for acute stroke - head CT as above - consider adding statin, ASA after this acute illness   Thrombocytopenia: History of thrombocytopenia. no signs of bleeding  - monitor  FEN/GI: Heart/Carb modified, no IV fluids, maintain IV access Prophylaxis: SCDs  Disposition: Admit to stepdown for resistant HTN and tachycardia and alcohol withdrawal   History of Present Illness: History was obtained from patient, however it may be unreliable given that he frequently adjusted his responses to questions and spoke with gargled speech, was mildly disoriented and claimed he was "high" from the ED medications during the interview.   [name] is a 56 y.o. male presenting with generalized weakness for the past day, found to have resistant HTN in the ED.  He was in his usual state of health until last night when he drank 3 40oz beers and used crack cocaine. He endorses regular use of alcohol and cocaine. He endorses  having a headache all day, and reports he frequently has headaches when he drinks. He states this headache is different from his usual headache but unable to say how it is different. Endorses blurry  vision earlier today on the bus, now resolved. Reports he last took norvasc about 3 weeks ago. Unable to recall his other medications, unsure when he last took his metformin. Denies chest pain, palpitations or dyspnea. Denies N/V/D, some hard stools, no polyuria, no focal neurologic deficits.  Reports he has been falling down frequently lately but does not think he has hit his head. Unsure if he has had seizures from withdrawal.  Patient states he is living in a "treatment center" for the past three days, though he is unsure what this center is called. States he went off the property last night to drink alcohol and use cocaine because he was not feeling well.  Review Of Systems: Per HPI.  ROS  Patient Active Problem List   Diagnosis Date Noted  . Cocaine abuse with cocaine-induced mood disorder ([place]) 12/05/2016  . Cerebrovascular disease 09/02/2015  . 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 Malignant hypertension 08/30/2015  . Alcohol use disorder, severe, dependence ([place]) 08/30/2015  . Alcohol withdrawal ([place]) 08/30/2015  . 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 Thrombocytopenia ([place]) 08/27/2015  . Tobacco use disorder 08/26/2015  . Hyperlipidemia 04/28/2015  . Schizoaffective disorder, depressive type ([place])   . Gout 08/09/2013  . Diabetes mellitus ([place]) 07/09/2012  . Polysubstance abuse 07/09/2012    Past Medical History: Past Medical History:  Diagnosis Date  . Alcohol abuse   . Arthritis   . Crack cocaine use   . Depression   . Diabetes mellitus   . Drug abuse   . DTs (delirium tremens) ([place])    history of   . Gout   . Noncompliance with medication regimen   . Schizophrenia ([place])   . Stroke ([place])   . Uncontrolled hypertension     Past Surgical History: Past Surgical History:  Procedure Laterality Date  . MANDIBLE RECONSTRUCTION      Social  History: Social History  Substance Use Topics  . Smoking status: Current Every Day Smoker    Packs/day: 1.0    Types: Cigarettes  . Smokeless tobacco: Never Used  . Alcohol use 0.6 - 1.2 oz/week    1 - 2 Cans of beer per week     Comment: [facility]   Additional social history:  ~31 pack/year smoking history Please also refer to relevant sections of EMR.   Family History: Family History  Problem Relation Age of Onset  . Hypertension      Allergies and Medications: No Known Allergies No current facility-administered medications on file prior to encounter.    Current Outpatient Prescriptions on File Prior to Encounter  Medication Sig Dispense Refill  . amLODipine (NORVASC) 5 MG tablet Take 1 tablet (5 mg total) by mouth daily. 30 tablet 0  . cloNIDine (CATAPRES) 0.1 MG tablet Take 1 tablet (0.1 mg total) by mouth 2 (two) times daily. 60 tablet 0  . DULoxetine (CYMBALTA) 30 MG capsule Take 1 capsule (30 mg total) by mouth 2 (two) times daily. 60 capsule 0  . hydrochlorothiazide (HYDRODIURIL) 25 MG tablet Take 1 tablet (25 mg total) by mouth daily. 30 tablet 0  . hydrOXYzine (ATARAX/VISTARIL) 25 MG tablet Take 1 tablet (25 mg total) by mouth 3 (three) times daily as needed  for anxiety. 30 tablet 0  . metFORMIN (GLUCOPHAGE) 500 MG tablet Take 1 tablet (500 mg total) by mouth 2 (two) times daily with a meal. 60 tablet 0  . paliperidone (INVEGA SUSTENNA) 156 MG/ML SUSP injection Inject 156 mg into the muscle every 30 (thirty) days.      Objective: BP (!) 160/101   Pulse (!) 128   Temp 98.7 F (37.1 C) (Oral)   Resp (!) 37   SpO2 96%  Exam: General: Rests in bed, NAD, cooperative Eyes: PERRL, EOMI, no scleral icterus, no conjunctival pallor or injection ENTM: no rhinorrhea, oropharynx clear Neck: no thyromegaly or LAD Cardiovascular: +tachycardic, regular, no m/r/g Respiratory: CTA bil, no W/R/R Gastrointestinal: soft, no hepatosplenomegaly, nondistended MSK: moves 4  extremities equally Derm: no rashes or lesions Neuro: CN II-XII grossly intact, 5/5 strength in all extremities, no dysmetria, no asterixis Psych: oriented to person and place, states it is [facility] [place] and Imaging: CBC BMET   Recent Labs Lab 03/03/17 1008  WBC 5.8  HGB 13.5  HCT 41.2  PLT 109*    Recent Labs Lab 03/03/17 1008  NA 136  K 3.5  CL 102  CO2 21*  BUN 17  CREATININE 1.00  GLUCOSE 472*  CALCIUM 8.3*     UDS - cocaine UA - Glucose >500, protein 30, hyaline casts CMP - AST 75, ALT 36, Alkp P 60  Everrett Coombe, MD 03/03/2017, 5:04 PM PGY-1, [place] Intern pager: [phone], text pages welcome  UPPER LEVEL ADDENDUM  I have read the above note and made revisions highlighted in blue.  Smiley Houseman, MD PGY-2 [facility] Pager [phone]

## 2017-03-03 NOTE — Progress Notes (Signed)
Patient arrived to 3W20 from ed. With telesitter at bedside. Bed in the lowest position with floor mat on left side of bed. Call light within reach. Frequent rounding. Will continue to monitor.

## 2017-03-03 NOTE — ED Notes (Signed)
Fall alarm placed under patient.

## 2017-03-03 NOTE — ED Notes (Signed)
Chrissie Noa: [phone] Call when pt is ready to be d/c.

## 2017-03-03 NOTE — ED Triage Notes (Signed)
Pt arrives to the ED with a c/o of all over weakness. Pt states for the last 2 weeks he has felt weak and tired and had frequent falls. Pt reports daily alcohol use and drug use. Pt reports using crack cocaine yesterday. Pt denies any pain. Pt last drank alcohol last night.

## 2017-03-03 NOTE — Progress Notes (Signed)
I saw and examined [name].  Discussed with Drs Burr Medico and Dallas Schimke.  I will co sign the H&PE when available.  Briefly.  [name] has lived a hard life with many poor choices.  He has chronic DM and HBP for which he is non compliant.  He also continues to abuse alcohol and cocaine.  He is in the ER today with weakness, confusion and marked BP elevation.  Urine is once again positive for cocaine.  I conceptualize him as primarily cocaine intoxication and alcohol withdrawal.  I would push the dose of ativan as my first line treatment.  Restart his home BP meds and augment with prn hydralyzine.  Avoid beta blockers.  Sliding scale insulin.  I hope once he is medically stable, he will make better life choices.

## 2017-03-03 NOTE — ED Provider Notes (Signed)
[facility] DEPT Provider Note   CSN: [id number] Arrival date & time: 03/03/17  5366     History   Chief Complaint Chief Complaint  Patient presents with  . Weakness    HPI [name] is a 56 y.o. male.  HPI Patient reports he is feeling generally weak. He states that he has cut back on his drinking. Last alcohol was yesterday evening.He reports drinking typically about 9 beers a day. His friend advises that he had been on a crack cocaine binge that ended about 9 days ago. His friend thinks his body is just readjusting from the binge drug abuse. Patient denies any vomiting. He reports however his appetite has been poor for a while. He denies chest pain or shortness of breath. He endorses frontal pressure headache. He reports sometimes his vision is slightly blurry. He denies focal weakness or extremity dysfunction. His feeling of weakness is more generalized. He reports he has fallen occasionally over the past several weeks. He also has been out of his medications for some time. He reports they give him medicine packs at [name]. He denies a PCP. He is planning to go see Dr.OseiBonsu but has not gone to the office yet. He has not taken any of his blood pressure medications because they've been lost. He reports he takes some metformin occasionally but he has missed doses. Past Medical History:  Diagnosis Date  . Alcohol abuse   . Arthritis   . Crack cocaine use   . Depression   . Diabetes mellitus   . Drug abuse   . DTs (delirium tremens) ([place])    history of   . Gout   . Noncompliance with medication regimen   . Schizophrenia ([place])   . Stroke ([place])   . Uncontrolled hypertension     Patient Active Problem List   Diagnosis Date Noted  . Cocaine abuse with cocaine-induced mood disorder ([place]) 12/05/2016  . Cerebrovascular disease 09/02/2015  . 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 Malignant hypertension 08/30/2015    . Alcohol use disorder, severe, dependence ([place]) 08/30/2015  . Alcohol withdrawal ([place]) 08/30/2015  . 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 Thrombocytopenia ([place]) 08/27/2015  . Tobacco use disorder 08/26/2015  . Hyperlipidemia 04/28/2015  . Schizoaffective disorder, depressive type ([place])   . Gout 08/09/2013  . Diabetes mellitus ([place]) 07/09/2012  . Polysubstance abuse 07/09/2012    Past Surgical History:  Procedure Laterality Date  . MANDIBLE RECONSTRUCTION         Home Medications    Prior to Admission medications   Medication Sig Start Date End Date Taking? Authorizing Provider  acetaminophen (TYLENOL) 325 MG tablet Take 650 mg by mouth every 6 (six) hours as needed for mild pain or headache.    Historical Provider, MD  amLODipine (NORVASC) 5 MG tablet Take 1 tablet (5 mg total) by mouth daily. 01/09/17   Patrecia Pour, NP  cloNIDine (CATAPRES) 0.1 MG tablet Take 1 tablet (0.1 mg total) by mouth 2 (two) times daily. 01/09/17   Patrecia Pour, NP  DULoxetine (CYMBALTA) 30 MG capsule Take 1 capsule (30 mg total) by mouth 2 (two) times daily. 01/09/17   Patrecia Pour, NP  hydrochlorothiazide (HYDRODIURIL) 25 MG tablet Take 1 tablet (25 mg total) by mouth daily. 01/09/17   Patrecia Pour, NP  hydrOXYzine (ATARAX/VISTARIL) 25 MG tablet Take 1 tablet (25 mg total) by mouth 3 (three) times  daily as needed for anxiety. 01/09/17   Patrecia Pour, NP  metFORMIN (GLUCOPHAGE) 500 MG tablet Take 1 tablet (500 mg total) by mouth 2 (two) times daily with a meal. 01/09/17   Patrecia Pour, NP  paliperidone (INVEGA SUSTENNA) 156 MG/ML SUSP injection Inject 156 mg into the muscle every 30 (thirty) days.    Historical Provider, MD    Family History Family History  Problem Relation Age of Onset  . Hypertension      Social History Social History  Substance Use Topics  . Smoking status: Current Every Day Smoker    Packs/day:  0.25    Types: Cigarettes  . Smokeless tobacco: Never Used  . Alcohol use 0.6 - 1.2 oz/week    1 - 2 Cans of beer per week     Comment: [facility]     Allergies   Patient has no known allergies.   Review of Systems Review of Systems  All other systems reviewed and are negative.    Physical Exam Updated Vital Signs BP (!) 187/126   Pulse 83   Temp 98.7 F (37.1 C) (Oral)   Resp (!) 24   SpO2 99%   Physical Exam  Constitutional: He is oriented to person, place, and time. He appears well-developed and well-nourished. No distress.  Patient is alert and nontoxic. His mental status is clear. He is cooperative and interactive. No respiratory distress.  HENT:  Head: Normocephalic and atraumatic.  Mouth/Throat: Oropharynx is clear and moist.  Eyes: Pupils are equal, round, and reactive to light.  Neck: Neck supple.  Cardiovascular: Normal rate, regular rhythm, normal heart sounds and intact distal pulses.   Pulmonary/Chest: Effort normal and breath sounds normal.  Abdominal: Soft. He exhibits no distension. There is no tenderness. There is no guarding.  Musculoskeletal: Normal range of motion. He exhibits no edema or tenderness.  Neurological: He is alert and oriented to person, place, and time. No cranial nerve deficit. He exhibits normal muscle tone. Coordination normal.  Skin: Skin is warm and dry.  Psychiatric: He has a normal mood and affect.     ED Treatments / Results  Labs (all labs ordered are listed, but only abnormal results are displayed) Labs Reviewed  BASIC METABOLIC PANEL - Abnormal; Notable for the following:       Result Value   CO2 21 (*)    Glucose, Bld 472 (*)    Calcium 8.3 (*)    All other components within normal limits  CBC - Abnormal; Notable for the following:    Platelets 109 (*)    All other components within normal limits  URINALYSIS, ROUTINE W REFLEX MICROSCOPIC - Abnormal; Notable for the following:    Glucose, UA >=500 (*)     Protein, ur 30 (*)    All other components within normal limits  HEPATIC FUNCTION PANEL - Abnormal; Notable for the following:    AST 75 (*)    All other components within normal limits  RAPID URINE DRUG SCREEN, HOSP PERFORMED - Abnormal; Notable for the following:    Cocaine POSITIVE (*)    All other components within normal limits  CBG MONITORING, ED - Abnormal; Notable for the following:    Glucose-Capillary 332 (*)    All other components within normal limits  ETHANOL  I-STAT TROPOININ, ED    EKG  EKG Interpretation  Date/Time:  Monday March 03 2017 12:42:07 EDT Ventricular Rate:  86 PR Interval:    QRS Duration: 99 QT  Interval:  362 QTC Calculation: 433 R Axis:   -34 Text Interpretation:  Sinus rhythm LAE, consider biatrial enlargement Left ventricular hypertrophy ST elevation, consider anterior injury no change from previous Confirmed by Johnney Killian, MD, Jeannie Done [phone]) on 03/03/2017 2:23:01 PM       Radiology No results found.  Procedures Procedures (including critical care time) CRITICAL CARE Performed by: Charlesetta Shanks   Total critical care time: 45 minutes  Critical care time was exclusive of separately billable procedures and treating other patients.  Critical care was necessary to treat or prevent imminent or life-threatening deterioration.  Critical care was time spent personally by me on the following activities: development of treatment plan with patient and/or surrogate as well as nursing, discussions with consultants, evaluation of patient's response to treatment, examination of patient, obtaining history from patient or surrogate, ordering and performing treatments and interventions, ordering and review of laboratory studies, ordering and review of radiographic studies, pulse oximetry and re-evaluation of patient's condition. Medications Ordered in ED Medications  amLODipine (NORVASC) tablet 10 mg (10 mg Oral Given 03/03/17 1237)  cloNIDine (CATAPRES)  tablet 0.1 mg (0.1 mg Oral Given 03/03/17 1237)  metFORMIN (GLUCOPHAGE) tablet 1,000 mg (1,000 mg Oral Given 03/03/17 1237)  hydrALAZINE (APRESOLINE) tablet 50 mg (50 mg Oral Given 03/03/17 1414)  cloNIDine (CATAPRES) tablet 0.1 mg (0.1 mg Oral Given 03/03/17 1414)  0.9 %  sodium chloride infusion ( Intravenous New Bag/Given 03/03/17 1450)  insulin aspart (novoLOG) injection 10 Units (10 Units Intravenous Given 03/03/17 1451)  hydrALAZINE (APRESOLINE) injection 20 mg (20 mg Intravenous Given 03/03/17 1451)  LORazepam (ATIVAN) injection 2 mg (2 mg Intravenous Given 03/03/17 1451)     Initial Impression / Assessment and Plan / ED Course  I have reviewed the triage vital signs and the nursing notes.  Pertinent labs & imaging results that were available during my care of the patient were reviewed by me and considered in my medical decision making (see chart for details).    Consult: Family medicine for admission Consult: (17:00) Dr. Oletta Darter intensivist. Reviewed patient's presentation, blood pressure, alcohol use and signs of likely withdrawal. At this time, patient's blood pressure is appropriate range at 160s over 100. We'll continue to treat with Ativan. If patient becomes significantly agitated will consider ICU placement however at this time Dr. Oletta Darter recommends stepdown placement on family medicine service with continued treatment of withdrawal and hypertension.  Final Clinical Impressions(s) / ED Diagnoses   Final diagnoses:  Hypertensive urgency  Alcohol abuse  Cocaine abuse  Patient presents with history of alcohol and cocaine abuse. He presents with complaint of general weakness. Upon presentation he is alert and appropriate. Mental status is clear. Did not have acute agitation. He however did have hypertensive urgency with diastolic blood pressure consistently greater than 120. Treatment was initiated in the emergency department. Blood pressure has responded appropriately over time and now  is at 160/100. Gradually however patient has become more agitated and likely etiology is alcohol withdrawal. Patient is a daily heavy alcohol consumer as well as cocaine abuser. He is being treated with Ativan in 2 mg increments. He will require ongoing close observation and monitoring for alcohol withdrawal. This will be as per discussed with family medicine and Dr. Oletta Darter. Patient however remains intact neurologically. He and sit up at the end of the bed. Using all 4 extremities appropriately. He is developing some hand tremor.  New Prescriptions New Prescriptions   No medications on file     Charlesetta Shanks,  MD 03/03/17 1708

## 2017-03-03 NOTE — ED Notes (Signed)
Pt noted to have sudden onset of confusion along with increasing HR. Pt diaphoretic. MD aware of changes.

## 2017-03-03 NOTE — ED Notes (Signed)
Pts IV secured with koban and tape, pt was attempting to remove IV from hand.

## 2017-03-04 DIAGNOSIS — F1410 Cocaine abuse, uncomplicated: Secondary | ICD-10-CM

## 2017-03-04 LAB — CBC
HEMATOCRIT: 38.2 % — AB (ref 39.0–52.0)
Hemoglobin: 12.5 g/dL — ABNORMAL LOW (ref 13.0–17.0)
MCH: 29.2 pg (ref 26.0–34.0)
MCHC: 32.7 g/dL (ref 30.0–36.0)
MCV: 89.3 fL (ref 78.0–100.0)
Platelets: 109 10*3/uL — ABNORMAL LOW (ref 150–400)
RBC: 4.28 MIL/uL (ref 4.22–5.81)
RDW: 14 % (ref 11.5–15.5)
WBC: 6 10*3/uL (ref 4.0–10.5)

## 2017-03-04 LAB — COMPREHENSIVE METABOLIC PANEL
ALBUMIN: 3.2 g/dL — AB (ref 3.5–5.0)
ALT: 34 U/L (ref 17–63)
AST: 70 U/L — AB (ref 15–41)
Alkaline Phosphatase: 49 U/L (ref 38–126)
Anion gap: 9 (ref 5–15)
BUN: 12 mg/dL (ref 6–20)
CHLORIDE: 103 mmol/L (ref 101–111)
CO2: 24 mmol/L (ref 22–32)
Calcium: 8.1 mg/dL — ABNORMAL LOW (ref 8.9–10.3)
Creatinine, Ser: 0.79 mg/dL (ref 0.61–1.24)
GFR calc Af Amer: >60 mL/min (ref >60)
GLUCOSE: 199 mg/dL — AB (ref 65–99)
POTASSIUM: 3.1 mmol/L — AB (ref 3.5–5.1)
Sodium: 136 mmol/L (ref 135–145)
Total Bilirubin: 0.9 mg/dL (ref 0.3–1.2)
Total Protein: 6.4 g/dL — ABNORMAL LOW (ref 6.5–8.1)

## 2017-03-04 LAB — HIV ANTIBODY (ROUTINE TESTING W REFLEX): HIV SCREEN 4TH GENERATION: NONREACTIVE

## 2017-03-04 LAB — GLUCOSE, CAPILLARY
GLUCOSE-CAPILLARY: 262 mg/dL — AB (ref 65–99)
Glucose-Capillary: 223 mg/dL — ABNORMAL HIGH (ref 65–99)
Glucose-Capillary: 196 mg/dL — ABNORMAL HIGH (ref 65–99)
Glucose-Capillary: 235 mg/dL — ABNORMAL HIGH (ref 65–99)

## 2017-03-04 MED ORDER — INSULIN GLARGINE 100 UNIT/ML ~~LOC~~ SOLN: 2.0000 [IU] | Freq: Every day | SUBCUTANEOUS | Status: DC

## 2017-03-04 MED ORDER — ACETAMINOPHEN 325 MG PO TABS
650.0000 mg | ORAL_TABLET | Freq: Once | ORAL | Status: AC
  Administered 2017-03-04: 650 mg via ORAL
  Filled 2017-03-04: qty 2

## 2017-03-04 MED ORDER — MAGNESIUM SULFATE IN D5W 1-5 GM/100ML-% IV SOLN
1.0000 g | Freq: Once | INTRAVENOUS | Status: AC
  Administered 2017-03-04: 1 g via INTRAVENOUS
  Filled 2017-03-04: qty 100

## 2017-03-04 MED ORDER — HYDRALAZINE HCL 20 MG/ML IJ SOLN
5.0000 mg | Freq: Four times a day (QID) | INTRAMUSCULAR | Status: DC | PRN
  Administered 2017-03-05: 5 mg via INTRAVENOUS
  Filled 2017-03-04: qty 1

## 2017-03-04 MED ORDER — POTASSIUM CHLORIDE CRYS ER 20 MEQ PO TBCR
40.0000 meq | EXTENDED_RELEASE_TABLET | Freq: Two times a day (BID) | ORAL | Status: AC
  Administered 2017-03-04 (×2): 40 meq via ORAL
  Filled 2017-03-04 (×2): qty 2

## 2017-03-04 MED ORDER — ACETAMINOPHEN 325 MG PO TABS
650.0000 mg | ORAL_TABLET | Freq: Four times a day (QID) | ORAL | Status: DC | PRN
  Administered 2017-03-04 – 2017-03-06 (×4): 650 mg via ORAL
  Filled 2017-03-04 (×4): qty 2

## 2017-03-04 MED ORDER — INSULIN GLARGINE 100 UNIT/ML ~~LOC~~ SOLN
2.0000 [IU] | Freq: Every day | SUBCUTANEOUS | Status: DC
  Administered 2017-03-04: 2 [IU] via SUBCUTANEOUS
  Filled 2017-03-04 (×3): qty 0.02

## 2017-03-04 NOTE — Evaluation (Signed)
Physical Therapy Evaluation Patient Details Name: [name] MRN: [id number] DOB: 1961/10/11 Today's Date: 03/04/2017   History of Present Illness  [name] is a 56 y.o. male presenting with generalized weakness found to have resistant hypertension and hyperglycemia. PMH is significant for T2DM (Last A1c 7.6 in 08/2015), EtOH use with history of DTs, polysubstance abuse (cocaine), history of CVA, schizoaffective disorder, HLD  Clinical Impression  Patient presents with decreased independence with mobility due to deficits listed in PT problem list.  He will benefit from skilled PT in the acute setting to allow safe d/c home with intermittent assist of friends.  Feel he will need RW for home and would benefit from PT follow up if able to get charity care.     Follow Up Recommendations Supervision - Intermittent;Home health PT    Equipment Recommendations  Rolling walker with 5" wheels    Recommendations for Other Services       Precautions / Restrictions Precautions Precautions: Fall Precaution Comments: watch BP      Mobility  Bed Mobility Overal bed mobility: Needs Assistance Bed Mobility: Supine to Sit     Supine to sit: Supervision;HOB elevated     General bed mobility comments: assist for safety with lines  Transfers Overall transfer level: Needs assistance   Transfers: Sit to/from Stand Sit to Stand: Supervision;Min guard         General transfer comment: slow to rise, reliant on UE's for push off, multiple attempts for success, but no real physical help given  Ambulation/Gait Ambulation/Gait assistance: Min assist;Min guard Ambulation Distance (Feet): 25 Feet Assistive device: 1 person hand held assist;Rolling walker (2 wheeled) Gait Pattern/deviations: Step-to pattern;Shuffle;Decreased stride length;Wide base of support   Gait velocity interpretation: <1.8 ft/sec, indicative of risk for recurrent falls General Gait Details: short  shuffling steps, initially with HHA, then with RW, pt reported legs giving out so did not go farther than just outside his door, cues for safety with turns and increased time  Stairs            Wheelchair Mobility    Modified Rankin (Stroke Patients Only)       Balance Overall balance assessment: Needs assistance   Sitting balance-Leahy Scale: Good       Standing balance-Leahy Scale: Fair Standing balance comment: able to stand statically and up from EOB without support, but needed HHA at least for ambulation                             Pertinent Vitals/Pain Pain Assessment: 0-10 Pain Score: 8  Pain Location: headache Pain Descriptors / Indicators: Headache Pain Intervention(s): Monitored during session;Repositioned;Patient requesting pain meds-RN notified    Home Living Family/patient expects to be discharged to:: Private residence Living Arrangements: Non-relatives/Friends Available Help at Discharge: Friend(s) Type of Home: House Home Access: Stairs to enter Entrance Stairs-Rails: None Technical brewer of Steps: 6 Home Layout: One level Home Equipment: None      Prior Function Level of Independence: Independent               Hand Dominance        Extremity/Trunk Assessment   Upper Extremity Assessment Upper Extremity Assessment: Generalized weakness    Lower Extremity Assessment Lower Extremity Assessment: Generalized weakness       Communication   Communication: No difficulties  Cognition Arousal/Alertness: Awake/alert Behavior During Therapy: Flat affect Overall Cognitive Status: Within Functional Limits for tasks assessed  General Comments      Exercises     Assessment/Plan    PT Assessment Patient needs continued PT services  PT Problem List Decreased strength;Decreased mobility;Decreased coordination;Decreased activity tolerance;Decreased  balance;Decreased knowledge of use of DME;Cardiopulmonary status limiting activity       PT Treatment Interventions DME instruction;Gait training;Therapeutic exercise;Patient/family education;Therapeutic activities;Stair training;Balance training;Functional mobility training    PT Goals (Current goals can be found in the Care Plan section)  Acute Rehab PT Goals Patient Stated Goal: To return home PT Goal Formulation: With patient Time For Goal Achievement: 03/11/17 Potential to Achieve Goals: Fair    Frequency Min 3X/week   Barriers to discharge Decreased caregiver support lives with friends who he states can help, but likely not 24/7 or real reliable for providing A for OOB actvties    Co-evaluation               End of Session Equipment Utilized During Treatment: Gait belt Activity Tolerance: Patient limited by fatigue Patient left: in chair;with call bell/phone within reach;with chair alarm set   PT Visit Diagnosis: Unsteadiness on feet (R26.81);Other abnormalities of gait and mobility (R26.89);Muscle weakness (generalized) (M62.81)    Time: 3893-7342 PT Time Calculation (min) (ACUTE ONLY): 27 min   Charges:   PT Evaluation $PT Eval Moderate Complexity: 1 Procedure PT Treatments $Gait Training: 8-22 mins   PT G Codes:   PT G-Codes **NOT FOR INPATIENT CLASS** Functional Assessment Tool Used: AM-PAC 6 Clicks Basic Mobility Functional Limitation: Mobility: Walking and moving around Mobility: Walking and Moving Around Current Status (A7681): At least 20 percent but less than 40 percent impaired, limited or restricted Mobility: Walking and Moving Around Goal Status [id number]): At least 1 percent but less than 20 percent impaired, limited or restricted    [place], [place] 03/04/2017   Reginia Naas 03/04/2017, 10:24 AM

## 2017-03-04 NOTE — Care Management Note (Addendum)
Case Management Note  Patient Details  Name: [name] MRN: [id number] Date of Birth: July 08, 1961  Subjective/Objective:    Pt presented for increased BP, ETOH and Cocaine Abuse. CIWA protocol.                 Action/Plan: Pt will not have a qualifying diagnosis for HHPT Services. CM will see as it nears d/c if he needs [facility] for disease management. DME RW to be delivered by [facility]. CM will continue to monitor.   Expected Discharge Date:                  Expected Discharge Plan:  [place]  In-House Referral:  NA  Discharge planning Services  CM Consult  Post Acute Care Choice:  Durable Medical Equipment, Home Health Choice offered to:  Patient  DME Arranged:  Walker rolling DME Agency:  [facility]:   N/A HH Agency:   N/A  Status of Service: COMPLETED If discussed at [place] of Stay Meetings, dates discussed:    Additional Comments: 1250 03-06-17 Jacqlyn Krauss, RN BSN [phone] Pt will not be able to get HHPT Services- no qualifying diagnosis- Pt is refusing HHRN at this time. RW at the bedside. No further needs from CM @ this time. [name], RN 03/04/2017, 4:10 PM

## 2017-03-04 NOTE — Progress Notes (Deleted)
Family Medicine Teaching Service Daily Progress Note Intern Pager: [phone]  Patient name: [name] Medical record number: [id number] Date of birth: 25-Feb-1961 Age: 56 y.o. Gender: male  Primary Care Provider: Jinny Blossom Total Access Care Consultants: None Code Status: Full  Pt Overview and Major Events to Date:  4/17 Admit FPTS  Assessment and Plan: [name] is a 56 y.o. male presenting with generalized weakness found to have resistant hypertension and hyperglycemia. PMH is significant for T2DM (Last A1c 7.6 in 08/2015), EtOH use with history of DTs, polysubstance abuse (cocaine), history of CVA, schizoaffective disorder, HLD  Hypertension, acute on chronic, improving  - Likely multifactorial cocaine/EtOH use and med non-adherence. BP improved from 208/128 >>> 138/102 with restarting home meds and PRN hydral.  CT head no acute changes.  Rec'd PRN hydral one time around midnight. Home regmin norvasc 5 mg daily, HCTZ 25 mg QD, clonidine 0.1 mg BID - Continue home meds with inc dose norvasc 10 mg, home dose HCTZ - DC clonidine given potental for rebound w poor compliance - PRN hydralazine SBP > 180 DBP >110 q6H - repleting mag, potassium  Generalized Weakness, HA, history of falls: Neuro exam is non-focal. Likely multifactorial: alcohol abuse/withdrawal, elevated BP. CT head no acute change - PT recs HHPT, rolling walker  Diabetes, stable - Hyperglycemic on admission, improved. Home regimen metformin 500 BID.  CBG 243, 173 overnight, received 10 sliding scale -  Hold home metformin, restart at discharge - Start Lantus 2u qd while hospitalized, titrate as needed - sensitive sliding scale insulin - monitor CBGs  Alcoholism with history of Delirium Tremens - withdrawal and DT history noted per chart review, patient is uncertain when this occurred. Last drink was 12 hours prior to admission per patient. LFT's consistent with chronic alcohol abuse. - monitor on  CIWA, likely will need Ativan - stable for transfer to regular floor - repleting mag as noted above, phos WNL  Substance Abuse - UDS notable for cocaine - avoid beta blockers other than labetalol given risk for unopposed alpha-receptor activity with recent cocaine use - cessation counseling - SW consult  HLD/Hx stroke (2013)- no lipid panel available. No focal neurologic signs on exam. No statin or ASA on med reconciliation. - no focal neurologic deficits, no suspicion for acute stroke - head CT as above - consider adding statin, ASA after this acute illness  Thrombocytopenia: History of thrombocytopenia. no signs of bleeding  - monitor  FEN/GI: Heart/Carb modified, no IV fluids, maintain IV access Prophylaxis: SCDs  Disposition: Monitoring BP control overnight given urgent state and polysubstance abuse. D/c likely within the next 24-48 hrs.   Subjective:  Patient feels sleepy this AM but denies headache or blurred vision. About to walk with physical therapy when I"m in the room.   Objective: Temp:  [98 F (36.7 C)-98.6 F (37 C)] 98.6 F (37 C) (04/17 0845) Pulse Rate:  [82-134] 85 (04/17 0529) Resp:  [11-37] 19 (04/17 0529) BP: (138-208)/(88-137) 138/102 (04/17 0529) SpO2:  [95 %-99 %] 97 % (04/17 0529) Weight:  [194 lb 8 oz (88.2 kg)-197 lb 8 oz (89.6 kg)] 194 lb 8 oz (88.2 kg) (04/17 0401) Physical Exam: General: NAD, sits comfortably in bed Cardiovascular: RRR, no m/r/g Respiratory: CTA bil, no W/R/R Abdomen: soft and nontender, nd Extremities: warm and well-perfused, no LE edema  Laboratory:  Recent Labs Lab 03/03/17 1008 03/04/17 0358  WBC 5.8 6.0  HGB 13.5 12.5*  HCT 41.2 38.2*  PLT 109* 109*  Recent Labs Lab 03/03/17 1008 03/03/17 1247 03/04/17 0358  NA 136  --  136  K 3.5  --  3.1*  CL 102  --  103  CO2 21*  --  24  BUN 17  --  12  CREATININE 1.00  --  0.79  CALCIUM 8.3*  --  8.1*  PROT  --  7.3 6.4*  BILITOT  --  0.7 0.9  ALKPHOS   --  60 49  ALT  --  36 34  AST  --  75* 70*  GLUCOSE 472*  --  199*     Imaging/Diagnostic Tests:  CT head noncontrast 4/16 IMPRESSION: 1. No evidence of traumatic intracranial injury or fracture. 2. Chronic lacunar infarcts at the medial frontal lobes bilaterally. 3. Mild small vessel ischemic microangiopathy.   Everrett Coombe, MD 03/04/2017, 12:01 PM PGY-1, [place] Intern pager: [phone], text pages welcome

## 2017-03-04 NOTE — Progress Notes (Signed)
Inpatient Diabetes Program Recommendations  AACE/ADA: New Consensus Statement on Inpatient Glycemic Control (2015)  Target Ranges:  Prepandial:   less than 140 mg/dL      Peak postprandial:   less than 180 mg/dL (1-2 hours)      Critically ill patients:  140 - 180 mg/dL   Lab Results  Component Value Date   GLUCAP 262 (H) 03/04/2017   HGBA1C 7.6 (H) 08/30/2015    Review of Glycemic Control Results for [name], [name] (MRN [id number]) as of 03/04/2017 13:03  Ref. Range 03/03/2017 17:22 03/03/2017 21:10 03/04/2017 07:46 03/04/2017 11:04  Glucose-Capillary Latest Ref Range: 65 - 99 mg/dL 173 (H) 234 (H) 223 (H) 262 (H)   Diabetes history: DM2 Outpatient Diabetes medications: Metformin 500 mg bid Current orders for Inpatient glycemic control: Lantus 2 units qd + Novolog correction 0-9 units tid  Inpatient Diabetes Program Recommendations:    Please consider increase in Lantus to 18 units daily (.2 units/kg x 88.2 kg).  Noted pending A1c.  Thank you, Nani Gasser. Keiarah Orlowski, RN, MSN, CDE  Diabetes Coordinator Inpatient Glycemic Control Team Team Pager [phone] (8am-5pm) 03/04/2017 1:06 PM

## 2017-03-04 NOTE — Progress Notes (Signed)
Family Medicine Teaching Service Daily Progress Note Intern Pager: [phone]  Patient name: [name]       Medical record number: [id number] Date of birth: [birth year]      Age: 56 y.o.    Gender: male  Primary Care Provider: [facility] Consultants: None Code Status: Full  Pt Overview and Major Events to Date:  4/17 Admit FPTS  Assessment and Plan: Freeman McLendonis a 56 y.o.malepresenting with generalized weakness found to have resistant hypertension and hyperglycemia. PMH is significant for T2DM (Last A1c 7.6 in 08/2015), EtOH use with history of DTs, polysubstance abuse (cocaine), history of CVA, schizoaffective disorder, HLD  Hypertension, acute on chronic, improving - Likely multifactorial cocaine/EtOH use and med non-adherence. BP improved from 208/128 >>> 138/102 with restarting home meds and PRN hydral.  CT head no acute changes. Rec'd PRN hydral one time around midnight. Home regmin norvasc 5 mg daily, HCTZ 25 mg QD, clonidine 0.1 mg BID - Continue home meds with inc dose norvasc 10 mg, home dose HCTZ - DC clonidine given potental for rebound w poor compliance - PRN hydralazine SBP > 180 DBP >110 q6H - repleting mag, potassium  Generalized Weakness, HA, history of falls: Neuro exam is non-focal. Likely multifactorial: alcohol abuse/withdrawal, elevated BP. CT head no acute change - PT recs HHPT, rolling walker  Diabetes, stable- Hyperglycemic on admission, improved. Home regimen metformin 500 BID.  CBG 243, 173 overnight, received 10 sliding scale - Hold home metformin, restart at discharge - Start Lantus 2u qd while hospitalized, titrate as needed - sensitive sliding scale insulin - monitor CBGs  Alcoholism with history of Delirium Tremens - withdrawal andDT history noted per chart review, patient is uncertain when this occurred. Last drink was 12 hours prior to admission per patient. LFT's consistent with chronic alcohol  abuse. - monitor on CIWA, likely will need Ativan - stable for transfer to regular floor - repleting mag as noted above, phos WNL  Substance Abuse - UDS notable for cocaine - avoid beta blockers other than labetalol given risk for unopposed alpha-receptor activity with recent cocaine use - cessation counseling - SW consult  HLD/Hx stroke (2013)- no lipid panel available. No focal neurologic signs on exam. No statin or ASA on med reconciliation. - no focal neurologic deficits, no suspicion for acute stroke - head CT as above - consider adding statin, ASA after this acute illness  Thrombocytopenia: History of thrombocytopenia. no signs of bleeding  - monitor  FEN/GI: Heart/Carb modified, no IV fluids, maintain IV access Prophylaxis: SCDs  Disposition: Monitoring BP control overnight given urgent state and polysubstance abuse. D/c likely within the next 24-48 hrs.   Subjective:  Patient feels sleepy this AM but denies headache or blurred vision. About to walk with physical therapy when I"m in the room.   Objective: Temp:  [98 F (36.7 C)-98.6 F (37 C)] 98.6 F (37 C) (04/17 0845) Pulse Rate:  [82-134] 85 (04/17 0529) Resp:  [11-37] 19 (04/17 0529) BP: (138-208)/(88-137) 138/102 (04/17 0529) SpO2:  [95 %-99 %] 97 % (04/17 0529) Weight:  [194 lb 8 oz (88.2 kg)-197 lb 8 oz (89.6 kg)] 194 lb 8 oz (88.2 kg) (04/17 0401) Physical Exam: General: NAD, sits comfortably in bed Cardiovascular: RRR, no m/r/g Respiratory: CTA bil, no W/R/R Abdomen: soft and nontender, nd Extremities: warm and well-perfused, no LE edema  Laboratory:  Last Labs    Recent Labs Lab 03/03/17 1008 03/04/17 0358  WBC 5.8 6.0  HGB 13.5 12.5*  HCT 41.2 38.2*  PLT 109* 109*      Last Labs    Recent Labs Lab 03/03/17 1008 03/03/17 1247 03/04/17 0358  NA 136  --  136  K 3.5  --  3.1*  CL 102  --  103  CO2 21*  --  24  BUN 17  --  12  CREATININE 1.00  --  0.79  CALCIUM 8.3*  --   8.1*  PROT  --  7.3 6.4*  BILITOT  --  0.7 0.9  ALKPHOS  --  60 49  ALT  --  36 34  AST  --  75* 70*  GLUCOSE 472*  --  199*       Imaging/Diagnostic Tests:  CT head noncontrast 4/16 IMPRESSION: 1. No evidence of traumatic intracranial injury or fracture. 2. Chronic lacunar infarcts at the medial frontal lobes bilaterally. 3. Mild small vessel ischemic microangiopathy.   Everrett Coombe, MD 03/04/2017, 12:01 PM PGY-1, [place] [place] Intern pager: [phone], text pages welcome

## 2017-03-05 ENCOUNTER — Encounter (HOSPITAL_COMMUNITY): Payer: Self-pay | Admitting: General Practice

## 2017-03-05 LAB — BASIC METABOLIC PANEL
ANION GAP: 7 (ref 5–15)
BUN: 8 mg/dL (ref 6–20)
CHLORIDE: 104 mmol/L (ref 101–111)
CO2: 26 mmol/L (ref 22–32)
Calcium: 8.6 mg/dL — ABNORMAL LOW (ref 8.9–10.3)
Creatinine, Ser: 0.83 mg/dL (ref 0.61–1.24)
Glucose, Bld: 189 mg/dL — ABNORMAL HIGH (ref 65–99)
POTASSIUM: 3.5 mmol/L (ref 3.5–5.1)
SODIUM: 137 mmol/L (ref 135–145)

## 2017-03-05 LAB — CBC
HCT: 39 % (ref 39.0–52.0)
HEMOGLOBIN: 12.8 g/dL — AB (ref 13.0–17.0)
MCH: 29.4 pg (ref 26.0–34.0)
MCHC: 32.8 g/dL (ref 30.0–36.0)
MCV: 89.4 fL (ref 78.0–100.0)
PLATELETS: 117 10*3/uL — AB (ref 150–400)
RBC: 4.36 MIL/uL (ref 4.22–5.81)
RDW: 13.6 % (ref 11.5–15.5)
WBC: 5.5 10*3/uL (ref 4.0–10.5)

## 2017-03-05 LAB — GLUCOSE, CAPILLARY
GLUCOSE-CAPILLARY: 233 mg/dL — AB (ref 65–99)
GLUCOSE-CAPILLARY: 251 mg/dL — AB (ref 65–99)
GLUCOSE-CAPILLARY: 209 mg/dL — AB (ref 65–99)
Glucose-Capillary: 193 mg/dL — ABNORMAL HIGH (ref 65–99)

## 2017-03-05 LAB — HEMOGLOBIN A1C
Hgb A1c MFr Bld: 11.5 % — ABNORMAL HIGH (ref 4.8–5.6)
Mean Plasma Glucose: 283 mg/dL

## 2017-03-05 LAB — MAGNESIUM: MAGNESIUM: 1.7 mg/dL (ref 1.7–2.4)

## 2017-03-05 MED ORDER — CLONIDINE HCL 0.1 MG PO TABS
0.1000 mg | ORAL_TABLET | Freq: Two times a day (BID) | ORAL | Status: DC
  Administered 2017-03-05 – 2017-03-06 (×3): 0.1 mg via ORAL
  Filled 2017-03-05 (×3): qty 1

## 2017-03-05 MED ORDER — IBUPROFEN 600 MG PO TABS
600.0000 mg | ORAL_TABLET | Freq: Once | ORAL | Status: AC
  Administered 2017-03-05: 600 mg via ORAL
  Filled 2017-03-05: qty 1

## 2017-03-05 MED ORDER — HYDRALAZINE HCL 20 MG/ML IJ SOLN
10.0000 mg | INTRAMUSCULAR | Status: DC | PRN
Start: 2017-03-05 — End: 2017-03-06
  Administered 2017-03-05: 10 mg via INTRAVENOUS
  Filled 2017-03-05: qty 1

## 2017-03-05 MED ORDER — HYDRALAZINE HCL 20 MG/ML IJ SOLN
5.0000 mg | Freq: Four times a day (QID) | INTRAMUSCULAR | Status: DC | PRN
  Filled 2017-03-05: qty 1

## 2017-03-05 MED ORDER — INSULIN GLARGINE 100 UNIT/ML ~~LOC~~ SOLN: 3.0000 [IU] | Freq: Every day | SUBCUTANEOUS | Status: DC

## 2017-03-05 MED ORDER — IBUPROFEN 600 MG PO TABS
600.0000 mg | ORAL_TABLET | Freq: Four times a day (QID) | ORAL | Status: DC | PRN
  Administered 2017-03-06: 600 mg via ORAL
  Filled 2017-03-05: qty 1

## 2017-03-05 MED ORDER — HYDRALAZINE HCL 20 MG/ML IJ SOLN
5.0000 mg | INTRAMUSCULAR | Status: DC | PRN
  Administered 2017-03-05 (×2): 5 mg via INTRAVENOUS
  Filled 2017-03-05: qty 1

## 2017-03-05 MED ORDER — CLONIDINE HCL 0.1 MG/24HR TD PTWK: 0.1000 mg | MEDICATED_PATCH | TRANSDERMAL | Status: DC

## 2017-03-05 MED ORDER — INSULIN GLARGINE 100 UNIT/ML ~~LOC~~ SOLN
3.0000 [IU] | Freq: Every day | SUBCUTANEOUS | Status: DC
Start: 2017-03-05 — End: 2017-03-06
  Administered 2017-03-05: 3 [IU] via SUBCUTANEOUS
  Filled 2017-03-05 (×2): qty 0.03

## 2017-03-05 MED ORDER — CLONIDINE HCL 0.2 MG/24HR TD PTWK
0.2000 mg | MEDICATED_PATCH | TRANSDERMAL | Status: DC
Start: 2017-03-05 — End: 2017-03-06
  Administered 2017-03-05: 0.2 mg via TRANSDERMAL
  Filled 2017-03-05: qty 1

## 2017-03-05 MED ORDER — METFORMIN HCL 500 MG PO TABS
500.0000 mg | ORAL_TABLET | Freq: Two times a day (BID) | ORAL | Status: DC
  Administered 2017-03-05 – 2017-03-06 (×2): 500 mg via ORAL
  Filled 2017-03-05 (×2): qty 1

## 2017-03-05 NOTE — Progress Notes (Signed)
Paged Family medicine regarding pt's 10/10 HA. BP elevated still at 148/104. Pt requesting additional pain medication. MD came to see pt at bedside. Additional orders received to help control pain. See MAR. Will continue to monitor.  Jacqlyn Larsen, RN

## 2017-03-05 NOTE — Progress Notes (Addendum)
Inpatient Diabetes Program Recommendations  AACE/ADA: New Consensus Statement on Inpatient Glycemic Control (2015)  Target Ranges:  Prepandial:   less than 140 mg/dL      Peak postprandial:   less than 180 mg/dL (1-2 hours)      Critically ill patients:  140 - 180 mg/dL   Lab Results  Component Value Date   GLUCAP 251 (H) 03/05/2017   HGBA1C 11.5 (H) 03/03/2017    Review of Glycemic Control  Diabetes history: DM2 Outpatient Diabetes medications: Metformin 500 mg bid Current orders for Inpatient glycemic control: Lantus 3 units qd + Novolog correction 0-9 units tid  Inpatient Diabetes Program Recommendations:    Please consider increase in Lantus to 18 units daily (.2 units/kg x 88.2 kg). Sent text page to [facility] Medicine intern pager. Attempted to speak to patient regarding elevated A1c 11.5 but was busy with physical therapy. Gave pamphlet on A1c and basic nutrition to review and will attempt to speak to pt. Tomorrow.  Thank you, Nani Gasser. Rock Sobol, RN, MSN, CDE  Diabetes Coordinator Inpatient Glycemic Control Team Team Pager [phone] (8am-5pm) 03/05/2017 2:12 PM

## 2017-03-05 NOTE — Progress Notes (Signed)
[name] Shawna Orleans DO with family medicine about headache the patient continues to have and tylenol or cold therapy not working much. Order one time dose ibuprofen 600 mg tablet. I will continue to monitor the patient closely.  Saddie Benders RN

## 2017-03-05 NOTE — Progress Notes (Signed)
Physical Therapy Treatment Patient Details Name: [name] MRN: [id number] DOB: 12-03-1960 Today's Date: 03/05/2017    History of Present Illness [name] is a 56 y.o. male presenting with generalized weakness found to have resistant hypertension and hyperglycemia. PMH is significant for T2DM (Last A1c 7.6 in 08/2015), EtOH use with history of DTs, polysubstance abuse (cocaine), history of CVA, schizoaffective disorder, HLD    PT Comments    Patient progressing with mobility this session with improved step length, decreased assist needed and improved distance, though did report feeling "exhausted" after ambulation.  Stated again his friend could be of assist at home.  Will follow until d/c,   Follow Up Recommendations  Supervision - Intermittent;Home health PT     Equipment Recommendations  Rolling walker with 5" wheels    Recommendations for Other Services       Precautions / Restrictions Precautions Precautions: Fall Precaution Comments: watch BP    Mobility  Bed Mobility   Bed Mobility: Supine to Sit     Supine to sit: Supervision;HOB elevated        Transfers Overall transfer level: Needs assistance   Transfers: Sit to/from Stand Sit to Stand: Supervision;Min guard         General transfer comment: no physical help, but mod cues for safety due to multiple lines  Ambulation/Gait Ambulation/Gait assistance: Supervision Ambulation Distance (Feet): 100 Feet Assistive device: Rolling walker (2 wheeled) Gait Pattern/deviations: Step-through pattern;Decreased stride length     General Gait Details: able to manage RW well and note better step length than yesterday   Stairs            Wheelchair Mobility    Modified Rankin (Stroke Patients Only)       Balance Overall balance assessment: Needs assistance   Sitting balance-Leahy Scale: Good       Standing balance-Leahy Scale: Fair                               Cognition Arousal/Alertness: Awake/alert Behavior During Therapy: WFL for tasks assessed/performed Overall Cognitive Status: History of cognitive impairments - at baseline (admits to not remembering PT yesterday)                                        Exercises      General Comments        Pertinent Vitals/Pain Pain Assessment: Faces Faces Pain Scale: Hurts little more Pain Location: headache Pain Descriptors / Indicators: Headache Pain Intervention(s): Monitored during session    Home Living Family/patient expects to be discharged to:: Private residence Living Arrangements: Alone                  Prior Function            PT Goals (current goals can now be found in the care plan section) Progress towards PT goals: Progressing toward goals    Frequency    Min 3X/week      PT Plan Current plan remains appropriate    Co-evaluation             End of Session Equipment Utilized During Treatment: Gait belt Activity Tolerance: Patient tolerated treatment well (BP 162/115) Patient left: in chair;with call bell/phone within reach;with chair alarm set   PT Visit Diagnosis: Unsteadiness on feet (R26.81);Other abnormalities of gait and mobility (R26.89);Muscle  weakness (generalized) (M62.81)     Time: 8088-1103 PT Time Calculation (min) (ACUTE ONLY): 19 min  Charges:  $Gait Training: 8-22 mins                    G CodesMagda Brown, [name] 03/05/2017    [name] 03/05/2017, 2:47 PM

## 2017-03-05 NOTE — Progress Notes (Signed)
Family Medicine Teaching Service Daily Progress Note Intern Pager: [phone]  Patient name: [name]       Medical record number: [id number] Date of birth: 1961-01-16      Age: 56 y.o.    Gender: male  Primary Care Provider: [facility] Bl[facility] Consultants: None Code Status: Full  Pt Overview and Major Events to Date:  4/17 Admit FPTS  Assessment and Plan: [name] a 56 y.o.malepresenting with generalized weakness found to have resistant hypertension and hyperglycemia. PMH is significant for T2DM (Last A1c 7.6 in 08/2015), EtOH use with history of DTs, polysubstance abuse (cocaine), history of CVA, schizoaffective disorder, HLD  Hypertension, acute on chronic, improving: Likely multifactorial cocaine/EtOH use and med non-adherence. BP still elevated at 160/112. Home regimen norvasc 5 mg daily, HCTZ 25 mg QD, clonidine 0.1 mg BID. Required prn hydralazine this morning. - Continue norvasc 10 mg qd, HCTZ 25mg  qd  - Start clonidine 0.2mg  q weekly that he will go home on, clonidine 0.1 mg po BID to load him while he is here and for the patch to take effect. - PRN hydralazine SBP > 180 DBP >110 q6H  Generalized Weakness, HA, history of falls: Neuro exam is non-focal. Likely multifactorial: alcohol abuse/withdrawal, elevated BP. CT head no acute change - PT recs: HHPT, rolling walker - Per CM: patient does not have qualifying diagnosis for HHPT  Diabetes, stable- Hyperglycemic on admission, improved. Home regimen metformin 500 BID. am CBG 189, 233 - restart home metformin - Lantus increase to 3U qd - sensitive sliding scale insulin  Alcoholism with history of Delirium Tremens - withdrawal andDT history noted per chart review, patient is uncertain when this occurred. Last drink was 12 hours prior to admission per patient. LFT's consistent with chronic alcohol abuse. - monitoring on CIWA: scores 2, 2  Substance Abuse - UDS notable for  cocaine - avoid beta blockers other than labetalol given risk for unopposed alpha-receptor activity with recent cocaine use - cessation counseling - SW consult  HLD/Hx stroke (2013)- no lipid panel available. No focal neurologic signs on exam and head CT as above so no suspicion for acute stroke. No statin or ASA on med reconciliation - consider adding statin, ASA after this acute illness  Thrombocytopenia: History of thrombocytopenia. no signs of bleeding  - monitor  FEN/GI: Heart/Carb modified, no IV fluids, maintain IV access Prophylaxis: SCDs  Disposition: BP still uncontrolled requiring initiation of new medications and prn IV medications. D/c likely within the next 24-48 hrs pending better BP control.  Subjective:  Patient c/o HA this am but no blurry vision or double vision. States no acute changes in his generalized weakness or slurred speech.  Objective: Temp:  [98.1 F (36.7 C)-98.7 F (37.1 C)] 98.4 F (36.9 C) (04/18 0744) Pulse Rate:  [34-96] 96 (04/18 0912) Resp:  [10-24] 24 (04/18 0912) BP: (141-170)/(94-117) 160/112 (04/18 0912) SpO2:  [95 %-100 %] 98 % (04/18 0912) Weight:  [194 lb 3.2 oz (88.1 kg)] 194 lb 3.2 oz (88.1 kg) (04/18 0500)   Physical Exam: General: NAD, sits comfortably in bed Cardiovascular: RRR, no m/r/g Respiratory: CTA bil, no W/R/R Abdomen: soft and nontender, nd Extremities: warm and well-perfused, no LE edema Neuro: alert and oriented. CN II-XII intact. Strength 5/5 and sensation intact throughout.   Laboratory:  Recent Labs Lab 03/03/17 1008 03/04/17 0358 03/05/17 0429  WBC 5.8 6.0 5.5  HGB 13.5 12.5* 12.8*  HCT 41.2 38.2* 39.0  PLT 109* 109* 117*  Recent Labs Lab 03/03/17 1008 03/03/17 1247 03/04/17 0358 03/05/17 0429  NA 136  --  136 137  K 3.5  --  3.1* 3.5  CL 102  --  103 104  CO2 21*  --  24 26  BUN 17  --  12 8  CREATININE 1.00  --  0.79 0.83  CALCIUM 8.3*  --  8.1* 8.6*  PROT  --  7.3 6.4*  --    BILITOT  --  0.7 0.9  --   ALKPHOS  --  60 49  --   ALT  --  36 34  --   AST  --  75* 70*  --   GLUCOSE 472*  --  199* 189*   No results found.  Bufford Lope, DO 03/05/2017, 9:45 AM PGY-1, [place] Intern pager: [phone], text pages welcome

## 2017-03-05 NOTE — Progress Notes (Signed)
Paged Family medicine and let them know that my treatment including PRN medication is not controlling the elevated blood pressure. Currently is BP 167/115, HR 87. (please see flowsheet for BP and MAR for PRN medications). I will continue to monitor the patient closely.   Saddie Benders RN

## 2017-03-05 NOTE — Progress Notes (Signed)
PT Cancellation Note  Patient Details Name: Keri Tavella MRN: [id number] DOB: 1961-08-09   Cancelled Treatment:    Reason Eval/Treat Not Completed: Medical issues which prohibited therapy; BP currently 188/129 and RN reports s/p meds and to check later to see if effective.  Will return as time permits.   Reginia Naas 03/05/2017, 12:23 PM Magda Kiel, [place] 03/05/2017

## 2017-03-06 DIAGNOSIS — T1490XA Injury, unspecified, initial encounter: Secondary | ICD-10-CM

## 2017-03-06 LAB — BASIC METABOLIC PANEL
ANION GAP: 8 (ref 5–15)
BUN: 10 mg/dL (ref 6–20)
CO2: 24 mmol/L (ref 22–32)
CREATININE: 0.81 mg/dL (ref 0.61–1.24)
Calcium: 9.4 mg/dL (ref 8.9–10.3)
Chloride: 103 mmol/L (ref 101–111)
GFR calc Af Amer: >60 mL/min (ref >60)
GFR calc non Af Amer: >60 mL/min (ref >60)
Glucose, Bld: 171 mg/dL — ABNORMAL HIGH (ref 65–99)
POTASSIUM: 3.1 mmol/L — AB (ref 3.5–5.1)
SODIUM: 135 mmol/L (ref 135–145)

## 2017-03-06 LAB — CBC
HEMATOCRIT: 40 % (ref 39.0–52.0)
HEMOGLOBIN: 13.2 g/dL (ref 13.0–17.0)
MCH: 29.3 pg (ref 26.0–34.0)
MCHC: 33 g/dL (ref 30.0–36.0)
MCV: 88.9 fL (ref 78.0–100.0)
Platelets: 124 10*3/uL — ABNORMAL LOW (ref 150–400)
RBC: 4.5 MIL/uL (ref 4.22–5.81)
RDW: 13.5 % (ref 11.5–15.5)
WBC: 6.3 10*3/uL (ref 4.0–10.5)

## 2017-03-06 LAB — GLUCOSE, CAPILLARY
Glucose-Capillary: 193 mg/dL — ABNORMAL HIGH (ref 65–99)
Glucose-Capillary: 231 mg/dL — ABNORMAL HIGH (ref 65–99)

## 2017-03-06 MED ORDER — CLONIDINE HCL 0.3 MG/24HR TD PTWK
0.3000 mg | MEDICATED_PATCH | TRANSDERMAL | Status: DC
Start: 2017-03-12 — End: 2017-03-06

## 2017-03-06 MED ORDER — CLONIDINE HCL 0.3 MG/24HR TD PTWK: 0.3000 mg | MEDICATED_PATCH | TRANSDERMAL | 0 refills | Status: DC

## 2017-03-06 MED ORDER — HYDROCHLOROTHIAZIDE 25 MG PO TABS: 25.0000 mg | ORAL_TABLET | Freq: Every day | ORAL | 0 refills | Status: DC

## 2017-03-06 MED ORDER — INSULIN GLARGINE 100 UNIT/ML ~~LOC~~ SOLN
5.0000 [IU] | Freq: Every day | SUBCUTANEOUS | Status: DC
  Administered 2017-03-06: 5 [IU] via SUBCUTANEOUS
  Filled 2017-03-06: qty 0.05

## 2017-03-06 MED ORDER — AMLODIPINE BESYLATE 10 MG PO TABS: 10.0000 mg | ORAL_TABLET | Freq: Every day | ORAL | 0 refills | Status: DC

## 2017-03-06 MED ORDER — POTASSIUM CHLORIDE CRYS ER 20 MEQ PO TBCR
40.0000 meq | EXTENDED_RELEASE_TABLET | Freq: Two times a day (BID) | ORAL | Status: DC
  Administered 2017-03-06: 40 meq via ORAL
  Filled 2017-03-06: qty 2

## 2017-03-06 NOTE — Progress Notes (Signed)
Inpatient Diabetes Program Recommendations  AACE/ADA: New Consensus Statement on Inpatient Glycemic Control (2015)  Target Ranges:  Prepandial:   less than 140 mg/dL      Peak postprandial:   less than 180 mg/dL (1-2 hours)      Critically ill patients:  140 - 180 mg/dL   Lab Results  Component Value Date   GLUCAP 193 (H) 03/06/2017   HGBA1C 11.5 (H) 03/03/2017    Review of Glycemic Control Results for KRISHANG, READING (MRN [id number]) as of 03/06/2017 08:10  Ref. Range 03/05/2017 07:43 03/05/2017 11:21 03/05/2017 16:05 03/05/2017 21:12 03/06/2017 07:29  Glucose-Capillary Latest Ref Range: 65 - 99 mg/dL 233 (H) 251 (H) 209 (H) 193 (H) 193 (H)   Inpatient Diabetes Program Recommendations:   Spoke with pt @ bedside about A1C results 11.5 (average BG 293) and explained what an A1C is, basic pathophysiology of DM Type 2, basic home care, basic diabetes diet nutrition principles, importance of checking CBGs and maintaining good CBG control to prevent long-term and short-term complications. Reviewed signs and symptoms of hyperglycemia and hypoglycemia and how to treat hypoglycemia at home. Also reviewed blood sugar goals at home.  Patient does not have a meter @ home and will need order @ D/C: Blood glucose meter kit [id number]. Patient states willingness to decrease drinks with sugar.  Thank you, Nani Gasser. Danielly Ackerley, RN, MSN, CDE  Diabetes Coordinator Inpatient Glycemic Control Team Team Pager [phone] (8am-5pm) 03/06/2017 8:13 AM

## 2017-03-06 NOTE — Discharge Summary (Signed)
[facility] Discharge Summary  Patient name: [name] Medical record number: [id number] Date of birth: Mar 23, 1961 Age: 56 y.o. Gender: male Date of Admission: 03/03/2017  Date of Discharge: 03/06/2017 Admitting Physician: Zenia Resides, MD  Primary Care Provider: [facility] Bl[facility] Consultants: None  Indication for Hospitalization: Resistant HTN  Discharge Diagnoses/Problem List:  Patient Active Problem List   Diagnosis Date Noted  . Trauma   . Tachycardia 03/03/2017  . Alcohol abuse   . Cocaine abuse   . Hypertensive urgency   . Tachycardia   . Cocaine abuse with cocaine-induced mood disorder ([place]) 12/05/2016  . Cerebrovascular disease 09/02/2015  . 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 Malignant hypertension 08/30/2015  . Alcohol use disorder, severe, dependence ([place]) 08/30/2015  . Alcohol withdrawal ([place]) 08/30/2015  . 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 Thrombocytopenia ([place]) 08/27/2015  . Tobacco use disorder 08/26/2015  . Hyperlipidemia 04/28/2015  . Schizoaffective disorder, depressive type ([place])   . Gout 08/09/2013  . Diabetes mellitus ([place]) 07/09/2012  . Polysubstance abuse 07/09/2012     Disposition: Home  Discharge Condition: Stable/Improved  Discharge Exam:  Temp:  [97.5 F (36.4 C)-98.7 F (37.1 C)] 98.2 F (36.8 C) (04/19 0442) Pulse Rate:  [72-103] 95 (04/19 0442) Resp:  [10-24] 13 (04/19 0442) BP: (142-188)/(95-129) 142/107 (04/19 0410) SpO2:  [95 %-100 %] 99 % (04/19 0442) Weight:  [191 lb (86.6 kg)] 191 lb (86.6 kg) (04/19 0442)   Physical Exam: General: NAD, rests comfortably in bed Cardiovascular: RRR, no m/r/g Respiratory: CTA bil, no W/R/R Abdomen: soft, nt, nd Extremities: warm and well-perfused, no LE edema Neuro: alert and oriented. CN II-XII intact.     Brief Hospital Course:  [name] a 56 y.o.malepresenting with generalized weakness found to have resistant hypertension and hyperglycemia. PMH is significant for T2DM (Last A1c 7.6 in 08/2015), EtOH use with history of DTs, polysubstance abuse (cocaine), history of CVA, schizoaffective disorder, HLD  Hypertension Patient presented with hypertension to 208/100s, unable to control in the ED. On admission his home regimen norvasc 5 mg daily, HCTZ 25 mg QD, clonidine 0.1 mg BID, however he had not been taking these medications every day as prescribed.  He was restarted on home medications. Norvasc was titrated up to 10 mg daily. It was thought that he would be more compliant with a clonidine patch, and so he was loaded with clonidine and started on a 0.3 mg patch to be changed weekly. At discharge BP was 142/107 much improved from previously.  Generalized Weakness On admission patient endorsed generalized weakness, which was his primary reason for coming into the emergency department. He thought this was due to recent alcohol use and cocaine use the day prior to admission. Neuro exam was nonfocal throughout his hospital stay. CT head was completed given that patient cannot provide history regarding falls, there is no acute change on the head CT.  Diabetes Patient was noted to be hyperglycemic on admission. His sugars were controlled with Lantus 3 units nightly and a sliding scale. His home regimen metformin 500 BID. Home regimen was restarted at discharge.  Polysubstance abuse, Alcoholism with history of Delirium Tremens  Withdrawal andDT history noted per chart review, patient was uncertain when this occurred. Last drink was 12 hours prior to admission per patient. He was monitored on CIWA and scored low throughout his stay. Cocaine was noted on UDS. Cessation counseling provided.  Issues for Follow Up:  1. Please follow up medication compliance. Patient admits to poor  medication adherence as an outpatient. 2. Please follow up blood pressure, titrate regimen as appropriate.  Significant Procedures: None  Significant Labs and Imaging:   UDS - cocaine UA - Glucose >500, protein 30, hyaline casts CMP - AST 75, ALT 36, Alkp P 60  Recent Labs Lab 03/04/17 0358 03/05/17 0429 03/06/17 0725  WBC 6.0 5.5 6.3  HGB 12.5* 12.8* 13.2  HCT 38.2* 39.0 40.0  PLT 109* 117* 124*    Recent Labs Lab 03/03/17 1008 03/03/17 1247 03/03/17 2036 03/04/17 0358 03/05/17 0429 03/06/17 0321  NA 136  --   --  136 137 135  K 3.5  --   --  3.1* 3.5 3.1*  CL 102  --   --  103 104 103  CO2 21*  --   --  24 26 24   GLUCOSE 472*  --   --  199* 189* 171*  BUN 17  --   --  12 8 10   CREATININE 1.00  --   --  0.79 0.83 0.81  CALCIUM 8.3*  --   --  8.1* 8.6* 9.4  MG  --   --  1.5*  --  1.7  --   PHOS  --   --  2.7  --   --   --   ALKPHOS  --  60  --  49  --   --   AST  --  75*  --  70*  --   --   ALT  --  36  --  34  --   --   ALBUMIN  --  3.6  --  3.2*  --   --       Results/Tests Pending at Time of Discharge: NOne  Discharge Medications:  Allergies as of 03/06/2017   No Known Allergies     Medication List    STOP taking these medications   cloNIDine 0.1 MG tablet Commonly known as:  CATAPRES Replaced by:  cloNIDine 0.3 mg/24hr patch     TAKE these medications   amLODipine 10 MG tablet Commonly known as:  NORVASC Take 1 tablet (10 mg total) by mouth daily. What changed:  medication strength  how much to take   cloNIDine 0.3 mg/24hr patch Commonly known as:  CATAPRES - Dosed in mg/24 hr Place 1 patch (0.3 mg total) onto the skin once a week. Start taking on:  03/12/2017 Replaces:  cloNIDine 0.1 MG tablet   DULoxetine 30 MG capsule Commonly known as:  CYMBALTA Take 1 capsule (30 mg total) by mouth 2 (two) times daily.   hydrochlorothiazide 25 MG tablet Commonly known as:  HYDRODIURIL Take 1 tablet (25 mg total) by mouth daily.    hydrOXYzine 25 MG tablet Commonly known as:  ATARAX/VISTARIL Take 1 tablet (25 mg total) by mouth 3 (three) times daily as needed for anxiety.   INVEGA SUSTENNA 156 MG/ML Susp injection Generic drug:  paliperidone Inject 156 mg into the muscle every 30 (thirty) days.   meloxicam 7.5 MG tablet Commonly known as:  MOBIC Take 7.5 mg by mouth 2 (two) times daily as needed for pain.   metFORMIN 500 MG tablet Commonly known as:  GLUCOPHAGE Take 1 tablet (500 mg total) by mouth 2 (two) times daily with a meal.   tiZANidine 2 MG tablet Commonly known as:  ZANAFLEX Take 2-4 mg by mouth every 8 (eight) hours as  needed for muscle spasms.       Discharge Instructions: Please refer to Patient Instructions section of EMR for full details.  Patient was counseled important signs and symptoms that should prompt return to medical care, changes in medications, dietary instructions, activity restrictions, and follow up appointments.   Follow-Up Appointments: Follow-up [facility] Follow up.   Why:  [place].  Contact information: [place] [address] [phone]        [facility] Follow up on 03/10/2017.   Specialty:  Family Medicine Why:  12:45PM for hospital follow up  Contact information: [address] [place] [address] [phone]           Everrett Coombe, MD 03/07/2017, 11:55 AM PGY-1, [place]

## 2017-03-06 NOTE — Discharge Instructions (Signed)
You were in the hospital for elevated blood pressure and withdrawal from alcohol. Your blood pressure medications were slightly changed. Please take your blood pressure medications as prescribed. You have an appointment with your primary care provider on 03/10/17 at 12:45PM. Please refrain from alcohol use.

## 2017-03-06 NOTE — Progress Notes (Signed)
Family Medicine Teaching Service Daily Progress Note Intern Pager: [phone]  Patient name: [name]       Medical record number: [id number] Date of birth: 1961/03/01      Age: 56 y.o.    Gender: male  Primary Care Provider: Jinny Blossom [facility] Consultants: None Code Status: Full  Pt Overview and Major Events to Date:  4/17 Admit FPTS  Assessment and Plan: Owain McLendonis a 56 y.o.malepresenting with generalized weakness found to have resistant hypertension and hyperglycemia. PMH is significant for T2DM (Last A1c 7.6 in 08/2015), EtOH use with history of DTs, polysubstance abuse (cocaine), history of CVA, schizoaffective disorder, HLD  Hypertension, acute on chronic, improving: Likely multifactorial cocaine/EtOH use and med non-adherence. BP improved at 142/107 . Home regimen norvasc 5 mg daily, HCTZ 25 mg QD, clonidine 0.1 mg BID. Has required PRN hydralazine. - Continue norvasc 10 mg qd, HCTZ 25mg  qd  - Start clonidine 0.2mg  q weekly that he will go home on, clonidine 0.1 mg po BID to load him while he is here and for the patch to take effect. - PRN hydralazine SBP > 180 DBP >110 q6H >>>may need to go on an additional agent, consider hydralazine   Generalized Weakness, HA, history of falls: Neuro exam is non-focal. Likely multifactorial: alcohol abuse/withdrawal, elevated BP. CT head no acute change - PT recs: HHPT, rolling walker - Per CM: patient does not have qualifying diagnosis for HHPT  Diabetes, stable- Hyperglycemic on admission, improved. Home regimen metformin 500 BID. am CBG 193, 171. Received 3 lantus, 11 sliding scale and also received 500 metformin. - restart home metformin - Increase lantus 3u >>> 5u today - continue sensitive sliding scale insulin  Hypokalemia 3.1, will replete with 40 mEq KDUR x2 doses - recheck AM BMP  Alcoholism with history of Delirium Tremens - withdrawal andDT history noted per chart review, patient is  uncertain when this occurred. Last drink was 12 hours prior to admission per patient.  - monitoring on CIWA: scores 3, 2, 2 - patient would like to discontinue EtOH abuse at discharge, SW consulted  Substance Abuse - UDS notable for cocaine - avoid beta blockers other than labetalol given risk for unopposed alpha-receptor activity with recent cocaine use - cessation counseling - SW consult, patient would like to quit  HLD/Hx stroke (2013)-  No focal neurologic signs on exam and head CT as above so no suspicion for acute stroke. No statin or ASA on med reconciliation - consider adding statin, ASA after this acute illness  Thrombocytopenia, improving: History of thrombocytopenia. no signs of bleeding  - monitor  FEN/GI: Heart/Carb modified, no IV fluids, maintain IV access Prophylaxis: SCDs  Disposition: BP still uncontrolled requiring initiation of new medications and prn IV medications. D/c likely within the next 24-48 hrs pending better BP control.  Subjective:  Patient continues to have frontal headache c/w previous headaches. Nonfocal neuro exam. Patient states he would like to discontinue drug and alcohol use when he is discharged.  Objective: Temp:  [97.5 F (36.4 C)-98.7 F (37.1 C)] 98.2 F (36.8 C) (04/19 0442) Pulse Rate:  [72-103] 95 (04/19 0442) Resp:  [10-24] 13 (04/19 0442) BP: (142-188)/(95-129) 142/107 (04/19 0410) SpO2:  [95 %-100 %] 99 % (04/19 0442) Weight:  [191 lb (86.6 kg)] 191 lb (86.6 kg) (04/19 0442)   Physical Exam: General: NAD, rests comfortably in bed Cardiovascular: RRR, no m/r/g Respiratory: CTA bil, no W/R/R Abdomen: soft, nt, nd Extremities: warm and well-perfused, no LE edema  Neuro: alert and oriented. CN II-XII intact.    Laboratory: UDS - cocaine UA - Glucose >500, protein 30, hyaline casts CMP - AST 75, ALT 36, Alkp P 60  Recent Labs Lab 03/04/17 0358 03/05/17 0429 03/06/17 0725  WBC 6.0 5.5 6.3  HGB 12.5* 12.8* 13.2   HCT 38.2* 39.0 40.0  PLT 109* 117* 124*    Recent Labs Lab 03/03/17 1247 03/04/17 0358 03/05/17 0429 03/06/17 0321  NA  --  136 137 135  K  --  3.1* 3.5 3.1*  CL  --  103 104 103  CO2  --  24 26 24   BUN  --  12 8 10   CREATININE  --  0.79 0.83 0.81  CALCIUM  --  8.1* 8.6* 9.4  PROT 7.3 6.4*  --   --   BILITOT 0.7 0.9  --   --   ALKPHOS 60 49  --   --   ALT 36 34  --   --   AST 75* 70*  --   --   GLUCOSE  --  199* 189* 171*   No results found.  Everrett Coombe, MD 03/06/2017, 8:37 AM PGY-1, [place] Intern pager: [phone], text pages welcome

## 2017-03-28 ENCOUNTER — Other Ambulatory Visit: Payer: Self-pay | Admitting: Internal Medicine

## 2017-11-22 ENCOUNTER — Inpatient Hospital Stay (HOSPITAL_COMMUNITY)
Admission: AD | Admit: 2017-11-22 | Discharge: 2017-11-27 | DRG: 885 | Disposition: A | Discharge status: Other Acute Inpatient Hospital | Payer: Medicaid Other | Source: Intra-hospital | Attending: Emergency Medicine | Admitting: Emergency Medicine

## 2017-11-22 ENCOUNTER — Other Ambulatory Visit: Payer: Self-pay

## 2017-11-22 ENCOUNTER — Encounter (HOSPITAL_COMMUNITY): Payer: Self-pay | Admitting: Emergency Medicine

## 2017-11-22 ENCOUNTER — Emergency Department (HOSPITAL_COMMUNITY): Payer: Medicaid Other

## 2017-11-22 ENCOUNTER — Encounter (HOSPITAL_COMMUNITY): Payer: Self-pay

## 2017-11-22 ENCOUNTER — Emergency Department (HOSPITAL_COMMUNITY)
Admission: EM | Admit: 2017-11-22 | Discharge: 2017-11-22 | Disposition: A | Discharge status: Other Acute Inpatient Hospital | Payer: Medicaid Other | Attending: Emergency Medicine | Admitting: Emergency Medicine

## 2017-11-22 DIAGNOSIS — F419 Anxiety disorder, unspecified: Secondary | ICD-10-CM | POA: Insufficient documentation

## 2017-11-22 DIAGNOSIS — Z046 Encounter for general psychiatric examination, requested by authority: Secondary | ICD-10-CM | POA: Insufficient documentation

## 2017-11-22 DIAGNOSIS — F1490 Cocaine use, unspecified, uncomplicated: Secondary | ICD-10-CM | POA: Diagnosis not present

## 2017-11-22 DIAGNOSIS — F329 Major depressive disorder, single episode, unspecified: Secondary | ICD-10-CM | POA: Diagnosis not present

## 2017-11-22 DIAGNOSIS — E119 Type 2 diabetes mellitus without complications: Secondary | ICD-10-CM | POA: Diagnosis not present

## 2017-11-22 DIAGNOSIS — Z7984 Long term (current) use of oral hypoglycemic drugs: Secondary | ICD-10-CM

## 2017-11-22 DIAGNOSIS — Z590 Homelessness: Secondary | ICD-10-CM

## 2017-11-22 DIAGNOSIS — R45850 Homicidal ideations: Secondary | ICD-10-CM | POA: Diagnosis not present

## 2017-11-22 DIAGNOSIS — R450 Nervousness: Secondary | ICD-10-CM | POA: Diagnosis not present

## 2017-11-22 DIAGNOSIS — Z8673 Personal history of transient ischemic attack (TIA), and cerebral infarction without residual deficits: Secondary | ICD-10-CM

## 2017-11-22 DIAGNOSIS — R45851 Suicidal ideations: Secondary | ICD-10-CM | POA: Insufficient documentation

## 2017-11-22 DIAGNOSIS — F209 Schizophrenia, unspecified: Secondary | ICD-10-CM | POA: Diagnosis not present

## 2017-11-22 DIAGNOSIS — R441 Visual hallucinations: Secondary | ICD-10-CM | POA: Diagnosis not present

## 2017-11-22 DIAGNOSIS — R748 Abnormal levels of other serum enzymes: Secondary | ICD-10-CM

## 2017-11-22 DIAGNOSIS — F1414 Cocaine abuse with cocaine-induced mood disorder: Secondary | ICD-10-CM | POA: Diagnosis present

## 2017-11-22 DIAGNOSIS — R443 Hallucinations, unspecified: Secondary | ICD-10-CM

## 2017-11-22 DIAGNOSIS — R531 Weakness: Secondary | ICD-10-CM | POA: Diagnosis not present

## 2017-11-22 DIAGNOSIS — F1410 Cocaine abuse, uncomplicated: Secondary | ICD-10-CM | POA: Diagnosis present

## 2017-11-22 DIAGNOSIS — F39 Unspecified mood [affective] disorder: Secondary | ICD-10-CM | POA: Diagnosis not present

## 2017-11-22 DIAGNOSIS — I160 Hypertensive urgency: Secondary | ICD-10-CM | POA: Diagnosis present

## 2017-11-22 DIAGNOSIS — Z79899 Other long term (current) drug therapy: Secondary | ICD-10-CM | POA: Diagnosis not present

## 2017-11-22 DIAGNOSIS — I10 Essential (primary) hypertension: Secondary | ICD-10-CM | POA: Diagnosis present

## 2017-11-22 DIAGNOSIS — F1010 Alcohol abuse, uncomplicated: Secondary | ICD-10-CM | POA: Diagnosis not present

## 2017-11-22 DIAGNOSIS — F1099 Alcohol use, unspecified with unspecified alcohol-induced disorder: Secondary | ICD-10-CM | POA: Diagnosis not present

## 2017-11-22 DIAGNOSIS — F17210 Nicotine dependence, cigarettes, uncomplicated: Secondary | ICD-10-CM | POA: Diagnosis present

## 2017-11-22 DIAGNOSIS — R4586 Emotional lability: Secondary | ICD-10-CM | POA: Diagnosis not present

## 2017-11-22 DIAGNOSIS — E785 Hyperlipidemia, unspecified: Secondary | ICD-10-CM | POA: Diagnosis not present

## 2017-11-22 DIAGNOSIS — F332 Major depressive disorder, recurrent severe without psychotic features: Secondary | ICD-10-CM | POA: Diagnosis not present

## 2017-11-22 DIAGNOSIS — Z9114 Patient's other noncompliance with medication regimen: Secondary | ICD-10-CM | POA: Insufficient documentation

## 2017-11-22 DIAGNOSIS — F1419 Cocaine abuse with unspecified cocaine-induced disorder: Secondary | ICD-10-CM | POA: Insufficient documentation

## 2017-11-22 DIAGNOSIS — F250 Schizoaffective disorder, bipolar type: Secondary | ICD-10-CM | POA: Diagnosis present

## 2017-11-22 DIAGNOSIS — G4700 Insomnia, unspecified: Secondary | ICD-10-CM | POA: Diagnosis present

## 2017-11-22 DIAGNOSIS — F10951 Alcohol use, unspecified with alcohol-induced psychotic disorder with hallucinations: Secondary | ICD-10-CM

## 2017-11-22 DIAGNOSIS — F1020 Alcohol dependence, uncomplicated: Secondary | ICD-10-CM | POA: Diagnosis present

## 2017-11-22 DIAGNOSIS — R440 Auditory hallucinations: Secondary | ICD-10-CM | POA: Diagnosis present

## 2017-11-22 DIAGNOSIS — M791 Myalgia, unspecified site: Secondary | ICD-10-CM | POA: Diagnosis not present

## 2017-11-22 DIAGNOSIS — F10239 Alcohol dependence with withdrawal, unspecified: Secondary | ICD-10-CM | POA: Diagnosis not present

## 2017-11-22 LAB — RAPID URINE DRUG SCREEN, HOSP PERFORMED
Amphetamines: NOT DETECTED
BARBITURATES: NOT DETECTED
Benzodiazepines: NOT DETECTED
COCAINE: POSITIVE — AB
Opiates: NOT DETECTED
TETRAHYDROCANNABINOL: NOT DETECTED

## 2017-11-22 LAB — CBC
HEMATOCRIT: 44.3 % (ref 39.0–52.0)
Hemoglobin: 14.6 g/dL (ref 13.0–17.0)
MCH: 29.3 pg (ref 26.0–34.0)
MCHC: 33 g/dL (ref 30.0–36.0)
MCV: 88.8 fL (ref 78.0–100.0)
PLATELETS: 124 10*3/uL — AB (ref 150–400)
RBC: 4.99 MIL/uL (ref 4.22–5.81)
RDW: 12.6 % (ref 11.5–15.5)
WBC: 7.2 10*3/uL (ref 4.0–10.5)

## 2017-11-22 LAB — COMPREHENSIVE METABOLIC PANEL
ALK PHOS: 86 U/L (ref 38–126)
ALT: 108 U/L — ABNORMAL HIGH (ref 17–63)
AST: 200 U/L — AB (ref 15–41)
Albumin: 3.6 g/dL (ref 3.5–5.0)
Anion gap: 14 (ref 5–15)
BILIRUBIN TOTAL: 0.8 mg/dL (ref 0.3–1.2)
BUN: 12 mg/dL (ref 6–20)
CALCIUM: 9.3 mg/dL (ref 8.9–10.3)
CO2: 22 mmol/L (ref 22–32)
Chloride: 98 mmol/L — ABNORMAL LOW (ref 101–111)
Creatinine, Ser: 1.07 mg/dL (ref 0.61–1.24)
GFR calc Af Amer: >60 mL/min (ref >60)
GFR calc non Af Amer: >60 mL/min (ref >60)
Glucose, Bld: 295 mg/dL — ABNORMAL HIGH (ref 65–99)
POTASSIUM: 3.1 mmol/L — AB (ref 3.5–5.1)
Sodium: 134 mmol/L — ABNORMAL LOW (ref 135–145)
TOTAL PROTEIN: 7.8 g/dL (ref 6.5–8.1)

## 2017-11-22 LAB — SALICYLATE LEVEL: Salicylate Lvl: <7 mg/dL (ref 2.8–30.0)

## 2017-11-22 LAB — ACETAMINOPHEN LEVEL: Acetaminophen (Tylenol), Serum: <10 ug/mL — ABNORMAL LOW (ref 10–30)

## 2017-11-22 LAB — ETHANOL: ALCOHOL ETHYL (B): 190 mg/dL — AB (ref <10)

## 2017-11-22 LAB — CBG MONITORING, ED
GLUCOSE-CAPILLARY: 258 mg/dL — AB (ref 65–99)
Glucose-Capillary: 301 mg/dL — ABNORMAL HIGH (ref 65–99)
Glucose-Capillary: 87 mg/dL (ref 65–99)

## 2017-11-22 LAB — I-STAT TROPONIN, ED: TROPONIN I, POC: 0.05 ng/mL (ref 0.00–0.08)

## 2017-11-22 LAB — GLUCOSE, CAPILLARY: Glucose-Capillary: 110 mg/dL — ABNORMAL HIGH (ref 65–99)

## 2017-11-22 MED ORDER — LOPERAMIDE HCL 2 MG PO CAPS: 2.0000 mg | ORAL_CAPSULE | ORAL | Status: AC | PRN

## 2017-11-22 MED ORDER — LORAZEPAM 1 MG PO TABS: 0.0000 mg | ORAL_TABLET | Freq: Two times a day (BID) | ORAL | Status: DC

## 2017-11-22 MED ORDER — THIAMINE HCL 100 MG/ML IJ SOLN: 100.0000 mg | Freq: Every day | INTRAMUSCULAR | Status: DC

## 2017-11-22 MED ORDER — INSULIN ASPART 100 UNIT/ML ~~LOC~~ SOLN
0.0000 [IU] | Freq: Three times a day (TID) | SUBCUTANEOUS | Status: DC
  Administered 2017-11-23 (×2): 5 [IU] via SUBCUTANEOUS
  Administered 2017-11-23 – 2017-11-24 (×2): 8 [IU] via SUBCUTANEOUS
  Administered 2017-11-24 (×2): 5 [IU] via SUBCUTANEOUS
  Administered 2017-11-25: 3 [IU] via SUBCUTANEOUS
  Administered 2017-11-25: 8 [IU] via SUBCUTANEOUS
  Administered 2017-11-25: 5 [IU] via SUBCUTANEOUS
  Administered 2017-11-26 (×2): 3 [IU] via SUBCUTANEOUS
  Administered 2017-11-26: 5 [IU] via SUBCUTANEOUS
  Filled 2017-11-22: qty 1

## 2017-11-22 MED ORDER — LORAZEPAM 2 MG/ML IJ SOLN: 0.0000 mg | Freq: Four times a day (QID) | INTRAMUSCULAR | Status: DC

## 2017-11-22 MED ORDER — INSULIN ASPART 100 UNIT/ML ~~LOC~~ SOLN
0.0000 [IU] | Freq: Three times a day (TID) | SUBCUTANEOUS | Status: DC
  Administered 2017-11-22: 11 [IU] via SUBCUTANEOUS
  Administered 2017-11-22: 8 [IU] via SUBCUTANEOUS
  Filled 2017-11-22 (×2): qty 1

## 2017-11-22 MED ORDER — ALUM & MAG HYDROXIDE-SIMETH 200-200-20 MG/5ML PO SUSP: 30.0000 mL | ORAL | Status: DC | PRN

## 2017-11-22 MED ORDER — HYDROCHLOROTHIAZIDE 25 MG PO TABS
25.0000 mg | ORAL_TABLET | Freq: Every day | ORAL | Status: DC
  Administered 2017-11-23 – 2017-11-25 (×3): 25 mg via ORAL
  Filled 2017-11-22 (×6): qty 1

## 2017-11-22 MED ORDER — VITAMIN B-1 100 MG PO TABS
100.0000 mg | ORAL_TABLET | Freq: Every day | ORAL | Status: DC
  Administered 2017-11-23 – 2017-11-26 (×4): 100 mg via ORAL
  Filled 2017-11-22 (×7): qty 1

## 2017-11-22 MED ORDER — CLONIDINE HCL 0.2 MG PO TABS
0.1000 mg | ORAL_TABLET | Freq: Once | ORAL | Status: AC
  Administered 2017-11-22: 0.1 mg via ORAL
  Filled 2017-11-22: qty 1

## 2017-11-22 MED ORDER — AMLODIPINE BESYLATE 5 MG PO TABS
10.0000 mg | ORAL_TABLET | Freq: Every day | ORAL | Status: DC
  Administered 2017-11-23 – 2017-11-26 (×4): 10 mg via ORAL
  Filled 2017-11-22 (×4): qty 1
  Filled 2017-11-22: qty 2
  Filled 2017-11-22 (×2): qty 1

## 2017-11-22 MED ORDER — LORAZEPAM 2 MG/ML IJ SOLN: 0.0000 mg | Freq: Two times a day (BID) | INTRAMUSCULAR | Status: DC

## 2017-11-22 MED ORDER — VITAMIN B-1 100 MG PO TABS
100.0000 mg | ORAL_TABLET | Freq: Every day | ORAL | Status: DC
  Administered 2017-11-22: 100 mg via ORAL
  Filled 2017-11-22: qty 1

## 2017-11-22 MED ORDER — PALIPERIDONE PALMITATE 156 MG/ML IM SUSP: 156.0000 mg | INTRAMUSCULAR | Status: DC

## 2017-11-22 MED ORDER — NICOTINE POLACRILEX 2 MG MT GUM
2.0000 mg | CHEWING_GUM | OROMUCOSAL | Status: DC | PRN
  Administered 2017-11-26 (×2): 2 mg via ORAL

## 2017-11-22 MED ORDER — HYDROCHLOROTHIAZIDE 25 MG PO TABS
25.0000 mg | ORAL_TABLET | Freq: Every day | ORAL | Status: DC
  Administered 2017-11-22: 25 mg via ORAL
  Filled 2017-11-22: qty 1

## 2017-11-22 MED ORDER — LORAZEPAM 1 MG PO TABS
0.0000 mg | ORAL_TABLET | Freq: Four times a day (QID) | ORAL | Status: DC
  Administered 2017-11-22: 2 mg via ORAL
  Filled 2017-11-22: qty 2

## 2017-11-22 MED ORDER — LORAZEPAM 1 MG PO TABS
1.0000 mg | ORAL_TABLET | Freq: Two times a day (BID) | ORAL | Status: DC
  Administered 2017-11-25: 1 mg via ORAL
  Filled 2017-11-22 (×2): qty 1

## 2017-11-22 MED ORDER — LORAZEPAM 1 MG PO TABS: 1.0000 mg | ORAL_TABLET | Freq: Every day | ORAL | Status: DC

## 2017-11-22 MED ORDER — IBUPROFEN 400 MG PO TABS
400.0000 mg | ORAL_TABLET | Freq: Once | ORAL | Status: AC
  Administered 2017-11-22: 400 mg via ORAL
  Filled 2017-11-22: qty 1

## 2017-11-22 MED ORDER — LORAZEPAM 1 MG PO TABS
1.0000 mg | ORAL_TABLET | Freq: Four times a day (QID) | ORAL | Status: AC | PRN
  Filled 2017-11-22: qty 1

## 2017-11-22 MED ORDER — DULOXETINE HCL 30 MG PO CPEP
30.0000 mg | ORAL_CAPSULE | Freq: Two times a day (BID) | ORAL | Status: DC
  Administered 2017-11-22: 30 mg via ORAL
  Filled 2017-11-22: qty 1

## 2017-11-22 MED ORDER — MAGNESIUM HYDROXIDE 400 MG/5ML PO SUSP: 30.0000 mL | Freq: Every day | ORAL | Status: DC | PRN

## 2017-11-22 MED ORDER — ONDANSETRON 4 MG PO TBDP: 4.0000 mg | ORAL_TABLET | Freq: Four times a day (QID) | ORAL | Status: AC | PRN

## 2017-11-22 MED ORDER — LORAZEPAM 1 MG PO TABS: 0.0000 mg | ORAL_TABLET | Freq: Four times a day (QID) | ORAL | Status: DC

## 2017-11-22 MED ORDER — LORAZEPAM 1 MG PO TABS
1.0000 mg | ORAL_TABLET | Freq: Three times a day (TID) | ORAL | Status: AC
  Administered 2017-11-24 – 2017-11-25 (×3): 1 mg via ORAL
  Filled 2017-11-22 (×3): qty 1

## 2017-11-22 MED ORDER — ACETAMINOPHEN 325 MG PO TABS
650.0000 mg | ORAL_TABLET | Freq: Four times a day (QID) | ORAL | Status: DC | PRN
  Administered 2017-11-23: 650 mg via ORAL
  Filled 2017-11-22: qty 2

## 2017-11-22 MED ORDER — DULOXETINE HCL 30 MG PO CPEP
30.0000 mg | ORAL_CAPSULE | Freq: Two times a day (BID) | ORAL | Status: DC
  Administered 2017-11-22 – 2017-11-23 (×2): 30 mg via ORAL
  Filled 2017-11-22 (×8): qty 1

## 2017-11-22 MED ORDER — HYDROXYZINE HCL 25 MG PO TABS: 25.0000 mg | ORAL_TABLET | Freq: Four times a day (QID) | ORAL | Status: AC | PRN

## 2017-11-22 MED ORDER — METFORMIN HCL 500 MG PO TABS
500.0000 mg | ORAL_TABLET | Freq: Two times a day (BID) | ORAL | Status: DC
  Administered 2017-11-23 – 2017-11-26 (×8): 500 mg via ORAL
  Filled 2017-11-22 (×13): qty 1

## 2017-11-22 MED ORDER — AMLODIPINE BESYLATE 5 MG PO TABS
10.0000 mg | ORAL_TABLET | Freq: Every day | ORAL | Status: DC
  Administered 2017-11-22: 10 mg via ORAL
  Filled 2017-11-22: qty 2

## 2017-11-22 MED ORDER — LORAZEPAM 1 MG PO TABS
1.0000 mg | ORAL_TABLET | Freq: Four times a day (QID) | ORAL | Status: AC
  Administered 2017-11-22 – 2017-11-24 (×6): 1 mg via ORAL
  Filled 2017-11-22 (×5): qty 1

## 2017-11-22 MED ORDER — CLONIDINE HCL 0.1 MG PO TABS
0.1000 mg | ORAL_TABLET | Freq: Once | ORAL | Status: AC
  Administered 2017-11-22: 0.1 mg via ORAL
  Filled 2017-11-22 (×2): qty 1

## 2017-11-22 NOTE — Progress Notes (Addendum)
Patient meets criteria for inpatient treatment. No appropriate beds available at [facility] CSW faxed referrals in EPIC to the following facilities for review:  [place], [place], [place], [place], [place], and [facility].   TTS will continue to seek bed placement.   Maxie Better, MSW, LCSW Clinical Social Worker 11/22/2017 10:53 AM

## 2017-11-22 NOTE — ED Notes (Signed)
Returned from Visual merchandiser w/pt.

## 2017-11-22 NOTE — ED Notes (Signed)
Pt ambulatory to shower w/Sitter. Will check CBG when lunch arrives.
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
## 2017-11-22 NOTE — ED Notes (Signed)
Will recheck blood pressure to see if pt will be able to be transferred to [facility].

## 2017-11-22 NOTE — Progress Notes (Signed)
Admission Note  D) Patient admitted to the adult unit. Patient is a 57 year old male who is Voluntary and was in no acute distress. Patient presents with flat affect and depressed mood but was pleasant and cooperative during the admission process. Patient reports he is currently homeless and abusing alcohol and cocaine. Patient reports passive SI thoughts and hearing voices to "hurt himself". At this time patient denies HI/Visual hallucinations. Patient denies pain. Patient is appropriate at this time and contracts for safety on the unit. Patient denies allergies to food/medicine. Patient reports past medical history of Diabetes Mellitus and Hypertension. Patient reports he has been off his medications for months due to financial issues. While here, patient reports wanting to work on "stopping drinking" and "focusing, paying better attention". Patient CIWA score currently 5.  A) Skin assessment was completed and unremarkable. Patient belongings searched with no contraband found. Belongings in locker #55. Plan of care, unit policies and patient expectations were explained. Patient receptive to information given with no questions. Patient verbalized understanding and contracted for safety on the unit. Written consents obtained. Snacks and fluids provided, meal tray offered. Patient oriented to the unit and their room. Patient placed on standard q15 safety checks. High fall risk precautions initiated and reviewed with patient; patient verbalized understanding.  R) Patient is in no acute distress. Patient remains safe on the unit at this time. Patient without questions or concerns at this time. Will continue to monitor.

## 2017-11-22 NOTE — ED Notes (Signed)
Called [facility] transportation for transport of pt to [facility]. States they will be here shortly.

## 2017-11-22 NOTE — ED Notes (Signed)
TTS being completed at this time. ?

## 2017-11-22 NOTE — ED Provider Notes (Signed)
16: 40-I was asked to see the patient regarding blood pressure which has become substantially higher since he arrived here.  This time the patient is alert, lying supine, drinking water occasionally.  He states that he feels tired and nervous.  He admits to using cocaine, and drinking alcohol.  He states he has not been taking his usual blood pressure med.  He states that currently he feels somewhat anxious.  On physical examination he is alert, and cooperative.  He appears to have sympathomimetic toxicity, evidenced by continually chewing, without food in his mouth.  At this time we will continue to monitor the patient, give Ativan as needed, and reassess.  He is here on his own volition for evaluation of substance abuse and suicidal ideation.  There has been no suggestion that he swallowed large amounts of cocaine.  Medical decision making-hypertension with cocaine abuse.  Suspect cocaine toxicity, possibly complicated by alcohol withdrawal and untreated hypertension.  Doubt hypertensive urgency at this time.  We will continue to observe and treat, before disposition inpatient to the behavioral health Hospital.   Daleen Bo, MD 11/22/17 [phone]

## 2017-11-22 NOTE — Tx Team (Signed)
Initial Treatment Plan 11/22/2017 11:22 PM [name] OTL:[id number]    PATIENT STRESSORS: Financial difficulties Health problems Medication change or noncompliance Substance abuse   PATIENT STRENGTHS: Curator fund of knowledge Motivation for treatment/growth   PATIENT IDENTIFIED PROBLEMS: "stop drinking"  "pay attention, focus"  "find somewhere to go"  Suicide Risk  Substance Abuse  Psychosis           DISCHARGE CRITERIA:  Ability to meet basic life and health needs Adequate post-discharge living arrangements Improved stabilization in mood, thinking, and/or behavior Medical problems require only outpatient monitoring Motivation to continue treatment in a less acute level of care Need for constant or close observation no longer present Reduction of life-threatening or endangering symptoms to within safe limits Safe-care adequate arrangements made Verbal commitment to aftercare and medication compliance Withdrawal symptoms are absent or subacute and managed without 24-hour nursing intervention  PRELIMINARY DISCHARGE PLAN: Outpatient therapy  PATIENT/FAMILY INVOLVEMENT: This treatment plan has been presented to and reviewed with the patient, [name].  The patient and family have been given the opportunity to ask questions and make suggestions.  Annia Friendly, RN 11/22/2017, 11:22 PM

## 2017-11-22 NOTE — ED Notes (Signed)
[name], [facility] - aware of delay w/pt being transported to [facility] - BP remains elevated. Advised will hold bed.

## 2017-11-22 NOTE — ED Notes (Signed)
Belfi, MD aware of LFTs, plan to admin sliding scale insulin, hx of elevated LFTs

## 2017-11-22 NOTE — Progress Notes (Signed)
Per [place], AC, pt has been accepted to [facility] bed 501-01. Accepting provider is Catalina Pizza NP, Attending provider is Dr. Sanjuana Letters MD. Patient can arrive by 3:30PM. Number for report is [phone]. Archivist at [employer] notified of above.   Maxie Better, MSW, LCSW Clinical Social Worker 11/22/2017 2:00 PM

## 2017-11-22 NOTE — ED Notes (Signed)
Spoke with Dr. Eulis Foster about pt and pts blood pressure. Currently 154/96 HR 84.  MD states that he is ok with pt being transferred to [facility] at this point and to call to see if they are ok with pt coming and his b/p.

## 2017-11-22 NOTE — ED Notes (Signed)
Called [facility] and spoke with Estill Bamberg, Therapist, sports. Report given and RN is ok with pt coming at this time. Pt aware he is to be transported to [facility] for treatment. Will obtain all paperwork and call for transport.

## 2017-11-22 NOTE — ED Provider Notes (Addendum)
[facility] EMERGENCY DEPARTMENT Provider Note   CSN: [id number] Arrival date & time: 11/22/17  7673     History   Chief Complaint Chief Complaint  Patient presents with  . Chest Pain  . Hallucinations    HPI Partick Musselman is a 57 y.o. male.  Patient is a 57 year old male with a history of hypertension, schizophrenia, diabetes, alcohol abuse and drug use who presents with homicidal hallucinations.  He states that he has not been taking his medications.  He states he has been out for about 6 months.  He states he has been drinking alcohol and using cocaine, most recently last night.  He states he has been having thoughts of wanting to kill people.  He denies any suicidal ideations.  He previously was reporting chest pain however to me he just says he is sore all over and denies any specific chest pain or shortness of breath.  He denies any other recent physical illnesses or complaints.      Past Medical History:  Diagnosis Date  . Alcohol abuse   . Arthritis   . Crack cocaine use   . Depression   . Diabetes mellitus   . Drug abuse ([place])   . DTs (delirium tremens) ([place])    history of   . Gout   . Noncompliance with medication regimen   . Schizophrenia ([place])   . Stroke ([place])   . Uncontrolled hypertension     Patient Active Problem List   Diagnosis Date Noted  . Trauma   . Tachycardia 03/03/2017  . Alcohol abuse   . Cocaine abuse ([place])   . Hypertensive urgency   . Tachycardia   . Cocaine abuse with cocaine-induced mood disorder ([place]) 12/05/2016  . Cerebrovascular disease 09/02/2015  . 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 Malignant hypertension 08/30/2015  . Alcohol use disorder, severe, dependence ([place]) 08/30/2015  . Alcohol withdrawal ([place]) 08/30/2015  . 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 Thrombocytopenia ([place])  08/27/2015  . Tobacco use disorder 08/26/2015  . Hyperlipidemia 04/28/2015  . Schizoaffective disorder, depressive type ([place])   . Gout 08/09/2013  . Diabetes mellitus ([place]) 07/09/2012  . Polysubstance abuse ([place]) 07/09/2012    Past Surgical History:  Procedure Laterality Date  . MANDIBLE RECONSTRUCTION    . TONSILLECTOMY         Home Medications    Prior to Admission medications   Medication Sig Start Date End Date Taking? Authorizing Provider  amLODipine (NORVASC) 10 MG tablet Take 1 tablet (10 mg total) by mouth daily. Patient not taking: Reported on 11/22/2017 03/06/17   Smiley Houseman, MD  cloNIDine (CATAPRES - DOSED IN MG/24 HR) 0.3 mg/24hr patch Place 1 patch (0.3 mg total) onto the skin once a week. Patient not taking: Reported on 11/22/2017 03/12/17   Smiley Houseman, MD  DULoxetine (CYMBALTA) 30 MG capsule Take 1 capsule (30 mg total) by mouth 2 (two) times daily. Patient not taking: Reported on 11/22/2017 01/09/17   Patrecia Pour, NP  hydrochlorothiazide (HYDRODIURIL) 25 MG tablet Take 1 tablet (25 mg total) by mouth daily. Patient not taking: Reported on 11/22/2017 03/06/17   Smiley Houseman, MD  hydrOXYzine (ATARAX/VISTARIL) 25 MG tablet Take 1 tablet (25 mg total) by mouth 3 (three) times daily as needed for anxiety. Patient not taking: Reported on 11/22/2017 01/09/17   Patrecia Pour, NP  meloxicam (MOBIC) 7.5 MG tablet Take  7.5 mg by mouth 2 (two) times daily as needed for pain.    [provider]  metFORMIN (GLUCOPHAGE) 500 MG tablet Take 1 tablet (500 mg total) by mouth 2 (two) times daily with a meal. Patient not taking: Reported on 11/22/2017 01/09/17   Patrecia Pour, NP  paliperidone (INVEGA SUSTENNA) 156 MG/ML SUSP injection Inject 156 mg into the muscle every 30 (thirty) days.    [provider]  tiZANidine (ZANAFLEX) 2 MG tablet Take 2-4 mg by mouth every 8 (eight) hours as needed for muscle spasms.    [provider]     Family History Family History  Problem Relation Age of Onset  . Hypertension Unknown     Social History Social History   Tobacco Use  . Smoking status: Current Every Day Smoker    Packs/day: 0.25    Types: Cigarettes  . Smokeless tobacco: Never Used  Substance Use Topics  . Alcohol use: Yes    Alcohol/week: 0.6 - 1.2 oz    Types: 1 - 2 Cans of beer per week    Comment: [facility]  . Drug use: Yes    Types: "Crack" cocaine, Cocaine     Allergies   Patient has no known allergies.   Review of Systems Review of Systems  Constitutional: Negative for chills, diaphoresis, fatigue and fever.  HENT: Negative for congestion, rhinorrhea and sneezing.   Eyes: Negative.   Respiratory: Negative for cough, chest tightness and shortness of breath.   Cardiovascular: Negative for chest pain and leg swelling.  Gastrointestinal: Negative for abdominal pain, blood in stool, diarrhea, nausea and vomiting.  Genitourinary: Negative for difficulty urinating, flank pain, frequency and hematuria.  Musculoskeletal: Positive for myalgias. Negative for arthralgias and back pain.  Skin: Negative for rash.  Neurological: Negative for dizziness, speech difficulty, weakness, numbness and headaches.  Psychiatric/Behavioral: Negative for suicidal ideas.       Homicidal ideations     Physical Exam Updated Vital Signs BP (!) 149/94   Pulse 94   Temp 97.8 F (36.6 C) (Oral)   Resp 18   Ht 5\' 6"  (1.676 m)   Wt 86.2 kg (190 lb)   SpO2 95%   BMI 30.67 kg/m   Physical Exam  Constitutional: He is oriented to person, place, and time. He appears well-developed and well-nourished.  HENT:  Head: Normocephalic and atraumatic.  Eyes: Pupils are equal, round, and reactive to light.  Neck: Normal range of motion. Neck supple.  Cardiovascular: Normal rate, regular rhythm and normal heart sounds.  Pulmonary/Chest: Effort normal and breath sounds normal. No respiratory distress. He has no wheezes.  He has no rales. He exhibits no tenderness.  Abdominal: Soft. Bowel sounds are normal. There is no tenderness. There is no rebound and no guarding.  Musculoskeletal: Normal range of motion. He exhibits no edema.  Lymphadenopathy:    He has no cervical adenopathy.  Neurological: He is alert and oriented to person, place, and time.  Skin: Skin is warm and dry. No rash noted.  Psychiatric: His affect is labile.     ED Treatments / Results  Labs (all labs ordered are listed, but only abnormal results are displayed) Labs Reviewed  CBC - Abnormal; Notable for the following components:      Result Value   Platelets 124 (*)    All other components within normal limits  ETHANOL - Abnormal; Notable for the following components:   Alcohol, Ethyl (B) 190 (*)    All other  components within normal limits  COMPREHENSIVE METABOLIC PANEL - Abnormal; Notable for the following components:   Sodium 134 (*)    Potassium 3.1 (*)    Chloride 98 (*)    Glucose, Bld 295 (*)    AST 200 (*)    ALT 108 (*)    All other components within normal limits  ACETAMINOPHEN LEVEL - Abnormal; Notable for the following components:   Acetaminophen (Tylenol), Serum <10 (*)    All other components within normal limits  RAPID URINE DRUG SCREEN, HOSP PERFORMED - Abnormal; Notable for the following components:   Cocaine POSITIVE (*)    All other components within normal limits  CBG MONITORING, ED - Abnormal; Notable for the following components:   Glucose-Capillary 301 (*)    All other components within normal limits  SALICYLATE LEVEL  HEPATITIS PANEL, ACUTE  I-STAT TROPONIN, ED    EKG  EKG Interpretation  Date/Time:  Saturday November 22 2017 03:43:38 EST Ventricular Rate:  86 PR Interval:  202 QRS Duration: 110 QT Interval:  376 QTC Calculation: 449 R Axis:   -47 Text Interpretation:  Normal sinus rhythm Biatrial enlargement Pulmonary disease pattern Left anterior fascicular block Nonspecific T wave  abnormality Abnormal ECG since last tracing no significant change Confirmed by Malvin Johns [id number]) on 11/22/2017 7:14:02 AM       Radiology Dg Chest 2 View  Result Date: 11/22/2017 CLINICAL DATA:  Chest pain for 2 weeks. EXAM: CHEST  2 VIEW COMPARISON:  02/10/2017 FINDINGS: Normal mediastinal contours with upper normal heart size. The lungs are clear. Pulmonary vasculature is normal. No consolidation, pleural effusion, or pneumothorax. No acute osseous abnormalities are seen. Chronic right AC joint separation. IMPRESSION: Borderline cardiomegaly without acute abnormality. Electronically Signed   By: Jeb Levering M.D.   On: 11/22/2017 04:11    Procedures Procedures (including critical care time)  Medications Ordered in ED Medications  amLODipine (NORVASC) tablet 10 mg (10 mg Oral Given 11/22/17 1018)  DULoxetine (CYMBALTA) DR capsule 30 mg (30 mg Oral Given 11/22/17 1019)  hydrochlorothiazide (HYDRODIURIL) tablet 25 mg (25 mg Oral Given 11/22/17 1019)  insulin aspart (novoLOG) injection 0-15 Units (11 Units Subcutaneous Given 11/22/17 0858)  LORazepam (ATIVAN) injection 0-4 mg (0 mg Intravenous Not Given 11/22/17 1028)    Or  LORazepam (ATIVAN) tablet 0-4 mg ( Oral See Alternative 11/22/17 1028)  LORazepam (ATIVAN) injection 0-4 mg (not administered)    Or  LORazepam (ATIVAN) tablet 0-4 mg (not administered)  thiamine (VITAMIN B-1) tablet 100 mg (100 mg Oral Given 11/22/17 1019)    Or  thiamine (B-1) injection 100 mg ( Intravenous See Alternative 11/22/17 1019)  ibuprofen (ADVIL,MOTRIN) tablet 400 mg (400 mg Oral Given 11/22/17 1047)     Initial Impression / Assessment and Plan / ED Course  I have reviewed the triage vital signs and the nursing notes.  Pertinent labs & imaging results that were available during my care of the patient were reviewed by me and considered in my medical decision making (see chart for details).     Patient is a 57 year old male who presents with homicidal  ideations and polysubstance use.  His alcohol level is 190.  His drug screen is positive for cocaine.  His blood pressure has improved since arrival.  He is currently otherwise asymptomatic.  His LFTs are elevated but he has had elevation of his LFTs in the past in the 100 range.  I did add a hepatitis screen.  His Tylenol level is negative.  He does not have any elevation in his bilirubin.  There is no jaundice.  No ascites.  This is likely related to his alcohol use.  I did however hold his Glucophage which can be hepatotoxic and started him on sliding scale insulin.  TTS consult pending.  Will need outpatient follow up on his elevated liver enzymes.  He is medically cleared for psych evaluation.  Final Clinical Impressions(s) / ED Diagnoses   Final diagnoses:  Hallucinations  Elevated liver enzymes    ED Discharge Orders    None       Malvin Johns, MD 11/22/17 8441    Malvin Johns, MD 11/22/17 1054

## 2017-11-22 NOTE — ED Notes (Signed)
Per staffing no sitter til 7AM. Belongings removed, pt placed in paper scrubs and wanded by security.

## 2017-11-22 NOTE — Progress Notes (Signed)
Patient blood pressure elevated at this time. Manually obtained, bp is 160/110. Patient denies headache, chest pain, or dizziness. Patient states, "it's always like this". NP Lindon Romp notified and new orders received. Patient medicated with one time order Clonidine 0.1 mg and 1 mg Ativan. CBG obtained and 110. Will reassess bp and monitor for changes in condition.

## 2017-11-22 NOTE — ED Notes (Signed)
Dr Eulis Foster aware pt has bed available at [facility] and pt's BP 193/121. Ativan 2mg  given as ordered.

## 2017-11-22 NOTE — ED Notes (Signed)
Doris, RN - [facility] - requested for pt to arrive after 1600. Advised OK and advised of pt's BP and obtaining meds.

## 2017-11-22 NOTE — ED Triage Notes (Signed)
BIB EMS reporting CP X2 weeks, pt also states he is seeing and hearing voices, pt has hx of bipolar and schizophrenia and has been off of his meds (psych, BP, diabetes) for past several days. Pt states he is SI and HI towards his roommates at the halfway house he lives in. Hypertensive in triage.

## 2017-11-22 NOTE — ED Notes (Signed)
Pt ambulatory to F7 - wearing burgundy paper scrubs - alert, oriented, calm, cooperative. Sitter w/pt. Lunch ordered for pt.

## 2017-11-22 NOTE — ED Notes (Addendum)
Pt voiced understanding and agreement w/tx plan - accepted to [facility] - signed consent forms - copy faxed to [facility], copy sent to Medical Records, and original placed in folder for [facility]. Pt declined to call family/friend to notify of tx plan.

## 2017-11-23 DIAGNOSIS — F1010 Alcohol abuse, uncomplicated: Secondary | ICD-10-CM

## 2017-11-23 DIAGNOSIS — F1410 Cocaine abuse, uncomplicated: Secondary | ICD-10-CM

## 2017-11-23 DIAGNOSIS — F419 Anxiety disorder, unspecified: Secondary | ICD-10-CM

## 2017-11-23 DIAGNOSIS — G4700 Insomnia, unspecified: Secondary | ICD-10-CM

## 2017-11-23 DIAGNOSIS — F17210 Nicotine dependence, cigarettes, uncomplicated: Secondary | ICD-10-CM

## 2017-11-23 DIAGNOSIS — R450 Nervousness: Secondary | ICD-10-CM

## 2017-11-23 DIAGNOSIS — F250 Schizoaffective disorder, bipolar type: Principal | ICD-10-CM

## 2017-11-23 LAB — GLUCOSE, CAPILLARY
GLUCOSE-CAPILLARY: 225 mg/dL — AB (ref 65–99)
Glucose-Capillary: 272 mg/dL — ABNORMAL HIGH (ref 65–99)
Glucose-Capillary: 250 mg/dL — ABNORMAL HIGH (ref 65–99)

## 2017-11-23 MED ORDER — RISPERIDONE 0.5 MG PO TABS
0.5000 mg | ORAL_TABLET | Freq: Two times a day (BID) | ORAL | Status: DC
  Administered 2017-11-23 – 2017-11-24 (×2): 0.5 mg via ORAL
  Filled 2017-11-23 (×6): qty 1

## 2017-11-23 MED ORDER — CLONIDINE HCL 0.1 MG PO TABS
0.1000 mg | ORAL_TABLET | Freq: Once | ORAL | Status: AC
  Filled 2017-11-23: qty 1

## 2017-11-23 MED ORDER — CLONIDINE HCL 0.1 MG PO TABS
ORAL_TABLET | ORAL | Status: AC
  Administered 2017-11-23: 0.1 mg
  Filled 2017-11-23: qty 1

## 2017-11-23 MED ORDER — POTASSIUM CHLORIDE CRYS ER 20 MEQ PO TBCR
20.0000 meq | EXTENDED_RELEASE_TABLET | Freq: Every day | ORAL | Status: DC
  Administered 2017-11-23 – 2017-11-26 (×4): 20 meq via ORAL
  Filled 2017-11-23 (×7): qty 1

## 2017-11-23 MED ORDER — LABETALOL HCL 100 MG PO TABS
100.0000 mg | ORAL_TABLET | Freq: Once | ORAL | Status: AC
  Filled 2017-11-23: qty 1

## 2017-11-23 MED ORDER — LABETALOL HCL 100 MG PO TABS
ORAL_TABLET | ORAL | Status: AC
  Administered 2017-11-23: 100 mg
  Filled 2017-11-23: qty 1

## 2017-11-23 MED ORDER — CLONIDINE HCL 0.1 MG PO TABS
0.1000 mg | ORAL_TABLET | Freq: Once | ORAL | Status: AC
  Administered 2017-11-23: 0.1 mg via ORAL
  Filled 2017-11-23 (×2): qty 1

## 2017-11-23 MED ORDER — GABAPENTIN 100 MG PO CAPS
200.0000 mg | ORAL_CAPSULE | Freq: Three times a day (TID) | ORAL | Status: DC
  Administered 2017-11-23 – 2017-11-26 (×13): 200 mg via ORAL
  Filled 2017-11-23 (×24): qty 2

## 2017-11-23 NOTE — BH Assessment (Signed)
Nursing Progress Note: 7p-7a D: Pt currently presents with a depressed/sad/helpless/hopeless affect and behavior. Pt states "I'm hearing voices and seeing shit. I just want to go be gone from here." Not interacting with the milieu. Pt reports poor sleep during the previous night with current medication regimen. Pt did not attend wrap-up group.  A: Pt provided with medications per providers orders. [name] labs and vitals were monitored throughout the night. Pt supported emotionally and encouraged to express concerns and questions. Pt educated on medications.  R: [name] safety ensured with 15 minute and environmental checks. Pt currently denies HI and endorses passive SI and AVH. Pt verbally contracts to seek staff if SI,HI, or AVH occurs and to consult with staff before acting on any harmful thoughts. Will continue to monitor.

## 2017-11-23 NOTE — B[facility] Group Notes (Signed)
[place] LCSW Group Therapy Note  Date/Time:  11/23/2017  11:00AM-12:00PM  Type of Therapy and Topic:  Group Therapy:  Music and Mood  Participation Level:  Did Not Attend   Description of Group: In this process group, members listened to a variety of genres of music and identified that different types of music evoke different responses.  Patients were encouraged to identify music that was soothing for them and music that was energizing for them.  Patients discussed how this knowledge can help with wellness and recovery in various ways including managing depression and anxiety as well as encouraging healthy sleep habits.    Therapeutic Goals: 1. Patients will explore the impact of different varieties of music on mood 2. Patients will verbalize the thoughts they have when listening to different types of music 3. Patients will identify music that is soothing to them as well as music that is energizing to them 4. Patients will discuss how to use this knowledge to assist in maintaining wellness and recovery 5. Patients will explore the use of music as a coping skill  Summary of Patient Progress:  N/A  Therapeutic Modalities: Solution Focused Brief Therapy Activity   [name], LCSW

## 2017-11-23 NOTE — Progress Notes (Signed)
Patient has been resting in bed since afternoon snack at 1415. BP obtained via dinamap with elevated reading. Obtained manually at 1710 and is 188/122. Patient denies physical complaints other than a mild headache (3/10.) Myrle Sheng, NP immediately notified and orders received. Patient medicated at 1717 with labetelol 100mg  and clonidine 0.1mg  po. Will recheck at the 1 hour mark.

## 2017-11-23 NOTE — Progress Notes (Signed)
Patient observed sitting in bed, eating dinner over the last hour. Taking in fluids without difficulty. NAD observed. Manual BP rechecked and improved - 160/96. Bobby Rumpf, NP notified. Order received for AM EKG. Patient presently resting on R side. Complains of mild headache but refuses prn. Will continue to monitor closely.

## 2017-11-23 NOTE — H&P (Signed)
Psychiatric Admission Assessment Adult  Patient Identification: [name]  MRN:  [id number]  Date of Evaluation:  11/23/2017  Chief Complaint: Increased alcohol/drug use & auditory hallucinations telling him to kill people.  Principal Diagnosis: Schizoaffective disorder, bipolar type ([place]), Alcohol use disorder, severe, dependence  Diagnosis:   Patient Active Problem List   Diagnosis Date Noted  . Schizoaffective disorder, bipolar type ([place]) [F25.0] 11/22/2017    Priority: High  . Alcohol use disorder, severe, dependence ([place]) [F10.20] 08/30/2015    Priority: Medium  . Trauma [T14.90XA]   . Tachycardia [R00.0] 03/03/2017  . Alcohol abuse [F10.10]   . Cocaine abuse ([place]) [F14.10]   . Hypertensive urgency [I16.0]   . Tachycardia [R00.0]   . Cocaine abuse with cocaine-induced mood disorder ([place]) [F14.14] 12/05/2016  . Cerebrovascular disease [I67.9] 09/02/2015  . 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 Malignant hypertension [I10] 08/30/2015  . Alcohol withdrawal ([place]) [F10.239] 08/30/2015  . 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 Thrombocytopenia ([place]) [D69.6] 08/27/2015  . Tobacco use disorder [F17.200] 08/26/2015  . Hyperlipidemia [E78.5] 04/28/2015  . Schizoaffective disorder, depressive type ([place]) [F25.1]   . Gout [M10.9] 08/09/2013  . Diabetes mellitus ([place]) [E11.9] 07/09/2012  . Polysubstance abuse [facility]) [F19.10] 07/09/2012   History of Present Illness: This is an admission assessment for this 57 year old male with hx of mental illness, chronic & polysubstance use disorder. He also has chronic medical illnesses. Admitted to the [facility] adult unit from the [facility] with complain of increased drug/alcohol use & auditory hallucinations telling him to kill other people. His Bal on admission was 190 & UDS  positive for Cocaine. He was in need of substance abuse & mood stabilization treatments.  During this assessment, [name] reports, "The ambulance took me to the [facility] 2 days ago, I think  I called 911 because I was hearing voices telling me to go kill other people. This has been going on for months, but, the voices became stronger & louder. I was also drinking a lot of beer & smoking crack when this was going on. I have been drinking heavily & smoking crack everyday for 4 months. I have not been taking my medicines because I got to be feeling frustrated taking them. It has been 5 months since I have taken my medicines. I don't remember what I was taking. I was diagnosed with Schizophrenia 4 years ago. I was going to [facility] for my medicines, but, I have not been there in 5 months. I need help with drugs & alcohol because it is getting bad".  Associated Signs/Symptoms:  Depression Symptoms:  depressed mood, anxiety, Suicidal/homicidal ideations.  (Hypo) Manic Symptoms:  Hallucinations, Impulsivity, Labiality of Mood,  Anxiety Symptoms:  Excessive Worry,  Psychotic Symptoms:  Hallucinations: Auditory  PTSD Symptoms: Denies any PTSD symptoms or intent.  Total Time spent with patient: 1 hour  Past Psychiatric History: Schizophrenia, Bipolar disorder.  Is the patient at risk to self? No.  Has the patient been a risk to self in the past 6 months? No.  Has the patient been a risk to self within the distant past? No.  Is the patient a risk to others? Yes.    Has the patient been a risk to others in the past 6 months? No.  Has the patient been a risk to others within the distant past? No.   Prior Inpatient Therapy:  Yes, multiple time at different hospital.  Prior Outpatient Therapy: Yes, "I was going to [facility], then stopped"  Alcohol Screening: 1. How often do you have a drink containing alcohol?: 2 to 3 times a week 2. How many drinks containing alcohol do you have on  a typical day when you are drinking?: 5 or 6 3. How often do you have six or more drinks on one occasion?: Monthly AUDIT-C Score: 7 4. How often during the last year have you found that you were not able to stop drinking once you had started?: Monthly 5. How often during the last year have you failed to do what was normally expected from you becasue of drinking?: Monthly 6. How often during the last year have you needed a first drink in the morning to get yourself going after a heavy drinking session?: Less than monthly 7. How often during the last year have you had a feeling of guilt of remorse after drinking?: Daily or almost daily 8. How often during the last year have you been unable to remember what happened the night before because you had been drinking?: Less than monthly 9. Have you or someone else been injured as a result of your drinking?: Yes, but not in the last year 10. Has a relative or friend or a doctor or another health worker been concerned about your drinking or suggested you cut down?: Yes, during the last year Alcohol Use Disorder Identification Test Final Score (AUDIT): 23 Intervention/Follow-up: Alcohol Education  Substance Abuse History in the last 12 months:  Yes.    Consequences of Substance Abuse: Medical Consequences:  Liver damage, Possible death by overdose Legal Consequences:  Arrests, jail time, Loss of driving privilege. Family Consequences:  Family discord, divorce and or separation.  Previous Psychotropic Medications: Yes, ("I can't remember the names of the medicines".  Psychological Evaluations: No   Past Medical History:  Past Medical History:  Diagnosis Date  . Alcohol abuse   . Arthritis   . Crack cocaine use   . Depression   . Diabetes mellitus   . Drug abuse ([place])   . DTs (delirium tremens) ([place])    history of   . Gout   . Noncompliance with medication regimen   . Schizophrenia ([place])   . Stroke ([place])   . Uncontrolled hypertension      Past Surgical History:  Procedure Laterality Date  . MANDIBLE RECONSTRUCTION    . TONSILLECTOMY     Family History:  Family History  Problem Relation Age of Onset  . Hypertension Unknown    Family Psychiatric  History: Denies any known familial hx of mental illness.  Tobacco Screening: Have you used any form of tobacco in the last 30 days? (Cigarettes, Smokeless Tobacco, Cigars, and/or Pipes): Yes Tobacco use, Select all that apply: 5 or more cigarettes per day Are you interested in Tobacco Cessation Medications?: Yes, will notify MD for an order Counseled patient on smoking cessation including recognizing danger situations, developing coping skills and basic information about quitting provided: Refused/Declined practical counseling  Social History:  Social History   Substance and Sexual Activity  Alcohol Use Yes  . Alcohol/week: 0.6 - 1.2 oz  . Types: 1 - 2 Cans of beer per week   Comment: [facility]     Social History   Substance and Sexual Activity  Drug Use Yes  . Types: "Crack" cocaine, Cocaine    Additional Social History: Separated, homeless, on SSI, has 7 children.  Allergies:  No Known Allergies  Lab Results:  Results for orders placed or performed during the hospital encounter of 11/22/17 (from the past 48 hour(s))  Glucose, capillary     Status: Abnormal   Collection Time: 11/22/17 10:34 PM  Result Value Ref Range   Glucose-Capillary 110 (H) 65 - 99 mg/dL  Glucose, capillary     Status: Abnormal   Collection Time: 11/23/17  6:21 AM  Result Value Ref Range   Glucose-Capillary 225 (H) 65 - 99 mg/dL   Blood Alcohol level:  Lab Results  Component Value Date   ETH 190 (H) 11/22/2017   ETH <5 33/29/5188   Metabolic Disorder Labs:  Lab Results  Component Value Date   HGBA1C 11.5 (H) 03/03/2017   MPG 283 03/03/2017   MPG 140 04/28/2015   Lab Results  Component Value Date   PROLACTIN 47.8 (H) 04/28/2015   Lab Results  Component Value Date    CHOL 120 08/29/2015   TRIG 339 (H) 08/29/2015   HDL 37 (L) 08/29/2015   CHOLHDL 3.2 08/29/2015   VLDL 68 (H) 08/29/2015   LDLCALC 15 08/29/2015   LDLCALC 24 04/28/2015   Current Medications: Current Facility-Administered Medications  Medication Dose Route Frequency Provider Last Rate Last Dose  . acetaminophen (TYLENOL) tablet 650 mg  650 mg Oral Q6H PRN Withrow, Elyse Jarvis, FNP      . alum & mag hydroxide-simeth (MAALOX/MYLANTA) 200-200-20 MG/5ML suspension 30 mL  30 mL Oral Q4H PRN Withrow, John C, FNP      . amLODipine (NORVASC) tablet 10 mg  10 mg Oral Daily [name], FNP   10 mg at 11/23/17 0810  . DULoxetine (CYMBALTA) DR capsule 30 mg  30 mg Oral BID [name], FNP   30 mg at 11/23/17 0809  . hydrochlorothiazide (HYDRODIURIL) tablet 25 mg  25 mg Oral Daily [name], FNP   25 mg at 11/23/17 0809  . hydrOXYzine (ATARAX/VISTARIL) tablet 25 mg  25 mg Oral Q6H PRN Lindon Romp A, NP      . insulin aspart (novoLOG) injection 0-15 Units  0-15 Units Subcutaneous TID WC [name], FNP   5 Units at 11/23/17 [phone]  . loperamide (IMODIUM) capsule 2-4 mg  2-4 mg Oral PRN Lindon Romp A, NP      . LORazepam (ATIVAN) tablet 1 mg  1 mg Oral Q6H PRN Lindon Romp A, NP      . LORazepam (ATIVAN) tablet 1 mg  1 mg Oral QID Lindon Romp A, NP   1 mg at 11/23/17 0809   Followed by  . [START ON 11/24/2017] LORazepam (ATIVAN) tablet 1 mg  1 mg Oral TID Rozetta Nunnery, NP       Followed by  . [START ON 11/25/2017] LORazepam (ATIVAN) tablet 1 mg  1 mg Oral BID Rozetta Nunnery, NP       Followed by  . [START ON 11/27/2017] LORazepam (ATIVAN) tablet 1 mg  1 mg Oral Daily Lindon Romp A, NP      . magnesium hydroxide (MILK OF MAGNESIA) suspension 30 mL  30 mL Oral Daily PRN Withrow, John C, FNP      . metFORMIN (GLUCOPHAGE) tablet 500 mg  500 mg Oral BID WC Withrow, John C, FNP   500 mg at 11/23/17 0809  . nicotine polacrilex (NICORETTE) gum 2 mg  2 mg Oral PRN Pennelope Bracken, MD       . ondansetron (ZOFRAN-ODT) disintegrating tablet 4 mg  4 mg Oral Q6H PRN Lindon Romp A, NP      . thiamine (VITAMIN B-1) tablet 100 mg  100 mg Oral Daily Withrow, John C, FNP   100 mg at 11/23/17 6160   Or  . thiamine (B-1) injection 100 mg  100 mg Intravenous Daily Withrow, John C, FNP       PTA Medications: Medications Prior to Admission  Medication Sig Dispense Refill Last Dose  . amLODipine (NORVASC) 10 MG tablet Take 1 tablet (10 mg total) by mouth daily. (Patient not taking: Reported on 11/22/2017) 30 tablet 0 Not Taking at Unknown time  . cloNIDine (CATAPRES - DOSED IN MG/24 HR) 0.3 mg/24hr patch Place 1 patch (0.3 mg total) onto the skin once a week. (Patient not taking: Reported on 11/22/2017) 4 patch 0 Not Taking at Unknown time  . DULoxetine (CYMBALTA) 30 MG capsule Take 1 capsule (30 mg total) by mouth 2 (two) times daily. (Patient not taking: Reported on 11/22/2017) 60 capsule 0 Not Taking at Unknown time  . hydrochlorothiazide (HYDRODIURIL) 25 MG tablet Take 1 tablet (25 mg total) by mouth daily. (Patient not taking: Reported on 11/22/2017) 30 tablet 0 Not Taking at Unknown time  . hydrOXYzine (ATARAX/VISTARIL) 25 MG tablet Take 1 tablet (25 mg total) by mouth 3 (three) times daily as needed for anxiety. (Patient not taking: Reported on 11/22/2017) 30 tablet 0 Not Taking at Unknown time  . meloxicam (MOBIC) 7.5 MG tablet Take 7.5 mg by mouth 2 (two) times daily as needed for pain.   Not Taking at Unknown time  . metFORMIN (GLUCOPHAGE) 500 MG tablet Take 1 tablet (500 mg total) by mouth 2 (two) times daily with a meal. (Patient not taking: Reported on 11/22/2017) 60 tablet 0 Not Taking at Unknown time  . paliperidone (INVEGA SUSTENNA) 156 MG/ML SUSP injection Inject 156 mg into the muscle every 30 (thirty) days.   Not Taking at Unknown time  . tiZANidine (ZANAFLEX) 2 MG tablet Take 2-4 mg by mouth every 8 (eight) hours as needed for muscle spasms.   Not Taking at Unknown time    Musculoskeletal: Strength & Muscle Tone: within normal limits Gait & Station: normal Patient leans: N/A  Psychiatric Specialty Exam: Physical Exam  Constitutional: He appears well-developed.  HENT:  Head: Normocephalic.  Eyes: Pupils are equal, round, and reactive to light.  Neck: Normal range of motion.  Cardiovascular:  Hx. HTN  Respiratory: Effort normal.  GI: Soft.  Genitourinary:  Genitourinary Comments: Deferred  Musculoskeletal: Normal range of motion.  Neurological: He is alert.  Skin: Skin is warm.    Review of Systems  Constitutional: Positive for malaise/fatigue.  HENT: Negative.   Eyes: Negative.   Respiratory: Negative.   Cardiovascular: Negative.   Gastrointestinal: Negative.   Genitourinary: Negative.   Musculoskeletal: Negative.   Skin: Negative.   Neurological: Negative.   Endo/Heme/Allergies: Negative.   Psychiatric/Behavioral: Positive for depression, hallucinations and substance abuse (Bal 190, UDS (+) for THC). Negative for memory loss. The patient is nervous/anxious and has insomnia.     Blood pressure (!) 163/109, pulse 81, temperature 98.4 F (36.9 C), temperature source Oral, resp. rate 18, height 5\' 6"  (1.676 m), weight 86 kg (189 lb 9.5 oz), SpO2 99 %.Body mass index is 30.6 kg/m.  General Appearance: Disheveled  Eye Contact:  Fair  Speech:  Clear and Coherent and Slow  Volume:  Decreased  Mood:  Anxious, Depressed and Hopeless  Affect:  Depressed and Flat  Thought Process:  Disorganized and Descriptions of Associations: Tangential  Orientation:  Full (Time, Place, and Person)  Thought Content:  Hallucinations: Auditory, Rumination and Tangential  Suicidal Thoughts:  Currently denies any thoughts, plans or intent  Homicidal Thoughts:  Denies  Memory:  Immediate;   Good Recent;   Good Remote;   Good  Judgement:  Impaired  Insight:  Shallow  Psychomotor Activity:  Decreased  Concentration:  Concentration: Fair and Attention Span:  Fair  Recall:  [employer] of Knowledge:  Fair  Language:  Good  Akathisia:  Negative  Handed:  Right  AIMS (if indicated):     Assets:  Communication Skills Desire for Improvement  ADL's:  Intact  Cognition:  WNL  Sleep:  Number of Hours: 5.75   Treatment Plan Summary: Daily contact with patient to assess and evaluate symptoms and progress in treatment: See MAR, Md's SRA & treatment plan.  Observation Level/Precautions:  15 minute checks  Laboratory:  Per ED, BAL 190, UDS (+) for Cocaine  Psychotherapy: Group sessions  Medications: See MAR  Consultations: As needed  Discharge Concerns: Safety, mood stability.  Estimated LOS: 5-7 days  Other: Admit to the [facility].    Physician Treatment Plan for Primary Diagnosis: Schizoaffective disorder, bipolar type ([place])  Long Term Goal(s): Improvement in symptoms so as ready for discharge  Short Term Goals: Ability to identify changes in lifestyle to reduce recurrence of condition will improve, Ability to verbalize feelings will improve and Ability to demonstrate self-control will improve  Physician Treatment Plan for Secondary Diagnosis: Principal Problem:   Schizoaffective disorder, bipolar type ([place]) Active Problems:   Alcohol use disorder, severe, dependence ([place])  Long Term Goal(s): Improvement in symptoms so as ready for discharge  Short Term Goals: Ability to identify and develop effective coping behaviors will improve, Compliance with prescribed medications will improve and Ability to identify triggers associated with substance abuse/mental health issues will improve  I certify that inpatient services furnished can reasonably be expected to improve the patient's condition.    Lindell Spar, NP, PMHNP, FNP-BC. 1/6/201911:15 AM

## 2017-11-23 NOTE — Plan of Care (Signed)
Patient verbalizes understanding of information, education provided. 

## 2017-11-23 NOTE — BHH Suicide Risk Assessment (Signed)
[facility] Admission Suicide Risk Assessment   Nursing information obtained from:  Patient, Review of record Demographic factors:  Low socioeconomic status, Unemployed Current Mental Status:  Suicidal ideation indicated by patient, Self-harm thoughts Loss Factors:  Financial problems / change in socioeconomic status, Decline in physical health Historical Factors:  Prior suicide attempts Risk Reduction Factors:  NA  Total Time spent with patient: 45 minutes Principal Problem: Schizoaffective disorder, bipolar type ([place]) Diagnosis:   Patient Active Problem List   Diagnosis Date Noted  . Schizoaffective disorder, bipolar type ([place]) [F25.0] 11/22/2017  . Trauma [T14.90XA]   . Tachycardia [R00.0] 03/03/2017  . Alcohol abuse [F10.10]   . Cocaine abuse ([place]) [F14.10]   . Hypertensive urgency [I16.0]   . Tachycardia [R00.0]   . Cocaine abuse with cocaine-induced mood disorder ([place]) [F14.14] 12/05/2016  . Cerebrovascular disease [I67.9] 09/02/2015  . 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 Malignant hypertension [I10] 08/30/2015  . Alcohol use disorder, severe, dependence ([place]) [F10.20] 08/30/2015  . Alcohol withdrawal ([place]) [F10.239] 08/30/2015  . 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 Thrombocytopenia ([place]) [D69.6] 08/27/2015  . Tobacco use disorder [F17.200] 08/26/2015  . Hyperlipidemia [E78.5] 04/28/2015  . Schizoaffective disorder, depressive type ([place]) [F25.1]   . Gout [M10.9] 08/09/2013  . Diabetes mellitus ([place]) [E11.9] 07/09/2012  . Polysubstance abuse [facility]) [F19.10] 07/09/2012   Subjective Data:  57 y.o AAM divorced, currently homeless. Background history of Schizoaffective disorder and SUD. Presented to the ER via emergency services. Patient has been off his medications for over six months. He has been abusing cocaine. Spent $800 on  a binge recently. He has been hearing voices. Feels there are people out to get him. He has threatened to set himself on fire. UDS is positive for cocaine.  BAL 190 mg/dl. Patient says he just got out of rehab. He is still hallucinating. Voices are still telling him to end his life. He has acted on them in the past. No cognitive impairment.  I discussed targeting hallucinations with Risperidone. He consented to treatment after we reviewed the risks and benefits.  No access to weapons.  He is cooperative with care.  He has agreed to treatment recommendations. He has agreed to communicate suicidal thoughts of with staff if the thoughts becomes overwhelming.     Continued Clinical Symptoms:  Alcohol Use Disorder Identification Test Final Score (AUDIT): 23 The "Alcohol Use Disorders Identification Test", Guidelines for Use in Primary Care, Second Edition.  World Pharmacologist [facility]). Score between 0-7:  no or low risk or alcohol related problems. Score between 8-15:  moderate risk of alcohol related problems. Score between 16-19:  high risk of alcohol related problems. Score 20 or above:  warrants further diagnostic evaluation for alcohol dependence and treatment.   CLINICAL FACTORS:   Alcohol/Substance Abuse/Dependencies   Musculoskeletal: Strength & Muscle Tone: within normal limits Gait & Station: normal Patient leans: N/A  Psychiatric Specialty Exam: Physical Exam  Constitutional: He appears well-developed and well-nourished.  HENT:  Head: Normocephalic and atraumatic.  Respiratory: Effort normal.  Neurological: He is alert.  Psychiatric:  As above    ROS  Blood pressure (!) 163/109, pulse 81, temperature 98.4 F (36.9 C), temperature source Oral, resp. rate 18, height 5\' 6"  (1.676 m), weight 86 kg (189 lb 9.5 oz), SpO2 99 %.Body mass index is 30.6 kg/m.  General Appearance: Poor personal hygiene. Distressed by internal stimulation.   Eye  Contact:  Fair  Speech:  Not  pressured  Volume:  Normal  Mood:  Overwhelmed by his subjective experience  Affect:  Blunted and mood congruent  Thought Process:  Linear  Orientation:  Full (Time, Place, and Person)  Thought Content:  Auditory hallucinations. Thoughts of violence.   Suicidal Thoughts:  Present but he is cooperative with care  Homicidal Thoughts:  No  Memory:  Immediate;   Fair Recent;   Fair Remote;   Fair  Judgement:  Fair  Insight:  Shallow  Psychomotor Activity:  Decreased  Concentration:  Concentration: Fair and Attention Span: Fair  Recall:  [employer] of Knowledge:  Fair  Language:  Good  Akathisia:  Negative  Handed:    AIMS (if indicated):     Assets:  Desire for Improvement Resilience  ADL's:  Impaired  Cognition:  Impaired,  Mild  Sleep:  Number of Hours: 5.75      COGNITIVE FEATURES THAT CONTRIBUTE TO RISK:  Loss of executive function    SUICIDE RISK:   Moderate:  Frequent suicidal ideation with limited intensity, and duration, some specificity in terms of plans, no associated intent, good self-control, limited dysphoria/symptomatology, some risk factors present, and identifiable protective factors, including available and accessible social support.  PLAN OF CARE:  1. Alcohol withdrawal protocol 2. Q 15 minute check for suicide. 3. Monitor mood, behavior and interaction with peers 4. SW would gather collateral from his family 5. SW would facilitate inpatient rehab placement 6. Risperidone 0.5 mg BID 7. Continue home medical medications at home dose   I certify that inpatient services furnished can reasonably be expected to improve the patient's condition.   [place], MD 11/23/2017, 4:49 PM

## 2017-11-23 NOTE — Progress Notes (Signed)
Bp now 145/85. Pt resting in bed in no acute distress. Will continue to monitor.

## 2017-11-23 NOTE — Progress Notes (Signed)
D: Patient observed resting in bed. Patient states he is not feeling well today. Speech difficult to understand as patient speaks lowly, mumbles. Patient's affect flat, mood depressed. Per self inventory and discussions with writer, rates depression at a 8/10, hopelessness at a 8/10 and anxiety at a 9.5/10. Rates sleep as good, appetite as fair, energy as low and concentration as fair.  States goal for today is to "stay alive."  BP this AM quite elevated as it was in ED prior to transfer. BP slightly improved at lunch time, CIWA is a "4" as he reports sweats, shakiness, anxiety and mild headache rated at a 4/10. Patient does not desire a prn for H/A.    A: Medicated per orders, no prns requested or required. NP made aware of patient's potassium level as well as elevated BP. Level III obs in place for safety. Emotional support offered and self inventory reviewed. Encouraged completion of Suicide Safety Plan and programming participation. Discussed POC with MD, SW.  Fall prevention plan in place and reviewed with patient as pt is a high fall risk due to unsteady gait, "bad knees" per patient. Order for wheelchair obtained and provided to patient and encouraged.  R: Patient verbalizes understanding of POC, falls prevention education. Will continue to monitor VS, CIWAs. Patient endorsing command AH to harm self and others. Also endorses VH. Denies plan, intent and verbal contract in place for safety. Patient remains safe on level III obs. Will continue to monitor closely and make verbal contact frequently.

## 2017-11-24 DIAGNOSIS — F39 Unspecified mood [affective] disorder: Secondary | ICD-10-CM

## 2017-11-24 DIAGNOSIS — E119 Type 2 diabetes mellitus without complications: Secondary | ICD-10-CM

## 2017-11-24 DIAGNOSIS — F10239 Alcohol dependence with withdrawal, unspecified: Secondary | ICD-10-CM

## 2017-11-24 DIAGNOSIS — I10 Essential (primary) hypertension: Secondary | ICD-10-CM

## 2017-11-24 LAB — GLUCOSE, CAPILLARY
GLUCOSE-CAPILLARY: 214 mg/dL — AB (ref 65–99)
GLUCOSE-CAPILLARY: 159 mg/dL — AB (ref 65–99)
Glucose-Capillary: 275 mg/dL — ABNORMAL HIGH (ref 65–99)
Glucose-Capillary: 239 mg/dL — ABNORMAL HIGH (ref 65–99)
Glucose-Capillary: 265 mg/dL — ABNORMAL HIGH (ref 65–99)

## 2017-11-24 LAB — LIPID PANEL
Cholesterol: 164 mg/dL (ref 0–200)
HDL: 52 mg/dL (ref >40)
LDL Cholesterol: 81 mg/dL (ref 0–99)
Total CHOL/HDL Ratio: 3.2 RATIO
Triglycerides: 156 mg/dL — ABNORMAL HIGH (ref <150)
VLDL: 31 mg/dL (ref 0–40)

## 2017-11-24 LAB — TSH: TSH: 1.375 u[IU]/mL (ref 0.350–4.500)

## 2017-11-24 LAB — HEMOGLOBIN A1C
Hgb A1c MFr Bld: 8.6 % — ABNORMAL HIGH (ref 4.8–5.6)
MEAN PLASMA GLUCOSE: 200.12 mg/dL

## 2017-11-24 MED ORDER — RISPERIDONE 1 MG PO TABS
1.0000 mg | ORAL_TABLET | Freq: Two times a day (BID) | ORAL | Status: DC
  Administered 2017-11-24 – 2017-11-25 (×3): 1 mg via ORAL
  Filled 2017-11-24 (×6): qty 1

## 2017-11-24 MED ORDER — INSULIN GLARGINE 100 UNIT/ML ~~LOC~~ SOLN
10.0000 [IU] | Freq: Every day | SUBCUTANEOUS | Status: DC
  Administered 2017-11-24 – 2017-11-26 (×3): 10 [IU] via SUBCUTANEOUS
  Filled 2017-11-24: qty 0.1

## 2017-11-24 MED ORDER — CLONIDINE HCL 0.1 MG PO TABS
0.1000 mg | ORAL_TABLET | Freq: Once | ORAL | Status: AC
  Administered 2017-11-24: 0.1 mg via ORAL
  Filled 2017-11-24 (×2): qty 1

## 2017-11-24 MED ORDER — TRAZODONE HCL 50 MG PO TABS
50.0000 mg | ORAL_TABLET | Freq: Every evening | ORAL | Status: DC | PRN
  Administered 2017-11-24 – 2017-11-25 (×2): 50 mg via ORAL
  Filled 2017-11-24 (×7): qty 1

## 2017-11-24 NOTE — Progress Notes (Signed)
Patient ID: [name], male   DOB: 08/07/1961, 57 y.o.   MRN: [id number]  Writer spoke with NP Bobby Rumpf this evening about patient's BP. Patient received a one time Clonidine 0.1 mg dose approximately an hour ago, see MAR. Patient denies symptoms such as double vision, blurred vision, headache, or lightheadedness. He reports he is feeling weak but otherwise no symptoms. NP Tanika notified of patient's BP and current presentation. BP has improved since Clonidine was administered.

## 2017-11-24 NOTE — Progress Notes (Signed)
[facility] MD Progress Note  11/24/2017 4:24 PM [name]  MRN:  [id number]   Subjective:  " I am alright."  Objective: Aqil is  patient is alert and oriented. Seen resting in wheelchair. Patient was evaluated by MD and NP. Patient has a flat and guarded affect. He denies any withdrawal symptoms at this time. Denies suicidal or homicidal ideations during this assessment. and does not appear to be internally preoccupied. He continues to present with a depressed mood and his affect is congruent with mood and restricted. Ayodele was non complaint with his medications prior to admission however thus far, he has been complaint with medications a noted below. He denies any side effects to medications.  Diabetic consult reviewed and medications for diabetes management noted below. He endorses no concerns with sleeping pattern however, endorses decreased appetite. At this time, he is able to contract for safety on the unit. Support, encouragement  and reassurances was provided.  Principal Problem: Schizoaffective disorder, bipolar type ([place]) Diagnosis:   Patient Active Problem List   Diagnosis Date Noted  . Schizoaffective disorder, bipolar type ([place]) [F25.0] 11/22/2017  . Trauma [T14.90XA]   . Tachycardia [R00.0] 03/03/2017  . Alcohol abuse [F10.10]   . Cocaine abuse ([place]) [F14.10]   . Hypertensive urgency [I16.0]   . Tachycardia [R00.0]   . Cocaine abuse with cocaine-induced mood disorder ([place]) [F14.14] 12/05/2016  . Cerebrovascular disease [I67.9] 09/02/2015  . 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 Malignant hypertension [I10] 08/30/2015  . Alcohol use disorder, severe, dependence ([place]) [F10.20] 08/30/2015  . Alcohol withdrawal ([place]) [F10.239] 08/30/2015  . 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 Thrombocytopenia ([place]) [D69.6]  08/27/2015  . Tobacco use disorder [F17.200] 08/26/2015  . Hyperlipidemia [E78.5] 04/28/2015  . Schizoaffective disorder, depressive type ([place]) [F25.1]   . Gout [M10.9] 08/09/2013  . Diabetes mellitus ([place]) [E11.9] 07/09/2012  . Polysubstance abuse ([place]) [F19.10] 07/09/2012   Total Time spent with patient: 30 minutes  Past Psychiatric History: Schizophrenia, Bipolar disorder.    Past Medical History:  Past Medical History:  Diagnosis Date  . Alcohol abuse   . Arthritis   . Crack cocaine use   . Depression   . Diabetes mellitus   . Drug abuse ([place])   . DTs (delirium tremens) ([place])    history of   . Gout   . Noncompliance with medication regimen   . Schizophrenia ([place])   . Stroke ([place])   . Uncontrolled hypertension     Past Surgical History:  Procedure Laterality Date  . MANDIBLE RECONSTRUCTION    . TONSILLECTOMY     Family History:  Family History  Problem Relation Age of Onset  . Hypertension Unknown    Family Psychiatric  History: Denies any known familial hx of mental illness.   Social History:  Social History   Substance and Sexual Activity  Alcohol Use Yes  . Alcohol/week: 0.6 - 1.2 oz  . Types: 1 - 2 Cans of beer per week   Comment: [facility]     Social History   Substance and Sexual Activity  Drug Use Yes  . Types: "Crack" cocaine, Cocaine    Social History   Socioeconomic History  . Marital status: Legally Separated    Spouse name: None  . Number of children: None  . Years of education: None  . Highest education level: None  Social Needs  . Financial resource strain:  None  . Food insecurity - worry: None  . Food insecurity - inability: None  . Transportation needs - medical: None  . Transportation needs - non-medical: None  Occupational History  . None  Tobacco Use  . Smoking status: Current Every Day Smoker    Packs/day: 0.25    Types: Cigarettes  . Smokeless tobacco: Never Used  Substance and Sexual Activity  . Alcohol use:  Yes    Alcohol/week: 0.6 - 1.2 oz    Types: 1 - 2 Cans of beer per week    Comment: [facility]  . Drug use: Yes    Types: "Crack" cocaine, Cocaine  . Sexual activity: None  Other Topics Concern  . None  Social History Narrative  . None   Additional Social History:                         Sleep: Fair  Appetite:  decreased  Current Medications: Current Facility-Administered Medications  Medication Dose Route Frequency Provider Last Rate Last Dose  . acetaminophen (TYLENOL) tablet 650 mg  650 mg Oral Q6H PRN [name], FNP   650 mg at 11/23/17 2026  . alum & mag hydroxide-simeth (MAALOX/MYLANTA) 200-200-20 MG/5ML suspension 30 mL  30 mL Oral Q4H PRN Withrow, John C, FNP      . amLODipine (NORVASC) tablet 10 mg  10 mg Oral Daily [name], FNP   10 mg at 11/24/17 0809  . cloNIDine (CATAPRES) tablet 0.1 mg  0.1 mg Oral Once Maris Berger T, MD      . gabapentin (NEURONTIN) capsule 200 mg  200 mg Oral TID PC & HS Nwoko, Agnes I, NP   200 mg at 11/24/17 1212  . hydrochlorothiazide (HYDRODIURIL) tablet 25 mg  25 mg Oral Daily [name], FNP   25 mg at 11/24/17 0809  . hydrOXYzine (ATARAX/VISTARIL) tablet 25 mg  25 mg Oral Q6H PRN Lindon Romp A, NP      . insulin aspart (novoLOG) injection 0-15 Units  0-15 Units Subcutaneous TID WC [name], FNP   5 Units at 11/24/17 1210  . loperamide (IMODIUM) capsule 2-4 mg  2-4 mg Oral PRN Lindon Romp A, NP      . LORazepam (ATIVAN) tablet 1 mg  1 mg Oral Q6H PRN Lindon Romp A, NP      . LORazepam (ATIVAN) tablet 1 mg  1 mg Oral TID Lindon Romp A, NP   1 mg at 11/24/17 1254   Followed by  . [START ON 11/25/2017] LORazepam (ATIVAN) tablet 1 mg  1 mg Oral BID Rozetta Nunnery, NP       Followed by  . [START ON 11/27/2017] LORazepam (ATIVAN) tablet 1 mg  1 mg Oral Daily Lindon Romp A, NP      . magnesium hydroxide (MILK OF MAGNESIA) suspension 30 mL  30 mL Oral Daily PRN Withrow, John C, FNP      .  metFORMIN (GLUCOPHAGE) tablet 500 mg  500 mg Oral BID WC Withrow, John C, FNP   500 mg at 11/24/17 0809  . nicotine polacrilex (NICORETTE) gum 2 mg  2 mg Oral PRN Pennelope Bracken, MD      . ondansetron (ZOFRAN-ODT) disintegrating tablet 4 mg  4 mg Oral Q6H PRN Lindon Romp A, NP      . potassium chloride SA (K-DUR,KLOR-CON) CR tablet 20 mEq  20 mEq Oral Daily Encarnacion Slates, NP  20 mEq at 11/24/17 0810  . risperiDONE (RISPERDAL) tablet 1 mg  1 mg Oral BID Pennelope Bracken, MD      . thiamine (VITAMIN B-1) tablet 100 mg  100 mg Oral Daily Withrow, Elyse Jarvis, FNP   100 mg at 11/24/17 1025   Or  . thiamine (B-1) injection 100 mg  100 mg Intravenous Daily Withrow, Elyse Jarvis, FNP        Lab Results:  Results for orders placed or performed during the hospital encounter of 11/22/17 (from the past 48 hour(s))  Glucose, capillary     Status: Abnormal   Collection Time: 11/22/17 10:34 PM  Result Value Ref Range   Glucose-Capillary 110 (H) 65 - 99 mg/dL  Glucose, capillary     Status: Abnormal   Collection Time: 11/23/17  6:21 AM  Result Value Ref Range   Glucose-Capillary 225 (H) 65 - 99 mg/dL  Glucose, capillary     Status: Abnormal   Collection Time: 11/23/17 11:47 AM  Result Value Ref Range   Glucose-Capillary 272 (H) 65 - 99 mg/dL  Glucose, capillary     Status: Abnormal   Collection Time: 11/23/17  4:50 PM  Result Value Ref Range   Glucose-Capillary 250 (H) 65 - 99 mg/dL  Glucose, capillary     Status: Abnormal   Collection Time: 11/24/17  6:02 AM  Result Value Ref Range   Glucose-Capillary 214 (H) 65 - 99 mg/dL   Comment 1 Notify RN   Lipid panel     Status: Abnormal   Collection Time: 11/24/17  6:45 AM  Result Value Ref Range   Cholesterol 164 0 - 200 mg/dL   Triglycerides 156 (H) <150 mg/dL   HDL 52 >40 mg/dL   Total CHOL/HDL Ratio 3.2 RATIO   VLDL 31 0 - 40 mg/dL   LDL Cholesterol 81 0 - 99 mg/dL    Comment:        Total Cholesterol/HDL:CHD Risk Coronary Heart  Disease Risk Table                     Men   Women  1/2 Average Risk   3.4   3.3  Average Risk       5.0   4.4  2 X Average Risk   9.6   7.1  3 X Average Risk  23.4   11.0        Use the calculated Patient Ratio above and the CHD Risk Table to determine the patient's CHD Risk.        ATP III CLASSIFICATION (LDL):  <100     mg/dL   Optimal  100-129  mg/dL   Near or Above                    Optimal  130-159  mg/dL   Borderline  160-189  mg/dL   High  >190     mg/dL   Very High Performed at [facility] [address] [place], [place] [address]   Hemoglobin A1c     Status: Abnormal   Collection Time: 11/24/17  6:45 AM  Result Value Ref Range   Hgb A1c MFr Bld 8.6 (H) 4.8 - 5.6 %    Comment: (NOTE) Pre diabetes:          5.7%-6.4% Diabetes:              >6.4% Glycemic control for   <7.0% adults with diabetes  Mean Plasma Glucose 200.12 mg/dL    Comment: Performed at [facility] [address], [place], [place] [address]  TSH     Status: None   Collection Time: 11/24/17  6:45 AM  Result Value Ref Range   TSH 1.375 0.350 - 4.500 uIU/mL    Comment: Performed by a 3rd Generation assay with a functional sensitivity of <=0.01 uIU/mL. Performed at [facility], [place] [address] [place], [place] [address]   Glucose, capillary     Status: Abnormal   Collection Time: 11/24/17 10:13 AM  Result Value Ref Range   Glucose-Capillary 275 (H) 65 - 99 mg/dL  Glucose, capillary     Status: Abnormal   Collection Time: 11/24/17 11:54 AM  Result Value Ref Range   Glucose-Capillary 239 (H) 65 - 99 mg/dL    Blood Alcohol level:  Lab Results  Component Value Date   ETH 190 (H) 11/22/2017   ETH <5 55/97/4163    Metabolic Disorder Labs: Lab Results  Component Value Date   HGBA1C 8.6 (H) 11/24/2017   MPG 200.12 11/24/2017   MPG 283 03/03/2017   Lab Results  Component Value Date   PROLACTIN 47.8 (H) 04/28/2015   Lab Results   Component Value Date   CHOL 164 11/24/2017   TRIG 156 (H) 11/24/2017   HDL 52 11/24/2017   CHOLHDL 3.2 11/24/2017   VLDL 31 11/24/2017   LDLCALC 81 11/24/2017   LDLCALC 15 08/29/2015    Physical Findings: AIMS: Facial and Oral Movements Muscles of Facial Expression: None, normal Lips and Perioral Area: None, normal Jaw: None, normal Tongue: None, normal,Extremity Movements Upper (arms, wrists, hands, fingers): None, normal Lower (legs, knees, ankles, toes): None, normal, Trunk Movements Neck, shoulders, hips: None, normal, Overall Severity Severity of abnormal movements (highest score from questions above): None, normal Incapacitation due to abnormal movements: None, normal Patient's awareness of abnormal movements (rate only patient's report): No Awareness, Dental Status Current problems with teeth and/or dentures?: No Does patient usually wear dentures?: No  CIWA:  CIWA-Ar Total: 0 COWS:     Musculoskeletal: Strength & Muscle Tone: UTA seen resting in wheelchair Gait & Station: UTA Patient leans: UTA  Psychiatric Specialty Exam: Physical Exam  Nursing note and vitals reviewed. Constitutional: He is oriented to person, place, and time. He appears well-developed.  Neurological: He is alert and oriented to person, place, and time.  Psychiatric: He has a normal mood and affect. His behavior is normal.    Review of Systems  Psychiatric/Behavioral: Positive for depression and substance abuse. Negative for hallucinations, memory loss and suicidal ideas. The patient is not nervous/anxious and does not have insomnia.   All other systems reviewed and are negative.   Blood pressure (!) 156/108, pulse 92, temperature 98 F (36.7 C), temperature source Oral, resp. rate 16, height 5\' 6"  (1.676 m), weight 189 lb 9.5 oz (86 kg), SpO2 99 %.Body mass index is 30.6 kg/m.  General Appearance: Disheveled  Eye Contact:  Fair  Speech:  Clear and Coherent and Normal Rate  Volume:   Decreased  Mood:  Anxious, Depressed and Hopeless  Affect:  Depressed and Flat  Thought Process:  Coherent, Goal Directed, Linear and Descriptions of Associations: Tangential  Orientation:  Full (Time, Place, and Person)  Thought Content:  Logical denies auditory or visual hallucination.  Does not appear to be internally preoccupied.   Suicidal Thoughts:  No  Homicidal Thoughts:  No  Memory:  Immediate;   Fair Recent;  Fair  Judgement:  Impaired  Insight:  Shallow  Psychomotor Activity:  Normal  Concentration:  Concentration: Fair and Attention Span: Fair  Recall:  [employer] of Knowledge:  Fair  Language:  Good  Akathisia:  Negative  Handed:  Right  AIMS (if indicated):     Assets:  Communication Skills Desire for Improvement Housing Physical Health Resilience  ADL's:  Intact  Cognition:  WNL  Sleep:  Number of Hours: 6.75     Treatment Plan Summary: Daily contact with patient to assess and evaluate symptoms and progress in treatment and Medication management;   Continue with current treatment plan listed below on 11/24/2017 except where noted  Mood stabilization:  -Continue with Risperidone 1 mg PO BID  -Continue with  Neurontin 200 mg  TID 200 mg  PO    Insomnia               -Continue with Trazodone 50 mg for insomnia  Alcohol  Detox          - Continue  on CWIA/ Ativan Protocol  HTN:       -Continue Norvasc 10 mg po daily and HCTZ 25 mg po daily for hypertension  DM:   Metformin 500 mg po bid for DM. Will continue glucose checks  Start Lantus 10 units  (see DM consult)   Will continue to monitor vitals ,medication compliance and treatment side effects while patient is here.  Reviewed labs; A1C 8.6  Elevated, triglyceride 156 ,BAL - 190, UDS - positive for cocaine CSW will start working on disposition.  Patient to participate in therapeutic milieu   Derrill [place], NP 11/24/2017, 4:24 PM

## 2017-11-24 NOTE — Progress Notes (Signed)
Dar Note: Patient presents with flat affect and depressed mood.  Denies suicidal thoughts and auditory hallucination.  Medications given as prescribed.  Patient remained withdrawn and isolates to his room.  Elevated blood pressure result of 164/104 and 164/108 reported to MD.  Patient encouraged to get out of bed and to be visible in milieu.  Diabetic consult recommendations reported to MD.  Denies any withdrawal symptoms.   Did not attend group after several encouragement.  Patient with minimal interaction with staff.  Routine safety checks maintained.  Offered support and encouragement as needed.  Patient is safe on the unit.

## 2017-11-24 NOTE — BHH Group Notes (Signed)
LCSW Group Therapy Note  11/24/2017 1:15pm  Type of Therapy/Topic:  Group Therapy:  Balance in Life  Participation Level:  Did Not Attend  Description of Group:    This group will address the concept of balance and how it feels and looks when one is unbalanced. Patients will be encouraged to process areas in their lives that are out of balance and identify reasons for remaining unbalanced. Facilitators will guide patients in utilizing problem-solving interventions to address and correct the stressor making their life unbalanced. Understanding and applying boundaries will be explored and addressed for obtaining and maintaining a balanced life. Patients will be encouraged to explore ways to assertively make their unbalanced needs known to significant others in their lives, using other group members and facilitator for support and feedback.  Therapeutic Goals: 1. Patient will identify two or more emotions or situations they have that consume much of in their lives. 2. Patient will identify signs/triggers that life has become out of balance:  3. Patient will identify two ways to set boundaries in order to achieve balance in their lives:  4. Patient will demonstrate ability to communicate their needs through discussion and/or role plays  Summary of Patient Progress:      Therapeutic Modalities:   Cognitive Behavioral Therapy Solution-Focused Therapy Assertiveness Training  [name], [place] 11/24/2017 3:37 PM

## 2017-11-24 NOTE — BHH Suicide Risk Assessment (Signed)
[facility] INPATIENT:  Family/Significant Other Suicide Prevention Education  Suicide Prevention Education:  Patient Refusal for Family/Significant Other Suicide Prevention Education: The patient [name] has refused to provide written consent for family/significant other to be provided Family/Significant Other Suicide Prevention Education during admission and/or prior to discharge.  Physician notified.  [name] 11/24/2017, 3:33 PM
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
## 2017-11-24 NOTE — Progress Notes (Signed)
Recreation Therapy Notes  Date: 11/24/17 Time: 1000 Location: 500 Hall Dayroom  Group Topic: Communication  Goal Area(s) Addresses:  Patient will effectively communicate with peers in group.  Patient will verbalize benefit of healthy communication. Patient will verbalize positive effect of healthy communication on post d/c goals.  Patient will identify communication techniques that made activity effective for group.   Intervention: Geometrical pictures, pencils, blank paper  Activity: Back to Back Drawings.  Each patient was given an opportunity to describe a picture to their peers.  The peers listening were to draw the picture as it was being described by the person talking.  Patients were unable to ask any clarifying questions of the person speaking.  Education: Communication, Discharge Planning  Education Outcome: Acknowledges understanding/In group clarification offered/Needs additional education.   Clinical Observations/Feedback: Pt did not attend group.    Victorino Sparrow, LRT/CTRS         Victorino Sparrow A 11/24/2017 12:29 PM

## 2017-11-24 NOTE — Progress Notes (Signed)
Inpatient Diabetes Program Recommendations  AACE/ADA: New Consensus Statement on Inpatient Glycemic Control (2015)  Target Ranges:  Prepandial:   less than 140 mg/dL      Peak postprandial:   less than 180 mg/dL (1-2 hours)      Critically ill patients:  140 - 180 mg/dL   Lab Results  Component Value Date   GLUCAP 275 (H) 11/24/2017   HGBA1C 11.5 (H) 03/03/2017    Review of Glycemic Control  Diabetes history: DM2 Outpatient Diabetes medications: metformin 500 mg bid (has not taken in 5 months) Current orders for Inpatient glycemic control: Novolog 0-15 units tidwc  Inpatient Diabetes Program Recommendations:    Need updated HgbA1C to assess glycemic control prior to admission. Lantus 10 units QHS Needs case manager consult for PCP to f/u for management of diabetes.  Will call and speak with RN.  Thank you. Lorenda Peck, RD, LDN, CDE Inpatient Diabetes Coordinator [phone]

## 2017-11-24 NOTE — Tx Team (Signed)
Interdisciplinary Treatment and Diagnostic Plan Update  11/24/2017 Time of Session: 8:36 AM  [name] MRN: [id number]  Principal Diagnosis: Schizoaffective disorder, bipolar type ([place])  Secondary Diagnoses: Principal Problem:   Schizoaffective disorder, bipolar type ([place]) Active Problems:   Alcohol use disorder, severe, dependence ([place])   Cocaine abuse with cocaine-induced mood disorder ([place])   Current Medications:  Current Facility-Administered Medications  Medication Dose Route Frequency Provider Last Rate Last Dose  . acetaminophen (TYLENOL) tablet 650 mg  650 mg Oral Q6H PRN [name], FNP   650 mg at 11/23/17 2026  . alum & mag hydroxide-simeth (MAALOX/MYLANTA) 200-200-20 MG/5ML suspension 30 mL  30 mL Oral Q4H PRN Withrow, John C, FNP      . amLODipine (NORVASC) tablet 10 mg  10 mg Oral Daily [name], FNP   10 mg at 11/24/17 0809  . gabapentin (NEURONTIN) capsule 200 mg  200 mg Oral TID PC & HS Nwoko, Agnes I, NP   200 mg at 11/24/17 0809  . hydrochlorothiazide (HYDRODIURIL) tablet 25 mg  25 mg Oral Daily [name], FNP   25 mg at 11/24/17 0809  . hydrOXYzine (ATARAX/VISTARIL) tablet 25 mg  25 mg Oral Q6H PRN Lindon Romp A, NP      . insulin aspart (novoLOG) injection 0-15 Units  0-15 Units Subcutaneous TID WC [name], FNP   5 Units at 11/24/17 [phone]  . loperamide (IMODIUM) capsule 2-4 mg  2-4 mg Oral PRN Lindon Romp A, NP      . LORazepam (ATIVAN) tablet 1 mg  1 mg Oral Q6H PRN Lindon Romp A, NP      . LORazepam (ATIVAN) tablet 1 mg  1 mg Oral TID Rozetta Nunnery, NP       Followed by  . [START ON 11/25/2017] LORazepam (ATIVAN) tablet 1 mg  1 mg Oral BID Rozetta Nunnery, NP       Followed by  . [START ON 11/27/2017] LORazepam (ATIVAN) tablet 1 mg  1 mg Oral Daily Lindon Romp A, NP      . magnesium hydroxide (MILK OF MAGNESIA) suspension 30 mL  30 mL Oral Daily PRN Withrow, John C, FNP      . metFORMIN (GLUCOPHAGE) tablet 500 mg  500 mg Oral  BID WC Withrow, John C, FNP   500 mg at 11/24/17 0809  . nicotine polacrilex (NICORETTE) gum 2 mg  2 mg Oral PRN Pennelope Bracken, MD      . ondansetron (ZOFRAN-ODT) disintegrating tablet 4 mg  4 mg Oral Q6H PRN Lindon Romp A, NP      . potassium chloride SA (K-DUR,KLOR-CON) CR tablet 20 mEq  20 mEq Oral Daily Lindell Spar I, NP   20 mEq at 11/24/17 0810  . risperiDONE (RISPERDAL) tablet 0.5 mg  0.5 mg Oral BID Izediuno, Laruth Bouchard, MD   0.5 mg at 11/24/17 0809  . thiamine (VITAMIN B-1) tablet 100 mg  100 mg Oral Daily Withrow, Elyse Jarvis, FNP   100 mg at 11/24/17 8099   Or  . thiamine (B-1) injection 100 mg  100 mg Intravenous Daily Withrow, John C, FNP        PTA Medications: Medications Prior to Admission  Medication Sig Dispense Refill Last Dose  . amLODipine (NORVASC) 10 MG tablet Take 1 tablet (10 mg total) by mouth daily. (Patient not taking: Reported on 11/22/2017) 30 tablet 0 Not Taking at Unknown time  . cloNIDine (CATAPRES - DOSED IN MG/24  HR) 0.3 mg/24hr patch Place 1 patch (0.3 mg total) onto the skin once a week. (Patient not taking: Reported on 11/22/2017) 4 patch 0 Not Taking at Unknown time  . DULoxetine (CYMBALTA) 30 MG capsule Take 1 capsule (30 mg total) by mouth 2 (two) times daily. (Patient not taking: Reported on 11/22/2017) 60 capsule 0 Not Taking at Unknown time  . hydrochlorothiazide (HYDRODIURIL) 25 MG tablet Take 1 tablet (25 mg total) by mouth daily. (Patient not taking: Reported on 11/22/2017) 30 tablet 0 Not Taking at Unknown time  . hydrOXYzine (ATARAX/VISTARIL) 25 MG tablet Take 1 tablet (25 mg total) by mouth 3 (three) times daily as needed for anxiety. (Patient not taking: Reported on 11/22/2017) 30 tablet 0 Not Taking at Unknown time  . meloxicam (MOBIC) 7.5 MG tablet Take 7.5 mg by mouth 2 (two) times daily as needed for pain.   Not Taking at Unknown time  . metFORMIN (GLUCOPHAGE) 500 MG tablet Take 1 tablet (500 mg total) by mouth 2 (two) times daily with a meal.  (Patient not taking: Reported on 11/22/2017) 60 tablet 0 Not Taking at Unknown time  . paliperidone (INVEGA SUSTENNA) 156 MG/ML SUSP injection Inject 156 mg into the muscle every 30 (thirty) days.   Not Taking at Unknown time  . tiZANidine (ZANAFLEX) 2 MG tablet Take 2-4 mg by mouth every 8 (eight) hours as needed for muscle spasms.   Not Taking at Unknown time    Patient Stressors: Financial difficulties Health problems Medication change or noncompliance Substance abuse  Patient Strengths: Network engineer for treatment/growth  Treatment Modalities: Medication Management, Group therapy, Case management,  1 to 1 session with clinician, Psychoeducation, Recreational therapy.   Physician Treatment Plan for Primary Diagnosis: Schizoaffective disorder, bipolar type ([place]) Long Term Goal(s): Improvement in symptoms so as ready for discharge  Short Term Goals: Ability to identify changes in lifestyle to reduce recurrence of condition will improve Ability to verbalize feelings will improve Ability to demonstrate self-control will improve Ability to identify and develop effective coping behaviors will improve Compliance with prescribed medications will improve Ability to identify triggers associated with substance abuse/mental health issues will improve  Medication Management: Evaluate patient's response, side effects, and tolerance of medication regimen.  Therapeutic Interventions: 1 to 1 sessions, Unit Group sessions and Medication administration.  Evaluation of Outcomes: Progressing  Physician Treatment Plan for Secondary Diagnosis: Principal Problem:   Schizoaffective disorder, bipolar type ([place]) Active Problems:   Alcohol use disorder, severe, dependence ([place])   Cocaine abuse with cocaine-induced mood disorder ([place])   Long Term Goal(s): Improvement in symptoms so as ready for discharge  Short Term Goals: Ability to identify changes in  lifestyle to reduce recurrence of condition will improve Ability to verbalize feelings will improve Ability to demonstrate self-control will improve Ability to identify and develop effective coping behaviors will improve Compliance with prescribed medications will improve Ability to identify triggers associated with substance abuse/mental health issues will improve  Medication Management: Evaluate patient's response, side effects, and tolerance of medication regimen.  Therapeutic Interventions: 1 to 1 sessions, Unit Group sessions and Medication administration.  Evaluation of Outcomes: Progressing   RN Treatment Plan for Primary Diagnosis: Schizoaffective disorder, bipolar type ([place]) Long Term Goal(s): Knowledge of disease and therapeutic regimen to maintain health will improve  Short Term Goals: Ability to identify and develop effective coping behaviors will improve and Compliance with prescribed medications will improve  Medication Management: RN will administer medications as ordered  by provider, will assess and evaluate patient's response and provide education to patient for prescribed medication. RN will report any adverse and/or side effects to prescribing provider.  Therapeutic Interventions: 1 on 1 counseling sessions, Psychoeducation, Medication administration, Evaluate responses to treatment, Monitor vital signs and CBGs as ordered, Perform/monitor CIWA, COWS, AIMS and Fall Risk screenings as ordered, Perform wound care treatments as ordered.  Evaluation of Outcomes: Progressing   LCSW Treatment Plan for Primary Diagnosis: Schizoaffective disorder, bipolar type ([place]) Long Term Goal(s): Safe transition to appropriate next level of care at discharge, Engage patient in therapeutic group addressing interpersonal concerns.  Short Term Goals: Engage patient in aftercare planning with referrals and resources  Therapeutic Interventions: Assess for all discharge needs, 1 to 1 time  with Social worker, Explore available resources and support systems, Assess for adequacy in community support network, Educate family and significant other(s) on suicide prevention, Complete Psychosocial Assessment, Interpersonal group therapy.  Evaluation of Outcomes: Progressing  Hopes to get into [facility] rehab from here, as well as reuniting with his ACT team.   Progress in Treatment: Attending groups: No Participating in groups: Yes Taking medication as prescribed: No Toleration medication: Yes, no side effects reported at this time Family/Significant other contact made: No Patient understands diagnosis: Yes AEB asking for help with detox, getting back on meds Discussing patient identified problems/goals with staff: Yes Medical problems stabilized or resolved: Yes Denies suicidal/homicidal ideation: Yes Issues/concerns per patient self-inventory: None Other: N/A  New problem(s) identified: None identified at this time.   New Short Term/Long Term Goal(s): "find somewhere to go"  Discharge Plan or Barriers:   Reason for Continuation of Hospitalization:  Depression Hallucinations  Medication stabilization   Estimated Length of Stay: 1/11  Attendees: Patient: [name] 11/24/2017  8:36 AM  Physician: Maris Berger, MD 11/24/2017  8:36 AM  Nursing: Sena Hitch, RN 11/24/2017  8:36 AM  RN Care Manager: Lars Pinks, RN 11/24/2017  8:36 AM  Social Worker: Ripley Fraise 11/24/2017  8:36 AM  Recreational Therapist: Winfield Cunas 11/24/2017  8:36 AM  Other: Norberto Sorenson 11/24/2017  8:36 AM  Other:  11/24/2017  8:36 AM    Scribe for Treatment Team:  Roque Lias LCSW 11/24/2017 8:36 AM

## 2017-11-25 DIAGNOSIS — F1020 Alcohol dependence, uncomplicated: Secondary | ICD-10-CM

## 2017-11-25 DIAGNOSIS — R531 Weakness: Secondary | ICD-10-CM

## 2017-11-25 DIAGNOSIS — F1414 Cocaine abuse with cocaine-induced mood disorder: Secondary | ICD-10-CM

## 2017-11-25 LAB — GLUCOSE, CAPILLARY
GLUCOSE-CAPILLARY: 223 mg/dL — AB (ref 65–99)
Glucose-Capillary: 216 mg/dL — ABNORMAL HIGH (ref 65–99)
Glucose-Capillary: 182 mg/dL — ABNORMAL HIGH (ref 65–99)
Glucose-Capillary: 247 mg/dL — ABNORMAL HIGH (ref 65–99)

## 2017-11-25 LAB — PROLACTIN: PROLACTIN: 17.6 ng/mL — AB (ref 4.0–15.2)

## 2017-11-25 MED ORDER — LORAZEPAM 1 MG PO TABS
1.0000 mg | ORAL_TABLET | Freq: Four times a day (QID) | ORAL | Status: DC
  Administered 2017-11-25 – 2017-11-26 (×4): 1 mg via ORAL
  Filled 2017-11-25 (×4): qty 1

## 2017-11-25 MED ORDER — HYDRALAZINE HCL 25 MG PO TABS
25.0000 mg | ORAL_TABLET | Freq: Three times a day (TID) | ORAL | Status: DC
Start: 2017-11-25 — End: 2017-11-27
  Administered 2017-11-25 – 2017-11-26 (×3): 25 mg via ORAL
  Filled 2017-11-25 (×13): qty 1
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
## 2017-11-25 NOTE — Progress Notes (Signed)
Patient ID: [name], male   DOB: 14-Jul-1961, 57 y.o.   MRN: [id number]  D: Patient in bed a lot after shift change. Reports still feels weak and has trouble walking at times. Has wheelchair at bedside. Encouraged to use call bell or ask person doing 15 minute checks if he needs anything or has any issues. Denies any SI and contracts for safety. A: Staff will monitor on q 15 minute checks, follow treatment plan, and give medications as ordered. R: Cooperative on the unit. Took medication without issue. BP staying about his baseline here 161/98 and blood sugar 159 tonight.

## 2017-11-25 NOTE — Progress Notes (Signed)
MHT came to RN expressing concern as patient continues to display unsteady gait, confusion. MHT observed patient spilling items in the dayroom and unable to clean up after self. Patient's roommate states patient has been urinating on the floor in the room. Observed sleeping on and off in wheelchair, confused at times as to which hall he is on (patient was moved from 500 yesterday.) Unable to transfer to toilet without assistance. Dr. Modesta Messing called and consulted. Order received to initiate 1:1 obs for safety, high fall risk. Current and night shift charge RN made aware along with [facility]. NS notified to update census. Central staffing notified that order entered into Epic. Patient also notified and verbalizes understanding. MHT sitting, conversing with patient at this time in the dayroom.

## 2017-11-25 NOTE — Progress Notes (Signed)
D: Pt will auditory hallucinations. Patient is passive SI but contracts for safety Patient is pleasant and cooperative. Pt with staff at bedside. Patient is a fall risk due to generalized weakness and unsteady on feet.  A: Pt was offered support and encouragement. Pt was given scheduled medications. Pt was encourage to attend groups. Q 15 minute checks were done for safety.  R:Pt did not attend groups and interacts well with  staff. Pt is taking medication. Pt has no complaints.Pt receptive to treatment and safety maintained on unit.

## 2017-11-25 NOTE — BHH Group Notes (Signed)
LCSW Group Therapy Note  11/25/2017 1:15pm  Type of Therapy/Topic:  Group Therapy:  Feelings about Diagnosis  Participation Level:  Minimal   Description of Group:   This group will allow patients to explore their thoughts and feelings about diagnoses they have received. Patients will be guided to explore their level of understanding and acceptance of these diagnoses. Facilitator will encourage patients to process their thoughts and feelings about the reactions of others to their diagnosis and will guide patients in identifying ways to discuss their diagnosis with significant others in their lives. This group will be process-oriented, with patients participating in exploration of their own experiences, giving and receiving support, and processing challenge from other group members.   Therapeutic Goals: 1. Patient will demonstrate understanding of diagnosis as evidenced by identifying two or more symptoms of the disorder 2. Patient will be able to express two feelings regarding the diagnosis 3. Patient will demonstrate their ability to communicate their needs through discussion and/or role play  Summary of Patient Progress:  Patient was present for majority of group but was disengaged and drowsy. Patient continues to demonstrate some confusion and poor insight. He was pleasant and cooperative when engaged with CSW directly.    Therapeutic Modalities:   Cognitive Behavioral Therapy Brief Therapy Feelings Identification    Kimber Relic Smart, LCSW 11/25/2017 3:07 PM

## 2017-11-25 NOTE — Progress Notes (Signed)
Pt did not attend NA group this evening.  

## 2017-11-25 NOTE — Plan of Care (Signed)
Patient verbalizes understanding of information, education provided. 

## 2017-11-25 NOTE — Progress Notes (Signed)
Started Hydralazine 24 mg PO TID to see if that will help with better blood pressure control. May increase the dose if indicated.   Faye Ramsay, MD  [facility] Pager [phone]  If 7PM-7AM, please contact night-coverage [email or web address] Password [email or web address]

## 2017-11-25 NOTE — Progress Notes (Signed)
Nursing 1:1 note D:Pt observed sleeping in bed with eyes closed. RR even and unlabored. No distress noted. Staff remains at bedside.  A: 1:1 observation continues for safety  R: pt remains safe on unit.

## 2017-11-25 NOTE — Significant Event (Cosign Needed)
S: Asked per NS to evaluate patient admitted with dual diagnosis of depression and chronic polysubstance abuse (alcohol and cocaine ) due to AMS changes. Patient was observed sitting in a wheel chair, comfortable and in no apperant distress, watching a basketball game and accompanied with a MHT, due to his current 1:1 observation status. [name] is slightly drowsy, but alert and orientated and responding to verbal questions and commands. He denies any HA, vertigo, CP or respiratory concerns.  O: V/S Sitting 161/103, P 109, standing 158/105 P 117 MMSE: score (29) Integument W/D with fair turgor HEENT: No nystagmus, PERRLA Pulm : CTA Cardio: Audible S1,S2 no M/G/R Neuro: CN - 2- 12 grossly intact, no L.T deficits of LE bilat  A/P: Clinical picture suggestive of mild DT's with possible hypovolemia vs alcoholic hallucinations. Doubt Wernicke's encephalopathy due to current MSE findings,and no nystagmus on exam * Continue 1;1 observation due to fall risk * Continue to monitor CIWA * Increase Ativan 1 - 2 mg QID * Check BMP, Mg, Serum Thiamine in am * Consider hospital evaluation with notable hypovolemia, ortho stasis, marked thiamine deficit requiring IV administration, and or mental status decline. * D/c trazodone * D/c Risperidone * D/c HCTZ * Case discussed with attending  [name] Carwin MD

## 2017-11-25 NOTE — Plan of Care (Signed)
Patient is resting in bed with eyes closed. Patient appears to be asleep. Patient remains safe on unit. Patient has been free of self harm behaviors this shift. Monitoring continues.

## 2017-11-25 NOTE — Progress Notes (Signed)
Recreation Therapy Notes  Animal-Assisted Activity (AAA) Program Checklist/Progress Notes Patient Eligibility Criteria Checklist & Daily Group note for Rec TxIntervention  Date: 01.18.2018 Time: 2:45pm Location: [address]   AAA/T Program Assumption of Risk Form signed by Patient/ or Parent Legal Guardian Yes  Patient is free of allergies or sever asthma  Yes  Patient reports no fear of animals Yes  Patient reports no history of cruelty to animals Yes   Patient understands his/her participation is voluntary Yes  Patient washes hands before animal contact Yes  Patient washes hands after animal contact Yes  Behavioral Response: Did not attend.   Laureen Ochs Abas Leicht, LRT/CTRS        Staysha Truby L 11/25/2017 2:55 PM

## 2017-11-25 NOTE — Progress Notes (Signed)
[facility] MD Progress Note  11/25/2017 1:15 PM [name]  MRN:  [id number]   Subjective: [name] reports, "My mood isn't too bad, but I feel physically weak. I started getting weak at home 2 weeks prior to coming to the hospital. My balance is off, that is why I'm in a wheel chair."  Objective: [name] is seen, chart reviewed. He is alert & oriented. Seen sitting in a wheelchair in the day room. He denies any substance withdrawal symptoms at this time. Denies suicidal or homicidal ideations during this assessment. and does not appear to be internally preoccupied. He reports mproved mood and his affect is congruent with mood. He is complaining of generalized weakness. His gait & balance are currently unsteady. His blood pressure remains elevated despite being on 2 separate antihypertensive medications. A primary care consult has been made. We are awaiting primary care md's evaluation & recommendations. [name] was non-complaint with his pertinent home medications prior to admission. However, he has been complaint with his current treatment regimen (medications). He denies any side effects or adverse reactions. He is currently using wheel chair to help his mobility within the unit. Diabetic consult had reviewed patient's diabetes management & made some recommended as noted below & already in progress. He denies any other issues other than the ones presented already. At this time, denies any SIHI, AVH, delusional thoughts or paranoia. Support, encouragement  and reassurances was provided.  Principal Problem: Schizoaffective disorder, bipolar type ([place]) Diagnosis:   Patient Active Problem List   Diagnosis Date Noted  . Schizoaffective disorder, bipolar type ([place]) [F25.0] 11/22/2017    Priority: High  . Alcohol use disorder, severe, dependence ([place]) [F10.20] 08/30/2015    Priority: Medium  . Trauma [T14.90XA]   . Tachycardia [R00.0] 03/03/2017  . Alcohol abuse [F10.10]   . Cocaine abuse  ([place]) [F14.10]   . Hypertensive urgency [I16.0]   . Tachycardia [R00.0]   . Cocaine abuse with cocaine-induced mood disorder ([place]) [F14.14] 12/05/2016  . Cerebrovascular disease [I67.9] 09/02/2015  . 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 Malignant hypertension [I10] 08/30/2015  . Alcohol withdrawal ([place]) [F10.239] 08/30/2015  . 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 Thrombocytopenia ([place]) [D69.6] 08/27/2015  . Tobacco use disorder [F17.200] 08/26/2015  . Hyperlipidemia [E78.5] 04/28/2015  . Schizoaffective disorder, depressive type ([place]) [F25.1]   . Gout [M10.9] 08/09/2013  . Diabetes mellitus ([place]) [E11.9] 07/09/2012  . Polysubstance abuse ([place]) [F19.10] 07/09/2012   Total Time spent with patient: 15 minutes  Past Psychiatric History: Schizophrenia, Bipolar disorder.  Past Medical History:  Past Medical History:  Diagnosis Date  . Alcohol abuse   . Arthritis   . Crack cocaine use   . Depression   . Diabetes mellitus   . Drug abuse ([place])   . DTs (delirium tremens) ([place])    history of   . Gout   . Noncompliance with medication regimen   . Schizophrenia ([place])   . Stroke ([place])   . Uncontrolled hypertension     Past Surgical History:  Procedure Laterality Date  . MANDIBLE RECONSTRUCTION    . TONSILLECTOMY     Family History:  Family History  Problem Relation Age of Onset  . Hypertension Unknown    Family Psychiatric  History: Denies any known familial hx of mental illness.  Social History:  Social History   Substance and Sexual Activity  Alcohol Use Yes  . Alcohol/week: 0.6 -  1.2 oz  . Types: 1 - 2 Cans of beer per week   Comment: [facility]     Social History   Substance and Sexual Activity  Drug Use Yes  . Types: "Crack" cocaine, Cocaine    Social History   Socioeconomic History  . Marital  status: Legally Separated    Spouse name: None  . Number of children: None  . Years of education: None  . Highest education level: None  Social Needs  . Financial resource strain: None  . Food insecurity - worry: None  . Food insecurity - inability: None  . Transportation needs - medical: None  . Transportation needs - non-medical: None  Occupational History  . None  Tobacco Use  . Smoking status: Current Every Day Smoker    Packs/day: 0.25    Types: Cigarettes  . Smokeless tobacco: Never Used  Substance and Sexual Activity  . Alcohol use: Yes    Alcohol/week: 0.6 - 1.2 oz    Types: 1 - 2 Cans of beer per week    Comment: [facility]  . Drug use: Yes    Types: "Crack" cocaine, Cocaine  . Sexual activity: None  Other Topics Concern  . None  Social History Narrative  . None   Additional Social History:   Sleep: Fair  Appetite:  Fair  Current Medications: Current Facility-Administered Medications  Medication Dose Route Frequency Provider Last Rate Last Dose  . acetaminophen (TYLENOL) tablet 650 mg  650 mg Oral Q6H PRN [name], FNP   650 mg at 11/23/17 2026  . alum & mag hydroxide-simeth (MAALOX/MYLANTA) 200-200-20 MG/5ML suspension 30 mL  30 mL Oral Q4H PRN Withrow, John C, FNP      . amLODipine (NORVASC) tablet 10 mg  10 mg Oral Daily Withrow, John C, FNP   10 mg at 11/25/17 0800  . gabapentin (NEURONTIN) capsule 200 mg  200 mg Oral TID PC & HS Nwoko, Agnes I, NP   200 mg at 11/25/17 1214  . hydrochlorothiazide (HYDRODIURIL) tablet 25 mg  25 mg Oral Daily [name], FNP   25 mg at 11/25/17 0641  . hydrOXYzine (ATARAX/VISTARIL) tablet 25 mg  25 mg Oral Q6H PRN Lindon Romp A, NP      . insulin aspart (novoLOG) injection 0-15 Units  0-15 Units Subcutaneous TID WC [name], FNP   3 Units at 11/25/17 1223  . insulin glargine (LANTUS) injection 10 Units  10 Units Subcutaneous QHS Derrill Center, NP   10 Units at 11/24/17 2136  . loperamide (IMODIUM)  capsule 2-4 mg  2-4 mg Oral PRN Lindon Romp A, NP      . LORazepam (ATIVAN) tablet 1 mg  1 mg Oral Q6H PRN Lindon Romp A, NP      . LORazepam (ATIVAN) tablet 1 mg  1 mg Oral BID Rozetta Nunnery, NP       Followed by  . [START ON 11/27/2017] LORazepam (ATIVAN) tablet 1 mg  1 mg Oral Daily Lindon Romp A, NP      . magnesium hydroxide (MILK OF MAGNESIA) suspension 30 mL  30 mL Oral Daily PRN Withrow, John C, FNP      . metFORMIN (GLUCOPHAGE) tablet 500 mg  500 mg Oral BID WC Withrow, John C, FNP   500 mg at 11/25/17 0800  . nicotine polacrilex (NICORETTE) gum 2 mg  2 mg Oral PRN Pennelope Bracken, MD      . ondansetron (ZOFRAN-ODT)  disintegrating tablet 4 mg  4 mg Oral Q6H PRN Lindon Romp A, NP      . potassium chloride SA (K-DUR,KLOR-CON) CR tablet 20 mEq  20 mEq Oral Daily Nwoko, Agnes I, NP   20 mEq at 11/25/17 0800  . risperiDONE (RISPERDAL) tablet 1 mg  1 mg Oral BID Pennelope Bracken, MD   1 mg at 11/25/17 0800  . thiamine (VITAMIN B-1) tablet 100 mg  100 mg Oral Daily Withrow, Elyse Jarvis, FNP   100 mg at 11/25/17 0932   Or  . thiamine (B-1) injection 100 mg  100 mg Intravenous Daily Withrow, John C, FNP      . traZODone (DESYREL) tablet 50 mg  50 mg Oral QHS,MR X 1 Derrill Center, NP   50 mg at 11/24/17 2137   Lab Results:  Results for orders placed or performed during the hospital encounter of 11/22/17 (from the past 48 hour(s))  Glucose, capillary     Status: Abnormal   Collection Time: 11/23/17  4:50 PM  Result Value Ref Range   Glucose-Capillary 250 (H) 65 - 99 mg/dL  Glucose, capillary     Status: Abnormal   Collection Time: 11/24/17  6:02 AM  Result Value Ref Range   Glucose-Capillary 214 (H) 65 - 99 mg/dL   Comment 1 Notify RN   Prolactin     Status: Abnormal   Collection Time: 11/24/17  6:45 AM  Result Value Ref Range   Prolactin 17.6 (H) 4.0 - 15.2 ng/mL    Comment: (NOTE) Performed At: [facility] [place], [place] [id number] Rush Farmer MD RK:[id number] Performed at [facility], [place] [address], [place], [place] [address]   Lipid panel     Status: Abnormal   Collection Time: 11/24/17  6:45 AM  Result Value Ref Range   Cholesterol 164 0 - 200 mg/dL   Triglycerides 156 (H) <150 mg/dL   HDL 52 >40 mg/dL   Total CHOL/HDL Ratio 3.2 RATIO   VLDL 31 0 - 40 mg/dL   LDL Cholesterol 81 0 - 99 mg/dL    Comment:        Total Cholesterol/HDL:CHD Risk Coronary Heart Disease Risk Table                     Men   Women  1/2 Average Risk   3.4   3.3  Average Risk       5.0   4.4  2 X Average Risk   9.6   7.1  3 X Average Risk  23.4   11.0        Use the calculated Patient Ratio above and the CHD Risk Table to determine the patient's CHD Risk.        ATP III CLASSIFICATION (LDL):  <100     mg/dL   Optimal  100-129  mg/dL   Near or Above                    Optimal  130-159  mg/dL   Borderline  160-189  mg/dL   High  >190     mg/dL   Very High Performed at [facility] [address], [place], [place] [address]   Hemoglobin A1c     Status: Abnormal   Collection Time: 11/24/17  6:45 AM  Result Value Ref Range   Hgb A1c MFr Bld 8.6 (H) 4.8 - 5.6 %    Comment: (  NOTE) Pre diabetes:          5.7%-6.4% Diabetes:              >6.4% Glycemic control for   <7.0% adults with diabetes    Mean Plasma Glucose 200.12 mg/dL    Comment: Performed at [facility] [address], [place], [place] [address]  TSH     Status: None   Collection Time: 11/24/17  6:45 AM  Result Value Ref Range   TSH 1.375 0.350 - 4.500 uIU/mL    Comment: Performed by a 3rd Generation assay with a functional sensitivity of <=0.01 uIU/mL. Performed at [facility], [place] [address] [place], [place] [address]   Glucose, capillary     Status: Abnormal   Collection Time: 11/24/17 10:13 AM  Result Value Ref Range   Glucose-Capillary 275 (H) 65 - 99 mg/dL  Glucose, capillary     Status:  Abnormal   Collection Time: 11/24/17 11:54 AM  Result Value Ref Range   Glucose-Capillary 239 (H) 65 - 99 mg/dL  Glucose, capillary     Status: Abnormal   Collection Time: 11/24/17  5:00 PM  Result Value Ref Range   Glucose-Capillary 265 (H) 65 - 99 mg/dL  Glucose, capillary     Status: Abnormal   Collection Time: 11/24/17  8:32 PM  Result Value Ref Range   Glucose-Capillary 159 (H) 65 - 99 mg/dL  Glucose, capillary     Status: Abnormal   Collection Time: 11/25/17  6:38 AM  Result Value Ref Range   Glucose-Capillary 216 (H) 65 - 99 mg/dL  Glucose, capillary     Status: Abnormal   Collection Time: 11/25/17 12:18 PM  Result Value Ref Range   Glucose-Capillary 182 (H) 65 - 99 mg/dL   Blood Alcohol level:  Lab Results  Component Value Date   ETH 190 (H) 11/22/2017   ETH <5 81/82/9937   Metabolic Disorder Labs: Lab Results  Component Value Date   HGBA1C 8.6 (H) 11/24/2017   MPG 200.12 11/24/2017   MPG 283 03/03/2017   Lab Results  Component Value Date   PROLACTIN 17.6 (H) 11/24/2017   PROLACTIN 47.8 (H) 04/28/2015   Lab Results  Component Value Date   CHOL 164 11/24/2017   TRIG 156 (H) 11/24/2017   HDL 52 11/24/2017   CHOLHDL 3.2 11/24/2017   VLDL 31 11/24/2017   LDLCALC 81 11/24/2017   LDLCALC 15 08/29/2015   Physical Findings: AIMS: Facial and Oral Movements Muscles of Facial Expression: None, normal Lips and Perioral Area: None, normal Jaw: None, normal Tongue: None, normal,Extremity Movements Upper (arms, wrists, hands, fingers): None, normal Lower (legs, knees, ankles, toes): None, normal, Trunk Movements Neck, shoulders, hips: None, normal, Overall Severity Severity of abnormal movements (highest score from questions above): None, normal Incapacitation due to abnormal movements: None, normal Patient's awareness of abnormal movements (rate only patient's report): No Awareness, Dental Status Current problems with teeth and/or dentures?: No Does patient  usually wear dentures?: No  CIWA:  CIWA-Ar Total: 1 COWS:     Musculoskeletal: Strength & Muscle Tone: within normal limits Gait & Station: unsteady Patient leans: Complaining genralized/lower extremity weakness.  Psychiatric Specialty Exam: Physical Exam  Nursing note and vitals reviewed. Constitutional: He is oriented to person, place, and time. He appears well-developed.  Cardiovascular:  Hx. Cardiac issues (Unstable HTN)  Neurological: He is alert and oriented to person, place, and time.  Psychiatric: He has a normal mood and affect. His behavior  is normal.    Review of Systems  Psychiatric/Behavioral: Positive for depression ("Improving"), hallucinations (Hx. psychosis) and substance abuse ( Alcohol & Cocaine use disorder). Negative for memory loss and suicidal ideas. The patient has insomnia (Improving". The patient is not nervous/anxious.   All other systems reviewed and are negative.   Blood pressure (!) 140/103, pulse 98, temperature 98 F (36.7 C), temperature source Oral, resp. rate 18, height 5\' 6"  (1.676 m), weight 86 kg (189 lb 9.5 oz), SpO2 99 %.Body mass index is 30.6 kg/m.  General Appearance: Disheveled  Eye Contact:  Fair  Speech:  Clear and Coherent and Normal Rate  Volume:  Decreased  Mood:  "My mood is improving"  Affect:  Depressed and Flat  Thought Process:  Coherent, Goal Directed and Descriptions of Associations: Tangential  Orientation:  Full (Time, Place, and Person)  Thought Content:  Logical denies auditory or visual hallucination.  Does not appear to be internally preoccupied.   Suicidal Thoughts:  Denies  Homicidal Thoughts:  Denies  Memory:  Immediate;   Fair Recent;   Fair  Judgement:  Impaired  Insight:  Shallow  Psychomotor Activity:  Normal  Concentration:  Concentration: Fair and Attention Span: Fair  Recall:  [employer] of Knowledge:  Fair  Language:  Good  Akathisia:  Negative  Handed:  Right  AIMS (if indicated):     Assets:   Communication Skills Desire for Improvement Housing Physical Health Resilience  ADL's:  Intact  Cognition:  WNL  Sleep:  Number of Hours: 4.75   Treatment Plan Summary: Daily contact with patient to assess and evaluate symptoms and progress in treatment and Medication management: Patient reports improved mood, however, reports generalized weakness. His gait & balance are currently unsteady. Blood pressure remains elevated. Primary care evaluation consulted.  Will continue today 11/25/2017 plan as below except where it is noted.  Mood stabilization:  -Continue with Risperidone 1 mg PO BID  -Continue with  Neurontin 200 mg  TID 200 mg  PO    Insomnia               -Continue with Trazodone 50 mg for insomnia  Alcohol  Detox              - Continue  on CIWA/ Ativan detox Protocols  HTN:             -Continue Norvasc 10 mg po daily,            - Continue HCTZ 25 mg po daily for hypertension  DM:            - Metformin 500 mg po bid for DM. Will continue glucose checks           - Continue  Lantus 10 units  (see DM consult).                - Patient is currently using wheel chair to aid his mobility due to generalized weakness.            - Will continue to monitor vitals, medication compliance and treatment side effects during this course of hospitalization.             - Reviewed labs; A1C 8.6  Elevated, triglyceride 156 ,BAL - 190, UDS - positive for cocaine            - CSW will start working on the discharge disposition plan.             -  Patient to participate in therapeutic milieu  [name] Spar, NP, PMHNP, FNP-BC 11/25/2017, 1:14 PMPatient ID: [name], male   DOB: 1961-05-28, 57 y.o.   MRN: [id number]

## 2017-11-26 ENCOUNTER — Emergency Department (HOSPITAL_COMMUNITY)
Admission: EM | Admit: 2017-11-26 | Discharge: 2017-11-26 | Discharge status: Absent without leave | Payer: Medicaid Other

## 2017-11-26 ENCOUNTER — Encounter (HOSPITAL_COMMUNITY): Payer: Self-pay | Admitting: Emergency Medicine

## 2017-11-26 ENCOUNTER — Inpatient Hospital Stay (HOSPITAL_COMMUNITY): Payer: Medicaid Other

## 2017-11-26 LAB — BASIC METABOLIC PANEL
ANION GAP: 11 (ref 5–15)
BUN: 16 mg/dL (ref 6–20)
CHLORIDE: 96 mmol/L — AB (ref 101–111)
CO2: 28 mmol/L (ref 22–32)
Calcium: 9.1 mg/dL (ref 8.9–10.3)
Creatinine, Ser: 0.94 mg/dL (ref 0.61–1.24)
GFR calc Af Amer: >60 mL/min (ref >60)
GFR calc non Af Amer: >60 mL/min (ref >60)
GLUCOSE: 238 mg/dL — AB (ref 65–99)
Potassium: 3 mmol/L — ABNORMAL LOW (ref 3.5–5.1)
Sodium: 135 mmol/L (ref 135–145)

## 2017-11-26 LAB — CBG MONITORING, ED
GLUCOSE-CAPILLARY: 169 mg/dL — AB (ref 65–99)
Glucose-Capillary: 195 mg/dL — ABNORMAL HIGH (ref 65–99)
Glucose-Capillary: 196 mg/dL — ABNORMAL HIGH (ref 65–99)

## 2017-11-26 LAB — CBC
HEMATOCRIT: 42.5 % (ref 39.0–52.0)
HEMOGLOBIN: 14.3 g/dL (ref 13.0–17.0)
MCH: 30.2 pg (ref 26.0–34.0)
MCHC: 33.6 g/dL (ref 30.0–36.0)
MCV: 89.9 fL (ref 78.0–100.0)
Platelets: 121 10*3/uL — ABNORMAL LOW (ref 150–400)
RBC: 4.73 MIL/uL (ref 4.22–5.81)
RDW: 12.7 % (ref 11.5–15.5)
WBC: 8 10*3/uL (ref 4.0–10.5)

## 2017-11-26 LAB — MAGNESIUM: Magnesium: 1.3 mg/dL — ABNORMAL LOW (ref 1.7–2.4)

## 2017-11-26 LAB — GLUCOSE, CAPILLARY
Glucose-Capillary: 185 mg/dL — ABNORMAL HIGH (ref 65–99)
Glucose-Capillary: 243 mg/dL — ABNORMAL HIGH (ref 65–99)

## 2017-11-26 MED ORDER — SODIUM CHLORIDE 0.9 % IV BOLUS (SEPSIS)
1000.0000 mL | Freq: Once | INTRAVENOUS | Status: AC
  Administered 2017-11-26: 1000 mL via INTRAVENOUS

## 2017-11-26 MED ORDER — SODIUM CHLORIDE 0.9 % IV BOLUS (SEPSIS)
1000.0000 mL | Freq: Once | INTRAVENOUS | Status: AC
  Administered 2017-11-27: 1000 mL via INTRAVENOUS

## 2017-11-26 MED ORDER — LORAZEPAM 1 MG PO TABS: 0.0000 mg | ORAL_TABLET | Freq: Two times a day (BID) | ORAL | Status: DC

## 2017-11-26 MED ORDER — LORAZEPAM 2 MG/ML IJ SOLN: 0.0000 mg | Freq: Two times a day (BID) | INTRAMUSCULAR | Status: DC

## 2017-11-26 MED ORDER — LABETALOL HCL 5 MG/ML IV SOLN
20.0000 mg | Freq: Once | INTRAVENOUS | Status: AC
  Administered 2017-11-27: 20 mg via INTRAVENOUS
  Filled 2017-11-26: qty 4

## 2017-11-26 MED ORDER — THIAMINE HCL 100 MG/ML IJ SOLN: 100.0000 mg | Freq: Every day | INTRAMUSCULAR | Status: DC

## 2017-11-26 MED ORDER — LORAZEPAM 2 MG/ML IJ SOLN: 0.0000 mg | Freq: Four times a day (QID) | INTRAMUSCULAR | Status: DC

## 2017-11-26 MED ORDER — LORAZEPAM 1 MG PO TABS: 0.0000 mg | ORAL_TABLET | Freq: Four times a day (QID) | ORAL | Status: DC

## 2017-11-26 MED ORDER — VITAMIN B-1 100 MG PO TABS: 100.0000 mg | ORAL_TABLET | Freq: Every day | ORAL | Status: DC

## 2017-11-26 NOTE — Progress Notes (Signed)
Nursing 1:1 note D:Pt observed in dayroom with 1:1 sitter at side. RR even and unlabored. No distress noted.  A: 1:1 observation continues for safety  R: pt remains safe

## 2017-11-26 NOTE — Progress Notes (Signed)
Nursing 1:1 note D:Pt observed sleeping in bed with eyes closed. RR even and unlabored. No distress noted. A: 1:1 observation continues for safety  R: pt remains safe on unit.

## 2017-11-26 NOTE — Progress Notes (Signed)
Per Clydie Braun, NP, patient may transfer to [facility] via [facility]. Staff will accompany. Report called to charge RN Tim. Patient updated as to plan however unable to verbalize understanding. "I need to pick up my money" as patient reaches towards floor. (no money on floor) Patient left via wheelchair with RN at side.

## 2017-11-26 NOTE — Progress Notes (Signed)
Patient remains confused. States, "My daughter Jeannene Patella is here. She needs to get back to her room" and motions down the hall. Patient then corrects himself, "I'm just confused aren't I?" Patient sleeps on and off while sitting in wheelchair with staff at side. BP remains quite elevated even with antihypertensives and addition of apresoline (patient has received 3 doses.) Patient asymptomatic - no CP, headache or blurred vision. Remains unsteady and weak. Continues to endorse AVH with command voices to harm self and others. Denies plan, intent and verbally contracts for safety. Denies any withdrawal symptoms.  1:1 obs remains in place for patient's safety. Medications given per orders and VS monitored. MD updated on patient's VS and labs from this AM. Emotional support and reassurance given to patient.  Patient remains safe at this time.

## 2017-11-26 NOTE — [facility] Group Notes (Signed)
LCSW Group Therapy Note  11/26/2017 1:15pm  Type of Therapy and Topic:  Group Therapy: Avoiding Self-Sabotaging and Enabling Behaviors  Participation Level:  Minimal   Description of Group:   In this group, patients will learn how to identify obstacles, self-sabotaging and enabling behaviors, as well as: what are they, why do we do them and what needs these behaviors meet. Discuss unhealthy relationships and how to have positive healthy boundaries with those that sabotage and enable. Explore aspects of self-sabotage and enabling in yourself and how to limit these self-destructive behaviors in everyday life.   Therapeutic Goals: 1. Patient will identify one obstacle that relates to self-sabotage and enabling behaviors 2. Patient will identify one personal self-sabotaging or enabling behavior they did prior to admission 3. Patient will state a plan to change the above identified behavior 4. Patient will demonstrate ability to communicate their needs through discussion and/or role play.   Summary of Patient Progress:  Patient was incoherent during group. He remained throughout it's entirety and attempted to contribute to conversation but was inaudible. Pt was with sitter.    Therapeutic Modalities:   Cognitive Behavioral Therapy Person-Centered Therapy Motivational Interviewing   [employer], LCSW 11/26/2017 3:25 PM

## 2017-11-26 NOTE — ED Notes (Signed)
Bed: [facility] Expected date:  Expected time:  Means of arrival:  Comments: 

## 2017-11-26 NOTE — Progress Notes (Signed)
[name] MD Progress Note  11/26/2017 3:27 PM [name]  MRN:  [id number]   Subjective: [name] reports, "I ain't good day. I just don't know. I can't stand on my two legs. I ain't got no strenght. I can't control what is happening to me".  Objective: [name] is seen, chart reviewed. He is alert & oriented. Seen sitting in a wheelchair in the day room. He denies any substance withdrawal symptoms at this time. Denies suicidal or homicidal ideations during this assessment. and does not appear to be internally preoccupied. He is still complaining of generalized weakness. His gait & balance are currently unsteady. His blood pressure remains elevated despite being on 2 separate antihypertensive medications. A primary care provider has been consulted. See the evaluation & recommendations. [name] was non-complaint with his pertinent home medications prior to admission. He denies any side effects or adverse reactions. He is currently using wheel chair to help his mobility within the unit. Diabetic consult had reviewed patient's diabetes management & made some recommended as noted below & already in progress. He denies any other issues other than the ones presented already. He seems like he is getting weaker & confused during this assessment. Has discussed this case with the attending psychiatrist. We will be transferring patient to the ED for medical evaluation & treatment.   Principal Problem: Schizoaffective disorder, bipolar type ([name])  Diagnosis:   Patient Active Problem List   Diagnosis Date Noted  . Schizoaffective disorder, bipolar type ([name]) [F25.0] 11/22/2017    Priority: High  . Alcohol use disorder, severe, dependence ([place]) [F10.20] 08/30/2015    Priority: Medium  . Trauma [T14.90XA]   . Tachycardia [R00.0] 03/03/2017  . Alcohol abuse [F10.10]   . Cocaine abuse ([place]) [F14.10]   . Hypertensive urgency [I16.0]   . Tachycardia [R00.0]   . Cocaine abuse with cocaine-induced  mood disorder ([address]) [F14.14] 12/05/2016  . Cerebrovascular disease [I67.9] 09/02/2015  . 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 Malignant hypertension [I10] 08/30/2015  . Alcohol withdrawal ([place]) [F10.239] 08/30/2015  . 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 Thrombocytopenia ([place]) [D69.6] 08/27/2015  . Tobacco use disorder [F17.200] 08/26/2015  . Hyperlipidemia [E78.5] 04/28/2015  . Schizoaffective disorder, depressive type ([place]) [F25.1]   . Gout [M10.9] 08/09/2013  . Diabetes mellitus ([place]) [E11.9] 07/09/2012  . Polysubstance abuse ([place]) [F19.10] 07/09/2012   Total Time spent with patient: 15 minutes  Past Psychiatric History: Schizophrenia, Bipolar disorder.  Past Medical History:  Past Medical History:  Diagnosis Date  . Alcohol abuse   . Arthritis   . Crack cocaine use   . Depression   . Diabetes mellitus   . Drug abuse ([place])   . DTs (delirium tremens) ([place])    history of   . Gout   . Noncompliance with medication regimen   . Schizophrenia ([place])   . Stroke ([place])   . Uncontrolled hypertension     Past Surgical History:  Procedure Laterality Date  . MANDIBLE RECONSTRUCTION    . TONSILLECTOMY     Family History:  Family History  Problem Relation Age of Onset  . Hypertension Unknown    Family Psychiatric  History: Denies any known familial hx of mental illness.  Social History:  Social History   Substance and Sexual Activity  Alcohol Use Yes  . Alcohol/week: 0.6 - 1.2 oz  . Types: 1 - 2 Cans of beer per week  Comment: [facility]     Social History   Substance and Sexual Activity  Drug Use Yes  . Types: "Crack" cocaine, Cocaine    Social History   Socioeconomic History  . Marital status: Legally Separated    Spouse name: None  . Number of children: None  . Years of education: None  . Highest  education level: None  Social Needs  . Financial resource strain: None  . Food insecurity - worry: None  . Food insecurity - inability: None  . Transportation needs - medical: None  . Transportation needs - non-medical: None  Occupational History  . None  Tobacco Use  . Smoking status: Current Every Day Smoker    Packs/day: 0.25    Types: Cigarettes  . Smokeless tobacco: Never Used  Substance and Sexual Activity  . Alcohol use: Yes    Alcohol/week: 0.6 - 1.2 oz    Types: 1 - 2 Cans of beer per week    Comment: [facility]  . Drug use: Yes    Types: "Crack" cocaine, Cocaine  . Sexual activity: None  Other Topics Concern  . None  Social History Narrative  . None   Additional Social History:   Sleep: Fair  Appetite:  Fair  Current Medications: Current Facility-Administered Medications  Medication Dose Route Frequency Provider Last Rate Last Dose  . acetaminophen (TYLENOL) tablet 650 mg  650 mg Oral Q6H PRN [name], FNP   650 mg at 11/23/17 2026  . alum & mag hydroxide-simeth (MAALOX/MYLANTA) 200-200-20 MG/5ML suspension 30 mL  30 mL Oral Q4H PRN Withrow, John C, FNP      . amLODipine (NORVASC) tablet 10 mg  10 mg Oral Daily [name], FNP   10 mg at 11/26/17 0749  . gabapentin (NEURONTIN) capsule 200 mg  200 mg Oral TID PC & HS Nwoko, Agnes I, NP   200 mg at 11/26/17 1201  . hydrALAZINE (APRESOLINE) tablet 25 mg  25 mg Oral Q8H Theodis Blaze, MD   25 mg at 11/26/17 8850  . insulin aspart (novoLOG) injection 0-15 Units  0-15 Units Subcutaneous TID WC [name], FNP   5 Units at 11/26/17 1202  . insulin glargine (LANTUS) injection 10 Units  10 Units Subcutaneous QHS Derrill Center, NP   10 Units at 11/25/17 2133  . LORazepam (ATIVAN) tablet 1 mg  1 mg Oral QID [name] E, PA-C   1 mg at 11/26/17 1201  . magnesium hydroxide (MILK OF MAGNESIA) suspension 30 mL  30 mL Oral Daily PRN Withrow, John C, FNP      . metFORMIN (GLUCOPHAGE) tablet 500 mg   500 mg Oral BID WC Withrow, John C, FNP   500 mg at 11/26/17 0749  . nicotine polacrilex (NICORETTE) gum 2 mg  2 mg Oral PRN Pennelope Bracken, MD   2 mg at 11/26/17 1050  . potassium chloride SA (K-DUR,KLOR-CON) CR tablet 20 mEq  20 mEq Oral Daily Lindell Spar I, NP   20 mEq at 11/26/17 0749  . thiamine (VITAMIN B-1) tablet 100 mg  100 mg Oral Daily Withrow, Elyse Jarvis, FNP   100 mg at 11/26/17 2774   Or  . thiamine (B-1) injection 100 mg  100 mg Intravenous Daily Withrow, Elyse Jarvis, FNP       Lab Results:  Results for orders placed or performed during the Brown encounter of 11/22/17 (from the past 48 hour(s))  Glucose, capillary  Status: Abnormal   Collection Time: 11/24/17  5:00 PM  Result Value Ref Range   Glucose-Capillary 265 (H) 65 - 99 mg/dL  Glucose, capillary     Status: Abnormal   Collection Time: 11/24/17  8:32 PM  Result Value Ref Range   Glucose-Capillary 159 (H) 65 - 99 mg/dL  Glucose, capillary     Status: Abnormal   Collection Time: 11/25/17  6:38 AM  Result Value Ref Range   Glucose-Capillary 216 (H) 65 - 99 mg/dL  Glucose, capillary     Status: Abnormal   Collection Time: 11/25/17 12:18 PM  Result Value Ref Range   Glucose-Capillary 182 (H) 65 - 99 mg/dL  Glucose, capillary     Status: Abnormal   Collection Time: 11/25/17  5:12 PM  Result Value Ref Range   Glucose-Capillary 247 (H) 65 - 99 mg/dL  Glucose, capillary     Status: Abnormal   Collection Time: 11/25/17  8:34 PM  Result Value Ref Range   Glucose-Capillary 223 (H) 65 - 99 mg/dL  Glucose, capillary     Status: Abnormal   Collection Time: 11/26/17  6:21 AM  Result Value Ref Range   Glucose-Capillary 185 (H) 65 - 99 mg/dL  Basic metabolic panel     Status: Abnormal   Collection Time: 11/26/17  6:53 AM  Result Value Ref Range   Sodium 135 135 - 145 mmol/L   Potassium 3.0 (L) 3.5 - 5.1 mmol/L   Chloride 96 (L) 101 - 111 mmol/L   CO2 28 22 - 32 mmol/L   Glucose, Bld 238 (H) 65 - 99 mg/dL   BUN  16 6 - 20 mg/dL   Creatinine, Ser 0.94 0.61 - 1.24 mg/dL   Calcium 9.1 8.9 - 10.3 mg/dL   GFR calc non Af Amer >60 >60 mL/min   GFR calc Af Amer >60 >60 mL/min    Comment: (NOTE) The eGFR has been calculated using the CKD EPI equation. This calculation has not been validated in all clinical situations. eGFR's persistently <60 mL/min signify possible Chronic Kidney Disease.    Anion gap 11 5 - 15    Comment: Performed at [facility], [place] [address], [place], [place] [address]  CBC     Status: Abnormal   Collection Time: 11/26/17  6:53 AM  Result Value Ref Range   WBC 8.0 4.0 - 10.5 K/uL   RBC 4.73 4.22 - 5.81 MIL/uL   Hemoglobin 14.3 13.0 - 17.0 g/dL   HCT 42.5 39.0 - 52.0 %   MCV 89.9 78.0 - 100.0 fL   MCH 30.2 26.0 - 34.0 pg   MCHC 33.6 30.0 - 36.0 g/dL   RDW 12.7 11.5 - 15.5 %   Platelets 121 (L) 150 - 400 K/uL    Comment: Performed at [facility], [place] [address], [place], [place] [address]  Magnesium     Status: Abnormal   Collection Time: 11/26/17  6:53 AM  Result Value Ref Range   Magnesium 1.3 (L) 1.7 - 2.4 mg/dL    Comment: Performed at [facility], [place] [address], [place], [place] [address]  Glucose, capillary     Status: Abnormal   Collection Time: 11/26/17 11:16 AM  Result Value Ref Range   Glucose-Capillary 243 (H) 65 - 99 mg/dL   Blood Alcohol level:  Lab Results  Component Value Date   ETH 190 (H) 11/22/2017   ETH <5 11/20/7251   Metabolic Disorder Labs: Lab Results  Component Value Date   HGBA1C 8.6 (H) 11/24/2017   MPG 200.12 11/24/2017   MPG 283 03/03/2017   Lab Results  Component Value Date   PROLACTIN 17.6 (H) 11/24/2017   PROLACTIN 47.8 (H) 04/28/2015   Lab Results  Component Value Date   CHOL 164 11/24/2017   TRIG 156 (H) 11/24/2017   HDL 52 11/24/2017   CHOLHDL 3.2 11/24/2017   VLDL 31 11/24/2017   LDLCALC 81 11/24/2017   LDLCALC 15 08/29/2015   Physical Findings: AIMS:  Facial and Oral Movements Muscles of Facial Expression: None, normal Lips and Perioral Area: None, normal Jaw: None, normal Tongue: None, normal,Extremity Movements Upper (arms, wrists, hands, fingers): None, normal Lower (legs, knees, ankles, toes): None, normal, Trunk Movements Neck, shoulders, hips: None, normal, Overall Severity Severity of abnormal movements (highest score from questions above): None, normal Incapacitation due to abnormal movements: None, normal Patient's awareness of abnormal movements (rate only patient's report): No Awareness, Dental Status Current problems with teeth and/or dentures?: No Does patient usually wear dentures?: No  CIWA:  CIWA-Ar Total: 1 COWS:     Musculoskeletal: Strength & Muscle Tone: within normal limits Gait & Station: unsteady Patient leans: Complaining genralized/lower extremity weakness.  Psychiatric Specialty Exam: Physical Exam  Nursing note and vitals reviewed. Constitutional: He is oriented to person, place, and time. He appears well-developed.  Cardiovascular:  Hx. Cardiac issues (Unstable HTN)  Neurological: He is alert and oriented to person, place, and time.  Psychiatric: He has a normal mood and affect. His behavior is normal.    Review of Systems  Psychiatric/Behavioral: Positive for depression ("Improving"), hallucinations (Hx. psychosis) and substance abuse ( Alcohol & Cocaine use disorder). Negative for memory loss and suicidal ideas. The patient has insomnia (Improving". The patient is not nervous/anxious.   All other systems reviewed and are negative.   Blood pressure (!) 158/118, pulse 92, temperature 97.6 F (36.4 C), resp. rate 18, height '5\' 6"'  (1.676 m), weight 86 kg (189 lb 9.5 oz), SpO2 99 %.Body mass index is 30.6 kg/m.  General Appearance: Disheveled  Eye Contact:  Fair  Speech:  Clear and Coherent and Normal Rate  Volume:  Decreased  Mood:  "My mood is improving"  Affect:  Depressed and Flat  Thought  Process:  Coherent, Goal Directed and Descriptions of Associations: Tangential  Orientation:  Full (Time, Place, and Person)  Thought Content:  Logical denies auditory or visual hallucination.  Does not appear to be internally preoccupied.   Suicidal Thoughts:  Denies  Homicidal Thoughts:  Denies  Memory:  Immediate;   Fair Recent;   Fair  Judgement:  Impaired  Insight:  Shallow  Psychomotor Activity:  Normal  Concentration:  Concentration: Fair and Attention Span: Fair  Recall:  [employer] of Knowledge:  Fair  Language:  Good  Akathisia:  Negative  Handed:  Right  AIMS (if indicated):     Assets:  Communication Skills Desire for Improvement Housing Physical Health Resilience  ADL's:  Intact  Cognition:  WNL  Sleep:  Number of Hours: 5.25   Treatment Plan Summary: Daily contact with patient to assess and evaluate symptoms and progress in treatment and Medication management: Patient reports worsening generalized weakness. His gait & balance are currently unsteady. Blood pressure remains elevated. Primary care evaluation consulted.  Will continue today 11/26/2017 plan as below except where it is noted. 01-09- 19: Will transfer to the ED for evaluation.  Mood stabilization:  -Continue with Risperidone 1 mg PO BID  -  Continue with  Neurontin 200 mg  TID 200 mg  PO    Insomnia               -Continue with Trazodone 50 mg for insomnia  Alcohol  Detox              - Continue  on CIWA/ Ativan detox Protocols  HTN:             -Continue Norvasc 10 mg po daily,            - Continue HCTZ 25 mg po daily for hypertension  DM:            - Metformin 500 mg po bid for DM. Will continue glucose checks           - Continue  Lantus 10 units  (see DM consult).                - Patient is currently using wheel chair to aid his mobility due to generalized weakness.            - Will continue to monitor vitals, medication compliance and treatment side effects during this course of  hospitalization.             - Reviewed labs; A1C 8.6  Elevated, triglyceride 156 ,BAL - 190, UDS - positive for cocaine            - CSW will start working on the discharge disposition plan.             - Patient to participate in therapeutic milieu.           - Continue 1:1 supervision.  Lindell Spar, NP, PMHNP, FNP-BC 11/26/2017, 3:27 PMPatient ID: [name], male   DOB: 08-Nov-1961, 57 y.o.   MRN: [id number]

## 2017-11-26 NOTE — Plan of Care (Signed)
Patient remains disorganized, confused and reports AVH.  Patient has been cooperative. No outbursts or periods of anger, agitation.

## 2017-11-26 NOTE — ED Provider Notes (Signed)
[facility] DEPT Provider Note   CSN: [id number] Arrival date & time: 11/26/17  1531     History   Chief Complaint Chief Complaint  Patient presents with  . Dehydration  . Hypertension  . Cough    HPI [name] is a 57 y.o. male.  Patient here from [facility] where he is being treated for hallucinations with complaint of chest pain (persistent since initial evaluation on 11/22/17) and report from nursing at [facility] that he is refusing to drink any substantial amount of fluids. No change in mental status. He is not sleeping well and has essentially been up for the past 5 days per [facility] nursing. They feel he is acutely confused. The patient only contributes that he is hungry. No reported vomiting or fever.   The history is provided by the patient. No language interpreter was used.    Past Medical History:  Diagnosis Date  . Alcohol abuse   . Arthritis   . Crack cocaine use   . Depression   . Diabetes mellitus   . Drug abuse ([place])   . DTs (delirium tremens) ([place])    history of   . Gout   . Noncompliance with medication regimen   . Schizophrenia ([place])   . Stroke ([place])   . Uncontrolled hypertension     Patient Active Problem List   Diagnosis Date Noted  . Schizoaffective disorder, bipolar type ([place]) 11/22/2017  . Trauma   . Tachycardia 03/03/2017  . Alcohol abuse   . Cocaine abuse ([place])   . Hypertensive urgency   . Tachycardia   . Cocaine abuse with cocaine-induced mood disorder ([place]) 12/05/2016  . Cerebrovascular disease 09/02/2015  . 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 Malignant hypertension 08/30/2015  . Alcohol use disorder, severe, dependence ([place]) 08/30/2015  . Alcohol withdrawal ([place]) 08/30/2015  . 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 Thrombocytopenia ([place]) 08/27/2015  . Tobacco use disorder 08/26/2015  .  Hyperlipidemia 04/28/2015  . Schizoaffective disorder, depressive type ([place])   . Gout 08/09/2013  . Diabetes mellitus ([place]) 07/09/2012  . Polysubstance abuse ([place]) 07/09/2012    Past Surgical History:  Procedure Laterality Date  . MANDIBLE RECONSTRUCTION    . TONSILLECTOMY         Home Medications    Prior to Admission medications   Medication Sig Start Date End Date Taking? Authorizing Provider  amLODipine (NORVASC) 10 MG tablet Take 1 tablet (10 mg total) by mouth daily. Patient not taking: Reported on 11/22/2017 03/06/17   Smiley Houseman, MD  cloNIDine (CATAPRES - DOSED IN MG/24 HR) 0.3 mg/24hr patch Place 1 patch (0.3 mg total) onto the skin once a week. Patient not taking: Reported on 11/22/2017 03/12/17   Smiley Houseman, MD  DULoxetine (CYMBALTA) 30 MG capsule Take 1 capsule (30 mg total) by mouth 2 (two) times daily. Patient not taking: Reported on 11/22/2017 01/09/17   Patrecia Pour, NP  hydrochlorothiazide (HYDRODIURIL) 25 MG tablet Take 1 tablet (25 mg total) by mouth daily. Patient not taking: Reported on 11/22/2017 03/06/17   Smiley Houseman, MD  hydrOXYzine (ATARAX/VISTARIL) 25 MG tablet Take 1 tablet (25 mg total) by mouth 3 (three) times daily as needed for anxiety. Patient not taking: Reported on 11/22/2017 01/09/17   Patrecia Pour, NP  meloxicam (MOBIC) 7.5 MG tablet Take 7.5 mg by mouth 2 (two) times daily as needed for pain.  [provider]  metFORMIN (GLUCOPHAGE) 500 MG tablet Take 1 tablet (500 mg total) by mouth 2 (two) times daily with a meal. Patient not taking: Reported on 11/22/2017 01/09/17   Patrecia Pour, NP  paliperidone (INVEGA SUSTENNA) 156 MG/ML SUSP injection Inject 156 mg into the muscle every 30 (thirty) days.    [provider]  tiZANidine (ZANAFLEX) 2 MG tablet Take 2-4 mg by mouth every 8 (eight) hours as needed for muscle spasms.    [provider]    Family History Family History  Problem Relation Age  of Onset  . Hypertension Unknown     Social History Social History   Tobacco Use  . Smoking status: Current Every Day Smoker    Packs/day: 0.25    Types: Cigarettes  . Smokeless tobacco: Never Used  Substance Use Topics  . Alcohol use: Yes    Alcohol/week: 0.6 - 1.2 oz    Types: 1 - 2 Cans of beer per week    Comment: [facility]  . Drug use: Yes    Types: "Crack" cocaine, Cocaine     Allergies   Patient has no known allergies.   Review of Systems Review of Systems  Reason unable to perform ROS: Patient is noncommunicative.     Physical Exam Updated Vital Signs BP (!) 168/136   Pulse 77   Temp 97.6 F (36.4 C)   Resp 18   Ht 5\' 6"  (1.676 m)   Wt 86 kg (189 lb 9.5 oz)   SpO2 99%   BMI 30.60 kg/m   Physical Exam  Constitutional: He appears well-developed and well-nourished.  HENT:  Head: Normocephalic.  Mouth/Throat: Mucous membranes are dry.  Eyes: Conjunctivae are normal.  Neck: Normal range of motion. Neck supple.  Cardiovascular: Normal rate and regular rhythm.  No murmur heard. Pulmonary/Chest: Effort normal and breath sounds normal. He has no wheezes. He has no rales.  Abdominal: Soft. Bowel sounds are normal. There is no tenderness. There is no rebound and no guarding.  Musculoskeletal: Normal range of motion.  Neurological: He is alert.  Skin: Skin is warm and dry. No rash noted.  Psychiatric: He has a normal mood and affect.     ED Treatments / Results  Labs (all labs ordered are listed, but only abnormal results are displayed) Labs Reviewed  GLUCOSE, CAPILLARY - Abnormal; Notable for the following components:      Result Value   Glucose-Capillary 110 (*)    All other components within normal limits  GLUCOSE, CAPILLARY - Abnormal; Notable for the following components:   Glucose-Capillary 225 (*)    All other components within normal limits  GLUCOSE, CAPILLARY - Abnormal; Notable for the following components:   Glucose-Capillary 272  (*)    All other components within normal limits  GLUCOSE, CAPILLARY - Abnormal; Notable for the following components:   Glucose-Capillary 250 (*)    All other components within normal limits  PROLACTIN - Abnormal; Notable for the following components:   Prolactin 17.6 (*)    All other components within normal limits  LIPID PANEL - Abnormal; Notable for the following components:   Triglycerides 156 (*)    All other components within normal limits  HEMOGLOBIN A1C - Abnormal; Notable for the following components:   Hgb A1c MFr Bld 8.6 (*)    All other components within normal limits  GLUCOSE, CAPILLARY - Abnormal; Notable for the following components:   Glucose-Capillary 214 (*)    All other components  within normal limits  GLUCOSE, CAPILLARY - Abnormal; Notable for the following components:   Glucose-Capillary 275 (*)    All other components within normal limits  GLUCOSE, CAPILLARY - Abnormal; Notable for the following components:   Glucose-Capillary 239 (*)    All other components within normal limits  GLUCOSE, CAPILLARY - Abnormal; Notable for the following components:   Glucose-Capillary 265 (*)    All other components within normal limits  GLUCOSE, CAPILLARY - Abnormal; Notable for the following components:   Glucose-Capillary 159 (*)    All other components within normal limits  GLUCOSE, CAPILLARY - Abnormal; Notable for the following components:   Glucose-Capillary 216 (*)    All other components within normal limits  GLUCOSE, CAPILLARY - Abnormal; Notable for the following components:   Glucose-Capillary 182 (*)    All other components within normal limits  GLUCOSE, CAPILLARY - Abnormal; Notable for the following components:   Glucose-Capillary 247 (*)    All other components within normal limits  GLUCOSE, CAPILLARY - Abnormal; Notable for the following components:   Glucose-Capillary 223 (*)    All other components within normal limits  BASIC METABOLIC PANEL -  Abnormal; Notable for the following components:   Potassium 3.0 (*)    Chloride 96 (*)    Glucose, Bld 238 (*)    All other components within normal limits  CBC - Abnormal; Notable for the following components:   Platelets 121 (*)    All other components within normal limits  MAGNESIUM - Abnormal; Notable for the following components:   Magnesium 1.3 (*)    All other components within normal limits  GLUCOSE, CAPILLARY - Abnormal; Notable for the following components:   Glucose-Capillary 185 (*)    All other components within normal limits  GLUCOSE, CAPILLARY - Abnormal; Notable for the following components:   Glucose-Capillary 243 (*)    All other components within normal limits  CBG MONITORING, ED - Abnormal; Notable for the following components:   Glucose-Capillary 195 (*)    All other components within normal limits  CBG MONITORING, ED - Abnormal; Notable for the following components:   Glucose-Capillary 169 (*)    All other components within normal limits  TSH  VITAMIN B1    EKG  EKG Interpretation None       Radiology Dg Chest 2 View  Result Date: 11/26/2017 CLINICAL DATA:  Dehydration.  Cough. EXAM: CHEST  2 VIEW COMPARISON:  11/22/2017 FINDINGS: Cardiomediastinal silhouette is normal. Mediastinal contours appear intact. Tortuosity and calcific atherosclerotic disease of the aorta. There is no evidence of focal airspace consolidation, pleural effusion or pneumothorax. Osseous structures are without acute abnormality. Soft tissues are grossly normal. IMPRESSION: No active cardiopulmonary disease. Tortuosity and calcific atherosclerotic disease of the aorta. Electronically Signed   By: Fidela Salisbury M.D.   On: 11/26/2017 18:21    Procedures Procedures (including critical care time)  Medications Ordered in ED Medications  acetaminophen (TYLENOL) tablet 650 mg (650 mg Oral Given 11/23/17 2026)  alum & mag hydroxide-simeth (MAALOX/MYLANTA) 200-200-20 MG/5ML  suspension 30 mL (not administered)  magnesium hydroxide (MILK OF MAGNESIA) suspension 30 mL (not administered)  thiamine (VITAMIN B-1) tablet 100 mg (100 mg Oral Given 11/26/17 0749)    Or  thiamine (B-1) injection 100 mg ( Intravenous See Alternative 11/26/17 0749)  insulin aspart (novoLOG) injection 0-15 Units (5 Units Subcutaneous Given 11/26/17 1202)  amLODipine (NORVASC) tablet 10 mg (10 mg Oral Given 11/26/17 0749)  metFORMIN (GLUCOPHAGE) tablet 500 mg (500  mg Oral Given 11/26/17 0749)  nicotine polacrilex (NICORETTE) gum 2 mg (2 mg Oral Given 11/26/17 1050)  LORazepam (ATIVAN) tablet 1 mg (not administered)  hydrOXYzine (ATARAX/VISTARIL) tablet 25 mg (not administered)  loperamide (IMODIUM) capsule 2-4 mg (not administered)  ondansetron (ZOFRAN-ODT) disintegrating tablet 4 mg (not administered)  gabapentin (NEURONTIN) capsule 200 mg (200 mg Oral Given 11/26/17 1201)  potassium chloride SA (K-DUR,KLOR-CON) CR tablet 20 mEq (20 mEq Oral Given 11/26/17 0749)  insulin glargine (LANTUS) injection 10 Units (10 Units Subcutaneous Given 11/25/17 2133)  hydrALAZINE (APRESOLINE) tablet 25 mg (25 mg Oral Given 11/26/17 0629)  LORazepam (ATIVAN) tablet 1 mg (1 mg Oral Given 11/26/17 1201)  sodium chloride 0.9 % bolus 1,000 mL (not administered)  cloNIDine (CATAPRES) tablet 0.1 mg (0.1 mg Oral Given 11/22/17 2254)  LORazepam (ATIVAN) tablet 1 mg (1 mg Oral Given 11/24/17 0809)    Followed by  LORazepam (ATIVAN) tablet 1 mg (1 mg Oral Given 11/25/17 0800)  labetalol (NORMODYNE) tablet 100 mg (0 mg Oral Duplicate 01/17/66 1245)  cloNIDine (CATAPRES) tablet 0.1 mg (0 mg Oral Duplicate 8/0/99 8338)  labetalol (NORMODYNE) 100 MG tablet (100 mg  Given 11/23/17 1717)  cloNIDine (CATAPRES) 0.1 MG tablet (0.1 mg  Given 11/23/17 1718)  cloNIDine (CATAPRES) tablet 0.1 mg (0.1 mg Oral Given 11/23/17 2135)  cloNIDine (CATAPRES) tablet 0.1 mg (0.1 mg Oral Given 11/24/17 1627)     Initial Impression / Assessment and Plan / ED Course  I have  reviewed the triage vital signs and the nursing notes.  Pertinent labs & imaging results that were available during my care of the patient were reviewed by me and considered in my medical decision making (see chart for details).     Patient here from [facility] for confusion and hypertension. Confirmed with [facility] nursing that he has been taking his medications for blood pressure, including TID hydralazine that was started yesterday and continues to be significantly hypertensive. They feel his confusion and hallucinations are worse. ?DT's.   The patient appears dry and IVF's are started. CIWA protocol in place. Blood pressure significantly elevated. IV Labatolol provided.   Patient will need admission for hypertensive urgency and to continue CIWA. [facility] accepts for admission.  Final Clinical Impressions(s) / ED Diagnoses   Final diagnoses:  None   1. Hypertensive urgency 2. hallucinations  ED Discharge Orders    None       Dennie Bible 11/27/17 0720    Davonna Belling, MD 11/28/17 2342

## 2017-11-26 NOTE — Progress Notes (Signed)
Recreation Therapy Notes  Date: 11/26/17 Time: 0930 Location: 300 Hall Dayroom  Group Topic: Stress Management  Goal Area(s) Addresses:  Patient will verbalize importance of using healthy stress management.  Patient will identify positive emotions associated with healthy stress management.   Behavioral Response: Engaged  Intervention: Stress Management  Activity : Guided Imagery.  LRT introduced the stress management technique of guided imagery.  LRT read a script that allowed patients to visualize themselves along the beach.  Patients were to listen and follow along as script was read to engage in the activity.  Education:  Stress Management, Discharge Planning.   Education Outcome: Acknowledges edcuation/In group clarification offered/Needs additional education  Clinical Observations/Feedback: Pt attended group.   Victorino Sparrow, LRT/CTRS    Ria Comment, Markiya Keefe A 11/26/2017 10:55 AM

## 2017-11-26 NOTE — ED Triage Notes (Signed)
Patient here from behavioral health with complaints of dehydration, patient will not drink anything. Confused and hypertensive. Also reports cough x3 days. Nurse reports that patient has been up for days.

## 2017-11-26 NOTE — Progress Notes (Signed)
Patient observed resting in bed. RR WNL, even and unlabored. NAD. Patient on 1:1 for safety as he remains confused, unsteady. Patient safe at this time.

## 2017-11-26 NOTE — ED Notes (Signed)
Bed: WA01 Expected date:  Expected time:  Means of arrival:  Comments: Hall A 

## 2017-11-27 ENCOUNTER — Encounter (HOSPITAL_COMMUNITY): Payer: Self-pay

## 2017-11-27 ENCOUNTER — Observation Stay (HOSPITAL_COMMUNITY)
Admission: AD | Admit: 2017-11-27 | Discharge: 2017-11-28 | Disposition: A | Discharge status: Home / Self Care | Payer: Medicaid Other | Source: Ambulatory Visit | Attending: Internal Medicine | Admitting: Internal Medicine

## 2017-11-27 ENCOUNTER — Other Ambulatory Visit: Payer: Self-pay

## 2017-11-27 DIAGNOSIS — E119 Type 2 diabetes mellitus without complications: Secondary | ICD-10-CM | POA: Insufficient documentation

## 2017-11-27 DIAGNOSIS — Z79899 Other long term (current) drug therapy: Secondary | ICD-10-CM | POA: Insufficient documentation

## 2017-11-27 DIAGNOSIS — E876 Hypokalemia: Secondary | ICD-10-CM

## 2017-11-27 DIAGNOSIS — Z7984 Long term (current) use of oral hypoglycemic drugs: Secondary | ICD-10-CM | POA: Insufficient documentation

## 2017-11-27 DIAGNOSIS — I160 Hypertensive urgency: Secondary | ICD-10-CM | POA: Diagnosis not present

## 2017-11-27 DIAGNOSIS — F250 Schizoaffective disorder, bipolar type: Secondary | ICD-10-CM | POA: Diagnosis present

## 2017-11-27 DIAGNOSIS — F1410 Cocaine abuse, uncomplicated: Secondary | ICD-10-CM | POA: Insufficient documentation

## 2017-11-27 DIAGNOSIS — F329 Major depressive disorder, single episode, unspecified: Secondary | ICD-10-CM | POA: Insufficient documentation

## 2017-11-27 DIAGNOSIS — F1020 Alcohol dependence, uncomplicated: Secondary | ICD-10-CM | POA: Diagnosis present

## 2017-11-27 DIAGNOSIS — Z8673 Personal history of transient ischemic attack (TIA), and cerebral infarction without residual deficits: Secondary | ICD-10-CM | POA: Insufficient documentation

## 2017-11-27 DIAGNOSIS — F209 Schizophrenia, unspecified: Secondary | ICD-10-CM | POA: Insufficient documentation

## 2017-11-27 LAB — CBG MONITORING, ED
Glucose-Capillary: 153 mg/dL — ABNORMAL HIGH (ref 65–99)
Glucose-Capillary: 153 mg/dL — ABNORMAL HIGH (ref 65–99)

## 2017-11-27 LAB — GLUCOSE, CAPILLARY
GLUCOSE-CAPILLARY: 130 mg/dL — AB (ref 65–99)
Glucose-Capillary: 178 mg/dL — ABNORMAL HIGH (ref 65–99)

## 2017-11-27 MED ORDER — LORAZEPAM 2 MG/ML IJ SOLN: 1.0000 mg | Freq: Four times a day (QID) | INTRAMUSCULAR | Status: DC | PRN

## 2017-11-27 MED ORDER — INSULIN ASPART 100 UNIT/ML ~~LOC~~ SOLN
0.0000 [IU] | Freq: Three times a day (TID) | SUBCUTANEOUS | Status: DC
  Administered 2017-11-27: 3 [IU] via SUBCUTANEOUS
  Administered 2017-11-27: 2 [IU] via SUBCUTANEOUS
  Administered 2017-11-27 – 2017-11-28 (×3): 3 [IU] via SUBCUTANEOUS
  Filled 2017-11-27 (×3): qty 1

## 2017-11-27 MED ORDER — AMLODIPINE BESYLATE 10 MG PO TABS
10.0000 mg | ORAL_TABLET | Freq: Every day | ORAL | Status: DC
  Administered 2017-11-27 – 2017-11-28 (×2): 10 mg via ORAL
  Filled 2017-11-27: qty 4
  Filled 2017-11-27 (×2): qty 1
  Filled 2017-11-27: qty 2

## 2017-11-27 MED ORDER — ENOXAPARIN SODIUM 40 MG/0.4ML ~~LOC~~ SOLN
40.0000 mg | SUBCUTANEOUS | Status: DC
  Administered 2017-11-27 – 2017-11-28 (×2): 40 mg via SUBCUTANEOUS
  Filled 2017-11-27 (×2): qty 0.4

## 2017-11-27 MED ORDER — SODIUM CHLORIDE 0.9 % IV SOLN
4.0000 mg | Freq: Four times a day (QID) | INTRAVENOUS | Status: DC | PRN
  Filled 2017-11-27: qty 2

## 2017-11-27 MED ORDER — METFORMIN HCL 500 MG PO TABS
500.0000 mg | ORAL_TABLET | Freq: Two times a day (BID) | ORAL | Status: DC
  Administered 2017-11-27 – 2017-11-28 (×4): 500 mg via ORAL
  Filled 2017-11-27 (×4): qty 1

## 2017-11-27 MED ORDER — THIAMINE HCL 100 MG/ML IJ SOLN: 100.0000 mg | Freq: Every day | INTRAMUSCULAR | Status: DC

## 2017-11-27 MED ORDER — CLONIDINE HCL 0.3 MG/24HR TD PTWK
0.3000 mg | MEDICATED_PATCH | TRANSDERMAL | Status: DC
  Administered 2017-11-27: 0.3 mg via TRANSDERMAL
  Filled 2017-11-27: qty 1

## 2017-11-27 MED ORDER — ONDANSETRON HCL 4 MG PO TABS: 4.0000 mg | ORAL_TABLET | Freq: Four times a day (QID) | ORAL | Status: DC | PRN

## 2017-11-27 MED ORDER — LORAZEPAM 0.5 MG PO TABS: 0.0000 mg | ORAL_TABLET | Freq: Two times a day (BID) | ORAL | Status: DC

## 2017-11-27 MED ORDER — FOLIC ACID 1 MG PO TABS
1.0000 mg | ORAL_TABLET | Freq: Every day | ORAL | Status: DC
  Administered 2017-11-27 – 2017-11-28 (×2): 1 mg via ORAL
  Filled 2017-11-27 (×2): qty 1

## 2017-11-27 MED ORDER — LORAZEPAM 0.5 MG PO TABS: 1.0000 mg | ORAL_TABLET | Freq: Four times a day (QID) | ORAL | Status: DC | PRN

## 2017-11-27 MED ORDER — INSULIN GLARGINE 100 UNIT/ML ~~LOC~~ SOLN
10.0000 [IU] | Freq: Every day | SUBCUTANEOUS | Status: DC
  Administered 2017-11-27: 10 [IU] via SUBCUTANEOUS
  Filled 2017-11-27 (×2): qty 0.1

## 2017-11-27 MED ORDER — ACETAMINOPHEN 325 MG PO TABS
650.0000 mg | ORAL_TABLET | Freq: Four times a day (QID) | ORAL | Status: DC | PRN
  Administered 2017-11-28: 650 mg via ORAL
  Filled 2017-11-27: qty 2

## 2017-11-27 MED ORDER — LABETALOL HCL 5 MG/ML IV SOLN
20.0000 mg | INTRAVENOUS | Status: DC | PRN
  Administered 2017-11-27: 20 mg via INTRAVENOUS
  Filled 2017-11-27: qty 4

## 2017-11-27 MED ORDER — VITAMIN B-1 100 MG PO TABS
100.0000 mg | ORAL_TABLET | Freq: Every day | ORAL | Status: DC
  Administered 2017-11-27 – 2017-11-28 (×2): 100 mg via ORAL
  Filled 2017-11-27 (×2): qty 1

## 2017-11-27 MED ORDER — ACETAMINOPHEN 650 MG RE SUPP: 650.0000 mg | Freq: Four times a day (QID) | RECTAL | Status: DC | PRN

## 2017-11-27 MED ORDER — GABAPENTIN 100 MG PO CAPS
200.0000 mg | ORAL_CAPSULE | Freq: Three times a day (TID) | ORAL | Status: DC
  Administered 2017-11-27 – 2017-11-28 (×5): 200 mg via ORAL
  Filled 2017-11-27 (×6): qty 2

## 2017-11-27 MED ORDER — ADULT MULTIVITAMIN W/MINERALS CH
1.0000 | ORAL_TABLET | Freq: Every day | ORAL | Status: DC
  Administered 2017-11-27 – 2017-11-28 (×2): 1 via ORAL
  Filled 2017-11-27 (×2): qty 1

## 2017-11-27 MED ORDER — LORAZEPAM 0.5 MG PO TABS
0.0000 mg | ORAL_TABLET | Freq: Four times a day (QID) | ORAL | Status: DC
  Administered 2017-11-27: 2 mg via ORAL
  Administered 2017-11-28: 1 mg via ORAL
  Filled 2017-11-27: qty 2
  Filled 2017-11-27: qty 4

## 2017-11-27 MED ORDER — LABETALOL HCL 5 MG/ML IV SOLN
20.0000 mg | INTRAVENOUS | Status: DC | PRN
  Filled 2017-11-27: qty 4

## 2017-11-27 MED ORDER — HYDRALAZINE HCL 25 MG PO TABS
25.0000 mg | ORAL_TABLET | Freq: Three times a day (TID) | ORAL | Status: DC
  Administered 2017-11-27 (×3): 25 mg via ORAL
  Filled 2017-11-27 (×4): qty 1

## 2017-11-27 NOTE — H&P (Signed)
History and Physical    [name] JZP:[id number] DOB: May 21, 1961 DOA: (Not on file)  PCP: [facility], [facility]  Patient coming from: [facility]  I have personally briefly reviewed patient's old medical records in [place]  Chief Complaint: HTN  HPI: [name] is a 57 y.o. male with medical history significant of schizophrenia, EtOH abuse, cocaine abuse, HTN, DM2.  Patient had recently been admitted to [facility] on 1/6.  Put on CIWA for EtOH withdrawal.  While there he began to have difficulty with BP control.  Looks like Dr. Doyle Askew added scheduled TID hydralazine to try and control on 1/8.  Today he was refusing PO intake and BPs noted to be extremely high (480+ systolic / 165V+ diastolic).  Patient transferred to [facility] ED.  Patient has been having ongoing halucinations since arrival at [facility], these arnt really any worse today though.   ED Course: No tremor nor tachycardia.  Doesn't really seem to have severe EtOH withdrawal signs or symptoms.  Does have severe hypertension though.  This was treated with 20mg  IV labetalol with good response.  BP down to 170/110 by the time I am seeing patient.   Review of Systems: As per HPI otherwise 10 point review of systems negative.   Past Medical History:  Diagnosis Date  . Alcohol abuse   . Arthritis   . Crack cocaine use   . Depression   . Diabetes mellitus   . Drug abuse ([place])   . DTs (delirium tremens) ([place])    history of   . Gout   . Noncompliance with medication regimen   . Schizophrenia ([place])   . Stroke ([place])   . Uncontrolled hypertension     Past Surgical History:  Procedure Laterality Date  . MANDIBLE RECONSTRUCTION    . TONSILLECTOMY       reports that he has been smoking cigarettes.  He has been smoking about 0.25 packs per day. he has never used smokeless tobacco. He reports that he drinks about 0.6 - 1.2 oz of alcohol per week. He reports that he uses drugs. Drugs: "Crack" cocaine and  Cocaine.  No Known Allergies  Family History  Problem Relation Age of Onset  . Hypertension Unknown      Prior to Admission medications   Medication Sig Start Date End Date Taking? Authorizing Provider  amLODipine (NORVASC) 10 MG tablet Take 1 tablet (10 mg total) by mouth daily. Patient not taking: Reported on 11/22/2017 03/06/17   Smiley Houseman, MD  cloNIDine (CATAPRES - DOSED IN MG/24 HR) 0.3 mg/24hr patch Place 1 patch (0.3 mg total) onto the skin once a week. Patient not taking: Reported on 11/22/2017 03/12/17   Smiley Houseman, MD  DULoxetine (CYMBALTA) 30 MG capsule Take 1 capsule (30 mg total) by mouth 2 (two) times daily. Patient not taking: Reported on 11/22/2017 01/09/17   Patrecia Pour, NP  hydrochlorothiazide (HYDRODIURIL) 25 MG tablet Take 1 tablet (25 mg total) by mouth daily. Patient not taking: Reported on 11/22/2017 03/06/17   Smiley Houseman, MD  hydrOXYzine (ATARAX/VISTARIL) 25 MG tablet Take 1 tablet (25 mg total) by mouth 3 (three) times daily as needed for anxiety. Patient not taking: Reported on 11/22/2017 01/09/17   Patrecia Pour, NP  meloxicam (MOBIC) 7.5 MG tablet Take 7.5 mg by mouth 2 (two) times daily as needed for pain.    [provider]  metFORMIN (GLUCOPHAGE) 500 MG tablet Take 1 tablet (500 mg total) by mouth  2 (two) times daily with a meal. Patient not taking: Reported on 11/22/2017 01/09/17   Patrecia Pour, NP  paliperidone (INVEGA SUSTENNA) 156 MG/ML SUSP injection Inject 156 mg into the muscle every 30 (thirty) days.    [provider]  tiZANidine (ZANAFLEX) 2 MG tablet Take 2-4 mg by mouth every 8 (eight) hours as needed for muscle spasms.    [provider]    Physical Exam: There were no vitals filed for this visit.  Constitutional: NAD, calm, comfortable Eyes: PERRL, lids and conjunctivae normal ENMT: Mucous membranes are moist. Posterior pharynx clear of any exudate or lesions.Normal dentition.  Neck:  normal, supple, no masses, no thyromegaly Respiratory: clear to auscultation bilaterally, no wheezing, no crackles. Normal respiratory effort. No accessory muscle use.  Cardiovascular: Regular rate and rhythm, no murmurs / rubs / gallops. No extremity edema. 2+ pedal pulses. No carotid bruits.  Abdomen: no tenderness, no masses palpated. No hepatosplenomegaly. Bowel sounds positive.  Musculoskeletal: no clubbing / cyanosis. No joint deformity upper and lower extremities. Good ROM, no contractures. Normal muscle tone.  Skin: no rashes, lesions, ulcers. No induration Neurologic: CN 2-12 grossly intact. Sensation intact, DTR normal. Strength 5/5 in all 4.  Psychiatric: Some hallucinations, confusion.   Labs on Admission: I have personally reviewed following labs and imaging studies  CBC: Recent Labs  Lab 11/22/17 0400 11/26/17 0653  WBC 7.2 8.0  HGB 14.6 14.3  HCT 44.3 42.5  MCV 88.8 89.9  PLT 124* 237*   Basic Metabolic Panel: Recent Labs  Lab 11/22/17 0400 11/26/17 0653  NA 134* 135  K 3.1* 3.0*  CL 98* 96*  CO2 22 28  GLUCOSE 295* 238*  BUN 12 16  CREATININE 1.07 0.94  CALCIUM 9.3 9.1  MG  --  1.3*   GFR: Estimated Creatinine Clearance: 91.4 mL/min (by C-G formula based on SCr of 0.94 mg/dL). Liver Function Tests: Recent Labs  Lab 11/22/17 0400  AST 200*  ALT 108*  ALKPHOS 86  BILITOT 0.8  PROT 7.8  ALBUMIN 3.6   No results for input(s): LIPASE, AMYLASE in the last 168 hours. No results for input(s): AMMONIA in the last 168 hours. Coagulation Profile: No results for input(s): INR, PROTIME in the last 168 hours. Cardiac Enzymes: No results for input(s): CKTOTAL, CKMB, CKMBINDEX, TROPONINI in the last 168 hours. BNP (last 3 results) No results for input(s): PROBNP in the last 8760 hours. HbA1C: Recent Labs    11/24/17 0645  HGBA1C 8.6*   CBG: Recent Labs  Lab 11/26/17 0621 11/26/17 1116 11/26/17 1540 11/26/17 1747 11/26/17 2349  GLUCAP 185* 243*  195* 169* 196*   Lipid Profile: Recent Labs    11/24/17 0645  CHOL 164  HDL 52  LDLCALC 81  TRIG 156*  CHOLHDL 3.2   Thyroid Function Tests: Recent Labs    11/24/17 0645  TSH 1.375   Anemia Panel: No results for input(s): VITAMINB12, FOLATE, FERRITIN, TIBC, IRON, RETICCTPCT in the last 72 hours. Urine analysis:    Component Value Date/Time   COLORURINE YELLOW 03/03/2017 [place] 03/03/2017 1206   LABSPEC 1.027 03/03/2017 1206   PHURINE 5.0 03/03/2017 1206   GLUCOSEU >=500 (A) 03/03/2017 1206   HGBUR NEGATIVE 03/03/2017 1206   BILIRUBINUR NEGATIVE 03/03/2017 1206   KETONESUR NEGATIVE 03/03/2017 1206   PROTEINUR 30 (A) 03/03/2017 1206   UROBILINOGEN 0.2 08/27/2015 0210   NITRITE NEGATIVE 03/03/2017 [place] 03/03/2017 [place]  on Admission: Dg Chest 2 View  Result Date: 11/26/2017 CLINICAL DATA:  Dehydration.  Cough. EXAM: CHEST  2 VIEW COMPARISON:  11/22/2017 FINDINGS: Cardiomediastinal silhouette is normal. Mediastinal contours appear intact. Tortuosity and calcific atherosclerotic disease of the aorta. There is no evidence of focal airspace consolidation, pleural effusion or pneumothorax. Osseous structures are without acute abnormality. Soft tissues are grossly normal. IMPRESSION: No active cardiopulmonary disease. Tortuosity and calcific atherosclerotic disease of the aorta. Electronically Signed   By: Fidela Salisbury M.D.   On: 11/26/2017 18:21    EKG: Independently reviewed.  Assessment/Plan Active Problems:   Hypertensive urgency    1. HTN urgency - 1. BP improved to 130 / 80 at this point 2. Getting observed overnight 3. If stable likely okay to transfer back to [facility] 4. No focal neurologic deficits to indicate need for CT head at this time 2. EtOH abuse - 1. Continue CIWA 3. Schizoaffective disorder - 1. Actually getting some sleep finally now (apparently first in 5 days according to [facility]  notes).  DVT prophylaxis: Lovenox Code Status: Full Family Communication: None Disposition Plan: [facility] after admit Consults called: None Admission status: Place in obs   [place] [place] Hospital[facility] Pager [phone]  If 7AM-7PM, please contact day team taking care of patient [email or web address] Password [email or web address]  11/27/2017, 3:59 AM

## 2017-11-27 NOTE — ED Notes (Signed)
Bed: WA12 Expected date:  Expected time:  Means of arrival:  Comments: TR 2 

## 2017-11-27 NOTE — Progress Notes (Signed)
Patient seen and examined. Admitted after midnight secondary to hypertensive urgency. Patient is AAOX3 and cooperative with staff. No acute complaints. Per record reviewed he was supposed to be on clonidine and has not taken it since couple days prior to admission to [facility]; Most likely rebound HTN. Please refer to H&P written by Dr. Alcario Drought for further info/details on admission.   Plan: -continue hydralazine and norvasc -will resume his clonidine  -follow heart healthy diet  -maintain adequate hydration and oral intake -for his alcohol withdrawal, continue CIWA protocol; cessation counseling provided.  Barton Dubois MD [phone]

## 2017-11-27 NOTE — ED Notes (Signed)
Attempted to give report. Nurse will call me back.

## 2017-11-27 NOTE — ED Notes (Signed)
Bed: WA01 Expected date:  Expected time:  Means of arrival:  Comments: Pt in room--- being processed as direct admit

## 2017-11-27 NOTE — Progress Notes (Signed)
During bedside report, pt states that he feels suicidal. When asked how he feels tonight, pt states, "that I want to die" and when asked if he had a plan, pt states that "I would walk out in front of a truck". Pt explained that we are concerned for his safety and that the NP on call would be notified at this time that he is feeling suicidal. AC to be notified as well. Will continue to monitor.

## 2017-11-27 NOTE — ED Notes (Signed)
Pt is a [place] patient being admitted.

## 2017-11-27 NOTE — ED Notes (Signed)
Attempted to give report no answer. Phone kept ringing.

## 2017-11-27 NOTE — ED Notes (Signed)
Bed: BE67 Expected date:  Expected time:  Means of arrival:  Comments: Hold for rm 1

## 2017-11-27 NOTE — Progress Notes (Signed)
Suicide precautions initiated. Enviromental search of room performed and belongings placed at the nurse's station at this time. AC aware of need for suicide sitter. Door and curtain open at this time and pt is in visibility to nurse's station. Will continue to monitor.

## 2017-11-28 DIAGNOSIS — E1140 Type 2 diabetes mellitus with diabetic neuropathy, unspecified: Secondary | ICD-10-CM

## 2017-11-28 DIAGNOSIS — F1020 Alcohol dependence, uncomplicated: Secondary | ICD-10-CM | POA: Diagnosis not present

## 2017-11-28 DIAGNOSIS — E876 Hypokalemia: Secondary | ICD-10-CM | POA: Diagnosis not present

## 2017-11-28 DIAGNOSIS — F332 Major depressive disorder, recurrent severe without psychotic features: Secondary | ICD-10-CM

## 2017-11-28 DIAGNOSIS — F17210 Nicotine dependence, cigarettes, uncomplicated: Secondary | ICD-10-CM

## 2017-11-28 DIAGNOSIS — I160 Hypertensive urgency: Secondary | ICD-10-CM | POA: Diagnosis not present

## 2017-11-28 DIAGNOSIS — R45851 Suicidal ideations: Secondary | ICD-10-CM

## 2017-11-28 DIAGNOSIS — F1490 Cocaine use, unspecified, uncomplicated: Secondary | ICD-10-CM

## 2017-11-28 DIAGNOSIS — F250 Schizoaffective disorder, bipolar type: Secondary | ICD-10-CM

## 2017-11-28 DIAGNOSIS — F1099 Alcohol use, unspecified with unspecified alcohol-induced disorder: Secondary | ICD-10-CM

## 2017-11-28 LAB — BASIC METABOLIC PANEL
ANION GAP: 9 (ref 5–15)
BUN: 16 mg/dL (ref 6–20)
CO2: 26 mmol/L (ref 22–32)
Calcium: 8.5 mg/dL — ABNORMAL LOW (ref 8.9–10.3)
Chloride: 103 mmol/L (ref 101–111)
Creatinine, Ser: 0.85 mg/dL (ref 0.61–1.24)
GFR calc Af Amer: >60 mL/min (ref >60)
GFR calc non Af Amer: >60 mL/min (ref >60)
GLUCOSE: 192 mg/dL — AB (ref 65–99)
POTASSIUM: 3.2 mmol/L — AB (ref 3.5–5.1)
Sodium: 138 mmol/L (ref 135–145)

## 2017-11-28 LAB — GLUCOSE, CAPILLARY
GLUCOSE-CAPILLARY: 192 mg/dL — AB (ref 65–99)
Glucose-Capillary: 194 mg/dL — ABNORMAL HIGH (ref 65–99)
Glucose-Capillary: 118 mg/dL — ABNORMAL HIGH (ref 65–99)

## 2017-11-28 LAB — VITAMIN B1: VITAMIN B1 (THIAMINE): 141.9 nmol/L (ref 66.5–200.0)

## 2017-11-28 MED ORDER — HYDRALAZINE HCL 50 MG PO TABS
50.0000 mg | ORAL_TABLET | Freq: Three times a day (TID) | ORAL | Status: DC
  Administered 2017-11-28 (×2): 50 mg via ORAL
  Filled 2017-11-28 (×2): qty 1

## 2017-11-28 MED ORDER — HYDRALAZINE HCL 50 MG PO TABS: 50.0000 mg | ORAL_TABLET | Freq: Three times a day (TID) | ORAL | 1 refills | Status: DC

## 2017-11-28 MED ORDER — GABAPENTIN 100 MG PO CAPS: 200.0000 mg | ORAL_CAPSULE | Freq: Three times a day (TID) | ORAL | 1 refills | Status: DC

## 2017-11-28 MED ORDER — POTASSIUM CHLORIDE CRYS ER 20 MEQ PO TBCR
20.0000 meq | EXTENDED_RELEASE_TABLET | Freq: Every day | ORAL | Status: DC
  Administered 2017-11-28: 20 meq via ORAL
  Filled 2017-11-28: qty 1

## 2017-11-28 MED ORDER — THIAMINE HCL 100 MG PO TABS: 100.0000 mg | ORAL_TABLET | Freq: Every day | ORAL | 1 refills | Status: DC

## 2017-11-28 MED ORDER — CLONIDINE HCL 0.3 MG/24HR TD PTWK: 0.3000 mg | MEDICATED_PATCH | TRANSDERMAL | 1 refills | Status: DC

## 2017-11-28 MED ORDER — MAGNESIUM OXIDE 400 (241.3 MG) MG PO TABS
400.0000 mg | ORAL_TABLET | Freq: Every day | ORAL | Status: DC
  Administered 2017-11-28: 400 mg via ORAL
  Filled 2017-11-28: qty 1

## 2017-11-28 MED ORDER — DULOXETINE HCL 30 MG PO CPEP: 30.0000 mg | ORAL_CAPSULE | Freq: Two times a day (BID) | ORAL | 0 refills | Status: DC

## 2017-11-28 MED ORDER — MAGNESIUM OXIDE 400 (241.3 MG) MG PO TABS: 400.0000 mg | ORAL_TABLET | Freq: Every day | ORAL | 0 refills | Status: DC

## 2017-11-28 MED ORDER — FOLIC ACID 1 MG PO TABS: 1.0000 mg | ORAL_TABLET | Freq: Every day | ORAL | 1 refills | Status: DC

## 2017-11-28 MED ORDER — AMLODIPINE BESYLATE 10 MG PO TABS: 10.0000 mg | ORAL_TABLET | Freq: Every day | ORAL | 1 refills | Status: DC

## 2017-11-28 MED ORDER — METFORMIN HCL 500 MG PO TABS: 500.0000 mg | ORAL_TABLET | Freq: Two times a day (BID) | ORAL | 1 refills | Status: DC

## 2017-11-28 NOTE — Progress Notes (Signed)
CSW consulted to assist with homeless issues- pt also had psychiatry consult. Discussed with psychiatry- pt is not recommended to return to [facility] or inpatient treatment in general has he is denying SI and explains that his SI was situational (problems with roommate). Denying any AVH and no signs of psychosis. Pt explains he is not homeless but is living in a halfway house "on [address] behind the [employer]." Has no name or contact information for the home. States he can take bus back to the street and "will recognize the house." States if the roommate is still an issue for him there he will go to a shelter tonight.  CSW spoke with [name] CSW as well who advised pt has follow up appt with [facility] for residential treatment program screening Tuesday 12/02/17 7:45am. CSW reminded pt of this appt, included in AVS, and provide bus pass for assistance to this appt. Pt denies further needs.   Sharren Bridge, MSW, LCSW Clinical Social Work 11/28/2017 [phone]

## 2017-11-28 NOTE — Progress Notes (Signed)
[facility] PROGRESS NOTE  [name] BDZ:[id number] DOB: 10-Jul-1961 DOA: 11/27/2017 PCP: [facility]  Interim summary and HPI 57 y.o. male with medical history significant of schizophrenia, EtOH abuse, cocaine abuse, HTN, DM2.  Patient had recently been admitted to [facility] on 1/6.  Put on CIWA for EtOH withdrawal.  While there he began to have difficulty with BP control. Patient transfer for assistance with BP control and medical stabilization.  Assessment/Plan: 1-hypertensive urgency: in the setting of medication non-compliance and most likely rebound HTN after abruptly stopping clonidine. -BP is now stable and better controlled -will continue norvasc and hydralazine (last one adjusted -clonidine patch resumed  -heart healthy diet encouraged  -patient educated to quit drinking alcohol   2-alcohol abuse -continue CIWA protocol -continue thiamine and folic acid   3-diabetes with neuropathy  -continue SSI, metformin and lantus -continue neurontin  -A1C 8.6  4-hypokalemia -will use daily supplementation -most likely associated with alcohol misuse  -will also continue daily magnesium   5-suicidal ideation, schizoaffective disorder and depression -further management per psychiatry -continue bedside sitter  -in my opinion will benefit of inpatient psych treatment   6-cocaine abuse -cessation counseling provided   Code Status: Full Family Communication: no family at bedside  Disposition Plan: patient is medically stable to be discharge back to [facility] for further stabilization of his mood disorder and treatment of depression/suicidal thoughts.  Procedures:  See below for x-ray reports   Antibiotics:  None   HPI/Subjective: Afebrile, no CP, no SOB. Complaining of hallucinations and Suicidal thoughts. BP is better controlled.  Objective: Vitals:   11/27/17 2223 11/28/17 0602  BP: (!) 156/97 (!) 147/106  Pulse: 87 83  Resp: 18 (!) 24   Temp: 98.6 F (37 C) 98.4 F (36.9 C)  SpO2: 95% 100%    Intake/Output Summary (Last 24 hours) at 11/28/2017 1004 Last data filed at 11/28/2017 0600 Gross per 24 hour  Intake 564 ml  Output 575 ml  Net -11 ml   Filed Weights   11/27/17 1850  Weight: 90.3 kg (199 lb)    Exam:   General: afebrile, denies CP, SOB and HA's. AAOX3, complaining of hearing voices, some intermittent vivid hallucinations and suicidal thoughts.  Cardiovascular: S1 and S2, no rubs, no gallops  Respiratory: CTA bilaterally   Abdomen: soft, NT, ND, positive BS  Musculoskeletal: no edema, no cyanosis   Data Reviewed: Basic Metabolic Panel: Recent Labs  Lab 11/22/17 0400 11/26/17 0653 11/28/17 0735  NA 134* 135 138  K 3.1* 3.0* 3.2*  CL 98* 96* 103  CO2 22 28 26   GLUCOSE 295* 238* 192*  BUN 12 16 16   CREATININE 1.07 0.94 0.85  CALCIUM 9.3 9.1 8.5*  MG  --  1.3*  --    Liver Function Tests: Recent Labs  Lab 11/22/17 0400  AST 200*  ALT 108*  ALKPHOS 86  BILITOT 0.8  PROT 7.8  ALBUMIN 3.6   CBC: Recent Labs  Lab 11/22/17 0400 11/26/17 0653  WBC 7.2 8.0  HGB 14.6 14.3  HCT 44.3 42.5  MCV 88.8 89.9  PLT 124* 121*   CBG: Recent Labs  Lab 11/27/17 0816 11/27/17 1222 11/27/17 1819 11/27/17 2030 11/28/17 0719  GLUCAP 153* 153* 130* 178* 194*   Studies: Dg Chest 2 View  Result Date: 11/26/2017 CLINICAL DATA:  Dehydration.  Cough. EXAM: CHEST  2 VIEW COMPARISON:  11/22/2017 FINDINGS: Cardiomediastinal silhouette is normal. Mediastinal contours appear intact. Tortuosity and calcific atherosclerotic disease of the aorta.  There is no evidence of focal airspace consolidation, pleural effusion or pneumothorax. Osseous structures are without acute abnormality. Soft tissues are grossly normal. IMPRESSION: No active cardiopulmonary disease. Tortuosity and calcific atherosclerotic disease of the aorta. Electronically Signed   By: Fidela Salisbury M.D.   On: 11/26/2017 18:21     Scheduled Meds: . amLODipine  10 mg Oral Daily  . cloNIDine  0.3 mg Transdermal Weekly  . enoxaparin (LOVENOX) injection  40 mg Subcutaneous Q24H  . folic acid  1 mg Oral Daily  . gabapentin  200 mg Oral TID  . hydrALAZINE  50 mg Oral TID  . insulin aspart  0-15 Units Subcutaneous TID WC  . insulin glargine  10 Units Subcutaneous QHS  . LORazepam  0-4 mg Oral Q6H   Followed by  . [START ON 11/29/2017] LORazepam  0-4 mg Oral Q12H  . metFORMIN  500 mg Oral BID WC  . multivitamin with minerals  1 tablet Oral Daily  . thiamine  100 mg Oral Daily   Or  . thiamine  100 mg Intravenous Daily   Continuous Infusions: . ondansetron (ZOFRAN) IV      Principal Problem:   Hypertensive urgency Active Problems:   Alcohol use disorder, severe, dependence ([place])   Schizoaffective disorder, bipolar type ([place])    Time spent: 30 minutes    [facility] Pager [phone]. If 7PM-7AM, please contact night-coverage at [email or web address], password [facility] 11/28/2017, 10:04 AM  LOS: 0 days

## 2017-11-28 NOTE — Discharge Summary (Signed)
Physician Discharge Summary  Kristen Bushway PNT:[id number] DOB: 09-19-1961 DOA: 11/27/2017  PCP: [facility]  Admit date: 11/27/2017 Discharge date: 11/28/2017  Time spent: 35 minutes  Recommendations for Outpatient Follow-up:  1. Repeat BMET and Mg level to follow renal function and electrolytes  2. Reassess BP and adjust antihypertensive regimen as needed    Discharge Diagnoses:  Principal Problem:   Hypertensive urgency Active Problems:   Alcohol use disorder, severe, dependence (H[facility])   Schizoaffective disorder, bipolar type ([place])   Hypokalemia Cocaine use Hypomagnesemia   Discharge Condition: stable and improved. Discharge home with instructions to follow up with PCP and to follow at [facility] for outpatient psychiatry services.   Diet recommendation: low sodium and modified carb diet   Filed Weights   11/27/17 1850  Weight: 90.3 kg (199 lb)    History of present illness:  57 y.o.malewith medical history significant ofschizophrenia, EtOH abuse, cocaine abuse, HTN, DM2. Patient had recently been admitted to [facility] on 1/6. Put on CIWA for EtOH withdrawal. While there he began to have difficulty with BP control. Patient was transferred to the hospital for assistance with BP control and medical stabilization.  Hospital Course:  1-hypertensive urgency: in the setting of medication non-compliance and most likely rebound HTN after abruptly stopping clonidine. -BP is now stable and better controlled -will continue norvasc and hydralazine (last one adjusted to 50mg  TID) -clonidine patch resumed  -heart healthy diet encouraged  -patient educated to quit drinking alcohol and using recreational drugs -also instructed to be compliant with medication regimen   2-alcohol abuse -no active withdrawal appreciated  -will continue thiamine and folic acid  -cessation counseling provided -will go to [facility] for treatment as an outpatient   3-diabetes with  neuropathy  -continue neurontin  -A1C 8.6 -low carb diet encouraged -metformin to be resumed at discharge  4-hypokalemia/hypomagnesemia  -will use daily supplementation -most likely associated with alcohol misuse   5-suicidal ideation, schizoaffective disorder and depression -clear for discharge by psychiatry service -will go to [facility] for outpatient treatment -patient contracted for safety -continue cymbalta  6-cocaine abuse -cessation counseling provided     Procedures:  See below for x-ray reports   Consultations:  Psychiatry   Discharge Exam: Vitals:   11/28/17 0602 11/28/17 1113  BP: (!) 147/106 (!) 169/99  Pulse: 83 84  Resp: (!) 24 (!) 22  Temp: 98.4 F (36.9 C) 98.6 F (37 C)  SpO2: 100% 97%     General: afebrile, denies CP, SOB and HA's. AAOX3, he was complaining of hearing voices and some intermittent vivid hallucinations/suicidal thoughts to me; but then denied to psychiatry service and even expressed using these symptoms for second gain (see psych note).   Cardiovascular: S1 and S2, no rubs, no gallops  Respiratory: CTA bilaterally   Abdomen: soft, NT, ND, positive BS  Musculoskeletal: no edema, no cyanosis      Discharge Instructions   Discharge Instructions    Diet - low sodium heart healthy   Complete by:  As directed    Discharge instructions   Complete by:  As directed    Stop alcohol and recreational drugs use Take medications as prescribed  Keep yourself well hydrated  Follow low sodium diet     Allergies as of 11/28/2017   No Known Allergies     Medication List    STOP taking these medications   hydrochlorothiazide 25 MG tablet Commonly known as:  HYDRODIURIL   hydrOXYzine 25 MG tablet Commonly known as:  ATARAX/VISTARIL   meloxicam 7.5 MG tablet Commonly known as:  MOBIC   tiZANidine 2 MG tablet Commonly known as:  ZANAFLEX     TAKE these medications   amLODipine 10 MG tablet Commonly known as:   NORVASC Take 1 tablet (10 mg total) by mouth daily.   cloNIDine 0.3 mg/24hr patch Commonly known as:  CATAPRES - Dosed in mg/24 hr Place 1 patch (0.3 mg total) onto the skin once a week.   DULoxetine 30 MG capsule Commonly known as:  CYMBALTA Take 1 capsule (30 mg total) by mouth 2 (two) times daily.   folic acid 1 MG tablet Commonly known as:  FOLVITE Take 1 tablet (1 mg total) by mouth daily. Start taking on:  11/29/2017   gabapentin 100 MG capsule Commonly known as:  NEURONTIN Take 2 capsules (200 mg total) by mouth 3 (three) times daily.   hydrALAZINE 50 MG tablet Commonly known as:  APRESOLINE Take 1 tablet (50 mg total) by mouth 3 (three) times daily.   INVEGA SUSTENNA 156 MG/ML Susp injection Generic drug:  paliperidone Inject 156 mg into the muscle every 30 (thirty) days.   magnesium oxide 400 (241.3 Mg) MG tablet Commonly known as:  MAG-OX Take 1 tablet (400 mg total) by mouth daily. Start taking on:  11/29/2017   metFORMIN 500 MG tablet Commonly known as:  GLUCOPHAGE Take 1 tablet (500 mg total) by mouth 2 (two) times daily with a meal.   thiamine 100 MG tablet Take 1 tablet (100 mg total) by mouth daily. Start taking on:  11/29/2017      No Known Allergies Follow-up Information    [facility]. Schedule an appointment as soon as possible for a visit in 1 week(s).   Specialty:  Family Medicine Contact information: [address] [place] [address] [phone]           The results of significant diagnostics from this hospitalization (including imaging, microbiology, ancillary and laboratory) are listed below for reference.    Significant Diagnostic Studies: Dg Chest 2 View  Result Date: 11/26/2017 CLINICAL DATA:  Dehydration.  Cough. EXAM: CHEST  2 VIEW COMPARISON:  11/22/2017 FINDINGS: Cardiomediastinal silhouette is normal. Mediastinal contours appear intact. Tortuosity and calcific atherosclerotic  disease of the aorta. There is no evidence of focal airspace consolidation, pleural effusion or pneumothorax. Osseous structures are without acute abnormality. Soft tissues are grossly normal. IMPRESSION: No active cardiopulmonary disease. Tortuosity and calcific atherosclerotic disease of the aorta. Electronically Signed   By: Fidela Salisbury M.D.   On: 11/26/2017 18:21   Dg Chest 2 View  Result Date: 11/22/2017 CLINICAL DATA:  Chest pain for 2 weeks. EXAM: CHEST  2 VIEW COMPARISON:  02/10/2017 FINDINGS: Normal mediastinal contours with upper normal heart size. The lungs are clear. Pulmonary vasculature is normal. No consolidation, pleural effusion, or pneumothorax. No acute osseous abnormalities are seen. Chronic right AC joint separation. IMPRESSION: Borderline cardiomegaly without acute abnormality. Electronically Signed   By: Jeb Levering M.D.   On: 11/22/2017 04:11   Labs: Basic Metabolic Panel: Recent Labs  Lab 11/22/17 0400 11/26/17 0653 11/28/17 0735  NA 134* 135 138  K 3.1* 3.0* 3.2*  CL 98* 96* 103  CO2 22 28 26   GLUCOSE 295* 238* 192*  BUN 12 16 16   CREATININE 1.07 0.94 0.85  CALCIUM 9.3 9.1 8.5*  MG  --  1.3*  --    Liver Function Tests: Recent Labs  Lab  11/22/17 0400  AST 200*  ALT 108*  ALKPHOS 86  BILITOT 0.8  PROT 7.8  ALBUMIN 3.6   CBC: Recent Labs  Lab 11/22/17 0400 11/26/17 0653  WBC 7.2 8.0  HGB 14.6 14.3  HCT 44.3 42.5  MCV 88.8 89.9  PLT 124* 121*    CBG: Recent Labs  Lab 11/27/17 1222 11/27/17 1819 11/27/17 2030 11/28/17 0719 11/28/17 1132  GLUCAP 153* 130* 178* 194* 192*    Signed:  Barton Dubois MD.  [facility] 11/28/2017, 4:17 PM

## 2017-11-28 NOTE — Consult Note (Signed)
[facility] Consult   Reason for Consult:  SI with plan Referring Physician:  Dr. Dyann Kief Patient Identification: [name] MRN:  [id number] Principal Diagnosis: Hypertensive urgency Diagnosis:   Patient Active Problem List   Diagnosis Date Noted  . Schizoaffective disorder, bipolar type ([place]) [F25.0] 11/22/2017  . Trauma [T14.90XA]   . Tachycardia [R00.0] 03/03/2017  . Alcohol abuse [F10.10]   . Cocaine abuse ([place]) [F14.10]   . Hypertensive urgency [I16.0]   . Tachycardia [R00.0]   . Cocaine abuse with cocaine-induced mood disorder ([place]) [F14.14] 12/05/2016  . Cerebrovascular disease [I67.9] 09/02/2015  . 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 Malignant hypertension [I10] 08/30/2015  . Alcohol use disorder, severe, dependence ([place]) [F10.20] 08/30/2015  . Alcohol withdrawal ([place]) [F10.239] 08/30/2015  . 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 Thrombocytopenia ([place]) [D69.6] 08/27/2015  . Tobacco use disorder [F17.200] 08/26/2015  . Hyperlipidemia [E78.5] 04/28/2015  . Schizoaffective disorder, depressive type ([place]) [F25.1]   . Gout [M10.9] 08/09/2013  . Diabetes mellitus ([place]) [E11.9] 07/09/2012  . Polysubstance abuse ([place]) [F19.10] 07/09/2012    Total Time spent with patient: 20 minutes  Subjective:   [name] is a 57 y.o. male reports that he is still feeling depressed and he can be suicidal if he needs to be. When asked what we can do to help, patient states "Get me back to the program [facility] [place] before I'm kicked out." He reports that he stays at a kind of half-way house and the guy staying with him is bringing women and drugs in and if he gets caught they will both be kicked out. He came to the hospital to keep from getting kicked out. He reports that he still does not stay compliant  with any treatment or medications. He agrees to to discharge if assisted with getting back to the half way house and would like to be sent to [facility] for outpatient services. He denies any SI/HI/AVH and contracts for safety.  HPI:  57 y.o.malewith medical history significant ofschizophrenia, EtOH abuse, cocaine abuse, HTN, DM2. Patient had recently been admitted to [facility] on 1/6. Put on CIWA for EtOH withdrawal. While there he began to have difficulty with BP control. Patient transfer for assistance with BP control and medical stabilization  Past Psychiatric History: Schizophrenia, Bipolar disorder  Risk to Self: Is patient at risk for suicide?: Yes Risk to Others:   Prior Inpatient Therapy:   Prior Outpatient Therapy:    Past Medical History:  Past Medical History:  Diagnosis Date  . Alcohol abuse   . Arthritis   . Crack cocaine use   . Depression   . Diabetes mellitus   . Drug abuse ([place])   . DTs (delirium tremens) ([place])    history of   . Gout   . Noncompliance with medication regimen   . Schizophrenia ([place])   . Stroke ([place])   . Uncontrolled hypertension     Past Surgical History:  Procedure Laterality Date  . MANDIBLE RECONSTRUCTION    . TONSILLECTOMY     Family History:  Family History  Problem Relation Age of Onset  . Hypertension Unknown    Family Psychiatric  History: Denies Social History:  Social History   Substance and Sexual Activity  Alcohol Use Yes  . Alcohol/week: 0.6 - 1.2 oz  . Types: 1 - 2 Cans of beer per week   Comment: [facility]  Social History   Substance and Sexual Activity  Drug Use Yes  . Types: "Crack" cocaine, Cocaine    Social History   Socioeconomic History  . Marital status: Legally Separated    Spouse name: None  . Number of children: None  . Years of education: None  . Highest education level: None  Social Needs  . Financial resource strain: None  . Food insecurity - worry: None  . Food insecurity - inability:  None  . Transportation needs - medical: None  . Transportation needs - non-medical: None  Occupational History  . None  Tobacco Use  . Smoking status: Current Every Day Smoker    Packs/day: 0.25    Types: Cigarettes  . Smokeless tobacco: Never Used  Substance and Sexual Activity  . Alcohol use: Yes    Alcohol/week: 0.6 - 1.2 oz    Types: 1 - 2 Cans of beer per week    Comment: [facility]  . Drug use: Yes    Types: "Crack" cocaine, Cocaine  . Sexual activity: None  Other Topics Concern  . None  Social History Narrative  . None   Additional Social History:    Allergies:  No Known Allergies  Labs:  Results for orders placed or performed during the hospital encounter of 11/27/17 (from the past 48 hour(s))  CBG monitoring, ED     Status: Abnormal   Collection Time: 11/27/17  8:16 AM  Result Value Ref Range   Glucose-Capillary 153 (H) 65 - 99 mg/dL  CBG monitoring, ED     Status: Abnormal   Collection Time: 11/27/17 12:22 PM  Result Value Ref Range   Glucose-Capillary 153 (H) 65 - 99 mg/dL  Glucose, capillary     Status: Abnormal   Collection Time: 11/27/17  6:19 PM  Result Value Ref Range   Glucose-Capillary 130 (H) 65 - 99 mg/dL  Glucose, capillary     Status: Abnormal   Collection Time: 11/27/17  8:30 PM  Result Value Ref Range   Glucose-Capillary 178 (H) 65 - 99 mg/dL  Glucose, capillary     Status: Abnormal   Collection Time: 11/28/17  7:19 AM  Result Value Ref Range   Glucose-Capillary 194 (H) 65 - 99 mg/dL  Basic metabolic panel     Status: Abnormal   Collection Time: 11/28/17  7:35 AM  Result Value Ref Range   Sodium 138 135 - 145 mmol/L   Potassium 3.2 (L) 3.5 - 5.1 mmol/L   Chloride 103 101 - 111 mmol/L   CO2 26 22 - 32 mmol/L   Glucose, Bld 192 (H) 65 - 99 mg/dL   BUN 16 6 - 20 mg/dL   Creatinine, Ser 0.85 0.61 - 1.24 mg/dL   Calcium 8.5 (L) 8.9 - 10.3 mg/dL   GFR calc non Af Amer >60 >60 mL/min   GFR calc Af Amer >60 >60 mL/min    Comment:  (NOTE) The eGFR has been calculated using the CKD EPI equation. This calculation has not been validated in all clinical situations. eGFR's persistently <60 mL/min signify possible Chronic Kidney Disease.    Anion gap 9 5 - 15  Glucose, capillary     Status: Abnormal   Collection Time: 11/28/17 11:32 AM  Result Value Ref Range   Glucose-Capillary 192 (H) 65 - 99 mg/dL    Current Facility-Administered Medications  Medication Dose Route Frequency Provider Last Rate Last Dose  . acetaminophen (TYLENOL) tablet 650 mg  650 mg Oral Q6H PRN Jennette Kettle  M, DO   650 mg at 11/28/17 0801   Or  . acetaminophen (TYLENOL) suppository 650 mg  650 mg Rectal Q6H PRN Etta Quill, DO      . amLODipine (NORVASC) tablet 10 mg  10 mg Oral Daily Jennette Kettle M, DO   10 mg at 11/28/17 0931  . cloNIDine (CATAPRES - Dosed in mg/24 hr) patch 0.3 mg  0.3 mg Transdermal Weekly Barton Dubois, MD   0.3 mg at 11/27/17 1242  . enoxaparin (LOVENOX) injection 40 mg  40 mg Subcutaneous Q24H Jennette Kettle M, DO   40 mg at 11/28/17 0932  . folic acid (FOLVITE) tablet 1 mg  1 mg Oral Daily Jennette Kettle M, DO   1 mg at 11/28/17 0931  . gabapentin (NEURONTIN) capsule 200 mg  200 mg Oral TID Etta Quill, DO   200 mg at 11/28/17 0931  . hydrALAZINE (APRESOLINE) tablet 50 mg  50 mg Oral TID Barton Dubois, MD   50 mg at 11/28/17 0931  . insulin aspart (novoLOG) injection 0-15 Units  0-15 Units Subcutaneous TID WC Etta Quill, DO   3 Units at 11/28/17 [phone]  . insulin glargine (LANTUS) injection 10 Units  10 Units Subcutaneous QHS Jennette Kettle M, DO   10 Units at 11/27/17 2222  . labetalol (NORMODYNE,TRANDATE) injection 20 mg  20 mg Intravenous Q2H PRN Barton Dubois, MD      . LORazepam (ATIVAN) tablet 1 mg  1 mg Oral Q6H PRN Etta Quill, DO       Or  . LORazepam (ATIVAN) injection 1 mg  1 mg Intravenous Q6H PRN Etta Quill, DO      . LORazepam (ATIVAN) tablet 0-4 mg  0-4 mg Oral Q6H Jennette Kettle M, DO   1 mg at 11/28/17 0606   Followed by  . [START ON 11/29/2017] LORazepam (ATIVAN) tablet 0-4 mg  0-4 mg Oral Q12H Jennette Kettle M, DO      . magnesium oxide (MAG-OX) tablet 400 mg  400 mg Oral Daily Barton Dubois, MD      . metFORMIN (GLUCOPHAGE) tablet 500 mg  500 mg Oral BID WC Jennette Kettle M, DO   500 mg at 11/28/17 0800  . multivitamin with minerals tablet 1 tablet  1 tablet Oral Daily Jennette Kettle M, DO   1 tablet at 11/28/17 0931  . ondansetron (ZOFRAN) tablet 4 mg  4 mg Oral Q6H PRN Etta Quill, DO       Or  . ondansetron (ZOFRAN) 4 mg in sodium chloride 0.9 % 50 mL IVPB  4 mg Intravenous Q6H PRN Etta Quill, DO      . potassium chloride SA (K-DUR,KLOR-CON) CR tablet 20 mEq  20 mEq Oral Daily Barton Dubois, MD      . thiamine (VITAMIN B-1) tablet 100 mg  100 mg Oral Daily Alcario Drought, Jared M, DO   100 mg at 11/28/17 7867   Or  . thiamine (B-1) injection 100 mg  100 mg Intravenous Daily Etta Quill, DO        Musculoskeletal: Strength & Muscle Tone: decreased Gait & Station: unsteady Patient leans: N/A  Psychiatric Specialty Exam: Physical Exam  Nursing note and vitals reviewed. Constitutional: He is oriented to person, place, and time. He appears well-developed and well-nourished.  Cardiovascular: Normal rate.  Neurological: He is alert and oriented to person, place, and time.    Review of Systems  Psychiatric/Behavioral: Positive for substance abuse. Negative  for hallucinations and suicidal ideas. The patient is not nervous/anxious.     Blood pressure (!) 169/99, pulse 84, temperature 98.6 F (37 C), temperature source Oral, resp. rate (!) 22, height '5\' 6"'  (1.676 m), weight 90.3 kg (199 lb), SpO2 97 %.Body mass index is 32.12 kg/m.  General Appearance: Casual  Eye Contact:  Good  Speech:  Clear and Coherent and Normal Rate  Volume:  Normal  Mood:  Euthymic  Affect:  Appropriate  Thought Process:  Goal Directed and Descriptions of  Associations: Intact  Orientation:  Full (Time, Place, and Person)  Thought Content:  WDL  Suicidal Thoughts:  No  Homicidal Thoughts:  No  Memory:  Immediate;   Good Recent;   Good Remote;   Good  Judgement:  Good  Insight:  Good  Psychomotor Activity:  Normal  Concentration:  Concentration: Good and Attention Span: Good  Recall:  Good  Fund of Knowledge:  Good  Language:  Good  Akathisia:  No  Handed:  Right  AIMS (if indicated):     Assets:  Communication Skills Desire for Improvement Financial Resources/Insurance Social Support  ADL's:  Intact  Cognition:  WNL  Sleep:        Treatment Plan Summary: -CSW to assist with referral to [facility] outpatient -CSW to assist with returning patient to half-way house -Patient instructed to stop using substances and alcohol -Encouraged patient to be compliant with treatment  Disposition: No evidence of imminent risk to self or others at present.   Patient does not meet criteria for psychiatric inpatient admission. Supportive therapy provided about ongoing stressors. Discussed crisis plan, support from social network, calling 911, coming to the Emergency Department, and calling Suicide Hotline.  [place], FNP 11/28/2017 11:35 AM

## 2017-11-28 NOTE — Progress Notes (Signed)
IV removed and scripts given with discharge summary.  Has bus pass to go back to group home.

## 2017-11-28 NOTE — Discharge Instructions (Signed)
You have an intake appointment at [facility] 7:45am Tuesday 12/02/17 [address], [place], [place] [address] [phone]

## 2017-11-28 NOTE — Care Management Note (Signed)
Case Management Note  Patient Details  Name: Ladarion Munyon MRN: [id number] Date of Birth: 08-Apr-1961  Subjective/Objective: 57 y/o m admitted w/HTN urgency. Hx: schizoaffective d/o.Patient says he's from a sober halfway house-but unsure of name. Has gone to [facility]. 1:1. Await Psych cons. Psych CSW already following.                   Action/Plan:d/c IP Psych.   Expected Discharge Date:  (unknown)               Expected Discharge Plan:  Psychiatric Hospital  In-House Referral:  Clinical Social Work  Discharge planning Services  CM Consult  Post Acute Care Choice:    Choice offered to:     DME Arranged:    DME Agency:     HH Arranged:    [place] Agency:     Status of Service:  In process, will continue to follow  If discussed at Long Length of Stay Meetings, dates discussed:    Additional Comments:  Dessa Phi, RN 11/28/2017, 11:17 AM

## 2017-11-28 NOTE — Progress Notes (Signed)
Patient voicing that he continues to have visual and auditory hallucinations and states "yes" to pain and that if we "give him a gun he will end it all".  Sitter at bedside.  No attempts to harm self observed at this time.

## 2017-11-30 ENCOUNTER — Emergency Department (HOSPITAL_COMMUNITY)
Admission: EM | Admit: 2017-11-30 | Discharge: 2017-11-30 | Disposition: A | Discharge status: Home / Self Care | Payer: Medicaid Other | Attending: Emergency Medicine | Admitting: Emergency Medicine

## 2017-11-30 DIAGNOSIS — I10 Essential (primary) hypertension: Secondary | ICD-10-CM | POA: Diagnosis not present

## 2017-11-30 DIAGNOSIS — Z9119 Patient's noncompliance with other medical treatment and regimen: Secondary | ICD-10-CM | POA: Insufficient documentation

## 2017-11-30 DIAGNOSIS — F1014 Alcohol abuse with alcohol-induced mood disorder: Secondary | ICD-10-CM | POA: Diagnosis not present

## 2017-11-30 DIAGNOSIS — F17210 Nicotine dependence, cigarettes, uncomplicated: Secondary | ICD-10-CM | POA: Insufficient documentation

## 2017-11-30 DIAGNOSIS — R45851 Suicidal ideations: Secondary | ICD-10-CM | POA: Diagnosis not present

## 2017-11-30 DIAGNOSIS — F1994 Other psychoactive substance use, unspecified with psychoactive substance-induced mood disorder: Secondary | ICD-10-CM

## 2017-11-30 DIAGNOSIS — Y906 Blood alcohol level of 120-199 mg/100 ml: Secondary | ICD-10-CM | POA: Insufficient documentation

## 2017-11-30 DIAGNOSIS — R45850 Homicidal ideations: Secondary | ICD-10-CM | POA: Diagnosis present

## 2017-11-30 DIAGNOSIS — F1020 Alcohol dependence, uncomplicated: Secondary | ICD-10-CM | POA: Diagnosis present

## 2017-11-30 DIAGNOSIS — Z91199 Patient's noncompliance with other medical treatment and regimen due to unspecified reason: Secondary | ICD-10-CM

## 2017-11-30 DIAGNOSIS — F1910 Other psychoactive substance abuse, uncomplicated: Secondary | ICD-10-CM

## 2017-11-30 DIAGNOSIS — F1410 Cocaine abuse, uncomplicated: Secondary | ICD-10-CM | POA: Diagnosis present

## 2017-11-30 LAB — CBC
HEMATOCRIT: 44.4 % (ref 39.0–52.0)
HEMOGLOBIN: 14.9 g/dL (ref 13.0–17.0)
MCH: 30.1 pg (ref 26.0–34.0)
MCHC: 33.6 g/dL (ref 30.0–36.0)
MCV: 89.7 fL (ref 78.0–100.0)
Platelets: 163 10*3/uL (ref 150–400)
RBC: 4.95 MIL/uL (ref 4.22–5.81)
RDW: 12.7 % (ref 11.5–15.5)
WBC: 8.7 10*3/uL (ref 4.0–10.5)

## 2017-11-30 LAB — ETHANOL: Alcohol, Ethyl (B): 183 mg/dL — ABNORMAL HIGH (ref <10)

## 2017-11-30 LAB — URINALYSIS, ROUTINE W REFLEX MICROSCOPIC
Bilirubin Urine: NEGATIVE
Glucose, UA: 150 mg/dL — AB
Ketones, ur: NEGATIVE mg/dL
Leukocytes, UA: NEGATIVE
NITRITE: NEGATIVE
Protein, ur: 100 mg/dL — AB
SPECIFIC GRAVITY, URINE: 1.004 — AB (ref 1.005–1.030)
Squamous Epithelial / LPF: NONE SEEN
pH: 5 (ref 5.0–8.0)

## 2017-11-30 LAB — COMPREHENSIVE METABOLIC PANEL
ALT: 47 U/L (ref 17–63)
AST: 102 U/L — AB (ref 15–41)
Albumin: 4.3 g/dL (ref 3.5–5.0)
Alkaline Phosphatase: 81 U/L (ref 38–126)
Anion gap: 12 (ref 5–15)
BUN: 11 mg/dL (ref 6–20)
CHLORIDE: 102 mmol/L (ref 101–111)
CO2: 22 mmol/L (ref 22–32)
CREATININE: 0.78 mg/dL (ref 0.61–1.24)
Calcium: 9 mg/dL (ref 8.9–10.3)
GFR calc Af Amer: >60 mL/min (ref >60)
GFR calc non Af Amer: >60 mL/min (ref >60)
Glucose, Bld: 165 mg/dL — ABNORMAL HIGH (ref 65–99)
Potassium: 3.4 mmol/L — ABNORMAL LOW (ref 3.5–5.1)
SODIUM: 136 mmol/L (ref 135–145)
Total Bilirubin: 0.5 mg/dL (ref 0.3–1.2)
Total Protein: 8.4 g/dL — ABNORMAL HIGH (ref 6.5–8.1)

## 2017-11-30 LAB — RAPID URINE DRUG SCREEN, HOSP PERFORMED
AMPHETAMINES: NOT DETECTED
Barbiturates: NOT DETECTED
Benzodiazepines: NOT DETECTED
Cocaine: POSITIVE — AB
OPIATES: NOT DETECTED
TETRAHYDROCANNABINOL: NOT DETECTED

## 2017-11-30 LAB — ACETAMINOPHEN LEVEL: Acetaminophen (Tylenol), Serum: <10 ug/mL — ABNORMAL LOW (ref 10–30)

## 2017-11-30 LAB — SALICYLATE LEVEL: Salicylate Lvl: <7 mg/dL (ref 2.8–30.0)

## 2017-11-30 MED ORDER — GABAPENTIN 300 MG PO CAPS: 300.0000 mg | ORAL_CAPSULE | Freq: Three times a day (TID) | ORAL | Status: DC

## 2017-11-30 MED ORDER — ZIPRASIDONE MESYLATE 20 MG IM SOLR
10.0000 mg | Freq: Once | INTRAMUSCULAR | Status: AC
  Administered 2017-11-30: 10 mg via INTRAMUSCULAR
  Filled 2017-11-30: qty 20

## 2017-11-30 MED ORDER — GABAPENTIN 100 MG PO CAPS: 300.0000 mg | ORAL_CAPSULE | Freq: Three times a day (TID) | ORAL | 0 refills | Status: DC

## 2017-11-30 MED ORDER — HYDRALAZINE HCL 50 MG PO TABS: 50.0000 mg | ORAL_TABLET | Freq: Three times a day (TID) | ORAL | Status: DC

## 2017-11-30 MED ORDER — GABAPENTIN 100 MG PO CAPS
200.0000 mg | ORAL_CAPSULE | Freq: Three times a day (TID) | ORAL | Status: DC
  Administered 2017-11-30: 200 mg via ORAL
  Filled 2017-11-30: qty 2

## 2017-11-30 MED ORDER — VITAMIN B-1 100 MG PO TABS
100.0000 mg | ORAL_TABLET | Freq: Every day | ORAL | Status: DC
  Administered 2017-11-30: 100 mg via ORAL
  Filled 2017-11-30: qty 1

## 2017-11-30 MED ORDER — AMLODIPINE BESYLATE 5 MG PO TABS
10.0000 mg | ORAL_TABLET | Freq: Every day | ORAL | Status: DC
  Administered 2017-11-30: 10 mg via ORAL
  Filled 2017-11-30 (×2): qty 2

## 2017-11-30 MED ORDER — DULOXETINE HCL 30 MG PO CPEP
30.0000 mg | ORAL_CAPSULE | Freq: Two times a day (BID) | ORAL | Status: DC
  Administered 2017-11-30 (×2): 30 mg via ORAL
  Filled 2017-11-30 (×2): qty 1

## 2017-11-30 MED ORDER — HYDRALAZINE HCL 50 MG PO TABS
50.0000 mg | ORAL_TABLET | Freq: Three times a day (TID) | ORAL | Status: DC
  Filled 2017-11-30 (×2): qty 1

## 2017-11-30 MED ORDER — LORAZEPAM 2 MG/ML IJ SOLN
2.0000 mg | Freq: Once | INTRAMUSCULAR | Status: AC
  Administered 2017-11-30: 2 mg via INTRAMUSCULAR
  Filled 2017-11-30: qty 1

## 2017-11-30 MED ORDER — CLONIDINE HCL 0.3 MG/24HR TD PTWK
0.3000 mg | MEDICATED_PATCH | TRANSDERMAL | Status: DC
  Administered 2017-11-30: 0.3 mg via TRANSDERMAL
  Filled 2017-11-30: qty 1

## 2017-11-30 MED ORDER — FOLIC ACID 1 MG PO TABS
1.0000 mg | ORAL_TABLET | Freq: Every day | ORAL | Status: DC
  Administered 2017-11-30: 1 mg via ORAL
  Filled 2017-11-30: qty 1

## 2017-11-30 MED ORDER — GABAPENTIN 300 MG PO CAPS: 300.0000 mg | ORAL_CAPSULE | Freq: Three times a day (TID) | ORAL | 0 refills | Status: DC

## 2017-11-30 MED ORDER — STERILE WATER FOR INJECTION IJ SOLN
INTRAMUSCULAR | Status: AC
  Administered 2017-11-30: 2 mL
  Filled 2017-11-30: qty 10

## 2017-11-30 MED ORDER — METFORMIN HCL 500 MG PO TABS
500.0000 mg | ORAL_TABLET | Freq: Two times a day (BID) | ORAL | Status: DC
  Administered 2017-11-30: 500 mg via ORAL
  Filled 2017-11-30: qty 1

## 2017-11-30 NOTE — BH Assessment (Signed)
[facility] Assessment Progress Note  Per Lindon Romp, NP pt is recommended for inpt treatment. EDP Ward, Delice Bison, DO notified and in agreement with disposition. Pt is VOL at present. EDP agrees to IVC the pt if he attempts to elope. [name] nurse aware of inpt recommendation.   Lind Covert, MSW, LCSW Therapeutic Triage Specialist  [phone]

## 2017-11-30 NOTE — ED Notes (Signed)
Patient refuses to let staff take off old ED band on right wrist. New ID band placed on left wrist.

## 2017-11-30 NOTE — ED Notes (Signed)
Catapres patch applied at 3am  Right arm  Labeled.

## 2017-11-30 NOTE — ED Triage Notes (Signed)
Patient arrives by [facility] with complaints of "being crazy". Patient told EMS he is hearing voices-aggressive and with homicidal ideation. EMS unable to get vital signs.

## 2017-11-30 NOTE — ED Provider Notes (Addendum)
TIME SEEN: 1:32 AM  CHIEF COMPLAINT: Suicidal thoughts, homicidal thoughts  HPI: Patient is a 57 year old male with history of hypertension, diabetes, CVA, substance abuse, medical noncompliance who presents to the emergency department requesting psychiatric placement.  He states that he is having homicidal thoughts and wants to "throw gasoline on people".  He also states that he wants to kill himself.  No specific plan.  Reports he is having auditory hallucinations but states he does not feel comfortable telling me what they are saying.  He endorses alcohol and crack cocaine use.  Denies any acute medical issue.  Does state that he is hurting all over but states this is chronic.  No chest pain or shortness of breath.  No abdominal pain.  No fever, cough, vomiting or diarrhea.  States he has been out of his medications for several months.  ROS: See HPI Constitutional: no fever  Eyes: no drainage  ENT: no runny nose   Cardiovascular:  no chest pain  Resp: no SOB  GI: no vomiting GU: no dysuria Integumentary: no rash  Allergy: no hives  Musculoskeletal: no leg swelling  Neurological: no slurred speech ROS otherwise negative  PAST MEDICAL HISTORY/PAST SURGICAL HISTORY:  Past Medical History:  Diagnosis Date  . Alcohol abuse   . Arthritis   . Crack cocaine use   . Depression   . Diabetes mellitus   . Drug abuse ([place])   . DTs (delirium tremens) ([place])    history of   . Gout   . Noncompliance with medication regimen   . Schizophrenia ([place])   . Stroke ([place])   . Uncontrolled hypertension     MEDICATIONS:  Prior to Admission medications   Medication Sig Start Date End Date Taking? Authorizing Provider  amLODipine (NORVASC) 10 MG tablet Take 1 tablet (10 mg total) by mouth daily. 11/28/17   Barton Dubois, MD  cloNIDine (CATAPRES - DOSED IN MG/24 HR) 0.3 mg/24hr patch Place 1 patch (0.3 mg total) onto the skin once a week. 11/28/17   Barton Dubois, MD  DULoxetine (CYMBALTA) 30 MG  capsule Take 1 capsule (30 mg total) by mouth 2 (two) times daily. 11/28/17   Barton Dubois, MD  folic acid (FOLVITE) 1 MG tablet Take 1 tablet (1 mg total) by mouth daily. 11/29/17   Barton Dubois, MD  gabapentin (NEURONTIN) 100 MG capsule Take 2 capsules (200 mg total) by mouth 3 (three) times daily. 11/28/17   Barton Dubois, MD  hydrALAZINE (APRESOLINE) 50 MG tablet Take 1 tablet (50 mg total) by mouth 3 (three) times daily. 11/28/17   Barton Dubois, MD  magnesium oxide (MAG-OX) 400 (241.3 Mg) MG tablet Take 1 tablet (400 mg total) by mouth daily. 11/29/17   Barton Dubois, MD  metFORMIN (GLUCOPHAGE) 500 MG tablet Take 1 tablet (500 mg total) by mouth 2 (two) times daily with a meal. 11/28/17   Barton Dubois, MD  paliperidone (INVEGA SUSTENNA) 156 MG/ML SUSP injection Inject 156 mg into the muscle every 30 (thirty) days.    [provider]  thiamine 100 MG tablet Take 1 tablet (100 mg total) by mouth daily. 11/29/17   Barton Dubois, MD    ALLERGIES:  No Known Allergies  SOCIAL HISTORY:  Social History   Tobacco Use  . Smoking status: Current Every Day Smoker    Packs/day: 0.25    Types: Cigarettes  . Smokeless tobacco: Never Used  Substance Use Topics  . Alcohol use: Yes    Alcohol/week: 0.6 - 1.2 oz  Types: 1 - 2 Cans of beer per week    Comment: [facility]    FAMILY HISTORY: Family History  Problem Relation Age of Onset  . Hypertension Unknown     EXAM: BP (!) 149/99   Pulse 94   Temp 97.9 F (36.6 C) (Oral)   Resp 20   SpO2 98%  CONSTITUTIONAL: Alert and oriented and responds appropriately to questions.  Chronically ill-appearing, slightly agitated HEAD: Normocephalic EYES: Conjunctivae clear, pupils appear equal, EOMI ENT: normal nose; moist mucous membranes NECK: Supple, no meningismus, no nuchal rigidity, no LAD  CARD: RRR; S1 and S2 appreciated; no murmurs, no clicks, no rubs, no gallops RESP: Normal chest excursion without splinting or tachypnea;  breath sounds clear and equal bilaterally; no wheezes, no rhonchi, no rales, no hypoxia or respiratory distress, speaking full sentences ABD/GI: Normal bowel sounds; non-distended; soft, non-tender, no rebound, no guarding, no peritoneal signs, no hepatosplenomegaly BACK:  The back appears normal and is non-tender to palpation, there is no CVA tenderness EXT: Normal ROM in all joints; non-tender to palpation; no edema; normal capillary refill; no cyanosis, no calf tenderness or swelling    SKIN: Normal color for age and race; warm; no rash NEURO: Moves all extremities equally PSYCH: Patient here endorsing SI, HI and auditory hallucinations.  He appears slightly agitated.  MEDICAL DECISION MAKING: Patient here with SI, HI.  He is hypertensive but has a known history of medical noncompliance.  I will reorder his clonidine, hydralazine and amlodipine.  Will obtain labs, urine for medical clearance.  Will consult TTS.  Patient is here voluntarily and he is actively requesting placement in a psychiatric facility.  He is also asking something to help him calm down.  Will give IM Ativan.  ED PROGRESS: Patient's labs and urine show that he has alcohol level of 183 and a drug screen positive for cocaine.  He does have moderate hemoglobin in his urine and also has proteinuria which appears to be chronic for patient likely from uncontrolled hypertension.  Creatinine is normal.  No signs of hypertensive emergency.  Blood pressure improving quickly once patient is more sedate and after blood pressure medication.  I do not feel he needs medical admission and I feel he is cleared medically and would benefit from psychiatric placement.  Again patient is here voluntarily but if he were to decide to leave the hospital he would need to be IVC'ed. Patient also requested another "shot" to help him feel calm.  Patient given IM Geodon.  Awaiting psychiatric disposition but I would recommend psychiatric inpatient  treatment.   Behavioral health recommends inpatient psychiatric treatment.  Awaiting placement.  I reviewed all nursing notes, vitals, pertinent previous records, EKGs, lab and urine results, imaging (as available).      Ward, Delice Bison, DO 11/30/17 [place], Delice Bison, DO 11/30/17 [phone]

## 2017-11-30 NOTE — ED Notes (Signed)
Bed: PZ98 Expected date:  Expected time:  Means of arrival:  Comments: EMS 57 [facility]-stating he is crazy

## 2017-11-30 NOTE — ED Notes (Signed)
Old band removed, pt gowned and sleeping on monitor

## 2017-11-30 NOTE — BHH Suicide Risk Assessment (Signed)
Suicide Risk Assessment  Discharge Assessment   [facility] Discharge Suicide Risk Assessment   Principal Problem: Alcohol abuse with alcohol-induced mood disorder [facility]) Discharge Diagnoses:  Patient Active Problem List   Diagnosis Date Noted  . Cocaine abuse ([place]) [F14.10]     Priority: High  . Alcohol use disorder, severe, dependence ([place]) [F10.20] 08/30/2015    Priority: High  . Alcohol abuse with alcohol-induced mood disorder ([place]) [F10.14] 08/30/2015    Priority: High  . Schizoaffective disorder, depressive type ([place]) [F25.1]     Priority: High  . Hypokalemia [E87.6]   . Schizoaffective disorder, bipolar type ([place]) [F25.0] 11/22/2017  . Trauma [T14.90XA]   . Tachycardia [R00.0] 03/03/2017  . Alcohol abuse [F10.10]   . Hypertensive urgency [I16.0]   . Tachycardia [R00.0]   . Cerebrovascular disease [I67.9] 09/02/2015  . 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 Malignant hypertension [I10] 08/30/2015  . Alcohol withdrawal ([place]) [F10.239] 08/30/2015  . Thrombocytopenia ([place]) [D69.6] 08/27/2015  . Tobacco use disorder [F17.200] 08/26/2015  . Hyperlipidemia [E78.5] 04/28/2015  . Gout [M10.9] 08/09/2013  . Diabetes mellitus ([place]) [E11.9] 07/09/2012    Total Time spent with patient: 45 minutes  Musculoskeletal: Strength & Muscle Tone: within normal limits Gait & Station: normal Patient leans: N/A  Psychiatric Specialty Exam:   Blood pressure 134/89, pulse 94, temperature 97.9 F (36.6 C), temperature source Oral, resp. rate 19, SpO2 95 %.There is no height or weight on file to calculate BMI.  General Appearance: Casual  Eye Contact::  Good  Speech:  Normal Rate409  Volume:  Normal  Mood:  Euthymic  Affect:  Congruent  Thought Process:  Coherent and Descriptions of Associations: Intact  Orientation:  Full (Time, Place, and Person)  Thought Content:  WDL and Logical  Suicidal Thoughts:  No  Homicidal Thoughts:  No   Memory:  Immediate;   Good Recent;   Good Remote;   Good  Judgement:  Fair  Insight:  Good  Psychomotor Activity:  Normal  Concentration:  Good  Recall:  Good  Fund of Knowledge:Fair  Language: Good  Akathisia:  No  Handed:  Right  AIMS (if indicated):     Assets:  Leisure Time Physical Health Resilience  Sleep:     Cognition: WNL  ADL's:  Intact   Mental Status Per Nursing Assessment::   On Admission:   57 yo male well known to this provider and ED for substance abuse presentations.  Recently at [facility] for detox.  Unfortunately, he does not use his outpatient resources but returns to using drugs or alcohol despite a multitude of rehabs and detoxs.  No suicidal/homicidal ideations, hallucinations, or withdrawal symptoms.  Peer support consult placed and gabapentin Rx provided for any withdrawal issues.  Demographic Factors:  Male  Loss Factors: NA  Historical Factors: NA  Risk Reduction Factors:   Sense of responsibility to family  Continued Clinical Symptoms:  None  Cognitive Features That Contribute To Risk:  None    Suicide Risk:  Minimal: No identifiable suicidal ideation.  Patients presenting with no risk factors but with morbid ruminations; may be classified as minimal risk based on the severity of the depressive symptoms    Plan Of Care/Follow-up recommendations:  Activity:  as tolerated Diet:  heart healthy diet  LORD, [name], NP 11/30/2017, 12:37 PM

## 2017-11-30 NOTE — ED Notes (Signed)
2 bags of personal belongings were placed in over head cabinets 16-18

## 2017-11-30 NOTE — BH Assessment (Addendum)
Assessment Note  [name] is an 57 y.o. male who presents to the ED voluntarily. Pt aggressive throughout the assessment and making threats to ED staff. Pt is yelling "hes' a faggot punk" to individuals who are not present. Pt actively hallucinating and tells this writer there are demons standing behind me holding knives. Pt states he wants to kill himself if he does not get into [facility] or [facility] demanding this writer to admit him to either hospital. Pt states "I'm crazy. I'm going to take this blood pressure shit off and I'm going to do something to show ya'll just how crazy I am." Pt states he has been trying to kill himself by OD on percocet. [name] nurse reported he told him he wanted to pour gasoline on himself and set himself on fire. Pt stated "I'm going to fuck that doctor up." Pt states he feels he is not going to get help at the hospital because "they are all White, if I was White they would help me. You're not going to help me I can tell by how you look." Pt continues to repeat "I'm not trying to go back to prison." When asked if he has any current legal charges pt refuses to disclose.   Pt combative and refuses to disclose his details of substance abuse. Pt labs are positive for cocaine. Pt has an extensive hx of inpt hospitalizations c/o similar concerns. Pt was recently admitted to [facility] on 11/26/17. Pt states he was given homeless shelter resources and pt stated "I'm not going to no fucking shelter. I will kill those people."  Diagnosis: Schizophrenia; Cocaine Use Disorder; Alcohol Use Disorder  Past Medical History:  Past Medical History:  Diagnosis Date  . Alcohol abuse   . Arthritis   . Crack cocaine use   . Depression   . Diabetes mellitus   . Drug abuse ([place])   . DTs (delirium tremens) ([place])    history of   . Gout   . Noncompliance with medication regimen   . Schizophrenia ([place])   . Stroke ([place])   . Uncontrolled hypertension     Past Surgical  History:  Procedure Laterality Date  . MANDIBLE RECONSTRUCTION    . TONSILLECTOMY      Family History:  Family History  Problem Relation Age of Onset  . Hypertension Unknown     Social History:  reports that he has been smoking cigarettes.  He has been smoking about 0.25 packs per day. he has never used smokeless tobacco. He reports that he drinks about 0.6 - 1.2 oz of alcohol per week. He reports that he uses drugs. Drugs: "Crack" cocaine and Cocaine.  Additional Social History:  Alcohol / Drug Use Pain Medications: see MAR Prescriptions: see MAR Over the Counter: see MAR History of alcohol / drug use?: Yes Longest period of sobriety (when/how long): unknown, pt is uncooperative Negative Consequences of Use: Financial, Legal, Personal relationships, Work / School Substance #1 Name of Substance 1: Alcohol  1 - Age of First Use: 19 1 - Amount (size/oz): unknown, pt refuses to disclose 1 - Frequency: daily 1 - Duration: ongoing 1 - Last Use / Amount: unknown, pt refuses to disclose Substance #2 Name of Substance 2: cocaine 2 - Age of First Use: 22 2 - Amount (size/oz): unknown, pt refuses to disclose 2 - Frequency: daily per chart 2 - Duration: ongoing 2 - Last Use / Amount: unknown, pt refuses to disclose  CIWA: CIWA-Ar BP: Marland Kitchen)  160/111 Pulse Rate: (!) 105 COWS:    Allergies: No Known Allergies  Home Medications:  (Not in a hospital admission)  OB/GYN Status:  No LMP for male patient.  General Assessment Data [facility] of [facility]: [facility] Assessment: In system Is this a Tele or Face-to-Face Assessment?: Face-to-Face Is this an Initial Assessment or a Re-assessment for this encounter?: Initial Assessment Marital status: Separated Is patient pregnant?: No Pregnancy Status: No Living Arrangements: Other (Comment)(homeless) Can pt return to current living arrangement?: Yes Admission Status: Voluntary Is patient capable of signing voluntary admission?:  Yes Referral Source: Self/Family/Friend Insurance type: Medicaid     Crisis Care Plan Living Arrangements: Other (Comment)(homeless) Name of Psychiatrist: W[facility] Name of Therapist: [name]  Education Status Is patient currently in school?: No Highest grade of school patient has completed: 11th grade(per hx)  Risk to self with the past 6 months Suicidal Ideation: Yes-Currently Present Has patient been a risk to self within the past 6 months prior to admission? : Yes Suicidal Intent: Yes-Currently Present Has patient had any suicidal intent within the past 6 months prior to admission? : Yes Is patient at risk for suicide?: Yes Suicidal Plan?: Yes-Currently Present Has patient had any suicidal plan within the past 6 months prior to admission? : Yes Specify Current Suicidal Plan: pt states he wants to OD on percocet or set himself on fire  Access to Means: Yes What has been your use of drugs/alcohol within the last 12 months?: pt claims he has access to percocet and states he would use gasoline to set himself on fire  Previous Attempts/Gestures: Yes How many times?: (multiple ) Triggers for Past Attempts: Hallucinations, Unpredictable, Other personal contacts, Family contact Intentional Self Injurious Behavior: None Family Suicide History: Unknown Recent stressful life event(s): Financial Problems, Other (Comment)(homeless, substance abuse, AVH) Persecutory voices/beliefs?: Yes Depression: No Depression Symptoms: Feeling angry/irritable Substance abuse history and/or treatment for substance abuse?: Yes Suicide prevention information given to non-admitted patients: Not applicable  Risk to Others within the past 6 months Homicidal Ideation: Yes-Currently Present Does patient have any lifetime risk of violence toward others beyond the six months prior to admission? : Yes (comment)(pt making threats to ED staff ) Thoughts of Harm to Others: Yes-Currently Present Comment - Thoughts  of Harm to Others: pt reports to this Probation officer he wants to "fuck up the [facility] bitch doctor if I don't get some help" Current Homicidal Intent: No Current Homicidal Plan: No Access to Homicidal Means: No History of harm to others?: Yes Assessment of Violence: On admission Violent Behavior Description: pt making threats to ED staff Does patient have access to weapons?: (unknown, pt refuses to disclose ) Criminal Charges Pending?: No(pt denies) Does patient have a court date: No Is patient on probation?: No  Psychosis Hallucinations: Auditory, Visual Delusions: None noted  Mental Status Report Appearance/Hygiene: Disheveled, Poor hygiene Eye Contact: Good Motor Activity: Restlessness Speech: Loud, Aggressive, Argumentative Level of Consciousness: Alert, Irritable, Combative Mood: Angry, Anxious, Threatening Affect: Anxious, Angry, Irritable, Threatening Anxiety Level: Moderate Thought Processes: Scientist, research (life sciences), Tangential Judgement: Impaired Orientation: Person, Place, Time, Situation Obsessive Compulsive Thoughts/Behaviors: None  Cognitive Functioning Concentration: Fair Memory: Remote Intact, Recent Intact IQ: Average Insight: Poor Impulse Control: Poor Appetite: Good Sleep: Unable to Assess Vegetative Symptoms: Unable to Assess  ADLScreening [facility] Assessment Services) Patient's cognitive ability adequate to safely complete daily activities?: Yes Patient able to express need for assistance with ADLs?: Yes Independently performs ADLs?: Yes (appropriate for developmental age)  Prior Inpatient  Therapy Prior Inpatient Therapy: Yes Prior Therapy Dates: 2019, 2018, 2016 Prior Therapy Facilty/Provider(s): [place], [place], [place], [facility] Reason for Treatment: SCHIZOAFFECTIVE D/O  Prior Outpatient Therapy Prior Outpatient Therapy: Yes Prior Therapy Dates: CURRENT Prior Therapy Facilty/Provider(s): [facility] Reason for Treatment: MED MANAGEMENT Does patient have an  ACCT team?: No Does patient have Intensive In-House Services?  : No Does patient have [name] services? : Yes Does patient have P4CC services?: No  ADL Screening (condition at time of admission) Patient's cognitive ability adequate to safely complete daily activities?: Yes Is the patient deaf or have difficulty hearing?: No Does the patient have difficulty seeing, even when wearing glasses/contacts?: No Does the patient have difficulty concentrating, remembering, or making decisions?: Yes Patient able to express need for assistance with ADLs?: Yes Does the patient have difficulty dressing or bathing?: No Independently performs ADLs?: Yes (appropriate for developmental age) Does the patient have difficulty walking or climbing stairs?: No Weakness of Legs: Both Weakness of Arms/Hands: None  Home Assistive Devices/Equipment Home Assistive Devices/Equipment: None    Abuse/Neglect Assessment (Assessment to be complete while patient is alone) Abuse/Neglect Assessment Can Be Completed: Yes(information obtained per chart hx ) Physical Abuse: Denies Verbal Abuse: Denies Sexual Abuse: Denies Exploitation of patient/patient's resources: Denies Self-Neglect: Denies     Regulatory affairs officer (For Healthcare) Does Patient Have a Medical Advance Directive?: No Would patient like information on creating a medical advance directive?: No - Patient declined    Additional Information 1:1 In Past 12 Months?: No CIRT Risk: Yes Elopement Risk: Yes Does patient have medical clearance?: Yes     Disposition: Per Lindon Romp, NP pt is recommended for inpt treatment. EDP Ward, Delice Bison, DO notified and in agreement with disposition. Pt is VOL at present. EDP agrees to IVC the pt if he attempts to elope. [name] nurse aware of inpt recommendation.    Disposition Initial Assessment Completed for this Encounter: Yes Disposition of Patient: Inpatient treatment program Type of inpatient treatment program:  Adult(PER Lindon Romp, NP)  On Site Evaluation by:   Reviewed with Physician:    Lyanne Co 11/30/2017 2:12 AM

## 2017-12-01 ENCOUNTER — Emergency Department (HOSPITAL_COMMUNITY)
Admission: EM | Admit: 2017-12-01 | Discharge: 2017-12-01 | Disposition: A | Discharge status: Home / Self Care | Payer: Medicaid Other | Attending: Emergency Medicine | Admitting: Emergency Medicine

## 2017-12-01 ENCOUNTER — Emergency Department (HOSPITAL_COMMUNITY): Payer: Medicaid Other

## 2017-12-01 ENCOUNTER — Other Ambulatory Visit: Payer: Self-pay

## 2017-12-01 ENCOUNTER — Encounter (HOSPITAL_COMMUNITY): Payer: Self-pay | Admitting: Licensed Clinical Social Worker

## 2017-12-01 DIAGNOSIS — F17210 Nicotine dependence, cigarettes, uncomplicated: Secondary | ICD-10-CM | POA: Insufficient documentation

## 2017-12-01 DIAGNOSIS — Z79899 Other long term (current) drug therapy: Secondary | ICD-10-CM | POA: Diagnosis not present

## 2017-12-01 DIAGNOSIS — R441 Visual hallucinations: Secondary | ICD-10-CM | POA: Insufficient documentation

## 2017-12-01 DIAGNOSIS — Z7984 Long term (current) use of oral hypoglycemic drugs: Secondary | ICD-10-CM | POA: Diagnosis not present

## 2017-12-01 DIAGNOSIS — R45851 Suicidal ideations: Secondary | ICD-10-CM | POA: Insufficient documentation

## 2017-12-01 DIAGNOSIS — F250 Schizoaffective disorder, bipolar type: Secondary | ICD-10-CM | POA: Diagnosis present

## 2017-12-01 DIAGNOSIS — R440 Auditory hallucinations: Secondary | ICD-10-CM | POA: Diagnosis present

## 2017-12-01 DIAGNOSIS — Z008 Encounter for other general examination: Secondary | ICD-10-CM

## 2017-12-01 DIAGNOSIS — I10 Essential (primary) hypertension: Secondary | ICD-10-CM | POA: Diagnosis not present

## 2017-12-01 DIAGNOSIS — E119 Type 2 diabetes mellitus without complications: Secondary | ICD-10-CM | POA: Insufficient documentation

## 2017-12-01 LAB — CBC WITH DIFFERENTIAL/PLATELET
BASOS PCT: 0 %
Basophils Absolute: 0 10*3/uL (ref 0.0–0.1)
EOS ABS: 0.1 10*3/uL (ref 0.0–0.7)
EOS PCT: 1 %
HCT: 41.8 % (ref 39.0–52.0)
Hemoglobin: 13.9 g/dL (ref 13.0–17.0)
LYMPHS ABS: 3.3 10*3/uL (ref 0.7–4.0)
Lymphocytes Relative: 42 %
MCH: 30 pg (ref 26.0–34.0)
MCHC: 33.3 g/dL (ref 30.0–36.0)
MCV: 90.1 fL (ref 78.0–100.0)
MONOS PCT: 10 %
Monocytes Absolute: 0.8 10*3/uL (ref 0.1–1.0)
Neutro Abs: 3.7 10*3/uL (ref 1.7–7.7)
Neutrophils Relative %: 47 %
Platelets: 165 10*3/uL (ref 150–400)
RBC: 4.64 MIL/uL (ref 4.22–5.81)
RDW: 12.6 % (ref 11.5–15.5)
WBC: 7.9 10*3/uL (ref 4.0–10.5)

## 2017-12-01 LAB — COMPREHENSIVE METABOLIC PANEL
ALBUMIN: 3.8 g/dL (ref 3.5–5.0)
ALK PHOS: 74 U/L (ref 38–126)
ALT: 54 U/L (ref 17–63)
ANION GAP: 10 (ref 5–15)
AST: 139 U/L — AB (ref 15–41)
BILIRUBIN TOTAL: 0.5 mg/dL (ref 0.3–1.2)
BUN: 13 mg/dL (ref 6–20)
CALCIUM: 9 mg/dL (ref 8.9–10.3)
CO2: 26 mmol/L (ref 22–32)
CREATININE: 0.92 mg/dL (ref 0.61–1.24)
Chloride: 101 mmol/L (ref 101–111)
GFR calc Af Amer: >60 mL/min (ref >60)
GFR calc non Af Amer: >60 mL/min (ref >60)
GLUCOSE: 133 mg/dL — AB (ref 65–99)
Potassium: 3.3 mmol/L — ABNORMAL LOW (ref 3.5–5.1)
Sodium: 137 mmol/L (ref 135–145)
TOTAL PROTEIN: 8 g/dL (ref 6.5–8.1)

## 2017-12-01 LAB — ETHANOL: Alcohol, Ethyl (B): 87 mg/dL — ABNORMAL HIGH (ref <10)

## 2017-12-01 LAB — ACETAMINOPHEN LEVEL

## 2017-12-01 LAB — SALICYLATE LEVEL

## 2017-12-01 MED ORDER — HYDRALAZINE HCL 50 MG PO TABS
50.0000 mg | ORAL_TABLET | Freq: Three times a day (TID) | ORAL | Status: DC
  Administered 2017-12-01: 50 mg via ORAL
  Filled 2017-12-01 (×2): qty 1

## 2017-12-01 MED ORDER — ACETAMINOPHEN 325 MG PO TABS: 650.0000 mg | ORAL_TABLET | ORAL | Status: DC | PRN

## 2017-12-01 MED ORDER — CLONIDINE HCL 0.3 MG/24HR TD PTWK: 0.3000 mg | MEDICATED_PATCH | TRANSDERMAL | Status: DC

## 2017-12-01 MED ORDER — PALIPERIDONE PALMITATE 156 MG/ML IM SUSP: 156.0000 mg | INTRAMUSCULAR | Status: DC

## 2017-12-01 MED ORDER — AMLODIPINE BESYLATE 5 MG PO TABS
10.0000 mg | ORAL_TABLET | Freq: Every day | ORAL | Status: DC
  Administered 2017-12-01: 10 mg via ORAL
  Filled 2017-12-01: qty 2

## 2017-12-01 MED ORDER — METFORMIN HCL 500 MG PO TABS
500.0000 mg | ORAL_TABLET | Freq: Two times a day (BID) | ORAL | Status: DC
  Administered 2017-12-01: 500 mg via ORAL
  Filled 2017-12-01: qty 1

## 2017-12-01 MED ORDER — DULOXETINE HCL 30 MG PO CPEP
30.0000 mg | ORAL_CAPSULE | Freq: Two times a day (BID) | ORAL | Status: DC
  Administered 2017-12-01: 30 mg via ORAL
  Filled 2017-12-01: qty 1

## 2017-12-01 MED ORDER — MAGNESIUM OXIDE 400 (241.3 MG) MG PO TABS
400.0000 mg | ORAL_TABLET | Freq: Every day | ORAL | Status: DC
  Administered 2017-12-01: 400 mg via ORAL
  Filled 2017-12-01: qty 1

## 2017-12-01 MED ORDER — POTASSIUM CHLORIDE CRYS ER 20 MEQ PO TBCR
20.0000 meq | EXTENDED_RELEASE_TABLET | Freq: Once | ORAL | Status: AC
  Administered 2017-12-01: 20 meq via ORAL
  Filled 2017-12-01: qty 1

## 2017-12-01 MED ORDER — VITAMIN B-1 100 MG PO TABS
100.0000 mg | ORAL_TABLET | Freq: Every day | ORAL | Status: DC
Start: 2017-12-01 — End: 2017-12-01
  Administered 2017-12-01: 100 mg via ORAL
  Filled 2017-12-01: qty 1

## 2017-12-01 MED ORDER — FOLIC ACID 1 MG PO TABS
1.0000 mg | ORAL_TABLET | Freq: Every day | ORAL | Status: DC
  Administered 2017-12-01: 1 mg via ORAL
  Filled 2017-12-01: qty 1

## 2017-12-01 MED ORDER — GABAPENTIN 300 MG PO CAPS
300.0000 mg | ORAL_CAPSULE | Freq: Three times a day (TID) | ORAL | Status: DC
  Administered 2017-12-01: 300 mg via ORAL
  Filled 2017-12-01: qty 1

## 2017-12-01 NOTE — BHH Suicide Risk Assessment (Signed)
Suicide Risk Assessment  Discharge Assessment   [facility] Discharge Suicide Risk Assessment   Principal Problem: Schizoaffective disorder, bipolar type [facility]) Discharge Diagnoses:  Patient Active Problem List   Diagnosis Date Noted  . Hypokalemia [E87.6]   . Schizoaffective disorder, bipolar type ([place]) [F25.0] 11/22/2017  . Trauma [T14.90XA]   . Tachycardia [R00.0] 03/03/2017  . Alcohol abuse [F10.10]   . Cocaine abuse ([place]) [F14.10]   . Hypertensive urgency [I16.0]   . Tachycardia [R00.0]   . Cerebrovascular disease [I67.9] 09/02/2015  . 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 Malignant hypertension [I10] 08/30/2015  . Alcohol use disorder, severe, dependence ([place]) [F10.20] 08/30/2015  . Alcohol withdrawal ([place]) [F10.239] 08/30/2015  . Alcohol abuse with alcohol-induced mood disorder ([place]) [F10.14] 08/30/2015  . Thrombocytopenia ([place]) [D69.6] 08/27/2015  . Tobacco use disorder [F17.200] 08/26/2015  . Hyperlipidemia [E78.5] 04/28/2015  . Schizoaffective disorder, depressive type ([place]) [F25.1]   . Gout [M10.9] 08/09/2013  . Diabetes mellitus ([place]) [E11.9] 07/09/2012    Total Time spent with patient: 30 minutes  Musculoskeletal: Strength & Muscle Tone: within normal limits Gait & Station: normal Patient leans: Backward  Psychiatric Specialty Exam:   Blood pressure (!) 155/103, pulse 89, temperature 97.6 F (36.4 C), temperature source Oral, resp. rate 18, SpO2 98 %.There is no height or weight on file to calculate BMI.  General Appearance: Casual  Eye Contact::  Fair  Speech:  Clear and [id number]  Volume:  Normal  Mood:  Depressed  Affect:  Congruent and Depressed  Thought Process:  Coherent, Goal Directed and Linear  Orientation:  Full (Time, Place, and Person)  Thought Content:  Logical  Suicidal Thoughts:  No  Homicidal Thoughts:  No  Memory:  Immediate;   Good Recent;   Good Remote;   Fair  Judgement:  Fair   Insight:  Fair  Psychomotor Activity:  Normal  Concentration:  Good  Recall:  Good  Fund of Knowledge:Good  Language: Good  Akathisia:  No  Handed:  Right  AIMS (if indicated):     Assets:  Warehouse manager Resources/Insurance  Sleep:     Cognition: WNL  ADL's:  Intact   Mental Status Per Nursing Assessment::   On Admission:   Depressed  Demographic Factors:  Male, Low socioeconomic status and Unemployed  Loss Factors: Financial problems/change in socioeconomic status  Historical Factors: Impulsivity  Risk Reduction Factors:   Sense of responsibility to family  Continued Clinical Symptoms:  Depression:   Impulsivity Alcohol/Substance Abuse/Dependencies  Cognitive Features That Contribute To Risk:  Closed-mindedness    Suicide Risk:  Minimal: No identifiable suicidal ideation.  Patients presenting with no risk factors but with morbid ruminations; may be classified as minimal risk based on the severity of the depressive symptoms    Plan Of Care/Follow-up recommendations:  Activity:  as tolerated Diet:  Heart Healthy  Ethelene Hal, NP 12/01/2017, 10:38 AM

## 2017-12-01 NOTE — BH Assessment (Addendum)
Assessment Note  [name] is an 57 y.o. male that presents this date after being discharged yesterday 11/30/16 due to not meeting inpatient criteria. Patient presented back this date reporting AH after drinking "two beers." Patient is vague in reference to content of AH and is denying any S/I, H/I or VH. Patient states he is currently homeless and admitted that "he doesn't have any place to go." Patient has a extensive history of admissions to [facility] with the last one on 11/30/16 presenting with similar symptoms being impaired at that time. Patient's UDS this date is pending although per note review was positive for cocaine on 11/30/17 with a BAL of 183. [facility] also [facility] with a BAL of 190. Patient has met with peer support several times and provided with OP resources and prescriptions that patient fails to follow up with. Patient is noted to have his prescription from yesterdays discharge in his chart. Patient is oriented to time/place and speaks with a slow soft voice. Patient is noted to be drowsy and renders limited history in reference to the incident that brought patient to ED this date. Patient stated "he needed to sleep" but was "ready to go when he woke up." Per admission notes this date, patient contacted 911 reporting he was hearing voices (non-command) and was seen at [facility] less than 24 hrs ago presenting with discharge papers from 11/1316. Dwyane Dee MD, Romilda Garret FNP recommended patient be discharged and encouraged to follow up with OP resources provided on discharge.   Diagnosis:  F25.0 Schizoaffective disorder, bipolar type, alcohol abuse, cocaine abuse   Past Medical History:  Past Medical History:  Diagnosis Date  . Alcohol abuse   . Arthritis   . Crack cocaine use   . Depression   . Diabetes mellitus   . Drug abuse ([place])   . DTs (delirium tremens) ([place])    history of   . Gout   . Noncompliance with medication regimen   . Schizophrenia ([place])   . Stroke ([place])    . Uncontrolled hypertension     Past Surgical History:  Procedure Laterality Date  . MANDIBLE RECONSTRUCTION    . TONSILLECTOMY      Family History:  Family History  Problem Relation Age of Onset  . Hypertension Unknown     Social History:  reports that he has been smoking cigarettes.  He has been smoking about 0.25 packs per day. he has never used smokeless tobacco. He reports that he drinks about 0.6 - 1.2 oz of alcohol per week. He reports that he uses drugs. Drugs: "Crack" cocaine and Cocaine.  Additional Social History:  Alcohol / Drug Use Pain Medications: see MAR Prescriptions: see MAR Over the Counter: see MAR History of alcohol / drug use?: Yes Longest period of sobriety (when/how long): Unknow Negative Consequences of Use: (Pt declines to answer) Withdrawal Symptoms: (Pt declines to answer) Substance #1 Name of Substance 1: Alcohol  1 - Age of First Use: 19 1 - Amount (size/oz): unknown, pt refuses to disclose 1 - Frequency: daily 1 - Duration: ongoing 1 - Last Use / Amount: unknown, pt refuses to disclose Substance #2 Name of Substance 2: cocaine 2 - Age of First Use: 22 2 - Amount (size/oz): unknown, pt refuses to disclose 2 - Frequency: daily per chart 2 - Duration: ongoing 2 - Last Use / Amount: unknown, pt refuses to disclose  CIWA: CIWA-Ar BP: (!) 155/103 Pulse Rate: 89 COWS:    Allergies: No Known  Allergies  Home Medications:  (Not in a hospital admission)  OB/GYN Status:  No LMP for male patient.  General Assessment Data [facility] Assessment: In system Is this a Tele or Face-to-Face Assessment?: Face-to-Face Is this an Initial Assessment or a Re-assessment for this encounter?: Initial Assessment Marital status: Separated Maiden name: NA Is patient pregnant?: No Pregnancy Status: No Living Arrangements: Alone Can pt return to current living arrangement?: Yes Admission Status: Voluntary Is patient capable of  signing voluntary admission?: Yes Referral Source: Self/Family/Friend Insurance type: Medicaid  Medical Screening Exam ([place]) Medical Exam completed: Yes  Crisis Care Plan Living Arrangements: Alone Legal Guardian: (NA) Name of Psychiatrist: [place] Name of Therapist: [name]  Education Status Is patient currently in school?: No Current Grade: (NA) Highest grade of school patient has completed: (11th) Name of school: (NA) Contact person: (NA)  Risk to self with the past 6 months Suicidal Ideation: No Has patient been a risk to self within the past 6 months prior to admission? : Yes Suicidal Intent: No Has patient had any suicidal intent within the past 6 months prior to admission? : No Is patient at risk for suicide?: Yes Suicidal Plan?: No Has patient had any suicidal plan within the past 6 months prior to admission? : No Specify Current Suicidal Plan: (NA) Access to Means: No Specify Access to Suicidal Means: (NA) What has been your use of drugs/alcohol within the last 12 months?: Current use Previous Attempts/Gestures: Yes How many times?: 3(Per notes) Other Self Harm Risks: (NA) Triggers for Past Attempts: Unknown Intentional Self Injurious Behavior: None Family Suicide History: Unknown Recent stressful life event(s): Other (Comment)(Excessive SA use, med non-compliance) Persecutory voices/beliefs?: No Depression: No Depression Symptoms: (NA) Substance abuse history and/or treatment for substance abuse?: Yes Suicide prevention information given to non-admitted patients: Not applicable  Risk to Others within the past 6 months Homicidal Ideation: No Does patient have any lifetime risk of violence toward others beyond the six months prior to admission? : No Thoughts of Harm to Others: No Comment - Thoughts of Harm to Others: NA Current Homicidal Intent: No Current Homicidal Plan: No Describe Current Homicidal Plan: NA Access to Homicidal Means:  No Describe Access to Homicidal Means: NA Identified Victim: NA History of harm to others?: No Assessment of Violence: None Noted Violent Behavior Description: NA Does patient have access to weapons?: No Criminal Charges Pending?: No Does patient have a court date: No Is patient on probation?: No  Psychosis Hallucinations: None noted Delusions: None noted  Mental Status Report Appearance/Hygiene: In scrubs Eye Contact: Fair Motor Activity: Unremarkable Speech: Soft, Slow Level of Consciousness: Drowsy Mood: Pleasant Affect: Appropriate to circumstance Anxiety Level: Minimal Thought Processes: Coherent, Relevant Judgement: Unimpaired Orientation: Person, Place, Time Obsessive Compulsive Thoughts/Behaviors: None  Cognitive Functioning Concentration: Good Memory: Recent Intact, Remote Intact IQ: Average Insight: Fair Impulse Control: Fair Appetite: Good Weight Loss: 0 Weight Gain: 0 Sleep: No Change Total Hours of Sleep: 6 Vegetative Symptoms: None  ADLScreening [facility] Assessment Services) Patient's cognitive ability adequate to safely complete daily activities?: Yes Patient able to express need for assistance with ADLs?: Yes Independently performs ADLs?: Yes (appropriate for developmental age)  Prior Inpatient Therapy Prior Inpatient Therapy: Yes Prior Therapy Dates: 2019,2018,2017 Prior Therapy Facilty/Provider(s): [facility], [facility], [facility] Reason for Treatment: MH issues  Prior Outpatient Therapy Prior Outpatient Therapy: Yes Prior Therapy Dates: Ongoing Prior Therapy Facilty/Provider(s): [name] Reason for Treatment: Med mang Does patient have an ACCT team?: No Does  patient have Intensive In-House Services?  : No Does patient have [name] services? : Yes Does patient have P4CC services?: No  ADL Screening (condition at time of admission) Patient's cognitive ability adequate to safely complete daily activities?: Yes Is the patient deaf or have difficulty hearing?:  No Does the patient have difficulty seeing, even when wearing glasses/contacts?: No Does the patient have difficulty concentrating, remembering, or making decisions?: No Patient able to express need for assistance with ADLs?: Yes Does the patient have difficulty dressing or bathing?: No Independently performs ADLs?: Yes (appropriate for developmental age) Does the patient have difficulty walking or climbing stairs?: No Weakness of Legs: None Weakness of Arms/Hands: None  Home Assistive Devices/Equipment Home Assistive Devices/Equipment: None  Therapy Consults (therapy consults require a physician order) PT Evaluation Needed: No OT Evalulation Needed: No SLP Evaluation Needed: No Abuse/Neglect Assessment (Assessment to be complete while patient is alone) Physical Abuse: Denies Verbal Abuse: Denies Sexual Abuse: Denies Exploitation of patient/patient's resources: Denies Self-Neglect: Denies Values / Beliefs Cultural Requests During Hospitalization: None Spiritual Requests During Hospitalization: None Consults Spiritual Care Consult Needed: No Social Work Consult Needed: No Regulatory affairs officer (For Healthcare) Does Patient Have a Medical Advance Directive?: No Would patient like information on creating a medical advance directive?: No - Patient declined    Additional Information 1:1 In Past 12 Months?: No CIRT Risk: No Elopement Risk: No Does patient have medical clearance?: No     Disposition: Per admission notes this date, patient contacted 911 reporting he was hearing voices (non-command) and was seen at [facility] less than 24 hrs ago presenting with discharge papers from 11/1316. Dwyane Dee MD, Romilda Garret FNP recommended patient be discharged and encouraged to follow up with OP resources provided on discharge.  Disposition Initial Assessment Completed for this Encounter: Yes Disposition of Patient: Other dispositions Type of inpatient treatment program: Adult Other disposition(s):  Other (Comment)(Pt to be discharged this date)  On Site Evaluation by:   Reviewed with Physician:    Mamie Nick 12/01/2017 10:39 AM

## 2017-12-01 NOTE — Progress Notes (Signed)
Patient ID: [name], male   DOB: 07/14/1961, 57 y.o.   MRN: [id number]  Pt was seen and chart reviewed with Dr Dwyane Dee. Pt returned to the [facility] after being discharged on 11-30-17. Today,  Pt denies suicidal/homicidal ideation, denies auditory/visual hallucinations and does not appear to be responding to internal stimuli. Pt is well known to local ED's with his history of schizoaffective disorder and frequent somatic complaints.Pt stated he just felt sick so he returned to the ED.  Pt is psychiatrically clear for discharge.   Ethelene Hal, NP-C 12-01-2017    [phone]

## 2017-12-01 NOTE — ED Triage Notes (Signed)
Pt called 911  D/t hearing voices non-command hallucinations Seen at [facility]  Less than 24 hrs ago for same discharge papers brought by pt. Michela Pitcher was medicated 1 day ago and voices have returned. Pt from [facility] and uncertain if he is homeless .

## 2017-12-01 NOTE — Discharge Instructions (Signed)
For your mental health needs, you are advised to continue treatment with [name]: ° °     [name] °     [address] °     [place], [place] [address] °     [phone] °

## 2017-12-01 NOTE — ED Provider Notes (Signed)
[name] DEPT Provider Note   CSN: [id number] Arrival date & time: 12/01/17  0605     History   Chief Complaint Chief Complaint  Patient presents with  . Medical Clearance    AV hallucinations hearing voices x2 years    HPI [name] is a 57 y.o. male with a history of hypertension, diabetes, CVA, substance abuse, medical noncompliance who presents the emergency department today for medical clearance.  Patient was seen here yesterday, 11/30/2017 for homicidal ideation, suicidal ideation, auditory hallucinations and discharged after behavioral health consult with outpatient resources.  He denies following up or taking any of his discharge medications.  He returns today because he states that he has been having worsening auditory hallucinations.  However the patient states that he does not feel comfortable telling others what they are saying.  He also endorses visual hallucinations but does not want to specify what this is.  He denies any homicidal ideation.  He admits to suicidal ideation.  He says if he was to attempt he would overdose on medications.  Patient reports chronic pain over the last 2 years but no specific pain in his chest or abdomen.  He has any fever, chills, shortness of breath, nausea/vomiting/diarrhea, fall, head trauma.  He admits to drinking alcohol this morning but cannot specify how much.  Patient admits to using cocaine recently but will not tell me when.   HPI  Past Medical History:  Diagnosis Date  . Alcohol abuse   . Arthritis   . Crack cocaine use   . Depression   . Diabetes mellitus   . Drug abuse ([place])   . DTs (delirium tremens) ([place])    history of   . Gout   . Noncompliance with medication regimen   . Schizophrenia ([place])   . Stroke ([place])   . Uncontrolled hypertension     Patient Active Problem List   Diagnosis Date Noted  . Hypokalemia   . Schizoaffective disorder, bipolar type ([place]) 11/22/2017  .  Trauma   . Tachycardia 03/03/2017  . Alcohol abuse   . Cocaine abuse ([place])   . Hypertensive urgency   . Tachycardia   . Cerebrovascular disease 09/02/2015  . 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 Malignant hypertension 08/30/2015  . Alcohol use disorder, severe, dependence ([place]) 08/30/2015  . Alcohol withdrawal ([place]) 08/30/2015  . Alcohol abuse with alcohol-induced mood disorder ([place]) 08/30/2015  . Thrombocytopenia ([place]) 08/27/2015  . Tobacco use disorder 08/26/2015  . Hyperlipidemia 04/28/2015  . Schizoaffective disorder, depressive type ([place])   . Gout 08/09/2013  . Diabetes mellitus ([place]) 07/09/2012    Past Surgical History:  Procedure Laterality Date  . MANDIBLE RECONSTRUCTION    . TONSILLECTOMY         Home Medications    Prior to Admission medications   Medication Sig Start Date End Date Taking? Authorizing Provider  amLODipine (NORVASC) 10 MG tablet Take 1 tablet (10 mg total) by mouth daily. 11/28/17   Barton Dubois, MD  cloNIDine (CATAPRES - DOSED IN MG/24 HR) 0.3 mg/24hr patch Place 1 patch (0.3 mg total) onto the skin once a week. 11/28/17   Barton Dubois, MD  DULoxetine (CYMBALTA) 30 MG capsule Take 1 capsule (30 mg total) by mouth 2 (two) times daily. 11/28/17   Barton Dubois, MD  folic acid (FOLVITE) 1 MG tablet Take 1 tablet (1 mg total) by mouth daily. 11/29/17   Barton Dubois, MD  gabapentin (NEURONTIN) 300 MG capsule  Take 1 capsule (300 mg total) by mouth 3 (three) times daily. 11/30/17   Patrecia Pour, NP  hydrALAZINE (APRESOLINE) 50 MG tablet Take 1 tablet (50 mg total) by mouth 3 (three) times daily. 11/28/17   Barton Dubois, MD  magnesium oxide (MAG-OX) 400 (241.3 Mg) MG tablet Take 1 tablet (400 mg total) by mouth daily. 11/29/17   Barton Dubois, MD  metFORMIN (GLUCOPHAGE) 500 MG tablet Take 1 tablet (500 mg total) by mouth 2 (two) times daily with a meal. 11/28/17   Barton Dubois, MD  paliperidone  (INVEGA SUSTENNA) 156 MG/ML SUSP injection Inject 156 mg into the muscle every 30 (thirty) days.    [provider]  thiamine 100 MG tablet Take 1 tablet (100 mg total) by mouth daily. 11/29/17   Barton Dubois, MD    Family History Family History  Problem Relation Age of Onset  . Hypertension Unknown     Social History Social History   Tobacco Use  . Smoking status: Current Every Day Smoker    Packs/day: 0.25    Types: Cigarettes  . Smokeless tobacco: Never Used  Substance Use Topics  . Alcohol use: Yes    Alcohol/week: 0.6 - 1.2 oz    Types: 1 - 2 Cans of beer per week    Comment: [facility]  . Drug use: Yes    Types: "Crack" cocaine, Cocaine     Allergies   Patient has no known allergies.   Review of Systems Review of Systems  All other systems reviewed and are negative.    Physical Exam Updated Vital Signs BP (!) 155/103 (BP Location: Left Arm)   Pulse 89   Temp 97.6 F (36.4 C) (Oral)   Resp 18   SpO2 98%   Physical Exam  Constitutional: He appears well-developed and well-nourished.  HENT:  Head: Normocephalic and atraumatic.  Right Ear: External ear normal.  Left Ear: External ear normal.  Nose: Nose normal.  Mouth/Throat: Uvula is midline, oropharynx is clear and moist and mucous membranes are normal. No tonsillar exudate.  Eyes: Pupils are equal, round, and reactive to light. Right eye exhibits no discharge. Left eye exhibits no discharge. No scleral icterus.  Neck: Trachea normal. Neck supple. No spinous process tenderness present. No neck rigidity. Normal range of motion present.  Cardiovascular: Normal rate, regular rhythm and intact distal pulses.  No murmur heard. Pulses:      Radial pulses are 2+ on the right side, and 2+ on the left side.       Dorsalis pedis pulses are 2+ on the right side, and 2+ on the left side.       Posterior tibial pulses are 2+ on the right side, and 2+ on the left side.  No lower extremity swelling or  edema. Calves symmetric in size bilaterally.  Pulmonary/Chest: Effort normal and breath sounds normal. He exhibits no tenderness.  Abdominal: Soft. Bowel sounds are normal. There is no tenderness. There is no rebound and no guarding.  Musculoskeletal: He exhibits no edema.  Lymphadenopathy:    He has no cervical adenopathy.  Neurological: He is alert.  Skin: Skin is warm and dry. No rash noted. He is not diaphoretic.  Psychiatric: He has a normal mood and affect.  Nursing note and vitals reviewed.    ED Treatments / Results  Labs (all labs ordered are listed, but only abnormal results are displayed) Labs Reviewed  COMPREHENSIVE METABOLIC PANEL - Abnormal; Notable for the following components:  Result Value   Potassium 3.3 (*)    Glucose, Bld 133 (*)    AST 139 (*)    All other components within normal limits  ETHANOL - Abnormal; Notable for the following components:   Alcohol, Ethyl (B) 87 (*)    All other components within normal limits  ACETAMINOPHEN LEVEL - Abnormal; Notable for the following components:   Acetaminophen (Tylenol), Serum <10 (*)    All other components within normal limits  CBC WITH DIFFERENTIAL/PLATELET  SALICYLATE LEVEL    EKG  EKG Interpretation  Date/Time:  Monday December 01 2017 06:42:22 EST Ventricular Rate:  85 PR Interval:  172 QRS Duration: 106 QT Interval:  388 QTC Calculation: 461 R Axis:   -20 Text Interpretation:  Normal sinus rhythm Right atrial enlargement Minimal voltage criteria for LVH, may be normal variant T wave abnormality, consider lateral ischemia Prolonged QT No significant change since last tracing 24 Nov 2017 Confirmed by Rolland Porter [phone]) on 12/01/2017 7:12:35 AM       Radiology Dg Chest 2 View  Result Date: 12/01/2017 CLINICAL DATA:  Cough for several months. Smoker. Alcohol use disorder. EXAM: CHEST  2 VIEW COMPARISON:  11/26/2017 FINDINGS: The heart size and mediastinal contours are within normal limits. Both  lungs are clear. The visualized skeletal structures are unremarkable. IMPRESSION: No active cardiopulmonary disease. Electronically Signed   By: Earle Gell M.D.   On: 12/01/2017 08:24    Procedures Procedures (including critical care time)  Medications Ordered in ED Medications  potassium chloride SA (K-DUR,KLOR-CON) CR tablet 20 mEq (20 mEq Oral Given 12/01/17 0844)     Initial Impression / Assessment and Plan / ED Course  I have reviewed the triage vital signs and the nursing notes.  Pertinent labs & imaging results that were available during my care of the patient were reviewed by me and considered in my medical decision making (see chart for details).     57 year old male presenting here for medical clearance.  Patient was seen here yesterday for reported HI and SI as well as auditory and visual hallucinations.  He was discharged home on medications with outpatient follow-up.  He has not done this.  He presenting today because of continued symptoms.  He endorses auditory and visual hallucinations but would not specify what these are.  He denies any homicidal ideation.  He admits to suicidal ideation and says he would overdose of medication if he did.  He denies any physical complaints at this time.  Patient admits to drinking up this morning but will not specify how much.  He does not appear to be in withdrawal currently. Patient seen by TTS and discharged.    Final Clinical Impressions(s) / ED Diagnoses   Final diagnoses:  Encounter for medical clearance for patient hold    ED Discharge Orders    None       Lorelle Gibbs 12/01/17 1543    Virgel Manifold, MD 12/01/17 [phone]

## 2017-12-01 NOTE — BH Assessment (Signed)
[place] Assessment Progress Note  Per [place] Abbot, MD, this pt does not require psychiatric hospitalization at this time.  Pt is to be discharged from [facility] with recommendation to continue treatment with [facility].  This has been included in pt's discharge instructions.  [name] nurse, Lala Lund, has been notified.  Jalene Mullet, [place] Triage Specialist [phone]
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
## 2017-12-20 ENCOUNTER — Encounter (HOSPITAL_COMMUNITY): Payer: Self-pay | Admitting: Emergency Medicine

## 2017-12-20 ENCOUNTER — Emergency Department (HOSPITAL_COMMUNITY)
Admission: EM | Admit: 2017-12-20 | Discharge: 2017-12-21 | Disposition: A | Discharge status: Home / Self Care | Payer: Medicaid Other | Attending: Emergency Medicine | Admitting: Emergency Medicine

## 2017-12-20 ENCOUNTER — Emergency Department (HOSPITAL_COMMUNITY)
Admission: EM | Admit: 2017-12-20 | Discharge: 2017-12-20 | Discharge status: Against Medical Advice | Payer: Medicaid Other | Source: Home / Self Care

## 2017-12-20 DIAGNOSIS — Z590 Homelessness: Secondary | ICD-10-CM | POA: Insufficient documentation

## 2017-12-20 DIAGNOSIS — Z79899 Other long term (current) drug therapy: Secondary | ICD-10-CM | POA: Insufficient documentation

## 2017-12-20 DIAGNOSIS — Z9114 Patient's other noncompliance with medication regimen: Secondary | ICD-10-CM | POA: Diagnosis not present

## 2017-12-20 DIAGNOSIS — Z7984 Long term (current) use of oral hypoglycemic drugs: Secondary | ICD-10-CM | POA: Insufficient documentation

## 2017-12-20 DIAGNOSIS — R45851 Suicidal ideations: Secondary | ICD-10-CM | POA: Diagnosis not present

## 2017-12-20 DIAGNOSIS — F250 Schizoaffective disorder, bipolar type: Secondary | ICD-10-CM | POA: Diagnosis not present

## 2017-12-20 DIAGNOSIS — F329 Major depressive disorder, single episode, unspecified: Secondary | ICD-10-CM | POA: Diagnosis present

## 2017-12-20 DIAGNOSIS — R45850 Homicidal ideations: Secondary | ICD-10-CM | POA: Diagnosis not present

## 2017-12-20 DIAGNOSIS — F1494 Cocaine use, unspecified with cocaine-induced mood disorder: Secondary | ICD-10-CM | POA: Diagnosis not present

## 2017-12-20 DIAGNOSIS — F17210 Nicotine dependence, cigarettes, uncomplicated: Secondary | ICD-10-CM | POA: Insufficient documentation

## 2017-12-20 DIAGNOSIS — I10 Essential (primary) hypertension: Secondary | ICD-10-CM | POA: Insufficient documentation

## 2017-12-20 DIAGNOSIS — R440 Auditory hallucinations: Secondary | ICD-10-CM | POA: Diagnosis not present

## 2017-12-20 DIAGNOSIS — Z046 Encounter for general psychiatric examination, requested by authority: Secondary | ICD-10-CM | POA: Diagnosis not present

## 2017-12-20 DIAGNOSIS — F1410 Cocaine abuse, uncomplicated: Secondary | ICD-10-CM | POA: Diagnosis present

## 2017-12-20 DIAGNOSIS — E119 Type 2 diabetes mellitus without complications: Secondary | ICD-10-CM | POA: Insufficient documentation

## 2017-12-20 DIAGNOSIS — F1414 Cocaine abuse with cocaine-induced mood disorder: Secondary | ICD-10-CM

## 2017-12-20 LAB — ACETAMINOPHEN LEVEL

## 2017-12-20 LAB — CBC
HEMATOCRIT: 42.9 % (ref 39.0–52.0)
Hemoglobin: 14.9 g/dL (ref 13.0–17.0)
MCH: 31 pg (ref 26.0–34.0)
MCHC: 34.7 g/dL (ref 30.0–36.0)
MCV: 89.2 fL (ref 78.0–100.0)
Platelets: 162 10*3/uL (ref 150–400)
RBC: 4.81 MIL/uL (ref 4.22–5.81)
RDW: 12.8 % (ref 11.5–15.5)
WBC: 8.1 10*3/uL (ref 4.0–10.5)

## 2017-12-20 LAB — COMPREHENSIVE METABOLIC PANEL
ALK PHOS: 87 U/L (ref 38–126)
ALT: 140 U/L — ABNORMAL HIGH (ref 17–63)
ANION GAP: 13 (ref 5–15)
AST: 260 U/L — ABNORMAL HIGH (ref 15–41)
Albumin: 4 g/dL (ref 3.5–5.0)
BILIRUBIN TOTAL: 0.9 mg/dL (ref 0.3–1.2)
BUN: 12 mg/dL (ref 6–20)
CALCIUM: 9.5 mg/dL (ref 8.9–10.3)
CO2: 24 mmol/L (ref 22–32)
Chloride: 99 mmol/L — ABNORMAL LOW (ref 101–111)
Creatinine, Ser: 1.15 mg/dL (ref 0.61–1.24)
GFR calc non Af Amer: >60 mL/min (ref >60)
GLUCOSE: 294 mg/dL — AB (ref 65–99)
Potassium: 3.6 mmol/L (ref 3.5–5.1)
Sodium: 136 mmol/L (ref 135–145)
TOTAL PROTEIN: 8.4 g/dL — AB (ref 6.5–8.1)

## 2017-12-20 LAB — SALICYLATE LEVEL: Salicylate Lvl: <7 mg/dL (ref 2.8–30.0)

## 2017-12-20 LAB — ETHANOL: Alcohol, Ethyl (B): 98 mg/dL — ABNORMAL HIGH (ref <10)

## 2017-12-20 MED ORDER — VITAMIN B-1 100 MG PO TABS
100.0000 mg | ORAL_TABLET | Freq: Every day | ORAL | Status: DC
  Administered 2017-12-20 – 2017-12-21 (×2): 100 mg via ORAL
  Filled 2017-12-20 (×2): qty 1

## 2017-12-20 MED ORDER — ADULT MULTIVITAMIN W/MINERALS CH
1.0000 | ORAL_TABLET | Freq: Every day | ORAL | Status: DC
  Administered 2017-12-20 – 2017-12-21 (×2): 1 via ORAL
  Filled 2017-12-20 (×2): qty 1

## 2017-12-20 MED ORDER — HYDROXYZINE HCL 25 MG PO TABS: 25.0000 mg | ORAL_TABLET | Freq: Four times a day (QID) | ORAL | Status: DC | PRN

## 2017-12-20 MED ORDER — LORAZEPAM 1 MG PO TABS
1.0000 mg | ORAL_TABLET | Freq: Four times a day (QID) | ORAL | Status: DC
  Administered 2017-12-20 – 2017-12-21 (×3): 1 mg via ORAL
  Filled 2017-12-20 (×3): qty 1

## 2017-12-20 MED ORDER — ONDANSETRON 4 MG PO TBDP: 4.0000 mg | ORAL_TABLET | Freq: Four times a day (QID) | ORAL | Status: DC | PRN

## 2017-12-20 MED ORDER — LORAZEPAM 1 MG PO TABS: 1.0000 mg | ORAL_TABLET | Freq: Every day | ORAL | Status: DC

## 2017-12-20 MED ORDER — CLONIDINE HCL 0.3 MG/24HR TD PTWK
0.3000 mg | MEDICATED_PATCH | TRANSDERMAL | Status: DC
  Administered 2017-12-20: 0.3 mg via TRANSDERMAL
  Filled 2017-12-20: qty 1

## 2017-12-20 MED ORDER — LORAZEPAM 1 MG PO TABS
1.0000 mg | ORAL_TABLET | Freq: Four times a day (QID) | ORAL | Status: DC | PRN
  Administered 2017-12-20: 1 mg via ORAL
  Filled 2017-12-20: qty 1

## 2017-12-20 MED ORDER — LORAZEPAM 1 MG PO TABS: 1.0000 mg | ORAL_TABLET | Freq: Two times a day (BID) | ORAL | Status: DC

## 2017-12-20 MED ORDER — FOLIC ACID 1 MG PO TABS
1.0000 mg | ORAL_TABLET | Freq: Every day | ORAL | Status: DC
  Administered 2017-12-20 – 2017-12-21 (×2): 1 mg via ORAL
  Filled 2017-12-20 (×2): qty 1

## 2017-12-20 MED ORDER — METFORMIN HCL 500 MG PO TABS
500.0000 mg | ORAL_TABLET | Freq: Two times a day (BID) | ORAL | Status: DC
  Administered 2017-12-20 – 2017-12-21 (×2): 500 mg via ORAL
  Filled 2017-12-20 (×2): qty 1

## 2017-12-20 MED ORDER — LORAZEPAM 1 MG PO TABS: 1.0000 mg | ORAL_TABLET | Freq: Three times a day (TID) | ORAL | Status: DC

## 2017-12-20 MED ORDER — THIAMINE HCL 100 MG/ML IJ SOLN: 100.0000 mg | Freq: Once | INTRAMUSCULAR | Status: DC

## 2017-12-20 MED ORDER — AMLODIPINE BESYLATE 5 MG PO TABS
10.0000 mg | ORAL_TABLET | Freq: Every day | ORAL | Status: DC
  Administered 2017-12-20 – 2017-12-21 (×2): 10 mg via ORAL
  Filled 2017-12-20 (×2): qty 2

## 2017-12-20 MED ORDER — DULOXETINE HCL 30 MG PO CPEP
30.0000 mg | ORAL_CAPSULE | Freq: Two times a day (BID) | ORAL | Status: DC
  Administered 2017-12-20 – 2017-12-21 (×3): 30 mg via ORAL
  Filled 2017-12-20 (×3): qty 1

## 2017-12-20 MED ORDER — MAGNESIUM OXIDE 400 (241.3 MG) MG PO TABS
400.0000 mg | ORAL_TABLET | Freq: Every day | ORAL | Status: DC
  Administered 2017-12-20 – 2017-12-21 (×2): 400 mg via ORAL
  Filled 2017-12-20 (×2): qty 1

## 2017-12-20 MED ORDER — CLONIDINE HCL 0.1 MG PO TABS
0.2000 mg | ORAL_TABLET | Freq: Once | ORAL | Status: AC
  Administered 2017-12-20: 0.2 mg via ORAL
  Filled 2017-12-20: qty 2

## 2017-12-20 MED ORDER — HYDRALAZINE HCL 50 MG PO TABS
50.0000 mg | ORAL_TABLET | Freq: Three times a day (TID) | ORAL | Status: DC
  Administered 2017-12-20 – 2017-12-21 (×3): 50 mg via ORAL
  Filled 2017-12-20 (×4): qty 1

## 2017-12-20 MED ORDER — LOPERAMIDE HCL 2 MG PO CAPS: 2.0000 mg | ORAL_CAPSULE | ORAL | Status: DC | PRN

## 2017-12-20 MED ORDER — GABAPENTIN 300 MG PO CAPS
300.0000 mg | ORAL_CAPSULE | Freq: Three times a day (TID) | ORAL | Status: DC
  Administered 2017-12-20 – 2017-12-21 (×3): 300 mg via ORAL
  Filled 2017-12-20 (×3): qty 1

## 2017-12-20 MED ORDER — VITAMIN B-1 100 MG PO TABS: 100.0000 mg | ORAL_TABLET | Freq: Every day | ORAL | Status: DC

## 2017-12-20 NOTE — BH Assessment (Signed)
[place] Assessment Progress Note  Case was staffed with parks FNP who recommended patient be monitored and observed. Patient will be seen by psychiatry in the a.m.

## 2017-12-20 NOTE — Patient Outreach (Signed)
CPSS met with the patient and provided substance use recovery support. Patient is interested in longer term inpatient treatment for substance use. CPSS talked to the patient about [facility] and how they would fit best for what he wants in treatment. [facility] currently does not take admissions on the weekends, so CPSS provided other inpatient/outpatient substance use treatment resources. CPSS also provided a NA meeting list for the [place] area and CPSS contact information. CPSS encouraged the patient to contact CPSS at anytime for substance use recovery support or help with substance use recovery resources.
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
## 2017-12-20 NOTE — ED Notes (Signed)
3 bags of belongings

## 2017-12-20 NOTE — ED Notes (Signed)
Called for triage room

## 2017-12-20 NOTE — ED Notes (Signed)
Hourly rounding reveals patient sleeping in room. No complaints, stable, in no acute distress. Q15 minute rounds and monitoring via Security Cameras to continue. 

## 2017-12-20 NOTE — ED Notes (Signed)
Called three times for triage room
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
## 2017-12-20 NOTE — ED Notes (Signed)
Pt is alert and voicing no complaints.

## 2017-12-20 NOTE — ED Notes (Signed)
Bed: [facility] Expected date:  Expected time:  Means of arrival:  Comments: Hold for triage 3

## 2017-12-20 NOTE — ED Notes (Signed)
Report to include Situation, Background, Assessment, and Recommendations received from [place]. Patient alert and oriented, warm and dry, in no acute distress. Patient denies SI, HI, and pain. Patient says he hears voices without command, and sees "stuff". Patient made aware of Q15 minute rounds and security cameras for their safety. Patient instructed to come to me with needs or concerns.
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
## 2017-12-21 DIAGNOSIS — F1494 Cocaine use, unspecified with cocaine-induced mood disorder: Secondary | ICD-10-CM | POA: Diagnosis not present

## 2017-12-21 DIAGNOSIS — F17210 Nicotine dependence, cigarettes, uncomplicated: Secondary | ICD-10-CM

## 2017-12-21 DIAGNOSIS — R45851 Suicidal ideations: Secondary | ICD-10-CM | POA: Diagnosis not present

## 2017-12-21 DIAGNOSIS — R440 Auditory hallucinations: Secondary | ICD-10-CM | POA: Diagnosis not present

## 2017-12-21 DIAGNOSIS — F1099 Alcohol use, unspecified with unspecified alcohol-induced disorder: Secondary | ICD-10-CM | POA: Diagnosis not present

## 2017-12-21 LAB — RAPID URINE DRUG SCREEN, HOSP PERFORMED
Amphetamines: NOT DETECTED
BENZODIAZEPINES: POSITIVE — AB
Barbiturates: NOT DETECTED
COCAINE: POSITIVE — AB
OPIATES: NOT DETECTED
Tetrahydrocannabinol: NOT DETECTED

## 2017-12-21 NOTE — ED Notes (Signed)
Hourly rounding reveals patient in room. No complaints, stable, in no acute distress. Q15 minute rounds and monitoring via Security Cam[employer] to continue. 

## 2017-12-21 NOTE — ED Notes (Signed)
Hourly rounding reveals patient sleeping in room. No complaints, stable, in no acute distress. Q15 minute rounds and monitoring via Security Cameras to continue. 

## 2017-12-21 NOTE — Consult Note (Addendum)
[facility] Psychiatry Consult   Reason for Consult:  Auditory hallucinations Referring Physician:  EDP Patient Identification: [name] MRN:  [id number] Principal Diagnosis: Cocaine-induced mood disorder [facility]) Diagnosis:   Patient Active Problem List   Diagnosis Date Noted  . Hypokalemia [E87.6]   . Schizoaffective disorder, bipolar type ([place]) [F25.0] 11/22/2017  . Trauma [T14.90XA]   . Tachycardia [R00.0] 03/03/2017  . Alcohol abuse [F10.10]   . Cocaine abuse ([place]) [F14.10]   . Hypertensive urgency [I16.0]   . Tachycardia [R00.0]   . Cocaine-induced mood disorder ([place]) [F14.94] 12/05/2016  . Cerebrovascular disease [I67.9] 09/02/2015  . 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 Malignant hypertension [I10] 08/30/2015  . Alcohol use disorder, severe, dependence ([place]) [F10.20] 08/30/2015  . Alcohol withdrawal ([place]) [F10.239] 08/30/2015  . Alcohol abuse with alcohol-induced mood disorder ([place]) [F10.14] 08/30/2015  . Thrombocytopenia ([place]) [D69.6] 08/27/2015  . Tobacco use disorder [F17.200] 08/26/2015  . Hyperlipidemia [E78.5] 04/28/2015  . Schizoaffective disorder, depressive type ([place]) [F25.1]   . Gout [M10.9] 08/09/2013  . Diabetes mellitus ([place]) [E11.9] 07/09/2012    Total Time spent with patient: 45 minutes  Subjective:   [name] is a 57 y.o. male patient admitted with auditory hallucinations while using cocaine.  HPI:  Pt was seen and chart reviewed with treatment team and Dr Darleene Cleaver. Pt is well known to this hospital system and frequently presents to the emergency room with the complaint of hearing voices and having suicidal thoughts. Pt has a long history of cocaine and alcohol abuse, UDS positive for cocaine, BAL 98. Pt is non compliant with his daily medications and has had 9 emergency room visits and 3 admissions since 10, 2018. Pt will come into the emergency room with somatic  complaint and then become suicidal but has no plan. Pt is homeless and has been hearing voices for over 20 years. Pt stated he wants a long term treatment program for his substance abuse. Pt was seen by Peer Support and given substance abuse resources.  Pt appears to be at his baseline ans is psychiatrically clear for discharge.   Past Psychiatric History: As above  Risk to Self: None Risk to Others:None Prior Inpatient Therapy: Prior Inpatient Therapy: Yes Prior Therapy Dates: 2019,2018, 2017 Prior Therapy Facilty/Provider(s): [facility], [facility] Reason for Treatment: MH issues Prior Outpatient Therapy: Prior Outpatient Therapy: Yes Prior Therapy Dates: 2018 Prior Therapy Facilty/Provider(s): [name] Reason for Treatment: Med mang Does patient have an ACCT team?: No Does patient have Intensive In-House Services?  : No Does patient have [name] services? : Yes Does patient have P4CC services?: No  Past Medical History:  Past Medical History:  Diagnosis Date  . Alcohol abuse   . Arthritis   . Crack cocaine use   . Depression   . Diabetes mellitus   . Drug abuse ([place])   . DTs (delirium tremens) ([place])    history of   . Gout   . Noncompliance with medication regimen   . Schizophrenia ([place])   . Stroke ([place])   . Uncontrolled hypertension     Past Surgical History:  Procedure Laterality Date  . MANDIBLE RECONSTRUCTION    . TONSILLECTOMY     Family History:  Family History  Problem Relation Age of Onset  . Hypertension Unknown    Family Psychiatric  History: Unknown Social History:  Social History   Substance and Sexual Activity  Alcohol Use Yes  . Alcohol/week: 0.6 - 1.2 oz  . Types: 1 -  2 Cans of beer per week   Comment: [facility]     Social History   Substance and Sexual Activity  Drug Use Yes  . Types: "Crack" cocaine, Cocaine    Social History   Socioeconomic History  . Marital status: Legally Separated    Spouse name: None  . Number of children: None  .  Years of education: None  . Highest education level: None  Social Needs  . Financial resource strain: None  . Food insecurity - worry: None  . Food insecurity - inability: None  . Transportation needs - medical: None  . Transportation needs - non-medical: None  Occupational History  . None  Tobacco Use  . Smoking status: Current Every Day Smoker    Packs/day: 0.25    Types: Cigarettes  . Smokeless tobacco: Never Used  Substance and Sexual Activity  . Alcohol use: Yes    Alcohol/week: 0.6 - 1.2 oz    Types: 1 - 2 Cans of beer per week    Comment: [facility]  . Drug use: Yes    Types: "Crack" cocaine, Cocaine  . Sexual activity: None  Other Topics Concern  . None  Social History Narrative  . None   Additional Social History:    Allergies:  No Known Allergies  Labs:  Results for orders placed or performed during the hospital encounter of 12/20/17 (from the past 48 hour(s))  Rapid urine drug screen (hospital performed)     Status: Abnormal   Collection Time: 12/20/17  9:00 AM  Result Value Ref Range   Opiates NONE DETECTED NONE DETECTED   Cocaine POSITIVE (A) NONE DETECTED   Benzodiazepines POSITIVE (A) NONE DETECTED   Amphetamines NONE DETECTED NONE DETECTED   Tetrahydrocannabinol NONE DETECTED NONE DETECTED   Barbiturates NONE DETECTED NONE DETECTED    Comment: (NOTE) DRUG SCREEN FOR MEDICAL PURPOSES ONLY.  IF CONFIRMATION IS NEEDED FOR ANY PURPOSE, NOTIFY LAB WITHIN 5 DAYS. LOWEST DETECTABLE LIMITS FOR URINE DRUG SCREEN Drug Class                     Cutoff (ng/mL) Amphetamine and metabolites    1000 Barbiturate and metabolites    200 Benzodiazepine                 664 Tricyclics and metabolites     300 Opiates and metabolites        300 Cocaine and metabolites        300 THC                            50 Performed at [facility], [place] [address], [place], [place] [address]   Comprehensive metabolic panel     Status: Abnormal    Collection Time: 12/20/17  1:57 PM  Result Value Ref Range   Sodium 136 135 - 145 mmol/L   Potassium 3.6 3.5 - 5.1 mmol/L   Chloride 99 (L) 101 - 111 mmol/L   CO2 24 22 - 32 mmol/L   Glucose, Bld 294 (H) 65 - 99 mg/dL   BUN 12 6 - 20 mg/dL   Creatinine, Ser 1.15 0.61 - 1.24 mg/dL   Calcium 9.5 8.9 - 10.3 mg/dL   Total Protein 8.4 (H) 6.5 - 8.1 g/dL   Albumin 4.0 3.5 - 5.0 g/dL   AST 260 (H) 15 - 41 U/L   ALT 140 (H) 17 - 63 U/L  Alkaline Phosphatase 87 38 - 126 U/L   Total Bilirubin 0.9 0.3 - 1.2 mg/dL   GFR calc non Af Amer >60 >60 mL/min   GFR calc Af Amer >60 >60 mL/min    Comment: (NOTE) The eGFR has been calculated using the CKD EPI equation. This calculation has not been validated in all clinical situations. eGFR's persistently <60 mL/min signify possible Chronic Kidney Disease.    Anion gap 13 5 - 15    Comment: Performed at [facility], [place] [address], [place], [place] [address]  Ethanol     Status: Abnormal   Collection Time: 12/20/17  1:57 PM  Result Value Ref Range   Alcohol, Ethyl (B) 98 (H) <10 mg/dL    Comment:        LOWEST DETECTABLE LIMIT FOR SERUM ALCOHOL IS 10 mg/dL FOR MEDICAL PURPOSES ONLY Performed at [facility] [address] [place], [place] [address]   Salicylate level     Status: None   Collection Time: 12/20/17  1:57 PM  Result Value Ref Range   Salicylate Lvl <7.1 2.8 - 30.0 mg/dL    Comment: Performed at [facility], [place] [address] [place], [place] [address]  Acetaminophen level     Status: Abnormal   Collection Time: 12/20/17  1:57 PM  Result Value Ref Range   Acetaminophen (Tylenol), Serum <10 (L) 10 - 30 ug/mL    Comment:        THERAPEUTIC CONCENTRATIONS VARY SIGNIFICANTLY. A RANGE OF 10-30 ug/mL MAY BE AN EFFECTIVE CONCENTRATION FOR MANY PATIENTS. HOWEVER, SOME ARE BEST TREATED AT CONCENTRATIONS OUTSIDE THIS RANGE. ACETAMINOPHEN CONCENTRATIONS >150 ug/mL AT 4 HOURS  AFTER INGESTION AND >50 ug/mL AT 12 HOURS AFTER INGESTION ARE OFTEN ASSOCIATED WITH TOXIC REACTIONS. Performed at [facility], [place] [address] [place], [place] [address]   cbc     Status: None   Collection Time: 12/20/17  1:57 PM  Result Value Ref Range   WBC 8.1 4.0 - 10.5 K/uL   RBC 4.81 4.22 - 5.81 MIL/uL   Hemoglobin 14.9 13.0 - 17.0 g/dL   HCT 42.9 39.0 - 52.0 %   MCV 89.2 78.0 - 100.0 fL   MCH 31.0 26.0 - 34.0 pg   MCHC 34.7 30.0 - 36.0 g/dL   RDW 12.8 11.5 - 15.5 %   Platelets 162 150 - 400 K/uL    Comment: Performed at [facility], [place] [address] [place],  [address]    Current Facility-Administered Medications  Medication Dose Route Frequency Provider Last Rate Last Dose  . amLODipine (NORVASC) tablet 10 mg  10 mg Oral Daily Glyn Ade, PA-C   10 mg at 12/21/17 1014  . cloNIDine (CATAPRES - Dosed in mg/24 hr) patch 0.3 mg  0.3 mg Transdermal Weekly Duffy Bruce, MD   0.3 mg at 12/20/17 2001  . DULoxetine (CYMBALTA) DR capsule 30 mg  30 mg Oral BID Glyn Ade, PA-C   30 mg at 00/93/81 8299  . folic acid (FOLVITE) tablet 1 mg  1 mg Oral Daily Glyn Ade, PA-C   1 mg at 12/21/17 1014  . gabapentin (NEURONTIN) capsule 300 mg  300 mg Oral TID Glyn Ade, PA-C   300 mg at 12/21/17 1014  . hydrALAZINE (APRESOLINE) tablet 50 mg  50 mg Oral TID Glyn Ade, PA-C   50 mg at 12/21/17 1015  . hydrOXYzine (ATARAX/VISTARIL) tablet 25 mg  25 mg  Oral Q6H PRN Ethelene Hal, NP      . loperamide (IMODIUM) capsule 2-4 mg  2-4 mg Oral PRN Ethelene Hal, NP      . LORazepam (ATIVAN) tablet 1 mg  1 mg Oral Q6H PRN Ethelene Hal, NP   1 mg at 12/20/17 1715  . LORazepam (ATIVAN) tablet 1 mg  1 mg Oral QID Ethelene Hal, NP   1 mg at 12/21/17 1014   Followed by  . [START ON 12/22/2017] LORazepam (ATIVAN) tablet 1 mg  1 mg Oral TID Ethelene Hal, NP       Followed by  .  [START ON 12/23/2017] LORazepam (ATIVAN) tablet 1 mg  1 mg Oral BID Ethelene Hal, NP       Followed by  . [START ON 12/24/2017] LORazepam (ATIVAN) tablet 1 mg  1 mg Oral Daily Ethelene Hal, NP      . magnesium oxide (MAG-OX) tablet 400 mg  400 mg Oral Daily Glyn Ade, PA-C   400 mg at 12/21/17 1015  . metFORMIN (GLUCOPHAGE) tablet 500 mg  500 mg Oral BID WC Glyn Ade, PA-C   500 mg at 12/21/17 0857  . multivitamin with minerals tablet 1 tablet  1 tablet Oral Daily Ethelene Hal, NP   1 tablet at 12/21/17 1013  . ondansetron (ZOFRAN-ODT) disintegrating tablet 4 mg  4 mg Oral Q6H PRN Ethelene Hal, NP      . thiamine (B-1) injection 100 mg  100 mg Intramuscular Once Ethelene Hal, NP      . thiamine (VITAMIN B-1) tablet 100 mg  100 mg Oral Daily Glyn Ade, PA-C   100 mg at 12/21/17 1015  . thiamine (VITAMIN B-1) tablet 100 mg  100 mg Oral Daily Ethelene Hal, NP       Current Outpatient Medications  Medication Sig Dispense Refill  . amLODipine (NORVASC) 10 MG tablet Take 1 tablet (10 mg total) by mouth daily. 30 tablet 1  . cloNIDine (CATAPRES - DOSED IN MG/24 HR) 0.3 mg/24hr patch Place 1 patch (0.3 mg total) onto the skin once a week. 4 patch 1  . DULoxetine (CYMBALTA) 30 MG capsule Take 1 capsule (30 mg total) by mouth 2 (two) times daily. 60 capsule 0  . folic acid (FOLVITE) 1 MG tablet Take 1 tablet (1 mg total) by mouth daily. 30 tablet 1  . gabapentin (NEURONTIN) 300 MG capsule Take 1 capsule (300 mg total) by mouth 3 (three) times daily. 90 capsule 0  . hydrALAZINE (APRESOLINE) 50 MG tablet Take 1 tablet (50 mg total) by mouth 3 (three) times daily. 90 tablet 1  . magnesium oxide (MAG-OX) 400 (241.3 Mg) MG tablet Take 1 tablet (400 mg total) by mouth daily. 30 tablet 0  . metFORMIN (GLUCOPHAGE) 500 MG tablet Take 1 tablet (500 mg total) by mouth 2 (two) times daily with a meal. 60 tablet 1  . thiamine 100 MG tablet  Take 1 tablet (100 mg total) by mouth daily. 30 tablet 1  . paliperidone (INVEGA SUSTENNA) 156 MG/ML SUSP injection Inject 156 mg into the muscle every 30 (thirty) days.      Musculoskeletal: Strength & Muscle Tone: within normal limits Gait & Station: normal Patient leans: N/A  Psychiatric Specialty Exam: Physical Exam  Constitutional: He appears well-developed and well-nourished.  Respiratory: Effort normal.  Musculoskeletal: Normal range of motion.  Neurological: He is alert.  Psychiatric: Thought content normal. His  speech is slurred. He is slowed. Cognition and memory are impaired. He expresses impulsivity. He exhibits a depressed mood.    Review of Systems  Psychiatric/Behavioral: Positive for depression and substance abuse. Negative for hallucinations, memory loss and suicidal ideas. The patient is not nervous/anxious and does not have insomnia.   All other systems reviewed and are negative.   Blood pressure (!) 157/104, pulse 95, temperature 98.7 F (37.1 C), temperature source Oral, resp. rate 18, SpO2 99 %.There is no height or weight on file to calculate BMI.  General Appearance: Disheveled  Eye Contact:  Good  Speech:  Clear and Coherent  Volume:  Decreased  Mood:  Depressed  Affect:  Congruent and Depressed  Thought Process:  Coherent  Orientation:  Full (Time, Place, and Person)  Thought Content:  Illogical  Suicidal Thoughts:  No  Homicidal Thoughts:  No  Memory:  Immediate;   Good Recent;   Good Remote;   Fair  Judgement:  Poor  Insight:  Lacking  Psychomotor Activity:  Decreased  Concentration:  Concentration: Fair and Attention Span: Fair  Recall:  [employer] of Knowledge:  Fair  Language:  Fair  Akathisia:  No  Handed:  Right  AIMS (if indicated):     Assets:  Warehouse manager Resources/Insurance  ADL's:  Intact  Cognition:  WNL  Sleep:        Treatment Plan Summary: Plan Cocaine Induced Mood Disorder  Discharge Home  Take all  medications as prescribed Follow up with [facility] for medication management and therapy Avoid the use of alcohol and illicit drugs  Disposition: No evidence of imminent risk to self or others at present.   Patient does not meet criteria for psychiatric inpatient admission. Supportive therapy provided about ongoing stressors. Discussed crisis plan, support from social network, calling 911, coming to the Emergency Department, and calling Suicide Hotline.  Ethelene Hal, NP 12/21/2017 12:11 PM  Patient seen face-to-face for psychiatric evaluation, chart reviewed and case discussed with the physician extender and developed treatment plan. Reviewed the information documented and agree with the treatment plan. Corena Pilgrim, MD

## 2017-12-21 NOTE — ED Notes (Signed)
Pt showered after being encouraged by staff.  Pt is in no distress.  Denies S/I  And H/I.  15 minute checks and video monitoring in place.

## 2017-12-21 NOTE — [facility] Suicide Risk Assessment (Signed)
Suicide Risk Assessment  Discharge Assessment   [facility] Discharge Suicide Risk Assessment   Principal Problem: Cocaine-induced mood disorder [facility]) Discharge Diagnoses:  Patient Active Problem List   Diagnosis Date Noted  . Hypokalemia [E87.6]   . Schizoaffective disorder, bipolar type ([place]) [F25.0] 11/22/2017  . Trauma [T14.90XA]   . Tachycardia [R00.0] 03/03/2017  . Alcohol abuse [F10.10]   . Cocaine abuse ([place]) [F14.10]   . Hypertensive urgency [I16.0]   . Tachycardia [R00.0]   . Cocaine-induced mood disorder ([place]) [F14.94] 12/05/2016  . Cerebrovascular disease [I67.9] 09/02/2015  . 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 Malignant hypertension [I10] 08/30/2015  . Alcohol use disorder, severe, dependence ([place]) [F10.20] 08/30/2015  . Alcohol withdrawal ([place]) [F10.239] 08/30/2015  . Alcohol abuse with alcohol-induced mood disorder ([place]) [F10.14] 08/30/2015  . Thrombocytopenia ([place]) [D69.6] 08/27/2015  . Tobacco use disorder [F17.200] 08/26/2015  . Hyperlipidemia [E78.5] 04/28/2015  . Schizoaffective disorder, depressive type ([place]) [F25.1]   . Gout [M10.9] 08/09/2013  . Diabetes mellitus ([place]) [E11.9] 07/09/2012    Total Time spent with patient: 45 minutes  Musculoskeletal: Strength & Muscle Tone: within normal limits Gait & Station: normal Patient leans: N/A  Psychiatric Specialty Exam: Physical Exam  Constitutional: He appears well-developed and well-nourished.  Respiratory: Effort normal.  Musculoskeletal: Normal range of motion.  Neurological: He is alert.  Psychiatric: Thought content normal. His speech is slurred. He is slowed. Cognition and memory are impaired. He expresses impulsivity. He exhibits a depressed mood.   Review of Systems  Psychiatric/Behavioral: Positive for depression and substance abuse. Negative for hallucinations, memory loss and suicidal ideas. The patient is not nervous/anxious and does not  have insomnia.   All other systems reviewed and are negative.  Blood pressure (!) 160/108, pulse 97, temperature 98.3 F (36.8 C), temperature source Oral, resp. rate 18, SpO2 100 %.There is no height or weight on file to calculate BMI. General Appearance: Disheveled Eye Contact:  Good Speech:  Clear and Coherent Volume:  Decreased Mood:  Depressed Affect:  Congruent and Depressed Thought Process:  Coherent Orientation:  Full (Time, Place, and Person) Thought Content:  Illogical Suicidal Thoughts:  No Homicidal Thoughts:  No Memory:  Immediate;   Good Recent;   Good Remote;   Fair Judgement:  Poor Insight:  Lacking Psychomotor Activity:  Decreased Concentration:  Concentration: Fair and Attention Span: Fair Recall:  [employer] of Knowledge:  Fair Language:  Fair Akathisia:  No Handed:  Right AIMS (if indicated):    Assets:  Warehouse manager Resources/Insurance ADL's:  Intact Cognition:  WNL   Mental Status Per Nursing Assessment::   On Admission:   auditory hallucinations  Demographic Factors:  Male, Low socioeconomic status and Unemployed  Loss Factors: Financial problems/change in socioeconomic status  Historical Factors: Impulsivity  Risk Reduction Factors:   NA  Continued Clinical Symptoms:  Depression:   Comorbid alcohol abuse/dependence Impulsivity Alcohol/Substance Abuse/Dependencies Previous Psychiatric Diagnoses and Treatments Medical Diagnoses and Treatments/Surgeries  Cognitive Features That Contribute To Risk:  Closed-mindedness    Suicide Risk:  Minimal: No identifiable suicidal ideation.  Patients presenting with no risk factors but with morbid ruminations; may be classified as minimal risk based on the severity of the depressive symptoms    Plan Of Care/Follow-up recommendations:  Activity:  as tolerated Diet:  Heart healthy  Ethelene Hal, NP 12/21/2017, 12:11 PM

## 2017-12-21 NOTE — ED Notes (Signed)
Pt discharged safely with instructions to follow up with [name].  All belongings were returned .  Bus pass was give.

## 2017-12-21 NOTE — Discharge Instructions (Signed)
Follow up with:   [facility] Phone: [phone] Physical Address: [address] [place], [place]  Mon-Fri 8-5 walk-in

## 2018-01-31 ENCOUNTER — Emergency Department (HOSPITAL_COMMUNITY): Payer: Medicaid Other

## 2018-01-31 ENCOUNTER — Encounter (HOSPITAL_COMMUNITY): Payer: Self-pay | Admitting: Emergency Medicine

## 2018-01-31 ENCOUNTER — Inpatient Hospital Stay (HOSPITAL_COMMUNITY)
Admission: EM | Admit: 2018-01-31 | Discharge: 2018-02-04 | DRG: 286 | Disposition: A | Discharge status: Home / Self Care | Payer: Medicaid Other | Attending: Internal Medicine | Admitting: Internal Medicine

## 2018-01-31 DIAGNOSIS — F1414 Cocaine abuse with cocaine-induced mood disorder: Secondary | ICD-10-CM

## 2018-01-31 DIAGNOSIS — R079 Chest pain, unspecified: Secondary | ICD-10-CM | POA: Diagnosis present

## 2018-01-31 DIAGNOSIS — G4700 Insomnia, unspecified: Secondary | ICD-10-CM | POA: Diagnosis present

## 2018-01-31 DIAGNOSIS — I10 Essential (primary) hypertension: Secondary | ICD-10-CM | POA: Diagnosis not present

## 2018-01-31 DIAGNOSIS — F339 Major depressive disorder, recurrent, unspecified: Secondary | ICD-10-CM

## 2018-01-31 DIAGNOSIS — R45851 Suicidal ideations: Secondary | ICD-10-CM | POA: Diagnosis present

## 2018-01-31 DIAGNOSIS — I248 Other forms of acute ischemic heart disease: Secondary | ICD-10-CM | POA: Diagnosis present

## 2018-01-31 DIAGNOSIS — F1099 Alcohol use, unspecified with unspecified alcohol-induced disorder: Secondary | ICD-10-CM

## 2018-01-31 DIAGNOSIS — F259 Schizoaffective disorder, unspecified: Secondary | ICD-10-CM

## 2018-01-31 DIAGNOSIS — I5041 Acute combined systolic (congestive) and diastolic (congestive) heart failure: Secondary | ICD-10-CM | POA: Diagnosis present

## 2018-01-31 DIAGNOSIS — E119 Type 2 diabetes mellitus without complications: Secondary | ICD-10-CM | POA: Diagnosis present

## 2018-01-31 DIAGNOSIS — I160 Hypertensive urgency: Principal | ICD-10-CM | POA: Diagnosis present

## 2018-01-31 DIAGNOSIS — Y903 Blood alcohol level of 60-79 mg/100 ml: Secondary | ICD-10-CM

## 2018-01-31 DIAGNOSIS — Z79899 Other long term (current) drug therapy: Secondary | ICD-10-CM

## 2018-01-31 DIAGNOSIS — N179 Acute kidney failure, unspecified: Secondary | ICD-10-CM | POA: Diagnosis present

## 2018-01-31 DIAGNOSIS — F1490 Cocaine use, unspecified, uncomplicated: Secondary | ICD-10-CM

## 2018-01-31 DIAGNOSIS — I5021 Acute systolic (congestive) heart failure: Secondary | ICD-10-CM

## 2018-01-31 DIAGNOSIS — K2920 Alcoholic gastritis without bleeding: Secondary | ICD-10-CM | POA: Diagnosis present

## 2018-01-31 DIAGNOSIS — Z590 Homelessness: Secondary | ICD-10-CM

## 2018-01-31 DIAGNOSIS — F17210 Nicotine dependence, cigarettes, uncomplicated: Secondary | ICD-10-CM | POA: Diagnosis present

## 2018-01-31 DIAGNOSIS — R1013 Epigastric pain: Secondary | ICD-10-CM

## 2018-01-31 DIAGNOSIS — Z9114 Patient's other noncompliance with medication regimen: Secondary | ICD-10-CM

## 2018-01-31 DIAGNOSIS — I110 Hypertensive heart disease with heart failure: Secondary | ICD-10-CM | POA: Diagnosis present

## 2018-01-31 DIAGNOSIS — E876 Hypokalemia: Secondary | ICD-10-CM | POA: Diagnosis present

## 2018-01-31 DIAGNOSIS — F251 Schizoaffective disorder, depressive type: Secondary | ICD-10-CM | POA: Diagnosis present

## 2018-01-31 DIAGNOSIS — R0789 Other chest pain: Secondary | ICD-10-CM

## 2018-01-31 DIAGNOSIS — Z8249 Family history of ischemic heart disease and other diseases of the circulatory system: Secondary | ICD-10-CM

## 2018-01-31 DIAGNOSIS — Z7984 Long term (current) use of oral hypoglycemic drugs: Secondary | ICD-10-CM

## 2018-01-31 DIAGNOSIS — I43 Cardiomyopathy in diseases classified elsewhere: Secondary | ICD-10-CM | POA: Diagnosis present

## 2018-01-31 DIAGNOSIS — F1010 Alcohol abuse, uncomplicated: Secondary | ICD-10-CM | POA: Diagnosis present

## 2018-01-31 DIAGNOSIS — E785 Hyperlipidemia, unspecified: Secondary | ICD-10-CM | POA: Diagnosis present

## 2018-01-31 DIAGNOSIS — Z8673 Personal history of transient ischemic attack (TIA), and cerebral infarction without residual deficits: Secondary | ICD-10-CM

## 2018-01-31 DIAGNOSIS — F1410 Cocaine abuse, uncomplicated: Secondary | ICD-10-CM

## 2018-01-31 HISTORY — DX: Unspecified systolic (congestive) heart failure: I50.20

## 2018-01-31 HISTORY — DX: Hypertensive heart disease with heart failure: I11.0

## 2018-01-31 LAB — COMPREHENSIVE METABOLIC PANEL
ALT: 28 U/L (ref 17–63)
ANION GAP: 14 (ref 5–15)
AST: 69 U/L — ABNORMAL HIGH (ref 15–41)
Albumin: 3.2 g/dL — ABNORMAL LOW (ref 3.5–5.0)
Alkaline Phosphatase: 68 U/L (ref 38–126)
BUN: 8 mg/dL (ref 6–20)
CHLORIDE: 104 mmol/L (ref 101–111)
CO2: 21 mmol/L — ABNORMAL LOW (ref 22–32)
CREATININE: 0.92 mg/dL (ref 0.61–1.24)
Calcium: 8.8 mg/dL — ABNORMAL LOW (ref 8.9–10.3)
Glucose, Bld: 265 mg/dL — ABNORMAL HIGH (ref 65–99)
POTASSIUM: 3.5 mmol/L (ref 3.5–5.1)
SODIUM: 139 mmol/L (ref 135–145)
Total Bilirubin: 0.5 mg/dL (ref 0.3–1.2)
Total Protein: 6.8 g/dL (ref 6.5–8.1)

## 2018-01-31 LAB — CBC WITH DIFFERENTIAL/PLATELET
Basophils Absolute: 0 10*3/uL (ref 0.0–0.1)
Basophils Relative: 0 %
EOS ABS: 0.1 10*3/uL (ref 0.0–0.7)
EOS PCT: 2 %
HCT: 39.5 % (ref 39.0–52.0)
Hemoglobin: 12.8 g/dL — ABNORMAL LOW (ref 13.0–17.0)
Lymphocytes Relative: 45 %
Lymphs Abs: 2.4 10*3/uL (ref 0.7–4.0)
MCH: 30.1 pg (ref 26.0–34.0)
MCHC: 32.4 g/dL (ref 30.0–36.0)
MCV: 92.9 fL (ref 78.0–100.0)
MONO ABS: 0.3 10*3/uL (ref 0.1–1.0)
Monocytes Relative: 6 %
Neutro Abs: 2.6 10*3/uL (ref 1.7–7.7)
Neutrophils Relative %: 47 %
PLATELETS: 168 10*3/uL (ref 150–400)
RBC: 4.25 MIL/uL (ref 4.22–5.81)
RDW: 13.4 % (ref 11.5–15.5)
WBC: 5.4 10*3/uL (ref 4.0–10.5)

## 2018-01-31 LAB — URINALYSIS, ROUTINE W REFLEX MICROSCOPIC
BACTERIA UA: NONE SEEN
BILIRUBIN URINE: NEGATIVE
Glucose, UA: >=500 mg/dL — AB
Ketones, ur: NEGATIVE mg/dL
Leukocytes, UA: NEGATIVE
NITRITE: NEGATIVE
Protein, ur: 100 mg/dL — AB
SQUAMOUS EPITHELIAL / LPF: NONE SEEN
Specific Gravity, Urine: 1.006 (ref 1.005–1.030)
pH: 6 (ref 5.0–8.0)

## 2018-01-31 LAB — TROPONIN I
TROPONIN I: 0.03 ng/mL — AB (ref <0.03)
TROPONIN I: 0.04 ng/mL — AB (ref <0.03)
Troponin I: 0.03 ng/mL (ref <0.03)
Troponin I: 0.04 ng/mL (ref <0.03)

## 2018-01-31 LAB — ETHANOL: ALCOHOL ETHYL (B): 66 mg/dL — AB (ref <10)

## 2018-01-31 LAB — RAPID URINE DRUG SCREEN, HOSP PERFORMED
Amphetamines: NOT DETECTED
BENZODIAZEPINES: POSITIVE — AB
Barbiturates: NOT DETECTED
COCAINE: POSITIVE — AB
OPIATES: NOT DETECTED
Tetrahydrocannabinol: NOT DETECTED

## 2018-01-31 LAB — CBG MONITORING, ED
GLUCOSE-CAPILLARY: 202 mg/dL — AB (ref 65–99)
Glucose-Capillary: 173 mg/dL — ABNORMAL HIGH (ref 65–99)
Glucose-Capillary: 136 mg/dL — ABNORMAL HIGH (ref 65–99)

## 2018-01-31 LAB — ACETAMINOPHEN LEVEL

## 2018-01-31 LAB — SALICYLATE LEVEL: Salicylate Lvl: <7 mg/dL (ref 2.8–30.0)

## 2018-01-31 LAB — BRAIN NATRIURETIC PEPTIDE: B NATRIURETIC PEPTIDE 5: 482.3 pg/mL — AB (ref 0.0–100.0)

## 2018-01-31 LAB — GLUCOSE, CAPILLARY
GLUCOSE-CAPILLARY: 179 mg/dL — AB (ref 65–99)
Glucose-Capillary: 194 mg/dL — ABNORMAL HIGH (ref 65–99)

## 2018-01-31 LAB — PHOSPHORUS: PHOSPHORUS: 3.4 mg/dL (ref 2.5–4.6)

## 2018-01-31 MED ORDER — GABAPENTIN 300 MG PO CAPS
300.0000 mg | ORAL_CAPSULE | Freq: Three times a day (TID) | ORAL | Status: DC
  Administered 2018-01-31: 300 mg via ORAL
  Filled 2018-01-31: qty 1

## 2018-01-31 MED ORDER — ACETAMINOPHEN 325 MG PO TABS
650.0000 mg | ORAL_TABLET | Freq: Four times a day (QID) | ORAL | Status: DC | PRN
  Administered 2018-02-01 – 2018-02-04 (×6): 650 mg via ORAL
  Filled 2018-01-31 (×6): qty 2

## 2018-01-31 MED ORDER — AMLODIPINE BESYLATE 5 MG PO TABS: 10.0000 mg | ORAL_TABLET | Freq: Every day | ORAL | Status: DC

## 2018-01-31 MED ORDER — METFORMIN HCL 500 MG PO TABS
500.0000 mg | ORAL_TABLET | Freq: Two times a day (BID) | ORAL | Status: DC
  Administered 2018-01-31: 500 mg via ORAL
  Filled 2018-01-31: qty 1

## 2018-01-31 MED ORDER — ENOXAPARIN SODIUM 40 MG/0.4ML ~~LOC~~ SOLN
40.0000 mg | SUBCUTANEOUS | Status: DC
  Administered 2018-01-31 – 2018-02-03 (×4): 40 mg via SUBCUTANEOUS
  Filled 2018-01-31 (×3): qty 0.4

## 2018-01-31 MED ORDER — CLONIDINE HCL 0.3 MG/24HR TD PTWK
0.3000 mg | MEDICATED_PATCH | TRANSDERMAL | Status: DC
  Administered 2018-01-31: 0.3 mg via TRANSDERMAL
  Filled 2018-01-31 (×2): qty 1

## 2018-01-31 MED ORDER — THIAMINE HCL 100 MG/ML IJ SOLN
100.0000 mg | Freq: Every day | INTRAMUSCULAR | Status: DC
  Administered 2018-01-31 – 2018-02-01 (×2): 100 mg via INTRAVENOUS
  Filled 2018-01-31 (×2): qty 2

## 2018-01-31 MED ORDER — INSULIN ASPART 100 UNIT/ML ~~LOC~~ SOLN
0.0000 [IU] | Freq: Three times a day (TID) | SUBCUTANEOUS | Status: DC
  Administered 2018-01-31 (×2): 3 [IU] via SUBCUTANEOUS
  Administered 2018-01-31: 2 [IU] via SUBCUTANEOUS
  Administered 2018-02-01: 3 [IU] via SUBCUTANEOUS
  Administered 2018-02-01: 8 [IU] via SUBCUTANEOUS
  Administered 2018-02-01: 5 [IU] via SUBCUTANEOUS
  Administered 2018-02-02: 3 [IU] via SUBCUTANEOUS
  Administered 2018-02-02: 5 [IU] via SUBCUTANEOUS
  Administered 2018-02-02: 2 [IU] via SUBCUTANEOUS
  Administered 2018-02-03: 3 [IU] via SUBCUTANEOUS
  Administered 2018-02-03: 5 [IU] via SUBCUTANEOUS
  Administered 2018-02-04: 3 [IU] via SUBCUTANEOUS
  Filled 2018-01-31 (×2): qty 1

## 2018-01-31 MED ORDER — DULOXETINE HCL 30 MG PO CPEP
30.0000 mg | ORAL_CAPSULE | Freq: Two times a day (BID) | ORAL | Status: DC
  Administered 2018-01-31 – 2018-02-04 (×9): 30 mg via ORAL
  Filled 2018-01-31 (×10): qty 1

## 2018-01-31 MED ORDER — HYDRALAZINE HCL 20 MG/ML IJ SOLN
10.0000 mg | Freq: Once | INTRAMUSCULAR | Status: AC
  Administered 2018-01-31: 10 mg via INTRAVENOUS
  Filled 2018-01-31: qty 1

## 2018-01-31 MED ORDER — ACETAMINOPHEN 650 MG RE SUPP: 650.0000 mg | Freq: Four times a day (QID) | RECTAL | Status: DC | PRN

## 2018-01-31 MED ORDER — HYDRALAZINE HCL 10 MG PO TABS
10.0000 mg | ORAL_TABLET | Freq: Three times a day (TID) | ORAL | Status: DC | PRN
  Administered 2018-01-31: 10 mg via ORAL
  Filled 2018-01-31: qty 1

## 2018-01-31 MED ORDER — LORAZEPAM 1 MG PO TABS
1.0000 mg | ORAL_TABLET | Freq: Once | ORAL | Status: AC
  Administered 2018-01-31: 1 mg via ORAL
  Filled 2018-01-31: qty 1

## 2018-01-31 MED ORDER — HYDRALAZINE HCL 10 MG PO TABS
10.0000 mg | ORAL_TABLET | Freq: Four times a day (QID) | ORAL | Status: DC | PRN
  Administered 2018-01-31 – 2018-02-01 (×3): 10 mg via ORAL
  Filled 2018-01-31 (×3): qty 1

## 2018-01-31 MED ORDER — FOLIC ACID 5 MG/ML IJ SOLN
1.0000 mg | Freq: Every day | INTRAMUSCULAR | Status: DC
  Administered 2018-01-31 – 2018-02-01 (×2): 1 mg via INTRAVENOUS
  Filled 2018-01-31 (×2): qty 0.2

## 2018-01-31 MED ORDER — AMLODIPINE BESYLATE 10 MG PO TABS
10.0000 mg | ORAL_TABLET | Freq: Every day | ORAL | Status: DC
  Administered 2018-01-31 – 2018-02-04 (×5): 10 mg via ORAL
  Filled 2018-01-31: qty 2
  Filled 2018-01-31 (×4): qty 1

## 2018-01-31 MED ORDER — PANTOPRAZOLE SODIUM 40 MG PO TBEC
40.0000 mg | DELAYED_RELEASE_TABLET | Freq: Every day | ORAL | Status: DC
  Administered 2018-01-31 – 2018-02-04 (×4): 40 mg via ORAL
  Filled 2018-01-31 (×4): qty 1

## 2018-01-31 MED ORDER — NITROGLYCERIN 0.4 MG SL SUBL
SUBLINGUAL_TABLET | SUBLINGUAL | Status: AC
  Administered 2018-01-31: 0.4 mg
  Filled 2018-01-31: qty 3

## 2018-01-31 MED ORDER — POLYETHYLENE GLYCOL 3350 17 G PO PACK: 17.0000 g | PACK | Freq: Every day | ORAL | Status: DC | PRN

## 2018-01-31 MED ORDER — LORAZEPAM 2 MG/ML IJ SOLN
2.0000 mg | INTRAMUSCULAR | Status: DC | PRN
  Administered 2018-01-31 – 2018-02-03 (×2): 2 mg via INTRAVENOUS
  Filled 2018-01-31 (×2): qty 1

## 2018-01-31 MED ORDER — HYDRALAZINE HCL 25 MG PO TABS: 50.0000 mg | ORAL_TABLET | Freq: Three times a day (TID) | ORAL | Status: DC

## 2018-01-31 NOTE — ED Notes (Signed)
PAGED ADMITTING TO RN JESSICA

## 2018-01-31 NOTE — Progress Notes (Signed)
Pt arrived to 4e from [facility] ED. Pt oriented to room and staff. Pt very sleepy. Telemetry box applied and CCMD notified. Vitals obtained. Glucose 179. CHG completed. Will continue current plan of care.  Grant Fontana BSN, RN
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
## 2018-01-31 NOTE — Progress Notes (Signed)
INTERVAL PROGRESS NOTE  Paged by RN at ~8:15pm that patient was complaining of chest pain. Went to bedside to evaluate.  Upon entering the room, patient was resting in bed comfortably. He endorsed 5/10 squeezing substernal chest pain that started after he had woken up. Denies nausea, SOB, or diaphoresis. Stat EKG with more pronounced T waves in V1 and V2 but without ST elevation. Received SL NTG with resolution of the chest pain while we were in the room. Troponins have been trended, flat at 0.04. BP 191/131, O2 sats stable.  Exam: GEN: Lying comfortably in bed in NAD CV: No m/r/g, NR & RR, no tenderness to palpation on anterior chest wall PULM: No increased work of breathing, no wheezes or rales  Assessment/Plan: Recurrent chest pain in the setting of elevated BP. Troponin flat at 0.04, likely elevated secondary to demand in the setting of hypertensive urgency. Repeat EKG without ischemic changes, which is reassuring. CP did improve with NTG, possibly due to lowering BP slightly. - Continue to monitor - Changed PRN anti-hypertensive to PO hydralazine 10mg  q6h PRN for sBP >180 - Advised RN to give dose of hydralazine now - Can add SL NTG if recurrent CP  Colbert Ewing, MD Internal Medicine, PGY-1

## 2018-01-31 NOTE — ED Notes (Signed)
Date and time results received: 01/31/18 5:19 AM  (use smartphrase ".now" to insert current time)  Test: Troponin Critical Value: 0.03  Name of Provider Notified: Dr. Wyvonnia Dusky  Orders Received? Or Actions Taken?: no new orders

## 2018-01-31 NOTE — ED Notes (Signed)
ORDERED DIET TRAY FOR PT  

## 2018-01-31 NOTE — ED Notes (Signed)
Attempted to call report, RN to call back.

## 2018-01-31 NOTE — Progress Notes (Signed)
Pt complained of chest pain 6/10. bp 191/131. EKG obtained. Paged on call MD. MD at bedside. Will continue to monitor.

## 2018-01-31 NOTE — ED Notes (Signed)
Pt no longer assigned a bed at this time. Still in ED room.
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
## 2018-01-31 NOTE — ED Notes (Signed)
Pt sleeping comfortably at this time, CIWA 0 d/t pt sleeping. Will continue to monitor.

## 2018-01-31 NOTE — ED Notes (Signed)
Pt received breakfast tray 

## 2018-01-31 NOTE — Progress Notes (Signed)
Date: 01/31/2018               Patient Name:  [name] MRN: [id number]  DOB: April 19, 1961 Age / Sex: 57 y.o., male   PCP: [facility]              Medical Service: Internal Medicine Teaching Service              Attending Physician: Dr. Aldine Contes, MD    First Contact: Linwood Dibbles, [place] Pager: [phone]  Second Contact: Dr. Colbert Ewing Pager: [phone]  Third Contact Dr. Alphonzo Grieve Pager: [phone]       After Hours (After 5p/  First Contact Pager: [phone]  weekends / holidays): Second Contact Pager: [phone]   Chief Complaint: Chest pain  History of Present Illness: [name] is a 57 yo male with significant PMH of HTN, T2DM, polysubstance abuse (alcohol and cocaine), depression, and schizoaffective disorder who presents to the ED with a complaint of chest pain. Patient was transferred from [facility] by EMT after he complained of chest pain. Patient reports having an intermittent, non-radiating chest pain x1 week. He describes the pain as a "pins" in his chest that last for 4-5 min at a time and comes on about 4 times a day. The pain is 8/10 when he exerts himself but 4/10 at rest. He reports associated sweats, SOB, dizziness, cough, abdominal soreness and weakness. Patient denies fever, nausea, hematuria or dysuria. No personal or family history of MI.   He states his back hurts all the time and he has blurry vision all the time because of his drinking. He states he knows when his BP is high because his head will be "throbbing" and he would have "sweats."  Pt reports that he has suicidal ideation all the time, but when asked about plans, stated that he cannot tell us, but he is fine. Patient states he has not taken his medications for the past 3 weeks because someone stole them. He reports desire to beat up the person who stole his medications and clothes. He reports hearing voices telling him to strangle people who do not want to  help him and he sees "spirits."   He smokes 1 ppd of cigarettes. He drinks 2.5 cases of beer daily and his last drink was yesterday. He has been drinking since he was 57 years old and has a history of withdrawal symptoms such as "shakes" and vomiting in addition to withdrawal seizures. The only times he has been free of alcohol is when he is in the hospital. He also reports a "long" history of crack cocaine use with last use being 4 days ago. He denies IV drug or other illicit drug use. Patient reports he has been homeless for the past 2 months because his daughter did not pay his rent. He panhandles to get money to buy cocaine and he has been getting help at [facility]. He express desire to get his medications back and for someone to help him.   Meds: Current Facility-Administered Medications  Medication Dose Route Frequency Provider Last Rate Last Dose  . acetaminophen (TYLENOL) tablet 650 mg  650 mg Oral Q6H PRN Alphonzo Grieve, MD       Or  . acetaminophen (TYLENOL) suppository 650 mg  650 mg Rectal Q6H PRN Alphonzo Grieve, MD      . amLODipine (NORVASC) tablet 10 mg  10 mg Oral Daily Alphonzo Grieve, MD   10 mg at 01/31/18 0934  .  cloNIDine (CATAPRES - Dosed in mg/24 hr) patch 0.3 mg  0.3 mg Transdermal Weekly Rancour, Stephen, MD   0.3 mg at 01/31/18 0935  . DULoxetine (CYMBALTA) DR capsule 30 mg  30 mg Oral BID Rancour, Annie Main, MD   30 mg at 01/31/18 0934  . enoxaparin (LOVENOX) injection 40 mg  40 mg Subcutaneous Q24H Alphonzo Grieve, MD   40 mg at 01/31/18 1245  . folic acid injection 1 mg  1 mg Intravenous Daily Alphonzo Grieve, MD   1 mg at 01/31/18 1248  . hydrALAZINE (APRESOLINE) tablet 10 mg  10 mg Oral Q8H PRN Colbert Ewing, MD   10 mg at 01/31/18 1338  . insulin aspart (novoLOG) injection 0-15 Units  0-15 Units Subcutaneous TID WC Alphonzo Grieve, MD   2 Units at 01/31/18 1339  . LORazepam (ATIVAN) injection 2-3 mg  2-3 mg Intravenous Q1H PRN Alphonzo Grieve, MD   2 mg at 01/31/18  1253  . pantoprazole (PROTONIX) EC tablet 40 mg  40 mg Oral Daily Alphonzo Grieve, MD   40 mg at 01/31/18 1243  . polyethylene glycol (MIRALAX / GLYCOLAX) packet 17 g  17 g Oral Daily PRN Alphonzo Grieve, MD      . thiamine (B-1) injection 100 mg  100 mg Intravenous Daily Alphonzo Grieve, MD   100 mg at 01/31/18 1246   Current Outpatient Medications  Medication Sig Dispense Refill  . amLODipine (NORVASC) 10 MG tablet Take 1 tablet (10 mg total) by mouth daily. (Patient not taking: Reported on 01/31/2018) 30 tablet 1  . cloNIDine (CATAPRES - DOSED IN MG/24 HR) 0.3 mg/24hr patch Place 1 patch (0.3 mg total) onto the skin once a week. (Patient not taking: Reported on 01/31/2018) 4 patch 1  . DULoxetine (CYMBALTA) 30 MG capsule Take 1 capsule (30 mg total) by mouth 2 (two) times daily. (Patient not taking: Reported on 01/31/2018) 60 capsule 0  . folic acid (FOLVITE) 1 MG tablet Take 1 tablet (1 mg total) by mouth daily. (Patient not taking: Reported on 01/31/2018) 30 tablet 1  . gabapentin (NEURONTIN) 300 MG capsule Take 1 capsule (300 mg total) by mouth 3 (three) times daily. (Patient not taking: Reported on 01/31/2018) 90 capsule 0  . hydrALAZINE (APRESOLINE) 50 MG tablet Take 1 tablet (50 mg total) by mouth 3 (three) times daily. (Patient not taking: Reported on 01/31/2018) 90 tablet 1  . magnesium oxide (MAG-OX) 400 (241.3 Mg) MG tablet Take 1 tablet (400 mg total) by mouth daily. (Patient not taking: Reported on 01/31/2018) 30 tablet 0  . metFORMIN (GLUCOPHAGE) 500 MG tablet Take 1 tablet (500 mg total) by mouth 2 (two) times daily with a meal. (Patient not taking: Reported on 01/31/2018) 60 tablet 1  . paliperidone (INVEGA SUSTENNA) 156 MG/ML SUSP injection Inject 156 mg into the muscle every 30 (thirty) days.    Marland Kitchen thiamine 100 MG tablet Take 1 tablet (100 mg total) by mouth daily. (Patient not taking: Reported on 01/31/2018) 30 tablet 1    Allergies: Allergies as of 01/31/2018  . (No Known Allergies)     Past Medical History:  Diagnosis Date  . Alcohol abuse   . Arthritis   . Crack cocaine use   . Depression   . Diabetes mellitus   . Drug abuse ([place])   . DTs (delirium tremens) ([place])    history of   . Gout   . Noncompliance with medication regimen   . Schizophrenia ([place])   . Stroke ([place])   .  Uncontrolled hypertension    Past Surgical History:  Procedure Laterality Date  . MANDIBLE RECONSTRUCTION    . TONSILLECTOMY     Family History  Problem Relation Age of Onset  . Hypertension Unknown    Social History   Socioeconomic History  . Marital status: Legally Separated    Spouse name: Not on file  . Number of children: Not on file  . Years of education: Not on file  . Highest education level: Not on file  Social Needs  . Financial resource strain: Not on file  . Food insecurity - worry: Not on file  . Food insecurity - inability: Not on file  . Transportation needs - medical: Not on file  . Transportation needs - non-medical: Not on file  Occupational History  . Not on file  Tobacco Use  . Smoking status: Current Every Day Smoker    Packs/day: 0.25    Types: Cigarettes  . Smokeless tobacco: Never Used  Substance and Sexual Activity  . Alcohol use: Yes    Alcohol/week: 0.6 - 1.2 oz    Types: 1 - 2 Cans of beer per week    Comment: 2 cases of beer a day + liquour  . Drug use: Yes    Types: "Crack" cocaine, Cocaine  . Sexual activity: Not on file  Other Topics Concern  . Not on file  Social History Narrative  . Not on file   Review of Systems: Review of Systems  Constitutional: Positive for diaphoresis. Negative for chills and fever.  Eyes: Positive for blurred vision.  Respiratory: Positive for cough and shortness of breath. Negative for wheezing.   Cardiovascular: Positive for chest pain. Negative for leg swelling.  Gastrointestinal: Negative for nausea.       Abdominal soreness  Genitourinary: Negative for dysuria and hematuria.  Musculoskeletal:  Positive for back pain and myalgias.  Skin: Negative for rash.  Neurological: Positive for dizziness and weakness. Negative for headaches.  Psychiatric/Behavioral: Positive for hallucinations, memory loss, substance abuse and suicidal ideas. Negative for depression. The patient is nervous/anxious.    Physical Exam: Blood pressure (!) 171/116, pulse 98, temperature 98.7 F (37.1 C), temperature source Oral, resp. rate (!) 30, height 5\' 11"  (1.803 m), weight 192 lb (87.1 kg), SpO2 96 %.  Physical Exam  Constitutional: He is oriented to person, place, and time.  Laying in bed in NAD, well developed, appears tired and calm, slightly slurred speech  HENT:  Head: Normocephalic and atraumatic.  Eyes: Pupils are equal, round, and reactive to light.  Neck: Normal range of motion. Neck supple.  Cardiovascular: Normal rate and regular rhythm. Exam reveals no gallop and no friction rub.  No murmur heard. Pulmonary/Chest: Effort normal and breath sounds normal. He has no wheezes. He exhibits no tenderness.  Mild chest wall tenderness  Abdominal: Soft. Bowel sounds are normal. He exhibits no mass.  Mild generalized tenderness  Musculoskeletal: Normal range of motion.  Neurological: He is alert and oriented to person, place, and time. He has a normal Finger-Nose-Finger Test.  Skin: No rash noted.  Psychiatric: He expresses suicidal ideation. He expresses no suicidal plans. He exhibits disordered thought content. He has a flat affect.   Lab results: @LABTEST @  Imaging results:  Dg Chest 2 View  Result Date: 01/31/2018 CLINICAL DATA:  Acute onset of productive cough and generalized chest pain. EXAM: CHEST - 2 VIEW COMPARISON:  Chest radiograph performed 12/01/2017 FINDINGS: The lungs are well-aerated. Mild vascular congestion is noted. There is  no evidence of focal opacification, pleural effusion or pneumothorax. The heart is borderline enlarged. No acute osseous abnormalities are seen. IMPRESSION:  Mild vascular congestion and borderline cardiomegaly. Lungs remain grossly clear. Electronically Signed   By: Garald Balding M.D.   On: 01/31/2018 06:04   Ct Head Wo Contrast  Result Date: 01/31/2018 CLINICAL DATA:  Altered mental status EXAM: CT HEAD WITHOUT CONTRAST TECHNIQUE: Contiguous axial images were obtained from the base of the skull through the vertex without intravenous contrast. COMPARISON:  Head CT 03/03/2017 FINDINGS: Brain: No mass lesion, intraparenchymal hemorrhage or extra-axial collection. No evidence of acute cortical infarct. There is periventricular hypoattenuation compatible with chronic microvascular disease. Left centrum semiovale lacunar infarct is new compared to 03/03/2017 but is favored to be nonacute. Unchanged calcification of the medial left cerebellar hemisphere. Vascular: No hyperdense vessel or unexpected vascular calcification. Skull: Normal visualized skull base, calvarium and extracranial soft tissues. Sinuses/Orbits: No sinus fluid levels or advanced mucosal thickening. No mastoid effusion. Normal orbits. 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 Electronically Signed   By: Ulyses Jarred M.D.   On: 01/31/2018 04:42    Other results: EKG: normal EKG, normal sinus rhythm, unchanged from previous tracings.  Assessment & Plan by Problem: Active Problems:   Chest pain  Mr.  Pang is a 57 yo male with a significant PMH of HTN., T2DM, depression, schizoaffective disorder, hx of CVA, polysubstance abuse (cocaine and alcohol) and medication noncompliance who presented to the ED for evaluation of a 1 week history of atypical chest pain and found to have hypertensive urgency (178/110) with elevated troponin (0.03) on admission. Low suspicion for ACS due to stable EKG without acute ST changes and minimal troponin elevation. CP most likely 2/2 to hyperadrenergic state and increased demand caused  by BP med noncompliance and abrupt cessation of clonidine patch. The specific problems addressed are as follows:   Hypertensive Urgency BP of 178/110 on admission. He does not meet criteria for hypertensive emergency due to the lack of evidence for end-organ damage. Presentation most likely 2/2 to noncompliance with home BP meds and rebound HTN from abrupt cessation of clonidine patch. Patient's cocaine use is also a possible precipitant of the elevated BP. Home BP meds include Hydralazine 50 MG TID, Amlodipine 10 MG daily, and transdermal clonidine 0.3 MG weekly. He has been out of his medications for at least 3 weeks so BP is most likely chronically elevated. No significant decrease in BP with IV hydralazine in the ED. Plan to gradually decrease BP with ACEI and diuretic. Will avoid beta blockers due to possible coronary artery vasoconstriction and worsening HTN due to unopposed alpha-adrenergic stimulation from his cocaine use.  -Admit to stepdown -Start lisinopril 20 MG daily -Start HCTZ 12.5 MG daily -Continue home amlodipine 10 MG daily -PRN hydralazine 10 MG Q8H for sBP > 180 -Monitor BP  Chest/Epigastric pain CP reproducible on exam. Atypical for classic cardiac chest pain so possibly 2/2 to cocaine induced CP or gastritis from alcohol use. No acute ischemic changes on EKG. Troponin stable at 0.03. No family or personal hx of MI. Mostly likely etiology is demand ischemia 2/2 hypertensive urgency in the context of noncompliance with home BP meds. Will continue evaluating for signs of acute MI -Repeat EKG in AM -Telemetry -Trend troponin -TTE -Pantoprazole 40 MG PO daily  Polysubstance use disorder Currently no signs of withdraw. Long hx of cocaine, alcohol, tobacco abuse. On admission found to have BAL  of 66 and UDS positive for cocaine and benzos. Normal salicylate level with acetominophen level <10. Received Ativan in the ED. Reports significant daily alcohol use with hx of withdrawal  symptoms. Last ETOH was yesterday, will check hepatic function and monitor for withdraw symptoms.  -CIWA with Ativan per protocol -Folic acid 1 MG IV daily -Thiamine 100 MG IV daily  T2DM Uncontrolled due to medication noncompliant. Blood glucose 265 on admission. Home meds include lantus, humalog and metformin. Last prescription on 05/27/2017 but unclear last use. Last A1C is 8.6 from 11/24/2017.  -SSI-M  TID w/ meals -Monitor CBGs -Hypoglycemia protocol  Depression/Schizoaffective disorder Hx of SI/HI, unsuccessful suicide attempts and hallucinations with multiple ED visits and hospitalizations. Last visit for SI and auditory hallucination on 12/20/2017. Sees [name] for treatment but did not follow up for 9 months until today. Previously on paliperidone and duloxetine. Currently reports SI with no clear intent or plan and desire to beat up the person who stole his medications. Will consult psych for an evaluation and medication management.  -Psych consult, appreciate recs -Continue outpatient f/u at [facility] home duloxetine 30 MG PO BID   Homelessness Patient has been homeless for the last 2 months. Has been getting assistance from [place]. Will consult SW to assist with housing. - Social work consult, appreciate recs  FEN/GI -Heart healthy/Carb modified  VTE Prophylaxis: Lovenox  Code Status: Full  Dispo: Admit for inpatient observation until medically stable.  This is a Careers information officer Note.  The care of the patient was discussed with Dr. Colbert Ewing and the assessment and plan was formulated with their assistance.  Please see their note for official documentation of the patient encounter.   Signed: Lacinda Axon, Medical Student 01/31/2018, 2:42 PM

## 2018-01-31 NOTE — ED Notes (Signed)
Have not received clonidine patch from pharmacy, second message sent at this time.

## 2018-01-31 NOTE — ED Notes (Signed)
CIWA 0 d/t sleeping comfortably at this time.

## 2018-01-31 NOTE — ED Notes (Signed)
Pt up out of bed urinating on the floor. Pt cleaned up and put back into bed; eating lunch at this time.

## 2018-02-01 ENCOUNTER — Observation Stay (HOSPITAL_BASED_OUTPATIENT_CLINIC_OR_DEPARTMENT_OTHER): Payer: Medicaid Other

## 2018-02-01 ENCOUNTER — Other Ambulatory Visit: Payer: Self-pay

## 2018-02-01 DIAGNOSIS — E119 Type 2 diabetes mellitus without complications: Secondary | ICD-10-CM | POA: Diagnosis not present

## 2018-02-01 DIAGNOSIS — I160 Hypertensive urgency: Secondary | ICD-10-CM

## 2018-02-01 DIAGNOSIS — Y903 Blood alcohol level of 60-79 mg/100 ml: Secondary | ICD-10-CM | POA: Diagnosis not present

## 2018-02-01 DIAGNOSIS — F251 Schizoaffective disorder, depressive type: Secondary | ICD-10-CM | POA: Diagnosis not present

## 2018-02-01 DIAGNOSIS — G4700 Insomnia, unspecified: Secondary | ICD-10-CM

## 2018-02-01 DIAGNOSIS — F1414 Cocaine abuse with cocaine-induced mood disorder: Secondary | ICD-10-CM | POA: Diagnosis not present

## 2018-02-01 DIAGNOSIS — R0989 Other specified symptoms and signs involving the circulatory and respiratory systems: Secondary | ICD-10-CM

## 2018-02-01 DIAGNOSIS — E876 Hypokalemia: Secondary | ICD-10-CM

## 2018-02-01 DIAGNOSIS — I10 Essential (primary) hypertension: Secondary | ICD-10-CM | POA: Diagnosis not present

## 2018-02-01 DIAGNOSIS — F1099 Alcohol use, unspecified with unspecified alcohol-induced disorder: Secondary | ICD-10-CM | POA: Diagnosis not present

## 2018-02-01 DIAGNOSIS — M2550 Pain in unspecified joint: Secondary | ICD-10-CM | POA: Diagnosis not present

## 2018-02-01 DIAGNOSIS — Z590 Homelessness: Secondary | ICD-10-CM | POA: Diagnosis not present

## 2018-02-01 DIAGNOSIS — F17210 Nicotine dependence, cigarettes, uncomplicated: Secondary | ICD-10-CM

## 2018-02-01 DIAGNOSIS — R079 Chest pain, unspecified: Secondary | ICD-10-CM | POA: Diagnosis not present

## 2018-02-01 DIAGNOSIS — R0789 Other chest pain: Secondary | ICD-10-CM | POA: Diagnosis not present

## 2018-02-01 DIAGNOSIS — F1490 Cocaine use, unspecified, uncomplicated: Secondary | ICD-10-CM | POA: Diagnosis not present

## 2018-02-01 DIAGNOSIS — F259 Schizoaffective disorder, unspecified: Secondary | ICD-10-CM | POA: Diagnosis not present

## 2018-02-01 DIAGNOSIS — R1013 Epigastric pain: Secondary | ICD-10-CM | POA: Diagnosis not present

## 2018-02-01 LAB — CBC
HEMATOCRIT: 43.7 % (ref 39.0–52.0)
HEMOGLOBIN: 14.3 g/dL (ref 13.0–17.0)
MCH: 29.7 pg (ref 26.0–34.0)
MCHC: 32.7 g/dL (ref 30.0–36.0)
MCV: 90.7 fL (ref 78.0–100.0)
Platelets: 160 10*3/uL (ref 150–400)
RBC: 4.82 MIL/uL (ref 4.22–5.81)
RDW: 13.1 % (ref 11.5–15.5)
WBC: 6.5 10*3/uL (ref 4.0–10.5)

## 2018-02-01 LAB — BASIC METABOLIC PANEL
ANION GAP: 12 (ref 5–15)
BUN: 9 mg/dL (ref 6–20)
CHLORIDE: 103 mmol/L (ref 101–111)
CO2: 24 mmol/L (ref 22–32)
Calcium: 9 mg/dL (ref 8.9–10.3)
Creatinine, Ser: 0.93 mg/dL (ref 0.61–1.24)
GFR calc Af Amer: >60 mL/min (ref >60)
GLUCOSE: 164 mg/dL — AB (ref 65–99)
POTASSIUM: 3.1 mmol/L — AB (ref 3.5–5.1)
Sodium: 139 mmol/L (ref 135–145)

## 2018-02-01 LAB — GLUCOSE, CAPILLARY
GLUCOSE-CAPILLARY: 257 mg/dL — AB (ref 65–99)
GLUCOSE-CAPILLARY: 229 mg/dL — AB (ref 65–99)
Glucose-Capillary: 183 mg/dL — ABNORMAL HIGH (ref 65–99)
Glucose-Capillary: 136 mg/dL — ABNORMAL HIGH (ref 65–99)

## 2018-02-01 LAB — ECHOCARDIOGRAM COMPLETE
HEIGHTINCHES: 71 in
WEIGHTICAEL: 3082.91 [oz_av]

## 2018-02-01 LAB — HIV ANTIBODY (ROUTINE TESTING W REFLEX): HIV Screen 4th Generation wRfx: NONREACTIVE

## 2018-02-01 MED ORDER — PALIPERIDONE ER 3 MG PO TB24
3.0000 mg | ORAL_TABLET | Freq: Every day | ORAL | Status: DC
  Administered 2018-02-01 – 2018-02-04 (×4): 3 mg via ORAL
  Filled 2018-02-01 (×4): qty 1

## 2018-02-01 MED ORDER — LISINOPRIL 10 MG PO TABS
20.0000 mg | ORAL_TABLET | Freq: Every day | ORAL | Status: DC
  Administered 2018-02-01 – 2018-02-02 (×2): 20 mg via ORAL
  Filled 2018-02-01: qty 2
  Filled 2018-02-01: qty 4
  Filled 2018-02-01: qty 2

## 2018-02-01 MED ORDER — GABAPENTIN 300 MG PO CAPS
300.0000 mg | ORAL_CAPSULE | Freq: Two times a day (BID) | ORAL | Status: DC
  Administered 2018-02-01 – 2018-02-04 (×6): 300 mg via ORAL
  Filled 2018-02-01 (×6): qty 1

## 2018-02-01 MED ORDER — POTASSIUM CHLORIDE CRYS ER 20 MEQ PO TBCR
40.0000 meq | EXTENDED_RELEASE_TABLET | Freq: Once | ORAL | Status: AC
  Administered 2018-02-01: 40 meq via ORAL
  Filled 2018-02-01: qty 2

## 2018-02-01 MED ORDER — HYDROCHLOROTHIAZIDE 12.5 MG PO CAPS
12.5000 mg | ORAL_CAPSULE | Freq: Every day | ORAL | Status: DC
  Administered 2018-02-01 – 2018-02-04 (×3): 12.5 mg via ORAL
  Filled 2018-02-01 (×3): qty 1

## 2018-02-01 MED ORDER — VITAMIN B-1 100 MG PO TABS
100.0000 mg | ORAL_TABLET | Freq: Every day | ORAL | Status: DC
  Administered 2018-02-02 – 2018-02-04 (×3): 100 mg via ORAL
  Filled 2018-02-01 (×3): qty 1

## 2018-02-01 MED ORDER — FOLIC ACID 1 MG PO TABS
1.0000 mg | ORAL_TABLET | Freq: Every day | ORAL | Status: DC
  Administered 2018-02-02 – 2018-02-03 (×2): 1 mg via ORAL
  Filled 2018-02-01 (×3): qty 1

## 2018-02-01 NOTE — Progress Notes (Addendum)
Subjective:  [name] was seen resting in bed comfortably this morning. He endorsed squeezing chest pain this morning that felt similar to his episode last night. Endorses HA and body aches. Denies dysuria or other urinary symptoms. Denies SI/HI this morning. Sitter at bedside. Feels that he needs his psych medications to feel better.  Objective:  Vital signs in last 24 hours: Vitals:   02/01/18 0013 02/01/18 0259 02/01/18 0301 02/01/18 0402  BP: (!) 171/118 (!) 175/140 (!) 181/113 (!) 172/107  Pulse: 85   93  Resp:      Temp: 98 F (36.7 C)   98.1 F (36.7 C)  TempSrc: Oral   Oral  SpO2: 96%   97%  Weight:      Height:       GEN: Well-nourished male lying in bed. Appears tired. Alert and oriented. No acute distress. Slurred speech. EYES: Sclera non-icteric. Conjunctiva clear. RESP: Clear to auscultation bilaterally. No wheezes, rales, or rhonchi. No increased work of breathing. CV: Normal rate and regular rhythm. No murmurs, gallops, or rubs. No LE edema. No anterior chest wall tenderness. ABD: Soft. Non-tender. Non-distended. Normoactive bowel sounds. EXT: No edema. Warm and well perfused. NEURO: Cranial nerves II-XII grossly intact. Able to lift all four extremities against gravity. No apparent audiovisual hallucinations. Speech fluent and appropriate. No resting tremors or tongue fasciculations PSYCH: Patient is calm. Appropriate affect. Well-groomed; speech is appropriate and on-subject.  Assessment/Plan:  Active Problems:   Chest pain  [name] is a 57yo male with PMH of HTN, schizoaffective disorder, depression, diabetes, hx of CVA, and polysubstance use disorder (including alcohol and cocaine) who presents with chest pain, found to be hypertensive to 170s/110s, likely secondary to noncompliance with home medications and rebound HTN from abrupt cessation of clonidine patch. Chest pain likely secondary to demand in setting of hypertensive urgency with flat  troponins at 0.03 and without ischemic changes on EKG.  Hypertensive urgency Secondary to noncompliance with home medications and rebound HTN from stopping clonidine. Home anti-hypertensives include transdermal clonidine 0.3mg  weekly, hydralazine 50mg  TID, and amlodipine 10mg  daily, but has been out of medications for at least 3 weeks. BP remains elevated at 172/107, working on gradual lowering due to likely chronically elevated blood pressures at home. Endorses HA. Hydralazine and clonidine likely not the best options for blood pressure control for him - will d/c clonidine patch and start lisinopril and HCTZ. Avoid beta blockers given patient's persistent cocaine use. - Continue home amlodipine 10mg  daily - D/c transdermal clonidine patch - Start lisinopril 20mg  daily - Start HCTZ 12.5mg  daily - PO hydralazine 10mg  q6h PRN for sBP > 180  Atypical chest pain / Epigastric pain Had a recurrent episode of chest pain last night that responded to lowering blood pressure without ischemic changes on EKG. Troponins flat at 0.03. Possibly gastritis secondary to alcohol use, cocaine-induced chest pain, or 2/2 hypertension. - Telemetry - Continue PO pantoprazole 40mg  daily  Elevated BNP, Pulmonary vascular congestion on CXR Clinically appears euvolemic. Possible heart failure secondary to chronic hypertension? Will further evaluate with echo. - TTE  DM BG 164-194 overnight. Last A1c 8.6 in 11/2017. Home regimen includes metformin 500mg  BID, but has been off all medications for at least 3 weeks. - SSI-M TID WC - CBG monitoring  Hypokalemia K 3.1 - PO K 29mEq x1 - BMP in AM  Polysubstance use disorder Including tobacco, alcohol, and crack cocaine. Hx of withdrawal seizures and DTs. BAL 66 on admission, and UDS positive for  benzos and cocaine. CIWA 5 -> 0, has only required 3mg  Ativan thus far. No resting tremors on exam. - CIWA with Ativan per stepdown protocol - MVI, thiamine,  folate  Depression Schizoaffective disorder Hx of SI/HI, hallucinations, and multiple ED visits/admissions for suicidal thoughts. Patient was previously on paliperidone 156mg  injections every 30 days. Has been off all medications for at least 1 month. Has seen [name] in the past. On admission, he did endorse SI, unclear whether he has a plan, as well as desire to "beat up the person who stole my medications". - Psych consulted; appreciate their assistance - Continue home duloxetine 30mg  BID - Will need to continue outpatient follow-up at [facility] For the last 2 months. States that [facility] was supposed to provide him assistance with housing, however he had not been able to follow up on this. - SW consulted; appreciate their assistance  Dispo: Anticipated discharge in approximately 1-2 day(s).   Colbert Ewing, MD 02/01/2018, 6:26 AM Pager: Mamie Nick [phone]

## 2018-02-01 NOTE — Progress Notes (Signed)
  Echocardiogram 2D Echocardiogram has been performed.  Nicholl Onstott T Oryn Casanova 02/01/2018, 12:46 PM

## 2018-02-01 NOTE — Consult Note (Signed)
[facility] Face-to-Face Psychiatry Consult   Reason for Consult:  ''schizophrenia, SI/HI.'' Referring Physician:  Dr. Lysbeth Galas Patient Identification: Nawaf Strange MRN:  [id number] Principal Diagnosis: Cocaine abuse with cocaine-induced mood disorder [facility]) Diagnosis:   Patient Active Problem List   Diagnosis Date Noted  . Schizoaffective disorder, depressive type ([place]) [F25.1]     Priority: High  . Chest pain [R07.9] 01/31/2018  . Hypokalemia [E87.6]   . Schizoaffective disorder, bipolar type ([place]) [F25.0] 11/22/2017  . Trauma [T14.90XA]   . Tachycardia [R00.0] 03/03/2017  . Alcohol abuse [F10.10]   . Cocaine abuse ([place]) [F14.10]   . Hypertensive urgency [I16.0]   . Tachycardia [R00.0]   . Cocaine abuse with cocaine-induced mood disorder ([place]) [F14.14] 12/05/2016  . Cerebrovascular disease [I67.9] 09/02/2015  . 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 Malignant hypertension [I10] 08/30/2015  . Alcohol use disorder, severe, dependence ([place]) [F10.20] 08/30/2015  . Alcohol withdrawal ([place]) [F10.239] 08/30/2015  . Alcohol abuse with alcohol-induced mood disorder ([place]) [F10.14] 08/30/2015  . Thrombocytopenia ([place]) [D69.6] 08/27/2015  . Tobacco use disorder [F17.200] 08/26/2015  . Hyperlipidemia [E78.5] 04/28/2015  . Gout [M10.9] 08/09/2013  . Diabetes mellitus ([place]) [E11.9] 07/09/2012    Total Time spent with patient: 1 hour  Subjective:   [name] is a 57 y.o. male patient admitted with chest pain.  HPI:  Patient who reports history of Schizoaffective disorder, depression, diabetes, hx of CVA, Hypertension and polysubstance use disorder (including alcohol, benzopdiazepine and cocaine) who presents with chest pain. Patient reports that he is non-compliant with his medications for the past one month and has b een self medicating with Cocaine, Alcohol and occasionally benzodiazepine. He states that he is currently  homeless and as a result people have been stealing from him. He reports someone stole his medications and has thus been out of them for over a month but says he did not keep his appointment at [facility] where he gets his medications. He states that he reports feeling suicidal and homicidal yesterday so that he could get admitted to the hospital.  Today, he continues to report chest discomfort, vague depressive symptoms but denies suicidal/homicidal thoughts, psychosis or delusional thinking.  Past Psychiatric History: as above  Risk to Self: Is patient at risk for suicide?: No, but patient needs Medical Clearance Risk to Others:   Prior Inpatient Therapy:   Prior Outpatient Therapy:    Past Medical History:  Past Medical History:  Diagnosis Date  . Alcohol abuse   . Arthritis   . Crack cocaine use   . Depression   . Diabetes mellitus   . Drug abuse ([place])   . DTs (delirium tremens) ([place])    history of   . Gout   . Noncompliance with medication regimen   . Schizophrenia ([place])   . Stroke ([place])   . Uncontrolled hypertension     Past Surgical History:  Procedure Laterality Date  . MANDIBLE RECONSTRUCTION    . TONSILLECTOMY     Family History:  Family History  Problem Relation Age of Onset  . Hypertension Unknown    Family Psychiatric  History:  Social History:  Social History   Substance and Sexual Activity  Alcohol Use Yes  . Alcohol/week: 0.6 - 1.2 oz  . Types: 1 - 2 Cans of beer per week   Comment: 2 cases of beer a day + liquour     Social History   Substance and Sexual Activity  Drug Use Yes  . Types: "  Crack" cocaine, Cocaine    Social History   Socioeconomic History  . Marital status: Legally Separated    Spouse name: None  . Number of children: None  . Years of education: None  . Highest education level: None  Social Needs  . Financial resource strain: None  . Food insecurity - worry: None  . Food insecurity - inability: None  . Transportation needs -  medical: None  . Transportation needs - non-medical: None  Occupational History  . None  Tobacco Use  . Smoking status: Current Every Day Smoker    Packs/day: 0.25    Types: Cigarettes  . Smokeless tobacco: Never Used  Substance and Sexual Activity  . Alcohol use: Yes    Alcohol/week: 0.6 - 1.2 oz    Types: 1 - 2 Cans of beer per week    Comment: 2 cases of beer a day + liquour  . Drug use: Yes    Types: "Crack" cocaine, Cocaine  . Sexual activity: None  Other Topics Concern  . None  Social History Narrative  . None   Additional Social History:    Allergies:  No Known Allergies  Labs:  Results for orders placed or performed during the hospital encounter of 01/31/18 (from the past 48 hour(s))  CBC with Differential/Platelet     Status: Abnormal   Collection Time: 01/31/18  4:05 AM  Result Value Ref Range   WBC 5.4 4.0 - 10.5 K/uL   RBC 4.25 4.22 - 5.81 MIL/uL   Hemoglobin 12.8 (L) 13.0 - 17.0 g/dL   HCT 39.5 39.0 - 52.0 %   MCV 92.9 78.0 - 100.0 fL   MCH 30.1 26.0 - 34.0 pg   MCHC 32.4 30.0 - 36.0 g/dL   RDW 13.4 11.5 - 15.5 %   Platelets 168 150 - 400 K/uL   Neutrophils Relative % 47 %   Neutro Abs 2.6 1.7 - 7.7 K/uL   Lymphocytes Relative 45 %   Lymphs Abs 2.4 0.7 - 4.0 K/uL   Monocytes Relative 6 %   Monocytes Absolute 0.3 0.1 - 1.0 K/uL   Eosinophils Relative 2 %   Eosinophils Absolute 0.1 0.0 - 0.7 K/uL   Basophils Relative 0 %   Basophils Absolute 0.0 0.0 - 0.1 K/uL    Comment: Performed at [facility], [address], [place], [place] [address]  Comprehensive metabolic panel     Status: Abnormal   Collection Time: 01/31/18  4:05 AM  Result Value Ref Range   Sodium 139 135 - 145 mmol/L   Potassium 3.5 3.5 - 5.1 mmol/L   Chloride 104 101 - 111 mmol/L   CO2 21 (L) 22 - 32 mmol/L   Glucose, Bld 265 (H) 65 - 99 mg/dL   BUN 8 6 - 20 mg/dL   Creatinine, Ser 0.92 0.61 - 1.24 mg/dL   Calcium 8.8 (L) 8.9 - 10.3 mg/dL   Total Protein 6.8 6.5 - 8.1 g/dL    Albumin 3.2 (L) 3.5 - 5.0 g/dL   AST 69 (H) 15 - 41 U/L   ALT 28 17 - 63 U/L   Alkaline Phosphatase 68 38 - 126 U/L   Total Bilirubin 0.5 0.3 - 1.2 mg/dL   GFR calc non Af Amer >60 >60 mL/min   GFR calc Af Amer >60 >60 mL/min    Comment: (NOTE) The eGFR has been calculated using the CKD EPI equation. This calculation has not been validated in all clinical situations. eGFR's persistently <  60 mL/min signify possible Chronic Kidney Disease.    Anion gap 14 5 - 15    Comment: Performed at [facility] [address] [place], [place] [address]  Troponin I     Status: Abnormal   Collection Time: 01/31/18  4:05 AM  Result Value Ref Range   Troponin I 0.03 (HH) <0.03 ng/mL    Comment: CRITICAL RESULT CALLED TO, READ BACK BY AND VERIFIED WITH: RAMCLUR S,RN 01/31/18 0519 WAYK Performed at [facility] [address], [place], [place] [address]   Rapid urine drug screen (hospital performed)     Status: Abnormal   Collection Time: 01/31/18  4:05 AM  Result Value Ref Range   Opiates NONE DETECTED NONE DETECTED   Cocaine POSITIVE (A) NONE DETECTED   Benzodiazepines POSITIVE (A) NONE DETECTED   Amphetamines NONE DETECTED NONE DETECTED   Tetrahydrocannabinol NONE DETECTED NONE DETECTED   Barbiturates NONE DETECTED NONE DETECTED    Comment: (NOTE) DRUG SCREEN FOR MEDICAL PURPOSES ONLY.  IF CONFIRMATION IS NEEDED FOR ANY PURPOSE, NOTIFY LAB WITHIN 5 DAYS. LOWEST DETECTABLE LIMITS FOR URINE DRUG SCREEN Drug Class                     Cutoff (ng/mL) Amphetamine and metabolites    1000 Barbiturate and metabolites    200 Benzodiazepine                 254 Tricyclics and metabolites     300 Opiates and metabolites        300 Cocaine and metabolites        300 THC                            50 Performed at [facility], [place] [address] [place], [place] [address]   Urinalysis, Routine w reflex microscopic     Status: Abnormal   Collection Time: 01/31/18  4:05 AM   Result Value Ref Range   Color, Urine STRAW (A) YELLOW   APPearance CLEAR CLEAR   Specific Gravity, Urine 1.006 1.005 - 1.030   pH 6.0 5.0 - 8.0   Glucose, UA >=500 (A) NEGATIVE mg/dL   Hgb urine dipstick SMALL (A) NEGATIVE   Bilirubin Urine NEGATIVE NEGATIVE   Ketones, ur NEGATIVE NEGATIVE mg/dL   Protein, ur 100 (A) NEGATIVE mg/dL   Nitrite NEGATIVE NEGATIVE   Leukocytes, UA NEGATIVE NEGATIVE   RBC / HPF 0-5 0 - 5 RBC/hpf   WBC, UA 0-5 0 - 5 WBC/hpf   Bacteria, UA NONE SEEN NONE SEEN   Squamous Epithelial / LPF NONE SEEN NONE SEEN    Comment: Performed at [facility], [place] [address], [place], [place] [address]  Ethanol     Status: Abnormal   Collection Time: 01/31/18  4:05 AM  Result Value Ref Range   Alcohol, Ethyl (B) 66 (H) <10 mg/dL    Comment:        LOWEST DETECTABLE LIMIT FOR SERUM ALCOHOL IS 10 mg/dL FOR MEDICAL PURPOSES ONLY Performed at [facility], [place] [address] [place], [place] [address]   Acetaminophen level     Status: Abnormal   Collection Time: 01/31/18  4:05 AM  Result Value Ref Range   Acetaminophen (Tylenol), Serum <10 (L) 10 - 30 ug/mL    Comment:        THERAPEUTIC CONCENTRATIONS VARY SIGNIFICANTLY. A RANGE OF 10-30  ug/mL MAY BE AN EFFECTIVE CONCENTRATION FOR MANY PATIENTS. HOWEVER, SOME ARE BEST TREATED AT CONCENTRATIONS OUTSIDE THIS RANGE. ACETAMINOPHEN CONCENTRATIONS >150 ug/mL AT 4 HOURS AFTER INGESTION AND >50 ug/mL AT 12 HOURS AFTER INGESTION ARE OFTEN ASSOCIATED WITH TOXIC REACTIONS. Performed at [facility], [place] [address], [place], [place] [address]   Salicylate level     Status: None   Collection Time: 01/31/18  4:05 AM  Result Value Ref Range   Salicylate Lvl <0.9 2.8 - 30.0 mg/dL    Comment: Performed at [facility] [address] [place], [place] [address]  Troponin I     Status: Abnormal   Collection Time: 01/31/18  6:01 AM  Result Value Ref Range   Troponin I 0.03 (HH) <0.03 ng/mL     Comment: CRITICAL VALUE NOTED.  VALUE IS CONSISTENT WITH PREVIOUSLY REPORTED AND CALLED VALUE. Performed at [facility], [place] [address] [place], [place] [address]   Brain natriuretic peptide     Status: Abnormal   Collection Time: 01/31/18  7:30 AM  Result Value Ref Range   B Natriuretic Peptide 482.3 (H) 0.0 - 100.0 pg/mL    Comment: Performed at [facility] [address] [place], [place] [address]  CBG monitoring, ED     Status: Abnormal   Collection Time: 01/31/18  8:01 AM  Result Value Ref Range   Glucose-Capillary 202 (H) 65 - 99 mg/dL  CBG monitoring, ED     Status: Abnormal   Collection Time: 01/31/18  8:15 AM  Result Value Ref Range   Glucose-Capillary 173 (H) 65 - 99 mg/dL  Phosphorus     Status: None   Collection Time: 01/31/18  9:59 AM  Result Value Ref Range   Phosphorus 3.4 2.5 - 4.6 mg/dL    Comment: Performed at [facility], [place] [address], [place], [place] [address]  Troponin I     Status: Abnormal   Collection Time: 01/31/18 12:40 PM  Result Value Ref Range   Troponin I 0.04 (HH) <0.03 ng/mL    Comment: CRITICAL VALUE NOTED.  VALUE IS CONSISTENT WITH PREVIOUSLY REPORTED AND CALLED VALUE. Performed at [facility], [place] [address] [place], [place] [address]   CBG monitoring, ED     Status: Abnormal   Collection Time: 01/31/18 12:40 PM  Result Value Ref Range   Glucose-Capillary 136 (H) 65 - 99 mg/dL  Glucose, capillary     Status: Abnormal   Collection Time: 01/31/18  5:24 PM  Result Value Ref Range   Glucose-Capillary 179 (H) 65 - 99 mg/dL  Troponin I     Status: Abnormal   Collection Time: 01/31/18  6:16 PM  Result Value Ref Range   Troponin I 0.04 (HH) <0.03 ng/mL    Comment: CRITICAL VALUE NOTED.  VALUE IS CONSISTENT WITH PREVIOUSLY REPORTED AND CALLED VALUE. Performed at [facility], [place] [address] [place], [place] [address]   Glucose, capillary     Status: Abnormal   Collection Time: 01/31/18  9:18 PM  Result  Value Ref Range   Glucose-Capillary 194 (H) 65 - 99 mg/dL  HIV antibody     Status: None   Collection Time: 02/01/18  2:52 AM  Result Value Ref Range   HIV Screen 4th Generation wRfx Non Reactive Non Reactive    Comment: (NOTE) Performed At: [facility] [address] [place], [place] [id number] Rush Farmer MD [id number] Performed at [facility], [address] [place], [place] [address]   Basic metabolic panel     Status: Abnormal   Collection Time: 02/01/18  2:52 AM  Result Value Ref Range   Sodium 139 135 - 145 mmol/L   Potassium 3.1 (L) 3.5 - 5.1 mmol/L   Chloride 103 101 - 111 mmol/L   CO2 24 22 - 32 mmol/L   Glucose, Bld 164 (H) 65 - 99 mg/dL   BUN 9 6 - 20 mg/dL   Creatinine, Ser 0.93 0.61 - 1.24 mg/dL   Calcium 9.0 8.9 - 10.3 mg/dL   GFR calc non Af Amer >60 >60 mL/min   GFR calc Af Amer >60 >60 mL/min    Comment: (NOTE) The eGFR has been calculated using the CKD EPI equation. This calculation has not been validated in all clinical situations. eGFR's persistently <60 mL/min signify possible Chronic Kidney Disease.    Anion gap 12 5 - 15    Comment: Performed at [facility] [address], [place] [address]  CBC     Status: None   Collection Time: 02/01/18  2:52 AM  Result Value Ref Range   WBC 6.5 4.0 - 10.5 K/uL   RBC 4.82 4.22 - 5.81 MIL/uL   Hemoglobin 14.3 13.0 - 17.0 g/dL   HCT 43.7 39.0 - 52.0 %   MCV 90.7 78.0 - 100.0 fL   MCH 29.7 26.0 - 34.0 pg   MCHC 32.7 30.0 - 36.0 g/dL   RDW 13.1 11.5 - 15.5 %   Platelets 160 150 - 400 K/uL    Comment: Performed at [facility], [place] [address] [place], [place] [address]  Glucose, capillary     Status: Abnormal   Collection Time: 02/01/18  6:14 AM  Result Value Ref Range   Glucose-Capillary 183 (H) 65 - 99 mg/dL  Glucose, capillary     Status: Abnormal   Collection Time: 02/01/18 12:00 PM  Result Value Ref Range   Glucose-Capillary 257 (H) 65 - 99 mg/dL   Comment  1 Notify RN   Glucose, capillary     Status: Abnormal   Collection Time: 02/01/18  4:08 PM  Result Value Ref Range   Glucose-Capillary 229 (H) 65 - 99 mg/dL   Comment 1 Notify RN    Comment 2 Document in Chart     Current Facility-Administered Medications  Medication Dose Route Frequency Provider Last Rate Last Dose  . acetaminophen (TYLENOL) tablet 650 mg  650 mg Oral Q6H PRN Alphonzo Grieve, MD   650 mg at 02/01/18 1247   Or  . acetaminophen (TYLENOL) suppository 650 mg  650 mg Rectal Q6H PRN Alphonzo Grieve, MD      . amLODipine (NORVASC) tablet 10 mg  10 mg Oral Daily Alphonzo Grieve, MD   10 mg at 02/01/18 0837  . DULoxetine (CYMBALTA) DR capsule 30 mg  30 mg Oral BID Rancour, Annie Main, MD   30 mg at 02/01/18 1224  . enoxaparin (LOVENOX) injection 40 mg  40 mg Subcutaneous Q24H Alphonzo Grieve, MD   40 mg at 02/01/18 1248  . [START ON 07/12/36] folic acid (FOLVITE) tablet 1 mg  1 mg Oral Daily Narendra, Nischal, MD      . gabapentin (NEURONTIN) capsule 300 mg  300 mg Oral BID Keiden Deskin, MD      . hydrALAZINE (APRESOLINE) tablet 10 mg  10 mg Oral Q6H PRN Alphonzo Grieve, MD   10 mg at 02/01/18 1320  .  hydrochlorothiazide (MICROZIDE) capsule 12.5 mg  12.5 mg Oral Daily Colbert Ewing, MD   12.5 mg at 02/01/18 1247  . insulin aspart (novoLOG) injection 0-15 Units  0-15 Units Subcutaneous TID WC Alphonzo Grieve, MD   8 Units at 02/01/18 1315  . lisinopril (PRINIVIL,ZESTRIL) tablet 20 mg  20 mg Oral Daily Colbert Ewing, MD   20 mg at 02/01/18 1247  . LORazepam (ATIVAN) injection 2-3 mg  2-3 mg Intravenous Q1H PRN Alphonzo Grieve, MD   2 mg at 01/31/18 1253  . paliperidone (INVEGA) 24 hr tablet 3 mg  3 mg Oral Daily Ayano Douthitt, MD      . pantoprazole (PROTONIX) EC tablet 40 mg  40 mg Oral Daily Alphonzo Grieve, MD   40 mg at 02/01/18 0838  . polyethylene glycol (MIRALAX / GLYCOLAX) packet 17 g  17 g Oral Daily PRN Alphonzo Grieve, MD      . Derrill Memo ON 02/02/2018] thiamine  (VITAMIN B-1) tablet 100 mg  100 mg Oral Daily Aldine Contes, MD        Musculoskeletal: Strength & Muscle Tone: within normal limits Gait & Station: not tested Patient leans: N/A  Psychiatric Specialty Exam: Physical Exam  Psychiatric: Judgment and thought content normal. His speech is delayed. He is slowed and withdrawn. Cognition and memory are normal. He exhibits a depressed mood.    Review of Systems  Constitutional: Positive for malaise/fatigue.  HENT: Negative.   Eyes: Negative.   Respiratory: Negative.   Cardiovascular: Positive for chest pain.  Gastrointestinal: Negative.   Genitourinary: Negative.   Musculoskeletal: Positive for joint pain.  Skin: Negative.   Neurological: Positive for weakness.  Endo/Heme/Allergies: Negative.   Psychiatric/Behavioral: Positive for depression and substance abuse. The patient has insomnia.     Blood pressure (!) 186/113, pulse 93, temperature 97.8 F (36.6 C), temperature source Oral, resp. rate (!) 25, height _0  (1.803 m), weight 87.4 kg (192 lb 10.9 oz), SpO2 97 %.Body mass index is 26.87 kg/m.  General Appearance: Casual  Eye Contact:  Good  Speech:  Clear and Coherent  Volume:  Decreased  Mood:  Dysphoric  Affect:  Congruent  Thought Process:  Coherent and Descriptions of Associations: Intact  Orientation:  Full (Time, Place, and Person)  Thought Content:  Logical  Suicidal Thoughts:  No  Homicidal Thoughts:  No  Memory:  Immediate;   Good Recent;   Good Remote;   Fair  Judgement:  Fair  Insight:  Fair  Psychomotor Activity:  Psychomotor Retardation  Concentration:  Concentration: Fair and Attention Span: Fair  Recall:  Good  Fund of Knowledge:  Good  Language:  Good  Akathisia:  No  Handed:  Right  AIMS (if indicated):     Assets:  Communication Skills Desire for Improvement  ADL's:  Intact  Cognition:  WNL  Sleep:   fair     Treatment Plan Summary: 57 year old man with history of Schizoaffective  disorder, Polysubstance dependence who was admitted to the hospital due to severe chest in the context of excessive illicit drug use. Patient currently denies SI/HI, psychosis or delusions.  Plan/Recommendations: -Restart Invega 3 mg daily for schizoaffective disorder, Gabapentin 357m bid for mood/cocaine and continue Cymbalta for depression -[facility]. -Patient is cleared by psychiatric service. Re-consult psych as needed.  Disposition: No evidence of imminent risk to self or others at present.   Patient does not meet criteria for psychiatric  inpatient admission. Supportive therapy provided about ongoing stressors.  Unit social worker to assist with outpatient referral when patient is discharged.  Corena Pilgrim, MD 02/01/2018 4:56 PM

## 2018-02-02 ENCOUNTER — Other Ambulatory Visit: Payer: Self-pay

## 2018-02-02 ENCOUNTER — Encounter (HOSPITAL_COMMUNITY): Payer: Self-pay | Admitting: Cardiology

## 2018-02-02 DIAGNOSIS — I160 Hypertensive urgency: Principal | ICD-10-CM

## 2018-02-02 DIAGNOSIS — I5021 Acute systolic (congestive) heart failure: Secondary | ICD-10-CM | POA: Diagnosis not present

## 2018-02-02 DIAGNOSIS — E876 Hypokalemia: Secondary | ICD-10-CM | POA: Diagnosis present

## 2018-02-02 DIAGNOSIS — F17210 Nicotine dependence, cigarettes, uncomplicated: Secondary | ICD-10-CM | POA: Diagnosis present

## 2018-02-02 DIAGNOSIS — F1490 Cocaine use, unspecified, uncomplicated: Secondary | ICD-10-CM | POA: Diagnosis not present

## 2018-02-02 DIAGNOSIS — I5041 Acute combined systolic (congestive) and diastolic (congestive) heart failure: Secondary | ICD-10-CM | POA: Diagnosis present

## 2018-02-02 DIAGNOSIS — Y903 Blood alcohol level of 60-79 mg/100 ml: Secondary | ICD-10-CM | POA: Diagnosis not present

## 2018-02-02 DIAGNOSIS — I5040 Unspecified combined systolic (congestive) and diastolic (congestive) heart failure: Secondary | ICD-10-CM

## 2018-02-02 DIAGNOSIS — Z590 Homelessness: Secondary | ICD-10-CM | POA: Diagnosis not present

## 2018-02-02 DIAGNOSIS — Z8673 Personal history of transient ischemic attack (TIA), and cerebral infarction without residual deficits: Secondary | ICD-10-CM | POA: Diagnosis not present

## 2018-02-02 DIAGNOSIS — I110 Hypertensive heart disease with heart failure: Secondary | ICD-10-CM

## 2018-02-02 DIAGNOSIS — I200 Unstable angina: Secondary | ICD-10-CM

## 2018-02-02 DIAGNOSIS — F259 Schizoaffective disorder, unspecified: Secondary | ICD-10-CM | POA: Diagnosis not present

## 2018-02-02 DIAGNOSIS — F251 Schizoaffective disorder, depressive type: Secondary | ICD-10-CM | POA: Diagnosis present

## 2018-02-02 DIAGNOSIS — R079 Chest pain, unspecified: Secondary | ICD-10-CM | POA: Diagnosis not present

## 2018-02-02 DIAGNOSIS — Z9114 Patient's other noncompliance with medication regimen: Secondary | ICD-10-CM | POA: Diagnosis not present

## 2018-02-02 DIAGNOSIS — R0789 Other chest pain: Secondary | ICD-10-CM | POA: Diagnosis not present

## 2018-02-02 DIAGNOSIS — I248 Other forms of acute ischemic heart disease: Secondary | ICD-10-CM | POA: Diagnosis present

## 2018-02-02 DIAGNOSIS — Z8249 Family history of ischemic heart disease and other diseases of the circulatory system: Secondary | ICD-10-CM | POA: Diagnosis not present

## 2018-02-02 DIAGNOSIS — R1013 Epigastric pain: Secondary | ICD-10-CM | POA: Diagnosis not present

## 2018-02-02 DIAGNOSIS — E785 Hyperlipidemia, unspecified: Secondary | ICD-10-CM | POA: Diagnosis present

## 2018-02-02 DIAGNOSIS — Z7984 Long term (current) use of oral hypoglycemic drugs: Secondary | ICD-10-CM | POA: Diagnosis not present

## 2018-02-02 DIAGNOSIS — E119 Type 2 diabetes mellitus without complications: Secondary | ICD-10-CM | POA: Diagnosis present

## 2018-02-02 DIAGNOSIS — Z720 Tobacco use: Secondary | ICD-10-CM

## 2018-02-02 DIAGNOSIS — G4700 Insomnia, unspecified: Secondary | ICD-10-CM | POA: Diagnosis present

## 2018-02-02 DIAGNOSIS — Z79899 Other long term (current) drug therapy: Secondary | ICD-10-CM | POA: Diagnosis not present

## 2018-02-02 DIAGNOSIS — N179 Acute kidney failure, unspecified: Secondary | ICD-10-CM | POA: Diagnosis present

## 2018-02-02 DIAGNOSIS — I43 Cardiomyopathy in diseases classified elsewhere: Secondary | ICD-10-CM | POA: Diagnosis present

## 2018-02-02 DIAGNOSIS — K2920 Alcoholic gastritis without bleeding: Secondary | ICD-10-CM | POA: Diagnosis present

## 2018-02-02 DIAGNOSIS — F1414 Cocaine abuse with cocaine-induced mood disorder: Secondary | ICD-10-CM | POA: Diagnosis present

## 2018-02-02 DIAGNOSIS — F1099 Alcohol use, unspecified with unspecified alcohol-induced disorder: Secondary | ICD-10-CM | POA: Diagnosis not present

## 2018-02-02 DIAGNOSIS — R45851 Suicidal ideations: Secondary | ICD-10-CM | POA: Diagnosis present

## 2018-02-02 DIAGNOSIS — F1010 Alcohol abuse, uncomplicated: Secondary | ICD-10-CM | POA: Diagnosis present

## 2018-02-02 LAB — BASIC METABOLIC PANEL
ANION GAP: 11 (ref 5–15)
BUN: 14 mg/dL (ref 6–20)
CO2: 24 mmol/L (ref 22–32)
Calcium: 8.9 mg/dL (ref 8.9–10.3)
Chloride: 102 mmol/L (ref 101–111)
Creatinine, Ser: 1.15 mg/dL (ref 0.61–1.24)
GFR calc Af Amer: >60 mL/min (ref >60)
Glucose, Bld: 169 mg/dL — ABNORMAL HIGH (ref 65–99)
POTASSIUM: 3.3 mmol/L — AB (ref 3.5–5.1)
SODIUM: 137 mmol/L (ref 135–145)

## 2018-02-02 LAB — GLUCOSE, CAPILLARY
GLUCOSE-CAPILLARY: 181 mg/dL — AB (ref 65–99)
GLUCOSE-CAPILLARY: 211 mg/dL — AB (ref 65–99)
Glucose-Capillary: 202 mg/dL — ABNORMAL HIGH (ref 65–99)
Glucose-Capillary: 146 mg/dL — ABNORMAL HIGH (ref 65–99)

## 2018-02-02 LAB — MAGNESIUM: Magnesium: 1.8 mg/dL (ref 1.7–2.4)

## 2018-02-02 MED ORDER — SODIUM CHLORIDE 0.9% FLUSH
3.0000 mL | INTRAVENOUS | Status: DC | PRN
Start: 2018-02-02 — End: 2018-02-03

## 2018-02-02 MED ORDER — ASPIRIN 81 MG PO CHEW
81.0000 mg | CHEWABLE_TABLET | ORAL | Status: AC
  Administered 2018-02-03: 81 mg via ORAL
  Filled 2018-02-02: qty 1

## 2018-02-02 MED ORDER — SODIUM CHLORIDE 0.9% FLUSH
3.0000 mL | Freq: Two times a day (BID) | INTRAVENOUS | Status: DC
  Administered 2018-02-02: 3 mL via INTRAVENOUS

## 2018-02-02 MED ORDER — ATORVASTATIN CALCIUM 40 MG PO TABS
40.0000 mg | ORAL_TABLET | Freq: Every day | ORAL | Status: DC
  Administered 2018-02-02 – 2018-02-03 (×2): 40 mg via ORAL
  Filled 2018-02-02 (×2): qty 1

## 2018-02-02 MED ORDER — ASPIRIN EC 81 MG PO TBEC
81.0000 mg | DELAYED_RELEASE_TABLET | Freq: Every day | ORAL | Status: DC
  Administered 2018-02-02 – 2018-02-04 (×2): 81 mg via ORAL
  Filled 2018-02-02 (×2): qty 1

## 2018-02-02 MED ORDER — PNEUMOCOCCAL VAC POLYVALENT 25 MCG/0.5ML IJ INJ: 0.5000 mL | INJECTION | INTRAMUSCULAR | Status: DC

## 2018-02-02 MED ORDER — POTASSIUM CHLORIDE CRYS ER 20 MEQ PO TBCR
60.0000 meq | EXTENDED_RELEASE_TABLET | Freq: Once | ORAL | Status: AC
  Administered 2018-02-02: 60 meq via ORAL
  Filled 2018-02-02: qty 3

## 2018-02-02 MED ORDER — INFLUENZA VAC SPLIT QUAD 0.5 ML IM SUSY: 0.5000 mL | PREFILLED_SYRINGE | INTRAMUSCULAR | Status: DC

## 2018-02-02 NOTE — Progress Notes (Signed)
Subjective: Patient was resting comfortably in bed. He denies any chest pain, SOB, urinary symptoms but reports ongoing muscle aches. He denies any SI/HI or hallucinations this AM. Patient states he did not remember meeting with psych yesterday. Psych informed us that patient admitted he endorsed SI/HI so he could be admitted. Patient was informed that psych does not think he needs inpatient hospitalization but would need a referral to get back in touch with [name]. He was unable to remember the month or year, but remembers his name, DOB, and current location. Sitter at bedside.   Objective: Vital signs in last 24 hours: Vitals:   02/02/18 1019 02/02/18 1025 02/02/18 1157 02/02/18 1235  BP: 136/87 136/87 (!) 148/112   Pulse:   98   Resp:      Temp:      TempSrc:      SpO2:   100%   Weight:    194 lb 0.1 oz (88 kg)  Height:       Weight change:   Intake/Output Summary (Last 24 hours) at 02/02/2018 1329 Last data filed at 02/02/2018 1254 Gross per 24 hour  Intake 600 ml  Output -  Net 600 ml   Physical Exam  Constitutional: He is well-developed, well-nourished, and in no distress.  HENT:  Head: Normocephalic and atraumatic.  Eyes: Conjunctivae are normal. Pupils are equal, round, and reactive to light.  Neck: Normal range of motion. Neck supple.  Cardiovascular: Normal rate and regular rhythm. Exam reveals no gallop and no friction rub.  No murmur heard. Pulmonary/Chest: Effort normal and breath sounds normal. He has no wheezes.  Abdominal: Soft. Bowel sounds are normal.  Musculoskeletal: Normal range of motion.  Neurological: He is alert. He displays no weakness and no tremor.  Slurred speech. Oriented x2 (not to year or mth), but remembers DOB and current president  Skin: Skin is warm. No rash noted.  Psychiatric: Mood normal. He expresses no homicidal and no suicidal ideation.    Lab Results: @LABTEST2 @ Micro Results: No results found for this or any previous visit  (from the past 240 hour(s)). Studies/Results: No results found. Medications: I have reviewed the patient's current medications. Scheduled Meds: . amLODipine  10 mg Oral Daily  . DULoxetine  30 mg Oral BID  . enoxaparin (LOVENOX) injection  40 mg Subcutaneous Q24H  . folic acid  1 mg Oral Daily  . gabapentin  300 mg Oral BID  . hydrochlorothiazide  12.5 mg Oral Daily  . insulin aspart  0-15 Units Subcutaneous TID WC  . lisinopril  20 mg Oral Daily  . paliperidone  3 mg Oral Daily  . pantoprazole  40 mg Oral Daily  . thiamine  100 mg Oral Daily   Continuous Infusions: PRN Meds:.acetaminophen, hydrALAZINE, LORazepam, polyethylene glycol Assessment/Plan: Principal Problem:   Chest pain Active Problems:   Cocaine abuse with cocaine-induced mood disorder (HCC)   Hypertensive urgency   Acute systolic heart failure [facility])  Mr.  Brown is a 57 yo male with a significant PMH of HTN., T2DM, depression, schizoaffective disorder, hx of CVA, polysubstance abuse (cocaine and alcohol) and medication noncompliance who presented to the ED for evaluation of a 1 week history of atypical chest pain and found to have hypertensive urgency (178/110) with elevated troponin (0.03) on admission. Low suspicion for ACS due to stable EKG without acute ST changes and minimal troponin elevation. CP most likely 2/2 to hyperadrenergic state and increased demand caused by BP med noncompliance and abrupt cessation of  clonidine patch. The specific problems addressed are as follows:   Mixed systolic and diastolic HF Echo from yesterday significant for EF of 40-45%, mod LVH, hypokinesis of anterolateral, inferolateral, and inferior myocardium consistent with grade 2 diastolic dysfunction. Findings mostly likely 2/2 to chronic cocaine abuse or uncontrolled HTN in the setting of medication noncompliance. BNP elevated to 482.3 on admission with evidence of pulmonary vascular congestion on CXR.  -Cardio consulted, appreciate  recs -Continue daily weights -Strict I/Os  Hypertensive Urgency BP improved to 158/110 this AM. Patient will benefit from a beta blocker in reducing HTN but in the setting of the new diagnosis of HF, patient's cocaine use is concerning for unopposed alpha blockage leading to worsening HTN. Will continue amlodipine, lisinopril and HCTZ. Plan to increase lisinopril dose as needed for BP control.  -Continue home amlodipine 10 MG daily -Continue HCTZ 12.5 MG daily -Continue Lisinopril 20 MG daily -PRN hydralazine 10 MG Q6H for sBP >180  Chest/Epigastric pain No CP this AM. Atypical CP presentation possibly 2/2 to cocaine induced CP or gastritis from alcohol use. Repeat EKG without evidence of acute ischemic changes. Troponin stable at 0.04. No family or personal hx of MI. Mostly likely etiology is demand ischemia 2/2 hypertensive urgency in the context of noncompliance with home BP meds. Will continue evaluating for signs of acute MI -Telemetry -Pantoprazole 40 MG PO daily  Polysubstance use disorder No headache, N/V or abd pain. No signs of tremor on exam. CIWA 5 to 0 this AM. Long hx of cocaine, alcohol, tobacco abuse. On admission found to have BAL of 66 and UDS positive for cocaine and benzos. Normal salicylate level with acetominophen level <10. Received Ativan in the ED and has not received any since then. Reports significant daily alcohol use with hx of withdrawal symptoms. Last ETOH was 3/15, will monitor for withdraw symptoms.  -CIWA with Ativan per protocol -Folic acid 1 MG IV daily -Thiamine 100 MG IV daily  T2DM No significant improvement. CBG 136-202 in last 24 hrs.Uncontrolled due to medication noncompliant. Home meds include lantus, humalog and metformin. Last prescription on 05/27/2017 but unclear last use. Last A1C is 8.6 from 11/24/2017.  -SSI-M TID w/ meals -Monitor CBGs -Hypoglycemia protocol  Hypokalemia K of 3.3 this AM. No current symptoms. Will replete and  monitor. -PO K 66mEq x1 -BMP in AM  Depression/Schizoaffective disorder Hx of SI/HI, unsuccessful suicide attempts and hallucinations with multiple ED visits and hospitalizations. Last visit for SI and auditory hallucination on 12/20/2017. Sees [name] for treatment but did not follow up for 9 months until today. Previously on paliperidone and duloxetine. Saw psych yesterday. Does not meet criteria for inpatient psych admission. They recommend he follow up with [facility] for med management and polysubstance use treatment. -Psych consulted, appreciate recs -Start paliperidone 3 MG daily, per psych recs -Continue outpatient f/u at [facility] gabapentin 300 MG BID, per psych recs -Continue home duloxetine 30 MG PO BID   Homelessness Patient has been homeless for the last 2 months. Has been getting assistance from [facility]. Will consult SW to assist with housing. - Social work consulted, appreciate recs  FEN/GI -Heart healthy/Carb modified  VTE Prophylaxis: Lovenox  Code Status: Full  Dispo: admit for inpatient observation until medically stable.  This is a Careers information officer Note.  The care of the patient was discussed with Dr. Ronalee Red and the assessment and plan formulated with their assistance.  Please see their attached note for official documentation of the daily encounter.   LOS:  0 days   Lacinda Axon, Medical Student 02/02/2018, 1:29 PM

## 2018-02-02 NOTE — Progress Notes (Signed)
Subjective:  [name] was seen resting in bed comfortably this morning. He denies current chest pain or shortness of breath. Denies dysuria or other urinary symptoms. Continues to endorse body aches. Denies SI/HI this morning. Sitter at bedside.  Objective:  Vital signs in last 24 hours: Vitals:   02/01/18 1320 02/01/18 1955 02/02/18 0000 02/02/18 0420  BP: (!) 186/113 (!) 138/97 (!) 158/107   Pulse:  81    Resp:  (!) 22 (!) 22   Temp:  98.1 F (36.7 C) 98.3 F (36.8 C) 98.1 F (36.7 C)  TempSrc:  Oral Oral Oral  SpO2:    96%  Weight:      Height:       GEN: Well-nourished male lying in bed. Appears tired. Alert and oriented x2 (not to year). Able to tell [place] the president. No acute distress. Mildly slurred speech. EYES: Sclera non-icteric. Conjunctiva clear. RESP: Clear to auscultation bilaterally. No wheezes, rales, or rhonchi. No increased work of breathing. CV: Normal rate and regular rhythm. No murmurs, gallops, or rubs. No LE edema. No anterior chest wall tenderness. ABD: Soft. Non-tender. Non-distended. Normoactive bowel sounds. EXT: No edema. Warm and well perfused. NEURO: Cranial nerves II-XII grossly intact. Able to lift all four extremities against gravity. No apparent audiovisual hallucinations. Speech fluent and appropriate. No resting tremors noted. PSYCH: Patient is calm. Appropriate affect. Well-groomed; speech is appropriate and on-subject.  Assessment/Plan:  Principal Problem:   Cocaine abuse with cocaine-induced mood disorder [facility]) Active Problems:   Chest pain  [name] is a 57yo male with PMH of HTN, schizoaffective disorder, depression, diabetes, hx of CVA, and polysubstance use disorder (including alcohol and cocaine) who presents with chest pain, found to be hypertensive to 170s/110s, likely secondary to noncompliance with home medications and rebound HTN from abrupt cessation of clonidine patch. Chest pain likely secondary to demand in setting  of hypertensive urgency with flat troponins at 0.03 and without ischemic changes on EKG.  Combined systolic and diastolic heart failure Echo obtained yesterday shows newly reduced EF at 40-45%, grade 2 DD, and hypokinesis of anterolateral, inferolateral, and inferior myocardium. BNP elevated on admission and CXR showed pulmonary vascular congestion. Etiology likely combination of cocaine use and uncontrolled hypertension. Euvolemic on exam. Would benefit from beta blocker, however he continues to use cocaine. - Cardiology consulted; appreciate their assistance - Continue lisinopril - Daily weights - I/Os  Hypertensive urgency, improved Changing anti-hypertensive regimen to amlodipine 10mg  daily, lisinopril, and HCTZ. Will continue to uptitrate lisinopril as able. Would benefit from beta blocker as well, however he does continue to use cocaine. BP improved this morning to 158/110.  - Continue home amlodipine 10mg  daily - D/c transdermal clonidine patch - Continue lisinopril 20mg  daily - Continue HCTZ 12.5mg  daily - PO hydralazine 10mg  q6h PRN for sBP > 180  Atypical chest pain / Epigastric pain EKG reassuring. Troponins flat at 0.03. Possibly gastritis secondary to alcohol use, cocaine-induced chest pain, or 2/2 hypertension. Denies current chest pain. - Telemetry - Continue PO pantoprazole 40mg  daily  DM BG 136-181. Last A1c 8.6 in 11/2017. Home regimen includes metformin 500mg  BID, but has been off all medications for at least 3 weeks. - SSI-M TID WC - CBG monitoring  Hypokalemia K 3.3. Will replete - PO K 47mEq x1 - BMP in AM  Polysubstance use disorder Including tobacco, alcohol, and crack cocaine. Hx of withdrawal seizures and DTs. BAL 66 on admission, and UDS positive for benzos and cocaine. CIWA 5 ->  0, has only required 3mg  Ativan thus far. No resting tremors on exam. - CIWA with Ativan per stepdown protocol - MVI, thiamine, folate  Depression Schizoaffective  disorder Hx of SI/HI, hallucinations, and multiple ED visits/admissions for suicidal thoughts. Patient was previously on paliperidone 156mg  injections every 30 days. Has been off all medications for at least 1 month. Seen by Psych yesterday. Does not meet criteria for inpatient Psych. Will need to f/u with [facility] outpatient. - Psych consulted; appreciate their assistance - Continue home duloxetine 30mg  BID - Start paliperidone 3mg  daily, per Psych recs - Start gabapentin 300mg  BID, per Psych recs - SW consulted for assistance with referral to [facility]  Homelessness For the last 2 months. States that [facility] was supposed to provide him assistance with housing, however he had not been able to follow up on this. - SW consulted; appreciate their assistance  Dispo: Anticipated discharge in approximately 1-2 day(s).   Colbert Ewing, MD 02/02/2018, 9:54 AM Pager: Mamie Nick [phone]
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
## 2018-02-02 NOTE — Progress Notes (Signed)
Inpatient Diabetes Program Recommendations  AACE/ADA: New Consensus Statement on Inpatient Glycemic Control (2015)  Target Ranges:  Prepandial:   less than 140 mg/dL      Peak postprandial:   less than 180 mg/dL (1-2 hours)      Critically ill patients:  140 - 180 mg/dL   Lab Results  Component Value Date   GLUCAP 202 (H) 02/02/2018   HGBA1C 8.6 (H) 11/24/2017    Review of Glycemic Control Results for [name], [name] (MRN [id number]) as of 02/02/2018 15:38  Ref. Range 02/01/2018 16:08 02/01/2018 21:04 02/02/2018 06:11 02/02/2018 11:46  Glucose-Capillary Latest Ref Range: 65 - 99 mg/dL 229 (H) 136 (H) 181 (H) 202 (H)   Diabetes history: Type 2 DM Outpatient Diabetes medications: Metformin 500 mg BID Current orders for Inpatient glycemic control: Novolog 0-15 Units TID  Inpatient Diabetes Program Recommendations:    Reviewed patient's current A1c of 8.6%. Explained what a A1c is and what it measures. Also reviewed goal A1c with patient, importance of good glucose control @ home, and blood sugar goals. Stressed importance of taking oral medications for diabetes in regards to long term health goals and prevention of additional co-morbidities.   Patient admits to not taking his home medications for at least 3 months because of difficulty obtaining transportation. At this time patient's main concern is his living arrangements post discharge. Patient is to receive income check at first of the month and can purchase metformin as long as he can find transportation. Patient difficult to understand and cannot remember going to his PCP office, [facility]. Informed that consults are placed for social work and case management in an attempt to get a plan in place for outpatient. Did stress the importance of being accountable for his diabetes as he secures living arrangements.   Will need a meter and test strips. Blood glucose meter kit (includes lancets and strips) ([id number]) RN agreeable to helping  patient learn how to use device.   Thanks, Bronson Curb, MSN, RNC-OB Diabetes Coordinator [phone] (8a-5p)
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
## 2018-02-03 ENCOUNTER — Encounter (HOSPITAL_COMMUNITY): Payer: Self-pay | Admitting: Interventional Cardiology

## 2018-02-03 ENCOUNTER — Ambulatory Visit (HOSPITAL_COMMUNITY)
Admission: RE | Admit: 2018-02-03 | Payer: Medicaid Other | Source: Ambulatory Visit | Admitting: Interventional Cardiology

## 2018-02-03 ENCOUNTER — Inpatient Hospital Stay (HOSPITAL_COMMUNITY)
Admission: EM | Disposition: A | Discharge status: Home / Self Care | Payer: Self-pay | Source: Home / Self Care | Attending: Internal Medicine

## 2018-02-03 DIAGNOSIS — N179 Acute kidney failure, unspecified: Secondary | ICD-10-CM

## 2018-02-03 DIAGNOSIS — I43 Cardiomyopathy in diseases classified elsewhere: Secondary | ICD-10-CM

## 2018-02-03 HISTORY — PX: LEFT HEART CATH AND CORONARY ANGIOGRAPHY: CATH118249

## 2018-02-03 LAB — BASIC METABOLIC PANEL
Anion gap: 11 (ref 5–15)
BUN: 18 mg/dL (ref 6–20)
CALCIUM: 8.8 mg/dL — AB (ref 8.9–10.3)
CO2: 23 mmol/L (ref 22–32)
CREATININE: 1.3 mg/dL — AB (ref 0.61–1.24)
Chloride: 100 mmol/L — ABNORMAL LOW (ref 101–111)
GFR calc non Af Amer: >60 mL/min (ref >60)
Glucose, Bld: 206 mg/dL — ABNORMAL HIGH (ref 65–99)
Potassium: 3.5 mmol/L (ref 3.5–5.1)
SODIUM: 134 mmol/L — AB (ref 135–145)

## 2018-02-03 LAB — CBC
HEMATOCRIT: 42.6 % (ref 39.0–52.0)
Hemoglobin: 13.6 g/dL (ref 13.0–17.0)
MCH: 30 pg (ref 26.0–34.0)
MCHC: 31.9 g/dL (ref 30.0–36.0)
MCV: 93.8 fL (ref 78.0–100.0)
Platelets: 159 10*3/uL (ref 150–400)
RBC: 4.54 MIL/uL (ref 4.22–5.81)
RDW: 13.7 % (ref 11.5–15.5)
WBC: 6.1 10*3/uL (ref 4.0–10.5)

## 2018-02-03 LAB — PROTIME-INR
INR: 0.95
PROTHROMBIN TIME: 12.6 s (ref 11.4–15.2)

## 2018-02-03 LAB — GLUCOSE, CAPILLARY
GLUCOSE-CAPILLARY: 147 mg/dL — AB (ref 65–99)
GLUCOSE-CAPILLARY: 155 mg/dL — AB (ref 65–99)
Glucose-Capillary: 188 mg/dL — ABNORMAL HIGH (ref 65–99)
Glucose-Capillary: 232 mg/dL — ABNORMAL HIGH (ref 65–99)

## 2018-02-03 SURGERY — LEFT HEART CATH AND CORONARY ANGIOGRAPHY
Anesthesia: LOCAL

## 2018-02-03 MED ORDER — HEPARIN (PORCINE) IN NACL 2-0.9 UNIT/ML-% IJ SOLN
INTRAMUSCULAR | Status: AC | PRN
  Administered 2018-02-03 (×2): 500 mL

## 2018-02-03 MED ORDER — MIDAZOLAM HCL 2 MG/2ML IJ SOLN
INTRAMUSCULAR | Status: AC
  Filled 2018-02-03: qty 2

## 2018-02-03 MED ORDER — HEPARIN SODIUM (PORCINE) 1000 UNIT/ML IJ SOLN
INTRAMUSCULAR | Status: DC | PRN
  Administered 2018-02-03: 4500 [IU] via INTRAVENOUS

## 2018-02-03 MED ORDER — SODIUM CHLORIDE 0.9% FLUSH: 3.0000 mL | INTRAVENOUS | Status: DC | PRN

## 2018-02-03 MED ORDER — FENTANYL CITRATE (PF) 100 MCG/2ML IJ SOLN
INTRAMUSCULAR | Status: AC
  Filled 2018-02-03: qty 2

## 2018-02-03 MED ORDER — LIDOCAINE HCL (PF) 1 % IJ SOLN
INTRAMUSCULAR | Status: DC | PRN
  Administered 2018-02-03: 2 mL

## 2018-02-03 MED ORDER — ONDANSETRON HCL 4 MG/2ML IJ SOLN: 4.0000 mg | Freq: Four times a day (QID) | INTRAMUSCULAR | Status: DC | PRN

## 2018-02-03 MED ORDER — SODIUM CHLORIDE 0.9 % IV SOLN: 250.0000 mL | INTRAVENOUS | Status: DC | PRN

## 2018-02-03 MED ORDER — IOPAMIDOL (ISOVUE-370) INJECTION 76%
INTRAVENOUS | Status: AC
  Filled 2018-02-03: qty 100

## 2018-02-03 MED ORDER — HEPARIN (PORCINE) IN NACL 2-0.9 UNIT/ML-% IJ SOLN
INTRAMUSCULAR | Status: DC | PRN
  Administered 2018-02-03: 10 mL via INTRA_ARTERIAL

## 2018-02-03 MED ORDER — VERAPAMIL HCL 2.5 MG/ML IV SOLN
INTRAVENOUS | Status: AC
  Filled 2018-02-03: qty 2

## 2018-02-03 MED ORDER — ACETAMINOPHEN 325 MG PO TABS: 650.0000 mg | ORAL_TABLET | ORAL | Status: DC | PRN

## 2018-02-03 MED ORDER — POTASSIUM CHLORIDE CRYS ER 20 MEQ PO TBCR
40.0000 meq | EXTENDED_RELEASE_TABLET | Freq: Once | ORAL | Status: AC
  Administered 2018-02-03: 40 meq via ORAL
  Filled 2018-02-03: qty 2

## 2018-02-03 MED ORDER — IOPAMIDOL (ISOVUE-370) INJECTION 76%
INTRAVENOUS | Status: DC | PRN
  Administered 2018-02-03: 90 mL

## 2018-02-03 MED ORDER — SODIUM CHLORIDE 0.9 % IV SOLN: INTRAVENOUS | Status: DC

## 2018-02-03 MED ORDER — MIDAZOLAM HCL 2 MG/2ML IJ SOLN
INTRAMUSCULAR | Status: DC | PRN
  Administered 2018-02-03: 2 mg via INTRAVENOUS

## 2018-02-03 MED ORDER — HEPARIN (PORCINE) IN NACL 2-0.9 UNIT/ML-% IJ SOLN
INTRAMUSCULAR | Status: AC
  Filled 2018-02-03: qty 1000

## 2018-02-03 MED ORDER — SODIUM CHLORIDE 0.9% FLUSH
3.0000 mL | Freq: Two times a day (BID) | INTRAVENOUS | Status: DC
  Administered 2018-02-03: 3 mL via INTRAVENOUS

## 2018-02-03 MED ORDER — FENTANYL CITRATE (PF) 100 MCG/2ML IJ SOLN
INTRAMUSCULAR | Status: DC | PRN
  Administered 2018-02-03: 25 ug via INTRAVENOUS

## 2018-02-03 MED ORDER — HEPARIN SODIUM (PORCINE) 1000 UNIT/ML IJ SOLN
INTRAMUSCULAR | Status: AC
  Filled 2018-02-03: qty 1

## 2018-02-03 MED ORDER — LIDOCAINE HCL (PF) 1 % IJ SOLN
INTRAMUSCULAR | Status: AC
  Filled 2018-02-03: qty 30

## 2018-02-03 MED ORDER — SODIUM CHLORIDE 0.9 % IV SOLN
INTRAVENOUS | Status: DC
  Administered 2018-02-03: 05:00:00 via INTRAVENOUS

## 2018-02-03 SURGICAL SUPPLY — 14 items
BAND CMPR LRG ZPHR (HEMOSTASIS) ×1
BAND ZEPHYR COMPRESS 30 LONG (HEMOSTASIS) ×1 IMPLANT
CATH INFINITI 5 FR JL3.5 (CATHETERS) ×1 IMPLANT
CATH INFINITI JR4 5F (CATHETERS) ×1 IMPLANT
GUIDEWIRE INQWIRE 1.5J.035X260 (WIRE) IMPLANT
INQWIRE 1.5J .035X260CM (WIRE) ×2
KIT HEART LEFT (KITS) ×2 IMPLANT
NDL PERC 21GX4CM (NEEDLE) IMPLANT
NEEDLE PERC 21GX4CM (NEEDLE) ×2 IMPLANT
PACK CARDIAC CATHETERIZATION (CUSTOM PROCEDURE TRAY) ×2 IMPLANT
SHEATH RAIN RADIAL 21G 6FR (SHEATH) ×1 IMPLANT
TRANSDUCER W/STOPCOCK (MISCELLANEOUS) ×2 IMPLANT
TUBING CIL FLEX 10 FLL-RA (TUBING) ×2 IMPLANT
WIRE HI TORQ VERSACORE-J 145CM (WIRE) ×1 IMPLANT

## 2018-02-03 NOTE — Progress Notes (Signed)
Progress Note  Patient Name: [name] Date of Encounter: 02/03/2018  Primary Cardiologist: Dr. Debara Pickett  Subjective   Patient denies chest pain this AM.  Seems more alert today.  Anticipating cardiac cath this morning.  Inpatient Medications    Scheduled Meds: . amLODipine  10 mg Oral Daily  . aspirin EC  81 mg Oral Daily  . atorvastatin  40 mg Oral q1800  . DULoxetine  30 mg Oral BID  . enoxaparin (LOVENOX) injection  40 mg Subcutaneous Q24H  . folic acid  1 mg Oral Daily  . gabapentin  300 mg Oral BID  . hydrochlorothiazide  12.5 mg Oral Daily  . Influenza vac split quadrivalent PF  0.5 mL Intramuscular Tomorrow-1000  . insulin aspart  0-15 Units Subcutaneous TID WC  . paliperidone  3 mg Oral Daily  . pantoprazole  40 mg Oral Daily  . pneumococcal 23 valent vaccine  0.5 mL Intramuscular Tomorrow-1000  . sodium chloride flush  3 mL Intravenous Q12H  . thiamine  100 mg Oral Daily   Continuous Infusions: . sodium chloride    . sodium chloride 50 mL/hr at 02/03/18 0651   PRN Meds: sodium chloride, acetaminophen **OR** acetaminophen, hydrALAZINE, LORazepam, polyethylene glycol, sodium chloride flush   Vital Signs    Vitals:   02/02/18 2013 02/03/18 0014 02/03/18 0400 02/03/18 0823  BP: (!) 141/111 (!) 136/97 (!) 132/92 (!) 128/93  Pulse: 75 84 76 (!) 104  Resp: (!) 21 18 18 16   Temp: 98 F (36.7 C) (!) 97.5 F (36.4 C) 98.1 F (36.7 C) 98 F (36.7 C)  TempSrc: Oral Oral Oral Oral  SpO2: 94% 100% 98% 99%  Weight:  191 lb (86.6 kg) 191 lb (86.6 kg)   Height:        Intake/Output Summary (Last 24 hours) at 02/03/2018 0941 Last data filed at 02/03/2018 0651 Gross per 24 hour  Intake 435.83 ml  Output 400 ml  Net 35.83 ml   Filed Weights   02/02/18 1756 02/03/18 0014 02/03/18 0400  Weight: 190 lb 14.4 oz (86.6 kg) 191 lb (86.6 kg) 191 lb (86.6 kg)    Physical Exam   General: Well developed, well nourished, NAD Skin: Warm, dry, intact  Head:  Normocephalic, atraumatic,clear, moist mucus membranes. Neck: Negative for carotid bruits. No JVD Lungs:Clear to ausculation bilaterally. No wheezes, rales, or rhonchi. Breathing is unlabored. Cardiovascular: RRR with S1 S2. No murmurs, rubs, or gallops Abdomen: Soft, non-tender, non-distended with normoactive bowel sounds. No obvious abdominal masses. MSK: Strength and tone appear normal for age. 5/5 in all extremities Extremities: No edema. No clubbing or cyanosis. DP/PT pulses 2+ bilaterally Neuro: Alert and oriented. No focal deficits. No facial asymmetry. MAE spontaneously. Psych: Responds to questions appropriately with normal affect.    Labs    Chemistry Recent Labs  Lab 01/31/18 0405 02/01/18 0252 02/02/18 0650 02/03/18 0242  NA 139 139 137 134*  K 3.5 3.1* 3.3* 3.5  CL 104 103 102 100*  CO2 21* 24 24 23   GLUCOSE 265* 164* 169* 206*  BUN 8 9 14 18   CREATININE 0.92 0.93 1.15 1.30*  CALCIUM 8.8* 9.0 8.9 8.8*  PROT 6.8  --   --   --   ALBUMIN 3.2*  --   --   --   AST 69*  --   --   --   ALT 28  --   --   --   ALKPHOS 68  --   --   --  BILITOT 0.5  --   --   --   GFRNONAA >60 >60 >60 >60  GFRAA >60 >60 >60 >60  ANIONGAP 14 12 11 11      Hematology Recent Labs  Lab 01/31/18 0405 02/01/18 0252 02/03/18 0242  WBC 5.4 6.5 6.1  RBC 4.25 4.82 4.54  HGB 12.8* 14.3 13.6  HCT 39.5 43.7 42.6  MCV 92.9 90.7 93.8  MCH 30.1 29.7 30.0  MCHC 32.4 32.7 31.9  RDW 13.4 13.1 13.7  PLT 168 160 159    Cardiac Enzymes Recent Labs  Lab 01/31/18 0405 01/31/18 0601 01/31/18 1240 01/31/18 1816  TROPONINI 0.03* 0.03* 0.04* 0.04*   No results for input(s): TROPIPOC in the last 168 hours.   BNP Recent Labs  Lab 01/31/18 0730  BNP 482.3*     DDimer No results for input(s): DDIMER in the last 168 hours.   Radiology    No results found.  Telemetry    02/03/2018 HR 92- Personally Reviewed  ECG    02/02/2018 NSR with evidence of LVH- Personally Reviewed  Cardiac  Studies   ECHO: 02/01/18: Study Conclusions  - Left ventricle: The cavity size was normal. There was moderate concentric hypertrophy. Systolic function was mildly to moderately reduced. The estimated ejection fraction was in the range of 40% to 45%. Hypokinesis of the anterolateral, inferolateral, and inferior myocardium. Features are consistent with a pseudonormal left ventricular filling pattern, with concomitant abnormal relaxation and increased filling pressure (grade 2 diastolic dysfunction). - Aortic valve: Transvalvular velocity was within the normal range. There was no stenosis. There was trivial regurgitation. - Mitral valve: Transvalvular velocity was within the normal range. There was no evidence for stenosis. There was trivial regurgitation. Valve area by pressure half-time: 1.91 cm^2. - Right ventricle: The cavity size was normal. Wall thickness was normal. Systolic function was normal. - Tricuspid valve: There was no regurgitation. - Pulmonic valve: Transvalvular velocity was within the normal range. There was no evidence for stenosis. There was trivial regurgitation.  Echo 08/26/18: Study Conclusions  - Left ventricle: The cavity size was normal. Wall thickness was increased increased in a pattern of mild to moderate LVH. Systolic function was normal. The estimated ejection fraction was in the range of 55% to 60%. Wall motion was normal; there were no regional wall motion abnormalities. Left ventricular diastolic function parameters were normal. - Aortic valve: Valve area (VTI): 2.15 cm^2. Valve area (Vmax): 2.42 cm^2. - Aorta: The proximal ascending aorta is mildly dilated at 3.7 cm. - Left atrium: The atrium was mildly dilated. - Technically adequate study.  Transthoracic echocardiography. M-mode, complete 2D, spectral Doppler, and color Doppler. Birthdate: Patient birthdate: March 04, 1961. Age: Patient is 57 yr  old. Sex: Gender: male. BMI: 29.4 kg/m^2. Blood pressure: 169/109 Patient status: Inpatient. Study date: Study date: 08/27/2015. Study time: 10:56 AM. Location: Bedside.  Patient Profile     57 y.o. male with a hx of HTN, tobacco use, schizoaffective disorder, depression, DM 2, history of CVA and polysubstance use disorder who is being seen today for the evaluation of chest pain and new decreased LVEF (down to 40-45% from 55-60% in 2016) at the request of Dr. Ronalee Red.   Assessment & Plan    1.  Acute combined systolic and grade 2 diastolic heart failure: -Echocardiogram 02/01/2018 with new LVEF 40-45% with moderate concentric hypertrophy and hypokinesis of the anterior lateral, inferior lateral and inferior myocardium.  Features are consistent with pseudo-normal left ventricular filling pattern, with containment abnormal relaxation and increased  filling pressure, grade 2 diastolic dysfunction -Echocardiogram 08/27/2015 LVEF 55-60% and no wall motion abnormalities -BNP on arrival, 42 with chest x-ray showing mild vascular congestion and borderline cardiomegaly -UDS positive for cocaine, benzodiazepines -No BB secondary to cocaine use -ACEI to continue per IM given worsening renal function -Plans for cardiac catheterization today 02/03/2018 for further ischemic evaluation, patient with gentle hydration given low EF and worsening renal function  2.  Atypical chest pain: -Continues to have mild, left-sided anterior chest pain -Likely secondary to hypertensive urgency?  BPs on admission 171/113 with occasional noncompliance -Elevated troponin, 0.04, 0.04 -EKG with no acute changes from previous tracing -Plan for cardiac catheterization today 02/03/2018 for further ischemic evaluation  3.  HTN urgency: -Improving, 28/93, 132/92, 136/97 -Amlodipine 10 mg, HCTZ 12.5 mg -ACEI discontinued today secondary to worsening renal function -Monitor renal function  4.  DM 2: -Last hemoglobin  A1c, 8.6 -Per IM, this is a  5.  Polysubstance abuse disorder: -UDS positive cocaine, benzodiazepines  6.  AKI: -Cr, elevated at 1.30 today>> from 1.15 on 02/02/2018 -Baseline appears to be in the 0.9-1.0 range -Monitor post-cath renal function, patient will be precath hydrated    Signed, Kathyrn Drown NP-C [place] Pager: [phone] 02/03/2018, 9:41 AM     For questions or updates, please contact   Please consult [email or web address] for contact info under Cardiology/STEMI.

## 2018-02-03 NOTE — Progress Notes (Signed)
Subjective: [name] was laying in bed comfortably in no acute this distress. He stated that was feeling well this AM. He denies any chest pain or SOB. Plan for left heart cath today. He was advised to go back to [place] so they can help him manage his medications and substance use. Patient expressed understanding.   Objective: Vital signs in last 24 hours: Vitals:   02/03/18 1109 02/03/18 1114 02/03/18 1119 02/03/18 1124  BP: (!) 128/92 (!) 125/92 (!) 127/92 (!) 128/91  Pulse: 88 84 84 95  Resp: 13 16 16 13   Temp:      TempSrc:      SpO2: 98% 97% 97% 97%  Weight:      Height:       Weight change:   Intake/Output Summary (Last 24 hours) at 02/03/2018 1136 Last data filed at 02/03/2018 6283 Gross per 24 hour  Intake 195.83 ml  Output 400 ml  Net -204.17 ml   Physical Exam  Constitutional: He is oriented to person, place, and time and well-developed, well-nourished, and in no distress.  HENT:  Head: Normocephalic and atraumatic.  Eyes: Pupils are equal, round, and reactive to light. No scleral icterus.  Neck: Normal range of motion. Neck supple.  Cardiovascular: Normal rate, regular rhythm and normal heart sounds.  No murmur heard. Pulmonary/Chest: Effort normal and breath sounds normal.  Abdominal: Soft. Bowel sounds are normal. There is no tenderness.  Musculoskeletal: Normal range of motion.  Neurological: He is alert and oriented to person, place, and time.  Skin: Skin is warm. No rash noted.   Lab Results: @LABTEST2 @ Micro Results: No results found for this or any previous visit (from the past 240 hour(s)). Studies/Results: No results found. Medications: I have reviewed the patient's current medications. Scheduled Meds: . [MAR Hold] amLODipine  10 mg Oral Daily  . [MAR Hold] aspirin EC  81 mg Oral Daily  . [MAR Hold] atorvastatin  40 mg Oral q1800  . [MAR Hold] DULoxetine  30 mg Oral BID  . [MAR Hold] enoxaparin (LOVENOX) injection  40 mg Subcutaneous Q24H    . [MAR Hold] folic acid  1 mg Oral Daily  . [MAR Hold] gabapentin  300 mg Oral BID  . [MAR Hold] hydrochlorothiazide  12.5 mg Oral Daily  . [MAR Hold] Influenza vac split quadrivalent PF  0.5 mL Intramuscular Tomorrow-1000  . [MAR Hold] insulin aspart  0-15 Units Subcutaneous TID WC  . [MAR Hold] paliperidone  3 mg Oral Daily  . [MAR Hold] pantoprazole  40 mg Oral Daily  . [MAR Hold] pneumococcal 23 valent vaccine  0.5 mL Intramuscular Tomorrow-1000  . sodium chloride flush  3 mL Intravenous Q12H  . [MAR Hold] thiamine  100 mg Oral Daily   Continuous Infusions: . sodium chloride    . sodium chloride 50 mL/hr at 02/03/18 0651   PRN Meds:.sodium chloride, [MAR Hold] acetaminophen, [MAR Hold] hydrALAZINE, [MAR Hold] LORazepam, [MAR Hold] polyethylene glycol, sodium chloride flush Assessment/Plan: Principal Problem:   Chest pain Active Problems:   Cocaine abuse with cocaine-induced mood disorder (HCC)   Hypertensive urgency   Acute systolic heart failure [facility])  [name] isa 57 yo malewith a significant PMH of HTN., T2DM, depression,schizoaffective disorder, hx of CVA, polysubstance abuse(cocaine and alcohol) and medication noncompliancewho presented to the EDfor evaluation of a1 week history ofatypicalchest painand found to have hypertensive urgency (178/110) with elevated troponin (0.03) on admission. Low suspicion for ACS due to stable EKG without acute ST changes and minimal  troponin elevation. CP most likely 2/2 to hyperadrenergic state and increased demand caused by BP med noncomplianceand abrupt cessation of clonidine patch. 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 Per cards, ECHO findings concerning for possible ischemic cause of the acute on chronic HF exacerbation so plan to do a left heart cath today. LHC results show moderate increase in LVED but no aortic stenosis or CAD. Cards  recommend continued medical management, including diuresis. Patient's use of chronic cocaine abuse or uncontrolled HTN in the setting of medication noncompliance could be contributing to his HF. No edema on exam and new AKI so will hold off on diuresing patient today. Will consider tomorrow based on Cr level. 10-year ASCVD risk of 23.7% based on BP of 111/82 and lipid panel from 01/19. High intensity statin started yesterday. -Cardio consulted, appreciate recs -Continue daily weights -Strict I/Os -Continue Atorvastatin 40 MG daily -Continue ASA 81 MG daily -Start PT/OT eval  AKI Cr increased from 0.92 on admission to 1.3 today. Kidney injury possibly 2/2 to recent use of lisinopril. Will discontinue lisinopril at this time and follow Cr levels. -D/c lisinopril -Trend Cr  Hypertensive Urgency BP much improved. 131/97 this AM. With new finding of AKI, and possible further kidney injury from contrast for LHC will stop lisinopril. Will continue amlodipine and HCTZ.  -Continue home amlodipine 10 MG daily -Continue HCTZ 12.5 MG daily -Stop Lisinopril as above -PRN hydralazine 10 MG Q6H for sBP >180  Chest/Epigastricpain Denies any CP this AM. Atypical CP presentation possibly 2/2 to cocaine induced CP or gastritis from alcohol use. Repeat EKG without evidence of acute ischemic changes. Troponin stable at 0.04. No family or personal hx of MI. Mostly likely etiology is demand ischemia 2/2 hypertensive urgency in the context of noncompliance with home BP meds. Will continue evaluating for signs of acute MI -Telemetry -Pantoprazole 40 MG PO daily  Polysubstance use disorder No signs of withdraw this AM. CIWA 0 this AM. Long hx of cocaine, alcohol, tobacco abuse. On admission found to have BAL of 66 and UDS positive for cocaine and benzos. Normal salicylate level with acetominophen level <10. Received Ativan in the ED and has not received any since then. Reports significant daily alcohol use with  hx of withdrawal symptoms. Last ETOH was 3/15, will monitor for withdraw symptoms.  -CIWA with Ativan per protocol -Folic acid 1 MGIV daily -Thiamine 100 MG IV daily  T2DM CBG 147-211 in last 24 hrs. Uncontrolled due to medication noncompliant. Home meds include lantus, humalog and metformin. Last prescription on 05/27/2017 but unclear last use. Last A1C is 8.6 from 11/24/2017.  -SSI-M TID w/ meals -Monitor CBGs -Hypoglycemia protocol  Hypokalemia Improving. K of 3.5 this AM. No current symptoms. Will continue to replete and monitor. -PO K 81mEq x1 -BMP in AM  Depression/Schizoaffective disorder Hx of SI/HI, unsuccessful suicide attempts and hallucinations with multiple ED visits and hospitalizations. Last visit for SI and auditory hallucination on 12/20/2017. Sees [name] for treatment but did not follow up for 9 months until today. Previously on paliperidone and duloxetine. Has been cleared by psych. They recommend he follow up with [facility] for med management and polysubstance use treatment. Patient advised to follow up after discharge. -Psych consulted, appreciate recs -Continue paliperidone 3 MG daily, per psych recs -Continue outpatient f/u at [facility] gabapentin 300 MG BID, per psych recs -Continue home duloxetine 30 MG PO BID  Homelessness Patient has  been homeless for the last 2 months. Has been getting assistance from [place]. Will consult SW to assist with housing. - Social work consulted, appreciate recs  FEN/GI -Heart healthy/Carb modified  VTE Prophylaxis: Lovenox  Code Status:Full   Dispo: admit for inpatient observation until medically stable.  This is a Careers information officer Note.  The care of the patient was discussed with Dr. Ronalee Red and the assessment and plan formulated with their assistance.  Please see their attached note for official documentation of the daily encounter.   LOS: 1 day   Lacinda Axon, Medical Student 02/03/2018, 11:36 AM

## 2018-02-03 NOTE — Progress Notes (Signed)
PT Cancellation Note  Patient Details Name: [name] MRN: [id number] DOB: 1961/06/10   Cancelled Treatment:    Reason Eval/Treat Not Completed: Medical issues which prohibited therapy Patient received L heart cath procedure earlier today, per chart review appears to still be on bedrest following procedure. Plan to hold until patient is medically ready to participate in [place] [facility], DPT, CBIS  Supplemental Physical Therapist [facility]   Pager [phone]

## 2018-02-03 NOTE — Interval H&P Note (Signed)
Cath Lab Visit (complete for each Cath Lab visit)  Clinical Evaluation Leading to the Procedure:   ACS: Yes.    Non-ACS:    Anginal Classification: CCS IV  Anti-ischemic medical therapy: Minimal Therapy (1 class of medications)  Non-Invasive Test Results: No non-invasive testing performed  Prior CABG: No previous CABG      History and Physical Interval Note:  02/03/2018 10:57 AM  [name]  has presented today for surgery, with the diagnosis of cp  The various methods of treatment have been discussed with the patient and family. After consideration of risks, benefits and other options for treatment, the patient has consented to  Procedure(s): LEFT HEART CATH AND CORONARY ANGIOGRAPHY (N/A) as a surgical intervention .  The patient's history has been reviewed, patient examined, no change in status, stable for surgery.  I have reviewed the patient's chart and labs.  Questions were answered to the patient's satisfaction.     Larae Grooms

## 2018-02-03 NOTE — Progress Notes (Addendum)
Subjective:  [name] was seen lying in bed comfortably this morning. He denies current chest pain or shortness of breath. Denies HA or lightheadedness. LHC today showed elevated LVEDP, no aortic stenosis, no significant CAD.  Objective:  Vital signs in last 24 hours: Vitals:   02/02/18 1756 02/02/18 2013 02/03/18 0014 02/03/18 0400  BP:  (!) 141/111 (!) 136/97 (!) 132/92  Pulse:  75 84 76  Resp:  (!) 21 18 18   Temp:  98 F (36.7 C) (!) 97.5 F (36.4 C) 98.1 F (36.7 C)  TempSrc:  Oral Oral Oral  SpO2:  94% 100% 98%  Weight: 190 lb 14.4 oz (86.6 kg)  191 lb (86.6 kg) 191 lb (86.6 kg)  Height: 5\' 11"  (1.803 m)      GEN: Well-nourished male lying in bed. Appears tired. Alert. No acute distress. Mildly slurred speech. RESP: Clear to auscultation bilaterally. No wheezes, rales, or rhonchi. No increased work of breathing. CV: Normal rate and regular rhythm. No murmurs, gallops, or rubs. No LE edema. ABD: Soft. Non-tender. Non-distended. Normoactive bowel sounds. EXT: No edema. Warm and well perfused. NEURO: Cranial nerves II-XII grossly intact. Able to lift all four extremities against gravity. No apparent audiovisual hallucinations. Speech fluent and appropriate. No resting tremors noted. PSYCH: Patient is calm. Appropriate affect. Well-groomed; speech is appropriate and on-subject.  Assessment/Plan:  Principal Problem:   Chest pain Active Problems:   Cocaine abuse with cocaine-induced mood disorder (HCC)   Hypertensive urgency   Acute systolic heart failure [facility])  [name] is a 57yo male with PMH of HTN, schizoaffective disorder, depression, diabetes, hx of CVA, and polysubstance use disorder (including alcohol and cocaine) who presents with chest pain, found to be hypertensive to 170s/110s, likely secondary to noncompliance with home medications and rebound HTN from abrupt cessation of clonidine patch, which has improved with PO anti-hypertensives. Chest pain likely  secondary to demand in setting of hypertensive urgency with flat troponins at 0.03 and without ischemic changes on EKG.  Combined systolic and diastolic heart failure, NICM Echo obtained yesterday shows newly reduced EF at 40-45%, grade 2 DD, and hypokinesis of anterolateral, inferolateral, and inferior myocardium. LHC today showed elevated LVEDP, no aortic stenosis, and no significant CAD. Recommended continued medical therapy, including diuresis. Etiology for HF likely combination of cocaine use and uncontrolled hypertension. Euvolemic on exam. Would benefit from beta blocker, however he continues to use cocaine. 10-year ASCVD risk 31.5% based on lipid panel from 11/2017. High-intensity statin started yesterday. - Cardiology consulted; appreciate their assistance - Hold lisinopril 2/2 AKI - Daily weights - I/Os - Continue atorvastatin 40mg  daily - Continue ASA 81mg  daily, per Cardiology - Consider diuretics tomorrow, will hold for now secondary to AKI - PT/OT eval  AKI Cr increased from 0.93 on admission to 1.30 today, after initiation of lisinopril. Possibly secondary to lisinopril vs cardiorenal. Received contrast load with LHC today. Pre-hydrated. - D/c lisinopril - BMP in AM  Hypertension Presented with hypertensive urgency, now resolved with re-initiation of anti-hypertensives. Changed regimen to amlodipine 10mg  daily, lisinopril, and HCTZ. Lisinopril d/c'ed today due to AKI. Would also benefit from beta blocker given HFrEF, however he continues to use cocaine. BP well-controlled this morning at 132/92. - Continue home amlodipine 10mg  daily - D/c lisinopril 20mg  daily - Continue HCTZ 12.5mg  daily - can consider holding this as well if renal function continues to worsen - PO hydralazine 10mg  q6h PRN for sBP > 180  Atypical chest pain / Epigastric pain EKG  reassuring. Troponins flat at 0.03. Possibly gastritis secondary to alcohol use, cocaine-induced chest pain, or 2/2 hypertension.  Denies current chest pain. - Telemetry - Continue PO pantoprazole 40mg  daily  DM BG 188-211. Last A1c 8.6 in 11/2017. Home regimen includes metformin 500mg  BID, but has been off all medications for at least 3 weeks. - SSI-M TID WC - CBG monitoring  Hypokalemia K 3.5. Will replete - PO K 15mEq x1 - BMP in AM  Polysubstance use disorder Including tobacco, alcohol, and crack cocaine. Hx of withdrawal seizures and DTs. BAL 66 on admission, and UDS positive for benzos and cocaine. CIWA 1 -> 0, has not required Ativan in several days. No resting tremors on exam. - CIWA with Ativan per stepdown protocol - MVI, thiamine, folate  Depression Schizoaffective disorder Hx of SI/HI, hallucinations, and multiple ED visits/admissions for suicidal thoughts. Patient was previously on paliperidone 156mg  injections every 30 days. Has been off all medications for at least 1 month. Seen by Psych yesterday. Does not meet criteria for inpatient Psych. Will need to f/u with [facility] outpatient. - Psych consulted; appreciate their assistance - Continue home duloxetine 30mg  BID - Continue paliperidone 3mg  daily, per Psych recs - Continue gabapentin 300mg  BID, per Psych recs - SW consulted for assistance with referral to [facility]  Homelessness For the last 2 months. States that [facility] was supposed to provide him assistance with housing, however he had not been able to follow up on this. - SW consulted; appreciate their assistance  Dispo: Anticipated discharge in approximately 0-1 day(s).  Colbert Ewing, MD 02/03/2018, 6:56 AM Pager: Mamie Nick [phone]
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
## 2018-02-04 DIAGNOSIS — K2920 Alcoholic gastritis without bleeding: Secondary | ICD-10-CM

## 2018-02-04 LAB — BASIC METABOLIC PANEL
Anion gap: 9 (ref 5–15)
BUN: 12 mg/dL (ref 6–20)
CALCIUM: 8.5 mg/dL — AB (ref 8.9–10.3)
CHLORIDE: 101 mmol/L (ref 101–111)
CO2: 24 mmol/L (ref 22–32)
CREATININE: 1.04 mg/dL (ref 0.61–1.24)
Glucose, Bld: 270 mg/dL — ABNORMAL HIGH (ref 65–99)
Potassium: 3.8 mmol/L (ref 3.5–5.1)
SODIUM: 134 mmol/L — AB (ref 135–145)

## 2018-02-04 LAB — GLUCOSE, CAPILLARY
GLUCOSE-CAPILLARY: 175 mg/dL — AB (ref 65–99)
GLUCOSE-CAPILLARY: 225 mg/dL — AB (ref 65–99)

## 2018-02-04 MED ORDER — PALIPERIDONE ER 3 MG PO TB24: 3.0000 mg | ORAL_TABLET | Freq: Every day | ORAL | 0 refills | Status: DC

## 2018-02-04 MED ORDER — METFORMIN HCL 500 MG PO TABS: 500.0000 mg | ORAL_TABLET | Freq: Two times a day (BID) | ORAL | 0 refills | Status: DC

## 2018-02-04 MED ORDER — LISINOPRIL-HYDROCHLOROTHIAZIDE 10-12.5 MG PO TABS: 1.0000 | ORAL_TABLET | Freq: Every day | ORAL | 0 refills | Status: DC

## 2018-02-04 MED ORDER — AMLODIPINE BESYLATE 10 MG PO TABS: 10.0000 mg | ORAL_TABLET | Freq: Every day | ORAL | 0 refills | Status: DC

## 2018-02-04 MED ORDER — LISINOPRIL 40 MG PO TABS: 40.0000 mg | ORAL_TABLET | Freq: Every day | ORAL | Status: DC

## 2018-02-04 MED ORDER — DULOXETINE HCL 30 MG PO CPEP: 30.0000 mg | ORAL_CAPSULE | Freq: Two times a day (BID) | ORAL | 0 refills | Status: DC

## 2018-02-04 MED ORDER — GABAPENTIN 300 MG PO CAPS: 300.0000 mg | ORAL_CAPSULE | Freq: Three times a day (TID) | ORAL | 0 refills | Status: DC

## 2018-02-04 MED FILL — LISINOPRIL-HCTZ 10-12.5 MG: 10-12.5 | 30 days supply | Qty: 30 | Fill #0

## 2018-02-04 MED FILL — PALIPERIDONE ER 3 MG TABLET: 3 | 30 days supply | Qty: 30 | Fill #0

## 2018-02-04 MED FILL — AMLODIPINE BESYLATE 10 MG T: 10 | 30 days supply | Qty: 30 | Fill #0

## 2018-02-04 MED FILL — DULoxetine HCL 30 MG CPEP: 30 | 30 days supply | Qty: 60 | Fill #0

## 2018-02-04 MED FILL — metFORMIN HCL 500 MG TABS: 500 | 30 days supply | Qty: 60 | Fill #0

## 2018-02-04 MED FILL — GABAPENTIN 300 MG CAPSULE: 300 | 30 days supply | Qty: 90 | Fill #0

## 2018-02-04 MED FILL — Heparin Sodium (Porcine) 2 Unit/ML in Sodium Chloride 0.9%: INTRAMUSCULAR | Qty: 1000 | Status: AC
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
## 2018-02-04 NOTE — Evaluation (Signed)
Physical Therapy Evaluation Patient Details Name: [name] MRN: [id number] DOB: Jul 28, 1961 Today's Date: 02/04/2018   History of Present Illness  56yo male presenting with chest pain for the past 7 days, increasing with exertion and improving with rest. Diagnosed with chest pain, hypertensive urgency, polysubstance use disorder. PMH alcohol abuse, cocaine and polysubstance use disorder, DM, hx DTs, gout, schizophrenia, CVA, HTN   Clinical Impression   Patient received sleeping in bed but easily woken, pleasant and willing to participate in PT this afternoon. He is able to complete functional bed mobility with Mod(I) and extended time, but does require Min guard for functional transfers and gait. He was initially very unsteady with gait, but this improved as gait period continued, although he does appear to present as at least a moderate fall risk with mobility. Note varus positioning of knees today, patient reports ongoing foot and knee pain/swelling as well. He was left sitting at the side of the bed with all needs met this afternoon. He will continue to benefit from skilled PT services in the acute setting, and will also benefit from skilled OP PT services and the use of RW to reduce fall risk moving forward.    Follow Up Recommendations Outpatient PT    Equipment Recommendations  Rolling walker with 5" wheels    Recommendations for Other Services       Precautions / Restrictions Precautions Precautions: Fall Restrictions Weight Bearing Restrictions: No      Mobility  Bed Mobility Overal bed mobility: Modified Independent             General bed mobility comments: extended time   Transfers Overall transfer level: Needs assistance Equipment used: None Transfers: Sit to/from Stand Sit to Stand: Min guard         General transfer comment: min guard for safety due to gross unsteadiness   Ambulation/Gait Ambulation/Gait assistance: Min guard Ambulation  Distance (Feet): 220 Feet Assistive device: None Gait Pattern/deviations: Step-through pattern;Decreased dorsiflexion - right;Decreased dorsiflexion - left;Drifts right/left;Narrow base of support;Trunk flexed     General Gait Details: noted varus positioning of B knees, mildly antalgic pattern; generally unsteady but this improved as gait period continued  Stairs            Wheelchair Mobility    Modified Rankin (Stroke Patients Only)       Balance Overall balance assessment: Needs assistance Sitting-balance support: Bilateral upper extremity supported;Feet supported Sitting balance-Leahy Scale: Good     Standing balance support: During functional activity Standing balance-Leahy Scale: Fair                               Pertinent Vitals/Pain Pain Assessment: Faces Faces Pain Scale: Hurts a little bit Pain Location: B feet  Pain Descriptors / Indicators: Sore Pain Intervention(s): Limited activity within patient's tolerance;Monitored during session    Home Living Family/patient expects to be discharged to:: Unsure                 Additional Comments: currently homeless, reports he was living in the shelter, then a hotel paid for by someone at his church, but is now basically living in the street     Prior Function Level of Independence: Independent               Hand Dominance        Extremity/Trunk Assessment        Lower Extremity Assessment Lower Extremity Assessment: Generalized  weakness    Cervical / Trunk Assessment Cervical / Trunk Assessment: Normal  Communication   Communication: No difficulties  Cognition Arousal/Alertness: Awake/alert Behavior During Therapy: WFL for tasks assessed/performed Overall Cognitive Status: Within Functional Limits for tasks assessed                                        General Comments      Exercises     Assessment/Plan    PT Assessment Patient needs  continued PT services  PT Problem List Decreased strength;Decreased safety awareness;Decreased coordination;Decreased balance       PT Treatment Interventions DME instruction;Therapeutic activities;Gait training;Therapeutic exercise;Patient/family education;Stair training;Balance training;Functional mobility training;Neuromuscular re-education    PT Goals (Current goals can be found in the Care Plan section)  Acute Rehab PT Goals Patient Stated Goal: to get well PT Goal Formulation: With patient Time For Goal Achievement: 02/18/18 Potential to Achieve Goals: Fair    Frequency Min 3X/week   Barriers to discharge        Co-evaluation               AM-PAC PT "6 Clicks" Daily Activity  Outcome Measure Difficulty turning over in bed (including adjusting bedclothes, sheets and blankets)?: None Difficulty moving from lying on back to sitting on the side of the bed? : None Difficulty sitting down on and standing up from a chair with arms (e.g., wheelchair, bedside commode, etc,.)?: None Help needed moving to and from a bed to chair (including a wheelchair)?: A Little Help needed walking in hospital room?: A Little Help needed climbing 3-5 steps with a railing? : A Little 6 Click Score: 21    End of Session   Activity Tolerance: Patient tolerated treatment well Patient left: in bed;with call bell/phone within reach(sitting at EOB per his request )   PT Visit Diagnosis: Unsteadiness on feet (R26.81);Muscle weakness (generalized) (M62.81)    Time: 9774-1423 PT Time Calculation (min) (ACUTE ONLY): 13 min   Charges:   PT Evaluation $PT Eval Low Complexity: 1 Low     PT G Codes:        Deniece Ree PT, DPT, CBIS  Supplemental Physical Therapist [place]   Pager [phone]

## 2018-02-04 NOTE — Progress Notes (Signed)
Subjective: Patient was sitting in bed comfortably and in no distress this AM. He reports feeling fine and his feet feeling better than it had in the past. He denied any CP or SOB. He requested to see care manager to help him find a place to stay so he does not end up in the streets. Patient was informed about his possible discharge today and advised to stop the polysubstance use, take his medicines and touch base with [name]. Results of the LHC was also explained to patient.   Objective: Vital signs in last 24 hours: Vitals:   02/04/18 0005 02/04/18 0343 02/04/18 0739 02/04/18 1103  BP: (!) 141/116 (!) 167/113 (!) 156/125 (!) 146/108  Pulse: 76   90  Resp: 19 17 19 19   Temp: 98.2 F (36.8 C) 98 F (36.7 C)    TempSrc: Oral Oral    SpO2: 99%   100%  Weight:  193 lb 9.6 oz (87.8 kg)    Height:       Weight change: -6.5 oz (-0.184 kg)  Intake/Output Summary (Last 24 hours) at 02/04/2018 1513 Last data filed at 02/04/2018 1147 Gross per 24 hour  Intake 240 ml  Output 2000 ml  Net -1760 ml   Physical Exam  Constitutional: He is oriented to person, place, and time.  HENT:  Head: Normocephalic and atraumatic.  Eyes: Pupils are equal, round, and reactive to light.  Neck: Normal range of motion. Neck supple.  Cardiovascular: Normal rate, regular rhythm and normal heart sounds.  Pulmonary/Chest: Effort normal and breath sounds normal. He has no wheezes.  Abdominal: Soft. Bowel sounds are normal. There is no tenderness.  Musculoskeletal: Normal range of motion.  Neurological: He is alert and oriented to person, place, and time.  Skin: Skin is warm. No rash noted.  Psychiatric: Mood and affect normal.   Lab Results: @LABTEST2 @ Micro Results: No results found for this or any previous visit (from the past 240 hour(s)). Studies/Results: No results found. Medications: I have reviewed the patient's current medications. Scheduled Meds: . amLODipine  10 mg Oral Daily  . aspirin EC   81 mg Oral Daily  . atorvastatin  40 mg Oral q1800  . DULoxetine  30 mg Oral BID  . enoxaparin (LOVENOX) injection  40 mg Subcutaneous Q24H  . folic acid  1 mg Oral Daily  . gabapentin  300 mg Oral BID  . hydrochlorothiazide  12.5 mg Oral Daily  . Influenza vac split quadrivalent PF  0.5 mL Intramuscular Tomorrow-1000  . insulin aspart  0-15 Units Subcutaneous TID WC  . lisinopril  40 mg Oral Daily  . paliperidone  3 mg Oral Daily  . pantoprazole  40 mg Oral Daily  . pneumococcal 23 valent vaccine  0.5 mL Intramuscular Tomorrow-1000  . sodium chloride flush  3 mL Intravenous Q12H  . thiamine  100 mg Oral Daily   Continuous Infusions: . sodium chloride     PRN Meds:.sodium chloride, acetaminophen, acetaminophen, hydrALAZINE, LORazepam, ondansetron (ZOFRAN) IV, polyethylene glycol, sodium chloride flush Assessment/Plan: Principal Problem:   Chest pain Active Problems:   Cocaine abuse with cocaine-induced mood disorder (HCC)   Hypertensive urgency   Acute systolic heart failure [facility])  [name] isa 57 yo malewith a significant PMH of HTN., T2DM, depression,schizoaffective disorder, hx of CVA, polysubstance abuse(cocaine and alcohol) and medication noncompliancewho presented to the EDfor evaluation of a1 week history ofatypicalchest painand found to have hypertensive urgency (178/110) with elevated troponin (0.03) on admission. Low suspicion for ACS  due to stable EKG without acute ST changes and minimal troponin elevation. CP most likely 2/2 to hyperadrenergic state and increased demand caused by BP med noncomplianceand abrupt cessation of clonidine patch. 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 LHC on 3/19 show moderate increase in LVEDP but no aortic stenosis or significant CAD. Cards recommend continued medical management, including diuresis. Patient's use of chronic cocaine abuse or  uncontrolled HTN in the setting of medication noncompliance most likely cause of his HF. No edema on exam and AKI has resolved. 10-year ASCVD risk of 23.7% based on BP of 111/82 and lipid panel from 01/19. High intensity statin started yesterday. Cards signed off on patient and recommending lisinopril to treat still elevated BP. Patient can follow up with them.  -Start lisinopril 10 MG daily, per cards -Continue daily weights -Strict I/Os -Continue Atorvastatin 40 MG daily -Continue ASA 81 MG daily -F/u with Cards outpatient -F/u with PCP, appt on April 2nd.  AKI Resolved. Cr 1.04. Due to recent exposure contrast and lisinopril-induced AKI, will keep him off lisinopril for a few days.  -Avoid nephrotoxic agents  Hypertension 146/108 this AM. Improving, but still elevated. With recent episode of AKI, will keep patient off lisinopril for a few days. Will increase dose of amlodipine and HCTZ on discharge. -Continue home amlodipine 10 MG daily -Continue HCTZ 12.5 MG daily -PRN hydralazine 10 MG Q6H for sBP >180 -Emphasize the importance of medication compliance.  Chest/Epigastricpain No CP this AM. AtypicalCP presentationpossibly 2/2 to cocaine induced CP or gastritis from alcohol use. Repeat EKG without evidence ofacute ischemic changes. Troponin stable at 0.04. No family or personal hx of MI. Mostly likely etiology is demand ischemia 2/2 hypertensive urgency in the context of noncompliance with home BP meds. Will continue evaluating for signs of acute MI -Telemetry -Pantoprazole 40 MG PO daily  Polysubstance use disorder No signs of withdraw. CIWA 0 this AM.Long hx of cocaine, alcohol, tobacco abuse. On admission found to have BAL of 66 and UDS positive for cocaine and benzos. Normal salicylate level with acetominophen level <10. Received Ativan in the [place] has not received any since then.Reports significant daily alcohol use with hx of withdrawal symptoms. Last ETOH was 3/15,  will monitor for withdraw symptoms.  -CIWA with Ativan per protocol -Folic acid 1 MGIV daily -Thiamine 100 MG IV daily -Advise to stop alcohol and cocaine abuse  T2DM CBG 155-225 in last 24 hrs. Uncontrolled due to medication noncompliant. Home meds include lantus, humalog and metformin. Last prescription on 05/27/2017 but unclear last use. Last A1C is 8.6 from 11/24/2017.  -SSI-M TID w/ meals -Monitor CBGs -Hypoglycemia protocol  Hypokalemia Resolved. K+ of 3.8 this AM. No current symptoms.   Depression/Schizoaffective disorder Denies any HI/SI or hallucination, Per psych, patient initially reported SI/HI so he could get admitted. Hx of SI/HI, unsuccessful suicide attempts and hallucinations with multiple ED visits and hospitalizations. Last visit for SI and auditory hallucination on 12/20/2017. Sees [name] for treatment but did not follow up for 9 months until today. Previously on paliperidone and duloxetine.Has been cleared by psych. They recommend he [facility] with [facility] for med management and polysubstance use treatment. Patient advised to follow up after discharge. -Psych consulted, appreciate recs -Continue paliperidone 3 MG daily, per psych recs -Continue outpatient f/u at [facility] gabapentin 300 MG BID, per psych recs -Continue home duloxetine 30 MG PO BID  Homelessness Patient concern about  being homeless when he is discharged. Care manager working with patient. Patient has been homeless for the last 2 months. Has been getting assistance from [place]. - Social work consulted, appreciate recs  FEN/GI -Heart healthy/Carb modified  VTE Prophylaxis: Lovenox  Code Status:Full   Dispo: Anticipated discharge in 0-1 days  This is a Careers information officer Note.  The care of the patient was discussed with Dr. Ronalee Red and the assessment and plan formulated with their assistance.  Please see their attached note for official documentation of the daily encounter.   LOS:  2 days   Lacinda Axon, Medical Student 02/04/2018, 3:13 PM

## 2018-02-04 NOTE — Progress Notes (Addendum)
Subjective:  [name] was seen lying in bed comfortably this morning. He reports his chest pain is improved. Denies shortness of breath. Does endorse some HA, relieved by tylenol. Patient aware that [place] yesterday did not show any blockages. Patient requests assistance with medications, bus passes, and homelessness, asking for SW help.  Objective:  Vital signs in last 24 hours: Vitals:   02/03/18 2025 02/04/18 0005 02/04/18 0343 02/04/18 0739  BP: (!) 148/105 (!) 141/116 (!) 167/113 (!) 156/125  Pulse: 83 76    Resp: (!) 27 19 17 19   Temp:  98.2 F (36.8 C) 98 F (36.7 C)   TempSrc:  Oral Oral   SpO2: 100% 99%    Weight:   193 lb 9.6 oz (87.8 kg)   Height:       GEN: Well-nourished male lying in bed. Appears tired. Alert. No acute distress. Mildly slurred speech. RESP: Clear to auscultation bilaterally. No wheezes, rales, or rhonchi. No increased work of breathing. CV: Normal rate and regular rhythm. No murmurs, gallops, or rubs. No LE edema. ABD: Soft. Non-tender. Non-distended. Normoactive bowel sounds. EXT: No edema. Warm and well perfused. NEURO: Cranial nerves II-XII grossly intact. Able to lift all four extremities against gravity. No apparent audiovisual hallucinations. Speech fluent and appropriate. No resting tremors noted. PSYCH: Patient is calm. Appropriate affect. Well-groomed; speech is appropriate and on-subject.  Assessment/Plan:  Principal Problem:   Chest pain Active Problems:   Cocaine abuse with cocaine-induced mood disorder (HCC)   Hypertensive urgency   Acute systolic heart failure [name])  [name] is a 57yo male with PMH of HTN, schizoaffective disorder, depression, diabetes, hx of CVA, and polysubstance use disorder (including alcohol and cocaine) who presented with hypertensive urgency, secondary to noncompliance with home medications, now improved after initiation of PO medications. Chest pain likely secondary to demand in setting of  hypertensive urgency with flat troponins at 0.03 and without ischemic changes on EKG. TTE showed newly reduced EF 40-45% and grade 2 DD. [place] 3/19 showed no significant CAD.  Combined systolic and diastolic heart failure, NICM (LVEF 40-45%, grade 2 DD) LHC 3/19 showed elevated LVEDP, no aortic stenosis, and no significant CAD. Recommended continued medical therapy, including diuresis. Etiology for HF likely combination of cocaine use and uncontrolled hypertension. Euvolemic on exam. Would benefit from beta blocker, however he continues to use cocaine.  - Cardiology consulted; appreciate their assistance - Start lisinopril 40mg  once a day, per Cards - Avoid beta blockers given cocaine use - Daily weights - I/Os - Continue atorvastatin 40mg  daily - Continue ASA 81mg  daily, per Cardiology - PT/OT eval - F/u with Cards - F/u with PCP, scheduled for April 2 - Will d/c with lisinopril-HCTZ 10-12.5mg  once a day with plan to f/u with PCP for uptitration  AKI, resolved Cr peaked at 1.30 yesterday, improved this morning to 1.04 after holding lisinopril. - BMP in AM - Will d/c on lower dose of lisinopril to ensure renal function remains stable  Hypertension Presented with hypertensive urgency, now resolved with re-initiation of anti-hypertensives. Changed regimen to amlodipine 10mg  daily, lisinopril, and HCTZ. Lisinopril d/c'ed today due to AKI. Would also benefit from beta blocker given HFrEF, however he continues to use cocaine. BP slightly worsened this morning, 167/113. Had to hold lisinopril yesterday due to AKI. - Continue home amlodipine 10mg  daily - Start lisinopril 40mg  daily - Continue HCTZ 12.5mg  daily - can consider holding this as well if renal function continues to worsen - PO hydralazine 10mg   q6h PRN for sBP > 180 - Re-emphasized importance of taking all prescribed meds, following up with outpatient providers, and avoiding cocaine and alcohol use  Atypical chest pain / Epigastric  pain EKG reassuring. Troponins flat at 0.03. Possibly gastritis secondary to alcohol use, cocaine-induced chest pain, or 2/2 hypertension. No significant CAD on LHC. Reports chest pain has improved. - Telemetry - Continue PO pantoprazole 40mg  daily  DM BG 155-270. Last A1c 8.6 in 11/2017. Home regimen includes metformin 500mg  BID, but has been off all medications for at least 3 weeks. - SSI-M TID WC - CBG monitoring  Hypokalemia K 3.5. Will replete - PO K 37mEq x1 - BMP in AM  Polysubstance use disorder Including tobacco, alcohol, and crack cocaine. Hx of withdrawal seizures and DTs. BAL 66 on admission, and UDS positive for benzos and cocaine. CIWA 0, has not required Ativan in several days. No resting tremors on exam. - CIWA with Ativan per stepdown protocol - MVI, thiamine, folate - Continue to encourage alcohol and cocaine cessation  Depression Schizoaffective disorder Hx of SI/HI, hallucinations, and multiple ED visits/admissions for suicidal thoughts. Patient was previously on paliperidone 156mg  injections every 30 days. Has been off all medications for at least 1 month. Evaluated by Psych. Does not meet criteria for inpatient Psych. Will need to f/u with [facility] outpatient. - Psych consulted; appreciate their assistance - Continue home duloxetine 30mg  BID - Continue paliperidone 3mg  daily, per Psych recs - Continue gabapentin 300mg  BID, per Psych recs - SW consulted for assistance with referral to [facility] - Patient encouraged to f/u with [facility]  Homelessness For the last 2 months. States that [facility] was supposed to provide him assistance with housing, however he had not been able to follow up on this. - SW consulted; appreciate their assistance - Case management consulted for assistance in obtaining medications prior to discharge  Dispo: Anticipated discharge in approximately 0-1 day(s).  Colbert Ewing, MD 02/04/2018, 7:45 AM Pager: Mamie Nick [phone]

## 2018-02-04 NOTE — Clinical Social Work Note (Signed)
Clinical Social Work Assessment  Patient Details  Name: [name] MRN: [id number] Date of Birth: October 13, 1961  Date of referral:  02/04/18               Reason for consult:  Discharge Planning, Facility Placement                Permission sought to share information with:  Family Supports Permission granted to share information::  Yes, Verbal Permission Granted  Name::     Quamere Mussell  Agency::     Relationship::  mother  Contact Information:  [phone]  Housing/Transportation Living arrangements for the past 2 months:  [place] of Information:  Patient Patient Interpreter Needed:  None Criminal Activity/Legal Involvement Pertinent to Current Situation/Hospitalization:  No - Comment as needed Significant Relationships:  None Lives with:  Self Do you feel safe going back to the place where you live?  No Need for family participation in patient care:  Yes (Comment)  Care giving concerns:  No family at bedside. Patient stated he is homeless and has been at a shelter in the past but stated he is not sure if he wants to go back   Facilities manager / plan:  CSW met patient at bedside. Patient had requested assistance with transportation. Patient was given a bus voucher. Patient stated he wanted some assistance in being placed into a hotel room for a while until he can find stable housing. CSW explained that unfortunately the hospital does not have the resources to be able to do that. CSW stated she has a list of shelters in the area that he may qualify for. Patient stated he did not want the resources. CSW asked where patient is planning on going after discharge and he stated he wants to find his friend who is homeless and hang out with him. Patient was not interested in resources offered by CSW but only wanted a bus pass.  Employment status:  Unemployed Forensic scientist:  Medicaid In [place] PT Recommendations:  Not assessed at this  time Information / Referral to community resources:  Other (Comment Required)(Homeless shelter)  Patient/Family's Response to care:  Patient thanked CSW role in his care   Patient/Family's Understanding of and Emotional Response to Diagnosis, Current Treatment, and Prognosis:  Patient stated he has family but stated "they are annoying and did not like being around them". Patient stated he just wants to find his friend "since his a cool guy"  Emotional Assessment Appearance:  Appears stated age Attitude/Demeanor/Rapport:  Engaged Affect (typically observed):  Accepting Orientation:  Oriented to Self, Oriented to Place, Oriented to  Time, Oriented to Situation Alcohol / Substance use:  Illicit Drugs, Alcohol Use Psych involvement (Current and /or in the community):  No (Comment)  Discharge Needs  Concerns to be addressed:  Homelessness, Lack of Support Readmission within the last 30 days:  No Current discharge risk:  Homeless, Lack of support system Barriers to Discharge:  Active Substance Use, Other(homeless)   Wende Neighbors, LCSW 02/04/2018, 4:28 PM

## 2018-02-04 NOTE — Progress Notes (Signed)
Progress Note  Patient Name: [name] Date of Encounter: 02/04/2018  Primary Cardiologist: Dr. Debara Pickett   Subjective   No Chest pain or dyspnea. Encouraged compliance.   Inpatient Medications    Scheduled Meds: . amLODipine  10 mg Oral Daily  . aspirin EC  81 mg Oral Daily  . atorvastatin  40 mg Oral q1800  . DULoxetine  30 mg Oral BID  . enoxaparin (LOVENOX) injection  40 mg Subcutaneous Q24H  . folic acid  1 mg Oral Daily  . gabapentin  300 mg Oral BID  . hydrochlorothiazide  12.5 mg Oral Daily  . Influenza vac split quadrivalent PF  0.5 mL Intramuscular Tomorrow-1000  . insulin aspart  0-15 Units Subcutaneous TID WC  . paliperidone  3 mg Oral Daily  . pantoprazole  40 mg Oral Daily  . pneumococcal 23 valent vaccine  0.5 mL Intramuscular Tomorrow-1000  . sodium chloride flush  3 mL Intravenous Q12H  . thiamine  100 mg Oral Daily   Continuous Infusions: . sodium chloride     PRN Meds: sodium chloride, acetaminophen **OR** acetaminophen, acetaminophen, hydrALAZINE, LORazepam, ondansetron (ZOFRAN) IV, polyethylene glycol, sodium chloride flush   Vital Signs    Vitals:   02/03/18 2025 02/04/18 0005 02/04/18 0343 02/04/18 0739  BP: (!) 148/105 (!) 141/116 (!) 167/113 (!) 156/125  Pulse: 83 76    Resp: (!) 27 19 17 19   Temp:  98.2 F (36.8 C) 98 F (36.7 C)   TempSrc:  Oral Oral   SpO2: 100% 99%    Weight:   193 lb 9.6 oz (87.8 kg)   Height:        Intake/Output Summary (Last 24 hours) at 02/04/2018 0912 Last data filed at 02/04/2018 0700 Gross per 24 hour  Intake 480 ml  Output 1500 ml  Net -1020 ml   Filed Weights   02/03/18 0014 02/03/18 0400 02/04/18 0343  Weight: 191 lb (86.6 kg) 191 lb (86.6 kg) 193 lb 9.6 oz (87.8 kg)    Telemetry    SR- Personally Reviewed  ECG    N/A  Physical Exam   GEN: No acute distress.   Neck: No JVD Cardiac: RRR, no murmurs, rubs, or gallops. No R radial hematoma.  Respiratory: Clear to auscultation  bilaterally. GI: Soft, nontender, non-distended  MS: No edema; No deformity. Neuro:  Nonfocal  Psych: Normal affect   Labs    Chemistry Recent Labs  Lab 01/31/18 0405  02/02/18 0650 02/03/18 0242 02/04/18 0246  NA 139   < > 137 134* 134*  K 3.5   < > 3.3* 3.5 3.8  CL 104   < > 102 100* 101  CO2 21*   < > 24 23 24   GLUCOSE 265*   < > 169* 206* 270*  BUN 8   < > 14 18 12   CREATININE 0.92   < > 1.15 1.30* 1.04  CALCIUM 8.8*   < > 8.9 8.8* 8.5*  PROT 6.8  --   --   --   --   ALBUMIN 3.2*  --   --   --   --   AST 69*  --   --   --   --   ALT 28  --   --   --   --   ALKPHOS 68  --   --   --   --   BILITOT 0.5  --   --   --   --  GFRNONAA >60   < > >60 >60 >60  GFRAA >60   < > >60 >60 >60  ANIONGAP 14   < > 11 11 9    < > = values in this interval not displayed.     Hematology Recent Labs  Lab 01/31/18 0405 02/01/18 0252 02/03/18 0242  WBC 5.4 6.5 6.1  RBC 4.25 4.82 4.54  HGB 12.8* 14.3 13.6  HCT 39.5 43.7 42.6  MCV 92.9 90.7 93.8  MCH 30.1 29.7 30.0  MCHC 32.4 32.7 31.9  RDW 13.4 13.1 13.7  PLT 168 160 159    Cardiac Enzymes Recent Labs  Lab 01/31/18 0405 01/31/18 0601 01/31/18 1240 01/31/18 1816  TROPONINI 0.03* 0.03* 0.04* 0.04*   No results for input(s): TROPIPOC in the last 168 hours.   BNP Recent Labs  Lab 01/31/18 0730  BNP 482.3*     DDimer No results for input(s): DDIMER in the last 168 hours.   Radiology    No results found.  Cardiac Studies   Echo 02/01/18 Study Conclusions  - Left ventricle: The cavity size was normal. There was moderate   concentric hypertrophy. Systolic function was mildly to   moderately reduced. The estimated ejection fraction was in the   range of 40% to 45%. Hypokinesis of the anterolateral,   inferolateral, and inferior myocardium. Features are consistent   with a pseudonormal left ventricular filling pattern, with   concomitant abnormal relaxation and increased filling pressure   (grade 2 diastolic  dysfunction). - Aortic valve: Transvalvular velocity was within the normal range.   There was no stenosis. There was trivial regurgitation. - Mitral valve: Transvalvular velocity was within the normal range.   There was no evidence for stenosis. There was trivial   regurgitation. Valve area by pressure half-time: 1.91 cm^2. - Right ventricle: The cavity size was normal. Wall thickness was   normal. Systolic function was normal. - Tricuspid valve: There was no regurgitation. - Pulmonic valve: Transvalvular velocity was within the normal   range. There was no evidence for stenosis. There was trivial   regurgitation.  LEFT HEART CATH AND CORONARY ANGIOGRAPHY 02/03/18  Conclusion     LV end diastolic pressure is moderately elevated.  There is no aortic valve stenosis.  No significant CAD.   Nonischemic cardiomyopathy.  Continue medical therapy including diuresis.      Patient Profile     57 y.o. male with a hx of HTN,tobacco use,schizoaffective disorder, depression, DM 2, history of CVA and polysubstance use disorderwho is being seen today for the evaluation of chest pain and new decreased LVEF(down to 40-45% from 55-60% in 2016)at the request ofDr. Ronalee Red.   Assessment & Plan    1. Acute combined CHF/NICM - Newly reduced LVEF as above. Cath showed no CAD. Likely hypertensive cardiomyopathy in setting of non compliance.  - Net I & O 1.8L. Weight has been stable. Elevated LV end diastolic pressure by cath. euvolemic on exam.  - No BB due to cocaine use. He is on HCTZ 12.5mg  daily. Likely needs higher dose. ACE held due to elevated scr, now normalized.  - HF education given with cut back on salt.   2. Hypertensive heart disease - Encouraged compliance.   3. AKI - SCr stable now.  MD to see later today. Please give recommends for discharge medications for heart failure therapy.   For questions or updates, please contact [place] Please consult [email or web address]  for contact info under Cardiology/STEMI.      Signed,  [place], [place]  02/04/2018, 9:12 AM

## 2018-02-05 ENCOUNTER — Encounter (HOSPITAL_COMMUNITY): Payer: Self-pay

## 2018-02-05 ENCOUNTER — Emergency Department (HOSPITAL_COMMUNITY)
Admission: EM | Admit: 2018-02-05 | Discharge: 2018-02-05 | Disposition: A | Discharge status: Home / Self Care | Payer: Medicaid Other | Attending: Emergency Medicine | Admitting: Emergency Medicine

## 2018-02-05 DIAGNOSIS — F1010 Alcohol abuse, uncomplicated: Secondary | ICD-10-CM | POA: Insufficient documentation

## 2018-02-05 DIAGNOSIS — F17210 Nicotine dependence, cigarettes, uncomplicated: Secondary | ICD-10-CM | POA: Insufficient documentation

## 2018-02-05 DIAGNOSIS — Z7984 Long term (current) use of oral hypoglycemic drugs: Secondary | ICD-10-CM | POA: Diagnosis not present

## 2018-02-05 DIAGNOSIS — Z79899 Other long term (current) drug therapy: Secondary | ICD-10-CM | POA: Diagnosis not present

## 2018-02-05 DIAGNOSIS — R443 Hallucinations, unspecified: Secondary | ICD-10-CM

## 2018-02-05 DIAGNOSIS — Y905 Blood alcohol level of 100-119 mg/100 ml: Secondary | ICD-10-CM | POA: Insufficient documentation

## 2018-02-05 DIAGNOSIS — E119 Type 2 diabetes mellitus without complications: Secondary | ICD-10-CM | POA: Diagnosis not present

## 2018-02-05 DIAGNOSIS — R45851 Suicidal ideations: Secondary | ICD-10-CM | POA: Diagnosis not present

## 2018-02-05 DIAGNOSIS — I10 Essential (primary) hypertension: Secondary | ICD-10-CM | POA: Insufficient documentation

## 2018-02-05 DIAGNOSIS — R440 Auditory hallucinations: Secondary | ICD-10-CM | POA: Insufficient documentation

## 2018-02-05 DIAGNOSIS — I428 Other cardiomyopathies: Secondary | ICD-10-CM

## 2018-02-05 LAB — SALICYLATE LEVEL

## 2018-02-05 LAB — COMPREHENSIVE METABOLIC PANEL
ALK PHOS: 66 U/L (ref 38–126)
ALT: 15 U/L — AB (ref 17–63)
AST: 28 U/L (ref 15–41)
Albumin: 3.9 g/dL (ref 3.5–5.0)
Anion gap: 12 (ref 5–15)
BUN: 10 mg/dL (ref 6–20)
CALCIUM: 9.3 mg/dL (ref 8.9–10.3)
CO2: 25 mmol/L (ref 22–32)
CREATININE: 0.8 mg/dL (ref 0.61–1.24)
Chloride: 104 mmol/L (ref 101–111)
GFR calc non Af Amer: >60 mL/min (ref >60)
Glucose, Bld: 256 mg/dL — ABNORMAL HIGH (ref 65–99)
Potassium: 3.9 mmol/L (ref 3.5–5.1)
Sodium: 141 mmol/L (ref 135–145)
Total Bilirubin: 0.2 mg/dL — ABNORMAL LOW (ref 0.3–1.2)
Total Protein: 8.3 g/dL — ABNORMAL HIGH (ref 6.5–8.1)

## 2018-02-05 LAB — CBC
HEMATOCRIT: 43.1 % (ref 39.0–52.0)
Hemoglobin: 14 g/dL (ref 13.0–17.0)
MCH: 30.6 pg (ref 26.0–34.0)
MCHC: 32.5 g/dL (ref 30.0–36.0)
MCV: 94.1 fL (ref 78.0–100.0)
Platelets: 151 10*3/uL (ref 150–400)
RBC: 4.58 MIL/uL (ref 4.22–5.81)
RDW: 13 % (ref 11.5–15.5)
WBC: 6.3 10*3/uL (ref 4.0–10.5)

## 2018-02-05 LAB — RAPID URINE DRUG SCREEN, HOSP PERFORMED
AMPHETAMINES: NOT DETECTED
BARBITURATES: NOT DETECTED
Benzodiazepines: POSITIVE — AB
Cocaine: POSITIVE — AB
Opiates: NOT DETECTED
TETRAHYDROCANNABINOL: NOT DETECTED

## 2018-02-05 LAB — CBG MONITORING, ED: GLUCOSE-CAPILLARY: 208 mg/dL — AB (ref 65–99)

## 2018-02-05 LAB — I-STAT TROPONIN, ED: Troponin i, poc: 0 ng/mL (ref 0.00–0.08)

## 2018-02-05 LAB — ACETAMINOPHEN LEVEL: Acetaminophen (Tylenol), Serum: <10 ug/mL — ABNORMAL LOW (ref 10–30)

## 2018-02-05 LAB — ETHANOL: Alcohol, Ethyl (B): 106 mg/dL — ABNORMAL HIGH (ref <10)

## 2018-02-05 MED ORDER — METFORMIN HCL 500 MG PO TABS
500.0000 mg | ORAL_TABLET | Freq: Two times a day (BID) | ORAL | Status: DC
  Administered 2018-02-05: 500 mg via ORAL
  Filled 2018-02-05: qty 1

## 2018-02-05 MED ORDER — GABAPENTIN 300 MG PO CAPS
300.0000 mg | ORAL_CAPSULE | Freq: Three times a day (TID) | ORAL | Status: DC
  Administered 2018-02-05: 300 mg via ORAL
  Filled 2018-02-05: qty 1

## 2018-02-05 MED ORDER — LISINOPRIL 10 MG PO TABS
10.0000 mg | ORAL_TABLET | Freq: Every day | ORAL | Status: DC
  Administered 2018-02-05: 10 mg via ORAL
  Filled 2018-02-05: qty 1

## 2018-02-05 MED ORDER — HYDROCHLOROTHIAZIDE 12.5 MG PO CAPS
12.5000 mg | ORAL_CAPSULE | Freq: Every day | ORAL | Status: DC
  Administered 2018-02-05: 12.5 mg via ORAL
  Filled 2018-02-05: qty 1

## 2018-02-05 MED ORDER — PALIPERIDONE ER 3 MG PO TB24
3.0000 mg | ORAL_TABLET | Freq: Every day | ORAL | Status: DC
  Administered 2018-02-05: 3 mg via ORAL
  Filled 2018-02-05 (×2): qty 1

## 2018-02-05 MED ORDER — LISINOPRIL-HYDROCHLOROTHIAZIDE 10-12.5 MG PO TABS: 1.0000 | ORAL_TABLET | Freq: Every day | ORAL | Status: DC

## 2018-02-05 MED ORDER — AMLODIPINE BESYLATE 5 MG PO TABS
10.0000 mg | ORAL_TABLET | Freq: Once | ORAL | Status: AC
  Administered 2018-02-05: 10 mg via ORAL
  Filled 2018-02-05: qty 2

## 2018-02-05 MED ORDER — DULOXETINE HCL 30 MG PO CPEP
30.0000 mg | ORAL_CAPSULE | Freq: Two times a day (BID) | ORAL | Status: DC
  Administered 2018-02-05: 30 mg via ORAL
  Filled 2018-02-05: qty 1

## 2018-02-05 MED ORDER — AMLODIPINE BESYLATE 5 MG PO TABS: 10.0000 mg | ORAL_TABLET | Freq: Every day | ORAL | Status: DC

## 2018-02-05 MED ORDER — AMLODIPINE BESYLATE 5 MG PO TABS: 5.0000 mg | ORAL_TABLET | Freq: Once | ORAL | Status: DC

## 2018-02-05 NOTE — ED Notes (Signed)
Bed: WLPT1 Expected date:  Expected time:  Means of arrival:  Comments: 

## 2018-02-05 NOTE — BHH Suicide Risk Assessment (Signed)
Suicide Risk Assessment  Discharge Assessment   [facility] Discharge Suicide Risk Assessment   Principal Problem: <principal problem not specified> Discharge Diagnoses:  Patient Active Problem List   Diagnosis Date Noted  . Cocaine abuse ([place]) [F14.10]     Priority: High  . Cocaine abuse with cocaine-induced mood disorder [facility]) [F14.14] 12/05/2016    Priority: High  . Alcohol use disorder, severe, dependence ([place]) [F10.20] 08/30/2015    Priority: High  . Alcohol abuse with alcohol-induced mood disorder ([place]) [F10.14] 08/30/2015    Priority: High  . Schizoaffective disorder, depressive type ([place]) [F25.1]     Priority: High  . Acute systolic heart failure ([place]) [I50.21] 02/02/2018  . Chest pain [R07.9] 01/31/2018  . Hypokalemia [E87.6]   . Schizoaffective disorder, bipolar type ([place]) [F25.0] 11/22/2017  . Trauma [T14.90XA]   . Tachycardia [R00.0] 03/03/2017  . Alcohol abuse [F10.10]   . Hypertensive urgency [I16.0]   . Tachycardia [R00.0]   . Cerebrovascular disease [I67.9] 09/02/2015  . 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 Malignant hypertension [I10] 08/30/2015  . Alcohol withdrawal ([place]) [F10.239] 08/30/2015  . Thrombocytopenia ([place]) [D69.6] 08/27/2015  . Tobacco use disorder [F17.200] 08/26/2015  . Hyperlipidemia [E78.5] 04/28/2015  . Gout [M10.9] 08/09/2013  . Diabetes mellitus ([place]) [E11.9] 07/09/2012    Total Time spent with patient: 45 minutes   Musculoskeletal: Strength & Muscle Tone: within normal limits Gait & Station: normal Patient leans: N/A  Psychiatric Specialty Exam:   Blood pressure (!) 148/103, pulse 84, temperature 97.9 F (36.6 C), temperature source Oral, resp. rate 18, height 5\' 11"  (1.803 m), SpO2 98 %.Body mass index is 27 kg/m.  General Appearance: Casual  Eye Contact::  Good  Speech:  Normal Rate409  Volume:  Normal  Mood:  Euthymic  Affect:  Congruent  Thought Process:  Coherent and  Descriptions of Associations: Intact  Orientation:  Full (Time, Place, and Person)  Thought Content:  Hallucinations: Auditory  Suicidal Thoughts:  No  Homicidal Thoughts:  No  Memory:  Immediate;   Good Recent;   Good Remote;   Good  Judgement:  Fair  Insight:  Fair  Psychomotor Activity:  Decreased  Concentration:  Good  Recall:  Good  Fund of Knowledge:Fair  Language: Good  Akathisia:  No  Handed:  Right  AIMS (if indicated):     Assets:  Leisure Time Physical Health Resilience Social Support  Sleep:     Cognition: WNL  ADL's:  Intact   Mental Status Per Nursing Assessment::   On Admission:    Patient is well known to the psychiatric providers.  [place] reports hearing voices which he says has been going on for years.  Invega restarted along with his Cymbalta 30 mg daily for depression and gabapentin 300 mg TID for withdrawal symptoms.  He was using cocaine prior to admission.  At this time he is a medical concern with elevated BP, pulse, and blood sugar.  Psychiatrically cleared.  Demographic Factors:  Male  Loss Factors: NA  Historical Factors: NA  Risk Reduction Factors:   Sense of responsibility to family and Positive therapeutic relationship  Continued Clinical Symptoms:  Hallucinations, auditory, ongoing for years  Cognitive Features That Contribute To Risk:  None    Suicide Risk:  Minimal: No identifiable suicidal ideation.  Patients presenting with no risk factors but with morbid ruminations; may be classified as minimal risk based on the severity of the depressive symptoms    Plan Of Care/Follow-up recommendations:  Activity:  as tolerated Diet:  heart healthy diet  LORD, JAMISON, NP 02/05/2018, 7:40 AM

## 2018-02-05 NOTE — Discharge Instructions (Signed)
Make sure you continue taking your blood pressure medications as prescribed.  Follow-up with a psychiatrist as discussed.  For your mental health needs, you are advised to continue treatment with [name]:       [name]      [address]      [place], [place] [address]      [phone]

## 2018-02-05 NOTE — Progress Notes (Signed)
Patient is well known to the psychiatric providers.  [name] reports hearing voices which he says has been going on for years.  Invega restarted along with his Cymbalta 30 mg daily for depression and gabapentin 300 mg TID for withdrawal symptoms.  He was using cocaine prior to admission.  At this time he is a medical concern with elevated BP, pulse, and blood sugar.  Psychiatrically cleared.  Waylan Boga, PMHNP

## 2018-02-05 NOTE — Progress Notes (Signed)
Asked to see patient again from a cardiac standpoint. He was just seen by me earlier this week in consultation. He underwent cath which showed non-obstructive CAD. I had made recommendations for medical therapy of his cardiomyopathy. I would recommend restarting those meds. Agree with ongoing psych evaluation - he is stable from a cardiac standpoint for further evaluation and treatment.  Cardiology will sign-off.  Pixie Casino, MD, FACC, [name] Director of the [facility]  Cardiovascular [facility] Reduction [facility] Diplomate of the American Board of Clinical Lipidology Attending Cardiologist  Direct Dial: [phone]  Fax: [phone]  Website:  [email or web address]

## 2018-02-05 NOTE — ED Notes (Signed)
AVS explained in detail. No other c/c. Given two bus tickets-one for today and one for his follow up family practice appointment on 3/23. Knows time and date and shown on AVS where appointment reminder is. Given all morning medications prior to discharge. Able to eat breakfast and use the bathroom prior to discharge. A&Ox4. Steady gait when ambulating. Walked patient outside and shown where bus stops are.

## 2018-02-05 NOTE — ED Notes (Signed)
PA at bedside.

## 2018-02-05 NOTE — BH Assessment (Signed)
Tele Assessment Note   Patient Name: [name] MRN: [id number] Referring Physician: Montine Circle, PA-C Location of Patient: [facility] Location of Provider: [place]  Tayt Moyers is an 57 y.o. male with a history of alcohol abuse, cocaine abuse, schizoaffective disorder, diabetes, hypertension, and arthritis, medication noncompliance who is well-known to the psychiatry services present with complaints of SI and HI triggered by auditory hallucinations with commands. Positive UDS's for cocaine and benzo's. Patient report experiencing auditory hallucinations with command to hurt himself and others for years, "It never goes away." Patient has not been taking his medications as directed since his previous discharge. Patient has presented to the ER multiple times with similar complaints. Patient recently discharged from [facility] (3/16-3/19, 2019) then presented to [facility] the next day. Previous hospital ER visits 1/13, 1/5/, 3/27, 3/24, 2/21 and 1/17, 2019. Present also seen at [facility] (12/1 - 12/5, 2018), [facility] [phone] - 7/10, 2018) and [facility] Emergency Dept 05/17/2017. Patient states that he is homeless.  Has been hearing voices for 20 years now. Also states that he has visual hallucinations of various creatures that other people don't see. Reports that he has not gone to [place] to get his shot for schizophrenia in 8 months.  Per [name], DNP, patient is psych-cleared  Diagnosis:  F20.9   Schizophrenia  Past Medical History:  Past Medical History:  Diagnosis Date  . Alcohol abuse   . Arthritis   . Crack cocaine use   . Depression   . Diabetes mellitus   . Drug abuse ([place])   . DTs (delirium tremens) ([place])    history of   . Gout   . Noncompliance with medication regimen   . Schizophrenia ([place])   . Stroke ([place])   . Systolic heart failure secondary to hypertension ([place])   . Uncontrolled hypertension     Past Surgical  History:  Procedure Laterality Date  . LEFT HEART CATH AND CORONARY ANGIOGRAPHY N/A 02/03/2018   Procedure: LEFT HEART CATH AND CORONARY ANGIOGRAPHY;  Surgeon: Jettie Booze, MD;  Location: [facility];  Service: Cardiovascular;  Laterality: N/A;  . MANDIBLE RECONSTRUCTION    . TONSILLECTOMY      Family History:  Family History  Problem Relation Age of Onset  . Hypertension Unknown     Social History:  reports that he has been smoking cigarettes.  He has been smoking about 0.25 packs per day. He has never used smokeless tobacco. He reports that he drinks about 0.6 - 1.2 oz of alcohol per week. He reports that he has current or past drug history. Drugs: "Crack" cocaine and Cocaine.  Additional Social History:  Alcohol / Drug Use Pain Medications: see MAR Prescriptions: see MAR Over the Counter: see MAR History of alcohol / drug use?: Yes Longest period of sobriety (when/how long): Unknown Substance #1 Name of Substance 1: Alcohol  1 - Age of First Use: 19 1 - Frequency: daily 1 - Duration: ongoing 1 - Last Use / Amount: 02/04/2018 Substance #2 Name of Substance 2: cocaine 2 - Age of First Use: 22 2 - Amount (size/oz): unknown, pt refuses to disclose 2 - Frequency: Daily 2 - Duration: ongoing 2 - Last Use / Amount: 02/04/2018  CIWA: CIWA-Ar BP: (!) 143/103 Pulse Rate: 78 COWS:    Allergies: No Known Allergies  Home Medications:  (Not in a hospital admission)  OB/GYN Status:  No LMP for male patient.  General Assessment Data Location of  Assessment: [facility] TTS Assessment: In system Is this a Tele or Face-to-Face Assessment?: Face-to-Face Is this an Initial Assessment or a Re-assessment for this encounter?: Initial Assessment Marital status: Separated Living Arrangements: Alone Can pt return to current living arrangement?: Yes Admission Status: Voluntary Is patient capable of signing voluntary admission?: Yes Referral Source:  Self/Family/Friend Insurance type: self-pay     Crisis Care Plan Living Arrangements: Alone Name of Psychiatrist: Beverly Sessions Name of Therapist: Beverly Sessions  Education Status Is patient currently in school?: No Is the patient employed, unemployed or receiving disability?: Unemployed  Risk to self with the past 6 months Suicidal Ideation: Yes-Currently Present Has patient been a risk to self within the past 6 months prior to admission? : Yes Suicidal Intent: No Has patient had any suicidal intent within the past 6 months prior to admission? : Yes Is patient at risk for suicide?: No, but patient needs Medical Clearance Suicidal Plan?: No Has patient had any suicidal plan within the past 6 months prior to admission? : Yes Specify Current Suicidal Plan: n/a Access to Means: No Specify Access to Suicidal Means: none report What has been your use of drugs/alcohol within the last 12 months?: cocaine and Benzo's(UDS's positive for both substances) Previous Attempts/Gestures: Yes How many times?: (multiple ) Other Self Harm Risks: n/a Triggers for Past Attempts: Unknown Intentional Self Injurious Behavior: None Family Suicide History: Unknown Recent stressful life event(s): (pt report familly conflict) Persecutory voices/beliefs?: No Depression: Yes Depression Symptoms: Feeling worthless/self pity Substance abuse history and/or treatment for substance abuse?: Yes Suicide prevention information given to non-admitted patients: Yes  Risk to Others within the past 6 months Homicidal Ideation: Yes-Currently Present(report hearing voices makes him HI) Does patient have any lifetime risk of violence toward others beyond the six months prior to admission? : No Thoughts of Harm to Others: Yes-Currently Present Comment - Thoughts of Harm to Others: report voices telling him to harm others Current Homicidal Intent: No Current Homicidal Plan: No Describe Current Homicidal Plan: n/ Access to  Homicidal Means: No Describe Access to Homicidal Means: n/a Identified Victim: n/a(report voices is telling him to harm others, no specific per) History of harm to others?: No Assessment of Violence: None Noted Violent Behavior Description: n/a Does patient have access to weapons?: No Criminal Charges Pending?: No Does patient have a court date: No Is patient on probation?: No  Psychosis Hallucinations: Auditory, Visual Delusions: None noted  Mental Status Report Appearance/Hygiene: In scrubs Eye Contact: Fair Motor Activity: Freedom of movement Speech: Soft, Slurred Level of Consciousness: Drowsy Mood: Other (Comment)(unable to elevate mood) Affect: Unable to Assess Anxiety Level: None Thought Processes: Circumstantial Judgement: Unimpaired Orientation: Person, Place, Time Obsessive Compulsive Thoughts/Behaviors: Minimal(report hearing voices)  Cognitive Functioning Concentration: Normal Memory: Recent Intact, Remote Intact Is patient IDD: No Is patient DD?: No Insight: Poor Impulse Control: Fair Appetite: Fair Have you had any weight changes? : No Change Sleep: Decreased Total Hours of Sleep: 7 Vegetative Symptoms: None  ADLScreening [facility] Assessment Services) Patient's cognitive ability adequate to safely complete daily activities?: Yes Patient able to express need for assistance with ADLs?: Yes Independently performs ADLs?: Yes (appropriate for developmental age)  Prior Inpatient Therapy Prior Inpatient Therapy: Yes Prior Therapy Dates: 2019,2018, 2017 Prior Therapy Facilty/Provider(s): [facility], [facility] Reason for Treatment: MH issues  Prior Outpatient Therapy Prior Outpatient Therapy: Yes Prior Therapy Dates: 2018 Prior Therapy Facilty/Provider(s): [name] Reason for Treatment: Med mang Does patient have an ACCT team?: No Does patient have Intensive In-House Services?  :  No Does patient have [name] services? : Yes Does patient have P4CC services?: No  ADL  Screening (condition at time of admission) Patient's cognitive ability adequate to safely complete daily activities?: Yes Is the patient deaf or have difficulty hearing?: No Does the patient have difficulty seeing, even when wearing glasses/contacts?: No Does the patient have difficulty concentrating, remembering, or making decisions?: No Patient able to express need for assistance with ADLs?: Yes Does the patient have difficulty dressing or bathing?: No Independently performs ADLs?: Yes (appropriate for developmental age) Weakness of Legs: None Weakness of Arms/Hands: None       Abuse/Neglect Assessment (Assessment to be complete while patient is alone) Physical Abuse: Denies Verbal Abuse: Denies Sexual Abuse: Denies Exploitation of patient/patient's resources: Denies Self-Neglect: Denies     Regulatory affairs officer (For Healthcare) Does Patient Have a Medical Advance Directive?: No Would patient like information on creating a medical advance directive?: No - Patient declined    Additional Information 1:1 In Past 12 Months?: No CIRT Risk: No Elopement Risk: No Does patient have medical clearance?: No     Disposition:  Disposition Initial Assessment Completed for this Encounter: Yes Disposition of Patient: Discharge(Per [name], patient is psych cleared)  Natisha Trzcinski [facility] 02/05/2018 8:23 AM

## 2018-02-05 NOTE — ED Provider Notes (Signed)
[facility] DEPT Provider Note   CSN: [id number] Arrival date & time: 02/05/18  0308     History   Chief Complaint Chief Complaint  Patient presents with  . Suicidal  . Homicidal  . Hyperglycemia    HPI [name] is a 57 y.o. male.  Patient presents to the emergency department with a chief complaint of suicidal ideation and auditory hallucinations.  He states that he has been having these symptoms for quite some time.  He states that they worsen in severity.  He was recently admitted to the hospital for hypertensive urgency and positive troponin.  He does abuse cocaine.  He has not been taking his medications as directed since discharge.  He does not report any significant chest pain or shortness of breath.  Denies fever, chills, nausea, vomiting, diarrhea.  The history is provided by the patient. No language interpreter was used.    Past Medical History:  Diagnosis Date  . Alcohol abuse   . Arthritis   . Crack cocaine use   . Depression   . Diabetes mellitus   . Drug abuse ([place])   . DTs (delirium tremens) ([place])    history of   . Gout   . Noncompliance with medication regimen   . Schizophrenia ([place])   . Stroke ([place])   . Systolic heart failure secondary to hypertension ([place])   . Uncontrolled hypertension     Patient Active Problem List   Diagnosis Date Noted  . Acute systolic heart failure ([place]) 02/02/2018  . Chest pain 01/31/2018  . Hypokalemia   . Schizoaffective disorder, bipolar type ([place]) 11/22/2017  . Trauma   . Tachycardia 03/03/2017  . Alcohol abuse   . Cocaine abuse ([place])   . Hypertensive urgency   . Tachycardia   . Cocaine abuse with cocaine-induced mood disorder ([place]) 12/05/2016  . Cerebrovascular disease 09/02/2015  . 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 Malignant hypertension 08/30/2015  . Alcohol use disorder, severe, dependence ([place]) 08/30/2015  .  Alcohol withdrawal ([place]) 08/30/2015  . Alcohol abuse with alcohol-induced mood disorder ([place]) 08/30/2015  . Thrombocytopenia ([place]) 08/27/2015  . Tobacco use disorder 08/26/2015  . Hyperlipidemia 04/28/2015  . Schizoaffective disorder, depressive type ([place])   . Gout 08/09/2013  . Diabetes mellitus ([place]) 07/09/2012    Past Surgical History:  Procedure Laterality Date  . LEFT HEART CATH AND CORONARY ANGIOGRAPHY N/A 02/03/2018   Procedure: LEFT HEART CATH AND CORONARY ANGIOGRAPHY;  Surgeon: Jettie Booze, MD;  Location: [facility];  Service: Cardiovascular;  Laterality: N/A;  . MANDIBLE RECONSTRUCTION    . TONSILLECTOMY         Home Medications    Prior to Admission medications   Medication Sig Start Date End Date Taking? Authorizing Provider  amLODipine (NORVASC) 10 MG tablet Take 1 tablet (10 mg total) by mouth daily. 02/04/18   Colbert Ewing, MD  DULoxetine (CYMBALTA) 30 MG capsule Take 1 capsule (30 mg total) by mouth 2 (two) times daily. 02/04/18   Colbert Ewing, MD  gabapentin (NEURONTIN) 300 MG capsule Take 1 capsule (300 mg total) by mouth 3 (three) times daily. 02/04/18   Colbert Ewing, MD  lisinopril-hydrochlorothiazide (PRINZIDE,ZESTORETIC) 10-12.5 MG tablet Take 1 tablet by mouth daily. 02/04/18   Colbert Ewing, MD  metFORMIN (GLUCOPHAGE) 500 MG tablet Take 1 tablet (500 mg total) by mouth 2 (two) times daily with a meal. 02/04/18   Colbert Ewing, MD  paliperidone (INVEGA) 3 MG  24 hr tablet Take 1 tablet (3 mg total) by mouth daily. 02/04/18   Colbert Ewing, MD    Family History Family History  Problem Relation Age of Onset  . Hypertension Unknown     Social History Social History   Tobacco Use  . Smoking status: Current Every Day Smoker    Packs/day: 0.25    Types: Cigarettes  . Smokeless tobacco: Never Used  Substance Use Topics  . Alcohol use: Yes    Alcohol/week: 0.6 - 1.2 oz    Types: 1 - 2 Cans of beer per week    Comment: 2 cases  of beer a day + liquour  . Drug use: Yes    Types: "Crack" cocaine, Cocaine     Allergies   Patient has no known allergies.   Review of Systems Review of Systems  All other systems reviewed and are negative.    Physical Exam Updated Vital Signs BP (!) 175/125 (BP Location: Right Arm)   Pulse (!) 101   Temp 97.9 F (36.6 C) (Oral)   Resp 18   Ht 5\' 11"  (1.803 m)   SpO2 95%   BMI 27.00 kg/m   Physical Exam  Constitutional: He is oriented to person, place, and time. He appears well-developed and well-nourished.  HENT:  Head: Normocephalic and atraumatic.  Eyes: Pupils are equal, round, and reactive to light. Conjunctivae and EOM are normal. Right eye exhibits no discharge. Left eye exhibits no discharge. No scleral icterus.  Neck: Normal range of motion. Neck supple. No JVD present.  Cardiovascular: Normal rate, regular rhythm and normal heart sounds. Exam reveals no gallop and no friction rub.  No murmur heard. Pulmonary/Chest: Effort normal and breath sounds normal. No respiratory distress. He has no wheezes. He has no rales. He exhibits no tenderness.  Abdominal: Soft. He exhibits no distension and no mass. There is no tenderness. There is no rebound and no guarding.  Musculoskeletal: Normal range of motion. He exhibits no edema or tenderness.  Neurological: He is alert and oriented to person, place, and time.  Skin: Skin is warm and dry.  Psychiatric: He has a normal mood and affect. His behavior is normal. Judgment and thought content normal.  Nursing note and vitals reviewed.    ED Treatments / Results  Labs (all labs ordered are listed, but only abnormal results are displayed) Labs Reviewed  COMPREHENSIVE METABOLIC PANEL - Abnormal; Notable for the following components:      Result Value   Glucose, Bld 256 (*)    Total Protein 8.3 (*)    ALT 15 (*)    Total Bilirubin 0.2 (*)    All other components within normal limits  ETHANOL - Abnormal; Notable for the  following components:   Alcohol, Ethyl (B) 106 (*)    All other components within normal limits  ACETAMINOPHEN LEVEL - Abnormal; Notable for the following components:   Acetaminophen (Tylenol), Serum <10 (*)    All other components within normal limits  RAPID URINE DRUG SCREEN, HOSP PERFORMED - Abnormal; Notable for the following components:   Cocaine POSITIVE (*)    Benzodiazepines POSITIVE (*)    All other components within normal limits  CBG MONITORING, ED - Abnormal; Notable for the following components:   Glucose-Capillary 208 (*)    All other components within normal limits  SALICYLATE LEVEL  CBC  I-STAT TROPONIN, ED    EKG  EKG Interpretation  Date/Time:  Thursday February 05 2018 05:23:58 EDT Ventricular Rate:  90 PR Interval:    QRS Duration: 109 QT Interval:  383 QTC Calculation: 469 R Axis:   -26 Text Interpretation:  Sinus rhythm Prolonged PR interval Biatrial enlargement Left ventricular hypertrophy When compared with ECG of 02/01/2018, No significant change was found Confirmed by Delora Fuel ([id number]) on 02/05/2018 5:28:57 AM       Radiology No results found.  Procedures Procedures (including critical care time)  Medications Ordered in ED Medications  DULoxetine (CYMBALTA) DR capsule 30 mg (has no administration in time range)  gabapentin (NEURONTIN) capsule 300 mg (has no administration in time range)  metFORMIN (GLUCOPHAGE) tablet 500 mg (has no administration in time range)  lisinopril (PRINIVIL,ZESTRIL) tablet 10 mg (has no administration in time range)    And  hydrochlorothiazide (MICROZIDE) capsule 12.5 mg (has no administration in time range)  amLODipine (NORVASC) tablet 10 mg (has no administration in time range)     Initial Impression / Assessment and Plan / ED Course  I have reviewed the triage vital signs and the nursing notes.  Pertinent labs & imaging results that were available during my care of the patient were reviewed by me and considered  in my medical decision making (see chart for details).     Patient was suicidal ideation and auditory hallucinations.  Prior records reviewed, noted to have been recently admitted for hypertensive urgency and elevated troponin.  States that he might of had some chest pain earlier, but is mainly concerned about his suicidal thoughts.  I did check a troponin, which was negative.  There is no new ischemic changes on EKG.    Ordered home meds, which should bring down blood pressure.  TTS evaluation pending for suicidal ideation and auditory hallucinations.  Final Clinical Impressions(s) / ED Diagnoses   Final diagnoses:  Suicidal ideation  Hallucinations    ED Discharge Orders    None       Montine Circle, PA-C 13/24/40 1027    Delora Fuel, MD 25/36/64 [phone]

## 2018-02-05 NOTE — ED Notes (Signed)
Per [name], patient has been cleared by psych-states EDP can discharge patient

## 2018-02-05 NOTE — BH Assessment (Signed)
[facility] Assessment Progress Note  Per Buford Dresser, DO, this pt does not require psychiatric hospitalization at this time.  Pt is psychiatrically cleared.  Pt is to be advised to follow up with [facility] for his behavioral health needs.  This has been included in pt's discharge instructions.  [name] nurse has been notified.  Jalene Mullet, [place] Triage Specialist [phone]

## 2018-02-05 NOTE — ED Provider Notes (Signed)
Patient psychiatrically cleared this morning.  He was also cleared by cardiology standpoint.  Per review of records, the patient underwent cath just last week and had no obstructive disease.  He was hypertensive on arrival but was given his home medications with steady improvement.  No ongoing chest pain.  Lab work reviewed and is unremarkable.  He is hyperglycemic but no signs of DKA.  Medically stable for discharge.   Duffy Bruce, MD 02/05/18 [phone]

## 2018-02-05 NOTE — ED Triage Notes (Signed)
Pt presents via EMS with c/o SI and HI. Pt was recently discharged from the unit at [facility]. Upon EMS arrival, pt was hypertensive at 190/110 and hyperglycemic at 305. Pt is calm and cooperative at this time.

## 2018-02-14 ENCOUNTER — Emergency Department (HOSPITAL_COMMUNITY)
Admission: EM | Admit: 2018-02-14 | Discharge: 2018-02-15 | Disposition: A | Discharge status: Home / Self Care | Payer: Medicaid Other | Attending: Emergency Medicine | Admitting: Emergency Medicine

## 2018-02-14 ENCOUNTER — Other Ambulatory Visit: Payer: Self-pay

## 2018-02-14 ENCOUNTER — Ambulatory Visit (HOSPITAL_COMMUNITY)
Admission: RE | Admit: 2018-02-14 | Discharge: 2018-02-14 | Disposition: A | Discharge status: Home / Self Care | Payer: Medicaid Other | Attending: Psychiatry | Admitting: Psychiatry

## 2018-02-14 ENCOUNTER — Encounter (HOSPITAL_COMMUNITY): Payer: Self-pay | Admitting: Emergency Medicine

## 2018-02-14 DIAGNOSIS — M2550 Pain in unspecified joint: Secondary | ICD-10-CM | POA: Insufficient documentation

## 2018-02-14 DIAGNOSIS — R45851 Suicidal ideations: Secondary | ICD-10-CM | POA: Insufficient documentation

## 2018-02-14 DIAGNOSIS — F1910 Other psychoactive substance abuse, uncomplicated: Secondary | ICD-10-CM | POA: Insufficient documentation

## 2018-02-14 DIAGNOSIS — R51 Headache: Secondary | ICD-10-CM | POA: Insufficient documentation

## 2018-02-14 DIAGNOSIS — E119 Type 2 diabetes mellitus without complications: Secondary | ICD-10-CM | POA: Insufficient documentation

## 2018-02-14 DIAGNOSIS — F1020 Alcohol dependence, uncomplicated: Secondary | ICD-10-CM

## 2018-02-14 DIAGNOSIS — R45850 Homicidal ideations: Secondary | ICD-10-CM | POA: Insufficient documentation

## 2018-02-14 DIAGNOSIS — F17210 Nicotine dependence, cigarettes, uncomplicated: Secondary | ICD-10-CM | POA: Diagnosis not present

## 2018-02-14 DIAGNOSIS — F329 Major depressive disorder, single episode, unspecified: Secondary | ICD-10-CM | POA: Diagnosis not present

## 2018-02-14 DIAGNOSIS — I5021 Acute systolic (congestive) heart failure: Secondary | ICD-10-CM | POA: Diagnosis not present

## 2018-02-14 DIAGNOSIS — I110 Hypertensive heart disease with heart failure: Secondary | ICD-10-CM | POA: Insufficient documentation

## 2018-02-14 DIAGNOSIS — Z7984 Long term (current) use of oral hypoglycemic drugs: Secondary | ICD-10-CM | POA: Diagnosis not present

## 2018-02-14 DIAGNOSIS — F1014 Alcohol abuse with alcohol-induced mood disorder: Secondary | ICD-10-CM | POA: Diagnosis not present

## 2018-02-14 DIAGNOSIS — R443 Hallucinations, unspecified: Secondary | ICD-10-CM | POA: Insufficient documentation

## 2018-02-14 DIAGNOSIS — D696 Thrombocytopenia, unspecified: Secondary | ICD-10-CM

## 2018-02-14 DIAGNOSIS — Z79899 Other long term (current) drug therapy: Secondary | ICD-10-CM | POA: Insufficient documentation

## 2018-02-14 DIAGNOSIS — R440 Auditory hallucinations: Secondary | ICD-10-CM | POA: Diagnosis present

## 2018-02-14 DIAGNOSIS — Z139 Encounter for screening, unspecified: Secondary | ICD-10-CM

## 2018-02-14 DIAGNOSIS — F1410 Cocaine abuse, uncomplicated: Secondary | ICD-10-CM | POA: Diagnosis not present

## 2018-02-14 NOTE — H&P (Signed)
Behavioral Health Medical Screening Exam  [name] is a 57 y.o. male who presents to [facility] with his sister. Patient reports suicidal ideations with a plan to shoot himself or walk into traffic. Reports that he has access to a 38 pistol. Patient also reports that he is having homicidal ideations with a plan to pour gasoline on someone and set them on fire. Denies thoughts of killing any specific person. States "If I do not get help, I am going to kill someone tonight."   Total Time spent with patient: 20 minutes  Psychiatric Specialty Exam: Physical Exam  Constitutional: He is oriented to person, place, and time. He appears well-developed and well-nourished. No distress.  HENT:  Head: Normocephalic and atraumatic.  Right Ear: External ear normal.  Eyes: Conjunctivae are normal. Right eye exhibits no discharge. Left eye exhibits no discharge. No scleral icterus.  Cardiovascular: Tachycardia present.  Respiratory: Effort normal. No respiratory distress.  Musculoskeletal: Normal range of motion.  Neurological: He is alert and oriented to person, place, and time.  Skin: Skin is warm and dry. He is not diaphoretic.  Psychiatric: His mood appears anxious. His affect is blunt. He expresses impulsivity and inappropriate judgment. He exhibits a depressed mood. He expresses homicidal and suicidal ideation. He expresses suicidal plans and homicidal plans.    Review of Systems  Constitutional: Negative for chills, fever and weight loss.  Cardiovascular: Negative for chest pain.  Musculoskeletal: Positive for joint pain.  Neurological: Positive for headaches.  Psychiatric/Behavioral: Positive for depression, hallucinations, substance abuse and suicidal ideas. Negative for memory loss. The patient is nervous/anxious and has insomnia.     Blood pressure (!) 183/113, pulse (!) 115, temperature 98 F (36.7 C), resp. rate 20, SpO2 99 %.There is no height or weight on file to calculate BMI.   General Appearance: Disheveled  Eye Contact:  Fair  Speech:  Clear and Coherent and Normal Rate  Volume:  Normal  Mood:  Anxious, Depressed, Dysphoric, Hopeless and Worthless  Affect:  Blunt and Congruent  Thought Process:  Coherent, Disorganized and Descriptions of Associations: Intact  Orientation:  Full (Time, Place, and Person)  Thought Content:  Hallucinations: Auditory Command:  Voices are telling him to kill someone Visual  Suicidal Thoughts:  Yes.  with intent/plan  Homicidal Thoughts:  States "I am going to kill someone tonight."  Memory:  Immediate;   Good Recent;   Good  Judgement:  Impaired  Insight:  Lacking  Psychomotor Activity:  Restlessness  Concentration: Concentration: Fair and Attention Span: Fair  Recall:  Good  Fund of Knowledge:Good  Language: Good  Akathisia:  No  Handed:  Right  AIMS (if indicated):     Assets:  Communication Skills Desire for Improvement Leisure Time  Sleep:       Musculoskeletal: Strength & Muscle Tone: within normal limits Gait & Station: normal   Blood pressure (!) 183/113, pulse (!) 115, temperature 98 F (36.7 C), resp. rate 20, SpO2 99 %.  Recommendations:  Based on my evaluation the patient does not appear to have an emergency medical condition.  Rozetta Nunnery, NP 02/14/2018, 9:14 PM
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
## 2018-02-14 NOTE — ED Triage Notes (Signed)
Pt reports that he has been hearing voices and suicidal ideation. Pt reports wanting to shoot himself with a gun he has but at this time reports not having any bullets.

## 2018-02-14 NOTE — ED Notes (Signed)
Bed: [facility] Expected date:  Expected time:  Means of arrival:  Comments: 

## 2018-02-15 DIAGNOSIS — F1014 Alcohol abuse with alcohol-induced mood disorder: Secondary | ICD-10-CM

## 2018-02-15 DIAGNOSIS — F17210 Nicotine dependence, cigarettes, uncomplicated: Secondary | ICD-10-CM | POA: Diagnosis not present

## 2018-02-15 DIAGNOSIS — F1410 Cocaine abuse, uncomplicated: Secondary | ICD-10-CM

## 2018-02-15 LAB — RAPID URINE DRUG SCREEN, HOSP PERFORMED
Amphetamines: NOT DETECTED
BARBITURATES: NOT DETECTED
Benzodiazepines: POSITIVE — AB
Cocaine: POSITIVE — AB
Opiates: NOT DETECTED
Tetrahydrocannabinol: NOT DETECTED

## 2018-02-15 LAB — CBC WITH DIFFERENTIAL/PLATELET
Basophils Absolute: 0 10*3/uL (ref 0.0–0.1)
Basophils Relative: 1 %
EOS ABS: 0.2 10*3/uL (ref 0.0–0.7)
Eosinophils Relative: 3 %
HEMATOCRIT: 39 % (ref 39.0–52.0)
Hemoglobin: 12.8 g/dL — ABNORMAL LOW (ref 13.0–17.0)
LYMPHS ABS: 3.4 10*3/uL (ref 0.7–4.0)
Lymphocytes Relative: 48 %
MCH: 30 pg (ref 26.0–34.0)
MCHC: 32.8 g/dL (ref 30.0–36.0)
MCV: 91.3 fL (ref 78.0–100.0)
Monocytes Absolute: 0.4 10*3/uL (ref 0.1–1.0)
Monocytes Relative: 6 %
NEUTROS PCT: 42 %
Neutro Abs: 2.9 10*3/uL (ref 1.7–7.7)
Platelets: 199 10*3/uL (ref 150–400)
RBC: 4.27 MIL/uL (ref 4.22–5.81)
RDW: 12.7 % (ref 11.5–15.5)
WBC: 7 10*3/uL (ref 4.0–10.5)

## 2018-02-15 LAB — COMPREHENSIVE METABOLIC PANEL
ALBUMIN: 3.6 g/dL (ref 3.5–5.0)
ALK PHOS: 66 U/L (ref 38–126)
ALT: 38 U/L (ref 17–63)
AST: 69 U/L — AB (ref 15–41)
Anion gap: 11 (ref 5–15)
BILIRUBIN TOTAL: 0.4 mg/dL (ref 0.3–1.2)
BUN: 21 mg/dL — AB (ref 6–20)
CALCIUM: 9 mg/dL (ref 8.9–10.3)
CO2: 25 mmol/L (ref 22–32)
CREATININE: 1.23 mg/dL (ref 0.61–1.24)
Chloride: 103 mmol/L (ref 101–111)
GFR calc Af Amer: >60 mL/min (ref >60)
GFR calc non Af Amer: >60 mL/min (ref >60)
GLUCOSE: 203 mg/dL — AB (ref 65–99)
POTASSIUM: 3.9 mmol/L (ref 3.5–5.1)
Sodium: 139 mmol/L (ref 135–145)
TOTAL PROTEIN: 7.6 g/dL (ref 6.5–8.1)

## 2018-02-15 LAB — CBG MONITORING, ED: Glucose-Capillary: 197 mg/dL — ABNORMAL HIGH (ref 65–99)

## 2018-02-15 LAB — ACETAMINOPHEN LEVEL

## 2018-02-15 LAB — ETHANOL: Alcohol, Ethyl (B): 105 mg/dL — ABNORMAL HIGH (ref <10)

## 2018-02-15 LAB — SALICYLATE LEVEL

## 2018-02-15 MED ORDER — DULOXETINE HCL 30 MG PO CPEP: 30.0000 mg | ORAL_CAPSULE | Freq: Two times a day (BID) | ORAL | 0 refills | Status: DC

## 2018-02-15 MED ORDER — LISINOPRIL 10 MG PO TABS: 10.0000 mg | ORAL_TABLET | Freq: Every day | ORAL | 0 refills | Status: DC

## 2018-02-15 MED ORDER — HYDROCHLOROTHIAZIDE 12.5 MG PO CAPS: 12.5000 mg | ORAL_CAPSULE | Freq: Every day | ORAL | 0 refills | Status: DC

## 2018-02-15 MED ORDER — CLONIDINE HCL 0.1 MG PO TABS
0.2000 mg | ORAL_TABLET | Freq: Every day | ORAL | Status: DC
  Administered 2018-02-15: 0.2 mg via ORAL
  Filled 2018-02-15: qty 2

## 2018-02-15 MED ORDER — GABAPENTIN 300 MG PO CAPS: 300.0000 mg | ORAL_CAPSULE | Freq: Three times a day (TID) | ORAL | 0 refills | Status: DC

## 2018-02-15 MED ORDER — PALIPERIDONE ER 3 MG PO TB24: 3.0000 mg | ORAL_TABLET | Freq: Every day | ORAL | 0 refills | Status: DC

## 2018-02-15 MED ORDER — LISINOPRIL-HYDROCHLOROTHIAZIDE 10-12.5 MG PO TABS: 1.0000 | ORAL_TABLET | Freq: Every day | ORAL | Status: DC

## 2018-02-15 MED ORDER — METFORMIN HCL 500 MG PO TABS
500.0000 mg | ORAL_TABLET | Freq: Two times a day (BID) | ORAL | Status: DC
  Administered 2018-02-15: 500 mg via ORAL
  Filled 2018-02-15: qty 1

## 2018-02-15 MED ORDER — GABAPENTIN 300 MG PO CAPS
300.0000 mg | ORAL_CAPSULE | Freq: Three times a day (TID) | ORAL | Status: DC
  Administered 2018-02-15: 300 mg via ORAL
  Filled 2018-02-15: qty 1

## 2018-02-15 MED ORDER — PALIPERIDONE ER 3 MG PO TB24
3.0000 mg | ORAL_TABLET | Freq: Every day | ORAL | Status: DC
  Administered 2018-02-15: 3 mg via ORAL
  Filled 2018-02-15: qty 1

## 2018-02-15 MED ORDER — AMLODIPINE BESYLATE 10 MG PO TABS: 10.0000 mg | ORAL_TABLET | Freq: Every day | ORAL | 0 refills | Status: DC

## 2018-02-15 MED ORDER — HYDROCHLOROTHIAZIDE 12.5 MG PO CAPS
12.5000 mg | ORAL_CAPSULE | Freq: Every day | ORAL | Status: DC
  Administered 2018-02-15: 12.5 mg via ORAL
  Filled 2018-02-15: qty 1

## 2018-02-15 MED ORDER — AMLODIPINE BESYLATE 5 MG PO TABS
10.0000 mg | ORAL_TABLET | Freq: Every day | ORAL | Status: DC
  Administered 2018-02-15: 10 mg via ORAL
  Filled 2018-02-15: qty 2

## 2018-02-15 MED ORDER — METFORMIN HCL 500 MG PO TABS: 500.0000 mg | ORAL_TABLET | Freq: Two times a day (BID) | ORAL | 0 refills | Status: DC

## 2018-02-15 MED ORDER — LISINOPRIL 10 MG PO TABS
10.0000 mg | ORAL_TABLET | Freq: Every day | ORAL | Status: DC
Start: 2018-02-15 — End: 2018-02-15
  Administered 2018-02-15: 10 mg via ORAL
  Filled 2018-02-15: qty 1

## 2018-02-15 MED ORDER — DULOXETINE HCL 30 MG PO CPEP
30.0000 mg | ORAL_CAPSULE | Freq: Two times a day (BID) | ORAL | Status: DC
  Administered 2018-02-15: 30 mg via ORAL
  Filled 2018-02-15: qty 1

## 2018-02-15 NOTE — ED Notes (Signed)
Dr Tiburcio Pea and [name] DNP into see.  Pt denies si/hi/vh at this time, but reports continued AH.  Pt reports that sometimes the voices tell him things, but is mainly chatter.  Pt also reports etoh/drug.  Pt also reports that he has chronic hip pain.  NAD, procedures explained.

## 2018-02-15 NOTE — ED Notes (Addendum)
[name] sister [name] is aware that pt is being dc'd and his follow up for tomorrow (release on chart)

## 2018-02-15 NOTE — ED Notes (Signed)
Bed: [facility] Expected date:  Expected time:  Means of arrival:  Comments: [place]

## 2018-02-15 NOTE — ED Notes (Signed)
Up to the bathroom 

## 2018-02-15 NOTE — ED Notes (Signed)
Up tot he bathroom to shower and change scrubs 

## 2018-02-15 NOTE — ED Notes (Addendum)
Written dc instructions and follow up reviewed with pt.  Pt alert/oriented x4, denies si/hi on dc.  Shelter information given, bus passes given.  Pt encouraged to get his medications tomorrow and take them as directed, and contact Aaron Edelman tomorrow as he discussed with him Aaron Edelman) for treatment. Pt verbalized understanding and reported that he would.  Pt also encouraged to seek treatment for return of suicidal thougths or urges, pt verbalized understanding.

## 2018-02-15 NOTE — ED Notes (Signed)
Pt arrived to unit and is cooperative with care and pleasant with staff. Pt able to talk briefly on the phone with his sister who called the unit. Pt with passive SI, but does verbally contract for safety. Pt states he is seeking inpatient admission at this time for depression. No distress noted. Pt ambulatory; no distress noted. Sitter at bedside for safety.

## 2018-02-15 NOTE — ED Notes (Signed)
Pt is aware that he being dc'd and that he needs to contact peer support tomorrow for follow up.  [name] prescriptions have been faxed to his pharmacy for pt to pick up.

## 2018-02-15 NOTE — Consult Note (Addendum)
[place] [facility]iatry Consult   Reason for Consult:  Alcohol and cocaine abuse with suicidal ideations Referring Physician:  EDP Patient Identification: [name] MRN:  [id number] Principal Diagnosis: Alcohol abuse with alcohol-induced mood disorder [facility]) Diagnosis:   Patient Active Problem List   Diagnosis Date Noted  . Cocaine abuse ([place]) [F14.10]     Priority: High  . Cocaine abuse with cocaine-induced mood disorder [facility]) [F14.14] 12/05/2016    Priority: High  . Alcohol use disorder, severe, dependence ([place]) [F10.20] 08/30/2015    Priority: High  . Alcohol abuse with alcohol-induced mood disorder ([place]) [F10.14] 08/30/2015    Priority: High  . Schizoaffective disorder, depressive type ([place]) [F25.1]     Priority: High  . Acute systolic heart failure ([place]) [I50.21] 02/02/2018  . Chest pain [R07.9] 01/31/2018  . Hypokalemia [E87.6]   . Schizoaffective disorder, bipolar type ([place]) [F25.0] 11/22/2017  . Trauma [T14.90XA]   . Tachycardia [R00.0] 03/03/2017  . Alcohol abuse [F10.10]   . Hypertensive urgency [I16.0]   . Tachycardia [R00.0]   . Cerebrovascular disease [I67.9] 09/02/2015  . 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 Malignant hypertension [I10] 08/30/2015  . Alcohol withdrawal ([place]) [F10.239] 08/30/2015  . Thrombocytopenia ([place]) [D69.6] 08/27/2015  . Tobacco use disorder [F17.200] 08/26/2015  . Hyperlipidemia [E78.5] 04/28/2015  . Gout [M10.9] 08/09/2013  . Diabetes mellitus ([place]) [E11.9] 07/09/2012    Total Time spent with patient: 45 minutes  Subjective:   [name] is a 57 y.o. male patient does not warrant admission.  HPI:  57 yo male who came to the ED for suicidal ideations after using cocaine and alcohol.  Today, he is clear and coherent with no suicidal/homicidal ideations, hallucinations, and withdrawal symptoms.  He is interested in getting help for his substance abuse, Peer  Support consult placed.  Educated him about his medications not being effective with the use of cocaine and alcohol.  Past Psychiatric History: substance abuse, schizoaffective disorder  Risk to Self:None Risk to Others:  None Prior Inpatient Therapy:  Multiple times Prior Outpatient Therapy:  Current  Past Medical History:  Past Medical History:  Diagnosis Date  . Alcohol abuse   . Arthritis   . Crack cocaine use   . Depression   . Diabetes mellitus   . Drug abuse ([place])   . DTs (delirium tremens) ([place])    history of   . Gout   . Noncompliance with medication regimen   . Schizophrenia ([place])   . Stroke ([place])   . Systolic heart failure secondary to hypertension ([place])   . Uncontrolled hypertension     Past Surgical History:  Procedure Laterality Date  . LEFT HEART CATH AND CORONARY ANGIOGRAPHY N/A 02/03/2018   Procedure: LEFT HEART CATH AND CORONARY ANGIOGRAPHY;  Surgeon: Jettie Booze, MD;  Location: [facility];  Service: Cardiovascular;  Laterality: N/A;  . MANDIBLE RECONSTRUCTION    . TONSILLECTOMY     Family History:  Family History  Problem Relation Age of Onset  . Hypertension Unknown    Family Psychiatric  History: none  Social History:  Social History   Substance and Sexual Activity  Alcohol Use Yes  . Alcohol/week: 0.6 - 1.2 oz  . Types: 1 - 2 Cans of beer per week   Comment: 2 cases of beer a day + liquour     Social History   Substance and Sexual Activity  Drug Use Yes  . Types: "Crack" cocaine, Cocaine  Social History   Socioeconomic History  . Marital status: Legally Separated    Spouse name: Not on file  . Number of children: Not on file  . Years of education: Not on file  . Highest education level: Not on file  Occupational History  . Not on file  Social Needs  . Financial resource strain: Not on file  . Food insecurity:    Worry: Not on file    Inability: Not on file  . Transportation needs:    Medical: Not on file     Non-medical: Not on file  Tobacco Use  . Smoking status: Current Every Day Smoker    Packs/day: 0.25    Types: Cigarettes  . Smokeless tobacco: Never Used  Substance and Sexual Activity  . Alcohol use: Yes    Alcohol/week: 0.6 - 1.2 oz    Types: 1 - 2 Cans of beer per week    Comment: 2 cases of beer a day + liquour  . Drug use: Yes    Types: "Crack" cocaine, Cocaine  . Sexual activity: Not on file  Lifestyle  . Physical activity:    Days per week: Not on file    Minutes per session: Not on file  . Stress: Not on file  Relationships  . Social connections:    Talks on phone: Not on file    Gets together: Not on file    Attends religious service: Not on file    Active member of club or organization: Not on file    Attends meetings of clubs or organizations: Not on file    Relationship status: Not on file  Other Topics Concern  . Not on file  Social History Narrative  . Not on file   Additional Social History:    Allergies:  No Known Allergies  Labs:  Results for orders placed or performed during the hospital encounter of 02/14/18 (from the past 48 hour(s))  Rapid urine drug screen (hospital performed)     Status: Abnormal   Collection Time: 02/14/18 10:49 PM  Result Value Ref Range   Opiates NONE DETECTED NONE DETECTED   Cocaine POSITIVE (A) NONE DETECTED   Benzodiazepines POSITIVE (A) NONE DETECTED   Amphetamines NONE DETECTED NONE DETECTED   Tetrahydrocannabinol NONE DETECTED NONE DETECTED   Barbiturates NONE DETECTED NONE DETECTED    Comment: (NOTE) DRUG SCREEN FOR MEDICAL PURPOSES ONLY.  IF CONFIRMATION IS NEEDED FOR ANY PURPOSE, NOTIFY LAB WITHIN 5 DAYS. LOWEST DETECTABLE LIMITS FOR URINE DRUG SCREEN Drug Class                     Cutoff (ng/mL) Amphetamine and metabolites    1000 Barbiturate and metabolites    200 Benzodiazepine                 740 Tricyclics and metabolites     300 Opiates and metabolites        300 Cocaine and metabolites         300 THC                            50 Performed at [facility], [place] [address], [place], [place] [address]   CBC with Differential     Status: Abnormal   Collection Time: 02/14/18 11:48 PM  Result Value Ref Range   WBC 7.0 4.0 - 10.5 K/uL   RBC 4.27 4.22 - 5.81 MIL/uL  Hemoglobin 12.8 (L) 13.0 - 17.0 g/dL   HCT 39.0 39.0 - 52.0 %   MCV 91.3 78.0 - 100.0 fL   MCH 30.0 26.0 - 34.0 pg   MCHC 32.8 30.0 - 36.0 g/dL   RDW 12.7 11.5 - 15.5 %   Platelets 199 150 - 400 K/uL   Neutrophils Relative % 42 %   Neutro Abs 2.9 1.7 - 7.7 K/uL   Lymphocytes Relative 48 %   Lymphs Abs 3.4 0.7 - 4.0 K/uL   Monocytes Relative 6 %   Monocytes Absolute 0.4 0.1 - 1.0 K/uL   Eosinophils Relative 3 %   Eosinophils Absolute 0.2 0.0 - 0.7 K/uL   Basophils Relative 1 %   Basophils Absolute 0.0 0.0 - 0.1 K/uL    Comment: Performed at [facility], [place] [address], [place], [place] [address]  Comprehensive metabolic panel     Status: Abnormal   Collection Time: 02/14/18 11:48 PM  Result Value Ref Range   Sodium 139 135 - 145 mmol/L   Potassium 3.9 3.5 - 5.1 mmol/L   Chloride 103 101 - 111 mmol/L   CO2 25 22 - 32 mmol/L   Glucose, Bld 203 (H) 65 - 99 mg/dL   BUN 21 (H) 6 - 20 mg/dL   Creatinine, Ser 1.23 0.61 - 1.24 mg/dL   Calcium 9.0 8.9 - 10.3 mg/dL   Total Protein 7.6 6.5 - 8.1 g/dL   Albumin 3.6 3.5 - 5.0 g/dL   AST 69 (H) 15 - 41 U/L   ALT 38 17 - 63 U/L   Alkaline Phosphatase 66 38 - 126 U/L   Total Bilirubin 0.4 0.3 - 1.2 mg/dL   GFR calc non Af Amer >60 >60 mL/min   GFR calc Af Amer >60 >60 mL/min    Comment: (NOTE) The eGFR has been calculated using the CKD EPI equation. This calculation has not been validated in all clinical situations. eGFR's persistently <60 mL/min signify possible Chronic Kidney Disease.    Anion gap 11 5 - 15    Comment: Performed at [facility], [place] [address] [place], [place] [address]  Ethanol      Status: Abnormal   Collection Time: 02/14/18 11:48 PM  Result Value Ref Range   Alcohol, Ethyl (B) 105 (H) <10 mg/dL    Comment:        LOWEST DETECTABLE LIMIT FOR SERUM ALCOHOL IS 10 mg/dL FOR MEDICAL PURPOSES ONLY Performed at [place] [address] [place], [place] [address]   Salicylate level     Status: None   Collection Time: 02/14/18 11:48 PM  Result Value Ref Range   Salicylate Lvl <0.9 2.8 - 30.0 mg/dL    Comment: Performed at [facility], [place] [address] [place], [place] [address]  Acetaminophen level     Status: Abnormal   Collection Time: 02/14/18 11:48 PM  Result Value Ref Range   Acetaminophen (Tylenol), Serum <10 (L) 10 - 30 ug/mL    Comment:        THERAPEUTIC CONCENTRATIONS VARY SIGNIFICANTLY. A RANGE OF 10-30 ug/mL MAY BE AN EFFECTIVE CONCENTRATION FOR MANY PATIENTS. HOWEVER, SOME ARE BEST TREATED AT CONCENTRATIONS OUTSIDE THIS RANGE. ACETAMINOPHEN CONCENTRATIONS >150 ug/mL AT 4 HOURS AFTER INGESTION AND >50 ug/mL AT 12 HOURS AFTER INGESTION ARE OFTEN ASSOCIATED WITH TOXIC REACTIONS. Performed at [facility], [place] [address] [place], [place] [address]   CBG monitoring, ED     Status:  Abnormal   Collection Time: 02/15/18  8:01 AM  Result Value Ref Range   Glucose-Capillary 197 (H) 65 - 99 mg/dL   Comment 1 Notify RN     Current Facility-Administered Medications  Medication Dose Route Frequency Provider Last Rate Last Dose  . amLODipine (NORVASC) tablet 10 mg  10 mg Oral Daily Larene Pickett, PA-C   10 mg at 02/15/18 7253  . cloNIDine (CATAPRES) tablet 0.2 mg  0.2 mg Oral Daily Molpus, John, MD   0.2 mg at 02/15/18 6644  . DULoxetine (CYMBALTA) DR capsule 30 mg  30 mg Oral BID Larene Pickett, PA-C   30 mg at 02/15/18 0347  . gabapentin (NEURONTIN) capsule 300 mg  300 mg Oral TID Larene Pickett, PA-C   300 mg at 02/15/18 4259  . hydrochlorothiazide (MICROZIDE) capsule 12.5 mg  12.5 mg  Oral Daily Mabe, Forbes Cellar, MD   12.5 mg at 02/15/18 5638  . lisinopril (PRINIVIL,ZESTRIL) tablet 10 mg  10 mg Oral Daily Mabe, Forbes Cellar, MD   10 mg at 02/15/18 7564  . metFORMIN (GLUCOPHAGE) tablet 500 mg  500 mg Oral BID WC Larene Pickett, PA-C   500 mg at 02/15/18 3329  . paliperidone (INVEGA) 24 hr tablet 3 mg  3 mg Oral Daily Larene Pickett, PA-C   3 mg at 02/15/18 5188   Current Outpatient Medications  Medication Sig Dispense Refill  . amLODipine (NORVASC) 10 MG tablet Take 1 tablet (10 mg total) by mouth daily. (Patient not taking: Reported on 02/15/2018) 30 tablet 0  . DULoxetine (CYMBALTA) 30 MG capsule Take 1 capsule (30 mg total) by mouth 2 (two) times daily. (Patient not taking: Reported on 02/15/2018) 60 capsule 0  . gabapentin (NEURONTIN) 300 MG capsule Take 1 capsule (300 mg total) by mouth 3 (three) times daily. (Patient not taking: Reported on 02/15/2018) 90 capsule 0  . [START ON 02/16/2018] hydrochlorothiazide (MICROZIDE) 12.5 MG capsule Take 1 capsule (12.5 mg total) by mouth daily. 30 capsule 0  . lisinopril-hydrochlorothiazide (PRINZIDE,ZESTORETIC) 10-12.5 MG tablet Take 1 tablet by mouth daily. (Patient not taking: Reported on 02/15/2018) 30 tablet 0  . metFORMIN (GLUCOPHAGE) 500 MG tablet Take 1 tablet (500 mg total) by mouth 2 (two) times daily with a meal. (Patient not taking: Reported on 02/15/2018) 60 tablet 0  . paliperidone (INVEGA) 3 MG 24 hr tablet Take 1 tablet (3 mg total) by mouth daily. (Patient not taking: Reported on 02/15/2018) 30 tablet 0    Musculoskeletal: Strength & Muscle Tone: within normal limits Gait & Station: normal Patient leans: N/A  Psychiatric Specialty Exam: Physical Exam  Constitutional: He is oriented to person, place, and time. He appears well-developed and well-nourished.  HENT:  Head: Normocephalic.  Neck: Normal range of motion.  Respiratory: Effort normal.  Musculoskeletal: Normal range of motion.  Neurological: He is alert and  oriented to person, place, and time.  Psychiatric: He has a normal mood and affect. His speech is normal and behavior is normal. Judgment and thought content normal. Cognition and memory are normal.    Review of Systems  Psychiatric/Behavioral: Positive for substance abuse.  All other systems reviewed and are negative.   Blood pressure (!) 154/104, pulse 95, temperature 98.5 F (36.9 C), temperature source Oral, resp. rate 20, height '5\' 11"'  (1.803 m), weight 86.2 kg (190 lb), SpO2 99 %.Body mass index is 26.5 kg/m.  General Appearance: Casual  Eye Contact:  Good  Speech:  Normal Rate  Volume:  Normal  Mood:  Euthymic  Affect:  Congruent  Thought Process:  Coherent and Descriptions of Associations: Intact  Orientation:  Full (Time, Place, and Person)  Thought Content:  WDL and Logical  Suicidal Thoughts:  No  Homicidal Thoughts:  No  Memory:  Immediate;   Good Recent;   Good Remote;   Good  Judgement:  Fair  Insight:  Fair  Psychomotor Activity:  Normal  Concentration:  Concentration: Good and Attention Span: Good  Recall:  Good  Fund of Knowledge:  Fair  Language:  Good  Akathisia:  No  Handed:  Right  AIMS (if indicated):     Assets:  Leisure Time Physical Health Resilience Social Support  ADL's:  Intact  Cognition:  WNL  Sleep:        Treatment Plan Summary: Daily contact with patient to assess and evaluate symptoms and progress in treatment, Medication management and Plan alcohol abuse with alcohol induced mood disorder:  -Crisis stabilization -Medication management:  Medical medications continued along with Invega 3 mg daily for schizoaffective disorder, Cymbalta 30 mg BID for depression, and gabapentin 300 mg TID for substance abuse -Individual counseling  Disposition: No evidence of imminent risk to self or others at present.    Waylan Boga, NP 02/15/2018 10:45 AM  Patient seen face-to-face for psychiatric evaluation, chart reviewed and case discussed with  the physician extender and developed treatment plan. Reviewed the information documented and agree with the treatment plan. Corena Pilgrim, MD

## 2018-02-15 NOTE — ED Notes (Addendum)
On the phone, talking to his sister

## 2018-02-15 NOTE — ED Notes (Addendum)
Dr Eulis Foster up dated with pt's BP,  May dc patient

## 2018-02-15 NOTE — ED Notes (Addendum)
Written dc instructions again reviewed with pt.  Pt encouraged to pick up his medications tomorrow and take as directed and contact Aaron Edelman tomorrow for follow up. Pt also encouraged to seek treatment for suicidal thoughts, urges or changes.  Pt verbalized understanding.  Pt ambulatory w/o difficulty to dc area with mHt, belongings returned after leaving the area.

## 2018-02-15 NOTE — ED Notes (Signed)
Pt moved to SAPPU per orders. Pt ambulated without difficulty; no distress noted.

## 2018-02-15 NOTE — ED Notes (Signed)
Pt sleeping, will dc after lunch.

## 2018-02-15 NOTE — ED Notes (Signed)
SBAR Report received from previous nurse. Pt received calm and visible on unit. Pt denies current HI,V H at this time, but endorses passive SI at this time along with pain, anxiety, depression and auditory hallucinations at this time but appears otherwise stable and free of distress. Pt reminded of camera surveillance, q 15 min rounds, and rules of the milieu. Will continue to assess.

## 2018-02-15 NOTE — ED Notes (Signed)
Eating breakfast, pt reports he has been of his BP medication for 2-3 weeks
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
## 2018-02-15 NOTE — ED Notes (Signed)
Peer support into see

## 2018-02-15 NOTE — [facility] Suicide Risk Assessment (Signed)
Suicide Risk Assessment  Discharge Assessment   [facility] Discharge Suicide Risk Assessment   Principal Problem: Alcohol abuse with alcohol-induced mood disorder [facility]) Discharge Diagnoses:  Patient Active Problem List   Diagnosis Date Noted  . Cocaine abuse ([place]) [F14.10]     Priority: High  . Cocaine abuse with cocaine-induced mood disorder [facility]) [F14.14] 12/05/2016    Priority: High  . Alcohol use disorder, severe, dependence ([place]) [F10.20] 08/30/2015    Priority: High  . Alcohol abuse with alcohol-induced mood disorder ([place]) [F10.14] 08/30/2015    Priority: High  . Schizoaffective disorder, depressive type ([place]) [F25.1]     Priority: High  . Acute systolic heart failure ([place]) [I50.21] 02/02/2018  . Chest pain [R07.9] 01/31/2018  . Hypokalemia [E87.6]   . Schizoaffective disorder, bipolar type ([place]) [F25.0] 11/22/2017  . Trauma [T14.90XA]   . Tachycardia [R00.0] 03/03/2017  . Alcohol abuse [F10.10]   . Hypertensive urgency [I16.0]   . Tachycardia [R00.0]   . Cerebrovascular disease [I67.9] 09/02/2015  . 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 Malignant hypertension [I10] 08/30/2015  . Alcohol withdrawal ([place]) [F10.239] 08/30/2015  . Thrombocytopenia ([place]) [D69.6] 08/27/2015  . Tobacco use disorder [F17.200] 08/26/2015  . Hyperlipidemia [E78.5] 04/28/2015  . Gout [M10.9] 08/09/2013  . Diabetes mellitus ([employer]) [E11.9] 07/09/2012    Total Time spent with patient: 45 minutes   Musculoskeletal: Strength & Muscle Tone: within normal limits Gait & Station: normal Patient leans: N/A  Psychiatric Specialty Exam: Physical Exam  Constitutional: He is oriented to person, place, and time. He appears well-developed and well-nourished.  HENT:  Head: Normocephalic.  Neck: Normal range of motion.  Respiratory: Effort normal.  Musculoskeletal: Normal range of motion.  Neurological: He is alert and oriented to person, place, and  time.  Psychiatric: He has a normal mood and affect. His speech is normal and behavior is normal. Judgment and thought content normal. Cognition and memory are normal.    Review of Systems  Psychiatric/Behavioral: Positive for substance abuse.  All other systems reviewed and are negative.   Blood pressure (!) 154/104, pulse 95, temperature 98.5 F (36.9 C), temperature source Oral, resp. rate 20, height 5\' 11"  (1.803 m), weight 86.2 kg (190 lb), SpO2 99 %.Body mass index is 26.5 kg/m.  General Appearance: Casual  Eye Contact:  Good  Speech:  Normal Rate  Volume:  Normal  Mood:  Euthymic  Affect:  Congruent  Thought Process:  Coherent and Descriptions of Associations: Intact  Orientation:  Full (Time, Place, and Person)  Thought Content:  WDL and Logical  Suicidal Thoughts:  No  Homicidal Thoughts:  No  Memory:  Immediate;   Good Recent;   Good Remote;   Good  Judgement:  Fair  Insight:  Fair  Psychomotor Activity:  Normal  Concentration:  Concentration: Good and Attention Span: Good  Recall:  Good  Fund of Knowledge:  Fair  Language:  Good  Akathisia:  No  Handed:  Right  AIMS (if indicated):     Assets:  Leisure Time Physical Health Resilience Social Support  ADL's:  Intact  Cognition:  WNL  Sleep:       Mental Status Per Nursing Assessment::   On Admission:   alcohol and cocaine abuse with suicidal ideations  Demographic Factors:  Male  Loss Factors: NA  Historical Factors: NA  Risk Reduction Factors:   Sense of responsibility to family, Positive social support and Positive therapeutic relationship  Continued Clinical Symptoms:  None  Cognitive Features That Contribute To Risk:  None    Suicide Risk:  Minimal: No identifiable suicidal ideation.  Patients presenting with no risk factors but with morbid ruminations; may be classified as minimal risk based on the severity of the depressive symptoms    Plan Of Care/Follow-up recommendations:   Activity:  as tolerated Diet:  heart healthy diet  Omarri Eich, NP 02/15/2018, 10:57 AM

## 2018-02-17 ENCOUNTER — Encounter (HOSPITAL_COMMUNITY): Payer: Self-pay | Admitting: *Deleted

## 2018-02-17 ENCOUNTER — Other Ambulatory Visit: Payer: Self-pay

## 2018-02-17 ENCOUNTER — Emergency Department (HOSPITAL_COMMUNITY)
Admission: EM | Admit: 2018-02-17 | Discharge: 2018-02-19 | Disposition: A | Discharge status: Home / Self Care | Payer: Medicaid Other | Attending: Emergency Medicine | Admitting: Emergency Medicine

## 2018-02-17 DIAGNOSIS — E119 Type 2 diabetes mellitus without complications: Secondary | ICD-10-CM | POA: Insufficient documentation

## 2018-02-17 DIAGNOSIS — F1014 Alcohol abuse with alcohol-induced mood disorder: Secondary | ICD-10-CM | POA: Diagnosis not present

## 2018-02-17 DIAGNOSIS — Z9114 Patient's other noncompliance with medication regimen: Secondary | ICD-10-CM | POA: Diagnosis not present

## 2018-02-17 DIAGNOSIS — Z7984 Long term (current) use of oral hypoglycemic drugs: Secondary | ICD-10-CM | POA: Diagnosis not present

## 2018-02-17 DIAGNOSIS — R45851 Suicidal ideations: Secondary | ICD-10-CM | POA: Diagnosis not present

## 2018-02-17 DIAGNOSIS — I5022 Chronic systolic (congestive) heart failure: Secondary | ICD-10-CM | POA: Diagnosis not present

## 2018-02-17 DIAGNOSIS — R45850 Homicidal ideations: Secondary | ICD-10-CM | POA: Diagnosis not present

## 2018-02-17 DIAGNOSIS — F1414 Cocaine abuse with cocaine-induced mood disorder: Secondary | ICD-10-CM | POA: Insufficient documentation

## 2018-02-17 DIAGNOSIS — R440 Auditory hallucinations: Secondary | ICD-10-CM | POA: Diagnosis present

## 2018-02-17 DIAGNOSIS — F250 Schizoaffective disorder, bipolar type: Secondary | ICD-10-CM | POA: Diagnosis present

## 2018-02-17 DIAGNOSIS — F1020 Alcohol dependence, uncomplicated: Secondary | ICD-10-CM | POA: Diagnosis present

## 2018-02-17 DIAGNOSIS — I110 Hypertensive heart disease with heart failure: Secondary | ICD-10-CM | POA: Diagnosis not present

## 2018-02-17 DIAGNOSIS — F17210 Nicotine dependence, cigarettes, uncomplicated: Secondary | ICD-10-CM | POA: Insufficient documentation

## 2018-02-17 LAB — COMPREHENSIVE METABOLIC PANEL
ALBUMIN: 3.6 g/dL (ref 3.5–5.0)
ALK PHOS: 73 U/L (ref 38–126)
ALT: 32 U/L (ref 17–63)
AST: 64 U/L — AB (ref 15–41)
Anion gap: 10 (ref 5–15)
BILIRUBIN TOTAL: 0.7 mg/dL (ref 0.3–1.2)
BUN: 15 mg/dL (ref 6–20)
CALCIUM: 9 mg/dL (ref 8.9–10.3)
CO2: 26 mmol/L (ref 22–32)
Chloride: 104 mmol/L (ref 101–111)
Creatinine, Ser: 0.9 mg/dL (ref 0.61–1.24)
GFR calc Af Amer: >60 mL/min (ref >60)
GLUCOSE: 171 mg/dL — AB (ref 65–99)
Potassium: 3.9 mmol/L (ref 3.5–5.1)
Sodium: 140 mmol/L (ref 135–145)
TOTAL PROTEIN: 8 g/dL (ref 6.5–8.1)

## 2018-02-17 LAB — CBC WITH DIFFERENTIAL/PLATELET
Basophils Absolute: 0 10*3/uL (ref 0.0–0.1)
Basophils Relative: 1 %
EOS ABS: 0.2 10*3/uL (ref 0.0–0.7)
Eosinophils Relative: 3 %
HEMATOCRIT: 40.5 % (ref 39.0–52.0)
Hemoglobin: 13.4 g/dL (ref 13.0–17.0)
LYMPHS ABS: 2.6 10*3/uL (ref 0.7–4.0)
LYMPHS PCT: 40 %
MCH: 30.1 pg (ref 26.0–34.0)
MCHC: 33.1 g/dL (ref 30.0–36.0)
MCV: 91 fL (ref 78.0–100.0)
Monocytes Absolute: 0.3 10*3/uL (ref 0.1–1.0)
Monocytes Relative: 5 %
NEUTROS ABS: 3.4 10*3/uL (ref 1.7–7.7)
NEUTROS PCT: 51 %
Platelets: 176 10*3/uL (ref 150–400)
RBC: 4.45 MIL/uL (ref 4.22–5.81)
RDW: 12.2 % (ref 11.5–15.5)
WBC: 6.5 10*3/uL (ref 4.0–10.5)

## 2018-02-17 LAB — RAPID URINE DRUG SCREEN, HOSP PERFORMED
Amphetamines: NOT DETECTED
Barbiturates: NOT DETECTED
Benzodiazepines: POSITIVE — AB
Cocaine: POSITIVE — AB
OPIATES: NOT DETECTED
Tetrahydrocannabinol: NOT DETECTED

## 2018-02-17 LAB — ACETAMINOPHEN LEVEL: Acetaminophen (Tylenol), Serum: <10 ug/mL — ABNORMAL LOW (ref 10–30)

## 2018-02-17 LAB — ETHANOL: ALCOHOL ETHYL (B): 114 mg/dL — AB (ref <10)

## 2018-02-17 LAB — SALICYLATE LEVEL: Salicylate Lvl: <7 mg/dL (ref 2.8–30.0)

## 2018-02-17 MED ORDER — AMLODIPINE BESYLATE 5 MG PO TABS
10.0000 mg | ORAL_TABLET | Freq: Every day | ORAL | Status: DC
  Administered 2018-02-17 – 2018-02-19 (×3): 10 mg via ORAL
  Filled 2018-02-17 (×4): qty 2

## 2018-02-17 MED ORDER — METFORMIN HCL 500 MG PO TABS
500.0000 mg | ORAL_TABLET | Freq: Two times a day (BID) | ORAL | Status: DC
  Administered 2018-02-17 – 2018-02-19 (×4): 500 mg via ORAL
  Filled 2018-02-17 (×4): qty 1

## 2018-02-17 MED ORDER — DULOXETINE HCL 30 MG PO CPEP
30.0000 mg | ORAL_CAPSULE | Freq: Every day | ORAL | Status: DC
  Administered 2018-02-17 – 2018-02-19 (×3): 30 mg via ORAL
  Filled 2018-02-17 (×4): qty 1

## 2018-02-17 MED ORDER — PALIPERIDONE ER 3 MG PO TB24
3.0000 mg | ORAL_TABLET | Freq: Every day | ORAL | Status: DC
  Administered 2018-02-17 – 2018-02-19 (×3): 3 mg via ORAL
  Filled 2018-02-17 (×3): qty 1

## 2018-02-17 MED ORDER — VITAMIN B-1 100 MG PO TABS
100.0000 mg | ORAL_TABLET | Freq: Every day | ORAL | Status: DC
  Administered 2018-02-17 – 2018-02-19 (×3): 100 mg via ORAL
  Filled 2018-02-17 (×4): qty 1

## 2018-02-17 MED ORDER — HYDROCHLOROTHIAZIDE 12.5 MG PO CAPS
12.5000 mg | ORAL_CAPSULE | Freq: Every day | ORAL | Status: DC
  Administered 2018-02-17 – 2018-02-19 (×3): 12.5 mg via ORAL
  Filled 2018-02-17 (×3): qty 1

## 2018-02-17 MED ORDER — LORAZEPAM 2 MG/ML IJ SOLN: 0.0000 mg | Freq: Two times a day (BID) | INTRAMUSCULAR | Status: DC

## 2018-02-17 MED ORDER — LISINOPRIL 10 MG PO TABS
10.0000 mg | ORAL_TABLET | Freq: Every day | ORAL | Status: DC
  Administered 2018-02-17 – 2018-02-19 (×3): 10 mg via ORAL
  Filled 2018-02-17 (×3): qty 1

## 2018-02-17 MED ORDER — LORAZEPAM 1 MG PO TABS
0.0000 mg | ORAL_TABLET | Freq: Four times a day (QID) | ORAL | Status: DC
  Administered 2018-02-17: 2 mg via ORAL
  Administered 2018-02-17: 1 mg via ORAL
  Administered 2018-02-18: 2 mg via ORAL
  Administered 2018-02-18 – 2018-02-19 (×3): 1 mg via ORAL
  Filled 2018-02-17: qty 1
  Filled 2018-02-17 (×2): qty 2
  Filled 2018-02-17 (×2): qty 1

## 2018-02-17 MED ORDER — THIAMINE HCL 100 MG/ML IJ SOLN: 100.0000 mg | Freq: Every day | INTRAMUSCULAR | Status: DC

## 2018-02-17 MED ORDER — LORAZEPAM 1 MG PO TABS
0.0000 mg | ORAL_TABLET | Freq: Two times a day (BID) | ORAL | Status: DC
  Filled 2018-02-17: qty 1

## 2018-02-17 MED ORDER — GABAPENTIN 300 MG PO CAPS
300.0000 mg | ORAL_CAPSULE | Freq: Three times a day (TID) | ORAL | Status: DC
  Administered 2018-02-17 – 2018-02-19 (×6): 300 mg via ORAL
  Filled 2018-02-17 (×7): qty 1

## 2018-02-17 MED ORDER — LORAZEPAM 2 MG/ML IJ SOLN: 0.0000 mg | Freq: Four times a day (QID) | INTRAMUSCULAR | Status: DC

## 2018-02-17 NOTE — ED Triage Notes (Signed)
02/17/18 Per EMS Patient states " If he does not go to [facility] He is going to go to [place] and to [employer] and shoot up the place."  Per EMS patient is HI and SI. Patient requesting BP meds.

## 2018-02-17 NOTE — ED Provider Notes (Signed)
[facility] DEPT Provider Note   CSN: [id number] Arrival date & time: 02/17/18  1242     History   Chief Complaint Chief Complaint  Patient presents with  . Suicidal    HPI [name] is a 57 y.o. male with a past medical history of alcohol abuse, polysubstance abuse, schizophrenia, hypertension, diabetes, who presents to ED for suicidal and homicidal ideations.  He tells me that he is going to [place] and shooting up everyone there.  He is doing this because the voices in his head are telling him to end because they do not give him his blood pressure medication when he asked.  He states that he is also suicidal but denies any particular plan.  He has not had his hypertension medications in several days.  He reports auditory hallucinations but denies any visual hallucinations.  Remainder of history is limited due to patient's agitation.  HPI  Past Medical History:  Diagnosis Date  . Alcohol abuse   . Arthritis   . Crack cocaine use   . Depression   . Diabetes mellitus   . Drug abuse ([place])   . DTs (delirium tremens) ([place])    history of   . Gout   . Noncompliance with medication regimen   . Schizophrenia ([place])   . Stroke ([place])   . Systolic heart failure secondary to hypertension ([place])   . Uncontrolled hypertension     Patient Active Problem List   Diagnosis Date Noted  . Acute systolic heart failure ([place]) 02/02/2018  . Chest pain 01/31/2018  . Hypokalemia   . Schizoaffective disorder, bipolar type ([place]) 11/22/2017  . Trauma   . Tachycardia 03/03/2017  . Alcohol abuse   . Cocaine abuse ([place])   . Hypertensive urgency   . Tachycardia   . Cocaine abuse with cocaine-induced mood disorder ([place]) 12/05/2016  . Cerebrovascular disease 09/02/2015  . 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 Malignant hypertension 08/30/2015  . Alcohol use disorder, severe, dependence ([place]) 08/30/2015  .  Alcohol withdrawal ([place]) 08/30/2015  . Alcohol abuse with alcohol-induced mood disorder ([place]) 08/30/2015  . Thrombocytopenia ([place]) 08/27/2015  . Tobacco use disorder 08/26/2015  . Hyperlipidemia 04/28/2015  . Schizoaffective disorder, depressive type ([place])   . Gout 08/09/2013  . Diabetes mellitus ([place]) 07/09/2012    Past Surgical History:  Procedure Laterality Date  . LEFT HEART CATH AND CORONARY ANGIOGRAPHY N/A 02/03/2018   Procedure: LEFT HEART CATH AND CORONARY ANGIOGRAPHY;  Surgeon: Jettie Booze, MD;  Location: [facility];  Service: Cardiovascular;  Laterality: N/A;  . MANDIBLE RECONSTRUCTION    . TONSILLECTOMY          Home Medications    Prior to Admission medications   Medication Sig Start Date End Date Taking? Authorizing Provider  amLODipine (NORVASC) 10 MG tablet Take 1 tablet (10 mg total) by mouth daily. Patient not taking: Reported on 02/17/2018 02/15/18   Patrecia Pour, NP  DULoxetine (CYMBALTA) 30 MG capsule Take 1 capsule (30 mg total) by mouth 2 (two) times daily. Patient not taking: Reported on 02/17/2018 02/15/18   Patrecia Pour, NP  gabapentin (NEURONTIN) 300 MG capsule Take 1 capsule (300 mg total) by mouth 3 (three) times daily. Patient not taking: Reported on 02/17/2018 02/15/18   Patrecia Pour, NP  hydrochlorothiazide (MICROZIDE) 12.5 MG capsule Take 1 capsule (12.5 mg total) by mouth daily. Patient not taking: Reported on 02/17/2018 02/16/18   Patrecia Pour,  NP  lisinopril (PRINIVIL,ZESTRIL) 10 MG tablet Take 1 tablet (10 mg total) by mouth daily. Patient not taking: Reported on 02/17/2018 02/16/18   Patrecia Pour, NP  metFORMIN (GLUCOPHAGE) 500 MG tablet Take 1 tablet (500 mg total) by mouth 2 (two) times daily with a meal. Patient not taking: Reported on 02/17/2018 02/15/18   Patrecia Pour, NP  paliperidone (INVEGA) 3 MG 24 hr tablet Take 1 tablet (3 mg total) by mouth daily. Patient not taking: Reported on 02/17/2018 02/15/18   Patrecia Pour, NP     Family History Family History  Problem Relation Age of Onset  . Hypertension Unknown     Social History Social History   Tobacco Use  . Smoking status: Current Every Day Smoker    Packs/day: 0.25    Types: Cigarettes  . Smokeless tobacco: Never Used  Substance Use Topics  . Alcohol use: Yes    Alcohol/week: 0.6 - 1.2 oz    Types: 1 - 2 Cans of beer per week    Comment: 2 cases of beer a day + liquour  . Drug use: Yes    Types: "Crack" cocaine, Cocaine     Allergies   Patient has no known allergies.   Review of Systems Review of Systems  Constitutional: Negative for appetite change, chills and fever.  HENT: Negative for ear pain, rhinorrhea, sneezing and sore throat.   Eyes: Negative for photophobia and visual disturbance.  Respiratory: Negative for cough, chest tightness, shortness of breath and wheezing.   Cardiovascular: Negative for chest pain and palpitations.  Gastrointestinal: Negative for abdominal pain, blood in stool, constipation, diarrhea, nausea and vomiting.  Genitourinary: Negative for dysuria, hematuria and urgency.  Musculoskeletal: Negative for myalgias.  Skin: Negative for rash.  Neurological: Negative for dizziness, weakness and light-headedness.  Psychiatric/Behavioral: Positive for agitation, hallucinations and suicidal ideas. Negative for confusion. The patient is not nervous/anxious.      Physical Exam Updated Vital Signs BP (!) 189/124 (BP Location: Right Arm)   Pulse 96   Temp 98 F (36.7 C) (Oral)   Resp 18   Ht 5\' 11"  (1.803 m)   Wt 86.2 kg (190 lb)   SpO2 100%   BMI 26.50 kg/m   Physical Exam  Constitutional: He appears well-developed and well-nourished. No distress.  HENT:  Head: Normocephalic and atraumatic.  Nose: Nose normal.  Eyes: Conjunctivae and EOM are normal. Left eye exhibits no discharge. No scleral icterus.  Neck: Normal range of motion. Neck supple.  Cardiovascular: Normal rate, regular rhythm, normal  heart sounds and intact distal pulses. Exam reveals no gallop and no friction rub.  No murmur heard. Pulmonary/Chest: Effort normal and breath sounds normal. No respiratory distress.  Abdominal: Soft. Bowel sounds are normal. He exhibits no distension. There is no tenderness. There is no guarding.  Musculoskeletal: Normal range of motion. He exhibits no edema.  Neurological: He is alert. He exhibits normal muscle tone. Coordination normal.  Skin: Skin is warm and dry. No rash noted.  Psychiatric: His affect is labile. He is agitated. He expresses homicidal ideation. He expresses no suicidal ideation. He expresses homicidal plans. He expresses no suicidal plans.  Nursing note and vitals reviewed.    ED Treatments / Results  Labs (all labs ordered are listed, but only abnormal results are displayed) Labs Reviewed  COMPREHENSIVE METABOLIC PANEL - Abnormal; Notable for the following components:      Result Value   Glucose, Bld 171 (*)    AST  64 (*)    All other components within normal limits  ETHANOL - Abnormal; Notable for the following components:   Alcohol, Ethyl (B) 114 (*)    All other components within normal limits  ACETAMINOPHEN LEVEL - Abnormal; Notable for the following components:   Acetaminophen (Tylenol), Serum <10 (*)    All other components within normal limits  CBC WITH DIFFERENTIAL/PLATELET  SALICYLATE LEVEL  RAPID URINE DRUG SCREEN, HOSP PERFORMED  CBG MONITORING, ED    EKG None  Radiology No results found.  Procedures Procedures (including critical care time)  Medications Ordered in ED Medications  amLODipine (NORVASC) tablet 10 mg (10 mg Oral Given 02/17/18 1427)  lisinopril (PRINIVIL,ZESTRIL) tablet 10 mg (10 mg Oral Given 02/17/18 1424)  hydrochlorothiazide (MICROZIDE) capsule 12.5 mg (12.5 mg Oral Given 02/17/18 1424)  LORazepam (ATIVAN) injection 0-4 mg (has no administration in time range)    Or  LORazepam (ATIVAN) tablet 0-4 mg (has no administration  in time range)  LORazepam (ATIVAN) injection 0-4 mg (has no administration in time range)    Or  LORazepam (ATIVAN) tablet 0-4 mg (has no administration in time range)  thiamine (VITAMIN B-1) tablet 100 mg (has no administration in time range)    Or  thiamine (B-1) injection 100 mg (has no administration in time range)     Initial Impression / Assessment and Plan / ED Course  I have reviewed the triage vital signs and the nursing notes.  Pertinent labs & imaging results that were available during my care of the patient were reviewed by me and considered in my medical decision making (see chart for details).     Patient, with a past medical history of alcohol abuse, polysubstance abuse, schizophrenia, hypertension, diabetes who presents to ED for evaluation of suicidal and homicidal ideations.  He tells me that he is going to [place] and shooting up everyone there because the voices in his head are telling him to.  States this because last time he was there did not give him his blood pressure medication when he asked.  He does make to being suicidal but denies any particular plan.  He has not had his hypertension medication several days.  Patient is somewhat agitated during my examination so difficult to obtain remainder of history.  He does have any chest pain or shortness of breath.  Screening lab work significant for alcohol level of 114.  Patient unable to tell me when his last drink was.  She is medically cleared for TTS evaluation.  TTS will be consulted and patient will be placed in psych hold with CIWA protocol.  Portions of this note were generated with [employer]. Dictation errors may occur despite best attempts at proofreading.  Final Clinical Impressions(s) / ED Diagnoses   Final diagnoses:  None    ED Discharge Orders    None       Delia Heady, PA-C 02/17/18 1523    Virgel Manifold, MD 02/18/18 [phone]
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
## 2018-02-17 NOTE — ED Notes (Signed)
Patient seen resting

## 2018-02-17 NOTE — Progress Notes (Signed)
02/17/18 Per EMS Patient states " If he does not go to [facility] He is going to go to [place] and to [employer] and shoot up the place."  Per EMS patient is HI and SI. Patient requesting BP meds.

## 2018-02-17 NOTE — ED Notes (Signed)
Patient sleeping most of the shift. Endorses mild depression of 4/10. Contracts for safety. Denies pain, SI/HI, AH/VH. Verbalized no concern. No behavior issues noted.  Staff offered support and encouragement as needed. Routine safety checks maintained. Will continue to monitor patient. Patient remains safe on unit.

## 2018-02-18 ENCOUNTER — Encounter (HOSPITAL_COMMUNITY): Payer: Self-pay | Admitting: Emergency Medicine

## 2018-02-18 DIAGNOSIS — R450 Nervousness: Secondary | ICD-10-CM

## 2018-02-18 DIAGNOSIS — F329 Major depressive disorder, single episode, unspecified: Secondary | ICD-10-CM

## 2018-02-18 DIAGNOSIS — R4587 Impulsiveness: Secondary | ICD-10-CM

## 2018-02-18 DIAGNOSIS — F1010 Alcohol abuse, uncomplicated: Secondary | ICD-10-CM | POA: Diagnosis not present

## 2018-02-18 DIAGNOSIS — R451 Restlessness and agitation: Secondary | ICD-10-CM | POA: Diagnosis not present

## 2018-02-18 DIAGNOSIS — R440 Auditory hallucinations: Secondary | ICD-10-CM

## 2018-02-18 DIAGNOSIS — R45851 Suicidal ideations: Secondary | ICD-10-CM

## 2018-02-18 DIAGNOSIS — F1390 Sedative, hypnotic, or anxiolytic use, unspecified, uncomplicated: Secondary | ICD-10-CM

## 2018-02-18 DIAGNOSIS — Y905 Blood alcohol level of 100-119 mg/100 ml: Secondary | ICD-10-CM | POA: Diagnosis not present

## 2018-02-18 DIAGNOSIS — F17210 Nicotine dependence, cigarettes, uncomplicated: Secondary | ICD-10-CM

## 2018-02-18 DIAGNOSIS — F1414 Cocaine abuse with cocaine-induced mood disorder: Secondary | ICD-10-CM | POA: Diagnosis not present

## 2018-02-18 DIAGNOSIS — R45850 Homicidal ideations: Secondary | ICD-10-CM

## 2018-02-18 DIAGNOSIS — F419 Anxiety disorder, unspecified: Secondary | ICD-10-CM | POA: Diagnosis not present

## 2018-02-18 DIAGNOSIS — Z653 Problems related to other legal circumstances: Secondary | ICD-10-CM

## 2018-02-18 NOTE — ED Notes (Signed)
SBAR Report received from previous nurse. Pt received calm and visible on unit. Pt denies current SI/ HI, A/V H or pain at this time, and appears otherwise stable and free of distress. Pt endorses depression and anxiety at this time.  Pt reminded of camera surveillance, q 15 min rounds, and rules of the milieu. Will continue to assess.

## 2018-02-18 NOTE — Consult Note (Addendum)
[facility] Face-to-Face Psychiatry Consult   Reason for Consult:  Homicidal ideation Referring Physician:  EDP Patient Identification: Tejon Gracie MRN:  [id number] Principal Diagnosis: Cocaine abuse with cocaine-induced mood disorder [facility]) Diagnosis:   Patient Active Problem List   Diagnosis Date Noted  . Acute systolic heart failure ([place]) [I50.21] 02/02/2018  . Chest pain [R07.9] 01/31/2018  . Hypokalemia [E87.6]   . Schizoaffective disorder, bipolar type ([place]) [F25.0] 11/22/2017  . Trauma [T14.90XA]   . Tachycardia [R00.0] 03/03/2017  . Alcohol abuse [F10.10]   . Cocaine abuse ([place]) [F14.10]   . Hypertensive urgency [I16.0]   . Tachycardia [R00.0]   . Cocaine abuse with cocaine-induced mood disorder ([place]) [F14.14] 12/05/2016  . Cerebrovascular disease [I67.9] 09/02/2015  . 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 Malignant hypertension [I10] 08/30/2015  . Alcohol use disorder, severe, dependence ([place]) [F10.20] 08/30/2015  . Alcohol withdrawal ([place]) [F10.239] 08/30/2015  . Alcohol abuse with alcohol-induced mood disorder ([place]) [F10.14] 08/30/2015  . Thrombocytopenia ([place]) [D69.6] 08/27/2015  . Tobacco use disorder [F17.200] 08/26/2015  . Hyperlipidemia [E78.5] 04/28/2015  . Schizoaffective disorder, depressive type ([place]) [F25.1]   . Gout [M10.9] 08/09/2013  . Diabetes mellitus ([place]) [E11.9] 07/09/2012    Total Time spent with patient: 45 minutes  Subjective:   [name] is a 57 y.o. male patient admitted with suicidal/homicidal ideation.  HPI:  Pt was seen and chart reviewed with treatment team and Dr Mariea Clonts. Pt denies visual hallucinations and does not appear to be responding to internal stimuli. Pt stated he went to [facility] to get his medications but they would not give them to him because he is signed up for an ACTT team. At this point Pt became suicidal and homicidal (towards [place]) and states "I have a  gun buried behind [place] and I will shoot the place up." Pt stated he hears voices that say everything but he can't really make out what they are saying. Pt has a court date on 4/17 for begging for alms. [name] UDS positve for cocaine and benzos, BAL 114. Pt also requesting to speak with Peer Support because he wants to go to [facility] for rehabilition for his substance abuse. Peer support has submitted his information to [facility] and pt is under review at this time. Pt will be observed for 24 hours in the emergency room and for possible discharge on 02-19-2018.   Past Psychiatric History: As above  Risk to Self: Suicidal Ideation: Yes-Currently Present Suicidal Intent: Yes-Currently Present Is patient at risk for suicide?: Yes Suicidal Plan?: Yes-Currently Present Specify Current Suicidal Plan: shoot himself after shooting [employer] employees Access to [employer]: Yes Specify Access to Suicidal Means: pt states gun is hidden near [place] What has been your use of drugs/alcohol within the last 12 months?: crack and etoh How many times?: 1 Other Self Harm Risks: fights with others Triggers for Past Attempts: Unknown Intentional Self Injurious Behavior: None Risk to Others: Homicidal Ideation: Yes-Currently Present Thoughts of Harm to Others: Yes-Currently Present Comment - Thoughts of Harm to Others: Pt angry with [employer] staff Current Homicidal Intent: Yes-Currently Present Current Homicidal Plan: (shoot) Describe Current Homicidal Plan: shoot [place] gun [place] Access to Homicidal Means: Yes Describe Access to Homicidal Means: gun hidden near [place] Identified Victim: [place] staff History of harm to others?: Yes(per pt) Assessment of Violence: In distant past Violent Behavior Description: Pt states he has killed people Does patient have access to weapons?: Yes (Comment) Criminal Charges Pending?: No Does patient  have a court date: No Prior Inpatient Therapy: Prior Inpatient Therapy:  Yes Prior Therapy Dates: 2019,2018, 2017 Prior Therapy Facilty/Provider(s): [facility] Prior Outpatient Therapy: Prior Outpatient Therapy: Yes Prior Therapy Dates: 2018 Prior Therapy Facilty/Provider(s): [name] Reason for Treatment: Medication management Does patient have an ACCT team?: Yes Does patient have Intensive In-House Services?  : No Does patient have [name] services? : Yes Does patient have P4CC services?: No  Past Medical History:  Past Medical History:  Diagnosis Date  . Alcohol abuse   . Arthritis   . Crack cocaine use   . Depression   . Diabetes mellitus   . Drug abuse ([place])   . DTs (delirium tremens) ([place])    history of   . Gout   . Noncompliance with medication regimen   . Schizophrenia ([place])   . Stroke ([place])   . Systolic heart failure secondary to hypertension ([place])   . Uncontrolled hypertension     Past Surgical History:  Procedure Laterality Date  . LEFT HEART CATH AND CORONARY ANGIOGRAPHY N/A 02/03/2018   Procedure: LEFT HEART CATH AND CORONARY ANGIOGRAPHY;  Surgeon: Jettie Booze, MD;  Location: [place] [facility];  Service: Cardiovascular;  Laterality: N/A;  . MANDIBLE RECONSTRUCTION    . TONSILLECTOMY     Family History:  Family History  Problem Relation Age of Onset  . Hypertension Unknown    Family Psychiatric  History: Unknown Social History:  Social History   Substance and Sexual Activity  Alcohol Use Yes  . Alcohol/week: 0.6 - 1.2 oz  . Types: 1 - 2 Cans of beer per week   Comment: 2 cases of beer a day + liquour     Social History   Substance and Sexual Activity  Drug Use Yes  . Types: "Crack" cocaine, Cocaine    Social History   Socioeconomic History  . Marital status: Legally Separated    Spouse name: Not on file  . Number of children: Not on file  . Years of education: Not on file  . Highest education level: Not on file  Occupational History  . Not on file  Social Needs  . Financial resource strain: Not on file   . Food insecurity:    Worry: Not on file    Inability: Not on file  . Transportation needs:    Medical: Not on file    Non-medical: Not on file  Tobacco Use  . Smoking status: Current Every Day Smoker    Packs/day: 0.25    Types: Cigarettes  . Smokeless tobacco: Never Used  Substance and Sexual Activity  . Alcohol use: Yes    Alcohol/week: 0.6 - 1.2 oz    Types: 1 - 2 Cans of beer per week    Comment: 2 cases of beer a day + liquour  . Drug use: Yes    Types: "Crack" cocaine, Cocaine  . Sexual activity: Not on file  Lifestyle  . Physical activity:    Days per week: Not on file    Minutes per session: Not on file  . Stress: Not on file  Relationships  . Social connections:    Talks on phone: Not on file    Gets together: Not on file    Attends religious service: Not on file    Active member of club or organization: Not on file    Attends meetings of clubs or organizations: Not on file    Relationship status: Not on file  Other Topics Concern  .  Not on file  Social History Narrative  . Not on file   Additional Social History: N/A    Allergies:  No Known Allergies  Labs:  Results for orders placed or performed during the hospital encounter of 02/17/18 (from the past 48 hour(s))  Comprehensive metabolic panel     Status: Abnormal   Collection Time: 02/17/18  2:02 PM  Result Value Ref Range   Sodium 140 135 - 145 mmol/L   Potassium 3.9 3.5 - 5.1 mmol/L   Chloride 104 101 - 111 mmol/L   CO2 26 22 - 32 mmol/L   Glucose, Bld 171 (H) 65 - 99 mg/dL   BUN 15 6 - 20 mg/dL   Creatinine, Ser 0.90 0.61 - 1.24 mg/dL   Calcium 9.0 8.9 - 10.3 mg/dL   Total Protein 8.0 6.5 - 8.1 g/dL   Albumin 3.6 3.5 - 5.0 g/dL   AST 64 (H) 15 - 41 U/L   ALT 32 17 - 63 U/L   Alkaline Phosphatase 73 38 - 126 U/L   Total Bilirubin 0.7 0.3 - 1.2 mg/dL   GFR calc non Af Amer >60 >60 mL/min   GFR calc Af Amer >60 >60 mL/min    Comment: (NOTE) The eGFR has been calculated using the CKD EPI  equation. This calculation has not been validated in all clinical situations. eGFR's persistently <60 mL/min signify possible Chronic Kidney Disease.    Anion gap 10 5 - 15    Comment: Performed at [facility], [place] [address], [place], [place] [address]  CBC with Diff     Status: None   Collection Time: 02/17/18  2:02 PM  Result Value Ref Range   WBC 6.5 4.0 - 10.5 K/uL   RBC 4.45 4.22 - 5.81 MIL/uL   Hemoglobin 13.4 13.0 - 17.0 g/dL   HCT 40.5 39.0 - 52.0 %   MCV 91.0 78.0 - 100.0 fL   MCH 30.1 26.0 - 34.0 pg   MCHC 33.1 30.0 - 36.0 g/dL   RDW 12.2 11.5 - 15.5 %   Platelets 176 150 - 400 K/uL   Neutrophils Relative % 51 %   Neutro Abs 3.4 1.7 - 7.7 K/uL   Lymphocytes Relative 40 %   Lymphs Abs 2.6 0.7 - 4.0 K/uL   Monocytes Relative 5 %   Monocytes Absolute 0.3 0.1 - 1.0 K/uL   Eosinophils Relative 3 %   Eosinophils Absolute 0.2 0.0 - 0.7 K/uL   Basophils Relative 1 %   Basophils Absolute 0.0 0.0 - 0.1 K/uL    Comment: Performed at [facility], [place] [address], [place], [place] [address]  Ethanol     Status: Abnormal   Collection Time: 02/17/18  2:02 PM  Result Value Ref Range   Alcohol, Ethyl (B) 114 (H) <10 mg/dL    Comment:        LOWEST DETECTABLE LIMIT FOR SERUM ALCOHOL IS 10 mg/dL FOR MEDICAL PURPOSES ONLY Performed at [facility], [place] [address], [place], [place] [address]   Acetaminophen level     Status: Abnormal   Collection Time: 02/17/18  2:02 PM  Result Value Ref Range   Acetaminophen (Tylenol), Serum <10 (L) 10 - 30 ug/mL    Comment:        THERAPEUTIC CONCENTRATIONS VARY SIGNIFICANTLY. A RANGE OF 10-30 ug/mL MAY BE AN EFFECTIVE CONCENTRATION FOR MANY PATIENTS. HOWEVER, SOME ARE BEST TREATED AT CONCENTRATIONS OUTSIDE THIS RANGE. ACETAMINOPHEN CONCENTRATIONS >150 ug/mL AT  4 HOURS AFTER INGESTION AND >50 ug/mL AT 12 HOURS AFTER INGESTION ARE OFTEN ASSOCIATED WITH  TOXIC REACTIONS. Performed at [facility], [place] [address] [place], [place] [address]   Salicylate level     Status: None   Collection Time: 02/17/18  2:02 PM  Result Value Ref Range   Salicylate Lvl <7.8 2.8 - 30.0 mg/dL    Comment: Performed at [facility], [place] [address] [place], [place] [address]  Urine rapid drug screen (hosp performed)     Status: Abnormal   Collection Time: 02/17/18  2:30 PM  Result Value Ref Range   Opiates NONE DETECTED NONE DETECTED   Cocaine POSITIVE (A) NONE DETECTED   Benzodiazepines POSITIVE (A) NONE DETECTED   Amphetamines NONE DETECTED NONE DETECTED   Tetrahydrocannabinol NONE DETECTED NONE DETECTED   Barbiturates NONE DETECTED NONE DETECTED    Comment: (NOTE) DRUG SCREEN FOR MEDICAL PURPOSES ONLY.  IF CONFIRMATION IS NEEDED FOR ANY PURPOSE, NOTIFY LAB WITHIN 5 DAYS. LOWEST DETECTABLE LIMITS FOR URINE DRUG SCREEN Drug Class                     Cutoff (ng/mL) Amphetamine and metabolites    1000 Barbiturate and metabolites    200 Benzodiazepine                 094 Tricyclics and metabolites     300 Opiates and metabolites        300 Cocaine and metabolites        300 THC                            50 Performed at [facility], [place] [address] [place], [place] [address]     Current Facility-Administered Medications  Medication Dose Route Frequency Provider Last Rate Last Dose  . amLODipine (NORVASC) tablet 10 mg  10 mg Oral Daily Khatri, Hina, PA-C   10 mg at 02/18/18 1033  . DULoxetine (CYMBALTA) DR capsule 30 mg  30 mg Oral Daily Ethelene Hal, NP   30 mg at 02/18/18 1033  . gabapentin (NEURONTIN) capsule 300 mg  300 mg Oral TID Ethelene Hal, NP   300 mg at 02/18/18 1033  . hydrochlorothiazide (MICROZIDE) capsule 12.5 mg  12.5 mg Oral Daily Khatri, Hina, PA-C   12.5 mg at 02/18/18 1033  . lisinopril (PRINIVIL,ZESTRIL) tablet 10 mg  10 mg Oral Daily Khatri, Hina,  PA-C   10 mg at 02/18/18 1033  . LORazepam (ATIVAN) injection 0-4 mg  0-4 mg Intravenous Q6H Khatri, Hina, PA-C       Or  . LORazepam (ATIVAN) tablet 0-4 mg  0-4 mg Oral Q6H Khatri, Hina, PA-C   1 mg at 02/18/18 0816  . [START ON 02/19/2018] LORazepam (ATIVAN) injection 0-4 mg  0-4 mg Intravenous Q12H Khatri, Hina, PA-C       Or  . [START ON 02/19/2018] LORazepam (ATIVAN) tablet 0-4 mg  0-4 mg Oral Q12H Khatri, Hina, PA-C      . metFORMIN (GLUCOPHAGE) tablet 500 mg  500 mg Oral BID WC Ethelene Hal, NP   500 mg at 02/18/18 0813  . paliperidone (INVEGA) 24 hr tablet 3 mg  3 mg Oral Daily Ethelene Hal, NP   3 mg at 02/18/18 1033  . thiamine (VITAMIN B-1) tablet 100 mg  100 mg Oral Daily Khatri, Hina, PA-C   100 mg at  02/18/18 1033   Or  . thiamine (B-1) injection 100 mg  100 mg Intravenous Daily Khatri, Hina, PA-C       Current Outpatient Medications  Medication Sig Dispense Refill  . amLODipine (NORVASC) 10 MG tablet Take 1 tablet (10 mg total) by mouth daily. (Patient not taking: Reported on 02/17/2018) 30 tablet 0  . DULoxetine (CYMBALTA) 30 MG capsule Take 1 capsule (30 mg total) by mouth 2 (two) times daily. (Patient not taking: Reported on 02/17/2018) 60 capsule 0  . gabapentin (NEURONTIN) 300 MG capsule Take 1 capsule (300 mg total) by mouth 3 (three) times daily. (Patient not taking: Reported on 02/17/2018) 90 capsule 0  . hydrochlorothiazide (MICROZIDE) 12.5 MG capsule Take 1 capsule (12.5 mg total) by mouth daily. (Patient not taking: Reported on 02/17/2018) 30 capsule 0  . lisinopril (PRINIVIL,ZESTRIL) 10 MG tablet Take 1 tablet (10 mg total) by mouth daily. (Patient not taking: Reported on 02/17/2018) 30 tablet 0  . metFORMIN (GLUCOPHAGE) 500 MG tablet Take 1 tablet (500 mg total) by mouth 2 (two) times daily with a meal. (Patient not taking: Reported on 02/17/2018) 60 tablet 0  . paliperidone (INVEGA) 3 MG 24 hr tablet Take 1 tablet (3 mg total) by mouth daily. (Patient not taking:  Reported on 02/17/2018) 30 tablet 0    Musculoskeletal: Strength & Muscle Tone: within normal limits Gait & Station: normal Patient leans: N/A  Psychiatric Specialty Exam: Physical Exam  Nursing note and vitals reviewed. Constitutional: He is oriented to person, place, and time. He appears well-developed and well-nourished.  HENT:  Head: Normocephalic and atraumatic.  Neck: Normal range of motion.  Respiratory: Effort normal.  Musculoskeletal: Normal range of motion.  Neurological: He is alert and oriented to person, place, and time.  Psychiatric: His speech is normal. His mood appears anxious. He is agitated. Thought content is paranoid. Cognition and memory are normal. He expresses impulsivity. He exhibits a depressed mood. He expresses homicidal and suicidal ideation.    Review of Systems  Psychiatric/Behavioral: Positive for depression, substance abuse and suicidal ideas. Negative for hallucinations and memory loss. The patient is nervous/anxious. The patient does not have insomnia.   All other systems reviewed and are negative.   Blood pressure (!) 156/112, pulse 89, temperature 98.6 F (37 C), temperature source Oral, resp. rate 16, height _0  (1.803 m), weight 86.2 kg (190 lb), SpO2 98 %.Body mass index is 26.5 kg/m.  General Appearance: Casual  Eye Contact:  Fair  Speech:  Clear and Coherent  Volume:  Decreased  Mood:  Depressed  Affect:  Congruent and Depressed  Thought Process:  Coherent  Orientation:  Full (Time, Place, and Person)  Thought Content:  Ideas of Reference:   Paranoia and Rumination  Suicidal Thoughts:  Yes.  with intent/plan  Homicidal Thoughts:  Yes.  with intent/plan  Memory:  Immediate;   Good Recent;   Good Remote;   Fair  Judgement:  Poor  Insight:  Lacking  Psychomotor Activity:  Normal  Concentration:  Concentration: Good and Attention Span: Good  Recall:  Good  Fund of Knowledge:  Good  Language:  Good  Akathisia:  No  Handed:  Right   AIMS (if indicated):   N/A  Assets:  Agricultural consultant Housing  ADL's:  Intact  Cognition:  WNL  Sleep:   N/A     Treatment Plan Summary: Daily contact with patient to assess and evaluate symptoms and progress in treatment and Medication management ( see  MAR )  Disposition: Pt will be observed in the ED for 24 hours for medication effectiveness and safety. For possible discharge on 02-19-2018   Ethelene Hal, NP 02/18/2018 12:16 PM   Patient seen face-to-face for psychiatric evaluation, chart reviewed and case discussed with the physician extender and developed treatment plan. Reviewed the information documented and agree with the treatment plan.  Buford Dresser, DO 02/18/18 7:02 PM

## 2018-02-19 DIAGNOSIS — F1014 Alcohol abuse with alcohol-induced mood disorder: Secondary | ICD-10-CM | POA: Diagnosis not present

## 2018-02-19 DIAGNOSIS — R45851 Suicidal ideations: Secondary | ICD-10-CM | POA: Diagnosis not present

## 2018-02-19 DIAGNOSIS — F1414 Cocaine abuse with cocaine-induced mood disorder: Secondary | ICD-10-CM | POA: Diagnosis not present

## 2018-02-19 DIAGNOSIS — F17210 Nicotine dependence, cigarettes, uncomplicated: Secondary | ICD-10-CM | POA: Diagnosis not present

## 2018-02-19 LAB — CBG MONITORING, ED
GLUCOSE-CAPILLARY: 171 mg/dL — AB (ref 65–99)
Glucose-Capillary: 299 mg/dL — ABNORMAL HIGH (ref 65–99)

## 2018-02-19 MED ORDER — GABAPENTIN 300 MG PO CAPS: 300.0000 mg | ORAL_CAPSULE | Freq: Three times a day (TID) | ORAL | 0 refills | Status: DC

## 2018-02-19 NOTE — Patient Outreach (Signed)
[facility] provided the patient with substance use recovery resources and bus passes. These resources include NA/AA meeting list, residential/outpatient substance use treatment center, [facility] contact information, and information for [facility]. [facility] tried to get the patient into [facility], but the patient could not be accepted at this time. [facility] has met with the patient on 02/15/18, 12/20/17 as well.

## 2018-02-19 NOTE — BHH Suicide Risk Assessment (Signed)
Suicide Risk Assessment  Discharge Assessment   [facility] Discharge Suicide Risk Assessment   Principal Problem: Cocaine abuse with cocaine-induced mood disorder [facility]) Discharge Diagnoses:  Patient Active Problem List   Diagnosis Date Noted  . Cocaine abuse ([place]) [F14.10]     Priority: High  . Cocaine abuse with cocaine-induced mood disorder [facility]) [F14.14] 12/05/2016    Priority: High  . Alcohol use disorder, severe, dependence ([place]) [F10.20] 08/30/2015    Priority: High  . Alcohol abuse with alcohol-induced mood disorder ([place]) [F10.14] 08/30/2015    Priority: High  . Schizoaffective disorder, depressive type ([place]) [F25.1]     Priority: High  . Acute systolic heart failure ([place]) [I50.21] 02/02/2018  . Chest pain [R07.9] 01/31/2018  . Hypokalemia [E87.6]   . Schizoaffective disorder, bipolar type ([place]) [F25.0] 11/22/2017  . Trauma [T14.90XA]   . Tachycardia [R00.0] 03/03/2017  . Alcohol abuse [F10.10]   . Hypertensive urgency [I16.0]   . Tachycardia [R00.0]   . Cerebrovascular disease [I67.9] 09/02/2015  . 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 Malignant hypertension [I10] 08/30/2015  . Alcohol withdrawal ([place]) [F10.239] 08/30/2015  . Thrombocytopenia ([place]) [D69.6] 08/27/2015  . Tobacco use disorder [F17.200] 08/26/2015  . Hyperlipidemia [E78.5] 04/28/2015  . Gout [M10.9] 08/09/2013  . Diabetes mellitus ([place]) [E11.9] 07/09/2012    Total Time spent with patient: 45 minutes  Musculoskeletal: Strength & Muscle Tone: within normal limits Gait & Station: normal Patient leans: N/A  Psychiatric Specialty Exam: Physical Exam  Constitutional: He is oriented to person, place, and time. He appears well-developed and well-nourished.  HENT:  Head: Normocephalic.  Neck: Normal range of motion.  Respiratory: Effort normal.  Musculoskeletal: Normal range of motion.  Neurological: He is alert and oriented to person, place, and  time.  Psychiatric: He has a normal mood and affect. His speech is normal and behavior is normal. Judgment and thought content normal. Cognition and memory are normal.    Review of Systems  Psychiatric/Behavioral: Positive for substance abuse.  All other systems reviewed and are negative.   Blood pressure (!) 161/117, pulse 92, temperature 98.2 F (36.8 C), temperature source Oral, resp. rate 16, height 5\' 11"  (1.803 m), weight 86.2 kg (190 lb), SpO2 96 %.Body mass index is 26.5 kg/m.  General Appearance: Casual  Eye Contact:  Good  Speech:  Normal Rate  Volume:  Normal  Mood:  Euthymic  Affect:  Congruent  Thought Process:  Coherent and Descriptions of Associations: Intact  Orientation:  Full (Time, Place, and Person)  Thought Content:  WDL and Logical  Suicidal Thoughts:  No  Homicidal Thoughts:  No  Memory:  Immediate;   Good Recent;   Good Remote;   Good  Judgement:  Fair  Insight:  Fair  Psychomotor Activity:  Normal  Concentration:  Concentration: Good and Attention Span: Good  Recall:  Good  Fund of Knowledge:  Fair  Language:  Good  Akathisia:  No  Handed:  Right  AIMS (if indicated):     Assets:  Leisure Time Physical Health Resilience Social Support  ADL's:  Intact  Cognition:  WNL  Sleep:       Mental Status Per Nursing Assessment::   On Admission:   cocaine abuse with suicidal ideations  Demographic Factors:  Male  Loss Factors: NA  Historical Factors: NA  Risk Reduction Factors:   Sense of responsibility to family, Positive social support and Positive therapeutic relationship  Continued Clinical Symptoms:  None  Cognitive Features That  Contribute To Risk:  None    Suicide Risk:  Minimal: No identifiable suicidal ideation.  Patients presenting with no risk factors but with morbid ruminations; may be classified as minimal risk based on the severity of the depressive symptoms    Plan Of Care/Follow-up recommendations:  Activity:  as  tolerated Diet:  heart healthy diet  Aleenah Homen, NP 02/19/2018, 11:21 AM

## 2018-02-19 NOTE — BH Assessment (Signed)
[facility] Assessment Progress Note  Per [name] Dresser, DO, this pt does not require psychiatric hospitalization at this time.  Pt reports receiving ACT Team services through [facility].  At 10:38 this Probation officer called the [employer] and spoke to [employer].  She reports that pt never completed the process to obtain ACT Team services.  The last time he presented with them, they informed him of this, and escorted him to the walk-in clinic for routine outpatient services.  Discharge instructions advise pt to follow up with [facility] for routine outpatient treatment.  [name] nurse, [name], has been notified.  [name], [place] Triage Specialist [phone]

## 2018-02-19 NOTE — Consult Note (Addendum)
[facility] Face-to-Face Psychiatry Consult   Reason for Consult:  Alcohol and cocaine abuse with suicidal ideations Referring Physician:  EDP Patient Identification: [name] MRN:  [id number] Principal Diagnosis: Cocaine abuse with cocaine-induced mood disorder [facility]) Diagnosis:   Patient Active Problem List   Diagnosis Date Noted  . Cocaine abuse ([place]) [F14.10]     Priority: High  . Cocaine abuse with cocaine-induced mood disorder [facility]) [F14.14] 12/05/2016    Priority: High  . Alcohol use disorder, severe, dependence ([place]) [F10.20] 08/30/2015    Priority: High  . Alcohol abuse with alcohol-induced mood disorder ([place]) [F10.14] 08/30/2015    Priority: High  . Schizoaffective disorder, depressive type ([place]) [F25.1]     Priority: High  . Acute systolic heart failure ([place]) [I50.21] 02/02/2018  . Chest pain [R07.9] 01/31/2018  . Hypokalemia [E87.6]   . Schizoaffective disorder, bipolar type ([place]) [F25.0] 11/22/2017  . Trauma [T14.90XA]   . Tachycardia [R00.0] 03/03/2017  . Alcohol abuse [F10.10]   . Hypertensive urgency [I16.0]   . Tachycardia [R00.0]   . Cerebrovascular disease [I67.9] 09/02/2015  . 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 Malignant hypertension [I10] 08/30/2015  . Alcohol withdrawal ([place]) [F10.239] 08/30/2015  . Thrombocytopenia ([place]) [D69.6] 08/27/2015  . Tobacco use disorder [F17.200] 08/26/2015  . Hyperlipidemia [E78.5] 04/28/2015  . Gout [M10.9] 08/09/2013  . Diabetes mellitus ([place]) [E11.9] 07/09/2012    Total Time spent with patient: 45 minutes  Subjective:   [name] is a 57 y.o. male patient does not warrant admission.  HPI:  57 yo male who came to the ED for suicidal ideations after using cocaine and alcohol.  Today, he is clear and coherent with no suicidal/homicidal ideations, hallucinations, and withdrawal symptoms.  He is interested in getting help for his substance abuse, Peer  Support consult placed.  Educated him about his medications not being effective with the use of cocaine and alcohol.  Past Psychiatric History: substance abuse, schizoaffective disorder  Risk to Self:None Risk to Others: None Prior Inpatient Therapy: Prior Inpatient Therapy: Yes Prior Therapy Dates: 2019,2018, 2017 Prior Therapy Facilty/Provider(s): [facility] Prior Outpatient Therapy: Prior Outpatient Therapy: Yes Prior Therapy Dates: 2018 Prior Therapy Facilty/Provider(s): [name] Reason for Treatment: Medication management Does patient have an ACCT team?: Yes Does patient have Intensive In-House Services?  : No Does patient have [name] services? : Yes Does patient have P4CC services?: NoCurrent  Past Medical History:  Past Medical History:  Diagnosis Date  . Alcohol abuse   . Arthritis   . Crack cocaine use   . Depression   . Diabetes mellitus   . Drug abuse ([place])   . DTs (delirium tremens) ([place])    history of   . Gout   . Noncompliance with medication regimen   . Schizophrenia ([place])   . Stroke ([place])   . Systolic heart failure secondary to hypertension ([place])   . Uncontrolled hypertension     Past Surgical History:  Procedure Laterality Date  . LEFT HEART CATH AND CORONARY ANGIOGRAPHY N/A 02/03/2018   Procedure: LEFT HEART CATH AND CORONARY ANGIOGRAPHY;  Surgeon: Jettie Booze, MD;  Location: [facility];  Service: Cardiovascular;  Laterality: N/A;  . MANDIBLE RECONSTRUCTION    . TONSILLECTOMY     Family History:  Family History  Problem Relation Age of Onset  . Hypertension Unknown    Family Psychiatric  History: none  Social History:  Social History   Substance and Sexual Activity  Alcohol Use  Yes  . Alcohol/week: 0.6 - 1.2 oz  . Types: 1 - 2 Cans of beer per week   Comment: 2 cases of beer a day + liquour     Social History   Substance and Sexual Activity  Drug Use Yes  . Types: "Crack" cocaine, Cocaine    Social History    Socioeconomic History  . Marital status: Legally Separated    Spouse name: Not on file  . Number of children: Not on file  . Years of education: Not on file  . Highest education level: Not on file  Occupational History  . Not on file  Social Needs  . Financial resource strain: Not on file  . Food insecurity:    Worry: Not on file    Inability: Not on file  . Transportation needs:    Medical: Not on file    Non-medical: Not on file  Tobacco Use  . Smoking status: Current Every Day Smoker    Packs/day: 0.25    Types: Cigarettes  . Smokeless tobacco: Never Used  Substance and Sexual Activity  . Alcohol use: Yes    Alcohol/week: 0.6 - 1.2 oz    Types: 1 - 2 Cans of beer per week    Comment: 2 cases of beer a day + liquour  . Drug use: Yes    Types: "Crack" cocaine, Cocaine  . Sexual activity: Not on file  Lifestyle  . Physical activity:    Days per week: Not on file    Minutes per session: Not on file  . Stress: Not on file  Relationships  . Social connections:    Talks on phone: Not on file    Gets together: Not on file    Attends religious service: Not on file    Active member of club or organization: Not on file    Attends meetings of clubs or organizations: Not on file    Relationship status: Not on file  Other Topics Concern  . Not on file  Social History Narrative  . Not on file   Additional Social History: N/A    Allergies:  No Known Allergies  Labs:  Results for orders placed or performed during the hospital encounter of 02/17/18 (from the past 48 hour(s))  Comprehensive metabolic panel     Status: Abnormal   Collection Time: 02/17/18  2:02 PM  Result Value Ref Range   Sodium 140 135 - 145 mmol/L   Potassium 3.9 3.5 - 5.1 mmol/L   Chloride 104 101 - 111 mmol/L   CO2 26 22 - 32 mmol/L   Glucose, Bld 171 (H) 65 - 99 mg/dL   BUN 15 6 - 20 mg/dL   Creatinine, Ser 0.90 0.61 - 1.24 mg/dL   Calcium 9.0 8.9 - 10.3 mg/dL   Total Protein 8.0 6.5 - 8.1  g/dL   Albumin 3.6 3.5 - 5.0 g/dL   AST 64 (H) 15 - 41 U/L   ALT 32 17 - 63 U/L   Alkaline Phosphatase 73 38 - 126 U/L   Total Bilirubin 0.7 0.3 - 1.2 mg/dL   GFR calc non Af Amer >60 >60 mL/min   GFR calc Af Amer >60 >60 mL/min    Comment: (NOTE) The eGFR has been calculated using the CKD EPI equation. This calculation has not been validated in all clinical situations. eGFR's persistently <60 mL/min signify possible Chronic Kidney Disease.    Anion gap 10 5 - 15    Comment: Performed at  [facility], [place] [address], [place], [place] [address]  CBC with Diff     Status: None   Collection Time: 02/17/18  2:02 PM  Result Value Ref Range   WBC 6.5 4.0 - 10.5 K/uL   RBC 4.45 4.22 - 5.81 MIL/uL   Hemoglobin 13.4 13.0 - 17.0 g/dL   HCT 40.5 39.0 - 52.0 %   MCV 91.0 78.0 - 100.0 fL   MCH 30.1 26.0 - 34.0 pg   MCHC 33.1 30.0 - 36.0 g/dL   RDW 12.2 11.5 - 15.5 %   Platelets 176 150 - 400 K/uL   Neutrophils Relative % 51 %   Neutro Abs 3.4 1.7 - 7.7 K/uL   Lymphocytes Relative 40 %   Lymphs Abs 2.6 0.7 - 4.0 K/uL   Monocytes Relative 5 %   Monocytes Absolute 0.3 0.1 - 1.0 K/uL   Eosinophils Relative 3 %   Eosinophils Absolute 0.2 0.0 - 0.7 K/uL   Basophils Relative 1 %   Basophils Absolute 0.0 0.0 - 0.1 K/uL    Comment: Performed at [facility], [place] [address] [place], [place] [address]  Ethanol     Status: Abnormal   Collection Time: 02/17/18  2:02 PM  Result Value Ref Range   Alcohol, Ethyl (B) 114 (H) <10 mg/dL    Comment:        LOWEST DETECTABLE LIMIT FOR SERUM ALCOHOL IS 10 mg/dL FOR MEDICAL PURPOSES ONLY Performed at [facility], [place] [address] [place], [place] [address]   Acetaminophen level     Status: Abnormal   Collection Time: 02/17/18  2:02 PM  Result Value Ref Range   Acetaminophen (Tylenol), Serum <10 (L) 10 - 30 ug/mL    Comment:        THERAPEUTIC CONCENTRATIONS VARY SIGNIFICANTLY. A RANGE OF  10-30 ug/mL MAY BE AN EFFECTIVE CONCENTRATION FOR MANY PATIENTS. HOWEVER, SOME ARE BEST TREATED AT CONCENTRATIONS OUTSIDE THIS RANGE. ACETAMINOPHEN CONCENTRATIONS >150 ug/mL AT 4 HOURS AFTER INGESTION AND >50 ug/mL AT 12 HOURS AFTER INGESTION ARE OFTEN ASSOCIATED WITH TOXIC REACTIONS. Performed at [facility], [place] [address] [place], [place] [address]   Salicylate level     Status: None   Collection Time: 02/17/18  2:02 PM  Result Value Ref Range   Salicylate Lvl <0.0 2.8 - 30.0 mg/dL    Comment: Performed at [facility], [place] [address] [place], [place] [address]  Urine rapid drug screen (hosp performed)     Status: Abnormal   Collection Time: 02/17/18  2:30 PM  Result Value Ref Range   Opiates NONE DETECTED NONE DETECTED   Cocaine POSITIVE (A) NONE DETECTED   Benzodiazepines POSITIVE (A) NONE DETECTED   Amphetamines NONE DETECTED NONE DETECTED   Tetrahydrocannabinol NONE DETECTED NONE DETECTED   Barbiturates NONE DETECTED NONE DETECTED    Comment: (NOTE) DRUG SCREEN FOR MEDICAL PURPOSES ONLY.  IF CONFIRMATION IS NEEDED FOR ANY PURPOSE, NOTIFY LAB WITHIN 5 DAYS. LOWEST DETECTABLE LIMITS FOR URINE DRUG SCREEN Drug Class                     Cutoff (ng/mL) Amphetamine and metabolites    1000 Barbiturate and metabolites    200 Benzodiazepine                 950 Tricyclics and metabolites     300 Opiates and metabolites        300 Cocaine and  metabolites        300 THC                            50 Performed at [facility], [place] [address], [place], [place] [address]   POC CBG, ED     Status: Abnormal   Collection Time: 02/19/18  9:05 AM  Result Value Ref Range   Glucose-Capillary 299 (H) 65 - 99 mg/dL    Current Facility-Administered Medications  Medication Dose Route Frequency Provider Last Rate Last Dose  . amLODipine (NORVASC) tablet 10 mg  10 mg Oral Daily Khatri, Hina, PA-C   10 mg at 02/19/18 0915  .  DULoxetine (CYMBALTA) DR capsule 30 mg  30 mg Oral Daily Ethelene Hal, NP   30 mg at 02/19/18 0915  . gabapentin (NEURONTIN) capsule 300 mg  300 mg Oral TID Ethelene Hal, NP   300 mg at 02/19/18 0916  . hydrochlorothiazide (MICROZIDE) capsule 12.5 mg  12.5 mg Oral Daily Khatri, Hina, PA-C   12.5 mg at 02/19/18 0915  . lisinopril (PRINIVIL,ZESTRIL) tablet 10 mg  10 mg Oral Daily Khatri, Hina, PA-C   10 mg at 02/19/18 0915  . LORazepam (ATIVAN) injection 0-4 mg  0-4 mg Intravenous Q6H Khatri, Hina, PA-C       Or  . LORazepam (ATIVAN) tablet 0-4 mg  0-4 mg Oral Q6H Khatri, Hina, PA-C   1 mg at 02/19/18 0939  . LORazepam (ATIVAN) injection 0-4 mg  0-4 mg Intravenous Q12H Khatri, Hina, PA-C       Or  . LORazepam (ATIVAN) tablet 0-4 mg  0-4 mg Oral Q12H Khatri, Hina, PA-C      . metFORMIN (GLUCOPHAGE) tablet 500 mg  500 mg Oral BID WC Ethelene Hal, NP   500 mg at 02/19/18 0914  . paliperidone (INVEGA) 24 hr tablet 3 mg  3 mg Oral Daily Ethelene Hal, NP   3 mg at 02/19/18 0916  . thiamine (VITAMIN B-1) tablet 100 mg  100 mg Oral Daily Khatri, Hina, PA-C   100 mg at 02/19/18 6759   Or  . thiamine (B-1) injection 100 mg  100 mg Intravenous Daily Khatri, Hina, PA-C       Current Outpatient Medications  Medication Sig Dispense Refill  . amLODipine (NORVASC) 10 MG tablet Take 1 tablet (10 mg total) by mouth daily. (Patient not taking: Reported on 02/17/2018) 30 tablet 0  . DULoxetine (CYMBALTA) 30 MG capsule Take 1 capsule (30 mg total) by mouth 2 (two) times daily. (Patient not taking: Reported on 02/17/2018) 60 capsule 0  . gabapentin (NEURONTIN) 300 MG capsule Take 1 capsule (300 mg total) by mouth 3 (three) times daily. (Patient not taking: Reported on 02/17/2018) 90 capsule 0  . hydrochlorothiazide (MICROZIDE) 12.5 MG capsule Take 1 capsule (12.5 mg total) by mouth daily. (Patient not taking: Reported on 02/17/2018) 30 capsule 0  . lisinopril (PRINIVIL,ZESTRIL) 10 MG tablet  Take 1 tablet (10 mg total) by mouth daily. (Patient not taking: Reported on 02/17/2018) 30 tablet 0  . metFORMIN (GLUCOPHAGE) 500 MG tablet Take 1 tablet (500 mg total) by mouth 2 (two) times daily with a meal. (Patient not taking: Reported on 02/17/2018) 60 tablet 0  . paliperidone (INVEGA) 3 MG 24 hr tablet Take 1 tablet (3 mg total) by mouth daily. (Patient not taking: Reported on 02/17/2018) 30 tablet 0    Musculoskeletal: Strength & Muscle  Tone: within normal limits Gait & Station: normal Patient leans: N/A  Psychiatric Specialty Exam: Physical Exam  Nursing note and vitals reviewed. Constitutional: He is oriented to person, place, and time. He appears well-developed and well-nourished.  HENT:  Head: Normocephalic and atraumatic.  Neck: Normal range of motion.  Respiratory: Effort normal.  Musculoskeletal: Normal range of motion.  Neurological: He is alert and oriented to person, place, and time.  Psychiatric: He has a normal mood and affect. His speech is normal and behavior is normal. Judgment and thought content normal. Cognition and memory are normal.    Review of Systems  Psychiatric/Behavioral: Positive for substance abuse.  All other systems reviewed and are negative.   Blood pressure (!) 161/117, pulse 92, temperature 98.2 F (36.8 C), temperature source Oral, resp. rate 16, height _0  (1.803 m), weight 86.2 kg (190 lb), SpO2 96 %.Body mass index is 26.5 kg/m.  General Appearance: Casual  Eye Contact:  Good  Speech:  Normal Rate  Volume:  Normal  Mood:  Euthymic  Affect:  Congruent  Thought Process:  Coherent and Descriptions of Associations: Intact  Orientation:  Full (Time, Place, and Person)  Thought Content:  WDL and Logical  Suicidal Thoughts:  No  Homicidal Thoughts:  No  Memory:  Immediate;   Good Recent;   Good Remote;   Good  Judgement:  Fair  Insight:  Fair  Psychomotor Activity:  Normal  Concentration:  Concentration: Good and Attention Span: Good   Recall:  Good  Fund of Knowledge:  Fair  Language:  Good  Akathisia:  No  Handed:  Right  AIMS (if indicated):   N/A  Assets:  Leisure Time Physical Health Resilience Social Support  ADL's:  Intact  Cognition:  WNL  Sleep:   N/A     Treatment Plan Summary: Daily contact with patient to assess and evaluate symptoms and progress in treatment, Medication management and Plan alcohol abuse with alcohol induced mood disorder:  -Crisis stabilization -Medication management:  Medical medications continued along with Invega 3 mg daily for schizoaffective disorder, Cymbalta 30 mg daily for depression, and Gabapentin 300 mg TID for substance abuse -Individual counseling  Disposition: No evidence of imminent risk to self or others at present.    Waylan Boga, NP 02/19/2018 11:19 AM    Patient seen face-to-face for psychiatric evaluation, chart reviewed and case discussed with the physician extender and developed treatment plan. Reviewed the information documented and agree with the treatment plan.  Buford Dresser, DO 02/19/18 7:15 PM   .

## 2018-02-19 NOTE — Progress Notes (Signed)
Inpatient Diabetes Program Recommendations  AACE/ADA: New Consensus Statement on Inpatient Glycemic Control (2015)  Target Ranges:  Prepandial:   less than 140 mg/dL      Peak postprandial:   less than 180 mg/dL (1-2 hours)      Critically ill patients:  140 - 180 mg/dL   Lab Results  Component Value Date   GLUCAP 299 (H) 02/19/2018   HGBA1C 8.6 (H) 11/24/2017    Review of Glycemic Control  Diabetes history: DM2 Outpatient Diabetes medications: metformin 500 mg bid (pt reports not taking) Current orders for Inpatient glycemic control: metformin 500 mg bid   Inpatient Diabetes Program Recommendations:     Add Novolog 0-9 units tidwc and hs Need updated HgbA1C to assess glycemic control  Continue to follow if admitted.  Thank you. Lorenda Peck, RD, LDN, CDE Inpatient Diabetes Coordinator [phone]

## 2018-02-19 NOTE — Discharge Instructions (Signed)
For your mental health needs, you are advised to follow up with [name].  New and returning patients are seen at their walk-in clinic.  Walk-in hours are Monday - Friday from 8:00 am - 3:00 pm.  Walk-in patients are seen on a first come, first served basis.  Try to arrive as early as possible for he best chance of being seen the same day: ° °     [name] °     [address] °     [place], [place] [address] °     [phone] °

## 2018-10-04 IMAGING — CR DG CHEST 2V
2 series · 2 of 2 positions shown · non-contrast
Comparison: 11/26/2017

CLINICAL DATA: Cough for several months. Smoker. Alcohol use
disorder.

EXAM:
CHEST  2 VIEW

[w chest pa]
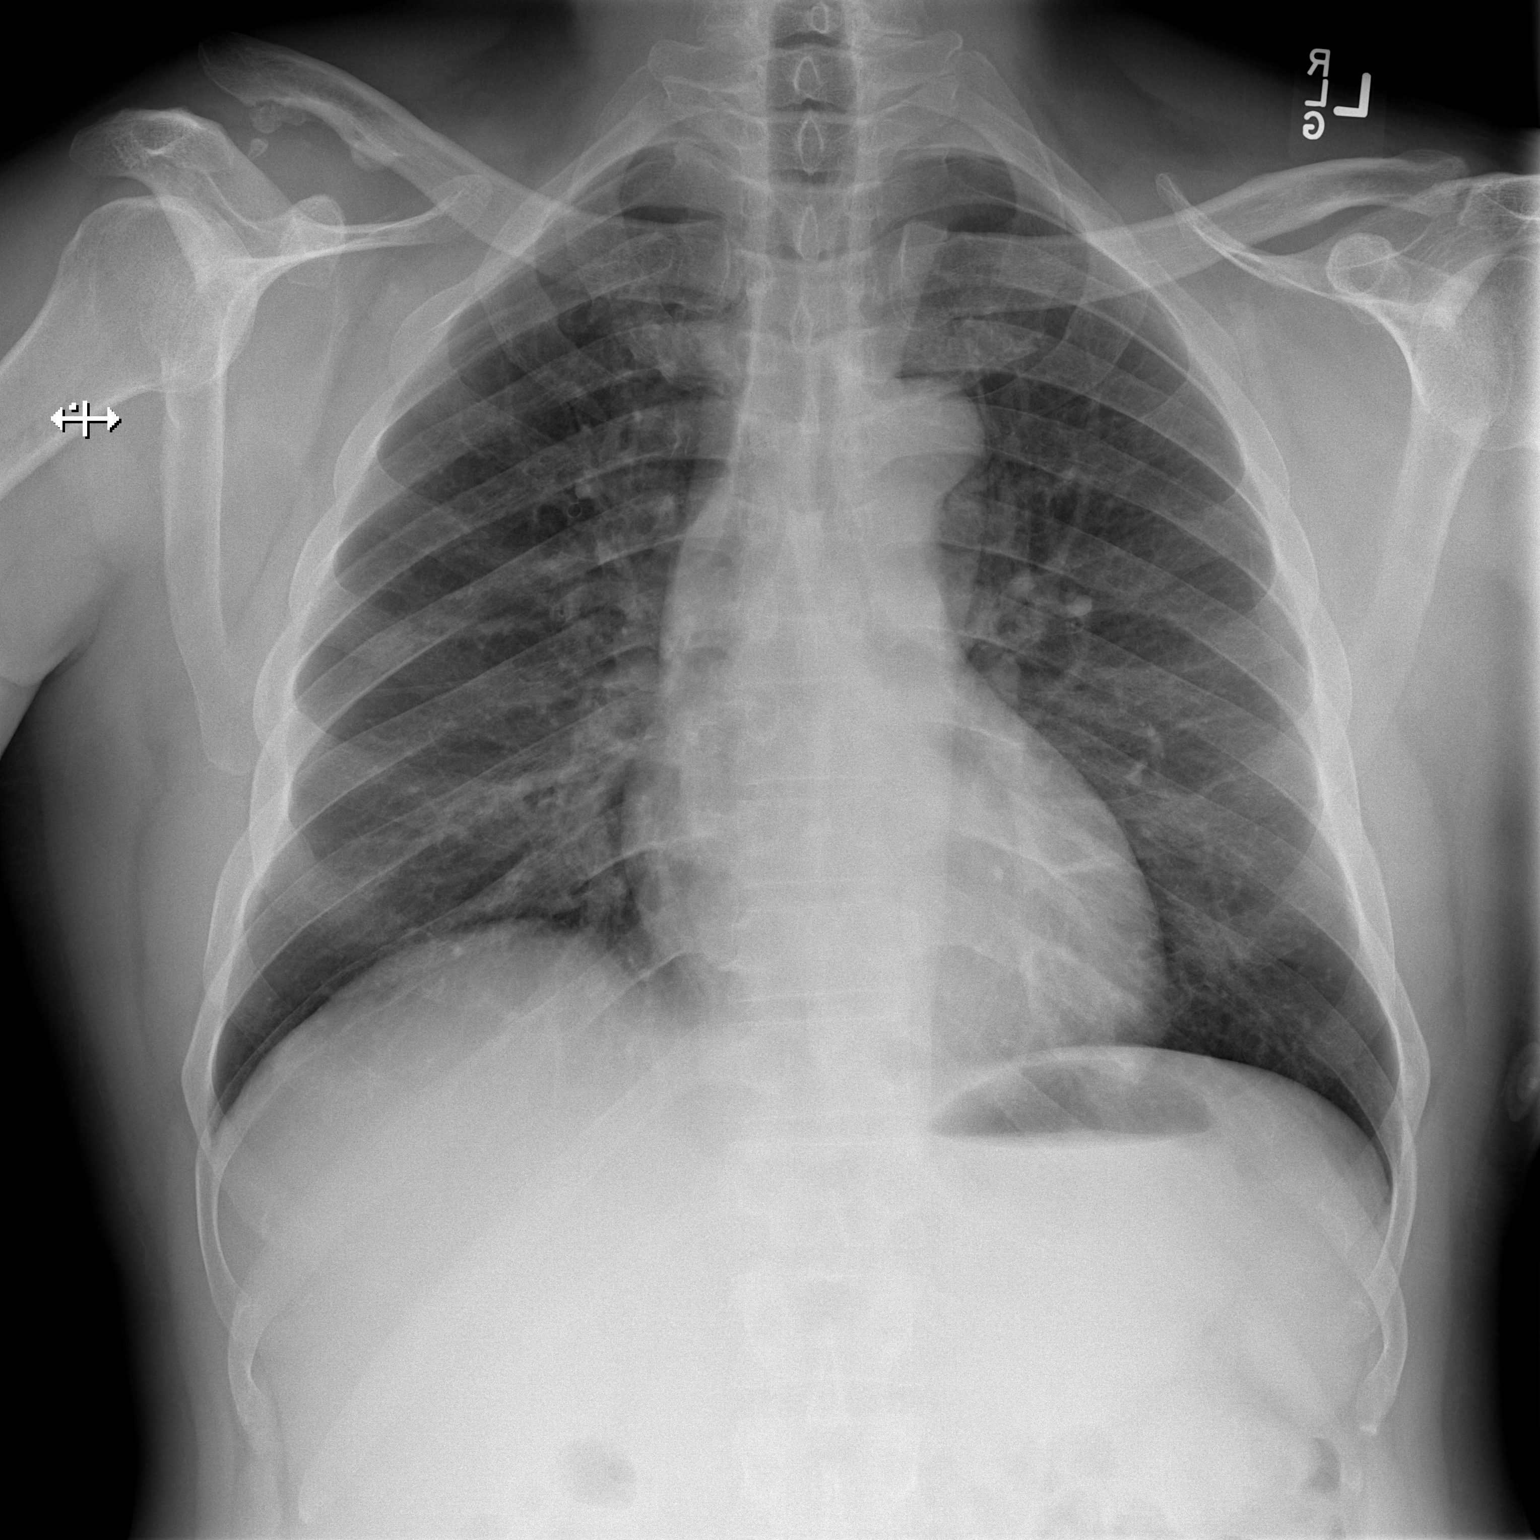

[w chest lat]
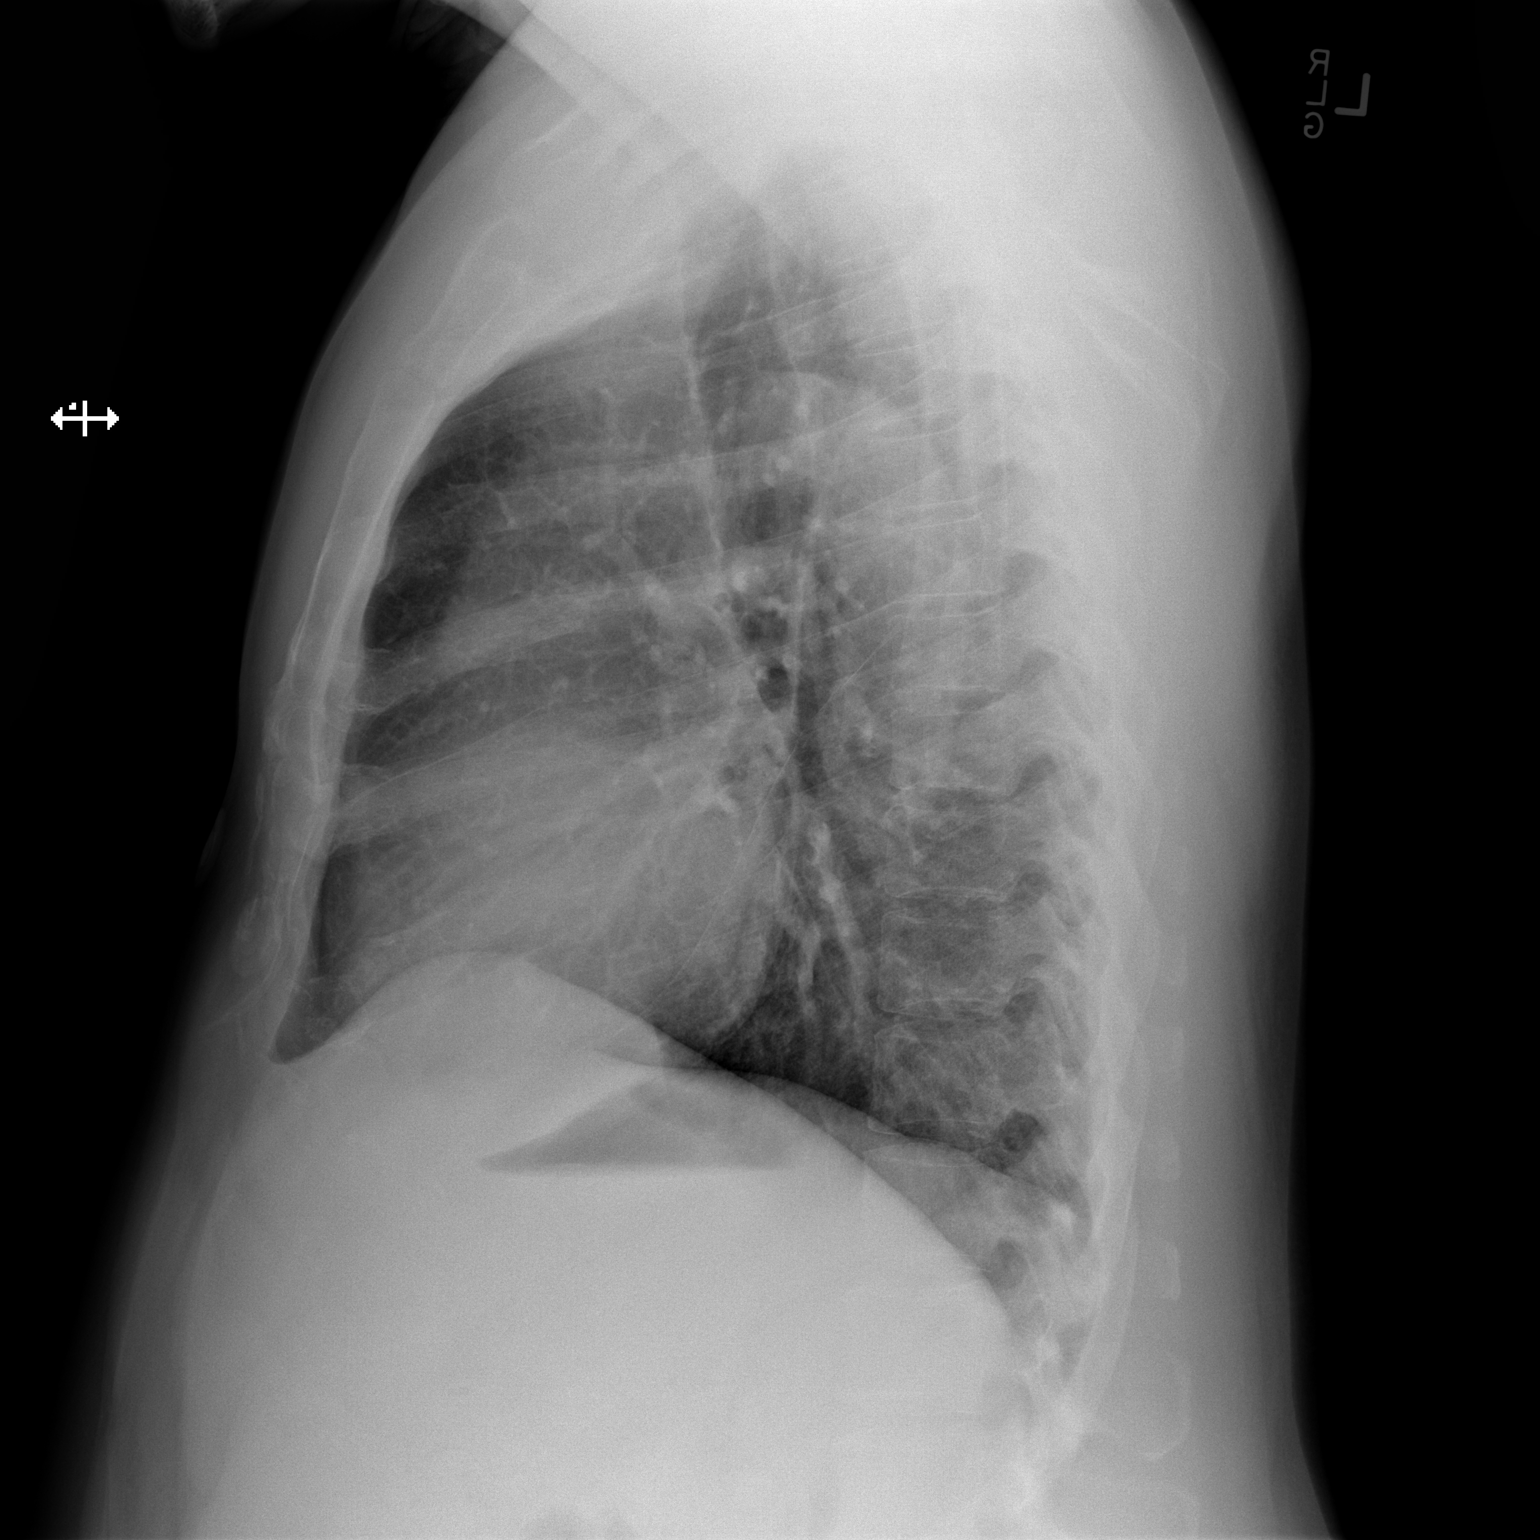

[2 of 2 positions shown; findings below may reference images not displayed]

FINDINGS: The heart size and mediastinal contours are within normal limits.
Both lungs are clear. The visualized skeletal structures are
unremarkable.
IMPRESSION: No active cardiopulmonary disease.

## 2020-02-06 ENCOUNTER — Inpatient Hospital Stay (HOSPITAL_COMMUNITY)
Admission: EM | Admit: 2020-02-06 | Discharge: 2020-02-10 | DRG: 305 | Disposition: A | Discharge status: Home Health | Payer: Medicaid Other | Attending: Internal Medicine | Admitting: Internal Medicine

## 2020-02-06 ENCOUNTER — Encounter (HOSPITAL_COMMUNITY): Payer: Self-pay | Admitting: Emergency Medicine

## 2020-02-06 ENCOUNTER — Emergency Department (HOSPITAL_COMMUNITY): Payer: Medicaid Other

## 2020-02-06 ENCOUNTER — Other Ambulatory Visit: Payer: Self-pay

## 2020-02-06 DIAGNOSIS — I160 Hypertensive urgency: Principal | ICD-10-CM | POA: Diagnosis present

## 2020-02-06 DIAGNOSIS — E1165 Type 2 diabetes mellitus with hyperglycemia: Secondary | ICD-10-CM | POA: Diagnosis present

## 2020-02-06 DIAGNOSIS — M25552 Pain in left hip: Secondary | ICD-10-CM

## 2020-02-06 DIAGNOSIS — I679 Cerebrovascular disease, unspecified: Secondary | ICD-10-CM | POA: Diagnosis present

## 2020-02-06 DIAGNOSIS — R0602 Shortness of breath: Secondary | ICD-10-CM

## 2020-02-06 DIAGNOSIS — Z20822 Contact with and (suspected) exposure to covid-19: Secondary | ICD-10-CM | POA: Diagnosis present

## 2020-02-06 DIAGNOSIS — Z7984 Long term (current) use of oral hypoglycemic drugs: Secondary | ICD-10-CM

## 2020-02-06 DIAGNOSIS — I110 Hypertensive heart disease with heart failure: Secondary | ICD-10-CM | POA: Diagnosis present

## 2020-02-06 DIAGNOSIS — E785 Hyperlipidemia, unspecified: Secondary | ICD-10-CM | POA: Diagnosis present

## 2020-02-06 DIAGNOSIS — R9431 Abnormal electrocardiogram [ECG] [EKG]: Secondary | ICD-10-CM | POA: Diagnosis present

## 2020-02-06 DIAGNOSIS — F251 Schizoaffective disorder, depressive type: Secondary | ICD-10-CM | POA: Diagnosis present

## 2020-02-06 DIAGNOSIS — Z8249 Family history of ischemic heart disease and other diseases of the circulatory system: Secondary | ICD-10-CM

## 2020-02-06 DIAGNOSIS — M109 Gout, unspecified: Secondary | ICD-10-CM | POA: Diagnosis present

## 2020-02-06 DIAGNOSIS — E119 Type 2 diabetes mellitus without complications: Secondary | ICD-10-CM

## 2020-02-06 DIAGNOSIS — M1990 Unspecified osteoarthritis, unspecified site: Secondary | ICD-10-CM | POA: Diagnosis present

## 2020-02-06 DIAGNOSIS — F250 Schizoaffective disorder, bipolar type: Secondary | ICD-10-CM | POA: Diagnosis present

## 2020-02-06 DIAGNOSIS — I5020 Unspecified systolic (congestive) heart failure: Secondary | ICD-10-CM | POA: Diagnosis present

## 2020-02-06 DIAGNOSIS — I5022 Chronic systolic (congestive) heart failure: Secondary | ICD-10-CM | POA: Diagnosis present

## 2020-02-06 DIAGNOSIS — I5021 Acute systolic (congestive) heart failure: Secondary | ICD-10-CM

## 2020-02-06 DIAGNOSIS — I69322 Dysarthria following cerebral infarction: Secondary | ICD-10-CM

## 2020-02-06 DIAGNOSIS — Z9119 Patient's noncompliance with other medical treatment and regimen: Secondary | ICD-10-CM

## 2020-02-06 DIAGNOSIS — N179 Acute kidney failure, unspecified: Secondary | ICD-10-CM | POA: Diagnosis present

## 2020-02-06 LAB — URINALYSIS, ROUTINE W REFLEX MICROSCOPIC
Bacteria, UA: NONE SEEN
Bilirubin Urine: NEGATIVE
Glucose, UA: >=500 mg/dL — AB
Hgb urine dipstick: NEGATIVE
Ketones, ur: NEGATIVE mg/dL
Leukocytes,Ua: NEGATIVE
Nitrite: NEGATIVE
Protein, ur: >=300 mg/dL — AB
Specific Gravity, Urine: 1.013 (ref 1.005–1.030)
pH: 5 (ref 5.0–8.0)

## 2020-02-06 LAB — COMPREHENSIVE METABOLIC PANEL
ALT: 10 U/L (ref 0–44)
AST: 19 U/L (ref 15–41)
Albumin: 3.7 g/dL (ref 3.5–5.0)
Alkaline Phosphatase: 90 U/L (ref 38–126)
Anion gap: 14 (ref 5–15)
BUN: 19 mg/dL (ref 6–20)
CO2: 23 mmol/L (ref 22–32)
Calcium: 9.3 mg/dL (ref 8.9–10.3)
Chloride: 97 mmol/L — ABNORMAL LOW (ref 98–111)
Creatinine, Ser: 1.23 mg/dL (ref 0.61–1.24)
GFR calc Af Amer: >60 mL/min (ref >60)
GFR calc non Af Amer: >60 mL/min (ref >60)
Glucose, Bld: 320 mg/dL — ABNORMAL HIGH (ref 70–99)
Potassium: 3.9 mmol/L (ref 3.5–5.1)
Sodium: 134 mmol/L — ABNORMAL LOW (ref 135–145)
Total Bilirubin: 0.8 mg/dL (ref 0.3–1.2)
Total Protein: 7.3 g/dL (ref 6.5–8.1)

## 2020-02-06 LAB — CBC WITH DIFFERENTIAL/PLATELET
Abs Immature Granulocytes: 0.03 10*3/uL (ref 0.00–0.07)
Basophils Absolute: 0.1 10*3/uL (ref 0.0–0.1)
Basophils Relative: 1 %
Eosinophils Absolute: 0.3 10*3/uL (ref 0.0–0.5)
Eosinophils Relative: 3 %
HCT: 41.1 % (ref 39.0–52.0)
Hemoglobin: 13.1 g/dL (ref 13.0–17.0)
Immature Granulocytes: 0 %
Lymphocytes Relative: 29 %
Lymphs Abs: 2.6 10*3/uL (ref 0.7–4.0)
MCH: 27.3 pg (ref 26.0–34.0)
MCHC: 31.9 g/dL (ref 30.0–36.0)
MCV: 85.8 fL (ref 80.0–100.0)
Monocytes Absolute: 1 10*3/uL (ref 0.1–1.0)
Monocytes Relative: 11 %
Neutro Abs: 4.9 10*3/uL (ref 1.7–7.7)
Neutrophils Relative %: 56 %
Platelets: 242 10*3/uL (ref 150–400)
RBC: 4.79 MIL/uL (ref 4.22–5.81)
RDW: 13.8 % (ref 11.5–15.5)
WBC: 8.8 10*3/uL (ref 4.0–10.5)
nRBC: 0 % (ref 0.0–0.2)

## 2020-02-06 MED ORDER — TRAMADOL HCL 50 MG PO TABS
50.0000 mg | ORAL_TABLET | Freq: Once | ORAL | Status: AC
  Administered 2020-02-06: 50 mg via ORAL
  Filled 2020-02-06: qty 1

## 2020-02-06 MED ORDER — METHOCARBAMOL 500 MG PO TABS
500.0000 mg | ORAL_TABLET | Freq: Once | ORAL | Status: AC
  Administered 2020-02-06: 500 mg via ORAL
  Filled 2020-02-06: qty 1

## 2020-02-06 MED ORDER — HYDRALAZINE HCL 25 MG PO TABS
25.0000 mg | ORAL_TABLET | Freq: Once | ORAL | Status: AC
  Administered 2020-02-06: 25 mg via ORAL
  Filled 2020-02-06: qty 1

## 2020-02-06 NOTE — ED Notes (Signed)
Pt refused gown.  

## 2020-02-06 NOTE — ED Triage Notes (Signed)
Patient reports chronic left lower back pain radiating to left leg for 2 months , denies recent injury pain increases with movement , no hematuria or fever . Hypertensive at triage .

## 2020-02-06 NOTE — ED Notes (Signed)
Pt to xr 

## 2020-02-06 NOTE — ED Provider Notes (Signed)
[facility] EMERGENCY DEPARTMENT Provider Note   CSN: [id number] Arrival date & time: 02/06/20  1947   History Chief Complaint  Patient presents with  . Back Pain    Hypertensive BP= 205/132    [name] is a 59 y.o. male.  The history is provided by the patient.  Back Pain He has history of hypertension, diabetes, hyperlipidemia, stroke, systolic heart failure, schizophrenia and comes in because of pain in his left hip.  He has been having left hip pain for several years and it is getting worse.  It is now to the point where he is having difficulty walking.  He rates pain at 10/10, but states he has not taken anything for it because ibuprofen and acetaminophen do not help him.  He denies any recent trauma.  He also states he knows his blood pressure has been high.  He claims compliance with all of his antihypertensive medications.  He denies headache, tinnitus, nosebleeds.  Past Medical History:  Diagnosis Date  . Alcohol abuse   . Arthritis   . Crack cocaine use   . Depression   . Diabetes mellitus   . Drug abuse ([place])   . DTs (delirium tremens) ([place])    history of   . Gout   . Noncompliance with medication regimen   . Schizophrenia ([place])   . Stroke ([place])   . Systolic heart failure secondary to hypertension ([place])   . Uncontrolled hypertension     Patient Active Problem List   Diagnosis Date Noted  . Acute systolic heart failure ([place]) 02/02/2018  . Chest pain 01/31/2018  . Hypokalemia   . Schizoaffective disorder, bipolar type ([place]) 11/22/2017  . Trauma   . Tachycardia 03/03/2017  . Alcohol abuse   . Cocaine abuse ([employer])   . Hypertensive urgency   . Tachycardia   . Cocaine abuse with cocaine-induced mood disorder ([place]) 12/05/2016  . Cerebrovascular disease 09/02/2015  . 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 Malignant hypertension 08/30/2015  . Alcohol use disorder, severe, dependence  ([place]) 08/30/2015  . Alcohol withdrawal ([place]) 08/30/2015  . Alcohol abuse with alcohol-induced mood disorder ([place]) 08/30/2015  . Thrombocytopenia ([place]) 08/27/2015  . Tobacco use disorder 08/26/2015  . Hyperlipidemia 04/28/2015  . Schizoaffective disorder, depressive type ([place])   . Gout 08/09/2013  . Diabetes mellitus ([place]) 07/09/2012    Past Surgical History:  Procedure Laterality Date  . LEFT HEART CATH AND CORONARY ANGIOGRAPHY N/A 02/03/2018   Procedure: LEFT HEART CATH AND CORONARY ANGIOGRAPHY;  Surgeon: Jettie Booze, MD;  Location: [facility];  Service: Cardiovascular;  Laterality: N/A;  . MANDIBLE RECONSTRUCTION    . TONSILLECTOMY         Family History  Problem Relation Age of Onset  . Hypertension Other     Social History   Tobacco Use  . Smoking status: Current Every Day Smoker    Packs/day: 0.25    Types: Cigarettes  . Smokeless tobacco: Never Used  Substance Use Topics  . Alcohol use: Yes    Alcohol/week: 1.0 - 2.0 standard drinks    Types: 1 - 2 Cans of beer per week    Comment: 2 cases of beer a day + liquour  . Drug use: Yes    Types: "Crack" cocaine, Cocaine    Home Medications Prior to Admission medications   Medication Sig Start Date End Date Taking? Authorizing Provider  DULoxetine (CYMBALTA) 30 MG capsule Take 1 capsule (30  mg total) by mouth 2 (two) times daily. Patient not taking: Reported on 02/17/2018 02/15/18   Patrecia Pour, NP  gabapentin (NEURONTIN) 300 MG capsule Take 1 capsule (300 mg total) by mouth 3 (three) times daily. 02/19/18   Patrecia Pour, NP  metFORMIN (GLUCOPHAGE) 500 MG tablet Take 1 tablet (500 mg total) by mouth 2 (two) times daily with a meal. Patient not taking: Reported on 02/17/2018 02/15/18   Patrecia Pour, NP  paliperidone (INVEGA) 3 MG 24 hr tablet Take 1 tablet (3 mg total) by mouth daily. Patient not taking: Reported on 02/17/2018 02/15/18   Patrecia Pour, NP    Allergies    Patient has no known  allergies.  Review of Systems   Review of Systems  Musculoskeletal: Positive for back pain.  All other systems reviewed and are negative.   Physical Exam Updated Vital Signs BP (!) 207/148 (BP Location: Left Arm)   Pulse (!) 118   Temp 97.9 F (36.6 C) (Oral)   Resp 20   Ht 5\' 11"  (1.803 m)   Wt 95.3 kg   SpO2 100%   BMI 29.29 kg/m   Physical Exam Vitals and nursing note reviewed.   59 year old male, resting comfortably and in no acute distress. Vital signs are significant for elevated blood pressure and heart rate. Oxygen saturation is 100%, which is normal. Head is normocephalic and atraumatic. PERRLA, EOMI. Oropharynx is clear.  Fundi show no hemorrhage, exudate, papilledema. Neck is nontender and supple without adenopathy or JVD. Back is mildly tender in the left paralumbar area.  There is no CVA tenderness. Lungs are clear without rales, wheezes, or rhonchi. Chest is nontender. Heart has regular rate and rhythm without murmur. Abdomen is soft, flat, nontender without masses or hepatosplenomegaly and peristalsis is normoactive. Extremities have trace edema.  There is mild tenderness to palpation over the anterior aspect of the left hip.  There is pain on passive range of motion of the left hip.  Ingrown toenail right great toe. Skin is warm and dry without rash. Neurologic: Mental status is normal, cranial nerves are intact, there are no motor or sensory deficits.  ED Results / Procedures / Treatments   Labs (all labs ordered are listed, but only abnormal results are displayed) Labs Reviewed  COMPREHENSIVE METABOLIC PANEL - Abnormal; Notable for the following components:      Result Value   Sodium 134 (*)    Chloride 97 (*)    Glucose, Bld 320 (*)    All other components within normal limits  CBC WITH DIFFERENTIAL/PLATELET  URINALYSIS, ROUTINE W REFLEX MICROSCOPIC   Radiology No results found.  Procedures Procedures   Medications Ordered in ED Medications -  No data to display  ED Course  I have reviewed the triage vital signs and the nursing notes.  Pertinent labs & imaging results that were available during my care of the patient were reviewed by me and considered in my medical decision making (see chart for details).  MDM Rules/Calculators/A&P Left hip pain in a pattern that seems more typical of osteoarthritis than sciatica.  However, there is some mild lumbar tenderness and there may be some concomitant component of radicular pain.  Elevated blood pressure.  Old records are reviewed, and he had been seen at [facility] ED on 01/26/2020 for left hip pain, also ED visit on 01/28/2020 for elevated blood pressure.  On reviewing his records, it appears that he is on labetalol  and hydralazine for his blood pressure, but hydralazine is a relatively low dose of 10 mg twice a day.  He will be given a dose of hydralazine 25 mg and will be sent for x-rays of his left hip to look for arthritic changes.  Labs were obtained which showed normal renal function, moderate hyperglycemia.  Urinalysis does have significant proteinuria which is greater than it had been on prior urine specimens.  He will be given a dose of tramadol for pain, and a dose of methocarbamol.  Hip x-ray does not show evidence of arthritis.  He had no relief of pain with above-noted treatment, and blood pressure has not come down.  He is given a dose of ibuprofen, oxycodone-acetaminophen, clonidine.  Following above-noted treatment, pain has improved significantly, but blood pressure is still elevated and not significantly changed.  We will give additional clonidine.  Blood pressure has not improved in spite of total of 0.3 mg clonidine and 25 mg hydralazine.  Will plan to admit for blood pressure control.  Case is discussed with Dr. Hal Hope of [facility], who agrees to admit the patient.  Final Clinical Impression(s) / ED Diagnoses Final diagnoses:    Hypertensive urgency  Left hip pain    Rx / DC Orders ED Discharge Orders    None       Delora Fuel, MD 81/27/51 [phone]

## 2020-02-07 ENCOUNTER — Encounter (HOSPITAL_COMMUNITY): Payer: Self-pay | Admitting: Internal Medicine

## 2020-02-07 ENCOUNTER — Observation Stay (HOSPITAL_COMMUNITY): Payer: Medicaid Other

## 2020-02-07 DIAGNOSIS — Z8249 Family history of ischemic heart disease and other diseases of the circulatory system: Secondary | ICD-10-CM | POA: Diagnosis not present

## 2020-02-07 DIAGNOSIS — E0822 Diabetes mellitus due to underlying condition with diabetic chronic kidney disease: Secondary | ICD-10-CM | POA: Diagnosis not present

## 2020-02-07 DIAGNOSIS — F209 Schizophrenia, unspecified: Secondary | ICD-10-CM | POA: Insufficient documentation

## 2020-02-07 DIAGNOSIS — F251 Schizoaffective disorder, depressive type: Secondary | ICD-10-CM | POA: Diagnosis present

## 2020-02-07 DIAGNOSIS — I5020 Unspecified systolic (congestive) heart failure: Secondary | ICD-10-CM | POA: Diagnosis present

## 2020-02-07 DIAGNOSIS — I69322 Dysarthria following cerebral infarction: Secondary | ICD-10-CM | POA: Diagnosis not present

## 2020-02-07 DIAGNOSIS — I110 Hypertensive heart disease with heart failure: Secondary | ICD-10-CM | POA: Diagnosis present

## 2020-02-07 DIAGNOSIS — E1165 Type 2 diabetes mellitus with hyperglycemia: Secondary | ICD-10-CM | POA: Diagnosis present

## 2020-02-07 DIAGNOSIS — I160 Hypertensive urgency: Principal | ICD-10-CM

## 2020-02-07 DIAGNOSIS — R9431 Abnormal electrocardiogram [ECG] [EKG]: Secondary | ICD-10-CM | POA: Diagnosis not present

## 2020-02-07 DIAGNOSIS — Z794 Long term (current) use of insulin: Secondary | ICD-10-CM | POA: Diagnosis not present

## 2020-02-07 DIAGNOSIS — M25552 Pain in left hip: Secondary | ICD-10-CM

## 2020-02-07 DIAGNOSIS — Z9119 Patient's noncompliance with other medical treatment and regimen: Secondary | ICD-10-CM | POA: Diagnosis not present

## 2020-02-07 DIAGNOSIS — I679 Cerebrovascular disease, unspecified: Secondary | ICD-10-CM | POA: Diagnosis not present

## 2020-02-07 DIAGNOSIS — Z20822 Contact with and (suspected) exposure to covid-19: Secondary | ICD-10-CM | POA: Diagnosis present

## 2020-02-07 DIAGNOSIS — N179 Acute kidney failure, unspecified: Secondary | ICD-10-CM | POA: Diagnosis present

## 2020-02-07 DIAGNOSIS — N185 Chronic kidney disease, stage 5: Secondary | ICD-10-CM | POA: Diagnosis not present

## 2020-02-07 DIAGNOSIS — R0602 Shortness of breath: Secondary | ICD-10-CM | POA: Diagnosis not present

## 2020-02-07 DIAGNOSIS — E785 Hyperlipidemia, unspecified: Secondary | ICD-10-CM | POA: Diagnosis present

## 2020-02-07 DIAGNOSIS — I5022 Chronic systolic (congestive) heart failure: Secondary | ICD-10-CM | POA: Diagnosis present

## 2020-02-07 DIAGNOSIS — M1990 Unspecified osteoarthritis, unspecified site: Secondary | ICD-10-CM | POA: Diagnosis present

## 2020-02-07 DIAGNOSIS — Z7984 Long term (current) use of oral hypoglycemic drugs: Secondary | ICD-10-CM | POA: Diagnosis not present

## 2020-02-07 DIAGNOSIS — F250 Schizoaffective disorder, bipolar type: Secondary | ICD-10-CM | POA: Diagnosis present

## 2020-02-07 DIAGNOSIS — E119 Type 2 diabetes mellitus without complications: Secondary | ICD-10-CM

## 2020-02-07 DIAGNOSIS — M109 Gout, unspecified: Secondary | ICD-10-CM | POA: Diagnosis present

## 2020-02-07 LAB — RAPID URINE DRUG SCREEN, HOSP PERFORMED
Amphetamines: NOT DETECTED
Barbiturates: NOT DETECTED
Benzodiazepines: NOT DETECTED
Cocaine: NOT DETECTED
Opiates: NOT DETECTED
Tetrahydrocannabinol: NOT DETECTED

## 2020-02-07 LAB — CBC
HCT: 37.5 % — ABNORMAL LOW (ref 39.0–52.0)
Hemoglobin: 11.7 g/dL — ABNORMAL LOW (ref 13.0–17.0)
MCH: 27 pg (ref 26.0–34.0)
MCHC: 31.2 g/dL (ref 30.0–36.0)
MCV: 86.6 fL (ref 80.0–100.0)
Platelets: 231 10*3/uL (ref 150–400)
RBC: 4.33 MIL/uL (ref 4.22–5.81)
RDW: 13.9 % (ref 11.5–15.5)
WBC: 7.5 10*3/uL (ref 4.0–10.5)
nRBC: 0 % (ref 0.0–0.2)

## 2020-02-07 LAB — GLUCOSE, CAPILLARY
Glucose-Capillary: 188 mg/dL — ABNORMAL HIGH (ref 70–99)
Glucose-Capillary: 179 mg/dL — ABNORMAL HIGH (ref 70–99)

## 2020-02-07 LAB — TROPONIN I (HIGH SENSITIVITY)
Troponin I (High Sensitivity): 32 ng/L — ABNORMAL HIGH (ref <18)
Troponin I (High Sensitivity): 27 ng/L — ABNORMAL HIGH (ref <18)

## 2020-02-07 LAB — HIV ANTIBODY (ROUTINE TESTING W REFLEX): HIV Screen 4th Generation wRfx: NONREACTIVE

## 2020-02-07 LAB — CBG MONITORING, ED
Glucose-Capillary: 275 mg/dL — ABNORMAL HIGH (ref 70–99)
Glucose-Capillary: 282 mg/dL — ABNORMAL HIGH (ref 70–99)

## 2020-02-07 LAB — CREATININE, SERUM
Creatinine, Ser: 1.38 mg/dL — ABNORMAL HIGH (ref 0.61–1.24)
GFR calc Af Amer: >60 mL/min (ref >60)
GFR calc non Af Amer: 56 mL/min — ABNORMAL LOW (ref >60)

## 2020-02-07 LAB — SARS CORONAVIRUS 2 (TAT 6-24 HRS): SARS Coronavirus 2: NEGATIVE

## 2020-02-07 LAB — HEMOGLOBIN A1C
Hgb A1c MFr Bld: 7.8 % — ABNORMAL HIGH (ref 4.8–5.6)
Mean Plasma Glucose: 177.16 mg/dL

## 2020-02-07 MED ORDER — PALIPERIDONE ER 3 MG PO TB24
3.0000 mg | ORAL_TABLET | Freq: Every day | ORAL | Status: DC
  Administered 2020-02-07 – 2020-02-10 (×4): 3 mg via ORAL
  Filled 2020-02-07 (×4): qty 1

## 2020-02-07 MED ORDER — CLONIDINE HCL 0.2 MG PO TABS
0.2000 mg | ORAL_TABLET | Freq: Once | ORAL | Status: AC
  Administered 2020-02-07: 0.2 mg via ORAL
  Filled 2020-02-07: qty 1

## 2020-02-07 MED ORDER — IBUPROFEN 400 MG PO TABS
400.0000 mg | ORAL_TABLET | Freq: Once | ORAL | Status: AC
  Administered 2020-02-07: 400 mg via ORAL
  Filled 2020-02-07: qty 1

## 2020-02-07 MED ORDER — SODIUM CHLORIDE 0.9 % IV SOLN: INTRAVENOUS | Status: AC

## 2020-02-07 MED ORDER — HYDRALAZINE HCL 20 MG/ML IJ SOLN
10.0000 mg | INTRAMUSCULAR | Status: DC | PRN
  Administered 2020-02-08 – 2020-02-10 (×3): 10 mg via INTRAVENOUS
  Filled 2020-02-07 (×3): qty 1

## 2020-02-07 MED ORDER — LABETALOL HCL 200 MG PO TABS: 200.0000 mg | ORAL_TABLET | Freq: Two times a day (BID) | ORAL | Status: DC

## 2020-02-07 MED ORDER — CLOPIDOGREL BISULFATE 75 MG PO TABS
75.0000 mg | ORAL_TABLET | Freq: Every day | ORAL | Status: DC
  Administered 2020-02-07 – 2020-02-10 (×4): 75 mg via ORAL
  Filled 2020-02-07 (×4): qty 1

## 2020-02-07 MED ORDER — GABAPENTIN 300 MG PO CAPS
300.0000 mg | ORAL_CAPSULE | Freq: Three times a day (TID) | ORAL | Status: DC
  Administered 2020-02-07 – 2020-02-10 (×11): 300 mg via ORAL
  Filled 2020-02-07 (×8): qty 1
  Filled 2020-02-07: qty 3
  Filled 2020-02-07 (×2): qty 1

## 2020-02-07 MED ORDER — HYDRALAZINE HCL 10 MG PO TABS
10.0000 mg | ORAL_TABLET | Freq: Three times a day (TID) | ORAL | Status: DC
  Administered 2020-02-07 – 2020-02-10 (×9): 10 mg via ORAL
  Filled 2020-02-07 (×9): qty 1

## 2020-02-07 MED ORDER — INSULIN ASPART 100 UNIT/ML ~~LOC~~ SOLN
0.0000 [IU] | Freq: Three times a day (TID) | SUBCUTANEOUS | Status: DC
  Administered 2020-02-07 – 2020-02-08 (×2): 3 [IU] via SUBCUTANEOUS
  Administered 2020-02-08 (×2): 5 [IU] via SUBCUTANEOUS
  Administered 2020-02-09 (×2): 3 [IU] via SUBCUTANEOUS
  Administered 2020-02-09 – 2020-02-10 (×2): 5 [IU] via SUBCUTANEOUS
  Administered 2020-02-10 (×2): 3 [IU] via SUBCUTANEOUS

## 2020-02-07 MED ORDER — INSULIN ASPART 100 UNIT/ML ~~LOC~~ SOLN
0.0000 [IU] | Freq: Three times a day (TID) | SUBCUTANEOUS | Status: DC
  Administered 2020-02-07 (×2): 5 [IU] via SUBCUTANEOUS

## 2020-02-07 MED ORDER — CLONIDINE HCL 0.1 MG PO TABS
0.1000 mg | ORAL_TABLET | Freq: Once | ORAL | Status: AC
  Administered 2020-02-07: 0.1 mg via ORAL
  Filled 2020-02-07: qty 1

## 2020-02-07 MED ORDER — ACETAMINOPHEN 325 MG PO TABS
650.0000 mg | ORAL_TABLET | Freq: Four times a day (QID) | ORAL | Status: DC | PRN
  Administered 2020-02-08 – 2020-02-10 (×6): 650 mg via ORAL
  Filled 2020-02-07 (×6): qty 2

## 2020-02-07 MED ORDER — CLONIDINE HCL 0.1 MG PO TABS
0.1000 mg | ORAL_TABLET | Freq: Three times a day (TID) | ORAL | Status: DC
  Administered 2020-02-07: 0.1 mg via ORAL
  Filled 2020-02-07: qty 1

## 2020-02-07 MED ORDER — ONDANSETRON HCL 4 MG/2ML IJ SOLN: 4.0000 mg | Freq: Four times a day (QID) | INTRAMUSCULAR | Status: DC | PRN

## 2020-02-07 MED ORDER — ENOXAPARIN SODIUM 40 MG/0.4ML ~~LOC~~ SOLN
40.0000 mg | SUBCUTANEOUS | Status: DC
  Administered 2020-02-07 – 2020-02-10 (×4): 40 mg via SUBCUTANEOUS
  Filled 2020-02-07 (×3): qty 0.4

## 2020-02-07 MED ORDER — ACETAMINOPHEN 650 MG RE SUPP: 650.0000 mg | Freq: Four times a day (QID) | RECTAL | Status: DC | PRN

## 2020-02-07 MED ORDER — OXYCODONE-ACETAMINOPHEN 5-325 MG PO TABS
1.0000 | ORAL_TABLET | Freq: Once | ORAL | Status: AC
  Administered 2020-02-07: 1 via ORAL
  Filled 2020-02-07: qty 1

## 2020-02-07 MED ORDER — INSULIN ASPART 100 UNIT/ML ~~LOC~~ SOLN
0.0000 [IU] | Freq: Every day | SUBCUTANEOUS | Status: DC
  Administered 2020-02-08: 2 [IU] via SUBCUTANEOUS

## 2020-02-07 MED ORDER — LABETALOL HCL 200 MG PO TABS
200.0000 mg | ORAL_TABLET | Freq: Two times a day (BID) | ORAL | Status: DC
  Administered 2020-02-07 – 2020-02-09 (×5): 200 mg via ORAL
  Filled 2020-02-07 (×5): qty 1

## 2020-02-07 MED ORDER — DICLOFENAC SODIUM 1 % EX GEL
2.0000 g | Freq: Four times a day (QID) | CUTANEOUS | Status: DC
  Administered 2020-02-07 – 2020-02-10 (×13): 2 g via TOPICAL
  Filled 2020-02-07: qty 100

## 2020-02-07 MED ORDER — ONDANSETRON HCL 4 MG PO TABS: 4.0000 mg | ORAL_TABLET | Freq: Four times a day (QID) | ORAL | Status: DC | PRN

## 2020-02-07 NOTE — ED Notes (Signed)
Pt drowsy and wakes with slurred speech, difficult to understand.  Does state he has hx of stroke, unsure if this his baseline.  Denies any pain at this time.  Ready to eat breakfast.  Able to sit up and appropriately take fluids.

## 2020-02-07 NOTE — Progress Notes (Signed)
Progress Note    [name]  SWF:[id number] DOB: 04-09-1961  DOA: 02/06/2020 PCP: [facility]    Brief Narrative:   Chief complaint: Left hip pain.  Medical records reviewed and are as summarized below:  [name] is an 59 y.o. male with a past medical history that includes diabetes, hypertension, polysubstance abuse, schizophrenia, chronic systolic heart failure presented to the emergency department March 21 chief complaint of left hip pain.  Work-up revealed hypertensive urgency with hyperglycemia and abnormal EKG.  Assessment/Plan:   Principal Problem:   Hypertensive urgency Active Problems:   Left hip pain   AKI (acute kidney injury) ([place])   Abnormal EKG   Diabetes mellitus (HCC)   Cerebrovascular disease   Schizoaffective disorder, bipolar type ([place])   Systolic heart failure secondary to hypertension ([place])   #1.  Hypertensive urgency.  Likely related to noncompliance.  Patient recently moved in with his daughter who is in the process of "taking over" responsibilities for his medications and his finances.  He was recently in a group home that discharged him.  Chart review indicates he was seen by PCP 10 days ago and at that time he had run out of medications.  The plan according to that note was to stop clonidine and start labetalol with hydralazine.  I spoke with the daughter who indicates after counting the medications that the patient had run out of labetalol.  He was provided with IV hydralazine clonidine and labetalol was resumed.  Today's blood pressure is improved somewhat but remains elevated.  Denies headache visual disturbances, dizziness.  Of note further chart review indicates home medications also included amlodipine, losartan, Hygroton.  Note dated March 12 indicated Hygroton and losartan on hold to be resumed per PCP with increasing blood pressure. -Stop clonidine -Resume labetalol -Resume scheduled hydralazine -As  needed hydralazine -Monitor closely anticipate needing to resume losartan and amlodipine.  #2.  Diabetes.  Serum glucose 320 on admission.  Home medications include Metformin.  Hemoglobin A1c 7.8.  Unclear if he is compliant. -Sliding scale insulin for optimal control will increase to moderate and add HS coverage -Hold his oral agents for now as his appetite is unreliable  #3.  Abnormal EKG.  Patient denies chest pain.  Sensitivity troponin slightly elevated but flat.  EKG Sinus rhythm Right axis deviation Abnormal R-wave progression, late transition Abnormal T When compared with ECG of EARLIER SAME DATE T wave inversion Lateral leads is more.  Chest x-ray reveals cardiomegaly with aortic tortuosity or dilation and clear lungs. -Monitor on telemetry -Repeat EKG in the morning  #4.  Left hip pain.  This is chronic.  Patient states it has been worse over the last couple of days.  Denies any fall.  I spoke with the daughter who confirms.  X-ray of that hip is negative for any bony abnormality specifically no fracture or dislocation. -Voltaren -Physical therapy -Supportive therapy  #5. bipolar disorder/CVA.  Stable at baseline.  Daughter confirms patient is at his baseline with regards to his dysarthria. Home meds include invega and plavix -resume invega -resume plavix  #6.  Chronic systolic heart failure.  Care everywhere indicates patient has a history of this.  Does not appear overloaded.  Chart review indicates home medications include losartan which is on hold for now according to chart for unclear reasons. -Anticipate resuming losartan -Daily weights -He can output  #7.  Acute kidney injury.  Creatinine 1.38.  Likely related to #1. -Gentle IV fluid -Hold  nephrotoxins -Monitor urine output -Recheck in the morning  Family Communication/Anticipated D/C date and plan/Code Status   DVT prophylaxis: Lovenox ordered. Code Status: Full Code.  Family Communication: Daughter on  telephone Disposition Plan: Home with daughter once blood pressure is controlled   Medical Consultants:    None.   Anti-Infectives:    None  Subjective:   Awake alert oriented to self and place.  States that the pain in his left hip "is better".  Denies chest pain palpitations or shortness of breath  Objective:    Vitals:   02/07/20 1130 02/07/20 1145 02/07/20 1200 02/07/20 1231  BP: 140/87 (!) 121/96 (!) 144/86 (!) 158/113  Pulse: 80 77 74 82  Resp: 13 10 13 16   Temp:    97.9 F (36.6 C)  TempSrc:    Oral  SpO2: 98% 99% 96% 100%  Weight:      Height:       No intake or output data in the 24 hours ending 02/07/20 1417 Filed Weights   02/06/20 1959  Weight: 95.3 kg    Exam: General: Awake alert well-nourished no acute distress CV: Regular rate and rhythm no murmur gallop or rub no lower extremity edema Respiratory: No increased work of breathing breath sounds are clear bilaterally Abdomen nondistended soft positive bowel sounds throughout no guarding or rebounding Musculoskeletal joints without swelling/erythema left hip nontender to palpation full range of motion Neuro: Awake alert oriented x3 speech slow ans slightly slurred.  Lateral grip 5 out of 5.  Lower extremity strength 5 out of 5 bilaterally  Data Reviewed:   I have personally reviewed following labs and imaging studies:  Labs: Labs show the following:   Basic Metabolic Panel: Recent Labs  Lab 02/06/20 2019 02/07/20 0435  NA 134*  --   K 3.9  --   CL 97*  --   CO2 23  --   GLUCOSE 320*  --   BUN 19  --   CREATININE 1.23 1.38*  CALCIUM 9.3  --    GFR Estimated Creatinine Clearance: 69.6 mL/min (A) (by C-G formula based on SCr of 1.38 mg/dL (H)). Liver Function Tests: Recent Labs  Lab 02/06/20 2019  AST 19  ALT 10  ALKPHOS 90  BILITOT 0.8  PROT 7.3  ALBUMIN 3.7   No results for input(s): LIPASE, AMYLASE in the last 168 hours. No results for input(s): AMMONIA in the last 168  hours. Coagulation profile No results for input(s): INR, PROTIME in the last 168 hours.  CBC: Recent Labs  Lab 02/06/20 2019 02/07/20 0435  WBC 8.8 7.5  NEUTROABS 4.9  --   HGB 13.1 11.7*  HCT 41.1 37.5*  MCV 85.8 86.6  PLT 242 231   Cardiac Enzymes: No results for input(s): CKTOTAL, CKMB, CKMBINDEX, TROPONINI in the last 168 hours. BNP (last 3 results) No results for input(s): PROBNP in the last 8760 hours. CBG: Recent Labs  Lab 02/07/20 0803 02/07/20 1211  GLUCAP 275* 282*   D-Dimer: No results for input(s): DDIMER in the last 72 hours. Hgb A1c: Recent Labs    02/07/20 0435  HGBA1C 7.8*   Lipid Profile: No results for input(s): CHOL, HDL, LDLCALC, TRIG, CHOLHDL, LDLDIRECT in the last 72 hours. Thyroid function studies: No results for input(s): TSH, T4TOTAL, T3FREE, THYROIDAB in the last 72 hours.  Invalid input(s): FREET3 Anemia work up: No results for input(s): VITAMINB12, FOLATE, FERRITIN, TIBC, IRON, RETICCTPCT in the last 72 hours. Sepsis Labs: Recent Labs  Lab 02/06/20  2019 02/07/20 0435  WBC 8.8 7.5    Microbiology Recent Results (from the past 240 hour(s))  SARS CORONAVIRUS 2 (TAT 6-24 HRS) Nasopharyngeal Nasopharyngeal Swab     Status: None   Collection Time: 02/07/20  4:31 AM   Specimen: Nasopharyngeal Swab  Result Value Ref Range Status   SARS Coronavirus 2 NEGATIVE NEGATIVE Final    Comment: (NOTE) SARS-CoV-2 target nucleic acids are NOT DETECTED. The SARS-CoV-2 RNA is generally detectable in upper and lower respiratory specimens during the acute phase of infection. Negative results do not preclude SARS-CoV-2 infection, do not rule out co-infections with other pathogens, and should not be used as the sole basis for treatment or other patient management decisions. Negative results must be combined with clinical observations, patient history, and epidemiological information. The expected result is Negative. Fact Sheet for  Patients: SugarRoll.be Fact Sheet for Healthcare Providers: https://www.woods-mathews.com/ This test is not yet approved or cleared by the [place] FDA and  has been authorized for detection and/or diagnosis of SARS-CoV-2 by FDA under an Emergency Use Authorization (EUA). This EUA will remain  in effect (meaning this test can be used) for the duration of the COVID-19 declaration under Section 56 4(b)(1) of the Act, 21 U.S.C. section 360bbb-3(b)(1), unless the authorization is terminated or revoked sooner. Performed at [facility], [place] [address] [place], [place] [address]     Procedures and diagnostic studies:  DG CHEST PORT 1 VIEW  Result Date: 02/07/2020 CLINICAL DATA:  Shortness of breath EXAM: PORTABLE CHEST 1 VIEW COMPARISON:  Six days ago FINDINGS: Cardiomegaly and aortic tortuosity versus dilatation. Mediastinal contours are accentuated by the lower volumes when compared to prior There is no edema, consolidation, effusion, or pneumothorax. IMPRESSION: 1. Cardiomegaly with aortic tortuosity or dilatation. 2. Clear lungs. Electronically Signed   By: Monte Fantasia M.D.   On: 02/07/2020 06:50   DG Hip Unilat W or Wo Pelvis 2-3 Views Left  Result Date: 02/06/2020 CLINICAL DATA:  Chronic low back pain radiating to left leg EXAM: DG HIP (WITH OR WITHOUT PELVIS) 2-3V LEFT COMPARISON:  None. FINDINGS: Hip joints and SI joints symmetric and unremarkable. No acute bony abnormality. Specifically, no fracture, subluxation, or dislocation. IMPRESSION: Negative. Electronically Signed   By: Rolm Baptise M.D.   On: 02/06/2020 23:48    Medications:   . clopidogrel  75 mg Oral Daily  . diclofenac Sodium  2 g Topical QID  . enoxaparin (LOVENOX) injection  40 mg Subcutaneous Q24H  . gabapentin  300 mg Oral TID  . hydrALAZINE  10 mg Oral Q8H  . insulin aspart  0-15 Units Subcutaneous TID WC  . insulin aspart  0-5 Units Subcutaneous QHS  .  labetalol  200 mg Oral BID  . paliperidone  3 mg Oral Daily   Continuous Infusions: . sodium chloride 50 mL/hr at 02/07/20 1238     LOS: 0 days   Radene Gunning NP [facility]   How to contact the [facility] Attending or Consulting provider [place] or covering provider during after hours [place], for this patient?  1. Check the care team in [facility] and look for a) attending/consulting [facility] provider listed and b) the [facility] team listed 2. Log into [email or web address] and use [place]'s universal password to access. If you do not have the password, please contact the hospital operator. 3. Locate the [facility] provider you are looking for under [facility] and page to a number that you can be directly reached.  4. If you still have difficulty reaching the provider, please page the [facility] (Director on Call) for the Hospitalists listed on amion for assistance.  02/07/2020, 2:17 PM

## 2020-02-07 NOTE — ED Notes (Signed)
[name] CBG result was 282. Informed Selinda Eon - RN.

## 2020-02-07 NOTE — ED Notes (Signed)
Daughter called back and update given.   She is concerned about his BP and and ingrown toenail being addressed while he is here.  Daughter has been involved in care with pt for only 4-5 weeks and has been addressing his BP and medical issues.  Daughter states his speech and weakness is baseline for him post CVA.  Has been attempting to get PT/OT outpatient as well.    Brother is also a contact:  Same #.

## 2020-02-07 NOTE — ED Notes (Signed)
Lunch Tray Ordered @ 1055. 

## 2020-02-07 NOTE — H&P (Addendum)
History and Physical    [name] LKJ:[id number] DOB: 10-03-1961 DOA: 02/06/2020  PCP: [facility], [facility]  Patient coming from: Home.  Chief Complaint: Left hip pain.  HPI: [name] is a 59 y.o. male with history of hypertension diabetes mellitus polysubstance abuse presents to the ER because of increasing pain in the left hip area.  Patient has been having this pain over the last few weeks and I also had followed up with his primary care physician during his second week of March about a week ago.  Denies any trauma or fall.  Denies any fever chills.  Patient states he at times gets short of breath but presently not short of breath but denies any chest pain.  Not sure if he is compliant with medication patient is also a poor historian.  ED Course: In the ER x-rays show nothing acute in the left hip.  Left knee does not show any obvious effusion.  Labs show CBC unremarkable blood glucose 320 sodium 134 urine shows proteinuria with glucose.  Covid test is pending.  Chest x-ray is pending.  EKG shows normal sinus rhythm with an lateral leads showing some T wave inversions.  Patient was found to have very elevated blood pressure with systolic in the 794 and diastolic in the 801K despite giving multiple doses of antihypertensives.  Patient admitted for further observation.  Review of Systems: As per HPI, rest all negative.   Past Medical History:  Diagnosis Date  . Alcohol abuse   . Arthritis   . Crack cocaine use   . Depression   . Diabetes mellitus   . Drug abuse ([place])   . DTs (delirium tremens) ([place])    history of   . Gout   . Noncompliance with medication regimen   . Schizophrenia ([place])   . Stroke ([place])   . 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)   . Uncontrolled hypertension     Past Surgical History:  Procedure Laterality Date  . LEFT HEART CATH AND CORONARY ANGIOGRAPHY N/A 02/03/2018   Procedure: LEFT HEART CATH AND CORONARY  ANGIOGRAPHY;  Surgeon: Jettie Booze, MD;  Location: [facility];  Service: Cardiovascular;  Laterality: N/A;  . MANDIBLE RECONSTRUCTION    . TONSILLECTOMY       reports that he has been smoking cigarettes. He has been smoking about 0.25 packs per day. He has never used smokeless tobacco. He reports current alcohol use of about 1.0 - 2.0 standard drinks of alcohol per week. He reports current drug use. Drugs: "Crack" cocaine and Cocaine.  No Known Allergies  Family History  Problem Relation Age of Onset  . Hypertension Other     Prior to Admission medications   Medication Sig Start Date End Date Taking? Authorizing Provider  gabapentin (NEURONTIN) 300 MG capsule Take 1 capsule (300 mg total) by mouth 3 (three) times daily. 02/19/18  Yes Patrecia Pour, NP  DULoxetine (CYMBALTA) 30 MG capsule Take 1 capsule (30 mg total) by mouth 2 (two) times daily. Patient not taking: Reported on 02/17/2018 02/15/18   Patrecia Pour, NP  metFORMIN (GLUCOPHAGE) 500 MG tablet Take 1 tablet (500 mg total) by mouth 2 (two) times daily with a meal. Patient not taking: Reported on 02/17/2018 02/15/18   Patrecia Pour, NP  paliperidone (INVEGA) 3 MG 24 hr tablet Take 1 tablet (3 mg total) by mouth daily. Patient not taking: Reported on 02/17/2018 02/15/18   Patrecia Pour, NP  Physical Exam: Constitutional: Moderately built and nourished. Vitals:   02/07/20 0245 02/07/20 0304 02/07/20 0315 02/07/20 0419  BP: (!) 192/132 (!) 174/136 (!) 204/133 (!) 173/127  Pulse: 100  98 91  Resp:    20  Temp:      TempSrc:      SpO2: 97%  99% 99%  Weight:      Height:       Eyes: Anicteric no pallor. ENMT: No discharge from the ears eyes nose or mouth. Neck: No mass or.  No neck rigidity. Respiratory: No rhonchi or crepitations. Cardiovascular: S1-S2 heard. Abdomen: Soft nontender bowel sounds present. Musculoskeletal: Mild pain on moving left hip.  No obvious swelling or any swelling of the left hip  or left knee. Skin: No rash. Neurologic: Patient is mildly lethargic but is oriented to his name and place.  Moves all extremities. Psychiatric: Mildly lethargic.   Labs on Admission: I have personally reviewed following labs and imaging studies  CBC: Recent Labs  Lab 02/06/20 2019  WBC 8.8  NEUTROABS 4.9  HGB 13.1  HCT 41.1  MCV 85.8  PLT 681   Basic Metabolic Panel: Recent Labs  Lab 02/06/20 2019  NA 134*  K 3.9  CL 97*  CO2 23  GLUCOSE 320*  BUN 19  CREATININE 1.23  CALCIUM 9.3   GFR: Estimated Creatinine Clearance: 78.1 mL/min (by C-G formula based on SCr of 1.23 mg/dL). Liver Function Tests: Recent Labs  Lab 02/06/20 2019  AST 19  ALT 10  ALKPHOS 90  BILITOT 0.8  PROT 7.3  ALBUMIN 3.7   No results for input(s): LIPASE, AMYLASE in the last 168 hours. No results for input(s): AMMONIA in the last 168 hours. Coagulation Profile: No results for input(s): INR, PROTIME in the last 168 hours. Cardiac Enzymes: No results for input(s): CKTOTAL, CKMB, CKMBINDEX, TROPONINI in the last 168 hours. BNP (last 3 results) No results for input(s): PROBNP in the last 8760 hours. HbA1C: No results for input(s): HGBA1C in the last 72 hours. CBG: No results for input(s): GLUCAP in the last 168 hours. Lipid Profile: No results for input(s): CHOL, HDL, LDLCALC, TRIG, CHOLHDL, LDLDIRECT in the last 72 hours. Thyroid Function Tests: No results for input(s): TSH, T4TOTAL, FREET4, T3FREE, THYROIDAB in the last 72 hours. Anemia Panel: No results for input(s): VITAMINB12, FOLATE, FERRITIN, TIBC, IRON, RETICCTPCT in the last 72 hours. Urine analysis:    Component Value Date/Time   COLORURINE YELLOW 02/06/2020 2330   APPEARANCEUR CLEAR 02/06/2020 2330   LABSPEC 1.013 02/06/2020 2330   PHURINE 5.0 02/06/2020 2330   GLUCOSEU >=500 (A) 02/06/2020 2330   HGBUR NEGATIVE 02/06/2020 2330   BILIRUBINUR NEGATIVE 02/06/2020 2330   KETONESUR NEGATIVE 02/06/2020 2330   PROTEINUR  >=300 (A) 02/06/2020 2330   UROBILINOGEN 0.2 08/27/2015 0210   NITRITE NEGATIVE 02/06/2020 2330   LEUKOCYTESUR NEGATIVE 02/06/2020 2330   Sepsis Labs: @LABRCNTIP (procalcitonin:4,lacticidven:4) )No results found for this or any previous visit (from the past 240 hour(s)).   Radiological Exams on Admission: DG Hip Unilat W or Wo Pelvis 2-3 Views Left  Result Date: 02/06/2020 CLINICAL DATA:  Chronic low back pain radiating to left leg EXAM: DG HIP (WITH OR WITHOUT PELVIS) 2-3V LEFT COMPARISON:  None. FINDINGS: Hip joints and SI joints symmetric and unremarkable. No acute bony abnormality. Specifically, no fracture, subluxation, or dislocation. IMPRESSION: Negative. Electronically Signed   By: Rolm Baptise M.D.   On: 02/06/2020 23:48    EKG: Independently reviewed.  Normal sinus rhythm  with nonspecific ST changes in lateral leads.  Assessment/Plan Principal Problem:   Hypertensive urgency Active Problems:   Diabetes mellitus ([place])   Schizoaffective disorder, bipolar type ([place])    1. Hypertensive urgency -not sure of is compliant with medication.  Per recent PCPs note patient is supposed to be on clonidine 0.1 mg 3 times daily and labetalol 200 mg p.o. twice daily.  I have placed patient on these 2 medication and patient states he has not taken any drugs or alcohol for last more than a year.  If urine drug screen is positive for cocaine may have discontinued labetalol.  As needed IV hydralazine.  Follow blood pressure trends. 2. Abnormal EKG showing some lateral wall T wave changes for which I have ordered cardiac markers.  Patient denies any chest pain.  Chest x-ray is pending. 3. Left hip pain cause not clear appears to be chronic.  X-rays do not show anything acute. 4. Diabetes mellitus type 2 we will keep patient sliding scale coverage.  Patient is supposed to be on Metformin not sure if he is taking.  Follow CBGs. 5. History of polysubstance abuse patient states he has not taken any  including alcohol or cocaine for the last 1 year.  Urine drug screen is pending. 6. Patient appears mildly lethargic could be from patient likely sleeping but closely monitor.  Urine drug screen is pending.  Covid test is pending.   DVT prophylaxis: Lovenox. Code Status: Full code. Family Communication: Discussed with patient. Disposition Plan: Home. Consults called: None. Admission status: Observation.   Rise Patience MD [facility] Pager [phone].  If 7PM-7AM, please contact night-coverage [email or web address] Password [facility]  02/07/2020, 4:38 AM

## 2020-02-08 DIAGNOSIS — R9431 Abnormal electrocardiogram [ECG] [EKG]: Secondary | ICD-10-CM

## 2020-02-08 DIAGNOSIS — Z794 Long term (current) use of insulin: Secondary | ICD-10-CM

## 2020-02-08 DIAGNOSIS — E0822 Diabetes mellitus due to underlying condition with diabetic chronic kidney disease: Secondary | ICD-10-CM

## 2020-02-08 DIAGNOSIS — F250 Schizoaffective disorder, bipolar type: Secondary | ICD-10-CM

## 2020-02-08 DIAGNOSIS — N185 Chronic kidney disease, stage 5: Secondary | ICD-10-CM

## 2020-02-08 DIAGNOSIS — N179 Acute kidney failure, unspecified: Secondary | ICD-10-CM

## 2020-02-08 LAB — BASIC METABOLIC PANEL
Anion gap: 9 (ref 5–15)
BUN: 19 mg/dL (ref 6–20)
CO2: 25 mmol/L (ref 22–32)
Calcium: 8.8 mg/dL — ABNORMAL LOW (ref 8.9–10.3)
Chloride: 103 mmol/L (ref 98–111)
Creatinine, Ser: 1.11 mg/dL (ref 0.61–1.24)
GFR calc Af Amer: >60 mL/min (ref >60)
GFR calc non Af Amer: >60 mL/min (ref >60)
Glucose, Bld: 194 mg/dL — ABNORMAL HIGH (ref 70–99)
Potassium: 3.9 mmol/L (ref 3.5–5.1)
Sodium: 137 mmol/L (ref 135–145)

## 2020-02-08 LAB — GLUCOSE, CAPILLARY
Glucose-Capillary: 203 mg/dL — ABNORMAL HIGH (ref 70–99)
Glucose-Capillary: 204 mg/dL — ABNORMAL HIGH (ref 70–99)
Glucose-Capillary: 198 mg/dL — ABNORMAL HIGH (ref 70–99)
Glucose-Capillary: 220 mg/dL — ABNORMAL HIGH (ref 70–99)

## 2020-02-08 MED ORDER — AMLODIPINE BESYLATE 5 MG PO TABS
5.0000 mg | ORAL_TABLET | Freq: Every day | ORAL | Status: DC
  Administered 2020-02-08 – 2020-02-09 (×2): 5 mg via ORAL
  Filled 2020-02-08 (×2): qty 1

## 2020-02-08 NOTE — Evaluation (Signed)
Physical Therapy Evaluation Patient Details Name: [name] MRN: [id number] DOB: 16-Mar-1961 Today's Date: 02/08/2020   History of Present Illness  59 yo male with onset of L hip pain worsening was admitted, found to have hypertensive urgency.  Has negative L hip and pelvic imaging, but has noted chronic hip pain.  Has AKI, cardiomegaly, PMHx:  bipolar disorder, DM, schizophrenia, HTN, CVA, CHF, dysarthria, medicine noncompliance, low K+, tachycardia, EtOH use, cocaine use, chest pain, gout, depression, tobacco use,   Clinical Impression  Pt was seen for mobility on RW and to use safe mobility in his room.  Pt is on cath but concerned about having paper garments that the hallway does not carry.  Follow acutely to get pt into safer balance habits, to increase his tolerance for mobility and to hopefully do ROM on L hip and reduce his discomfort, increase fluidity of movement.  HHPT to follow up with family to assist at DC.      Follow Up Recommendations Home health PT;Supervision for mobility/OOB;Supervision/Assistance - 24 hour    Equipment Recommendations  Rolling walker with 5" wheels    Recommendations for Other Services       Precautions / Restrictions Precautions Precautions: Fall Precaution Comments: no recent falls reported Restrictions Weight Bearing Restrictions: No      Mobility  Bed Mobility Overal bed mobility: Modified Independent                Transfers Overall transfer level: Modified independent Equipment used: Rolling walker (2 wheeled)             General transfer comment: STS and SPT  Ambulation/Gait Ambulation/Gait assistance: Min guard Gait Distance (Feet): 90 Feet(45 x 2) Assistive device: Rolling walker (2 wheeled) Gait Pattern/deviations: Decreased stride length;Wide base of support;Shuffle Gait velocity: reduced, variable Gait velocity interpretation: <1.31 ft/sec, indicative of household ambulator General Gait Details: has  reduced WB on LLE with RW support making his gait more equal and safe  Stairs            Wheelchair Mobility    Modified Rankin (Stroke Patients Only)       Balance Overall balance assessment: Needs assistance Sitting-balance support: Feet supported Sitting balance-Leahy Scale: Good     Standing balance support: Bilateral upper extremity supported;During functional activity Standing balance-Leahy Scale: Fair Standing balance comment: less than fair dynamic balance                             Pertinent Vitals/Pain Pain Assessment: Faces Faces Pain Scale: Hurts even more Pain Location: L hip and anterior thigh    Home Living Family/patient expects to be discharged to:: Private residence Living Arrangements: Children Available Help at Discharge: Family;Available 24 hours/day Type of Home: House Home Access: Stairs to enter   [employer] of Steps: 3   Home Equipment: Walker - 2 wheels      Prior Function Level of Independence: Independent with assistive device(s)         Comments: has been using RW for some time     Hand Dominance   Dominant Hand: Right    Extremity/Trunk Assessment   Upper Extremity Assessment Upper Extremity Assessment: Overall WFL for tasks assessed    Lower Extremity Assessment Lower Extremity Assessment: LLE deficits/detail LLE Deficits / Details: pain complaints LLE Sensation: decreased light touch LLE Coordination: decreased gross motor    Cervical / Trunk Assessment Cervical / Trunk Assessment: Normal  Communication  Communication: Expressive difficulties  Cognition Arousal/Alertness: Awake/alert Behavior During Therapy: Impulsive Overall Cognitive Status: Difficult to assess                                        General Comments General comments (skin integrity, edema, etc.): pt was seen for evaluation and note his struggle with tolerating mobility is not related to strength  as LLE is [facility] for mm tests of ankles and hips    Exercises     Assessment/Plan    PT Assessment Patient needs continued PT services  PT Problem List Decreased strength;Decreased range of motion;Decreased activity tolerance;Decreased balance;Decreased mobility;Decreased coordination;Decreased knowledge of use of DME;Decreased safety awareness;Cardiopulmonary status limiting activity;Pain       PT Treatment Interventions DME instruction;Gait training;Stair training;Functional mobility training;Therapeutic activities;Therapeutic exercise;Balance training;Neuromuscular re-education;Patient/family education    PT Goals (Current goals can be found in the Care Plan section)  Acute Rehab PT Goals Patient Stated Goal: to walk alone and get home PT Goal Formulation: With patient Time For Goal Achievement: 02/22/20 Potential to Achieve Goals: Good    Frequency Min 3X/week   Barriers to discharge Inaccessible home environment has good family support    Co-evaluation               AM-PAC PT "6 Clicks" Mobility  Outcome Measure Help needed turning from your back to your side while in a flat bed without using bedrails?: None Help needed moving from lying on your back to sitting on the side of a flat bed without using bedrails?: A Little Help needed moving to and from a bed to a chair (including a wheelchair)?: A Little Help needed standing up from a chair using your arms (e.g., wheelchair or bedside chair)?: A Little Help needed to walk in hospital room?: A Little Help needed climbing 3-5 steps with a railing? : A Lot 6 Click Score: 18    End of Session Equipment Utilized During Treatment: Gait belt Activity Tolerance: Patient limited by fatigue;Patient limited by pain Patient left: in bed;with call bell/phone within reach;with bed alarm set Nurse Communication: Mobility status PT Visit Diagnosis: Unsteadiness on feet (R26.81);Other abnormalities of gait and mobility  (R26.89);Difficulty in walking, not elsewhere classified (R26.2);Hemiplegia and hemiparesis;Pain Hemiplegia - Right/Left: Right Hemiplegia - dominant/non-dominant: Dominant Pain - Right/Left: Left Pain - part of body: Hip    Time: 3545-6256 PT Time Calculation (min) (ACUTE ONLY): 31 min   Charges:   PT Evaluation $PT Eval Moderate Complexity: 1 Mod PT Treatments $Gait Training: 8-22 mins       Ramond Dial 02/08/2020, 3:25 PM  Mee Hives, PT MS Acute Rehab Dept. Number: [place]

## 2020-02-08 NOTE — Progress Notes (Signed)
[name]  PROGRESS NOTE    [name]  LPF:[id number] DOB: 08/08/1961 DOA: 02/06/2020 PCP: [facility], [facility]   Brief Narrative:   [name] is an 59 y.o. male with a past medical history that includes diabetes, hypertension, polysubstance abuse, schizophrenia, chronic systolic heart failure presented to the emergency department March 21 chief complaint of left hip pain.  Work-up revealed hypertensive urgency with hyperglycemia and abnormal EKG.  3/23: BP are still quite elevated. Let's add back his amlodipine. Glucose is acceptable. He will probably need to go home on more than metformin though, but he is not open to insulin right now. Will need to speak with his dtr. Continue current Tx otherwise. Likely home in next 24 hrs as BP becomes controlled.    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 HTN       - Likely related to noncompliance.       - He was recently in a group home that discharged him; he daughter is now trying to help out.       - Chart review indicates he was seen by PCP 10 days ago and at that time he had run out of medications.  The plan according to that note was to stop clonidine and start labetalol with hydralazine.     - currently on labetolol and hydralazine (he is already tachy, so will not increase hydralazine at this point)     - 3/23: BP still not controlled, add norvasc; monitor  DM2 w/o long term insulin use       - Serum glucose 320 on admission.       - Home medications include Metformin.       - Hemoglobin A1c 7.8.     - Sliding scale insulin for optimal control will increase to moderate and add HS coverage     - 3/23: At discharge, he can  resume his metformin and will probably need another agent, but not open to insulin use right now  Abnormal EKG       - Patient denies chest pain.       - Sensitivity troponin slightly elevated but flat.       - EKG Sinus rhythm Right axis deviation Abnormal R-wave progression, late transition     - Abnormal T When compared with ECG of EARLIER SAME DATE T wave inversion Lateral leads is more.       - Chest x-ray reveals cardiomegaly with aortic tortuosity or dilation and clear lungs.     - stable, monitor  Left hip pain; chronic.       - Patient states it has been worse over the last couple of days.     - Denies any fall.     - X-ray of that hip is negative for any bony abnormality specifically no fracture or dislocation.     - Voltaren     - 3/23: PT to see  Bipolar disorder/CVA.       - Stable at baseline.       - Daughter confirms patient is at his baseline with regards to his dysarthria.      - Home meds include invega and plavix; continue  Chronic  systolic heart failure.     - Euvolemic     - he was on losartan in the past; held d/t renal function     - Daily weights, I&O; monitor  Acute kidney injury.       - Creatinine 1.38.      - 3/23: hold nephrotoxins, it is improved today, monitor  DVT prophylaxis: lovnoex Code Status: FULL Family Communication: Spoke with dtr [name] [phone]) by phone and updated with plan.   Disposition Plan: Still working on BP. Home with dtr when better.   ROS:  Denies CP, N, V . Remainder 10-pt ROS is negative for all not previously mentioned.  Subjective: "I want to get better."  Objective: Vitals:   02/08/20 0609 02/08/20 0751 02/08/20 0950 02/08/20 1122  BP: (!) 154/105 (!) 170/101 (!) 139/100 (!) 156/114  Pulse: (!) 104   96  Resp:  18 18 18   Temp:  98 F (36.7 C) 98 F (36.7 C) 98.2 F (36.8 C)  TempSrc:  Oral Oral Oral  SpO2:  100% 100% 100%  Weight:      Height:        Intake/Output Summary (Last 24  hours) at 02/08/2020 1417 Last data filed at 02/08/2020 8841 Gross per 24 hour  Intake 1789.11 ml  Output 1675 ml  Net 114.11 ml   Filed Weights   02/06/20 1959  Weight: 95.3 kg    Examination:  General: 59 y.o. male resting in bed in NAD Cardiovascular: RRR, +S1, S2, no m/g/r Respiratory: CTABL, no w/r/r, normal WOB GI: BS+, NDNT, no masses noted, no organomegaly noted MSK: No e/c/c Neuro: alert to name, follows commands  Data Reviewed: I have personally reviewed following labs and imaging studies.  CBC: Recent Labs  Lab 02/06/20 2019 02/07/20 0435  WBC 8.8 7.5  NEUTROABS 4.9  --   HGB 13.1 11.7*  HCT 41.1 37.5*  MCV 85.8 86.6  PLT 242 660   Basic Metabolic Panel: Recent Labs  Lab 02/06/20 2019 02/07/20 0435 02/08/20 0429  NA 134*  --  137  K 3.9  --  3.9  CL 97*  --  103  CO2 23  --  25  GLUCOSE 320*  --  194*  BUN 19  --  19  CREATININE 1.23 1.38* 1.11  CALCIUM 9.3  --  8.8*   GFR: Estimated Creatinine Clearance: 86.5 mL/min (by C-G formula based on SCr of 1.11 mg/dL). Liver Function Tests: Recent Labs  Lab 02/06/20 2019  AST 19  ALT 10  ALKPHOS 90  BILITOT 0.8  PROT 7.3  ALBUMIN 3.7   No results for input(s): LIPASE, AMYLASE in the last 168 hours. No results for input(s): AMMONIA in the last 168 hours. Coagulation Profile: No results for input(s): INR, PROTIME in the last 168 hours. Cardiac Enzymes: No results for input(s): CKTOTAL, CKMB, CKMBINDEX, TROPONINI in the last 168 hours. BNP (last 3 results) No results for input(s): PROBNP in the last 8760 hours. HbA1C: Recent Labs    02/07/20 0435  HGBA1C 7.8*   CBG: Recent Labs  Lab 02/07/20 1211 02/07/20 1612 02/07/20 2118 02/08/20 0608 02/08/20 1118  GLUCAP 282* 188* 179* 203* 204*   Lipid Profile: No results for input(s): CHOL, HDL, LDLCALC, TRIG, CHOLHDL, LDLDIRECT in the last 72 hours. Thyroid Function Tests: No results for input(s): TSH, T4TOTAL, FREET4, T3FREE, THYROIDAB  in the last 72 hours. Anemia Panel: No results for input(s): VITAMINB12, FOLATE, FERRITIN, TIBC, IRON, RETICCTPCT in the last 72  hours. Sepsis Labs: No results for input(s): PROCALCITON, LATICACIDVEN in the last 168 hours.  Recent Results (from the past 240 hour(s))  SARS CORONAVIRUS 2 (TAT 6-24 HRS) Nasopharyngeal Nasopharyngeal Swab     Status: None   Collection Time: 02/07/20  4:31 AM   Specimen: Nasopharyngeal Swab  Result Value Ref Range Status   SARS Coronavirus 2 NEGATIVE NEGATIVE Final    Comment: (NOTE) SARS-CoV-2 target nucleic acids are NOT DETECTED. The SARS-CoV-2 RNA is generally detectable in upper and lower respiratory specimens during the acute phase of infection. Negative results do not preclude SARS-CoV-2 infection, do not rule out co-infections with other pathogens, and should not be used as the sole basis for treatment or other patient management decisions. Negative results must be combined with clinical observations, patient history, and epidemiological information. The expected result is Negative. Fact Sheet for Patients: SugarRoll.be Fact Sheet for Healthcare Providers: https://www.woods-mathews.com/ This test is not yet approved or cleared by the [place] FDA and  has been authorized for detection and/or diagnosis of SARS-CoV-2 by FDA under an Emergency Use Authorization (EUA). This EUA will remain  in effect (meaning this test can be used) for the duration of the COVID-19 declaration under Section 56 4(b)(1) of the Act, 21 U.S.C. section 360bbb-3(b)(1), unless the authorization is terminated or revoked sooner. Performed at [facility], [place] [address] [place], [place] [address]       Radiology Studies: DG CHEST PORT 1 VIEW  Result Date: 02/07/2020 CLINICAL DATA:  Shortness of breath EXAM: PORTABLE CHEST 1 VIEW COMPARISON:  Six days ago FINDINGS: Cardiomegaly and aortic tortuosity versus dilatation.  Mediastinal contours are accentuated by the lower volumes when compared to prior There is no edema, consolidation, effusion, or pneumothorax. IMPRESSION: 1. Cardiomegaly with aortic tortuosity or dilatation. 2. Clear lungs. Electronically Signed   By: Monte Fantasia M.D.   On: 02/07/2020 06:50   DG Hip Unilat W or Wo Pelvis 2-3 Views Left  Result Date: 02/06/2020 CLINICAL DATA:  Chronic low back pain radiating to left leg EXAM: DG HIP (WITH OR WITHOUT PELVIS) 2-3V LEFT COMPARISON:  None. FINDINGS: Hip joints and SI joints symmetric and unremarkable. No acute bony abnormality. Specifically, no fracture, subluxation, or dislocation. IMPRESSION: Negative. Electronically Signed   By: Rolm Baptise M.D.   On: 02/06/2020 23:48     Scheduled Meds: . amLODipine  5 mg Oral Daily  . clopidogrel  75 mg Oral Daily  . diclofenac Sodium  2 g Topical QID  . enoxaparin (LOVENOX) injection  40 mg Subcutaneous Q24H  . gabapentin  300 mg Oral TID  . hydrALAZINE  10 mg Oral Q8H  . insulin aspart  0-15 Units Subcutaneous TID WC  . insulin aspart  0-5 Units Subcutaneous QHS  . labetalol  200 mg Oral BID  . paliperidone  3 mg Oral Daily   Continuous Infusions:   LOS: 1 day    Time spent: 25 minutes spent in the in the coordination of care today.    Jonnie Finner, DO [facility]  If 7PM-7AM, please contact night-coverage [email or web address] 02/08/2020, 2:17 PM

## 2020-02-09 DIAGNOSIS — I110 Hypertensive heart disease with heart failure: Secondary | ICD-10-CM

## 2020-02-09 DIAGNOSIS — I679 Cerebrovascular disease, unspecified: Secondary | ICD-10-CM

## 2020-02-09 DIAGNOSIS — I5020 Unspecified systolic (congestive) heart failure: Secondary | ICD-10-CM

## 2020-02-09 LAB — CBC WITH DIFFERENTIAL/PLATELET
Abs Immature Granulocytes: 0.04 10*3/uL (ref 0.00–0.07)
Basophils Absolute: 0 10*3/uL (ref 0.0–0.1)
Basophils Relative: 1 %
Eosinophils Absolute: 0.2 10*3/uL (ref 0.0–0.5)
Eosinophils Relative: 2 %
HCT: 38.6 % — ABNORMAL LOW (ref 39.0–52.0)
Hemoglobin: 12.2 g/dL — ABNORMAL LOW (ref 13.0–17.0)
Immature Granulocytes: 1 %
Lymphocytes Relative: 21 %
Lymphs Abs: 1.8 10*3/uL (ref 0.7–4.0)
MCH: 26.9 pg (ref 26.0–34.0)
MCHC: 31.6 g/dL (ref 30.0–36.0)
MCV: 85.2 fL (ref 80.0–100.0)
Monocytes Absolute: 0.9 10*3/uL (ref 0.1–1.0)
Monocytes Relative: 11 %
Neutro Abs: 5.4 10*3/uL (ref 1.7–7.7)
Neutrophils Relative %: 64 %
Platelets: 240 10*3/uL (ref 150–400)
RBC: 4.53 MIL/uL (ref 4.22–5.81)
RDW: 14.5 % (ref 11.5–15.5)
WBC: 8.4 10*3/uL (ref 4.0–10.5)
nRBC: 0 % (ref 0.0–0.2)

## 2020-02-09 LAB — COMPREHENSIVE METABOLIC PANEL
ALT: 8 U/L (ref 0–44)
AST: 15 U/L (ref 15–41)
Albumin: 3.4 g/dL — ABNORMAL LOW (ref 3.5–5.0)
Alkaline Phosphatase: 70 U/L (ref 38–126)
Anion gap: 11 (ref 5–15)
BUN: 9 mg/dL (ref 6–20)
CO2: 24 mmol/L (ref 22–32)
Calcium: 8.9 mg/dL (ref 8.9–10.3)
Chloride: 102 mmol/L (ref 98–111)
Creatinine, Ser: 1.15 mg/dL (ref 0.61–1.24)
GFR calc Af Amer: >60 mL/min (ref >60)
GFR calc non Af Amer: >60 mL/min (ref >60)
Glucose, Bld: 194 mg/dL — ABNORMAL HIGH (ref 70–99)
Potassium: 3.8 mmol/L (ref 3.5–5.1)
Sodium: 137 mmol/L (ref 135–145)
Total Bilirubin: 0.4 mg/dL (ref 0.3–1.2)
Total Protein: 6.7 g/dL (ref 6.5–8.1)

## 2020-02-09 LAB — GLUCOSE, CAPILLARY
Glucose-Capillary: 229 mg/dL — ABNORMAL HIGH (ref 70–99)
Glucose-Capillary: 159 mg/dL — ABNORMAL HIGH (ref 70–99)
Glucose-Capillary: 174 mg/dL — ABNORMAL HIGH (ref 70–99)
Glucose-Capillary: 155 mg/dL — ABNORMAL HIGH (ref 70–99)

## 2020-02-09 LAB — MAGNESIUM: Magnesium: 2.1 mg/dL (ref 1.7–2.4)

## 2020-02-09 LAB — PHOSPHORUS: Phosphorus: 2.9 mg/dL (ref 2.5–4.6)

## 2020-02-09 MED ORDER — LOSARTAN POTASSIUM 25 MG PO TABS
25.0000 mg | ORAL_TABLET | Freq: Every day | ORAL | Status: DC
  Administered 2020-02-09: 25 mg via ORAL
  Filled 2020-02-09: qty 1

## 2020-02-09 MED ORDER — LISINOPRIL 10 MG PO TABS
10.0000 mg | ORAL_TABLET | Freq: Every day | ORAL | Status: DC
  Filled 2020-02-09: qty 1

## 2020-02-09 MED ORDER — AMLODIPINE BESYLATE 10 MG PO TABS
10.0000 mg | ORAL_TABLET | Freq: Every day | ORAL | Status: DC
  Administered 2020-02-09 – 2020-02-10 (×2): 10 mg via ORAL
  Filled 2020-02-09 (×2): qty 1

## 2020-02-09 MED ORDER — CARVEDILOL 25 MG PO TABS
25.0000 mg | ORAL_TABLET | Freq: Two times a day (BID) | ORAL | Status: DC
  Administered 2020-02-09 – 2020-02-10 (×4): 25 mg via ORAL
  Filled 2020-02-09 (×4): qty 1

## 2020-02-09 NOTE — TOC Initial Note (Signed)
Transition of Care [facility]) - Initial/Assessment Note    Patient Details  Name: [name] MRN: [id number] Date of Birth: 1961-05-15  Transition of Care [facility]) CM/SW Contact:    Marilu Favre, RN Phone Number: 02/09/2020, 1:52 PM  Clinical Narrative:                  Spoke to patient at bedside and with permission called daughter [name] [phone]. Placed [name] on speaker phone. Patient does not have a legal guardian. Confirmed face sheet information. Patient lives at home with sister [name].   Explained to patient and daughter [name]  , PT recommended HHPT. NCM exhausted Medicare.gov list for home health agencies, no agency able to accept due to insurance/ staffing.  Patient and [name] interested in OP PT at [facility] location. [name] can provide transportation. Order placed messaged MD for signature.   Patient needs walker at home ordered with Zack with [facility]. Expected Discharge Plan: Home/Self Care Barriers to Discharge: Continued Medical Work up   Patient Goals and CMS Choice Patient states their goals for this hospitalization and ongoing recovery are:: to return to home CMS Medicare.gov Compare Post Acute Care list provided to:: Patient Choice offered to / list presented to : Patient  Expected Discharge Plan and Services Expected Discharge Plan: Home/Self Care   Discharge Planning Services: CM Consult Post Acute Care Choice: [place] arrangements for the past 2 months: Single Family Home                 DME Arranged: Walker rolling DME Agency: [employer] Date DME Agency Contacted: 02/09/20 Time DME Agency Contacted: [id number] Representative spoke with at DME Agency: [place] Arrangements/Services Living arrangements for the past 2 months: [place] with:: Siblings Patient language and need for interpreter reviewed:: Yes Do you feel safe going back to the place where you live?: Yes      Need for  Family Participation in Patient Care: Yes (Comment) Care giver support system in place?: Yes (comment)   Criminal Activity/Legal Involvement Pertinent to Current Situation/Hospitalization: No - Comment as needed  Activities of Daily Living Home Assistive Devices/Equipment: Walker (specify type) ADL Screening (condition at time of admission) Patient's cognitive ability adequate to safely complete daily activities?: Yes Is the patient deaf or have difficulty hearing?: No Does the patient have difficulty seeing, even when wearing glasses/contacts?: No Does the patient have difficulty concentrating, remembering, or making decisions?: No Patient able to express need for assistance with ADLs?: Yes Does the patient have difficulty dressing or bathing?: No Independently performs ADLs?: Yes (appropriate for developmental age) Does the patient have difficulty walking or climbing stairs?: Yes Weakness of Legs: Both Weakness of Arms/Hands: None  Permission Sought/Granted   Permission granted to share information with : Yes, Verbal Permission Granted  Share Information with NAME: Daughter [name] [phone]           Emotional Assessment Appearance:: Appears stated age Attitude/Demeanor/Rapport: Engaged Affect (typically observed): Accepting Orientation: : Oriented to Self, Oriented to Place, Oriented to  Time, Oriented to Situation Alcohol / Substance Use: Not Applicable Psych Involvement: No (comment)  Admission diagnosis:  SOB (shortness of breath) [R06.02] Left hip pain [M25.552] Hypertensive urgency [I16.0] Patient Active Problem List   Diagnosis Date Noted  . Abnormal EKG 02/07/2020  . Left hip pain 02/07/2020  . AKI (acute kidney injury) ([place]) 02/07/2020  . Systolic heart  failure secondary to hypertension ([place])   . Schizophrenia ([place])   . Acute systolic heart failure ([place]) 02/02/2018  . Chest pain 01/31/2018  . Hypokalemia   . Schizoaffective disorder, bipolar type ([place])  11/22/2017  . Trauma   . Tachycardia 03/03/2017  . Alcohol abuse   . Cocaine abuse ([place])   . Hypertensive urgency   . Tachycardia   . Cocaine abuse with cocaine-induced mood disorder ([place]) 12/05/2016  . Cerebrovascular disease 09/02/2015  . 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 Malignant hypertension 08/30/2015  . Alcohol use disorder, severe, dependence ([place]) 08/30/2015  . Alcohol withdrawal ([place]) 08/30/2015  . Alcohol abuse with alcohol-induced mood disorder ([place]) 08/30/2015  . Thrombocytopenia ([place]) 08/27/2015  . Tobacco use disorder 08/26/2015  . Hyperlipidemia 04/28/2015  . Schizoaffective disorder, depressive type ([place])   . Gout 08/09/2013  . Diabetes mellitus ([place]) 07/09/2012   PCP:  [facility] Pharmacy:   [id number] - [place], [place] [address] Phone: [phone] Fax: [id number] - [place], [place] - [address] AT [place] [address] [place] [address] Phone: [phone] Fax: [phone]     Social Determinants of Health (SDOH) Interventions    Readmission Risk Interventions No flowsheet data found.

## 2020-02-09 NOTE — Progress Notes (Signed)
PROGRESS NOTE    [name]  IWP:[id number] DOB: 27-Dec-1960 DOA: 02/06/2020 PCP: [facility]  Brief Narrative:  HPI per Dr. Gean Birchwood on 02/07/20 [name] is a 59 y.o. male with history of hypertension diabetes mellitus polysubstance abuse presents to the ER because of increasing pain in the left hip area.  Patient has been having this pain over the last few weeks and I also had followed up with his primary care physician during his second week of March about a week ago.  Denies any trauma or fall.  Denies any fever chills.  Patient states he at times gets short of breath but presently not short of breath but denies any chest pain.  Not sure if he is compliant with medication patient is also a poor historian.  ED Course: In the ER x-rays show nothing acute in the left hip.  Left knee does not show any obvious effusion.  Labs show CBC unremarkable blood glucose 320 sodium 134 urine shows proteinuria with glucose.  Covid test is pending.  Chest x-ray is pending.  EKG shows normal sinus rhythm with an lateral leads showing some T wave inversions.  Patient was found to have very elevated blood pressure with systolic in the 505 and diastolic in the 397Q despite giving multiple doses of antihypertensives.  Patient admitted for further observation.  **Interim History Pressures remained relatively uncontrolled so adjusted medications and started him on an ARB as well as increased amlodipine.  Changed labetalol to carvedilol.  Still complaining of some left hip pain but is improving.  Assessment & Plan:   Principal Problem:   Hypertensive urgency Active Problems:   Diabetes mellitus ([place])   Cerebrovascular disease   Schizoaffective disorder, bipolar type ([place])   Systolic heart failure secondary to hypertension (H[facility])   Abnormal EKG   Left hip pain   AKI (acute kidney injury) ([place])  Hypertensive urgency. HTN   -Likely related to noncompliance.    -He was recently in a group home that discharged him; he daughter is now trying to help out. -Chart review indicates he was seen by PCP 10 days ago and at that time he had run out of medications.  The plan according to that note was to stop clonidine and start labetalol with hydralazine but I have stopped his labetalol and put him back on carvedilol given that he is on this per cardiology given his CHF -Added back his ARB as well as increase his dose of Norvasc to 10 mg -Continue with hydralazine 10 mg p.o. every 8 hours -Also has IV hydralazine 10 mg every 4 hours for systolic blood pressure greater than 160 -We will need close PCP follow-up  DM2 w/o long term insulin use   -Serum glucose 320 on admission.   -Home medications include Metformin.   -Hemoglobin A1c 7.8. -Sliding scale insulin for optimal control will increase to moderate and add HS coverage -At Discharge, he can resume his metformin and will need close follow up  -CBGs have been ranging from 159-229  Abnormal EKG   -Patient denies chest pain.   -High Sensitivity troponin slightly elevated but flat.   -EKG Sinus rhythm Right axis deviation Abnormal R-wave progression, late transition - Abnormal T When compared with ECG of EARLIER SAME DATE T wave inversion Lateral leads is more.   -Chest x-ray reveals cardiomegaly with aortic tortuosity or dilation and clear lungs. -Repeat EKG in AM   Left hip pain; chronic.   -Patient states it has  been worse over the last couple of days. -Denies any recent fall but has fallen previously . -X-ray of that hip is negative for any bony abnormality specifically no fracture or dislocation. -C/w Voltaren gel -PT/OT recommending Home Health but patient will receive OP PT  Bipolar disorder/CVA.   -Stable at baseline.   -Daughter confirms patient is at his baseline with regards to his dysarthria. -Home meds include invega and Clopidogrel 75 mg po Daily; continue  Chronic systolic heart  failure. -Appears Euvolemic -he was on losartan in the past is initially held due to his renal function but I resumed this morning -Daily weights, I&O -Patient is -1.015 L since admission  Acute kidney injury.  Improved -Creatinine was 1.38 the day before yesterday  -Initially held his nephrotoxic medication but restarted to his ARB at 25 mg of losartan -Continue to avoid further nephrotoxic medications, contrast dyes, hypotension and renally adjust medications -Repeat CMP in a.m.  Normocytic Anemia -Patient's hemoglobin/hematocrit is now 12.2/30.6 -Monitor for signs and symptoms of bleeding -Currently no overt bleeding noted -Repeat CBC in a.m.   DVT prophylaxis: Enoxaparin 40 mg subcu every 24 Code Status: FULL CODE  Family Communication: No family present at bedside Disposition Plan: Patient is from a group home who presented with hypertensive urgency and his blood pressure is slowly improving and likely can be discharged in the next 24 to 48 hours and will need outpatient PT and OT given that he he cannot get home health PT.  Consultants:   None   Procedures: None  Antimicrobials:  Anti-infectives (From admission, onward)   None     Subjective: Seen and examined at bedside and he is feeling good but does also complain of some left hip pain.  Denies any nausea, vomiting or lightheadedness or dizziness.  Denies any chest pain.  No other concerns or plans at this time.  Objective: Vitals:   02/09/20 0609 02/09/20 0822 02/09/20 1248 02/09/20 1804  BP: (!) 178/119 (!) 173/112 (!) 147/98 (!) 150/95  Pulse: 100 (!) 103 86 84  Resp: 17  18 16   Temp: 97.9 F (36.6 C)  97.7 F (36.5 C) 98 F (36.7 C)  TempSrc: Oral  Oral Oral  SpO2: 98%  98% 100%  Weight:      Height:        Intake/Output Summary (Last 24 hours) at 02/09/2020 2134 Last data filed at 02/09/2020 0600 Gross per 24 hour  Intake 240 ml  Output 900 ml  Net -660 ml   Filed Weights   02/06/20 1959   Weight: 95.3 kg   Examination: Physical Exam:  Constitutional: WN/WD overweight African-American male currently appears in NAD and appears calm and comfortable Eyes: Lids and conjunctivae normal, sclerae anicteric  ENMT: External Ears, Nose appear normal. Grossly normal hearing.  Neck: Appears normal, supple, no cervical masses, normal ROM, no appreciable thyromegaly; no JVD Respiratory: Diminished to auscultation bilaterally, no wheezing, rales, rhonchi or crackles. Normal respiratory effort and patient is not tachypenic. No accessory muscle use.  Unlabored breathing Cardiovascular: RRR, no murmurs / rubs / gallops. S1 and S2 auscultated. No extremity edema.  Abdomen: Soft, non-tender, distended slightly secondary body habitus.  Bowel sounds positive.  GU: Deferred. Musculoskeletal: No clubbing / cyanosis of digits/nails. No joint deformity upper and lower extremities.  Skin: No rashes, lesions, ulcers limited skin evaluation. No induration; Warm and dry.  Neurologic: CN 2-12 grossly intact with no focal deficits he is dysarthric from a prior CVA.  Romberg sign and  cerebellar reflexes not assessed.  Psychiatric: Normal judgment and insight. Alert and oriented x 3. Normal mood and appropriate affect.   Data Reviewed: I have personally reviewed following labs and imaging studies  CBC: Recent Labs  Lab 02/06/20 2019 02/07/20 0435 02/09/20 1014  WBC 8.8 7.5 8.4  NEUTROABS 4.9  --  5.4  HGB 13.1 11.7* 12.2*  HCT 41.1 37.5* 38.6*  MCV 85.8 86.6 85.2  PLT 242 231 536   Basic Metabolic Panel: Recent Labs  Lab 02/06/20 2019 02/07/20 0435 02/08/20 0429 02/09/20 1014  NA 134*  --  137 137  K 3.9  --  3.9 3.8  CL 97*  --  103 102  CO2 23  --  25 24  GLUCOSE 320*  --  194* 194*  BUN 19  --  19 9  CREATININE 1.23 1.38* 1.11 1.15  CALCIUM 9.3  --  8.8* 8.9  MG  --   --   --  2.1  PHOS  --   --   --  2.9   GFR: Estimated Creatinine Clearance: 83.5 mL/min (by C-G formula based on  SCr of 1.15 mg/dL). Liver Function Tests: Recent Labs  Lab 02/06/20 2019 02/09/20 1014  AST 19 15  ALT 10 8  ALKPHOS 90 70  BILITOT 0.8 0.4  PROT 7.3 6.7  ALBUMIN 3.7 3.4*   No results for input(s): LIPASE, AMYLASE in the last 168 hours. No results for input(s): AMMONIA in the last 168 hours. Coagulation Profile: No results for input(s): INR, PROTIME in the last 168 hours. Cardiac Enzymes: No results for input(s): CKTOTAL, CKMB, CKMBINDEX, TROPONINI in the last 168 hours. BNP (last 3 results) No results for input(s): PROBNP in the last 8760 hours. HbA1C: Recent Labs    02/07/20 0435  HGBA1C 7.8*   CBG: Recent Labs  Lab 02/08/20 1618 02/08/20 2141 02/09/20 0611 02/09/20 1120 02/09/20 1559  GLUCAP 198* 220* 229* 159* 174*   Lipid Profile: No results for input(s): CHOL, HDL, LDLCALC, TRIG, CHOLHDL, LDLDIRECT in the last 72 hours. Thyroid Function Tests: No results for input(s): TSH, T4TOTAL, FREET4, T3FREE, THYROIDAB in the last 72 hours. Anemia Panel: No results for input(s): VITAMINB12, FOLATE, FERRITIN, TIBC, IRON, RETICCTPCT in the last 72 hours. Sepsis Labs: No results for input(s): PROCALCITON, LATICACIDVEN in the last 168 hours.  Recent Results (from the past 240 hour(s))  SARS CORONAVIRUS 2 (TAT 6-24 HRS) Nasopharyngeal Nasopharyngeal Swab     Status: None   Collection Time: 02/07/20  4:31 AM   Specimen: Nasopharyngeal Swab  Result Value Ref Range Status   SARS Coronavirus 2 NEGATIVE NEGATIVE Final    Comment: (NOTE) SARS-CoV-2 target nucleic acids are NOT DETECTED. The SARS-CoV-2 RNA is generally detectable in upper and lower respiratory specimens during the acute phase of infection. Negative results do not preclude SARS-CoV-2 infection, do not rule out co-infections with other pathogens, and should not be used as the sole basis for treatment or other patient management decisions. Negative results must be combined with clinical observations, patient  history, and epidemiological information. The expected result is Negative. Fact Sheet for Patients: SugarRoll.be Fact Sheet for Healthcare Providers: https://www.woods-mathews.com/ This test is not yet approved or cleared by the [place] FDA and  has been authorized for detection and/or diagnosis of SARS-CoV-2 by FDA under an Emergency Use Authorization (EUA). This EUA will remain  in effect (meaning this test can be used) for the duration of the COVID-19 declaration under Section 56 4(b)(1) of the Act,  21 U.S.C. section 360bbb-3(b)(1), unless the authorization is terminated or revoked sooner. Performed at [facility], [place] [address], [place], [place] [address]      RN Pressure Injury Documentation:     Estimated body mass index is 29.29 kg/m as calculated from the following:   Height as of this encounter: 5\' 11"  (1.803 m).   Weight as of this encounter: 95.3 kg.  Malnutrition Type:      Malnutrition Characteristics:      Nutrition Interventions:      Radiology Studies: No results found.  Scheduled Meds: . amLODipine  10 mg Oral Daily  . carvedilol  25 mg Oral BID WC  . clopidogrel  75 mg Oral Daily  . diclofenac Sodium  2 g Topical QID  . enoxaparin (LOVENOX) injection  40 mg Subcutaneous Q24H  . gabapentin  300 mg Oral TID  . hydrALAZINE  10 mg Oral Q8H  . insulin aspart  0-15 Units Subcutaneous TID WC  . insulin aspart  0-5 Units Subcutaneous QHS  . losartan  25 mg Oral Daily  . paliperidone  3 mg Oral Daily   Continuous Infusions:   LOS: 2 days   Kerney Elbe, DO [facility] PAGER is on AMION  If 7PM-7AM, please contact night-coverage [email or web address]

## 2020-02-10 LAB — COMPREHENSIVE METABOLIC PANEL
ALT: 7 U/L (ref 0–44)
AST: 14 U/L — ABNORMAL LOW (ref 15–41)
Albumin: 3.3 g/dL — ABNORMAL LOW (ref 3.5–5.0)
Alkaline Phosphatase: 73 U/L (ref 38–126)
Anion gap: 10 (ref 5–15)
BUN: 11 mg/dL (ref 6–20)
CO2: 25 mmol/L (ref 22–32)
Calcium: 9 mg/dL (ref 8.9–10.3)
Chloride: 104 mmol/L (ref 98–111)
Creatinine, Ser: 1.25 mg/dL — ABNORMAL HIGH (ref 0.61–1.24)
GFR calc Af Amer: >60 mL/min (ref >60)
GFR calc non Af Amer: >60 mL/min (ref >60)
Glucose, Bld: 202 mg/dL — ABNORMAL HIGH (ref 70–99)
Potassium: 3.5 mmol/L (ref 3.5–5.1)
Sodium: 139 mmol/L (ref 135–145)
Total Bilirubin: 0.4 mg/dL (ref 0.3–1.2)
Total Protein: 7 g/dL (ref 6.5–8.1)

## 2020-02-10 LAB — MAGNESIUM: Magnesium: 1.9 mg/dL (ref 1.7–2.4)

## 2020-02-10 LAB — GLUCOSE, CAPILLARY
Glucose-Capillary: 223 mg/dL — ABNORMAL HIGH (ref 70–99)
Glucose-Capillary: 193 mg/dL — ABNORMAL HIGH (ref 70–99)
Glucose-Capillary: 168 mg/dL — ABNORMAL HIGH (ref 70–99)

## 2020-02-10 LAB — PHOSPHORUS: Phosphorus: 3.3 mg/dL (ref 2.5–4.6)

## 2020-02-10 MED ORDER — HYDRALAZINE HCL 25 MG PO TABS
25.0000 mg | ORAL_TABLET | Freq: Three times a day (TID) | ORAL | Status: DC
  Administered 2020-02-10: 25 mg via ORAL
  Filled 2020-02-10: qty 1

## 2020-02-10 MED ORDER — HYDRALAZINE HCL 25 MG PO TABS: 25.0000 mg | ORAL_TABLET | Freq: Three times a day (TID) | ORAL | 0 refills | Status: DC

## 2020-02-10 MED ORDER — METFORMIN HCL 500 MG PO TABS: 500.0000 mg | ORAL_TABLET | Freq: Two times a day (BID) | ORAL | 0 refills | Status: DC

## 2020-02-10 MED ORDER — DICLOFENAC SODIUM 1 % EX GEL: 2.0000 g | Freq: Four times a day (QID) | CUTANEOUS | 0 refills | Status: DC

## 2020-02-10 MED ORDER — CLOPIDOGREL BISULFATE 75 MG PO TABS: 75.0000 mg | ORAL_TABLET | Freq: Every day | ORAL | 0 refills | Status: DC

## 2020-02-10 MED ORDER — AMLODIPINE BESYLATE 10 MG PO TABS: 10.0000 mg | ORAL_TABLET | Freq: Every day | ORAL | 0 refills | Status: DC

## 2020-02-10 MED ORDER — LOSARTAN POTASSIUM 50 MG PO TABS: 50.0000 mg | ORAL_TABLET | Freq: Every day | ORAL | 0 refills | Status: DC

## 2020-02-10 MED ORDER — HYDRALAZINE HCL 20 MG/ML IJ SOLN: 10.0000 mg | INTRAMUSCULAR | Status: DC | PRN

## 2020-02-10 MED ORDER — ACETAMINOPHEN 325 MG PO TABS: 650.0000 mg | ORAL_TABLET | Freq: Four times a day (QID) | ORAL | 0 refills | Status: DC | PRN

## 2020-02-10 MED ORDER — LOSARTAN POTASSIUM 50 MG PO TABS
50.0000 mg | ORAL_TABLET | Freq: Every day | ORAL | Status: DC
  Administered 2020-02-10: 50 mg via ORAL
  Filled 2020-02-10: qty 1

## 2020-02-10 MED ORDER — ONDANSETRON HCL 4 MG PO TABS: 4.0000 mg | ORAL_TABLET | Freq: Four times a day (QID) | ORAL | 0 refills | Status: DC | PRN

## 2020-02-10 MED ORDER — CARVEDILOL 25 MG PO TABS: 25.0000 mg | ORAL_TABLET | Freq: Two times a day (BID) | ORAL | 0 refills | Status: DC

## 2020-02-10 NOTE — Progress Notes (Signed)
MD placed order to d/c home, daughter on phone with pt at this time, daughter informed pt going home today. She will be coming this evening to pick pt up.

## 2020-02-10 NOTE — Progress Notes (Signed)
pts iv and tele box removed. discharge papers given to pt. Pt verbilized understanding of instructions. Pt discharged home with sister in pov in stable condition. All pts belongings taken with pt.

## 2020-02-10 NOTE — Discharge Summary (Signed)
Physician Discharge Summary  [name] YKD:[id number] DOB: 1961-11-13 DOA: 02/06/2020  PCP: [facility]  Admit date: 02/06/2020 Discharge date: 02/10/2020  Admitted From: Home Disposition: Home with outpatient PT  Recommendations for Outpatient Follow-up:  1. Follow up with PCP in 1-2 weeks 2. Follow up with Cardiology within 1-2 weeks 3. Follow up Pain Clinic in the outpatient setting  4. Please obtain CMP/CBC, Mag, Phos in one week 5. Please follow up on the following pending results:  Home Health: No Equipment/Devices: None  Discharge Condition: Stable  CODE STATUS: FULL CODE Diet recommendation: Heart Healthy Carb Modified Diet  Brief/Interim Summary: HPI per Dr. Gean Birchwood on 02/07/20 [name] McLendonis a 59 y.o.malewithhistory of hypertension diabetes mellitus polysubstance abuse presents to the ER because of increasing pain in the left hip area. Patient has been having this pain over the last few weeks and I also had followed up with his primary care physician during his second week of March about a week ago. Denies any trauma or fall. Denies any fever chills. Patient states he at times gets short of breath but presently not short of breath but denies any chest pain. Not sure if he is compliant with medication patient is also a poor historian.  ED Course:In the ER x-rays show nothing acute in the left hip. Left knee does not show any obvious effusion. Labs show CBC unremarkable blood glucose 320 sodium 134 urine shows proteinuria with glucose. Covid test is pending. Chest x-ray is pending. EKG shows normal sinus rhythm with an lateral leads showing some T wave inversions. Patient was found to have very elevated blood pressure with systolic in the 397 and diastolic in the 673A despite giving multiple doses of antihypertensives. Patient admitted for further observation.  **Interim History Pressures remained relatively  uncontrolled so adjusted medications and started him on an ARB as well as increased amlodipine.  Changed labetalol to carvedilol.  Still complaining of some left hip pain but is improving.  Spoke with Cardiology Dr. Harrell Gave who made additional recommendations over the phone about patient's blood pressure medications and he is stable for discharge at this time.  He will need to follow-up with PCP and cardiology in the outpatient setting  Discharge Diagnoses:  Principal Problem:   Hypertensive urgency Active Problems:   Diabetes mellitus ([place])   Cerebrovascular disease   Schizoaffective disorder, bipolar type ([place])   Systolic heart failure secondary to hypertension ([facility])   Abnormal EKG   Left hip pain   AKI (acute kidney injury) ([place])  Hypertensive urgency. HTN -Likely related to noncompliance.  -He was recently in a group home that discharged him; he daughter is now trying to help out. -Chart review indicates he was seen by PCP 10 days ago and at that time he had run out of medications. The plan according to that note was to stop clonidine and start labetalol with hydralazine but I have stopped his labetalol and put him back on carvedilol given that he is on this per cardiology given his CHF -Added back his ARB as well as increase his dose of Norvasc to 10 mg; ARB is now 50 mg  -Continued with hydralazine 10 mg p.o. every 8 hours but increased to 25 mg po q8h -Also has IV hydralazine 10 mg every 4 hours for systolic blood pressure greater than 160 -We will need close PCP follow-up as his BP is improved -Be discharged on amlodipine 10 mg p.o. daily, carvedilol 25 mg p.o. twice daily, hydralazine 25  mg p.o. every 8, as well as losartan 50 mg p.o. daily--follow-up with PCP and cardiology in outpatient setting his blood pressure is improved and last blood pressure prior to discharge was 148/100  DM2 w/o long term insulin use -Serum glucose 320 on admission.  -Home medications include  Metformin which will be continued .  -Hemoglobin A1c 7.8. -Sliding scale insulin for optimal control will increase to moderate and add HS coverage -At Discharge, he can resume his metformin and will need close follow up and additional Blood Sugar Control  -CBGs have been ranging from 155-193  Abnormal EKG  -Patient denies chest pain.  -High Sensitivity troponin slightly elevated but flat.  -EKG Sinus rhythm Right axis deviation Abnormal R-wave progression, late transition - Abnormal T When compared with ECG of EARLIER SAME DATE T wave inversion Lateral leads is more.  -Chest x-ray reveals cardiomegaly with aortic tortuosity or dilation and clear lungs. -Dr. Harrell Gave evaluated EKG and felt it was ok and recommended D/C and felt he had LVH from Uncontrolled HTN   Left hip pain; chronic.  -Patient states it has been worse over the last couple of days. -Denies any recent fall but has fallen previously . -X-ray of that hip is negative for any bony abnormality specifically no fracture or dislocation. -C/w Voltaren gel -PT/OT recommending Home Health but patient will receive OP PT -Follow up with Orthopedics if not improving  Bipolar disorder/CVA.  -Stable at baseline.  -Daughter confirms patient is at his baseline with regards to his dysarthria. -Home meds include invega and Clopidogrel 75 mg po Daily; continue  Chronic systolic heart failure. -Appears Euvolemic -he was on losartan in the past is initially held due to his renal function but I resumed this morning -Daily weights, I&O -Patient is -1.015 L since admission -C/w ARB and Carvedilol -Follow up with Cardiology within 1-2 weeks   Acute kidney injury.  Improved -Creatinine  slightly bumped from 9/1.15 -> 11/1.25 -Initially held his nephrotoxic medication but restarted to his ARB at 25 mg of losartan and increased to 50 mg for D/C -Continue to avoid further nephrotoxic medications, contrast dyes, hypotension and  renally adjust medications -Repeat CMP within 1 week   Normocytic Anemia -Patient's hemoglobin/hematocrit is now 12.2/30.6 yesterday  -Monitor for signs and symptoms of bleeding -Currently no overt bleeding noted -Repeat CBC within 1 week   Discharge Instructions  Discharge Instructions    Ambulatory referral to Physical Therapy   Complete by: As directed    Iontophoresis - 4 mg/ml of dexamethasone: No   T.E.N.S. Unit Evaluation and Dispense as Indicated: No   Call MD for:  difficulty breathing, headache or visual disturbances   Complete by: As directed    Call MD for:  extreme fatigue   Complete by: As directed    Call MD for:  hives   Complete by: As directed    Call MD for:  persistant dizziness or light-headedness   Complete by: As directed    Call MD for:  persistant nausea and vomiting   Complete by: As directed    Call MD for:  redness, tenderness, or signs of infection (pain, swelling, redness, odor or green/yellow discharge around incision site)   Complete by: As directed    Call MD for:  severe uncontrolled pain   Complete by: As directed    Call MD for:  temperature >100.4   Complete by: As directed    Diet - low sodium heart healthy   Complete by: As directed  Discharge instructions   Complete by: As directed    You were cared for by a hospitalist during your hospital stay. If you have any questions about your discharge medications or the care you received while you were in the hospital after you are discharged, you can call the unit and ask to speak with the hospitalist on call if the hospitalist that took care of you is not available. Once you are discharged, your primary care physician will handle any further medical issues. Please note that NO REFILLS for any discharge medications will be authorized once you are discharged, as it is imperative that you return to your primary care physician (or establish a relationship with a primary care physician if you do not  have one) for your aftercare needs so that they can reassess your need for medications and monitor your lab values.  Follow up with PCP and Cardiology as an outpatient. Take all medications as prescribed. If symptoms change or worsen please return to the ED for evaluation   Increase activity slowly   Complete by: As directed      Allergies as of 02/10/2020   No Known Allergies     Medication List    TAKE these medications   acetaminophen 325 MG tablet Commonly known as: TYLENOL Take 2 tablets (650 mg total) by mouth every 6 (six) hours as needed for mild pain (or Fever >/= 101).   amLODipine 10 MG tablet Commonly known as: NORVASC Take 1 tablet (10 mg total) by mouth daily. Start taking on: February 11, 2020   carvedilol 25 MG tablet Commonly known as: COREG Take 1 tablet (25 mg total) by mouth 2 (two) times daily with a meal.   clopidogrel 75 MG tablet Commonly known as: PLAVIX Take 1 tablet (75 mg total) by mouth daily. Start taking on: February 11, 2020   diclofenac Sodium 1 % Gel Commonly known as: VOLTAREN Apply 2 g topically 4 (four) times daily.   DULoxetine 30 MG capsule Commonly known as: CYMBALTA Take 1 capsule (30 mg total) by mouth 2 (two) times daily.   gabapentin 300 MG capsule Commonly known as: NEURONTIN Take 1 capsule (300 mg total) by mouth 3 (three) times daily.   hydrALAZINE 25 MG tablet Commonly known as: APRESOLINE Take 1 tablet (25 mg total) by mouth 3 (three) times daily.   losartan 50 MG tablet Commonly known as: COZAAR Take 1 tablet (50 mg total) by mouth daily. Start taking on: February 11, 2020   metFORMIN 500 MG tablet Commonly known as: GLUCOPHAGE Take 1 tablet (500 mg total) by mouth 2 (two) times daily with a meal.   ondansetron 4 MG tablet Commonly known as: ZOFRAN Take 1 tablet (4 mg total) by mouth every 6 (six) hours as needed for nausea.   paliperidone 3 MG 24 hr tablet Commonly known as: INVEGA Take 1 tablet (3 mg total) by  mouth daily.            Durable Medical Equipment  (From admission, onward)         Start     Ordered   02/09/20 1348  For home use only DME Walker rolling  Once    Question Answer Comment  Walker: With 5 Inch Wheels   Patient needs a walker to treat with the following condition Weakness      02/09/20 1348         Follow-up [facility]. Schedule an appointment as  soon as possible for a visit.   Specialty: Rehabilitation Contact information: [place] [id number] [place] [phone]         No Known Allergies  Consultations:  None but discussed with Cardiology via Telephone  Procedures/Studies: DG CHEST PORT 1 VIEW  Result Date: 02/07/2020 CLINICAL DATA:  Shortness of breath EXAM: PORTABLE CHEST 1 VIEW COMPARISON:  Six days ago FINDINGS: Cardiomegaly and aortic tortuosity versus dilatation. Mediastinal contours are accentuated by the lower volumes when compared to prior There is no edema, consolidation, effusion, or pneumothorax. IMPRESSION: 1. Cardiomegaly with aortic tortuosity or dilatation. 2. Clear lungs. Electronically Signed   By: Monte Fantasia M.D.   On: 02/07/2020 06:50   DG Hip Unilat W or Wo Pelvis 2-3 Views Left  Result Date: 02/06/2020 CLINICAL DATA:  Chronic low back pain radiating to left leg EXAM: DG HIP (WITH OR WITHOUT PELVIS) 2-3V LEFT COMPARISON:  None. FINDINGS: Hip joints and SI joints symmetric and unremarkable. No acute bony abnormality. Specifically, no fracture, subluxation, or dislocation. IMPRESSION: Negative. Electronically Signed   By: Rolm Baptise M.D.   On: 02/06/2020 23:48     Subjective: Seen and examined at bedside and patient was doing well.  Still complained of some left hip pain but felt good no nausea or vomiting.  No lightheadedness or dizziness.  No other concerns or complaints this time is ready to be discharged  home.  Discharge Exam: Vitals:   02/10/20 1449 02/10/20 1904  BP: (!) 158/102 (!) 148/100  Pulse: 90 89  Resp:  20  Temp:  98.5 F (36.9 C)  SpO2:  100%   Vitals:   02/10/20 1010 02/10/20 1100 02/10/20 1449 02/10/20 1904  BP: (!) 147/104 (!) 161/111 (!) 158/102 (!) 148/100  Pulse: 90 90 90 89  Resp: 18   20  Temp: 98.2 F (36.8 C)   98.5 F (36.9 C)  TempSrc: Oral   Oral  SpO2: 100%   100%  Weight:      Height:       General: Pt is alert, awake, not in acute distress Cardiovascular: RRR, S1/S2 +, no rubs, no gallops Respiratory: Diminished bilaterally, no wheezing, no rhonchi Abdominal: Soft, NT, distended secondary body habitus, bowel sounds + Extremities: no edema, no cyanosis  The results of significant diagnostics from this hospitalization (including imaging, microbiology, ancillary and laboratory) are listed below for reference.    Microbiology: Recent Results (from the past 240 hour(s))  SARS CORONAVIRUS 2 (TAT 6-24 HRS) Nasopharyngeal Nasopharyngeal Swab     Status: None   Collection Time: 02/07/20  4:31 AM   Specimen: Nasopharyngeal Swab  Result Value Ref Range Status   SARS Coronavirus 2 NEGATIVE NEGATIVE Final    Comment: (NOTE) SARS-CoV-2 target nucleic acids are NOT DETECTED. The SARS-CoV-2 RNA is generally detectable in upper and lower respiratory specimens during the acute phase of infection. Negative results do not preclude SARS-CoV-2 infection, do not rule out co-infections with other pathogens, and should not be used as the sole basis for treatment or other patient management decisions. Negative results must be combined with clinical observations, patient history, and epidemiological information. The expected result is Negative. Fact Sheet for Patients: SugarRoll.be Fact Sheet for Healthcare Providers: https://www.woods-mathews.com/ This test is not yet approved or cleared by the [place] FDA and  has  been authorized for detection and/or diagnosis of SARS-CoV-2 by FDA under an Emergency Use Authorization (EUA). This EUA will remain  in  effect (meaning this test can be used) for the duration of the COVID-19 declaration under Section 56 4(b)(1) of the Act, 21 U.S.C. section 360bbb-3(b)(1), unless the authorization is terminated or revoked sooner. Performed at [facility], [place] [address], [place], [place] [address]     Labs: BNP (last 3 results) No results for input(s): BNP in the last 8760 hours. Basic Metabolic Panel: Recent Labs  Lab 02/06/20 2019 02/07/20 0435 02/08/20 0429 02/09/20 1014 02/10/20 0548  NA 134*  --  137 137 139  K 3.9  --  3.9 3.8 3.5  CL 97*  --  103 102 104  CO2 23  --  25 24 25   GLUCOSE 320*  --  194* 194* 202*  BUN 19  --  19 9 11   CREATININE 1.23 1.38* 1.11 1.15 1.25*  CALCIUM 9.3  --  8.8* 8.9 9.0  MG  --   --   --  2.1 1.9  PHOS  --   --   --  2.9 3.3   Liver Function Tests: Recent Labs  Lab 02/06/20 2019 02/09/20 1014 02/10/20 0548  AST 19 15 14*  ALT 10 8 7   ALKPHOS 90 70 73  BILITOT 0.8 0.4 0.4  PROT 7.3 6.7 7.0  ALBUMIN 3.7 3.4* 3.3*   No results for input(s): LIPASE, AMYLASE in the last 168 hours. No results for input(s): AMMONIA in the last 168 hours. CBC: Recent Labs  Lab 02/06/20 2019 02/07/20 0435 02/09/20 1014  WBC 8.8 7.5 8.4  NEUTROABS 4.9  --  5.4  HGB 13.1 11.7* 12.2*  HCT 41.1 37.5* 38.6*  MCV 85.8 86.6 85.2  PLT 242 231 240   Cardiac Enzymes: No results for input(s): CKTOTAL, CKMB, CKMBINDEX, TROPONINI in the last 168 hours. BNP: Invalid input(s): POCBNP CBG: Recent Labs  Lab 02/09/20 1559 02/09/20 2140 02/10/20 0623 02/10/20 1114 02/10/20 1636  GLUCAP 174* 155* 223* 193* 168*   D-Dimer No results for input(s): DDIMER in the last 72 hours. Hgb A1c No results for input(s): HGBA1C in the last 72 hours. Lipid Profile No results for input(s): CHOL, HDL, LDLCALC, TRIG, CHOLHDL, LDLDIRECT in the  last 72 hours. Thyroid function studies No results for input(s): TSH, T4TOTAL, T3FREE, THYROIDAB in the last 72 hours.  Invalid input(s): FREET3 Anemia work up No results for input(s): VITAMINB12, FOLATE, FERRITIN, TIBC, IRON, RETICCTPCT in the last 72 hours. Urinalysis    Component Value Date/Time   COLORURINE YELLOW 02/06/2020 2330   APPEARANCEUR CLEAR 02/06/2020 2330   LABSPEC 1.013 02/06/2020 2330   PHURINE 5.0 02/06/2020 2330   GLUCOSEU >=500 (A) 02/06/2020 2330   HGBUR NEGATIVE 02/06/2020 2330   BILIRUBINUR NEGATIVE 02/06/2020 2330   KETONESUR NEGATIVE 02/06/2020 2330   PROTEINUR >=300 (A) 02/06/2020 2330   UROBILINOGEN 0.2 08/27/2015 0210   NITRITE NEGATIVE 02/06/2020 2330   LEUKOCYTESUR NEGATIVE 02/06/2020 2330   Sepsis Labs Invalid input(s): PROCALCITONIN,  WBC,  LACTICIDVEN Microbiology Recent Results (from the past 240 hour(s))  SARS CORONAVIRUS 2 (TAT 6-24 HRS) Nasopharyngeal Nasopharyngeal Swab     Status: None   Collection Time: 02/07/20  4:31 AM   Specimen: Nasopharyngeal Swab  Result Value Ref Range Status   SARS Coronavirus 2 NEGATIVE NEGATIVE Final    Comment: (NOTE) SARS-CoV-2 target nucleic acids are NOT DETECTED. The SARS-CoV-2 RNA is generally detectable in upper and lower respiratory specimens during the acute phase of infection. Negative results do not preclude SARS-CoV-2 infection, do not rule out co-infections with other  pathogens, and should not be used as the sole basis for treatment or other patient management decisions. Negative results must be combined with clinical observations, patient history, and epidemiological information. The expected result is Negative. Fact Sheet for Patients: SugarRoll.be Fact Sheet for Healthcare Providers: https://www.woods-mathews.com/ This test is not yet approved or cleared by the [place] FDA and  has been authorized for detection and/or diagnosis of SARS-CoV-2  by FDA under an Emergency Use Authorization (EUA). This EUA will remain  in effect (meaning this test can be used) for the duration of the COVID-19 declaration under Section 56 4(b)(1) of the Act, 21 U.S.C. section 360bbb-3(b)(1), unless the authorization is terminated or revoked sooner. Performed at [facility], [place] [address], [place], [place] [address]    Time coordinating discharge: 35 minutes  SIGNED:  Kerney Elbe, DO [facility] 02/10/2020, 8:26 PM Pager is on [place]  If 7PM-7AM, please contact night-coverage [email or web address] Password [email or web address]

## 2020-02-10 NOTE — Progress Notes (Signed)
Physical Therapy Treatment Patient Details Name: [name] MRN: [id number] DOB: 1961/02/09 Today's Date: 02/10/2020    History of Present Illness 59 yo male with onset of L hip pain worsening was admitted, found to have hypertensive urgency.  Has negative L hip and pelvic imaging, but has noted chronic hip pain.  Has AKI, cardiomegaly, PMHx:  bipolar disorder, DM, schizophrenia, HTN, CVA, CHF, dysarthria, medicine noncompliance, low K+, tachycardia, EtOH use, cocaine use, chest pain, gout, depression, tobacco use,     PT Comments    Pt pleasant sitting EOB on arrival stating he is trying to get home today. Pt reports his hip isn't any better but that he has been using RW for awhile and daughter can assist. Pt educated for gait, stairs and HEP with encouragement to continue mobility to increased function of Left hip.     Follow Up Recommendations  Home health PT;Supervision for mobility/OOB     Equipment Recommendations  None recommended by PT    Recommendations for Other Services       Precautions / Restrictions Precautions Precautions: Fall    Mobility  Bed Mobility Overal bed mobility: Modified Independent                Transfers Overall transfer level: Needs assistance   Transfers: Sit to/from Stand Sit to Stand: Supervision         General transfer comment: supervision for safety and hand placement to stand from bed and from step in stairwell  Ambulation/Gait Ambulation/Gait assistance: Min guard Gait Distance (Feet): 90 Feet Assistive device: Rolling walker (2 wheeled) Gait Pattern/deviations: Step-to pattern;Decreased stride length;Trunk flexed   Gait velocity interpretation: 1.31 - 2.62 ft/sec, indicative of limited community ambulator General Gait Details: cues for posture and proximity to RW. Pt walked 90'x 2 with seated rest after stair trial   Stairs Stairs: Yes Stairs assistance: Min guard Stair Management: Step to  pattern;Backwards;With walker Number of Stairs: 1 General stair comments: pt educated for stepping up one step backward and reports he only has 1 step to enter daughter's house   Wheelchair Mobility    Modified Rankin (Stroke Patients Only)       Balance Overall balance assessment: Needs assistance Sitting-balance support: No upper extremity supported;Feet supported Sitting balance-Leahy Scale: Good     Standing balance support: Bilateral upper extremity supported;During functional activity Standing balance-Leahy Scale: Poor                              Cognition Arousal/Alertness: Awake/alert Behavior During Therapy: WFL for tasks assessed/performed Overall Cognitive Status: No family/caregiver present to determine baseline cognitive functioning                                 General Comments: not oriented to day      Exercises General Exercises - Lower Extremity Long Arc Quad: AROM;Left;Seated;15 reps Hip ABduction/ADduction: AROM;Both;Supine;15 reps Hip Flexion/Marching: AROM;Left;Seated;15 reps    General Comments        Pertinent Vitals/Pain Faces Pain Scale: Hurts whole lot Pain Location: L hip and anterior thigh Pain Descriptors / Indicators: Aching;Guarding;Tightness Pain Intervention(s): Limited activity within patient's tolerance;Monitored during session;Repositioned    Home Living                      Prior Function            PT  Goals (current goals can now be found in the care plan section) Progress towards PT goals: Progressing toward goals    Frequency           PT Plan Current plan remains appropriate    Co-evaluation              AM-PAC PT "6 Clicks" Mobility   Outcome Measure  Help needed turning from your back to your side while in a flat bed without using bedrails?: None Help needed moving from lying on your back to sitting on the side of a flat bed without using bedrails?: None Help  needed moving to and from a bed to a chair (including a wheelchair)?: None Help needed standing up from a chair using your arms (e.g., wheelchair or bedside chair)?: A Little Help needed to walk in hospital room?: A Little Help needed climbing 3-5 steps with a railing? : A Little 6 Click Score: 21    End of Session Equipment Utilized During Treatment: Gait belt Activity Tolerance: Patient tolerated treatment well Patient left: in bed;with call bell/phone within reach;with bed alarm set Nurse Communication: Mobility status PT Visit Diagnosis: Unsteadiness on feet (R26.81);Other abnormalities of gait and mobility (R26.89);Difficulty in walking, not elsewhere classified (R26.2);Hemiplegia and hemiparesis;Pain     Time: 1207-1228 PT Time Calculation (min) (ACUTE ONLY): 21 min  Charges:  $Gait Training: 8-22 mins                     Bayard Males, PT Acute Rehabilitation Services Pager: [phone] Office: [phone]    Sandy Salaam [name] 02/10/2020, 1:31 PM

## 2020-02-11 ENCOUNTER — Telehealth: Payer: Self-pay | Admitting: *Deleted

## 2020-02-11 NOTE — Telephone Encounter (Signed)
Pt daughter called for referral to podiatry.  RNCM advised to contact PCP for referral as pt was not ED pt.  RNCM offered Podiatry Clinics:  Podiatry - [facility] [id number]  [phone]   [place]  [phone]

## 2020-02-15 ENCOUNTER — Other Ambulatory Visit: Payer: Self-pay

## 2020-02-15 ENCOUNTER — Ambulatory Visit: Payer: Medicaid Other | Attending: Internal Medicine | Admitting: Physical Therapy

## 2020-02-15 DIAGNOSIS — G8929 Other chronic pain: Secondary | ICD-10-CM | POA: Insufficient documentation

## 2020-02-15 DIAGNOSIS — M5442 Lumbago with sciatica, left side: Secondary | ICD-10-CM | POA: Diagnosis not present

## 2020-02-15 DIAGNOSIS — I69354 Hemiplegia and hemiparesis following cerebral infarction affecting left non-dominant side: Secondary | ICD-10-CM | POA: Diagnosis present

## 2020-02-15 DIAGNOSIS — R262 Difficulty in walking, not elsewhere classified: Secondary | ICD-10-CM | POA: Diagnosis present

## 2020-02-15 DIAGNOSIS — Z9181 History of falling: Secondary | ICD-10-CM | POA: Diagnosis present

## 2020-02-15 NOTE — Therapy (Signed)
[facility] [place] [facility] [address]  [place], [place], [address] Phone: [phone]   Fax:  [phone]  Physical Therapy Evaluation  Patient Details  Name: [name] MRN: [id number] Date of Birth: 1961/10/03 Referring Provider (PT): Kerney Elbe, [place]   Encounter Date: 02/15/2020  PT End of Session - 02/15/20 0831    Visit Number  1    Number of Visits  16    Date for PT Re-Evaluation  04/11/20    Authorization Type  Medicaid    PT Start Time  0831    PT Stop Time  0934    PT Time Calculation (min)  63 min    Activity Tolerance  Patient limited by pain    Behavior During Therapy  [facility] for tasks assessed/performed       Past Medical History:  Diagnosis Date  . Alcohol abuse   . Arthritis   . Crack cocaine use   . Depression   . Diabetes mellitus   . Drug abuse ([place])   . DTs (delirium tremens) ([place])    history of   . Gout   . Noncompliance with medication regimen   . Schizophrenia ([place])   . Stroke ([place])   . Systolic heart failure secondary to hypertension ([place])   . Uncontrolled hypertension     Past Surgical History:  Procedure Laterality Date  . LEFT HEART CATH AND CORONARY ANGIOGRAPHY N/A 02/03/2018   Procedure: LEFT HEART CATH AND CORONARY ANGIOGRAPHY;  Surgeon: Jettie Booze, MD;  Location: [facility];  Service: Cardiovascular;  Laterality: N/A;  . MANDIBLE RECONSTRUCTION    . TONSILLECTOMY      There were no vitals filed for this visit.   Subjective Assessment - 02/15/20 0835    Subjective  2 CVAs affecting L side. Has become less mobile since latest stroke. Also having severe LBP with L LE radiculoapthy.    Patient is accompained by:  Family member   Daughter - Katharine Look   Pertinent History  CVA x 2 with L hemiparesis and neurocognitive disorder, LBP with L LE radiculopathy, uncontrolled HTN, acute systolic heart failure d/t HTN, tachycardia, DM, R diabetic foot wound d/t  ingrown toenail, AKI, ETOH & cocaine abuse, schizophrenia    Limitations  Sitting;Standing;Walking;House hold activities    How long can you sit comfortably?  has to change positions frequent    How long can you stand comfortably?  10 seconds    How long can you walk comfortably?  20-30 ft    Patient Stated Goals  "100% positive"    Currently in Pain?  Yes    Pain Score  --   "13/10"   Pain Location  Back    Pain Orientation  Mid;Lower    Pain Descriptors / Indicators  Sore;Shooting    Pain Type  Chronic pain    Pain Radiating Towards  L LE to foot, spread up into shoulders    Pain Onset  More than a month ago   3 months ago   Pain Frequency  Intermittent    Aggravating Factors   uncertain    Pain Relieving Factors  moving around    Effect of Pain on Daily Activities  pain keeps him from walking and moving around         [facility] PT Assessment - 02/15/20 0831      Assessment   Medical Diagnosis  Acute systolic heart failure; CVA with L hemiparesis; LBP with  L LE radiculopathy    Referring Provider (PT)  Kerney Elbe, DO    Onset Date/Surgical Date  02/10/20   hospital D/C   Hand Dominance  Right    Next MD Visit  TBD - working on establishing new PCP    Prior Therapy  PT while hospitalized      Precautions   Precautions  Fall    Required Braces or Orthoses  Other Brace/Splint    Other Brace/Splint  recommeded darby shoe or cast shoe to protect R foot while toe healing      Restrictions   Weight Bearing Restrictions  No      Balance Screen   Has the patient fallen in the past 6 months  Yes    How many times?  1    Has the patient had a decrease in activity level because of a fear of falling?   Yes    Is the patient reluctant to leave their home because of a fear of falling?   Yes      [place]  Private residence    Living Arrangements  Children    Available Help at Discharge  Family    Type of [place] Access  Level  entry    Home Layout  One level    Pope - 2 wheels;Bedside commode;Shower seat      Prior Function   Level of Independence  Independent   currently needs assist with all ADLs and mobility   Vocation  On disability    Leisure  playing guitar & organ; previously walked alot      Cognition   Overall Cognitive Status  Impaired/Different from baseline    Area of Impairment  Memory      Coordination   Gross Motor Movements are Fluid and Coordinated  Yes      ROM / Strength   AROM / PROM / Strength  Strength      Strength   Overall Strength Comments  tested in sitting    Strength Assessment Site  Hip;Knee;Ankle    Right/Left Hip  Right;Left    Right Hip Flexion  4-/5    Right Hip Extension  4-/5    Right Hip External Rotation   4-/5    Right Hip Internal Rotation  4/5    Right Hip ABduction  4/5    Right Hip ADduction  4/5    Left Hip Flexion  3+/5    Left Hip Extension  4-/5    Left Hip External Rotation  3+/5    Left Hip Internal Rotation  4-/5    Left Hip ABduction  4-/5    Left Hip ADduction  4-/5    Right/Left Knee  Right;Left    Right Knee Flexion  4+/5    Right Knee Extension  4+/5    Left Knee Flexion  4/5    Left Knee Extension  4/5    Right/Left Ankle  Right;Left    Right Ankle Dorsiflexion  4/5    Right Ankle Plantar Flexion  --   testing deferred d/t toe wound   Left Ankle Dorsiflexion  4/5    Left Ankle Plantar Flexion  --   unable to assess due to increased pain in L SLS     Flexibility   Soft Tissue Assessment /Muscle Length  yes    Hamstrings  mod tight B    ITB  mod  tight B    Piriformis  mod/severe tight B      Transfers   Transfers  Sit to Stand;Stand to [employer] Transfers    Sit to Stand  4: Min guard;5: Supervision;With upper extremity assist;From bed;Multiple attempts    Sit to Stand Details  Verbal cues for precautions/safety;Verbal cues for safe use of DME/AE;Verbal cues for sequencing;Visual cues for safe use of  DME/AE    Sit to Stand Details (indicate cue type and reason)  cues to push from seat rather than pull on RW    Stand to Sit  5: Supervision;With upper extremity assist;To elevated surface;Uncontrolled descent    Stand to Sit Details (indicate cue type and reason)  Verbal cues for precautions/safety;Verbal cues for safe use of DME/AE    Stand to Sit Details  cues to release RW and reach back for seat    Stand Pivot Transfers  4: Min guard      Ambulation/Gait   Ambulation/Gait  No   NT d/t R diabetic foot wound and lack of protective footwear               Objective measurements completed on examination: See above findings.      [facility] PT Treatment/Exercise - 02/15/20 0831      Exercises   Exercises  Lumbar      Lumbar Exercises: Stretches   Single Knee to Chest Stretch  Right;Left;30 seconds;1 rep    Lower Trunk Rotation  10 seconds;2 reps    Piriformis Stretch  Right;Left;30 seconds;1 rep    Piriformis Stretch Limitations  supine KTOS      Lumbar Exercises: Supine   Pelvic Tilt  5 reps;5 seconds    Pelvic Tilt Limitations  cues to avoid holding breath             PT Education - 02/15/20 0932    Education Details  PT eval findings, anticipated POC, log rolling technique for bed mobility/transfers, initial HEP    Person(s) Educated  Patient;Child(ren)    Methods  Explanation;Demonstration;Verbal cues;Tactile cues;Handout    Comprehension  Verbalized understanding;Returned demonstration;Verbal cues required;Tactile cues required;Need further instruction       PT Short Term Goals - 02/15/20 0934      PT SHORT TERM GOAL #1   Title  Complete balance and gait assessment once protective footwear available and establish LTGs as indicated    Status  New    Target Date  02/29/20      PT SHORT TERM GOAL #2   Title  Patient will be independent with initial HEP    Status  New    Target Date  02/29/20      PT SHORT TERM GOAL #3   Title  Patient will  demonstrate safe sit to/from stand transfer technique with RW to reduce fall risk    Status  New    Target Date  02/29/20        PT Long Term Goals - 02/15/20 0934      PT LONG TERM GOAL #1   Title  Patient will be independent with ongoing/advanced HEP    Status  New    Target Date  04/11/20      PT LONG TERM GOAL #2   Title  Patient to report reduction in frequency and intensity of low back and LE radicular pain by >/= 50%    Status  New    Target Date  04/11/20      PT LONG TERM  GOAL #3   Title  Patient will demonstrate improved B LE strength to >/= 4/5 to 4+/5 for improved stability and ease of mobility    Status  New    Target Date  04/11/20      PT LONG TERM GOAL #4   Title  Patient will ambulate >/= 500 ft with RW or LRAD demonstrating normal gait pattern and gait speed >/= 2.62 ft sec to increase safety with community ambulation    Status  New    Target Date  04/11/20             Plan - 02/15/20 0934    Clinical Impression Statement  [name] is a 59 y/o male who presents to PT following recent acute care hospitalization for acute systolic heart failure due to hypertensive urgency. Medical history significant for CVA x 2 with L hemiparesis and neurocognitive disorder, LBP with L LE radiculopathy, uncontrolled HTN, acute systolic heart failure d/t HTN, tachycardia, DM, R diabetic foot wound resulting from ingrown toenail, AKI, ETOH & cocaine abuse, and schizophrenia. Patient's primary concerns for PT are due to L sided weakness from CVA and LBP with L LE radiculopathy. Assessment limited by cognitive impairments with patient's daughter providing much of history and clarifying patient's statements. Deficits include moderate to severe LBP with L LE radiculopathy, mild to moderate LE weakness (L>R), decreased proximal LE flexibility, impaired bed mobility and transfers, and impaired gait and balance although further gait and balance assessment indicated once patient has  appropriate protective footwear for R diabetic foot wound. [name] will benefit from skilled PT to address above deficits to improve functional strength, balance and activity tolerance for increased safety and independence with mobility and ambulation. Initial HEP provided to address LBP and LE flexibility with patient noting some relief of pain from stretches.    Personal Factors and Comorbidities  Comorbidity 3+;Past/Current Experience;Behavior Pattern;Fitness;Time since onset of injury/illness/exacerbation;Transportation    Comorbidities  CVA x 2 with L hemiparesis and neurocognitive disorder, LBP with L LE radiculopathy, uncontrolled HTN, acute systolic heart failure d/t HTN, tachycardia, DM, R diabetic foot wound d/t ingrown toenail, AKI, ETOH & cocaine abuse, schizophrenia    Examination-Activity Limitations  Bathing;Bed Mobility;Bend;Caring for Others;Carry;Continence;Dressing;Hygiene/Grooming;Lift;Locomotion Level;Sit;Sleep;Squat;Stairs;Stand;Toileting;Transfers    Examination-Participation Restrictions  Church;Cleaning;Community Activity;Driving;Interpersonal Relationship;Laundry;Medication Management;Meal Prep;Personal Finances;Shop;Volunteer    Stability/Clinical Decision Making  Evolving/Moderate complexity    Clinical Decision Making  Moderate    Rehab Potential  Good    PT Frequency  2x / week    PT Duration  8 weeks    PT Treatment/Interventions  ADLs/Self Care Home Management;Cryotherapy;Electrical Stimulation;Moist Heat;Traction;Ultrasound;DME Instruction;Gait training;Stair training;Functional mobility training;Therapeutic activities;Therapeutic exercise;Balance training;Neuromuscular re-education;Patient/family education;Manual techniques;Passive range of motion;Dry needling;Energy conservation;Taping;Spinal Manipulations    PT Next Visit Plan  montitor VS; balance and gait assessment if proper footwear available; review initial HEP; modalities PRN for pain    PT Home Exercise Plan   02/15/20 - pelvic tilt, SKTC &  KTOS piriformis stretches, LTR    Recommended Other Services  recommended darby shoe/cast shoe to protect R foot while wound healing; recommended tub bench    Consulted and Agree with Plan of Care  Patient;Family member/caregiver    Family Member Consulted  daughter - [name]       Patient will benefit from skilled therapeutic intervention in order to improve the following deficits and impairments:  Abnormal gait, Cardiopulmonary status limiting activity, Decreased activity tolerance, Decreased balance, Decreased cognition, Decreased coordination, Decreased endurance, Decreased knowledge of precautions, Decreased knowledge of use  of DME, Decreased mobility, Decreased range of motion, Decreased safety awareness, Decreased skin integrity, Decreased strength, Difficulty walking, Increased muscle spasms, Impaired perceived functional ability, Impaired flexibility, Impaired sensation, Impaired tone, Improper body mechanics, Postural dysfunction, Pain  Visit Diagnosis: Chronic bilateral low back pain with left-sided sciatica - Plan: PT plan of care cert/re-cert  Hemiplegia and hemiparesis following cerebral infarction affecting left non-dominant side (HCC) - Plan: PT plan of care cert/re-cert  Difficulty in walking, not elsewhere classified - Plan: PT plan of care cert/re-cert  History of falling - Plan: PT plan of care cert/re-cert     Problem List Patient Active Problem List   Diagnosis Date Noted  . Abnormal EKG 02/07/2020  . Left hip pain 02/07/2020  . AKI (acute kidney injury) ([place]) 02/07/2020  . Systolic heart failure secondary to hypertension ([place])   . Schizophrenia ([place])   . Acute systolic heart failure ([place]) 02/02/2018  . Chest pain 01/31/2018  . Hypokalemia   . Schizoaffective disorder, bipolar type ([place]) 11/22/2017  . Trauma   . Tachycardia 03/03/2017  . Alcohol abuse   . Cocaine abuse ([place])   . Hypertensive urgency   . Tachycardia    . Cocaine abuse with cocaine-induced mood disorder ([place]) 12/05/2016  . Cerebrovascular disease 09/02/2015  . 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 Malignant hypertension 08/30/2015  . Alcohol use disorder, severe, dependence ([place]) 08/30/2015  . Alcohol withdrawal ([place]) 08/30/2015  . Alcohol abuse with alcohol-induced mood disorder ([place]) 08/30/2015  . Thrombocytopenia ([place]) 08/27/2015  . Tobacco use disorder 08/26/2015  . Hyperlipidemia 04/28/2015  . Schizoaffective disorder, depressive type ([place])   . Gout 08/09/2013  . Diabetes mellitus ([place]) 07/09/2012    Percival Spanish, PT, MPT 02/15/2020, 3:14 PM  [facility] [address]  [place], [place], [address] Phone: [phone]   Fax:  [phone]  Name: [name] MRN: [id number] Date of Birth: [birth year]

## 2020-02-15 NOTE — Patient Instructions (Signed)
    Home exercise program created by JoAnne Kreis, PT.  For questions, please contact JoAnne via phone at [phone] or email at [email or web address]  [facility] [place] [address]  [address] [place], [place], [address] Phone: [phone]   Fax:  [phone]    

## 2020-02-21 ENCOUNTER — Other Ambulatory Visit: Payer: Self-pay

## 2020-02-21 ENCOUNTER — Ambulatory Visit: Payer: Medicaid Other | Attending: Internal Medicine | Admitting: Physical Therapy

## 2020-02-21 ENCOUNTER — Encounter: Payer: Self-pay | Admitting: Physical Therapy

## 2020-02-21 VITALS — BP 160/98 | HR 91

## 2020-02-21 DIAGNOSIS — R262 Difficulty in walking, not elsewhere classified: Secondary | ICD-10-CM | POA: Insufficient documentation

## 2020-02-21 DIAGNOSIS — M5442 Lumbago with sciatica, left side: Secondary | ICD-10-CM | POA: Insufficient documentation

## 2020-02-21 DIAGNOSIS — G8929 Other chronic pain: Secondary | ICD-10-CM | POA: Insufficient documentation

## 2020-02-21 DIAGNOSIS — I69354 Hemiplegia and hemiparesis following cerebral infarction affecting left non-dominant side: Secondary | ICD-10-CM | POA: Diagnosis present

## 2020-02-21 DIAGNOSIS — Z9181 History of falling: Secondary | ICD-10-CM

## 2020-02-21 NOTE — Therapy (Signed)
[facility] [address]  [place], [place], [address] Phone: [phone]   Fax:  [phone]  Physical Therapy Treatment  Patient Details  Name: [name] MRN: [id number] Date of Birth: 07/29/1961 Referring Provider (PT): Kerney Elbe, [place]   Encounter Date: 02/21/2020  PT End of Session - 02/21/20 1018    Visit Number  2    Number of Visits  16    Date for PT Re-Evaluation  04/11/20    Authorization Type  Medicaid    Authorization Time Period  02/21/20 - 02/29/20    Authorization - Visit Number  1    Authorization - Number of Visits  3    PT Start Time  1018    PT Stop Time  1120    PT Time Calculation (min)  62 min    Activity Tolerance  Patient limited by pain;Treatment limited secondary to medical complications (Comment)   elevated BP   Behavior During Therapy  [facility] for tasks assessed/performed       Past Medical History:  Diagnosis Date  . Alcohol abuse   . Arthritis   . Crack cocaine use   . Depression   . Diabetes mellitus   . Drug abuse ([place])   . DTs (delirium tremens) ([place])    history of   . Gout   . Noncompliance with medication regimen   . Schizophrenia ([place])   . Stroke ([place])   . Systolic heart failure secondary to hypertension ([place])   . Uncontrolled hypertension     Past Surgical History:  Procedure Laterality Date  . LEFT HEART CATH AND CORONARY ANGIOGRAPHY N/A 02/03/2018   Procedure: LEFT HEART CATH AND CORONARY ANGIOGRAPHY;  Surgeon: Jettie Booze, MD;  Location: [facility];  Service: Cardiovascular;  Laterality: N/A;  . MANDIBLE RECONSTRUCTION    . TONSILLECTOMY      Vitals:   02/21/20 1021 02/21/20 1048 02/21/20 1106  BP: (!) 176/120 (!) 168/102 (!) 160/98  Pulse: 96 87 91  SpO2: 96% 98% 96%    Subjective Assessment - 02/21/20 1021    Subjective  Pt with difficulty localizing or qualifying pain today. Dtr reports elevated BP may be due to pt  binging on potato chips this morning.    Patient is accompained by:  Family member   Daughter - [name]   Pertinent History  CVA x 2 with L hemiparesis and neurocognitive disorder, LBP with L LE radiculopathy, uncontrolled HTN, acute systolic heart failure d/t HTN, tachycardia, DM, R diabetic foot wound d/t ingrown toenail, AKI, ETOH & cocaine abuse, schizophrenia    Patient Stated Goals  "100% positive"    Currently in Pain?  Yes    Pain Score  --   "13/10"   Pain Location  Other (Comment)   "all over" - back, L side and L leg   Pain Orientation  Left    Pain Descriptors / Indicators  Throbbing    Pain Type  Chronic pain    Pain Onset  --   3 months ago   Pain Frequency  Intermittent         [facility] PT Assessment - 02/21/20 1018      Assessment   Next MD Visit  02/23/20 - Volney Presser, NP (new PCP)                   [facility] Adult PT Treatment/Exercise - 02/21/20 1018      Exercises  Exercises  Lumbar      Lumbar Exercises: Stretches   Single Knee to Chest Stretch  Right;Left;30 seconds;2 reps    Single Knee to Chest Stretch Limitations  opp knee flexed    Lower Trunk Rotation  20 seconds;2 reps    Piriformis Stretch  Right;Left;30 seconds;2 reps    Piriformis Stretch Limitations  supine KTOS      Lumbar Exercises: Supine   Pelvic Tilt  10 reps;5 seconds    Pelvic Tilt Limitations  cues to avoid holding breath      Modalities   Modalities  Electrical Stimulation;Moist Heat      Moist Heat Therapy   Number Minutes Moist Heat  15 Minutes    Moist Heat Location  Lumbar Spine;Hip   L low bacl/buttocks     Electrical Stimulation   Electrical Stimulation Location  L lumbar paraspinals/buttocks    Electrical Stimulation Action  IFC    Electrical Stimulation Parameters  80-150 Hz, intensity to pt tol x 15'      Manual Therapy   Manual Therapy  Soft tissue mobilization;Myofascial release    Manual therapy comments  R side lying with L LE supported on bolster     Soft tissue mobilization  STM/DTM to L lumbar paraspinals, glutes and piriformis - pt localizing ttp to L glutes and piriformis    Myofascial Release  manual TPR to L glute med/min & piriformis, pin & stretch to L lumbar paraspinals - relief of pain reported with manual DTM/TPR               PT Short Term Goals - 02/21/20 1105      PT SHORT TERM GOAL #1   Title  Complete balance and gait assessment once protective footwear available and establish LTGs as indicated    Status  On-going    Target Date  02/29/20      PT SHORT TERM GOAL #2   Title  Patient will be independent with initial HEP    Status  On-going    Target Date  02/29/20      PT SHORT TERM GOAL #3   Title  Patient will demonstrate safe sit to/from stand transfer technique with RW to reduce fall risk    Status  On-going    Target Date  02/29/20        PT Long Term Goals - 02/21/20 1105      PT LONG TERM GOAL #1   Title  Patient will be independent with ongoing/advanced HEP    Status  On-going    Target Date  04/11/20      PT LONG TERM GOAL #2   Title  Patient to report reduction in frequency and intensity of low back and LE radicular pain by >/= 50%    Status  On-going    Target Date  04/11/20      PT LONG TERM GOAL #3   Title  Patient will demonstrate improved B LE strength to >/= 4/5 to 4+/5 for improved stability and ease of mobility    Status  On-going    Target Date  04/11/20      PT LONG TERM GOAL #4   Title  Patient will ambulate >/= 500 ft with RW or LRAD demonstrating normal gait pattern and gait speed >/= 2.62 ft sec to increase safety with community ambulation    Status  On-going    Target Date  04/11/20  Plan - 02/21/20 1023    Clinical Impression Statement  [name] continues to report "off the chart" pain levels at "13/10" "all over" in back and L side/LE upon arrival to PT today. BP severely elevated (176/120) preventing any exertion or balance testing as originally  planned. Treatment focused on pain management with stretching/review of initial HEP, manual therapy and modalities while closely monitoring BP, with BP dropping in correlation with declining pain ratings (10/10 pain and 168/102 BP after manual therapy and stretching; 8/10 pain and 160/98 BP after estim and moist heat).    Personal Factors and Comorbidities  Comorbidity 3+;Past/Current Experience;Behavior Pattern;Fitness;Time since onset of injury/illness/exacerbation;Transportation    Comorbidities  CVA x 2 with L hemiparesis and neurocognitive disorder, LBP with L LE radiculopathy, uncontrolled HTN, acute systolic heart failure d/t HTN, tachycardia, DM, R diabetic foot wound d/t ingrown toenail, AKI, ETOH & cocaine abuse, schizophrenia    Examination-Activity Limitations  Bathing;Bed Mobility;Bend;Caring for Others;Carry;Continence;Dressing;Hygiene/Grooming;Lift;Locomotion Level;Sit;Sleep;Squat;Stairs;Stand;Toileting;Transfers    Examination-Participation Restrictions  Church;Cleaning;Community Activity;Driving;Interpersonal Relationship;Laundry;Medication Management;Meal Prep;Personal Finances;Shop;Volunteer    Rehab Potential  Good    PT Frequency  2x / week    PT Duration  8 weeks    PT Treatment/Interventions  ADLs/Self Care Home Management;Cryotherapy;Electrical Stimulation;Moist Heat;Traction;Ultrasound;DME Instruction;Gait training;Stair training;Functional mobility training;Therapeutic activities;Therapeutic exercise;Balance training;Neuromuscular re-education;Patient/family education;Manual techniques;Passive range of motion;Dry needling;Energy conservation;Taping;Spinal Manipulations    PT Next Visit Plan  montitor VS; balance and gait assessment if proper footwear available and VS allow; lumbopelvic flexibility and strentgthening; manual therapy and modalities PRN for pain    PT Home Exercise Plan  02/15/20 - pelvic tilt, SKTC &  KTOS piriformis stretches, LTR    Consulted and Agree with Plan  of Care  Patient;Family member/caregiver    Family Member Consulted  daughter - [name]       Patient will benefit from skilled therapeutic intervention in order to improve the following deficits and impairments:  Abnormal gait, Cardiopulmonary status limiting activity, Decreased activity tolerance, Decreased balance, Decreased cognition, Decreased coordination, Decreased endurance, Decreased knowledge of precautions, Decreased knowledge of use of DME, Decreased mobility, Decreased range of motion, Decreased safety awareness, Decreased skin integrity, Decreased strength, Difficulty walking, Increased muscle spasms, Impaired perceived functional ability, Impaired flexibility, Impaired sensation, Impaired tone, Improper body mechanics, Postural dysfunction, Pain  Visit Diagnosis: Chronic bilateral low back pain with left-sided sciatica  Hemiplegia and 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 Abnormal EKG 02/07/2020  . Left hip pain 02/07/2020  . AKI (acute kidney injury) ([place]) 02/07/2020  . Systolic heart failure secondary to hypertension ([place])   . Schizophrenia ([place])   . Acute systolic heart failure ([place]) 02/02/2018  . Chest pain 01/31/2018  . Hypokalemia   . Schizoaffective disorder, bipolar type ([place]) 11/22/2017  . Trauma   . Tachycardia 03/03/2017  . Alcohol abuse   . Cocaine abuse ([place])   . Hypertensive urgency   . Tachycardia   . Cocaine abuse with cocaine-induced mood disorder ([place]) 12/05/2016  . Cerebrovascular disease 09/02/2015  . 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 Malignant hypertension 08/30/2015  . Alcohol use disorder, severe, dependence ([place]) 08/30/2015  . Alcohol withdrawal ([place]) 08/30/2015  . Alcohol abuse with  alcohol-induced mood disorder ([place]) 08/30/2015  . Thrombocytopenia ([place]) 08/27/2015  . Tobacco use disorder 08/26/2015  . Hyperlipidemia 04/28/2015  . Schizoaffective  disorder, depressive type ([place])   . Gout 08/09/2013  . Diabetes mellitus ([place]) 07/09/2012    Percival Spanish, PT, MPT 02/21/2020, 12:35 PM  [facility] [address]  [place], [place], [address] Phone: [phone]   Fax:  [phone]  Name: [name] MRN: [id number] Date of Birth: May 21, 1961

## 2020-02-24 ENCOUNTER — Encounter: Payer: Self-pay | Admitting: Physical Therapy

## 2020-02-24 ENCOUNTER — Ambulatory Visit: Payer: Medicaid Other | Admitting: Physical Therapy

## 2020-02-24 ENCOUNTER — Other Ambulatory Visit: Payer: Self-pay

## 2020-02-24 VITALS — BP 138/96 | HR 83

## 2020-02-24 DIAGNOSIS — G8929 Other chronic pain: Secondary | ICD-10-CM

## 2020-02-24 DIAGNOSIS — Z9181 History of falling: Secondary | ICD-10-CM

## 2020-02-24 DIAGNOSIS — R262 Difficulty in walking, not elsewhere classified: Secondary | ICD-10-CM

## 2020-02-24 DIAGNOSIS — M5442 Lumbago with sciatica, left side: Secondary | ICD-10-CM | POA: Diagnosis not present

## 2020-02-24 DIAGNOSIS — I69354 Hemiplegia and hemiparesis following cerebral infarction affecting left non-dominant side: Secondary | ICD-10-CM

## 2020-02-24 NOTE — Patient Instructions (Signed)
TENS UNIT  This is helpful for muscle pain and spasm.   Search and Purchase a TENS 7000 2nd edition at www.tenspros.com or www.amazon.com  (It should be less than $30)     TENS unit instructions:   Do not shower or bathe with the unit on  Turn the unit off before removing electrodes or batteries  If the electrodes lose stickiness add a drop of water to the electrodes after they are disconnected from the unit and place on plastic sheet. If you continued to have difficulty, call the TENS unit company to purchase more electrodes.  Do not apply lotion on the skin area prior to use. Make sure the skin is clean and dry as this will help prolong the life of the electrodes.  After use, always check skin for unusual red areas, rash or other skin difficulties. If there are any skin problems, does not apply electrodes to the same area.  Never remove the electrodes from the unit by pulling the wires.  Do not use the TENS unit or electrodes other than as directed.  Do not change electrode placement without consulting your therapist or physician.  Keep 2 fingers with between each electrode.   TENS stands for Transcutaneous Electrical Nerve Stimulation. In other words, electrical impulses are allowed to pass through the skin in order to excite a nerve.   Purpose and Use of TENS:  TENS is a method used to manage acute and chronic pain without the use of drugs. It has been effective in managing pain associated with surgery, sprains, strains, trauma, rheumatoid arthritis, and neuralgias. It is a non-addictive, low risk, and non-invasive technique used to control pain. It is not, by any means, a curative form of treatment.   How TENS Works:  Most TENS units are a Paramedic unit powered by one 9 volt battery. Attached to the outside of the unit are two lead wires where two pins and/or snaps connect on each wire. All units come with a set of four reusable pads or electrodes. These are placed  on the skin surrounding the area involved. By inserting the leads into  the pads, the electricity can pass from the unit making the circuit complete.  As the intensity is turned up slowly, the electrical current enters the body from the electrodes through the skin to the surrounding nerve fibers. This triggers the release of hormones from within the body. These hormones contain pain relievers. By increasing the circulation of these hormones, the person's pain may be lessened. It is also believed that the electrical stimulation itself helps to block the pain messages being sent to the brain, thus also decreasing the body's perception of pain.   Hazards:  TENS units are NOT to be used by patients with PACEMAKERS, DEFIBRILLATORS, DIABETIC PUMPS, PREGNANT WOMEN, and patients with SEIZURE DISORDERS.  TENS units are NOT to be used over the heart, throat, brain, or spinal cord.  One of the major side effects from the TENS unit may be skin irritation. Some people may develop a rash if they are sensitive to the materials used in the electrodes or the connecting wires.   Wear the unit for up to 30-45 minutes at a time, 3-4x/day.   Avoid overuse due the body getting used to the stem making it not as effective over time.

## 2020-02-24 NOTE — Therapy (Signed)
[facility] [address]  [place], [place], [address] Phone: [phone]   Fax:  [phone]  Physical Therapy Treatment  Patient Details  Name: [name] MRN: [id number] Date of Birth: 11/29/1960 Referring Provider (PT): Kerney Elbe, [place]   Encounter Date: 02/24/2020  PT End of Session - 02/24/20 1020    Visit Number  3    Number of Visits  16    Date for PT Re-Evaluation  04/11/20    Authorization Type  Medicaid    Authorization Time Period  02/21/20 - 02/29/20    Authorization - Visit Number  2    Authorization - Number of Visits  3    PT Start Time  1020    PT Stop Time  1120    PT Time Calculation (min)  60 min    Activity Tolerance  Patient limited by lethargy   elevated BP   Behavior During Therapy  [facility] for tasks assessed/performed;Flat affect       Past Medical History:  Diagnosis Date  . Alcohol abuse   . Arthritis   . Crack cocaine use   . Depression   . Diabetes mellitus   . Drug abuse ([place])   . DTs (delirium tremens) ([place])    history of   . Gout   . Noncompliance with medication regimen   . Schizophrenia ([place])   . Stroke ([place])   . Systolic heart failure secondary to hypertension ([place])   . Uncontrolled hypertension     Past Surgical History:  Procedure Laterality Date  . LEFT HEART CATH AND CORONARY ANGIOGRAPHY N/A 02/03/2018   Procedure: LEFT HEART CATH AND CORONARY ANGIOGRAPHY;  Surgeon: Jettie Booze, MD;  Location: [facility];  Service: Cardiovascular;  Laterality: N/A;  . MANDIBLE RECONSTRUCTION    . TONSILLECTOMY      Vitals:   02/24/20 1025  BP: (!) 138/96  Pulse: 83  SpO2: 98%    Subjective Assessment - 02/24/20 1030    Subjective  [name] daughter reports his PCP started him on Tramadol for pain yesterday. Very lethargic today.    Patient is accompained by:  Family member   Daughter - Katharine Look   Pertinent History  CVA x 2 with L hemiparesis and  neurocognitive disorder, LBP with L LE radiculopathy, uncontrolled HTN, acute systolic heart failure d/t HTN, tachycardia, DM, R diabetic foot wound d/t ingrown toenail, AKI, ETOH & cocaine abuse, schizophrenia    Patient Stated Goals  "100% positive"    Currently in Pain?  Yes    Pain Score  8    7-8/10   Pain Location  Back    Pain Orientation  Left;Lower    Pain Descriptors / Indicators  Burning   "just there"   Pain Type  Chronic pain    Pain Radiating Towards  LE leg    Pain Onset  --    Pain Frequency  Intermittent         [facility] PT Assessment - 02/24/20 1020      Ambulation/Gait   Ambulation/Gait Assistance  4: Min guard;4: Min assist    Ambulation Distance (Feet)  40 Feet    Assistive device  Rolling walker    Gait Pattern  Step-through pattern;Trunk flexed;Poor foot clearance - left;Poor foot clearance - right;Decreased hip/knee flexion - right;Decreased hip/knee flexion - left;Decreased step length - right;Decreased step length - left;Decreased stride length;Lateral trunk lean to right    Ambulation Surface  Level;Indoor    Gait velocity  1.08 ft/sec      Standardized Balance Assessment   Standardized Balance Assessment  Berg Balance Test;Timed Up and Go Test;10 meter walk test    10 Meter Walk  30.25 sec with RW      Berg Balance Test   Sit to Stand  Able to stand  independently using hands    Standing Unsupported  Unable to stand 30 seconds unassisted   due to L LE "burning pain"   Sitting with Back Unsupported but Feet Supported on Floor or Stool  Able to sit 2 minutes under supervision    Stand to Sit  Controls descent by using hands    Transfers  Able to transfer with verbal cueing and /or supervision    Standing Unsupported with Eyes Closed  Needs help to keep from falling    Standing Unsupported with Feet Together  Needs help to attain position and unable to hold for 15 seconds    From Standing, Reach Forward with Outstretched Arm  Loses balance while  trying/requires external support    From Standing Position, Pick up Object from Floor  Unable to try/needs assist to keep balance    From Standing Position, Turn to Look Behind Over each Shoulder  Needs assist to keep from losing balance and falling    Turn 360 Degrees  Needs assistance while turning    Standing Unsupported, Alternately Place Feet on Step/Stool  Needs assistance to keep from falling or unable to try    Standing Unsupported, One Foot in [employer] balance while stepping or standing    Standing on One Leg  Unable to try or needs assist to prevent fall    Total Score  11    Berg comment:  < 36 high risk for falls (close to 100%)       Timed Up and Go Test   Normal TUG (seconds)  43.06   with RW                  [facility] Adult PT Treatment/Exercise - 02/24/20 1020      Transfers   Sit to Stand  4: Min guard;5: Supervision;With upper extremity assist;From bed;Multiple attempts    Sit to Stand Details  Verbal cues for precautions/safety;Verbal cues for safe use of DME/AE;Verbal cues for sequencing    Sit to Stand Details (indicate cue type and reason)  cues only necessary on 1st attempt with pt using appropriate hand placement and technique on subsequent attempts    Stand to Sit  5: Supervision;With upper extremity assist;Uncontrolled descent    Stand to Sit Details (indicate cue type and reason)  Verbal cues for precautions/safety;Verbal cues for safe use of DME/AE;Verbal cues for technique;Visual cues for safe use of DME/AE    Stand to Sit Details   repeated cues to complete turn and align body/RW with seat before initiating stand to sit as pt attempting to pivot into seat while lowering self onto seat creating increased fall risk    Stand Pivot Transfers  4: Min guard;5: Supervision      Modalities   Modalities  Electrical Stimulation;Moist Heat      Moist Heat Therapy   Number Minutes Moist Heat  15 Minutes    Moist Heat Location  Lumbar Spine;Hip   low  bacl/buttocks     Electrical Stimulation   Electrical Stimulation Location  B lumbar paraspinals/buttocks    Electrical Stimulation Action  IFC    Electrical  Stimulation Parameters  80-150 Hz, intensity to pt tol x 15'    Electrical Stimulation Goals  Pain;Tone               PT Short Term Goals - 02/24/20 1100      PT SHORT TERM GOAL #1   Title  Complete balance and gait assessment once protective footwear available and establish LTGs as indicated    Status  Achieved   02/24/20     PT SHORT TERM GOAL #2   Title  Patient will be independent with initial HEP    Status  On-going    Target Date  02/29/20      PT SHORT TERM GOAL #3   Title  Patient will demonstrate safe sit to/from stand transfer technique with RW to reduce fall risk    Status  On-going    Target Date  02/29/20        PT Long Term Goals - 02/24/20 1100      PT LONG TERM GOAL #1   Title  Patient will be independent with ongoing/advanced HEP    Status  On-going    Target Date  04/11/20      PT LONG TERM GOAL #2   Title  Patient to report reduction in frequency and intensity of low back and LE radicular pain by >/= 50%    Status  On-going    Target Date  04/11/20      PT LONG TERM GOAL #3   Title  Patient will demonstrate improved B LE strength to >/= 4/5 to 4+/5 for improved stability and ease of mobility    Status  On-going    Target Date  04/11/20      PT LONG TERM GOAL #4   Title  Patient will ambulate >/= 500 ft with RW or LRAD demonstrating normal gait pattern and gait speed >/= 2.62 ft/sec to increase safety with community ambulation    Status  On-going    Target Date  04/11/20      PT LONG TERM GOAL #5   Title  Patient will improve Berg score to >/= 36/56 to improve safety stability with ADLs in standing and reduce risk for falls    Status  New    Target Date  04/11/20            Plan - 02/24/20 1839    Clinical Impression Statement  Sandip's daughter reports he had his  initial visit with his new PCP yesterday and was started on Tramadol for pain - pain better controlled today and VS especially BP much improved with new pain meds however patient very lethargic, having difficulty staying awake during therapy session. Lethargy and pain limiting safety awareness with transfers and gait as well as impacting standardized balance testing with all testing indicating a very high risk for falls. Unable to review HEP today due to increased time with standardized testing due to lethargy. Stanlee will benefit from continued skilled PT to address low back and L LE radicular pain, strength, balance and activity tolerance for increased safety and independence with mobility and ambulation.    Personal Factors and Comorbidities  Comorbidity 3+;Past/Current Experience;Behavior Pattern;Fitness;Time since onset of injury/illness/exacerbation;Transportation    Comorbidities  CVA x 2 with L hemiparesis and neurocognitive disorder, LBP with L LE radiculopathy, uncontrolled HTN, acute systolic heart failure d/t HTN, tachycardia, DM, R diabetic foot wound d/t ingrown toenail, AKI, ETOH & cocaine abuse, schizophrenia    Examination-Activity Limitations  Bathing;Bed Mobility;Bend;Caring for  Others;Carry;Continence;Dressing;Hygiene/Grooming;Lift;Locomotion Level;Sit;Sleep;Squat;Stairs;Stand;Toileting;Transfers    Examination-Participation Restrictions  Church;Cleaning;Community Activity;Driving;Interpersonal Relationship;Laundry;Medication Management;Meal Prep;Personal Finances;Shop;Volunteer    Rehab Potential  Good    PT Frequency  2x / week    PT Duration  8 weeks    PT Treatment/Interventions  ADLs/Self Care Home Management;Cryotherapy;Electrical Stimulation;Moist Heat;Traction;Ultrasound;DME Instruction;Gait training;Stair training;Functional mobility training;Therapeutic activities;Therapeutic exercise;Balance training;Neuromuscular re-education;Patient/family education;Manual  techniques;Passive range of motion;Dry needling;Energy conservation;Taping;Spinal Manipulations    PT Next Visit Plan  montitor VS; HEP review; lumbopelvic flexibility and strengthening; manual therapy and modalities PRN for pain    PT Home Exercise Plan  02/15/20 - pelvic tilt, SKTC &  KTOS piriformis stretches, LTR    Consulted and Agree with Plan of Care  Patient;Family member/caregiver    Family Member Consulted  daughter - [name]       Patient will benefit from skilled therapeutic intervention in order to improve the following deficits and impairments:  Abnormal gait, Cardiopulmonary status limiting activity, Decreased activity tolerance, Decreased balance, Decreased cognition, Decreased coordination, Decreased endurance, Decreased knowledge of precautions, Decreased knowledge of use of DME, Decreased mobility, Decreased range of motion, Decreased safety awareness, Decreased skin integrity, Decreased strength, Difficulty walking, Increased muscle spasms, Impaired perceived functional ability, Impaired flexibility, Impaired sensation, Impaired tone, Improper body mechanics, Postural dysfunction, Pain  Visit Diagnosis: Chronic bilateral low back pain with left-sided sciatica  Hemiplegia and 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 Abnormal EKG 02/07/2020  . Left hip pain 02/07/2020  . AKI (acute kidney injury) ([place]) 02/07/2020  . Systolic heart failure secondary to hypertension ([place])   . Schizophrenia ([place])   . Acute systolic heart failure ([place]) 02/02/2018  . Chest pain 01/31/2018  . Hypokalemia   . Schizoaffective disorder, bipolar type ([place]) 11/22/2017  . Trauma   . Tachycardia 03/03/2017  . Alcohol abuse   . Cocaine abuse ([place])   . Hypertensive urgency   . Tachycardia   . Cocaine abuse  with cocaine-induced mood disorder ([place]) 12/05/2016  . Cerebrovascular disease 09/02/2015  . 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 Malignant hypertension 08/30/2015  . Alcohol use disorder, severe, dependence ([place]) 08/30/2015  . Alcohol withdrawal ([place]) 08/30/2015  . Alcohol abuse with alcohol-induced mood disorder ([place]) 08/30/2015  . Thrombocytopenia ([place]) 08/27/2015  . Tobacco use disorder 08/26/2015  . Hyperlipidemia 04/28/2015  . Schizoaffective disorder, depressive type ([place])   . Gout 08/09/2013  . Diabetes mellitus ([place]) 07/09/2012    Percival Spanish, PT, MPT 02/24/2020, 7:40 PM  [facility] [address]  [place], [place], [address] Phone: [phone]   Fax:  [phone]  Name: Evie Crumpler MRN: [id number] Date of Birth: [birth year]

## 2020-02-28 ENCOUNTER — Other Ambulatory Visit: Payer: Self-pay

## 2020-02-28 ENCOUNTER — Ambulatory Visit: Payer: Medicaid Other

## 2020-02-28 VITALS — BP 138/90 | HR 78

## 2020-02-28 DIAGNOSIS — R262 Difficulty in walking, not elsewhere classified: Secondary | ICD-10-CM

## 2020-02-28 DIAGNOSIS — G8929 Other chronic pain: Secondary | ICD-10-CM

## 2020-02-28 DIAGNOSIS — Z9181 History of falling: Secondary | ICD-10-CM

## 2020-02-28 DIAGNOSIS — M5442 Lumbago with sciatica, left side: Secondary | ICD-10-CM | POA: Diagnosis not present

## 2020-02-28 DIAGNOSIS — I69354 Hemiplegia and hemiparesis following cerebral infarction affecting left non-dominant side: Secondary | ICD-10-CM

## 2020-02-28 NOTE — Therapy (Addendum)
[facility] [address]  [place], [place], [address] Phone: [phone]   Fax:  [phone]  Physical Therapy Treatment  Patient Details  Name: [name] MRN: [id number] Date of Birth: December 04, 1960 Referring Provider (PT): Kerney Elbe, [place]   Encounter Date: 02/28/2020  PT End of Session - 02/28/20 1020    Visit Number  4    Number of Visits  16    Date for PT Re-Evaluation  04/11/20    Authorization Type  Medicaid    Authorization Time Period  02/21/20 - 02/29/20    Authorization - Visit Number  3    Authorization - Number of Visits  3    PT Start Time  1601    PT Stop Time  1100    PT Time Calculation (min)  45 min    Activity Tolerance  Patient tolerated treatment well   elevated BP   Behavior During Therapy  [facility] for tasks assessed/performed       Past Medical History:  Diagnosis Date  . Alcohol abuse   . Arthritis   . Crack cocaine use   . Depression   . Diabetes mellitus   . Drug abuse ([place])   . DTs (delirium tremens) ([place])    history of   . Gout   . Noncompliance with medication regimen   . Schizophrenia ([place])   . Stroke ([place])   . Systolic heart failure secondary to hypertension ([place])   . Uncontrolled hypertension     Past Surgical History:  Procedure Laterality Date  . LEFT HEART CATH AND CORONARY ANGIOGRAPHY N/A 02/03/2018   Procedure: LEFT HEART CATH AND CORONARY ANGIOGRAPHY;  Surgeon: Jettie Booze, MD;  Location: [facility];  Service: Cardiovascular;  Laterality: N/A;  . MANDIBLE RECONSTRUCTION    . TONSILLECTOMY      Vitals:   02/28/20 1018  BP: 138/90  Pulse: 78  SpO2: 98%    Subjective Assessment - 02/28/20 1018    Subjective  Pt. reporting short periods of 10/10 LBP over weekend.    Pertinent History  CVA x 2 with L hemiparesis and neurocognitive disorder, LBP with L LE radiculopathy, uncontrolled HTN, acute systolic heart failure d/t HTN,  tachycardia, DM, R diabetic foot wound d/t ingrown toenail, AKI, ETOH & cocaine abuse, schizophrenia    Patient Stated Goals  "100% positive"    Currently in Pain?  Yes    Pain Score  8    up to 10/10 at times without known trigger   Pain Location  Back    Pain Orientation  Left;Lower    Pain Descriptors / Indicators  Burning    Pain Type  Chronic pain    Pain Radiating Towards  pain in L LE down lateral/posterior thigh    Pain Onset  More than a month ago    Pain Frequency  Intermittent    Aggravating Factors   Unsure    Multiple Pain Sites  No              Therex  B Supine KTOS stretch x 30 sec - cues required for proper hold time s B Supine SKTC stretch x 30 sec          [facility] Adult PT Treatment/Exercise - 02/28/20 0001      Transfers   Transfers  Sit to Stand;Stand to Sit;Stand Pivot Transfers    Sit to Stand  4: Min guard;5: Supervision;With upper extremity assist;From  bed;Multiple attempts    Sit to Stand Details  Verbal cues for precautions/safety;Verbal cues for safe use of DME/AE;Verbal cues for sequencing    Sit to Stand Details (indicate cue type and reason)  cues for proper hand placement with sit<>stand with first rep for safe transfer; good carryover for next attempts       Self-Care   Self-Care  Other Self-Care Comments    Other Self-Care Comments   Instruction in log rolling and overall bed mobilty to reduce lumbar strain;  review of HEP to check for understanding, proper LE stretch hold times, increased time required for pt. to verbalize his HEP performance frequency       Lumbar Exercises: Seated   Sit to Stand  5 reps   x 2 sets    Sit to Stand Limitations  Cueing required initiallyfor proper hand placement with sit<>stand from chair to [place] for safety                PT Short Term Goals - 02/28/20 1212      PT SHORT TERM GOAL #1   Title  Complete balance and gait assessment once protective footwear available and establish LTGs as  indicated    Status  Achieved   02/24/20     PT SHORT TERM GOAL #2   Title  Patient will be independent with initial HEP    Status  Partially Met   02/28/20: required cueing for proper hold times however did verbalize/demonstrate understanding with reference to handout   Target Date  02/29/20      PT SHORT TERM GOAL #3   Title  Patient will demonstrate safe sit to/from stand transfer technique with RW to reduce fall risk    Status  On-going    Target Date  02/29/20        PT Long Term Goals - 02/28/20 1223      PT LONG TERM GOAL #1   Title  Patient will be independent with ongoing/advanced HEP    Status  On-going      PT LONG TERM GOAL #2   Title  Patient to report reduction in frequency and intensity of low back and LE radicular pain by >/= 50%    Status  On-going      PT LONG TERM GOAL #3   Title  Patient will demonstrate improved B LE strength to >/= 4/5 to 4+/5 for improved stability and ease of mobility    Status  On-going      PT LONG TERM GOAL #4   Title  Patient will ambulate >/= 500 ft with RW or LRAD demonstrating normal gait pattern and gait speed >/= 2.62 ft/sec to increase safety with community ambulation    Status  On-going      PT LONG TERM GOAL #5   Title  Patient will improve Berg score to >/= 36/56 to improve safety stability with ADLs in standing and reduce risk for falls    Status  On-going            Plan - 02/28/20 1041    Clinical Impression Statement  Pt. with high reported LBP levels to start session today however was not limited by LBP with therex or transfer training during session.  Pt. behavior inconsistent with high reported pain levels.  STG #3 ongoing as pt. initially demonstrating poor safety awareness with poor hand placement with sit<>stand transfers with RW from chair.  Good carryover after initial safety instruction with transfer training however  pt. forgetting instruction for proper hand placement by end of session requiring cueing.   STG #2 partially achieved as pt. able to demo good technique of LE stretching with reference to HEP handout however at times requiring cueing for proper hold times.  Pt. verbalizing that he was pain free to end PT session.  Will continue to instruct pt. in proper transfer safety and HEP instruction prn in coming session.    Comorbidities  CVA x 2 with L hemiparesis and neurocognitive disorder, LBP with L LE radiculopathy, uncontrolled HTN, acute systolic heart failure d/t HTN, tachycardia, DM, R diabetic foot wound d/t ingrown toenail, AKI, ETOH & cocaine abuse, schizophrenia    Rehab Potential  Good    PT Treatment/Interventions  ADLs/Self Care Home Management;Cryotherapy;Electrical Stimulation;Moist Heat;Traction;Ultrasound;DME Instruction;Gait training;Stair training;Functional mobility training;Therapeutic activities;Therapeutic exercise;Balance training;Neuromuscular re-education;Patient/family education;Manual techniques;Passive range of motion;Dry needling;Energy conservation;Taping;Spinal Manipulations    PT Next Visit Plan  Further safety training with RW transfers; Lumbopelvic flexibility and strengthening; manual therapy and modalities PRN for pain    PT Home Exercise Plan  02/15/20 - pelvic tilt, SKTC &  KTOS piriformis stretches, LTR    Consulted and Agree with Plan of Care  Patient;Family member/caregiver    Family Member Consulted  cousin       Patient will benefit from skilled therapeutic intervention in order to improve the following deficits and impairments:  Abnormal gait, Cardiopulmonary status limiting activity, Decreased activity tolerance, Decreased balance, Decreased cognition, Decreased coordination, Decreased endurance, Decreased knowledge of precautions, Decreased knowledge of use of DME, Decreased mobility, Decreased range of motion, Decreased safety awareness, Decreased skin integrity, Decreased strength, Difficulty walking, Increased muscle spasms, Impaired perceived  functional ability, Impaired flexibility, Impaired sensation, Impaired tone, Improper body mechanics, Postural dysfunction, Pain  Visit Diagnosis: Chronic bilateral low back pain with left-sided sciatica  Hemiplegia and 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 Abnormal EKG 02/07/2020  . Left hip pain 02/07/2020  . AKI (acute kidney injury) ([place]) 02/07/2020  . Systolic heart failure secondary to hypertension ([place])   . Schizophrenia ([place])   . Acute systolic heart failure ([place]) 02/02/2018  . Chest pain 01/31/2018  . Hypokalemia   . Schizoaffective disorder, bipolar type ([place]) 11/22/2017  . Trauma   . Tachycardia 03/03/2017  . Alcohol abuse   . Cocaine abuse ([place])   . Hypertensive urgency   . Tachycardia   . Cocaine abuse with cocaine-induced mood disorder ([place]) 12/05/2016  . Cerebrovascular disease 09/02/2015  . 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 Malignant hypertension 08/30/2015  . Alcohol use disorder, severe, dependence ([place]) 08/30/2015  . Alcohol withdrawal ([place]) 08/30/2015  . Alcohol abuse with alcohol-induced mood disorder ([place]) 08/30/2015  . Thrombocytopenia ([place]) 08/27/2015  . Tobacco use disorder 08/26/2015  . Hyperlipidemia 04/28/2015  . Schizoaffective disorder, depressive type ([place])   . Gout 08/09/2013  . Diabetes mellitus ([place]) 07/09/2012    Bess Harvest, PTA 02/28/20 12:23 PM   [facility] [address]  [place], [place], [address] Phone: [phone]   Fax:  [phone]  Name: [name] MRN: [id number] Date of Birth: [birth year]

## 2020-03-02 ENCOUNTER — Other Ambulatory Visit: Payer: Self-pay

## 2020-03-02 ENCOUNTER — Encounter: Payer: Self-pay | Admitting: Physical Therapy

## 2020-03-02 ENCOUNTER — Ambulatory Visit: Payer: Medicaid Other | Admitting: Physical Therapy

## 2020-03-02 VITALS — BP 128/92 | HR 78

## 2020-03-02 DIAGNOSIS — R262 Difficulty in walking, not elsewhere classified: Secondary | ICD-10-CM

## 2020-03-02 DIAGNOSIS — M5442 Lumbago with sciatica, left side: Secondary | ICD-10-CM | POA: Diagnosis not present

## 2020-03-02 DIAGNOSIS — G8929 Other chronic pain: Secondary | ICD-10-CM

## 2020-03-02 DIAGNOSIS — Z9181 History of falling: Secondary | ICD-10-CM

## 2020-03-02 DIAGNOSIS — I69354 Hemiplegia and hemiparesis following cerebral infarction affecting left non-dominant side: Secondary | ICD-10-CM

## 2020-03-02 NOTE — Patient Instructions (Signed)
    Home exercise program created by Kailand Seda, PT.  For questions, please contact Jamina Macbeth via phone at [phone] or email at [email or web address]  [facility] [place] [facility] [address]  [address] [place], [place], [address] Phone: [phone]   Fax:  [phone]    

## 2020-03-02 NOTE — Therapy (Signed)
[facility] [address]  [place], [place], [address] Phone: [phone]   Fax:  [phone]  Physical Therapy Treatment  Patient Details  Name: [name] MRN: [id number] Date of Birth: Jun 23, 1961 Referring Provider (PT): Kerney Elbe, [place]   Encounter Date: 03/02/2020  PT End of Session - 03/02/20 1017    Visit Number  5    Number of Visits  16    Date for PT Re-Evaluation  04/11/20    Authorization Type  Medicaid    Authorization Time Period  03/02/20 - 04/11/20    Authorization - Visit Number  1    Authorization - Number of Visits  12    PT Start Time  1017    PT Stop Time  1105    PT Time Calculation (min)  48 min    Activity Tolerance  Patient tolerated treatment well   elevated BP   Behavior During Therapy  [facility] for tasks assessed/performed       Past Medical History:  Diagnosis Date  . Alcohol abuse   . Arthritis   . Crack cocaine use   . Depression   . Diabetes mellitus   . Drug abuse ([place])   . DTs (delirium tremens) ([place])    history of   . Gout   . Noncompliance with medication regimen   . Schizophrenia ([place])   . Stroke ([place])   . Systolic heart failure secondary to hypertension ([place])   . Uncontrolled hypertension     Past Surgical History:  Procedure Laterality Date  . LEFT HEART CATH AND CORONARY ANGIOGRAPHY N/A 02/03/2018   Procedure: LEFT HEART CATH AND CORONARY ANGIOGRAPHY;  Surgeon: Jettie Booze, MD;  Location: [facility];  Service: Cardiovascular;  Laterality: N/A;  . MANDIBLE RECONSTRUCTION    . TONSILLECTOMY      Vitals:   03/02/20 1021  BP: (!) 128/92  Pulse: 78  SpO2: 97%    Subjective Assessment - 03/02/20 1022    Subjective  Pt more alert today. Reports pain better - mosltly only in L hip today and only intermittently with getting up and moving around.    Pertinent History  CVA x 2 with L hemiparesis and neurocognitive disorder, LBP with L  LE radiculopathy, uncontrolled HTN, acute systolic heart failure d/t HTN, tachycardia, DM, R diabetic foot wound d/t ingrown toenail, AKI, ETOH & cocaine abuse, schizophrenia    Patient Stated Goals  "100% positive"    Currently in Pain?  No/denies    Pain Score  0-No pain   up to 8/10   Pain Location  Hip    Pain Orientation  Left    Pain Type  Chronic pain    Pain Onset  --    Pain Frequency  Intermittent                       [facility] Adult PT Treatment/Exercise - 03/02/20 1017      Transfers   Sit to Stand  5: Supervision;With upper extremity assist    Stand to Sit  5: Supervision;With upper extremity assist    Comments  pt more consistent with remembering to push up from seat for sit to stand transfers and achieving good alignment with chair/seat and reaching back w/o need for cues      Ambulation/Gait   Ambulation/Gait Assistance  5: Supervision    Ambulation Distance (Feet)  100 Feet    Assistive  device  Rolling walker    Gait Pattern  Step-through pattern;Decreased weight shift to left;Antalgic;Decreased hip/knee flexion - left;Decreased hip/knee flexion - right    Ambulation Surface  Level;Indoor    Gait Comments  better posture and RW proximity today but still slight favoring of L LE      Lumbar Exercises: Stretches   Passive Hamstring Stretch  Right;Left;30 seconds;2 reps    Passive Hamstring Stretch Limitations  seated hip hinge    Single Knee to Chest Stretch  Right;Left;30 seconds;2 reps    Single Knee to Chest Stretch Limitations  opp knee straight    Lower Trunk Rotation  20 seconds;2 reps    Piriformis Stretch  Right;Left;30 seconds;2 reps    Piriformis Stretch Limitations  supine KTOS      Lumbar Exercises: Aerobic   Nustep  L3 x 6 min (UE/LE)    seat #12     Lumbar Exercises: Supine   Pelvic Tilt  10 reps;5 seconds    Pelvic Tilt Limitations  cues to avoid holding breath    Clam  10 reps;3 seconds    Clam Limitations  yellow TB alt hip  ABD/ER    Bent Knee Raise  10 reps;3 seconds    Bent Knee Raise Limitations  yellow TB brace marching    Bridge Limitations  attempted with yellow TB hip abduction isometric but deferred d/t increased pain      Manual Therapy   Manual Therapy  Soft tissue mobilization;Myofascial release    Manual therapy comments  supine    Soft tissue mobilization  STM/DTM toL TLF and proximal ITB & quads    Myofascial Release  manual TPR and pin & stretch to L TLF and proximal ITB & quads             PT Education - 03/02/20 1105    Education Details  HEP update - seated HS stretch, abdominal bracing + hooklying yellow TB alt clam, yellow TB brace marching    Person(s) Educated  Patient    Methods  Explanation;Demonstration;Verbal cues;Tactile cues;Handout    Comprehension  Verbalized understanding;Returned demonstration;Verbal cues required;Tactile cues required;Need further instruction       PT Short Term Goals - 03/02/20 1105      PT SHORT TERM GOAL #1   Title  Complete balance and gait assessment once protective footwear available and establish LTGs as indicated    Status  Achieved   02/24/20     PT SHORT TERM GOAL #2   Title  Patient will be independent with initial HEP    Status  Achieved   03/02/20     PT SHORT TERM GOAL #3   Title  Patient will demonstrate safe sit to/from stand transfer technique with RW to reduce fall risk    Status  Achieved   03/02/20       PT Long Term Goals - 03/02/20 1135      PT LONG TERM GOAL #1   Title  Patient will be independent with ongoing/advanced HEP    Status  On-going    Target Date  04/11/20      PT LONG TERM GOAL #2   Title  Patient to report reduction in frequency and intensity of low back and LE radicular pain by >/= 50%    Status  On-going    Target Date  04/11/20      PT LONG TERM GOAL #3   Title  Patient will demonstrate improved B LE strength to >/=  4/5 to 4+/5 for improved stability and ease of mobility    Status  On-going     Target Date  04/11/20      PT LONG TERM GOAL #4   Title  Patient will ambulate >/= 500 ft with RW or LRAD demonstrating normal gait pattern and gait speed >/= 2.62 ft/sec to increase safety with community ambulation    Status  On-going    Target Date  04/11/20      PT LONG TERM GOAL #5   Title  Patient will improve Berg score to >/= 36/56 to improve safety stability with ADLs in standing and reduce risk for falls    Status  On-going    Target Date  04/11/20            Plan - 03/02/20 1105    Clinical Impression Statement  Jacy is more alert today with BP and pain better controlled allowing for better engagement with PT activities and better follow through with safety during transfers and gait. HEP reviewed with patient providing good return demonstration but vague in response to PT inquiry regarding frequency of performance at home - reinforced recommended frequency of 2x/day with patient acknowledging understanding. He was more consistent with transfers today, remembering to push up from seat for sit to stand transfers and achieving good alignment with chair/seat and reaching back during stand to sit w/o need for cues. All STGs now met. Introduced lumbopelvic strengthening today with patient demonstrating somewhat limited control on L with yellow TB resisted hooklying clams but stating he felt like this exercise was helping his hip, therefore provided HEP instructions for this along with brace marching and seated HS stretches. Pain briefly up to 8/10 during some transitional movements but otherwise well controlled and patient pain free at end of session, therefore modalities deferred.    Personal Factors and Comorbidities  Comorbidity 3+;Past/Current Experience;Behavior Pattern;Fitness;Time since onset of injury/illness/exacerbation;Transportation    Comorbidities  CVA x 2 with L hemiparesis and neurocognitive disorder, LBP with L LE radiculopathy, uncontrolled HTN, acute systolic  heart failure d/t HTN, tachycardia, DM, R diabetic foot wound d/t ingrown toenail, AKI, ETOH & cocaine abuse, schizophrenia    Examination-Activity Limitations  Bathing;Bed Mobility;Bend;Caring for Others;Carry;Continence;Dressing;Hygiene/Grooming;Lift;Locomotion Level;Sit;Sleep;Squat;Stairs;Stand;Toileting;Transfers    Examination-Participation Restrictions  Church;Cleaning;Community Activity;Driving;Interpersonal Relationship;Laundry;Medication Management;Meal Prep;Personal Finances;Shop;Volunteer    Rehab Potential  Good    PT Frequency  2x / week    PT Duration  8 weeks    PT Treatment/Interventions  ADLs/Self Care Home Management;Cryotherapy;Electrical Stimulation;Moist Heat;Traction;Ultrasound;DME Instruction;Gait training;Stair training;Functional mobility training;Therapeutic activities;Therapeutic exercise;Balance training;Neuromuscular re-education;Patient/family education;Manual techniques;Passive range of motion;Dry needling;Energy conservation;Taping;Spinal Manipulations    PT Next Visit Plan  Lumbopelvic flexibility and strengthening; manual therapy and modalities PRN for pain    PT Home Exercise Plan  02/15/20 - pelvic tilt, SKTC &  KTOS piriformis stretches, LTR; 03/02/20 - seated HS stretch, hooklying yellow TB alt clam, yellow TB brace marching    Consulted and Agree with Plan of Care  Patient;Family member/caregiver    Family Member Consulted  cousin       Patient will benefit from skilled therapeutic intervention in order to improve the following deficits and impairments:  Abnormal gait, Cardiopulmonary status limiting activity, Decreased activity tolerance, Decreased balance, Decreased cognition, Decreased coordination, Decreased endurance, Decreased knowledge of precautions, Decreased knowledge of use of DME, Decreased mobility, Decreased range of motion, Decreased safety awareness, Decreased skin integrity, Decreased strength, Difficulty walking, Increased muscle spasms, Impaired  perceived functional ability, Impaired flexibility, Impaired sensation, Impaired tone,  Improper body mechanics, Postural dysfunction, Pain  Visit Diagnosis: Chronic bilateral low back pain with left-sided sciatica  Hemiplegia and 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 Abnormal EKG 02/07/2020  . Left hip pain 02/07/2020  . AKI (acute kidney injury) ([place]) 02/07/2020  . Systolic heart failure secondary to hypertension ([place])   . Schizophrenia ([place])   . Acute systolic heart failure ([place]) 02/02/2018  . Chest pain 01/31/2018  . Hypokalemia   . Schizoaffective disorder, bipolar type ([place]) 11/22/2017  . Trauma   . Tachycardia 03/03/2017  . Alcohol abuse   . Cocaine abuse ([place])   . Hypertensive urgency   . Tachycardia   . Cocaine abuse with cocaine-induced mood disorder ([place]) 12/05/2016  . Cerebrovascular disease 09/02/2015  . 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 Malignant hypertension 08/30/2015  . Alcohol use disorder, severe, dependence ([place]) 08/30/2015  . Alcohol withdrawal ([place]) 08/30/2015  . Alcohol abuse with alcohol-induced mood disorder ([place]) 08/30/2015  . Thrombocytopenia ([place]) 08/27/2015  . Tobacco use disorder 08/26/2015  . Hyperlipidemia 04/28/2015  . Schizoaffective disorder, depressive type ([place])   . Gout 08/09/2013  . Diabetes mellitus ([place]) 07/09/2012    Percival Spanish, PT, MPT 03/02/2020, 11:39 AM  [facility] [address]  [place], [place], [address] Phone: [phone]   Fax:  [phone]  Name: Suvan Stcyr MRN: [id number] Date of Birth: 10-Apr-1961

## 2020-03-06 ENCOUNTER — Ambulatory Visit: Payer: Medicaid Other

## 2020-03-09 ENCOUNTER — Other Ambulatory Visit: Payer: Self-pay

## 2020-03-09 ENCOUNTER — Ambulatory Visit: Payer: Medicaid Other

## 2020-03-09 VITALS — BP 168/88 | HR 87

## 2020-03-09 DIAGNOSIS — R262 Difficulty in walking, not elsewhere classified: Secondary | ICD-10-CM

## 2020-03-09 DIAGNOSIS — Z9181 History of falling: Secondary | ICD-10-CM

## 2020-03-09 DIAGNOSIS — G8929 Other chronic pain: Secondary | ICD-10-CM

## 2020-03-09 DIAGNOSIS — I69354 Hemiplegia and hemiparesis following cerebral infarction affecting left non-dominant side: Secondary | ICD-10-CM

## 2020-03-09 DIAGNOSIS — M5442 Lumbago with sciatica, left side: Secondary | ICD-10-CM | POA: Diagnosis not present

## 2020-03-09 NOTE — Therapy (Signed)
[facility] [address]  [place], [place], [address] Phone: [phone]   Fax:  [phone]  Physical Therapy Treatment  Patient Details  Name: [name] MRN: [id number] Date of Birth: 10-19-1961 Referring Provider (PT): Kerney Elbe, [place]   Encounter Date: 03/09/2020  PT End of Session - 03/09/20 1021    Visit Number  6    Number of Visits  16    Date for PT Re-Evaluation  04/11/20    Authorization Type  Medicaid    Authorization Time Period  03/02/20 - 04/11/20    Authorization - Visit Number  2    Authorization - Number of Visits  12    PT Start Time  7106    PT Stop Time  1100    PT Time Calculation (min)  45 min    Activity Tolerance  Patient tolerated treatment well   elevated BP   Behavior During Therapy  [facility] for tasks assessed/performed       Past Medical History:  Diagnosis Date  . Alcohol abuse   . Arthritis   . Crack cocaine use   . Depression   . Diabetes mellitus   . Drug abuse ([place])   . DTs (delirium tremens) ([place])    history of   . Gout   . Noncompliance with medication regimen   . Schizophrenia ([place])   . Stroke ([place])   . Systolic heart failure secondary to hypertension ([place])   . Uncontrolled hypertension     Past Surgical History:  Procedure Laterality Date  . LEFT HEART CATH AND CORONARY ANGIOGRAPHY N/A 02/03/2018   Procedure: LEFT HEART CATH AND CORONARY ANGIOGRAPHY;  Surgeon: Jettie Booze, MD;  Location: [facility];  Service: Cardiovascular;  Laterality: N/A;  . MANDIBLE RECONSTRUCTION    . TONSILLECTOMY      Vitals:   03/09/20 1021  BP: (!) 168/88  Pulse: 87  SpO2: 97%    Subjective Assessment - 03/09/20 1022    Subjective  Pt. with no new complaints today.    Pertinent History  CVA x 2 with L hemiparesis and neurocognitive disorder, LBP with L LE radiculopathy, uncontrolled HTN, acute systolic heart failure d/t HTN, tachycardia, DM, R  diabetic foot wound d/t ingrown toenail, AKI, ETOH & cocaine abuse, schizophrenia    Patient Stated Goals  "100% positive"    Currently in Pain?  No/denies    Pain Score  0-No pain    Pain Location  Hip    Pain Orientation  Right;Lateral    Pain Descriptors / Indicators  Stabbing    Pain Type  Chronic pain    Pain Radiating Towards  notes today Pain radiating down R posterior/lateral thigh    Pain Onset  More than a month ago    Pain Frequency  Intermittent    Aggravating Factors   Unsure    Multiple Pain Sites  Yes    Pain Score  0   up to 8/10 last night in bed   Pain Location  Leg    Pain Orientation  Left;Lateral;Anterior    Pain Descriptors / Indicators  --   "cramp" "in a bind"   Aggravating Factors   last night in bed                       [facility] Adult PT Treatment/Exercise - 03/09/20 0001      Transfers   Sit to Stand  5: Supervision;With upper extremity assist    Sit to Stand Details  Verbal cues for sequencing   required cueing to proper hand placement    Stand to Sit  5: Supervision;With upper extremity assist   no VC's for proper hand placement stand>sit with RW req   Comments  --      Self-Care   Self-Care  Other Self-Care Comments    Other Self-Care Comments   Extended discussion with pt. regarding his current HEP to check for understanding and to monitor adherence; pt. vague with recall of activiites and ended discussion revealing pt. has lost his HEP handout;  re-printed full HEP handout and issued to pt.       Lumbar Exercises: Stretches   Passive Hamstring Stretch  Right;Left;30 seconds;2 reps    Passive Hamstring Stretch Limitations  Manual with therapist     Single Knee to Chest Stretch  Right;Left;30 seconds;1 rep    Single Knee to Chest Stretch Limitations  opp knee straight    Lower Trunk Rotation  20 seconds;2 reps    Lower Trunk Rotation Limitations  cues to avoid painful end rnage     Piriformis Stretch  Right;Left;30 seconds;2 reps     Piriformis Stretch Limitations  Manual with therapist      Lumbar Exercises: Aerobic   Nustep  L4 x 6 min (UE/LE)       Lumbar Exercises: Supine   Clam  10 reps;3 seconds    Clam Limitations  red TB alt hip ABD/ER               PT Short Term Goals - 03/09/20 1232      PT SHORT TERM GOAL #1   Title  Complete balance and gait assessment once protective footwear available and establish LTGs as indicated    Status  Achieved   02/24/20     PT SHORT TERM GOAL #2   Title  Patient will be independent with initial HEP    Status  Achieved   03/02/20     PT SHORT TERM GOAL #3   Title  Patient will demonstrate safe sit to/from stand transfer technique with RW to reduce fall risk    Status  Achieved   03/02/20       PT Long Term Goals - 03/02/20 1135      PT LONG TERM GOAL #1   Title  Patient will be independent with ongoing/advanced HEP    Status  On-going    Target Date  04/11/20      PT LONG TERM GOAL #2   Title  Patient to report reduction in frequency and intensity of low back and LE radicular pain by >/= 50%    Status  On-going    Target Date  04/11/20      PT LONG TERM GOAL #3   Title  Patient will demonstrate improved B LE strength to >/= 4/5 to 4+/5 for improved stability and ease of mobility    Status  On-going    Target Date  04/11/20      PT LONG TERM GOAL #4   Title  Patient will ambulate >/= 500 ft with RW or LRAD demonstrating normal gait pattern and gait speed >/= 2.62 ft/sec to increase safety with community ambulation    Status  On-going    Target Date  04/11/20      PT LONG TERM GOAL #5   Title  Patient will improve Berg score to >/= 36/56 to improve safety  stability with ADLs in standing and reduce risk for falls    Status  On-going    Target Date  04/11/20            Plan - 03/09/20 1022    Clinical Impression Statement  Pt. seen to start session with difficulty verbalizing pain location at times in session reporting R LE radiating pain  and other times L LE radiating pain.  Discussion with pt. revealing pt. lost HEP handout thus reprinted and re-issued entire HEP handout to pt.  focused session on gentle lumbopelvic strengthening and stretching.  Initial BP reading somewhat elevated however remained stable/therapeutic throughout session.  Ended session with p.t noting some reduction in pain.    Comorbidities  CVA x 2 with L hemiparesis and neurocognitive disorder, LBP with L LE radiculopathy, uncontrolled HTN, acute systolic heart failure d/t HTN, tachycardia, DM, R diabetic foot wound d/t ingrown toenail, AKI, ETOH & cocaine abuse, schizophrenia    Rehab Potential  Good    PT Treatment/Interventions  ADLs/Self Care Home Management;Cryotherapy;Electrical Stimulation;Moist Heat;Traction;Ultrasound;DME Instruction;Gait training;Stair training;Functional mobility training;Therapeutic activities;Therapeutic exercise;Balance training;Neuromuscular re-education;Patient/family education;Manual techniques;Passive range of motion;Dry needling;Energy conservation;Taping;Spinal Manipulations    PT Next Visit Plan  Lumbopelvic flexibility and strengthening; manual therapy and modalities PRN for pain    PT Home Exercise Plan  02/15/20 - pelvic tilt, SKTC &  KTOS piriformis stretches, LTR; 03/02/20 - seated HS stretch, hooklying yellow TB alt clam, yellow TB brace marching    Consulted and Agree with Plan of Care  Patient;Family member/caregiver    Family Member Consulted  cousin       Patient will benefit from skilled therapeutic intervention in order to improve the following deficits and impairments:  Abnormal gait, Cardiopulmonary status limiting activity, Decreased activity tolerance, Decreased balance, Decreased cognition, Decreased coordination, Decreased endurance, Decreased knowledge of precautions, Decreased knowledge of use of DME, Decreased mobility, Decreased range of motion, Decreased safety awareness, Decreased skin integrity, Decreased  strength, Difficulty walking, Increased muscle spasms, Impaired perceived functional ability, Impaired flexibility, Impaired sensation, Impaired tone, Improper body mechanics, Postural dysfunction, Pain  Visit Diagnosis: Chronic bilateral low back pain with left-sided sciatica  Hemiplegia and 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 Abnormal EKG 02/07/2020  . Left hip pain 02/07/2020  . AKI (acute kidney injury) ([place]) 02/07/2020  . Systolic heart failure secondary to hypertension ([place])   . Schizophrenia ([place])   . Acute systolic heart failure ([place]) 02/02/2018  . Chest pain 01/31/2018  . Hypokalemia   . Schizoaffective disorder, bipolar type ([place]) 11/22/2017  . Trauma   . Tachycardia 03/03/2017  . Alcohol abuse   . Cocaine abuse ([place])   . Hypertensive urgency   . Tachycardia   . Cocaine abuse with cocaine-induced mood disorder ([place]) 12/05/2016  . Cerebrovascular disease 09/02/2015  . 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 Malignant hypertension 08/30/2015  . Alcohol use disorder, severe, dependence ([place]) 08/30/2015  . Alcohol withdrawal ([place]) 08/30/2015  . Alcohol abuse with alcohol-induced mood disorder ([place]) 08/30/2015  . Thrombocytopenia ([place]) 08/27/2015  . Tobacco use disorder 08/26/2015  . Hyperlipidemia 04/28/2015  . Schizoaffective disorder, depressive type ([place])   . Gout 08/09/2013  . Diabetes mellitus ([place]) 07/09/2012    Bess Harvest, PTA 03/09/20 12:35 PM   [facility] [phone]  [address]  [place], [place], [address] Phone: [phone]   Fax:  [phone]  Name: [name] MRN: [id number] Date of Birth: 1961-06-12

## 2020-03-10 ENCOUNTER — Telehealth: Payer: Self-pay

## 2020-03-10 NOTE — Telephone Encounter (Signed)
NOTES ON FILE FROM [facility] [phone] SENT REFERRAL TO SCHEDULING

## 2020-03-13 ENCOUNTER — Other Ambulatory Visit: Payer: Self-pay

## 2020-03-13 ENCOUNTER — Ambulatory Visit: Payer: Medicaid Other

## 2020-03-13 VITALS — BP 182/68 | HR 93

## 2020-03-13 DIAGNOSIS — G8929 Other chronic pain: Secondary | ICD-10-CM

## 2020-03-13 DIAGNOSIS — M5442 Lumbago with sciatica, left side: Secondary | ICD-10-CM | POA: Diagnosis not present

## 2020-03-13 DIAGNOSIS — I69354 Hemiplegia and hemiparesis following cerebral infarction affecting left non-dominant side: Secondary | ICD-10-CM

## 2020-03-13 DIAGNOSIS — R262 Difficulty in walking, not elsewhere classified: Secondary | ICD-10-CM

## 2020-03-13 DIAGNOSIS — Z9181 History of falling: Secondary | ICD-10-CM

## 2020-03-13 NOTE — Therapy (Signed)
[facility] [address]  [place], [place], [address] Phone: [phone]   Fax:  [phone]  Physical Therapy Treatment  Patient Details  Name: [name] MRN: [id number] Date of Birth: 01/22/1961 Referring Provider (PT): Kerney Elbe, [place]   Encounter Date: 03/13/2020  PT End of Session - 03/13/20 1026    Visit Number  7    Number of Visits  16    Date for PT Re-Evaluation  04/11/20    Authorization Type  Medicaid    Authorization Time Period  03/02/20 - 04/11/20    Authorization - Visit Number  3    Authorization - Number of Visits  12    PT Start Time  1019    PT Stop Time  1102    PT Time Calculation (min)  43 min    Activity Tolerance  Patient tolerated treatment well   elevated BP   Behavior During Therapy  WFL for tasks assessed/performed       Past Medical History:  Diagnosis Date  . Alcohol abuse   . Arthritis   . Crack cocaine use   . Depression   . Diabetes mellitus   . Drug abuse ([place])   . DTs (delirium tremens) ([place])    history of   . Gout   . Noncompliance with medication regimen   . Schizophrenia ([place])   . Stroke ([place])   . Systolic heart failure secondary to hypertension ([place])   . Uncontrolled hypertension     Past Surgical History:  Procedure Laterality Date  . LEFT HEART CATH AND CORONARY ANGIOGRAPHY N/A 02/03/2018   Procedure: LEFT HEART CATH AND CORONARY ANGIOGRAPHY;  Surgeon: Jettie Booze, MD;  Location: [facility];  Service: Cardiovascular;  Laterality: N/A;  . MANDIBLE RECONSTRUCTION    . TONSILLECTOMY      Vitals:   03/13/20 1029 03/13/20 1050  BP: (!) 172/74 (!) 182/68  Pulse: 87 93  SpO2: 97% 97%    Subjective Assessment - 03/13/20 1029    Subjective  Notes increased L-sided hip pain today without known trigger.    Pertinent History  CVA x 2 with L hemiparesis and neurocognitive disorder, LBP with L LE radiculopathy, uncontrolled HTN,  acute systolic heart failure d/t HTN, tachycardia, DM, R diabetic foot wound d/t ingrown toenail, AKI, ETOH & cocaine abuse, schizophrenia    Patient Stated Goals  "100% positive"    Currently in Pain?  Yes    Pain Score  7     Pain Location  Hip    Pain Orientation  Lateral;Left    Pain Descriptors / Indicators  Sharp    Pain Type  Acute pain;Chronic pain    Pain Radiating Towards  denies pain today    Pain Onset  More than a month ago    Pain Frequency  Intermittent    Multiple Pain Sites  Yes                       [facility] Adult PT Treatment/Exercise - 03/13/20 0001      Transfers   Sit to Stand  5: Supervision;With upper extremity assist    Sit to Stand Details  Verbal cues for sequencing    Sit to Stand Details (indicate cue type and reason)  cues pt. for proper hand placement     Stand to Sit  5: Supervision;With upper extremity assist    Stand to Sit Details (  indicate cue type and reason)  Tactile cues for sequencing;Verbal cues for sequencing    Stand to Sit Details  Required cuieng for RW spacing from mat table       Ambulation/Gait   Ambulation/Gait  Yes    Ambulation/Gait Assistance  5: Supervision    Ambulation/Gait Assistance Details  Cues provided for upright posture, distance from RW (pt. encouraged to get closer to RW), even step-length     Ambulation Distance (Feet)  120 Feet    Assistive device  Rolling walker    Gait Pattern  Step-through pattern;Decreased weight shift to left;Antalgic    Ambulation Surface  Level;Indoor    Gait Comments  favoring L LE - pt. with complaint of L lateral hip pain with ambulating      Lumbar Exercises: Stretches   Passive Hamstring Stretch  Left;30 seconds;1 rep    Passive Hamstring Stretch Limitations  Manual with therapist     Single Knee to Chest Stretch  Left;1 rep;30 seconds    Single Knee to Chest Stretch Limitations  Manual with therapist     Other Lumbar Stretch Exercise  Manual L glute/figure-4 stretch with  therapist x 30 sec       Lumbar Exercises: Aerobic   Nustep  L4 x 6 min (UE/LE)       Manual Therapy   Manual Therapy  Soft tissue mobilization    Manual therapy comments  R sidlying with L LE elevated on bolster     Soft tissue mobilization  STM/DTM to L proximal ITB, L glutes.                 PT Short Term Goals - 03/09/20 1232      PT SHORT TERM GOAL #1   Title  Complete balance and gait assessment once protective footwear available and establish LTGs as indicated    Status  Achieved   02/24/20     PT SHORT TERM GOAL #2   Title  Patient will be independent with initial HEP    Status  Achieved   03/02/20     PT SHORT TERM GOAL #3   Title  Patient will demonstrate safe sit to/from stand transfer technique with RW to reduce fall risk    Status  Achieved   03/02/20       PT Long Term Goals - 03/02/20 1135      PT LONG TERM GOAL #1   Title  Patient will be independent with ongoing/advanced HEP    Status  On-going    Target Date  04/11/20      PT LONG TERM GOAL #2   Title  Patient to report reduction in frequency and intensity of low back and LE radicular pain by >/= 50%    Status  On-going    Target Date  04/11/20      PT LONG TERM GOAL #3   Title  Patient will demonstrate improved B LE strength to >/= 4/5 to 4+/5 for improved stability and ease of mobility    Status  On-going    Target Date  04/11/20      PT LONG TERM GOAL #4   Title  Patient will ambulate >/= 500 ft with RW or LRAD demonstrating normal gait pattern and gait speed >/= 2.62 ft/sec to increase safety with community ambulation    Status  On-going    Target Date  04/11/20      PT LONG TERM GOAL #5   Title  Patient will  improve Berg score to >/= 36/56 to improve safety stability with ADLs in standing and reduce risk for falls    Status  On-going    Target Date  04/11/20            Plan - 03/13/20 1040    Clinical Impression Statement  Pt. entering session today with complaint of  increased L hip/lateral thigh pain without known trigger which bothered him during the weekend.  Pt. seen to start session today with elevated BP however all other vitals WFL.  Closely monitored BP during session and focused on manual therapy for L hip/buttocks pain relief, manual LE stretching and RW safety with transfers and ambulation with RW.  Pt. noting improvement in pain levels and able to demo improved RW safety after transfer and ambulation instruction.  Does continue to verbalize poor recall and vague response when questioned about HEP adherence.  Ended visit with reduction in L hip pain and vitals WFL.    Comorbidities  CVA x 2 with L hemiparesis and neurocognitive disorder, LBP with L LE radiculopathy, uncontrolled HTN, acute systolic heart failure d/t HTN, tachycardia, DM, R diabetic foot wound d/t ingrown toenail, AKI, ETOH & cocaine abuse, schizophrenia    Rehab Potential  Good    PT Treatment/Interventions  ADLs/Self Care Home Management;Cryotherapy;Electrical Stimulation;Moist Heat;Traction;Ultrasound;DME Instruction;Gait training;Stair training;Functional mobility training;Therapeutic activities;Therapeutic exercise;Balance training;Neuromuscular re-education;Patient/family education;Manual techniques;Passive range of motion;Dry needling;Energy conservation;Taping;Spinal Manipulations    PT Next Visit Plan  Lumbopelvic flexibility and strengthening; manual therapy and modalities PRN for pain    PT Home Exercise Plan  02/15/20 - pelvic tilt, SKTC &  KTOS piriformis stretches, LTR; 03/02/20 - seated HS stretch, hooklying yellow TB alt clam, yellow TB brace marching    Consulted and Agree with Plan of Care  Patient;Family member/caregiver    Family Member Consulted  cousin       Patient will benefit from skilled therapeutic intervention in order to improve the following deficits and impairments:  Abnormal gait, Cardiopulmonary status limiting activity, Decreased activity tolerance, Decreased  balance, Decreased cognition, Decreased coordination, Decreased endurance, Decreased knowledge of precautions, Decreased knowledge of use of DME, Decreased mobility, Decreased range of motion, Decreased safety awareness, Decreased skin integrity, Decreased strength, Difficulty walking, Increased muscle spasms, Impaired perceived functional ability, Impaired flexibility, Impaired sensation, Impaired tone, Improper body mechanics, Postural dysfunction, Pain  Visit Diagnosis: Chronic bilateral low back pain with left-sided sciatica  Hemiplegia and 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 Abnormal EKG 02/07/2020  . Left hip pain 02/07/2020  . AKI (acute kidney injury) ([place]) 02/07/2020  . Systolic heart failure secondary to hypertension ([place])   . Schizophrenia ([place])   . Acute systolic heart failure ([place]) 02/02/2018  . Chest pain 01/31/2018  . Hypokalemia   . Schizoaffective disorder, bipolar type ([place]) 11/22/2017  . Trauma   . Tachycardia 03/03/2017  . Alcohol abuse   . Cocaine abuse ([place])   . Hypertensive urgency   . Tachycardia   . Cocaine abuse with cocaine-induced mood disorder ([place]) 12/05/2016  . Cerebrovascular disease 09/02/2015  . 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 Malignant hypertension 08/30/2015  . Alcohol use disorder, severe, dependence ([place]) 08/30/2015  . Alcohol withdrawal ([place]) 08/30/2015  . Alcohol abuse with alcohol-induced mood disorder ([place]) 08/30/2015  . Thrombocytopenia ([place]) 08/27/2015  .  Tobacco use disorder 08/26/2015  . Hyperlipidemia 04/28/2015  . Schizoaffective disorder, depressive type ([place])   . Gout 08/09/2013  . Diabetes mellitus ([place]) 07/09/2012    Bess Harvest, PTA 03/13/20 12:36 PM   [facility] [address]  [place], [place], [address] Phone: [phone]   Fax:  [phone]  Name: [name] MRN: [id number] Date of Birth: 04/17/1961

## 2020-03-16 ENCOUNTER — Other Ambulatory Visit: Payer: Self-pay

## 2020-03-16 ENCOUNTER — Ambulatory Visit: Payer: Medicaid Other

## 2020-03-16 VITALS — BP 158/60 | HR 87

## 2020-03-16 DIAGNOSIS — R262 Difficulty in walking, not elsewhere classified: Secondary | ICD-10-CM

## 2020-03-16 DIAGNOSIS — G8929 Other chronic pain: Secondary | ICD-10-CM

## 2020-03-16 DIAGNOSIS — I69354 Hemiplegia and hemiparesis following cerebral infarction affecting left non-dominant side: Secondary | ICD-10-CM

## 2020-03-16 DIAGNOSIS — M5442 Lumbago with sciatica, left side: Secondary | ICD-10-CM | POA: Diagnosis not present

## 2020-03-16 DIAGNOSIS — Z9181 History of falling: Secondary | ICD-10-CM

## 2020-03-16 NOTE — Therapy (Signed)
[facility] [address]  [place], [place], [address] Phone: [phone]   Fax:  [phone]  Physical Therapy Treatment  Patient Details  Name: [name] MRN: [id number] Date of Birth: Mar 29, 1961 Referring Provider (PT): Kerney Elbe, [place]   Encounter Date: 03/16/2020  PT End of Session - 03/16/20 1043    Visit Number  8    Number of Visits  16    Date for PT Re-Evaluation  04/11/20    Authorization Type  Medicaid    Authorization Time Period  03/02/20 - 04/11/20    Authorization - Visit Number  4    Authorization - Number of Visits  12    PT Start Time  1019    PT Stop Time  1108    PT Time Calculation (min)  49 min    Activity Tolerance  Patient tolerated treatment well   elevated BP   Behavior During Therapy  WFL for tasks assessed/performed       Past Medical History:  Diagnosis Date  . Alcohol abuse   . Arthritis   . Crack cocaine use   . Depression   . Diabetes mellitus   . Drug abuse ([place])   . DTs (delirium tremens) ([place])    history of   . Gout   . Noncompliance with medication regimen   . Schizophrenia ([place])   . Stroke ([place])   . Systolic heart failure secondary to hypertension ([place])   . Uncontrolled hypertension     Past Surgical History:  Procedure Laterality Date  . LEFT HEART CATH AND CORONARY ANGIOGRAPHY N/A 02/03/2018   Procedure: LEFT HEART CATH AND CORONARY ANGIOGRAPHY;  Surgeon: Jettie Booze, MD;  Location: [facility];  Service: Cardiovascular;  Laterality: N/A;  . MANDIBLE RECONSTRUCTION    . TONSILLECTOMY      Vitals:   03/16/20 1030  BP: (!) 158/60  Pulse: 87  SpO2: 98%    Subjective Assessment - 03/16/20 1130    Subjective  Notes improved L buttocks pain since last session.    Pertinent History  CVA x 2 with L hemiparesis and neurocognitive disorder, LBP with L LE radiculopathy, uncontrolled HTN, acute systolic heart failure d/t HTN,  tachycardia, DM, R diabetic foot wound d/t ingrown toenail, AKI, ETOH & cocaine abuse, schizophrenia    Patient Stated Goals  "100% positive"    Currently in Pain?  Yes    Pain Score  5     Pain Location  Buttocks    Pain Orientation  Left;Lateral    Pain Descriptors / Indicators  Sharp    Pain Type  Chronic pain    Pain Radiating Towards  notes L LE pain from hip to knee    Pain Onset  More than a month ago    Pain Frequency  Intermittent    Aggravating Factors   unsure    Pain Relieving Factors  Manual massage in session                       [facility] Adult PT Treatment/Exercise - 03/16/20 0001      Transfers   Sit to Stand  5: Supervision;With upper extremity assist    Sit to Stand Details  Verbal cues for sequencing    Sit to Stand Details (indicate cue type and reason)  cues proper hand positioning     Stand to Sit  5: Supervision;With upper extremity assist  Stand to Sit Details (indicate cue type and reason)  Verbal cues for safe use of DME/AE    Stand to Sit Details  required cueing for RW spacing from BOS and standing tall next to mat table before sitting       Ambulation/Gait   Ambulation/Gait  Yes    Ambulation/Gait Assistance  5: Supervision    Ambulation/Gait Assistance Details  Cues for upright posture and spacing of BOS from RW (pt. still with tendancy to lean forward some onto RW; Cues for even step-length with pt. able to self-correct for improved L weight shift and step length     Ambulation Distance (Feet)  300 Feet    Assistive device  Rolling walker    Gait Pattern  Step-through pattern;Decreased weight shift to left    Ambulation Surface  Level;Indoor    Gait velocity  2.04 ft/sec   in RW   Gait Comments  Favoring L LE initially however reported no pain and able to correct with cueing to even weight shift B and increased B step-length       Lumbar Exercises: Stretches   Passive Hamstring Stretch  Left;30 seconds;1 rep    Passive Hamstring  Stretch Limitations  Manual with therapist     Single Knee to Chest Stretch  Left;1 rep;30 seconds    Single Knee to Chest Stretch Limitations  Manual with therapist       Lumbar Exercises: Aerobic   Nustep  L4 x 6 min (UE/LE)       Lumbar Exercises: Seated   Sit to Stand  5 reps   2 sets - cues for leaning "nose over toes" anterior weight    Sit to Stand Limitations  pushoff from knees in RW from mat table       Lumbar Exercises: Supine   Straight Leg Raise  10 reps;3 seconds    Straight Leg Raises Limitations  L only       Knee/Hip Exercises: Standing   Hip Flexion  Right;Left;10 reps;Knee bent;Stengthening    Hip Flexion Limitations  RW    Hip Abduction  Right;Left;5 reps;Knee straight    Abduction Limitations  in RW       Manual Therapy   Manual Therapy  Soft tissue mobilization    Manual therapy comments  R sidlying with L LE elevated on bolster     Soft tissue mobilization  STM/DTM to L proximal ITB, L piriformis, L glute med - ttp in L piriformis, L glute med - improved after MT      Myofascial Release  TPR to R glute med, R TFL, R piriformis - pt. noted improved walking with RW comfort following MT               PT Short Term Goals - 03/09/20 1232      PT SHORT TERM GOAL #1   Title  Complete balance and gait assessment once protective footwear available and establish LTGs as indicated    Status  Achieved   02/24/20     PT SHORT TERM GOAL #2   Title  Patient will be independent with initial HEP    Status  Achieved   03/02/20     PT SHORT TERM GOAL #3   Title  Patient will demonstrate safe sit to/from stand transfer technique with RW to reduce fall risk    Status  Achieved   03/02/20       PT Long Term Goals - 03/16/20 1045  PT LONG TERM GOAL #1   Title  Patient will be independent with ongoing/advanced HEP    Status  On-going      PT LONG TERM GOAL #2   Title  Patient to report reduction in frequency and intensity of low back and LE radicular  pain by >/= 50%    Status  Partially Met   03/16/20:  45% improvement in LBP since starting therapy.  85% improvement in LE radicular pain     PT LONG TERM GOAL #3   Title  Patient will demonstrate improved B LE strength to >/= 4/5 to 4+/5 for improved stability and ease of mobility    Status  On-going      PT LONG TERM GOAL #4   Title  Patient will ambulate >/= 500 ft with RW or LRAD demonstrating normal gait pattern and gait speed >/= 2.62 ft/sec to increase safety with community ambulation    Status  Partially Met   03/16/20:  2.78f/sec with RW,  300 ft with RW before needing a rest break with improved weight shift     PT LONG TERM GOAL #5   Title  Patient will improve Berg score to >/= 36/56 to improve safety stability with ADLs in standing and reduce risk for falls    Status  On-going            Plan - 03/16/20 1055    Clinical Impression Statement  Doing well and notes improved pain levels since last visit.  Pt. making progress toward LTG #4.  demonstrating improved tolerance for walking distance of 300 ft in session today with RW and improved step length and gait speed with RW to 2.04 ft/sec.  Able to perform 5 reps sit<>stand from mat table to RW with only B UE pushoff from knees today which pt. expressed he was happy about.  Pt. notes 85% improvement in LE radicular pain since starting therapy and 45% improvement in LBP since starting therapy.  LTG #2 partially achieved.  Ended visit with pt. reporting he was pain free and denied LE fatigue.    Comorbidities  CVA x 2 with L hemiparesis and neurocognitive disorder, LBP with L LE radiculopathy, uncontrolled HTN, acute systolic heart failure d/t HTN, tachycardia, DM, R diabetic foot wound d/t ingrown toenail, AKI, ETOH & cocaine abuse, schizophrenia    Rehab Potential  Good    PT Frequency  2x / week    PT Treatment/Interventions  ADLs/Self Care Home Management;Cryotherapy;Electrical Stimulation;Moist Heat;Traction;Ultrasound;DME  Instruction;Gait training;Stair training;Functional mobility training;Therapeutic activities;Therapeutic exercise;Balance training;Neuromuscular re-education;Patient/family education;Manual techniques;Passive range of motion;Dry needling;Energy conservation;Taping;Spinal Manipulations    PT Next Visit Plan  Lumbopelvic flexibility and strengthening; manual therapy and modalities PRN for pain    PT Home Exercise Plan  02/15/20 - pelvic tilt, SKTC &  KTOS piriformis stretches, LTR; 03/02/20 - seated HS stretch, hooklying yellow TB alt clam, yellow TB brace marching    Consulted and Agree with Plan of Care  Patient;Family member/caregiver    Family Member Consulted  cousin       Patient will benefit from skilled therapeutic intervention in order to improve the following deficits and impairments:  Abnormal gait, Cardiopulmonary status limiting activity, Decreased activity tolerance, Decreased balance, Decreased cognition, Decreased coordination, Decreased endurance, Decreased knowledge of precautions, Decreased knowledge of use of DME, Decreased mobility, Decreased range of motion, Decreased safety awareness, Decreased skin integrity, Decreased strength, Difficulty walking, Increased muscle spasms, Impaired perceived functional ability, Impaired flexibility, Impaired sensation, Impaired tone, Improper body  mechanics, Postural dysfunction, Pain  Visit Diagnosis: Chronic bilateral low back pain with left-sided sciatica  Hemiplegia and 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 Abnormal EKG 02/07/2020  . Left hip pain 02/07/2020  . AKI (acute kidney injury) ([place]) 02/07/2020  . Systolic heart failure secondary to hypertension ([place])   . Schizophrenia ([place])   . Acute systolic heart failure ([place]) 02/02/2018  . Chest pain  01/31/2018  . Hypokalemia   . Schizoaffective disorder, bipolar type ([place]) 11/22/2017  . Trauma   . Tachycardia 03/03/2017  . Alcohol abuse   . Cocaine abuse ([place])   . Hypertensive urgency   . Tachycardia   . Cocaine abuse with cocaine-induced mood disorder ([place]) 12/05/2016  . Cerebrovascular disease 09/02/2015  . 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 Malignant hypertension 08/30/2015  . Alcohol use disorder, severe, dependence ([place]) 08/30/2015  . Alcohol withdrawal ([place]) 08/30/2015  . Alcohol abuse with alcohol-induced mood disorder ([place]) 08/30/2015  . Thrombocytopenia ([place]) 08/27/2015  . Tobacco use disorder 08/26/2015  . Hyperlipidemia 04/28/2015  . Schizoaffective disorder, depressive type ([place])   . Gout 08/09/2013  . Diabetes mellitus ([place]) 07/09/2012    Bess Harvest, PTA 03/16/20 11:32 AM    [facility] [address]  [place], [place], [address] Phone: [phone]   Fax:  [phone]  Name: [name] MRN: [id number] Date of Birth: [birth year]

## 2020-03-20 ENCOUNTER — Ambulatory Visit: Payer: Medicaid Other

## 2020-03-21 ENCOUNTER — Ambulatory Visit: Payer: Medicaid Other | Admitting: Orthopaedic Surgery

## 2020-03-23 ENCOUNTER — Encounter: Payer: Self-pay | Admitting: Physical Therapy

## 2020-03-23 ENCOUNTER — Other Ambulatory Visit: Payer: Self-pay

## 2020-03-23 ENCOUNTER — Ambulatory Visit: Payer: Medicaid Other | Attending: Internal Medicine | Admitting: Physical Therapy

## 2020-03-23 DIAGNOSIS — I69354 Hemiplegia and hemiparesis following cerebral infarction affecting left non-dominant side: Secondary | ICD-10-CM | POA: Diagnosis present

## 2020-03-23 DIAGNOSIS — G8929 Other chronic pain: Secondary | ICD-10-CM | POA: Diagnosis present

## 2020-03-23 DIAGNOSIS — M5442 Lumbago with sciatica, left side: Secondary | ICD-10-CM | POA: Insufficient documentation

## 2020-03-23 DIAGNOSIS — R262 Difficulty in walking, not elsewhere classified: Secondary | ICD-10-CM | POA: Insufficient documentation

## 2020-03-23 DIAGNOSIS — Z9181 History of falling: Secondary | ICD-10-CM | POA: Diagnosis present

## 2020-03-23 NOTE — Therapy (Signed)
[facility] [address]  [place], [place], [address] Phone: [phone]   Fax:  [phone]  Physical Therapy Treatment  Patient Details  Name: [name] MRN: [id number] Date of Birth: [birth year] Referring Provider (PT): Kerney Elbe, [place]   Encounter Date: 03/23/2020  PT End of Session - 03/23/20 1015    Visit Number  9    Number of Visits  16    Date for PT Re-Evaluation  04/11/20    Authorization Type  Medicaid    Authorization Time Period  03/02/20 - 04/11/20    Authorization - Visit Number  5    Authorization - Number of Visits  12    PT Start Time  7414    PT Stop Time  1102    PT Time Calculation (min)  47 min    Activity Tolerance  Patient tolerated treatment well   elevated BP   Behavior During Therapy  [facility] for tasks assessed/performed       Past Medical History:  Diagnosis Date  . Alcohol abuse   . Arthritis   . Crack cocaine use   . Depression   . Diabetes mellitus   . Drug abuse ([place])   . DTs (delirium tremens) ([place])    history of   . Gout   . Noncompliance with medication regimen   . Schizophrenia ([place])   . Stroke ([place])   . Systolic heart failure secondary to hypertension ([place])   . Uncontrolled hypertension     Past Surgical History:  Procedure Laterality Date  . LEFT HEART CATH AND CORONARY ANGIOGRAPHY N/A 02/03/2018   Procedure: LEFT HEART CATH AND CORONARY ANGIOGRAPHY;  Surgeon: Jettie Booze, MD;  Location: [facility];  Service: Cardiovascular;  Laterality: N/A;  . MANDIBLE RECONSTRUCTION    . TONSILLECTOMY      There were no vitals filed for this visit.  Subjective Assessment - 03/23/20 1019    Subjective  Pt reporting 9.5/10 pain in L hip but states "I can deal with it".    Pertinent History  CVA x 2 with L hemiparesis and neurocognitive disorder, LBP with L LE radiculopathy, uncontrolled HTN, acute systolic heart failure d/t HTN, tachycardia, DM,  R diabetic foot wound d/t ingrown toenail, AKI, ETOH & cocaine abuse, schizophrenia    Patient Stated Goals  "100% positive"    Currently in Pain?  Yes    Pain Score  9    9.5/10   Pain Location  Buttocks    Pain Orientation  Left    Pain Descriptors / Indicators  Sharp;Stabbing    Pain Type  Chronic pain    Pain Frequency  Intermittent    Pain Score  5    Pain Location  Leg    Pain Orientation  Left;Lower;Lateral    Pain Descriptors / Indicators  Aching    Pain Type  Chronic pain    Pain Frequency  Intermittent   typically only in weight bearing                      [facility] Adult PT Treatment/Exercise - 03/23/20 1015      Ambulation/Gait   Ambulation/Gait Assistance  5: Supervision    Ambulation/Gait Assistance Details  cues for upright posture and RW proximity targeting improved heel-toe porgression     Ambulation Distance (Feet)  100 Feet    Assistive device  Rolling walker    Gait Pattern  Step-through pattern;Antalgic;Decreased weight shift to left;Decreased dorsiflexion - left;Decreased dorsiflexion - right;Trunk flexed    Ambulation Surface  Level;Indoor      Lumbar Exercises: Stretches   Passive Hamstring Stretch  Left;30 seconds;1 rep    Passive Hamstring Stretch Limitations  seated hip hinge    Prone on Elbows Stretch  30 seconds;3 reps    Quadruped Mid Back Stretch  30 seconds;3 reps    Quadruped Mid Back Stretch Limitations  seated 3-way prayer stretch with green Pball   pt noting good relief   Piriformis Stretch  Left;30 seconds;1 rep    Piriformis Stretch Limitations  seated KTOS    Figure 4 Stretch  30 seconds;2 reps;Seated;With overpressure    Figure 4 Stretch Limitations  downward pressure on knee & seated hip hinge    Gastroc Stretch  Left;30 seconds;2 reps    Gastroc Stretch Limitations  seated with strap & standing at wall      Lumbar Exercises: Aerobic   Nustep  L4 x 6 min (UE/LE)       Lumbar Exercises: Prone   Other Prone Lumbar  Exercises  prone lying 3 x 60"      Lumbar Exercises: Quadruped   Mad[place]/Old Horse  10 reps      Knee/Hip Exercises: Standing   Hip Flexion  Right;Left;10 reps;Stengthening;Knee bent    Hip Flexion Limitations  RW for support/balance    Hip Abduction  Right;Left;5 reps;Knee straight    Abduction Limitations  RW for support/balance    Hip Extension  Right;Left;5 reps;Knee straight    Extension Limitations  RW for support/balance      Manual Therapy   Manual Therapy  Joint mobilization;Soft tissue mobilization;Myofascial release    Manual therapy comments  prone    Joint Mobilization  Lumbar CPAs in prone and slight extension POE    Soft tissue mobilization  STM/DTM to B lumbar paraspiinals, L glutes and piriformis - pt localizing ttp to L glutes and piriformis    Myofascial Release  manual TPR to L glute med/min & piriformis, pin & stretch to B lumbar paraspinals - relief of pain reported with manual DTM/TPR               PT Short Term Goals - 03/09/20 1232      PT SHORT TERM GOAL #1   Title  Complete balance and gait assessment once protective footwear available and establish LTGs as indicated    Status  Achieved   02/24/20     PT SHORT TERM GOAL #2   Title  Patient will be independent with initial HEP    Status  Achieved   03/02/20     PT SHORT TERM GOAL #3   Title  Patient will demonstrate safe sit to/from stand transfer technique with RW to reduce fall risk    Status  Achieved   03/02/20       PT Long Term Goals - 03/16/20 1045      PT LONG TERM GOAL #1   Title  Patient will be independent with ongoing/advanced HEP    Status  On-going      PT LONG TERM GOAL #2   Title  Patient to report reduction in frequency and intensity of low back and LE radicular pain by >/= 50%    Status  Partially Met   03/16/20:  45% improvement in LBP since starting therapy.  85% improvement in LE radicular pain     PT LONG TERM GOAL #3  Title  Patient will demonstrate improved B  LE strength to >/= 4/5 to 4+/5 for improved stability and ease of mobility    Status  On-going      PT LONG TERM GOAL #4   Title  Patient will ambulate >/= 500 ft with RW or LRAD demonstrating normal gait pattern and gait speed >/= 2.62 ft/sec to increase safety with community ambulation    Status  Partially Met   03/16/20:  2.79f/sec with RW,  300 ft with RW before needing a rest break with improved weight shift     PT LONG TERM GOAL #5   Title  Patient will improve Berg score to >/= 36/56 to improve safety stability with ADLs in standing and reduce risk for falls    Status  On-going            Plan - 03/23/20 1022    Clinical Impression Statement  CErveyreporting increased L hip/buttock pain again today although tolerable by his report despite 9.5/10 pain rating. Worked on lumbar mobility and STM/stretching to address pain and increased muscle tension/tightness with pt reporting improvement in pain by end of session. He will continue to benefit from lumbopelvic strengthening to improve core stability for decreased pain and improved balance and gait stability.    Personal Factors and Comorbidities  Comorbidity 3+;Past/Current Experience;Behavior Pattern;Fitness;Time since onset of injury/illness/exacerbation;Transportation    Comorbidities  CVA x 2 with L hemiparesis and neurocognitive disorder, LBP with L LE radiculopathy, uncontrolled HTN, acute systolic heart failure d/t HTN, tachycardia, DM, R diabetic foot wound d/t ingrown toenail, AKI, ETOH & cocaine abuse, schizophrenia    Examination-Activity Limitations  Bathing;Bed Mobility;Bend;Caring for Others;Carry;Continence;Dressing;Hygiene/Grooming;Lift;Locomotion Level;Sit;Sleep;Squat;Stairs;Stand;Toileting;Transfers    Examination-Participation Restrictions  Church;Cleaning;Community Activity;Driving;Interpersonal Relationship;Laundry;Medication Management;Meal Prep;Personal Finances;Shop;Volunteer    Rehab Potential  Good    PT  Frequency  2x / week    PT Duration  8 weeks    PT Treatment/Interventions  ADLs/Self Care Home Management;Cryotherapy;Electrical Stimulation;Moist Heat;Traction;Ultrasound;DME Instruction;Gait training;Stair training;Functional mobility training;Therapeutic activities;Therapeutic exercise;Balance training;Neuromuscular re-education;Patient/family education;Manual techniques;Passive range of motion;Dry needling;Energy conservation;Taping;Spinal Manipulations    PT Next Visit Plan  Lumbopelvic flexibility and strengthening; manual therapy and modalities PRN for pain    PT Home Exercise Plan  02/15/20 - pelvic tilt, SKTC &  KTOS piriformis stretches, LTR; 03/02/20 - seated HS stretch, hooklying yellow TB alt clam, yellow TB brace marching    Consulted and Agree with Plan of Care  Patient       Patient will benefit from skilled therapeutic intervention in order to improve the following deficits and impairments:  Abnormal gait, Cardiopulmonary status limiting activity, Decreased activity tolerance, Decreased balance, Decreased cognition, Decreased coordination, Decreased endurance, Decreased knowledge of precautions, Decreased knowledge of use of DME, Decreased mobility, Decreased range of motion, Decreased safety awareness, Decreased skin integrity, Decreased strength, Difficulty walking, Increased muscle spasms, Impaired perceived functional ability, Impaired flexibility, Impaired sensation, Impaired tone, Improper body mechanics, Postural dysfunction, Pain  Visit Diagnosis: Chronic bilateral low back pain with left-sided sciatica  Hemiplegia and 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 Abnormal EKG 02/07/2020  . Left hip pain 02/07/2020  . AKI (acute kidney injury) ([place] 02/07/2020  . Systolic heart failure  secondary to hypertension ([place]   . Schizophrenia ([place]   . Acute systolic heart failure ([place] 02/02/2018  . Chest pain 01/31/2018  .  Hypokalemia   . Schizoaffective disorder, bipolar type ([place]) 11/22/2017  . Trauma   . Tachycardia 03/03/2017  . Alcohol abuse   . Cocaine abuse ([place])   . Hypertensive urgency   . Tachycardia   . Cocaine abuse with cocaine-induced mood disorder ([place]) 12/05/2016  . Cerebrovascular disease 09/02/2015  . 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 Malignant hypertension 08/30/2015  . Alcohol use disorder, severe, dependence ([place]) 08/30/2015  . Alcohol withdrawal ([place]) 08/30/2015  . Alcohol abuse with alcohol-induced mood disorder ([place]) 08/30/2015  . Thrombocytopenia ([place]) 08/27/2015  . Tobacco use disorder 08/26/2015  . Hyperlipidemia 04/28/2015  . Schizoaffective disorder, depressive type ([place])   . Gout 08/09/2013  . Diabetes mellitus ([place]) 07/09/2012    Percival Spanish, PT, MPT 03/23/2020, 12:34 PM  [facility] [address]  [place], [place], [address] Phone: [phone]   Fax:  [phone]  Name: [name] MRN: [id number] Date of Birth: [birth year]

## 2020-03-27 ENCOUNTER — Other Ambulatory Visit: Payer: Self-pay

## 2020-03-27 ENCOUNTER — Ambulatory Visit: Payer: Medicaid Other

## 2020-03-27 DIAGNOSIS — R262 Difficulty in walking, not elsewhere classified: Secondary | ICD-10-CM

## 2020-03-27 DIAGNOSIS — Z9181 History of falling: Secondary | ICD-10-CM

## 2020-03-27 DIAGNOSIS — G8929 Other chronic pain: Secondary | ICD-10-CM

## 2020-03-27 DIAGNOSIS — M5442 Lumbago with sciatica, left side: Secondary | ICD-10-CM | POA: Diagnosis not present

## 2020-03-27 DIAGNOSIS — I69354 Hemiplegia and hemiparesis following cerebral infarction affecting left non-dominant side: Secondary | ICD-10-CM

## 2020-03-27 NOTE — Therapy (Addendum)
[facility] [address]  [name], [place], [address] Phone: [phone]   Fax:  [phone]  Physical Therapy Treatment  Patient Details  Name: [name] MRN: [id number] Date of Birth: [birth year] Referring Provider (PT): Kerney Elbe, [place]   Encounter Date: 03/27/2020  PT End of Session - 03/27/20 1027    Visit Number  10    Number of Visits  16    Date for PT Re-Evaluation  04/11/20    Authorization Type  Medicaid    Authorization Time Period  03/02/20 - 04/11/20    Authorization - Visit Number  6    Authorization - Number of Visits  12    PT Start Time  1019    PT Stop Time  1115    PT Time Calculation (min)  56 min    Activity Tolerance  Patient tolerated treatment well   elevated BP   Behavior During Therapy  [facility] for tasks assessed/performed       Past Medical History:  Diagnosis Date  . Alcohol abuse   . Arthritis   . Crack cocaine use   . Depression   . Diabetes mellitus   . Drug abuse ([place])   . DTs (delirium tremens) ([place])    history of   . Gout   . Noncompliance with medication regimen   . Schizophrenia ([place])   . Stroke ([place])   . Systolic heart failure secondary to hypertension ([place])   . Uncontrolled hypertension     Past Surgical History:  Procedure Laterality Date  . LEFT HEART CATH AND CORONARY ANGIOGRAPHY N/A 02/03/2018   Procedure: LEFT HEART CATH AND CORONARY ANGIOGRAPHY;  Surgeon: Jettie Booze, MD;  Location: [facility];  Service: Cardiovascular;  Laterality: N/A;  . MANDIBLE RECONSTRUCTION    . TONSILLECTOMY      There were no vitals filed for this visit.  Subjective Assessment - 03/27/20 1159    Subjective  Pt. reporting he has not been walking with his RW around home.    Pertinent History  CVA x 2 with L hemiparesis and neurocognitive disorder, LBP with L LE radiculopathy, uncontrolled HTN, acute systolic heart failure d/t HTN, tachycardia, DM, R  diabetic foot wound d/t ingrown toenail, AKI, ETOH & cocaine abuse, schizophrenia    Patient Stated Goals  "100% positive"    Currently in Pain?  Yes    Pain Score  8     Pain Location  Buttocks    Pain Orientation  Left    Pain Descriptors / Indicators  Sharp    Pain Type  Chronic pain    Pain Radiating Towards  notes L LE pain from hip to knee         [facility] PT Assessment - 03/27/20 0001      Strength   Strength Assessment Site  Hip;Knee    Right/Left Hip  Right;Left    Right Hip Flexion  4/5    Right Hip Extension  4/5    Right Hip External Rotation   4+/5    Right Hip Internal Rotation  4+/5    Right Hip ABduction  4/5   L hip pain laying on side    Right Hip ADduction  4/5    Left Hip Flexion  4/5    Left Hip Extension  4/5    Left Hip External Rotation  4/5    Left Hip Internal Rotation  4/5    Left  Hip ABduction  4/5   L hip pain    Left Hip ADduction  4/5    Right/Left Knee  Right;Left    Right Knee Flexion  4+/5    Right Knee Extension  4+/5    Left Knee Flexion  4+/5    Left Knee Extension  4+/5    Right/Left Ankle  Right;Left    Right Ankle Dorsiflexion  4+/5    Left Ankle Dorsiflexion  4+/5      Berg Balance Test   Sit to Stand  Able to stand without using hands and stabilize independently    Standing Unsupported  Able to stand 30 seconds unsupported    Sitting with Back Unsupported but Feet Supported on Floor or Stool  Able to sit safely and securely 2 minutes    Stand to Sit  Sits safely with minimal use of hands    Transfers  Able to transfer with verbal cueing and /or supervision    Standing Unsupported with Eyes Closed  Able to stand 10 seconds with supervision    Standing Unsupported with Feet Together  Able to place feet together independently and stand for 1 minute with supervision    From Standing, Reach Forward with Outstretched Arm  Can reach forward >5 cm safely (2")    From Standing Position, Pick up Object from Floor  Able to pick up shoe,  needs supervision    From Standing Position, Turn to Look Behind Over each Shoulder  Needs supervision when turning    Turn 360 Degrees  Needs assistance while turning    Standing Unsupported, Alternately Place Feet on Step/Stool  Able to complete >2 steps/needs minimal assist    Standing Unsupported, One Foot in Front  Needs help to step but can hold 15 seconds    Standing on One Leg  Unable to try or needs assist to prevent fall    Total Score  30                   [facility] Adult PT Treatment/Exercise - 03/27/20 0001      Ambulation/Gait   Ambulation/Gait  Yes    Ambulation/Gait Assistance  5: Supervision    Ambulation/Gait Assistance Details  pt. with L mod antalgic gait and complaint of L-sided buttocks pain; cues provided for upright posture, BOS in RW, and increased B step-length with RW    Ambulation Distance (Feet)  500 Feet    Assistive device  Rolling walker    Gait Pattern  Step-through pattern;Antalgic;Decreased weight shift to left;Decreased dorsiflexion - left;Decreased dorsiflexion - right;Trunk flexed    Ambulation Surface  Level;Indoor    Gait velocity  2.38 ft/sec   with RW     Lumbar Exercises: Stretches   Piriformis Stretch  Left;30 seconds;2 reps    Piriformis Stretch Limitations  seated KTOS    Figure 4 Stretch  30 seconds;2 reps;Seated;With overpressure    Figure 4 Stretch Limitations  seated       Lumbar Exercises: Aerobic   Nustep  L4 x 6 min (UE/LE)       Manual Therapy   Manual Therapy  Soft tissue mobilization    Manual therapy comments  seated     Soft tissue mobilization  STM to L lateral posterior buttocks with pt. sitting and leaning to R - pt. reporting relief from this              PT Education - 03/27/20 1308    Education Details  HEP update;  seated KTOS and figure-4 glute/piriformis stretches    Person(s) Educated  Patient    Methods  Explanation;Demonstration;Verbal cues;Handout    Comprehension  Verbalized  understanding;Returned demonstration       PT Short Term Goals - 03/09/20 1232      PT SHORT TERM GOAL #1   Title  Complete balance and gait assessment once protective footwear available and establish LTGs as indicated    Status  Achieved   02/24/20     PT SHORT TERM GOAL #2   Title  Patient will be independent with initial HEP    Status  Achieved   03/02/20     PT SHORT TERM GOAL #3   Title  Patient will demonstrate safe sit to/from stand transfer technique with RW to reduce fall risk    Status  Achieved   03/02/20       PT Long Term Goals - 03/27/20 1028      PT LONG TERM GOAL #1   Title  Patient will be independent with ongoing/advanced HEP    Status  Partially Met      PT LONG TERM GOAL #2   Title  Patient to report reduction in frequency and intensity of low back and LE radicular pain by >/= 50%    Status  Partially Met   03/27/20  48% improvement in LBP since starting therapy.  85% improvement in LE radicular pain     PT LONG TERM GOAL #3   Title  Patient will demonstrate improved B LE strength to >/= 4/5 to 4+/5 for improved stability and ease of mobility    Status  Achieved      PT LONG TERM GOAL #4   Title  Patient will ambulate >/= 500 ft with RW or LRAD demonstrating normal gait pattern and gait speed >/= 2.62 ft/sec to increase safety with community ambulation    Status  Partially Met   03/27/20:  2.58f/sec with RW, 500 ft with RW however with L antalgic gait with RW and complaint of L 8/10 buttocks pain     PT LONG TERM GOAL #5   Title  Patient will improve Berg score to >/= 36/56 to improve safety stability with ADLs in standing and reduce risk for falls    Status  Partially Met   03/27/20:  30/56           Plan - 03/27/20 1108    Clinical Impression Statement  Pt. has made good progress with physical therapy.  Primary complaint today was L posterior/lateral buttocks pain with L LE radicular pain from hip to lateral knee.  Pt. noting some  improvement with Manual massage to buttocks/piriformis today however with intermittent complaint throughout session.  Pt. noting 48% reduction in LBP since starting therapy and 85% reduction in LE radicular pain since starting therapy.  LTG #2 partially achieved.  LTG #3 achieved as pt. able to demo B LE strength of 4/5-4+/5 however with some complaint of L buttocks pain on resisted positioning.  LTG #4 partially achieved as pt. able to ambulate 500 ft with RW however with L antalgic and complaint of L buttocks pain.  Did require occasional cueing for BOS proximity from RW.  Able to demo improved gait speed of 2.375fsec with RW just short of goal.  BERG Balance testing demonstrated score of 30/56 improved from 11/56 score from when originally tested demonstrating reduce risk of falls.  LTG #5 partially achieved.  Pt. will continue to benefit from  further skilled therapy to improve balance and functional mobility.    Comorbidities  CVA x 2 with L hemiparesis and neurocognitive disorder, LBP with L LE radiculopathy, uncontrolled HTN, acute systolic heart failure d/t HTN, tachycardia, DM, R diabetic foot wound d/t ingrown toenail, AKI, ETOH & cocaine abuse, schizophrenia    Rehab Potential  Good    PT Treatment/Interventions  ADLs/Self Care Home Management;Cryotherapy;Electrical Stimulation;Moist Heat;Traction;Ultrasound;DME Instruction;Gait training;Stair training;Functional mobility training;Therapeutic activities;Therapeutic exercise;Balance training;Neuromuscular re-education;Patient/family education;Manual techniques;Passive range of motion;Dry needling;Energy conservation;Taping;Spinal Manipulations    PT Next Visit Plan  Lumbopelvic flexibility and strengthening; manual therapy and modalities PRN for pain    PT Home Exercise Plan  02/15/20 - pelvic tilt, SKTC &  KTOS piriformis stretches, LTR; 03/02/20 - seated HS stretch, hooklying yellow TB alt clam, yellow TB brace marching; 03/27/20 - seated KTOS and  figure-4 glute/piriformis stretches    Consulted and Agree with Plan of Care  Patient    Family Member Consulted  cousin       Patient will benefit from skilled therapeutic intervention in order to improve the following deficits and impairments:  Abnormal gait, Cardiopulmonary status limiting activity, Decreased activity tolerance, Decreased balance, Decreased cognition, Decreased coordination, Decreased endurance, Decreased knowledge of precautions, Decreased knowledge of use of DME, Decreased mobility, Decreased range of motion, Decreased safety awareness, Decreased skin integrity, Decreased strength, Difficulty walking, Increased muscle spasms, Impaired perceived functional ability, Impaired flexibility, Impaired sensation, Impaired tone, Improper body mechanics, Postural dysfunction, Pain  Visit Diagnosis: Chronic bilateral low back pain with left-sided sciatica  Hemiplegia and 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 Abnormal EKG 02/07/2020  . Left hip pain 02/07/2020  . AKI (acute kidney injury) ([place]) 02/07/2020  . Systolic heart failure secondary to hypertension ([place])   . Schizophrenia ([place])   . Acute systolic heart failure ([place]) 02/02/2018  . Chest pain 01/31/2018  . Hypokalemia   . Schizoaffective disorder, bipolar type ([place]) 11/22/2017  . Trauma   . Tachycardia 03/03/2017  . Alcohol abuse   . Cocaine abuse ([place])   . Hypertensive urgency   . Tachycardia   . Cocaine abuse with cocaine-induced mood disorder ([place]) 12/05/2016  . Cerebrovascular disease 09/02/2015  . 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 Malignant hypertension 08/30/2015  . Alcohol use disorder, severe, dependence ([place]) 08/30/2015  . Alcohol withdrawal ([place]) 08/30/2015  .  Alcohol abuse with alcohol-induced mood disorder ([place]) 08/30/2015  . Thrombocytopenia ([place]) 08/27/2015  . Tobacco use disorder 08/26/2015  . Hyperlipidemia 04/28/2015  . Schizoaffective disorder, depressive type ([place])   . Gout 08/09/2013  . Diabetes mellitus ([place]) 07/09/2012    Bess Harvest, PTA 03/27/20 1:10 PM   [facility] [address]  [place], [place], [address] Phone: [phone]   Fax:  [phone]  Name: [name] MRN: [id number] Date of Birth: 1961/03/15

## 2020-03-29 ENCOUNTER — Encounter: Payer: Self-pay | Admitting: General Practice

## 2020-03-30 ENCOUNTER — Encounter: Payer: Self-pay | Admitting: Physical Therapy

## 2020-03-30 ENCOUNTER — Other Ambulatory Visit: Payer: Self-pay

## 2020-03-30 ENCOUNTER — Ambulatory Visit: Payer: Medicaid Other | Admitting: Physical Therapy

## 2020-03-30 VITALS — BP 144/100 | HR 88

## 2020-03-30 DIAGNOSIS — I69354 Hemiplegia and hemiparesis following cerebral infarction affecting left non-dominant side: Secondary | ICD-10-CM

## 2020-03-30 DIAGNOSIS — Z9181 History of falling: Secondary | ICD-10-CM

## 2020-03-30 DIAGNOSIS — R262 Difficulty in walking, not elsewhere classified: Secondary | ICD-10-CM

## 2020-03-30 DIAGNOSIS — M5442 Lumbago with sciatica, left side: Secondary | ICD-10-CM | POA: Diagnosis not present

## 2020-03-30 DIAGNOSIS — G8929 Other chronic pain: Secondary | ICD-10-CM

## 2020-03-30 NOTE — Therapy (Signed)
[facility] [address]  [place], [place], [address] Phone: [phone]   Fax:  [phone]  Physical Therapy Treatment  Patient Details  Name: [name] MRN: [id number] Date of Birth: 1961/05/31 Referring Provider (PT): Kerney Elbe, [place]   Encounter Date: 03/30/2020  PT End of Session - 03/30/20 1021    Visit Number  11    Number of Visits  16    Date for PT Re-Evaluation  04/11/20    Authorization Type  Medicaid    Authorization Time Period  03/02/20 - 04/11/20    Authorization - Visit Number  7    Authorization - Number of Visits  12    PT Start Time  1771    PT Stop Time  1117    PT Time Calculation (min)  56 min    Activity Tolerance  Patient tolerated treatment well   elevated BP   Behavior During Therapy  [facility] for tasks assessed/performed       Past Medical History:  Diagnosis Date  . Alcohol abuse   . Arthritis   . Crack cocaine use   . Depression   . Diabetes mellitus   . Drug abuse ([place])   . DTs (delirium tremens) ([place])    history of   . Gout   . Noncompliance with medication regimen   . Schizophrenia ([place])   . Stroke ([place])   . Systolic heart failure secondary to hypertension ([place])   . Uncontrolled hypertension     Past Surgical History:  Procedure Laterality Date  . LEFT HEART CATH AND CORONARY ANGIOGRAPHY N/A 02/03/2018   Procedure: LEFT HEART CATH AND CORONARY ANGIOGRAPHY;  Surgeon: Jettie Booze, MD;  Location: [facility];  Service: Cardiovascular;  Laterality: N/A;  . MANDIBLE RECONSTRUCTION    . TONSILLECTOMY      Vitals:   03/30/20 1022  BP: (!) 144/100  Pulse: 88  SpO2: 96%    Subjective Assessment - 03/30/20 1031    Subjective  Pt reporting pain in L hip/low back worse today - not sure if he has been taking his medication.    Pertinent History  CVA x 2 with L hemiparesis and neurocognitive disorder, LBP with L LE radiculopathy, uncontrolled  HTN, acute systolic heart failure d/t HTN, tachycardia, DM, R diabetic foot wound d/t ingrown toenail, AKI, ETOH & cocaine abuse, schizophrenia    Patient Stated Goals  "100% positive"    Currently in Pain?  Yes    Pain Score  10-Worst pain ever   "11-15/10"   Pain Location  Buttocks    Pain Orientation  Left    Pain Descriptors / Indicators  Sharp    Pain Type  Chronic pain    Pain Frequency  Intermittent                        [facility] Adult PT Treatment/Exercise - 03/30/20 1021      Lumbar Exercises: Stretches   Piriformis Stretch  Left;30 seconds;2 reps    Piriformis Stretch Limitations  supine KTOS    Figure 4 Stretch  30 seconds;2 reps;With overpressure;Supine    Figure 4 Stretch Limitations  single leg to chest      Lumbar Exercises: Aerobic   Nustep  L4 x 6 min (UE/LE)    seat #12     Lumbar Exercises: Supine   Clam  10 reps;3 seconds    Clam Limitations  alt hip ABD/ER - limited control    Bent Knee Raise  10 reps;3 seconds    Bent Knee Raise Limitations  red TB brace marching    Bridge  5 reps;3 seconds    Bridge Limitations  + red TB hip ABD isometric - limited lift      Modalities   Modalities  Electrical Stimulation;Moist Heat      Moist Heat Therapy   Number Minutes Moist Heat  10 Minutes    Moist Heat Location  Lumbar Spine;Hip   low back/buttocks     Electrical Stimulation   Electrical Stimulation Location  L glutes/piriformis    Electrical Stimulation Action  IFC    Electrical Stimulation Parameters  80-150 Hz, intensity to pt tol x 10'    Electrical Stimulation Goals  Pain;Tone      Manual Therapy   Manual Therapy  Soft tissue mobilization;Myofascial release    Manual therapy comments  R side lying with L LE supported on bolster    Soft tissue mobilization  STM/DTM to L lumbar paraspinals, glutes and piriformis - pt localizing ttp to L piriformis    Myofascial Release  manual TPR to L piriformis - relief of pain reported with manual  DTM/TPR               PT Short Term Goals - 03/09/20 1232      PT SHORT TERM GOAL #1   Title  Complete balance and gait assessment once protective footwear available and establish LTGs as indicated    Status  Achieved   02/24/20     PT SHORT TERM GOAL #2   Title  Patient will be independent with initial HEP    Status  Achieved   03/02/20     PT SHORT TERM GOAL #3   Title  Patient will demonstrate safe sit to/from stand transfer technique with RW to reduce fall risk    Status  Achieved   03/02/20       PT Long Term Goals - 03/27/20 1028      PT LONG TERM GOAL #1   Title  Patient will be independent with ongoing/advanced HEP    Status  Partially Met      PT LONG TERM GOAL #2   Title  Patient to report reduction in frequency and intensity of low back and LE radicular pain by >/= 50%    Status  Partially Met   03/27/20  48% improvement in LBP since starting therapy.  85% improvement in LE radicular pain     PT LONG TERM GOAL #3   Title  Patient will demonstrate improved B LE strength to >/= 4/5 to 4+/5 for improved stability and ease of mobility    Status  Achieved      PT LONG TERM GOAL #4   Title  Patient will ambulate >/= 500 ft with RW or LRAD demonstrating normal gait pattern and gait speed >/= 2.62 ft/sec to increase safety with community ambulation    Status  Partially Met   03/27/20:  2.30f/sec with RW, 500 ft with RW however with L antalgic gait with RW and complaint of L 8/10 buttocks pain     PT LONG TERM GOAL #5   Title  Patient will improve Berg score to >/= 36/56 to improve safety stability with ADLs in standing and reduce risk for falls    Status  Partially Met   03/27/20:  30/56  Plan - 03/30/20 1044    Clinical Impression Statement  Gilmer arrived at PT appt today with c/o increased L buttock pain with decreased standing/weightbearing tolerance. Increased muscle tension/taut bands identified, primarily in piriformis, which were  addressed with manual STM/MFR followed by stretching and strengthening exercises to reinforce normalization of muscle tension. Decreased control with increased shakiness noted during strength exercises but patient denied increased pain noting only weakness. Pain improved by end of session but still limiting standing/walking tolerance, therefore session concluded with estim and moist heat to L glutes/piriformis.    Personal Factors and Comorbidities  Comorbidity 3+;Past/Current Experience;Behavior Pattern;Fitness;Time since onset of injury/illness/exacerbation;Transportation    Comorbidities  CVA x 2 with L hemiparesis and neurocognitive disorder, LBP with L LE radiculopathy, uncontrolled HTN, acute systolic heart failure d/t HTN, tachycardia, DM, R diabetic foot wound d/t ingrown toenail, AKI, ETOH & cocaine abuse, schizophrenia    Examination-Activity Limitations  Bathing;Bed Mobility;Bend;Caring for Others;Carry;Continence;Dressing;Hygiene/Grooming;Lift;Locomotion Level;Sit;Sleep;Squat;Stairs;Stand;Toileting;Transfers    Examination-Participation Restrictions  Church;Cleaning;Community Activity;Driving;Interpersonal Relationship;Laundry;Medication Management;Meal Prep;Personal Finances;Shop;Volunteer    Rehab Potential  Good    PT Frequency  2x / week    PT Duration  8 weeks    PT Treatment/Interventions  ADLs/Self Care Home Management;Cryotherapy;Electrical Stimulation;Moist Heat;Traction;Ultrasound;DME Instruction;Gait training;Stair training;Functional mobility training;Therapeutic activities;Therapeutic exercise;Balance training;Neuromuscular re-education;Patient/family education;Manual techniques;Passive range of motion;Dry needling;Energy conservation;Taping;Spinal Manipulations    PT Next Visit Plan  Lumbopelvic flexibility and strengthening; manual therapy and modalities PRN for pain    PT Home Exercise Plan  02/15/20 - pelvic tilt, SKTC &  KTOS piriformis stretches, LTR; 03/02/20 - seated HS  stretch, hooklying yellow TB alt clam, yellow TB brace marching; 03/27/20 - seated KTOS and figure-4 glute/piriformis stretches    Consulted and Agree with Plan of Care  Patient    Family Member Consulted  cousin       Patient will benefit from skilled therapeutic intervention in order to improve the following deficits and impairments:  Abnormal gait, Cardiopulmonary status limiting activity, Decreased activity tolerance, Decreased balance, Decreased cognition, Decreased coordination, Decreased endurance, Decreased knowledge of precautions, Decreased knowledge of use of DME, Decreased mobility, Decreased range of motion, Decreased safety awareness, Decreased skin integrity, Decreased strength, Difficulty walking, Increased muscle spasms, Impaired perceived functional ability, Impaired flexibility, Impaired sensation, Impaired tone, Improper body mechanics, Postural dysfunction, Pain  Visit Diagnosis: Chronic bilateral low back pain with left-sided sciatica  Hemiplegia and 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 Abnormal EKG 02/07/2020  . Left hip pain 02/07/2020  . AKI (acute kidney injury) ([place]) 02/07/2020  . Systolic heart failure secondary to hypertension ([place])   . Schizophrenia ([place])   . Acute systolic heart failure ([place]) 02/02/2018  . Chest pain 01/31/2018  . Hypokalemia   . Schizoaffective disorder, bipolar type ([place]) 11/22/2017  . Trauma   . Tachycardia 03/03/2017  . Alcohol abuse   . Cocaine abuse ([place])   . Hypertensive urgency   . Tachycardia   . Cocaine abuse with cocaine-induced mood disorder ([place]) 12/05/2016  . Cerebrovascular disease 09/02/2015  . 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 Malignant hypertension 08/30/2015  . Alcohol use  disorder, severe, dependence ([place]) 08/30/2015  . Alcohol withdrawal ([place]) 08/30/2015  . Alcohol abuse with alcohol-induced mood disorder ([place]) 08/30/2015  . Thrombocytopenia ([place]) 08/27/2015  . Tobacco use disorder 08/26/2015  . Hyperlipidemia 04/28/2015  .  Schizoaffective disorder, depressive type ([place])   . Gout 08/09/2013  . Diabetes mellitus ([place]) 07/09/2012    Percival Spanish, PT, MPT 03/30/2020, 1:40 PM  [facility] [address]  [place], [place], [address] Phone: [phone]   Fax:  [phone]  Name: Coe Angelos MRN: [id number] Date of Birth: 09-20-1961

## 2020-04-03 ENCOUNTER — Ambulatory Visit: Payer: Medicaid Other

## 2020-04-06 ENCOUNTER — Other Ambulatory Visit: Payer: Self-pay

## 2020-04-06 ENCOUNTER — Ambulatory Visit: Payer: Medicaid Other | Admitting: Physical Therapy

## 2020-04-06 ENCOUNTER — Encounter: Payer: Self-pay | Admitting: Physical Therapy

## 2020-04-06 DIAGNOSIS — Z9181 History of falling: Secondary | ICD-10-CM

## 2020-04-06 DIAGNOSIS — G8929 Other chronic pain: Secondary | ICD-10-CM

## 2020-04-06 DIAGNOSIS — R262 Difficulty in walking, not elsewhere classified: Secondary | ICD-10-CM

## 2020-04-06 DIAGNOSIS — M5442 Lumbago with sciatica, left side: Secondary | ICD-10-CM | POA: Diagnosis not present

## 2020-04-06 DIAGNOSIS — I69354 Hemiplegia and hemiparesis following cerebral infarction affecting left non-dominant side: Secondary | ICD-10-CM

## 2020-04-06 NOTE — Therapy (Signed)
[facility] [address]  [place], [place], [address] Phone: [phone]   Fax:  [phone]  Physical Therapy Treatment  Patient Details  Name: [name] MRN: [id number] Date of Birth: [birth year] Referring Provider (PT): Kerney Elbe, [place]   Encounter Date: 04/06/2020  PT End of Session - 04/06/20 1013    Visit Number  12    Number of Visits  16    Date for PT Re-Evaluation  04/11/20    Authorization Type  Medicaid    Authorization Time Period  03/02/20 - 04/11/20    Authorization - Visit Number  8    Authorization - Number of Visits  12    PT Start Time  1013    PT Stop Time  1117    PT Time Calculation (min)  64 min    Activity Tolerance  Patient tolerated treatment well   elevated BP   Behavior During Therapy  [facility] for tasks assessed/performed       Past Medical History:  Diagnosis Date  . Alcohol abuse   . Arthritis   . Crack cocaine use   . Depression   . Diabetes mellitus   . Drug abuse ([place])   . DTs (delirium tremens) ([place])    history of   . Gout   . Noncompliance with medication regimen   . Schizophrenia ([place])   . Stroke ([place])   . Systolic heart failure secondary to hypertension ([place])   . Uncontrolled hypertension     Past Surgical History:  Procedure Laterality Date  . LEFT HEART CATH AND CORONARY ANGIOGRAPHY N/A 02/03/2018   Procedure: LEFT HEART CATH AND CORONARY ANGIOGRAPHY;  Surgeon: Jettie Booze, MD;  Location: [facility];  Service: Cardiovascular;  Laterality: N/A;  . MANDIBLE RECONSTRUCTION    . TONSILLECTOMY      There were no vitals filed for this visit.  Subjective Assessment - 04/06/20 1020    Subjective  Pt stating "my L leg is killing me today" - "can barely walk on it".    Pertinent History  CVA x 2 with L hemiparesis and neurocognitive disorder, LBP with L LE radiculopathy, uncontrolled HTN, acute systolic heart failure d/t HTN, tachycardia,  DM, R diabetic foot wound d/t ingrown toenail, AKI, ETOH & cocaine abuse, schizophrenia    Patient Stated Goals  "100% positive"    Currently in Pain?  Yes    Pain Score  10-Worst pain ever   "11/10"   Pain Location  Leg    Pain Orientation  Left    Pain Descriptors / Indicators  Other (Comment)   "like you been running and your leg is giving out"   Pain Type  Chronic pain    Pain Radiating Towards  L buttock down back of leg to foot    Pain Frequency  Intermittent    Pain Relieving Factors  NuStep, manual massage                        [facility] Adult PT Treatment/Exercise - 04/06/20 1013      Lumbar Exercises: Stretches   Passive Hamstring Stretch  Left;30 seconds;2 reps    Passive Hamstring Stretch Limitations  seated hip hinge    Lower Trunk Rotation  10 seconds;5 reps    Piriformis Stretch  Left;30 seconds;2 reps    Piriformis Stretch Limitations  seated KTOS    Figure 4 Stretch  30 seconds;2 reps;Seated;With  overpressure    Figure 4 Stretch Limitations  seated hip hinge      Lumbar Exercises: Aerobic   Nustep  L4 x 6 min (UE/LE)    seat #11     Lumbar Exercises: Supine   Clam  10 reps;3 seconds    Clam Limitations  yellow TB alt hip ABD/ER - limited control    Bent Knee Raise  10 reps;3 seconds    Bent Knee Raise Limitations  yellow TB brace marching    Bridge  5 reps;3 seconds    Bridge Limitations  + yellow TB hip ABD isometric - limited lift      Modalities   Modalities  Electrical Stimulation;Moist Heat      Moist Heat Therapy   Number Minutes Moist Heat  15 Minutes    Moist Heat Location  Lumbar Spine;Hip   L low back/buttocks     Electrical Stimulation   Electrical Stimulation Location  L lumbar paraspinalss & glutes/piriformis    Electrical Stimulation Action  TENS    Electrical Stimulation Parameters  SD2, intensity to pt tol x 15'    Electrical Stimulation Goals  Pain;Tone      Manual Therapy   Manual Therapy  Other (comment)    Other  Manual Therapy  Provided instruction in sefl-STM using tennis ball under buttock in sitting for piriformis and standing against tennis ball on wall for glutes               PT Short Term Goals - 03/09/20 1232      PT SHORT TERM GOAL #1   Title  Complete balance and gait assessment once protective footwear available and establish LTGs as indicated    Status  Achieved   02/24/20     PT SHORT TERM GOAL #2   Title  Patient will be independent with initial HEP    Status  Achieved   03/02/20     PT SHORT TERM GOAL #3   Title  Patient will demonstrate safe sit to/from stand transfer technique with RW to reduce fall risk    Status  Achieved   03/02/20       PT Long Term Goals - 03/27/20 1028      PT LONG TERM GOAL #1   Title  Patient will be independent with ongoing/advanced HEP    Status  Partially Met      PT LONG TERM GOAL #2   Title  Patient to report reduction in frequency and intensity of low back and LE radicular pain by >/= 50%    Status  Partially Met   03/27/20  48% improvement in LBP since starting therapy.  85% improvement in LE radicular pain     PT LONG TERM GOAL #3   Title  Patient will demonstrate improved B LE strength to >/= 4/5 to 4+/5 for improved stability and ease of mobility    Status  Achieved      PT LONG TERM GOAL #4   Title  Patient will ambulate >/= 500 ft with RW or LRAD demonstrating normal gait pattern and gait speed >/= 2.62 ft/sec to increase safety with community ambulation    Status  Partially Met   03/27/20:  2.71f/sec with RW, 500 ft with RW however with L antalgic gait with RW and complaint of L 8/10 buttocks pain     PT LONG TERM GOAL #5   Title  Patient will improve Berg score to >/= 36/56 to improve safety stability with  ADLs in standing and reduce risk for falls    Status  Partially Met   03/27/20:  30/56           Plan - 04/06/20 1032    Clinical Impression Statement  [place] continues to report elevated L LBP and LE  pain limiting standing and walking tolerance with pain reported as "11/10" on arrival to PT. He states when pain is bad at home, he just goes and lays down in bed and will not try exercises or moving around. HEP reviewed with some pain relief from 11/10 to 8/10 just with performance of stretches, therefore reinforced need to remain active with HEP to help with pain control. Pt able to demonstrate seated stretches with minimal to no cueing from PT, however, he demonstrated very little familiarity with lumbopelvic strengthening exercise despite prior home instruction and recent performance during therapy sessions indicating poor likelihood of consistent home performance. Chronic LBP with L sciatica and limited follow-through with HEP limiting ability to progress therapeutic exercises and activities and address deficits related to balance and gait due to limited standing/walking tolerance. Pt continues to report pain relief with use of estim but states his daughter has not purchased the home TENS unit previously recommended, therefore provided info for home unit again.    Personal Factors and Comorbidities  Comorbidity 3+;Past/Current Experience;Behavior Pattern;Fitness;Time since onset of injury/illness/exacerbation;Transportation    Comorbidities  CVA x 2 with L hemiparesis and neurocognitive disorder, LBP with L LE radiculopathy, uncontrolled HTN, acute systolic heart failure d/t HTN, tachycardia, DM, R diabetic foot wound d/t ingrown toenail, AKI, ETOH & cocaine abuse, schizophrenia    Examination-Activity Limitations  Bathing;Bed Mobility;Bend;Caring for Others;Carry;Continence;Dressing;Hygiene/Grooming;Lift;Locomotion Level;Sit;Sleep;Squat;Stairs;Stand;Toileting;Transfers    Examination-Participation Restrictions  Church;Cleaning;Community Activity;Driving;Interpersonal Relationship;Laundry;Medication Management;Meal Prep;Personal Finances;Shop;Volunteer    Rehab Potential  Good    PT Frequency  2x / week     PT Duration  8 weeks    PT Treatment/Interventions  ADLs/Self Care Home Management;Cryotherapy;Electrical Stimulation;Moist Heat;Traction;Ultrasound;DME Instruction;Gait training;Stair training;Functional mobility training;Therapeutic activities;Therapeutic exercise;Balance training;Neuromuscular re-education;Patient/family education;Manual techniques;Passive range of motion;Dry needling;Energy conservation;Taping;Spinal Manipulations    PT Next Visit Plan  Recert vs discharge    PT Home Exercise Plan  02/15/20 - pelvic tilt, SKTC &  KTOS piriformis stretches, LTR; 03/02/20 - seated HS stretch, hooklying yellow TB alt clam, yellow TB brace marching; 03/27/20 - seated KTOS and figure-4 glute/piriformis stretches    Consulted and Agree with Plan of Care  Patient    Family Member Consulted  cousin       Patient will benefit from skilled therapeutic intervention in order to improve the following deficits and impairments:  Abnormal gait, Cardiopulmonary status limiting activity, Decreased activity tolerance, Decreased balance, Decreased cognition, Decreased coordination, Decreased endurance, Decreased knowledge of precautions, Decreased knowledge of use of DME, Decreased mobility, Decreased range of motion, Decreased safety awareness, Decreased skin integrity, Decreased strength, Difficulty walking, Increased muscle spasms, Impaired perceived functional ability, Impaired flexibility, Impaired sensation, Impaired tone, Improper body mechanics, Postural dysfunction, Pain  Visit Diagnosis: Chronic bilateral low back pain with left-sided sciatica  Hemiplegia and 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 Abnormal EKG 02/07/2020  . Left hip pain 02/07/2020  . AKI (acute kidney injury) ([place]) 02/07/2020  . Systolic heart  failure secondary to hypertension ([place])   . Schizophrenia ([place])   . Acute systolic heart  failure ([place]) 02/02/2018  . Chest pain 01/31/2018  . Hypokalemia   . Schizoaffective disorder, bipolar type ([place]) 11/22/2017  . Trauma   . Tachycardia 03/03/2017  . Alcohol abuse   . Cocaine abuse ([place])   . Hypertensive urgency   . Tachycardia   . Cocaine abuse with cocaine-induced mood disorder ([place]) 12/05/2016  . Cerebrovascular disease 09/02/2015  . 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 Malignant hypertension 08/30/2015  . Alcohol use disorder, severe, dependence ([place]) 08/30/2015  . Alcohol withdrawal ([place]) 08/30/2015  . Alcohol abuse with alcohol-induced mood disorder ([place]) 08/30/2015  . Thrombocytopenia ([place]) 08/27/2015  . Tobacco use disorder 08/26/2015  . Hyperlipidemia 04/28/2015  . Schizoaffective disorder, depressive type ([place])   . Gout 08/09/2013  . Diabetes mellitus ([place]) 07/09/2012    Percival Spanish, PT, MPT 04/06/2020, 6:50 PM  [facility] [address]  [place], [place], [address] Phone: [phone]   Fax:  [phone]  Name: Shahzaib Azevedo MRN: [id number] Date of Birth: 1961/01/21

## 2020-04-10 ENCOUNTER — Encounter: Payer: Self-pay | Admitting: Physical Therapy

## 2020-04-10 ENCOUNTER — Other Ambulatory Visit: Payer: Self-pay

## 2020-04-10 ENCOUNTER — Ambulatory Visit: Payer: Medicaid Other | Admitting: Physical Therapy

## 2020-04-10 DIAGNOSIS — I69354 Hemiplegia and hemiparesis following cerebral infarction affecting left non-dominant side: Secondary | ICD-10-CM

## 2020-04-10 DIAGNOSIS — R262 Difficulty in walking, not elsewhere classified: Secondary | ICD-10-CM

## 2020-04-10 DIAGNOSIS — G8929 Other chronic pain: Secondary | ICD-10-CM

## 2020-04-10 DIAGNOSIS — M5442 Lumbago with sciatica, left side: Secondary | ICD-10-CM | POA: Diagnosis not present

## 2020-04-10 DIAGNOSIS — Z9181 History of falling: Secondary | ICD-10-CM

## 2020-04-10 NOTE — Therapy (Signed)
[facility] [address]  [place], [place], [address] Phone: [phone]   Fax:  [phone]  Physical Therapy Treatment / Discharge Summary  Patient Details  Name: [name] MRN: [id number] Date of Birth: 10-Oct-1961 Referring Provider (PT): [place], [place] (Volney Presser, NP (new PCP))   Encounter Date: 04/10/2020  PT End of Session - 04/10/20 1025    Visit Number  13    Number of Visits  16    Date for PT Re-Evaluation  04/11/20    Authorization Type  Medicaid    Authorization Time Period  03/02/20 - 04/12/20    Authorization - Visit Number  9    Authorization - Number of Visits  12    PT Start Time  1025    PT Stop Time  1112    PT Time Calculation (min)  47 min    Activity Tolerance  Patient tolerated treatment well   elevated BP   Behavior During Therapy  WFL for tasks assessed/performed       Past Medical History:  Diagnosis Date  . Alcohol abuse   . Arthritis   . Crack cocaine use   . Depression   . Diabetes mellitus   . Drug abuse ([place])   . DTs (delirium tremens) ([place])    history of   . Gout   . Noncompliance with medication regimen   . Schizophrenia ([place])   . Stroke ([place])   . Systolic heart failure secondary to hypertension ([place])   . Uncontrolled hypertension     Past Surgical History:  Procedure Laterality Date  . LEFT HEART CATH AND CORONARY ANGIOGRAPHY N/A 02/03/2018   Procedure: LEFT HEART CATH AND CORONARY ANGIOGRAPHY;  Surgeon: Jettie Booze, MD;  Location: [facility];  Service: Cardiovascular;  Laterality: N/A;  . MANDIBLE RECONSTRUCTION    . TONSILLECTOMY      There were no vitals filed for this visit.  Subjective Assessment - 04/10/20 1029    Subjective  Pt reports he has been staying in bed more due to pain. Pt reports his dtr has ordered a TENS unit but has not received it yet.    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  Yes    Pain Score  10-Worst pain ever   11/10   Pain Location  Buttocks    Pain Orientation  Left    Pain Type  Chronic pain    Pain Radiating Towards  L buttock down back of leg to calf    Pain Frequency  Constant    Aggravating Factors   anytime I get up    Pain Relieving Factors  unknown    Effect of Pain on Daily Activities  pain causes him to stay in bed more         [facility] PT Assessment - 04/10/20 1025      Assessment   Medical Diagnosis  Acute systolic heart failure; CVA with L hemiparesis; LBP with L LE radiculopathy    Referring Provider (PT)  [place], NP (new PCP)   Onset Date/Surgical Date  02/10/20   hospital D/C   Hand  Dominance  Right      Strength   Right Hip Flexion  4+/5    Right Hip Extension  4+/5    Right Hip External Rotation   4+/5    Right Hip Internal Rotation  4+/5    Right Hip ABduction  4+/5    Right Hip ADduction  4+/5    Left Hip Flexion  4/5    Left Hip Extension  4/5    Left Hip External Rotation  4/5    Left Hip Internal Rotation  4/5    Left Hip ABduction  4/5    Left Hip ADduction  4/5    Right Knee Flexion  5/5    Right Knee Extension  5/5    Left Knee Flexion  4+/5    Left Knee Extension  5/5    Right Ankle Dorsiflexion  5/5    Left Ankle Dorsiflexion  4+/5      Ambulation/Gait   Assistive device  Rolling walker    Gait Pattern  Antalgic;Step-through pattern;Decreased weight shift to left;Decreased stance time - left;Lateral trunk lean to right;Wide base of support    Ambulation Surface  Level;Indoor    Gait velocity  1.76 ft/sec      Standardized Balance Assessment   10 Meter Walk  18.66 sec with RW      Berg Balance Test   Sit to Stand  Able  to stand without using hands and stabilize independently    Standing Unsupported  Able to stand 30 seconds unsupported   limited by pain in L leg   Sitting with Back Unsupported but Feet Supported on Floor or Stool  Able to sit safely and securely 2 minutes    Stand to Sit  Sits safely with minimal use of hands    Transfers  Able to transfer safely, definite need of hands    Standing Unsupported with Eyes Closed  Able to stand 3 seconds    Standing Unsupported with Feet Together  Able to place feet together independently but unable to hold for 30 seconds    From Standing, Reach Forward with Outstretched Arm  Loses balance while trying/requires external support   limited by pain in L leg   From Standing Position, Pick up Object from Floor  Able to pick up shoe, needs supervision    From Standing Position, Turn to Look Behind Over each Shoulder  Looks behind one side only/other side shows less weight shift    Turn 360 Degrees  Needs assistance while turning   unwilling to attempt due to L leg pain   Standing Unsupported, Alternately Place Feet on Step/Stool  Able to complete >2 steps/needs minimal assist    Standing Unsupported, One Foot in [employer] balance while stepping or standing    Standing on One Leg  Tries to lift leg/unable to hold 3 seconds but remains standing independently    Total Score  29    Berg comment:  < 36 high risk for falls (close to 100%      Timed Up and Go Test   Normal TUG (seconds)  21.53   with RW                           PT Education - 04/10/20 1418    Education Details  Need for f/u with PCP and/or neurosurgery/orthopedic specialists regarding ongoing pain and poor PT activity/exercise tolerance before proceeding with PT as Medicaid limits annual  treatment visits to 27 (12 used) as we do not want to exhaust visits if he is not benefitting from skilled PT due to pain interference, plus Medicaid unlikely to authorize additonal visits w/o  progress identified or good potential for further improvement    Person(s) Educated  Patient;Caregiver(s)    Methods  Explanation    Comprehension  Verbalized understanding       PT Short Term Goals - 03/09/20 1232      PT SHORT TERM GOAL #1   Title  Complete balance and gait assessment once protective footwear available and establish LTGs as indicated    Status  Achieved   02/24/20     PT SHORT TERM GOAL #2   Title  Patient will be independent with initial HEP    Status  Achieved   03/02/20     PT SHORT TERM GOAL #3   Title  Patient will demonstrate safe sit to/from stand transfer technique with RW to reduce fall risk    Status  Achieved   03/02/20       PT Long Term Goals - 04/10/20 1038      PT LONG TERM GOAL #1   Title  Patient will be independent with ongoing/advanced HEP    Status  Not Met   Pt states he can't do his HEP due to pain     PT LONG TERM GOAL #2   Title  Patient to report reduction in frequency and intensity of low back and LE radicular pain by >/= 50%    Status  Not Met      PT LONG TERM GOAL #3   Title  Patient will demonstrate improved B LE strength to >/= 4/5 to 4+/5 for improved stability and ease of mobility    Status  Achieved      PT LONG TERM GOAL #4   Title  Patient will ambulate >/= 500 ft with RW or LRAD demonstrating normal gait pattern and gait speed >/= 2.62 ft/sec to increase safety with community ambulation    Status  Not Met      PT LONG TERM GOAL #5   Title  Patient will improve Berg score to >/= 36/56 to improve safety stability with ADLs in standing and reduce risk for falls    Status  Not Met            Plan - 04/10/20 1112    Clinical Impression Statement  Deondrick continues to report "off the scale" pain ratings in L buttocks and LE, currently 11/10 but up to 15/10 in recent visits and only down to 5/10 at best on 1 visit during POC. Pain limits participation in therapeutic exercises and activities during therapy  sessions and causes to remain in bed most days and prevents him from completing his HEP. Manual therapy and modalities have provided some very temporary relief during therapy sessions, however pt reports pain returns "just as bad or worse" following therapy sessions. A home TENS unit was recommended, however pt reports he has not yet obtained one (one may be on order). Harman has demonstrated improvement in overall B LE strength with LTG #3 met, however gait and balance along with safety with RW remains impaired due to inability to shift weight to L LE due to pain, with recent decline noted with standardized testing for gait speed and Berg - remaining LTGs not met. Given poor tolerance for PT and HEP due to pain along with recent decline in standardized testing, will recommend  discharge from PT until he is able to be seen by neurosurgery and orthopedics to address his ongoing pain. Once pain better managed, [place] may benefit from further PT for continued strengthening, gait training and balance to increase safety and decrease fall risk.    Personal Factors and Comorbidities  Comorbidity 3+;Past/Current Experience;Behavior Pattern;Fitness;Time since onset of injury/illness/exacerbation;Transportation    Comorbidities  CVA x 2 with L hemiparesis and neurocognitive disorder, LBP with L LE radiculopathy, uncontrolled HTN, acute systolic heart failure d/t HTN, tachycardia, DM, R diabetic foot wound d/t ingrown toenail, AKI, ETOH & cocaine abuse, schizophrenia    Examination-Activity Limitations  Bathing;Bed Mobility;Bend;Caring for Others;Carry;Continence;Dressing;Hygiene/Grooming;Lift;Locomotion Level;Sit;Sleep;Squat;Stairs;Stand;Toileting;Transfers    Examination-Participation Restrictions  Church;Cleaning;Community Activity;Driving;Interpersonal Relationship;Laundry;Medication Management;Meal Prep;Personal Finances;Shop;Volunteer    Rehab Potential  Poor    PT Treatment/Interventions  ADLs/Self Care  Home Management;Cryotherapy;Electrical Stimulation;Moist Heat;Traction;Ultrasound;DME Instruction;Gait training;Stair training;Functional mobility training;Therapeutic activities;Therapeutic exercise;Balance training;Neuromuscular re-education;Patient/family education;Manual techniques;Passive range of motion;Dry needling;Energy conservation;Taping;Spinal Manipulations    PT Next Visit Plan  Discharge    PT Home Exercise Plan  02/15/20 - pelvic tilt, SKTC &  KTOS piriformis stretches, LTR; 03/02/20 - seated HS stretch, hooklying yellow TB alt clam, yellow TB brace marching; 03/27/20 - seated KTOS and figure-4 glute/piriformis stretches    Consulted and Agree with Plan of Care  Patient    Family Member Consulted  cousin       Patient will benefit from skilled therapeutic intervention in order to improve the following deficits and impairments:  Abnormal gait, Cardiopulmonary status limiting activity, Decreased activity tolerance, Decreased balance, Decreased cognition, Decreased coordination, Decreased endurance, Decreased knowledge of precautions, Decreased knowledge of use of DME, Decreased mobility, Decreased range of motion, Decreased safety awareness, Decreased skin integrity, Decreased strength, Difficulty walking, Increased muscle spasms, Impaired perceived functional ability, Impaired flexibility, Impaired sensation, Impaired tone, Improper body mechanics, Postural dysfunction, Pain  Visit Diagnosis: Chronic bilateral low back pain with left-sided sciatica  Hemiplegia and 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 Abnormal EKG 02/07/2020  . Left hip pain 02/07/2020  . AKI (acute kidney injury) ([place]) 02/07/2020  . Systolic heart failure secondary to hypertension ([place])   . Schizophrenia ([place])   . Acute systolic  heart failure ([place]) 02/02/2018  . Chest pain 01/31/2018  . Hypokalemia   . Schizoaffective disorder, bipolar type ([place]) 11/22/2017  . Trauma   . Tachycardia 03/03/2017  . Alcohol abuse   . Cocaine abuse ([place])   . Hypertensive urgency   . Tachycardia   . Cocaine abuse with cocaine-induced mood disorder ([place]) 12/05/2016  . Cerebrovascular disease 09/02/2015  . 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 Malignant hypertension 08/30/2015  . Alcohol use disorder, severe, dependence ([place]) 08/30/2015  . Alcohol withdrawal ([place]) 08/30/2015  . Alcohol abuse with alcohol-induced mood disorder ([place]) 08/30/2015  . Thrombocytopenia ([place]) 08/27/2015  . Tobacco use disorder 08/26/2015  . Hyperlipidemia 04/28/2015  . Schizoaffective disorder, depressive type ([place])   . Gout 08/09/2013  . Diabetes mellitus ([place]) 07/09/2012   PHYSICAL THERAPY DISCHARGE SUMMARY  Visits from Start of Care: 13  Current functional level related to goals / functional outcomes:   Refer to above clinical impression.   Remaining deficits:   As above. Ongoing "off the scale" pain in L buttock and LE limiting therapy participating and preventing progression of activities/exercises.   Education /  Equipment:   HEP, posture and body mechanics education  Plan: Patient agrees to discharge.  Patient goals were partially met. Patient is being discharged due to lack of progress.  ?????     Percival Spanish, PT, MPT 04/10/2020, 2:27 PM  [facility] [address]  [place], [place], [address] Phone: [phone]   Fax:  [phone]  Name: Madex Seals MRN: [id number] Date of Birth: [birth year]

## 2020-04-17 ENCOUNTER — Other Ambulatory Visit: Payer: Self-pay

## 2020-04-17 ENCOUNTER — Encounter (HOSPITAL_COMMUNITY): Payer: Self-pay | Admitting: *Deleted

## 2020-04-17 ENCOUNTER — Emergency Department (HOSPITAL_COMMUNITY): Payer: Medicaid Other

## 2020-04-17 ENCOUNTER — Inpatient Hospital Stay (HOSPITAL_COMMUNITY)
Admission: EM | Admit: 2020-04-17 | Discharge: 2020-04-20 | DRG: 065 | Disposition: A | Discharge status: Rehabilitation Facility | Payer: Medicaid Other | Attending: Internal Medicine | Admitting: Internal Medicine

## 2020-04-17 DIAGNOSIS — F1010 Alcohol abuse, uncomplicated: Secondary | ICD-10-CM | POA: Diagnosis present

## 2020-04-17 DIAGNOSIS — Z7984 Long term (current) use of oral hypoglycemic drugs: Secondary | ICD-10-CM

## 2020-04-17 DIAGNOSIS — M109 Gout, unspecified: Secondary | ICD-10-CM | POA: Diagnosis present

## 2020-04-17 DIAGNOSIS — E876 Hypokalemia: Secondary | ICD-10-CM | POA: Diagnosis present

## 2020-04-17 DIAGNOSIS — Z79899 Other long term (current) drug therapy: Secondary | ICD-10-CM

## 2020-04-17 DIAGNOSIS — N179 Acute kidney failure, unspecified: Secondary | ICD-10-CM | POA: Diagnosis present

## 2020-04-17 DIAGNOSIS — I6930 Unspecified sequelae of cerebral infarction: Secondary | ICD-10-CM

## 2020-04-17 DIAGNOSIS — I639 Cerebral infarction, unspecified: Principal | ICD-10-CM | POA: Diagnosis present

## 2020-04-17 DIAGNOSIS — Z8249 Family history of ischemic heart disease and other diseases of the circulatory system: Secondary | ICD-10-CM

## 2020-04-17 DIAGNOSIS — F250 Schizoaffective disorder, bipolar type: Secondary | ICD-10-CM | POA: Diagnosis present

## 2020-04-17 DIAGNOSIS — M25552 Pain in left hip: Secondary | ICD-10-CM | POA: Diagnosis present

## 2020-04-17 DIAGNOSIS — F17210 Nicotine dependence, cigarettes, uncomplicated: Secondary | ICD-10-CM | POA: Diagnosis present

## 2020-04-17 DIAGNOSIS — F1990 Other psychoactive substance use, unspecified, uncomplicated: Secondary | ICD-10-CM

## 2020-04-17 DIAGNOSIS — E1159 Type 2 diabetes mellitus with other circulatory complications: Secondary | ICD-10-CM

## 2020-04-17 DIAGNOSIS — E119 Type 2 diabetes mellitus without complications: Secondary | ICD-10-CM

## 2020-04-17 DIAGNOSIS — R059 Cough, unspecified: Secondary | ICD-10-CM

## 2020-04-17 DIAGNOSIS — Z20822 Contact with and (suspected) exposure to covid-19: Secondary | ICD-10-CM | POA: Diagnosis present

## 2020-04-17 DIAGNOSIS — R471 Dysarthria and anarthria: Secondary | ICD-10-CM | POA: Diagnosis present

## 2020-04-17 DIAGNOSIS — E669 Obesity, unspecified: Secondary | ICD-10-CM | POA: Diagnosis present

## 2020-04-17 DIAGNOSIS — Z7982 Long term (current) use of aspirin: Secondary | ICD-10-CM

## 2020-04-17 DIAGNOSIS — M1990 Unspecified osteoarthritis, unspecified site: Secondary | ICD-10-CM | POA: Diagnosis present

## 2020-04-17 DIAGNOSIS — I6302 Cerebral infarction due to thrombosis of basilar artery: Secondary | ICD-10-CM

## 2020-04-17 DIAGNOSIS — R29703 NIHSS score 3: Secondary | ICD-10-CM | POA: Diagnosis present

## 2020-04-17 DIAGNOSIS — I110 Hypertensive heart disease with heart failure: Secondary | ICD-10-CM | POA: Diagnosis present

## 2020-04-17 DIAGNOSIS — I69318 Other symptoms and signs involving cognitive functions following cerebral infarction: Secondary | ICD-10-CM

## 2020-04-17 DIAGNOSIS — I152 Hypertension secondary to endocrine disorders: Secondary | ICD-10-CM

## 2020-04-17 DIAGNOSIS — E860 Dehydration: Secondary | ICD-10-CM | POA: Diagnosis present

## 2020-04-17 DIAGNOSIS — R2990 Unspecified symptoms and signs involving the nervous system: Secondary | ICD-10-CM | POA: Diagnosis not present

## 2020-04-17 DIAGNOSIS — Z6831 Body mass index (BMI) 31.0-31.9, adult: Secondary | ICD-10-CM

## 2020-04-17 DIAGNOSIS — E1169 Type 2 diabetes mellitus with other specified complication: Secondary | ICD-10-CM | POA: Diagnosis present

## 2020-04-17 DIAGNOSIS — R531 Weakness: Secondary | ICD-10-CM | POA: Diagnosis not present

## 2020-04-17 DIAGNOSIS — Z7902 Long term (current) use of antithrombotics/antiplatelets: Secondary | ICD-10-CM

## 2020-04-17 DIAGNOSIS — E785 Hyperlipidemia, unspecified: Secondary | ICD-10-CM | POA: Diagnosis present

## 2020-04-17 DIAGNOSIS — E1165 Type 2 diabetes mellitus with hyperglycemia: Secondary | ICD-10-CM | POA: Diagnosis present

## 2020-04-17 DIAGNOSIS — F251 Schizoaffective disorder, depressive type: Secondary | ICD-10-CM | POA: Diagnosis present

## 2020-04-17 DIAGNOSIS — G8191 Hemiplegia, unspecified affecting right dominant side: Secondary | ICD-10-CM | POA: Diagnosis present

## 2020-04-17 DIAGNOSIS — I5042 Chronic combined systolic (congestive) and diastolic (congestive) heart failure: Secondary | ICD-10-CM

## 2020-04-17 DIAGNOSIS — G8929 Other chronic pain: Secondary | ICD-10-CM | POA: Diagnosis present

## 2020-04-17 DIAGNOSIS — I69351 Hemiplegia and hemiparesis following cerebral infarction affecting right dominant side: Secondary | ICD-10-CM

## 2020-04-17 LAB — URINALYSIS, ROUTINE W REFLEX MICROSCOPIC
Bacteria, UA: NONE SEEN
Bilirubin Urine: NEGATIVE
Glucose, UA: NEGATIVE mg/dL
Hgb urine dipstick: NEGATIVE
Ketones, ur: NEGATIVE mg/dL
Leukocytes,Ua: NEGATIVE
Nitrite: NEGATIVE
Protein, ur: 100 mg/dL — AB
Specific Gravity, Urine: 1.02 (ref 1.005–1.030)
pH: 6 (ref 5.0–8.0)

## 2020-04-17 LAB — I-STAT CHEM 8, ED
BUN: 23 mg/dL — ABNORMAL HIGH (ref 6–20)
Calcium, Ion: 1.21 mmol/L (ref 1.15–1.40)
Chloride: 106 mmol/L (ref 98–111)
Creatinine, Ser: 1.4 mg/dL — ABNORMAL HIGH (ref 0.61–1.24)
Glucose, Bld: 141 mg/dL — ABNORMAL HIGH (ref 70–99)
HCT: 38 % — ABNORMAL LOW (ref 39.0–52.0)
Hemoglobin: 12.9 g/dL — ABNORMAL LOW (ref 13.0–17.0)
Potassium: 3.7 mmol/L (ref 3.5–5.1)
Sodium: 142 mmol/L (ref 135–145)
TCO2: 27 mmol/L (ref 22–32)

## 2020-04-17 LAB — COMPREHENSIVE METABOLIC PANEL
ALT: 11 U/L (ref 0–44)
AST: 21 U/L (ref 15–41)
Albumin: 4.1 g/dL (ref 3.5–5.0)
Alkaline Phosphatase: 75 U/L (ref 38–126)
Anion gap: 8 (ref 5–15)
BUN: 23 mg/dL — ABNORMAL HIGH (ref 6–20)
CO2: 25 mmol/L (ref 22–32)
Calcium: 9.3 mg/dL (ref 8.9–10.3)
Chloride: 106 mmol/L (ref 98–111)
Creatinine, Ser: 1.4 mg/dL — ABNORMAL HIGH (ref 0.61–1.24)
GFR calc Af Amer: >60 mL/min (ref >60)
GFR calc non Af Amer: 55 mL/min — ABNORMAL LOW (ref >60)
Glucose, Bld: 143 mg/dL — ABNORMAL HIGH (ref 70–99)
Potassium: 3.7 mmol/L (ref 3.5–5.1)
Sodium: 139 mmol/L (ref 135–145)
Total Bilirubin: 0.8 mg/dL (ref 0.3–1.2)
Total Protein: 7.5 g/dL (ref 6.5–8.1)

## 2020-04-17 LAB — DIFFERENTIAL
Abs Immature Granulocytes: 0.02 10*3/uL (ref 0.00–0.07)
Basophils Absolute: 0.1 10*3/uL (ref 0.0–0.1)
Basophils Relative: 1 %
Eosinophils Absolute: 0.4 10*3/uL (ref 0.0–0.5)
Eosinophils Relative: 6 %
Immature Granulocytes: 0 %
Lymphocytes Relative: 33 %
Lymphs Abs: 2.6 10*3/uL (ref 0.7–4.0)
Monocytes Absolute: 0.7 10*3/uL (ref 0.1–1.0)
Monocytes Relative: 10 %
Neutro Abs: 3.9 10*3/uL (ref 1.7–7.7)
Neutrophils Relative %: 50 %

## 2020-04-17 LAB — PROTIME-INR
INR: 0.9 (ref 0.8–1.2)
Prothrombin Time: 12.2 seconds (ref 11.4–15.2)

## 2020-04-17 LAB — RAPID URINE DRUG SCREEN, HOSP PERFORMED
Amphetamines: NOT DETECTED
Barbiturates: NOT DETECTED
Benzodiazepines: NOT DETECTED
Cocaine: NOT DETECTED
Opiates: NOT DETECTED
Tetrahydrocannabinol: NOT DETECTED

## 2020-04-17 LAB — CBC
HCT: 39.1 % (ref 39.0–52.0)
Hemoglobin: 12.5 g/dL — ABNORMAL LOW (ref 13.0–17.0)
MCH: 27.2 pg (ref 26.0–34.0)
MCHC: 32 g/dL (ref 30.0–36.0)
MCV: 85.2 fL (ref 80.0–100.0)
Platelets: 210 10*3/uL (ref 150–400)
RBC: 4.59 MIL/uL (ref 4.22–5.81)
RDW: 12.3 % (ref 11.5–15.5)
WBC: 7.7 10*3/uL (ref 4.0–10.5)
nRBC: 0 % (ref 0.0–0.2)

## 2020-04-17 LAB — ETHANOL: Alcohol, Ethyl (B): <10 mg/dL (ref <10)

## 2020-04-17 LAB — APTT: aPTT: 34 seconds (ref 24–36)

## 2020-04-17 LAB — CBG MONITORING, ED: Glucose-Capillary: 141 mg/dL — ABNORMAL HIGH (ref 70–99)

## 2020-04-17 MED ORDER — SODIUM CHLORIDE 0.9% FLUSH
3.0000 mL | Freq: Once | INTRAVENOUS | Status: AC
Start: 2020-04-17 — End: 2020-04-18
  Administered 2020-04-18: 3 mL via INTRAVENOUS

## 2020-04-17 NOTE — ED Triage Notes (Signed)
Pt arrived to triage by his brother and a cousin for stroke like symptoms. Triage RN contacted brother who reported he last saw pt at baseline at 1600. They went to [employer] where pt choked briefly on his food. Pt was able to walk to the car; after arriving home, brother noticed pt was unable to ambulate and had a R sided facial droop. Pt has hx of CVA and R sided deficits.

## 2020-04-17 NOTE — Code Documentation (Signed)
Responded to Code Stroke called at 2147 on pt in triage. Pt came to hospital d/t inability to ambulate. In triage, RN notified facial droop and activated a Code Stroke. NIH-3 for facial droop, R leg drift, and dysarthria, CBG-141. CT head-negative for acute changes. TPA not given d/t out of window. Plan to admit for stroke workup.

## 2020-04-17 NOTE — ED Notes (Signed)
Pt aware of urine sample need for UA, urinal at bedside, call button within reach. Pt is resting, calm and cooperative

## 2020-04-17 NOTE — Consult Note (Signed)
Neurology Consultation  Reason for Consult: Code stroke-right-sided weakness, dysphagia Referring Physician: Dr. Kathrynn Humble, ED provider  CC: Right-sided weakness, dysphagia  History is obtained from: Patient's chart, daughter who is also the [place] the phone  HPI: [name] is a 59 y.o. male past medical history of cocaine abuse, multiple strokes with residual right-sided weakness using walker to walk, depression, diabetes, alcohol abuse, schizophrenia, systolic heart failure, uncontrolled hypertension, presented to the emergency room for worsening of right-sided weakness and dysphagia. Last known normal was sometime around noon when the family started noticing that he was having some difficulty keeping his food in his mouth and it was drooling more from the right side-which has been weaker than the left since his prior strokes. As the day went along, they noticed that his speech is more dysarthric and he is having more trouble walking-already uses a walker at baseline but was not able to support his weight even on the walker. They brought him to the emergency room for further evaluation as the symptoms were not improving.  He was brought in by a private vehicle, seen at triage and a code stroke was activated because they reported a last known well of a few hours prior but later on on history taking it was deemed that last known normal was sometime around noon today. Patient is unable to provide any reliable history. History was obtained over the phone with daughter [name], who is his caretaker and healthcare power of attorney for the patient. Has a history of crack cocaine use-has been clean for 3 years according to the daughter.  LKW: 12 PM on 02/16/2020 tpa given?: no, outside the window Premorbid modified Rankin scale (mRS): 3 at best  ROS: Performed and negative except noted in HPI.  Past Medical History:  Diagnosis Date  . Alcohol abuse   .  Arthritis   . Crack cocaine use   . Depression   . Diabetes mellitus   . Drug abuse ([place])   . DTs (delirium tremens) ([place])    history of   . Gout   . Noncompliance with medication regimen   . Schizophrenia ([place])   . Stroke ([place])   . Systolic heart failure secondary to hypertension ([place])   . Uncontrolled hypertension      Family History  Problem Relation Age of Onset  . Hypertension Other     Social History:   reports that he has been smoking cigarettes. He has been smoking about 0.25 packs per day. He has never used smokeless tobacco. He reports current alcohol use of about 1.0 - 2.0 standard drinks of alcohol per week. He reports current drug use. Drugs: "Crack" cocaine and Cocaine.  According to daughter, has not used crack cocaine in 3 years. Medications  Current Facility-Administered Medications:  .  sodium chloride flush (NS) 0.9 % injection 3 mL, 3 mL, Intravenous, Once, Nanavati, Ankit, MD  Current Outpatient Medications:  .  acetaminophen (TYLENOL) 325 MG tablet, Take 2 tablets (650 mg total) by mouth every 6 (six) hours as needed for mild pain (or Fever >/= 101)., Disp: 30 tablet, Rfl: 0 .  amLODipine (NORVASC) 10 MG tablet, Take 1 tablet (10 mg total) by mouth daily., Disp: 30 tablet, Rfl: 0 .  carvedilol (COREG) 25 MG tablet, Take 1 tablet (25 mg total) by mouth 2 (two) times daily with a meal., Disp: 60 tablet, Rfl: 0 .  clopidogrel (PLAVIX) 75 MG tablet, Take 1 tablet (75 mg total) by mouth daily.,  Disp: 30 tablet, Rfl: 0 .  diclofenac Sodium (VOLTAREN) 1 % GEL, Apply 2 g topically 4 (four) times daily., Disp: 50 g, Rfl: 0 .  DULoxetine (CYMBALTA) 30 MG capsule, Take 1 capsule (30 mg total) by mouth 2 (two) times daily., Disp: 60 capsule, Rfl: 0 .  gabapentin (NEURONTIN) 300 MG capsule, Take 1 capsule (300 mg total) by mouth 3 (three) times daily., Disp: 90 capsule, Rfl: 0 .  hydrALAZINE (APRESOLINE) 25 MG tablet, Take 1 tablet (25 mg total) by mouth 3 (three) times  daily., Disp: 90 tablet, Rfl: 0 .  Lido-Capsaicin-Men-Methyl Sal (MEDI-PATCH-LIDOCAINE EX), Apply topically., Disp: , Rfl:  .  losartan (COZAAR) 50 MG tablet, Take 1 tablet (50 mg total) by mouth daily., Disp: 30 tablet, Rfl: 0 .  metFORMIN (GLUCOPHAGE) 500 MG tablet, Take 1 tablet (500 mg total) by mouth 2 (two) times daily with a meal., Disp: 60 tablet, Rfl: 0 .  ondansetron (ZOFRAN) 4 MG tablet, Take 1 tablet (4 mg total) by mouth every 6 (six) hours as needed for nausea., Disp: 20 tablet, Rfl: 0 .  paliperidone (INVEGA) 3 MG 24 hr tablet, Take 1 tablet (3 mg total) by mouth daily., Disp: 30 tablet, Rfl: 0 .  traMADol (ULTRAM) 50 MG tablet, Take 50 mg by mouth every 6 (six) hours as needed., Disp: , Rfl:    Exam: Current vital signs: Wt 101.1 kg   BMI 31.09 kg/m  Vital signs in last 24 hours: Weight:  [101.1 kg] 101.1 kg (05/31 2100) General: Awake alert in no distress HEENT normocephalic atraumatic Lungs: Clear Cardiovascular: Regular rate rhythm Extremities warm well perfused Abdomen nondistended nontender Neurological exam Awake alert oriented x3 Speech is moderately dysarthric No evidence of aphasia Cranial: Pupils equal round react light, extraocular movements intact, visual fields full, face is asymmetric-right face is lower facial weakness, tongue and palate midline. Motor exam: Mild weakness on the right side without vertical drift in the upper extremity.  Positive for right lower extremity drift.  Left upper and lower extremity without drift. Sensory exam: Intact Coordination: No obvious dysmetria NIH stroke scale-3.  Labs I have reviewed labs in epic and the results pertinent to this consultation are:  CBC    Component Value Date/Time   WBC 8.4 02/09/2020 1014   RBC 4.53 02/09/2020 1014   HGB 12.9 (L) 04/17/2020 2146   HCT 38.0 (L) 04/17/2020 2146   PLT 240 02/09/2020 1014   MCV 85.2 02/09/2020 1014   MCH 26.9 02/09/2020 1014   MCHC 31.6 02/09/2020 1014    RDW 14.5 02/09/2020 1014   LYMPHSABS 1.8 02/09/2020 1014   MONOABS 0.9 02/09/2020 1014   EOSABS 0.2 02/09/2020 1014   BASOSABS 0.0 02/09/2020 1014    CMP     Component Value Date/Time   NA 142 04/17/2020 2146   K 3.7 04/17/2020 2146   CL 106 04/17/2020 2146   CO2 25 02/10/2020 0548   GLUCOSE 141 (H) 04/17/2020 2146   BUN 23 (H) 04/17/2020 2146   CREATININE 1.40 (H) 04/17/2020 2146   CALCIUM 9.0 02/10/2020 0548   PROT 7.0 02/10/2020 0548   ALBUMIN 3.3 (L) 02/10/2020 0548   AST 14 (L) 02/10/2020 0548   ALT 7 02/10/2020 0548   ALKPHOS 73 02/10/2020 0548   BILITOT 0.4 02/10/2020 0548   GFRNONAA >60 02/10/2020 0548   GFRAA >60 02/10/2020 0548    Imaging I have reviewed the images obtained:  CT-scan of the brain-old left basal ganglia infarct.  Chronic ischemic  microangiopathy.  No bleed.  Aspects 10.  Assessment:  58 year old with above past medical history-multiple cerebrovascular risk factors-prior stroke with residual right hemiparesis, presented with worsening right-sided weakness and right facial droop. Last known normal outside the window for IV TPA. Exam not consistent with a large vessel occlusion, hence emergent vascular imaging not performed. Likely recrudescence of old stroke symptoms versus acute on chronic stroke in the left basal ganglia versus brainstem infarct given history of risk factors. Cannot rule out an embolic source given risk factors as well. We will need admission for further work-up.  Impression: Evaluate for acute ischemic stroke versus recrudescence of old stroke symptoms  Recommendations: MRI brain without contrast stat MRA head and neck w/o (deranged renal function) Admit to hospitalist Frequent neurochecks Telemetry Continue Plavix for now High-dose statin A1c Lipid panel 2D echo PT OT  speech therapy Check UA, chest x-ray. Also check drug screen and EtOH levels D/W Dr Kathrynn Humble in the ER.  Stroke team will follow with  you -- Amie Portland, MD [facility] Pager: [phone] If 7pm to 7am, please call on call as listed on AMION.   CRITICAL CARE ATTESTATION Performed by: Amie Portland, MD Total critical care time: 45 minutes Critical care time was exclusive of separately billable procedures and treating other patients and/or supervising APPs/Residents/Students Critical care was necessary to treat or prevent imminent or life-threatening deterioration due to evaluation of possible acute ischemic stroke versus stroke recrudescence, decision on thrombolysis. This patient is critically ill and at significant risk for neurological worsening and/or death and care requires constant monitoring. Critical care was time spent personally by me on the following activities: development of treatment plan with patient and/or surrogate as well as nursing, discussions with consultants, evaluation of patient's response to treatment, examination of patient, obtaining history from patient or surrogate, ordering and performing treatments and interventions, ordering and review of laboratory studies, ordering and review of radiographic studies, pulse oximetry, re-evaluation of patient's condition, participation in multidisciplinary rounds and medical decision making of high complexity in the care of this patient.

## 2020-04-17 NOTE — H&P (Signed)
History and Physical    [name] ION:[id number] DOB: May 07, 1961 DOA: 04/17/2020  PCP: Boyce Medici, FNP  Patient coming from: Home  I have personally briefly reviewed patient's old medical records in [place]  Chief Complaint: Worsening right-sided weakness, dysarthria  HPI:  [name] is a 59 y.o. male with medical history significant for history of stroke with residual right-sided weakness and neurocognitive impairment, chronic combined systolic and diastolic CHF, type 2 diabetes, hypertension, hyperlipidemia, schizoaffective disorder, chronic left hip pain, and history of polysubstance use disorder who presents to the ED for evaluation of worsening right-sided weakness and dysarthria from his baseline.  Patient states he has chronic right-sided weakness at baseline secondary to his previous stroke.  He normally ambulates with the use of a walker and has been working with PT for chronic left hip pain.  Last known normal was sometime around noon 04/17/2020 when family noticed that patient was having difficulty keeping food in his mouth and was drooling more from the right side.  During the day his speech became more dysarthric and he was having more trouble walking even with his walker.  He says it got bad enough that he was not able to stand or support his weight even with the walker.  He was brought to the ED for further evaluation.  He denies any associated chest pain, palpitations, dyspnea, cough, nausea, vomiting, abdominal pain, or dysuria.  He denies any change in sensation/numbness or tingling.  He reports a former history of crack cocaine use but denies any recent illicit drug, alcohol, or tobacco use.  Speech remains dysarthric but he has purposeful speech and does understand when spoken to.  ED Course:  Initial vitals showed BP 173/1 5, pulse 79, RR 18, temp 98.3 Fahrenheit, SPO2 99% on room air.  Labs are notable for WBC 7.7, hemoglobin 12.5, platelets  210,000, sodium 139, potassium 3.7, bicarb 25, BUN 23, creatinine 1.4, LFTs within normal limits, serum glucose 143.  Urinalysis is negative for UTI.  UDS is negative.  Serum ethanol level is undetectable.  SARS-CoV-2 PCR is ordered and pending.  CT head without contrast was negative for acute intracranial abnormality.  Chronic ischemic microangiopathy and small vessel infarcts of the left basal ganglia are seen.  Portable chest x-ray is negative for focal consolidation, edema, or effusion.  Patient was seen by neurology as a code stroke.  tPA was not given as he was outside of the window.  Neurology recommended admission for further work-up of acute stroke versus recrudescence of old stroke symptoms.  MRI brain without contrast, MRI head and neck without contrast were ordered and pending.  The hospitalist service was consulted to admit for further evaluation management.  Review of Systems: All systems reviewed and are negative except as documented in history of present illness above.   Past Medical History:  Diagnosis Date  . Alcohol abuse   . Arthritis   . Crack cocaine use   . Depression   . Diabetes mellitus   . Drug abuse ([place])   . DTs (delirium tremens) ([place])    history of   . Gout   . Noncompliance with medication regimen   . Schizophrenia ([place])   . Stroke ([place])   . Systolic heart failure secondary to hypertension ([place])   . Uncontrolled hypertension     Past Surgical History:  Procedure Laterality Date  . LEFT HEART CATH AND CORONARY ANGIOGRAPHY N/A 02/03/2018   Procedure: LEFT HEART CATH AND CORONARY ANGIOGRAPHY;  Surgeon: Irish Lack,  Charlann Lange, MD;  Location: [facility];  Service: Cardiovascular;  Laterality: N/A;  . MANDIBLE RECONSTRUCTION    . TONSILLECTOMY      Social History:  reports that he has been smoking cigarettes. He has been smoking about 0.25 packs per day. He has never used smokeless tobacco. He reports current alcohol use of about 1.0 - 2.0 standard  drinks of alcohol per week. He reports current drug use. Drugs: "Crack" cocaine and Cocaine.  No Known Allergies  Family History  Problem Relation Age of Onset  . Hypertension Other      Prior to Admission medications   Medication Sig Start Date End Date Taking? Authorizing Provider  acetaminophen (TYLENOL) 325 MG tablet Take 2 tablets (650 mg total) by mouth every 6 (six) hours as needed for mild pain (or Fever >/= 101). 02/10/20   Sheikh, Omair Latif, DO  amLODipine (NORVASC) 10 MG tablet Take 1 tablet (10 mg total) by mouth daily. 02/11/20   Raiford Noble Latif, DO  carvedilol (COREG) 25 MG tablet Take 1 tablet (25 mg total) by mouth 2 (two) times daily with a meal. 02/10/20   Raiford Noble Latif, DO  clopidogrel (PLAVIX) 75 MG tablet Take 1 tablet (75 mg total) by mouth daily. 02/11/20   Raiford Noble Latif, DO  diclofenac Sodium (VOLTAREN) 1 % GEL Apply 2 g topically 4 (four) times daily. 02/10/20   Raiford Noble Latif, DO  DULoxetine (CYMBALTA) 30 MG capsule Take 1 capsule (30 mg total) by mouth 2 (two) times daily. 02/15/18   Patrecia Pour, NP  gabapentin (NEURONTIN) 300 MG capsule Take 1 capsule (300 mg total) by mouth 3 (three) times daily. 02/19/18   Patrecia Pour, NP  hydrALAZINE (APRESOLINE) 25 MG tablet Take 1 tablet (25 mg total) by mouth 3 (three) times daily. 02/10/20   Sheikh, Omair Latif, DO  Lido-Capsaicin-Men-Methyl Sal (MEDI-PATCH-LIDOCAINE EX) Apply topically.    [provider]  losartan (COZAAR) 50 MG tablet Take 1 tablet (50 mg total) by mouth daily. 02/11/20   Raiford Noble Latif, DO  metFORMIN (GLUCOPHAGE) 500 MG tablet Take 1 tablet (500 mg total) by mouth 2 (two) times daily with a meal. 02/10/20   Sheikh, Omair Latif, DO  ondansetron (ZOFRAN) 4 MG tablet Take 1 tablet (4 mg total) by mouth every 6 (six) hours as needed for nausea. 02/10/20   Raiford Noble Latif, DO  paliperidone (INVEGA) 3 MG 24 hr tablet Take 1 tablet (3 mg total) by mouth daily. 02/15/18    Patrecia Pour, NP  traMADol (ULTRAM) 50 MG tablet Take 50 mg by mouth every 6 (six) hours as needed.    [provider]    Physical Exam: Vitals:   04/17/20 2100 04/17/20 2150 04/17/20 2209  BP:  (!) 173/105   Pulse:  79   Resp:  18   Temp:  98.3 F (36.8 C)   SpO2:  99%   Weight: 101.1 kg  101.1 kg   Constitutional: Resting supine in bed, NAD, calm, comfortable Eyes: PERRL, EOMI, lids and conjunctivae normal ENMT: Mucous membranes are dry. Posterior pharynx clear of any exudate or lesions.multiple missing teeth. Neck: normal, supple, no masses. Respiratory: clear to auscultation bilaterally, no wheezing, no crackles. Normal respiratory effort. No accessory muscle use.  Cardiovascular: Regular rate and rhythm, no murmurs / rubs / gallops. No extremity edema. 2+ pedal pulses. Abdomen: no tenderness, no masses palpated. No hepatosplenomegaly. Bowel sounds positive.  Musculoskeletal: no clubbing / cyanosis. No  joint deformity upper and lower extremities. Good ROM, no contractures. Normal muscle tone.  Skin: no rashes, lesions, ulcers. No induration Neurologic: Dysarthric speech, tongue deviates to the right slightly on tongue protrusion otherwise CN 2-12 grossly intact. Sensation intact, Strength slightly diminished with right upper and lower extremities otherwise 5/5 in all other extremities.  Psychiatric: Dysarthric speech otherwise normal judgment and insight, alert and oriented x 3. Normal mood.   Labs on Admission: I have personally reviewed following labs and imaging studies  CBC: Recent Labs  Lab 04/17/20 2146 04/17/20 2150  WBC  --  7.7  NEUTROABS  --  3.9  HGB 12.9* 12.5*  HCT 38.0* 39.1  MCV  --  85.2  PLT  --  008   Basic Metabolic Panel: Recent Labs  Lab 04/17/20 2146 04/17/20 2150  NA 142 139  K 3.7 3.7  CL 106 106  CO2  --  25  GLUCOSE 141* 143*  BUN 23* 23*  CREATININE 1.40* 1.40*  CALCIUM  --  9.3   GFR: Estimated Creatinine Clearance:  70.5 mL/min (A) (by C-G formula based on SCr of 1.4 mg/dL (H)). Liver Function Tests: Recent Labs  Lab 04/17/20 2150  AST 21  ALT 11  ALKPHOS 75  BILITOT 0.8  PROT 7.5  ALBUMIN 4.1   No results for input(s): LIPASE, AMYLASE in the last 168 hours. No results for input(s): AMMONIA in the last 168 hours. Coagulation Profile: Recent Labs  Lab 04/17/20 2150  INR 0.9   Cardiac Enzymes: No results for input(s): CKTOTAL, CKMB, CKMBINDEX, TROPONINI in the last 168 hours. BNP (last 3 results) No results for input(s): PROBNP in the last 8760 hours. HbA1C: No results for input(s): HGBA1C in the last 72 hours. CBG: Recent Labs  Lab 04/17/20 2147  GLUCAP 141*   Lipid Profile: No results for input(s): CHOL, HDL, LDLCALC, TRIG, CHOLHDL, LDLDIRECT in the last 72 hours. Thyroid Function Tests: No results for input(s): TSH, T4TOTAL, FREET4, T3FREE, THYROIDAB in the last 72 hours. Anemia Panel: No results for input(s): VITAMINB12, FOLATE, FERRITIN, TIBC, IRON, RETICCTPCT in the last 72 hours. Urine analysis:    Component Value Date/Time   COLORURINE YELLOW 04/17/2020 2218   APPEARANCEUR CLEAR 04/17/2020 2218   LABSPEC 1.020 04/17/2020 2218   PHURINE 6.0 04/17/2020 2218   GLUCOSEU NEGATIVE 04/17/2020 2218   HGBUR NEGATIVE 04/17/2020 2218   BILIRUBINUR NEGATIVE 04/17/2020 2218   [place] 04/17/2020 2218   PROTEINUR 100 (A) 04/17/2020 2218   UROBILINOGEN 0.2 08/27/2015 0210   NITRITE NEGATIVE 04/17/2020 2218   LEUKOCYTESUR NEGATIVE 04/17/2020 2218    Radiological Exams on Admission: DG Chest 1 View  Result Date: 04/17/2020 CLINICAL DATA:  59 year old male with cough and chest pain. EXAM: CHEST  1 VIEW COMPARISON:  Chest radiograph dated 02/07/2020. FINDINGS: The heart size and mediastinal contours are within normal limits. Both lungs are clear. The visualized skeletal structures are unremarkable. IMPRESSION: No active disease. Electronically Signed   By: Anner Crete  M.D.   On: 04/17/2020 22:36   CT HEAD CODE STROKE WO CONTRAST  Result Date: 04/17/2020 CLINICAL DATA:  Code stroke.  Right-sided facial droop EXAM: CT HEAD WITHOUT CONTRAST TECHNIQUE: Contiguous axial images were obtained from the base of the skull through the vertex without intravenous contrast. COMPARISON:  03/03/2018 FINDINGS: Brain: There is no mass, hemorrhage or extra-axial collection. The size and configuration of the ventricles and extra-axial CSF spaces are normal. There is hypoattenuation of the periventricular white matter, most commonly indicating  chronic ischemic microangiopathy. There are small vessel infarcts of the left basal ganglia that are likely chronic. Vascular: No abnormal hyperdensity of the major intracranial arteries or dural venous sinuses. No intracranial atherosclerosis. Skull: The visualized skull base, calvarium and extracranial soft tissues are normal. Sinuses/Orbits: No fluid levels or advanced mucosal thickening of the visualized paranasal sinuses. No mastoid or middle ear effusion. The orbits are normal. ASPECTS [facility] Stroke Program Early CT Score) - Ganglionic level infarction (caudate, lentiform nuclei, internal capsule, insula, M1-M3 cortex): 7 - Supraganglionic infarction (M4-M6 cortex): 3 Total score (0-10 with 10 being normal): 10 IMPRESSION: 1. No acute intracranial abnormality. 2. ASPECTS is 10. 3. Chronic ischemic microangiopathy and small vessel infarcts of the left basal ganglia that are likely old. 4. These results were communicated to Dr. Amie Portland at 9:58 pm on 04/17/2020 by text page via the [facility] messaging system. Electronically Signed   By: Ulyses Jarred M.D.   On: 04/17/2020 21:58    EKG: Independently reviewed.  Normal sinus rhythm, low voltage in extremity leads, no acute ischemic changes.  Assessment/Plan Principal Problem:   Right sided weakness Active Problems:   Diabetes mellitus ([place])   Hypertension associated with diabetes ([place])    Hyperlipidemia associated with type 2 diabetes mellitus ([facility])   Schizoaffective disorder, bipolar type ([place])   AKI (acute kidney injury) ([place])   History of CVA with residual deficit   Chronic combined systolic (congestive) and diastolic (congestive) heart failure ([place])   Substance use disorder  Calab Sachse is a 59 y.o. male with medical history significant for history of stroke with residual right-sided weakness and neurocognitive impairment, chronic combined systolic and diastolic CHF, type 2 diabetes, hypertension, hyperlipidemia, schizoaffective disorder, chronic left hip pain, and history of polysubstance use disorder who is admitted for evaluation of acute on chronic right-sided weakness and dysarthria.  Acute on chronic right-sided weakness and dysarthria History of CVA with residual right-sided weakness: Patient presenting with worsening right-sided weakness and dysarthria compared to his baseline.  Neurology is following for further evaluation of acute ischemic stroke versus recrudescence of old stroke symptoms. -CT head negative for acute changes -MRI brain, MRA head and neck without contrast are ordered and pending -Continue home Plavix 75 mg daily -Add atorvastatin 40 mg daily -Continue telemetry, neurochecks -Obtain echocardiogram -PT/OT/SLP eval -Check A1c and lipid panel -UA and CXR without evidence of infection -UDS and EtOH levels negative -Will allow for permissive hypertension for now up to 220/120, use IV labetalol as needed  AKI: Creatinine 1.4 on admission, baseline appears to be between 1.1-1.2.  Patient appears dehydrated therefore will place on gentle IV NS @ 75 mL hr overnight.  Hold home losartan and limit NSAIDs.  Repeat labs in the morning.  Hypertension: Holding home amlodipine, Coreg, hydralazine, and losartan to allow for permissive hypertension at this time.  Nonischemic chronic combined systolic and diastolic CHF: Last echocardiogram 02/01/2018  showed EF 40-45%, G2 DD, and hypokinesis of anterolateral, inferolateral, and inferior myocardium.  Patient appears dehydrated on admission.  He is receiving gentle IV fluids overnight as above.  He is not requiring diuretics as an outpatient. -Monitor strict I/O's and daily weights -Coreg and losartan on hold for now as above  Type 2 diabetes: Holding home Metformin with elevated creatinine.  Place on sensitive SSI and adjust as needed.  Hyperlipidemia: Start atorvastatin 40 mg daily.  Schizoaffective disorder: Continue home Invega and Cymbalta.  History of polysubstance use disorder: Patient denies any recent illicit drug, alcohol, or tobacco  use.  UDS and EtOH levels are negative.  DVT prophylaxis: Lovenox Code Status: Full code, confirmed with patient Family Communication: Discussed with patient, he has discussed with family Disposition Plan: From home, discharge pending further stroke work-up and PT/OT/SLP eval Consults called: Neurology Admission status:  Status is: Observation  The patient remains OBS appropriate and will d/c before 2 midnights.  Dispo: The patient is from: Home              Anticipated d/c is to: TBD pending further stroke work-up and PT/OT/SLP eval              Anticipated d/c date is: 1 day pending further stroke work-up and PT/OT/SLP eval.              Patient currently is not medically stable to d/c.   Zada Finders MD [facility]  If 7PM-7AM, please contact night-coverage [email or web address]  04/18/2020, 12:26 AM

## 2020-04-18 ENCOUNTER — Observation Stay (HOSPITAL_COMMUNITY): Payer: Medicaid Other

## 2020-04-18 ENCOUNTER — Inpatient Hospital Stay (HOSPITAL_COMMUNITY): Payer: Medicaid Other

## 2020-04-18 DIAGNOSIS — I6302 Cerebral infarction due to thrombosis of basilar artery: Secondary | ICD-10-CM | POA: Diagnosis not present

## 2020-04-18 DIAGNOSIS — M109 Gout, unspecified: Secondary | ICD-10-CM | POA: Diagnosis present

## 2020-04-18 DIAGNOSIS — I69391 Dysphagia following cerebral infarction: Secondary | ICD-10-CM | POA: Diagnosis not present

## 2020-04-18 DIAGNOSIS — N185 Chronic kidney disease, stage 5: Secondary | ICD-10-CM | POA: Diagnosis not present

## 2020-04-18 DIAGNOSIS — R29703 NIHSS score 3: Secondary | ICD-10-CM | POA: Diagnosis present

## 2020-04-18 DIAGNOSIS — F17210 Nicotine dependence, cigarettes, uncomplicated: Secondary | ICD-10-CM | POA: Diagnosis present

## 2020-04-18 DIAGNOSIS — F250 Schizoaffective disorder, bipolar type: Secondary | ICD-10-CM | POA: Diagnosis present

## 2020-04-18 DIAGNOSIS — E860 Dehydration: Secondary | ICD-10-CM | POA: Diagnosis present

## 2020-04-18 DIAGNOSIS — Z20822 Contact with and (suspected) exposure to covid-19: Secondary | ICD-10-CM | POA: Diagnosis present

## 2020-04-18 DIAGNOSIS — E785 Hyperlipidemia, unspecified: Secondary | ICD-10-CM | POA: Diagnosis present

## 2020-04-18 DIAGNOSIS — E1169 Type 2 diabetes mellitus with other specified complication: Secondary | ICD-10-CM | POA: Diagnosis present

## 2020-04-18 DIAGNOSIS — I639 Cerebral infarction, unspecified: Secondary | ICD-10-CM | POA: Diagnosis present

## 2020-04-18 DIAGNOSIS — E0822 Diabetes mellitus due to underlying condition with diabetic chronic kidney disease: Secondary | ICD-10-CM | POA: Diagnosis not present

## 2020-04-18 DIAGNOSIS — I152 Hypertension secondary to endocrine disorders: Secondary | ICD-10-CM | POA: Diagnosis present

## 2020-04-18 DIAGNOSIS — I5042 Chronic combined systolic (congestive) and diastolic (congestive) heart failure: Secondary | ICD-10-CM | POA: Diagnosis present

## 2020-04-18 DIAGNOSIS — F1010 Alcohol abuse, uncomplicated: Secondary | ICD-10-CM | POA: Diagnosis present

## 2020-04-18 DIAGNOSIS — Z794 Long term (current) use of insulin: Secondary | ICD-10-CM | POA: Diagnosis not present

## 2020-04-18 DIAGNOSIS — I6350 Cerebral infarction due to unspecified occlusion or stenosis of unspecified cerebral artery: Secondary | ICD-10-CM | POA: Diagnosis not present

## 2020-04-18 DIAGNOSIS — I6930 Unspecified sequelae of cerebral infarction: Secondary | ICD-10-CM | POA: Diagnosis not present

## 2020-04-18 DIAGNOSIS — G8929 Other chronic pain: Secondary | ICD-10-CM | POA: Diagnosis present

## 2020-04-18 DIAGNOSIS — E669 Obesity, unspecified: Secondary | ICD-10-CM | POA: Diagnosis present

## 2020-04-18 DIAGNOSIS — E1165 Type 2 diabetes mellitus with hyperglycemia: Secondary | ICD-10-CM | POA: Diagnosis present

## 2020-04-18 DIAGNOSIS — I110 Hypertensive heart disease with heart failure: Secondary | ICD-10-CM | POA: Diagnosis present

## 2020-04-18 DIAGNOSIS — I6389 Other cerebral infarction: Secondary | ICD-10-CM

## 2020-04-18 DIAGNOSIS — R471 Dysarthria and anarthria: Secondary | ICD-10-CM | POA: Diagnosis present

## 2020-04-18 DIAGNOSIS — R531 Weakness: Secondary | ICD-10-CM | POA: Diagnosis not present

## 2020-04-18 DIAGNOSIS — I69351 Hemiplegia and hemiparesis following cerebral infarction affecting right dominant side: Secondary | ICD-10-CM | POA: Diagnosis not present

## 2020-04-18 DIAGNOSIS — M5416 Radiculopathy, lumbar region: Secondary | ICD-10-CM | POA: Diagnosis not present

## 2020-04-18 DIAGNOSIS — M25552 Pain in left hip: Secondary | ICD-10-CM | POA: Diagnosis present

## 2020-04-18 DIAGNOSIS — M79609 Pain in unspecified limb: Secondary | ICD-10-CM | POA: Diagnosis not present

## 2020-04-18 DIAGNOSIS — I10 Essential (primary) hypertension: Secondary | ICD-10-CM | POA: Diagnosis not present

## 2020-04-18 DIAGNOSIS — E876 Hypokalemia: Secondary | ICD-10-CM | POA: Diagnosis present

## 2020-04-18 DIAGNOSIS — R05 Cough: Secondary | ICD-10-CM | POA: Diagnosis not present

## 2020-04-18 DIAGNOSIS — F251 Schizoaffective disorder, depressive type: Secondary | ICD-10-CM | POA: Diagnosis present

## 2020-04-18 DIAGNOSIS — R0602 Shortness of breath: Secondary | ICD-10-CM | POA: Diagnosis not present

## 2020-04-18 DIAGNOSIS — I5032 Chronic diastolic (congestive) heart failure: Secondary | ICD-10-CM | POA: Diagnosis not present

## 2020-04-18 DIAGNOSIS — G8191 Hemiplegia, unspecified affecting right dominant side: Secondary | ICD-10-CM | POA: Diagnosis present

## 2020-04-18 DIAGNOSIS — I6300 Cerebral infarction due to thrombosis of unspecified precerebral artery: Secondary | ICD-10-CM | POA: Diagnosis not present

## 2020-04-18 DIAGNOSIS — F1990 Other psychoactive substance use, unspecified, uncomplicated: Secondary | ICD-10-CM | POA: Diagnosis not present

## 2020-04-18 DIAGNOSIS — I69318 Other symptoms and signs involving cognitive functions following cerebral infarction: Secondary | ICD-10-CM | POA: Diagnosis not present

## 2020-04-18 DIAGNOSIS — N179 Acute kidney failure, unspecified: Secondary | ICD-10-CM | POA: Diagnosis present

## 2020-04-18 DIAGNOSIS — E1159 Type 2 diabetes mellitus with other circulatory complications: Secondary | ICD-10-CM | POA: Diagnosis not present

## 2020-04-18 LAB — RENAL FUNCTION PANEL
Albumin: 3.7 g/dL (ref 3.5–5.0)
Anion gap: 13 (ref 5–15)
BUN: 15 mg/dL (ref 6–20)
CO2: 25 mmol/L (ref 22–32)
Calcium: 9.1 mg/dL (ref 8.9–10.3)
Chloride: 101 mmol/L (ref 98–111)
Creatinine, Ser: 1.19 mg/dL (ref 0.61–1.24)
GFR calc Af Amer: >60 mL/min (ref >60)
GFR calc non Af Amer: >60 mL/min (ref >60)
Glucose, Bld: 167 mg/dL — ABNORMAL HIGH (ref 70–99)
Phosphorus: 3.5 mg/dL (ref 2.5–4.6)
Potassium: 3.3 mmol/L — ABNORMAL LOW (ref 3.5–5.1)
Sodium: 139 mmol/L (ref 135–145)

## 2020-04-18 LAB — GLUCOSE, CAPILLARY
Glucose-Capillary: 148 mg/dL — ABNORMAL HIGH (ref 70–99)
Glucose-Capillary: 160 mg/dL — ABNORMAL HIGH (ref 70–99)
Glucose-Capillary: 182 mg/dL — ABNORMAL HIGH (ref 70–99)
Glucose-Capillary: 128 mg/dL — ABNORMAL HIGH (ref 70–99)
Glucose-Capillary: 178 mg/dL — ABNORMAL HIGH (ref 70–99)

## 2020-04-18 LAB — ECHOCARDIOGRAM COMPLETE
Height: 71 in
Weight: 3537.94 [oz_av]

## 2020-04-18 LAB — LIPID PANEL
Cholesterol: 195 mg/dL (ref 0–200)
HDL: 44 mg/dL (ref >40)
LDL Cholesterol: 114 mg/dL — ABNORMAL HIGH (ref 0–99)
Total CHOL/HDL Ratio: 4.4 RATIO
Triglycerides: 183 mg/dL — ABNORMAL HIGH (ref <150)
VLDL: 37 mg/dL (ref 0–40)

## 2020-04-18 LAB — HEMOGLOBIN A1C
Hgb A1c MFr Bld: 7.7 % — ABNORMAL HIGH (ref 4.8–5.6)
Mean Plasma Glucose: 174.29 mg/dL

## 2020-04-18 LAB — SARS CORONAVIRUS 2 BY RT PCR (HOSPITAL ORDER, PERFORMED IN ~~LOC~~ HOSPITAL LAB): SARS Coronavirus 2: NEGATIVE

## 2020-04-18 MED ORDER — ACETAMINOPHEN 650 MG RE SUPP: 650.0000 mg | RECTAL | Status: DC | PRN

## 2020-04-18 MED ORDER — ACETAMINOPHEN 325 MG PO TABS
650.0000 mg | ORAL_TABLET | ORAL | Status: DC | PRN
  Administered 2020-04-18: 650 mg via ORAL
  Filled 2020-04-18: qty 2

## 2020-04-18 MED ORDER — DULOXETINE HCL 30 MG PO CPEP
30.0000 mg | ORAL_CAPSULE | Freq: Two times a day (BID) | ORAL | Status: DC
  Administered 2020-04-18 – 2020-04-20 (×4): 30 mg via ORAL
  Filled 2020-04-18 (×6): qty 1

## 2020-04-18 MED ORDER — ENOXAPARIN SODIUM 40 MG/0.4ML ~~LOC~~ SOLN
40.0000 mg | SUBCUTANEOUS | Status: DC
  Administered 2020-04-18 – 2020-04-20 (×3): 40 mg via SUBCUTANEOUS
  Filled 2020-04-18 (×3): qty 0.4

## 2020-04-18 MED ORDER — STROKE: EARLY STAGES OF RECOVERY BOOK
Freq: Once | Status: AC
  Filled 2020-04-18: qty 1

## 2020-04-18 MED ORDER — SENNOSIDES-DOCUSATE SODIUM 8.6-50 MG PO TABS: 1.0000 | ORAL_TABLET | Freq: Every evening | ORAL | Status: DC | PRN

## 2020-04-18 MED ORDER — LABETALOL HCL 5 MG/ML IV SOLN
10.0000 mg | INTRAVENOUS | Status: DC | PRN
  Administered 2020-04-19 – 2020-04-20 (×2): 10 mg via INTRAVENOUS
  Filled 2020-04-18 (×2): qty 4

## 2020-04-18 MED ORDER — PALIPERIDONE ER 3 MG PO TB24
3.0000 mg | ORAL_TABLET | Freq: Every day | ORAL | Status: DC
  Administered 2020-04-18 – 2020-04-20 (×3): 3 mg via ORAL
  Filled 2020-04-18 (×3): qty 1

## 2020-04-18 MED ORDER — DICLOFENAC SODIUM 1 % EX GEL
2.0000 g | Freq: Four times a day (QID) | CUTANEOUS | Status: DC
  Administered 2020-04-18 (×3): 2 g via TOPICAL
  Filled 2020-04-18: qty 100

## 2020-04-18 MED ORDER — CLOPIDOGREL BISULFATE 75 MG PO TABS
75.0000 mg | ORAL_TABLET | Freq: Every day | ORAL | Status: DC
  Administered 2020-04-18 – 2020-04-20 (×3): 75 mg via ORAL
  Filled 2020-04-18 (×3): qty 1

## 2020-04-18 MED ORDER — SODIUM CHLORIDE 0.9 % IV SOLN: INTRAVENOUS | Status: AC

## 2020-04-18 MED ORDER — ATORVASTATIN CALCIUM 40 MG PO TABS
40.0000 mg | ORAL_TABLET | Freq: Every day | ORAL | Status: DC
  Administered 2020-04-18 – 2020-04-20 (×3): 40 mg via ORAL
  Filled 2020-04-18 (×3): qty 1

## 2020-04-18 MED ORDER — INSULIN ASPART 100 UNIT/ML ~~LOC~~ SOLN
0.0000 [IU] | Freq: Three times a day (TID) | SUBCUTANEOUS | Status: DC
  Administered 2020-04-18: 1 [IU] via SUBCUTANEOUS
  Administered 2020-04-18 – 2020-04-20 (×5): 2 [IU] via SUBCUTANEOUS
  Administered 2020-04-20: 1 [IU] via SUBCUTANEOUS

## 2020-04-18 MED ORDER — CHLORHEXIDINE GLUCONATE 0.12 % MT SOLN
15.0000 mL | Freq: Two times a day (BID) | OROMUCOSAL | Status: DC
  Administered 2020-04-19 – 2020-04-20 (×3): 15 mL via OROMUCOSAL
  Filled 2020-04-18 (×4): qty 15

## 2020-04-18 MED ORDER — ASPIRIN EC 325 MG PO TBEC
325.0000 mg | DELAYED_RELEASE_TABLET | Freq: Every day | ORAL | Status: DC
  Administered 2020-04-19 – 2020-04-20 (×2): 325 mg via ORAL
  Filled 2020-04-18 (×2): qty 1

## 2020-04-18 MED ORDER — ORAL CARE MOUTH RINSE
15.0000 mL | Freq: Two times a day (BID) | OROMUCOSAL | Status: DC
  Administered 2020-04-18 – 2020-04-20 (×4): 15 mL via OROMUCOSAL

## 2020-04-18 MED ORDER — IOHEXOL 350 MG/ML SOLN
75.0000 mL | Freq: Once | INTRAVENOUS | Status: AC | PRN
  Administered 2020-04-18: 75 mL via INTRAVENOUS

## 2020-04-18 MED ORDER — ACETAMINOPHEN 160 MG/5ML PO SOLN: 650.0000 mg | ORAL | Status: DC | PRN

## 2020-04-18 MED ORDER — GABAPENTIN 300 MG PO CAPS
300.0000 mg | ORAL_CAPSULE | Freq: Three times a day (TID) | ORAL | Status: DC
  Administered 2020-04-18 – 2020-04-20 (×6): 300 mg via ORAL
  Filled 2020-04-18 (×7): qty 1

## 2020-04-18 MED ORDER — ASPIRIN EC 81 MG PO TBEC
81.0000 mg | DELAYED_RELEASE_TABLET | Freq: Every day | ORAL | Status: DC
  Administered 2020-04-18: 81 mg via ORAL
  Filled 2020-04-18: qty 1

## 2020-04-18 NOTE — Evaluation (Addendum)
Physical Therapy Evaluation Patient Details Name: [name] MRN: [id number] DOB: 11/17/1961 Today's Date: 04/18/2020   History of Present Illness  Pt adm with worsening rt sided weakness and dysarthria from his baseline. Pt with prior CVA with residual rt sided weakness and neurocognitive impairment. MRI showed small acute infarct of left pons.  PMH -  Lt CVA, chf, DM, HTN, schizoaffective disorder, chronic lt hip pain, polysubstance abuse, gout.   Clinical Impression  Pt presents to PT with decr mobility after suffering new lt CVA. Pt had prior lt CVA but was much more mobile and independent prior to this CVA. He was able to get up and amb with rolling walker on his own prior to this. Now requiring 2 person assist to stand. Feel he can benefit from CIR.     Follow Up Recommendations CIR;Supervision/Assistance - 24 hour    Equipment Recommendations  Other (comment)(To be determined)    Recommendations for Other Services       Precautions / Restrictions Precautions Precautions: Fall      Mobility  Bed Mobility Overal bed mobility: Needs Assistance Bed Mobility: Sidelying to Sit   Sidelying to sit: Mod assist       General bed mobility comments: Assist to bring RLE off of bed, elevate trunk into sitting and bring hips to EOB.  Transfers Overall transfer level: Needs assistance Equipment used: Rolling walker (2 wheeled);Ambulation equipment used Transfers: Sit to/from Stand Sit to Stand: +2 physical assistance;Mod assist;Max assist         General transfer comment: +2 max assist to bring hips up and for balance using rolling walker with pt not fully extending hips or rt knee. Pt also difficulty placing rt hand on walker. With Stedy pt +2 mod assist and able to achieve full upright posture.   Ambulation/Gait             General Gait Details: unable to attempt  Stairs            Wheelchair Mobility    Modified Rankin (Stroke Patients Only)        Balance Overall balance assessment: Needs assistance Sitting-balance support: Single extremity supported;Feet supported Sitting balance-Leahy Scale: Poor Sitting balance - Comments: UE support and min guard. Falls to rt if not holding bed rail with LUE Postural control: Right lateral lean   Standing balance-Leahy Scale: Poor Standing balance comment: Stood with Stedy x 20 sec with +2 min assist. With rolling walker +2 mod/max assist with flexed posture.                              Pertinent Vitals/Pain Pain Assessment: No/denies pain Faces Pain Scale: No hurt    Home Living Family/patient expects to be discharged to:: Private residence Living Arrangements: Children Available Help at Discharge: Family;Available 24 hours/day Type of Home: House Home Access: Stairs to enter Entrance Stairs-Rails: None Entrance Stairs-Number of Steps: 3 Home Layout: One level Home Equipment: Environmental consultant - 2 wheels      Prior Function Level of Independence: Needs assistance   Gait / Transfers Assistance Needed: amb modified independent with rolling walker           Hand Dominance   Dominant Hand: Right    Extremity/Trunk Assessment   Upper Extremity Assessment Upper Extremity Assessment: Defer to OT evaluation    Lower Extremity Assessment Lower Extremity Assessment: RLE deficits/detail RLE Deficits / Details: grossly 3+/5 strength  Communication   Communication: Expressive difficulties(dysarthric)  Cognition Arousal/Alertness: Awake/alert Behavior During Therapy: WFL for tasks assessed/performed Overall Cognitive Status: No family/caregiver present to determine baseline cognitive functioning                                 General Comments: Pt following 1 step commands.       General Comments      Exercises     Assessment/Plan    PT Assessment Patient needs continued PT services  PT Problem List Decreased strength;Decreased activity  tolerance;Decreased balance;Decreased mobility       PT Treatment Interventions DME instruction;Gait training;Stair training;Functional mobility training;Therapeutic activities;Therapeutic exercise;Balance training;Cognitive remediation;Neuromuscular re-education;Patient/family education    PT Goals (Current goals can be found in the Care Plan section)  Acute Rehab PT Goals Patient Stated Goal: go home PT Goal Formulation: With patient Time For Goal Achievement: 05/02/20 Potential to Achieve Goals: Good    Frequency Min 4X/week   Barriers to discharge Inaccessible home environment stairs to enter    Co-evaluation               AM-PAC PT "6 Clicks" Mobility  Outcome Measure Help needed turning from your back to your side while in a flat bed without using bedrails?: A Lot Help needed moving from lying on your back to sitting on the side of a flat bed without using bedrails?: A Lot Help needed moving to and from a bed to a chair (including a wheelchair)?: Total Help needed standing up from a chair using your arms (e.g., wheelchair or bedside chair)?: A Lot Help needed to walk in hospital room?: Total Help needed climbing 3-5 steps with a railing? : Total 6 Click Score: 9    End of Session Equipment Utilized During Treatment: Gait belt Activity Tolerance: Patient tolerated treatment well Patient left: in chair;with call bell/phone within reach;with chair alarm set Nurse Communication: Mobility status;Need for lift equipment(nurse tech) PT Visit Diagnosis: Unsteadiness on feet (R26.81);Other abnormalities of gait and mobility (R26.89);Hemiplegia and hemiparesis Hemiplegia - Right/Left: Right Hemiplegia - dominant/non-dominant: Dominant Hemiplegia - caused by: Cerebral infarction    Time: 1015-1035 PT Time Calculation (min) (ACUTE ONLY): 20 min   Charges:   PT Evaluation $PT Eval Moderate Complexity: 1 [place] Pager [phone] Office [place] 04/18/2020, 1:17 PM

## 2020-04-18 NOTE — Progress Notes (Signed)
STROKE TEAM PROGRESS NOTE   INTERVAL HISTORY Pt sitting in chair. He still has moderate dysarthria, right facial droop and right hemiparesis. He stated compliance with medication. He denies any cocaine use. He said he had 2 previous stroke in the past, and quit cocaine after the second stroke. However, in our record, he had pontine stroke in 2013.   Vitals:   04/18/20 0144 04/18/20 0548 04/18/20 0603 04/18/20 0810  BP:  (!) 172/126 (!) 156/111 (!) 171/107  Pulse:  83 78 76  Resp:  14 10 20   Temp: 98.4 F (36.9 C) 97.6 F (36.4 C)  97.9 F (36.6 C)  TempSrc: Oral Other (Comment)  Oral  SpO2:  99% 97% 100%  Weight:      Height:        CBC:  Recent Labs  Lab 04/17/20 2146 04/17/20 2150  WBC  --  7.7  NEUTROABS  --  3.9  HGB 12.9* 12.5*  HCT 38.0* 39.1  MCV  --  85.2  PLT  --  712    Basic Metabolic Panel:  Recent Labs  Lab 04/17/20 2150 04/18/20 0556  NA 139 139  K 3.7 3.3*  CL 106 101  CO2 25 25  GLUCOSE 143* 167*  BUN 23* 15  CREATININE 1.40* 1.19  CALCIUM 9.3 9.1  PHOS  --  3.5   Lipid Panel:     Component Value Date/Time   CHOL 195 04/18/2020 0556   TRIG 183 (H) 04/18/2020 0556   HDL 44 04/18/2020 0556   CHOLHDL 4.4 04/18/2020 0556   VLDL 37 04/18/2020 0556   LDLCALC 114 (H) 04/18/2020 0556   HgbA1c:  Lab Results  Component Value Date   HGBA1C 7.7 (H) 04/18/2020   Urine Drug Screen:     Component Value Date/Time   LABOPIA NONE DETECTED 04/17/2020 2218   COCAINSCRNUR NONE DETECTED 04/17/2020 2218   LABBENZ NONE DETECTED 04/17/2020 2218   AMPHETMU NONE DETECTED 04/17/2020 2218   THCU NONE DETECTED 04/17/2020 2218   LABBARB NONE DETECTED 04/17/2020 2218    Alcohol Level     Component Value Date/Time   ETH <10 04/17/2020 2226    IMAGING past 24 hours CT ANGIO HEAD W OR WO CONTRAST  Result Date: 04/18/2020 CLINICAL DATA:  Stroke follow-up EXAM: CT ANGIOGRAPHY HEAD AND NECK TECHNIQUE: Multidetector CT imaging of the head and neck was  performed using the standard protocol during bolus administration of intravenous contrast. Multiplanar CT image reconstructions and MIPs were obtained to evaluate the vascular anatomy. Carotid stenosis measurements (when applicable) are obtained utilizing NASCET criteria, using the distal internal carotid diameter as the denominator. CONTRAST:  49mL OMNIPAQUE IOHEXOL 350 MG/ML SOLN COMPARISON:  Brain MRI and MRA from earlier today FINDINGS: CTA NECK FINDINGS Aortic arch: Atheromatous wall thickening.  No acute finding. Right carotid system: Tortuous with partial retropharyngeal course. Mild atheromatous changes at the bifurcation and distal common carotid. No flow limiting stenosis or ulceration. Left carotid system: Mild atheromatous plaque at the proximal common carotid. No stenosis or ulceration. Vertebral arteries: No proximal subclavian stenosis. The codominant vertebral arteries are smooth and widely patent to the dura. Skeleton: Advanced mid cervical disc degeneration with disc space collapse and endplate sclerosis. Other neck: No acute finding. Upper chest: No acute finding Review of the MIP images confirms the above findings CTA HEAD FINDINGS Anterior circulation: Atheromatous plaque along the bilateral carotid siphons with 45% left periophthalmic ICA narrowing and 75% narrowing at the supraclinoid right ICA. Severe bilateral pericallosal  stenosis with areas of imperceptible luminal enhancement. Severe left A1 origin stenosis. Accounting for atherosclerotic irregularity no aneurysm is seen. Posterior circulation: The vertebral and basilar arteries are smooth and widely patent. Advanced atheromatous type narrowing of bilateral P4 branches. Venous sinuses: Diffusely patent Anatomic variants: None significant Review of the MIP images confirms the above findings IMPRESSION: 1. Advanced intracranial atherosclerosis with high-grade narrowings at the supraclinoid right ICA, left A1 origin, bilateral pericallosal  arteries, and bilateral PCA branches. 2. Cervical carotid atherosclerosis without flow limiting stenosis in the neck. Electronically Signed   By: Monte Fantasia M.D.   On: 04/18/2020 08:14   DG Chest 1 View  Result Date: 04/17/2020 CLINICAL DATA:  59 year old male with cough and chest pain. EXAM: CHEST  1 VIEW COMPARISON:  Chest radiograph dated 02/07/2020. FINDINGS: The heart size and mediastinal contours are within normal limits. Both lungs are clear. The visualized skeletal structures are unremarkable. IMPRESSION: No active disease. Electronically Signed   By: Anner Crete M.D.   On: 04/17/2020 22:36   CT ANGIO NECK W OR WO CONTRAST  Result Date: 04/18/2020 CLINICAL DATA:  Stroke follow-up EXAM: CT ANGIOGRAPHY HEAD AND NECK TECHNIQUE: Multidetector CT imaging of the head and neck was performed using the standard protocol during bolus administration of intravenous contrast. Multiplanar CT image reconstructions and MIPs were obtained to evaluate the vascular anatomy. Carotid stenosis measurements (when applicable) are obtained utilizing NASCET criteria, using the distal internal carotid diameter as the denominator. CONTRAST:  78mL OMNIPAQUE IOHEXOL 350 MG/ML SOLN COMPARISON:  Brain MRI and MRA from earlier today FINDINGS: CTA NECK FINDINGS Aortic arch: Atheromatous wall thickening.  No acute finding. Right carotid system: Tortuous with partial retropharyngeal course. Mild atheromatous changes at the bifurcation and distal common carotid. No flow limiting stenosis or ulceration. Left carotid system: Mild atheromatous plaque at the proximal common carotid. No stenosis or ulceration. Vertebral arteries: No proximal subclavian stenosis. The codominant vertebral arteries are smooth and widely patent to the dura. Skeleton: Advanced mid cervical disc degeneration with disc space collapse and endplate sclerosis. Other neck: No acute finding. Upper chest: No acute finding Review of the MIP images confirms the  above findings CTA HEAD FINDINGS Anterior circulation: Atheromatous plaque along the bilateral carotid siphons with 45% left periophthalmic ICA narrowing and 75% narrowing at the supraclinoid right ICA. Severe bilateral pericallosal stenosis with areas of imperceptible luminal enhancement. Severe left A1 origin stenosis. Accounting for atherosclerotic irregularity no aneurysm is seen. Posterior circulation: The vertebral and basilar arteries are smooth and widely patent. Advanced atheromatous type narrowing of bilateral P4 branches. Venous sinuses: Diffusely patent Anatomic variants: None significant Review of the MIP images confirms the above findings IMPRESSION: 1. Advanced intracranial atherosclerosis with high-grade narrowings at the supraclinoid right ICA, left A1 origin, bilateral pericallosal arteries, and bilateral PCA branches. 2. Cervical carotid atherosclerosis without flow limiting stenosis in the neck. Electronically Signed   By: Monte Fantasia M.D.   On: 04/18/2020 08:14   MR ANGIO HEAD WO CONTRAST  Result Date: 04/18/2020 CLINICAL DATA:  Pontine infarct EXAM: MRA HEAD WITHOUT CONTRAST TECHNIQUE: Angiographic images of the Circle of Willis were obtained using MRA technique without intravenous contrast. COMPARISON:  Brain MRI 04/18/2020 FINDINGS: Examination is degraded mildly by motion. POSTERIOR CIRCULATION: --Basilar artery: Normal. --Superior cerebellar arteries: Normal. --Posterior cerebral arteries: Severe stenosis of the right PCA proximal P3 segment. Normal left PCA. ANTERIOR CIRCULATION: --Intracranial internal carotid arteries: Normal. --Anterior cerebral arteries (ACA): Normal. Both A1 segments are present. Patent anterior communicating  artery (a-comm). --Middle cerebral arteries (MCA): Normal. IMPRESSION: Motion degraded study without emergent large vessel occlusion. Electronically Signed   By: Ulyses Jarred M.D.   On: 04/18/2020 03:54   MR BRAIN WO CONTRAST  Result Date:  04/18/2020 CLINICAL DATA:  Stroke follow-up EXAM: MRI HEAD WITHOUT CONTRAST TECHNIQUE: Multiplanar, multiecho pulse sequences of the brain and surrounding structures were obtained without intravenous contrast. COMPARISON:  None. FINDINGS: Brain: There is a small acute infarct of the left pons. There is a punctate focus of DWI hyperintensity in the right frontal white matter without a clear correlate on ADC. Early confluent hyperintense T2-weighted signal of the periventricular and deep white matter, most commonly due to chronic ischemic microangiopathy. Old bilateral basal ganglia small vessel infarcts. Multifocal chronic microhemorrhage within the cerebellum and both cerebral hemispheres. There are 5-7 foci total. Normal midline structures. Vascular: Normal flow voids. Skull and upper cervical spine: Normal marrow signal. Sinuses/Orbits: Negative. Other: None. IMPRESSION: 1. Small acute infarct of the left pons. No hemorrhage or mass effect. 2. Punctate focus of DWI hyperintensity in the right frontal white matter without a clear correlate on ADC. This may indicate a small acute or subacute infarct. 3. Moderate chronic ischemic microangiopathy. 4. Multifocal chronic microhemorrhage within the cerebellum and both cerebral hemispheres. Electronically Signed   By: Ulyses Jarred M.D.   On: 04/18/2020 03:21   CT HEAD CODE STROKE WO CONTRAST  Result Date: 04/17/2020 CLINICAL DATA:  Code stroke.  Right-sided facial droop EXAM: CT HEAD WITHOUT CONTRAST TECHNIQUE: Contiguous axial images were obtained from the base of the skull through the vertex without intravenous contrast. COMPARISON:  03/03/2018 FINDINGS: Brain: There is no mass, hemorrhage or extra-axial collection. The size and configuration of the ventricles and extra-axial CSF spaces are normal. There is hypoattenuation of the periventricular white matter, most commonly indicating chronic ischemic microangiopathy. There are small vessel infarcts of the left basal  ganglia that are likely chronic. Vascular: No abnormal hyperdensity of the major intracranial arteries or dural venous sinuses. No intracranial atherosclerosis. Skull: The visualized skull base, calvarium and extracranial soft tissues are normal. Sinuses/Orbits: No fluid levels or advanced mucosal thickening of the visualized paranasal sinuses. No mastoid or middle ear effusion. The orbits are normal. ASPECTS [facility] Stroke Program Early CT Score) - Ganglionic level infarction (caudate, lentiform nuclei, internal capsule, insula, M1-M3 cortex): 7 - Supraganglionic infarction (M4-M6 cortex): 3 Total score (0-10 with 10 being normal): 10 IMPRESSION: 1. No acute intracranial abnormality. 2. ASPECTS is 10. 3. Chronic ischemic microangiopathy and small vessel infarcts of the left basal ganglia that are likely old. 4. These results were communicated to Dr. Amie Portland at 9:58 pm on 04/17/2020 by text page via the [facility] messaging system. Electronically Signed   By: Ulyses Jarred M.D.   On: 04/17/2020 21:58    PHYSICAL EXAM  Temp:  [97.6 F (36.4 C)-98.6 F (37 C)] 98 F (36.7 C) (06/01 1225) Pulse Rate:  [75-90] 90 (06/01 1225) Resp:  [10-23] 18 (06/01 1225) BP: (152-173)/(100-126) 152/113 (06/01 1225) SpO2:  [94 %-100 %] 94 % (06/01 1225) Weight:  [100.3 kg-101.1 kg] 100.3 kg (06/01 0141)  General - Well nourished, well developed, in no apparent distress.  Ophthalmologic - fundi not visualized due to noncooperation.  Cardiovascular - Regular rhythm and rate.  Neuro - awake alert, moderate to severe dysarthria. No aphasia, able to repeat and name and following all simple commands. PERRL, visual field full, right facial droop. Tongue midline.  Left upper and lower extremity  5/5.  Right upper extremity 4/5, right lower extremity 4/5. Sensation symmetrical. Right FTN ataxic but not out of proportion to the weakness. DTR 1+ and no babinski. Gait not tested.    ASSESSMENT/PLAN Mr. Jariah Jarmon  is a 59 y.o. male with history of cocaine abuse, multiple strokes with residual right-sided weakness using walker to walk, depression, diabetes, alcohol abuse, schizophrenia, systolic heart failure, uncontrolled hypertension presenting with R sided weakness and dysphagia.   Stroke:   L paramedian pontine infarct secondary to small vessel disease source. Punctate right frontal juxtacortical WM DWI signal could be T2 shine through vs. Subacute tiny infarct.  Code Stroke CT head No acute abnormality. Small vessel disease. Old L basal ganglia infarcts. ASPECTS 10.     MRI  Small L pontine infarct. Punctate R frontal lobe white matter DWI w/o ADC correlate. Moderate small vessel disease. Multifocal microhemorrhages B cerebellum and cerebral hemispheres.   CTA head & neck advanced intracranial atherosclerosis w/ high-grade narrowing supraclinoid R ICA, L A1 origin, B pericallosal, B PCA branches  2D Echo pending  Recommend 30 day cardiac event monitoring as outpt to rule out afib as outpt given possible right frontal tiny infarct  LDL 114  HgbA1c 7.7  Lovenox 40 mg sq daily for VTE prophylaxis  aspirin 81 mg daily and clopidogrel 75 mg daily prior to admission, now on aspirin 325 mg daily and clopidogrel 75 mg daily. Continue DAPT for 3 months and then plavix alone  Therapy recommendations:  CIR  Disposition:  pending   Hx stroke/TIA  06/2012 - admitted for 3 speech for 3 days.  MRI L pontine small infarct d/t small vessel disease. Aspirin 325.  Mild residual right hemiparesis.  Hypertension  Elevated on arrival 173/105  BP stable at high end . Permissive hypertension (OK if < 220/120) but gradually normalize in 5-7 days . Long-term BP goal normotensive  Hyperlipidemia  Home meds:  No statin  Now on lipitor 40  LDL 114, goal < 70  Continue statin at discharge  Diabetes type II Uncontrolled  HgbA1c 7.7, goal < 7.0  Monitor CBGs  SSI  Close PCP follow-up for better DM  control  Other Stroke Risk Factors  Cigarette smoker, advised to stop smoking  ETOH use, alcohol level <10, advised to drink no more than 2 drink(s) a day  Hx Substance abuse (cocaine) - clean x 3 yrs per dtr - UDS negative.  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 Please call with questions. Pt will follow up with stroke clinic NP at [facility] in about 4 weeks. Thanks for the consult.  Rosalin Hawking, MD PhD Stroke Neurology 04/18/2020 4:47 PM    To contact Stroke Continuity provider, please refer to [email or web address] After hours, contact General Neurology

## 2020-04-18 NOTE — Progress Notes (Signed)
PROGRESS NOTE        PATIENT DETAILS Name: [name] Age: 59 y.o. Sex: male Date of Birth: 1961-04-26 Admit Date: 04/17/2020 Admitting Physician Evalee Mutton Kristeen Mans, MD SKA:JGOT, [place], FNP  Brief Narrative: Patient is a 59 y.o. male with prior history of CVA with chronic residual right-sided weakness, chronic combined systolic/diastolic heart failure, DM-2, HTN, HLD, schizoaffective disorder who presented with worsening right-sided weakness and dysarthria.  He was subsequently admitted for further evaluation and treatment.  Significant events: 5/31>> admit for evaluation of worsening right-sided weakness and dysarthria (more than usual baseline)  Significant studies: 6/1>> CT angio head/neck: Advanced intracranial atherosclerosis with high-grade narrowings at the supraclinoid right ICA, left A1 origin, bilateral pericallosal arteries, bilateral PCA branches, cervical carotid atherosclerosis without flow-limiting stenosis. 6/1>> MRI brain: Small infarct left pons, punctate focus of DWI intensity in the right frontal white matter. 5/31>> CT head: No acute intracranial abnormality 5/31>> chest x-ray: No active disease.  Antimicrobial therapy: None  Microbiology data: None  Procedures : None  Consults: Neurology  DVT Prophylaxis : Prophylactic Lovenox   Subjective: No major complaints-continues to have some right-sided weakness (not sure if he is back to baseline)  Assessment/Plan: Acute CVA: Continues to have some dysarthria and right-sided weakness-not sure if this is his baseline.  Imaging studies as noted above.  A1c 7.7, LDL 114-continue dual antiplatelet agents/statin-await echocardiogram-further input from neurology.  Await eval by rehab services as well.  AKI: Appears to be mild-likely hemodynamically mediated-resolved.  Repeat electrolytes periodically.  Hypertension:allow permissive HTN-given acute CVA.  Continue to hold all  antihypertensive agents.  Chronic combined systolic and diastolic CHF (EF 15-72% by TTE 02/01/2018): Euvolemic on exam-follow closely.   DM-2 (A1c 7.7 on 6/1): CBGs stable on SSI-Metformin on hold.  Further optimization deferred to the outpatient setting  Hyperlipidemia: Continue statin   Schizoaffective disorder: Stable-continue Invega and Cymbalta  History of polysubstance use disorder:Patient denies any recent illicit drug, alcohol, or tobacco use.  UDS and EtOH levels are negative  Obesity: Estimated body mass index is 30.84 kg/m as calculated from the following:   Height as of this encounter: 5\' 11"  (1.803 m).   Weight as of this encounter: 100.3 kg.    Diet: Diet Order            DIET DYS 2 Room service appropriate? Yes with Assist; Fluid consistency: Thin  Diet effective now               Code Status: Full code   Family Communication: None at bedside-we will reach out to family over the next few days once work-up is complete.  Disposition Plan: Status is: Observation>>to inpatient  The patient will require care spanning > 2 midnights and should be moved to inpatient because: Inpatient level of care appropriate due to severity of illness  Dispo: The patient is from: Home              Anticipated d/c is to: TBD              Anticipated d/c date is: 1 day              Patient currently is not medically stable to d/c.   Barriers to Discharge: Acute CVA-work-up in progress-need rehab evaluation to determine appropriate disposition  Antimicrobial agents: Anti-infectives (From admission, onward)   None  Time spent: 25 minutes-Greater than 50% of this time was spent in counseling, explanation of diagnosis, planning of further management, and coordination of care.  MEDICATIONS: Scheduled Meds: . aspirin EC  81 mg Oral Daily  . atorvastatin  40 mg Oral Daily  . clopidogrel  75 mg Oral Daily  . diclofenac Sodium  2 g Topical QID  . DULoxetine  30 mg  Oral BID  . enoxaparin (LOVENOX) injection  40 mg Subcutaneous Q24H  . gabapentin  300 mg Oral TID  . insulin aspart  0-9 Units Subcutaneous TID WC  . paliperidone  3 mg Oral Daily   Continuous Infusions: PRN Meds:.acetaminophen **OR** acetaminophen (TYLENOL) oral liquid 160 mg/5 mL **OR** acetaminophen, labetalol, senna-docusate   PHYSICAL EXAM: Vital signs: Vitals:   04/18/20 0548 04/18/20 0603 04/18/20 0810 04/18/20 1225  BP: (!) 172/126 (!) 156/111 (!) 171/107 (!) 152/113  Pulse: 83 78 76 90  Resp: 14 10 20 18   Temp: 97.6 F (36.4 C)  97.9 F (36.6 C) 98 F (36.7 C)  TempSrc: Other (Comment)  Oral   SpO2: 99% 97% 100% 94%  Weight:      Height:       Filed Weights   04/17/20 2100 04/17/20 2209 04/18/20 0141  Weight: 101.1 kg 101.1 kg 100.3 kg   Body mass index is 30.84 kg/m.   Gen Exam:Alert awake-not in any distress HEENT:atraumatic, normocephalic Chest: B/L clear to auscultation anteriorly CVS:S1S2 regular Abdomen:soft non tender, non distended Extremities:no edema Neurology: Slight dysarthria-continues to have slight right-sided-sided deficits. Skin: no rash  I have personally reviewed following labs and imaging studies  LABORATORY DATA: CBC: Recent Labs  Lab 04/17/20 2146 04/17/20 2150  WBC  --  7.7  NEUTROABS  --  3.9  HGB 12.9* 12.5*  HCT 38.0* 39.1  MCV  --  85.2  PLT  --  993    Basic Metabolic Panel: Recent Labs  Lab 04/17/20 2146 04/17/20 2150 04/18/20 0556  NA 142 139 139  K 3.7 3.7 3.3*  CL 106 106 101  CO2  --  25 25  GLUCOSE 141* 143* 167*  BUN 23* 23* 15  CREATININE 1.40* 1.40* 1.19  CALCIUM  --  9.3 9.1  PHOS  --   --  3.5    GFR: Estimated Creatinine Clearance: 82.6 mL/min (by C-G formula based on SCr of 1.19 mg/dL).  Liver Function Tests: Recent Labs  Lab 04/17/20 2150 04/18/20 0556  AST 21  --   ALT 11  --   ALKPHOS 75  --   BILITOT 0.8  --   PROT 7.5  --   ALBUMIN 4.1 3.7   No results for input(s):  LIPASE, AMYLASE in the last 168 hours. No results for input(s): AMMONIA in the last 168 hours.  Coagulation Profile: Recent Labs  Lab 04/17/20 2150  INR 0.9    Cardiac Enzymes: No results for input(s): CKTOTAL, CKMB, CKMBINDEX, TROPONINI in the last 168 hours.  BNP (last 3 results) No results for input(s): PROBNP in the last 8760 hours.  Lipid Profile: Recent Labs    04/18/20 0556  CHOL 195  HDL 44  LDLCALC 114*  TRIG 183*  CHOLHDL 4.4    Thyroid Function Tests: No results for input(s): TSH, T4TOTAL, FREET4, T3FREE, THYROIDAB in the last 72 hours.  Anemia Panel: No results for input(s): VITAMINB12, FOLATE, FERRITIN, TIBC, IRON, RETICCTPCT in the last 72 hours.  Urine analysis:    Component Value Date/Time   COLORURINE YELLOW 04/17/2020  [place] 04/17/2020 2218   LABSPEC 1.020 04/17/2020 2218   PHURINE 6.0 04/17/2020 [place] 04/17/2020 [place] 04/17/2020 2218   BILIRUBINUR NEGATIVE 04/17/2020 2218   KETONESUR NEGATIVE 04/17/2020 2218   PROTEINUR 100 (A) 04/17/2020 2218   UROBILINOGEN 0.2 08/27/2015 0210   NITRITE NEGATIVE 04/17/2020 2218   LEUKOCYTESUR NEGATIVE 04/17/2020 2218    Sepsis Labs: Lactic Acid, Venous    Component Value Date/Time   LATICACIDVEN 2.9 ([place]) 08/27/2015 0231    MICROBIOLOGY: Recent Results (from the past 240 hour(s))  SARS Coronavirus 2 by RT PCR (hospital order, performed in [facility] hospital lab) Nasopharyngeal Nasopharyngeal Swab     Status: None   Collection Time: 04/18/20 12:25 AM   Specimen: Nasopharyngeal Swab  Result Value Ref Range Status   SARS Coronavirus 2 NEGATIVE NEGATIVE Final    Comment: (NOTE) SARS-CoV-2 target nucleic acids are NOT DETECTED. The SARS-CoV-2 RNA is generally detectable in upper and lower respiratory specimens during the acute phase of infection. The lowest concentration of SARS-CoV-2 viral copies this assay can detect is 250 copies / mL. A  negative result does not preclude SARS-CoV-2 infection and should not be used as the sole basis for treatment or other patient management decisions.  A negative result may occur with improper specimen collection / handling, submission of specimen other than nasopharyngeal swab, presence of viral mutation(s) within the areas targeted by this assay, and inadequate number of viral copies (<250 copies / mL). A negative result must be combined with clinical observations, patient history, and epidemiological information. Fact Sheet for Patients:   StrictlyIdeas.no Fact Sheet for Healthcare Providers: BankingDealers.co.za This test is not yet approved or cleared  by the [place] FDA and has been authorized for detection and/or diagnosis of SARS-CoV-2 by FDA under an Emergency Use Authorization (EUA).  This EUA will remain in effect (meaning this test can be used) for the duration of the COVID-19 declaration under Section 564(b)(1) of the Act, 21 U.S.C. section 360bbb-3(b)(1), unless the authorization is terminated or revoked sooner. Performed at [facility], [place] [address] [place], [place] [address]     RADIOLOGY STUDIES/RESULTS: CT ANGIO HEAD W OR WO CONTRAST  Result Date: 04/18/2020 CLINICAL DATA:  Stroke follow-up EXAM: CT ANGIOGRAPHY HEAD AND NECK TECHNIQUE: Multidetector CT imaging of the head and neck was performed using the standard protocol during bolus administration of intravenous contrast. Multiplanar CT image reconstructions and MIPs were obtained to evaluate the vascular anatomy. Carotid stenosis measurements (when applicable) are obtained utilizing NASCET criteria, using the distal internal carotid diameter as the denominator. CONTRAST:  61mL OMNIPAQUE IOHEXOL 350 MG/ML SOLN COMPARISON:  Brain MRI and MRA from earlier today FINDINGS: CTA NECK FINDINGS Aortic arch: Atheromatous wall thickening.  No acute finding. Right carotid  system: Tortuous with partial retropharyngeal course. Mild atheromatous changes at the bifurcation and distal common carotid. No flow limiting stenosis or ulceration. Left carotid system: Mild atheromatous plaque at the proximal common carotid. No stenosis or ulceration. Vertebral arteries: No proximal subclavian stenosis. The codominant vertebral arteries are smooth and widely patent to the dura. Skeleton: Advanced mid cervical disc degeneration with disc space collapse and endplate sclerosis. Other neck: No acute finding. Upper chest: No acute finding Review of the MIP images confirms the above findings CTA HEAD FINDINGS Anterior circulation: Atheromatous plaque along the bilateral carotid siphons with 45% left periophthalmic ICA narrowing and 75% narrowing at the supraclinoid right ICA. Severe bilateral  pericallosal stenosis with areas of imperceptible luminal enhancement. Severe left A1 origin stenosis. Accounting for atherosclerotic irregularity no aneurysm is seen. Posterior circulation: The vertebral and basilar arteries are smooth and widely patent. Advanced atheromatous type narrowing of bilateral P4 branches. Venous sinuses: Diffusely patent Anatomic variants: None significant Review of the MIP images confirms the above findings IMPRESSION: 1. Advanced intracranial atherosclerosis with high-grade narrowings at the supraclinoid right ICA, left A1 origin, bilateral pericallosal arteries, and bilateral PCA branches. 2. Cervical carotid atherosclerosis without flow limiting stenosis in the neck. Electronically Signed   By: Brown Fantasia M.D.   On: 04/18/2020 08:14   DG Chest 1 View  Result Date: 04/17/2020 CLINICAL DATA:  59 year old male with cough and chest pain. EXAM: CHEST  1 VIEW COMPARISON:  Chest radiograph dated 02/07/2020. FINDINGS: The heart size and mediastinal contours are within normal limits. Both lungs are clear. The visualized skeletal structures are unremarkable. IMPRESSION: No active  disease. Electronically Signed   By: Anner Crete M.D.   On: 04/17/2020 22:36   CT ANGIO NECK W OR WO CONTRAST  Result Date: 04/18/2020 CLINICAL DATA:  Stroke follow-up EXAM: CT ANGIOGRAPHY HEAD AND NECK TECHNIQUE: Multidetector CT imaging of the head and neck was performed using the standard protocol during bolus administration of intravenous contrast. Multiplanar CT image reconstructions and MIPs were obtained to evaluate the vascular anatomy. Carotid stenosis measurements (when applicable) are obtained utilizing NASCET criteria, using the distal internal carotid diameter as the denominator. CONTRAST:  71mL OMNIPAQUE IOHEXOL 350 MG/ML SOLN COMPARISON:  Brain MRI and MRA from earlier today FINDINGS: CTA NECK FINDINGS Aortic arch: Atheromatous wall thickening.  No acute finding. Right carotid system: Tortuous with partial retropharyngeal course. Mild atheromatous changes at the bifurcation and distal common carotid. No flow limiting stenosis or ulceration. Left carotid system: Mild atheromatous plaque at the proximal common carotid. No stenosis or ulceration. Vertebral arteries: No proximal subclavian stenosis. The codominant vertebral arteries are smooth and widely patent to the dura. Skeleton: Advanced mid cervical disc degeneration with disc space collapse and endplate sclerosis. Other neck: No acute finding. Upper chest: No acute finding Review of the MIP images confirms the above findings CTA HEAD FINDINGS Anterior circulation: Atheromatous plaque along the bilateral carotid siphons with 45% left periophthalmic ICA narrowing and 75% narrowing at the supraclinoid right ICA. Severe bilateral pericallosal stenosis with areas of imperceptible luminal enhancement. Severe left A1 origin stenosis. Accounting for atherosclerotic irregularity no aneurysm is seen. Posterior circulation: The vertebral and basilar arteries are smooth and widely patent. Advanced atheromatous type narrowing of bilateral P4 branches.  Venous sinuses: Diffusely patent Anatomic variants: None significant Review of the MIP images confirms the above findings IMPRESSION: 1. Advanced intracranial atherosclerosis with high-grade narrowings at the supraclinoid right ICA, left A1 origin, bilateral pericallosal arteries, and bilateral PCA branches. 2. Cervical carotid atherosclerosis without flow limiting stenosis in the neck. Electronically Signed   By: Brown Fantasia M.D.   On: 04/18/2020 08:14   MR ANGIO HEAD WO CONTRAST  Result Date: 04/18/2020 CLINICAL DATA:  [place] infarct EXAM: MRA HEAD WITHOUT CONTRAST TECHNIQUE: Angiographic images of the Circle of Willis were obtained using MRA technique without intravenous contrast. COMPARISON:  Brain MRI 04/18/2020 FINDINGS: Examination is degraded mildly by motion. POSTERIOR CIRCULATION: --Basilar artery: Normal. --Superior cerebellar arteries: Normal. --Posterior cerebral arteries: Severe stenosis of the right PCA proximal P3 segment. Normal left PCA. ANTERIOR CIRCULATION: --Intracranial internal carotid arteries: Normal. --Anterior cerebral arteries (ACA): Normal. Both A1 segments are present. Patent anterior  communicating artery (a-comm). --Middle cerebral arteries (MCA): Normal. IMPRESSION: Motion degraded study without emergent large vessel occlusion. Electronically Signed   By: Ulyses Jarred M.D.   On: 04/18/2020 03:54   MR BRAIN WO CONTRAST  Result Date: 04/18/2020 CLINICAL DATA:  Stroke follow-up EXAM: MRI HEAD WITHOUT CONTRAST TECHNIQUE: Multiplanar, multiecho pulse sequences of the brain and surrounding structures were obtained without intravenous contrast. COMPARISON:  None. FINDINGS: Brain: There is a small acute infarct of the left pons. There is a punctate focus of DWI hyperintensity in the right frontal white matter without a clear correlate on ADC. Early confluent hyperintense T2-weighted signal of the periventricular and deep white matter, most commonly due to chronic ischemic  microangiopathy. Old bilateral basal ganglia small vessel infarcts. Multifocal chronic microhemorrhage within the cerebellum and both cerebral hemispheres. There are 5-7 foci total. Normal midline structures. Vascular: Normal flow voids. Skull and upper cervical spine: Normal marrow signal. Sinuses/Orbits: Negative. Other: None. IMPRESSION: 1. Small acute infarct of the left pons. No hemorrhage or mass effect. 2. Punctate focus of DWI hyperintensity in the right frontal white matter without a clear correlate on ADC. This may indicate a small acute or subacute infarct. 3. Moderate chronic ischemic microangiopathy. 4. Multifocal chronic microhemorrhage within the cerebellum and both cerebral hemispheres. Electronically Signed   By: Ulyses Jarred M.D.   On: 04/18/2020 03:21   CT HEAD CODE STROKE WO CONTRAST  Result Date: 04/17/2020 CLINICAL DATA:  Code stroke.  Right-sided facial droop EXAM: CT HEAD WITHOUT CONTRAST TECHNIQUE: Contiguous axial images were obtained from the base of the skull through the vertex without intravenous contrast. COMPARISON:  03/03/2018 FINDINGS: Brain: There is no mass, hemorrhage or extra-axial collection. The size and configuration of the ventricles and extra-axial CSF spaces are normal. There is hypoattenuation of the periventricular white matter, most commonly indicating chronic ischemic microangiopathy. There are small vessel infarcts of the left basal ganglia that are likely chronic. Vascular: No abnormal hyperdensity of the major intracranial arteries or dural venous sinuses. No intracranial atherosclerosis. Skull: The visualized skull base, calvarium and extracranial soft tissues are normal. Sinuses/Orbits: No fluid levels or advanced mucosal thickening of the visualized paranasal sinuses. No mastoid or middle ear effusion. The orbits are normal. ASPECTS [facility] Stroke Program Early CT Score) - Ganglionic level infarction (caudate, lentiform nuclei, internal capsule, insula,  M1-M3 cortex): 7 - Supraganglionic infarction (M4-M6 cortex): 3 Total score (0-10 with 10 being normal): 10 IMPRESSION: 1. No acute intracranial abnormality. 2. ASPECTS is 10. 3. Chronic ischemic microangiopathy and small vessel infarcts of the left basal ganglia that are likely old. 4. These results were communicated to Dr. Amie Portland at 9:58 pm on 04/17/2020 by text page via the [facility] messaging system. Electronically Signed   By: Ulyses Jarred M.D.   On: 04/17/2020 21:58     LOS: 0 days   Oren Binet, MD  [facility]    To contact the attending provider between 7A-7P or the covering provider during after hours 7P-7A, please log into the web site [email or web address] and access using universal [place] password for that web site. If you do not have the password, please call the hospital operator.  04/18/2020, 1:09 PM

## 2020-04-18 NOTE — Progress Notes (Signed)
°  Echocardiogram 2D Echocardiogram has been performed.  Randa Lynn Emireth Cockerham 04/18/2020, 4:12 PM

## 2020-04-18 NOTE — Progress Notes (Signed)
°  Speech Language Pathology Treatment: Dysphagia  Patient Details Name: [name] MRN: [id number] DOB: [birth year] Today's Date: 04/18/2020 Time: 7893-8101 SLP Time Calculation (min) (ACUTE ONLY): 16 min  Assessment / Plan / Recommendation Clinical Impression  Went to pt's room to hang swallow sign and he stated he was hungry and didn't eat lunch (?). Repositioned, raised head of be and BP was taking which read 174/123. SLP reclined and second BP reading 162/117. RN called who stated pt's parameters for BP are 220/120. Pt did not feel symptomatic therefore, continued with dysphagia treatment. SLP facilitated safety with providing verbal cueing when needed for smaller bites/rate and to place spoon on left side oral cavity. Consumed straw sips water and pudding without s/s aspiration.  Continue recommendation for MBS tomorrow pons stroke and clinical s/s present during bedside evaluation.    HPI HPI: [name] is a 59 y.o. male with medical history significant for history of stroke with residual right-sided weakness, neurocognitive impairment, chronic combined systolic and diastolic CHF, type 2 diabetes, hypertension, hyperlipidemia, schizoaffective disorder, chronic left hip pain, and history of polysubstance use disorder admitted with with worsening right-sided weakness and dysarthria from his baseline. MRI small acute infarct of the left pons. No hemorrhage or mass effect, multifocal chronic microhemorrhage within the cerebellum and both cerebral hemispheres.      SLP Plan  Continue with current plan of care       Recommendations  Diet recommendations: Dysphagia 2 (fine chop);Thin liquid Liquids provided via: Straw;Cup Medication Administration: Crushed with puree Supervision: Patient able to self feed;Full supervision/cueing for compensatory strategies Compensations: Minimize environmental distractions;Slow rate;Small sips/bites;Lingual sweep for clearance of  pocketing Postural Changes and/or Swallow Maneuvers: Seated upright 90 degrees                Oral Care Recommendations: Oral care BID Follow up Recommendations: 24 hour supervision/assistance SLP Visit Diagnosis: Dysphagia, unspecified (R13.10) Plan: Continue with current plan of care       GO                [name] 04/18/2020, 4:47 PM  [name] M.Ed Risk analyst [phone] Office [phone]

## 2020-04-18 NOTE — Consult Note (Signed)
Physical Medicine and Rehabilitation Consult Reason for Consult: Right side weakness and dysarthria Referring Physician: [facility]   HPI: Macallister Ashmead is a 59 y.o. right-handed male with history of CVA with residual right-sided weakness maintained on aspirin and Plavix, chronic combined systolic diastolic congestive heart failure, type 2 diabetes mellitus, hypertension, hyperlipidemia, schizoaffective disorder as well as history of polysubstance/alcohol/tobacco abuse.  History taken from chart review and patient.  Patient lives with his children.  1 level home 3 steps to entry.  Ambulated modified independent with rolling walker prior to admission.  Family assistance as needed.  He presented on 04/17/2020 with increasing right hemiparesis and dysarthria.  MRI identified small acute infarct of the left pons.  No hemorrhage or mass-effect.  Moderate chronic ischemic microangiopathy.  Patient did not receive TPA.  MRA without emergent large vessel occlusion.  CT angiogram of head and neck advance intracranial atherosclerosis with high-grade narrowing at the supraclinoid right ICA, left A1 origin, bilateral pericallosal arteries and bilateral PCA branches.  Echocardiogram with ejection fraction of 50%.  The left ventricle demonstrated global hypokinesis..  Admission chemistries BUN 23, creatinine 1.40, urine drug screen negative alcohol negative.  Neurology follow-up currently patient remains on aspirin and Plavix therapy for CVA prophylaxis.  Subcutaneous Lovenox for DVT prophylaxis.  Hospital course further complicated by dysphagia, started on dysphagia #2 thin liquid diet.  Therapy evaluations completed with recommendations of physical medicine rehab consult.  Review of Systems  Constitutional: Negative for chills and fever.  HENT: Negative for hearing loss.   Eyes: Negative for blurred vision and double vision.  Respiratory: Negative for cough and shortness of breath.   Cardiovascular:  Negative for chest pain and leg swelling.  Gastrointestinal: Negative for constipation, heartburn, nausea and vomiting.  Genitourinary: Negative for dysuria, flank pain and hematuria.  Musculoskeletal: Positive for myalgias.  Skin: Negative for rash.  Neurological: Positive for speech change, focal weakness and weakness. Negative for sensory change.  Psychiatric/Behavioral: Positive for depression. The patient has insomnia.   All other systems reviewed and are negative.  Past Medical History:  Diagnosis Date  . Alcohol abuse   . Arthritis   . Crack cocaine use   . Depression   . Diabetes mellitus   . Drug abuse ([place])   . DTs (delirium tremens) ([place])    history of   . Gout   . Noncompliance with medication regimen   . Schizophrenia ([place])   . Stroke ([place])   . Systolic heart failure secondary to hypertension ([place])   . Uncontrolled hypertension    Past Surgical History:  Procedure Laterality Date  . LEFT HEART CATH AND CORONARY ANGIOGRAPHY N/A 02/03/2018   Procedure: LEFT HEART CATH AND CORONARY ANGIOGRAPHY;  Surgeon: Jettie Booze, MD;  Location: [facility];  Service: Cardiovascular;  Laterality: N/A;  . MANDIBLE RECONSTRUCTION    . TONSILLECTOMY     Family History  Problem Relation Age of Onset  . Hypertension Other    Social History:  reports that he has been smoking cigarettes. He has been smoking about 0.25 packs per day. He has never used smokeless tobacco. He reports current alcohol use of about 1.0 - 2.0 standard drinks of alcohol per week. He reports current drug use. Drugs: "Crack" cocaine and Cocaine. Allergies: No Known Allergies Medications Prior to Admission  Medication Sig Dispense Refill  . acetaminophen (TYLENOL) 325 MG tablet Take 2 tablets (650 mg total) by mouth every 6 (six) hours as needed for mild  pain (or Fever >/= 101). 30 tablet 0  . amLODipine (NORVASC) 10 MG tablet Take 1 tablet (10 mg total) by mouth daily. 30 tablet 0  . aspirin EC 81  MG tablet Take 81 mg by mouth daily.    . carvedilol (COREG) 25 MG tablet Take 1 tablet (25 mg total) by mouth 2 (two) times daily with a meal. 60 tablet 0  . diclofenac Sodium (VOLTAREN) 1 % GEL Apply 2 g topically 4 (four) times daily. 50 g 0  . ergocalciferol (VITAMIN D2) 1.25 MG (50000 UT) capsule Take 1 capsule by mouth once a week.    Marland Kitchen FLUoxetine (PROZAC) 10 MG capsule Take 10 mg by mouth daily.    . folic acid (FOLVITE) 1 MG tablet Take 1 mg by mouth daily.    Marland Kitchen gabapentin (NEURONTIN) 300 MG capsule Take 1 capsule (300 mg total) by mouth 3 (three) times daily. 90 capsule 0  . hydrALAZINE (APRESOLINE) 25 MG tablet Take 1 tablet (25 mg total) by mouth 3 (three) times daily. 90 tablet 0  . hydrOXYzine (ATARAX/VISTARIL) 25 MG tablet Take 25 mg by mouth 2 (two) times daily.    Marland Kitchen losartan (COZAAR) 50 MG tablet Take 1 tablet (50 mg total) by mouth daily. 30 tablet 0  . metFORMIN (GLUCOPHAGE) 500 MG tablet Take 1 tablet (500 mg total) by mouth 2 (two) times daily with a meal. 60 tablet 0  . mirtazapine (REMERON) 7.5 MG tablet Take 7.5 mg by mouth at bedtime.    . Multiple Vitamin (DAILY-VITE) TABS Take 1 tablet by mouth daily.    . paliperidone (INVEGA) 3 MG 24 hr tablet Take 1 tablet (3 mg total) by mouth daily. (Patient taking differently: Take 6 mg by mouth daily. ) 30 tablet 0  . pantoprazole (PROTONIX) 20 MG tablet Take 20 mg by mouth daily.    . SENNA PLUS 8.6-50 MG tablet Take 1 tablet by mouth daily.    . traMADol (ULTRAM) 50 MG tablet Take 50 mg by mouth every 6 (six) hours as needed for moderate pain.     Marland Kitchen clopidogrel (PLAVIX) 75 MG tablet Take 1 tablet (75 mg total) by mouth daily. (Patient not taking: Reported on 04/18/2020) 30 tablet 0  . DULoxetine (CYMBALTA) 30 MG capsule Take 1 capsule (30 mg total) by mouth 2 (two) times daily. (Patient not taking: Reported on 04/18/2020) 60 capsule 0  . ondansetron (ZOFRAN) 4 MG tablet Take 1 tablet (4 mg total) by mouth every 6 (six) hours as  needed for nausea. (Patient not taking: Reported on 04/18/2020) 20 tablet 0    Home: [place] expects to be discharged to:: Private residence Living Arrangements: Children Available Help at Discharge: Family, Available 24 hours/day Type of Home: House Home Access: Stairs to enter [employer] of Steps: 3 Entrance Stairs-Rails: None Home Layout: One level Bathroom Shower/Tub: Chiropodist: Standard Home Equipment: Environmental consultant - 2 wheels  Functional History: Prior Function Level of Independence: Needs assistance Gait / Transfers Assistance Needed: amb modified independent with rolling walker Comments: Uses RW Functional Status:  Mobility: Bed Mobility Overal bed mobility: Needs Assistance Bed Mobility: Sit to Supine Sidelying to sit: Mod assist Sit to supine: Mod assist General bed mobility comments: Assist bringing legs up  Transfers Overall transfer level: Needs assistance Equipment used: None Transfer via Lift Equipment: Stedy Transfers: Sit to/from Stand, Risk manager Sit to Stand: Max assist Stand pivot transfers: Max assist General transfer comment: face to face  stand and transfer.  Patients legs buckling slightly and patient needed assist with moving R leg back one step for pivot. Ambulation/Gait General Gait Details: unable to attempt    ADL: ADL Overall ADL's : Needs assistance/impaired Eating/Feeding: Sitting, Minimal assistance Grooming: Minimal assistance, Sitting, Oral care Upper Body Bathing: Minimal assistance, Sitting Lower Body Bathing: Moderate assistance, Sitting/lateral leans Upper Body Dressing : Minimal assistance, Sitting Lower Body Dressing: Moderate assistance, Sitting/lateral leans Toilet Transfer: Maximal assistance, Stand-pivot Functional mobility during ADLs: Maximal assistance  Cognition: Cognition Overall Cognitive Status: No family/caregiver present to determine baseline cognitive  functioning Orientation Level: Oriented X4 Cognition Arousal/Alertness: Awake/alert Behavior During Therapy: WFL for tasks assessed/performed Overall Cognitive Status: No family/caregiver present to determine baseline cognitive functioning General Comments: Followed commands, becoms a little frustrated when people do not understand what he is trying to say.  Not oriented to year/day but knows month.  Blood pressure (!) 151/116, pulse 98, temperature 98.3 F (36.8 C), resp. rate 18, height 5\' 11"  (1.803 m), weight 100.3 kg, SpO2 100 %. Physical Exam  Vitals reviewed. Constitutional: He appears well-developed and well-nourished.  HENT:  Head: Normocephalic and atraumatic.  Eyes: EOM are normal. Right eye exhibits no discharge. Left eye exhibits no discharge.  Neck: No tracheal deviation present. No thyromegaly present.  Respiratory: Effort normal. No stridor. No respiratory distress.  GI:  Slightly firm Distended  Musculoskeletal:     Comments: No edema or tenderness in extremities  Neurological: He is alert.  Patient is alert in no acute distress.   Dysarthria Provides his name and age.   Follows simple commands. Motor: RUE: 4-4+/5 proximal and distal (baseline) RLE: 4/5 proximal distal (baseline) LUE/LLE: 5/5 proximal distal  Skin: Skin is warm and dry.  Psychiatric: He has a normal mood and affect. His behavior is normal. Thought content normal.    Results for orders placed or performed during the hospital encounter of 04/17/20 (from the past 24 hour(s))  Glucose, capillary     Status: Abnormal   Collection Time: 04/18/20  4:02 PM  Result Value Ref Range   Glucose-Capillary 128 (H) 70 - 99 mg/dL  Glucose, capillary     Status: Abnormal   Collection Time: 04/18/20  9:17 PM  Result Value Ref Range   Glucose-Capillary 178 (H) 70 - 99 mg/dL  Glucose, capillary     Status: Abnormal   Collection Time: 04/19/20  7:49 AM  Result Value Ref Range   Glucose-Capillary 185 (H) 70 -  99 mg/dL  Glucose, capillary     Status: Abnormal   Collection Time: 04/19/20 11:51 AM  Result Value Ref Range   Glucose-Capillary 152 (H) 70 - 99 mg/dL   CT ANGIO HEAD W OR WO CONTRAST  Result Date: 04/18/2020 CLINICAL DATA:  Stroke follow-up EXAM: CT ANGIOGRAPHY HEAD AND NECK TECHNIQUE: Multidetector CT imaging of the head and neck was performed using the standard protocol during bolus administration of intravenous contrast. Multiplanar CT image reconstructions and MIPs were obtained to evaluate the vascular anatomy. Carotid stenosis measurements (when applicable) are obtained utilizing NASCET criteria, using the distal internal carotid diameter as the denominator. CONTRAST:  93mL OMNIPAQUE IOHEXOL 350 MG/ML SOLN COMPARISON:  Brain MRI and MRA from earlier today FINDINGS: CTA NECK FINDINGS Aortic arch: Atheromatous wall thickening.  No acute finding. Right carotid system: Tortuous with partial retropharyngeal course. Mild atheromatous changes at the bifurcation and distal common carotid. No flow limiting stenosis or ulceration. Left carotid system: Mild atheromatous plaque at the proximal common carotid.  No stenosis or ulceration. Vertebral arteries: No proximal subclavian stenosis. The codominant vertebral arteries are smooth and widely patent to the dura. Skeleton: Advanced mid cervical disc degeneration with disc space collapse and endplate sclerosis. Other neck: No acute finding. Upper chest: No acute finding Review of the MIP images confirms the above findings CTA HEAD FINDINGS Anterior circulation: Atheromatous plaque along the bilateral carotid siphons with 45% left periophthalmic ICA narrowing and 75% narrowing at the supraclinoid right ICA. Severe bilateral pericallosal stenosis with areas of imperceptible luminal enhancement. Severe left A1 origin stenosis. Accounting for atherosclerotic irregularity no aneurysm is seen. Posterior circulation: The vertebral and basilar arteries are smooth and  widely patent. Advanced atheromatous type narrowing of bilateral P4 branches. Venous sinuses: Diffusely patent Anatomic variants: None significant Review of the MIP images confirms the above findings IMPRESSION: 1. Advanced intracranial atherosclerosis with high-grade narrowings at the supraclinoid right ICA, left A1 origin, bilateral pericallosal arteries, and bilateral PCA branches. 2. Cervical carotid atherosclerosis without flow limiting stenosis in the neck. Electronically Signed   By: Monte Fantasia M.D.   On: 04/18/2020 08:14   DG Chest 1 View  Result Date: 04/17/2020 CLINICAL DATA:  59 year old male with cough and chest pain. EXAM: CHEST  1 VIEW COMPARISON:  Chest radiograph dated 02/07/2020. FINDINGS: The heart size and mediastinal contours are within normal limits. Both lungs are clear. The visualized skeletal structures are unremarkable. IMPRESSION: No active disease. Electronically Signed   By: Anner Crete M.D.   On: 04/17/2020 22:36   CT ANGIO NECK W OR WO CONTRAST  Result Date: 04/18/2020 CLINICAL DATA:  Stroke follow-up EXAM: CT ANGIOGRAPHY HEAD AND NECK TECHNIQUE: Multidetector CT imaging of the head and neck was performed using the standard protocol during bolus administration of intravenous contrast. Multiplanar CT image reconstructions and MIPs were obtained to evaluate the vascular anatomy. Carotid stenosis measurements (when applicable) are obtained utilizing NASCET criteria, using the distal internal carotid diameter as the denominator. CONTRAST:  33mL OMNIPAQUE IOHEXOL 350 MG/ML SOLN COMPARISON:  Brain MRI and MRA from earlier today FINDINGS: CTA NECK FINDINGS Aortic arch: Atheromatous wall thickening.  No acute finding. Right carotid system: Tortuous with partial retropharyngeal course. Mild atheromatous changes at the bifurcation and distal common carotid. No flow limiting stenosis or ulceration. Left carotid system: Mild atheromatous plaque at the proximal common carotid. No  stenosis or ulceration. Vertebral arteries: No proximal subclavian stenosis. The codominant vertebral arteries are smooth and widely patent to the dura. Skeleton: Advanced mid cervical disc degeneration with disc space collapse and endplate sclerosis. Other neck: No acute finding. Upper chest: No acute finding Review of the MIP images confirms the above findings CTA HEAD FINDINGS Anterior circulation: Atheromatous plaque along the bilateral carotid siphons with 45% left periophthalmic ICA narrowing and 75% narrowing at the supraclinoid right ICA. Severe bilateral pericallosal stenosis with areas of imperceptible luminal enhancement. Severe left A1 origin stenosis. Accounting for atherosclerotic irregularity no aneurysm is seen. Posterior circulation: The vertebral and basilar arteries are smooth and widely patent. Advanced atheromatous type narrowing of bilateral P4 branches. Venous sinuses: Diffusely patent Anatomic variants: None significant Review of the MIP images confirms the above findings IMPRESSION: 1. Advanced intracranial atherosclerosis with high-grade narrowings at the supraclinoid right ICA, left A1 origin, bilateral pericallosal arteries, and bilateral PCA branches. 2. Cervical carotid atherosclerosis without flow limiting stenosis in the neck. Electronically Signed   By: Monte Fantasia M.D.   On: 04/18/2020 08:14   MR ANGIO HEAD WO CONTRAST  Result Date:  04/18/2020 CLINICAL DATA:  Pontine infarct EXAM: MRA HEAD WITHOUT CONTRAST TECHNIQUE: Angiographic images of the Circle of Willis were obtained using MRA technique without intravenous contrast. COMPARISON:  Brain MRI 04/18/2020 FINDINGS: Examination is degraded mildly by motion. POSTERIOR CIRCULATION: --Basilar artery: Normal. --Superior cerebellar arteries: Normal. --Posterior cerebral arteries: Severe stenosis of the right PCA proximal P3 segment. Normal left PCA. ANTERIOR CIRCULATION: --Intracranial internal carotid arteries: Normal. --Anterior  cerebral arteries (ACA): Normal. Both A1 segments are present. Patent anterior communicating artery (a-comm). --Middle cerebral arteries (MCA): Normal. IMPRESSION: Motion degraded study without emergent large vessel occlusion. Electronically Signed   By: Ulyses Jarred M.D.   On: 04/18/2020 03:54   MR BRAIN WO CONTRAST  Result Date: 04/18/2020 CLINICAL DATA:  Stroke follow-up EXAM: MRI HEAD WITHOUT CONTRAST TECHNIQUE: Multiplanar, multiecho pulse sequences of the brain and surrounding structures were obtained without intravenous contrast. COMPARISON:  None. FINDINGS: Brain: There is a small acute infarct of the left pons. There is a punctate focus of DWI hyperintensity in the right frontal white matter without a clear correlate on ADC. Early confluent hyperintense T2-weighted signal of the periventricular and deep white matter, most commonly due to chronic ischemic microangiopathy. Old bilateral basal ganglia small vessel infarcts. Multifocal chronic microhemorrhage within the cerebellum and both cerebral hemispheres. There are 5-7 foci total. Normal midline structures. Vascular: Normal flow voids. Skull and upper cervical spine: Normal marrow signal. Sinuses/Orbits: Negative. Other: None. IMPRESSION: 1. Small acute infarct of the left pons. No hemorrhage or mass effect. 2. Punctate focus of DWI hyperintensity in the right frontal white matter without a clear correlate on ADC. This may indicate a small acute or subacute infarct. 3. Moderate chronic ischemic microangiopathy. 4. Multifocal chronic microhemorrhage within the cerebellum and both cerebral hemispheres. Electronically Signed   By: Ulyses Jarred M.D.   On: 04/18/2020 03:21   ECHOCARDIOGRAM COMPLETE  Result Date: 04/18/2020    ECHOCARDIOGRAM REPORT   Patient Name:   WRANGLER PENNING Date of Exam: 04/18/2020 Medical Rec #:  [id number]            Height:       71.0 in Accession #:    [id number]           Weight:       221.1 lb Date of Birth:  [birth year]            BSA:          2.200 m Patient Age:    61 years             BP:           152/113 mmHg Patient Gender: M                    HR:           81 bpm. Exam Location:  Inpatient Procedure: 2D Echo, Cardiac Doppler and Color Doppler Indications:    Stroke 434.91 / I163.9  History:        Patient has prior history of Echocardiogram examinations, most                 recent 02/01/2018. CHF; Risk Factors:Hypertension and Diabetes.                 Alcohol abuse. Cocaine abuse.  Sonographer:    Jonelle Sidle Dance Referring Phys: [id number] [place]  1. Left ventricular ejection fraction, by estimation, is 45 to 50%. The left ventricle has mildly decreased function. The  left ventricle demonstrates global hypokinesis. Left ventricular diastolic parameters are indeterminate.  2. Right ventricular systolic function is normal. The right ventricular size is normal.  3. The mitral valve is normal in structure. Trivial mitral valve regurgitation. No evidence of mitral stenosis.  4. Unable to determine valve morphology due to image quality. Aortic valve regurgitation is not visualized. No aortic stenosis is present.  5. Aortic dilatation noted. 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  6. The inferior vena cava is dilated in size with >50% respiratory variability, suggesting right atrial pressure of 8 mmHg. FINDINGS  Left Ventricle: Left ventricular ejection fraction, by estimation, is 45 to 50%. The left ventricle has mildly decreased function. The left ventricle demonstrates global hypokinesis. The left ventricular internal cavity size was normal in size. There is  borderline left ventricular hypertrophy. Left ventricular diastolic parameters are indeterminate. Right Ventricle: The right ventricular size is normal. No increase in right ventricular wall thickness. Right ventricular systolic function is normal. Left Atrium: Left atrial size was normal in size. Right Atrium: Right atrial size was  normal in size. Pericardium: There is no evidence of pericardial effusion. Mitral Valve: The mitral valve is normal in structure. Trivial mitral valve regurgitation. No evidence of mitral valve stenosis. Tricuspid Valve: The tricuspid valve is normal in structure. Tricuspid valve regurgitation is trivial. Aortic Valve: Unable to determine valve morphology due to image quality. Aortic valve regurgitation is not visualized. No aortic stenosis is present. Pulmonic Valve: The pulmonic valve was grossly normal. Pulmonic valve regurgitation is not visualized. No evidence of pulmonic stenosis. Aorta: Aortic dilatation noted. 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 Venous: The inferior vena cava is dilated in size with greater than 50% respiratory variability, suggesting right atrial pressure of 8 mmHg. IAS/Shunts: No atrial level shunt detected by color flow Doppler.  LEFT VENTRICLE PLAX 2D LVIDd:         5.40 cm  Diastology LVIDs:         4.10 cm  LV e' lateral:   8.38 cm/s LV PW:         1.00 cm  LV E/e' lateral: 10.8 LV IVS:        1.30 cm  LV e' medial:    5.22 cm/s LVOT diam:     2.30 cm  LV E/e' medial:  17.3 LV SV:         74 LV SV Index:   33 LVOT Area:     4.15 cm  RIGHT VENTRICLE             IVC RV Basal diam:  2.30 cm     IVC diam: 2.20 cm RV S prime:     14.50 cm/s TAPSE (M-mode): 1.8 cm LEFT ATRIUM             Index LA diam:        3.60 cm 1.64 cm/m LA Vol (A2C):   63.9 ml 29.04 ml/m LA Vol (A4C):   40.7 ml 18.50 ml/m LA Biplane Vol: 52.8 ml 23.99 ml/m  AORTIC VALVE LVOT Vmax:   90.20 cm/s LVOT Vmean:  59.700 cm/s LVOT VTI:    0.177 m  AORTA Ao Root diam: 4.10 cm Ao Asc diam:  4.00 cm MITRAL VALVE MV Area (PHT): 5.01 cm    SHUNTS MV Decel Time: 152 msec    Systemic VTI:  0.18 m MV E velocity: 90.50 cm/s  Systemic Diam: 2.30 cm [employer]  MD Electronically signed by Buford Dresser MD Signature Date/Time: 04/18/2020/10:21:13 PM    Final    CT HEAD CODE STROKE WO  CONTRAST  Result Date: 04/17/2020 CLINICAL DATA:  Code stroke.  Right-sided facial droop EXAM: CT HEAD WITHOUT CONTRAST TECHNIQUE: Contiguous axial images were obtained from the base of the skull through the vertex without intravenous contrast. COMPARISON:  03/03/2018 FINDINGS: Brain: There is no mass, hemorrhage or extra-axial collection. The size and configuration of the ventricles and extra-axial CSF spaces are normal. There is hypoattenuation of the periventricular white matter, most commonly indicating chronic ischemic microangiopathy. There are small vessel infarcts of the left basal ganglia that are likely chronic. Vascular: No abnormal hyperdensity of the major intracranial arteries or dural venous sinuses. No intracranial atherosclerosis. Skull: The visualized skull base, calvarium and extracranial soft tissues are normal. Sinuses/Orbits: No fluid levels or advanced mucosal thickening of the visualized paranasal sinuses. No mastoid or middle ear effusion. The orbits are normal. ASPECTS [facility] Stroke Program Early CT Score) - Ganglionic level infarction (caudate, lentiform nuclei, internal capsule, insula, M1-M3 cortex): 7 - Supraganglionic infarction (M4-M6 cortex): 3 Total score (0-10 with 10 being normal): 10 IMPRESSION: 1. No acute intracranial abnormality. 2. ASPECTS is 10. 3. Chronic ischemic microangiopathy and small vessel infarcts of the left basal ganglia that are likely old. 4. These results were communicated to Dr. Amie Portland at 9:58 pm on 04/17/2020 by text page via the [facility] messaging system. Electronically Signed   By: Ulyses Jarred M.D.   On: 04/17/2020 21:58    Assessment/Plan: Diagnosis: Left pons infarct Stroke: Continue secondary stroke prophylaxis and Risk Factor Modification listed below:   Antiplatelet therapy:   Blood Pressure Management:  Continue current medication with prn's with permisive HTN per primary team Statin Agent:   Diabetes management:   Tobacco abuse:     Right sided hemiparesis:  PT/OT for mobility, ADL training  Labs independently reviewed.  Records reviewed and summated above.  1. Does the need for close, 24 hr/day medical supervision in concert with the patient's rehab needs make it unreasonable for this patient to be served in a less intensive setting? Yes  2. Co-Morbidities requiring supervision/potential complications: CVA with residual right-sided weakness, chronic combined systolic diastolic congestive heart failure (Daily weights, monitor for signs and symptoms of fluid overload), type 2 DM (Monitor in accordance with exercise and adjust meds as necessary), HTN (monitor and provide prns in accordance with increased physical exertion and pain), hyperlipidemia, schizoaffective disorder, polysubstance/alcohol/tobacco abuse (counsel), hypokalemia (continue to monitor and replete as necessary) 3. Due to safety, disease management and patient education, does the patient require 24 hr/day rehab nursing? Yes 4. Does the patient require coordinated care of a physician, rehab nurse, therapy disciplines of PT/OT/SLP to address physical and functional deficits in the context of the above medical diagnosis(es)? Yes Addressing deficits in the following areas: balance, endurance, locomotion, strength, transferring, bathing, dressing, toileting, speech, swallowing and psychosocial support 5. Can the patient actively participate in an intensive therapy program of at least 3 hrs of therapy per day at least 5 days per week? Yes 6. The potential for patient to make measurable gains while on inpatient rehab is excellent 7. Anticipated functional outcomes upon discharge from inpatient rehab are modified independent and supervision  with PT, modified independent and supervision with OT, modified independent with SLP. 8. Estimated rehab length of stay to reach the above functional goals is: 12-15 days. 9. Anticipated discharge destination: Home 10. Overall  Rehab/Functional Prognosis: good  RECOMMENDATIONS: This patient's condition is appropriate for continued rehabilitative care in the following setting: CIR patient does not advance to supervision level of functioning. Patient has agreed to participate in recommended program. Yes Note that insurance prior authorization may be required for reimbursement for recommended care.  Comment: Rehab Admissions Coordinator to follow up.  I have personally performed a face to face diagnostic evaluation, including, but not limited to relevant history and physical exam findings, of this patient and developed relevant assessment and plan.  Additionally, I have reviewed and concur with the physician assistant's documentation above.   Delice Lesch, MD, ABPMR Lavon Paganini Angiulli, PA-C 04/19/2020

## 2020-04-18 NOTE — Progress Notes (Signed)
Occupational Therapy Evaluation Patient Details Name: [name] MRN: [id number] DOB: 14-Jun-1961 Today's Date: 04/18/2020    History of Present Illness Pt adm with worsening rt sided weakness and dysarthria from his baseline. Pt with prior CVA with residual rt sided weakness and neurocognitive impairment. MRI showed small acute infarct of left pons.  PMH -  Lt CVA, chf, DM, HTN, schizoaffective disorder, chronic lt hip pain, polysubstance abuse, gout.    Clinical Impression   Patient lives at home with family and is typically independent with devices.  Today patient presenting with R UE weakness, slurred speech, decreased safety awareness and reduced independence.  Patient R UE grossly 3/5 strength and decreased coordination.  Patient able to complete grooming tasks with min assist and open/close packages/containers but required increased time.  Had difficulty holding cup with R hand.  Patient has slight R visual field cut, though unclear if this is new.  Patient states there is no visual change, so either cut is from previous stroke or patient is not aware of deficit.  Patient mod assist with LB seated ADLs.  Able to stand pivot with therapists face to face though x2 assist good for safety.  Recommending CIR as patient has good potential for improvement with increased therapy and is motivated.  Will continue to follow with OT acutely to address the deficits listed below.       Follow Up Recommendations  CIR;Supervision/Assistance - 24 hour    Equipment Recommendations  Other (comment)(defer to next venue)    Recommendations for Other Services Rehab consult     Precautions / Restrictions Precautions Precautions: Fall Restrictions Weight Bearing Restrictions: No      Mobility Bed Mobility Overal bed mobility: Needs Assistance Bed Mobility: Sit to Supine   Sidelying to sit: Mod assist   Sit to supine: Mod assist   General bed mobility comments: Assist bringing legs up    Transfers Overall transfer level: Needs assistance Equipment used: None Transfers: Sit to/from [employer] Transfers Sit to Stand: Max assist Stand pivot transfers: Max assist       General transfer comment: face to face stand and transfer.  Patients legs buckling slightly and patient needed assist with moving R leg back one step for pivot.    Balance Overall balance assessment: Needs assistance Sitting-balance support: No upper extremity supported;Feet supported Sitting balance-Leahy Scale: Poor Sitting balance - Comments: Patient can hold unsupported upright position briefly but starts to lean back/right.  Patientused L foot to hook under bed and help hold self up while using both hands for grooming - clever problem solving  Postural control: Posterior lean;Right lateral lean   Standing balance-Leahy Scale: Poor                            ADL either performed or assessed with clinical judgement   ADL Overall ADL's : Needs assistance/impaired Eating/Feeding: Sitting;Minimal assistance   Grooming: Minimal assistance;Sitting;Oral care   Upper Body Bathing: Minimal assistance;Sitting   Lower Body Bathing: Moderate assistance;Sitting/lateral leans   Upper Body Dressing : Minimal assistance;Sitting   Lower Body Dressing: Moderate assistance;Sitting/lateral leans   Toilet Transfer: Maximal assistance;Stand-pivot           Functional mobility during ADLs: Maximal assistance       Vision   Vision Assessment?: Vision impaired- to be further tested in functional context Additional Comments: Slight R visual field cut patient seems unaware of this deficit     Perception  Praxis      Pertinent Vitals/Pain Pain Assessment: Faces Faces Pain Scale: Hurts little more Pain Location: L leg shooting pain, says this is normal for him Pain Descriptors / Indicators: Discomfort;Grimacing;Shooting Pain Intervention(s): Limited activity within patient's  tolerance;Repositioned;Monitored during session     Hand Dominance Left   Extremity/Trunk Assessment Upper Extremity Assessment Upper Extremity Assessment: Generalized weakness;RUE deficits/detail RUE Deficits / Details: gross strength 3/5, able to move through full ROM.  Decreased coordination. RUE Sensation: WNL RUE Coordination: decreased fine motor;decreased gross motor   Lower Extremity Assessment Lower Extremity Assessment: Defer to PT evaluation RLE Deficits / Details: grossly 3+/5 strength       Communication Communication Communication: Expressive difficulties   Cognition Arousal/Alertness: Awake/alert Behavior During Therapy: WFL for tasks assessed/performed Overall Cognitive Status: No family/caregiver present to determine baseline cognitive functioning                                 General Comments: Followed commands, becoms a little frustrated when people do not understand what he is trying to say.  Not oriented to year/day but knows month.   General Comments  Room air, vitals WNL    Exercises     Shoulder Instructions      Home Living Family/patient expects to be discharged to:: Private residence Living Arrangements: Children Available Help at Discharge: Family;Available 24 hours/day Type of Home: House Home Access: Stairs to enter [employer] of Steps: 3 Entrance Stairs-Rails: None Home Layout: One level     Bathroom Shower/Tub: Teacher, early years/pre: Standard     Home Equipment: Environmental consultant - 2 wheels          Prior Functioning/Environment Level of Independence: Needs assistance  Gait / Transfers Assistance Needed: amb modified independent with rolling walker     Comments: Uses RW        OT Problem List: Decreased strength;Decreased activity tolerance;Impaired balance (sitting and/or standing);Decreased coordination;Decreased cognition;Decreased safety awareness;Impaired UE functional use;Pain       OT Treatment/Interventions: Self-care/ADL training;Therapeutic exercise;Neuromuscular education;Energy conservation;DME and/or AE instruction;Therapeutic activities;Cognitive remediation/compensation;Patient/family education;Visual/perceptual remediation/compensation;Balance training    OT Goals(Current goals can be found in the care plan section) Acute Rehab OT Goals Patient Stated Goal: go home OT Goal Formulation: With patient Time For Goal Achievement: 05/02/20 Potential to Achieve Goals: Good  OT Frequency: Min 2X/week   Barriers to D/C:            Co-evaluation              AM-PAC OT "6 Clicks" Daily Activity     Outcome Measure Help from another person eating meals?: A Little Help from another person taking care of personal grooming?: A Little Help from another person toileting, which includes using toliet, bedpan, or urinal?: A Lot Help from another person bathing (including washing, rinsing, drying)?: A Lot Help from another person to put on and taking off regular upper body clothing?: A Little Help from another person to put on and taking off regular lower body clothing?: A Lot 6 Click Score: 15   End of Session Equipment Utilized During Treatment: Gait belt Nurse Communication: Mobility status  Activity Tolerance: Patient tolerated treatment well Patient left: in bed;with call bell/phone within reach;with bed alarm set  OT Visit Diagnosis: Unsteadiness on feet (R26.81);Muscle weakness (generalized) (M62.81);Other symptoms and signs involving the nervous system (R29.898);Hemiplegia and hemiparesis Hemiplegia - Right/Left: Right Hemiplegia - dominant/non-dominant: Non-Dominant  Time: 8185-6314 OT Time Calculation (min): 30 min Charges:  OT General Charges $OT Visit: 1 Visit OT Evaluation $OT Eval Moderate Complexity: 1 Mod OT Treatments $Self Care/Home Management : 8-22 mins  August Luz, OTR/L   Phylliss Bob 04/18/2020, 2:45  PM

## 2020-04-18 NOTE — Progress Notes (Signed)
Patient received to the unit. Patient is alert and oriented x3. Vital signs are stable. Iv in place and running fluid. Skin assessment done with another nurse. No skin break down noted during assessment. Patient given instructions about call bell and phone. Bed in low position and call bell in reach.

## 2020-04-18 NOTE — Progress Notes (Signed)
Pt unable to stop snoring. Unable to obtain MRA neck at this time due to movement. Can try again later when pt is able to stay awake.

## 2020-04-18 NOTE — Evaluation (Signed)
Clinical/Bedside Swallow Evaluation Patient Details  Name: [name] MRN: [id number] Date of Birth: 05-13-1961  Today's Date: 04/18/2020 Time: SLP Start Time (ACUTE ONLY): 1109 SLP Stop Time (ACUTE ONLY): 1126 SLP Time Calculation (min) (ACUTE ONLY): 17 min  Past Medical History:  Past Medical History:  Diagnosis Date  . Alcohol abuse   . Arthritis   . Crack cocaine use   . Depression   . Diabetes mellitus   . Drug abuse ([place])   . DTs (delirium tremens) ([place])    history of   . Gout   . Noncompliance with medication regimen   . Schizophrenia ([place])   . Stroke ([place])   . Systolic heart failure secondary to hypertension ([place])   . Uncontrolled hypertension    Past Surgical History:  Past Surgical History:  Procedure Laterality Date  . LEFT HEART CATH AND CORONARY ANGIOGRAPHY N/A 02/03/2018   Procedure: LEFT HEART CATH AND CORONARY ANGIOGRAPHY;  Surgeon: Jettie Booze, MD;  Location: [facility];  Service: Cardiovascular;  Laterality: N/A;  . MANDIBLE RECONSTRUCTION    . TONSILLECTOMY     HPI:  Maximum [name] is a 59 y.o. male with medical history significant for history of stroke with residual right-sided weakness, neurocognitive impairment, chronic combined systolic and diastolic CHF, type 2 diabetes, hypertension, hyperlipidemia, schizoaffective disorder, chronic left hip pain, and history of polysubstance use disorder admitted with with worsening right-sided weakness and dysarthria from his baseline. MRI small acute infarct of the left pons. No hemorrhage or mass effect, multifocal chronic microhemorrhage within the cerebellum and both cerebral hemispheres.   Assessment / Plan / Recommendation Clinical Impression  Upon arrival pt consuming breakfast and experiencing a series of sneezes with pocketed food spilling from right side of oral cavity. When cued he removed large amount of eggs and sausage from mouth. He exhibits right sided facial droop  (uncertain of baseline oral deficits) and asensate to labial residue. In addition, speech is significantly dysarthric. Consistent throat clearing, immediately after po's and with delays. Educated pt and reviewed various diet textures. He declined puree at this time however may become necessary and texture downgraded to Dys 2. Clinically an intrumental assessment warranted in addition to involvement of pons with acute stroke. Educated him to check for pocketing on right and remove. MBS could not be accommodated today and will be scheduled for tomorrow.      SLP Visit Diagnosis: Dysphagia, unspecified (R13.10)    Aspiration Risk  Moderate aspiration risk    Diet Recommendation Dysphagia 2 (Fine chop);Thin liquid   Liquid Administration via: Cup Medication Administration: Crushed with puree Supervision: Patient able to self feed;Full supervision/cueing for compensatory strategies Compensations: Minimize environmental distractions;Slow rate;Small sips/bites;Lingual sweep for clearance of pocketing Postural Changes: Seated upright at 90 degrees    Other  Recommendations Oral Care Recommendations: Oral care BID   Follow up Recommendations 24 hour supervision/assistance      Frequency and Duration min 2x/week  2 weeks       Prognosis Prognosis for Safe Diet Advancement: Good Barriers to Reach Goals: Cognitive deficits;Severity of deficits      Swallow Study   General HPI: [name] is a 59 y.o. male with medical history significant for history of stroke with residual right-sided weakness, neurocognitive impairment, chronic combined systolic and diastolic CHF, type 2 diabetes, hypertension, hyperlipidemia, schizoaffective disorder, chronic left hip pain, and history of polysubstance use disorder admitted with with worsening right-sided weakness and dysarthria from his baseline. MRI small acute infarct of  the left pons. No hemorrhage or mass effect, multifocal chronic microhemorrhage  within the cerebellum and both cerebral hemispheres. Type of Study: Bedside Swallow Evaluation Previous Swallow Assessment: (none found) Diet Prior to this Study: Regular;Thin liquids Temperature Spikes Noted: No Respiratory Status: Room air History of Recent Intubation: No Behavior/Cognition: Alert;Cooperative;Requires cueing;Pleasant mood Oral Cavity Assessment: Other (comment)(could not fully view, pocketed food on right) Oral Care Completed by SLP: No Oral Cavity - Dentition: Missing dentition(no upper, missing posterior bilateral) Vision: Functional for self-feeding Self-Feeding Abilities: Able to feed self;Needs set up Patient Positioning: Upright in chair Baseline Vocal Quality: Other (comment)(intermittently wet ) Volitional Cough: Weak Volitional Swallow: Unable to elicit    Oral/Motor/Sensory Function Overall Oral Motor/Sensory Function: Mild impairment Facial ROM: Reduced right;Suspected CN VII (facial) dysfunction Facial Symmetry: Abnormal symmetry right;Suspected CN VII (facial) dysfunction Facial Strength: Reduced right;Suspected CN VII (facial) dysfunction Facial Sensation: Reduced right;Suspected CN V (Trigeminal) dysfunction   Ice Chips Ice chips: Not tested   Thin Liquid Thin Liquid: Impaired Presentation: Cup Pharyngeal  Phase Impairments: Throat Clearing - Immediate    Nectar Thick Nectar Thick Liquid: Not tested   Honey Thick Honey Thick Liquid: Not tested   Puree Puree: Not tested   Solid     Solid: Impaired Presentation: Self Fed Oral Phase Impairments: Reduced lingual movement/coordination;Poor awareness of bolus;Impaired mastication Oral Phase Functional Implications: Right lateral sulci pocketing Pharyngeal Phase Impairments: Throat Clearing - Immediate;Throat Clearing - Delayed;Cough - Delayed      Houston Siren 04/18/2020,1:53 PM Orbie Pyo Colvin Caroli.Ed Risk analyst [phone] Office [phone]

## 2020-04-18 NOTE — Progress Notes (Signed)
Inpatient Rehab Admissions Coordinator Note:   Per PT recommendations, pt was screened for CIR candidacy by Shann Medal, PT, DPT.  At this time we are recommending a CIR consult.  I will place an order per our protocol.  Please contact me with questions.   Shann Medal, PT, DPT [phone] 04/18/20 1:38 PM

## 2020-04-19 ENCOUNTER — Inpatient Hospital Stay (HOSPITAL_COMMUNITY): Payer: Medicaid Other

## 2020-04-19 DIAGNOSIS — I6930 Unspecified sequelae of cerebral infarction: Secondary | ICD-10-CM

## 2020-04-19 DIAGNOSIS — F1990 Other psychoactive substance use, unspecified, uncomplicated: Secondary | ICD-10-CM

## 2020-04-19 DIAGNOSIS — R531 Weakness: Secondary | ICD-10-CM

## 2020-04-19 DIAGNOSIS — I5042 Chronic combined systolic (congestive) and diastolic (congestive) heart failure: Secondary | ICD-10-CM

## 2020-04-19 DIAGNOSIS — F250 Schizoaffective disorder, bipolar type: Secondary | ICD-10-CM

## 2020-04-19 DIAGNOSIS — I10 Essential (primary) hypertension: Secondary | ICD-10-CM

## 2020-04-19 DIAGNOSIS — E1159 Type 2 diabetes mellitus with other circulatory complications: Secondary | ICD-10-CM

## 2020-04-19 DIAGNOSIS — N179 Acute kidney failure, unspecified: Secondary | ICD-10-CM

## 2020-04-19 DIAGNOSIS — E0822 Diabetes mellitus due to underlying condition with diabetic chronic kidney disease: Secondary | ICD-10-CM

## 2020-04-19 DIAGNOSIS — I6302 Cerebral infarction due to thrombosis of basilar artery: Secondary | ICD-10-CM

## 2020-04-19 DIAGNOSIS — N185 Chronic kidney disease, stage 5: Secondary | ICD-10-CM

## 2020-04-19 DIAGNOSIS — E876 Hypokalemia: Secondary | ICD-10-CM

## 2020-04-19 DIAGNOSIS — Z794 Long term (current) use of insulin: Secondary | ICD-10-CM

## 2020-04-19 LAB — GLUCOSE, CAPILLARY
Glucose-Capillary: 185 mg/dL — ABNORMAL HIGH (ref 70–99)
Glucose-Capillary: 152 mg/dL — ABNORMAL HIGH (ref 70–99)
Glucose-Capillary: 116 mg/dL — ABNORMAL HIGH (ref 70–99)
Glucose-Capillary: 163 mg/dL — ABNORMAL HIGH (ref 70–99)

## 2020-04-19 NOTE — Progress Notes (Signed)
Patient A&O x4. BP elevated, PRN parameters not met, all other vital signs stable. Free from falls. Pt tolerating fluids and meals. Patient went to Barium study and worked with PT. Pt voiding adequately during shift. DVT prophylactic: lovenox. No c/o of pain. Bed wheels locked. Phone and call bell within reach. Pt is resting, no distress. POC reviewed with daughter.

## 2020-04-19 NOTE — Progress Notes (Signed)
Physical Therapy Treatment Patient Details Name: [name] MRN: [id number] DOB: Sep 22, 1961 Today's Date: 04/19/2020    History of Present Illness Pt adm with worsening rt sided weakness and dysarthria from his baseline. Pt with prior CVA with residual rt sided weakness and neurocognitive impairment. MRI showed small acute infarct of left pons.  PMH -  Lt CVA, chf, DM, HTN, schizoaffective disorder, chronic lt hip pain, polysubstance abuse, gout.     PT Comments    Pt motivated to participate in mobility this session. Pt tolerated repeated transfer training, pre-gait tasks, and very limited gait tasks of lateral stepping. Pt requires significant R knee blocking and translation during stance and swing phase, respectively. Pt tolerated RLE exercises with significant physical assist, pt eager to get his strength back. Pt is a great CIR candidate, PT to continue to follow while acute.     Follow Up Recommendations  CIR;Supervision/Assistance - 24 hour     Equipment Recommendations  Other (comment)(To be determined)    Recommendations for Other Services       Precautions / Restrictions Precautions Precautions: Fall Restrictions Weight Bearing Restrictions: No    Mobility  Bed Mobility Overal bed mobility: Needs Assistance Bed Mobility: Rolling;Sidelying to Sit Rolling: Mod assist Sidelying to sit: Mod assist;+2 for safety/equipment;HOB elevated;+2 for physical assistance       General bed mobility comments: Mod assist for roll to L for RLE and truncal management, protection of RUE. Mod +2 for sidelying to sit for trunk elevation, lifting LEs off bed, and scooting to EOB with use of bed pad and lateral leaning L and R.  Transfers Overall transfer level: Needs assistance Equipment used: Rolling walker (2 wheeled) Transfers: Sit to/from Stand Sit to Stand: Mod assist;+2 physical assistance;+2 safety/equipment;From elevated surface         General transfer comment:  Mod assist +2 for power up, hip extension, R hand placement on RW, and steadying upon standing. Verbal cuing for hand placement when rising, sit to stand x3 with sustained stand ~30 seconds each.  Ambulation/Gait Ambulation/Gait assistance: Mod assist;+2 physical assistance;+2 safety/equipment Gait Distance (Feet): 2 Feet Assistive device: Rolling walker (2 wheeled) Gait Pattern/deviations: Step-to pattern;Decreased weight shift to right;Decreased step length - right Gait velocity: decr   General Gait Details: Lateral stepping towards HOB, Mod assist +2 for RLE knee flexion blocking, RLE translation towards LLE during side stepping. Verbal cuing for weight shifting L and R for side steps towards HOB. x2 trials   Stairs             Wheelchair Mobility    Modified Rankin (Stroke Patients Only) Modified Rankin (Stroke Patients Only) Pre-Morbid Rankin Score: Slight disability Modified Rankin: Moderately severe disability     Balance Overall balance assessment: Needs assistance Sitting-balance support: Feet supported;Single extremity supported Sitting balance-Leahy Scale: Fair Sitting balance - Comments: Able to sit EOB without PT assist, R lateral leaning noted but corrects posture with verbal cuing for upright (no pt awareness of leaning). Sat EOB x10 minutes total in between transfers, initially sitting up Postural control: Right lateral lean   Standing balance-Leahy Scale: Poor Standing balance comment: requires external assist of RW and +2, able to lean laterally with R knee blocking, standing tolerance ~30 seconds.                            Cognition Arousal/Alertness: Awake/alert Behavior During Therapy: WFL for tasks assessed/performed Overall Cognitive Status: No family/caregiver present to determine  baseline cognitive functioning                                 General Comments: Pt limited by dysarthric speech, appears cognitively  appropriate throughout session.      Exercises General Exercises - Lower Extremity Ankle Circles/Pumps: AROM;Left;5 reps;AAROM;Right;10 reps;Supine Quad Sets: AROM;Right;10 reps;Supine Heel Slides: AAROM;Right;10 reps;Supine(multimodal cuing for pt engagement, + hamstring contraction)    General Comments General comments (skin integrity, edema, etc.): HRmax 124 bpm      Pertinent Vitals/Pain Pain Assessment: Faces Faces Pain Scale: Hurts even more Pain Location: LLE, down the back of my leg Pain Descriptors / Indicators: Grimacing;Discomfort Pain Intervention(s): Limited activity within patient's tolerance;Monitored during session    Home Living                      Prior Function            PT Goals (current goals can now be found in the care plan section) Acute Rehab PT Goals Patient Stated Goal: go home PT Goal Formulation: With patient Time For Goal Achievement: 05/02/20 Potential to Achieve Goals: Good Progress towards PT goals: Progressing toward goals    Frequency    Min 4X/week      PT Plan Current plan remains appropriate    Co-evaluation              AM-PAC PT "6 Clicks" Mobility   Outcome Measure  Help needed turning from your back to your side while in a flat bed without using bedrails?: A Lot Help needed moving from lying on your back to sitting on the side of a flat bed without using bedrails?: A Lot Help needed moving to and from a bed to a chair (including a wheelchair)?: A Lot Help needed standing up from a chair using your arms (e.g., wheelchair or bedside chair)?: A Lot Help needed to walk in hospital room?: Total Help needed climbing 3-5 steps with a railing? : Total 6 Click Score: 10    End of Session Equipment Utilized During Treatment: Gait belt Activity Tolerance: Patient tolerated treatment well;Patient limited by fatigue Patient left: with call bell/phone within reach;in bed;with bed alarm set Nurse Communication:  Mobility status PT Visit Diagnosis: Unsteadiness on feet (R26.81);Other abnormalities of gait and mobility (R26.89);Hemiplegia and hemiparesis Hemiplegia - Right/Left: Right Hemiplegia - dominant/non-dominant: Non-dominant Hemiplegia - caused by: Cerebral infarction     Time: 1660-6301 PT Time Calculation (min) (ACUTE ONLY): 27 min  Charges:  $Therapeutic Activity: 8-22 mins $Neuromuscular Re-education: 8-22 mins                     Vanshika Jastrzebski E, PT Acute Rehabilitation Services Pager [phone]  Office [phone]   Yarel Rushlow D Elonda Husky 04/19/2020, 4:25 PM

## 2020-04-19 NOTE — Progress Notes (Signed)
Modified Barium Swallow Progress Note  Patient Details  Name: [name] MRN: [id number] Date of Birth: [birth year]  Today's Date: 04/19/2020  Modified Barium Swallow completed.  Full report located under Chart Review in the Imaging Section.  Brief recommendations include the following:  Clinical Impression  Pt demonstrates a moderate oral dysphagia with left labial weakness not causing any significant pocketing or anterior loss during this study, though it has been observed to cause pocketing at bedside. Pt is missing dentition and softens his solids with a slow oral prep, but relatively good lingual control. He clears any liquid residue with a second swallow consistently. Oropharyngeal phase is characterized by a smilgt delay in initiation with instance of sensed aspriation/penetration as swallow is initiation with liquid in pyriforms. Best method to reduce frequency and severity of aspiration was instruction for a smaller sip. Pt is likely to struggle with staying consistent with this, but overall, he senses and ejects all aspiration and penetration. Recommend continuing current diet of dys 2 solids and thin liquids, avoiding straws and taking small cup sips.    Swallow Evaluation Recommendations       SLP Diet Recommendations: Regular solids;Thin liquid   Liquid Administration via: Cup;No straw   Medication Administration: Whole meds with puree   Supervision: Patient able to self feed   Compensations: Slow rate;Small sips/bites   Postural Changes: Seated upright at 90 degrees   Oral Care Recommendations: Oral care BID       Herbie [place], MA [place] Pager [phone] Office [phone]  Lynann Beaver 04/19/2020,10:09 AM

## 2020-04-19 NOTE — Progress Notes (Signed)
Inpatient Rehabilitation-Admissions Coordinator   Inpatient Rehab Consult received.  I met with pt at the bedside as follow up from PM&R consult completed by Dr. Delice Lesch on 04/19/20 (see consult note for details). Discussed recommended rehab program with pt, including expectations, anticipated LOS, and expected functional outcomes. Pt very interested in this program and gave me permission to speak with his daughter Katharine Look to clarify support at DC. Katharine Look confirmed support as well as interest in this program as well.   Will follow up with pt and his daughter tomorrow for possible CIR admit, pending bed availability.   Raechel Ache, OTR/L  Rehab Admissions Coordinator  [phone] 04/19/2020 4:32 PM

## 2020-04-19 NOTE — PMR Pre-admission (Signed)
PMR Admission Coordinator Pre-Admission Assessment  Patient: [name] is an 58 y.o., male MRN: [id number] DOB: Feb 07, 1961 Height: 5\' 11"  (180.3 cm) Weight: 100 kg              Insurance Information HMO:     PPO:      PCP:      IPA:      80/20:      OTHER:  PRIMARY: Medicaid [place]      Policy#: [id number]      Subscriber: patient Coverage Code: [id number] CM Name:       Phone#:      Fax#:  Pre-Cert#:       Employer:  Benefits:  Phone #: [phone]     Name: verified eligibility online via [place] on 04/20/20 Eff. Date: verified eligibility on 04/20/20     Deduct:       Out of Pocket Max:      Life Max:   CIR: Covered per Medicaid guidelines      SNF:  Outpatient:      Co-Pay:  Home Health:       Co-Pay:  DME:      Co-Pay:  Providers:  SECONDARY: None      Policy#:       Phone#:   Development worker, community:       Phone#:   [employer] Engineer, [employer]" for patients in Inpatient Rehabilitation Facilities with attached "Privacy Act [name] Records" was provided and verbally reviewed with: N/A  Emergency Contact Information Contact Information    Name Relation Home Work [name], sandra Legal Guardian   [phone]   Anderson,Martavious Relative   [phone]   Grace,Donnell Relative   [phone]     Current Medical History  Patient Admitting Diagnosis: Left pons infarct  History of Present Illness: [name] is a 59 year old right-handed male with 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  Patient lives with children 1 level home 3 steps to entry.  Ambulated modified independent with a rolling walker prior to admission.  Family assistance as needed.  Presented 04/17/2020  with increasing right side weakness and dysarthria.  MRI identified small acute infarct of the left pons.  No hemorrhage or mass-effect.  Moderate chronic ischemic microangiopathy.  Patient did not receive TPA.  MRA without emergent large vessel occlusion.  CT angiogram head and neck advanced intracranial atherosclerosis with high-grade narrowing at the supraclinoid right ICA, left A1 origin, bilateral pericallosal arteries and bilateral PCA branches.  Echocardiogram with ejection fraction of 50%.  The left ventricle demonstrated global hypokinesis.  Admission chemistries BUN 23 creatinine 1.40 urine drug screen negative as well as alcohol negative.  Neurology follow-up maintained on aspirin 325 mg and Plavix for CVA prophylaxis x3 months then Plavix alone.  Subcutaneous Lovenox for DVT prophylaxis.  Currently on a dysphagia #2 thin liquid diet.  Therapy evaluations completed and patient is to be admitted for a comprehensive rehab program on 04/20/20.  Complete NIHSS TOTAL: 8    Past Medical History  Past Medical History:  Diagnosis Date  . Alcohol abuse   . Arthritis   . Crack cocaine use   . Depression   . Diabetes mellitus   . Drug abuse ([place])   . DTs (delirium tremens) ([place])    history  of   . Gout   . Noncompliance with medication regimen   . Schizophrenia ([place])   . Stroke ([place])   . Systolic heart failure secondary to hypertension ([place])   . Uncontrolled hypertension     Family History  family history includes Hypertension in an other family member.  Prior Rehab/Hospitalizations:  Has the patient had prior rehab or hospitalizations prior to admission? Yes  Has the patient had major surgery during 100 days prior to admission? No  Current Medications   Current Facility-Administered Medications:  .  acetaminophen (TYLENOL) tablet 650 mg, 650 mg, Oral, Q4H PRN, 650 mg at 04/18/20 1643 **OR** acetaminophen (TYLENOL) 160 MG/5ML solution 650 mg, 650 mg, Per Tube, Q4H PRN **OR**  acetaminophen (TYLENOL) suppository 650 mg, 650 mg, Rectal, Q4H PRN, Zada Finders R, MD .  aspirin EC tablet 325 mg, 325 mg, Oral, Daily, Rosalin Hawking, MD, 325 mg at 04/20/20 0836 .  atorvastatin (LIPITOR) tablet 40 mg, 40 mg, Oral, Daily, Zada Finders R, MD, 40 mg at 04/20/20 0837 .  chlorhexidine (PERIDEX) 0.12 % solution 15 mL, 15 mL, Mouth Rinse, BID, Ghimire, Henreitta Leber, MD, 15 mL at 04/20/20 0837 .  clopidogrel (PLAVIX) tablet 75 mg, 75 mg, Oral, Daily, Zada Finders R, MD, 75 mg at 04/20/20 0836 .  diclofenac Sodium (VOLTAREN) 1 % topical gel 2 g, 2 g, Topical, QID, Lenore Cordia, MD, 2 g at 04/20/20 0837 .  DULoxetine (CYMBALTA) DR capsule 30 mg, 30 mg, Oral, BID, Zada Finders R, MD, 30 mg at 04/20/20 0836 .  enoxaparin (LOVENOX) injection 40 mg, 40 mg, Subcutaneous, Q24H, Zada Finders R, MD, 40 mg at 04/20/20 8841 .  gabapentin (NEURONTIN) capsule 300 mg, 300 mg, Oral, TID, Zada Finders R, MD, 300 mg at 04/20/20 0841 .  insulin aspart (novoLOG) injection 0-9 Units, 0-9 Units, Subcutaneous, TID WC, Lenore Cordia, MD, 1 Units at 04/20/20 0835 .  labetalol (NORMODYNE) injection 10 mg, 10 mg, Intravenous, Q4H PRN, Lenore Cordia, MD, 10 mg at 04/19/20 2318 .  MEDLINE mouth rinse, 15 mL, Mouth Rinse, q12n4p, Ghimire, Henreitta Leber, MD, 15 mL at 04/19/20 1711 .  paliperidone (INVEGA) 24 hr tablet 3 mg, 3 mg, Oral, Daily, Zada Finders R, MD, 3 mg at 04/20/20 0837 .  senna-docusate (Senokot-S) tablet 1 tablet, 1 tablet, Oral, QHS PRN, Lenore Cordia, MD  Patients Current Diet:  Diet Order            Diet - low sodium heart healthy        Diet Carb Modified        DIET DYS 2 Room service appropriate? Yes with Assist; Fluid consistency: Thin  Diet effective now              Precautions / Restrictions Precautions Precautions: Fall Restrictions Weight Bearing Restrictions: No   Has the patient had 2 or more falls or a fall with injury in the past year?No  Prior Activity  Level Community (5-7x/wk): per daugther, she tries to get him out of the house daily with him; He uses RW for mobility and is Mod I/Supervision at baseline.   Prior Functional Level Prior Function Level of Independence: Needs assistance Gait / Transfers Assistance Needed: amb modified independent with rolling walker Comments: Uses RW  Self Care: Did the patient need help bathing, dressing, using the toilet or eating?  Independent  Indoor Mobility: Did the patient need assistance with walking from room to room (with or without device)? Independent  Stairs: Did the patient need assistance with internal or external stairs (with or without device)? Needed some help  Functional Cognition: Did the patient need help planning regular tasks such as shopping or remembering to take medications? Needed some help  Home Assistive Devices / [place] Devices/Equipment: Gilford Rile (specify type)(Front wheeled walker.) Home Equipment: Walker - 2 wheels  Prior Device Use: Indicate devices/aids used by the patient prior to current illness, exacerbation or injury? Walker  Current Functional Level Cognition  Overall Cognitive Status: No family/caregiver present to determine baseline cognitive functioning Orientation Level: Oriented X4 General Comments: Pt limited by dysarthric speech, appears cognitively appropriate throughout session.    Extremity Assessment (includes Sensation/Coordination)  Upper Extremity Assessment: Generalized weakness, RUE deficits/detail RUE Deficits / Details: gross strength 3/5, able to move through full ROM.  Decreased coordination. RUE Sensation: WNL RUE Coordination: decreased fine motor, decreased gross motor  Lower Extremity Assessment: Defer to PT evaluation RLE Deficits / Details: grossly 3+/5 strength    ADLs  Overall ADL's : Needs assistance/impaired Eating/Feeding: Sitting, Minimal assistance Grooming: Minimal assistance, Sitting, Oral care Upper  Body Bathing: Minimal assistance, Sitting Lower Body Bathing: Moderate assistance, Sitting/lateral leans Upper Body Dressing : Minimal assistance, Sitting Lower Body Dressing: Moderate assistance, Sitting/lateral leans Toilet Transfer: Maximal assistance, Stand-pivot Functional mobility during ADLs: Maximal assistance    Mobility  Overal bed mobility: Needs Assistance Bed Mobility: Rolling, Sidelying to Sit Rolling: Mod assist Sidelying to sit: Mod assist, +2 for safety/equipment, HOB elevated, +2 for physical assistance Sit to supine: Mod assist General bed mobility comments: Mod assist for roll to L for RLE and truncal management, protection of RUE. Mod +2 for sidelying to sit for trunk elevation, lifting LEs off bed, and scooting to EOB with use of bed pad and lateral leaning L and R.    Transfers  Overall transfer level: Needs assistance Equipment used: Rolling walker (2 wheeled) Transfer via Lift Equipment: Stedy Transfers: Sit to/from Stand Sit to Stand: Mod assist, +2 physical assistance, +2 safety/equipment, From elevated surface Stand pivot transfers: Max assist General transfer comment: Mod assist +2 for power up, hip extension, R hand placement on RW, and steadying upon standing. Verbal cuing for hand placement when rising, sit to stand x3 with sustained stand ~30 seconds each.    Ambulation / Gait / Stairs / Wheelchair Mobility  Ambulation/Gait Ambulation/Gait assistance: Mod assist, +2 physical assistance, +2 safety/equipment Gait Distance (Feet): 2 Feet Assistive device: Rolling walker (2 wheeled) Gait Pattern/deviations: Step-to pattern, Decreased weight shift to right, Decreased step length - right General Gait Details: Lateral stepping towards HOB, Mod assist +2 for RLE knee flexion blocking, RLE translation towards LLE during side stepping. Verbal cuing for weight shifting L and R for side steps towards HOB. x2 trials Gait velocity: decr    Posture / Balance Dynamic  Sitting Balance Sitting balance - Comments: Able to sit EOB without PT assist, R lateral leaning noted but corrects posture with verbal cuing for upright (no pt awareness of leaning). Sat EOB x10 minutes total in between transfers, initially sitting up Balance Overall balance assessment: Needs assistance Sitting-balance support: Feet supported, Single extremity supported Sitting balance-Leahy Scale: Fair Sitting balance - Comments: Able to sit EOB without PT assist, R lateral leaning noted but corrects posture with verbal cuing for upright (no pt awareness of leaning). Sat EOB x10 minutes total in between transfers, initially sitting up Postural control: Right lateral lean Standing balance-Leahy Scale: Poor Standing balance comment: requires external assist of RW  and +2, able to lean laterally with R knee blocking, standing tolerance ~30 seconds.    Special needs/care consideration  Diabetic management: yes   Designated visitor: daughter Katharine Look     Previous Home Environment (from acute therapy documentation) Living Arrangements: Children Available Help at Discharge: Family, Available 24 hours/day Type of Home: House Home Layout: One level Home Access: Stairs to enter Entrance Stairs-Rails: None Entrance Stairs-Number of Steps: 3 Bathroom Shower/Tub: Chiropodist: [place]: No  Discharge Living Setting Plans for Discharge Living Setting: Lives with (comment), Apartment(lives with daughter) Type of Home at Discharge: Apartment Discharge Home Layout: One level Discharge Home Access: Stairs to enter Entrance Stairs-Rails: None Entrance Stairs-Number of Steps: 1 curb step  Discharge Bathroom Shower/Tub: Tub/shower unit Discharge Bathroom Toilet: Standard Discharge Bathroom Accessibility: No How Accessible: Other (comment)(daughter reports tight bathroom space) Does the patient have any problems obtaining your medications?:  No  Social/Family/Support Systems Patient Roles: Other (Comment)(lives with daughter) Contact Information: Katharine Look (daughter): [phone] Anticipated Caregiver: daughter Anticipated Caregiver's Contact Information: see above Ability/Limitations of Caregiver: Supervision Caregiver Availability: 24/7(close to 24/7 (says she works from home)) Discharge Plan Discussed with Primary Caregiver: Yes(pt and his daughter) Is Caregiver In Agreement with Plan?: Yes Does Caregiver/Family have Issues with Lodging/Transportation while Pt is in Rehab?: No   Goals Patient/Family Goal for Rehab: PT/OT: Mod I/Supervision; SLP: Mod I Expected length of stay: 12-15 days Cultural Considerations: NA Pt/Family Agrees to Admission and willing to participate: Yes Program Orientation Provided & Reviewed with Pt/Caregiver Including Roles  & Responsibilities: Yes(pt and his daughter)  Barriers to Discharge: Home environment access/layout  Barriers to Discharge Comments: one step to enter apartment (curb).    Decrease burden of Care through IP rehab admission: NA   Possible need for SNF placement upon discharge:Not anticipated; pt has good social support at DC from his daughter whom he lives with and can provide recommended supervision at DC. Pt and daughter understand the plan is to DC home after CIR stay.    Patient Condition: This patient's medical and functional status has changed since the consult dated: 04/19/20 in which the Rehabilitation Physician determined and documented that the patient's condition is appropriate for intensive rehabilitative care in an inpatient rehabilitation facility. Medical changes are: on night of 6/2, pt with elevated BP, requiring IV labetalol.  Functional changes are: pt able to attempt gait, completing 2 feet with Mod A +2 using RW. After evaluating the patient today and speaking with the Rehabilitation physician and acute team, the patient remains appropriate for inpatient rehab.  Will admit to inpatient rehab today.  Preadmission Screen Completed By:  Raechel Ache, OT, 04/20/2020 10:57 AM ______________________________________________________________________   Discussed status with Dr. Dagoberto Ligas on 04/20/20 at 10:56AM and received approval for admission today.  Admission Coordinator:  Raechel Ache, time 10:56AM/Date 04/20/20.

## 2020-04-19 NOTE — Progress Notes (Addendum)
PROGRESS NOTE        PATIENT DETAILS Name: [name] Age: 59 y.o. Sex: male Date of Birth: December 13, 1960 Admit Date: 04/17/2020 Admitting Physician Evalee Mutton Kristeen Mans, MD TOI:ZTIW, [place]ze, FNP  Brief Narrative: Patient is a 59 y.o. male with prior history of CVA with chronic residual right-sided weakness, chronic combined systolic/diastolic heart failure, DM-2, HTN, HLD, schizoaffective disorder who presented with worsening right-sided weakness and dysarthria.  He was subsequently admitted for further evaluation and treatment.  Significant events: 5/31>> admit for evaluation of worsening right-sided weakness and dysarthria (more than usual baseline)  Significant studies: 6/1>> CT angio head/neck: Advanced intracranial atherosclerosis with high-grade narrowings at the supraclinoid right ICA, left A1 origin, bilateral pericallosal arteries, bilateral PCA branches, cervical carotid atherosclerosis without flow-limiting stenosis. 6/1>> MRI brain: Small infarct left pons, punctate focus of DWI intensity in the right frontal white matter. 6/1>> Echo: EF 45-50% 5/31>> CT head: No acute intracranial abnormality 5/31>> chest x-ray: No active disease.  Antimicrobial therapy: None  Microbiology data: None  Procedures : None  Consults: Neurology  DVT Prophylaxis : Prophylactic Lovenox   Subjective: Remains unchanged-he continues to have dysarthria and more right-sided weakness than usual.  Assessment/Plan: Acute CVA: Likely secondary to small vessel disease.  Continues to have some dysarthria and right-sided weakness more than usual baseline.-not sure if this is his baseline.  Imaging studies as noted above.  A1c 7.7, LDL 114-echo with slightly reduced EF but without any embolic source.  Continue dual antiplatelet agents with aspirin/Plavix for 3 months, then Plavix alone.  Continue statin.  Evaluated by rehab services-recommendations are for  CIR.  AKI: Appears to be mild-likely hemodynamically mediated-resolved.  Repeat electrolytes periodically.  Hypertension:allow permissive HTN-given acute CVA.  Continue to hold all antihypertensive agents.  Chronic combined systolic and diastolic CHF (EF 58-09% by TTE 02/01/2018): Euvolemic on exam-follow closely.   DM-2 (A1c 7.7 on 6/1): CBGs stable on SSI-Metformin on hold.  Further optimization deferred to the outpatient setting   Recent Labs    04/18/20 2117 04/19/20 0749 04/19/20 1151  GLUCAP 178* 185* 152*    Hyperlipidemia: Continue statin  Schizoaffective disorder: Stable-continue Invega and Cymbalta  History of polysubstance use disorder:Patient denies any recent illicit drug, alcohol, or tobacco use.  UDS and EtOH levels are negative  Obesity: Estimated body mass index is 30.84 kg/m as calculated from the following:   Height as of this encounter: 5\' 11"  (1.803 m).   Weight as of this encounter: 100.3 kg.    Diet: Diet Order            DIET DYS 2 Room service appropriate? Yes with Assist; Fluid consistency: Thin  Diet effective now               Code Status: Full code   Family Communication: None at bedside-we will reach out to family over the next few days once work-up is complete.  Disposition Plan: Status is: Inpatient  The patient will require care spanning > 2 midnights and should be moved to inpatient because: Inpatient level of care appropriate due to severity of illness  Dispo: The patient is from: Home              Anticipated d/c is to: TBD              Anticipated d/c date is: 1 day  Patient currently is medically stable to d/c.  Barriers to Discharge: Await CIR bed.  Antimicrobial agents: Anti-infectives (From admission, onward)   None      Time spent: 25 minutes-Greater than 50% of this time was spent in counseling, explanation of diagnosis, planning of further management, and coordination of  care.  MEDICATIONS: Scheduled Meds: . aspirin EC  325 mg Oral Daily  . atorvastatin  40 mg Oral Daily  . chlorhexidine  15 mL Mouth Rinse BID  . clopidogrel  75 mg Oral Daily  . diclofenac Sodium  2 g Topical QID  . DULoxetine  30 mg Oral BID  . enoxaparin (LOVENOX) injection  40 mg Subcutaneous Q24H  . gabapentin  300 mg Oral TID  . insulin aspart  0-9 Units Subcutaneous TID WC  . mouth rinse  15 mL Mouth Rinse q12n4p  . paliperidone  3 mg Oral Daily   Continuous Infusions: PRN Meds:.acetaminophen **OR** acetaminophen (TYLENOL) oral liquid 160 mg/5 mL **OR** acetaminophen, labetalol, senna-docusate   PHYSICAL EXAM: Vital signs: Vitals:   04/19/20 0752 04/19/20 0928 04/19/20 1142 04/19/20 1315  BP: (!) 151/108 (!) 146/106 (!) 151/116 (!) 156/113  Pulse: 78 89 98   Resp: 16 20 18    Temp: 98 F (36.7 C) 98 F (36.7 C) 98.3 F (36.8 C)   TempSrc: Oral     SpO2: 97% 99% 100%   Weight:      Height:       Filed Weights   04/17/20 2100 04/17/20 2209 04/18/20 0141  Weight: 101.1 kg 101.1 kg 100.3 kg   Body mass index is 30.84 kg/m.   Gen Exam:Alert awake-not in any distress HEENT:atraumatic, normocephalic Chest: B/L clear to auscultation anteriorly CVS:S1S2 regular Abdomen:soft non tender, non distended Extremities:no edema Neurology: Slight dysarthria-continues to have slight right-sided-sided deficits. Skin: no rash  I have personally reviewed following labs and imaging studies  LABORATORY DATA: CBC: Recent Labs  Lab 04/17/20 2146 04/17/20 2150  WBC  --  7.7  NEUTROABS  --  3.9  HGB 12.9* 12.5*  HCT 38.0* 39.1  MCV  --  85.2  PLT  --  643    Basic Metabolic Panel: Recent Labs  Lab 04/17/20 2146 04/17/20 2150 04/18/20 0556  NA 142 139 139  K 3.7 3.7 3.3*  CL 106 106 101  CO2  --  25 25  GLUCOSE 141* 143* 167*  BUN 23* 23* 15  CREATININE 1.40* 1.40* 1.19  CALCIUM  --  9.3 9.1  PHOS  --   --  3.5    GFR: Estimated Creatinine Clearance: 82.6  mL/min (by C-G formula based on SCr of 1.19 mg/dL).  Liver Function Tests: Recent Labs  Lab 04/17/20 2150 04/18/20 0556  AST 21  --   ALT 11  --   ALKPHOS 75  --   BILITOT 0.8  --   PROT 7.5  --   ALBUMIN 4.1 3.7   No results for input(s): LIPASE, AMYLASE in the last 168 hours. No results for input(s): AMMONIA in the last 168 hours.  Coagulation Profile: Recent Labs  Lab 04/17/20 2150  INR 0.9    Cardiac Enzymes: No results for input(s): CKTOTAL, CKMB, CKMBINDEX, TROPONINI in the last 168 hours.  BNP (last 3 results) No results for input(s): PROBNP in the last 8760 hours.  Lipid Profile: Recent Labs    04/18/20 0556  CHOL 195  HDL 44  LDLCALC 114*  TRIG 183*  CHOLHDL 4.4    Thyroid Function  Tests: No results for input(s): TSH, T4TOTAL, FREET4, T3FREE, THYROIDAB in the last 72 hours.  Anemia Panel: No results for input(s): VITAMINB12, FOLATE, FERRITIN, TIBC, IRON, RETICCTPCT in the last 72 hours.  Urine analysis:    Component Value Date/Time   COLORURINE YELLOW 04/17/2020 2218   APPEARANCEUR CLEAR 04/17/2020 2218   LABSPEC 1.020 04/17/2020 2218   PHURINE 6.0 04/17/2020 2218   GLUCOSEU NEGATIVE 04/17/2020 2218   HGBUR NEGATIVE 04/17/2020 2218   BILIRUBINUR NEGATIVE 04/17/2020 2218   KETONESUR NEGATIVE 04/17/2020 2218   PROTEINUR 100 (A) 04/17/2020 2218   UROBILINOGEN 0.2 08/27/2015 0210   NITRITE NEGATIVE 04/17/2020 2218   LEUKOCYTESUR NEGATIVE 04/17/2020 2218    Sepsis Labs: Lactic Acid, Venous    Component Value Date/Time   LATICACIDVEN 2.9 ([place]) 08/27/2015 0231    MICROBIOLOGY: Recent Results (from the past 240 hour(s))  SARS Coronavirus 2 by RT PCR (hospital order, performed in [place] hospital lab) Nasopharyngeal Nasopharyngeal Swab     Status: None   Collection Time: 04/18/20 12:25 AM   Specimen: Nasopharyngeal Swab  Result Value Ref Range Status   SARS Coronavirus 2 NEGATIVE NEGATIVE Final    Comment: (NOTE) SARS-CoV-2 target  nucleic acids are NOT DETECTED. The SARS-CoV-2 RNA is generally detectable in upper and lower respiratory specimens during the acute phase of infection. The lowest concentration of SARS-CoV-2 viral copies this assay can detect is 250 copies / mL. A negative result does not preclude SARS-CoV-2 infection and should not be used as the sole basis for treatment or other patient management decisions.  A negative result may occur with improper specimen collection / handling, submission of specimen other than nasopharyngeal swab, presence of viral mutation(s) within the areas targeted by this assay, and inadequate number of viral copies (<250 copies / mL). A negative result must be combined with clinical observations, patient history, and epidemiological information. Fact Sheet for Patients:   StrictlyIdeas.no Fact Sheet for Healthcare Providers: BankingDealers.co.za This test is not yet approved or cleared  by the [place] FDA and has been authorized for detection and/or diagnosis of SARS-CoV-2 by FDA under an Emergency Use Authorization (EUA).  This EUA will remain in effect (meaning this test can be used) for the duration of the COVID-19 declaration under Section 564(b)(1) of the Act, 21 U.S.C. section 360bbb-3(b)(1), unless the authorization is terminated or revoked sooner. Performed at [facility], [place] [address], [place], [place] [address]     RADIOLOGY STUDIES/RESULTS: CT ANGIO HEAD W OR WO CONTRAST  Result Date: 04/18/2020 CLINICAL DATA:  Stroke follow-up EXAM: CT ANGIOGRAPHY HEAD AND NECK TECHNIQUE: Multidetector CT imaging of the head and neck was performed using the standard protocol during bolus administration of intravenous contrast. Multiplanar CT image reconstructions and MIPs were obtained to evaluate the vascular anatomy. Carotid stenosis measurements (when applicable) are obtained utilizing NASCET criteria, using the  distal internal carotid diameter as the denominator. CONTRAST:  4mL OMNIPAQUE IOHEXOL 350 MG/ML SOLN COMPARISON:  Brain MRI and MRA from earlier today FINDINGS: CTA NECK FINDINGS Aortic arch: Atheromatous wall thickening.  No acute finding. Right carotid system: Tortuous with partial retropharyngeal course. Mild atheromatous changes at the bifurcation and distal common carotid. No flow limiting stenosis or ulceration. Left carotid system: Mild atheromatous plaque at the proximal common carotid. No stenosis or ulceration. Vertebral arteries: No proximal subclavian stenosis. The codominant vertebral arteries are smooth and widely patent to the dura. Skeleton: Advanced mid cervical disc degeneration with disc space collapse and endplate sclerosis.  Other neck: No acute finding. Upper chest: No acute finding Review of the MIP images confirms the above findings CTA HEAD FINDINGS Anterior circulation: Atheromatous plaque along the bilateral carotid siphons with 45% left periophthalmic ICA narrowing and 75% narrowing at the supraclinoid right ICA. Severe bilateral pericallosal stenosis with areas of imperceptible luminal enhancement. Severe left A1 origin stenosis. Accounting for atherosclerotic irregularity no aneurysm is seen. Posterior circulation: The vertebral and basilar arteries are smooth and widely patent. Advanced atheromatous type narrowing of bilateral P4 branches. Venous sinuses: Diffusely patent Anatomic variants: None significant Review of the MIP images confirms the above findings IMPRESSION: 1. Advanced intracranial atherosclerosis with high-grade narrowings at the supraclinoid right ICA, left A1 origin, bilateral pericallosal arteries, and bilateral PCA branches. 2. Cervical carotid atherosclerosis without flow limiting stenosis in the neck. Electronically Signed   By: Monte Fantasia M.D.   On: 04/18/2020 08:14   DG Chest 1 View  Result Date: 04/17/2020 CLINICAL DATA:  59 year old male with cough and  chest pain. EXAM: CHEST  1 VIEW COMPARISON:  Chest radiograph dated 02/07/2020. FINDINGS: The heart size and mediastinal contours are within normal limits. Both lungs are clear. The visualized skeletal structures are unremarkable. IMPRESSION: No active disease. Electronically Signed   By: Anner Crete M.D.   On: 04/17/2020 22:36   CT ANGIO NECK W OR WO CONTRAST  Result Date: 04/18/2020 CLINICAL DATA:  Stroke follow-up EXAM: CT ANGIOGRAPHY HEAD AND NECK TECHNIQUE: Multidetector CT imaging of the head and neck was performed using the standard protocol during bolus administration of intravenous contrast. Multiplanar CT image reconstructions and MIPs were obtained to evaluate the vascular anatomy. Carotid stenosis measurements (when applicable) are obtained utilizing NASCET criteria, using the distal internal carotid diameter as the denominator. CONTRAST:  66mL OMNIPAQUE IOHEXOL 350 MG/ML SOLN COMPARISON:  Brain MRI and MRA from earlier today FINDINGS: CTA NECK FINDINGS Aortic arch: Atheromatous wall thickening.  No acute finding. Right carotid system: Tortuous with partial retropharyngeal course. Mild atheromatous changes at the bifurcation and distal common carotid. No flow limiting stenosis or ulceration. Left carotid system: Mild atheromatous plaque at the proximal common carotid. No stenosis or ulceration. Vertebral arteries: No proximal subclavian stenosis. The codominant vertebral arteries are smooth and widely patent to the dura. Skeleton: Advanced mid cervical disc degeneration with disc space collapse and endplate sclerosis. Other neck: No acute finding. Upper chest: No acute finding Review of the MIP images confirms the above findings CTA HEAD FINDINGS Anterior circulation: Atheromatous plaque along the bilateral carotid siphons with 45% left periophthalmic ICA narrowing and 75% narrowing at the supraclinoid right ICA. Severe bilateral pericallosal stenosis with areas of imperceptible luminal  enhancement. Severe left A1 origin stenosis. Accounting for atherosclerotic irregularity no aneurysm is seen. Posterior circulation: The vertebral and basilar arteries are smooth and widely patent. Advanced atheromatous type narrowing of bilateral P4 branches. Venous sinuses: Diffusely patent Anatomic variants: None significant Review of the MIP images confirms the above findings IMPRESSION: 1. Advanced intracranial atherosclerosis with high-grade narrowings at the supraclinoid right ICA, left A1 origin, bilateral pericallosal arteries, and bilateral PCA branches. 2. Cervical carotid atherosclerosis without flow limiting stenosis in the neck. Electronically Signed   By: Monte Fantasia M.D.   On: 04/18/2020 08:14   MR ANGIO HEAD WO CONTRAST  Result Date: 04/18/2020 CLINICAL DATA:  Pontine infarct EXAM: MRA HEAD WITHOUT CONTRAST TECHNIQUE: Angiographic images of the Circle of Willis were obtained using MRA technique without intravenous contrast. COMPARISON:  Brain MRI 04/18/2020 FINDINGS: Examination is  degraded mildly by motion. POSTERIOR CIRCULATION: --Basilar artery: Normal. --Superior cerebellar arteries: Normal. --Posterior cerebral arteries: Severe stenosis of the right PCA proximal P3 segment. Normal left PCA. ANTERIOR CIRCULATION: --Intracranial internal carotid arteries: Normal. --Anterior cerebral arteries (ACA): Normal. Both A1 segments are present. Patent anterior communicating artery (a-comm). --Middle cerebral arteries (MCA): Normal. IMPRESSION: Motion degraded study without emergent large vessel occlusion. Electronically Signed   By: Ulyses Jarred M.D.   On: 04/18/2020 03:54   MR BRAIN WO CONTRAST  Result Date: 04/18/2020 CLINICAL DATA:  Stroke follow-up EXAM: MRI HEAD WITHOUT CONTRAST TECHNIQUE: Multiplanar, multiecho pulse sequences of the brain and surrounding structures were obtained without intravenous contrast. COMPARISON:  None. FINDINGS: Brain: There is a small acute infarct of the left  pons. There is a punctate focus of DWI hyperintensity in the right frontal white matter without a clear correlate on ADC. Early confluent hyperintense T2-weighted signal of the periventricular and deep white matter, most commonly due to chronic ischemic microangiopathy. Old bilateral basal ganglia small vessel infarcts. Multifocal chronic microhemorrhage within the cerebellum and both cerebral hemispheres. There are 5-7 foci total. Normal midline structures. Vascular: Normal flow voids. Skull and upper cervical spine: Normal marrow signal. Sinuses/Orbits: Negative. Other: None. IMPRESSION: 1. Small acute infarct of the left pons. No hemorrhage or mass effect. 2. Punctate focus of DWI hyperintensity in the right frontal white matter without a clear correlate on ADC. This may indicate a small acute or subacute infarct. 3. Moderate chronic ischemic microangiopathy. 4. Multifocal chronic microhemorrhage within the cerebellum and both cerebral hemispheres. Electronically Signed   By: Ulyses Jarred M.D.   On: 04/18/2020 03:21   ECHOCARDIOGRAM COMPLETE  Result Date: 04/18/2020    ECHOCARDIOGRAM REPORT   Patient Name:   [name] Date of Exam: 04/18/2020 Medical Rec #:  [id number]            Height:       71.0 in Accession #:    [id number]           Weight:       221.1 lb Date of Birth:  01-18-1961           BSA:          2.200 m Patient Age:    67 years             BP:           152/113 mmHg Patient Gender: M                    HR:           81 bpm. Exam Location:  Inpatient Procedure: 2D Echo, Cardiac Doppler and Color Doppler Indications:    Stroke 434.91 / I163.9  History:        Patient has prior history of Echocardiogram examinations, most                 recent 02/01/2018. CHF; Risk Factors:Hypertension and Diabetes.                 Alcohol abuse. Cocaine abuse.  Sonographer:    Jonelle Sidle Dance Referring Phys: [id number] [place]  1. Left ventricular ejection fraction, by estimation, is 45 to  50%. The left ventricle has mildly decreased function. The left ventricle demonstrates global hypokinesis. Left ventricular diastolic parameters are indeterminate.  2. Right ventricular systolic function is normal. The right ventricular size is normal.  3. The mitral valve is normal in structure. Trivial  mitral valve regurgitation. No evidence of mitral stenosis.  4. Unable to determine valve morphology due to image quality. Aortic valve regurgitation is not visualized. No aortic stenosis is present.  5. Aortic dilatation noted. 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  6. The inferior vena cava is dilated in size with >50% respiratory variability, suggesting right atrial pressure of 8 mmHg. FINDINGS  Left Ventricle: Left ventricular ejection fraction, by estimation, is 45 to 50%. The left ventricle has mildly decreased function. The left ventricle demonstrates global hypokinesis. The left ventricular internal cavity size was normal in size. There is  borderline left ventricular hypertrophy. Left ventricular diastolic parameters are indeterminate. Right Ventricle: The right ventricular size is normal. No increase in right ventricular wall thickness. Right ventricular systolic function is normal. Left Atrium: Left atrial size was normal in size. Right Atrium: Right atrial size was normal in size. Pericardium: There is no evidence of pericardial effusion. Mitral Valve: The mitral valve is normal in structure. Trivial mitral valve regurgitation. No evidence of mitral valve stenosis. Tricuspid Valve: The tricuspid valve is normal in structure. Tricuspid valve regurgitation is trivial. Aortic Valve: Unable to determine valve morphology due to image quality. Aortic valve regurgitation is not visualized. No aortic stenosis is present. Pulmonic Valve: The pulmonic valve was grossly normal. Pulmonic valve regurgitation is not visualized. No evidence of pulmonic stenosis. Aorta: Aortic dilatation  noted. 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 Venous: The inferior vena cava is dilated in size with greater than 50% respiratory variability, suggesting right atrial pressure of 8 mmHg. IAS/Shunts: No atrial level shunt detected by color flow Doppler.  LEFT VENTRICLE PLAX 2D LVIDd:         5.40 cm  Diastology LVIDs:         4.10 cm  LV e' lateral:   8.38 cm/s LV PW:         1.00 cm  LV E/e' lateral: 10.8 LV IVS:        1.30 cm  LV e' medial:    5.22 cm/s LVOT diam:     2.30 cm  LV E/e' medial:  17.3 LV SV:         74 LV SV Index:   33 LVOT Area:     4.15 cm  RIGHT VENTRICLE             IVC RV Basal diam:  2.30 cm     IVC diam: 2.20 cm RV S prime:     14.50 cm/s TAPSE (M-mode): 1.8 cm LEFT ATRIUM             Index LA diam:        3.60 cm 1.64 cm/m LA Vol (A2C):   63.9 ml 29.04 ml/m LA Vol (A4C):   40.7 ml 18.50 ml/m LA Biplane Vol: 52.8 ml 23.99 ml/m  AORTIC VALVE LVOT Vmax:   90.20 cm/s LVOT Vmean:  59.700 cm/s LVOT VTI:    0.177 m  AORTA Ao Root diam: 4.10 cm Ao Asc diam:  4.00 cm MITRAL VALVE MV Area (PHT): 5.01 cm    SHUNTS MV Decel Time: 152 msec    Systemic VTI:  0.18 m MV E velocity: 90.50 cm/s  Systemic Diam: 2.30 cm Buford Dresser MD Electronically signed by Buford Dresser MD Signature Date/Time: 04/18/2020/10:21:13 PM    Final    CT HEAD CODE STROKE WO CONTRAST  Result Date: 04/17/2020 CLINICAL DATA:  Code stroke.  Right-sided  facial droop EXAM: CT HEAD WITHOUT CONTRAST TECHNIQUE: Contiguous axial images were obtained from the base of the skull through the vertex without intravenous contrast. COMPARISON:  03/03/2018 FINDINGS: Brain: There is no mass, hemorrhage or extra-axial collection. The size and configuration of the ventricles and extra-axial CSF spaces are normal. There is hypoattenuation of the periventricular white matter, most commonly indicating chronic ischemic microangiopathy. There are small vessel infarcts of the left basal ganglia that are  likely chronic. Vascular: No abnormal hyperdensity of the major intracranial arteries or dural venous sinuses. No intracranial atherosclerosis. Skull: The visualized skull base, calvarium and extracranial soft tissues are normal. Sinuses/Orbits: No fluid levels or advanced mucosal thickening of the visualized paranasal sinuses. No mastoid or middle ear effusion. The orbits are normal. ASPECTS [facility] Stroke Program Early CT Score) - Ganglionic level infarction (caudate, lentiform nuclei, internal capsule, insula, M1-M3 cortex): 7 - Supraganglionic infarction (M4-M6 cortex): 3 Total score (0-10 with 10 being normal): 10 IMPRESSION: 1. No acute intracranial abnormality. 2. ASPECTS is 10. 3. Chronic ischemic microangiopathy and small vessel infarcts of the left basal ganglia that are likely old. 4. These results were communicated to Dr. Amie Portland at 9:58 pm on 04/17/2020 by text page via the [facility] messaging system. Electronically Signed   By: Ulyses Jarred M.D.   On: 04/17/2020 21:58     LOS: 1 day   Oren Binet, MD  [facility]    To contact the attending provider between 7A-7P or the covering provider during after hours 7P-7A, please log into the web site [email or web address] and access using universal [place] password for that web site. If you do not have the password, please call the hospital operator.  04/19/2020, 3:16 PM

## 2020-04-20 ENCOUNTER — Other Ambulatory Visit: Payer: Self-pay

## 2020-04-20 ENCOUNTER — Inpatient Hospital Stay (HOSPITAL_COMMUNITY)
Admission: RE | Admit: 2020-04-20 | Discharge: 2020-05-16 | DRG: 057 | Disposition: A | Discharge status: Home Health | Payer: Medicaid Other | Source: Intra-hospital | Attending: Physical Medicine & Rehabilitation | Admitting: Physical Medicine & Rehabilitation

## 2020-04-20 ENCOUNTER — Encounter (HOSPITAL_COMMUNITY): Payer: Self-pay | Admitting: Physical Medicine & Rehabilitation

## 2020-04-20 DIAGNOSIS — F1010 Alcohol abuse, uncomplicated: Secondary | ICD-10-CM | POA: Diagnosis present

## 2020-04-20 DIAGNOSIS — F329 Major depressive disorder, single episode, unspecified: Secondary | ICD-10-CM | POA: Diagnosis present

## 2020-04-20 DIAGNOSIS — I69322 Dysarthria following cerebral infarction: Secondary | ICD-10-CM | POA: Diagnosis not present

## 2020-04-20 DIAGNOSIS — M48061 Spinal stenosis, lumbar region without neurogenic claudication: Secondary | ICD-10-CM | POA: Diagnosis present

## 2020-04-20 DIAGNOSIS — F1910 Other psychoactive substance abuse, uncomplicated: Secondary | ICD-10-CM | POA: Diagnosis present

## 2020-04-20 DIAGNOSIS — I639 Cerebral infarction, unspecified: Secondary | ICD-10-CM

## 2020-04-20 DIAGNOSIS — I672 Cerebral atherosclerosis: Secondary | ICD-10-CM | POA: Diagnosis present

## 2020-04-20 DIAGNOSIS — M109 Gout, unspecified: Secondary | ICD-10-CM | POA: Diagnosis present

## 2020-04-20 DIAGNOSIS — E1142 Type 2 diabetes mellitus with diabetic polyneuropathy: Secondary | ICD-10-CM | POA: Diagnosis present

## 2020-04-20 DIAGNOSIS — I7389 Other specified peripheral vascular diseases: Secondary | ICD-10-CM | POA: Diagnosis present

## 2020-04-20 DIAGNOSIS — Z8249 Family history of ischemic heart disease and other diseases of the circulatory system: Secondary | ICD-10-CM

## 2020-04-20 DIAGNOSIS — R062 Wheezing: Secondary | ICD-10-CM | POA: Diagnosis present

## 2020-04-20 DIAGNOSIS — Z7982 Long term (current) use of aspirin: Secondary | ICD-10-CM | POA: Diagnosis not present

## 2020-04-20 DIAGNOSIS — R0602 Shortness of breath: Secondary | ICD-10-CM | POA: Diagnosis present

## 2020-04-20 DIAGNOSIS — M5416 Radiculopathy, lumbar region: Secondary | ICD-10-CM | POA: Diagnosis not present

## 2020-04-20 DIAGNOSIS — E1159 Type 2 diabetes mellitus with other circulatory complications: Secondary | ICD-10-CM | POA: Diagnosis present

## 2020-04-20 DIAGNOSIS — I69351 Hemiplegia and hemiparesis following cerebral infarction affecting right dominant side: Principal | ICD-10-CM

## 2020-04-20 DIAGNOSIS — E119 Type 2 diabetes mellitus without complications: Secondary | ICD-10-CM

## 2020-04-20 DIAGNOSIS — E1169 Type 2 diabetes mellitus with other specified complication: Secondary | ICD-10-CM | POA: Diagnosis present

## 2020-04-20 DIAGNOSIS — I5032 Chronic diastolic (congestive) heart failure: Secondary | ICD-10-CM

## 2020-04-20 DIAGNOSIS — I6300 Cerebral infarction due to thrombosis of unspecified precerebral artery: Secondary | ICD-10-CM | POA: Diagnosis not present

## 2020-04-20 DIAGNOSIS — I69391 Dysphagia following cerebral infarction: Secondary | ICD-10-CM | POA: Diagnosis not present

## 2020-04-20 DIAGNOSIS — F17210 Nicotine dependence, cigarettes, uncomplicated: Secondary | ICD-10-CM | POA: Diagnosis present

## 2020-04-20 DIAGNOSIS — E785 Hyperlipidemia, unspecified: Secondary | ICD-10-CM | POA: Diagnosis present

## 2020-04-20 DIAGNOSIS — F250 Schizoaffective disorder, bipolar type: Secondary | ICD-10-CM | POA: Diagnosis present

## 2020-04-20 DIAGNOSIS — I6350 Cerebral infarction due to unspecified occlusion or stenosis of unspecified cerebral artery: Secondary | ICD-10-CM

## 2020-04-20 DIAGNOSIS — I152 Hypertension secondary to endocrine disorders: Secondary | ICD-10-CM | POA: Diagnosis present

## 2020-04-20 DIAGNOSIS — N189 Chronic kidney disease, unspecified: Secondary | ICD-10-CM | POA: Diagnosis present

## 2020-04-20 DIAGNOSIS — Z7984 Long term (current) use of oral hypoglycemic drugs: Secondary | ICD-10-CM

## 2020-04-20 DIAGNOSIS — Z7151 Drug abuse counseling and surveillance of drug abuser: Secondary | ICD-10-CM

## 2020-04-20 DIAGNOSIS — M79609 Pain in unspecified limb: Secondary | ICD-10-CM | POA: Diagnosis not present

## 2020-04-20 DIAGNOSIS — Z7902 Long term (current) use of antithrombotics/antiplatelets: Secondary | ICD-10-CM

## 2020-04-20 DIAGNOSIS — I10 Essential (primary) hypertension: Secondary | ICD-10-CM

## 2020-04-20 DIAGNOSIS — Z79899 Other long term (current) drug therapy: Secondary | ICD-10-CM | POA: Diagnosis not present

## 2020-04-20 DIAGNOSIS — I69392 Facial weakness following cerebral infarction: Secondary | ICD-10-CM | POA: Diagnosis not present

## 2020-04-20 DIAGNOSIS — N179 Acute kidney failure, unspecified: Secondary | ICD-10-CM | POA: Diagnosis not present

## 2020-04-20 DIAGNOSIS — M25559 Pain in unspecified hip: Secondary | ICD-10-CM

## 2020-04-20 DIAGNOSIS — R1310 Dysphagia, unspecified: Secondary | ICD-10-CM | POA: Diagnosis present

## 2020-04-20 DIAGNOSIS — E0822 Diabetes mellitus due to underlying condition with diabetic chronic kidney disease: Secondary | ICD-10-CM | POA: Diagnosis not present

## 2020-04-20 DIAGNOSIS — I5042 Chronic combined systolic (congestive) and diastolic (congestive) heart failure: Secondary | ICD-10-CM | POA: Diagnosis present

## 2020-04-20 DIAGNOSIS — M5432 Sciatica, left side: Secondary | ICD-10-CM | POA: Diagnosis not present

## 2020-04-20 LAB — GLUCOSE, CAPILLARY
Glucose-Capillary: 148 mg/dL — ABNORMAL HIGH (ref 70–99)
Glucose-Capillary: 180 mg/dL — ABNORMAL HIGH (ref 70–99)
Glucose-Capillary: 184 mg/dL — ABNORMAL HIGH (ref 70–99)
Glucose-Capillary: 191 mg/dL — ABNORMAL HIGH (ref 70–99)

## 2020-04-20 MED ORDER — ACETAMINOPHEN 160 MG/5ML PO SOLN
650.0000 mg | ORAL | Status: DC | PRN
  Filled 2020-04-20: qty 20.3

## 2020-04-20 MED ORDER — ASPIRIN EC 325 MG PO TBEC
325.0000 mg | DELAYED_RELEASE_TABLET | Freq: Every day | ORAL | Status: DC
  Administered 2020-04-21 – 2020-05-16 (×26): 325 mg via ORAL
  Filled 2020-04-20 (×26): qty 1

## 2020-04-20 MED ORDER — ENOXAPARIN SODIUM 40 MG/0.4ML ~~LOC~~ SOLN
40.0000 mg | SUBCUTANEOUS | Status: DC
  Administered 2020-04-21 – 2020-05-16 (×26): 40 mg via SUBCUTANEOUS
  Filled 2020-04-20 (×27): qty 0.4

## 2020-04-20 MED ORDER — DICLOFENAC SODIUM 1 % EX GEL
2.0000 g | Freq: Four times a day (QID) | CUTANEOUS | Status: DC
  Administered 2020-04-20 – 2020-05-15 (×98): 2 g via TOPICAL
  Filled 2020-04-20 (×2): qty 100

## 2020-04-20 MED ORDER — ALBUTEROL SULFATE (2.5 MG/3ML) 0.083% IN NEBU
2.5000 mg | INHALATION_SOLUTION | RESPIRATORY_TRACT | Status: DC | PRN
  Administered 2020-04-20: 2.5 mg via RESPIRATORY_TRACT
  Filled 2020-04-20: qty 3

## 2020-04-20 MED ORDER — PALIPERIDONE ER 3 MG PO TB24
3.0000 mg | ORAL_TABLET | Freq: Every day | ORAL | Status: DC
  Administered 2020-04-21 – 2020-05-16 (×26): 3 mg via ORAL
  Filled 2020-04-20 (×26): qty 1

## 2020-04-20 MED ORDER — ACETAMINOPHEN 650 MG RE SUPP: 650.0000 mg | RECTAL | Status: DC | PRN

## 2020-04-20 MED ORDER — SORBITOL 70 % SOLN
30.0000 mL | Freq: Every day | Status: DC | PRN
  Administered 2020-04-21 – 2020-05-15 (×5): 30 mL via ORAL
  Filled 2020-04-20 (×5): qty 30

## 2020-04-20 MED ORDER — ATORVASTATIN CALCIUM 40 MG PO TABS
40.0000 mg | ORAL_TABLET | Freq: Every day | ORAL | Status: DC
  Administered 2020-04-21 – 2020-05-16 (×26): 40 mg via ORAL
  Filled 2020-04-20 (×26): qty 1

## 2020-04-20 MED ORDER — GABAPENTIN 300 MG PO CAPS
300.0000 mg | ORAL_CAPSULE | Freq: Three times a day (TID) | ORAL | Status: DC
  Administered 2020-04-20 – 2020-04-24 (×14): 300 mg via ORAL
  Filled 2020-04-20 (×15): qty 1

## 2020-04-20 MED ORDER — DULOXETINE HCL 30 MG PO CPEP
30.0000 mg | ORAL_CAPSULE | Freq: Two times a day (BID) | ORAL | Status: DC
  Administered 2020-04-20 – 2020-04-22 (×4): 30 mg via ORAL
  Filled 2020-04-20 (×6): qty 1

## 2020-04-20 MED ORDER — ACETAMINOPHEN 325 MG PO TABS
650.0000 mg | ORAL_TABLET | ORAL | Status: DC | PRN
  Administered 2020-04-21 – 2020-05-15 (×48): 650 mg via ORAL
  Filled 2020-04-20 (×48): qty 2

## 2020-04-20 MED ORDER — ATORVASTATIN CALCIUM 40 MG PO TABS: 40.0000 mg | ORAL_TABLET | Freq: Every day | ORAL | Status: DC

## 2020-04-20 MED ORDER — CLOPIDOGREL BISULFATE 75 MG PO TABS
75.0000 mg | ORAL_TABLET | Freq: Every day | ORAL | Status: DC
  Administered 2020-04-21 – 2020-05-16 (×26): 75 mg via ORAL
  Filled 2020-04-20 (×26): qty 1

## 2020-04-20 MED ORDER — INSULIN ASPART 100 UNIT/ML ~~LOC~~ SOLN
0.0000 [IU] | Freq: Three times a day (TID) | SUBCUTANEOUS | Status: DC
  Administered 2020-04-20: 2 [IU] via SUBCUTANEOUS
  Administered 2020-04-21: 1 [IU] via SUBCUTANEOUS
  Administered 2020-04-21: 2 [IU] via SUBCUTANEOUS
  Administered 2020-04-21 – 2020-04-22 (×2): 1 [IU] via SUBCUTANEOUS
  Administered 2020-04-22 – 2020-04-23 (×2): 2 [IU] via SUBCUTANEOUS
  Administered 2020-04-23 (×2): 1 [IU] via SUBCUTANEOUS
  Administered 2020-04-24: 2 [IU] via SUBCUTANEOUS
  Administered 2020-04-24 – 2020-04-25 (×3): 1 [IU] via SUBCUTANEOUS
  Administered 2020-04-25: 2 [IU] via SUBCUTANEOUS
  Administered 2020-04-25: 1 [IU] via SUBCUTANEOUS
  Administered 2020-04-26: 2 [IU] via SUBCUTANEOUS
  Administered 2020-04-26: 1 [IU] via SUBCUTANEOUS
  Administered 2020-04-26: 2 [IU] via SUBCUTANEOUS
  Administered 2020-04-27 (×2): 1 [IU] via SUBCUTANEOUS
  Administered 2020-04-27: 8 [IU] via SUBCUTANEOUS
  Administered 2020-04-28 (×2): 2 [IU] via SUBCUTANEOUS
  Administered 2020-04-28: 1 [IU] via SUBCUTANEOUS
  Administered 2020-04-29: 2 [IU] via SUBCUTANEOUS
  Administered 2020-04-29 – 2020-04-30 (×3): 1 [IU] via SUBCUTANEOUS
  Administered 2020-04-30 – 2020-05-01 (×2): 2 [IU] via SUBCUTANEOUS
  Administered 2020-05-01 – 2020-05-02 (×2): 1 [IU] via SUBCUTANEOUS
  Administered 2020-05-02: 2 [IU] via SUBCUTANEOUS
  Administered 2020-05-02 – 2020-05-10 (×18): 1 [IU] via SUBCUTANEOUS
  Administered 2020-05-10: 2 [IU] via SUBCUTANEOUS
  Administered 2020-05-11: 3 [IU] via SUBCUTANEOUS
  Administered 2020-05-11: 2 [IU] via SUBCUTANEOUS
  Administered 2020-05-11 – 2020-05-12 (×2): 3 [IU] via SUBCUTANEOUS
  Administered 2020-05-12: 5 [IU] via SUBCUTANEOUS
  Administered 2020-05-12 – 2020-05-13 (×4): 3 [IU] via SUBCUTANEOUS
  Administered 2020-05-14 (×2): 2 [IU] via SUBCUTANEOUS
  Administered 2020-05-14 – 2020-05-15 (×2): 3 [IU] via SUBCUTANEOUS
  Administered 2020-05-15 – 2020-05-16 (×3): 2 [IU] via SUBCUTANEOUS

## 2020-04-20 MED ORDER — ENOXAPARIN SODIUM 40 MG/0.4ML ~~LOC~~ SOLN: 40.0000 mg | SUBCUTANEOUS | Status: DC

## 2020-04-20 MED ORDER — SENNOSIDES-DOCUSATE SODIUM 8.6-50 MG PO TABS: 1.0000 | ORAL_TABLET | Freq: Every evening | ORAL | Status: DC | PRN

## 2020-04-20 NOTE — H&P (Signed)
Physical Medicine and Rehabilitation Admission H&P    No chief complaint on file. 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  Patient lives with children 1 level home 3 steps to entry.  Ambulated modified independent with a rolling walker prior to admission.  Family assistance as needed.  Presented 04/17/2020 with increasing right side weakness and dysarthria.  MRI identified small acute infarct of the left pons.  No hemorrhage or mass-effect.  Moderate chronic ischemic microangiopathy.  Patient did not receive TPA.  MRA without emergent large vessel occlusion.  CT angiogram head and neck advanced intracranial atherosclerosis with high-grade narrowing at the supraclinoid right ICA, left A1 origin, bilateral pericallosal arteries and bilateral PCA branches.  Echocardiogram with ejection fraction of 50%.  The left ventricle demonstrated global hypokinesis.  Admission chemistries BUN 23 creatinine 1.40 urine drug screen negative as well as alcohol negative.  Neurology follow-up maintained on aspirin 325 mg and Plavix for CVA prophylaxis x3 months then Plavix alone.  Subcutaneous Lovenox for DVT prophylaxis.  Currently on a dysphagia #2 thin liquid diet.  Therapy evaluations completed and patient was admitted for a comprehensive rehab program  Per d/w admissions coordinator, pt was asking to speak with  neuropsych regarding drug use.  Pt reports L side, esp L thigh hurts with walking.  Last 2 days, pt c/o SOB, wheezing.     Review of Systems  Constitutional: Negative for chills and fever.  HENT: Negative for hearing loss.   Eyes: Negative for blurred vision and double  vision.  Respiratory: Positive for shortness of breath and wheezing. Negative for cough.   Cardiovascular: Negative for chest pain, palpitations and leg swelling.  Gastrointestinal: Positive for constipation. Negative for heartburn and nausea.  Genitourinary: Negative for flank pain and hematuria.  Musculoskeletal: Positive for myalgias.  Skin: Negative for rash.  Neurological: Positive for speech change, focal weakness and weakness.  Psychiatric/Behavioral: Positive for depression. The patient has insomnia.   All other systems reviewed and are negative.  Past Medical History:  Diagnosis Date  . Alcohol abuse   . Arthritis   . Crack cocaine use   . Depression   . Diabetes mellitus   . Drug abuse ([place])   . DTs (delirium tremens) ([place])    history of   . Gout   . Noncompliance with medication regimen   . Schizophrenia ([place])   . Stroke ([place])   . Systolic heart failure secondary to hypertension ([place])   . Uncontrolled hypertension    Past Surgical History:  Procedure Laterality Date  . LEFT HEART CATH AND CORONARY ANGIOGRAPHY N/A 02/03/2018   Procedure: LEFT HEART CATH AND CORONARY ANGIOGRAPHY;  Surgeon: Jettie Booze, MD;  Location: [facility];  Service: Cardiovascular;  Laterality: N/A;  . MANDIBLE RECONSTRUCTION    . TONSILLECTOMY     Family History  Problem Relation Age of Onset  . Hypertension Other    Social History:  reports that he has been smoking cigarettes. He has been smoking about 0.25 packs per day. He has never used smokeless tobacco. He reports current alcohol use of about 1.0 - 2.0 standard drinks of alcohol per week. He reports current drug use. Drugs: "Crack" cocaine and Cocaine. Allergies: No Known  Allergies Medications Prior to Admission  Medication Sig Dispense Refill  . acetaminophen (TYLENOL) 325 MG tablet Take 2 tablets (650 mg total) by mouth every 6 (six) hours as needed for mild pain (or Fever >/= 101). 30 tablet 0  . aspirin EC 81 MG  tablet Take 81 mg by mouth daily.    Derrill Memo ON 04/21/2020] atorvastatin (LIPITOR) 40 MG tablet Take 1 tablet (40 mg total) by mouth daily.    . carvedilol (COREG) 25 MG tablet Take 1 tablet (25 mg total) by mouth 2 (two) times daily with a meal. 60 tablet 0  . clopidogrel (PLAVIX) 75 MG tablet Take 1 tablet (75 mg total) by mouth daily. (Patient not taking: Reported on 04/18/2020) 30 tablet 0  . diclofenac Sodium (VOLTAREN) 1 % GEL Apply 2 g topically 4 (four) times daily. 50 g 0  . DULoxetine (CYMBALTA) 30 MG capsule Take 1 capsule (30 mg total) by mouth 2 (two) times daily. (Patient not taking: Reported on 04/18/2020) 60 capsule 0  . ergocalciferol (VITAMIN D2) 1.25 MG (50000 UT) capsule Take 1 capsule by mouth once a week.    Marland Kitchen FLUoxetine (PROZAC) 10 MG capsule Take 10 mg by mouth daily.    . folic acid (FOLVITE) 1 MG tablet Take 1 mg by mouth daily.    Marland Kitchen gabapentin (NEURONTIN) 300 MG capsule Take 1 capsule (300 mg total) by mouth 3 (three) times daily. 90 capsule 0  . hydrOXYzine (ATARAX/VISTARIL) 25 MG tablet Take 25 mg by mouth 2 (two) times daily.    . metFORMIN (GLUCOPHAGE) 500 MG tablet Take 1 tablet (500 mg total) by mouth 2 (two) times daily with a meal. 60 tablet 0  . mirtazapine (REMERON) 7.5 MG tablet Take 7.5 mg by mouth at bedtime.    . Multiple Vitamin (DAILY-VITE) TABS Take 1 tablet by mouth daily.    . paliperidone (INVEGA) 3 MG 24 hr tablet Take 1 tablet (3 mg total) by mouth daily. (Patient taking differently: Take 6 mg by mouth daily. ) 30 tablet 0  . pantoprazole (PROTONIX) 20 MG tablet Take 20 mg by mouth daily.    . SENNA PLUS 8.6-50 MG tablet Take 1 tablet by mouth daily.    . traMADol (ULTRAM) 50 MG tablet Take 50 mg by mouth every 6 (six) hours as needed for moderate pain.       Drug Regimen Review Drug regimen was reviewed and remains appropriate with no significant issues identified  Home:     Functional History:    Functional Status:  Mobility:           ADL:    Cognition:      Physical Exam: There were no vitals taken for this visit. Physical Exam  Nursing note and vitals reviewed. Constitutional: He appears well-developed and well-nourished.  Pt is sitting up in bed- watching TV- just finished lunch, appropriate, very dysarthric- hard to understand, NAD  HENT:  Head: Normocephalic and atraumatic.  Nose: Nose normal.  Mouth/Throat: Oropharynx is clear and moist. No oropharyngeal exudate.  R facial droop R tongue deviation  Eyes: Conjunctivae are normal. Right eye exhibits no discharge. Left eye exhibits no discharge.  EOMI B/L - no nystagmus  Neck: No tracheal deviation present.  Cardiovascular:  RRR- rate 88 on monitor; BP 166/127 on monitor  Respiratory:  Wheezing audibly and on exam/auscultation- OK air movement  GI:  Soft, NT, ND, (+)BS    Genitourinary:    Genitourinary Comments: Condom cathter?  Musculoskeletal:     Cervical back: Normal range of motion and neck supple.     Comments: RUE- deltoid, biceps, triceps, WE grip 4-/5; finger abd 3/5 LUE- 5/5 in same muscles RLE- HF 3-/5, KE/KF 3/5, DF and PF 2/5 LLE- 5/5 in same muscles  Neurological: He is alert.  Patient is alert in no acute distress makes good eye contact with examiner.  Speech is dysarthric but intelligible.  Provides his name and age.  Follows simple commands. Sensation intact to light touch in all 4 extremities Very dysarthric- using gestures, "signs" and some speech to communicate  Skin:  IV R hand and L forearm- looks OK  Psychiatric: He has a normal mood and affect.    Results for orders placed or performed during the hospital encounter of 04/17/20 (from the past 48 hour(s))  Glucose, capillary     Status: Abnormal   Collection Time: 04/18/20  4:02 PM  Result Value Ref Range   Glucose-Capillary 128 (H) 70 - 99 mg/dL    Comment: Glucose reference range applies only to samples taken after fasting for at least 8 hours.  Glucose,  capillary     Status: Abnormal   Collection Time: 04/18/20  9:17 PM  Result Value Ref Range   Glucose-Capillary 178 (H) 70 - 99 mg/dL    Comment: Glucose reference range applies only to samples taken after fasting for at least 8 hours.  Glucose, capillary     Status: Abnormal   Collection Time: 04/19/20  7:49 AM  Result Value Ref Range   Glucose-Capillary 185 (H) 70 - 99 mg/dL    Comment: Glucose reference range applies only to samples taken after fasting for at least 8 hours.  Glucose, capillary     Status: Abnormal   Collection Time: 04/19/20 11:51 AM  Result Value Ref Range   Glucose-Capillary 152 (H) 70 - 99 mg/dL    Comment: Glucose reference range applies only to samples taken after fasting for at least 8 hours.  Glucose, capillary     Status: Abnormal   Collection Time: 04/19/20  3:43 PM  Result Value Ref Range   Glucose-Capillary 116 (H) 70 - 99 mg/dL    Comment: Glucose reference range applies only to samples taken after fasting for at least 8 hours.  Glucose, capillary     Status: Abnormal   Collection Time: 04/19/20  9:39 PM  Result Value Ref Range   Glucose-Capillary 163 (H) 70 - 99 mg/dL    Comment: Glucose reference range applies only to samples taken after fasting for at least 8 hours.  Glucose, capillary     Status: Abnormal   Collection Time: 04/20/20  7:48 AM  Result Value Ref Range   Glucose-Capillary 148 (H) 70 - 99 mg/dL    Comment: Glucose reference range applies only to samples taken after fasting for at least 8 hours.  Glucose, capillary     Status: Abnormal   Collection Time: 04/20/20 12:21 PM  Result Value Ref Range   Glucose-Capillary 180 (H) 70 - 99 mg/dL    Comment: Glucose reference range applies only to samples taken after fasting for at least 8 hours.   ECHOCARDIOGRAM COMPLETE  Result Date: 04/18/2020    ECHOCARDIOGRAM REPORT   Patient Name:   [name] Date of Exam: 04/18/2020 Medical Rec #:  [id number]            Height:       71.0 in  Accession #:    [id number]  Weight:       221.1 lb Date of Birth:  14-Jul-1961           BSA:          2.200 m Patient Age:    59 years             BP:           152/113 mmHg Patient Gender: M                    HR:           81 bpm. Exam Location:  Inpatient Procedure: 2D Echo, Cardiac Doppler and Color Doppler Indications:    Stroke 434.91 / I163.9  History:        Patient has prior history of Echocardiogram examinations, most                 recent 02/01/2018. CHF; Risk Factors:Hypertension and Diabetes.                 Alcohol abuse. Cocaine abuse.  Sonographer:    Jonelle Sidle Dance Referring Phys: [id number] [place]  1. Left ventricular ejection fraction, by estimation, is 45 to 50%. The left ventricle has mildly decreased function. The left ventricle demonstrates global hypokinesis. Left ventricular diastolic parameters are indeterminate.  2. Right ventricular systolic function is normal. The right ventricular size is normal.  3. The mitral valve is normal in structure. Trivial mitral valve regurgitation. No evidence of mitral stenosis.  4. Unable to determine valve morphology due to image quality. Aortic valve regurgitation is not visualized. No aortic stenosis is present.  5. Aortic dilatation noted. 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  6. The inferior vena cava is dilated in size with >50% respiratory variability, suggesting right atrial pressure of 8 mmHg. FINDINGS  Left Ventricle: Left ventricular ejection fraction, by estimation, is 45 to 50%. The left ventricle has mildly decreased function. The left ventricle demonstrates global hypokinesis. The left ventricular internal cavity size was normal in size. There is  borderline left ventricular hypertrophy. Left ventricular diastolic parameters are indeterminate. Right Ventricle: The right ventricular size is normal. No increase in right ventricular wall thickness. Right ventricular systolic function is  normal. Left Atrium: Left atrial size was normal in size. Right Atrium: Right atrial size was normal in size. Pericardium: There is no evidence of pericardial effusion. Mitral Valve: The mitral valve is normal in structure. Trivial mitral valve regurgitation. No evidence of mitral valve stenosis. Tricuspid Valve: The tricuspid valve is normal in structure. Tricuspid valve regurgitation is trivial. Aortic Valve: Unable to determine valve morphology due to image quality. Aortic valve regurgitation is not visualized. No aortic stenosis is present. Pulmonic Valve: The pulmonic valve was grossly normal. Pulmonic valve regurgitation is not visualized. No evidence of pulmonic stenosis. Aorta: Aortic dilatation noted. 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 Venous: The inferior vena cava is dilated in size with greater than 50% respiratory variability, suggesting right atrial pressure of 8 mmHg. IAS/Shunts: No atrial level shunt detected by color flow Doppler.  LEFT VENTRICLE PLAX 2D LVIDd:         5.40 cm  Diastology LVIDs:         4.10 cm  LV e' lateral:   8.38 cm/s LV PW:         1.00 cm  LV E/e' lateral: 10.8 LV IVS:  1.30 cm  LV e' medial:    5.22 cm/s LVOT diam:     2.30 cm  LV E/e' medial:  17.3 LV SV:         74 LV SV Index:   33 LVOT Area:     4.15 cm  RIGHT VENTRICLE             IVC RV Basal diam:  2.30 cm     IVC diam: 2.20 cm RV S prime:     14.50 cm/s TAPSE (M-mode): 1.8 cm LEFT ATRIUM             Index LA diam:        3.60 cm 1.64 cm/m LA Vol (A2C):   63.9 ml 29.04 ml/m LA Vol (A4C):   40.7 ml 18.50 ml/m LA Biplane Vol: 52.8 ml 23.99 ml/m  AORTIC VALVE LVOT Vmax:   90.20 cm/s LVOT Vmean:  59.700 cm/s LVOT VTI:    0.177 m  AORTA Ao Root diam: 4.10 cm Ao Asc diam:  4.00 cm MITRAL VALVE MV Area (PHT): 5.01 cm    SHUNTS MV Decel Time: 152 msec    Systemic VTI:  0.18 m MV E velocity: 90.50 cm/s  Systemic Diam: 2.30 cm Buford Dresser MD Electronically signed by Buford Dresser MD Signature Date/Time: 04/18/2020/10:21:13 PM    Final        Medical Problem List and Plan: 1.  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  Recommendations a 30-day cardiac event monitor as outpatient  -patient may shower  -ELOS/Goals: 2-3 weeks- mod I to supervision 2.  Antithrombotics: -DVT/anticoagulation: Lovenox  -antiplatelet therapy: Aspirin 325 mg and Plavix 75 mg daily x3 months then Plavix alone 3. Pain Management: Voltaren gel 4 times daily, Neurontin 300 mg 3 times daily 4. Mood: Cymbalta 30 mg twice daily  -antipsychotic agents: Invega 3 mg daily 5. Neuropsych: This patient is capable of making decisions on his own behalf. 6. Skin/Wound Care: Routine skin checks 7. Fluids/Electrolytes/Nutrition: Routine in and outs with follow-up chemistries 8.  Diastolic congestive heart failure.  Monitor for any signs of fluid overload 9.  Diabetes mellitus with peripheral neuropathy.  Hemoglobin A1c 7.7.  SSI.  Patient on Glucophage 500 mg twice daily prior to admission.  Resume as needed 10.  Permissive hypertension.  Patient on Coreg 25 mg twice daily, Norvasc 10 mg daily, hydralazine 25 mg 3 times daily, Cozaar 50 mg daily prior to admission.  Resume as needed 11.  Hyperlipidemia.  Lipitor 12.  History of polysubstance tobacco and alcohol abuse.  Urine drug screen negative.  Provide counseling 13. Wheezing/SOB- asked primary team to give pt Albuterol breathing treatment- was ordered and given prior to admission to [facility]- suggest to be added to medication list prn.     Lavon Paganini. [place], PA-C 04/20/2020   I have personally performed a face to face diagnostic evaluation of this patient and formulated the key components of the plan.  Additionally, I have personally reviewed laboratory data, imaging studies, as well as relevant  notes and concur with the physician assistant's documentation above.   The patient's status has not changed from the original H&P.  Any changes in documentation from the acute care chart have been noted above.    Courtney Heys, MD 04/20/2020

## 2020-04-20 NOTE — Progress Notes (Signed)
Report received from [place] on 5W. Patient is alert and oriented X4 with slurred speech and right side weakness due to recent CVA. Patient had a MBS and is on thin liquids with dys 1 diet and crush meds in applesauce. BP does run high with PRN meds given as needed. Patient has 2 IV's and no skin issues. Patient is at least a 2 assist with walker and has pain at times to the left hip.

## 2020-04-20 NOTE — Progress Notes (Deleted)
PATIENT DETAILS Name: [name] Age: 59 y.o. Sex: male Date of Birth: 03-08-1961 MRN: [id number]. Admitting Physician: Jonetta Osgood, MD NTZ:GYFV, Coolidge Breeze, FNP  Admit Date: 04/17/2020 Discharge date: 04/20/2020  Recommendations for Outpatient Follow-up:  1. Follow up with PCP in 1-2 weeks 2. Please obtain CMP/CBC in one week 3. Aspirin/Plavix for 3 months followed by Plavix alone 4. Please ensure follow-up at stroke clinic. 5. Initial antihypertensives were held and permissive hypertension was allowed-resuming Coreg-please resume amlodipine, hydralazine, losartan over the next few days.  Admitted From:  Home  Disposition: [place]: No  Equipment/Devices: None  Discharge Condition: Stable  CODE STATUS: FULL CODE  Diet recommendation:  Diet Order            Diet - low sodium heart healthy        Diet Carb Modified        DIET DYS 2 Room service appropriate? Yes with Assist; Fluid consistency: Thin  Diet effective now               Brief Narrative: Patient is a 59 y.o. male with prior history of CVA with chronic residual right-sided weakness, chronic combined systolic/diastolic heart failure, DM-2, HTN, HLD, schizoaffective disorder who presented with worsening right-sided weakness and dysarthria.  He was subsequently admitted for further evaluation and treatment.  Significant events: 5/31>> admit for evaluation of worsening right-sided weakness and dysarthria (more than usual baseline)  Significant studies: 6/1>> CT angio head/neck: Advanced intracranial atherosclerosis with high-grade narrowings at the supraclinoid right ICA, left A1 origin, bilateral pericallosal arteries, bilateral PCA branches, cervical carotid atherosclerosis without flow-limiting stenosis. 6/1>> MRI brain: Small infarct left pons, punctate focus of DWI intensity in the right frontal white matter. 6/1>> Echo: EF 45-50% 5/31>> CT head: No acute intracranial  abnormality 5/31>> chest x-ray: No active disease.  Antimicrobial therapy: None  Microbiology data: None  Procedures : None  Consults: Neurology  Brief Hospital Course: Acute CVA: Likely secondary to small vessel disease.  Continues to have some dysarthria and right-sided weakness more than usual baseline.-not sure if this is his baseline.  Imaging studies as noted above.  A1c 7.7, LDL 114-echo with slightly reduced EF but without any embolic source.  Continue dual antiplatelet agents with aspirin/Plavix for 3 months, then Plavix alone.  Continue statin.    AKI: Appears to be mild-likely hemodynamically mediated-resolved.  Repeat electrolytes periodically.  Hypertension:allow permissive HTN-given acute CVA.  Continue to hold all antihypertensive agents.  Chronic combined systolic and diastolic CHF (EF 49-44% by TTE 02/01/2018): Euvolemic on exam-follow closely.   DM-2 (A1c 7.7 on 6/1): CBGs stable on SSI-should be okay to resume Metformin on discharge.   Hyperlipidemia: Continue statin  Schizoaffective disorder: Stable-continue Invega and Cymbalta  History of polysubstance use disorder:Patient denies any recent illicit drug, alcohol, or tobacco use. UDS and EtOH levels are negative  Obesity: Estimated body mass index is 30.84 kg/m as calculated from the following:   Height as of this encounter: 5\' 11"  (1.803 m).   Weight as of this encounter: 100.3 kg.    Discharge Diagnoses:  Principal Problem:   Right sided weakness Active Problems:   Diabetes mellitus ([place])   Hypertension associated with diabetes ([place])   Hyperlipidemia associated with type 2 diabetes mellitus (HCC)   Schizoaffective disorder, bipolar type ([place])   AKI (acute kidney injury) ([place])   History of CVA with residual deficit   Chronic combined systolic (congestive) and diastolic (congestive) heart failure ([place])  Substance use disorder   CVA (cerebral vascular accident) [facility])   Discharge  Instructions:  Activity:  As tolerated with Full fall precautions use walker/cane & assistance as needed   Discharge Instructions    Ambulatory referral to Neurology   Complete by: As directed    Follow up with stroke clinic NP (Jessica Vanschaick or Cecille Rubin, if both not available, consider Zachery Dauer, or [place]) at [facility] in about 4 weeks. Thanks.   Diet - low sodium heart healthy   Complete by: As directed    Diet Carb Modified   Complete by: As directed    Discharge instructions   Complete by: As directed    Follow with Primary MD  Boyce Medici, FNP in 1-2 weeks  Follow with stroke clinic in 1 week  Take aspirin along with Plavix for 3 months, then after 3 months stop aspirin and continue on Plavix  Please get a complete blood count and chemistry panel checked by your Primary MD at your next visit, and again as instructed by your Primary MD.  Get Medicines reviewed and adjusted: Please take all your medications with you for your next visit with your Primary MD  Laboratory/radiological data: Please request your Primary MD to go over all hospital tests and procedure/radiological results at the follow up, please ask your Primary MD to get all Hospital records sent to his/her office.  In some cases, they will be blood work, cultures and biopsy results pending at the time of your discharge. Please request that your primary care M.D. follows up on these results.  Also Note the following: If you experience worsening of your admission symptoms, develop shortness of breath, life threatening emergency, suicidal or homicidal thoughts you must seek medical attention immediately by calling 911 or calling your MD immediately  if symptoms less severe.  You must read complete instructions/literature along with all the possible adverse reactions/side effects for all the Medicines you take and that have been prescribed to you. Take any new Medicines after you have completely understood  and accpet all the possible adverse reactions/side effects.   Do not drive when taking Pain medications or sleeping medications (Benzodaizepines)  Do not take more than prescribed Pain, Sleep and Anxiety Medications. It is not advisable to combine anxiety,sleep and pain medications without talking with your primary care practitioner  Special Instructions: If you have smoked or chewed Tobacco  in the last 2 yrs please stop smoking, stop any regular Alcohol  and or any Recreational drug use.  Wear Seat belts while driving.  Please note: You were cared for by a hospitalist during your hospital stay. Once you are discharged, your primary care physician will handle any further medical issues. Please note that NO REFILLS for any discharge medications will be authorized once you are discharged, as it is imperative that you return to your primary care physician (or establish a relationship with a primary care physician if you do not have one) for your post hospital discharge needs so that they can reassess your need for medications and monitor your lab values.   Increase activity slowly   Complete by: As directed      Allergies as of 04/20/2020   No Known Allergies     Medication List    STOP taking these medications   amLODipine 10 MG tablet Commonly known as: NORVASC   hydrALAZINE 25 MG tablet Commonly known as: APRESOLINE   losartan 50 MG tablet Commonly known as: COZAAR   ondansetron 4  MG tablet Commonly known as: ZOFRAN     TAKE these medications   acetaminophen 325 MG tablet Commonly known as: TYLENOL Take 2 tablets (650 mg total) by mouth every 6 (six) hours as needed for mild pain (or Fever >/= 101).   aspirin EC 81 MG tablet Take 81 mg by mouth daily.   atorvastatin 40 MG tablet Commonly known as: LIPITOR Take 1 tablet (40 mg total) by mouth daily. Start taking on: April 21, 2020   carvedilol 25 MG tablet Commonly known as: COREG Take 1 tablet (25 mg total) by mouth 2  (two) times daily with a meal.   clopidogrel 75 MG tablet Commonly known as: PLAVIX Take 1 tablet (75 mg total) by mouth daily.   Daily-Vite Tabs Take 1 tablet by mouth daily.   diclofenac Sodium 1 % Gel Commonly known as: VOLTAREN Apply 2 g topically 4 (four) times daily.   DULoxetine 30 MG capsule Commonly known as: CYMBALTA Take 1 capsule (30 mg total) by mouth 2 (two) times daily.   ergocalciferol 1.25 MG (50000 UT) capsule Commonly known as: VITAMIN D2 Take 1 capsule by mouth once a week.   FLUoxetine 10 MG capsule Commonly known as: PROZAC Take 10 mg by mouth daily.   folic acid 1 MG tablet Commonly known as: FOLVITE Take 1 mg by mouth daily.   gabapentin 300 MG capsule Commonly known as: NEURONTIN Take 1 capsule (300 mg total) by mouth 3 (three) times daily.   hydrOXYzine 25 MG tablet Commonly known as: ATARAX/VISTARIL Take 25 mg by mouth 2 (two) times daily.   metFORMIN 500 MG tablet Commonly known as: GLUCOPHAGE Take 1 tablet (500 mg total) by mouth 2 (two) times daily with a meal.   mirtazapine 7.5 MG tablet Commonly known as: REMERON Take 7.5 mg by mouth at bedtime.   paliperidone 3 MG 24 hr tablet Commonly known as: INVEGA Take 1 tablet (3 mg total) by mouth daily. What changed: how much to take   pantoprazole 20 MG tablet Commonly known as: PROTONIX Take 20 mg by mouth daily.   Senna Plus 8.6-50 MG tablet Generic drug: senna-docusate Take 1 tablet by mouth daily.   traMADol 50 MG tablet Commonly known as: ULTRAM Take 50 mg by mouth every 6 (six) hours as needed for moderate pain.      Follow-up Information    [facility]. Schedule an appointment as soon as possible for a visit in 4 week(s).   Specialty: Neurology Contact information: [address] [place] [phone]       Boyce Medici, FNP. Schedule an appointment as soon as possible for a visit in 1 week(s).   Specialty:  Nurse Practitioner Contact information: [place] [address] [phone]          No Known Allergies    Other Procedures/Studies: CT ANGIO HEAD W OR WO CONTRAST  Result Date: 04/18/2020 CLINICAL DATA:  Stroke follow-up EXAM: CT ANGIOGRAPHY HEAD AND NECK TECHNIQUE: Multidetector CT imaging of the head and neck was performed using the standard protocol during bolus administration of intravenous contrast. Multiplanar CT image reconstructions and MIPs were obtained to evaluate the vascular anatomy. Carotid stenosis measurements (when applicable) are obtained utilizing NASCET criteria, using the distal internal carotid diameter as the denominator. CONTRAST:  63mL OMNIPAQUE IOHEXOL 350 MG/ML SOLN COMPARISON:  Brain MRI and MRA from earlier today FINDINGS: CTA NECK FINDINGS Aortic arch: Atheromatous wall thickening.  No acute finding.  Right carotid system: Tortuous with partial retropharyngeal course. Mild atheromatous changes at the bifurcation and distal common carotid. No flow limiting stenosis or ulceration. Left carotid system: Mild atheromatous plaque at the proximal common carotid. No stenosis or ulceration. Vertebral arteries: No proximal subclavian stenosis. The codominant vertebral arteries are smooth and widely patent to the dura. Skeleton: Advanced mid cervical disc degeneration with disc space collapse and endplate sclerosis. Other neck: No acute finding. Upper chest: No acute finding Review of the MIP images confirms the above findings CTA HEAD FINDINGS Anterior circulation: Atheromatous plaque along the bilateral carotid siphons with 45% left periophthalmic ICA narrowing and 75% narrowing at the supraclinoid right ICA. Severe bilateral pericallosal stenosis with areas of imperceptible luminal enhancement. Severe left A1 origin stenosis. Accounting for atherosclerotic irregularity no aneurysm is seen. Posterior circulation: The vertebral and basilar arteries are smooth and  widely patent. Advanced atheromatous type narrowing of bilateral P4 branches. Venous sinuses: Diffusely patent Anatomic variants: None significant Review of the MIP images confirms the above findings IMPRESSION: 1. Advanced intracranial atherosclerosis with high-grade narrowings at the supraclinoid right ICA, left A1 origin, bilateral pericallosal arteries, and bilateral PCA branches. 2. Cervical carotid atherosclerosis without flow limiting stenosis in the neck. Electronically Signed   By: Monte Fantasia M.D.   On: 04/18/2020 08:14   DG Chest 1 View  Result Date: 04/17/2020 CLINICAL DATA:  59 year old male with cough and chest pain. EXAM: CHEST  1 VIEW COMPARISON:  Chest radiograph dated 02/07/2020. FINDINGS: The heart size and mediastinal contours are within normal limits. Both lungs are clear. The visualized skeletal structures are unremarkable. IMPRESSION: No active disease. Electronically Signed   By: Anner Crete M.D.   On: 04/17/2020 22:36   CT ANGIO NECK W OR WO CONTRAST  Result Date: 04/18/2020 CLINICAL DATA:  Stroke follow-up EXAM: CT ANGIOGRAPHY HEAD AND NECK TECHNIQUE: Multidetector CT imaging of the head and neck was performed using the standard protocol during bolus administration of intravenous contrast. Multiplanar CT image reconstructions and MIPs were obtained to evaluate the vascular anatomy. Carotid stenosis measurements (when applicable) are obtained utilizing NASCET criteria, using the distal internal carotid diameter as the denominator. CONTRAST:  21mL OMNIPAQUE IOHEXOL 350 MG/ML SOLN COMPARISON:  Brain MRI and MRA from earlier today FINDINGS: CTA NECK FINDINGS Aortic arch: Atheromatous wall thickening.  No acute finding. Right carotid system: Tortuous with partial retropharyngeal course. Mild atheromatous changes at the bifurcation and distal common carotid. No flow limiting stenosis or ulceration. Left carotid system: Mild atheromatous plaque at the proximal common carotid. No  stenosis or ulceration. Vertebral arteries: No proximal subclavian stenosis. The codominant vertebral arteries are smooth and widely patent to the dura. Skeleton: Advanced mid cervical disc degeneration with disc space collapse and endplate sclerosis. Other neck: No acute finding. Upper chest: No acute finding Review of the MIP images confirms the above findings CTA HEAD FINDINGS Anterior circulation: Atheromatous plaque along the bilateral carotid siphons with 45% left periophthalmic ICA narrowing and 75% narrowing at the supraclinoid right ICA. Severe bilateral pericallosal stenosis with areas of imperceptible luminal enhancement. Severe left A1 origin stenosis. Accounting for atherosclerotic irregularity no aneurysm is seen. Posterior circulation: The vertebral and basilar arteries are smooth and widely patent. Advanced atheromatous type narrowing of bilateral P4 branches. Venous sinuses: Diffusely patent Anatomic variants: None significant Review of the MIP images confirms the above findings IMPRESSION: 1. Advanced intracranial atherosclerosis with high-grade narrowings at the supraclinoid right ICA, left A1 origin, bilateral pericallosal arteries, and bilateral PCA branches.  2. Cervical carotid atherosclerosis without flow limiting stenosis in the neck. Electronically Signed   By: Monte Fantasia M.D.   On: 04/18/2020 08:14   MR ANGIO HEAD WO CONTRAST  Result Date: 04/18/2020 CLINICAL DATA:  [place] infarct EXAM: MRA HEAD WITHOUT CONTRAST TECHNIQUE: Angiographic images of the Circle of Willis were obtained using MRA technique without intravenous contrast. COMPARISON:  Brain MRI 04/18/2020 FINDINGS: Examination is degraded mildly by motion. POSTERIOR CIRCULATION: --Basilar artery: Normal. --Superior cerebellar arteries: Normal. --Posterior cerebral arteries: Severe stenosis of the right PCA proximal P3 segment. Normal left PCA. ANTERIOR CIRCULATION: --Intracranial internal carotid arteries: Normal. --Anterior  cerebral arteries (ACA): Normal. Both A1 segments are present. Patent anterior communicating artery (a-comm). --Middle cerebral arteries (MCA): Normal. IMPRESSION: Motion degraded study without emergent large vessel occlusion. Electronically Signed   By: Ulyses Jarred M.D.   On: 04/18/2020 03:54   MR BRAIN WO CONTRAST  Result Date: 04/18/2020 CLINICAL DATA:  Stroke follow-up EXAM: MRI HEAD WITHOUT CONTRAST TECHNIQUE: Multiplanar, multiecho pulse sequences of the brain and surrounding structures were obtained without intravenous contrast. COMPARISON:  None. FINDINGS: Brain: There is a small acute infarct of the left pons. There is a punctate focus of DWI hyperintensity in the right frontal white matter without a clear correlate on ADC. Early confluent hyperintense T2-weighted signal of the periventricular and deep white matter, most commonly due to chronic ischemic microangiopathy. Old bilateral basal ganglia small vessel infarcts. Multifocal chronic microhemorrhage within the cerebellum and both cerebral hemispheres. There are 5-7 foci total. Normal midline structures. Vascular: Normal flow voids. Skull and upper cervical spine: Normal marrow signal. Sinuses/Orbits: Negative. Other: None. IMPRESSION: 1. Small acute infarct of the left pons. No hemorrhage or mass effect. 2. Punctate focus of DWI hyperintensity in the right frontal white matter without a clear correlate on ADC. This may indicate a small acute or subacute infarct. 3. Moderate chronic ischemic microangiopathy. 4. Multifocal chronic microhemorrhage within the cerebellum and both cerebral hemispheres. Electronically Signed   By: Ulyses Jarred M.D.   On: 04/18/2020 03:21   ECHOCARDIOGRAM COMPLETE  Result Date: 04/18/2020    ECHOCARDIOGRAM REPORT   Patient Name:   DAION GINSBERG Date of Exam: 04/18/2020 Medical Rec #:  [id number]            Height:       71.0 in Accession #:    [id number]           Weight:       221.1 lb Date of Birth:  03-20-1961            BSA:          2.200 m Patient Age:    71 years             BP:           152/113 mmHg Patient Gender: M                    HR:           81 bpm. Exam Location:  Inpatient Procedure: 2D Echo, Cardiac Doppler and Color Doppler Indications:    Stroke 434.91 / I163.9  History:        Patient has prior history of Echocardiogram examinations, most                 recent 02/01/2018. CHF; Risk Factors:Hypertension and Diabetes.                 Alcohol abuse. Cocaine  abuse.  Sonographer:    Tiffany Dance Referring Phys: [id number] [place]  1. Left ventricular ejection fraction, by estimation, is 45 to 50%. The left ventricle has mildly decreased function. The left ventricle demonstrates global hypokinesis. Left ventricular diastolic parameters are indeterminate.  2. Right ventricular systolic function is normal. The right ventricular size is normal.  3. The mitral valve is normal in structure. Trivial mitral valve regurgitation. No evidence of mitral stenosis.  4. Unable to determine valve morphology due to image quality. Aortic valve regurgitation is not visualized. No aortic stenosis is present.  5. Aortic dilatation noted. 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  6. The inferior vena cava is dilated in size with >50% respiratory variability, suggesting right atrial pressure of 8 mmHg. FINDINGS  Left Ventricle: Left ventricular ejection fraction, by estimation, is 45 to 50%. The left ventricle has mildly decreased function. The left ventricle demonstrates global hypokinesis. The left ventricular internal cavity size was normal in size. There is  borderline left ventricular hypertrophy. Left ventricular diastolic parameters are indeterminate. Right Ventricle: The right ventricular size is normal. No increase in right ventricular wall thickness. Right ventricular systolic function is normal. Left Atrium: Left atrial size was normal in size. Right Atrium: Right atrial size was  normal in size. Pericardium: There is no evidence of pericardial effusion. Mitral Valve: The mitral valve is normal in structure. Trivial mitral valve regurgitation. No evidence of mitral valve stenosis. Tricuspid Valve: The tricuspid valve is normal in structure. Tricuspid valve regurgitation is trivial. Aortic Valve: Unable to determine valve morphology due to image quality. Aortic valve regurgitation is not visualized. No aortic stenosis is present. Pulmonic Valve: The pulmonic valve was grossly normal. Pulmonic valve regurgitation is not visualized. No evidence of pulmonic stenosis. Aorta: Aortic dilatation noted. 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 Venous: The inferior vena cava is dilated in size with greater than 50% respiratory variability, suggesting right atrial pressure of 8 mmHg. IAS/Shunts: No atrial level shunt detected by color flow Doppler.  LEFT VENTRICLE PLAX 2D LVIDd:         5.40 cm  Diastology LVIDs:         4.10 cm  LV e' lateral:   8.38 cm/s LV PW:         1.00 cm  LV E/e' lateral: 10.8 LV IVS:        1.30 cm  LV e' medial:    5.22 cm/s LVOT diam:     2.30 cm  LV E/e' medial:  17.3 LV SV:         74 LV SV Index:   33 LVOT Area:     4.15 cm  RIGHT VENTRICLE             IVC RV Basal diam:  2.30 cm     IVC diam: 2.20 cm RV S prime:     14.50 cm/s TAPSE (M-mode): 1.8 cm LEFT ATRIUM             Index LA diam:        3.60 cm 1.64 cm/m LA Vol (A2C):   63.9 ml 29.04 ml/m LA Vol (A4C):   40.7 ml 18.50 ml/m LA Biplane Vol: 52.8 ml 23.99 ml/m  AORTIC VALVE LVOT Vmax:   90.20 cm/s LVOT Vmean:  59.700 cm/s LVOT VTI:    0.177 m  AORTA Ao Root diam: 4.10 cm Ao Asc diam:  4.00 cm MITRAL  VALVE MV Area (PHT): 5.01 cm    SHUNTS MV Decel Time: 152 msec    Systemic VTI:  0.18 m MV E velocity: 90.50 cm/s  Systemic Diam: 2.30 cm Buford Dresser MD Electronically signed by Buford Dresser MD Signature Date/Time: 04/18/2020/10:21:13 PM    Final    CT HEAD CODE STROKE WO  CONTRAST  Result Date: 04/17/2020 CLINICAL DATA:  Code stroke.  Right-sided facial droop EXAM: CT HEAD WITHOUT CONTRAST TECHNIQUE: Contiguous axial images were obtained from the base of the skull through the vertex without intravenous contrast. COMPARISON:  03/03/2018 FINDINGS: Brain: There is no mass, hemorrhage or extra-axial collection. The size and configuration of the ventricles and extra-axial CSF spaces are normal. There is hypoattenuation of the periventricular white matter, most commonly indicating chronic ischemic microangiopathy. There are small vessel infarcts of the left basal ganglia that are likely chronic. Vascular: No abnormal hyperdensity of the major intracranial arteries or dural venous sinuses. No intracranial atherosclerosis. Skull: The visualized skull base, calvarium and extracranial soft tissues are normal. Sinuses/Orbits: No fluid levels or advanced mucosal thickening of the visualized paranasal sinuses. No mastoid or middle ear effusion. The orbits are normal. ASPECTS [facility] Stroke Program Early CT Score) - Ganglionic level infarction (caudate, lentiform nuclei, internal capsule, insula, M1-M3 cortex): 7 - Supraganglionic infarction (M4-M6 cortex): 3 Total score (0-10 with 10 being normal): 10 IMPRESSION: 1. No acute intracranial abnormality. 2. ASPECTS is 10. 3. Chronic ischemic microangiopathy and small vessel infarcts of the left basal ganglia that are likely old. 4. These results were communicated to Dr. Amie Portland at 9:58 pm on 04/17/2020 by text page via the [facility] messaging system. Electronically Signed   By: Ulyses Jarred M.D.   On: 04/17/2020 21:58     TODAY-DAY OF DISCHARGE:  Subjective:   Esdras Delair today has no headache,no chest abdominal pain,no new weakness tingling or numbness, feels much better wants to go home today.   Objective:   Blood pressure (!) 166/103, pulse 70, temperature 98 F (36.7 C), temperature source Oral, resp. rate 15, height 5'  11" (1.803 m), weight 100 kg, SpO2 92 %.  Intake/Output Summary (Last 24 hours) at 04/20/2020 1027 Last data filed at 04/20/2020 0900 Gross per 24 hour  Intake 780 ml  Output 1675 ml  Net -895 ml   Filed Weights   04/17/20 2209 04/18/20 0141 04/20/20 0627  Weight: 101.1 kg 100.3 kg 100 kg    Exam: Awake Alert, Oriented *3, No new F.N deficits, Normal affect [place].AT,PERRAL Supple Neck,No JVD, No cervical lymphadenopathy appriciated.  Symmetrical Chest wall movement, Good air movement bilaterally, CTAB RRR,No Gallops,Rubs or new Murmurs, No Parasternal Heave +ve B.Sounds, Abd Soft, Non tender, No organomegaly appriciated, No rebound -guarding or rigidity. No Cyanosis, Clubbing or edema, No new Rash or bruise   PERTINENT RADIOLOGIC STUDIES: ECHOCARDIOGRAM COMPLETE  Result Date: 04/18/2020    ECHOCARDIOGRAM REPORT   Patient Name:   EUDELL MCPHEE Date of Exam: 04/18/2020 Medical Rec #:  [id number]            Height:       71.0 in Accession #:    [id number]           Weight:       221.1 lb Date of Birth:  Jun 25, 1961           BSA:          2.200 m Patient Age:    4 years  BP:           152/113 mmHg Patient Gender: M                    HR:           81 bpm. Exam Location:  Inpatient Procedure: 2D Echo, Cardiac Doppler and Color Doppler Indications:    Stroke 434.91 / I163.9  History:        Patient has prior history of Echocardiogram examinations, most                 recent 02/01/2018. CHF; Risk Factors:Hypertension and Diabetes.                 Alcohol abuse. Cocaine abuse.  Sonographer:    Jonelle Sidle Dance Referring Phys: [id number] [place]  1. Left ventricular ejection fraction, by estimation, is 45 to 50%. The left ventricle has mildly decreased function. The left ventricle demonstrates global hypokinesis. Left ventricular diastolic parameters are indeterminate.  2. Right ventricular systolic function is normal. The right ventricular size is normal.  3. The mitral valve  is normal in structure. Trivial mitral valve regurgitation. No evidence of mitral stenosis.  4. Unable to determine valve morphology due to image quality. Aortic valve regurgitation is not visualized. No aortic stenosis is present.  5. Aortic dilatation noted. 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  6. The inferior vena cava is dilated in size with >50% respiratory variability, suggesting right atrial pressure of 8 mmHg. FINDINGS  Left Ventricle: Left ventricular ejection fraction, by estimation, is 45 to 50%. The left ventricle has mildly decreased function. The left ventricle demonstrates global hypokinesis. The left ventricular internal cavity size was normal in size. There is  borderline left ventricular hypertrophy. Left ventricular diastolic parameters are indeterminate. Right Ventricle: The right ventricular size is normal. No increase in right ventricular wall thickness. Right ventricular systolic function is normal. Left Atrium: Left atrial size was normal in size. Right Atrium: Right atrial size was normal in size. Pericardium: There is no evidence of pericardial effusion. Mitral Valve: The mitral valve is normal in structure. Trivial mitral valve regurgitation. No evidence of mitral valve stenosis. Tricuspid Valve: The tricuspid valve is normal in structure. Tricuspid valve regurgitation is trivial. Aortic Valve: Unable to determine valve morphology due to image quality. Aortic valve regurgitation is not visualized. No aortic stenosis is present. Pulmonic Valve: The pulmonic valve was grossly normal. Pulmonic valve regurgitation is not visualized. No evidence of pulmonic stenosis. Aorta: Aortic dilatation noted. 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 Venous: The inferior vena cava is dilated in size with greater than 50% respiratory variability, suggesting right atrial pressure of 8 mmHg. IAS/Shunts: No atrial level shunt detected by color flow Doppler.   LEFT VENTRICLE PLAX 2D LVIDd:         5.40 cm  Diastology LVIDs:         4.10 cm  LV e' lateral:   8.38 cm/s LV PW:         1.00 cm  LV E/e' lateral: 10.8 LV IVS:        1.30 cm  LV e' medial:    5.22 cm/s LVOT diam:     2.30 cm  LV E/e' medial:  17.3 LV SV:         74 LV SV Index:   33 LVOT Area:     4.15 cm  RIGHT VENTRICLE  IVC RV Basal diam:  2.30 cm     IVC diam: 2.20 cm RV S prime:     14.50 cm/s TAPSE (M-mode): 1.8 cm LEFT ATRIUM             Index LA diam:        3.60 cm 1.64 cm/m LA Vol (A2C):   63.9 ml 29.04 ml/m LA Vol (A4C):   40.7 ml 18.50 ml/m LA Biplane Vol: 52.8 ml 23.99 ml/m  AORTIC VALVE LVOT Vmax:   90.20 cm/s LVOT Vmean:  59.700 cm/s LVOT VTI:    0.177 m  AORTA Ao Root diam: 4.10 cm Ao Asc diam:  4.00 cm MITRAL VALVE MV Area (PHT): 5.01 cm    SHUNTS MV Decel Time: 152 msec    Systemic VTI:  0.18 m MV E velocity: 90.50 cm/s  Systemic Diam: 2.30 cm Buford Dresser MD Electronically signed by Buford Dresser MD Signature Date/Time: 04/18/2020/10:21:13 PM    Final      PERTINENT LAB RESULTS: CBC: Recent Labs    04/17/20 2146 04/17/20 2150  WBC  --  7.7  HGB 12.9* 12.5*  HCT 38.0* 39.1  PLT  --  210   CMET CMP     Component Value Date/Time   NA 139 04/18/2020 0556   K 3.3 (L) 04/18/2020 0556   CL 101 04/18/2020 0556   CO2 25 04/18/2020 0556   GLUCOSE 167 (H) 04/18/2020 0556   BUN 15 04/18/2020 0556   CREATININE 1.19 04/18/2020 0556   CALCIUM 9.1 04/18/2020 0556   PROT 7.5 04/17/2020 2150   ALBUMIN 3.7 04/18/2020 0556   AST 21 04/17/2020 2150   ALT 11 04/17/2020 2150   ALKPHOS 75 04/17/2020 2150   BILITOT 0.8 04/17/2020 2150   GFRNONAA >60 04/18/2020 0556   GFRAA >60 04/18/2020 0556    GFR Estimated Creatinine Clearance: 82.5 mL/min (by C-G formula based on SCr of 1.19 mg/dL). No results for input(s): LIPASE, AMYLASE in the last 72 hours. No results for input(s): CKTOTAL, CKMB, CKMBINDEX, TROPONINI in the last 72 hours. Invalid  input(s): POCBNP No results for input(s): DDIMER in the last 72 hours. Recent Labs    04/18/20 0556  HGBA1C 7.7*   Recent Labs    04/18/20 0556  CHOL 195  HDL 44  LDLCALC 114*  TRIG 183*  CHOLHDL 4.4   No results for input(s): TSH, T4TOTAL, T3FREE, THYROIDAB in the last 72 hours.  Invalid input(s): FREET3 No results for input(s): VITAMINB12, FOLATE, FERRITIN, TIBC, IRON, RETICCTPCT in the last 72 hours. Coags: Recent Labs    04/17/20 2150  INR 0.9   Microbiology: Recent Results (from the past 240 hour(s))  SARS Coronavirus 2 by RT PCR (hospital order, performed in [facility] hospital lab) Nasopharyngeal Nasopharyngeal Swab     Status: None   Collection Time: 04/18/20 12:25 AM   Specimen: Nasopharyngeal Swab  Result Value Ref Range Status   SARS Coronavirus 2 NEGATIVE NEGATIVE Final    Comment: (NOTE) SARS-CoV-2 target nucleic acids are NOT DETECTED. The SARS-CoV-2 RNA is generally detectable in upper and lower respiratory specimens during the acute phase of infection. The lowest concentration of SARS-CoV-2 viral copies this assay can detect is 250 copies / mL. A negative result does not preclude SARS-CoV-2 infection and should not be used as the sole basis for treatment or other patient management decisions.  A negative result may occur with improper specimen collection / handling, submission of specimen other than nasopharyngeal swab, presence of viral  mutation(s) within the areas targeted by this assay, and inadequate number of viral copies (<250 copies / mL). A negative result must be combined with clinical observations, patient history, and epidemiological information. Fact Sheet for Patients:   StrictlyIdeas.no Fact Sheet for Healthcare Providers: BankingDealers.co.za This test is not yet approved or cleared  by the [place] FDA and has been authorized for detection and/or diagnosis of SARS-CoV-2 by FDA under  an Emergency Use Authorization (EUA).  This EUA will remain in effect (meaning this test can be used) for the duration of the COVID-19 declaration under Section 564(b)(1) of the Act, 21 U.S.C. section 360bbb-3(b)(1), unless the authorization is terminated or revoked sooner. Performed at [facility], [place] [address], [place], [place] [address]     FURTHER DISCHARGE INSTRUCTIONS:  Get Medicines reviewed and adjusted: Please take all your medications with you for your next visit with your Primary MD  Laboratory/radiological data: Please request your Primary MD to go over all hospital tests and procedure/radiological results at the follow up, please ask your Primary MD to get all Hospital records sent to his/her office.  In some cases, they will be blood work, cultures and biopsy results pending at the time of your discharge. Please request that your primary care M.D. goes through all the records of your hospital data and follows up on these results.  Also Note the following: If you experience worsening of your admission symptoms, develop shortness of breath, life threatening emergency, suicidal or homicidal thoughts you must seek medical attention immediately by calling 911 or calling your MD immediately  if symptoms less severe.  You must read complete instructions/literature along with all the possible adverse reactions/side effects for all the Medicines you take and that have been prescribed to you. Take any new Medicines after you have completely understood and accpet all the possible adverse reactions/side effects.   Do not drive when taking Pain medications or sleeping medications (Benzodaizepines)  Do not take more than prescribed Pain, Sleep and Anxiety Medications. It is not advisable to combine anxiety,sleep and pain medications without talking with your primary care practitioner  Special Instructions: If you have smoked or chewed Tobacco  in the last 2 yrs please stop  smoking, stop any regular Alcohol  and or any Recreational drug use.  Wear Seat belts while driving.  Please note: You were cared for by a hospitalist during your hospital stay. Once you are discharged, your primary care physician will handle any further medical issues. Please note that NO REFILLS for any discharge medications will be authorized once you are discharged, as it is imperative that you return to your primary care physician (or establish a relationship with a primary care physician if you do not have one) for your post hospital discharge needs so that they can reassess your need for medications and monitor your lab values.  Total Time spent coordinating discharge including counseling, education and face to face time equals 35 minutes.  SignedOren Binet 04/20/2020 10:27 AM

## 2020-04-20 NOTE — Progress Notes (Signed)
   04/19/20 2258  Assess: MEWS Score  BP (!) 194/122  Pulse Rate 79  ECG Heart Rate 78  Resp 13  SpO2 99 %  O2 Device Room Air  Patient Activity (if Appropriate) In bed  Assess: MEWS Score  MEWS Temp 0  MEWS Systolic 0  MEWS Pulse 0  MEWS RR 1  MEWS LOC 0  MEWS Score 1  MEWS Score Color Green  Treat  MEWS Interventions Administered prn meds/treatments  Notify: Charge Nurse/RN  Name of Charge Nurse/RN Notified [place], RN  Date Charge Nurse/RN Notified 04/19/20  Time Charge Nurse/RN Notified 2310  Notify: Provider  Provider Name/Title M. Sharlet Salina, NP  Date Provider Notified 04/19/20  Time Provider Notified 2313  Notification Type Page  Notification Reason Change in status  Response No new orders  Date of Provider Response 04/19/20  Time of Provider Response 2316  Document  Patient Outcome Stabilized after interventions  Progress note created (see row info) Yes   This RN attempted to give patient HS meds. Patient woke easily, answered a few questions and followed a few commands, but then falling asleep and too drowsy to answer questions or follow commands. This Designer, multimedia attempted to wake patient. Patient would open eyes, but then close them again. Patient was incontinent of urine. With bathing and linen change, patient now alert. M. Sharlet Salina, NP in to see patient. Will continue to monitor.

## 2020-04-20 NOTE — Progress Notes (Signed)
Inpatient Rehabilitation Medication Review by a Pharmacist  A complete drug regimen review was completed for this patient to identify any potential clinically significant medication issues.  Clinically significant medication issues were identified:  yes   Type of Medication Issue Identified Description of Issue Plan   Drug Interaction(s) (clinically significant)      Duplicate Therapy      Allergy      No Medication Administration End Date  Aspirin/Plavix for 3 months followed by Plavix alone Pharmacist to follow up 6/4   Incorrect Dose      Additional Drug Therapy Needed      Other  - Resume home carvedilol 25 BID, amlodipine 10mg , hydralazine 25 TID, losartan 50 slowly over the next few days as permissive HTN is not needed - Resume metformin soon  Pharmacist to follow up 6/4      Pharmacist comments: Discharge summary stated to resume metformin and carvedilol on discharge and to resume other blood pressure medications over the next few days.   Time spent performing this drug regimen review (minutes):  [place], PharmD, [place], [facility] Clinical Pharmacist  Please check AMION for all [place] phone numbers After 10:00 PM, call [place] [phone]

## 2020-04-20 NOTE — Progress Notes (Addendum)
Patient transferred to [place] room 8. All belongings transferred with patient. Two IV in place. Education and care plan completed. Patient given breathing treatment prior to transport by RT. Daughter updated on POC. Report given to [facility]. Care relinquished at this time.

## 2020-04-20 NOTE — Progress Notes (Signed)
Inpatient Rehab Admissions:  Met with pt bedside this AM and notified him of CIR bed offer. Pt has accepted. I have received medical clearance from Dr. Sloan Leiter for admit to CIR today. Notified pt's daughter Katharine Look to review insurance benefits letter and consent form. RN and [facility] team aware of plan for admit today. Please call if questions.   Raechel Ache, OTR/L  Rehab Admissions Coordinator  [phone] 04/20/2020 10:46 AM

## 2020-04-20 NOTE — Progress Notes (Signed)
Raechel Ache, OT  Rehab Admission Coordinator  Physical Medicine and Rehabilitation  PMR Pre-admission      Signed  Date of Service:  04/19/2020  4:49 PM      Related encounter: ED to Hosp-Admission (Discharged) from 04/17/2020 in [facility]      Signed        PMR Admission Coordinator Pre-Admission Assessment   Patient: [name] is an 59 y.o., male MRN: [id number] DOB: 1961/07/10 Height: 5\' 11"  (180.3 cm) Weight: 100 kg                                                                                                                                                  Insurance Information HMO:     PPO:      PCP:      IPA:      80/20:      OTHER:  PRIMARY: Medicaid [place]      Policy#: [id number]      Subscriber: patient Coverage Code: [id number] CM Name:       Phone#:      Fax#:  Pre-Cert#:       Employer:  Benefits:  Phone #: [phone]     Name: verified eligibility online via [place] on 04/20/20 Eff. Date: verified eligibility on 04/20/20     Deduct:       Out of Pocket Max:      Life Max:   CIR: Covered per Medicaid guidelines      SNF:  Outpatient:      Co-Pay:  Home Health:       Co-Pay:  DME:      Co-Pay:  Providers:  SECONDARY: None      Policy#:       Phone#:    Development worker, community:       Phone#:    The Engineer, petroleum" for patients in Inpatient Rehabilitation Facilities with attached "Privacy Act [place] Records" was provided and verbally reviewed with: N/A   Emergency Contact Information Contact Information     Name Relation Home Work McKinley, sandra Legal Guardian     [phone]    Anderson,Martavious Relative     [phone]    Grace,Donnell Relative     [phone]       Current Medical History  Patient Admitting Diagnosis: Left pons infarct   History of Present Illness: [name] is a 59 year old right-handed male with 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  Patient lives with children 1 level home 3 steps to entry.  Ambulated modified independent with a rolling walker prior to admission.  Family assistance as needed.  Presented 04/17/2020 with increasing right side weakness and dysarthria.  MRI identified small acute infarct of the left pons.  No hemorrhage or mass-effect.  Moderate chronic ischemic microangiopathy.  Patient did not receive TPA.  MRA without emergent large vessel occlusion.  CT angiogram head and neck advanced intracranial atherosclerosis with high-grade narrowing at the supraclinoid right ICA, left A1 origin, bilateral pericallosal arteries and bilateral PCA branches.  Echocardiogram with ejection fraction of 50%.  The left ventricle demonstrated global hypokinesis.  Admission chemistries BUN 23 creatinine 1.40 urine drug screen negative as well as alcohol negative.  Neurology follow-up maintained on aspirin 325 mg and Plavix for CVA prophylaxis x3 months then Plavix alone.  Subcutaneous Lovenox for DVT prophylaxis.  Currently on a dysphagia #2 thin liquid diet.  Therapy evaluations completed and patient is to be admitted for a comprehensive rehab program on 04/20/20.   Complete NIHSS TOTAL: 8   Past Medical History      Past Medical History:  Diagnosis Date  . Alcohol abuse    . Arthritis    . Crack cocaine use    . Depression    . Diabetes mellitus    . Drug abuse ([place])    . DTs (delirium tremens) ([place])      history of   . Gout    . Noncompliance with medication regimen    . Schizophrenia ([place])    . Stroke ([place])    . Systolic heart failure secondary to hypertension ([place])    . Uncontrolled hypertension        Family History  family history includes Hypertension in an  other family member.   Prior Rehab/Hospitalizations:  Has the patient had prior rehab or hospitalizations prior to admission? Yes   Has the patient had major surgery during 100 days prior to admission? No   Current Medications    Current Facility-Administered Medications:  .  acetaminophen (TYLENOL) tablet 650 mg, 650 mg, Oral, Q4H PRN, 650 mg at 04/18/20 1643 **OR** acetaminophen (TYLENOL) 160 MG/5ML solution 650 mg, 650 mg, Per Tube, Q4H PRN **OR** acetaminophen (TYLENOL) suppository 650 mg, 650 mg, Rectal, Q4H PRN, Zada Finders R, MD .  aspirin EC tablet 325 mg, 325 mg, Oral, Daily, Rosalin Hawking, MD, 325 mg at 04/20/20 0836 .  atorvastatin (LIPITOR) tablet 40 mg, 40 mg, Oral, Daily, Zada Finders R, MD, 40 mg at 04/20/20 0837 .  chlorhexidine (PERIDEX) 0.12 % solution 15 mL, 15 mL, Mouth Rinse, BID, Ghimire, Henreitta Leber, MD, 15 mL at 04/20/20 0837 .  clopidogrel (PLAVIX) tablet 75 mg, 75 mg, Oral, Daily, Zada Finders R, MD, 75 mg at 04/20/20 0836 .  diclofenac Sodium (VOLTAREN) 1 % topical gel 2 g, 2 g, Topical, QID, Lenore Cordia, MD, 2 g at 04/20/20 0837 .  DULoxetine (CYMBALTA) DR capsule 30 mg, 30 mg, Oral, BID, Zada Finders R, MD, 30 mg at 04/20/20 0836 .  enoxaparin (LOVENOX) injection 40 mg, 40 mg, Subcutaneous, Q24H, Zada Finders R, MD, 40 mg at 04/20/20 6378 .  gabapentin (NEURONTIN) capsule 300 mg, 300 mg, Oral, TID, Zada Finders R, MD, 300 mg at 04/20/20 0841 .  insulin aspart (novoLOG) injection 0-9 Units, 0-9 Units, Subcutaneous, TID WC, Lenore Cordia, MD, 1 Units at 04/20/20 0835 .  labetalol (NORMODYNE) injection 10 mg, 10 mg, Intravenous, Q4H PRN, Lenore Cordia, MD, 10 mg at 04/19/20 2318 .  MEDLINE mouth rinse, 15 mL, Mouth Rinse, q12n4p, Ghimire, Henreitta Leber, MD, 15  mL at 04/19/20 1711 .  paliperidone (INVEGA) 24 hr tablet 3 mg, 3 mg, Oral, Daily, Zada Finders R, MD, 3 mg at 04/20/20 0837 .  senna-docusate (Senokot-S) tablet 1 tablet, 1 tablet, Oral, QHS PRN, Lenore Cordia, MD   Patients Current Diet:     Diet Order                      Diet - low sodium heart healthy           Diet Carb Modified           DIET DYS 2 Room service appropriate? Yes with Assist; Fluid consistency: Thin  Diet effective now                   Precautions / Restrictions Precautions Precautions: Fall Restrictions Weight Bearing Restrictions: No    Has the patient had 2 or more falls or a fall with injury in the past year?No   Prior Activity Level Community (5-7x/wk): per daugther, she tries to get him out of the house daily with him; He uses RW for mobility and is Mod I/Supervision at baseline.    Prior Functional Level Prior Function Level of Independence: Needs assistance Gait / Transfers Assistance Needed: amb modified independent with rolling walker Comments: Uses RW   Self Care: Did the patient need help bathing, dressing, using the toilet or eating?  Independent   Indoor Mobility: Did the patient need assistance with walking from room to room (with or without device)? Independent   Stairs: Did the patient need assistance with internal or external stairs (with or without device)? Needed some help   Functional Cognition: Did the patient need help planning regular tasks such as shopping or remembering to take medications? Needed some help   Home Assistive Devices / [place] Devices/Equipment: Gilford Rile (specify type)(Front wheeled walker.) Home Equipment: Walker - 2 wheels   Prior Device Use: Indicate devices/aids used by the patient prior to current illness, exacerbation or injury? Walker   Current Functional Level Cognition   Overall Cognitive Status: No family/caregiver present to determine baseline cognitive functioning Orientation Level: Oriented X4 General Comments: Pt limited by dysarthric speech, appears cognitively appropriate throughout session.    Extremity Assessment (includes Sensation/Coordination)   Upper  Extremity Assessment: Generalized weakness, RUE deficits/detail RUE Deficits / Details: gross strength 3/5, able to move through full ROM.  Decreased coordination. RUE Sensation: WNL RUE Coordination: decreased fine motor, decreased gross motor  Lower Extremity Assessment: Defer to PT evaluation RLE Deficits / Details: grossly 3+/5 strength     ADLs   Overall ADL's : Needs assistance/impaired Eating/Feeding: Sitting, Minimal assistance Grooming: Minimal assistance, Sitting, Oral care Upper Body Bathing: Minimal assistance, Sitting Lower Body Bathing: Moderate assistance, Sitting/lateral leans Upper Body Dressing : Minimal assistance, Sitting Lower Body Dressing: Moderate assistance, Sitting/lateral leans Toilet Transfer: Maximal assistance, Stand-pivot Functional mobility during ADLs: Maximal assistance     Mobility   Overal bed mobility: Needs Assistance Bed Mobility: Rolling, Sidelying to Sit Rolling: Mod assist Sidelying to sit: Mod assist, +2 for safety/equipment, HOB elevated, +2 for physical assistance Sit to supine: Mod assist General bed mobility comments: Mod assist for roll to L for RLE and truncal management, protection of RUE. Mod +2 for sidelying to sit for trunk elevation, lifting LEs off bed, and scooting to EOB with use of bed pad and lateral leaning L and R.     Transfers   Overall transfer level: Needs  assistance Equipment used: Rolling walker (2 wheeled) Transfer via Lift Equipment: Stedy Transfers: Sit to/from Stand Sit to Stand: Mod assist, +2 physical assistance, +2 safety/equipment, From elevated surface Stand pivot transfers: Max assist General transfer comment: Mod assist +2 for power up, hip extension, R hand placement on RW, and steadying upon standing. Verbal cuing for hand placement when rising, sit to stand x3 with sustained stand ~30 seconds each.     Ambulation / Gait / Stairs / Wheelchair Mobility   Ambulation/Gait Ambulation/Gait assistance: Mod  assist, +2 physical assistance, +2 safety/equipment Gait Distance (Feet): 2 Feet Assistive device: Rolling walker (2 wheeled) Gait Pattern/deviations: Step-to pattern, Decreased weight shift to right, Decreased step length - right General Gait Details: Lateral stepping towards HOB, Mod assist +2 for RLE knee flexion blocking, RLE translation towards LLE during side stepping. Verbal cuing for weight shifting L and R for side steps towards HOB. x2 trials Gait velocity: decr     Posture / Balance Dynamic Sitting Balance Sitting balance - Comments: Able to sit EOB without PT assist, R lateral leaning noted but corrects posture with verbal cuing for upright (no pt awareness of leaning). Sat EOB x10 minutes total in between transfers, initially sitting up Balance Overall balance assessment: Needs assistance Sitting-balance support: Feet supported, Single extremity supported Sitting balance-Leahy Scale: Fair Sitting balance - Comments: Able to sit EOB without PT assist, R lateral leaning noted but corrects posture with verbal cuing for upright (no pt awareness of leaning). Sat EOB x10 minutes total in between transfers, initially sitting up Postural control: Right lateral lean Standing balance-Leahy Scale: Poor Standing balance comment: requires external assist of RW and +2, able to lean laterally with R knee blocking, standing tolerance ~30 seconds.     Special needs/care consideration  Diabetic management: yes   Designated visitor: daughter Katharine Look        Previous Home Environment (from acute therapy documentation) Living Arrangements: Children Available Help at Discharge: Family, Available 24 hours/day Type of Home: House Home Layout: One level Home Access: Stairs to enter Entrance Stairs-Rails: None Entrance Stairs-Number of Steps: 3 Bathroom Shower/Tub: Chiropodist: [place]: No   Discharge Living Setting Plans for Discharge Living Setting:  Lives with (comment), Apartment(lives with daughter) Type of Home at Discharge: Apartment Discharge Home Layout: One level Discharge Home Access: Stairs to enter Entrance Stairs-Rails: None Entrance Stairs-Number of Steps: 1 curb step  Discharge Bathroom Shower/Tub: Tub/shower unit Discharge Bathroom Toilet: Standard Discharge Bathroom Accessibility: No How Accessible: Other (comment)(daughter reports tight bathroom space) Does the patient have any problems obtaining your medications?: No   Social/Family/Support Systems Patient Roles: Other (Comment)(lives with daughter) Contact Information: Katharine Look (daughter): [phone] Anticipated Caregiver: daughter Anticipated Caregiver's Contact Information: see above Ability/Limitations of Caregiver: Supervision Caregiver Availability: 24/7(close to 24/7 (says she works from home)) Discharge Plan Discussed with Primary Caregiver: Yes(pt and his daughter) Is Caregiver In Agreement with Plan?: Yes Does Caregiver/Family have Issues with Lodging/Transportation while Pt is in Rehab?: No     Goals Patient/Family Goal for Rehab: PT/OT: Mod I/Supervision; SLP: Mod I Expected length of stay: 12-15 days Cultural Considerations: NA Pt/Family Agrees to Admission and willing to participate: Yes Program Orientation Provided & Reviewed with Pt/Caregiver Including Roles  & Responsibilities: Yes(pt and his daughter)  Barriers to Discharge: Home environment access/layout  Barriers to Discharge Comments: one step to enter apartment (curb).      Decrease burden of Care through IP rehab admission: NA     Possible  need for SNF placement upon discharge:Not anticipated; pt has good social support at DC from his daughter whom he lives with and can provide recommended supervision at DC. Pt and daughter understand the plan is to DC home after CIR stay.      Patient Condition: This patient's medical and functional status has changed since the consult dated:  04/19/20 in which the Rehabilitation Physician determined and documented that the patient's condition is appropriate for intensive rehabilitative care in an inpatient rehabilitation facility. Medical changes are: on night of 6/2, pt with elevated BP, requiring IV labetalol.  Functional changes are: pt able to attempt gait, completing 2 feet with Mod A +2 using RW. After evaluating the patient today and speaking with the Rehabilitation physician and acute team, the patient remains appropriate for inpatient rehab. Will admit to inpatient rehab today.   Preadmission Screen Completed By:  Raechel Ache, OT, 04/20/2020 10:57 AM ______________________________________________________________________   Discussed status with Dr. Dagoberto Ligas on 04/20/20 at 10:56AM and received approval for admission today.   Admission Coordinator:  Raechel Ache, time 10:56AM/Date 04/20/20.             Cosigned by: Courtney Heys, MD at 04/20/2020 11:01 AM  Revision History

## 2020-04-20 NOTE — Discharge Summary (Deleted)
PATIENT DETAILS Name: [name] Age: 59 y.o. Sex: male Date of Birth: November 28, 1960 MRN: [id number]. Admitting Physician: Jonetta Osgood, MD WNI:OEVO, Coolidge Breeze, FNP  Admit Date: 04/17/2020 Discharge date: 04/20/2020  Recommendations for Outpatient Follow-up:  1. Follow up with PCP in 1-2 weeks 2. Please obtain CMP/CBC in one week 3. Aspirin/Plavix for 3 months followed by Plavix alone 4. Please ensure follow-up at stroke clinic. 5. Initial antihypertensives were held and permissive hypertension was allowed-resuming Coreg-please resume amlodipine, hydralazine, losartan over the next few days.  Admitted From:  Home  Disposition: [place]: No  Equipment/Devices: None  Discharge Condition: Stable  CODE STATUS: FULL CODE  Diet recommendation:  Diet Order            DIET - DYS 1 Room service appropriate? Yes; Fluid consistency: Thin  Diet effective now        Diet - low sodium heart healthy        Diet Carb Modified               Brief Narrative: Patient is a 59 y.o. male with prior history of CVA with chronic residual right-sided weakness, chronic combined systolic/diastolic heart failure, DM-2, HTN, HLD, schizoaffective disorder who presented with worsening right-sided weakness and dysarthria.  He was subsequently admitted for further evaluation and treatment.  Significant events: 5/31>> admit for evaluation of worsening right-sided weakness and dysarthria (more than usual baseline)  Significant studies: 6/1>> CT angio head/neck: Advanced intracranial atherosclerosis with high-grade narrowings at the supraclinoid right ICA, left A1 origin, bilateral pericallosal arteries, bilateral PCA branches, cervical carotid atherosclerosis without flow-limiting stenosis. 6/1>> MRI brain: Small infarct left pons, punctate focus of DWI intensity in the right frontal white matter. 6/1>> Echo: EF 45-50% 5/31>> CT head: No acute intracranial  abnormality 5/31>> chest x-ray: No active disease.  Antimicrobial therapy: None  Microbiology data: None  Procedures : None  Consults: Neurology  Brief Hospital Course: Acute CVA: Likely secondary to small vessel disease.  Continues to have some dysarthria and right-sided weakness more than usual baseline.-not sure if this is his baseline.  Imaging studies as noted above.  A1c 7.7, LDL 114-echo with slightly reduced EF but without any embolic source.  Continue dual antiplatelet agents with aspirin/Plavix for 3 months, then Plavix alone.  Continue statin.    AKI: Appears to be mild-likely hemodynamically mediated-resolved.  Repeat electrolytes periodically.  Hypertension:allow permissive HTN-given acute CVA.  Continue to hold all antihypertensive agents.  Chronic combined systolic and diastolic CHF (EF 35-00% by TTE 02/01/2018): Euvolemic on exam-follow closely.   DM-2 (A1c 7.7 on 6/1): CBGs stable on SSI-should be okay to resume Metformin on discharge.   Hyperlipidemia: Continue statin  Schizoaffective disorder: Stable-continue Invega and Cymbalta  History of polysubstance use disorder:Patient denies any recent illicit drug, alcohol, or tobacco use. UDS and EtOH levels are negative  Obesity: Estimated body mass index is 30.84 kg/m as calculated from the following:   Height as of this encounter: 5\' 11"  (1.803 m).   Weight as of this encounter: 100.3 kg.    Discharge Diagnoses:  Principal Problem:   Right sided weakness Active Problems:   Diabetes mellitus ([place])   Hypertension associated with diabetes ([place])   Hyperlipidemia associated with type 2 diabetes mellitus (HCC)   Schizoaffective disorder, bipolar type ([place])   AKI (acute kidney injury) ([place])   History of CVA with residual deficit   Chronic combined systolic (congestive) and diastolic (congestive) heart failure ([place])  Substance use disorder   CVA (cerebral vascular accident) [facility])   Discharge  Instructions:  Activity:  As tolerated with Full fall precautions use walker/cane & assistance as needed   Discharge Instructions    Ambulatory referral to Neurology   Complete by: As directed    Follow up with stroke clinic NP (Jessica Vanschaick or Cecille Rubin, if both not available, consider Zachery Dauer, or [place]) at [facility] in about 4 weeks. Thanks.   Diet - low sodium heart healthy   Complete by: As directed    Diet Carb Modified   Complete by: As directed    Discharge instructions   Complete by: As directed    Follow with Primary MD  Boyce Medici, FNP in 1-2 weeks  Follow with stroke clinic in 1 week  Take aspirin along with Plavix for 3 months, then after 3 months stop aspirin and continue on Plavix  Please get a complete blood count and chemistry panel checked by your Primary MD at your next visit, and again as instructed by your Primary MD.  Get Medicines reviewed and adjusted: Please take all your medications with you for your next visit with your Primary MD  Laboratory/radiological data: Please request your Primary MD to go over all hospital tests and procedure/radiological results at the follow up, please ask your Primary MD to get all Hospital records sent to his/her office.  In some cases, they will be blood work, cultures and biopsy results pending at the time of your discharge. Please request that your primary care M.D. follows up on these results.  Also Note the following: If you experience worsening of your admission symptoms, develop shortness of breath, life threatening emergency, suicidal or homicidal thoughts you must seek medical attention immediately by calling 911 or calling your MD immediately  if symptoms less severe.  You must read complete instructions/literature along with all the possible adverse reactions/side effects for all the Medicines you take and that have been prescribed to you. Take any new Medicines after you have completely understood  and accpet all the possible adverse reactions/side effects.   Do not drive when taking Pain medications or sleeping medications (Benzodaizepines)  Do not take more than prescribed Pain, Sleep and Anxiety Medications. It is not advisable to combine anxiety,sleep and pain medications without talking with your primary care practitioner  Special Instructions: If you have smoked or chewed Tobacco  in the last 2 yrs please stop smoking, stop any regular Alcohol  and or any Recreational drug use.  Wear Seat belts while driving.  Please note: You were cared for by a hospitalist during your hospital stay. Once you are discharged, your primary care physician will handle any further medical issues. Please note that NO REFILLS for any discharge medications will be authorized once you are discharged, as it is imperative that you return to your primary care physician (or establish a relationship with a primary care physician if you do not have one) for your post hospital discharge needs so that they can reassess your need for medications and monitor your lab values.   Increase activity slowly   Complete by: As directed      Allergies as of 04/20/2020   No Known Allergies     Medication List    STOP taking these medications   amLODipine 10 MG tablet Commonly known as: NORVASC   hydrALAZINE 25 MG tablet Commonly known as: APRESOLINE   losartan 50 MG tablet Commonly known as: COZAAR   ondansetron 4  MG tablet Commonly known as: ZOFRAN     TAKE these medications   acetaminophen 325 MG tablet Commonly known as: TYLENOL Take 2 tablets (650 mg total) by mouth every 6 (six) hours as needed for mild pain (or Fever >/= 101).   aspirin EC 81 MG tablet Take 81 mg by mouth daily.   atorvastatin 40 MG tablet Commonly known as: LIPITOR Take 1 tablet (40 mg total) by mouth daily. Start taking on: April 21, 2020   carvedilol 25 MG tablet Commonly known as: COREG Take 1 tablet (25 mg total) by mouth 2  (two) times daily with a meal.   clopidogrel 75 MG tablet Commonly known as: PLAVIX Take 1 tablet (75 mg total) by mouth daily.   Daily-Vite Tabs Take 1 tablet by mouth daily.   diclofenac Sodium 1 % Gel Commonly known as: VOLTAREN Apply 2 g topically 4 (four) times daily.   DULoxetine 30 MG capsule Commonly known as: CYMBALTA Take 1 capsule (30 mg total) by mouth 2 (two) times daily.   ergocalciferol 1.25 MG (50000 UT) capsule Commonly known as: VITAMIN D2 Take 1 capsule by mouth once a week.   FLUoxetine 10 MG capsule Commonly known as: PROZAC Take 10 mg by mouth daily.   folic acid 1 MG tablet Commonly known as: FOLVITE Take 1 mg by mouth daily.   gabapentin 300 MG capsule Commonly known as: NEURONTIN Take 1 capsule (300 mg total) by mouth 3 (three) times daily.   hydrOXYzine 25 MG tablet Commonly known as: ATARAX/VISTARIL Take 25 mg by mouth 2 (two) times daily.   metFORMIN 500 MG tablet Commonly known as: GLUCOPHAGE Take 1 tablet (500 mg total) by mouth 2 (two) times daily with a meal.   mirtazapine 7.5 MG tablet Commonly known as: REMERON Take 7.5 mg by mouth at bedtime.   paliperidone 3 MG 24 hr tablet Commonly known as: INVEGA Take 1 tablet (3 mg total) by mouth daily. What changed: how much to take   pantoprazole 20 MG tablet Commonly known as: PROTONIX Take 20 mg by mouth daily.   Senna Plus 8.6-50 MG tablet Generic drug: senna-docusate Take 1 tablet by mouth daily.   traMADol 50 MG tablet Commonly known as: ULTRAM Take 50 mg by mouth every 6 (six) hours as needed for moderate pain.      Follow-up Information    [facility]. Schedule an appointment as soon as possible for a visit in 4 week(s).   Specialty: Neurology Contact information: [address] [place] [phone]       Boyce Medici, FNP. Schedule an appointment as soon as possible for a visit in 1 week(s).   Specialty:  Nurse Practitioner Contact information: [place] [address] [phone]          No Known Allergies    Other Procedures/Studies: CT ANGIO HEAD W OR WO CONTRAST  Result Date: 04/18/2020 CLINICAL DATA:  Stroke follow-up EXAM: CT ANGIOGRAPHY HEAD AND NECK TECHNIQUE: Multidetector CT imaging of the head and neck was performed using the standard protocol during bolus administration of intravenous contrast. Multiplanar CT image reconstructions and MIPs were obtained to evaluate the vascular anatomy. Carotid stenosis measurements (when applicable) are obtained utilizing NASCET criteria, using the distal internal carotid diameter as the denominator. CONTRAST:  105mL OMNIPAQUE IOHEXOL 350 MG/ML SOLN COMPARISON:  Brain MRI and MRA from earlier today FINDINGS: CTA NECK FINDINGS Aortic arch: Atheromatous wall thickening.  No acute finding.  Right carotid system: Tortuous with partial retropharyngeal course. Mild atheromatous changes at the bifurcation and distal common carotid. No flow limiting stenosis or ulceration. Left carotid system: Mild atheromatous plaque at the proximal common carotid. No stenosis or ulceration. Vertebral arteries: No proximal subclavian stenosis. The codominant vertebral arteries are smooth and widely patent to the dura. Skeleton: Advanced mid cervical disc degeneration with disc space collapse and endplate sclerosis. Other neck: No acute finding. Upper chest: No acute finding Review of the MIP images confirms the above findings CTA HEAD FINDINGS Anterior circulation: Atheromatous plaque along the bilateral carotid siphons with 45% left periophthalmic ICA narrowing and 75% narrowing at the supraclinoid right ICA. Severe bilateral pericallosal stenosis with areas of imperceptible luminal enhancement. Severe left A1 origin stenosis. Accounting for atherosclerotic irregularity no aneurysm is seen. Posterior circulation: The vertebral and basilar arteries are smooth and  widely patent. Advanced atheromatous type narrowing of bilateral P4 branches. Venous sinuses: Diffusely patent Anatomic variants: None significant Review of the MIP images confirms the above findings IMPRESSION: 1. Advanced intracranial atherosclerosis with high-grade narrowings at the supraclinoid right ICA, left A1 origin, bilateral pericallosal arteries, and bilateral PCA branches. 2. Cervical carotid atherosclerosis without flow limiting stenosis in the neck. Electronically Signed   By: Monte Fantasia M.D.   On: 04/18/2020 08:14   DG Chest 1 View  Result Date: 04/17/2020 CLINICAL DATA:  59 year old male with cough and chest pain. EXAM: CHEST  1 VIEW COMPARISON:  Chest radiograph dated 02/07/2020. FINDINGS: The heart size and mediastinal contours are within normal limits. Both lungs are clear. The visualized skeletal structures are unremarkable. IMPRESSION: No active disease. Electronically Signed   By: Anner Crete M.D.   On: 04/17/2020 22:36   CT ANGIO NECK W OR WO CONTRAST  Result Date: 04/18/2020 CLINICAL DATA:  Stroke follow-up EXAM: CT ANGIOGRAPHY HEAD AND NECK TECHNIQUE: Multidetector CT imaging of the head and neck was performed using the standard protocol during bolus administration of intravenous contrast. Multiplanar CT image reconstructions and MIPs were obtained to evaluate the vascular anatomy. Carotid stenosis measurements (when applicable) are obtained utilizing NASCET criteria, using the distal internal carotid diameter as the denominator. CONTRAST:  2mL OMNIPAQUE IOHEXOL 350 MG/ML SOLN COMPARISON:  Brain MRI and MRA from earlier today FINDINGS: CTA NECK FINDINGS Aortic arch: Atheromatous wall thickening.  No acute finding. Right carotid system: Tortuous with partial retropharyngeal course. Mild atheromatous changes at the bifurcation and distal common carotid. No flow limiting stenosis or ulceration. Left carotid system: Mild atheromatous plaque at the proximal common carotid. No  stenosis or ulceration. Vertebral arteries: No proximal subclavian stenosis. The codominant vertebral arteries are smooth and widely patent to the dura. Skeleton: Advanced mid cervical disc degeneration with disc space collapse and endplate sclerosis. Other neck: No acute finding. Upper chest: No acute finding Review of the MIP images confirms the above findings CTA HEAD FINDINGS Anterior circulation: Atheromatous plaque along the bilateral carotid siphons with 45% left periophthalmic ICA narrowing and 75% narrowing at the supraclinoid right ICA. Severe bilateral pericallosal stenosis with areas of imperceptible luminal enhancement. Severe left A1 origin stenosis. Accounting for atherosclerotic irregularity no aneurysm is seen. Posterior circulation: The vertebral and basilar arteries are smooth and widely patent. Advanced atheromatous type narrowing of bilateral P4 branches. Venous sinuses: Diffusely patent Anatomic variants: None significant Review of the MIP images confirms the above findings IMPRESSION: 1. Advanced intracranial atherosclerosis with high-grade narrowings at the supraclinoid right ICA, left A1 origin, bilateral pericallosal arteries, and bilateral PCA branches.  2. Cervical carotid atherosclerosis without flow limiting stenosis in the neck. Electronically Signed   By: Monte Fantasia M.D.   On: 04/18/2020 08:14   MR ANGIO HEAD WO CONTRAST  Result Date: 04/18/2020 CLINICAL DATA:  P[place]ine infarct EXAM: MRA HEAD WITHOUT CONTRAST TECHNIQUE: Angiographic images of the Circle of Willis were obtained using MRA technique without intravenous contrast. COMPARISON:  Brain MRI 04/18/2020 FINDINGS: Examination is degraded mildly by motion. POSTERIOR CIRCULATION: --Basilar artery: Normal. --Superior cerebellar arteries: Normal. --Posterior cerebral arteries: Severe stenosis of the right PCA proximal P3 segment. Normal left PCA. ANTERIOR CIRCULATION: --Intracranial internal carotid arteries: Normal. --Anterior  cerebral arteries (ACA): Normal. Both A1 segments are present. Patent anterior communicating artery (a-comm). --Middle cerebral arteries (MCA): Normal. IMPRESSION: Motion degraded study without emergent large vessel occlusion. Electronically Signed   By: Ulyses Jarred M.D.   On: 04/18/2020 03:54   MR BRAIN WO CONTRAST  Result Date: 04/18/2020 CLINICAL DATA:  Stroke follow-up EXAM: MRI HEAD WITHOUT CONTRAST TECHNIQUE: Multiplanar, multiecho pulse sequences of the brain and surrounding structures were obtained without intravenous contrast. COMPARISON:  None. FINDINGS: Brain: There is a small acute infarct of the left pons. There is a punctate focus of DWI hyperintensity in the right frontal white matter without a clear correlate on ADC. Early confluent hyperintense T2-weighted signal of the periventricular and deep white matter, most commonly due to chronic ischemic microangiopathy. Old bilateral basal ganglia small vessel infarcts. Multifocal chronic microhemorrhage within the cerebellum and both cerebral hemispheres. There are 5-7 foci total. Normal midline structures. Vascular: Normal flow voids. Skull and upper cervical spine: Normal marrow signal. Sinuses/Orbits: Negative. Other: None. IMPRESSION: 1. Small acute infarct of the left pons. No hemorrhage or mass effect. 2. Punctate focus of DWI hyperintensity in the right frontal white matter without a clear correlate on ADC. This may indicate a small acute or subacute infarct. 3. Moderate chronic ischemic microangiopathy. 4. Multifocal chronic microhemorrhage within the cerebellum and both cerebral hemispheres. Electronically Signed   By: Ulyses Jarred M.D.   On: 04/18/2020 03:21   ECHOCARDIOGRAM COMPLETE  Result Date: 04/18/2020    ECHOCARDIOGRAM REPORT   Patient Name:   [name] Date of Exam: 04/18/2020 Medical Rec #:  [id number]            Height:       71.0 in Accession #:    [id number]           Weight:       221.1 lb Date of Birth:  Oct 01, 1961            BSA:          2.200 m Patient Age:    73 years             BP:           152/113 mmHg Patient Gender: M                    HR:           81 bpm. Exam Location:  Inpatient Procedure: 2D Echo, Cardiac Doppler and Color Doppler Indications:    Stroke 434.91 / I163.9  History:        Patient has prior history of Echocardiogram examinations, most                 recent 02/01/2018. CHF; Risk Factors:Hypertension and Diabetes.                 Alcohol abuse. Cocaine  abuse.  Sonographer:    Tiffany Dance Referring Phys: [id number] [place]  1. Left ventricular ejection fraction, by estimation, is 45 to 50%. The left ventricle has mildly decreased function. The left ventricle demonstrates global hypokinesis. Left ventricular diastolic parameters are indeterminate.  2. Right ventricular systolic function is normal. The right ventricular size is normal.  3. The mitral valve is normal in structure. Trivial mitral valve regurgitation. No evidence of mitral stenosis.  4. Unable to determine valve morphology due to image quality. Aortic valve regurgitation is not visualized. No aortic stenosis is present.  5. Aortic dilatation noted. 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  6. The inferior vena cava is dilated in size with >50% respiratory variability, suggesting right atrial pressure of 8 mmHg. FINDINGS  Left Ventricle: Left ventricular ejection fraction, by estimation, is 45 to 50%. The left ventricle has mildly decreased function. The left ventricle demonstrates global hypokinesis. The left ventricular internal cavity size was normal in size. There is  borderline left ventricular hypertrophy. Left ventricular diastolic parameters are indeterminate. Right Ventricle: The right ventricular size is normal. No increase in right ventricular wall thickness. Right ventricular systolic function is normal. Left Atrium: Left atrial size was normal in size. Right Atrium: Right atrial size was  normal in size. Pericardium: There is no evidence of pericardial effusion. Mitral Valve: The mitral valve is normal in structure. Trivial mitral valve regurgitation. No evidence of mitral valve stenosis. Tricuspid Valve: The tricuspid valve is normal in structure. Tricuspid valve regurgitation is trivial. Aortic Valve: Unable to determine valve morphology due to image quality. Aortic valve regurgitation is not visualized. No aortic stenosis is present. Pulmonic Valve: The pulmonic valve was grossly normal. Pulmonic valve regurgitation is not visualized. No evidence of pulmonic stenosis. Aorta: Aortic dilatation noted. 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 Venous: The inferior vena cava is dilated in size with greater than 50% respiratory variability, suggesting right atrial pressure of 8 mmHg. IAS/Shunts: No atrial level shunt detected by color flow Doppler.  LEFT VENTRICLE PLAX 2D LVIDd:         5.40 cm  Diastology LVIDs:         4.10 cm  LV e' lateral:   8.38 cm/s LV PW:         1.00 cm  LV E/e' lateral: 10.8 LV IVS:        1.30 cm  LV e' medial:    5.22 cm/s LVOT diam:     2.30 cm  LV E/e' medial:  17.3 LV SV:         74 LV SV Index:   33 LVOT Area:     4.15 cm  RIGHT VENTRICLE             IVC RV Basal diam:  2.30 cm     IVC diam: 2.20 cm RV S prime:     14.50 cm/s TAPSE (M-mode): 1.8 cm LEFT ATRIUM             Index LA diam:        3.60 cm 1.64 cm/m LA Vol (A2C):   63.9 ml 29.04 ml/m LA Vol (A4C):   40.7 ml 18.50 ml/m LA Biplane Vol: 52.8 ml 23.99 ml/m  AORTIC VALVE LVOT Vmax:   90.20 cm/s LVOT Vmean:  59.700 cm/s LVOT VTI:    0.177 m  AORTA Ao Root diam: 4.10 cm Ao Asc diam:  4.00 cm MITRAL  VALVE MV Area (PHT): 5.01 cm    SHUNTS MV Decel Time: 152 msec    Systemic VTI:  0.18 m MV E velocity: 90.50 cm/s  Systemic Diam: 2.30 cm Buford Dresser MD Electronically signed by Buford Dresser MD Signature Date/Time: 04/18/2020/10:21:13 PM    Final    CT HEAD CODE STROKE WO  CONTRAST  Result Date: 04/17/2020 CLINICAL DATA:  Code stroke.  Right-sided facial droop EXAM: CT HEAD WITHOUT CONTRAST TECHNIQUE: Contiguous axial images were obtained from the base of the skull through the vertex without intravenous contrast. COMPARISON:  03/03/2018 FINDINGS: Brain: There is no mass, hemorrhage or extra-axial collection. The size and configuration of the ventricles and extra-axial CSF spaces are normal. There is hypoattenuation of the periventricular white matter, most commonly indicating chronic ischemic microangiopathy. There are small vessel infarcts of the left basal ganglia that are likely chronic. Vascular: No abnormal hyperdensity of the major intracranial arteries or dural venous sinuses. No intracranial atherosclerosis. Skull: The visualized skull base, calvarium and extracranial soft tissues are normal. Sinuses/Orbits: No fluid levels or advanced mucosal thickening of the visualized paranasal sinuses. No mastoid or middle ear effusion. The orbits are normal. ASPECTS [facility] Stroke Program Early CT Score) - Ganglionic level infarction (caudate, lentiform nuclei, internal capsule, insula, M1-M3 cortex): 7 - Supraganglionic infarction (M4-M6 cortex): 3 Total score (0-10 with 10 being normal): 10 IMPRESSION: 1. No acute intracranial abnormality. 2. ASPECTS is 10. 3. Chronic ischemic microangiopathy and small vessel infarcts of the left basal ganglia that are likely old. 4. These results were communicated to Dr. Amie Portland at 9:58 pm on 04/17/2020 by text page via the [facility] messaging system. Electronically Signed   By: Ulyses Jarred M.D.   On: 04/17/2020 21:58     TODAY-DAY OF DISCHARGE:  Subjective:   [name] today has no headache,no chest abdominal pain,no new weakness tingling or numbness, feels much better wants to go home today.   Objective:   Blood pressure (!) 166/127, pulse 87, temperature 98 F (36.7 C), temperature source Oral, resp. rate 16, height 5'  11" (1.803 m), weight 100 kg, SpO2 99 %.  Intake/Output Summary (Last 24 hours) at 04/20/2020 1721 Last data filed at 04/20/2020 1400 Gross per 24 hour  Intake 1020 ml  Output 1750 ml  Net -730 ml   Filed Weights   04/17/20 2209 04/18/20 0141 04/20/20 0627  Weight: 101.1 kg 100.3 kg 100 kg    Exam: Awake Alert, Oriented *3, No new F.N deficits, Normal affect [place].AT,PERRAL Supple Neck,No JVD, No cervical lymphadenopathy appriciated.  Symmetrical Chest wall movement, Good air movement bilaterally, CTAB RRR,No Gallops,Rubs or new Murmurs, No Parasternal Heave +ve B.Sounds, Abd Soft, Non tender, No organomegaly appriciated, No rebound -guarding or rigidity. No Cyanosis, Clubbing or edema, No new Rash or bruise   PERTINENT RADIOLOGIC STUDIES: No results found.   PERTINENT LAB RESULTS: CBC: Recent Labs    04/17/20 2146 04/17/20 2150  WBC  --  7.7  HGB 12.9* 12.5*  HCT 38.0* 39.1  PLT  --  210   CMET CMP     Component Value Date/Time   NA 139 04/18/2020 0556   K 3.3 (L) 04/18/2020 0556   CL 101 04/18/2020 0556   CO2 25 04/18/2020 0556   GLUCOSE 167 (H) 04/18/2020 0556   BUN 15 04/18/2020 0556   CREATININE 1.19 04/18/2020 0556   CALCIUM 9.1 04/18/2020 0556   PROT 7.5 04/17/2020 2150   ALBUMIN 3.7 04/18/2020 0556   AST 21  04/17/2020 2150   ALT 11 04/17/2020 2150   ALKPHOS 75 04/17/2020 2150   BILITOT 0.8 04/17/2020 2150   GFRNONAA >60 04/18/2020 0556   GFRAA >60 04/18/2020 0556    GFR Estimated Creatinine Clearance: 82.5 mL/min (by C-G formula based on SCr of 1.19 mg/dL). No results for input(s): LIPASE, AMYLASE in the last 72 hours. No results for input(s): CKTOTAL, CKMB, CKMBINDEX, TROPONINI in the last 72 hours. Invalid input(s): POCBNP No results for input(s): DDIMER in the last 72 hours. Recent Labs    04/18/20 0556  HGBA1C 7.7*   Recent Labs    04/18/20 0556  CHOL 195  HDL 44  LDLCALC 114*  TRIG 183*  CHOLHDL 4.4   No results for input(s): TSH,  T4TOTAL, T3FREE, THYROIDAB in the last 72 hours.  Invalid input(s): FREET3 No results for input(s): VITAMINB12, FOLATE, FERRITIN, TIBC, IRON, RETICCTPCT in the last 72 hours. Coags: Recent Labs    04/17/20 2150  INR 0.9   Microbiology: Recent Results (from the past 240 hour(s))  SARS Coronavirus 2 by RT PCR (hospital order, performed in [facility] hospital lab) Nasopharyngeal Nasopharyngeal Swab     Status: None   Collection Time: 04/18/20 12:25 AM   Specimen: Nasopharyngeal Swab  Result Value Ref Range Status   SARS Coronavirus 2 NEGATIVE NEGATIVE Final    Comment: (NOTE) SARS-CoV-2 target nucleic acids are NOT DETECTED. The SARS-CoV-2 RNA is generally detectable in upper and lower respiratory specimens during the acute phase of infection. The lowest concentration of SARS-CoV-2 viral copies this assay can detect is 250 copies / mL. A negative result does not preclude SARS-CoV-2 infection and should not be used as the sole basis for treatment or other patient management decisions.  A negative result may occur with improper specimen collection / handling, submission of specimen other than nasopharyngeal swab, presence of viral mutation(s) within the areas targeted by this assay, and inadequate number of viral copies (<250 copies / mL). A negative result must be combined with clinical observations, patient history, and epidemiological information. Fact Sheet for Patients:   StrictlyIdeas.no Fact Sheet for Healthcare Providers: BankingDealers.co.za This test is not yet approved or cleared  by the [place] FDA and has been authorized for detection and/or diagnosis of SARS-CoV-2 by FDA under an Emergency Use Authorization (EUA).  This EUA will remain in effect (meaning this test can be used) for the duration of the COVID-19 declaration under Section 564(b)(1) of the Act, 21 U.S.C. section 360bbb-3(b)(1), unless the authorization is  terminated or revoked sooner. Performed at [facility], [place] [address], [place], [place] [address]     FURTHER DISCHARGE INSTRUCTIONS:  Get Medicines reviewed and adjusted: Please take all your medications with you for your next visit with your Primary MD  Laboratory/radiological data: Please request your Primary MD to go over all hospital tests and procedure/radiological results at the follow up, please ask your Primary MD to get all Hospital records sent to his/her office.  In some cases, they will be blood work, cultures and biopsy results pending at the time of your discharge. Please request that your primary care M.D. goes through all the records of your hospital data and follows up on these results.  Also Note the following: If you experience worsening of your admission symptoms, develop shortness of breath, life threatening emergency, suicidal or homicidal thoughts you must seek medical attention immediately by calling 911 or calling your MD immediately  if symptoms less severe.  You must read  complete instructions/literature along with all the possible adverse reactions/side effects for all the Medicines you take and that have been prescribed to you. Take any new Medicines after you have completely understood and accpet all the possible adverse reactions/side effects.   Do not drive when taking Pain medications or sleeping medications (Benzodaizepines)  Do not take more than prescribed Pain, Sleep and Anxiety Medications. It is not advisable to combine anxiety,sleep and pain medications without talking with your primary care practitioner  Special Instructions: If you have smoked or chewed Tobacco  in the last 2 yrs please stop smoking, stop any regular Alcohol  and or any Recreational drug use.  Wear Seat belts while driving.  Please note: You were cared for by a hospitalist during your hospital stay. Once you are discharged, your primary care physician will handle any  further medical issues. Please note that NO REFILLS for any discharge medications will be authorized once you are discharged, as it is imperative that you return to your primary care physician (or establish a relationship with a primary care physician if you do not have one) for your post hospital discharge needs so that they can reassess your need for medications and monitor your lab values.  Total Time spent coordinating discharge including counseling, education and face to face time equals 35 minutes.  SignedOren Binet 04/20/2020 5:21 PM

## 2020-04-20 NOTE — Progress Notes (Signed)
[name] Arn, MD  Physician  Physical Medicine and Rehabilitation  Consult Note      Signed  Date of Service:  04/19/2020  5:08 AM      Related encounter: ED to Hosp-Admission (Discharged) from 04/17/2020 in [facility]      Larned All            Physical Medicine and Rehabilitation Consult Reason for Consult: Right side weakness and dysarthria Referring Physician: [facility]     HPI: [name] is a 59 y.o. right-handed male with history of CVA with residual right-sided weakness maintained on aspirin and Plavix, chronic combined systolic diastolic congestive heart failure, type 2 diabetes mellitus, hypertension, hyperlipidemia, schizoaffective disorder as well as history of polysubstance/alcohol/tobacco abuse.  History taken from chart review and patient.  Patient lives with his children.  1 level home 3 steps to entry.  Ambulated modified independent with rolling walker prior to admission.  Family assistance as needed.  He presented on 04/17/2020 with increasing right hemiparesis and dysarthria.  MRI identified small acute infarct of the left pons.  No hemorrhage or mass-effect.  Moderate chronic ischemic microangiopathy.  Patient did not receive TPA.  MRA without emergent large vessel occlusion.  CT angiogram of head and neck advance intracranial atherosclerosis with high-grade narrowing at the supraclinoid right ICA, left A1 origin, bilateral pericallosal arteries and bilateral PCA branches.  Echocardiogram with ejection fraction of 50%.  The left ventricle demonstrated global hypokinesis..  Admission chemistries BUN 23, creatinine 1.40, urine drug screen negative alcohol negative.  Neurology follow-up currently patient remains on aspirin and Plavix therapy for CVA prophylaxis.  Subcutaneous Lovenox for DVT prophylaxis.  Hospital course further complicated by dysphagia, started on dysphagia #2 thin liquid diet.  Therapy evaluations  completed with recommendations of physical medicine rehab consult.   Review of Systems  Constitutional: Negative for chills and fever.  HENT: Negative for hearing loss.   Eyes: Negative for blurred vision and double vision.  Respiratory: Negative for cough and shortness of breath.   Cardiovascular: Negative for chest pain and leg swelling.  Gastrointestinal: Negative for constipation, heartburn, nausea and vomiting.  Genitourinary: Negative for dysuria, flank pain and hematuria.  Musculoskeletal: Positive for myalgias.  Skin: Negative for rash.  Neurological: Positive for speech change, focal weakness and weakness. Negative for sensory change.  Psychiatric/Behavioral: Positive for depression. The patient has insomnia.   All other systems reviewed and are negative.       Past Medical History:  Diagnosis Date  . Alcohol abuse    . Arthritis    . Crack cocaine use    . Depression    . Diabetes mellitus    . Drug abuse ([place])    . DTs (delirium tremens) ([place])      history of   . Gout    . Noncompliance with medication regimen    . Schizophrenia ([place])    . Stroke ([place])    . Systolic heart failure secondary to hypertension ([place])    . Uncontrolled hypertension           Past Surgical History:  Procedure Laterality Date  . LEFT HEART CATH AND CORONARY ANGIOGRAPHY N/A 02/03/2018    Procedure: LEFT HEART CATH AND CORONARY ANGIOGRAPHY;  Surgeon: Jettie Booze, MD;  Location: [facility];  Service: Cardiovascular;  Laterality: N/A;  . MANDIBLE RECONSTRUCTION      . TONSILLECTOMY  Family History  Problem Relation Age of Onset  . Hypertension Other      Social History:  reports that he has been smoking cigarettes. He has been smoking about 0.25 packs per day. He has never used smokeless tobacco. He reports current alcohol use of about 1.0 - 2.0 standard drinks of alcohol per week. He reports current drug use. Drugs: "Crack" cocaine and Cocaine. Allergies: No  Known Allergies       Medications Prior to Admission  Medication Sig Dispense Refill  . acetaminophen (TYLENOL) 325 MG tablet Take 2 tablets (650 mg total) by mouth every 6 (six) hours as needed for mild pain (or Fever >/= 101). 30 tablet 0  . amLODipine (NORVASC) 10 MG tablet Take 1 tablet (10 mg total) by mouth daily. 30 tablet 0  . aspirin EC 81 MG tablet Take 81 mg by mouth daily.      . carvedilol (COREG) 25 MG tablet Take 1 tablet (25 mg total) by mouth 2 (two) times daily with a meal. 60 tablet 0  . diclofenac Sodium (VOLTAREN) 1 % GEL Apply 2 g topically 4 (four) times daily. 50 g 0  . ergocalciferol (VITAMIN D2) 1.25 MG (50000 UT) capsule Take 1 capsule by mouth once a week.      Marland Kitchen FLUoxetine (PROZAC) 10 MG capsule Take 10 mg by mouth daily.      . folic acid (FOLVITE) 1 MG tablet Take 1 mg by mouth daily.      Marland Kitchen gabapentin (NEURONTIN) 300 MG capsule Take 1 capsule (300 mg total) by mouth 3 (three) times daily. 90 capsule 0  . hydrALAZINE (APRESOLINE) 25 MG tablet Take 1 tablet (25 mg total) by mouth 3 (three) times daily. 90 tablet 0  . hydrOXYzine (ATARAX/VISTARIL) 25 MG tablet Take 25 mg by mouth 2 (two) times daily.      Marland Kitchen losartan (COZAAR) 50 MG tablet Take 1 tablet (50 mg total) by mouth daily. 30 tablet 0  . metFORMIN (GLUCOPHAGE) 500 MG tablet Take 1 tablet (500 mg total) by mouth 2 (two) times daily with a meal. 60 tablet 0  . mirtazapine (REMERON) 7.5 MG tablet Take 7.5 mg by mouth at bedtime.      . Multiple Vitamin (DAILY-VITE) TABS Take 1 tablet by mouth daily.      . paliperidone (INVEGA) 3 MG 24 hr tablet Take 1 tablet (3 mg total) by mouth daily. (Patient taking differently: Take 6 mg by mouth daily. ) 30 tablet 0  . pantoprazole (PROTONIX) 20 MG tablet Take 20 mg by mouth daily.      . SENNA PLUS 8.6-50 MG tablet Take 1 tablet by mouth daily.      . traMADol (ULTRAM) 50 MG tablet Take 50 mg by mouth every 6 (six) hours as needed for moderate pain.       Marland Kitchen clopidogrel  (PLAVIX) 75 MG tablet Take 1 tablet (75 mg total) by mouth daily. (Patient not taking: Reported on 04/18/2020) 30 tablet 0  . DULoxetine (CYMBALTA) 30 MG capsule Take 1 capsule (30 mg total) by mouth 2 (two) times daily. (Patient not taking: Reported on 04/18/2020) 60 capsule 0  . ondansetron (ZOFRAN) 4 MG tablet Take 1 tablet (4 mg total) by mouth every 6 (six) hours as needed for nausea. (Patient not taking: Reported on 04/18/2020) 20 tablet 0      Home: Home Living Family/patient expects to be discharged to:: Private residence Living Arrangements: Children Available Help at Discharge: Family, Available  24 hours/day Type of Home: House Home Access: Stairs to enter [employer] of Steps: 3 Entrance Stairs-Rails: None Home Layout: One level Bathroom Shower/Tub: Chiropodist: Standard Home Equipment: Environmental consultant - 2 wheels  Functional History: Prior Function Level of Independence: Needs assistance Gait / Transfers Assistance Needed: amb modified independent with rolling walker Comments: Uses RW Functional Status:  Mobility: Bed Mobility Overal bed mobility: Needs Assistance Bed Mobility: Sit to Supine Sidelying to sit: Mod assist Sit to supine: Mod assist General bed mobility comments: Assist bringing legs up  Transfers Overall transfer level: Needs assistance Equipment used: None Transfer via Lift Equipment: Stedy Transfers: Sit to/from Stand, Risk manager Sit to Stand: Max assist Stand pivot transfers: Max assist General transfer comment: face to face stand and transfer.  Patients legs buckling slightly and patient needed assist with moving R leg back one step for pivot. Ambulation/Gait General Gait Details: unable to attempt   ADL: ADL Overall ADL's : Needs assistance/impaired Eating/Feeding: Sitting, Minimal assistance Grooming: Minimal assistance, Sitting, Oral care Upper Body Bathing: Minimal assistance, Sitting Lower Body Bathing:  Moderate assistance, Sitting/lateral leans Upper Body Dressing : Minimal assistance, Sitting Lower Body Dressing: Moderate assistance, Sitting/lateral leans Toilet Transfer: Maximal assistance, Stand-pivot Functional mobility during ADLs: Maximal assistance   Cognition: Cognition Overall Cognitive Status: No family/caregiver present to determine baseline cognitive functioning Orientation Level: Oriented X4 Cognition Arousal/Alertness: Awake/alert Behavior During Therapy: WFL for tasks assessed/performed Overall Cognitive Status: No family/caregiver present to determine baseline cognitive functioning General Comments: Followed commands, becoms a little frustrated when people do not understand what he is trying to say.  Not oriented to year/day but knows month.   Blood pressure (!) 151/116, pulse 98, temperature 98.3 F (36.8 C), resp. rate 18, height 5\' 11"  (1.803 m), weight 100.3 kg, SpO2 100 %. Physical Exam  Vitals reviewed. Constitutional: He appears well-developed and well-nourished.  HENT:  Head: Normocephalic and atraumatic.  Eyes: EOM are normal. Right eye exhibits no discharge. Left eye exhibits no discharge.  Neck: No tracheal deviation present. No thyromegaly present.  Respiratory: Effort normal. No stridor. No respiratory distress.  GI:  Slightly firm Distended  Musculoskeletal:     Comments: No edema or tenderness in extremities  Neurological: He is alert.  Patient is alert in no acute distress.   Dysarthria Provides his name and age.   Follows simple commands. Motor: RUE: 4-4+/5 proximal and distal (baseline) RLE: 4/5 proximal distal (baseline) LUE/LLE: 5/5 proximal distal  Skin: Skin is warm and dry.  Psychiatric: He has a normal mood and affect. His behavior is normal. Thought content normal.      Lab Results Last 24 Hours       Results for orders placed or performed during the hospital encounter of 04/17/20 (from the past 24 hour(s))  Glucose, capillary      Status: Abnormal    Collection Time: 04/18/20  4:02 PM  Result Value Ref Range    Glucose-Capillary 128 (H) 70 - 99 mg/dL  Glucose, capillary     Status: Abnormal    Collection Time: 04/18/20  9:17 PM  Result Value Ref Range    Glucose-Capillary 178 (H) 70 - 99 mg/dL  Glucose, capillary     Status: Abnormal    Collection Time: 04/19/20  7:49 AM  Result Value Ref Range    Glucose-Capillary 185 (H) 70 - 99 mg/dL  Glucose, capillary     Status: Abnormal    Collection Time: 04/19/20 11:51 AM  Result Value  Ref Range    Glucose-Capillary 152 (H) 70 - 99 mg/dL       Imaging Results (Last 48 hours)  CT ANGIO HEAD W OR WO CONTRAST   Result Date: 04/18/2020 CLINICAL DATA:  Stroke follow-up EXAM: CT ANGIOGRAPHY HEAD AND NECK TECHNIQUE: Multidetector CT imaging of the head and neck was performed using the standard protocol during bolus administration of intravenous contrast. Multiplanar CT image reconstructions and MIPs were obtained to evaluate the vascular anatomy. Carotid stenosis measurements (when applicable) are obtained utilizing NASCET criteria, using the distal internal carotid diameter as the denominator. CONTRAST:  68mL OMNIPAQUE IOHEXOL 350 MG/ML SOLN COMPARISON:  Brain MRI and MRA from earlier today FINDINGS: CTA NECK FINDINGS Aortic arch: Atheromatous wall thickening.  No acute finding. Right carotid system: Tortuous with partial retropharyngeal course. Mild atheromatous changes at the bifurcation and distal common carotid. No flow limiting stenosis or ulceration. Left carotid system: Mild atheromatous plaque at the proximal common carotid. No stenosis or ulceration. Vertebral arteries: No proximal subclavian stenosis. The codominant vertebral arteries are smooth and widely patent to the dura. Skeleton: Advanced mid cervical disc degeneration with disc space collapse and endplate sclerosis. Other neck: No acute finding. Upper chest: No acute finding Review of the MIP images confirms the  above findings CTA HEAD FINDINGS Anterior circulation: Atheromatous plaque along the bilateral carotid siphons with 45% left periophthalmic ICA narrowing and 75% narrowing at the supraclinoid right ICA. Severe bilateral pericallosal stenosis with areas of imperceptible luminal enhancement. Severe left A1 origin stenosis. Accounting for atherosclerotic irregularity no aneurysm is seen. Posterior circulation: The vertebral and basilar arteries are smooth and widely patent. Advanced atheromatous type narrowing of bilateral P4 branches. Venous sinuses: Diffusely patent Anatomic variants: None significant Review of the MIP images confirms the above findings IMPRESSION: 1. Advanced intracranial atherosclerosis with high-grade narrowings at the supraclinoid right ICA, left A1 origin, bilateral pericallosal arteries, and bilateral PCA branches. 2. Cervical carotid atherosclerosis without flow limiting stenosis in the neck. Electronically Signed   By: Monte Fantasia M.D.   On: 04/18/2020 08:14    DG Chest 1 View   Result Date: 04/17/2020 CLINICAL DATA:  59 year old male with cough and chest pain. EXAM: CHEST  1 VIEW COMPARISON:  Chest radiograph dated 02/07/2020. FINDINGS: The heart size and mediastinal contours are within normal limits. Both lungs are clear. The visualized skeletal structures are unremarkable. IMPRESSION: No active disease. Electronically Signed   By: Anner Crete M.D.   On: 04/17/2020 22:36    CT ANGIO NECK W OR WO CONTRAST   Result Date: 04/18/2020 CLINICAL DATA:  Stroke follow-up EXAM: CT ANGIOGRAPHY HEAD AND NECK TECHNIQUE: Multidetector CT imaging of the head and neck was performed using the standard protocol during bolus administration of intravenous contrast. Multiplanar CT image reconstructions and MIPs were obtained to evaluate the vascular anatomy. Carotid stenosis measurements (when applicable) are obtained utilizing NASCET criteria, using the distal internal carotid diameter as the  denominator. CONTRAST:  6mL OMNIPAQUE IOHEXOL 350 MG/ML SOLN COMPARISON:  Brain MRI and MRA from earlier today FINDINGS: CTA NECK FINDINGS Aortic arch: Atheromatous wall thickening.  No acute finding. Right carotid system: Tortuous with partial retropharyngeal course. Mild atheromatous changes at the bifurcation and distal common carotid. No flow limiting stenosis or ulceration. Left carotid system: Mild atheromatous plaque at the proximal common carotid. No stenosis or ulceration. Vertebral arteries: No proximal subclavian stenosis. The codominant vertebral arteries are smooth and widely patent to the dura. Skeleton: Advanced mid cervical disc degeneration with  disc space collapse and endplate sclerosis. Other neck: No acute finding. Upper chest: No acute finding Review of the MIP images confirms the above findings CTA HEAD FINDINGS Anterior circulation: Atheromatous plaque along the bilateral carotid siphons with 45% left periophthalmic ICA narrowing and 75% narrowing at the supraclinoid right ICA. Severe bilateral pericallosal stenosis with areas of imperceptible luminal enhancement. Severe left A1 origin stenosis. Accounting for atherosclerotic irregularity no aneurysm is seen. Posterior circulation: The vertebral and basilar arteries are smooth and widely patent. Advanced atheromatous type narrowing of bilateral P4 branches. Venous sinuses: Diffusely patent Anatomic variants: None significant Review of the MIP images confirms the above findings IMPRESSION: 1. Advanced intracranial atherosclerosis with high-grade narrowings at the supraclinoid right ICA, left A1 origin, bilateral pericallosal arteries, and bilateral PCA branches. 2. Cervical carotid atherosclerosis without flow limiting stenosis in the neck. Electronically Signed   By: Monte Fantasia M.D.   On: 04/18/2020 08:14    MR ANGIO HEAD WO CONTRAST   Result Date: 04/18/2020 CLINICAL DATA:  Pontine infarct EXAM: MRA HEAD WITHOUT CONTRAST TECHNIQUE:  Angiographic images of the Circle of Willis were obtained using MRA technique without intravenous contrast. COMPARISON:  Brain MRI 04/18/2020 FINDINGS: Examination is degraded mildly by motion. POSTERIOR CIRCULATION: --Basilar artery: Normal. --Superior cerebellar arteries: Normal. --Posterior cerebral arteries: Severe stenosis of the right PCA proximal P3 segment. Normal left PCA. ANTERIOR CIRCULATION: --Intracranial internal carotid arteries: Normal. --Anterior cerebral arteries (ACA): Normal. Both A1 segments are present. Patent anterior communicating artery (a-comm). --Middle cerebral arteries (MCA): Normal. IMPRESSION: Motion degraded study without emergent large vessel occlusion. Electronically Signed   By: Ulyses Jarred M.D.   On: 04/18/2020 03:54    MR BRAIN WO CONTRAST   Result Date: 04/18/2020 CLINICAL DATA:  Stroke follow-up EXAM: MRI HEAD WITHOUT CONTRAST TECHNIQUE: Multiplanar, multiecho pulse sequences of the brain and surrounding structures were obtained without intravenous contrast. COMPARISON:  None. FINDINGS: Brain: There is a small acute infarct of the left pons. There is a punctate focus of DWI hyperintensity in the right frontal white matter without a clear correlate on ADC. Early confluent hyperintense T2-weighted signal of the periventricular and deep white matter, most commonly due to chronic ischemic microangiopathy. Old bilateral basal ganglia small vessel infarcts. Multifocal chronic microhemorrhage within the cerebellum and both cerebral hemispheres. There are 5-7 foci total. Normal midline structures. Vascular: Normal flow voids. Skull and upper cervical spine: Normal marrow signal. Sinuses/Orbits: Negative. Other: None. IMPRESSION: 1. Small acute infarct of the left pons. No hemorrhage or mass effect. 2. Punctate focus of DWI hyperintensity in the right frontal white matter without a clear correlate on ADC. This may indicate a small acute or subacute infarct. 3. Moderate chronic  ischemic microangiopathy. 4. Multifocal chronic microhemorrhage within the cerebellum and both cerebral hemispheres. Electronically Signed   By: Ulyses Jarred M.D.   On: 04/18/2020 03:21    ECHOCARDIOGRAM COMPLETE   Result Date: 04/18/2020    ECHOCARDIOGRAM REPORT   Patient Name:   TEJA JUDICE Date of Exam: 04/18/2020 Medical Rec #:  [id number]            Height:       71.0 in Accession #:    [id number]           Weight:       221.1 lb Date of Birth:  09-08-1961           BSA:          2.200 m Patient Age:    4  years             BP:           152/113 mmHg Patient Gender: M                    HR:           81 bpm. Exam Location:  Inpatient Procedure: 2D Echo, Cardiac Doppler and Color Doppler Indications:    Stroke 434.91 / I163.9  History:        Patient has prior history of Echocardiogram examinations, most                 recent 02/01/2018. CHF; Risk Factors:Hypertension and Diabetes.                 Alcohol abuse. Cocaine abuse.  Sonographer:    Jonelle Sidle Dance Referring Phys: [id number] [place]  1. Left ventricular ejection fraction, by estimation, is 45 to 50%. The left ventricle has mildly decreased function. The left ventricle demonstrates global hypokinesis. Left ventricular diastolic parameters are indeterminate.  2. Right ventricular systolic function is normal. The right ventricular size is normal.  3. The mitral valve is normal in structure. Trivial mitral valve regurgitation. No evidence of mitral stenosis.  4. Unable to determine valve morphology due to image quality. Aortic valve regurgitation is not visualized. No aortic stenosis is present.  5. Aortic dilatation noted. 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  6. The inferior vena cava is dilated in size with >50% respiratory variability, suggesting right atrial pressure of 8 mmHg. FINDINGS  Left Ventricle: Left ventricular ejection fraction, by estimation, is 45 to 50%. The left ventricle has mildly  decreased function. The left ventricle demonstrates global hypokinesis. The left ventricular internal cavity size was normal in size. There is  borderline left ventricular hypertrophy. Left ventricular diastolic parameters are indeterminate. Right Ventricle: The right ventricular size is normal. No increase in right ventricular wall thickness. Right ventricular systolic function is normal. Left Atrium: Left atrial size was normal in size. Right Atrium: Right atrial size was normal in size. Pericardium: There is no evidence of pericardial effusion. Mitral Valve: The mitral valve is normal in structure. Trivial mitral valve regurgitation. No evidence of mitral valve stenosis. Tricuspid Valve: The tricuspid valve is normal in structure. Tricuspid valve regurgitation is trivial. Aortic Valve: Unable to determine valve morphology due to image quality. Aortic valve regurgitation is not visualized. No aortic stenosis is present. Pulmonic Valve: The pulmonic valve was grossly normal. Pulmonic valve regurgitation is not visualized. No evidence of pulmonic stenosis. Aorta: Aortic dilatation noted. 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 Venous: The inferior vena cava is dilated in size with greater than 50% respiratory variability, suggesting right atrial pressure of 8 mmHg. IAS/Shunts: No atrial level shunt detected by color flow Doppler.  LEFT VENTRICLE PLAX 2D LVIDd:         5.40 cm  Diastology LVIDs:         4.10 cm  LV e' lateral:   8.38 cm/s LV PW:         1.00 cm  LV E/e' lateral: 10.8 LV IVS:        1.30 cm  LV e' medial:    5.22 cm/s LVOT diam:     2.30 cm  LV E/e' medial:  17.3 LV SV:         74 LV SV Index:  33 LVOT Area:     4.15 cm  RIGHT VENTRICLE             IVC RV Basal diam:  2.30 cm     IVC diam: 2.20 cm RV S prime:     14.50 cm/s TAPSE (M-mode): 1.8 cm LEFT ATRIUM             Index LA diam:        3.60 cm 1.64 cm/m LA Vol (A2C):   63.9 ml 29.04 ml/m LA Vol (A4C):   40.7 ml 18.50  ml/m LA Biplane Vol: 52.8 ml 23.99 ml/m  AORTIC VALVE LVOT Vmax:   90.20 cm/s LVOT Vmean:  59.700 cm/s LVOT VTI:    0.177 m  AORTA Ao Root diam: 4.10 cm Ao Asc diam:  4.00 cm MITRAL VALVE MV Area (PHT): 5.01 cm    SHUNTS MV Decel Time: 152 msec    Systemic VTI:  0.18 m MV E velocity: 90.50 cm/s  Systemic Diam: 2.30 cm Buford Dresser MD Electronically signed by Buford Dresser MD Signature Date/Time: 04/18/2020/10:21:13 PM    Final     CT HEAD CODE STROKE WO CONTRAST   Result Date: 04/17/2020 CLINICAL DATA:  Code stroke.  Right-sided facial droop EXAM: CT HEAD WITHOUT CONTRAST TECHNIQUE: Contiguous axial images were obtained from the base of the skull through the vertex without intravenous contrast. COMPARISON:  03/03/2018 FINDINGS: Brain: There is no mass, hemorrhage or extra-axial collection. The size and configuration of the ventricles and extra-axial CSF spaces are normal. There is hypoattenuation of the periventricular white matter, most commonly indicating chronic ischemic microangiopathy. There are small vessel infarcts of the left basal ganglia that are likely chronic. Vascular: No abnormal hyperdensity of the major intracranial arteries or dural venous sinuses. No intracranial atherosclerosis. Skull: The visualized skull base, calvarium and extracranial soft tissues are normal. Sinuses/Orbits: No fluid levels or advanced mucosal thickening of the visualized paranasal sinuses. No mastoid or middle ear effusion. The orbits are normal. ASPECTS [facility] Stroke Program Early CT Score) - Ganglionic level infarction (caudate, lentiform nuclei, internal capsule, insula, M1-M3 cortex): 7 - Supraganglionic infarction (M4-M6 cortex): 3 Total score (0-10 with 10 being normal): 10 IMPRESSION: 1. No acute intracranial abnormality. 2. ASPECTS is 10. 3. Chronic ischemic microangiopathy and small vessel infarcts of the left basal ganglia that are likely old. 4. These results were communicated to Dr. Amie Portland at 9:58 pm on 04/17/2020 by text page via the [facility] messaging system. Electronically Signed   By: Ulyses Jarred M.D.   On: 04/17/2020 21:58       Assessment/Plan: Diagnosis: Left pons infarct Stroke: Continue secondary stroke prophylaxis and Risk Factor Modification listed below:   Antiplatelet therapy:   Blood Pressure Management:  Continue current medication with prn's with permisive HTN per primary team Statin Agent:   Diabetes management:   Tobacco abuse:   Right sided hemiparesis:  PT/OT for mobility, ADL training  Labs independently reviewed.  Records reviewed and summated above.   1. Does the need for close, 24 hr/day medical supervision in concert with the patient's rehab needs make it unreasonable for this patient to be served in a less intensive setting? Yes  2. Co-Morbidities requiring supervision/potential complications: CVA with residual right-sided weakness, chronic combined systolic diastolic congestive heart failure (Daily weights, monitor for signs and symptoms of fluid overload), type 2 DM (Monitor in accordance with exercise and adjust meds as necessary), HTN (monitor and provide prns in accordance with increased physical  exertion and pain), hyperlipidemia, schizoaffective disorder, polysubstance/alcohol/tobacco abuse (counsel), hypokalemia (continue to monitor and replete as necessary) 3. Due to safety, disease management and patient education, does the patient require 24 hr/day rehab nursing? Yes 4. Does the patient require coordinated care of a physician, rehab nurse, therapy disciplines of PT/OT/SLP to address physical and functional deficits in the context of the above medical diagnosis(es)? Yes Addressing deficits in the following areas: balance, endurance, locomotion, strength, transferring, bathing, dressing, toileting, speech, swallowing and psychosocial support 5. Can the patient actively participate in an intensive therapy program of at least 3 hrs of therapy  per day at least 5 days per week? Yes 6. The potential for patient to make measurable gains while on inpatient rehab is excellent 7. Anticipated functional outcomes upon discharge from inpatient rehab are modified independent and supervision  with PT, modified independent and supervision with OT, modified independent with SLP. 8. Estimated rehab length of stay to reach the above functional goals is: 12-15 days. 9. Anticipated discharge destination: Home 10. Overall Rehab/Functional Prognosis: good   RECOMMENDATIONS: This patient's condition is appropriate for continued rehabilitative care in the following setting: CIR patient does not advance to supervision level of functioning. Patient has agreed to participate in recommended program. Yes Note that insurance prior authorization may be required for reimbursement for recommended care.   Comment: Rehab Admissions Coordinator to follow up.   I have personally performed a face to face diagnostic evaluation, including, but not limited to relevant history and physical exam findings, of this patient and developed relevant assessment and plan.  Additionally, I have reviewed and concur with the physician assistant's documentation above.    Delice Lesch, MD, ABPMR Lavon Paganini Angiulli, PA-C 04/19/2020        Revision History                     Routing History

## 2020-04-20 NOTE — Progress Notes (Signed)
Notified Linna Hoff A.. PA) on call r/t current b/p values, no new orders

## 2020-04-20 NOTE — Progress Notes (Signed)
  Speech Language Pathology Treatment: Dysphagia  Patient Details Name: [name] MRN: [id number] DOB: Sep 06, 1961 Today's Date: 04/20/2020 Time: 1040-1100 SLP Time Calculation (min) (ACUTE ONLY): 20 min  Assessment / Plan / Recommendation Clinical Impression  Pt seen well after am meal. When asked how it was he reports "Its all over my shirt." He also may have vomited or coughed up much of it. Pt was still spitting out whole pieces of egg and potato during the session. Given his ongoing struggle with solids, recommend downgrade to puree. Pt agreeable at this point, but his opinion may change when he sees his meals. Pt tolerating sips of thin liquids with an initial verbal cue to take small single sips.   HPI HPI: [name] is a 59 y.o. male with medical history significant for history of stroke with residual right-sided weakness, neurocognitive impairment, chronic combined systolic and diastolic CHF, type 2 diabetes, hypertension, hyperlipidemia, schizoaffective disorder, chronic left hip pain, and history of polysubstance use disorder admitted with with worsening right-sided weakness and dysarthria from his baseline. MRI small acute infarct of the left pons. No hemorrhage or mass effect, multifocal chronic microhemorrhage within the cerebellum and both cerebral hemispheres.      SLP Plan  Continue with current plan of care       Recommendations  Diet recommendations: Dysphagia 1 (puree);Thin liquid Liquids provided via: Cup;No straw Medication Administration: Crushed with puree Supervision: Patient able to self feed;Full supervision/cueing for compensatory strategies Compensations: Slow rate;Small sips/bites Postural Changes and/or Swallow Maneuvers: Seated upright 90 degrees                General recommendations: Rehab consult Oral Care Recommendations: Oral care before and after PO Follow up Recommendations: Inpatient Rehab SLP Visit Diagnosis: Dysphagia,  oropharyngeal phase (R13.12) Plan: Continue with current plan of care       GO                [name], Katherene Ponto 04/20/2020, 11:24 AM

## 2020-04-20 NOTE — Discharge Summary (Signed)
PATIENT DETAILS Name: [name] Age: 59 y.o. Sex: male Date of Birth: April 29, 1961 MRN: [id number]. Admitting Physician: [name] Osgood, MD JEH:UDJS, [place] Breeze, FNP  Admit Date: 04/17/2020 Discharge date: 04/20/2020  Recommendations for Outpatient Follow-up:  1. Follow up with PCP in 1-2 weeks 2. Please obtain CMP/CBC in one week 3. [name]/[name] for 3 months followed by [name] alone 4. Please ensure follow-up at stroke clinic. 5. Initial antihypertensives were held and permissive hypertension was allowed-resuming Coreg-please resume amlodipine, hydralazine, losartan over the next few days.  Admitted From:  Home  Disposition: [place]: No  Equipment/Devices: None  Discharge Condition: Stable  CODE STATUS: FULL CODE  Diet recommendation:  Diet Order            DIET - DYS 1 Room service appropriate? Yes; Fluid consistency: Thin  Diet effective now        Diet - low sodium heart healthy        Diet Carb Modified               Brief Narrative: Patient is a 59 y.o. male with prior history of CVA with chronic residual right-sided weakness, chronic combined systolic/diastolic heart failure, DM-2, HTN, HLD, schizoaffective disorder who presented with worsening right-sided weakness and dysarthria.  He was subsequently admitted for further evaluation and treatment.  Significant events: 5/31>> admit for evaluation of worsening right-sided weakness and dysarthria (more than usual baseline)  Significant studies: 6/1>> CT angio head/neck: Advanced intracranial atherosclerosis with high-grade narrowings at the supraclinoid right ICA, left A1 origin, bilateral pericallosal arteries, bilateral PCA branches, cervical carotid atherosclerosis without flow-limiting stenosis. 6/1>> MRI brain: Small infarct left pons, punctate focus of DWI intensity in the right frontal white matter. 6/1>> Echo: EF 45-50% 5/31>> CT head: No acute intracranial  abnormality 5/31>> chest x-ray: No active disease.  Antimicrobial therapy: None  Microbiology data: None  Procedures : None  Consults: Neurology  Brief Hospital Course: Acute CVA: Likely secondary to small vessel disease.  Continues to have some dysarthria and right-sided weakness more than usual baseline.-not sure if this is his baseline.  Imaging studies as noted above.  A1c 7.7, LDL 114-echo with slightly reduced EF but without any embolic source.  Continue dual antiplatelet agents with [name]/[name] for 3 months, then [name] alone.  Continue statin.    AKI: Appears to be mild-likely hemodynamically mediated-resolved.  Repeat electrolytes periodically.  Hypertension:allow permissive HTN-given acute CVA.  Continue to hold all antihypertensive agents.  Chronic combined systolic and diastolic CHF (EF 97-02% by TTE 02/01/2018): Euvolemic on exam-follow closely.   DM-2 (A1c 7.7 on 6/1): CBGs stable on SSI-should be okay to resume [name] on discharge.   Hyperlipidemia: Continue statin  Schizoaffective disorder: Stable-continue [name] and [name]  History of polysubstance use disorder:Patient denies any recent illicit drug, alcohol, or tobacco use. UDS and EtOH levels are negative  Obesity: Estimated body mass index is 30.84 kg/m as calculated from the following:   Height as of this encounter: 5\' 11"  (1.803 m).   Weight as of this encounter: 100.3 kg.    Discharge Diagnoses:  Principal Problem:   Right sided weakness Active Problems:   Diabetes mellitus ([place])   Hypertension associated with diabetes ([place])   Hyperlipidemia associated with type 2 diabetes mellitus (HCC)   Schizoaffective disorder, bipolar type ([place])   AKI (acute kidney injury) ([place])   History of CVA with residual deficit   Chronic combined systolic (congestive) and diastolic (congestive) heart failure ([place])  Substance use disorder   CVA (cerebral vascular accident) [name]   Discharge  Instructions:  Activity:  As tolerated with Full fall precautions use walker/cane & assistance as needed   Discharge Instructions    Ambulatory referral to Neurology   Complete by: As directed    Follow up with stroke clinic NP ([name] or [name], if both not available, consider [name], or [name]) at [facility] in about 4 weeks. Thanks.   Diet - low sodium heart healthy   Complete by: As directed    Diet Carb Modified   Complete by: As directed    Discharge instructions   Complete by: As directed    Follow with Primary MD  [name] Medici, FNP in 1-2 weeks  Follow with stroke clinic in 1 week  Take [name] along with [name] for 3 months, then after 3 months stop [name] and continue on [name]  Please get a complete blood count and chemistry panel checked by your Primary MD at your next visit, and again as instructed by your Primary MD.  Get Medicines reviewed and adjusted: Please take all your medications with you for your next visit with your Primary MD  Laboratory/radiological data: Please request your Primary MD to go over all hospital tests and procedure/radiological results at the follow up, please ask your Primary MD to get all Hospital records sent to his/her office.  In some cases, they will be blood work, cultures and biopsy results pending at the time of your discharge. Please request that your primary care M.D. follows up on these results.  Also Note the following: If you experience worsening of your admission symptoms, develop shortness of breath, life threatening emergency, suicidal or homicidal thoughts you must seek medical attention immediately by calling 911 or calling your MD immediately  if symptoms less severe.  You must read complete instructions/literature along with all the possible adverse reactions/side effects for all the Medicines you take and that have been prescribed to you. Take any new Medicines after you have completely understood  and accpet all the possible adverse reactions/side effects.   Do not drive when taking Pain medications or sleeping medications (Benzodaizepines)  Do not take more than prescribed Pain, Sleep and Anxiety Medications. It is not advisable to combine anxiety,sleep and pain medications without talking with your primary care practitioner  Special Instructions: If you have smoked or chewed Tobacco  in the last 2 yrs please stop smoking, stop any regular Alcohol  and or any Recreational drug use.  Wear Seat belts while driving.  Please note: You were cared for by a hospitalist during your hospital stay. Once you are discharged, your primary care physician will handle any further medical issues. Please note that NO REFILLS for any discharge medications will be authorized once you are discharged, as it is imperative that you return to your primary care physician (or establish a relationship with a primary care physician if you do not have one) for your post hospital discharge needs so that they can reassess your need for medications and monitor your lab values.   Increase activity slowly   Complete by: As directed      Allergies as of 04/20/2020   No Known Allergies     Medication List    STOP taking these medications   amLODipine 10 MG tablet Commonly known as: NORVASC   hydrALAZINE 25 MG tablet Commonly known as: APRESOLINE   losartan 50 MG tablet Commonly known as: COZAAR   ondansetron 4  MG tablet Commonly known as: ZOFRAN     TAKE these medications   acetaminophen 325 MG tablet Commonly known as: TYLENOL Take 2 tablets (650 mg total) by mouth every 6 (six) hours as needed for mild pain (or Fever >/= 101).   [name] EC 81 MG tablet Take 81 mg by mouth daily.   atorvastatin 40 MG tablet Commonly known as: LIPITOR Take 1 tablet (40 mg total) by mouth daily. Start taking on: April 21, 2020   carvedilol 25 MG tablet Commonly known as: COREG Take 1 tablet (25 mg total) by mouth 2  (two) times daily with a meal.   clopidogrel 75 MG tablet Commonly known as: [name] Take 1 tablet (75 mg total) by mouth daily.   Daily-Vite Tabs Take 1 tablet by mouth daily.   diclofenac Sodium 1 % Gel Commonly known as: VOLTAREN Apply 2 g topically 4 (four) times daily.   DULoxetine 30 MG capsule Commonly known as: [name] Take 1 capsule (30 mg total) by mouth 2 (two) times daily.   ergocalciferol 1.25 MG (50000 UT) capsule Commonly known as: VITAMIN D2 Take 1 capsule by mouth once a week.   FLUoxetine 10 MG capsule Commonly known as: PROZAC Take 10 mg by mouth daily.   folic acid 1 MG tablet Commonly known as: FOLVITE Take 1 mg by mouth daily.   gabapentin 300 MG capsule Commonly known as: NEURONTIN Take 1 capsule (300 mg total) by mouth 3 (three) times daily.   hydrOXYzine 25 MG tablet Commonly known as: ATARAX/VISTARIL Take 25 mg by mouth 2 (two) times daily.   [name] 500 MG tablet Commonly known as: GLUCOPHAGE Take 1 tablet (500 mg total) by mouth 2 (two) times daily with a meal.   mirtazapine 7.5 MG tablet Commonly known as: REMERON Take 7.5 mg by mouth at bedtime.   paliperidone 3 MG 24 hr tablet Commonly known as: [name] Take 1 tablet (3 mg total) by mouth daily. What changed: how much to take   pantoprazole 20 MG tablet Commonly known as: PROTONIX Take 20 mg by mouth daily.   Senna Plus 8.6-50 MG tablet Generic drug: senna-docusate Take 1 tablet by mouth daily.   traMADol 50 MG tablet Commonly known as: ULTRAM Take 50 mg by mouth every 6 (six) hours as needed for moderate pain.      Follow-up Information    [facility]. Schedule an appointment as soon as possible for a visit in 4 week(s).   Specialty: Neurology Contact information: [address] [place] [phone]       [name] Medici, FNP. Schedule an appointment as soon as possible for a visit in 1 week(s).   Specialty:  Nurse Practitioner Contact information: [place] [address] [phone]          No Known Allergies    Other Procedures/Studies: CT ANGIO HEAD W OR WO CONTRAST  Result Date: 04/18/2020 CLINICAL DATA:  Stroke follow-up EXAM: CT ANGIOGRAPHY HEAD AND NECK TECHNIQUE: Multidetector CT imaging of the head and neck was performed using the standard protocol during bolus administration of intravenous contrast. Multiplanar CT image reconstructions and MIPs were obtained to evaluate the vascular anatomy. Carotid stenosis measurements (when applicable) are obtained utilizing NASCET criteria, using the distal internal carotid diameter as the denominator. CONTRAST:  63mL OMNIPAQUE IOHEXOL 350 MG/ML SOLN COMPARISON:  Brain MRI and MRA from earlier today FINDINGS: CTA NECK FINDINGS Aortic arch: Atheromatous wall thickening.  No acute finding.  Right carotid system: Tortuous with partial retropharyngeal course. Mild atheromatous changes at the bifurcation and distal common carotid. No flow limiting stenosis or ulceration. Left carotid system: Mild atheromatous plaque at the proximal common carotid. No stenosis or ulceration. Vertebral arteries: No proximal subclavian stenosis. The codominant vertebral arteries are smooth and widely patent to the dura. Skeleton: Advanced mid cervical disc degeneration with disc space collapse and endplate sclerosis. Other neck: No acute finding. Upper chest: No acute finding Review of the MIP images confirms the above findings CTA HEAD FINDINGS Anterior circulation: Atheromatous plaque along the bilateral carotid siphons with 45% left periophthalmic ICA narrowing and 75% narrowing at the supraclinoid right ICA. Severe bilateral pericallosal stenosis with areas of imperceptible luminal enhancement. Severe left A1 origin stenosis. Accounting for atherosclerotic irregularity no aneurysm is seen. Posterior circulation: The vertebral and basilar arteries are smooth and  widely patent. Advanced atheromatous type narrowing of bilateral P4 branches. Venous sinuses: Diffusely patent Anatomic variants: None significant Review of the MIP images confirms the above findings IMPRESSION: 1. Advanced intracranial atherosclerosis with high-grade narrowings at the supraclinoid right ICA, left A1 origin, bilateral pericallosal arteries, and bilateral PCA branches. 2. Cervical carotid atherosclerosis without flow limiting stenosis in the neck. Electronically Signed   By: Monte Fantasia M.D.   On: 04/18/2020 08:14   DG Chest 1 View  Result Date: 04/17/2020 CLINICAL DATA:  59 year old male with cough and chest pain. EXAM: CHEST  1 VIEW COMPARISON:  Chest radiograph dated 02/07/2020. FINDINGS: The heart size and mediastinal contours are within normal limits. Both lungs are clear. The visualized skeletal structures are unremarkable. IMPRESSION: No active disease. Electronically Signed   By: Anner Crete M.D.   On: 04/17/2020 22:36   CT ANGIO NECK W OR WO CONTRAST  Result Date: 04/18/2020 CLINICAL DATA:  Stroke follow-up EXAM: CT ANGIOGRAPHY HEAD AND NECK TECHNIQUE: Multidetector CT imaging of the head and neck was performed using the standard protocol during bolus administration of intravenous contrast. Multiplanar CT image reconstructions and MIPs were obtained to evaluate the vascular anatomy. Carotid stenosis measurements (when applicable) are obtained utilizing NASCET criteria, using the distal internal carotid diameter as the denominator. CONTRAST:  21mL OMNIPAQUE IOHEXOL 350 MG/ML SOLN COMPARISON:  Brain MRI and MRA from earlier today FINDINGS: CTA NECK FINDINGS Aortic arch: Atheromatous wall thickening.  No acute finding. Right carotid system: Tortuous with partial retropharyngeal course. Mild atheromatous changes at the bifurcation and distal common carotid. No flow limiting stenosis or ulceration. Left carotid system: Mild atheromatous plaque at the proximal common carotid. No  stenosis or ulceration. Vertebral arteries: No proximal subclavian stenosis. The codominant vertebral arteries are smooth and widely patent to the dura. Skeleton: Advanced mid cervical disc degeneration with disc space collapse and endplate sclerosis. Other neck: No acute finding. Upper chest: No acute finding Review of the MIP images confirms the above findings CTA HEAD FINDINGS Anterior circulation: Atheromatous plaque along the bilateral carotid siphons with 45% left periophthalmic ICA narrowing and 75% narrowing at the supraclinoid right ICA. Severe bilateral pericallosal stenosis with areas of imperceptible luminal enhancement. Severe left A1 origin stenosis. Accounting for atherosclerotic irregularity no aneurysm is seen. Posterior circulation: The vertebral and basilar arteries are smooth and widely patent. Advanced atheromatous type narrowing of bilateral P4 branches. Venous sinuses: Diffusely patent Anatomic variants: None significant Review of the MIP images confirms the above findings IMPRESSION: 1. Advanced intracranial atherosclerosis with high-grade narrowings at the supraclinoid right ICA, left A1 origin, bilateral pericallosal arteries, and bilateral PCA branches.  2. Cervical carotid atherosclerosis without flow limiting stenosis in the neck. Electronically Signed   By: Monte Fantasia M.D.   On: 04/18/2020 08:14   MR ANGIO HEAD WO CONTRAST  Result Date: 04/18/2020 CLINICAL DATA:  [place] infarct EXAM: MRA HEAD WITHOUT CONTRAST TECHNIQUE: Angiographic images of the Circle of Willis were obtained using MRA technique without intravenous contrast. COMPARISON:  Brain MRI 04/18/2020 FINDINGS: Examination is degraded mildly by motion. POSTERIOR CIRCULATION: --Basilar artery: Normal. --Superior cerebellar arteries: Normal. --Posterior cerebral arteries: Severe stenosis of the right PCA proximal P3 segment. Normal left PCA. ANTERIOR CIRCULATION: --Intracranial internal carotid arteries: Normal. --Anterior  cerebral arteries (ACA): Normal. Both A1 segments are present. Patent anterior communicating artery (a-comm). --Middle cerebral arteries (MCA): Normal. IMPRESSION: Motion degraded study without emergent large vessel occlusion. Electronically Signed   By: Ulyses Jarred M.D.   On: 04/18/2020 03:54   MR BRAIN WO CONTRAST  Result Date: 04/18/2020 CLINICAL DATA:  Stroke follow-up EXAM: MRI HEAD WITHOUT CONTRAST TECHNIQUE: Multiplanar, multiecho pulse sequences of the brain and surrounding structures were obtained without intravenous contrast. COMPARISON:  None. FINDINGS: Brain: There is a small acute infarct of the left pons. There is a punctate focus of DWI hyperintensity in the right frontal white matter without a clear correlate on ADC. Early confluent hyperintense T2-weighted signal of the periventricular and deep white matter, most commonly due to chronic ischemic microangiopathy. Old bilateral basal ganglia small vessel infarcts. Multifocal chronic microhemorrhage within the cerebellum and both cerebral hemispheres. There are 5-7 foci total. Normal midline structures. Vascular: Normal flow voids. Skull and upper cervical spine: Normal marrow signal. Sinuses/Orbits: Negative. Other: None. IMPRESSION: 1. Small acute infarct of the left pons. No hemorrhage or mass effect. 2. Punctate focus of DWI hyperintensity in the right frontal white matter without a clear correlate on ADC. This may indicate a small acute or subacute infarct. 3. Moderate chronic ischemic microangiopathy. 4. Multifocal chronic microhemorrhage within the cerebellum and both cerebral hemispheres. Electronically Signed   By: Ulyses Jarred M.D.   On: 04/18/2020 03:21   ECHOCARDIOGRAM COMPLETE  Result Date: 04/18/2020    ECHOCARDIOGRAM REPORT   Patient Name:   ZAILEN ALBARRAN Date of Exam: 04/18/2020 Medical Rec #:  [id number]            Height:       71.0 in Accession #:    [id number]           Weight:       221.1 lb Date of Birth:  1961-03-17            BSA:          2.200 m Patient Age:    73 years             BP:           152/113 mmHg Patient Gender: M                    HR:           81 bpm. Exam Location:  Inpatient Procedure: 2D Echo, Cardiac Doppler and Color Doppler Indications:    Stroke 434.91 / I163.9  History:        Patient has prior history of Echocardiogram examinations, most                 recent 02/01/2018. CHF; Risk Factors:Hypertension and Diabetes.                 Alcohol abuse. Cocaine  abuse.  Sonographer:    Tiffany Dance Referring Phys: [id number] [place]  1. Left ventricular ejection fraction, by estimation, is 45 to 50%. The left ventricle has mildly decreased function. The left ventricle demonstrates global hypokinesis. Left ventricular diastolic parameters are indeterminate.  2. Right ventricular systolic function is normal. The right ventricular size is normal.  3. The mitral valve is normal in structure. Trivial mitral valve regurgitation. No evidence of mitral stenosis.  4. Unable to determine valve morphology due to image quality. Aortic valve regurgitation is not visualized. No aortic stenosis is present.  5. Aortic dilatation noted. 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  6. The inferior vena cava is dilated in size with >50% respiratory variability, suggesting right atrial pressure of 8 mmHg. FINDINGS  Left Ventricle: Left ventricular ejection fraction, by estimation, is 45 to 50%. The left ventricle has mildly decreased function. The left ventricle demonstrates global hypokinesis. The left ventricular internal cavity size was normal in size. There is  borderline left ventricular hypertrophy. Left ventricular diastolic parameters are indeterminate. Right Ventricle: The right ventricular size is normal. No increase in right ventricular wall thickness. Right ventricular systolic function is normal. Left Atrium: Left atrial size was normal in size. Right Atrium: Right atrial size was  normal in size. Pericardium: There is no evidence of pericardial effusion. Mitral Valve: The mitral valve is normal in structure. Trivial mitral valve regurgitation. No evidence of mitral valve stenosis. Tricuspid Valve: The tricuspid valve is normal in structure. Tricuspid valve regurgitation is trivial. Aortic Valve: Unable to determine valve morphology due to image quality. Aortic valve regurgitation is not visualized. No aortic stenosis is present. Pulmonic Valve: The pulmonic valve was grossly normal. Pulmonic valve regurgitation is not visualized. No evidence of pulmonic stenosis. Aorta: Aortic dilatation noted. 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 Venous: The inferior vena cava is dilated in size with greater than 50% respiratory variability, suggesting right atrial pressure of 8 mmHg. IAS/Shunts: No atrial level shunt detected by color flow Doppler.  LEFT VENTRICLE PLAX 2D LVIDd:         5.40 cm  Diastology LVIDs:         4.10 cm  LV e' lateral:   8.38 cm/s LV PW:         1.00 cm  LV E/e' lateral: 10.8 LV IVS:        1.30 cm  LV e' medial:    5.22 cm/s LVOT diam:     2.30 cm  LV E/e' medial:  17.3 LV SV:         74 LV SV Index:   33 LVOT Area:     4.15 cm  RIGHT VENTRICLE             IVC RV Basal diam:  2.30 cm     IVC diam: 2.20 cm RV S prime:     14.50 cm/s TAPSE (M-mode): 1.8 cm LEFT ATRIUM             Index LA diam:        3.60 cm 1.64 cm/m LA Vol (A2C):   63.9 ml 29.04 ml/m LA Vol (A4C):   40.7 ml 18.50 ml/m LA Biplane Vol: 52.8 ml 23.99 ml/m  AORTIC VALVE LVOT Vmax:   90.20 cm/s LVOT Vmean:  59.700 cm/s LVOT VTI:    0.177 m  AORTA Ao Root diam: 4.10 cm Ao Asc diam:  4.00 cm MITRAL  VALVE MV Area (PHT): 5.01 cm    SHUNTS MV Decel Time: 152 msec    Systemic VTI:  0.18 m MV E velocity: 90.50 cm/s  Systemic Diam: 2.30 cm Buford Dresser MD Electronically signed by Buford Dresser MD Signature Date/Time: 04/18/2020/10:21:13 PM    Final    CT HEAD CODE STROKE WO  CONTRAST  Result Date: 04/17/2020 CLINICAL DATA:  Code stroke.  Right-sided facial droop EXAM: CT HEAD WITHOUT CONTRAST TECHNIQUE: Contiguous axial images were obtained from the base of the skull through the vertex without intravenous contrast. COMPARISON:  03/03/2018 FINDINGS: Brain: There is no mass, hemorrhage or extra-axial collection. The size and configuration of the ventricles and extra-axial CSF spaces are normal. There is hypoattenuation of the periventricular white matter, most commonly indicating chronic ischemic microangiopathy. There are small vessel infarcts of the left basal ganglia that are likely chronic. Vascular: No abnormal hyperdensity of the major intracranial arteries or dural venous sinuses. No intracranial atherosclerosis. Skull: The visualized skull base, calvarium and extracranial soft tissues are normal. Sinuses/Orbits: No fluid levels or advanced mucosal thickening of the visualized paranasal sinuses. No mastoid or middle ear effusion. The orbits are normal. ASPECTS [facility] Stroke Program Early CT Score) - Ganglionic level infarction (caudate, lentiform nuclei, internal capsule, insula, M1-M3 cortex): 7 - Supraganglionic infarction (M4-M6 cortex): 3 Total score (0-10 with 10 being normal): 10 IMPRESSION: 1. No acute intracranial abnormality. 2. ASPECTS is 10. 3. Chronic ischemic microangiopathy and small vessel infarcts of the left basal ganglia that are likely old. 4. These results were communicated to Dr. Amie Portland at 9:58 pm on 04/17/2020 by text page via the [facility] messaging system. Electronically Signed   By: Ulyses Jarred M.D.   On: 04/17/2020 21:58     TODAY-DAY OF DISCHARGE:  Subjective:   [name] today has no headache,no chest abdominal pain,no new weakness tingling or numbness, feels much better wants to go home today.   Objective:   Blood pressure (!) 166/127, pulse 87, temperature 98 F (36.7 C), temperature source Oral, resp. rate 16, height 5'  11" (1.803 m), weight 100 kg, SpO2 99 %.  Intake/Output Summary (Last 24 hours) at 04/20/2020 1721 Last data filed at 04/20/2020 1400 Gross per 24 hour  Intake 1020 ml  Output 1750 ml  Net -730 ml   Filed Weights   04/17/20 2209 04/18/20 0141 04/20/20 0627  Weight: 101.1 kg 100.3 kg 100 kg    Exam: Awake Alert, Oriented *3, No new F.N deficits, Normal affect [place].AT,PERRAL Supple Neck,No JVD, No cervical lymphadenopathy appriciated.  Symmetrical Chest wall movement, Good air movement bilaterally, CTAB RRR,No Gallops,Rubs or new Murmurs, No Parasternal Heave +ve B.Sounds, Abd Soft, Non tender, No organomegaly appriciated, No rebound -guarding or rigidity. No Cyanosis, Clubbing or edema, No new Rash or bruise   PERTINENT RADIOLOGIC STUDIES: No results found.   PERTINENT LAB RESULTS: CBC: Recent Labs    04/17/20 2146 04/17/20 2150  WBC  --  7.7  HGB 12.9* 12.5*  HCT 38.0* 39.1  PLT  --  210   CMET CMP     Component Value Date/Time   NA 139 04/18/2020 0556   K 3.3 (L) 04/18/2020 0556   CL 101 04/18/2020 0556   CO2 25 04/18/2020 0556   GLUCOSE 167 (H) 04/18/2020 0556   BUN 15 04/18/2020 0556   CREATININE 1.19 04/18/2020 0556   CALCIUM 9.1 04/18/2020 0556   PROT 7.5 04/17/2020 2150   ALBUMIN 3.7 04/18/2020 0556   AST 21  04/17/2020 2150   ALT 11 04/17/2020 2150   ALKPHOS 75 04/17/2020 2150   BILITOT 0.8 04/17/2020 2150   GFRNONAA >60 04/18/2020 0556   GFRAA >60 04/18/2020 0556    GFR Estimated Creatinine Clearance: 82.5 mL/min (by C-G formula based on SCr of 1.19 mg/dL). No results for input(s): LIPASE, AMYLASE in the last 72 hours. No results for input(s): CKTOTAL, CKMB, CKMBINDEX, TROPONINI in the last 72 hours. Invalid input(s): POCBNP No results for input(s): DDIMER in the last 72 hours. Recent Labs    04/18/20 0556  HGBA1C 7.7*   Recent Labs    04/18/20 0556  CHOL 195  HDL 44  LDLCALC 114*  TRIG 183*  CHOLHDL 4.4   No results for input(s): TSH,  T4TOTAL, T3FREE, THYROIDAB in the last 72 hours.  Invalid input(s): FREET3 No results for input(s): VITAMINB12, FOLATE, FERRITIN, TIBC, IRON, RETICCTPCT in the last 72 hours. Coags: Recent Labs    04/17/20 2150  INR 0.9   Microbiology: Recent Results (from the past 240 hour(s))  SARS Coronavirus 2 by RT PCR (hospital order, performed in [facility] hospital lab) Nasopharyngeal Nasopharyngeal Swab     Status: None   Collection Time: 04/18/20 12:25 AM   Specimen: Nasopharyngeal Swab  Result Value Ref Range Status   SARS Coronavirus 2 NEGATIVE NEGATIVE Final    Comment: (NOTE) SARS-CoV-2 target nucleic acids are NOT DETECTED. The SARS-CoV-2 RNA is generally detectable in upper and lower respiratory specimens during the acute phase of infection. The lowest concentration of SARS-CoV-2 viral copies this assay can detect is 250 copies / mL. A negative result does not preclude SARS-CoV-2 infection and should not be used as the sole basis for treatment or other patient management decisions.  A negative result may occur with improper specimen collection / handling, submission of specimen other than nasopharyngeal swab, presence of viral mutation(s) within the areas targeted by this assay, and inadequate number of viral copies (<250 copies / mL). A negative result must be combined with clinical observations, patient history, and epidemiological information. Fact Sheet for Patients:   StrictlyIdeas.no Fact Sheet for Healthcare Providers: BankingDealers.co.za This test is not yet approved or cleared  by the [place] FDA and has been authorized for detection and/or diagnosis of SARS-CoV-2 by FDA under an Emergency Use Authorization (EUA).  This EUA will remain in effect (meaning this test can be used) for the duration of the COVID-19 declaration under Section 564(b)(1) of the Act, 21 U.S.C. section 360bbb-3(b)(1), unless the authorization is  terminated or revoked sooner. Performed at [facility], [place] [address], [place], [place] [address]     FURTHER DISCHARGE INSTRUCTIONS:  Get Medicines reviewed and adjusted: Please take all your medications with you for your next visit with your Primary MD  Laboratory/radiological data: Please request your Primary MD to go over all hospital tests and procedure/radiological results at the follow up, please ask your Primary MD to get all Hospital records sent to his/her office.  In some cases, they will be blood work, cultures and biopsy results pending at the time of your discharge. Please request that your primary care M.D. goes through all the records of your hospital data and follows up on these results.  Also Note the following: If you experience worsening of your admission symptoms, develop shortness of breath, life threatening emergency, suicidal or homicidal thoughts you must seek medical attention immediately by calling 911 or calling your MD immediately  if symptoms less severe.  You must read  complete instructions/literature along with all the possible adverse reactions/side effects for all the Medicines you take and that have been prescribed to you. Take any new Medicines after you have completely understood and accpet all the possible adverse reactions/side effects.   Do not drive when taking Pain medications or sleeping medications (Benzodaizepines)  Do not take more than prescribed Pain, Sleep and Anxiety Medications. It is not advisable to combine anxiety,sleep and pain medications without talking with your primary care practitioner  Special Instructions: If you have smoked or chewed Tobacco  in the last 2 yrs please stop smoking, stop any regular Alcohol  and or any Recreational drug use.  Wear Seat belts while driving.  Please note: You were cared for by a hospitalist during your hospital stay. Once you are discharged, your primary care physician will handle any  further medical issues. Please note that NO REFILLS for any discharge medications will be authorized once you are discharged, as it is imperative that you return to your primary care physician (or establish a relationship with a primary care physician if you do not have one) for your post hospital discharge needs so that they can reassess your need for medications and monitor your lab values.  Total Time spent coordinating discharge including counseling, education and face to face time equals 35 minutes.  SignedOren Binet 04/20/2020 5:21 PM

## 2020-04-20 NOTE — Evaluation (Signed)
Speech Language Pathology Evaluation Patient Details Name: [name] MRN: [id number] DOB: [birth year] Today's Date: 04/20/2020 Time: 1040-1100 SLP Time Calculation (min) (ACUTE ONLY): 20 min  Problem List:  Patient Active Problem List   Diagnosis Date Noted   CVA (cerebral vascular accident) ([place]) 04/18/2020   Right sided weakness 04/17/2020   History of CVA with residual deficit 04/17/2020   Chronic combined systolic (congestive) and diastolic (congestive) heart failure ([place]) 04/17/2020   Substance use disorder 04/17/2020   Abnormal EKG 02/07/2020   Left hip pain 02/07/2020   AKI (acute kidney injury) ([place]) 61/95/0932   Systolic heart failure secondary to hypertension ([name])    Schizophrenia ([place])    Acute systolic heart failure ([place]) 02/02/2018   Chest pain 01/31/2018   Hypokalemia    Schizoaffective disorder, bipolar type ([place]) 11/22/2017   Trauma    Tachycardia 03/03/2017   Alcohol abuse    Cocaine abuse ([place])    Hypertensive urgency    Tachycardia    Cocaine abuse with cocaine-induced mood disorder ([place]) 12/05/2016   Cerebrovascular disease 09/02/2015   Neurocognitive disorder, unspecified secondary to cerebrovascular disease 09/02/2015   Malignant hypertension 08/30/2015   Alcohol use disorder, severe, dependence ([place]) 08/30/2015   Alcohol withdrawal ([place]) 08/30/2015   Alcohol abuse with alcohol-induced mood disorder ([place]) 08/30/2015   Thrombocytopenia ([place]) 08/27/2015   Tobacco use disorder 08/26/2015   Hyperlipidemia associated with type 2 diabetes mellitus ([place]) 04/28/2015   Schizoaffective disorder, depressive type ([place])    Hypertension associated with diabetes ([place]) 08/09/2013   Gout 08/09/2013   Diabetes mellitus ([place]) 07/09/2012   Past Medical History:  Past Medical History:  Diagnosis Date   Alcohol abuse    Arthritis    Crack cocaine use    Depression    Diabetes mellitus    Drug abuse ([place])    DTs  (delirium tremens) ([place])    history of    Gout    Noncompliance with medication regimen    Schizophrenia ([place])    Stroke ([place])    Systolic heart failure secondary to hypertension ([place])    Uncontrolled hypertension    Past Surgical History:  Past Surgical History:  Procedure Laterality Date   LEFT HEART CATH AND CORONARY ANGIOGRAPHY N/A 02/03/2018   Procedure: LEFT HEART CATH AND CORONARY ANGIOGRAPHY;  Surgeon: Jettie Booze, MD;  Location: [facility];  Service: Cardiovascular;  Laterality: N/A;   MANDIBLE RECONSTRUCTION     TONSILLECTOMY     HPI:  Mont Jagoda is a 59 y.o. male with medical history significant for history of stroke with residual right-sided weakness, neurocognitive impairment, chronic combined systolic and diastolic CHF, type 2 diabetes, hypertension, hyperlipidemia, schizoaffective disorder, chronic left hip pain, and history of polysubstance use disorder admitted with with worsening right-sided weakness and dysarthria from his baseline. MRI small acute infarct of the left pons. No hemorrhage or mass effect, multifocal chronic microhemorrhage within the cerebellum and both cerebral hemispheres.   Assessment / Plan / Recommendation Clinical Impression  Pt demonstrates a severe dysarthria, with a potential baseline language impairment. ROM of tongue appears adequate, but there is minimal use of tongue in articulation during connected speech. Pt glides over entire words at times with occasional articulation of lips and mandible to make speech partially intelligible. Pt is tangential and requires heavy cueing to participate in strategies to improve articulation. Pt needed a model to repeat multisyllabic words and short pharses, though he was stimulable for overarticulation cues and was able to say his  last name, articulating all the phonemes multiple times in the session. Pt has potential to improve intelligiblity with targeted therapy. Agree with  CIR at d/c.     SLP Assessment  SLP Recommendation/Assessment: Patient needs continued Speech Lanaguage Pathology Services SLP Visit Diagnosis: Dysarthria and anarthria (R47.1)    Follow Up Recommendations  Inpatient Rehab    Frequency and Duration min 2x/week  2 weeks      SLP Evaluation Cognition  Overall Cognitive Status: No family/caregiver present to determine baseline cognitive functioning Arousal/Alertness: Awake/alert Orientation Level: Oriented X4 Attention: Focused;Sustained;Selective Focused Attention: Appears intact Sustained Attention: Appears intact Selective Attention: Appears intact Memory: Appears intact Awareness: Impaired Awareness Impairment: Emergent impairment Problem Solving: Impaired Problem Solving Impairment: Functional basic Executive Function: Self Monitoring;Self Correcting Self Monitoring: Impaired Self Monitoring Impairment: Functional basic Self Correcting: Impaired Self Correcting Impairment: Functional basic Behaviors: Impulsive       Comprehension  Auditory Comprehension Overall Auditory Comprehension: Appears within functional limits for tasks assessed    Expression Verbal Expression Overall Verbal Expression: Appears within functional limits for tasks assessed   Oral / Motor  Oral Motor/Sensory Function Overall Oral Motor/Sensory Function: Mild impairment Facial ROM: Reduced right;Suspected CN VII (facial) dysfunction Facial Symmetry: Abnormal symmetry right;Suspected CN VII (facial) dysfunction Facial Strength: Reduced right;Suspected CN VII (facial) dysfunction Facial Sensation: Reduced right;Suspected CN V (Trigeminal) dysfunction Lingual Symmetry: Within Functional Limits Lingual Strength: Reduced Motor Speech Overall Motor Speech: Impaired Respiration: Within functional limits Phonation: Normal Resonance: Within functional limits Articulation: Impaired Level of Impairment: Word Intelligibility: Intelligibility  reduced Word: 25-49% accurate Phrase: 0-24% accurate Sentence: 0-24% accurate Conversation: 0-24% accurate Motor Planning: Witnin functional limits   GO                   Herbie [place], MA CCC-SLP  Acute Rehabilitation Services Pager [phone] Office [phone]  Lynann Beaver 04/20/2020, 11:49 AM

## 2020-04-21 ENCOUNTER — Inpatient Hospital Stay (HOSPITAL_COMMUNITY): Payer: Medicaid Other | Admitting: Physical Therapy

## 2020-04-21 ENCOUNTER — Other Ambulatory Visit: Payer: Self-pay | Admitting: Physician Assistant

## 2020-04-21 ENCOUNTER — Inpatient Hospital Stay (HOSPITAL_COMMUNITY): Payer: Medicaid Other | Admitting: Occupational Therapy

## 2020-04-21 ENCOUNTER — Inpatient Hospital Stay (HOSPITAL_COMMUNITY): Payer: Medicaid Other | Admitting: Speech Pathology

## 2020-04-21 DIAGNOSIS — I679 Cerebrovascular disease, unspecified: Secondary | ICD-10-CM

## 2020-04-21 DIAGNOSIS — I6302 Cerebral infarction due to thrombosis of basilar artery: Secondary | ICD-10-CM

## 2020-04-21 LAB — GLUCOSE, CAPILLARY
Glucose-Capillary: 167 mg/dL — ABNORMAL HIGH (ref 70–99)
Glucose-Capillary: 138 mg/dL — ABNORMAL HIGH (ref 70–99)
Glucose-Capillary: 140 mg/dL — ABNORMAL HIGH (ref 70–99)
Glucose-Capillary: 157 mg/dL — ABNORMAL HIGH (ref 70–99)

## 2020-04-21 LAB — CBC WITH DIFFERENTIAL/PLATELET
Abs Immature Granulocytes: 0.04 10*3/uL (ref 0.00–0.07)
Basophils Absolute: 0 10*3/uL (ref 0.0–0.1)
Basophils Relative: 0 %
Eosinophils Absolute: 0.3 10*3/uL (ref 0.0–0.5)
Eosinophils Relative: 4 %
HCT: 40.3 % (ref 39.0–52.0)
Hemoglobin: 12.8 g/dL — ABNORMAL LOW (ref 13.0–17.0)
Immature Granulocytes: 0 %
Lymphocytes Relative: 20 %
Lymphs Abs: 1.8 10*3/uL (ref 0.7–4.0)
MCH: 26.7 pg (ref 26.0–34.0)
MCHC: 31.8 g/dL (ref 30.0–36.0)
MCV: 84.1 fL (ref 80.0–100.0)
Monocytes Absolute: 0.9 10*3/uL (ref 0.1–1.0)
Monocytes Relative: 10 %
Neutro Abs: 5.8 10*3/uL (ref 1.7–7.7)
Neutrophils Relative %: 66 %
Platelets: 204 10*3/uL (ref 150–400)
RBC: 4.79 MIL/uL (ref 4.22–5.81)
RDW: 12.3 % (ref 11.5–15.5)
WBC: 8.9 10*3/uL (ref 4.0–10.5)
nRBC: 0 % (ref 0.0–0.2)

## 2020-04-21 LAB — COMPREHENSIVE METABOLIC PANEL
ALT: 12 U/L (ref 0–44)
AST: 20 U/L (ref 15–41)
Albumin: 3.7 g/dL (ref 3.5–5.0)
Alkaline Phosphatase: 66 U/L (ref 38–126)
Anion gap: 12 (ref 5–15)
BUN: 16 mg/dL (ref 6–20)
CO2: 25 mmol/L (ref 22–32)
Calcium: 9.3 mg/dL (ref 8.9–10.3)
Chloride: 100 mmol/L (ref 98–111)
Creatinine, Ser: 1.3 mg/dL — ABNORMAL HIGH (ref 0.61–1.24)
GFR calc Af Amer: >60 mL/min (ref >60)
GFR calc non Af Amer: >60 mL/min (ref >60)
Glucose, Bld: 156 mg/dL — ABNORMAL HIGH (ref 70–99)
Potassium: 3.7 mmol/L (ref 3.5–5.1)
Sodium: 137 mmol/L (ref 135–145)
Total Bilirubin: 0.5 mg/dL (ref 0.3–1.2)
Total Protein: 7.1 g/dL (ref 6.5–8.1)

## 2020-04-21 MED ORDER — CARVEDILOL 25 MG PO TABS
25.0000 mg | ORAL_TABLET | Freq: Two times a day (BID) | ORAL | Status: DC
  Administered 2020-04-21 – 2020-05-16 (×51): 25 mg via ORAL
  Filled 2020-04-21 (×51): qty 1

## 2020-04-21 MED ORDER — BLOOD PRESSURE CONTROL BOOK
Freq: Once | Status: AC
  Filled 2020-04-21: qty 1

## 2020-04-21 MED ORDER — METFORMIN HCL 500 MG PO TABS
500.0000 mg | ORAL_TABLET | Freq: Two times a day (BID) | ORAL | Status: DC
  Administered 2020-04-21 – 2020-05-16 (×51): 500 mg via ORAL
  Filled 2020-04-21 (×51): qty 1

## 2020-04-21 MED ORDER — PANTOPRAZOLE SODIUM 40 MG PO TBEC
40.0000 mg | DELAYED_RELEASE_TABLET | Freq: Every day | ORAL | Status: DC
  Administered 2020-04-21 – 2020-05-16 (×26): 40 mg via ORAL
  Filled 2020-04-21 (×26): qty 1

## 2020-04-21 MED ORDER — LIVING WELL WITH DIABETES BOOK
Freq: Once | Status: DC
  Filled 2020-04-21: qty 1

## 2020-04-21 MED ORDER — FLUOXETINE HCL 10 MG PO CAPS
10.0000 mg | ORAL_CAPSULE | Freq: Every day | ORAL | Status: DC
  Administered 2020-04-21 – 2020-05-15 (×25): 10 mg via ORAL
  Filled 2020-04-21 (×25): qty 1

## 2020-04-21 MED ORDER — MIRTAZAPINE 15 MG PO TABS
7.5000 mg | ORAL_TABLET | Freq: Every day | ORAL | Status: DC
  Administered 2020-04-21 – 2020-05-15 (×25): 7.5 mg via ORAL
  Filled 2020-04-21 (×25): qty 1

## 2020-04-21 MED ORDER — AMLODIPINE BESYLATE 5 MG PO TABS
5.0000 mg | ORAL_TABLET | Freq: Every day | ORAL | Status: DC
  Administered 2020-04-21 – 2020-04-23 (×3): 5 mg via ORAL
  Filled 2020-04-21 (×3): qty 1

## 2020-04-21 MED ORDER — LIVING BETTER WITH HEART FAILURE BOOK: Freq: Once | Status: AC

## 2020-04-21 MED ORDER — HYDROXYZINE HCL 25 MG PO TABS
25.0000 mg | ORAL_TABLET | Freq: Two times a day (BID) | ORAL | Status: DC
  Administered 2020-04-21 – 2020-05-15 (×50): 25 mg via ORAL
  Filled 2020-04-21 (×51): qty 1

## 2020-04-21 NOTE — IPOC Note (Addendum)
Overall Plan of Care [facility]) Patient Details Name: [name] MRN: [id number] DOB: Jun 02, 1961  Admitting Diagnosis: Left pontine cerebrovascular accident [facility])  Hospital Problems: Principal Problem:   Left pontine cerebrovascular accident [facility]) Active Problems:   Diabetes mellitus ([place])   Hypertension associated with diabetes ([place])   Hyperlipidemia associated with type 2 diabetes mellitus ([place])   Schizoaffective disorder, bipolar type ([place])   Dysphagia, post-stroke   Chronic diastolic congestive heart failure ([place])     Functional Problem List: Nursing Endurance, Medication Management, Motor, Nutrition, Pain, Perception, Safety, Sensory  PT Balance, Perception, Behavior, Safety, Edema, Sensory, Endurance, Skin Integrity, Motor, Nutrition, Pain  OT Balance, Cognition, Perception, Pain, Endurance, Motor  SLP Cognition, Linguistic  TR         Basic ADL's: OT Eating, Grooming, Bathing, Dressing, Toileting     Advanced  ADL's: OT       Transfers: PT Bed Mobility, Bed to Chair, Car, Manufacturing systems engineer, Metallurgist: PT Ambulation, Emergency planning/management officer, Stairs     Additional Impairments: OT Fuctional Use of Upper Extremity  SLP Swallowing, Communication, Social Cognition expression Problem Solving, Memory, Awareness  TR      Anticipated Outcomes Item Anticipated Outcome  Self Feeding setup assist  Swallowing  mod I   Basic self-care  min assist  Toileting  min assist   Bathroom Transfers min assist  Bowel/Bladder  MIn I assist with toileting  Transfers  min assist  Locomotion  min assist using LRAD  Communication  min assist  Cognition  supervision  Pain  Pain goal of 3 on 0-10pain scale  Safety/Judgment  Patient to remain safe using Min I assistance with saftey and judgement   Therapy Plan: PT Intensity: Minimum of 1-2 x/day ,45 to 90 minutes PT Frequency: 5 out of 7 days PT Duration Estimated Length of Stay: ~3.5-4  weeks OT Intensity: Minimum of 1-2 x/day, 45 to 90 minutes OT Frequency: 5 out of 7 days OT Duration/Estimated Length of Stay: 23-26 days SLP Intensity: Minumum of 1-2 x/day, 30 to 90 minutes SLP Frequency: 3 to 5 out of 7 days SLP Duration/Estimated Length of Stay: 12-15 days   Due to the current state of emergency, patients may not be receiving their 3-hours of Medicare-mandated therapy.   Team Interventions: Nursing Interventions Patient/Family Education, Disease Management/Prevention, Discharge Planning, Psychosocial Support, Cognitive Remediation/Compensation, Pain Management, Medication Management  PT interventions Ambulation/gait training, Community reintegration, DME/adaptive equipment instruction, Neuromuscular re-education, Psychosocial support, Stair training, UE/LE Strength taining/ROM, Wheelchair propulsion/positioning, Training and development officer, Discharge planning, Functional electrical stimulation, Pain management, Skin care/wound management, Cognitive remediation/compensation, Functional mobility training, Therapeutic Activities, UE/LE Coordination activities, Disease management/prevention, Patient/family education, Splinting/orthotics, Therapeutic Exercise, Visual/perceptual remediation/compensation  OT Interventions Balance/vestibular training, Discharge planning, Functional electrical stimulation, Therapeutic Activities, Self Care/advanced ADL retraining, Pain management, UE/LE Coordination activities, Cognitive remediation/compensation, Disease mangement/prevention, Functional mobility training, Patient/family education, Therapeutic Exercise, Visual/perceptual remediation/compensation, Academic librarian, Engineer, drilling, Neuromuscular re-education, Psychosocial support, Splinting/orthotics, UE/LE Strength taining/ROM, Wheelchair propulsion/positioning  SLP Interventions Cognitive remediation/compensation, English as a second language teacher, Dysphagia/aspiration precaution  training, Internal/external aids, Patient/family education, Speech/Language facilitation  TR Interventions    SW/CM Interventions Discharge Planning, Psychosocial Support, Patient/Family Education   Barriers to Discharge MD  Medical stability  Nursing Medical stability, Medication compliance    PT Decreased caregiver support, Home environment access/layout, Incontinence, Medical stability, Lack of/limited family support, Neurogenic Bowel & Bladder    OT      SLP      SW  Team Discharge Planning: Destination: PT-Home ,OT- Home , SLP-Home Projected Follow-up: PT-Home health PT, 24 hour supervision/assistance, OT-  24 hour supervision/assistance, SLP-Home Health SLP, 24 hour supervision/assistance Projected Equipment Needs: PT-To be determined, OT- To be determined, SLP-None recommended by SLP Equipment Details: PT-pt has RW, OT-  Patient/family involved in discharge planning: PT- Patient,  OT-Patient, SLP-Patient  MD ELOS: 21-25d Medical Rehab Prognosis:  Good Assessment:  59 year old right-handed male with 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  Patient lives with children 1 level home 3 steps to entry.  Ambulated modified independent with a rolling walker prior to admission.  Family assistance as needed.  Presented 04/17/2020 with increasing right side weakness and dysarthria.  MRI identified small acute infarct of the left pons.  No hemorrhage or mass-effect.  Moderate chronic ischemic microangiopathy.  Patient did not receive TPA.  MRA without emergent large vessel occlusion.  CT angiogram head and neck advanced intracranial atherosclerosis with high-grade narrowing at the supraclinoid right ICA, left A1 origin,  bilateral pericallosal arteries and bilateral PCA branches.  Echocardiogram with ejection fraction of 50%.  The left ventricle demonstrated global hypokinesis.  Admission chemistries BUN 23 creatinine 1.40 urine drug screen negative as well as alcohol negative.  Neurology follow-up maintained on aspirin 325 mg and Plavix for CVA prophylaxis x3 months then Plavix alone.  Subcutaneous Lovenox for DVT prophylaxis.  Currently on a dysphagia #2 thin liquid diet.  Therapy evaluations completed and patient was admitted for a comprehensive rehab program   See Team Conference Notes for weekly updates to the plan of care

## 2020-04-21 NOTE — Progress Notes (Signed)
Patient's Coreg has been resumed with noted permissive hypertension and can resume Norvasc hydralazine losartan over the next few days as needed.  Glucophage also resumed 500 mg twice daily home regimen as well as patient's Prozac and Remeron.

## 2020-04-21 NOTE — Evaluation (Signed)
Physical Therapy Assessment and Plan  Patient Details  Name: [name] MRN: [id number] Date of Birth: 25-Aug-1961  PT Diagnosis: Abnormal posture, Abnormality of gait, Cognitive deficits, Difficulty walking, Hemiparesis dominant, Hypotonia, Impaired cognition, Impaired sensation, Muscle weakness and Pain in L hip Rehab Potential: Fair ELOS: ~3.5-4 weeks   Today's Date: 04/21/2020 PT Individual Time: 5208-0223 PT Individual Time Calculation (min): 69 min    Problem List:  Patient Active Problem List   Diagnosis Date Noted  . 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[place]) 04/20/2020  . CVA (cerebral vascular accident) ([place]) 04/18/2020  . Right sided weakness 04/17/2020  . History of CVA with residual deficit 04/17/2020  . Chronic combined systolic (congestive) and diastolic (congestive) heart failure ([place]) 04/17/2020  . Substance use disorder 04/17/2020  . Abnormal EKG 02/07/2020  . Left hip pain 02/07/2020  . AKI (acute kidney injury) ([place]) 02/07/2020  . Systolic heart failure secondary to hypertension ([place])   . Schizophrenia ([place])   . Acute systolic heart failure ([place]) 02/02/2018  . Chest pain 01/31/2018  . Hypokalemia   . Schizoaffective disorder, bipolar type ([place]) 11/22/2017  . Trauma   . Tachycardia 03/03/2017  . Alcohol abuse   . Cocaine abuse ([place])   . Hypertensive urgency   . Tachycardia   . Cocaine abuse with cocaine-induced mood disorder ([place]) 12/05/2016  . Cerebrovascular disease 09/02/2015  . 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 Malignant hypertension 08/30/2015  . Alcohol use disorder, severe, dependence ([place]) 08/30/2015  . Alcohol withdrawal ([place]) 08/30/2015  . Alcohol abuse with alcohol-induced mood disorder ([place]) 08/30/2015  . Thrombocytopenia ([place]) 08/27/2015  . Tobacco use disorder 08/26/2015  . Hyperlipidemia associated with type 2 diabetes mellitus ([place]) 04/28/2015  . Schizoaffective disorder,  depressive type ([place])   . Hypertension associated with diabetes ([place]) 08/09/2013  . Gout 08/09/2013  . Diabetes mellitus ([place]) 07/09/2012    Past Medical History:  Past Medical History:  Diagnosis Date  . Alcohol abuse   . Arthritis   . Crack cocaine use   . Depression   . Diabetes mellitus   . Drug abuse ([place])   . DTs (delirium tremens) ([place])    history of   . Gout   . Noncompliance with medication regimen   . Schizophrenia ([place])   . Stroke ([place])   . Systolic heart failure secondary to hypertension ([place])   . Uncontrolled hypertension    Past Surgical History:  Past Surgical History:  Procedure Laterality Date  . LEFT HEART CATH AND CORONARY ANGIOGRAPHY N/A 02/03/2018   Procedure: LEFT HEART CATH AND CORONARY ANGIOGRAPHY;  Surgeon: Jettie Booze, MD;  Location: [facility];  Service: Cardiovascular;  Laterality: N/A;  . MANDIBLE RECONSTRUCTION    . TONSILLECTOMY      Assessment & Plan Clinical Impression: Patient is a 59 y.o. year old right-handed male with 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  Patient lives with children 1 level home 3 steps to entry.  Ambulated modified independent with a rolling walker prior to admission.  Family assistance as needed.  Presented 04/17/2020 with increasing right side weakness and dysarthria.  MRI identified small acute infarct of the left pons.  No hemorrhage or mass-effect.  Moderate chronic ischemic microangiopathy.  Patient did not receive TPA.  MRA  without emergent large vessel occlusion.  CT angiogram head and neck advanced intracranial atherosclerosis with high-grade narrowing at the supraclinoid right ICA, left A1 origin, bilateral pericallosal arteries and  bilateral PCA branches.  Echocardiogram with ejection fraction of 50%.  The left ventricle demonstrated global hypokinesis.  Admission chemistries BUN 23 creatinine 1.40 urine drug screen negative as well as alcohol negative.  Neurology follow-up maintained on aspirin 325 mg and Plavix for CVA prophylaxis x3 months then Plavix alone.  Subcutaneous Lovenox for DVT prophylaxis.  Currently on a dysphagia #2 thin liquid diet.  Therapy evaluations completed and patient was admitted for a comprehensive rehab program. Patient transferred to CIR on 04/20/2020 .   Patient currently requires +2 max assist with mobility secondary to muscle weakness and muscle paralysis, decreased cardiorespiratoy endurance, impaired timing and sequencing, abnormal tone, unbalanced muscle activation, motor apraxia, decreased coordination and decreased motor planning, decreased midline orientation and decreased attention to right, decreased initiation, decreased attention, decreased awareness, decreased problem solving, decreased safety awareness, decreased memory and delayed processing and decreased sitting balance, decreased standing balance, decreased postural control and decreased balance strategies.  Prior to hospitalization, patient was modified independent  with mobility and lived with Daughter in a [place] home.  Home access is  Level entry.  Patient will benefit from skilled PT intervention to maximize safe functional mobility, minimize fall risk and decrease caregiver burden for planned discharge home with 24 hour assist.  Anticipate patient will benefit from follow up [facility] at discharge.  PT - End of Session Activity Tolerance: Tolerates 30+ min activity with multiple rests Endurance Deficit: Yes Endurance Deficit Description: pt very fatigued requiring frequent rest breaks and assist back to bed at end of session PT Assessment Rehab Potential (ACUTE/IP ONLY): Fair PT Barriers to Discharge: Decreased caregiver support;Home  environment access/layout;Incontinence;Medical stability;Lack of/limited family support;Neurogenic Bowel & Bladder PT Patient demonstrates impairments in the following area(s): Balance;Perception;Behavior;Safety;Edema;Sensory;Endurance;Skin Integrity;Motor;Nutrition;Pain PT Transfers Functional Problem(s): Bed Mobility;Bed to Chair;Car;Furniture PT Locomotion Functional Problem(s): Ambulation;Wheelchair Mobility;Stairs PT Plan PT Intensity: Minimum of 1-2 x/day ,45 to 90 minutes PT Frequency: 5 out of 7 days PT Duration Estimated Length of Stay: ~3.5-4 weeks PT Treatment/Interventions: Ambulation/gait training;Community reintegration;DME/adaptive equipment instruction;Neuromuscular re-education;Psychosocial support;Stair training;UE/LE Strength taining/ROM;Wheelchair propulsion/positioning;Balance/vestibular training;Discharge planning;Functional electrical stimulation;Pain management;Skin care/wound management;Cognitive remediation/compensation;Functional mobility training;Therapeutic Activities;UE/LE Coordination activities;Disease management/prevention;Patient/family education;Splinting/orthotics;Therapeutic Exercise;Visual/perceptual remediation/compensation PT Transfers Anticipated Outcome(s): min assist PT Locomotion Anticipated Outcome(s): min assist using LRAD PT Recommendation Follow Up Recommendations: Home health PT;24 hour supervision/assistance Patient destination: Home Equipment Recommended: To be determined Equipment Details: pt has RW  Skilled Therapeutic Intervention Evaluation completed (see details above and below) with education on PT POC and goals and individual treatment initiated with focus on bed mobility, functional transfers, activity tolerance, R LE weight bearing, midline orientation, R attention, and pt education regarding daily therapy schedule, weekly team meetings, purpose of PT evaluation, and other CIR information. Pt received sitting in w/c drowsy and having  difficulty keeping eyes open. Pt reports he is "wet" and is agreeable to therapy session. Pt dysarthric with difficulty understanding speech. Sit>stand w/c>stedy with heavy +2 max assist for lifting and balance due to R lean as pt pushing with L UE on steady bar. Pt reports significant L hip pain during transfer, especially once sitting back down - pt reports hx of multiple X-Rays and that this pain has been present since 2nd CVA. Dependent stedy transfer to EOB. Sit>supine with max assist for B LE management into the bed. Rolling R/L in bed with max assist  for pivoting hips and R hemibody management with cuing for sequencing while therapist and +2 assist performed total assist LB clothing management and peri-care. Supine>sitting L EOB with max assist for R LE management off EOB and trunk upright. In sitting pt demonstrates R lateral and posterior lean due to pushing with L UE and impaired midline orientation - with L UE support can maintain static sitting with CGA and without requires mod/max assist to maintain upright. Sit>stand EOB>stedy with heavy +2 assist for lifting and balance due to continued R lateral trunk lean. While in steady high perched positioning for LE weight bearing, performed oral care at sink using mirror feedback for improved midline orientation - pt able to complete some bimanual tasks while therapist provided heavy mod assist for trunk control due to R lean. Stedy transfer to w/c. Provided pt education on CIR as described above while showing pt therapy gyms. Pt requesting to return to bed at end due to significant fatigue. Sit>stand w/c>stedy with heavy +2 max assist for lifting. Stedy transfer to EOB and sit>supine with max assist for B LE management. Pt left supine in bed with needs in reach and bed alarm on.  PT Evaluation Precautions/Restrictions Precautions Precautions: Fall Precaution Comments: pusher, right hemiparesis Restrictions Weight Bearing Restrictions: No Pain Pain  Assessment Pain Scale: 0-10 Pain Score: 8  Pain Type: Acute pain Pain Location: Hip Pain Orientation: Left Pain Descriptors / Indicators: Aching;Tightness Pain Onset: With Activity Pain Intervention(s): Rest;Repositioned;RN made aware Home Living/Prior Functioning Home Living Available Help at Discharge: Family;Available 24 hours/day(reports his daughter works from home) Type of Home: Apartment Home Access: Level entry [place]: One level Bathroom Shower/Tub: Chiropodist: Standard Bathroom Accessibility: Yes  Lives With: Daughter Prior Function Level of Independence: Requires assistive device for independence;Independent with gait;Independent with transfers Comments: mod-I with RW - above information provided by pt during evaluation (no family present) but does not align with information in chart Perception  Perception Perception: Impaired Inattention/Neglect: Does not attend to right visual field(L head turn preference noted) Spatial Orientation: impaired midline orientation (Pusher tendencies) Praxis Praxis: Impaired Praxis Impairment Details: Ideomotor;Motor planning Praxis-Other Comments: motor impersistence  Cognition Overall Cognitive Status: No family/caregiver present to determine baseline cognitive functioning Arousal/Alertness: Lethargic Orientation Level: Oriented X4(at beginning of session oriented x4 but later in session once in therapy gym pt asks if he is still at the hospital) Attention: Focused Focused Attention: Appears intact Awareness: Impaired Problem Solving: Impaired Behaviors: Impulsive Safety/Judgment: Impaired Sensation Sensation Light Touch: Appears Intact Hot/Cold: Not tested Proprioception: Impaired by gross assessment(appears to have impaired awareness of R LE position during mobility tasks) Stereognosis: Not tested Additional Comments: Light touch and proprioception intact in BUEs per gross  testing Coordination Gross Motor Movements are Fluid and Coordinated: No Fine Motor Movements are Fluid and Coordinated: No Coordination and Movement Description: R hemiparesis (LE>UE) with impaired R attention, impaired motor planning, and impaired midline orientation Heel Shin Test: unable to perform with R LE due to paresis Motor  Motor Motor: Abnormal postural alignment and control;Hemiplegia;Motor impersistence Motor - Skilled Clinical Observations: Pushing to the right side with L UE and right RUE and LE hemiparesis  Mobility Bed Mobility Bed Mobility: Rolling Right;Rolling Left;Supine to Sit;Sit to Supine Rolling Right: Maximal Assistance - Patient 25-49% Rolling Left: Maximal Assistance - Patient 25-49% Left Sidelying to Sit: Maximal Assistance - Patient 25-49% Supine to Sit: Maximal Assistance - Patient - Patient 25-49% Sit to Supine: Maximal Assistance - Patient 25-49% Transfers Transfers: Sit to  Stand;Stand to Sit;Transfer via [place] to Stand: 2 Helpers Stand to Sit: 2 Helpers Stand Pivot Transfers: Dependent - mechanical lift(stedy) Transfer (Assistive device): Other (Comment)(stedy) Transfer via Lift Equipment: Animal nutritionist: No Gait Gait: No Stairs / Additional Locomotion Stairs: No Architect: No  Trunk/Postural Assessment  Cervical Assessment Cervical Assessment: Exceptions to WFL(forward head with L head turn preference) Thoracic Assessment Thoracic Assessment: Exceptions to WFL(rounding of shoulders with R lateral lean (pushing)) Lumbar Assessment Lumbar Assessment: Exceptions to WFL(posterior pelvic tilt in sitting) Postural Control Postural Control: Deficits on evaluation Trunk Control: R posterolateral lean in sitting (also pushing with L UE) Righting Reactions: delayed and insufficient with LOB to the right Protective Responses: delayed and insufficient Postural Limitations: decreased with  impaired midline orientation  Balance Balance Balance Assessed: Yes Static Sitting Balance Static Sitting - Balance Support: Feet supported Static Sitting - Level of Assistance: 4: Min assist Dynamic Sitting Balance Dynamic Sitting - Balance Support: During functional activity Dynamic Sitting - Level of Assistance: 3: Mod assist;2: Max assist Extremity Assessment      RLE Assessment RLE Assessment: Exceptions to [facility] Passive Range of Motion (PROM) Comments: WFL General Strength Comments: assessed in sitting and supine - significant paresis with pt also having difficulty following commands and motor planning to perform movements RLE Strength Right Hip Flexion: 2-/5 Right Hip Extension: 2-/5 Right Knee Flexion: 2-/5 Right Knee Extension: 2-/5 Right Ankle Dorsiflexion: 0/5 Right Ankle Plantar Flexion: 0/5 LLE Assessment LLE Assessment: Exceptions to [facility] Active Range of Motion (AROM) Comments: WFL but significant L hip pain LLE Strength Left Hip Flexion: 3/5(limited by pain with movement) Left Hip Extension: 4-/5 Left Knee Flexion: 4+/5 Left Knee Extension: 4+/5 Left Ankle Dorsiflexion: 4+/5 Left Ankle Plantar Flexion: 4+/5    Refer to Care Plan for Long Term Goals  Recommendations for other services: None   Discharge Criteria: Patient will be discharged from PT if patient refuses treatment 3 consecutive times without medical reason, if treatment goals not met, if there is a change in medical status, if patient makes no progress towards goals or if patient is discharged from hospital.  The above assessment, treatment plan, treatment alternatives and goals were discussed and mutually agreed upon: by patient  Tawana Scale, PT, DPT 04/21/2020, 12:59 PM

## 2020-04-21 NOTE — Progress Notes (Signed)
Inpatient Rehabilitation Care Coordinator Assessment and Plan  Patient Details  Name: [name] MRN: [id number] Date of Birth: [birth year]  Today's Date: 04/21/2020  Problem List:  Patient Active Problem List   Diagnosis Date Noted   Left pontine cerebrovascular accident [facility]) 04/20/2020   CVA (cerebral vascular accident) ([place]) 04/18/2020   Right sided weakness 04/17/2020   History of CVA with residual deficit 04/17/2020   Chronic combined systolic (congestive) and diastolic (congestive) heart failure ([place]) 04/17/2020   Substance use disorder 04/17/2020   Abnormal EKG 02/07/2020   Left hip pain 02/07/2020   AKI (acute kidney injury) ([place]) 78/67/6720   Systolic heart failure secondary to hypertension ([place])    Schizophrenia ([place])    Acute systolic heart failure ([place]) 02/02/2018   Chest pain 01/31/2018   Hypokalemia    Schizoaffective disorder, bipolar type ([place]) 11/22/2017   Trauma    Tachycardia 03/03/2017   Alcohol abuse    Cocaine abuse ([place])    Hypertensive urgency    Tachycardia    Cocaine abuse with cocaine-induced mood disorder ([place]) 12/05/2016   Cerebrovascular disease 09/02/2015   Neurocognitive disorder, unspecified secondary to cerebrovascular disease 09/02/2015   Malignant hypertension 08/30/2015   Alcohol use disorder, severe, dependence ([place]) 08/30/2015   Alcohol withdrawal ([place]) 08/30/2015   Alcohol abuse with alcohol-induced mood disorder ([place]) 08/30/2015   Thrombocytopenia ([place]) 08/27/2015   Tobacco use disorder 08/26/2015   Hyperlipidemia associated with type 2 diabetes mellitus ([place]) 04/28/2015   Schizoaffective disorder, depressive type ([place])    Hypertension associated with diabetes ([place]) 08/09/2013   Gout 08/09/2013   Diabetes mellitus ([place]) 07/09/2012   Past Medical History:  Past Medical History:  Diagnosis Date   Alcohol abuse    Arthritis    Crack cocaine use    Depression    Diabetes  mellitus    Drug abuse ([place])    DTs (delirium tremens) ([place])    history of    Gout    Noncompliance with medication regimen    Schizophrenia ([place])    Stroke ([place])    Systolic heart failure secondary to hypertension ([place])    Uncontrolled hypertension    Past Surgical History:  Past Surgical History:  Procedure Laterality Date   LEFT HEART CATH AND CORONARY ANGIOGRAPHY N/A 02/03/2018   Procedure: LEFT HEART CATH AND CORONARY ANGIOGRAPHY;  Surgeon: Jettie Booze, MD;  Location: [facility];  Service: Cardiovascular;  Laterality: N/A;   MANDIBLE RECONSTRUCTION     TONSILLECTOMY     Social History:  reports that he has been smoking cigarettes. He has been smoking about 0.25 packs per day. He has never used smokeless tobacco. He reports current alcohol use of about 1.0 - 2.0 standard drinks of alcohol per week. He reports current drug use. Drugs: "Crack" cocaine and Cocaine.  Family / [place] Patient Roles: Spouse Spouse/Significant Other: Marita Children: 7 Children (Primary:[name]) Anticipated Caregiver: Katharine Look Ability/Limitations of Caregiver: Works from home during day Caregiver Availability: 24/7  Social History Preferred language: English Religion: Holiness Cultural Background: Musician Read: Yes Write: Yes Employment Status: Retired   Abuse/Neglect Abuse/Neglect Assessment Can Be Completed: Yes Physical Abuse: Denies Verbal Abuse: Denies Sexual Abuse: Denies Exploitation of patient/patient's resources: Denies Self-Neglect: Denies  Emotional Status [name] affect, behavior and adjustment status: Daughter states patient is more cranky, fustrated and paroniod Recent Psychosocial Issues: no Psychiatric History: yes Substance Abuse History: yes  Patient / Family Perceptions, Expectations & Goals Pt/Family understanding of illness & functional limitations: yes Pt/family  expectations/goals: Goal to discharge to daughter's home  [employer]: None Premorbid Home Care/DME Agencies: None Transportation available at discharge: Daughter able to transport Resource referrals recommended: Neuropsychology  Discharge Planning Living Arrangements: Children Support Systems: Children Type of Residence: Private residence(1 step to enter) Insurance Resources: Medicaid (specify county) Living Expenses: Lives with family Does the patient have any problems obtaining your medications?: No Care Coordinator Anticipated Follow Up Needs: HH/OP Expected length of stay: 12-15 Days  Clinical Impression Sw met patient introduced self. Explained role and processes SW will continue to follow up for questions and concerns.    Dyanne Iha 04/21/2020, 1:53 PM

## 2020-04-21 NOTE — Progress Notes (Signed)
Inpatient [facility] Individual Statement of Services  Patient Name:  [name]  Date:  04/21/2020  Welcome to the [facility].  Our goal is to provide you with an individualized program based on your diagnosis and situation, designed to meet your specific needs.  With this comprehensive rehabilitation program, you will be expected to participate in at least 3 hours of rehabilitation therapies Monday-Friday, with modified therapy programming on the weekends.  Your rehabilitation program will include the following services:  Physical Therapy (PT), Occupational Therapy (OT), Speech Therapy (ST), 24 hour per day rehabilitation nursing, Therapeutic Recreaction (TR), Neuropsychology, Care Coordinator, Rehabilitation Medicine, Nutrition Services and Pharmacy Services  Weekly team conferences will be held on Wednesdays to discuss your progress.  Your Inpatient Rehabilitation Care Coordinator will talk with you frequently to get your input and to update you on team discussions.  Team conferences with you and your family in attendance may also be held.  Expected length of stay: 12- 15 Days  Overall anticipated outcome: MOD I to Supervision  Depending on your progress and recovery, your program may change. Your Inpatient Rehabilitation Care Coordinator will coordinate services and will keep you informed of any changes. Your Inpatient Rehabilitation Care Coordinator's name and contact numbers are listed  below.  The following services may also be recommended but are not provided by the [facility]:    [place] will be made to provide these services after discharge if needed.  Arrangements include referral to agencies that provide these services.  Your insurance has been verified to be:  Medicaid Your primary doctor is:  Gwenyth Bouillon, [place]  Pertinent information will be  shared with your doctor and your insurance company.  Inpatient Rehabilitation Care Coordinator:  Erlene Quan, [place] or [phone]  Information discussed with and copy given to patient by: Dyanne Iha, 04/21/2020, 10:34 AM

## 2020-04-21 NOTE — Progress Notes (Signed)
Inpatient Rehabilitation  Patient information reviewed and entered into eRehab system by Yulisa Chirico M. Biran Mayberry, M.A., CCC/SLP, PPS Coordinator.  Information including medical coding, functional ability and quality indicators will be reviewed and updated through discharge.    

## 2020-04-21 NOTE — Evaluation (Signed)
Speech Language Pathology Assessment and Plan  Patient Details  Name: [name] MRN: [id number] Date of Birth: May 12, 1961  SLP Diagnosis: Dysarthria;Dysphagia;Cognitive Impairments  Rehab Potential: Good ELOS: 12-15 days    Today's Date: 04/21/2020 SLP Individual Time: 0805-0900 SLP Individual Time Calculation (min): 55 min   Problem List:  Patient Active Problem List   Diagnosis Date Noted  . 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[place]) 04/20/2020  . CVA (cerebral vascular accident) ([place]) 04/18/2020  . Right sided weakness 04/17/2020  . History of CVA with residual deficit 04/17/2020  . Chronic combined systolic (congestive) and diastolic (congestive) heart failure ([place]) 04/17/2020  . Substance use disorder 04/17/2020  . Abnormal EKG 02/07/2020  . Left hip pain 02/07/2020  . AKI (acute kidney injury) ([place]) 02/07/2020  . Systolic heart failure secondary to hypertension ([place])   . Schizophrenia ([place])   . Acute systolic heart failure ([place]) 02/02/2018  . Chest pain 01/31/2018  . Hypokalemia   . Schizoaffective disorder, bipolar type ([place]) 11/22/2017  . Trauma   . Tachycardia 03/03/2017  . Alcohol abuse   . Cocaine abuse ([place])   . Hypertensive urgency   . Tachycardia   . Cocaine abuse with cocaine-induced mood disorder ([place]) 12/05/2016  . Cerebrovascular disease 09/02/2015  . 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 Malignant hypertension 08/30/2015  . Alcohol use disorder, severe, dependence ([place]) 08/30/2015  . Alcohol withdrawal ([place]) 08/30/2015  . Alcohol abuse with alcohol-induced mood disorder ([place]) 08/30/2015  . Thrombocytopenia ([place]) 08/27/2015  . Tobacco use disorder 08/26/2015  . Hyperlipidemia associated with type 2 diabetes mellitus ([place]) 04/28/2015  . Schizoaffective disorder, depressive type ([place])   . Hypertension associated with diabetes ([place]) 08/09/2013  . Gout 08/09/2013  . Diabetes mellitus ([place])  07/09/2012   Past Medical History:  Past Medical History:  Diagnosis Date  . Alcohol abuse   . Arthritis   . Crack cocaine use   . Depression   . Diabetes mellitus   . Drug abuse ([place])   . DTs (delirium tremens) ([place])    history of   . Gout   . Noncompliance with medication regimen   . Schizophrenia ([place])   . Stroke ([place])   . Systolic heart failure secondary to hypertension ([place])   . Uncontrolled hypertension    Past Surgical History:  Past Surgical History:  Procedure Laterality Date  . LEFT HEART CATH AND CORONARY ANGIOGRAPHY N/A 02/03/2018   Procedure: LEFT HEART CATH AND CORONARY ANGIOGRAPHY;  Surgeon: Jettie Booze, MD;  Location: [facility];  Service: Cardiovascular;  Laterality: N/A;  . MANDIBLE RECONSTRUCTION    . TONSILLECTOMY      Assessment / Plan / Recommendation Clinical Impression   [name] is a 59 year old right-handed male with history of pontine CVA 2013 residual right-sided weakness.  Presented 04/17/2020 with increasing right side weakness and dysarthria.  MRI identified small acute infarct of the left pons.  No hemorrhage or mass-effect.  Moderate chronic ischemic microangiopathy.   Currently on a dysphagia #2 thin liquid diet.  Therapy evaluations completed and patient was admitted for a comprehensive rehab program.  SLP evaluation was completed on 04/21/20 with results as follows:  Bedside swallow evaluation: Pt presents with immediate coughing on initial sips of thin liquids both with and without straw.  Coughing subsided with trials of nectar thick liquids and when thin liquids via cup sips were resumed pt had no further s/s of aspiration.  Given that pt's cough was found to be  effective for clearing aspiration and penetration from the airway on MBS, I would suggest that pt remain on thin liquids; however, should pt begin showing signs of respiratory compromise or if cough is limiting to PO intake or causing pt discomfort, nectar thick  liquids may be considered.  Pt has prolonged and effortful mastication of solids due to oral motor weakness and missing dentition.  As a result, I would recommend pureed textures for now with ongoing trials of advanced solids with SLP.    Cognitive-linguistic evaluation: Pt presents with a severe dysarthria due to imprecise articulation of consonants resulting from oral motor weakness.  Pt also has low vocal intensity and rapid rate of speech which leads to decreased intelligibility at the word level.  Pt has decreased awareness of his intelligibility deficits and does not make many attempts to modify his speech to improve its perception.  As a result, pt currently requires max assist to use intelligibility strategies at the word level.  Pt also appears to have decreased recall of new information, decreased functional problem solving, and decreased emergent awareness.    As a result, pt would benefit from skilled ST while inpatient in order to maximize functional independence and reduce burden of care prior to discharge.  Anticipate that pt will need 24/7 supervision at discharge in addition to ST follow up at next level of care.    Skilled Therapeutic Interventions          Cognitive-linguistic and bedside swallow evaluation completed with results and recommendations reviewed with patient.     SLP Assessment  Patient will need skilled Speech Lanaguage Pathology Services during CIR admission    Recommendations  SLP Diet Recommendations: Dysphagia 1 (Puree);Thin Liquid Administration via: Cup;No straw Medication Administration: Crushed with puree Supervision: Patient able to self feed;Full supervision/cueing for compensatory strategies Compensations: Slow rate;Small sips/bites Postural Changes and/or Swallow Maneuvers: Seated upright 90 degrees Oral Care Recommendations: Oral care BID Patient destination: Home Follow up Recommendations: Home Health SLP;24 hour supervision/assistance Equipment  Recommended: None recommended by SLP    SLP Frequency 3 to 5 out of 7 days   SLP Duration  SLP Intensity  SLP Treatment/Interventions 12-15 days  Minumum of 1-2 x/day, 30 to 90 minutes  Cognitive remediation/compensation;Cueing hierarchy;Dysphagia/aspiration precaution training;Internal/external aids;Patient/family education;Speech/Language facilitation    Pain Pain Assessment Pain Scale: 0-10 Pain Score: 8  Pain Type: Chronic pain Pain Location: Leg Pain Orientation: Left Pain Descriptors / Indicators: Aching Pain Intervention(s): RN made aware  Prior Functioning Cognitive/Linguistic Baseline: Baseline deficits Type of Home: House  Lives With: Family Available Help at Discharge: Family;Available 24 hours/day Vocation: Unemployed  SLP Evaluation Cognition Overall Cognitive Status: No family/caregiver present to determine baseline cognitive functioning Arousal/Alertness: Awake/alert Orientation Level: Oriented X4 Attention: Sustained Sustained Attention: Appears intact Memory: Impaired Memory Impairment: Decreased recall of new information Awareness: Impaired Awareness Impairment: Emergent impairment Problem Solving: Impaired Problem Solving Impairment: Functional basic Executive Function: Self Monitoring;Self Correcting Self Monitoring: Impaired Self Monitoring Impairment: Verbal basic;Functional basic Self Correcting: Impaired Self Correcting Impairment: Verbal basic;Functional basic Behaviors: Impulsive Safety/Judgment: Impaired  Comprehension Auditory Comprehension Overall Auditory Comprehension: Appears within functional limits for tasks assessed Expression Expression Primary Mode of Expression: Verbal Verbal Expression Overall Verbal Expression: Appears within functional limits for tasks assessed Oral Motor Oral Motor/Sensory Function Overall Oral Motor/Sensory Function: Mild impairment Facial ROM: Reduced right;Suspected CN VII (facial)  dysfunction Facial Symmetry: Abnormal symmetry right;Suspected CN VII (facial) dysfunction Facial Strength: Reduced right;Suspected CN VII (facial) dysfunction Facial Sensation: Reduced right;Suspected CN   V (Trigeminal) dysfunction Lingual Symmetry: Within Functional Limits Lingual Strength: Reduced Motor Speech Overall Motor Speech: Impaired Respiration: Within functional limits Phonation: Normal Resonance: Within functional limits Articulation: Impaired Level of Impairment: Word Intelligibility: Intelligibility reduced Word: 50-74% accurate Phrase: 50-74% accurate Sentence: 25-49% accurate Conversation: 25-49% accurate Motor Planning: Witnin functional limits   Bedside Swallowing Assessment General Previous Swallow Assessment: MBS Diet Prior to this Study: Dysphagia 2 (chopped);Thin liquids Temperature Spikes Noted: No Respiratory Status: Room air History of Recent Intubation: No Behavior/Cognition: Alert;Cooperative;Requires cueing Oral Cavity - Dentition: Missing dentition Self-Feeding Abilities: Able to feed self;Needs set up Vision: Functional for self-feeding Patient Positioning: Upright in bed Baseline Vocal Quality: Normal Volitional Cough: Strong  Oral Care Assessment   Ice Chips   Thin Liquid Thin Liquid: Impaired Presentation: Cup;Straw Pharyngeal  Phase Impairments: Cough - Immediate Nectar Thick Nectar Thick Liquid: Within functional limits Honey Thick   Puree Puree: Within functional limits Solid Solid: Impaired Oral Phase Impairments: Reduced lingual movement/coordination;Poor awareness of bolus;Impaired mastication BSE Assessment Risk for Aspiration Impact on safety and function: Moderate aspiration risk Other Related Risk Factors: Previous CVA  Short Term Goals: Week 1: SLP Short Term Goal 1 (Week 1): Pt will use small bites and sips to mitigate difficulty with oral manipulation and clearance as well as overt s/s of aspiration with dys 1  textures and thin liquids. SLP Short Term Goal 2 (Week 1): Pt will consume therapeutic trials of dys 2 textures with min cues to clear solids from the oral cavity in a timely manner over 3 consecutive sessions prior to advancement. SLP Short Term Goal 3 (Week 1): Pt will utilize slow rate, overarticulation, and increased vocal intensity to achieve intelligibility at the word level with mod assist verbal cues. SLP Short Term Goal 4 (Week 1): Pt will recall daily information with min cues for use of external aids. SLP Short Term Goal 5 (Week 1): Pt will complete basic, familiar tasks with min cues for functional problem solving.  Refer to Care Plan for Long Term Goals  Recommendations for other services: None   Discharge Criteria: Patient will be discharged from SLP if patient refuses treatment 3 consecutive times without medical reason, if treatment goals not met, if there is a change in medical status, if patient makes no progress towards goals or if patient is discharged from hospital.  The above assessment, treatment plan, treatment alternatives and goals were discussed and mutually agreed upon: by patient  ,  L 04/21/2020, 11:01 AM  

## 2020-04-21 NOTE — Progress Notes (Signed)
[facility] PHYSICAL MEDICINE & REHABILITATION PROGRESS NOTE   Subjective/Complaints:  Dysarthric, No pain c/os  ROS no CP, SOB, -N/V/D  Objective:   No results found. Recent Labs    04/21/20 0647  WBC 8.9  HGB 12.8*  HCT 40.3  PLT 204   Recent Labs    04/21/20 0647  NA 137  K 3.7  CL 100  CO2 25  GLUCOSE 156*  BUN 16  CREATININE 1.30*  CALCIUM 9.3    Intake/Output Summary (Last 24 hours) at 04/21/2020 0925 Last data filed at 04/21/2020 4481 Gross per 24 hour  Intake 390 ml  Output 900 ml  Net -510 ml     Physical Exam: Vital Signs Blood pressure (!) 168/106, pulse 80, temperature 98.2 F (36.8 C), temperature source Oral, resp. rate 18, height 5\' 11"  (1.803 m), weight 98.9 kg, SpO2 100 %.   General: No acute distress Mood and affect are appropriate Heart: Regular rate and rhythm no rubs murmurs or extra sounds Lungs: Clear to auscultation, breathing unlabored, no rales or wheezes Abdomen: Positive bowel sounds, soft nontender to palpation, nondistended Extremities: No clubbing, cyanosis, or edema Skin: No evidence of breakdown, no evidence of rash Neurologic: Cranial nerves II through XII intact, motor strength is 5/5 in left and 3- right deltoid, bicep, tricep, grip, hip flexor, knee extensors, ankle dorsiflexor and plantar flexor Sensory exam normal sensation to light touch and proprioception in bilateral upper and lower extremities Cerebellar exam normal finger to nose to finger as well as heel to shin inleft upper and lower extremities unable to do on RIght due to weakness Musculoskeletal: Full range of motion in all 4 extremities. No joint swelling   Assessment/Plan: 1. Functional deficits secondary to Left pontine infarct which require 3+ hours per day of interdisciplinary therapy in a comprehensive inpatient rehab setting.  Physiatrist is providing close team supervision and 24 hour management of active medical problems listed below.  Physiatrist  and rehab team continue to assess barriers to discharge/monitor patient progress toward functional and medical goals  Care Tool:  Bathing              Bathing assist       Upper Body Dressing/Undressing Upper body dressing        Upper body assist      Lower Body Dressing/Undressing Lower body dressing            Lower body assist       Toileting Toileting    Toileting assist       Transfers Chair/bed transfer  Transfers assist           Locomotion Ambulation   Ambulation assist              Walk 10 feet activity   Assist           Walk 50 feet activity   Assist           Walk 150 feet activity   Assist           Walk 10 feet on uneven surface  activity   Assist           Wheelchair     Assist               Wheelchair 50 feet with 2 turns activity    Assist            Wheelchair 150 feet activity     Assist  Blood pressure (!) 168/106, pulse 80, temperature 98.2 F (36.8 C), temperature source Oral, resp. rate 18, height 5\' 11"  (1.803 m), weight 98.9 kg, SpO2 100 %.  Medical Problem List and Plan: 1.  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  Recommendations a 30-day cardiac event monitor as outpatient             -patient may shower             -ELOS/Goals: 2-3 weeks- mod I to supervision 2.  Antithrombotics: -DVT/anticoagulation: Lovenox             -antiplatelet therapy: Aspirin 325 mg and Plavix 75 mg daily x3 months then Plavix alone 3. Pain Management: Voltaren gel 4 times daily, Neurontin 300 mg 3 times daily 4. Mood: Cymbalta 30 mg twice daily             -antipsychotic agents: Invega 3 mg daily 5. Neuropsych: This patient is capable of making decisions on his own behalf. 6. Skin/Wound Care: Routine skin  checks 7. Fluids/Electrolytes/Nutrition: Routine in and outs with follow-up chemistries 8.  Diastolic congestive heart failure.  Monitor for any signs of fluid overload 9.  Diabetes mellitus with peripheral neuropathy.  Hemoglobin A1c 7.7.  SSI.  Patient on Glucophage 500 mg twice daily prior to admission.  Resume as needed CBG (last 3)  Recent Labs    04/20/20 1716 04/20/20 2135 04/21/20 0625  GLUCAP 184* 191* 167*  fair control, monitor prior to changes 10.  Permissive hypertension.  Patient on Coreg 25 mg twice daily, Norvasc 10 mg daily, hydralazine 25 mg 3 times daily, Cozaar 50 mg daily prior to admission.  Resume as needed Vitals:   04/20/20 2132 04/21/20 0400  BP: (!) 180/114 (!) 168/106  Pulse:  80  Resp:  18  Temp:  98.2 F (36.8 C)  SpO2:  100%  resume low dose amlodipine goal is systolic <798, Diastolic <921 for now  11.  Hyperlipidemia.  Lipitor 12.  History of polysubstance tobacco and alcohol abuse.  Urine drug screen negative.  Provide counseling 13. Wheezing/SOB- asked primary team to give pt Albuterol breathing treatment- was ordered and given prior to admission to CIR- suggest to be added to medication list prn.    LOS: 1 days A FACE TO FACE EVALUATION WAS PERFORMED  Charlett Blake 04/21/2020, 9:25 AM

## 2020-04-21 NOTE — Evaluation (Signed)
Occupational Therapy Assessment and Plan  Patient Details  Name: [name] MRN: [id number] Date of Birth: 1961-10-25  OT Diagnosis: abnormal posture, cognitive deficits, hemiplegia affecting non-dominant side and muscle weakness (generalized) Rehab Potential: Rehab Potential (ACUTE ONLY): Good ELOS: 23-26 days   Today's Date: 04/21/2020 OT Individual Time: 7510-2585 OT Individual Time Calculation (min): 56 min     Problem List:  Patient Active Problem List   Diagnosis Date Noted  . 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[place]) 04/20/2020  . CVA (cerebral vascular accident) ([place]) 04/18/2020  . Right sided weakness 04/17/2020  . History of CVA with residual deficit 04/17/2020  . Chronic combined systolic (congestive) and diastolic (congestive) heart failure ([place]) 04/17/2020  . Substance use disorder 04/17/2020  . Abnormal EKG 02/07/2020  . Left hip pain 02/07/2020  . AKI (acute kidney injury) ([place]) 02/07/2020  . Systolic heart failure secondary to hypertension ([place])   . Schizophrenia ([place])   . Acute systolic heart failure ([place]) 02/02/2018  . Chest pain 01/31/2018  . Hypokalemia   . Schizoaffective disorder, bipolar type ([place]) 11/22/2017  . Trauma   . Tachycardia 03/03/2017  . Alcohol abuse   . Cocaine abuse ([place])   . Hypertensive urgency   . Tachycardia   . Cocaine abuse with cocaine-induced mood disorder ([place]) 12/05/2016  . Cerebrovascular disease 09/02/2015  . 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 Malignant hypertension 08/30/2015  . Alcohol use disorder, severe, dependence ([place]) 08/30/2015  . Alcohol withdrawal ([place]) 08/30/2015  . Alcohol abuse with alcohol-induced mood disorder ([place]) 08/30/2015  . Thrombocytopenia ([place]) 08/27/2015  . Tobacco use disorder 08/26/2015  . Hyperlipidemia associated with type 2 diabetes mellitus ([place]) 04/28/2015  . Schizoaffective disorder, depressive type ([place])   . Hypertension  associated with diabetes ([place]) 08/09/2013  . Gout 08/09/2013  . Diabetes mellitus ([place]) 07/09/2012    Past Medical History:  Past Medical History:  Diagnosis Date  . Alcohol abuse   . Arthritis   . Crack cocaine use   . Depression   . Diabetes mellitus   . Drug abuse ([place])   . DTs (delirium tremens) ([place])    history of   . Gout   . Noncompliance with medication regimen   . Schizophrenia ([place])   . Stroke ([place])   . Systolic heart failure secondary to hypertension ([place])   . Uncontrolled hypertension    Past Surgical History:  Past Surgical History:  Procedure Laterality Date  . LEFT HEART CATH AND CORONARY ANGIOGRAPHY N/A 02/03/2018   Procedure: LEFT HEART CATH AND CORONARY ANGIOGRAPHY;  Surgeon: Jettie Booze, MD;  Location: [facility];  Service: Cardiovascular;  Laterality: N/A;  . MANDIBLE RECONSTRUCTION    . TONSILLECTOMY      Assessment & Plan Clinical Impression: Patient is a 59 y.o. year old male with recent admission to the hospital on 04/17/2020 with increasing right side weakness and dysarthria.  MRI identified small acute infarct of the left pons.  Patient transferred to CIR on 04/20/2020 .    Patient currently requires total with basic self-care skills secondary to muscle weakness, impaired timing and sequencing, unbalanced muscle activation, decreased coordination and decreased motor planning, decreased midline orientation and decreased motor planning, decreased awareness and decreased memory and decreased sitting balance, decreased standing balance, decreased postural control, hemiplegia and decreased balance strategies.  Prior to hospitalization, patient could complete ADLs with supervision.  Patient will benefit from skilled intervention to decrease level of assist with basic self-care skills and increase  independence with basic self-care skills prior to discharge home with care partner.  Anticipate patient will require 24 hour supervision and minimal  physical assistance and follow up home health.  OT - End of Session Activity Tolerance: Decreased this session Endurance Deficit: Yes OT Assessment Rehab Potential (ACUTE ONLY): Good OT Patient demonstrates impairments in the following area(s): Balance;Cognition;Perception;Pain;Endurance;Motor OT Basic ADL's Functional Problem(s): Eating;Grooming;Bathing;Dressing;Toileting OT Transfers Functional Problem(s): Toilet;Tub/Shower OT Additional Impairment(s): Fuctional Use of Upper Extremity OT Plan OT Intensity: Minimum of 1-2 x/day, 45 to 90 minutes OT Frequency: 5 out of 7 days OT Duration/Estimated Length of Stay: 23-26 days OT Treatment/Interventions: Balance/vestibular training;Discharge planning;Functional electrical stimulation;Therapeutic Activities;Self Care/advanced ADL retraining;Pain management;UE/LE Coordination activities;Cognitive remediation/compensation;Disease mangement/prevention;Functional mobility training;Patient/family education;Therapeutic Exercise;Visual/perceptual remediation/compensation;Community reintegration;DME/adaptive equipment instruction;Neuromuscular re-education;Psychosocial support;Splinting/orthotics;UE/LE Strength taining/ROM;Wheelchair propulsion/positioning OT Self Feeding Anticipated Outcome(s): setup assist OT Basic Self-Care Anticipated Outcome(s): min assist OT Toileting Anticipated Outcome(s): min assist OT Bathroom Transfers Anticipated Outcome(s): min assist OT Recommendation Recommendations for Other Services: Therapeutic Recreation consult Therapeutic Recreation Interventions: Stress management Patient destination: Home Follow Up Recommendations: 24 hour supervision/assistance Equipment Recommended: To be determined   Skilled Therapeutic Intervention Pt completed selfcare retraining sit to stand from the EOB.  Max assist for transfer to sitting with increased pushing and right side lean noted in sitting.  He needed max assist hand over hand  for integration of the RUE into washing the left arm with total assist +2 for standing with any LB selfcare.  He reported increased pain in the left hip in standing as well, which was relieved once he sat down.  Max assist for UB dressing with total assist +2 (pt 20%) for stand pivot transfer to the wheelchair.  Severe dysarthria noted as well with pt having to repeat himself multiple times when attempting to answer questions with regard to home setup.  Pt left sitting up in the wheelchair in preparation for lunch with safety belt in place and call button and phone in reach on the left side.  Half lap tray was also positioned on the right side as well to support the RUE.   OT Evaluation Precautions/Restrictions  Precautions Precautions: Fall Precaution Comments: pusher, right hemiparesis Restrictions Weight Bearing Restrictions: No  Pain  Pain in the left hip with standing See Pain section for details  Home Living/Prior [place] expects to be discharged to:: Private residence Living Arrangements: Children Available Help at Discharge: Family, Available 24 hours/day Type of Home: Apartment Home Access: Stairs to enter Home Layout: One level Bathroom Shower/Tub: Optometrist: Yes  Lives With: Family IADL History Homemaking Responsibilities: No Current License: No Occupation: On disability Prior Function Level of Independence: Requires assistive device for independence Vocation: Unemployed ADL ADL Eating: Minimal assistance Where Assessed-Eating: Wheelchair Grooming: Minimal assistance Where Assessed-Grooming: Wheelchair Upper Body Bathing: Moderate assistance Where Assessed-Upper Body Bathing: Edge of bed Lower Body Bathing: Other (comment)(total +2) Where Assessed-Lower Body Bathing: Other (Comment), Edge of bed(total +2) Upper Body Dressing: Maximal assistance Where Assessed-Upper Body  Dressing: Edge of bed Lower Body Dressing: Other (Comment)(total +2) Toileting: Other (Comment)(total +2) Where Assessed-Toileting: Bedside Commode Toilet Transfer: Other (comment)(total +2) Toilet Transfer Method: Stand pivot Toilet Transfer Equipment: Bedside commode Vision Baseline Vision/History: Wears glasses Wears Glasses: At all times Patient Visual Report: No change from baseline Vision Assessment?: Vision impaired- to be further tested in functional context Perception  Perception: Impaired(pusher to the right in sitting and standing) Praxis Praxis: Impaired Praxis Impairment Details: Ideomotor Praxis-Other Comments: motor  impersistence Cognition Overall Cognitive Status: No family/caregiver present to determine baseline cognitive functioning Arousal/Alertness: Awake/alert Year: 2021 Month: June Day of Week: Correct Memory: Impaired Memory Impairment: Decreased recall of new information Immediate Memory Recall: Sock;Blue;Bed Memory Recall Sock: Without Cue Memory Recall Blue: Without Cue Memory Recall Bed: Not able to recall Attention: Sustained Focused Attention: Appears intact Sustained Attention: Appears intact Selective Attention: Appears intact Awareness: Impaired Awareness Impairment: Emergent impairment Problem Solving: Impaired Problem Solving Impairment: Functional basic Executive Function: Self Monitoring;Self Correcting Self Monitoring: Impaired Self Monitoring Impairment: Verbal basic;Functional basic Self Correcting: Impaired Self Correcting Impairment: Verbal basic;Functional basic Behaviors: Impulsive Safety/Judgment: Impaired Sensation Sensation Light Touch: Appears Intact Hot/Cold: Not tested Proprioception: Not tested Stereognosis: Appears Intact Additional Comments: Light touch and proprioception intact in BUEs per gross testing Coordination Gross Motor Movements are Fluid and Coordinated: No Fine Motor Movements are Fluid and  Coordinated: No Coordination and Movement Description: Pt with Brunnstrum stage V movement in the left hand and stage III in the arm.  He needs max hand over hand to integrate into functional tasks. Motor  Motor Motor: Abnormal postural alignment and control;Hemiplegia;Motor impersistence Motor - Skilled Clinical Observations: Pt with increased pushing to the right side, with right RUE and LE hemiparesis Mobility  Bed Mobility Bed Mobility: Rolling Left;Left Sidelying to Sit Rolling Left: Maximal Assistance - Patient 25-49% Left Sidelying to Sit: Maximal Assistance - Patient 25-49% Transfers Sit to Stand: Total Assistance - Patient < 25%  Trunk/Postural Assessment  Cervical Assessment Cervical Assessment: Exceptions to WFL(forward head) Thoracic Assessment Thoracic Assessment: Exceptions to WFL(slight shoulder rounding) Lumbar Assessment Lumbar Assessment: Exceptions to WFL(posterior pelvic tilt) Postural Control Postural Control: Deficits on evaluation Trunk Control: increased right lean in sitting Righting Reactions: delayed with LOB to the right  Balance Balance Balance Assessed: Yes Static Sitting Balance Static Sitting - Balance Support: Feet supported Static Sitting - Level of Assistance: 4: Min assist Dynamic Sitting Balance Dynamic Sitting - Balance Support: During functional activity Dynamic Sitting - Level of Assistance: 3: Mod assist Static Standing Balance Static Standing - Balance Support: During functional activity Static Standing - Level of Assistance: 1: +2 Total assist Dynamic Standing Balance Dynamic Standing - Balance Support: During functional activity Dynamic Standing - Level of Assistance: 1: +2 Total assist Extremity/Trunk Assessment RUE Assessment RUE Assessment: Exceptions to [facility] Passive Range of Motion (PROM) Comments: PROM WFLs for all joints General Strength Comments: Pt with gross digit flexion at 90% and gross extension at 80% demonstrating  Brunnstrum stage V movement.  He exhibited Brunnstrum stage III movement in the right arm and shoulder with synergy pattern developing. LUE Assessment LUE Assessment: Within Functional Limits General Strength Comments: WFLs for gross assessment during selfcare tasks     Refer to Care Plan for Long Term Goals  Recommendations for other services: Therapeutic Recreation  Stress management   Discharge Criteria: Patient will be discharged from OT if patient refuses treatment 3 consecutive times without medical reason, if treatment goals not met, if there is a change in medical status, if patient makes no progress towards goals or if patient is discharged from hospital.  The above assessment, treatment plan, treatment alternatives and goals were discussed and mutually agreed upon: by patient  Jaci Desanto OTR/L 04/21/2020, 5:31 PM

## 2020-04-22 ENCOUNTER — Inpatient Hospital Stay (HOSPITAL_COMMUNITY): Payer: Medicaid Other | Admitting: Speech Pathology

## 2020-04-22 ENCOUNTER — Inpatient Hospital Stay (HOSPITAL_COMMUNITY): Payer: Medicaid Other | Admitting: Physical Therapy

## 2020-04-22 ENCOUNTER — Inpatient Hospital Stay (HOSPITAL_COMMUNITY): Payer: Medicaid Other

## 2020-04-22 DIAGNOSIS — E0822 Diabetes mellitus due to underlying condition with diabetic chronic kidney disease: Secondary | ICD-10-CM

## 2020-04-22 DIAGNOSIS — I69391 Dysphagia following cerebral infarction: Secondary | ICD-10-CM

## 2020-04-22 DIAGNOSIS — N185 Chronic kidney disease, stage 5: Secondary | ICD-10-CM

## 2020-04-22 DIAGNOSIS — E1159 Type 2 diabetes mellitus with other circulatory complications: Secondary | ICD-10-CM

## 2020-04-22 DIAGNOSIS — I10 Essential (primary) hypertension: Secondary | ICD-10-CM

## 2020-04-22 DIAGNOSIS — Z794 Long term (current) use of insulin: Secondary | ICD-10-CM

## 2020-04-22 LAB — GLUCOSE, CAPILLARY
Glucose-Capillary: 146 mg/dL — ABNORMAL HIGH (ref 70–99)
Glucose-Capillary: 117 mg/dL — ABNORMAL HIGH (ref 70–99)
Glucose-Capillary: 155 mg/dL — ABNORMAL HIGH (ref 70–99)
Glucose-Capillary: 151 mg/dL — ABNORMAL HIGH (ref 70–99)

## 2020-04-22 NOTE — Progress Notes (Signed)
Physical Therapy Session Note  Patient Details  Name: [name] MRN: [id number] Date of Birth: 02-10-1961  Today's Date: 04/22/2020 PT Individual Time: 1300-1345 PT Individual Time Calculation (min): 45 min   Short Term Goals: Week 1:  PT Short Term Goal 1 (Week 1): Pt will perform supine<>sit with mod assist PT Short Term Goal 2 (Week 1): Pt will perform bed<>chair transfer with +2 max assist (not using lift) PT Short Term Goal 3 (Week 1): Pt will perform sit<>stand using LRAD with +2 max assist (not using lift)  Skilled Therapeutic Interventions/Progress Updates:    Pt received supine in bed asleep, arousable but remains lethargic. Vitals assessed while in supine position, BP 146/101, HR 76, SpO2 95%. Per medical team HTN permissable at this time. Supine to sit with max A for BLE management and trunk control. Once in seated position pt appears more alert and alertness continues to improve throughout session. Session focus on static sitting balance EOM while pt eats his lunch. Pt initially requires max A for sitting balance EOB due to L lateral and posterior lean and pushing. Pt progresses to CGA for sitting balance by end of session. Pt is able to feed himself lunch while seated EOB with use of LUE and min A for steadying and setting up some items. Pt requires cues for safety for decreased size of bites and to completely swallow prior to taking another bite. Sit to stand with max A x 2 to stedy. Stedy transfer to w/c. Pt left seated in w/c in room with needs in reach, quick release belt and chair alarm in place, R lap tray in place at end of session.  Therapy Documentation Precautions:  Precautions Precautions: Fall Precaution Comments: pusher, right hemiparesis Restrictions Weight Bearing Restrictions: No    Therapy/Group: Individual Therapy   Excell Seltzer, PT, DPT  04/22/2020, 2:48 PM

## 2020-04-22 NOTE — Progress Notes (Signed)
[facility] PHYSICAL MEDICINE & REHABILITATION PROGRESS NOTE   Subjective/Complaints: Patient seen laying in bed this morning.  He states he slept well overnight.  He states he had good first day of therapies yesterday.  ROS: Denies CP, SOB, N/V/D  Objective:   No results found. Recent Labs    04/21/20 0647  WBC 8.9  HGB 12.8*  HCT 40.3  PLT 204   Recent Labs    04/21/20 0647  NA 137  K 3.7  CL 100  CO2 25  GLUCOSE 156*  BUN 16  CREATININE 1.30*  CALCIUM 9.3    Intake/Output Summary (Last 24 hours) at 04/22/2020 1525 Last data filed at 04/21/2020 2132 Gross per 24 hour  Intake 180 ml  Output --  Net 180 ml     Physical Exam: Vital Signs Blood pressure (!) 139/94, pulse 78, temperature 97.8 F (36.6 C), temperature source Oral, resp. rate 17, height 5\' 11"  (1.803 m), weight 98.7 kg, SpO2 100 %. Constitutional: No distress . Vital signs reviewed. HENT: Normocephalic.  Atraumatic. Eyes: EOMI. No discharge. Cardiovascular: No JVD. Respiratory: Normal effort.  No stridor. GI: Non-distended. Skin: Warm and dry.  Intact. Psych: Normal mood.  Normal behavior. Musc: No edema in extremities.  No tenderness in extremities. Neurologic: Alert Motor: LUE/LLE: 5/5 proximal distal RUE: 3/5 proximal to distal  RLE: HF, KE 3-/4 ADF 0/5  Dysarthria Right facial weakness  Assessment/Plan: 1. Functional deficits secondary to Left pontine infarct which require 3+ hours per day of interdisciplinary therapy in a comprehensive inpatient rehab setting.  Physiatrist is providing close team supervision and 24 hour management of active medical problems listed below.  Physiatrist and rehab team continue to assess barriers to discharge/monitor patient progress toward functional and medical goals  Care Tool:  Bathing    Body parts bathed by patient: Right arm, Chest, Abdomen, Right upper leg, Left upper leg, Face   Body parts bathed by helper: Right lower leg, Left lower leg,  Front perineal area, Buttocks, Left arm     Bathing assist Assist Level: 2 Helpers     Upper Body Dressing/Undressing Upper body dressing   What is the patient wearing?: Pull over shirt    Upper body assist Assist Level: Total Assistance - Patient < 25%    Lower Body Dressing/Undressing Lower body dressing      What is the patient wearing?: Incontinence brief, Pants     Lower body assist Assist for lower body dressing: 2 Helpers     Toileting Toileting    Toileting assist Assist for toileting: Dependent - Patient 0%     Transfers Chair/bed transfer  Transfers assist     Chair/bed transfer assist level: Dependent - mechanical lift     Locomotion Ambulation   Ambulation assist   Ambulation activity did not occur: Safety/medical concerns          Walk 10 feet activity   Assist  Walk 10 feet activity did not occur: Safety/medical concerns        Walk 50 feet activity   Assist Walk 50 feet with 2 turns activity did not occur: Safety/medical concerns         Walk 150 feet activity   Assist Walk 150 feet activity did not occur: Safety/medical concerns         Walk 10 feet on uneven surface  activity   Assist Walk 10 feet on uneven surfaces activity did not occur: Safety/medical concerns         Wheelchair  Assist Will patient use wheelchair at discharge?: (TBD)             Wheelchair 50 feet with 2 turns activity    Assist            Wheelchair 150 feet activity     Assist          Blood pressure (!) 139/94, pulse 78, temperature 97.8 F (36.6 C), temperature source Oral, resp. rate 17, height 5\' 11"  (1.803 m), weight 98.7 kg, SpO2 100 %.  Medical Problem List and Plan: 1.  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  Recommendations a  30-day cardiac event monitor as outpatient  Continue CIR 2.  Antithrombotics: -DVT/anticoagulation: Lovenox             -antiplatelet therapy: Aspirin 325 mg and Plavix 75 mg daily x3 months then Plavix alone 3. Pain Management: Voltaren gel 4 times daily, Neurontin 300 mg 3 times daily  Controlled on 6/5 4. Mood: Cymbalta 30 mg twice daily             -antipsychotic agents: Invega 3 mg daily 5. Neuropsych: This patient is capable of making decisions on his own behalf. 6. Skin/Wound Care: Routine skin checks 7. Fluids/Electrolytes/Nutrition: Routine in and outs with follow-up chemistries 8.  Diastolic congestive heart failure.  Monitor for any signs of fluid overload Filed Weights   04/20/20 1545 04/21/20 0636 04/22/20 0446  Weight: 98.7 kg 98.9 kg 98.7 kg   Stable on 6/5 9.  Diabetes mellitus with peripheral neuropathy.  Hemoglobin A1c 7.7.  SSI.    Glucophage 500 mg twice daily prior to admission, resumed on 6/4 CBG (last 3)  Recent Labs    04/21/20 2058 04/22/20 0621 04/22/20 1129  GLUCAP 157* 146* 117*   Elevated on 6/5, no changes today given recent addition 10.  Hypertension.  Patient on Coreg 25 mg twice daily, Norvasc 10 mg daily, hydralazine 25 mg 3 times daily, Cozaar 50 mg daily prior to admission.  Resume as needed Vitals:   04/22/20 0446 04/22/20 1502  BP: (!) 156/94 (!) 139/94  Pulse: 84 78  Resp: 18 17  Temp: 99.1 F (37.3 C) 97.8 F (36.6 C)  SpO2: 99% 100%   Amlodipine 5 started on 6/4  Remains elevated on 6/5 11.  Hyperlipidemia.  Lipitor 12.  History of polysubstance tobacco and alcohol abuse.  Urine drug screen negative.  Provide counseling 13.  Post stroke dysphagia  D1 thins, advance diet as tolerated  LOS: 2 days A FACE TO FACE EVALUATION WAS PERFORMED  Kyrel Leighton Lorie Phenix 04/22/2020, 3:25 PM

## 2020-04-22 NOTE — Progress Notes (Addendum)
Physical Therapy Session Note  Patient Details  Name: [name] MRN: [id number] Date of Birth: Jan 17, 1961  Today's Date: 04/22/2020 PT Individual Time: 0922-1023 PT Individual Time Calculation (min): 61 min  And Today's Date: 04/22/2020 PT Missed Time: 14 Minutes Missed Time Reason: Patient fatigue  Short Term Goals: Week 1:  PT Short Term Goal 1 (Week 1): Pt will perform supine<>sit with mod assist PT Short Term Goal 2 (Week 1): Pt will perform bed<>chair transfer with +2 max assist (not using lift) PT Short Term Goal 3 (Week 1): Pt will perform sit<>stand using LRAD with +2 max assist (not using lift)  Skilled Therapeutic Interventions/Progress Updates:    Pt received asleep, supine in bed but easily awakens and agreeable to therapy session. In supine, donned B LE TED hose max assist. Supine>sitting L EOB with max multimodal cuing for sequencing of logroll technique to increase pt participation with max assist for trunk upright. Sitting EOB doffed dirty shirt and donned clean shirt with max assist. Pt continues to remain drowsy. Sitting EOB with pt using L UE on bedrail for trunk control/sitting balance support with close supervision and repeated cuing for anterior trunk lean as pt having minor posterior LOB while therapist threaded LEs into pants and donned shoes max assist. Pt reports R great toe/metatarsal pain - repositioned and modified techniques to avoid increased pain. Sit>stand EOB>no AD with +2 max assist for lifting and balance due to strong pushing with L LE causing strong R lateral lean while +2 assist provided total assist to pull up pants over hips. Sit>stand EOB>RW with +2 max assist for lifting and balance due to continued pushing causing R lateral lean but slightly improved compared to without AD but still unsafe to attempt anything further than static standing without additional support. R squat pivot to w/c with +2 max assist for lifting/pivoting hips; multimodal  cuing for proper set-up and sequencing of transfer.  Transported to/from gym in w/c for time management and energy conservation. R squat pivot w/c>EOM with max assist of 1 and +2 mod assist for lifting/pivoting hips and max multimodal cuing for head/hips relationship and proper positioning of UEs and LEs. Sitting EOM pt noted to have continuous pushing through L LE causing L hip hike (pelvic obliquity) therefore attempted R UE NMR and pelvic weight shifting task via grasping cards with R hand from under R pelvis with manual facilitation and mirror feedback for technique then placing on L lower surface for L anterior weight shift - min assist for balance and max manual facilitation for pelvic motion. Pt reports increased L hip pain during this task - likely due to over activation and prolonged activation of muscles due to pushing. Sit>supine with max assist for LE management onto mat. In supine performed L LE hip abductor stretch, L knee to chest stretch, and piriformis stretch all x45minute each. Pt reports increasing fatigue. Supine>sitting R EOM with max assist for trunk upright and max cuing for sequencing. Once sitting EOM pt continues to report fatigue and requests to return to bed. L squat pivot to w/c with max assist of 1 and min assist of 2nd person for safety. Transported back to room. L squat pivot to EOB with max assist of 1 and mod assist of 2nd person as pt starting to dose off. Sit>supine with max assist. Pt left supine in bed with needs in reach and bed alarm on with pt already falling asleep. Missed 14 minutes of skilled physical therapy.  Therapy Documentation Precautions:  Precautions  Precautions: Fall Precaution Comments: pusher, right hemiparesis Restrictions Weight Bearing Restrictions: No  Pain:   Reports L hip pain - anticipate this is due to over activation and prolonged contraction of muscles from significant pushing - provided stretching. R great toe pain - avoided pressure to  this area.    Therapy/Group: Individual Therapy  Tawana Scale, PT, DPT 04/22/2020, 7:54 AM

## 2020-04-22 NOTE — Progress Notes (Addendum)
Occupational Therapy Session Note  Patient Details  Name: [name] MRN: [id number] Date of Birth: August 22, 1961  Today's Date: 04/22/2020 OT Individual Time: 1345-1500 OT Individual Time Calculation (min): 75 min    Short Term Goals: Week 1:  OT Short Term Goal 1 (Week 1): Pt will maintain midline balance EOB with no more than min assist during UB selfcare tasks. OT Short Term Goal 2 (Week 1): Pt will complete LB bathing sit to stand with max assist of one person. OT Short Term Goal 3 (Week 1): Pt will use the RUE as a gross assist for holding items to be opened with no more than min facilitation and min instructional cueing. OT Short Term Goal 4 (Week 1): Pt will complete UB dressing with mod assist for two consecutive sessions. OT Short Term Goal 5 (Week 1): Pt will complete squat pivot transfer to the drop arm commode with mod assist.  Skilled Therapeutic Interventions/Progress Updates:    1:1. Pt received in w/c reporting need to toilet. Pt reporting pain down LLE and RN alerted. Pt requires total A to manage clothing and urinal seated and keep positioning of urinal to prevent leaking onto pants. Pt completes seated dynavision seated back from light board for weight shifting onto R forearm on OT lap for dep proprioceptive input and dynamic balance in w/c. Pt completes lateral scoot transfer on SB in B directions with MAX A +2 to hold board and pt required to place RUE around OT to prevent pushing. Pt completes seated reaching task grasping clothespins/crossing midline and placing in far L ranges to improve midline orientation. Pt returns to w/c and completes tabletop NMR with arm skate after PROM of UE in all planes agains flexor synergy. Pt able to initiate int/ext rotation, elbow flex/ext and horizontal ab/adduciton with min A for full range. Pt able to completes AAROM elbow flexion and gravity assisted elbow extension pressing against OT with min resistance. Exited session wih tpt  setaed in bed, exit alarm on and call light in reach  Therapy Documentation Precautions:  Precautions Precautions: Fall Precaution Comments: pusher, right hemiparesis Restrictions Weight Bearing Restrictions: No General:   Vital Signs:  Pain:   ADL: ADL Eating: Minimal assistance Where Assessed-Eating: Wheelchair Grooming: Minimal assistance Where Assessed-Grooming: Wheelchair Upper Body Bathing: Moderate assistance Where Assessed-Upper Body Bathing: Edge of bed Lower Body Bathing: Other (comment)(total +2) Where Assessed-Lower Body Bathing: Other (Comment), Edge of bed(total +2) Upper Body Dressing: Maximal assistance Where Assessed-Upper Body Dressing: Edge of bed Lower Body Dressing: Other (Comment)(total +2) Toileting: Other (Comment)(total +2) Where Assessed-Toileting: Bedside Commode Toilet Transfer: Other (comment)(total +2) Toilet Transfer Method: Stand pivot Science writer: Art gallery manager    Praxis   Exercises:   Other Treatments:     Therapy/Group: Individual Therapy  Tonny Branch 04/22/2020, 12:53 PM

## 2020-04-22 NOTE — Progress Notes (Addendum)
Patient takes meds crushed with applesauce. Pharmacy consulted re Cymbal DR which cannot be opened and only comes in one formulation. Dr. Posey Pronto made aware and instructed RN to hold it tonight. Charge RN informed

## 2020-04-23 DIAGNOSIS — I5032 Chronic diastolic (congestive) heart failure: Secondary | ICD-10-CM

## 2020-04-23 LAB — GLUCOSE, CAPILLARY
Glucose-Capillary: 149 mg/dL — ABNORMAL HIGH (ref 70–99)
Glucose-Capillary: 162 mg/dL — ABNORMAL HIGH (ref 70–99)
Glucose-Capillary: 125 mg/dL — ABNORMAL HIGH (ref 70–99)
Glucose-Capillary: 165 mg/dL — ABNORMAL HIGH (ref 70–99)

## 2020-04-23 MED ORDER — AMLODIPINE BESYLATE 5 MG PO TABS
5.0000 mg | ORAL_TABLET | Freq: Once | ORAL | Status: AC
  Administered 2020-04-23: 5 mg via ORAL
  Filled 2020-04-23: qty 1

## 2020-04-23 MED ORDER — AMLODIPINE BESYLATE 10 MG PO TABS
10.0000 mg | ORAL_TABLET | Freq: Every day | ORAL | Status: DC
  Administered 2020-04-24 – 2020-05-16 (×23): 10 mg via ORAL
  Filled 2020-04-23 (×23): qty 1

## 2020-04-23 NOTE — Plan of Care (Signed)
  Problem: Consults Goal: RH STROKE PATIENT EDUCATION Description: See Patient Education module for education specifics  Outcome: Progressing Goal: Nutrition Consult-if indicated Description: Monitor intake with Min I assist for possible consult if needed Outcome: Progressing Goal: Diabetes Guidelines if Diabetic/Glucose > 140 Description: If diabetic or lab glucose is > 140 mg/dl - Initiate Diabetes/Hyperglycemia Guidelines & Document Interventions per MD orders Outcome: Progressing   Problem: RH BOWEL ELIMINATION Goal: RH STG MANAGE BOWEL WITH ASSISTANCE Description: STG Manage Bowel with Assistance Mod I assist  Outcome: Progressing   Problem: RH BLADDER ELIMINATION Goal: RH STG MANAGE BLADDER WITH ASSISTANCE Description: STG Manage Bladder With Assistance with Mod I assist Outcome: Progressing   Problem: RH SKIN INTEGRITY Goal: RH STG SKIN FREE OF INFECTION/BREAKDOWN Description: Skin to remain free of break down during rehab stay with min I assist Outcome: Progressing Goal: RH STG MAINTAIN SKIN INTEGRITY WITH ASSISTANCE Description: STG Maintain Skin Integrity With Assistance with Min I assist Outcome: Progressing   Problem: RH SAFETY Goal: RH STG ADHERE TO SAFETY PRECAUTIONS W/ASSISTANCE/DEVICE Description: STG Adhere to Safety Precautions With Assistance/Device Mod I assist with walker Outcome: Progressing   Problem: RH PAIN MANAGEMENT Goal: RH STG PAIN MANAGED AT OR BELOW [name] PAIN GOAL Description: Pain goal of 3 for 0-10 pain scale Outcome: Progressing

## 2020-04-23 NOTE — Progress Notes (Signed)
Pt has white coated tongue, oral care given. Pt did great eating meal but frequent coughing noted after  sips of water.

## 2020-04-23 NOTE — Progress Notes (Signed)
Called Dr. Posey Pronto to clarify Cymbalta order and inability to crush. Per Dr. Buford Dresser, discontinue Cymbalta.

## 2020-04-23 NOTE — Progress Notes (Signed)
[place] PHYSICAL MEDICINE & REHABILITATION PROGRESS NOTE   Subjective/Complaints: Patient seen sitting up in bed this morning.  He states he slept well overnight.  He denies complaints.  Overnight, called by nursing regarding Cymbalta and inability to crush.  ROS: Appears to deny CP, SOB, N/V/D  Objective:   No results found. Recent Labs    04/21/20 0647  WBC 8.9  HGB 12.8*  HCT 40.3  PLT 204   Recent Labs    04/21/20 0647  NA 137  K 3.7  CL 100  CO2 25  GLUCOSE 156*  BUN 16  CREATININE 1.30*  CALCIUM 9.3    Intake/Output Summary (Last 24 hours) at 04/23/2020 0630 Last data filed at 04/23/2020 1601 Gross per 24 hour  Intake 178 ml  Output 1050 ml  Net -872 ml     Physical Exam: Vital Signs Blood pressure (!) 161/101, pulse 70, temperature 97.7 F (36.5 C), temperature source Oral, resp. rate 17, height 5\' 11"  (1.803 m), weight 98.8 kg, SpO2 100 %. Constitutional: No distress . Vital signs reviewed. HENT: Normocephalic.  Atraumatic. Eyes: EOMI. No discharge. Cardiovascular: No JVD. Respiratory: Normal effort.  No stridor. GI: Non-distended. Skin: Warm and dry.  Intact. Psych: Normal mood.  Normal behavior. Musc: No edema in extremities.  No tenderness in extremities. Neurologic: Alert Motor: LUE/LLE: 5/5 proximal distal RUE: 3/5 proximal to distal, unchanged RLE: HF, KE 3-/4 ADF 0/5, unchanged Dysarthria, unchanged Right facial weakness  Assessment/Plan: 1. Functional deficits secondary to Left pontine infarct which require 3+ hours per day of interdisciplinary therapy in a comprehensive inpatient rehab setting.  Physiatrist is providing close team supervision and 24 hour management of active medical problems listed below.  Physiatrist and rehab team continue to assess barriers to discharge/monitor patient progress toward functional and medical goals  Care Tool:  Bathing    Body parts bathed by patient: Right arm, Chest, Abdomen, Right upper leg,  Left upper leg, Face   Body parts bathed by helper: Right lower leg, Left lower leg, Front perineal area, Buttocks, Left arm     Bathing assist Assist Level: 2 Helpers     Upper Body Dressing/Undressing Upper body dressing   What is the patient wearing?: Pull over shirt    Upper body assist Assist Level: Total Assistance - Patient < 25%    Lower Body Dressing/Undressing Lower body dressing      What is the patient wearing?: Incontinence brief     Lower body assist Assist for lower body dressing: 2 Helpers     Toileting Toileting    Toileting assist Assist for toileting: Dependent - Patient 0%     Transfers Chair/bed transfer  Transfers assist     Chair/bed transfer assist level: Maximal Assistance - Patient 25 - 49%     Locomotion Ambulation   Ambulation assist   Ambulation activity did not occur: Safety/medical concerns          Walk 10 feet activity   Assist  Walk 10 feet activity did not occur: Safety/medical concerns        Walk 50 feet activity   Assist Walk 50 feet with 2 turns activity did not occur: Safety/medical concerns         Walk 150 feet activity   Assist Walk 150 feet activity did not occur: Safety/medical concerns         Walk 10 feet on uneven surface  activity   Assist Walk 10 feet on uneven surfaces activity did not occur:  Safety/medical concerns         Wheelchair     Assist Will patient use wheelchair at discharge?: (TBD)             Wheelchair 50 feet with 2 turns activity    Assist            Wheelchair 150 feet activity     Assist          Blood pressure (!) 161/101, pulse 70, temperature 97.7 F (36.5 C), temperature source Oral, resp. rate 17, height 5\' 11"  (1.803 m), weight 98.8 kg, SpO2 100 %.  Medical Problem List and Plan: 1.  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  Recommendations a 30-day cardiac event monitor as outpatient  Continue CIR  PRAFO ordered 2.  Antithrombotics: -DVT/anticoagulation: Lovenox             -antiplatelet therapy: Aspirin 325 mg and Plavix 75 mg daily x3 months then Plavix alone 3. Pain Management: Voltaren gel 4 times daily, Neurontin 300 mg 3 times daily  Controlled on 6/6 4. Mood: Cymbalta 30 mg twice daily             -antipsychotic agents: Invega 3 mg daily 5. Neuropsych: This patient is capable of making decisions on his own behalf. 6. Skin/Wound Care: Routine skin checks 7. Fluids/Electrolytes/Nutrition: Routine in and outs with follow-up chemistries 8.  Diastolic congestive heart failure.  Monitor for any signs of fluid overload Filed Weights   04/21/20 0636 04/22/20 0446 04/23/20 0345  Weight: 98.9 kg 98.7 kg 98.8 kg   Stable on 6/6 9.  Diabetes mellitus with peripheral neuropathy.  Hemoglobin A1c 7.7.  SSI.    Glucophage 500 mg twice daily prior to admission, resumed on 6/4 CBG (last 3)  Recent Labs    04/22/20 1625 04/22/20 2047 04/23/20 0628  GLUCAP 155* 151* 149*   Elevated on 6/5, no changes today given recent addition 10.  Hypertension.  Patient on Coreg 25 mg twice daily, Norvasc 10 mg daily, hydralazine 25 mg 3 times daily, Cozaar 50 mg daily prior to admission.  Resume as needed Vitals:   04/23/20 0344 04/23/20 0355  BP: (!) 175/104 (!) 161/101  Pulse: 76 70  Resp: 17   Temp: 97.7 F (36.5 C)   SpO2: 100%    Amlodipine 5 started on 6/4, increase to 10 on 6/6  Continues to trend up on 6/6 11.  Hyperlipidemia.  Lipitor 12.  History of polysubstance tobacco and alcohol abuse.  Urine drug screen negative.  Provide counseling 13.  Post stroke dysphagia  D1 thins, advance diet as tolerated  LOS: 3 days A FACE TO FACE EVALUATION WAS PERFORMED  [name] Lorie Phenix 04/23/2020, 9:22 AM

## 2020-04-23 NOTE — Progress Notes (Signed)
Orthopedic Tech Progress Note Patient Details:  [name] 01-May-1961 [id number] Called in patients brace. Patient ID: [name], male   DOB: 08/27/1961, 59 y.o.   MRN: [id number]   Ellouise Newer 04/23/2020, 3:20 PM

## 2020-04-24 ENCOUNTER — Inpatient Hospital Stay (HOSPITAL_COMMUNITY): Payer: Medicaid Other | Admitting: Speech Pathology

## 2020-04-24 ENCOUNTER — Inpatient Hospital Stay (HOSPITAL_COMMUNITY): Payer: Medicaid Other | Admitting: Occupational Therapy

## 2020-04-24 ENCOUNTER — Inpatient Hospital Stay (HOSPITAL_COMMUNITY): Payer: Medicaid Other

## 2020-04-24 LAB — GLUCOSE, CAPILLARY
Glucose-Capillary: 134 mg/dL — ABNORMAL HIGH (ref 70–99)
Glucose-Capillary: 152 mg/dL — ABNORMAL HIGH (ref 70–99)
Glucose-Capillary: 143 mg/dL — ABNORMAL HIGH (ref 70–99)
Glucose-Capillary: 158 mg/dL — ABNORMAL HIGH (ref 70–99)

## 2020-04-24 MED ORDER — MUSCLE RUB 10-15 % EX CREA
TOPICAL_CREAM | Freq: Three times a day (TID) | CUTANEOUS | Status: DC
  Administered 2020-05-09 – 2020-05-16 (×2): 1 via TOPICAL
  Filled 2020-04-24: qty 85

## 2020-04-24 MED ORDER — CYCLOBENZAPRINE HCL 5 MG PO TABS
5.0000 mg | ORAL_TABLET | Freq: Three times a day (TID) | ORAL | Status: DC | PRN
  Administered 2020-04-24 – 2020-05-15 (×29): 5 mg via ORAL
  Filled 2020-04-24 (×31): qty 1

## 2020-04-24 MED ORDER — TROLAMINE SALICYLATE 10 % EX CREA: TOPICAL_CREAM | Freq: Three times a day (TID) | CUTANEOUS | Status: DC

## 2020-04-24 NOTE — Progress Notes (Signed)
Per nursing patient reporting LLE pain.  On exam lying in bed and reports that left leg is hurting all over and can't pinpoint area. Able to flex at hip and knee without difficulty. Pain left thigh and lower leg appears MS in nature. No edema, redness or pain with PF/DF. Will add muscle rub as well as flexeril prn to help manage symptoms.

## 2020-04-24 NOTE — ED Provider Notes (Signed)
[facility] Provider Note   CSN: [id number] Arrival date & time: 04/17/20  2130     History Chief Complaint  Patient presents with  . Code Stroke    [name] is a 59 y.o. male.  HPI    59 y.o. male with medical history significant for history of stroke with residual right-sided weakness and neurocognitive impairment, chronic combined systolic and diastolic CHF, type 2 diabetes, hypertension, hyperlipidemia, schizoaffective disorder, chronic left hip pain, and history of polysubstance use disorder who presents to the ED for evaluation of worsening right-sided weakness and dysarthria from his baseline.  Patient states he has chronic right-sided weakness at baseline secondary to his previous stroke.  He normally ambulates with the use of a walker and has been working with PT for chronic left hip pain.  Last known normal was sometime around noon 04/17/2020 when family noticed that patient was having difficulty keeping food in his mouth and was drooling more from the right side.Symptoms progressed over time.  Code stroke was activated and neuro team at the bedside.  Past Medical History:  Diagnosis Date  . Alcohol abuse   . Arthritis   . Crack cocaine use   . Depression   . Diabetes mellitus   . Drug abuse ([place])   . DTs (delirium tremens) ([place])    history of   . Gout   . Noncompliance with medication regimen   . Schizophrenia ([place])   . Stroke ([place])   . Systolic heart failure secondary to hypertension ([place])   . Uncontrolled hypertension     Patient Active Problem List   Diagnosis Date Noted  . Chronic diastolic congestive heart failure ([place])   . Dysphagia, post-stroke   . 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[place]) 04/20/2020  . CVA (cerebral vascular accident) ([place]) 04/18/2020  . Right sided weakness 04/17/2020  . History of CVA with residual deficit 04/17/2020  . Chronic combined systolic (congestive) and diastolic (congestive) heart  failure ([place]) 04/17/2020  . Substance use disorder 04/17/2020  . Abnormal EKG 02/07/2020  . Left hip pain 02/07/2020  . AKI (acute kidney injury) ([place]) 02/07/2020  . Systolic heart failure secondary to hypertension ([place])   . Schizophrenia ([place])   . Acute systolic heart failure ([place]) 02/02/2018  . Chest pain 01/31/2018  . Hypokalemia   . Schizoaffective disorder, bipolar type ([place]) 11/22/2017  . Trauma   . Tachycardia 03/03/2017  . Alcohol abuse   . Cocaine abuse ([place])   . Hypertensive urgency   . Tachycardia   . Cocaine abuse with cocaine-induced mood disorder ([place]) 12/05/2016  . Cerebrovascular disease 09/02/2015  . 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 Malignant hypertension 08/30/2015  . Alcohol use disorder, severe, dependence ([place]) 08/30/2015  . Alcohol withdrawal ([place]) 08/30/2015  . Alcohol abuse with alcohol-induced mood disorder ([place]) 08/30/2015  . Thrombocytopenia ([place]) 08/27/2015  . Tobacco use disorder 08/26/2015  . Hyperlipidemia associated with type 2 diabetes mellitus ([place]) 04/28/2015  . Schizoaffective disorder, depressive type ([place])   . Hypertension associated with diabetes ([place]) 08/09/2013  . Gout 08/09/2013  . Diabetes mellitus ([place]) 07/09/2012    Past Surgical History:  Procedure Laterality Date  . LEFT HEART CATH AND CORONARY ANGIOGRAPHY N/A 02/03/2018   Procedure: LEFT HEART CATH AND CORONARY ANGIOGRAPHY;  Surgeon: Jettie Booze, MD;  Location: [facility];  Service: Cardiovascular;  Laterality: N/A;  . MANDIBLE RECONSTRUCTION    . TONSILLECTOMY  Family History  Problem Relation Age of Onset  . Hypertension Other     Social History   Tobacco Use  . Smoking status: Current Every Day Smoker    Packs/day: 0.25    Types: Cigarettes  . Smokeless tobacco: Never Used  Substance Use Topics  . Alcohol use: Yes    Alcohol/week: 1.0 - 2.0 standard drinks    Types: 1 - 2 Cans of  beer per week    Comment: 2 cases of beer a day + liquour  . Drug use: Yes    Types: "Crack" cocaine, Cocaine    Home Medications Prior to Admission medications   Medication Sig Start Date End Date Taking? Authorizing Provider  acetaminophen (TYLENOL) 325 MG tablet Take 2 tablets (650 mg total) by mouth every 6 (six) hours as needed for mild pain (or Fever >/= 101). 02/10/20  Yes Sheikh, Omair Latif, DO  aspirin EC 81 MG tablet Take 81 mg by mouth daily.   Yes [provider]  carvedilol (COREG) 25 MG tablet Take 1 tablet (25 mg total) by mouth 2 (two) times daily with a meal. 02/10/20  Yes Sheikh, Omair Latif, DO  diclofenac Sodium (VOLTAREN) 1 % GEL Apply 2 g topically 4 (four) times daily. 02/10/20  Yes Sheikh, Omair Latif, DO  ergocalciferol (VITAMIN D2) 1.25 MG (50000 UT) capsule Take 1 capsule by mouth once a week. 03/03/20  Yes [provider]  FLUoxetine (PROZAC) 10 MG capsule Take 10 mg by mouth daily. 03/03/20  Yes [provider]  folic acid (FOLVITE) 1 MG tablet Take 1 mg by mouth daily. 03/03/20  Yes [provider]  gabapentin (NEURONTIN) 300 MG capsule Take 1 capsule (300 mg total) by mouth 3 (three) times daily. 02/19/18  Yes Patrecia Pour, NP  hydrOXYzine (ATARAX/VISTARIL) 25 MG tablet Take 25 mg by mouth 2 (two) times daily. 03/03/20  Yes [provider]  metFORMIN (GLUCOPHAGE) 500 MG tablet Take 1 tablet (500 mg total) by mouth 2 (two) times daily with a meal. 02/10/20  Yes Sheikh, Omair Latif, DO  mirtazapine (REMERON) 7.5 MG tablet Take 7.5 mg by mouth at bedtime. 03/03/20  Yes [provider]  Multiple Vitamin (DAILY-VITE) TABS Take 1 tablet by mouth daily. 03/03/20  Yes [provider]  paliperidone (INVEGA) 3 MG 24 hr tablet Take 1 tablet (3 mg total) by mouth daily. Patient taking differently: Take 6 mg by mouth daily.  02/15/18  Yes Patrecia Pour, NP  pantoprazole (PROTONIX) 20 MG tablet Take 20 mg by mouth daily.  03/03/20  Yes [provider]  SENNA PLUS 8.6-50 MG tablet Take 1 tablet by mouth daily. 03/03/20  Yes [provider]  traMADol (ULTRAM) 50 MG tablet Take 50 mg by mouth every 6 (six) hours as needed for moderate pain.    Yes [provider]  atorvastatin (LIPITOR) 40 MG tablet Take 1 tablet (40 mg total) by mouth daily. 04/21/20   Ghimire, Henreitta Leber, MD  clopidogrel (PLAVIX) 75 MG tablet Take 1 tablet (75 mg total) by mouth daily. Patient not taking: Reported on 04/18/2020 02/11/20   Raiford Noble Latif, DO  DULoxetine (CYMBALTA) 30 MG capsule Take 1 capsule (30 mg total) by mouth 2 (two) times daily. Patient not taking: Reported on 04/18/2020 02/15/18   Patrecia Pour, NP    Allergies    Patient has no known allergies.  Review of Systems   Review of Systems  Constitutional: Positive for activity change.  Neurological: Positive for speech difficulty and weakness.  All other systems reviewed and are negative.   Physical Exam Updated Vital Signs BP (!) 166/127 (BP Location: Left Arm)   Pulse 87   Temp 98 F (36.7 C) (Oral)   Resp 16   Ht 5\' 11"  (1.803 m)   Wt 100 kg   SpO2 99%   BMI 30.75 kg/m   Physical Exam Vitals reviewed.  Constitutional:      Appearance: He is well-developed.  HENT:     Head: Normocephalic and atraumatic.  Eyes:     Pupils: Pupils are equal, round, and reactive to light.  Neck:     Vascular: No JVD.  Cardiovascular:     Rate and Rhythm: Normal rate and regular rhythm.  Pulmonary:     Effort: Pulmonary effort is normal. No respiratory distress.     Breath sounds: Normal breath sounds. No wheezing.  Abdominal:     General: Bowel sounds are normal. There is no distension.     Palpations: Abdomen is soft.     Tenderness: There is no abdominal tenderness. There is no guarding or rebound.  Musculoskeletal:     Cervical back: Normal range of motion and neck supple.  Skin:    General: Skin is warm and dry.  Neurological:      Mental Status: He is alert and oriented to person, place, and time.     Cranial Nerves: No cranial nerve deficit.     Motor: Weakness present.     Coordination: Coordination normal.     Comments: NIHSS - 0 No objective sensory deficits, Motor strength upper and lower extremity 4+ and equal Normal cerebellar exam     ED Results / Procedures / Treatments   Labs (all labs ordered are listed, but only abnormal results are displayed) Labs Reviewed  CBC - Abnormal; Notable for the following components:      Result Value   Hemoglobin 12.5 (*)    All other components within normal limits  COMPREHENSIVE METABOLIC PANEL - Abnormal; Notable for the following components:   Glucose, Bld 143 (*)    BUN 23 (*)    Creatinine, Ser 1.40 (*)    GFR calc non Af Amer 55 (*)    All other components within normal limits  URINALYSIS, ROUTINE W REFLEX MICROSCOPIC - Abnormal; Notable for the following components:   Protein, ur 100 (*)    All other components within normal limits  HEMOGLOBIN A1C - Abnormal; Notable for the following components:   Hgb A1c MFr Bld 7.7 (*)    All other components within normal limits  LIPID PANEL - Abnormal; Notable for the following components:   Triglycerides 183 (*)    LDL Cholesterol 114 (*)    All other components within normal limits  GLUCOSE, CAPILLARY - Abnormal; Notable for the following components:   Glucose-Capillary 148 (*)    All other components within normal limits  RENAL FUNCTION PANEL - Abnormal; Notable for the following components:   Potassium 3.3 (*)    Glucose, Bld 167 (*)    All other components within normal limits  GLUCOSE, CAPILLARY - Abnormal; Notable for the following components:   Glucose-Capillary 160 (*)    All other components within normal limits  GLUCOSE, CAPILLARY - Abnormal; Notable for the following components:   Glucose-Capillary 182 (*)    All other components within normal limits  GLUCOSE, CAPILLARY - Abnormal; Notable for the  following components:   Glucose-Capillary 128 (*)  All other components within normal limits  GLUCOSE, CAPILLARY - Abnormal; Notable for the following components:   Glucose-Capillary 178 (*)    All other components within normal limits  GLUCOSE, CAPILLARY - Abnormal; Notable for the following components:   Glucose-Capillary 185 (*)    All other components within normal limits  GLUCOSE, CAPILLARY - Abnormal; Notable for the following components:   Glucose-Capillary 152 (*)    All other components within normal limits  GLUCOSE, CAPILLARY - Abnormal; Notable for the following components:   Glucose-Capillary 116 (*)    All other components within normal limits  GLUCOSE, CAPILLARY - Abnormal; Notable for the following components:   Glucose-Capillary 163 (*)    All other components within normal limits  GLUCOSE, CAPILLARY - Abnormal; Notable for the following components:   Glucose-Capillary 148 (*)    All other components within normal limits  GLUCOSE, CAPILLARY - Abnormal; Notable for the following components:   Glucose-Capillary 180 (*)    All other components within normal limits  I-STAT CHEM 8, ED - Abnormal; Notable for the following components:   BUN 23 (*)    Creatinine, Ser 1.40 (*)    Glucose, Bld 141 (*)    Hemoglobin 12.9 (*)    HCT 38.0 (*)    All other components within normal limits  CBG MONITORING, ED - Abnormal; Notable for the following components:   Glucose-Capillary 141 (*)    All other components within normal limits  SARS CORONAVIRUS 2 BY RT PCR (HOSPITAL ORDER, [facility])  PROTIME-INR  APTT  DIFFERENTIAL  RAPID URINE DRUG SCREEN, HOSP PERFORMED  ETHANOL    EKG EKG Interpretation  Date/Time:  Monday Apr 17 2020 22:07:18 EDT Ventricular Rate:  78 PR Interval:    QRS Duration: 111 QT Interval:  359 QTC Calculation: 409 R Axis:   -5 Text Interpretation: Sinus rhythm Probable left atrial enlargement Low voltage, extremity  leads Borderline ST elevation, anterior leads No acute changes No significant change since last tracing Confirmed by Varney Biles [id number]) on 04/17/2020 10:57:30 PM   Radiology No results found.  Procedures Procedures (including critical care time)  Medications Ordered in ED Medications  0.9 %  sodium chloride infusion ( Intravenous Stopping Infusion hung by another clincian 04/18/20 1900)  sodium chloride flush (NS) 0.9 % injection 3 mL (3 mLs Intravenous Given 04/18/20 0149)   stroke: mapping our early stages of recovery book ( Does not apply Given 04/18/20 0357)  iohexol (OMNIPAQUE) 350 MG/ML injection 75 mL (75 mLs Intravenous Contrast Given 04/18/20 0757)    ED Course  I have reviewed the triage vital signs and the nursing notes.  Pertinent labs & imaging results that were available during my care of the patient were reviewed by me and considered in my medical decision making (see chart for details).    MDM Rules/Calculators/A&P                      Pt comes in with cc of worsening weakness and slurred speech. Pt has hx of stroke - symptoms have worsened. Neuro consulted. Basic metabolic workup initiated. No underlying infection per hx. Will need admission.  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

## 2020-04-24 NOTE — Progress Notes (Signed)
Physical Therapy Session Note  Patient Details  Name: [name] MRN: [id number] Date of Birth: August 14, 1961  Today's Date: 04/24/2020 PT Individual Time: 0165-5374 PT Individual Time Calculation (min): 72 min   Short Term Goals: Week 1:  PT Short Term Goal 1 (Week 1): Pt will perform supine<>sit with mod assist PT Short Term Goal 2 (Week 1): Pt will perform bed<>chair transfer with +2 max assist (not using lift) PT Short Term Goal 3 (Week 1): Pt will perform sit<>stand using LRAD with +2 max assist (not using lift)  Skilled Therapeutic Interventions/Progress Updates:    Pt supine in bed upon PT arrival, agreeable to therapy tx and denies pain . Therapist assisted pt to don pants, looped legs through pants total assist, pt attempted to perform bridging to pull pants over hips but unable to fully clear hips even with assist from therapist. Pt performed rolling in each direction with mod assist to pull pants over hips. Pt transferred to sitting EOB this session with mod assist and performed slideboard transfer this session to the w/c towards the L with max assist, reaching across with L UE for w/c armrest. Pt transported to the gym in w/c. Pt performed sit<>stand using L rail with max assist, in standing cues for L weightshift to limit pushing. Pt performed x 3 sit<>stands this session facing the wall with B UE support on the rail and max assist, facilitation for R hip.knee extension in standing, while standing pt worked on overhead L lateral reaching activity to grasp horseshoes off the top of the mirror. Pt limited this session by L thigh pain, therapist performed quad stretch x 2 while sitting in w/c, pt willing to continue working on standing despite pain. Pt reports incontinence of bladder. Pt transported back to room, performed sit<>stand within the stedy with max assist, +2 providing assist to don/doff pants/brief, pt with R lean throughout this. Pt transported back to the fym used  kinetron x 2 bouts today while seated in w/c working on NMR and hip extensor activation. Pt tried working on w/c propulsion using B Ue's, difficulty grasping/propelling with R UE. Transported back to room and left in w/c with needs in reach and chair alarm set.   Therapy Documentation Precautions:  Precautions Precautions: Fall Precaution Comments: pusher, right hemiparesis Restrictions Weight Bearing Restrictions: No    Therapy/Group: Individual Therapy  Netta Corrigan, PT, DPT, CSRS 04/24/2020, 8:00 AM

## 2020-04-24 NOTE — Progress Notes (Signed)
Speech Language Pathology Daily Session Note  Patient Details  Name: [name] MRN: [id number] Date of Birth: 1961/01/17  Today's Date: 04/24/2020 SLP Individual Time: 8325-4982 SLP Individual Time Calculation (min): 61 min  Short Term Goals: Week 1: SLP Short Term Goal 1 (Week 1): Pt will use small bites and sips to mitigate difficulty with oral manipulation and clearance as well as overt s/s of aspiration with dys 1 textures and thin liquids. SLP Short Term Goal 2 (Week 1): Pt will consume therapeutic trials of dys 2 textures with min cues to clear solids from the oral cavity in a timely manner over 3 consecutive sessions prior to advancement. SLP Short Term Goal 3 (Week 1): Pt will utilize slow rate, overarticulation, and increased vocal intensity to achieve intelligibility at the word level with mod assist verbal cues. SLP Short Term Goal 4 (Week 1): Pt will recall daily information with min cues for use of external aids. SLP Short Term Goal 5 (Week 1): Pt will complete basic, familiar tasks with min cues for functional problem solving.  Skilled Therapeutic Interventions: Pt was seen for skilled ST targeting dysphagia and speech goals. During skilled observation of pt consuming current diet (Dys 1/thin), pt demonstrated overall very weak lingual manipulation and prolonged oral transit of pureed solids, however excellent oral clearance. Min A verbal cues required for use of smaller bite size. Pt exhibited throat clear in ~70% of sips of thin via cup and 1 additional prolonged explosive coughing episode. Pt with only 2 subtle throat clears with nectar thick juice. Discussed comfort level of thin intake and frequency of explosive coughing with thins. Pt and SLP in agreement that pt should downgrade to nectar thick liquids at this time to increase ease of intake and prevent further respiratory decline given very limited level of physical mobility right now and baseline increased WOB today.  SLP also provided upgraded trial of advanced solids to continue to work toward solid advancement. Mastication of Dys 2 solids was slightly prolonged, but pt still achieved good oral clearance. Recommend continue puree solids, but downgrade to nectar thick liquids. Free water protocol may also be initiated to allow for small sips of thin H2O intake in between meals. Speech interventions focused on functional communication and increasing intelligibility through use of increased vocal intensity and overarticulation strategies - pt required overall Mod A to detect verbal errors and implement strategies to achieve ~75% intelligibility at the phrase level. Pt left laying in bed with alarm set and needs within reach. Continue per current plan of care.          Pain Pain Assessment Pain Score: Asleep  Therapy/Group: Individual Therapy  Arbutus Leas 04/24/2020, 6:57 AM

## 2020-04-24 NOTE — Progress Notes (Signed)
Occupational Therapy Session Note  Patient Details  Name: [name] MRN: [id number] Date of Birth: Dec 05, 1960  Today's Date: 04/24/2020 OT Individual Time: 0175-1025 OT Individual Time Calculation (min): 62 min    Short Term Goals: Week 1:  OT Short Term Goal 1 (Week 1): Pt will maintain midline balance EOB with no more than min assist during UB selfcare tasks. OT Short Term Goal 2 (Week 1): Pt will complete LB bathing sit to stand with max assist of one person. OT Short Term Goal 3 (Week 1): Pt will use the RUE as a gross assist for holding items to be opened with no more than min facilitation and min instructional cueing. OT Short Term Goal 4 (Week 1): Pt will complete UB dressing with mod assist for two consecutive sessions. OT Short Term Goal 5 (Week 1): Pt will complete squat pivot transfer to the drop arm commode with mod assist.  Skilled Therapeutic Interventions/Progress Updates:    Treatment session with focus on midline orientation, trunk control, functional mobility, and Rt NMR.  Pt received upright in w/c agreeable to therapy session.  Pt required increased time due to speech intelligibility issues, but asking for family member's phone number. Engaged in slide board transfer to Lt to therapy mat with focus on anterior weight shift for lift off to complete transfer.  Pt required max assist +2 for transfer due to difficulty with weight shifting and sequencing transfer.  Pt reports pain in LLE, notified RN who provided pain meds during session. Therapist propped pt's LLE on therapy step with pt reporting decreased pain, however increasing pushing to Rt due to uneven foot placement.  Utilized Geologist, engineering for visual feedback for sitting balance and midline orientation.  Engaged in reaching activity to obtain clothespins on mirror first with Lt hand to challenge sitting balance and crossing midline progressing to use of RUE.  Therapist provided tactile support and facilitation for  reaching with RUE, pt able to grasp and pinch clothespins with RUE to remove from "clothesline".  Pt reports increased pain in back in unsupported sitting, requesting to return to w/c.  Completed slide board transfer to Rt with max assist +2. Pt requested to go outside.  Therapist educated on w/c management when entering/exiting elevators as well as energy conservation strategies in regards to scheduling appts or outings around weather (specifically heat and humidity).  Pt very appreciative of time outside.  Returned to room and left upright in w/c with seat belt alarm on, half lap tray for improved positioning/awareness of RUE, and all needs in reach.  Therapy Documentation Precautions:  Precautions Precautions: Fall Precaution Comments: pusher, right hemiparesis Restrictions Weight Bearing Restrictions: No General:   Vital Signs: Therapy Vitals Temp: 98.5 F (36.9 C) Pulse Rate: 79 Resp: 17 BP: (!) 129/99 Patient Position (if appropriate): Sitting Oxygen Therapy SpO2: 100 % O2 Device: Room Air Pain: Pain Assessment Pain Scale: Faces Pain Score: 0-No pain Faces Pain Scale: Hurts even more Pain Type: Acute pain Pain Location: Leg Pain Orientation: Left Pain Descriptors / Indicators: Aching Pain Frequency: Intermittent Pain Onset: With Activity Pain Intervention(s): Medication (See eMAR)   Therapy/Group: Individual Therapy  Simonne Come 04/24/2020, 3:02 PM

## 2020-04-24 NOTE — Care Management (Signed)
Patient ID: [name], male   DOB: November 27, 1960, 59 y.o.   MRN: [id number] Met with patient to review CM role and collaboration with SW-Christina for discharge. Reviewed risks for secondary stroke including HTN, HLD, DM, Smoking and HF. Reviewed handouts for patient and daughter to use as references at discharge for dietary modifications and review of medications, to manage HTN, HLD and DM. Patient reports this is his third stoke and he is finished with smoking," doesn't want to mess with cigarettes any more". Reports he was not aware of HF and need to check weights daily. Reviewed continued education with patient and daughter for discharge including HH/CMM diet and DAPT therapy.

## 2020-04-24 NOTE — Progress Notes (Signed)
Patient had cough each sips of thin water from cup. RN offered nectar thick lemon water instead and patient tolerated better.

## 2020-04-24 NOTE — Progress Notes (Signed)
[place] PHYSICAL MEDICINE & REHABILITATION PROGRESS NOTE   Subjective/Complaints:  Discussed exercise outside of therapy  ROS: Appears to deny CP, SOB, N/V/D  Objective:   No results found. No results for input(s): WBC, HGB, HCT, PLT in the last 72 hours. No results for input(s): NA, K, CL, CO2, GLUCOSE, BUN, CREATININE, CALCIUM in the last 72 hours.  Intake/Output Summary (Last 24 hours) at 04/24/2020 0912 Last data filed at 04/24/2020 0648 Gross per 24 hour  Intake 160 ml  Output 600 ml  Net -440 ml     Physical Exam: Vital Signs Blood pressure (!) 156/99, pulse 77, temperature 98.5 F (36.9 C), temperature source Oral, resp. rate 16, height 5\' 11"  (1.803 m), weight 98.8 kg, SpO2 100 %.  General: No acute distress Mood and affect are appropriate Heart: Regular rate and rhythm no rubs murmurs or extra sounds Lungs: Clear to auscultation, breathing unlabored, no rales or wheezes Abdomen: Positive bowel sounds, soft nontender to palpation, nondistended Extremities: No clubbing, cyanosis, or edema Skin: No evidence of breakdown, no evidence of rash  Motor: LUE/LLE: 5/5 proximal distal RUE: 3/5 proximal to distal, unchanged, can touch top of head  RLE: HF, KE 3-/4 ADF 0/5, unchanged Dysarthria, unchanged Right facial weakness  Assessment/Plan: 1. Functional deficits secondary to Left pontine infarct which require 3+ hours per day of interdisciplinary therapy in a comprehensive inpatient rehab setting.  Physiatrist is providing close team supervision and 24 hour management of active medical problems listed below.  Physiatrist and rehab team continue to assess barriers to discharge/monitor patient progress toward functional and medical goals  Care Tool:  Bathing    Body parts bathed by patient: Right arm, Chest, Abdomen, Right upper leg, Left upper leg, Face   Body parts bathed by helper: Right lower leg, Left lower leg, Front perineal area, Buttocks, Left arm      Bathing assist Assist Level: 2 Helpers     Upper Body Dressing/Undressing Upper body dressing   What is the patient wearing?: Pull over shirt    Upper body assist Assist Level: Total Assistance - Patient < 25%    Lower Body Dressing/Undressing Lower body dressing      What is the patient wearing?: Incontinence brief     Lower body assist Assist for lower body dressing: 2 Helpers     Toileting Toileting    Toileting assist Assist for toileting: Maximal Assistance - Patient 25 - 49%     Transfers Chair/bed transfer  Transfers assist     Chair/bed transfer assist level: Maximal Assistance - Patient 25 - 49%     Locomotion Ambulation   Ambulation assist   Ambulation activity did not occur: Safety/medical concerns          Walk 10 feet activity   Assist  Walk 10 feet activity did not occur: Safety/medical concerns        Walk 50 feet activity   Assist Walk 50 feet with 2 turns activity did not occur: Safety/medical concerns         Walk 150 feet activity   Assist Walk 150 feet activity did not occur: Safety/medical concerns         Walk 10 feet on uneven surface  activity   Assist Walk 10 feet on uneven surfaces activity did not occur: Safety/medical concerns         Wheelchair     Assist Will patient use wheelchair at discharge?: (TBD)  Wheelchair 50 feet with 2 turns activity    Assist            Wheelchair 150 feet activity     Assist          Blood pressure (!) 156/99, pulse 77, temperature 98.5 F (36.9 C), temperature source Oral, resp. rate 16, height 5\' 11"  (1.803 m), weight 98.8 kg, SpO2 100 %.  Medical Problem List and Plan: 1.  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  Recommendations a 30-day cardiac event monitor  as outpatient  Continue CIR  PRAFO ordered 2.  Antithrombotics: -DVT/anticoagulation: Lovenox             -antiplatelet therapy: Aspirin 325 mg and Plavix 75 mg daily x3 months then Plavix alone 3. Pain Management: Voltaren gel 4 times daily, Neurontin 300 mg 3 times daily  Controlled on 6/7 4. Mood: Cymbalta 30 mg twice daily             -antipsychotic agents: Invega 3 mg daily 5. Neuropsych: This patient is capable of making decisions on his own behalf. 6. Skin/Wound Care: Routine skin checks 7. Fluids/Electrolytes/Nutrition: Routine in and outs with follow-up chemistries 8.  Diastolic congestive heart failure.  Monitor for any signs of fluid overload Filed Weights   04/22/20 0446 04/23/20 0345 04/24/20 0352  Weight: 98.7 kg 98.8 kg 98.8 kg   Stable on 6/7 9.  Diabetes mellitus with peripheral neuropathy.  Hemoglobin A1c 7.7.  SSI.    Glucophage 500 mg twice daily prior to admission, resumed on 6/4 CBG (last 3)  Recent Labs    04/23/20 1640 04/23/20 2056 04/24/20 0557  GLUCAP 125* 165* 134*   Elevated on 6/5, no changes today given recent addition 10.  Hypertension.  Patient on Coreg 25 mg twice daily, Norvasc 10 mg daily, hydralazine 25 mg 3 times daily, Cozaar 50 mg daily prior to admission.  Resume as needed Vitals:   04/23/20 2057 04/24/20 0352  BP: (!) 137/95 (!) 156/99  Pulse: 72 77  Resp:  16  Temp:  98.5 F (36.9 C)  SpO2:  100%   Amlodipine 5 started on 6/4, increase to 10 on 6/6  Continues to trend up on 6/7 should see effect of amlodipine dose increase in 1-2 more days 11.  Hyperlipidemia.  Lipitor 12.  History of polysubstance tobacco and alcohol abuse.  Urine drug screen negative.  Provide counseling 13.  Post stroke dysphagia  D1 thins, advance diet as tolerated  LOS: 4 days A FACE TO FACE EVALUATION WAS PERFORMED  Charlett Blake 04/24/2020, 9:12 AM

## 2020-04-25 ENCOUNTER — Inpatient Hospital Stay (HOSPITAL_COMMUNITY): Payer: Medicaid Other

## 2020-04-25 ENCOUNTER — Encounter (HOSPITAL_COMMUNITY): Payer: Medicaid Other | Admitting: Psychology

## 2020-04-25 ENCOUNTER — Inpatient Hospital Stay (HOSPITAL_COMMUNITY): Payer: Medicaid Other | Admitting: Physical Therapy

## 2020-04-25 ENCOUNTER — Inpatient Hospital Stay (HOSPITAL_COMMUNITY): Payer: Medicaid Other | Admitting: Occupational Therapy

## 2020-04-25 ENCOUNTER — Inpatient Hospital Stay (HOSPITAL_COMMUNITY): Payer: Medicaid Other | Admitting: Speech Pathology

## 2020-04-25 DIAGNOSIS — F250 Schizoaffective disorder, bipolar type: Secondary | ICD-10-CM

## 2020-04-25 LAB — GLUCOSE, CAPILLARY
Glucose-Capillary: 167 mg/dL — ABNORMAL HIGH (ref 70–99)
Glucose-Capillary: 144 mg/dL — ABNORMAL HIGH (ref 70–99)
Glucose-Capillary: 129 mg/dL — ABNORMAL HIGH (ref 70–99)
Glucose-Capillary: 146 mg/dL — ABNORMAL HIGH (ref 70–99)

## 2020-04-25 MED ORDER — GABAPENTIN 400 MG PO CAPS
400.0000 mg | ORAL_CAPSULE | Freq: Three times a day (TID) | ORAL | Status: DC
  Administered 2020-04-25 – 2020-04-27 (×9): 400 mg via ORAL
  Filled 2020-04-25 (×9): qty 1

## 2020-04-25 MED ORDER — GABAPENTIN 400 MG PO CAPS
400.0000 mg | ORAL_CAPSULE | Freq: Three times a day (TID) | ORAL | Status: DC
  Filled 2020-04-25: qty 1

## 2020-04-25 NOTE — Progress Notes (Signed)
[facility] PHYSICAL MEDICINE & REHABILITATION PROGRESS NOTE   Subjective/Complaints:  Appreciate PA note , pt states that he has had chronic LLE pain since 2nd CVA.  Extends from L hip to left leg ROS: Appears to deny CP, SOB, N/V/D  Objective:   No results found. No results for input(s): WBC, HGB, HCT, PLT in the last 72 hours. No results for input(s): NA, K, CL, CO2, GLUCOSE, BUN, CREATININE, CALCIUM in the last 72 hours.  Intake/Output Summary (Last 24 hours) at 04/25/2020 0742 Last data filed at 04/25/2020 0015 Gross per 24 hour  Intake 630 ml  Output 500 ml  Net 130 ml     Physical Exam: Vital Signs Blood pressure (!) 161/95, pulse 78, temperature 98.4 F (36.9 C), resp. rate 16, height 5\' 11"  (1.803 m), weight 97.7 kg, SpO2 100 %.   General: No acute distress Mood and affect are appropriate Heart: Regular rate and rhythm no rubs murmurs or extra sounds Lungs: Clear to auscultation, breathing unlabored, no rales or wheezes Abdomen: Positive bowel sounds, soft nontender to palpation, nondistended Extremities: No clubbing, cyanosis, or edema Skin: No evidence of breakdown, no evidence of rash   Motor: LUE/LLE: 5/5 proximal distal RUE: 3/5 proximal to distal, unchanged, can touch top of head  RLE: HF, KE 3-/4 ADF 0/5, unchanged Dysarthria, unchanged Right facial weakness  Assessment/Plan: 1. Functional deficits secondary to Left pontine infarct which require 3+ hours per day of interdisciplinary therapy in a comprehensive inpatient rehab setting.  Physiatrist is providing close team supervision and 24 hour management of active medical problems listed below.  Physiatrist and rehab team continue to assess barriers to discharge/monitor patient progress toward functional and medical goals  Care Tool:  Bathing    Body parts bathed by patient: Right arm, Chest, Abdomen, Right upper leg, Left upper leg, Face   Body parts bathed by helper: Right lower leg, Left lower  leg, Front perineal area, Buttocks, Left arm     Bathing assist Assist Level: 2 Helpers     Upper Body Dressing/Undressing Upper body dressing   What is the patient wearing?: Pull over shirt    Upper body assist Assist Level: Total Assistance - Patient < 25%    Lower Body Dressing/Undressing Lower body dressing      What is the patient wearing?: Incontinence brief     Lower body assist Assist for lower body dressing: 2 Helpers     Toileting Toileting    Toileting assist Assist for toileting: Maximal Assistance - Patient 25 - 49%     Transfers Chair/bed transfer  Transfers assist     Chair/bed transfer assist level: Maximal Assistance - Patient 25 - 49%     Locomotion Ambulation   Ambulation assist   Ambulation activity did not occur: Safety/medical concerns          Walk 10 feet activity   Assist  Walk 10 feet activity did not occur: Safety/medical concerns        Walk 50 feet activity   Assist Walk 50 feet with 2 turns activity did not occur: Safety/medical concerns         Walk 150 feet activity   Assist Walk 150 feet activity did not occur: Safety/medical concerns         Walk 10 feet on uneven surface  activity   Assist Walk 10 feet on uneven surfaces activity did not occur: Safety/medical concerns         Wheelchair     Assist  Will patient use wheelchair at discharge?: No(TBD)             Wheelchair 50 feet with 2 turns activity    Assist            Wheelchair 150 feet activity     Assist          Blood pressure (!) 161/95, pulse 78, temperature 98.4 F (36.9 C), resp. rate 16, height 5\' 11"  (1.803 m), weight 97.7 kg, SpO2 100 %.  Medical Problem List and Plan: 1.  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  Recommendations a  30-day cardiac event monitor as outpatient  Continue CIR  PRAFO ordered 2.  Antithrombotics: -DVT/anticoagulation: Lovenox             -antiplatelet therapy: Aspirin 325 mg and Plavix 75 mg daily x3 months then Plavix alone 3. Pain Management: Voltaren gel 4 times daily, Neurontin 300 mg 3 times daily  Controlled on 6/7 LLE pain chronic with normal L hip MRI 20-19, ? CVA related vs sciatic, increase gabapentin, check lumbar films to see if sig disc height loss in lumbar area  4. Mood: Cymbalta 30 mg twice daily             -antipsychotic agents: Invega 3 mg daily 5. Neuropsych: This patient is capable of making decisions on his own behalf. 6. Skin/Wound Care: Routine skin checks 7. Fluids/Electrolytes/Nutrition: Routine in and outs with follow-up chemistries 8.  Diastolic congestive heart failure.  Monitor for any signs of fluid overload Filed Weights   04/23/20 0345 04/24/20 0352 04/25/20 0516  Weight: 98.8 kg 98.8 kg 97.7 kg   Stable on 6/7 9.  Diabetes mellitus with peripheral neuropathy.  Hemoglobin A1c 7.7.  SSI.    Glucophage 500 mg twice daily prior to admission, resumed on 6/4 CBG (last 3)  Recent Labs    04/24/20 1704 04/24/20 2119 04/25/20 0625  GLUCAP 143* 158* 167*   Elevated on 6/5, no changes today given recent addition 10.  Hypertension.  Patient on Coreg 25 mg twice daily, Norvasc 10 mg daily, hydralazine 25 mg 3 times daily, Cozaar 50 mg daily prior to admission.  Resume as needed Vitals:   04/24/20 2028 04/25/20 0438  BP: 119/79 (!) 161/95  Pulse: 69 78  Resp: 18 16  Temp: 97.8 F (36.6 C) 98.4 F (36.9 C)  SpO2: 100% 100%   Amlodipine 5 started on 6/4, increase to 10 on 6/7  Continues to trend up on 6/7 should see effect of amlodipine dose increase in 1-2 more days 11.  Hyperlipidemia.  Lipitor 12.  History of polysubstance tobacco and alcohol abuse.  Urine drug screen negative.  Provide counseling 13.  Post stroke dysphagia  D1 thins, advance diet as  tolerated  LOS: 5 days A FACE TO FACE EVALUATION WAS PERFORMED  Charlett Blake 04/25/2020, 7:42 AM

## 2020-04-25 NOTE — Progress Notes (Signed)
Physical Therapy Session Note  Patient Details  Name: [name] MRN: [id number] Date of Birth: [birth year]  Today's Date: 04/25/2020 PT Individual Time: 5027-7412 and 8786-7672 PT Individual Time Calculation (min): 59 min  And 15 min  Short Term Goals: Week 1:  PT Short Term Goal 1 (Week 1): Pt will perform supine<>sit with mod assist PT Short Term Goal 2 (Week 1): Pt will perform bed<>chair transfer with +2 max assist (not using lift) PT Short Term Goal 3 (Week 1): Pt will perform sit<>stand using LRAD with +2 max assist (not using lift)  Skilled Therapeutic Interventions/Progress Updates:    Session 1: Pt received supine in bed and agreeable to therapy session. In supine, donned pants with max assist for threading LEs (unable to lift R LE to place in pants) and with manual facilitation for hooklying position attempted to lift hips but unable to clear to pull up pants. Supine>sitting L EOB with max multimodal cuing for logroll technique to increase pt independence and to allow therapist to pull up pants with max assist for trunk upright. R squat pivot to w/c with max assist of 1 for lifting/pivoting hips and +2 present for safety and to steady w/c - max cuing for anterior trunk lean, head-hips relationship, and to lift hips.  Transported to/from gym in w/c for time management and energy conservation. R squat pivot to EOM with max assist of 1 and +2 present for safety - continued above cuing. Sitting EOM pt continues to demonstrate pushing with L LE and L UE causing upward pelvic tilt on L and downward tilt on R. Sitting EOM, participated in trunk control, pelvic weight shifting, and midline orientation tasks to grasp clothespins on the L to facilitate L weight shift with CGA for steadying. Attempted progression to forward weight shift to come to partial squat position off mat but pt repeatedly stated "I can't" and demonstrated increased pushing due to fear of falling causing L hip pain.  Transitioned to supine with min assist for trunk descent and R LE management onto mat. Performed R LE NMR task of: bridging x15 reps with manual facilitation and max assist to lift hips with pt progressing to being able to clear hips from mat with mod assist and good R glute muscle activation; x12 reps R LE heel slides focusing on hip/knee flexion muscle activation. Returned to sitting EOM with max assist for trunk upright and max cuing for sequencing. Sitting EOM placed red wedge under R hip to lift that side and depress L side for improved pelvic alignment while participating in L lateral reaching task to grasp cards then R UE NMR and R attention task to place cards on matching pair. L squat pivot to w/c with max assist of 1 for lifting/pivoting hips and +2 for stabilizing w/c - pt demonstrates increased difficulty transferring this direction due to pushing with L hemibody. Transported back to room and left seated in w/c with needs in reach, R half tray donned, and seat belt alarm on.    Session 2: Pt received asleep, supine in bed but awakens to verbal stimulus and agreeable to short therapy session. Supine>sitting L EOB, HOB slight elevated but relying heavily on bedrails, with max cuing for logroll technique and R hemibody management and max assist for R LE management off EOB and trunk upright - cuing for using UE to push himself upright. Pt repots fatigue at this time but agreeable to continue with encouragement. R squat pivot transfer to w/c with max assist for  lifting/pivoting hips and continued cuing on head/hips relationship and anterior trunk weight shift - +2 present for safety and to stabilize w/c. Sitting in w/c pt continues to demonstrate significant pushing with L hip elevation and pt reporting some L hip pain. L squat pivot back to EOB with max assist for lifting/pivoting hips and pt demonstrating increased independence transferring this direction as he used bedrail to pull his hips around.  Sit>supine with mod assist for LE management into bed and cuing for UE support to manage trunk control. Pt left supine in bed with bed alarm on and Santiago Glad, RN present.   Therapy Documentation Precautions:  Precautions Precautions: Fall Precaution Comments: pusher, right hemiparesis Restrictions Weight Bearing Restrictions: No  Pain: Session 1: Continues to report L hip pain that worsens with increased pushing - modified positioning for pain management.   Session 2: Continues to report L hip pain - modified position for pain management as pt noted to have continued strong L LE pushing.   Therapy/Group: Individual Therapy  Tawana Scale, PT, DPT 04/25/2020, 8:01 AM

## 2020-04-25 NOTE — Progress Notes (Signed)
Occupational Therapy Session Note  Patient Details  Name: [name] MRN: [id number] Date of Birth: 03/09/1961  Today's Date: 04/25/2020 OT Individual Time: 3845-3646 OT Individual Time Calculation (min): 48 min  and Today's Date: 04/25/2020 OT Missed Time: 12 Minutes Missed Time Reason: Patient fatigue   Short Term Goals: Week 1:  OT Short Term Goal 1 (Week 1): Pt will maintain midline balance EOB with no more than min assist during UB selfcare tasks. OT Short Term Goal 2 (Week 1): Pt will complete LB bathing sit to stand with max assist of one person. OT Short Term Goal 3 (Week 1): Pt will use the RUE as a gross assist for holding items to be opened with no more than min facilitation and min instructional cueing. OT Short Term Goal 4 (Week 1): Pt will complete UB dressing with mod assist for two consecutive sessions. OT Short Term Goal 5 (Week 1): Pt will complete squat pivot transfer to the drop arm commode with mod assist.  Skilled Therapeutic Interventions/Progress Updates:    Treatment session with focus on self-care retraining with sit > stand, standing balance, RUE NMR, and trunk control during bathing and dressing at sink.  Pt received upright in w/c reporting fatigue but agreeable to bathing and dressing this session.  Therapist facilitated bathing with setup of items and encouragement of pt to incorporate RUE into bathing.  Provided hand over hand assist to ensure thoroughness when washing Lt arm, educated on various strategies to increase success with bathing.  Pt able to open deodorant with BUE, required hand over hand assist to apply to Lt underarm.  Pt able to don shirt with setup assist and increased time.  Engaged in LB bathing at sit > stand level with pt requiring max assist for sit > stand and mod assist to maintain upright standing posture.  Pt with increased pushing to Rt in standing requiring facilitation at hips and torso as well as use of mirror to facilitate  increased midline awareness and postural control.  Required +2 assistance for clothing management in standing due to pushing.  Placed wedge under Rt hip in sitting at sink with improved postural control during bathing/dressing, with noted decreased shortening along Lt lateral trunk.  Pt reports fatigue, requiring increased assistance for standing balance during LB dressing.  Returned to bed max assist +2 via slide board.  Max assist to position in to supine due to fatigue and decreased sequencing with bringing BLE in to bed.  Pt remained semi-reclined in bed with all needs in reach and RUE elevated for improved positioning and awareness.  Therapy Documentation Precautions:  Precautions Precautions: Fall Precaution Comments: pusher, right hemiparesis Restrictions Weight Bearing Restrictions: No General: General OT Amount of Missed Time: 12 Minutes Vital Signs: Therapy Vitals Temp: 98.1 F (36.7 C) Pulse Rate: 88 Resp: 20 BP: (!) 132/105 Patient Position (if appropriate): Sitting Oxygen Therapy SpO2: 100 % O2 Device: Room Air Pain: Pain Assessment Pain Scale: Faces Faces Pain Scale: Hurts a little bit Pain Type: Acute pain Pain Location: Leg Pain Orientation: Left Pain Onset: On-going Pain Intervention(s): Other (Comment);Repositioned(pt declined offer to inquire about pain medication) Multiple Pain Sites: No ADL: ADL Eating: Minimal assistance Where Assessed-Eating: Wheelchair Grooming: Minimal assistance Where Assessed-Grooming: Wheelchair Upper Body Bathing: Moderate assistance Where Assessed-Upper Body Bathing: Edge of bed Lower Body Bathing: Other (comment)(total +2) Where Assessed-Lower Body Bathing: Other (Comment), Edge of bed(total +2) Upper Body Dressing: Maximal assistance Where Assessed-Upper Body Dressing: Edge of bed Lower Body Dressing: Other (  Comment)(total +2) Toileting: Other (Comment)(total +2) Where Assessed-Toileting: Bedside Commode Toilet Transfer:  Other (comment)(total +2) Toilet Transfer Method: Stand pivot Toilet Transfer Equipment: Bedside commode   Therapy/Group: Individual Therapy  Simonne Come 04/25/2020, 4:02 PM

## 2020-04-25 NOTE — Consult Note (Signed)
Neuropsychological Consultation   Patient:   [name]   DOB:   08-09-1961  MR Number:  [id number]  Location:  [facility] [id number] [place] [address] Dept: [place]: [phone]           Date of Service:   04/25/2020  Start Time:   1 PM End Time:   2 PM  Provider/Observer:  Ilean Skill, Psy.D.       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  The patient has chronic combined systolic diastolic congestive heart failure, type 2 diabetes, hypertension, hyperlipidemia, history of polysubstance abuse including alcohol and tobacco abuse.  The patient is also been diagnosed with schizoaffective disorder bipolar type prior.  Prior to this most recent stroke the patient ambulated modified independent with rolling walker prior to admission.  The patient presented on 04/17/2020 with increasing right-sided weakness and dysarthria.  MRI identified small acute infarct of the left pons.  Moderate chronic ischemic microangiopathy noted.  Urine drug screen was negative as well as alcohol screen was negative.  Reason for Service:  The patient was referred for neuropsychological consultation due to residual effects of his now third stroke event.  Patient has a previous psychiatric diagnosis as well and we wanted to monitor any potential exacerbation of his symptoms.  Below is the HPI for the current admission.  HPI: [name] is a 59 year old right-handed male with history of pontine CVA 2013 residual right-sided weakness maintained on aspirin 81 mg and Plavix 75 mg, chronic combined systolic diastolic congestive heart failure, type 2 diabetes mellitus, hypertension,  hyperlipidemia, schizoaffective disorder as well as history of polysubstance alcohol and tobacco abuse.  Patient lives with children 1 level home 3 steps to entry.  Ambulated modified independent with a rolling walker prior to admission.  Family assistance as needed.  Presented 04/17/2020 with increasing right side weakness and dysarthria.  MRI identified small acute infarct of the left pons.  No hemorrhage or mass-effect.  Moderate chronic ischemic microangiopathy.  Patient did not receive TPA.  MRA without emergent large vessel occlusion.  CT angiogram head and neck advanced intracranial atherosclerosis with high-grade narrowing at the supraclinoid right ICA, left A1 origin, bilateral pericallosal arteries and bilateral PCA branches.  Echocardiogram with ejection fraction of 50%.  The left ventricle demonstrated global hypokinesis.  Admission chemistries BUN 23 creatinine 1.40 urine drug screen negative as well as alcohol negative.  Neurology follow-up maintained on aspirin 325 mg and Plavix for CVA prophylaxis x3 months then Plavix alone.  Subcutaneous Lovenox for DVT prophylaxis.  Currently on a dysphagia #2 thin liquid diet.  Therapy evaluations completed and patient was admitted for a comprehensive rehab program  Current Status:  The patient was sitting in his wheelchair upon entering the room.  He was oriented and aware of circumstances that it happened and was able to give apparent accurate historical information.  The patient had significant expressive language deficits related to motor deficits without apparent word finding deficits or receptive language deficits.  He was able to maintain attention and focus during our visit.  It was very difficult to understand much of what he was saying due to dysarthric speech.  The patient reports that he had maintained  significant motor changes and motor deficits following his previous strokes.  The patient reports that he has not been abusing substances recently at  all and that he has been trying to manage his blood pressure and other factors.  The patient reports it has become increasingly hard for him to cope with significant loss of function.  While he denied severe depression and reports that his mood state has been fairly stable although he continues to be quite frustrated with loss of function and not sure how hard he is going to be able to continue to work with his significant loss of function.  The patient continues to take his Prozac and Remeron at night for sleep and also takes Neurontin which may also help with mood stabilization.  Behavioral Observation: [name]  presents as a 59 y.o.-year-old Right African American Male who appeared his stated age. his dress was Appropriate and he was Well Groomed and his manners were Appropriate to the situation.  his participation was indicative of Appropriate and Redirectable behaviors.  There were any physical disabilities noted.  he displayed an appropriate level of cooperation and motivation.     Interactions:    Active Appropriate and Redirectable  Attention:   abnormal and attention span appeared shorter than expected for age  Memory:   within normal limits; recent and remote memory intact  Visuo-spatial:  not examined  Speech (Volume):  low  Speech:   garbled; slurred  Thought Process:  Coherent and Relevant  Though Content:  WNL; not suicidal and not homicidal  Orientation:   person, place, time/date and situation  Judgment:   Fair  Planning:   Fair  Affect:    Appropriate  Mood:    Dysphoric  Insight:   Fair  Intelligence:   normal  Substance Use:  There is a documented history of alcohol and other abuse confirmed by the patient.    Medical History:   Past Medical History:  Diagnosis Date  . Alcohol abuse   . Arthritis   . Crack cocaine use   . Depression   . Diabetes mellitus   . Drug abuse ([place])   . DTs (delirium tremens) ([place])    history of   . Gout   .  Noncompliance with medication regimen   . Schizophrenia ([place])   . Stroke ([place])   . Systolic heart failure secondary to hypertension ([place])   . Uncontrolled hypertension     Psychiatric History:  Patient has a prior psychiatric history of being diagnosed with schizoaffective disorder bipolar type.  He has a history of depression and schizophrenia as far as prior medical diagnoses.  The patient has had substance abuse issues with both alcohol and cocaine use.  Family Med/Psych History:  Family History  Problem Relation Age of Onset  . 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  The patient has chronic combined systolic diastolic congestive heart failure, type 2 diabetes, hypertension, hyperlipidemia, history of polysubstance abuse including alcohol and tobacco abuse.  The patient is also been diagnosed with schizoaffective disorder bipolar type prior.  Prior to this most recent stroke the patient ambulated modified independent with rolling walker prior to admission.  The patient presented on 04/17/2020 with increasing right-sided weakness and dysarthria.  MRI identified small acute infarct of the left pons.  Moderate chronic ischemic microangiopathy noted.  Urine  drug screen was negative as well as alcohol screen was negative.  The patient was sitting in his wheelchair upon entering the room.  He was oriented and aware of circumstances that it happened and was able to give apparent accurate historical information.  The patient had significant expressive language deficits related to motor deficits without apparent word finding deficits or receptive language deficits.  He was able to maintain attention and focus during our visit.  It was very difficult to understand much of what he was saying due to dysarthric speech.  The patient reports  that he had maintained significant motor changes and motor deficits following his previous strokes.  The patient reports that he has not been abusing substances recently at all and that he has been trying to manage his blood pressure and other factors.  The patient reports it has become increasingly hard for him to cope with significant loss of function.  While he denied severe depression and reports that his mood state has been fairly stable although he continues to be quite frustrated with loss of function and not sure how hard he is going to be able to continue to work with his significant loss of function.  The patient continues to take his Prozac and Remeron at night for sleep and also takes Neurontin which may also help with mood stabilization.  Diagnosis:    Left pontine cerebrovascular accident [facility]) - Plan: Ambulatory referral to Neurology, Ambulatory referral to Neurology  Hip pain - Plan: DG Lumbar Spine 2-3 Views, DG Lumbar Spine 2-3 Views         Electronically Signed   _______________________ Ilean Skill, Psy.D.

## 2020-04-25 NOTE — Progress Notes (Signed)
Speech Language Pathology Daily Session Note  Patient Details  Name: [name] MRN: [id number] Date of Birth: 1961-02-13  Today's Date: 04/25/2020 SLP Individual Time: 1000-1100 SLP Individual Time Calculation (min): 60 min  Short Term Goals: Week 1: SLP Short Term Goal 1 (Week 1): Pt will use small bites and sips to mitigate difficulty with oral manipulation and clearance as well as overt s/s of aspiration with dys 1 textures and thin liquids. SLP Short Term Goal 2 (Week 1): Pt will consume therapeutic trials of dys 2 textures with min cues to clear solids from the oral cavity in a timely manner over 3 consecutive sessions prior to advancement. SLP Short Term Goal 3 (Week 1): Pt will utilize slow rate, overarticulation, and increased vocal intensity to achieve intelligibility at the word level with mod assist verbal cues. SLP Short Term Goal 4 (Week 1): Pt will recall daily information with min cues for use of external aids. SLP Short Term Goal 5 (Week 1): Pt will complete basic, familiar tasks with min cues for functional problem solving.  Skilled Therapeutic Interventions: Pt was seen for skilled ST targeting cognition and swallowing. SLP facilitated session with basic money management activity from the [place]. Pt displayed set amounts of change with Min A for problem solving and error awareness, however increased Mod A verbal and visual cues required for said skills when calculating totals and making change (addition and subtraction). Mod A verbal cues were required to reduce impulsivity and sustain attention to both structured and functional tasks during session.   SLP further facilitated session with upgraded trial of dys 2 (minced/ground) solids and nectar thick liquids. One immediate subtle throat clear noted after sip of nectar. [name] mastication of dys 2 solids was slightly prolonged and he required Min A cues for clearance of trace lingual residue, however he was able to achieve  full oral clearance with subsequent dry swallow and liquid wash. Min A verbal cues were required for safe bite and sip size during intake when self-feeding. Recommend continue current diet and ST will continue to provide opportunities for advancement as appropriate. Of note, pt with strong baseline cough upon entering room (in absence of PO), although no excessive secretions observed. Pt left sitting in bed with alarm set and needs within reach. Continue per current plan of care.         Pain Pain Assessment Pain Scale: Faces Faces Pain Scale: Hurts a little bit Pain Type: Acute pain Pain Location: Leg Pain Orientation: Left Pain Descriptors / Indicators: Aching;Discomfort;Dull Pain Frequency: Constant Pain Onset: On-going Pain Intervention(s): Other (Comment);Repositioned(pt declined offer to inquire about pain medication) Multiple Pain Sites: No  Therapy/Group: Individual Therapy  Arbutus Leas 04/25/2020, 7:19 AM

## 2020-04-26 ENCOUNTER — Inpatient Hospital Stay (HOSPITAL_COMMUNITY): Payer: Medicaid Other | Admitting: Occupational Therapy

## 2020-04-26 ENCOUNTER — Inpatient Hospital Stay (HOSPITAL_COMMUNITY): Payer: Medicaid Other | Admitting: Speech Pathology

## 2020-04-26 ENCOUNTER — Inpatient Hospital Stay (HOSPITAL_COMMUNITY): Payer: Medicaid Other | Admitting: Physical Therapy

## 2020-04-26 LAB — GLUCOSE, CAPILLARY
Glucose-Capillary: 154 mg/dL — ABNORMAL HIGH (ref 70–99)
Glucose-Capillary: 171 mg/dL — ABNORMAL HIGH (ref 70–99)
Glucose-Capillary: 145 mg/dL — ABNORMAL HIGH (ref 70–99)
Glucose-Capillary: 145 mg/dL — ABNORMAL HIGH (ref 70–99)

## 2020-04-26 MED ORDER — MODAFINIL 100 MG PO TABS
100.0000 mg | ORAL_TABLET | Freq: Every day | ORAL | Status: DC
  Administered 2020-04-26 – 2020-05-08 (×13): 100 mg via ORAL
  Filled 2020-04-26 (×14): qty 1

## 2020-04-26 MED ORDER — RESOURCE THICKENUP CLEAR PO POWD
ORAL | Status: DC | PRN
  Filled 2020-04-26 (×4): qty 125

## 2020-04-26 NOTE — Patient Care Conference (Cosign Needed)
Inpatient RehabilitationTeam Conference and Plan of Care Update Date: 04/26/2020   Time: 2:36 PM    Patient Name: [name]      Medical Record Number: [id number]  Date of Birth: [birth year] Sex: Male         Room/Bed: 4W08C/4W08C-01 Payor Info: Payor: MEDICAID [place] / Plan: MEDICAID  ACCESS / Product Type: *No Product type* /    Admit Date/Time:  04/20/2020  3:04 PM  Primary Diagnosis:  Left pontine cerebrovascular accident [facility])  Patient Active Problem List   Diagnosis Date Noted  . Chronic diastolic congestive heart failure ([place])   . Dysphagia, post-stroke   . 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[place]) 04/20/2020  . CVA (cerebral vascular accident) ([place]) 04/18/2020  . Right sided weakness 04/17/2020  . History of CVA with residual deficit 04/17/2020  . Chronic combined systolic (congestive) and diastolic (congestive) heart failure ([place]) 04/17/2020  . Substance use disorder 04/17/2020  . Abnormal EKG 02/07/2020  . Left hip pain 02/07/2020  . AKI (acute kidney injury) ([place]) 02/07/2020  . Systolic heart failure secondary to hypertension ([place])   . Schizophrenia ([place])   . Acute systolic heart failure ([place]) 02/02/2018  . Chest pain 01/31/2018  . Hypokalemia   . Schizoaffective disorder, bipolar type ([place]) 11/22/2017  . Trauma   . Tachycardia 03/03/2017  . Alcohol abuse   . Cocaine abuse ([place])   . Hypertensive urgency   . Tachycardia   . Cocaine abuse with cocaine-induced mood disorder ([place]) 12/05/2016  . Cerebrovascular disease 09/02/2015  . 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 Malignant hypertension 08/30/2015  . Alcohol use disorder, severe, dependence ([place]) 08/30/2015  . Alcohol withdrawal ([place]) 08/30/2015  . Alcohol abuse with alcohol-induced mood disorder ([place]) 08/30/2015  . Thrombocytopenia ([place]) 08/27/2015  . Tobacco use disorder 08/26/2015  . Hyperlipidemia associated with type 2 diabetes mellitus  ([place]) 04/28/2015  . Schizoaffective disorder, depressive type ([place])   . Hypertension associated with diabetes ([place]) 08/09/2013  . Gout 08/09/2013  . Diabetes mellitus ([place]) 07/09/2012    Expected Discharge Date: Expected Discharge Date: 05/16/20  Team Members Present: Physician leading conference: Dr. Alysia Penna Care Coodinator Present: Nestor Lewandowsky, RN, BSN, CRRN;Christina Sampson Goon, [place] Nurse Present: Isla Pence, RN PT Present: Page Spiro, PT OT Present: Clyda Greener, OT SLP Present: Jettie Booze, CF-SLP PPS Coordinator present : Ileana Ladd, PT     Current Status/Progress Goal Weekly Team Focus  Bowel/Bladder   [name] having intermittent episodes of incontinence with intermittent need of straight cath. LBM 04/24/2020  Pt will be continent and will not need bladder intervention.  Assess bowel and bladder every shift and toilet pt every 4 hours.   Swallow/Nutrition/ Hydration   Dys 1, nectar (downgraded due to explosive coughing with thin), water protocol, Min A  Mod I  tolerance current diet, trials Dys 2, water protocol, independence with swallow strategies   ADL's   Max assist +2 slide board or squat pivot transfers, Mod assist bathing, +2 for LB dressing due to increased pushing in standing, Supervision for UB dressing.  Min A overall, Mod A LB dressing  ADL retraining, trunk control/midline orientation in sitting and standing, RUE NMR, transfers, sit > stand, standing balance   Mobility   max assist supine<>sit, +2 max assist for squat pivot transfers or slide board transfers pending pt's fatigue level, +2 max assist for sit<>stands, unable to progress to ambulation  min assist overall  bed mobility, transfers, activity tolerance, R LE NMR, midline orientation/decreased pushing,  trunk control, pt education   Communication   Mod A use of strategies for intelligibility, 75% sentence level  Min A  carryover speech strategies   Safety/Cognition/ Behavioral  Observations  Min-Mod  Supervision  sustained attention, recall, basic problem solving, emergent awareness   Pain   [name] pain managed with voltaren gel, muscle cream, and PRN tylenol  [name] pain level with remain less than 3  Assess pt's pain every shift and PRN   Skin   Pts skin intact  [name] skin will remain intact.  Encourage frequent repositioning    Rehab Goals Patient on target to meet rehab goals: Yes Rehab Goals Revised: Patient on target with current goals *See Care Plan and progress notes for long and short-term goals.     Barriers to Discharge  Current Status/Progress Possible Resolutions Date Resolved   Nursing                  PT  Decreased caregiver support;Home environment access/layout;Incontinence;Medical stability;Lack of/limited family support;Neurogenic Bowel & Bladder                 OT                  SLP                Care Coordinator                Discharge Planning/Teaching Needs:  Patient plans to discharge home with daughter to provide care  Will schedule if needed   Team Discussion:  Progress impaired by fatigue, pushing to the right, poor trunk control and neuropathic pain in left extremities. Incontinent at night and requires I+O cath once a shift.   Revisions to Treatment Plan:  Continue with H2O protocol; RMT exercises recommended for breath support issues. Liquid consistency changed back to nectar thick due to coughing and SLP down-graded swallowing goals to supervision overall    Medical Summary Current Status: Blood pressure intermittently elevated, left lower extremity pain, dysarthria with poor breath support Weekly Focus/Goal: Manage blood pressures with medication, address lower extremity pain.  Barriers to Discharge: Other (comments);Medical stability  Barriers to Discharge Comments: Pain control, elevated blood pressure Possible Resolutions to Barriers: Medication management, possible need for further imaging   Continued Need for  Acute Rehabilitation Level of Care: The patient requires daily medical management by a physician with specialized training in physical medicine and rehabilitation for the following reasons: Direction of a multidisciplinary physical rehabilitation program to maximize functional independence : Yes Medical management of patient stability for increased activity during participation in an intensive rehabilitation regime.: Yes Analysis of laboratory values and/or radiology reports with any subsequent need for medication adjustment and/or medical intervention. : Yes   I attest that I was present, lead the team conference, and concur with the assessment and plan of the team.   Dorien Chihuahua B 04/26/2020, 2:36 PM

## 2020-04-26 NOTE — Progress Notes (Signed)
Physical Therapy Session Note  Patient Details  Name: [name] MRN: [id number] Date of Birth: 11/24/1960  Today's Date: 04/26/2020 PT Individual Time: 1315-1400 PT Individual Time Calculation (min): 45 min   Short Term Goals: Week 1:  PT Short Term Goal 1 (Week 1): Pt will perform supine<>sit with mod assist PT Short Term Goal 2 (Week 1): Pt will perform bed<>chair transfer with +2 max assist (not using lift) PT Short Term Goal 3 (Week 1): Pt will perform sit<>stand using LRAD with +2 max assist (not using lift)  Skilled Therapeutic Interventions/Progress Updates:    Patient received in bed, pleasant and willing to participate in session and pre-medicated by nursing. Required ModA for functional bed mobility, then able to perform multiple sit to stands in stedy with extended time and BUE support/CGA today and only mild R lean!!! Standing tolerance limited due to L hip pain but able to maintain for 1-2 minutes in stedy with S. Able to perform squat pivot transfers with ModA both ways and Mod cues for sequencing, worked on sitting balance, core strength, and reaching from edge of mat table but also limited by L hip pain here as well. Returned to bed via stedy and min guard for sit to stand/ModA for return to supine and left in bed with all needs met this afternoon, bed alarm active.   Therapy Documentation Precautions:  Precautions Precautions: Fall Precaution Comments: pusher, right hemiparesis Restrictions Weight Bearing Restrictions: No General: PT Amount of Missed Time (min): 15 Minutes PT Missed Treatment Reason: Other (Comment)(finishing lunch) Pain: Pain Assessment Pain Scale: Faces Pain Score: 0-No pain Faces Pain Scale: Hurts whole lot Pain Type: Acute pain Pain Location: Leg Pain Orientation: Left Pain Descriptors / Indicators: Discomfort;Sore Pain Onset: On-going Patients Stated Pain Goal: 0 Pain Intervention(s): Repositioned;Distraction;Ambulation/increased  activity;RN made aware;Medication (See eMAR) Multiple Pain Sites: No    Therapy/Group: Individual Therapy   [name], DPT, PN1   Supplemental Physical Therapist [facility] [place]    Pager [phone] Acute Rehab Office [phone]    04/26/2020, 3:46 PM

## 2020-04-26 NOTE — Progress Notes (Signed)
Speech Language Pathology Daily Session Note  Patient Details  Name: [name] MRN: [id number] Date of Birth: Oct 15, 1961  Today's Date: 04/26/2020 SLP Individual Time: 0932-1028 SLP Individual Time Calculation (min): 56 min  Short Term Goals: Week 1: SLP Short Term Goal 1 (Week 1): Pt will use small bites and sips to mitigate difficulty with oral manipulation and clearance as well as overt s/s of aspiration with dys 1 textures and thin liquids. SLP Short Term Goal 2 (Week 1): Pt will consume therapeutic trials of dys 2 textures with min cues to clear solids from the oral cavity in a timely manner over 3 consecutive sessions prior to advancement. SLP Short Term Goal 3 (Week 1): Pt will utilize slow rate, overarticulation, and increased vocal intensity to achieve intelligibility at the word level with mod assist verbal cues. SLP Short Term Goal 4 (Week 1): Pt will recall daily information with min cues for use of external aids. SLP Short Term Goal 5 (Week 1): Pt will complete basic, familiar tasks with min cues for functional problem solving.  Skilled Therapeutic Interventions: Pt was seen for skilled ST targeting dysphagia and speech goals. SLP provided upgraded trial of Dysphagia 2 (minced/ground) solids along with nectar thick juice. Pt was somewhat distracted by direct observation, therefore SLP moved out of direct line of sight but continued to provide skilled observation during consumption in addition to Min A verbal cues for safer bite size and oral clearance. Mastication was more prolonged and largely inefficient today in comparison to previous trials. Moderate cueing required for sustained attention to intake. Pt exhibited an immediate cough X1 and throat clear X1 during trials of Dys 2, suspect due to prolonged mastication and inattention to bolus. Recommend continue current diet and attempt additional trials Dys 2 prior to advancement, primarily due to decreased safety with  inattention to intake and boluses. No overt s/sx aspiration observed with intake of nectar thick juice.  SLP further facilitated session with word and phrase level tasks targeting speech intelligibility. Min A visual cues required for pt to locate aid to recall speech strategies. Pt was 80% intelligible with 2-syllable words and 70% intelligible with production of 3-syllable words with only Min A cues required for implementation of overarticulation and slow rate. During phrase level picture description tasks, cueing for use of strategies, with emphasis on overarticulation, need for cueing increased to Mod A. Pt left sitting in wheelchair with alarm set and needs within reach. Continue per current plan of care.      Pain Pain Assessment Pain Scale: 0-10 Pain Score: 0-No pain  Therapy/Group: Individual Therapy  Arbutus Leas 04/26/2020, 6:57 AM

## 2020-04-26 NOTE — Progress Notes (Signed)
[place] PHYSICAL MEDICINE & REHABILITATION PROGRESS NOTE   Subjective/Complaints: Per RN has more LLE pain when sitting  Degenerative changes L5-S1>L4-5  Pt also states he runs out of breath with talking since CVA  ROS: Appears to deny CP, SOB, N/V/D  Objective:   DG Lumbar Spine 2-3 Views  Result Date: 04/25/2020 CLINICAL DATA:  Low back pain with lower extremity radicular symptoms EXAM: LUMBAR SPINE - 2-3 VIEW COMPARISON:  None. FINDINGS: Frontal, lateral, and spot lumbosacral lateral images were obtained. There are 5 non-rib-bearing lumbar type vertebral bodies. No fracture or spondylolisthesis. There is moderate disc space narrowing at L5-S1 with milder disc space narrowing at L4-5. Other disc spaces appear unremarkable. No erosive change. IMPRESSION: Osteoarthritic change at L5-S1 and to a lesser degree at L4-5. Other disc spaces appear unremarkable. No fracture or spondylolisthesis. Electronically Signed   By: Lowella Grip III M.D.   On: 04/25/2020 11:43   No results for input(s): WBC, HGB, HCT, PLT in the last 72 hours. No results for input(s): NA, K, CL, CO2, GLUCOSE, BUN, CREATININE, CALCIUM in the last 72 hours.  Intake/Output Summary (Last 24 hours) at 04/26/2020 0757 Last data filed at 04/25/2020 2356 Gross per 24 hour  Intake 480 ml  Output 550 ml  Net -70 ml     Physical Exam: Vital Signs Blood pressure (!) 167/99, pulse 72, temperature 97.8 F (36.6 C), resp. rate 14, height '5\' 11"'  (1.803 m), weight 100.6 kg, SpO2 99 %.   General: No acute distress Mood and affect are appropriate Heart: Regular rate and rhythm no rubs murmurs or extra sounds Lungs: Clear to auscultation, breathing unlabored, no rales or wheezes Abdomen: Positive bowel sounds, soft nontender to palpation, nondistended Extremities: No clubbing, cyanosis, or edema, No calf swelling or tenderness LLE  Skin: No evidence of breakdown, no evidence of rash  equivocal SLR LLE pain in back of  knee Motor: LUE/LLE: 5/5 proximal distal  RUE: 3/5 proximal to distal, unchanged, RLE: HF, KE 3-/4 ADF 0/5, unchanged Dysarthria, unchanged Right facial weakness  Assessment/Plan: 1. Functional deficits secondary to Left pontine infarct which require 3+ hours per day of interdisciplinary therapy in a comprehensive inpatient rehab setting.  Physiatrist is providing close team supervision and 24 hour management of active medical problems listed below.  Physiatrist and rehab team continue to assess barriers to discharge/monitor patient progress toward functional and medical goals  Care Tool:  Bathing    Body parts bathed by patient: Right arm, Chest, Abdomen, Right upper leg, Left upper leg, Face, Front perineal area   Body parts bathed by helper: Left arm, Buttocks     Bathing assist Assist Level: Moderate Assistance - Patient 50 - 74%     Upper Body Dressing/Undressing Upper body dressing   What is the patient wearing?: Pull over shirt    Upper body assist Assist Level: Supervision/Verbal cueing    Lower Body Dressing/Undressing Lower body dressing      What is the patient wearing?: Incontinence brief, Pants     Lower body assist Assist for lower body dressing: 2 Helpers     Toileting Toileting    Toileting assist Assist for toileting: Maximal Assistance - Patient 25 - 49%     Transfers Chair/bed transfer  Transfers assist     Chair/bed transfer assist level: 2 Helpers     Locomotion Ambulation   Ambulation assist   Ambulation activity did not occur: Safety/medical concerns          Walk 10  feet activity   Assist  Walk 10 feet activity did not occur: Safety/medical concerns        Walk 50 feet activity   Assist Walk 50 feet with 2 turns activity did not occur: Safety/medical concerns         Walk 150 feet activity   Assist Walk 150 feet activity did not occur: Safety/medical concerns         Walk 10 feet on uneven surface   activity   Assist Walk 10 feet on uneven surfaces activity did not occur: Safety/medical concerns         Wheelchair     Assist Will patient use wheelchair at discharge?: No(TBD)             Wheelchair 50 feet with 2 turns activity    Assist            Wheelchair 150 feet activity     Assist          Blood pressure (!) 167/99, pulse 72, temperature 97.8 F (36.6 C), resp. rate 14, height '5\' 11"'  (1.803 m), weight 100.6 kg, SpO2 99 %.  Medical Problem List and Plan: 1.  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  Recommendations a 30-day cardiac event monitor as outpatient  Continue CIR Team conference today please see physician documentation under team conference tab, met with team  to discuss problems,progress, and goals. Formulized individual treatment plan based on medical history, underlying problem and comorbidities.  PRAFO ordered 2.  Antithrombotics: -DVT/anticoagulation: Lovenox             -antiplatelet therapy: Aspirin 325 mg and Plavix 75 mg daily x3 months then Plavix alone 3. Pain Management: Voltaren gel 4 times daily, Neurontin 300 mg 3 times daily  Controlled on 6/7 LLE pain chronic with normal L hip MRI 20-19, ? CVA related vs sciatic, increase gabapentin,  lumbar films show disc height loss L5-S1, no spondylolisthesis 4. Mood: Cymbalta 30 mg twice daily             -antipsychotic agents: Invega 3 mg daily 5. Neuropsych: This patient is capable of making decisions on his own behalf. 6. Skin/Wound Care: Routine skin checks 7. Fluids/Electrolytes/Nutrition: Routine in and outs with follow-up chemistries 8.  Diastolic congestive heart failure.  Monitor for any signs of fluid overload Filed Weights   04/24/20 0352 04/25/20 0516 04/26/20 0515  Weight: 98.8 kg 97.7 kg 100.6 kg    Up on 6/9 9.  Diabetes mellitus with peripheral neuropathy.  Hemoglobin A1c 7.7.  SSI.    Glucophage 500 mg twice daily prior to admission, resumed on 6/4 CBG (last 3)  Recent Labs    04/25/20 1639 04/25/20 2129 04/26/20 0618  GLUCAP 129* 146* 154*   Fair control 10.  Hypertension.  Patient on Coreg 25 mg twice daily, Norvasc 10 mg daily, hydralazine 25 mg 3 times daily, Cozaar 50 mg daily prior to admission.  Resume as needed Vitals:   04/25/20 2151 04/26/20 0520  BP: (!) 132/91 (!) 167/99  Pulse: 75 72  Resp:  14  Temp:  97.8 F (36.6 C)  SpO2: 98% 99%   Amlodipine 5 started on 6/4, increase to 10 on 6/7  Cont to monitor on current dose  11.  Hyperlipidemia.  Lipitor 12.  History of polysubstance tobacco and alcohol abuse.  Urine drug screen  negative.  Provide counseling 13.  Post stroke dysphagia  D1 thins, advance diet as tolerated  LOS: 6 days A FACE TO FACE EVALUATION WAS PERFORMED  Charlett Blake 04/26/2020, 7:57 AM

## 2020-04-26 NOTE — Progress Notes (Addendum)
Occupational Therapy Session Note  Patient Details  Name: [name] MRN: [id number] Date of Birth: Jul 21, 1961  Today's Date: 04/26/2020 OT Individual Time: 4742-5956 OT Individual Time Calculation (min): 58 min    Short Term Goals: Week 1:  OT Short Term Goal 1 (Week 1): Pt will maintain midline balance EOB with no more than min assist during UB selfcare tasks. OT Short Term Goal 2 (Week 1): Pt will complete LB bathing sit to stand with max assist of one person. OT Short Term Goal 3 (Week 1): Pt will use the RUE as a gross assist for holding items to be opened with no more than min facilitation and min instructional cueing. OT Short Term Goal 4 (Week 1): Pt will complete UB dressing with mod assist for two consecutive sessions. OT Short Term Goal 5 (Week 1): Pt will complete squat pivot transfer to the drop arm commode with mod assist.  Skilled Therapeutic Interventions/Progress Updates:    Session 1 (3875-6433)  Pt completed transfer from supine to sit EOB with mod assist and max instructional cueing for technique.  He was then able to complete squat pivot transfer with mod assist to the wheelchair in order to transition over to the sink for bathing and dressing.  Min assist for UB bathing and dressing with max instructional cueing to try and maintain upright midline posture.  He demonstrated posterior LOB at times as well as slight right lean secondary to pusher tendencies, but overall only needed min assist for balance.  He needed max assist for LB selfcare sit to stand secondary to increased pushing to the right side as well as.  He continues to report increased hip pain in the left hip with standing, which limits his standing tolerance to less than one minute.  He needed mod assist for donning his gripper socks using BUEs.  Finished session with pt sitting up in the wheelchair getting ready for breakfast.  Call button and phone in reach with safety belt in place.    Session 2:  (2951-8841) Pt in bed to start session, had him transfer to the EOB with min assist using the left rail for support.  Once sitting, had him focus on functional reach and coordination with the RUE by picking up small foam pieces and placing them in a cup, one at a time.  Increased difficulty noted, with pt dropping one occasionally when trying to place it in the cup, but he was 95% successful with his attempts.  He was able to maintain static sitting balance during task with supervision and only slight weightshift to the right.  Finished session with transfer back to supine with min assist and heat pack applied to his left hip secondary to increased pain at 4/10 on the faces scale.  Call button and phone in reach with bed alarm in place.  Encouraged pt to continue working on functional reach and coordination with use of the foam pieces and the RUE, while sitting upright in the bed later.    Therapy Documentation Precautions:  Precautions Precautions: Fall Precaution Comments: pusher, right hemiparesis Restrictions Weight Bearing Restrictions: No  Pain: Pain Assessment Pain Scale: 0-10 Pain Score: 0-No pain ADL: See Care Tool Section for some details of mobility and selfcare  Therapy/Group: Individual Therapy  Bradly Sangiovanni OTR/L 04/26/2020, 12:27 PM

## 2020-04-26 NOTE — Progress Notes (Signed)
Patient ID: [name], male   DOB: [birth year], 59 y.o.   MRN: [id number]  Team Conference Report to Patient/Family  Team Conference discussion was reviewed with the patient and caregiver, including goals, any changes in plan of care and target discharge date.  Patient and caregiver express understanding and are in agreement.  The patient has a target discharge date of 05/16/20.  Dyanne Iha 04/26/2020, 1:45 PM

## 2020-04-27 ENCOUNTER — Inpatient Hospital Stay (HOSPITAL_COMMUNITY): Payer: Medicaid Other | Admitting: Speech Pathology

## 2020-04-27 ENCOUNTER — Inpatient Hospital Stay (HOSPITAL_COMMUNITY): Payer: Medicaid Other | Admitting: Occupational Therapy

## 2020-04-27 ENCOUNTER — Inpatient Hospital Stay (HOSPITAL_COMMUNITY): Payer: Medicaid Other | Admitting: Physical Therapy

## 2020-04-27 LAB — GLUCOSE, CAPILLARY
Glucose-Capillary: 148 mg/dL — ABNORMAL HIGH (ref 70–99)
Glucose-Capillary: 152 mg/dL — ABNORMAL HIGH (ref 70–99)
Glucose-Capillary: 142 mg/dL — ABNORMAL HIGH (ref 70–99)
Glucose-Capillary: 127 mg/dL — ABNORMAL HIGH (ref 70–99)

## 2020-04-27 MED ORDER — LOSARTAN POTASSIUM 50 MG PO TABS
25.0000 mg | ORAL_TABLET | Freq: Every day | ORAL | Status: DC
  Administered 2020-04-27: 25 mg via ORAL
  Filled 2020-04-27 (×2): qty 1

## 2020-04-27 NOTE — Progress Notes (Signed)
Speech Language Pathology Daily Session Note  Patient Details  Name: [name] MRN: [id number] Date of Birth: [birth year]  Today's Date: 04/27/2020 SLP Individual Time: 0931-1000 SLP Individual Time Calculation (min): 29 min  Short Term Goals: Week 1: SLP Short Term Goal 1 (Week 1): Pt will use small bites and sips to mitigate difficulty with oral manipulation and clearance as well as overt s/s of aspiration with dys 1 textures and thin liquids. SLP Short Term Goal 2 (Week 1): Pt will consume therapeutic trials of dys 2 textures with min cues to clear solids from the oral cavity in a timely manner over 3 consecutive sessions prior to advancement. SLP Short Term Goal 3 (Week 1): Pt will utilize slow rate, overarticulation, and increased vocal intensity to achieve intelligibility at the word level with mod assist verbal cues. SLP Short Term Goal 4 (Week 1): Pt will recall daily information with min cues for use of external aids. SLP Short Term Goal 5 (Week 1): Pt will complete basic, familiar tasks with min cues for functional problem solving.  Skilled Therapeutic Interventions: Pt was seen for skilled ST targeting speech interventions. SLP introduced Philips low level EMST device to work toward increasing breath support for speech intelligibility as well as pharyngeal swallow function. Device initially set to 15 cm H2O resistance, however pt could only perform 8 repetitions of exercises with accuracy and self rating of difficulty was 10/10. With device adjusted only slightly, to 14 cm H2O pt performed 3 sets of 10 (30 total) exercises when provided Min A verbal cues for accuracy in use of device and self rating was 6/10. Recommended pt complete 3 sets of 10 EMST exercises with device set at 14 cm H2O resistance at least 1X daily outside of therapy. Pt required Moderate verbal cues for implementation of slower rate to increase intelligibility at the phrase/sentence level. [name] speech was ~60%  intelligible at phrase/sentence level. Pt left sitting in chair with alarm set and needs within reach. Continue per current plan of care.        Pain Pain Assessment Pain Scale: 0-10 Pain Score: 0-No pain   Therapy/Group: Individual Therapy  Arbutus Leas 04/27/2020, 7:04 AM

## 2020-04-27 NOTE — Progress Notes (Signed)
Physical Therapy Session Note  Patient Details  Name: [name] MRN: [id number] Date of Birth: [birth year]  Today's Date: 04/27/2020 PT Individual Time: 1009-1105 and 0626-9485 PT Individual Time Calculation (min): 56 min and 29 min   Short Term Goals: Week 1:  PT Short Term Goal 1 (Week 1): Pt will perform supine<>sit with mod assist PT Short Term Goal 2 (Week 1): Pt will perform bed<>chair transfer with +2 max assist (not using lift) PT Short Term Goal 3 (Week 1): Pt will perform sit<>stand using LRAD with +2 max assist (not using lift)  Skilled Therapeutic Interventions/Progress Updates:    Session 1: Pt received sitting in w/c and agreeable to therapy session.  Transported to/from gym in w/c for time management and energy conservation. Block practice transfers: R/L squat pivot transfer w/c<>EOM with mod assist for lifting/pivoting hips and cuing/facilitation for R LE WBing. Sitting EOM placed red wedge under R hip to improve pelvic alignment due to slight L hip hiking/elevation (though improved from Tuesday). Sitting in this position participated in trunk control activity of cross body reaching to punch target using boxing gloves - pt has R anterior trunk LOB when reaching that direction but able to recover without assist but cuing for attention to the LOB. Sit<>stands to/from EOM with +2 mod assist via [employer] with manual blocking of L LE pushing and manual facilitation for R hip/knee extension - mirror feedback and cuing for L weight shift and R LE extension. Progressed to pre-gait training focusing on L weight shift to decrease pushing while stepping R LE forward/backwards with max manual facilitation for stepping. Pt reporting fatigue therefore transitioned back to sitting trunk control/midline orientation task of L lateral trunk lean onto forearm support with cross body reaching to grasp horseshoe with R UE then using mirror feedback and pole of basketball goal for midline  orientation while placing horseshoe over basketball goal with R arm - requires min assist for R UE movement and close supervision for sitting balance. L squat pivot transfer back to w/c with mod assist for lifting/pivoting hips - focused on transfers in this direction targeting decreased pushing and increased R LE muscle activation. Transported back to room and left seated in w/c with needs in reach and seat belt alarm on.  Discussed with patient and [name], Rec Therapist, regarding his hobby of playing guitar and plan to utilize this for R UE NMR in future sessions.  Session 2: Pt received supine in bed and agreeable to therapy session though repeatedly stating "I'm so tired." Supine>sitting L EOB with pt demonstrating increased activation of R UE and LE in this task but still requiring max assist for trunk upright. Pt required mod assist for trunk upright due to L posterior lean/LOB and max/total assist to scoot hips closer to EOB and use L UE support on bedrail to maintain static sitting balance. Sit>stand elevated EOB>stedy with mod assist for lifting - demonstrates moderate pushing with this and reports L hip pain. Stedy transfer in front of mirror working on visual feedback of midline orientation while participating in L reaching task to grasp objects and incorporating speech by having pt verbalize name of each object - close supervision for safety and R UE support on stedy bar. Pt reports increasing L LE plantarflexor pain in this position as he demonstrates constant pushing despite cuing. Stedy transfer back to EOB. Therapist educates pt on importance of using visual feedback for midline awareness to correct the pushing. Sit>supine with min assist for R LE management into  the bed. Pt left supine in bed with needs in reach and bed alarm on.  Therapy Documentation Precautions:  Precautions Precautions: Fall Precaution Comments: pusher, right hemiparesis Restrictions Weight Bearing Restrictions:  No  Pain: Session 1: Reports L hip pain that increases with prolonged standing due to increased pushing - provided seated rest breaks and repositioning for pain management.  Session 2: Reports L plantarflexor pain during prolonged high perched sitting in stedy due to pushing - repositioned for pain management with pt reporting relief.    Therapy/Group: Individual Therapy  Tawana Scale, PT, DPT  04/27/2020, 7:54 AM

## 2020-04-27 NOTE — Progress Notes (Signed)
[place] PHYSICAL MEDICINE & REHABILITATION PROGRESS NOTE   Subjective/Complaints:  No issues overnite , LLE pain improved   ROS: Appears to deny CP, SOB, N/V/D  Objective:   DG Lumbar Spine 2-3 Views  Result Date: 04/25/2020 CLINICAL DATA:  Low back pain with lower extremity radicular symptoms EXAM: LUMBAR SPINE - 2-3 VIEW COMPARISON:  None. FINDINGS: Frontal, lateral, and spot lumbosacral lateral images were obtained. There are 5 non-rib-bearing lumbar type vertebral bodies. No fracture or spondylolisthesis. There is moderate disc space narrowing at L5-S1 with milder disc space narrowing at L4-5. Other disc spaces appear unremarkable. No erosive change. IMPRESSION: Osteoarthritic change at L5-S1 and to a lesser degree at L4-5. Other disc spaces appear unremarkable. No fracture or spondylolisthesis. Electronically Signed   By: Lowella Grip III M.D.   On: 04/25/2020 11:43   No results for input(s): WBC, HGB, HCT, PLT in the last 72 hours. No results for input(s): NA, K, CL, CO2, GLUCOSE, BUN, CREATININE, CALCIUM in the last 72 hours.  Intake/Output Summary (Last 24 hours) at 04/27/2020 0805 Last data filed at 04/27/2020 0746 Gross per 24 hour  Intake 1070 ml  Output 1160 ml  Net -90 ml     Physical Exam: Vital Signs Blood pressure (!) 162/106, pulse 73, temperature 97.9 F (36.6 C), temperature source Oral, resp. rate 20, height 5\' 11"  (1.803 m), weight 98 kg, SpO2 100 %.     General: No acute distress Mood and affect are appropriate Heart: Regular rate and rhythm no rubs murmurs or extra sounds Lungs: Clear to auscultation, breathing unlabored, no rales or wheezes Abdomen: Positive bowel sounds, soft nontender to palpation, nondistended Extremities: No clubbing, cyanosis, or edema Skin: No evidence of breakdown, no evidence of rash  RUE: 3/5 proximal to distal, unchanged, RLE: HF, KE 3-/4 ADF 0/5, unchanged Dysarthria, unchanged Right facial  weakness  Assessment/Plan: 1. Functional deficits secondary to Left pontine infarct which require 3+ hours per day of interdisciplinary therapy in a comprehensive inpatient rehab setting.  Physiatrist is providing close team supervision and 24 hour management of active medical problems listed below.  Physiatrist and rehab team continue to assess barriers to discharge/monitor patient progress toward functional and medical goals  Care Tool:  Bathing    Body parts bathed by patient: Right arm, Chest, Abdomen, Right upper leg, Left upper leg, Face, Front perineal area, Left arm, Left lower leg   Body parts bathed by helper: Buttocks, Right lower leg     Bathing assist Assist Level: Maximal Assistance - Patient 24 - 49%     Upper Body Dressing/Undressing Upper body dressing   What is the patient wearing?: Pull over shirt    Upper body assist Assist Level: Minimal Assistance - Patient > 75%    Lower Body Dressing/Undressing Lower body dressing      What is the patient wearing?: Incontinence brief, Pants     Lower body assist Assist for lower body dressing: Maximal Assistance - Patient 25 - 49%     Toileting Toileting    Toileting assist Assist for toileting: Maximal Assistance - Patient 25 - 49%     Transfers Chair/bed transfer  Transfers assist     Chair/bed transfer assist level: Moderate Assistance - Patient 50 - 74% (squat pivot)     Locomotion Ambulation   Ambulation assist   Ambulation activity did not occur: Safety/medical concerns          Walk 10 feet activity   Assist  Walk 10 feet activity  did not occur: Safety/medical concerns        Walk 50 feet activity   Assist Walk 50 feet with 2 turns activity did not occur: Safety/medical concerns         Walk 150 feet activity   Assist Walk 150 feet activity did not occur: Safety/medical concerns         Walk 10 feet on uneven surface  activity   Assist Walk 10 feet on uneven  surfaces activity did not occur: Safety/medical concerns         Wheelchair     Assist Will patient use wheelchair at discharge?: No (TBD)             Wheelchair 50 feet with 2 turns activity    Assist            Wheelchair 150 feet activity     Assist          Blood pressure (!) 162/106, pulse 73, temperature 97.9 F (36.6 C), temperature source Oral, resp. rate 20, height 5\' 11"  (1.803 m), weight 98 kg, SpO2 100 %.  Medical Problem List and Plan: 1.  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  Recommendations a 30-day cardiac event monitor as outpatient  Continue CIR PT, OT SLP  PRAFO ordered 2.  Antithrombotics: -DVT/anticoagulation: Lovenox             -antiplatelet therapy: Aspirin 325 mg and Plavix 75 mg daily x3 months then Plavix alone 3. Pain Management: Voltaren gel 4 times daily, Neurontin 300 mg 3 times daily  Controlled on 6/10 LLE pain chronic with normal L hip MRI 20-19, ? CVA related vs sciatic, increase gabapentin,  lumbar films show disc height loss L5-S1, no spondylolisthesis 4. Mood: Cymbalta 30 mg twice daily             -antipsychotic agents: Invega 3 mg daily 5. Neuropsych: This patient is capable of making decisions on his own behalf. 6. Skin/Wound Care: Routine skin checks 7. Fluids/Electrolytes/Nutrition: Routine in and outs with follow-up chemistries 8.  Diastolic congestive heart failure.  Monitor for any signs of fluid overload Filed Weights   04/25/20 0516 04/26/20 0515 04/27/20 0526  Weight: 97.7 kg 100.6 kg 98 kg   Baseline appear to be ~98kg 9.  Diabetes mellitus with peripheral neuropathy.  Hemoglobin A1c 7.7.  SSI.    Glucophage 500 mg twice daily prior to admission, resumed on 6/4 CBG (last 3)  Recent Labs    04/26/20 1712 04/26/20 2117 04/27/20 0542   GLUCAP 145* 145* 148*  good  control 10.  Hypertension.  Patient on Coreg 25 mg twice daily, Norvasc 10 mg daily, hydralazine 25 mg 3 times daily, Cozaar 50 mg daily prior to admission.  Resume as needed Vitals:   04/26/20 2009 04/27/20 0526  BP: (!) 155/96 (!) 162/106  Pulse: 87 73  Resp: 20 20  Temp: 98.3 F (36.8 C) 97.9 F (36.6 C)  SpO2: 100% 100%   Amlodipine 5 started on 6/4, increase to 10 on 6/7, on Coreg 25mg  BID  Resume Cozaar 11.  Hyperlipidemia.  Lipitor 12.  History of polysubstance tobacco and alcohol abuse.  Urine drug screen negative.  Provide counseling 13.  Post stroke dysphagia  D1 thins, advance diet as tolerated  LOS: 7 days A FACE TO FACE EVALUATION WAS PERFORMED  Luanna Salk Aydden Cumpian  04/27/2020, 8:05 AM

## 2020-04-27 NOTE — Progress Notes (Signed)
Occupational Therapy Session Note  Patient Details  Name: [name] MRN: [id number] Date of Birth: 1961-05-10  Today's Date: 04/27/2020 OT Individual Time: 0800-0902 OT Individual Time Calculation (min): 62 min    Short Term Goals: Week 1:  OT Short Term Goal 1 (Week 1): Pt will maintain midline balance EOB with no more than min assist during UB selfcare tasks. OT Short Term Goal 2 (Week 1): Pt will complete LB bathing sit to stand with max assist of one person. OT Short Term Goal 3 (Week 1): Pt will use the RUE as a gross assist for holding items to be opened with no more than min facilitation and min instructional cueing. OT Short Term Goal 4 (Week 1): Pt will complete UB dressing with mod assist for two consecutive sessions. OT Short Term Goal 5 (Week 1): Pt will complete squat pivot transfer to the drop arm commode with mod assist.  Skilled Therapeutic Interventions/Progress Updates:    Pt in bed to start session, agreeable to completion of shower and dressing tasks.  He was able to transfer to the EOB with min assist in preparation for shower transfer with use of the Stedy.  He was able to complete sit to stand on the [place] with min assist and then therapist rolled him into the shower.  He needed mod assist for removal of clothing sit to stand prior to bathing.  He completed bathing sit to stand with max assist.  He was able to integrate the RUE into bathing with min assist for washing, with min assist to help cross the RLE over the left knee to wash the foot.  He was able to transfer out to the wheelchair for dressing at the sink with max assist stand pivot.  Pt still with increased pushing to the right in standing with some weightshift to the right noted in sitting as well.  He was able to complete UB dressing with mod assist and LB dressing with max assist overall as well secondary to sit to stand and pushing to the right.  Finished session with pt up in the wheelchair working on  his breakfast with NT present for supervision.  Call button and phone in reach with safety belt in place.    Therapy Documentation Precautions:  Precautions Precautions: Fall Precaution Comments: pusher, right hemiparesis Restrictions Weight Bearing Restrictions: No  Pain: Pain Assessment Pain Scale: Faces Pain Score: 2  Faces Pain Scale: Hurts little more Pain Type: Acute pain Pain Location: Hip Pain Orientation: Left Pain Descriptors / Indicators: Discomfort;Grimacing Pain Onset: With Activity Pain Intervention(s): Emotional support;Repositioned ADL: See Care Tool Section for some details of mobility and selfcare  Therapy/Group: Individual Therapy  Johniece Hornbaker OTR/L 04/27/2020, 9:03 AM

## 2020-04-28 ENCOUNTER — Inpatient Hospital Stay (HOSPITAL_COMMUNITY): Payer: Medicaid Other | Admitting: Physical Therapy

## 2020-04-28 ENCOUNTER — Inpatient Hospital Stay (HOSPITAL_COMMUNITY): Payer: Medicaid Other | Admitting: Occupational Therapy

## 2020-04-28 ENCOUNTER — Inpatient Hospital Stay (HOSPITAL_COMMUNITY): Payer: Medicaid Other | Admitting: Speech Pathology

## 2020-04-28 LAB — GLUCOSE, CAPILLARY
Glucose-Capillary: 159 mg/dL — ABNORMAL HIGH (ref 70–99)
Glucose-Capillary: 163 mg/dL — ABNORMAL HIGH (ref 70–99)
Glucose-Capillary: 123 mg/dL — ABNORMAL HIGH (ref 70–99)
Glucose-Capillary: 136 mg/dL — ABNORMAL HIGH (ref 70–99)

## 2020-04-28 MED ORDER — LOSARTAN POTASSIUM 50 MG PO TABS
50.0000 mg | ORAL_TABLET | Freq: Every day | ORAL | Status: DC
  Administered 2020-04-28 – 2020-05-10 (×13): 50 mg via ORAL
  Filled 2020-04-28 (×13): qty 1

## 2020-04-28 MED ORDER — GABAPENTIN 300 MG PO CAPS
600.0000 mg | ORAL_CAPSULE | Freq: Three times a day (TID) | ORAL | Status: DC
  Administered 2020-04-28 – 2020-05-06 (×25): 600 mg via ORAL
  Filled 2020-04-28 (×25): qty 2

## 2020-04-28 NOTE — Progress Notes (Signed)
Occupational Therapy Weekly Progress Note  Patient Details  Name: [name] MRN: [id number] Date of Birth: 1961/06/15  Beginning of progress report period: April 21, 2020 End of progress report period: April 28, 2020  Today's Date: 04/28/2020 OT Individual Time: 1105-1200 OT Individual Time Calculation (min): 55 min    Patient has met 4 of 5 short term goals.  Mr. Koskinen is making steady progress with OT at this time.  He is able to complete LB bathing with max assist sit to stand and LB dressing with max assist overall.  Sit to stand transitions are at mod assist level, however pt still demonstrates increased pushing to the right side in standing.  He is able to maintain static sitting balance with close supervision and dynamic sitting balance during selfcare tasks when sitting unsupported at min assist level.  Currently, he demonstrates isolated functional movement in the right arm and hand at a Brunnstrum stage V level overall and is using it at a diminished level for donning gripper socks.  He continues to exhibit increased left upper/lateral leg pain which increases dramatically in standing, and limits his standing endurance and mobility at this time.  Overall, feel he is making steady progress toward established LTGs set at min assist level overall.  Recommend continued OT at CIR level at this time.    Patient continues to demonstrate the following deficits: muscle weakness, impaired timing and sequencing, unbalanced muscle activation and decreased coordination and decreased sitting balance, decreased standing balance, decreased postural control, hemiplegia and decreased balance strategies and therefore will continue to benefit from skilled OT intervention to enhance overall performance with BADL and Reduce care partner burden.  Patient progressing toward long term goals..  Continue plan of care.  OT Short Term Goals Week 2:  OT Short Term Goal 1 (Week 2): Pt will complete squat  pivot transfer to the drop arm commode with mod assist. OT Short Term Goal 2 (Week 2): Pt will donn brief and pants with mod assist sit to stand for two consecutive sessions. OT Short Term Goal 3 (Week 2): Pt will complete UB dressing with supervision for 2 consecutive sessions. OT Short Term Goal 4 (Week 2): Pt will perform walk-in shower transfer with mod assist stand pivot.  Skilled Therapeutic Interventions/Progress Updates:    Session 1:  ( 1105-1200)  Pt in wheelchair just finishing with PT to start session.  He agreed to work on Dance movement psychotherapist at shower level during session.  Max assist was needed for stand pivot transfer from the wheelchair to the tub bench.  He was able to remove clothing for showering sit to stand with overall mod assist for LB and supervision with increased time for UB.  He completed showering sitting on the seat with overall supervision and mod instructional cueing for increased weightshift to the left in order to maintain midline orientation.  He did not attempt washing his buttocks or lower legs as when finished he stated "that's enough".  He completed max assist transfer out to the wheelchair for dressing at the sink.  Min assist for donning a pullover shirt following hemi techniques with max assist for donning a brief and pants.  He needed mod assist for crossing the RLE over the left knee and maintaining while donning the items.  Mod assist was also needed for sit to stand in order to pull them up over his hips.  He was able to donn his gripper socks with mod assist as well after therapist donned his  TEDs.  Finished session with max assist transfer squat pivot to the left to the bed.  Pt with decreased ability to complete full transfer secondary to pushing and needed two attempts to position his buttocks fully on the bed.  He was left with call button and phone in reach with safety alarm turned on.   Session 2: (1420-1501)  Pt in bed sleeping to start session.  Had him  transfer to sitting with max assist as well as for squat pivot transfer to the wheelchair.  Therapist took him down to the ortho gym where he worked on Brewing technologist for the RUE with use of the UE ergonometer.  He was able to complete 2 sets of 4 mins and 1 set of 3 mins with resistance on level 1.  He was not able to maintain right grip on the handle so ace bandage was used for sustaining this.  He completed the first set of 4 mins using BUEs and RPMs maintained around 15-20 per minute.  The final 2 sets of 4 mins and 3 mins were completed with isolated use of the RUE only.  He was able to complete one peddling forward with supervision and RPMs maintained at around 7-8.  The final set was completed with pt peddling backwards with min facilitation to complete.  Finished session with return back to the room and pt transferring back to the bed with max assist squat pivot to rest.  Call button and phone in reach with bed alarm in place.    Therapy Documentation Precautions:  Precautions Precautions: Fall Precaution Comments: pusher, right hemiparesis Restrictions Weight Bearing Restrictions: No  Pain:  Pt demonstrates pain at 4/10 in the left lateral hip  ADL: See Care Tool Section for some details of mobility and selfcare  Therapy/Group: Individual Therapy  Alizzon Dioguardi OTR/L 04/28/2020, 4:15 PM

## 2020-04-28 NOTE — Progress Notes (Signed)
Physical Therapy Weekly Progress Note  Patient Details  Name: [name] MRN: [id number] Date of Birth: August 16, 1961  Beginning of progress report period: April 21, 2020 End of progress report period: April 28, 2020  Today's Date: 04/28/2020 PT Individual Time: 1007-1103 PT Individual Time Calculation (min): 56 min   Patient has met 2 of 3 short term goals.  [name] is progressing well with therapy demonstrating improving midline orientation, motor planning, awareness,and functional strength as well as improving activity tolerance allowing increased participation in therapy. He is performing supine<>sit with max assist, sit<>stands with +2 max assist, and squat pivot transfers with max assist with the amount of assist varying on pt's fatigue level. He continues to demonstrate significant pushing with L LE that has caused significant pain and limits his progression to more standing activities at this time. Pt also has poor frustration tolerance when attempting new task stating "I can't" requiring significant encouragement and education to participate.  Patient continues to demonstrate the following deficits muscle weakness and muscle paralysis, decreased cardiorespiratoy endurance, impaired timing and sequencing, abnormal tone, unbalanced muscle activation, motor apraxia, decreased coordination and decreased motor planning, decreased midline orientation and decreased motor planning, decreased attention, decreased awareness, decreased problem solving, decreased safety awareness, decreased memory and delayed processing and decreased sitting balance, decreased standing balance, decreased postural control and decreased balance strategies and therefore will continue to benefit from skilled PT intervention to increase functional independence with mobility.  Patient progressing toward long term goals..  Continue plan of care.  PT Short Term Goals Week 1:  PT Short Term Goal 1 (Week 1): Pt will  perform supine<>sit with mod assist PT Short Term Goal 1 - Progress (Week 1): Progressing toward goal PT Short Term Goal 2 (Week 1): Pt will perform bed<>chair transfer with +2 max assist (not using lift) PT Short Term Goal 2 - Progress (Week 1): Met PT Short Term Goal 3 (Week 1): Pt will perform sit<>stand using LRAD with +2 max assist (not using lift) PT Short Term Goal 3 - Progress (Week 1): Met Week 2:  PT Short Term Goal 1 (Week 2): Pt will perform supine<>sit with mod assist consistently without bed features PT Short Term Goal 2 (Week 2): Pt will perform bed<>chair transfers consistently with mod assist of 1 PT Short Term Goal 3 (Week 2): Pt will perform sit<>stands consistently with +2 mod assist PT Short Term Goal 4 (Week 2): Pt will initiate gait training  Skilled Therapeutic Interventions/Progress Updates:  Ambulation/gait training;Community reintegration;DME/adaptive equipment instruction;Neuromuscular re-education;Psychosocial support;Stair training;UE/LE Strength taining/ROM;Wheelchair propulsion/positioning;Balance/vestibular training;Discharge planning;Functional electrical stimulation;Pain management;Skin care/wound management;Cognitive remediation/compensation;Functional mobility training;Therapeutic Activities;UE/LE Coordination activities;Disease management/prevention;Patient/family education;Splinting/orthotics;Therapeutic Exercise;Visual/perceptual remediation/compensation   Pt received supine in bed and agreeable to therapy session. Supine>sititng L EOB using bed features with CGA. L squat pivot to w/c with mod assist for lifting/pivoting hips - continuing to focus on transfers this direction to minimize pushing and promote increased R LE muscle activation.  Transported to/from gym in w/c for time management and energy conservation. L squat pivot to mat with lighter mod assist. Pt agreeable to attempt progressing towards ambulation. 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 Redirect to sitting tasks due to increased pain. Sitting L lateral trunk lean onto forearm support with R UE cross body reaching to grasp horseshoe then return to midline and place on basketball goal - pt able to place horseshoes without assist today. Pt continues to report L hip pain therefore moved to R sidelying to provide rest break and stop pushing to allow muscles to relax - pt reports tenderness to palpation along IT band and hip abductors. Returned to sitting with heavy max assist for trunk upright and LE management off EOM. Sit>stand EOM>stedy with mod assist for lifting. Attempted to perform high perched sitting task in stedy with focus in progression from sitting to standing position but pt continues to report increased L hip pain and requests to return to sitting EOM. Therapist cued pt for L lateral trunk lean onto forearm support as this is typically a position of comfort because it decreases pushing. L lateral scoot transfer to w/c with mod assist for pivoting hips. Transported back to room and left seated in w/c in the care of [place], [place].  Therapy Documentation Precautions:  Precautions Precautions: Fall Precaution Comments: pusher, right hemiparesis Restrictions Weight Bearing Restrictions: No  Pain:   Continues to report L hip pain that increases with worsening of pushing when attempting to progress from sitting tasks.  Therapy/Group: Individual Therapy  Tawana Scale, PT, DPT 04/28/2020, 8:03 AM

## 2020-04-28 NOTE — Progress Notes (Signed)
[facility] PHYSICAL MEDICINE & REHABILITATION PROGRESS NOTE   Subjective/Complaints:  No issues overnite   ROS: Appears to deny CP, SOB, N/V/D  Objective:   No results found. No results for input(s): WBC, HGB, HCT, PLT in the last 72 hours. No results for input(s): NA, K, CL, CO2, GLUCOSE, BUN, CREATININE, CALCIUM in the last 72 hours.  Intake/Output Summary (Last 24 hours) at 04/28/2020 0812 Last data filed at 04/28/2020 0630 Gross per 24 hour  Intake 420 ml  Output 925 ml  Net -505 ml     Physical Exam: Vital Signs Blood pressure (!) 149/121, pulse 71, temperature 97.8 F (36.6 C), temperature source Oral, resp. rate 16, height 5\' 11"  (1.803 m), weight 97 kg, SpO2 96 %.     General: No acute distress Mood and affect are appropriate Heart: Regular rate and rhythm no rubs murmurs or extra sounds Lungs: Clear to auscultation, breathing unlabored, no rales or wheezes Abdomen: Positive bowel sounds, soft nontender to palpation, nondistended Extremities: No clubbing, cyanosis, or edema Skin: No evidence of breakdown, no evidence of rash  RUE: 3/5 proximal to distal, unchanged, RLE: HF, KE 3-/4 ADF 0/5, unchanged Dysarthria, unchanged Right facial weakness  Assessment/Plan: 1. Functional deficits secondary to Left pontine infarct which require 3+ hours per day of interdisciplinary therapy in a comprehensive inpatient rehab setting.  Physiatrist is providing close team supervision and 24 hour management of active medical problems listed below.  Physiatrist and rehab team continue to assess barriers to discharge/monitor patient progress toward functional and medical goals  Care Tool:  Bathing    Body parts bathed by patient: Right arm, Chest, Abdomen, Right upper leg, Left upper leg, Face, Front perineal area, Left arm, Left lower leg, Right lower leg   Body parts bathed by helper: Buttocks     Bathing assist Assist Level: Maximal Assistance - Patient 24 - 49%      Upper Body Dressing/Undressing Upper body dressing   What is the patient wearing?: Pull over shirt    Upper body assist Assist Level: Moderate Assistance - Patient 50 - 74%    Lower Body Dressing/Undressing Lower body dressing      What is the patient wearing?: Incontinence brief, Pants     Lower body assist Assist for lower body dressing: Maximal Assistance - Patient 25 - 49%     Toileting Toileting    Toileting assist Assist for toileting: Maximal Assistance - Patient 25 - 49%     Transfers Chair/bed transfer  Transfers assist     Chair/bed transfer assist level: Moderate Assistance - Patient 50 - 74%     Locomotion Ambulation   Ambulation assist   Ambulation activity did not occur: Safety/medical concerns          Walk 10 feet activity   Assist  Walk 10 feet activity did not occur: Safety/medical concerns        Walk 50 feet activity   Assist Walk 50 feet with 2 turns activity did not occur: Safety/medical concerns         Walk 150 feet activity   Assist Walk 150 feet activity did not occur: Safety/medical concerns         Walk 10 feet on uneven surface  activity   Assist Walk 10 feet on uneven surfaces activity did not occur: Safety/medical concerns         Wheelchair     Assist Will patient use wheelchair at discharge?: No (TBD)  Wheelchair 50 feet with 2 turns activity    Assist            Wheelchair 150 feet activity     Assist          Blood pressure (!) 149/121, pulse 71, temperature 97.8 F (36.6 C), temperature source Oral, resp. rate 16, height 5\' 11"  (1.803 m), weight 97 kg, SpO2 96 %.  Medical Problem List and Plan: 1.  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  Recommendations a 30-day cardiac event monitor as  outpatient  Continue CIR PT, OT SLP  PRAFO ordered 2.  Antithrombotics: -DVT/anticoagulation: Lovenox             -antiplatelet therapy: Aspirin 325 mg and Plavix 75 mg daily x3 months then Plavix alone 3. Pain Management: Voltaren gel 4 times daily, Neurontin 300 mg 3 times daily  Controlled on 6/10 LLE pain chronic with normal L hip MRI 20-19, ? CVA related vs sciatic, increase gabapentin,  lumbar films show disc height loss L5-S1, no spondylolisthesis 4. Mood: Cymbalta 30 mg twice daily             -antipsychotic agents: Invega 3 mg daily 5. Neuropsych: This patient is capable of making decisions on his own behalf. 6. Skin/Wound Care: Routine skin checks 7. Fluids/Electrolytes/Nutrition: Routine in and outs with follow-up chemistries 8.  Diastolic congestive heart failure.  Monitor for any signs of fluid overload Filed Weights   04/26/20 0515 04/27/20 0526 04/28/20 0433  Weight: 100.6 kg 98 kg 97 kg   Baseline appear to be ~98kg 9.  Diabetes mellitus with peripheral neuropathy.  Hemoglobin A1c 7.7.  SSI.    Glucophage 500 mg twice daily prior to admission, resumed on 6/4 CBG (last 3)  Recent Labs    04/27/20 1650 04/27/20 2110 04/28/20 0554  GLUCAP 142* 127* 159*  good  Control 6/11 10.  Hypertension.  Patient on Coreg 25 mg twice daily, Norvasc 10 mg daily, hydralazine 25 mg 3 times daily, Cozaar 50 mg daily prior to admission.  Resume as needed Vitals:   04/27/20 1953 04/28/20 0433  BP: (!) 157/113 (!) 149/121  Pulse: 81 71  Resp: 16 16  Temp: 98.5 F (36.9 C) 97.8 F (36.6 C)  SpO2: 96% 96%   Amlodipine 5 started on 6/4, increase to 10 on 6/7, on Coreg 25mg  BID   Cozaar 25mg  need to increase to 50mg  , add hydralazine if this is not helpful 11.  Hyperlipidemia.  Lipitor 12.  History of polysubstance tobacco and alcohol abuse.  Urine drug screen negative.  Provide counseling 13.  Post stroke dysphagia  D1 thins, advance diet as tolerated 14.  LLE sciatic pain , will  increase gabapentin to 600mg   LOS: 8 days A FACE TO FACE EVALUATION WAS PERFORMED  Charlett Blake 04/28/2020, 8:12 AM

## 2020-04-28 NOTE — Progress Notes (Signed)
Speech Language Pathology Weekly Progress and Session Note  Patient Details  Name: [name] MRN: [id number] Date of Birth: Oct 23, 1961  Beginning of progress report period: April 21, 2020 End of progress report period: April 28, 2020  Today's Date: 04/28/2020 SLP Individual Time: 9678-9381 SLP Individual Time Calculation (min): 28 min  Short Term Goals: Week 1: SLP Short Term Goal 1 (Week 1): Pt will use small bites and sips to mitigate difficulty with oral manipulation and clearance as well as overt s/s of aspiration with dys 1 textures and thin liquids. SLP Short Term Goal 1 - Progress (Week 1): Met SLP Short Term Goal 2 (Week 1): Pt will consume therapeutic trials of dys 2 textures with min cues to clear solids from the oral cavity in a timely manner over 3 consecutive sessions prior to advancement. SLP Short Term Goal 2 - Progress (Week 1): Met SLP Short Term Goal 3 (Week 1): Pt will utilize slow rate, overarticulation, and increased vocal intensity to achieve intelligibility at the word level with mod assist verbal cues. SLP Short Term Goal 3 - Progress (Week 1): Met SLP Short Term Goal 4 (Week 1): Pt will recall daily information with min cues for use of external aids. SLP Short Term Goal 4 - Progress (Week 1): Progressing toward goal SLP Short Term Goal 5 (Week 1): Pt will complete basic, familiar tasks with min cues for functional problem solving. SLP Short Term Goal 5 - Progress (Week 1): Met    New Short Term Goals: Week 2: SLP Short Term Goal 1 (Week 2): Pt will use swallow strategies to achieve efficient mastication and oral clearance of Dys 2 solid texture diet with Supervision A verbal cues for use of strategies. SLP Short Term Goal 2 (Week 2): Pt will consume therapeutic trials of thin liquids per water protocol with minimal overt s/sx aspiration over 3 consectutive sessions prior to upgrade. SLP Short Term Goal 3 (Week 2): Pt will utilize slow rate,  overarticulation, and increased vocal intensity to achieve intelligibility at the word level with mod assist verbal cues. SLP Short Term Goal 4 (Week 2): Pt will recall daily information with min cues for use of external aids. SLP Short Term Goal 5 (Week 2): Pt will detect functional errors with Min verbal/visual cues. SLP Short Term Goal 6 (Week 2): Pt will sustain attention to functional tasks with Min A cues for redirection.  Weekly Progress Updates: Pt has made is making steady functional gainsand met 4 out of 5 short term goals this reporting period. Pt is currently Min-Mod assist for due to cognitive impairments impacting his sustained attention, functional problem solving, emergent awareness, and short term memory. He also has moderately severe dysarthria which reduces his intelligibility to ~60-70% at the phrase and sentence levels, requiring Mod A cues for use of strategies to compensate. Pt is consuming upgraded Dys 2 solid texture diet with nectar thick liquids. Pt is also on the free water protocol, but due to explosive coughing episodes with thin, pt prefers nectar thick liquids for ease of intake at this time. Pt education is ongoing; no family has been present for ST sessions. Pt would continue to benefit from skilled ST while inpatient in order to maximize functional independence and reduce burden of care prior to discharge. Anticipate that pt will need 24/7 supervision at discharge in addition to [facility] follow up at next level of care.       Intensity: Minumum of 1-2 x/day, 30 to 90 minutes Frequency: 3  to 5 out of 7 days Duration/Length of Stay: 05/16/20 Treatment/Interventions: Cognitive remediation/compensation;Cueing hierarchy;Internal/external aids;Patient/family education;Speech/Language facilitation;Functional tasks;Dysphagia/aspiration precaution training   Daily Session  Skilled Therapeutic Interventions: Pt was seen for skilled ST targeting speech and swallow goals. Pt  required Mod A verbal and visual cues for accurate use of EMST device today (for length of exhalations and self monitoring feedback from device). He completed 3 sets of 10 exercises with device set to 14 cm H2O with self perceived effort 7-8 out of 10. Recommend device remain at current level.  SLP further facilitated session with upgraded trial of Dysphagia 2 (minced/ground) solids. Pt demonstrated improved efficiency of mastication today, and complete oral clearance with Min A verbal cues for smaller bite size. Recommend pt upgrade to Dysphagia 2 solids, continue nectar thick liquids and crushed medications. Of note, pt with intermittent increased WOB/audible wheezy breathing sounds at baseline. Pt also with intermittent baseline cough, unrelated to PO intake. RN made aware. Pt left in bed with alarm set and needs within reach. Continue per current plan of care.         Pain Pain Assessment Pain Scale: Faces Faces Pain Scale: Hurts a little bit Pain Type: Acute pain Pain Location: Leg Pain Orientation: Left Pain Descriptors / Indicators: Aching Pain Frequency: Constant Pain Onset: On-going Patients Stated Pain Goal: 2 Pain Intervention(s): RN made aware Multiple Pain Sites: No  Therapy/Group: Individual Therapy  Arbutus Leas 04/28/2020, 7:02 AM

## 2020-04-29 DIAGNOSIS — M5416 Radiculopathy, lumbar region: Secondary | ICD-10-CM

## 2020-04-29 LAB — GLUCOSE, CAPILLARY
Glucose-Capillary: 156 mg/dL — ABNORMAL HIGH (ref 70–99)
Glucose-Capillary: 105 mg/dL — ABNORMAL HIGH (ref 70–99)
Glucose-Capillary: 149 mg/dL — ABNORMAL HIGH (ref 70–99)
Glucose-Capillary: 120 mg/dL — ABNORMAL HIGH (ref 70–99)

## 2020-04-29 MED ORDER — HYDRALAZINE HCL 10 MG PO TABS
10.0000 mg | ORAL_TABLET | Freq: Three times a day (TID) | ORAL | Status: DC
  Administered 2020-04-29 – 2020-05-02 (×9): 10 mg via ORAL
  Filled 2020-04-29 (×9): qty 1

## 2020-04-29 NOTE — Progress Notes (Signed)
[facility] PHYSICAL MEDICINE & REHABILITATION PROGRESS NOTE   Subjective/Complaints:  Reports left knee pain. Hurts when he's bearing weight on that leg.   ROS: Patient denies fever, rash, sore throat, blurred vision, nausea, vomiting, diarrhea, cough, shortness of breath or chest pain,  headache, or mood change.    Objective:   No results found. No results for input(s): WBC, HGB, HCT, PLT in the last 72 hours. No results for input(s): NA, K, CL, CO2, GLUCOSE, BUN, CREATININE, CALCIUM in the last 72 hours.  Intake/Output Summary (Last 24 hours) at 04/29/2020 1042 Last data filed at 04/29/2020 0611 Gross per 24 hour  Intake 240 ml  Output 1000 ml  Net -760 ml     Physical Exam: Vital Signs Blood pressure (!) 155/115, pulse 80, temperature 98.5 F (36.9 C), resp. rate 16, height 5\' 11"  (1.803 m), weight 96.8 kg, SpO2 93 %.     Constitutional: No distress . Vital signs reviewed. HEENT: EOMI, oral membranes moist Neck: supple Cardiovascular: RRR without murmur. No JVD    Respiratory/Chest: CTA Bilaterally without wheezes or rales. Normal effort    GI/Abdomen: BS +, non-tender, non-distended Ext: no clubbing, cyanosis, or edema Psych: pleasant and cooperative Skin: No evidence of breakdown, no evidence of rash Musc: left knee sl warm, tender with ROM, no crepitus, minimal effusion RUE: 3/5 proximal to distal, unchanged, RLE: HF, KE 3-/4 ADF 0/5, unchanged Dysarthria, stable Right facial weakness  Assessment/Plan: 1. Functional deficits secondary to Left pontine infarct which require 3+ hours per day of interdisciplinary therapy in a comprehensive inpatient rehab setting.  Physiatrist is providing close team supervision and 24 hour management of active medical problems listed below.  Physiatrist and rehab team continue to assess barriers to discharge/monitor patient progress toward functional and medical goals  Care Tool:  Bathing    Body parts bathed by patient:  Right arm, Left arm, Chest, Abdomen, Front perineal area, Right upper leg, Left upper leg, Face   Body parts bathed by helper: Buttocks Body parts n/a: Right lower leg, Left lower leg, Buttocks (did not attempt this session)   Bathing assist Assist Level: Moderate Assistance - Patient 50 - 74%     Upper Body Dressing/Undressing Upper body dressing   What is the patient wearing?: Pull over shirt    Upper body assist Assist Level: Minimal Assistance - Patient > 75%    Lower Body Dressing/Undressing Lower body dressing      What is the patient wearing?: Incontinence brief, Pants     Lower body assist Assist for lower body dressing: Maximal Assistance - Patient 25 - 49%     Toileting Toileting    Toileting assist Assist for toileting: Maximal Assistance - Patient 25 - 49%     Transfers Chair/bed transfer  Transfers assist     Chair/bed transfer assist level: Moderate Assistance - Patient 50 - 74% (squat pivot)     Locomotion Ambulation   Ambulation assist   Ambulation activity did not occur: Safety/medical concerns          Walk 10 feet activity   Assist  Walk 10 feet activity did not occur: Safety/medical concerns        Walk 50 feet activity   Assist Walk 50 feet with 2 turns activity did not occur: Safety/medical concerns         Walk 150 feet activity   Assist Walk 150 feet activity did not occur: Safety/medical concerns         Walk 10  feet on uneven surface  activity   Assist Walk 10 feet on uneven surfaces activity did not occur: Safety/medical concerns         Wheelchair     Assist Will patient use wheelchair at discharge?: No (TBD)             Wheelchair 50 feet with 2 turns activity    Assist            Wheelchair 150 feet activity     Assist          Blood pressure (!) 155/115, pulse 80, temperature 98.5 F (36.9 C), resp. rate 16, height 5\' 11"  (1.803 m), weight 96.8 kg, SpO2 93  %.  Medical Problem List and Plan: 1.  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  Recommendations a 30-day cardiac event monitor as outpatient  Continue CIR PT, OT SLP  PRAFO ordered 2.  Antithrombotics: -DVT/anticoagulation: Lovenox             -antiplatelet therapy: Aspirin 325 mg and Plavix 75 mg daily x3 months then Plavix alone 3. Pain Management: Voltaren gel 4 times daily, Neurontin 300 mg 3 times daily  Controlled on 6/10  -LLE pain chronic with normal L hip MRI 20-19, ? CVA related vs sciatic, increase gabapentin,  lumbar films show disc height loss L5-S1, no spondylolisthesis. Gabapentin increased to 600mg  tid 6/11  -6/12 pt reported left knee pain to me today. However this may be referred pain d/t above. Observe with increase gabapentin   -encouraged use of Ice to left knee as well 4. Mood: Cymbalta 30 mg twice daily             -antipsychotic agents: Invega 3 mg daily 5. Neuropsych: This patient is capable of making decisions on his own behalf. 6. Skin/Wound Care: Routine skin checks 7. Fluids/Electrolytes/Nutrition: Routine in and outs with follow-up chemistries 8.  Diastolic congestive heart failure.  Monitor for any signs of fluid overload Filed Weights   04/27/20 0526 04/28/20 0433 04/29/20 0413  Weight: 98 kg 97 kg 96.8 kg   Baseline appear to be ~98kg--stable 9.  Diabetes mellitus with peripheral neuropathy.  Hemoglobin A1c 7.7.  SSI.    Glucophage 500 mg twice daily prior to admission, resumed on 6/4 CBG (last 3)  Recent Labs    04/28/20 1631 04/28/20 2108 04/29/20 0612  GLUCAP 123* 136* 156*  reasonable  Control 6/12 10.  Hypertension.  Patient on Coreg 25 mg twice daily, Norvasc 10 mg daily, hydralazine 25 mg 3 times daily, Cozaar 50 mg daily prior to admission.  Resume as needed Vitals:   04/28/20  2003 04/29/20 0413  BP: (!) 152/103 (!) 155/115  Pulse: 75 80  Resp: 18 16  Temp: 98.4 F (36.9 C) 98.5 F (36.9 C)  SpO2: 97% 93%   Amlodipine 5 started on 6/4, increase to 10 on 6/7, on Coreg 25mg  BID   Cozaar 25mg  need to increase to 50mg  , add hydralazine if this is not helpful  6/12  Continued elevation despite above   -add hydralazine 10mg  tid 11.  Hyperlipidemia.  Lipitor 12.  History of polysubstance tobacco and alcohol abuse.  Urine drug screen negative.  Provide counseling 13.  Post stroke dysphagia  D1 thins, advance diet as tolerated   LOS: 9 days A FACE TO FACE EVALUATION WAS PERFORMED  Alroy Dust T  Naaman Plummer 04/29/2020, 10:42 AM

## 2020-04-30 ENCOUNTER — Inpatient Hospital Stay (HOSPITAL_COMMUNITY): Payer: Medicaid Other | Admitting: Speech Pathology

## 2020-04-30 ENCOUNTER — Inpatient Hospital Stay (HOSPITAL_COMMUNITY): Payer: Medicaid Other | Admitting: Occupational Therapy

## 2020-04-30 LAB — GLUCOSE, CAPILLARY
Glucose-Capillary: 149 mg/dL — ABNORMAL HIGH (ref 70–99)
Glucose-Capillary: 148 mg/dL — ABNORMAL HIGH (ref 70–99)
Glucose-Capillary: 141 mg/dL — ABNORMAL HIGH (ref 70–99)
Glucose-Capillary: 124 mg/dL — ABNORMAL HIGH (ref 70–99)

## 2020-04-30 NOTE — Progress Notes (Signed)
[facility] PHYSICAL MEDICINE & REHABILITATION PROGRESS NOTE   Subjective/Complaints:  Pt reports ongoing leg pain but that it has been better with increased gabapentin.   ROS: Patient denies fever, rash, sore throat, blurred vision, nausea, vomiting, diarrhea, cough, shortness of breath or chest pain,   headache, or mood change.    Objective:   No results found. No results for input(s): WBC, HGB, HCT, PLT in the last 72 hours. No results for input(s): NA, K, CL, CO2, GLUCOSE, BUN, CREATININE, CALCIUM in the last 72 hours.  Intake/Output Summary (Last 24 hours) at 04/30/2020 0933 Last data filed at 04/30/2020 7824 Gross per 24 hour  Intake 480 ml  Output 625 ml  Net -145 ml     Physical Exam: Vital Signs Blood pressure (!) 179/93, pulse 71, temperature 97.7 F (36.5 C), temperature source Oral, resp. rate 18, height 5\' 11"  (1.803 m), weight 100.6 kg, SpO2 97 %.     Constitutional: No distress . Vital signs reviewed. HEENT: EOMI, oral membranes moist Neck: supple Cardiovascular: RRR without murmur. No JVD    Respiratory/Chest: CTA Bilaterally without wheezes or rales. Normal effort    GI/Abdomen: BS +, non-tender, non-distended Ext: no clubbing, cyanosis, or edema Psych: pleasant and cooperative Skin: No evidence of breakdown, no evidence of rash Musc: mild pain with PROM LLE, no focal knee abnl today RUE: 3/5 proximal to distal, unchanged, RLE: HF, KE 3-/4 ADF 0/5, unchanged Dysarthria significant Right facial weakness  Assessment/Plan: 1. Functional deficits secondary to Left pontine infarct which require 3+ hours per day of interdisciplinary therapy in a comprehensive inpatient rehab setting.  Physiatrist is providing close team supervision and 24 hour management of active medical problems listed below.  Physiatrist and rehab team continue to assess barriers to discharge/monitor patient progress toward functional and medical goals  Care Tool:  Bathing    Body  parts bathed by patient: Right arm, Left arm, Chest, Abdomen, Front perineal area, Right upper leg, Left upper leg, Face   Body parts bathed by helper: Buttocks Body parts n/a: Right lower leg, Left lower leg, Buttocks (did not attempt this session)   Bathing assist Assist Level: Moderate Assistance - Patient 50 - 74%     Upper Body Dressing/Undressing Upper body dressing   What is the patient wearing?: Pull over shirt    Upper body assist Assist Level: Minimal Assistance - Patient > 75%    Lower Body Dressing/Undressing Lower body dressing      What is the patient wearing?: Incontinence brief, Pants     Lower body assist Assist for lower body dressing: Maximal Assistance - Patient 25 - 49%     Toileting Toileting    Toileting assist Assist for toileting: Maximal Assistance - Patient 25 - 49%     Transfers Chair/bed transfer  Transfers assist     Chair/bed transfer assist level: Moderate Assistance - Patient 50 - 74% (squat pivot)     Locomotion Ambulation   Ambulation assist   Ambulation activity did not occur: Safety/medical concerns          Walk 10 feet activity   Assist  Walk 10 feet activity did not occur: Safety/medical concerns        Walk 50 feet activity   Assist Walk 50 feet with 2 turns activity did not occur: Safety/medical concerns         Walk 150 feet activity   Assist Walk 150 feet activity did not occur: Safety/medical concerns  Walk 10 feet on uneven surface  activity   Assist Walk 10 feet on uneven surfaces activity did not occur: Safety/medical concerns         Wheelchair     Assist Will patient use wheelchair at discharge?: No (TBD)             Wheelchair 50 feet with 2 turns activity    Assist            Wheelchair 150 feet activity     Assist          Blood pressure (!) 179/93, pulse 71, temperature 97.7 F (36.5 C), temperature source Oral, resp. rate 18, height  5\' 11"  (1.803 m), weight 100.6 kg, SpO2 97 %.  Medical Problem List and Plan: 1.  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  Recommendations a 30-day cardiac event monitor as outpatient  Continue CIR PT, OT SLP  PRAFO ordered 2.  Antithrombotics: -DVT/anticoagulation: Lovenox             -antiplatelet therapy: Aspirin 325 mg and Plavix 75 mg daily x3 months then Plavix alone 3. Pain Management: Voltaren gel 4 times daily, Neurontin 300 mg 3 times daily  Controlled on 6/10  -LLE pain chronic with normal L hip MRI 20-19, ? CVA related vs sciatic, increase gabapentin,  lumbar films show disc height loss L5-S1, no spondylolisthesis. Gabapentin increased to 600mg  tid 6/11  -6/13 pt reported knee pain yesterday, but indicated today it was more his back/leg. Recent increase in gabapentin helpful---continue  4. Mood: Cymbalta 30 mg twice daily             -antipsychotic agents: Invega 3 mg daily 5. Neuropsych: This patient is capable of making decisions on his own behalf. 6. Skin/Wound Care: Routine skin checks 7. Fluids/Electrolytes/Nutrition: Routine in and outs with follow-up chemistries 8.  Diastolic congestive heart failure.  Monitor for any signs of fluid overload Filed Weights   04/28/20 0433 04/29/20 0413 04/30/20 0500  Weight: 97 kg 96.8 kg 100.6 kg   Baseline appear to be ~98kg----up to 100kg 6/13--does not appear volume overloaded, I/O's negative 145cc yesterday   -follow for trend, accuracy of weights  9.  Diabetes mellitus with peripheral neuropathy.  Hemoglobin A1c 7.7.  SSI.    Glucophage 500 mg twice daily prior to admission, resumed on 6/4 CBG (last 3)  Recent Labs    04/29/20 1646 04/29/20 2103 04/30/20 0640  GLUCAP 149* 120* 149*  reasonable  Control 6/13 10.  Hypertension.  Patient on Coreg 25 mg twice daily,  Norvasc 10 mg daily, hydralazine 25 mg 3 times daily, Cozaar 50 mg daily prior to admission.  Resume as needed Vitals:   04/29/20 2045 04/30/20 0454  BP: (!) 145/93 (!) 179/93  Pulse: 79 71  Resp: 16 18  Temp: 98.3 F (36.8 C) 97.7 F (36.5 C)  SpO2: 100% 97%   Amlodipine 5 started on 6/4, increase to 10 on 6/7, on Coreg 25mg  BID   Cozaar 25mg  need to increase to 50mg  , add hydralazine if this is not helpful  6/12  Continued elevation despite above   -added hydralazine 10mg  tid  6/13 some improvement so far. Observe today before making more changes 11.  Hyperlipidemia.  Lipitor 12.  History of polysubstance tobacco and alcohol abuse.  Urine drug screen negative.  Provide counseling 13.  Post stroke dysphagia  D2, nectars, advance diet as tolerated   -recheck bmet tomorrow LOS: 10 days A FACE TO FACE EVALUATION WAS PERFORMED  Meredith Staggers 04/30/2020, 9:33 AM

## 2020-04-30 NOTE — Progress Notes (Signed)
Occupational Therapy Session Note  Patient Details  Name: [name] MRN: [id number] Date of Birth: 1961/03/12  Today's Date: 04/30/2020 OT Individual Time: 4982-6415 OT Individual Time Calculation (min): 44 min   Short Term Goals: Week 2:  OT Short Term Goal 1 (Week 2): Pt will complete squat pivot transfer to the drop arm commode with mod assist. OT Short Term Goal 2 (Week 2): Pt will donn brief and pants with mod assist sit to stand for two consecutive sessions. OT Short Term Goal 3 (Week 2): Pt will complete UB dressing with supervision for 2 consecutive sessions. OT Short Term Goal 4 (Week 2): Pt will perform walk-in shower transfer with mod assist stand pivot.  Skilled Therapeutic Interventions/Progress Updates:    Pt greeted in bed with no c/o pain, finishing up breakfast with RN present to supervise. Min facilitation for including Rt hand as stabilizer when eating applesauce and during bilateral involvement when drinking beverages. Pt required vcs to slow rate of eating per swallowing precautions. Note that pt has poor attention to the Rt hand without facilitation to involve limb functionally. Afterwards pt reported needing to urinate. Mod A for supine<sit where pt used the Lt bedrail to obtain neutral midline with cues, otherwise increased Rt pushing when EOB. OT completed clothing mgt and pt assisted OT with holding the urinal in place using his Rt hand. Given increased time, pt unable to void. Afterwards squat pivot<w/c completed with Max A going towards his Lt side. After scooting back in the w/c, he completed several squat stands with Mod A for OT to fully elevate pants over hips. Transitioned to seated sinkside activity. Mod facilitation for using the Rt hand to reach soap dispenser during handwashing and when crossing midline to reach for toothpaste during oral care. Pt able to squeeze toothpaste onto his toothbrush using the Rt hand with cuing alone, needed assistance to  involve affected limb when capping toothpaste. He rinsed mouth using nectar thickened water, min facilitation to weight shift forward enough to spit into the sink vs on the front of his shirt. Pt also initiated combing hair using the comb. He was then positioned at beside, near the window per request. Left him with all needs within reach on the Lt side and safety belt fastened. Tx focus placed on NMR, Rt attention, sitting balance, and ADL retraining.    Therapy Documentation Precautions:  Precautions Precautions: Fall Precaution Comments: pusher, right hemiparesis Restrictions Weight Bearing Restrictions: No Vital Signs:  ADL: ADL Eating: Minimal assistance Where Assessed-Eating: Wheelchair Grooming: Minimal assistance Where Assessed-Grooming: Wheelchair Upper Body Bathing: Moderate assistance Where Assessed-Upper Body Bathing: Edge of bed Lower Body Bathing: Other (comment) (total +2) Where Assessed-Lower Body Bathing: Other (Comment), Edge of bed (total +2) Upper Body Dressing: Maximal assistance Where Assessed-Upper Body Dressing: Edge of bed Lower Body Dressing: Other (Comment) (total +2) Toileting: Other (Comment) (total +2) Where Assessed-Toileting: Bedside Commode Toilet Transfer: Other (comment) (total +2) Toilet Transfer Method: Stand pivot Toilet Transfer Equipment: Bedside commode     Therapy/Group: Individual Therapy  [name] A Tinzlee Craker 04/30/2020, 12:43 PM

## 2020-04-30 NOTE — Progress Notes (Signed)
Speech Language Pathology Daily Session Note  Patient Details  Name: [name] MRN: [id number] Date of Birth: [birth year]  Today's Date: 04/30/2020 SLP Individual Time: 1052-1130 SLP Individual Time Calculation (min): 38 min  Short Term Goals: Week 2: SLP Short Term Goal 1 (Week 2): Pt will use swallow strategies to achieve efficient mastication and oral clearance of Dys 2 solid texture diet with Supervision A verbal cues for use of strategies. SLP Short Term Goal 2 (Week 2): Pt will consume therapeutic trials of thin liquids per water protocol with minimal overt s/sx aspiration over 3 consectutive sessions prior to upgrade. SLP Short Term Goal 3 (Week 2): Pt will utilize slow rate, overarticulation, and increased vocal intensity to achieve intelligibility at the word level with mod assist verbal cues. SLP Short Term Goal 4 (Week 2): Pt will recall daily information with min cues for use of external aids. SLP Short Term Goal 5 (Week 2): Pt will detect functional errors with Min verbal/visual cues. SLP Short Term Goal 6 (Week 2): Pt will sustain attention to functional tasks with Min A cues for redirection.  Skilled Therapeutic Interventions:  Pt was seen for skilled ST targeting goals for dysphagia and communication.  Pt had immediate coughing with cup sips of thin liquids and reported feeling that thin liquids "get jammed" in his throat.  Pt reports improvement of symptoms with nectar thick liquids and demonstrated decreased coughing.  Recommend that pt remain on his currently prescribed diet with trials of water in between meals per the water protocol.  Pt completed 30 repetitions with RMST device with min cues for proper device use and a self perceived effort level of 8/10.  During functional conversations with the therapist pt had decreased regulation of speech intelligibility with minimal use of strategies which resulted in minimal intelligibility at the conversational level.   However, during a targeted speech task pt was able to recognize and correct intelligibility errors with min assist faded to supervision verbal cues at the word level.  SLP reinforced the importance of using intelligibility strategies during conversations with caregivers and also provided skilled education regarding interpreting communication partners' facial expressions and being proactive in asking others whether pt needed to repeat himself to clarify in order to decrease frustration with communication.   Pt was left in wheelchair with chair alarm set and call bell within reach.  Continue per current plan of care.    Pain Pain Assessment Pain Scale: 0-10 Pain Score: 0-No pain  Therapy/Group: Individual Therapy  Sylvan Lahm, Selinda Orion 04/30/2020, 11:31 AM

## 2020-05-01 ENCOUNTER — Inpatient Hospital Stay (HOSPITAL_COMMUNITY): Payer: Medicaid Other | Admitting: Occupational Therapy

## 2020-05-01 ENCOUNTER — Inpatient Hospital Stay (HOSPITAL_COMMUNITY): Payer: Medicaid Other

## 2020-05-01 ENCOUNTER — Inpatient Hospital Stay (HOSPITAL_COMMUNITY): Payer: Medicaid Other | Admitting: Speech Pathology

## 2020-05-01 LAB — BASIC METABOLIC PANEL
Anion gap: 12 (ref 5–15)
BUN: 20 mg/dL (ref 6–20)
CO2: 26 mmol/L (ref 22–32)
Calcium: 9.3 mg/dL (ref 8.9–10.3)
Chloride: 101 mmol/L (ref 98–111)
Creatinine, Ser: 1.32 mg/dL — ABNORMAL HIGH (ref 0.61–1.24)
GFR calc Af Amer: >60 mL/min (ref >60)
GFR calc non Af Amer: 59 mL/min — ABNORMAL LOW (ref >60)
Glucose, Bld: 131 mg/dL — ABNORMAL HIGH (ref 70–99)
Potassium: 3.9 mmol/L (ref 3.5–5.1)
Sodium: 139 mmol/L (ref 135–145)

## 2020-05-01 LAB — GLUCOSE, CAPILLARY
Glucose-Capillary: 146 mg/dL — ABNORMAL HIGH (ref 70–99)
Glucose-Capillary: 158 mg/dL — ABNORMAL HIGH (ref 70–99)
Glucose-Capillary: 118 mg/dL — ABNORMAL HIGH (ref 70–99)
Glucose-Capillary: 135 mg/dL — ABNORMAL HIGH (ref 70–99)

## 2020-05-01 NOTE — Progress Notes (Signed)
Speech Language Pathology Daily Session Note  Patient Details  Name: [name] MRN: [id number] Date of Birth: [birth year]  Today's Date: 05/01/2020 SLP Individual Time: 1033-1130 SLP Individual Time Calculation (min): 57 min  Short Term Goals: Week 2: SLP Short Term Goal 1 (Week 2): Pt will use swallow strategies to achieve efficient mastication and oral clearance of Dys 2 solid texture diet with Supervision A verbal cues for use of strategies. SLP Short Term Goal 2 (Week 2): Pt will consume therapeutic trials of thin liquids per water protocol with minimal overt s/sx aspiration over 3 consectutive sessions prior to upgrade. SLP Short Term Goal 3 (Week 2): Pt will utilize slow rate, overarticulation, and increased vocal intensity to achieve intelligibility at the word level with mod assist verbal cues. SLP Short Term Goal 4 (Week 2): Pt will recall daily information with min cues for use of external aids. SLP Short Term Goal 5 (Week 2): Pt will detect functional errors with Min verbal/visual cues. SLP Short Term Goal 6 (Week 2): Pt will sustain attention to functional tasks with Min A cues for redirection.  Skilled Therapeutic Interventions: Pt was seen for skilled ST targeting dysphagia and cognition. Although ST planned to assess tolerance of thins via water protocol, pt politely declined trials despite encouragement. Skilled observation provided with nectar thick juice and Dys 2 snack (graham cracker in pudding) to assess tolerance of current diet. He demonstrated overall improvements in oral/lingual manipulation of boluses, overall efficient mastication and oral clearance of Dys 2 solids with only Supervision A verbal cues for use of swallow strategies today. No overt s/sx aspiration observed across solid or liquid intake. Recommend continue current diet and ST will continue to provide opportunities for advancement as determined appropriate. Pt required Max A verbal cues to recall  last ST session, but good insight into short term memory deficits (stated he has difficulty remembering information since his stroke). Pt recalled EMST exercise regimen with Min A verbal cues.  He sustained attention throughout session with Min A verbal cues for redirection. Pt required Mod A verbal and visual cues for problem solving and error awareness during a basic medication management task from the ALFA. He also required overall Max A for problem solving semi-complex functional daily math situations.He did not finish the task due to time constraints and distractibility. Min A verbal and visual cues provided for sustained attention to tasks. Pt left sitting in chair with alarm set and needs within reach. Continue per current plan of care.        Pain Pain Assessment Pain Scale: 0-10 Pain Score: 0-No pain  Therapy/Group: Individual Therapy  Arbutus Leas 05/01/2020, 7:27 AM

## 2020-05-01 NOTE — Progress Notes (Signed)
Physical Therapy Session Note  Patient Details  Name: [name] MRN: [id number] Date of Birth: [birth year]  Today's Date: 05/01/2020 PT Individual Time: 1300-1417 PT Individual Time Calculation (min): 77 min   Short Term Goals:  Week 2:  PT Short Term Goal 1 (Week 2): Pt will perform supine<>sit with mod assist consistently without bed features PT Short Term Goal 2 (Week 2): Pt will perform bed<>chair transfers consistently with mod assist of 1 PT Short Term Goal 3 (Week 2): Pt will perform sit<>stands consistently with +2 mod assist PT Short Term Goal 4 (Week 2): Pt will initiate gait training  Skilled Therapeutic Interventions/Progress Updates:  Pt seated in w/c.  He stated that L hip hurt, unrated.  Squat pivot transfer to L, pulling on edge of mat to prevent pushing, min assist.  Mat table elevated intermittently during session to prevent pushing tendency.  neuromuscular re-education via forced use, visual targets, multimodal cues and demo for w/c propulsion using bil UEs x 50' with min assist for turns, and extra time; use of Kinetron from w/c level at 50 cm/sec using RLE only with PT operating other footplate; seated- reaching activity to facilitate L trunk lengthening/R trunk shortening; seated use of bil hands on stool in front of him for trunk flexion/extension and to decrease pusher tendencies. Reciprocal scooting L/R forward/backward for pelvic dissociation. Prolonged stretch R lateral hip with gait belt around thighs to align for trunk flexion/extension without use of UEs, with mirror feedback.    Therapeutic activity seated EOM, folding 3 towels with bil hands,and 3 washcloths with R hand only,  on table in front of him.  Playing cards activity, using R hand to retrieve from R and match to board on L, with min assist for placement. With gloves on, pt cleaned playing cards using bil hands, on table in front of him. Sit> stand from mat, using Stedy, with min/mod assist, x  3 pulling up on bar with bil hands.  Transfer training for forward wt shift and loading bil LEs without use of UEs while high- sitting on seat of Steady.  Use of Stedy to transfer to w/c.  At end of session, pt seated in w/c with needs at hand and seat belt alarm set.     Therapy Documentation Precautions:  Precautions Precautions: Fall Precaution Comments: pusher, right hemiparesis Restrictions Weight Bearing Restrictions: No        Therapy/Group: Individual Therapy  Dawsyn Zurn 05/01/2020, 2:22 PM

## 2020-05-01 NOTE — Progress Notes (Signed)
Occupational Therapy Session Note  Patient Details  Name: [name] MRN: [id number] Date of Birth: [birth year]  Today's Date: 05/01/2020 OT Individual Time: 6378-5885 OT Individual Time Calculation (min): 59 min    Short Term Goals: Week 2:  OT Short Term Goal 1 (Week 2): Pt will complete squat pivot transfer to the drop arm commode with mod assist. OT Short Term Goal 2 (Week 2): Pt will donn brief and pants with mod assist sit to stand for two consecutive sessions. OT Short Term Goal 3 (Week 2): Pt will complete UB dressing with supervision for 2 consecutive sessions. OT Short Term Goal 4 (Week 2): Pt will perform walk-in shower transfer with mod assist stand pivot.  Skilled Therapeutic Interventions/Progress Updates:    Pt completed functional transfer from supine to sit with mod assist and then completed squat pivot transfer to the wheelchair with mod assist for transport into the bathroom.  He then completed stand pivot transfer to the tub bench with use of the grab bars for support with max assist.  He completed all UB bathing with supervision and then he needed max assist for LB bathing sit to stand.  Increased pushing to the right in standing with decreased ability to maintain standing for approximately 1 min before needing to sit.  He was able to transfer back to the wheelchair once shower was completed with max assist in order to work on dressing at the sink.  He was able to complete UB dressing with mod assist for pullover scrub shirt with max assist for donning brief and pants sit to stand.  He continues to need mod assist for crossing the RLE over the left knee and maintaining while donning clothing.  He needed total assist for donning TEDs with mod assist for gripper socks.  Finished session with pt sitting up in the wheelchair at conclusion of session with call button and phone in reach with safety belt in place.    Therapy Documentation Precautions:   Precautions Precautions: Fall Precaution Comments: pusher, right hemiparesis Restrictions Weight Bearing Restrictions: No  Pain: Pain Assessment Pain Scale: 0-10 Pain Score: 0-No pain ADL: See Care Tool Section for some details of mobility and selfcare  Therapy/Group: Individual Therapy  Yumalay Circle OTR/L 05/01/2020, 12:27 PM

## 2020-05-01 NOTE — Progress Notes (Signed)
[place] PHYSICAL MEDICINE & REHABILITATION PROGRESS NOTE   Subjective/Complaints:  Left lower ext/hip pain better with increased gabapentin.   ROS: Patient denies CP, SOB, N/V/D  Objective:   No results found. No results for input(s): WBC, HGB, HCT, PLT in the last 72 hours. Recent Labs    05/01/20 0605  NA 139  K 3.9  CL 101  CO2 26  GLUCOSE 131*  BUN 20  CREATININE 1.32*  CALCIUM 9.3    Intake/Output Summary (Last 24 hours) at 05/01/2020 0756 Last data filed at 04/30/2020 2019 Gross per 24 hour  Intake 720 ml  Output 800 ml  Net -80 ml     Physical Exam: Vital Signs Blood pressure (!) 149/96, pulse 82, temperature 98.6 F (37 C), temperature source Oral, resp. rate 18, height 5\' 11"  (1.803 m), weight 102.1 kg, SpO2 100 %.   General: No acute distress Mood and affect are appropriate Heart: Regular rate and rhythm no rubs murmurs or extra sounds Lungs: Clear to auscultation, breathing unlabored, no rales or wheezes Abdomen: Positive bowel sounds, soft nontender to palpation, nondistended Extremities: No clubbing, cyanosis, or edema Skin: No evidence of breakdown, no evidence of rash  Musc: mild pain with PROM LLE, no focal knee abnl today RUE: 3/5 proximal to distal, unchanged, RLE: HF, KE 3-/4 ADF 0/5, unchanged Dysarthria significant Right facial weakness  Assessment/Plan: 1. Functional deficits secondary to Left pontine infarct which require 3+ hours per day of interdisciplinary therapy in a comprehensive inpatient rehab setting.  Physiatrist is providing close team supervision and 24 hour management of active medical problems listed below.  Physiatrist and rehab team continue to assess barriers to discharge/monitor patient progress toward functional and medical goals  Care Tool:  Bathing    Body parts bathed by patient: Right arm, Left arm, Chest, Abdomen, Front perineal area, Right upper leg, Left upper leg, Face   Body parts bathed by helper:  Buttocks Body parts n/a: Right lower leg, Left lower leg, Buttocks (did not attempt this session)   Bathing assist Assist Level: Moderate Assistance - Patient 50 - 74%     Upper Body Dressing/Undressing Upper body dressing   What is the patient wearing?: Pull over shirt    Upper body assist Assist Level: Minimal Assistance - Patient > 75%    Lower Body Dressing/Undressing Lower body dressing      What is the patient wearing?: Incontinence brief, Pants     Lower body assist Assist for lower body dressing: Maximal Assistance - Patient 25 - 49%     Toileting Toileting    Toileting assist Assist for toileting: Maximal Assistance - Patient 25 - 49%     Transfers Chair/bed transfer  Transfers assist     Chair/bed transfer assist level: Moderate Assistance - Patient 50 - 74% (squat pivot)     Locomotion Ambulation   Ambulation assist   Ambulation activity did not occur: Safety/medical concerns          Walk 10 feet activity   Assist  Walk 10 feet activity did not occur: Safety/medical concerns        Walk 50 feet activity   Assist Walk 50 feet with 2 turns activity did not occur: Safety/medical concerns         Walk 150 feet activity   Assist Walk 150 feet activity did not occur: Safety/medical concerns         Walk 10 feet on uneven surface  activity   Assist Walk 10 feet  on uneven surfaces activity did not occur: Safety/medical concerns         Wheelchair     Assist Will patient use wheelchair at discharge?: No (TBD)             Wheelchair 50 feet with 2 turns activity    Assist            Wheelchair 150 feet activity     Assist          Blood pressure (!) 149/96, pulse 82, temperature 98.6 F (37 C), temperature source Oral, resp. rate 18, height 5\' 11"  (1.803 m), weight 102.1 kg, SpO2 100 %.  Medical Problem List and Plan: 1.  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  Recommendations a 30-day cardiac event monitor as outpatient  Continue CIR PT, OT SLP  PRAFO ordered 2.  Antithrombotics: -DVT/anticoagulation: Lovenox             -antiplatelet therapy: Aspirin 325 mg and Plavix 75 mg daily x3 months then Plavix alone 3. Pain Management: Voltaren gel 4 times daily, Neurontin 300 mg 3 times daily  Controlled on 6/10  -LLE pain chronic with normal L hip MRI 2019, ? CVA related vs sciatic, increase gabapentin,  lumbar films show disc height loss L5-S1, no spondylolisthesis. Gabapentin increased to 600mg  tid 6/11   Recent increase in gabapentin helpful---continue  4. Mood: Cymbalta 30 mg twice daily             -antipsychotic agents: Invega 3 mg daily 5. Neuropsych: This patient is capable of making decisions on his own behalf. 6. Skin/Wound Care: Routine skin checks 7. Fluids/Electrolytes/Nutrition: Routine in and outs with follow-up chemistries 8.  Diastolic congestive heart failure.  Monitor for any signs of fluid overload Filed Weights   04/29/20 0413 04/30/20 0500 05/01/20 0500  Weight: 96.8 kg 100.6 kg 102.1 kg   Baseline appear to be ~98kg----up to 100kg 6/13--does not appear volume overloaded, I/O's negative 145cc yesterday   -follow for trend, accuracy of weights  9.  Diabetes mellitus with peripheral neuropathy.  Hemoglobin A1c 7.7.  SSI.    Glucophage 500 mg twice daily prior to admission, resumed on 6/4 CBG (last 3)  Recent Labs    04/30/20 1743 04/30/20 2112 05/01/20 0633  GLUCAP 141* 124* 146*  reasonable  Control 6/14 10.  Hypertension.  Patient on Coreg 25 mg twice daily, Norvasc 10 mg daily, hydralazine 25 mg 3 times daily, Cozaar 50 mg daily prior to admission.  Resume as needed Vitals:   04/30/20 1932 05/01/20 0628  BP: (!) 146/93 (!) 149/96  Pulse: 76 82  Resp: 16 18  Temp: 98.4 F (36.9  C) 98.6 F (37 C)  SpO2: 98% 100%   Amlodipine 5 started on 6/4, increase to 10 on 6/7, on Coreg 25mg  BID   Cozaar 25mg  need to increase to 50mg  , add hydralazine if this is not helpful  6/12  Continued elevation despite above   -added hydralazine 10mg  tid  2/44 mild systolic and diastolic elevation , will cont current meds  11.  Hyperlipidemia.  Lipitor 12.  History of polysubstance tobacco and alcohol abuse.  Urine drug screen negative.  Provide counseling 13.  Post stroke dysphagia  D2, nectars, advance diet as tolerated   -recheck bmet tomorrow LOS: 11 days A FACE TO FACE EVALUATION WAS PERFORMED  [name] 05/01/2020, 7:56 AM

## 2020-05-02 ENCOUNTER — Inpatient Hospital Stay (HOSPITAL_COMMUNITY): Payer: Medicaid Other | Admitting: Speech Pathology

## 2020-05-02 ENCOUNTER — Inpatient Hospital Stay (HOSPITAL_COMMUNITY): Payer: Medicaid Other | Admitting: Physical Therapy

## 2020-05-02 ENCOUNTER — Inpatient Hospital Stay (HOSPITAL_COMMUNITY): Payer: Medicaid Other | Admitting: Occupational Therapy

## 2020-05-02 LAB — GLUCOSE, CAPILLARY
Glucose-Capillary: 141 mg/dL — ABNORMAL HIGH (ref 70–99)
Glucose-Capillary: 152 mg/dL — ABNORMAL HIGH (ref 70–99)
Glucose-Capillary: 122 mg/dL — ABNORMAL HIGH (ref 70–99)
Glucose-Capillary: 141 mg/dL — ABNORMAL HIGH (ref 70–99)

## 2020-05-02 MED ORDER — HYDRALAZINE HCL 10 MG PO TABS
10.0000 mg | ORAL_TABLET | Freq: Four times a day (QID) | ORAL | Status: DC
  Administered 2020-05-02 – 2020-05-04 (×8): 10 mg via ORAL
  Filled 2020-05-02 (×8): qty 1

## 2020-05-02 NOTE — Progress Notes (Signed)
[place] PHYSICAL MEDICINE & REHABILITATION PROGRESS NOTE   Subjective/Complaints:  Remains dysarthric , appetite is good, impulsive in terms of feeding rate and amts, needs sup   ROS: Patient denies CP, SOB, N/V/D  Objective:   No results found. No results for input(s): WBC, HGB, HCT, PLT in the last 72 hours. Recent Labs    05/01/20 0605  NA 139  K 3.9  CL 101  CO2 26  GLUCOSE 131*  BUN 20  CREATININE 1.32*  CALCIUM 9.3    Intake/Output Summary (Last 24 hours) at 05/02/2020 0742 Last data filed at 05/02/2020 0654 Gross per 24 hour  Intake 716 ml  Output 1050 ml  Net -334 ml     Physical Exam: Vital Signs Blood pressure (!) 138/101, pulse 84, temperature 98.1 F (36.7 C), resp. rate 17, height 5\' 11"  (1.803 m), weight 101 kg, SpO2 100 %.   General: No acute distress Mood and affect are appropriate Heart: Regular rate and rhythm no rubs murmurs or extra sounds Lungs: Clear to auscultation, breathing unlabored, no rales or wheezes Abdomen: Positive bowel sounds, soft nontender to palpation, nondistended Extremities: No clubbing, cyanosis, or edema Skin: No evidence of breakdown, no evidence of rash  Musc: mild pain with PROM LLE, no focal knee abnl today RUE: 3/5 proximal to distal, unchanged, RLE: HF, KE 3-/4 ADF 0/5, unchanged Dysarthria significant Right facial weakness  Assessment/Plan: 1. Functional deficits secondary to Left pontine infarct which require 3+ hours per day of interdisciplinary therapy in a comprehensive inpatient rehab setting.  Physiatrist is providing close team supervision and 24 hour management of active medical problems listed below.  Physiatrist and rehab team continue to assess barriers to discharge/monitor patient progress toward functional and medical goals  Care Tool:  Bathing    Body parts bathed by patient: Right arm, Left arm, Chest, Abdomen, Front perineal area, Right upper leg, Left upper leg, Face, Left lower leg    Body parts bathed by helper: Buttocks, Right lower leg Body parts n/a: Right lower leg, Left lower leg, Buttocks (did not attempt this session)   Bathing assist Assist Level: Maximal Assistance - Patient 24 - 49%     Upper Body Dressing/Undressing Upper body dressing   What is the patient wearing?: Pull over shirt    Upper body assist Assist Level: Moderate Assistance - Patient 50 - 74%    Lower Body Dressing/Undressing Lower body dressing      What is the patient wearing?: Incontinence brief, Pants     Lower body assist Assist for lower body dressing: Maximal Assistance - Patient 25 - 49%     Toileting Toileting    Toileting assist Assist for toileting: Maximal Assistance - Patient 25 - 49%     Transfers Chair/bed transfer  Transfers assist     Chair/bed transfer assist level: Moderate Assistance - Patient 50 - 74%     Locomotion Ambulation   Ambulation assist   Ambulation activity did not occur: Safety/medical concerns          Walk 10 feet activity   Assist  Walk 10 feet activity did not occur: Safety/medical concerns        Walk 50 feet activity   Assist Walk 50 feet with 2 turns activity did not occur: Safety/medical concerns         Walk 150 feet activity   Assist Walk 150 feet activity did not occur: Safety/medical concerns         Walk 10 feet on  uneven surface  activity   Assist Walk 10 feet on uneven surfaces activity did not occur: Safety/medical concerns         Wheelchair     Assist Will patient use wheelchair at discharge?: Yes Type of Wheelchair: Manual    Wheelchair assist level: Minimal Assistance - Patient > 75% Max wheelchair distance: 50    Wheelchair 50 feet with 2 turns activity    Assist        Assist Level: Minimal Assistance - Patient > 75%   Wheelchair 150 feet activity     Assist          Blood pressure (!) 138/101, pulse 84, temperature 98.1 F (36.7 C), resp. rate  17, height 5\' 11"  (1.803 m), weight 101 kg, SpO2 100 %.  Medical Problem List and Plan: 1.  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  Recommendations a 30-day cardiac event monitor as outpatient  Continue CIR PT, OT SLP  PRAFO ordered 2.  Antithrombotics: -DVT/anticoagulation: Lovenox             -antiplatelet therapy: Aspirin 325 mg and Plavix 75 mg daily x3 months then Plavix alone 3. Pain Management: Voltaren gel 4 times daily, Neurontin 300 mg 3 times daily  Controlled on 6/10  -LLE pain chronic with normal L hip MRI 2019, ? CVA related vs sciatic, increase gabapentin,  lumbar films show disc height loss L5-S1, no spondylolisthesis. Gabapentin increased to 600mg  tid 6/11   Recent increase in gabapentin helpful---continue  4. Mood: Cymbalta 30 mg twice daily             -antipsychotic agents: Invega 3 mg daily 5. Neuropsych: This patient is capable of making decisions on his own behalf. 6. Skin/Wound Care: Routine skin checks 7. Fluids/Electrolytes/Nutrition: Routine in and outs with follow-up chemistries 8.  Diastolic congestive heart failure.  Monitor for any signs of fluid overload Filed Weights   04/30/20 0500 05/01/20 0500 05/02/20 0500  Weight: 100.6 kg 102.1 kg 101 kg   Baseline appear to be ~98kg----up to 100kg 6/13--does not appear volume overloaded, I/O's negative 145cc yesterday   -follow for trend, accuracy of weights  9.  Diabetes mellitus with peripheral neuropathy.  Hemoglobin A1c 7.7.  SSI.    Glucophage 500 mg twice daily prior to admission, resumed on 6/4 CBG (last 3)  Recent Labs    05/01/20 1629 05/01/20 2056 05/02/20 0600  GLUCAP 118* 135* 141*  reasonable  Control 6/15 10.  Hypertension.  Patient on Coreg 25 mg twice daily, Norvasc 10 mg daily, hydralazine 25 mg 3 times daily, Cozaar 50 mg daily  prior to admission.  Resume as needed Vitals:   05/01/20 1654 05/01/20 1945  BP: (!) 154/108 (!) 138/101  Pulse: 80 84  Resp: 20 17  Temp: 98 F (36.7 C) 98.1 F (36.7 C)  SpO2: 100% 100%   Amlodipine 5 started on 6/4, increase to 10 on 6/7, on Coreg 25mg  BID   Cozaar 25mg  need to increase to 50mg  , add hydralazine if this is not helpful  6/12  Continued elevation despite above   -added hydralazine 10mg  tid  2/59 mild systolic and diastolic creeping up increase hydralazine to QID  11.  Hyperlipidemia.  Lipitor 12.  History of polysubstance tobacco and alcohol abuse.  Urine drug screen negative.  Provide counseling 13.  Post stroke dysphagia  D2, nectars, advance diet as tolerated   -BUN nl clinically appears adequately hydrated  LOS: 12 days A FACE TO FACE EVALUATION WAS PERFORMED  Charlett Blake 05/02/2020, 7:42 AM

## 2020-05-02 NOTE — Plan of Care (Signed)
°  Problem: Consults Goal: RH STROKE PATIENT EDUCATION Description: See Patient Education module for education specifics  Outcome: Progressing Goal: Nutrition Consult-if indicated Description: Monitor intake with Min I assist for possible consult if needed Outcome: Progressing Goal: Diabetes Guidelines if Diabetic/Glucose > 140 Description: If diabetic or lab glucose is > 140 mg/dl - Initiate Diabetes/Hyperglycemia Guidelines & Document Interventions per MD orders Outcome: Progressing   Problem: RH BOWEL ELIMINATION Goal: RH STG MANAGE BOWEL WITH ASSISTANCE Description: STG Manage Bowel with Assistance Mod I assist  Outcome: Progressing   Problem: RH BLADDER ELIMINATION Goal: RH STG MANAGE BLADDER WITH ASSISTANCE Description: STG Manage Bladder With Assistance with Mod I assist Outcome: Progressing   Problem: RH SKIN INTEGRITY Goal: RH STG SKIN FREE OF INFECTION/BREAKDOWN Description: Skin to remain free of break down during rehab stay with min I assist Outcome: Progressing Goal: RH STG MAINTAIN SKIN INTEGRITY WITH ASSISTANCE Description: STG Maintain Skin Integrity With Assistance with Min I assist Outcome: Progressing   Problem: RH SAFETY Goal: RH STG ADHERE TO SAFETY PRECAUTIONS W/ASSISTANCE/DEVICE Description: STG Adhere to Safety Precautions With Assistance/Device Mod I assist with walker Outcome: Progressing   Problem: RH PAIN MANAGEMENT Goal: RH STG PAIN MANAGED AT OR BELOW [name] PAIN GOAL Description: Pain goal of 3 for 0-10 pain scale Outcome: Progressing

## 2020-05-02 NOTE — Progress Notes (Signed)
Speech Language Pathology Daily Session Note  Patient Details  Name: [name] MRN: [id number] Date of Birth: June 14, 1961  Today's Date: 05/02/2020 SLP Individual Time: 2683-4196 SLP Individual Time Calculation (min): 56 min  Short Term Goals: Week 2: SLP Short Term Goal 1 (Week 2): Pt will use swallow strategies to achieve efficient mastication and oral clearance of Dys 2 solid texture diet with Supervision A verbal cues for use of strategies. SLP Short Term Goal 2 (Week 2): Pt will consume therapeutic trials of thin liquids per water protocol with minimal overt s/sx aspiration over 3 consectutive sessions prior to upgrade. SLP Short Term Goal 3 (Week 2): Pt will utilize slow rate, overarticulation, and increased vocal intensity to achieve intelligibility at the word level with mod assist verbal cues. SLP Short Term Goal 4 (Week 2): Pt will recall daily information with min cues for use of external aids. SLP Short Term Goal 5 (Week 2): Pt will detect functional errors with Min verbal/visual cues. SLP Short Term Goal 6 (Week 2): Pt will sustain attention to functional tasks with Min A cues for redirection.  Skilled Therapeutic Interventions: Pt was seen for skilled ST targeting dysphagia and cognition. Pt agreeable to trials of thin H2O in accordance with water protocol. Pt performed oral care with suction toothbrush after set up assist. He exhibited immediate cough X1 on first sip of thin H2O, however no additional overt s/sx aspiration observed throughout intake of ~6 oz. Recommend continue current diet with water protocol in place. Pt also completed 3 sets of 10 EMST exercises with Philips device set to 14 cm H2O resistance with only Supervision A verbal cues for accuracy and tracking number of reps. Self perceived level of difficulty rating was 5 out of 10. Therefore, SLP increased resistance to 16 cm H2O. Pt performed 10 additional exercises with device set at this level with self  perceived difficulty rating of 7-8 out of 10. Recommend keeping device set to new 16 cm H2O resistance level. During a basic 3-step action card sequencing task, pt required Min A verbal and visual cues for problem solving and Supervision A for error awareness. He demonstrated ability to detect and self correct error X1. When describing the action cards, pt continues to required Moderate cues for intentional implementation of strategies to increase intelligibility (with a focus on slower rate and increased intensity). Pt also continues to require Mod A verbal cues for redirection, due to internal distractions and difficulty with topic maintenance. Pt left sitting in chair with alarm set and needs within reach. Continue per current plan of care.        Pain Pain Assessment Pain Scale: 0-10 Pain Score: 0-No pain   Therapy/Group: Individual Therapy  Arbutus Leas 05/02/2020, 7:07 AM

## 2020-05-02 NOTE — Progress Notes (Signed)
Physical Therapy Session Note  Patient Details  Name: [name] MRN: [id number] Date of Birth: 15-Dec-1960  Today's Date: 05/02/2020 PT Individual Time: 1351-1501 PT Individual Time Calculation (min): 70 min   Short Term Goals: Week 2:  PT Short Term Goal 1 (Week 2): Pt will perform supine<>sit with mod assist consistently without bed features PT Short Term Goal 2 (Week 2): Pt will perform bed<>chair transfers consistently with mod assist of 1 PT Short Term Goal 3 (Week 2): Pt will perform sit<>stands consistently with +2 mod assist PT Short Term Goal 4 (Week 2): Pt will initiate gait training  Skilled Therapeutic Interventions/Progress Updates:    Pt received supine, asleep in bed but with minimal encouragement agreeable to therapy session. Pt remained lethargic at beginning but with increased activity became more awake. Supine>sitting L EOB with mod assist for trunk upright. Sitting EOB required 2x mod assist to recover from posterior LOB due to being sleepy despite L UE support on bedrail. L squat pivot to w/c with mod assist for lifting/pivoting hips.  Transported to/from gym in w/c for time management and energy conservation. L squat pivot to EOM with mod assist for lifting/pivoting hips and cuing for L UE position to decrease pushing. Sitting EOM with red wedge under R hip to promote level pelvic alignment (due to pushing pt's L side often elevated) and using mirror feedback for improved midline awareness while performing L lateral trunk lean onto forearm support with cross body reaching with R UE to grasp horseshoe then return to midline and reach R to place on external target with min assist and manual facilitation for R LE WBing. Assessed vitals: BP 159/117 (MAP 130), HR 93bpm Caryl Pina, RN present and aware. L squat pivot to w/c with mod assist. Sit>stands at L hallway rail with mod assist for lifting and mirror feedback for midline orientation and cuing to bring L hip to the wall  to decrease pushing while pt grasped horseshoes from top of mirror to promote increased upright trunk posture and decrease L UE pushing - mod assist for balance. Gait training at L hallway rail 78ft, 28ft, 11ft with max assist of 1 and +2 w/c follow - pt able to progress R LE during swing requiring mod progressed to min assist for improved placement due to excessive adduction and max assist to block R knee buckle/hyperextension during weight bearing - cuing throughout for advancement of L UE on rail and for L lateral weight shift bringing hip to wall during L LE stance to decrease pushing. Assessed BP after ambulation: BP 152/105 (MAP 119), HR 82bpm Pt reporting fatigue after ambulation therefore transported back to room. L squat pivot to EOB with pt using L UE support on bedrail with min assist for pivoting hips. Sit>supine with min assist for LE management into the bed. Pt left supine in bed with needs in reach and bed alarm on.  Therapy Documentation Precautions:  Precautions Precautions: Fall Precaution Comments: pusher, right hemiparesis Restrictions Weight Bearing Restrictions: No  Pain: Continues to report L hip pain requiring frequent seated rest breaks and cuing for changing positions to decrease pushing - RN notified and present for medication administration.   Therapy/Group: Individual Therapy  Tawana Scale, PT, DPT 05/02/2020, 1:49 PM

## 2020-05-02 NOTE — Progress Notes (Signed)
Occupational Therapy Session Note  Patient Details  Name: [name] MRN: [id number] Date of Birth: 1961/11/03  Today's Date: 05/02/2020 OT Individual Time: 1937-9024 OT Individual Time Calculation (min): 74 min    Short Term Goals: Week 2:  OT Short Term Goal 1 (Week 2): Pt will complete squat pivot transfer to the drop arm commode with mod assist. OT Short Term Goal 2 (Week 2): Pt will donn brief and pants with mod assist sit to stand for two consecutive sessions. OT Short Term Goal 3 (Week 2): Pt will complete UB dressing with supervision for 2 consecutive sessions. OT Short Term Goal 4 (Week 2): Pt will perform walk-in shower transfer with mod assist stand pivot.  Skilled Therapeutic Interventions/Progress Updates:    Pt completed supine to sit EOB with mod assist to start.  He was able to complete donning the pullover shirt with mod assist.  He then completed donning of pull up pants with max assist sit to stand from the EOB.  One LOB to the right noted when attempting to cross the RLE over the left knee for donning his sock.  Increased hip and trunk flexion in standing with increased right pushing noted in standing.  He then completed squat pivot transfer to the wheelchair with mod assist and was taken to the therapy gym.  Had him work on sitting to start with wedge placed under his right hip in order to increase weightbearing over to the left side.  Incorporated functional reaching to the left with transitions to squat position at times to help work on decreasing pushing tendencies to the right.  He demonstrated the need for mod to max assist to complete squat position while reaching to the left.  Mod assist when reaching to midline with increased lean to the right still noted.  Finished session with sit to stand at the wall on the left side.  Had him stand and weightshift to the wall on the left to decreased pushing.  Mod assist to complete, but pt unable to sustain standing longer  than 30 seconds secondary to increased left hip pain.  Finished session with return to the room and pt in the wheelchair with call button and phone in reach.  Safety alarm belt in place as well.   Therapy Documentation Precautions:  Precautions Precautions: Fall Precaution Comments: pusher, right hemiparesis Restrictions Weight Bearing Restrictions: No  Pain: Pain Assessment Pain Scale: Faces Faces Pain Scale: Hurts little more Pain Type: Acute pain Pain Location: Hip Pain Orientation: Left Pain Descriptors / Indicators: Discomfort;Nagging Pain Onset: With Activity ADL: See Care Tool Section for some details of mobility and selfcare  Therapy/Group: Individual Therapy  Airrion Otting OTR/L 05/02/2020, 12:09 PM

## 2020-05-03 ENCOUNTER — Inpatient Hospital Stay (HOSPITAL_COMMUNITY): Payer: Medicaid Other | Admitting: Speech Pathology

## 2020-05-03 ENCOUNTER — Inpatient Hospital Stay (HOSPITAL_COMMUNITY): Payer: Medicaid Other | Admitting: Physical Therapy

## 2020-05-03 ENCOUNTER — Inpatient Hospital Stay (HOSPITAL_COMMUNITY): Payer: Medicaid Other | Admitting: Occupational Therapy

## 2020-05-03 LAB — GLUCOSE, CAPILLARY
Glucose-Capillary: 134 mg/dL — ABNORMAL HIGH (ref 70–99)
Glucose-Capillary: 128 mg/dL — ABNORMAL HIGH (ref 70–99)
Glucose-Capillary: 123 mg/dL — ABNORMAL HIGH (ref 70–99)
Glucose-Capillary: 117 mg/dL — ABNORMAL HIGH (ref 70–99)

## 2020-05-03 NOTE — Progress Notes (Signed)
Physical Therapy Session Note  Patient Details  Name: [name] MRN: [id number] Date of Birth: 1961-08-10  Today's Date: 05/03/2020 PT Individual Time: 4496-7591 PT Individual Time Calculation (min): 29 min   Short Term Goals: Week 2:  PT Short Term Goal 1 (Week 2): Pt will perform supine<>sit with mod assist consistently without bed features PT Short Term Goal 2 (Week 2): Pt will perform bed<>chair transfers consistently with mod assist of 1 PT Short Term Goal 3 (Week 2): Pt will perform sit<>stands consistently with +2 mod assist PT Short Term Goal 4 (Week 2): Pt will initiate gait training  Skilled Therapeutic Interventions/Progress Updates:    Pt received supine in bed and agreeable to therapy session. Supine>sitting L EOB, HOB flat but using bedrails, with supervision and increased time. Sitting EOB with supervision using L UE support on bedrail as needed while donning pants with max assist. Sit>stand from EOB with heavy mod assist of 1 and +2 min assist to pull pants over hips with max assist. R squat pivot to w/c with mod assist for lifting/pivoting hips.  Transported to/from gym in w/c for time management and energy conservation. Sit>stand w/c>L UE support on mat table with mat on L side as external target to promote L weight shifting with mirror feedback to decrease pushing - mod assist for lifting into standing with therapist facilitating R LE WBing and hip/knee extension. In standing performed: 2 sets of R LE foot taps on/off 4" step with mod assist for balance and min assist for R LE management (cuing throughout for L weight shift and upright trunk posture), 1 set of L LE foot taps on/off 4" step with mod assist for balance and max assist for R LE knee control to prevent buckling and hyperextension. Transported back to room and left seated in w/c with needs in reach and seat belt alarm on.   Therapy Documentation Precautions:  Precautions Precautions: Fall Precaution  Comments: pusher, right hemiparesis Restrictions Weight Bearing Restrictions: No  Pain:   Continues to report L hip pain that becomes more intense during standing as pushing worsens - RN provided medication at beginning of session and therapist provided seated rest breaks for pain management.   Therapy/Group: Individual Therapy  Tawana Scale, PT, DPT 05/03/2020, 7:49 AM

## 2020-05-03 NOTE — Patient Care Conference (Signed)
Inpatient RehabilitationTeam Conference and Plan of Care Update Date: 05/03/2020   Time: 1:46 PM    Patient Name: [name]      Medical Record Number: [id number]  Date of Birth: December 28, 1960 Sex: Male         Room/Bed: 4W08C/4W08C-01 Payor Info: Payor: MEDICAID [place] / Plan: MEDICAID [place] ACCESS / Product Type: *No Product type* /    Admit Date/Time:  04/20/2020  3:04 PM  Primary Diagnosis:  Left pontine cerebrovascular accident [facility])  Patient Active Problem List   Diagnosis Date Noted  . Chronic diastolic congestive heart failure ([place])   . Dysphagia, post-stroke   . 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[place]) 04/20/2020  . CVA (cerebral vascular accident) ([place]) 04/18/2020  . Right sided weakness 04/17/2020  . History of CVA with residual deficit 04/17/2020  . Chronic combined systolic (congestive) and diastolic (congestive) heart failure ([place]) 04/17/2020  . Substance use disorder 04/17/2020  . Abnormal EKG 02/07/2020  . Left hip pain 02/07/2020  . AKI (acute kidney injury) ([place]) 02/07/2020  . Systolic heart failure secondary to hypertension ([place])   . Schizophrenia ([place])   . Acute systolic heart failure ([place]) 02/02/2018  . Chest pain 01/31/2018  . Hypokalemia   . Schizoaffective disorder, bipolar type ([place]) 11/22/2017  . Trauma   . Tachycardia 03/03/2017  . Alcohol abuse   . Cocaine abuse ([place])   . Hypertensive urgency   . Tachycardia   . Cocaine abuse with cocaine-induced mood disorder ([place]) 12/05/2016  . Cerebrovascular disease 09/02/2015  . 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 Malignant hypertension 08/30/2015  . Alcohol use disorder, severe, dependence ([place]) 08/30/2015  . Alcohol withdrawal ([place]) 08/30/2015  . Alcohol abuse with alcohol-induced mood disorder ([place]) 08/30/2015  . Thrombocytopenia ([place]) 08/27/2015  . Tobacco use disorder 08/26/2015  . Hyperlipidemia associated with type 2 diabetes mellitus  ([place]) 04/28/2015  . Schizoaffective disorder, depressive type ([place])   . Hypertension associated with diabetes ([place]) 08/09/2013  . Gout 08/09/2013  . Diabetes mellitus ([place]) 07/09/2012    Expected Discharge Date: Expected Discharge Date: 05/16/20  Team Members Present: Physician leading conference: Dr. Alysia Penna Care Coodinator Present: Nestor Lewandowsky, RN, BSN, CRRN;Christina Sampson Goon, BSW Nurse Present: Other (comment) Renda Rolls, LPN) PT Present: Page Spiro, PT OT Present: Clyda Greener, OT SLP Present: Jettie Booze, CF-SLP PPS Coordinator present : Ileana Ladd, PT     Current Status/Progress Goal Weekly Team Focus  Bowel/Bladder   continent of bladder. incontinent of bowel LBM 6/14  Pt will be continent and will not need bladder intervention.  assess toileting needs qshift and PRN   Swallow/Nutrition/ Hydration   Dys 2, nectar, water protocol but does not prefer to do very many trials of thin  Mod I  tolerance current diet, trials advanced solids, water protocol, independence with use strategies   ADL's   Supervison for UB bathing with mod assist for UB dressing.  He needs max assist for LB bathing and dressing sit to stand.  Transfers are mod to max assist squat or stand pivot.  Min A overall, Mod A LB dressing  selfcare retraining, balance retraining, transfer training, DME education, RUE neuromuscular re-education,   Mobility   mod assist supine<>sit, +2 mod assist for sit<>stands without rail/AD, mod assist of 1 for squat pivot transfers, gait 31ft at L hallway rail with max assist of 1 and +2 w/c follow  min assist overall  bed mobility, transfer training, activity tolerance, R LE NMR, midline orientation/decreasd pushing,  trunk control, pt education, gait training   Communication   Mod A use of strategies for intelligibility, intelligibility fluctuates with fatigue and use of strategies  Min A  carryover speech strategies functional phrase/sentence level    Safety/Cognition/ Behavioral Observations  Min-Mod  Supervision  sustained attention, recall, basic problem solving, emergent awareness   Pain   c/o of left hip pain controlled with PRN tylenol and muscle cream  [name] pain level with remain less than 3  assess apin qshift and PRN   Skin   skin intact  [name] skin will remain intact.  assess skin qshift and PRN    Rehab Goals Patient on target to meet rehab goals: Yes Rehab Goals Revised: Patient on target with current goals *See Care Plan and progress notes for long and short-term goals.     Barriers to Discharge  Current Status/Progress Possible Resolutions Date Resolved   Nursing                  PT  Decreased caregiver support;Home environment access/layout;Medical stability;Lack of/limited family support;Neurogenic Bowel & Bladder                 OT                  SLP                Care Coordinator Lack of/limited family support;Medical stability   on target with current goals          Discharge Planning/Teaching Needs:  Patient plans to discharge home with daughter to provide care  Will schedule if needed   Team Discussion:  Left leg pain improved with meds. HTN meds being adjusted. Pushing, hip pain, knee buckling and requirement for constant cues impairing progress. Patient has poor tolerance level and easily frustrated. Progress is slow but has good function in right hand and arm. Still needs help to self monitor for speech. Continue with min assist goals.  Revisions to Treatment Plan:      Medical Summary Current Status: uncontrolled HTN, starting to ambulate Weekly Focus/Goal: BP med adjustment  Barriers to Discharge: Medical stability   Possible Resolutions to Barriers: med management as above   Continued Need for Acute Rehabilitation Level of Care: The patient requires daily medical management by a physician with specialized training in physical medicine and rehabilitation for the following  reasons: Direction of a multidisciplinary physical rehabilitation program to maximize functional independence : Yes Medical management of patient stability for increased activity during participation in an intensive rehabilitation regime.: Yes Analysis of laboratory values and/or radiology reports with any subsequent need for medication adjustment and/or medical intervention. : Yes   I attest that I was present, lead the team conference, and concur with the assessment and plan of the team.   Dorien Chihuahua B 05/03/2020, 1:46 PM

## 2020-05-03 NOTE — Progress Notes (Signed)
Occupational Therapy Session Note  Patient Details  Name: [name] MRN: [id number] Date of Birth: 24-Jul-1961  Today's Date: 05/03/2020 OT Individual Time: 4098-1191 OT Individual Time Calculation (min): 42 min    Short Term Goals: Week 2:  OT Short Term Goal 1 (Week 2): Pt will complete squat pivot transfer to the drop arm commode with mod assist. OT Short Term Goal 2 (Week 2): Pt will donn brief and pants with mod assist sit to stand for two consecutive sessions. OT Short Term Goal 3 (Week 2): Pt will complete UB dressing with supervision for 2 consecutive sessions. OT Short Term Goal 4 (Week 2): Pt will perform walk-in shower transfer with mod assist stand pivot.  Skilled Therapeutic Interventions/Progress Updates:     Pt completed shower and dressing during session.  Charlaine Dalton was used for transfer into and out of the shower secondary to pusher tendencies.  Pt still exhibits increased pushing to the right side in sitting during bathing, but no LOB is noted.  He completed all bathing in sitting with overall mod assist.  He then transferred out to the wheelchair at the sink via [place] for dressing.  He donned a pullover shirt with setup but needed mod assist sit to stand for donning brief and pants.  Therapist completed donning pants and then he was able to complete donning gripper socks with min assist.  Min facilitation is needed for crossing the RLE over the left knee during bathing and dressing tasks.  Decreased weightshift to the left was noted in standing and in sitting.  Finished session with pt staying up in the wheelchair with call button and phone in reach and safety belt in place.    Therapy Documentation Precautions:  Precautions Precautions: Fall Precaution Comments: pusher, right hemiparesis Restrictions Weight Bearing Restrictions: No  Pain: Pain Assessment Pain Scale: 0-10 Pain Score: 0-No pain Faces Pain Scale: Hurts little more Pain Type: Acute  pain;Neuropathic pain Pain Location: Hip Pain Orientation: Left Pain Descriptors / Indicators: Discomfort Pain Onset: With Activity Pain Intervention(s): Repositioned;Emotional support ADL: See Care Tool Section for some details of mobility and selfcare  Therapy/Group: Individual Therapy  Sivan Cuello OTR/L 05/03/2020, 10:48 AM

## 2020-05-03 NOTE — Progress Notes (Signed)
Physical Therapy Session Note  Patient Details  Name: [name] MRN: [id number] Date of Birth: [birth year]  Today's Date: 05/03/2020 PT Individual Time: 1118-1200 PT Individual Time Calculation (min): 42 min   Short Term Goals: Week 2:  PT Short Term Goal 1 (Week 2): Pt will perform supine<>sit with mod assist consistently without bed features PT Short Term Goal 2 (Week 2): Pt will perform bed<>chair transfers consistently with mod assist of 1 PT Short Term Goal 3 (Week 2): Pt will perform sit<>stands consistently with +2 mod assist PT Short Term Goal 4 (Week 2): Pt will initiate gait training  Skilled Therapeutic Interventions/Progress Updates: Pt presents sitting in w/c w/ c/o pain left glute but agreeable to therapy.  Pt wheeled to gym for energy and time conservation.  Pt performed multiple squat pivot to left w/c <> mat table, > bed w/ mod A.  Pt requires verbal and minimal manual assist for right LE placement as well as forward scoot and lean to initiate.  Pt performed seated reaching and playing Connect 4 game w/ use of RUE to reach up.  Pt sitting midline w/ UEs on knees w/o lean.   Pt c/o increased pain left glut/thigh.  Returned to room and taken into BR for sit to stand w/ min A at arm rail to apply Cream to left glut for pain management after consult w/ nursing.  Pt amb forward and back 5' w/ RW and mod A w/ lean to right, but able to correct partially w/ verbal cues.  Verbal cues for improved right LE advancement and placement to maintain BOS.  Pt performed squat pivot w/c > bed w/ mod A and min A for sit to supine.  Bed alarm on and all needs in reach.     Therapy Documentation Precautions:  Precautions Precautions: Fall Precaution Comments: pusher, right hemiparesis Restrictions Weight Bearing Restrictions: No General:   Vital Signs:  Pain:10/10 left hip, decreased to 9/15 after application of Diclofenac and muscle rub. Pain Assessment Pain Scale: 0-10 Pain  Score: 0-No pain Faces Pain Scale: Hurts little more Pain Type: Acute pain;Neuropathic pain Pain Location: Hip Pain Orientation: Left Pain Descriptors / Indicators: Discomfort Pain Onset: With Activity Pain Intervention(s): Repositioned;Emotional support Mobility:      Therapy/Group: Individual Therapy  Ladoris Gene 05/03/2020, 12:28 PM

## 2020-05-03 NOTE — Progress Notes (Signed)
Speech Language Pathology Daily Session Note  Patient Details  Name: [name] MRN: [id number] Date of Birth: [birth year]  Today's Date: 05/03/2020 SLP Individual Time: 4098-1191 SLP Individual Time Calculation (min): 45 min  Short Term Goals: Week 2: SLP Short Term Goal 1 (Week 2): Pt will use swallow strategies to achieve efficient mastication and oral clearance of Dys 2 solid texture diet with Supervision A verbal cues for use of strategies. SLP Short Term Goal 2 (Week 2): Pt will consume therapeutic trials of thin liquids per water protocol with minimal overt s/sx aspiration over 3 consectutive sessions prior to upgrade. SLP Short Term Goal 3 (Week 2): Pt will utilize slow rate, overarticulation, and increased vocal intensity to achieve intelligibility at the word level with mod assist verbal cues. SLP Short Term Goal 4 (Week 2): Pt will recall daily information with min cues for use of external aids. SLP Short Term Goal 5 (Week 2): Pt will detect functional errors with Min verbal/visual cues. SLP Short Term Goal 6 (Week 2): Pt will sustain attention to functional tasks with Min A cues for redirection.  Skilled Therapeutic Interventions: Pt was seen for skilled ST targeting dysphagia and cognitive-linguistic goals. SLP facilitated session with skilled observation of pt consuming dys 2 (minced/ground) and puree breakfast solids from his breakfast tray, along with nectar thick liquids. [name] mastication and oral clearance was efficient throughout intake, and no overt s/sx aspiration were observed across these textures. He used swallow precautions with Supervision A verbal cues today. However, during upgraded trial of dys 3 (mech soft) solid, pt exhibited prolonged mastication and immediate cough X1. Recommend continue current diet. During dys 3 trial, pt expressed need to void, and he was continent of urine with use of urinal, however Moderate multimodal cues were required for sequencing  and sustained attention to functional task. SLP further facilitated session with sentence level speech tasks containing 3-syllable words, during which pt was 75% intelligible with Min A verbal cues for clarification with use of speech strategies. During more informal communication exchanges, pt's intelligibility is further reduced and requires more frequent cueing for use of strategies due to decreased awareness and self-monitoring.  Pt also completed 3 sets of 10 EMST exercises with device set at 16 cm H2O resistance and overall Min A verbal cues for accuracy in use of device. [name] self-perceived difficulty rating was 6/10, however based on SLP's observations and cueing required, would recommend pt's device stay set to current level (16cm H2O). Pt left laying in bed with alarm set and needs within reach.      Pain Pain Assessment Pain Scale: 0-10 Pain Score: 0-No pain  Therapy/Group: Individual Therapy  Arbutus Leas 05/03/2020, 7:06 AM

## 2020-05-03 NOTE — Progress Notes (Signed)
Patient ID: [name], male   DOB: 05-26-1961, 58 y.o.   MRN: [id number]  Team Conference Report to Patient/Family  Team Conference discussion was reviewed with the patient and caregiver, including goals, any changes in plan of care and target discharge date.  Patient and caregiver express understanding and are in agreement.  The patient has a target discharge date of 05/16/20.  Dyanne Iha 05/03/2020, 2:19 PM

## 2020-05-03 NOTE — Progress Notes (Signed)
Physical Therapy Session Note  Patient Details  Name: [name] MRN: [id number] Date of Birth: [birth year]  Today's Date: 05/03/2020 PT Individual Time: 1515-1600 PT Individual Time Calculation (min): 45 min   Short Term Goals: Week 2:  PT Short Term Goal 1 (Week 2): Pt will perform supine<>sit with mod assist consistently without bed features PT Short Term Goal 2 (Week 2): Pt will perform bed<>chair transfers consistently with mod assist of 1 PT Short Term Goal 3 (Week 2): Pt will perform sit<>stands consistently with +2 mod assist PT Short Term Goal 4 (Week 2): Pt will initiate gait training  Skilled Therapeutic Interventions/Progress Updates:   Pt received supine in bed and agreeable to PT. Supine>sit transfer with min assist to prevent posterior LOB min assist for sitting balance progressing to supervision assist.    Transfers with squat pivot technique and mod assist throughout treatment to and from various surfaces with LLE blocked and moderate cues for safety and set up.   Standing tolerance in parallel bars 3x15 sec with mod assist to transfer into standing. Pt reports pain in L hip preventing increased time in standing.  Nustep reciprocal movement training x 5 min with mod assist to assist pt to sustain neutral hip abduction/adduction. Level 3.   Pt returned to room and performed squat pivot transfer to bed as list ed above. Sit>supine completed with min assist for LE management., and left supine in bed with call bell in reach and all needs met.       Therapy Documentation Precautions:  Precautions Precautions: Fall Precaution Comments: pusher, right hemiparesis Restrictions Weight Bearing Restrictions: No    Vital Signs: Therapy Vitals Temp: 98.2 F (36.8 C) Temp Source: Oral Pulse Rate: 78 Resp: 18 BP: (!) 138/98 Patient Position (if appropriate): Lying Oxygen Therapy SpO2: 100 % O2 Device: Room Air Pain: Faces: hurts a little more. Pt  repositioned    Therapy/Group: Individual Therapy  Lorie Phenix 05/03/2020, 3:58 PM

## 2020-05-03 NOTE — Progress Notes (Signed)
[facility] PHYSICAL MEDICINE & REHABILITATION PROGRESS NOTE   Subjective/Complaints:  Bright and alert this am   ROS: Patient denies CP, SOB, N/V/D  Objective:   No results found. No results for input(s): WBC, HGB, HCT, PLT in the last 72 hours. Recent Labs    05/01/20 0605  NA 139  K 3.9  CL 101  CO2 26  GLUCOSE 131*  BUN 20  CREATININE 1.32*  CALCIUM 9.3    Intake/Output Summary (Last 24 hours) at 05/03/2020 0900 Last data filed at 05/03/2020 0840 Gross per 24 hour  Intake 200 ml  Output 500 ml  Net -300 ml     Physical Exam: Vital Signs Blood pressure (!) 157/100, pulse 73, temperature 97.8 F (36.6 C), resp. rate 16, height '5\' 11"'  (1.803 m), weight 99 kg, SpO2 100 %.  General: No acute distress Mood and affect are appropriate Heart: Regular rate and rhythm no rubs murmurs or extra sounds Lungs: Clear to auscultation, breathing unlabored, no rales or wheezes Abdomen: Positive bowel sounds, soft nontender to palpation, nondistended Extremities: No clubbing, cyanosis, or edema Skin: No evidence of breakdown, no evidence of rash Musc: mild pain with PROM LLE, no focal knee abnl today RUE: 3/5 proximal to distal, unchanged, RLE: HF, KE 3-/4 ADF 0/5, unchanged Dysarthria significant Right facial weakness  Assessment/Plan: 1. Functional deficits secondary to Left pontine infarct which require 3+ hours per day of interdisciplinary therapy in a comprehensive inpatient rehab setting.  Physiatrist is providing close team supervision and 24 hour management of active medical problems listed below.  Physiatrist and rehab team continue to assess barriers to discharge/monitor patient progress toward functional and medical goals  Care Tool:  Bathing    Body parts bathed by patient: Right arm, Left arm, Chest, Abdomen, Front perineal area, Right upper leg, Left upper leg, Face, Left lower leg   Body parts bathed by helper: Buttocks, Right lower leg Body parts n/a:  Right lower leg, Left lower leg, Buttocks (did not attempt this session)   Bathing assist Assist Level: Maximal Assistance - Patient 24 - 49%     Upper Body Dressing/Undressing Upper body dressing   What is the patient wearing?: Pull over shirt    Upper body assist Assist Level: Moderate Assistance - Patient 50 - 74%    Lower Body Dressing/Undressing Lower body dressing      What is the patient wearing?: Pants     Lower body assist Assist for lower body dressing: Maximal Assistance - Patient 25 - 49%     Toileting Toileting    Toileting assist Assist for toileting: Maximal Assistance - Patient 25 - 49%     Transfers Chair/bed transfer  Transfers assist     Chair/bed transfer assist level: Moderate Assistance - Patient 50 - 74% (squat pivot)     Locomotion Ambulation   Ambulation assist   Ambulation activity did not occur: Safety/medical concerns  Assist level: 2 helpers (max A of 1 and +2 w/c follow) Assistive device: Other (comment) (L hallway rail) Max distance: 54f   Walk 10 feet activity   Assist  Walk 10 feet activity did not occur: Safety/medical concerns  Assist level: 2 helpers (max A of 1 and +2 w/c follow) Assistive device: Other (comment) (L hallway rail)   Walk 50 feet activity   Assist Walk 50 feet with 2 turns activity did not occur: Safety/medical concerns         Walk 150 feet activity   Assist Walk 150 feet  activity did not occur: Safety/medical concerns         Walk 10 feet on uneven surface  activity   Assist Walk 10 feet on uneven surfaces activity did not occur: Safety/medical concerns         Wheelchair     Assist Will patient use wheelchair at discharge?: Yes Type of Wheelchair: Manual    Wheelchair assist level: Minimal Assistance - Patient > 75% Max wheelchair distance: 50    Wheelchair 50 feet with 2 turns activity    Assist        Assist Level: Minimal Assistance - Patient > 75%    Wheelchair 150 feet activity     Assist          Blood pressure (!) 157/100, pulse 73, temperature 97.8 F (36.6 C), resp. rate 16, height '5\' 11"'  (1.803 m), weight 99 kg, SpO2 100 %.  Medical Problem List and Plan: 1.  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  Recommendations a 30-day cardiac event monitor as outpatient  Continue CIR PT, OT SLP Team conference today please see physician documentation under team conference tab, met with team  to discuss problems,progress, and goals. Formulized individual treatment plan based on medical history, underlying problem and comorbidities.  PT, OT SLP  [facility] ordered 2.  Antithrombotics: -DVT/anticoagulation: Lovenox             -antiplatelet therapy: Aspirin 325 mg and Plavix 75 mg daily x3 months then Plavix alone 3. Pain Management: Voltaren gel 4 times daily, Neurontin 300 mg 3 times daily  Controlled on 6/10  -LLE pain chronic with normal L hip MRI 2019, ? CVA related vs sciatic, increase gabapentin,  lumbar films show disc height loss L5-S1, no spondylolisthesis. Gabapentin increased to 619m tid 6/11   Recent increase in gabapentin helpful---continue  4. Mood: Cymbalta 30 mg twice daily             -antipsychotic agents: Invega 3 mg daily 5. Neuropsych: This patient is capable of making decisions on his own behalf. 6. Skin/Wound Care: Routine skin checks 7. Fluids/Electrolytes/Nutrition: Routine in and outs with follow-up chemistries 8.  Diastolic congestive heart failure.  Monitor for any signs of fluid overload Filed Weights   05/01/20 0500 05/02/20 0500 05/03/20 0454  Weight: 102.1 kg 101 kg 99 kg   Baseline appear to be ~98kg----up to 100kg 6/13--does not appear volume overloaded, I/O's negative 145cc yesterday   -follow for trend, accuracy of weights  9.   Diabetes mellitus with peripheral neuropathy.  Hemoglobin A1c 7.7.  SSI.    Glucophage 500 mg twice daily prior to admission, resumed on 6/4 CBG (last 3)  Recent Labs    05/02/20 1654 05/02/20 2055 05/03/20 0638  GLUCAP 122* 141* 134*  reasonable  Control 6/16 10.  Hypertension.  Patient on Coreg 25 mg twice daily, Norvasc 10 mg daily, hydralazine 25 mg 3 times daily, Cozaar 50 mg daily prior to admission.  Resume as needed Vitals:   05/02/20 2014 05/03/20 0454  BP: (!) 140/105 (!) 157/100  Pulse: 81 73  Resp: 20 16  Temp: 97.6 F (36.4 C) 97.8 F (36.6 C)  SpO2: 99% 100%   Amlodipine 5 started on 6/4, increase to 10 on 6/7, on Coreg 251mBID   Cozaar 2557meed to increase to 20m85madd hydralazine if this is not  helpful  6/12  Continued elevation despite above   -added hydralazine 81m tid  67/15mild systolic and diastolic creeping up increase hydralazine to QID , will wait another day to reassess change prior to any further adjustment  11.  Hyperlipidemia.  Lipitor 12.  History of polysubstance tobacco and alcohol abuse.  Urine drug screen negative.  Provide counseling 13.  Post stroke dysphagia  D2, nectars, advance diet as tolerated   -BUN nl clinically appears adequately hydrated  LOS: 13 days A FACE TO FACE EVALUATION WAS PERFORMED  ACharlett Blake6/16/2021, 9:00 AM

## 2020-05-03 NOTE — Plan of Care (Signed)
  Problem: Consults Goal: RH STROKE PATIENT EDUCATION Description: See Patient Education module for education specifics  Outcome: Progressing Goal: Nutrition Consult-if indicated Description: Monitor intake with Min I assist for possible consult if needed Outcome: Progressing Goal: Diabetes Guidelines if Diabetic/Glucose > 140 Description: If diabetic or lab glucose is > 140 mg/dl - Initiate Diabetes/Hyperglycemia Guidelines & Document Interventions per MD orders Outcome: Progressing   Problem: RH BOWEL ELIMINATION Goal: RH STG MANAGE BOWEL WITH ASSISTANCE Description: STG Manage Bowel with Assistance Mod I assist  Outcome: Progressing   Problem: RH BLADDER ELIMINATION Goal: RH STG MANAGE BLADDER WITH ASSISTANCE Description: STG Manage Bladder With Assistance with Mod I assist Outcome: Progressing   Problem: RH SKIN INTEGRITY Goal: RH STG SKIN FREE OF INFECTION/BREAKDOWN Description: Skin to remain free of break down during rehab stay with min I assist Outcome: Progressing Goal: RH STG MAINTAIN SKIN INTEGRITY WITH ASSISTANCE Description: STG Maintain Skin Integrity With Assistance with Min I assist Outcome: Progressing   Problem: RH SAFETY Goal: RH STG ADHERE TO SAFETY PRECAUTIONS W/ASSISTANCE/DEVICE Description: STG Adhere to Safety Precautions With Assistance/Device Mod I assist with walker Outcome: Progressing   Problem: RH PAIN MANAGEMENT Goal: RH STG PAIN MANAGED AT OR BELOW [name] PAIN GOAL Description: Pain goal of 3 for 0-10 pain scale Outcome: Progressing

## 2020-05-04 ENCOUNTER — Inpatient Hospital Stay (HOSPITAL_COMMUNITY): Payer: Medicaid Other | Admitting: Physical Therapy

## 2020-05-04 ENCOUNTER — Inpatient Hospital Stay (HOSPITAL_COMMUNITY): Payer: Medicaid Other | Admitting: Occupational Therapy

## 2020-05-04 ENCOUNTER — Inpatient Hospital Stay (HOSPITAL_COMMUNITY): Payer: Medicaid Other | Admitting: Speech Pathology

## 2020-05-04 LAB — GLUCOSE, CAPILLARY
Glucose-Capillary: 140 mg/dL — ABNORMAL HIGH (ref 70–99)
Glucose-Capillary: 130 mg/dL — ABNORMAL HIGH (ref 70–99)
Glucose-Capillary: 118 mg/dL — ABNORMAL HIGH (ref 70–99)
Glucose-Capillary: 133 mg/dL — ABNORMAL HIGH (ref 70–99)

## 2020-05-04 MED ORDER — IPRATROPIUM-ALBUTEROL 0.5-2.5 (3) MG/3ML IN SOLN
3.0000 mL | Freq: Four times a day (QID) | RESPIRATORY_TRACT | Status: DC | PRN
  Administered 2020-05-04 – 2020-05-11 (×4): 3 mL via RESPIRATORY_TRACT
  Filled 2020-05-04 (×3): qty 3

## 2020-05-04 MED ORDER — IPRATROPIUM-ALBUTEROL 0.5-2.5 (3) MG/3ML IN SOLN
RESPIRATORY_TRACT | Status: AC
  Administered 2020-05-04: 3 mL
  Filled 2020-05-04: qty 3

## 2020-05-04 MED ORDER — HYDRALAZINE HCL 25 MG PO TABS
25.0000 mg | ORAL_TABLET | Freq: Three times a day (TID) | ORAL | Status: DC
  Administered 2020-05-04 – 2020-05-16 (×36): 25 mg via ORAL
  Filled 2020-05-04 (×36): qty 1

## 2020-05-04 NOTE — Progress Notes (Signed)
Speech Language Pathology Daily Session Note  Patient Details  Name: [name] MRN: [id number] Date of Birth: [birth year]  Today's Date: 05/04/2020 SLP Individual Time: 5625-6389 SLP Individual Time Calculation (min): 41 min  Short Term Goals: Week 2: SLP Short Term Goal 1 (Week 2): Pt will use swallow strategies to achieve efficient mastication and oral clearance of Dys 2 solid texture diet with Supervision A verbal cues for use of strategies. SLP Short Term Goal 2 (Week 2): Pt will consume therapeutic trials of thin liquids per water protocol with minimal overt s/sx aspiration over 3 consectutive sessions prior to upgrade. SLP Short Term Goal 3 (Week 2): Pt will utilize slow rate, overarticulation, and increased vocal intensity to achieve intelligibility at the word level with mod assist verbal cues. SLP Short Term Goal 4 (Week 2): Pt will recall daily information with min cues for use of external aids. SLP Short Term Goal 5 (Week 2): Pt will detect functional errors with Min verbal/visual cues. SLP Short Term Goal 6 (Week 2): Pt will sustain attention to functional tasks with Min A cues for redirection.  Skilled Therapeutic Interventions: Pt was seen for skilled ST targeting dysphagia and cognition. Pt demonstrated very slow mastication of advanced Dys 3 solid snack size trail, however full oral clearance achieved. He does lack dentition, but states he had no difficulty with regular textures prior to CVA. No overt s/sx aspiration observed across solid or nectar thick liquid intake. Although trials of thin offered to pt, he politely declined, voicing his continued preference for nectar consistency at this time. SLP further facilitated session with a semi-complex money scenario task, during which pt required Mod A verbal and visual cues for problem solving and recall, Max A for error awareness. Only Min A verbal cues required for redirection during session today. Pt left laying in bed  with alarm set and needs within reach. Continue per current plan of care.        Pain Pain Assessment Pain Scale: 0-10 Pain Score: 0-No pain  Therapy/Group: Individual Therapy  Arbutus Leas 05/04/2020, 7:25 AM

## 2020-05-04 NOTE — Progress Notes (Signed)
[facility] PHYSICAL MEDICINE & REHABILITATION PROGRESS NOTE   Subjective/Complaints:  No issues overnite, LLE pain improved on gabapentin   ROS: Patient denies CP, SOB, N/V/D  Objective:   No results found. No results for input(s): WBC, HGB, HCT, PLT in the last 72 hours. No results for input(s): NA, K, CL, CO2, GLUCOSE, BUN, CREATININE, CALCIUM in the last 72 hours.  Intake/Output Summary (Last 24 hours) at 05/04/2020 0754 Last data filed at 05/04/2020 0419 Gross per 24 hour  Intake 318 ml  Output 750 ml  Net -432 ml     Physical Exam: Vital Signs Blood pressure (!) 168/97, pulse 73, temperature 97.6 F (36.4 C), resp. rate 18, height 5\' 11"  (1.803 m), weight 99.3 kg, SpO2 100 %.  General: No acute distress Mood and affect are appropriate Heart: Regular rate and rhythm no rubs murmurs or extra sounds Lungs: Clear to auscultation, breathing unlabored, no rales or wheezes Abdomen: Positive bowel sounds, soft nontender to palpation, nondistended Extremities: No clubbing, cyanosis, or edema Skin: No evidence of breakdown, no evidence of rash   Musc: mild pain with PROM LLE, no focal knee abnl today RUE: 3/5 proximal to distal, unchanged, RLE: HF, KE 3-/4 ADF 0/5, unchanged Dysarthria significant Right facial weakness  Assessment/Plan: 1. Functional deficits secondary to Left pontine infarct which require 3+ hours per day of interdisciplinary therapy in a comprehensive inpatient rehab setting.  Physiatrist is providing close team supervision and 24 hour management of active medical problems listed below.  Physiatrist and rehab team continue to assess barriers to discharge/monitor patient progress toward functional and medical goals  Care Tool:  Bathing    Body parts bathed by patient: Right arm, Left arm, Chest, Abdomen, Front perineal area, Right upper leg, Left upper leg, Face, Left lower leg   Body parts bathed by helper: Right lower leg, Buttocks Body parts n/a:  Right lower leg, Left lower leg, Buttocks (did not attempt this session)   Bathing assist Assist Level: Moderate Assistance - Patient 50 - 74%     Upper Body Dressing/Undressing Upper body dressing   What is the patient wearing?: Pull over shirt    Upper body assist Assist Level: Contact Guard/Touching assist    Lower Body Dressing/Undressing Lower body dressing      What is the patient wearing?: Pants     Lower body assist Assist for lower body dressing: Moderate Assistance - Patient 50 - 74%     Toileting Toileting    Toileting assist Assist for toileting: Maximal Assistance - Patient 25 - 49%     Transfers Chair/bed transfer  Transfers assist     Chair/bed transfer assist level: Moderate Assistance - Patient 50 - 74% (squat pivot)     Locomotion Ambulation   Ambulation assist   Ambulation activity did not occur: Safety/medical concerns  Assist level: Maximal Assistance - Patient 25 - 49% Assistive device: Walker-rolling Max distance: 5' back and forth.   Walk 10 feet activity   Assist  Walk 10 feet activity did not occur: Safety/medical concerns  Assist level: 2 helpers (max A of 1 and +2 w/c follow) Assistive device: Other (comment) (L hallway rail)   Walk 50 feet activity   Assist Walk 50 feet with 2 turns activity did not occur: Safety/medical concerns         Walk 150 feet activity   Assist Walk 150 feet activity did not occur: Safety/medical concerns         Walk 10 feet on uneven  surface  activity   Assist Walk 10 feet on uneven surfaces activity did not occur: Safety/medical concerns         Wheelchair     Assist Will patient use wheelchair at discharge?: Yes Type of Wheelchair: Manual    Wheelchair assist level: Minimal Assistance - Patient > 75% Max wheelchair distance: 50    Wheelchair 50 feet with 2 turns activity    Assist        Assist Level: Minimal Assistance - Patient > 75%   Wheelchair  150 feet activity     Assist          Blood pressure (!) 168/97, pulse 73, temperature 97.6 F (36.4 C), resp. rate 18, height 5\' 11"  (1.803 m), weight 99.3 kg, SpO2 100 %.  Medical Problem List and Plan: 1.  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  Recommendations a 30-day cardiac event monitor as outpatient  Continue CIR PT, OT SLP   PT, OT SLP  [facility] ordered 2.  Antithrombotics: -DVT/anticoagulation: Lovenox             -antiplatelet therapy: Aspirin 325 mg and Plavix 75 mg daily x3 months then Plavix alone 3. Pain Management: Voltaren gel 4 times daily, Neurontin 300 mg 3 times daily    -LLE pain chronic with normal L hip MRI 2019, ? CVA related vs sciatic, increase gabapentin,  lumbar films show disc height loss L5-S1, no spondylolisthesis. Gabapentin increased to 600mg  tid 6/11-improved   4. Mood: Cymbalta 30 mg twice daily             -antipsychotic agents: Invega 3 mg daily 5. Neuropsych: This patient is capable of making decisions on his own behalf. 6. Skin/Wound Care: Routine skin checks 7. Fluids/Electrolytes/Nutrition: Routine in and outs with follow-up chemistries 8.  Diastolic congestive heart failure.  Monitor for any signs of fluid overload Filed Weights   05/02/20 0500 05/03/20 0454 05/04/20 0426  Weight: 101 kg 99 kg 99.3 kg   Baseline appear to be ~98kg----up to 100kg 6/13--does not appear volume overloaded, I/O's negative 145cc yesterday   -follow for trend, accuracy of weights  9.  Diabetes mellitus with peripheral neuropathy.  Hemoglobin A1c 7.7.  SSI.    Glucophage 500 mg twice daily prior to admission, resumed on 6/4 CBG (last 3)  Recent Labs    05/03/20 1633 05/03/20 2133 05/04/20 0613  GLUCAP 123* 117* 140*   Controlled 6/17 10.  Hypertension.  Patient on Coreg 25 mg twice daily,  Norvasc 10 mg daily, hydralazine 25 mg 3 times daily, Cozaar 50 mg daily prior to admission.  Resume as needed Vitals:   05/04/20 0039 05/04/20 0426  BP: (!) 135/106 (!) 168/97  Pulse: 72 73  Resp:  18  Temp:  97.6 F (36.4 C)  SpO2:  100%   Amlodipine 5 started on 6/4, increase to 10 on 6/7, on Coreg 25mg  BID   Cozaar  50mg  ,   -added hydralazine 10mg  tid increased to QID on 6/14, will increase to 25mg  tid TID, monitor orthostatic sx   11.  Hyperlipidemia.  Lipitor 12.  History of polysubstance tobacco and alcohol abuse.  Urine drug screen negative.  Provide counseling 13.  Post stroke dysphagia  D2, nectars, advance diet as tolerated   -BUN nl clinically appears adequately hydrated  LOS: 14 days A FACE TO FACE  EVALUATION WAS PERFORMED  Charlett Blake 05/04/2020, 7:54 AM

## 2020-05-04 NOTE — Progress Notes (Signed)
Physical Therapy Session Note  Patient Details  Name: [name] MRN: [id number] Date of Birth: 1960-12-10  Today's Date: 05/04/2020 PT Individual Time: 7371-0626 and 9485-4627 PT Individual Time Calculation (min): 53 min and 32 min  Short Term Goals: Week 2:  PT Short Term Goal 1 (Week 2): Pt will perform supine<>sit with mod assist consistently without bed features PT Short Term Goal 2 (Week 2): Pt will perform bed<>chair transfers consistently with mod assist of 1 PT Short Term Goal 3 (Week 2): Pt will perform sit<>stands consistently with +2 mod assist PT Short Term Goal 4 (Week 2): Pt will initiate gait training  Skilled Therapeutic Interventions/Progress Updates:    Session 1: Pt received sitting in w/c and agreeable to therapy session. RN approved therapist to place muscle rub on pt's L hip - pt stood with L UE support on footboard of bed with mod assist while therapist applied rub total assist. Transported to/from gym in w/c for time management and energy conservation. Sit<>stands to/from w/c with max cuing for B UE placement on w/c arm rests and bringing R foot back under him for improved BOS with mod assist for lifting/lowering. Gait training ~22ft at L hallway rail with max assist and +2 w/c follow - pt able to step R LE forward ~65% of the time with min/mod assist to correct positioning prior to weightbearing, max cuing for L lateral and anterior weight shift during L stance to decrease pushing - mod assist to block R knee buckle/hyperextension during stance. Progressed to gait training ~32ft, ~25ft x2 using RW while still at L hallway rail for safety with max assist from therapist for balance due to increased pushing using RW compared to rail as pt more unstable causing strong R lean and +2 w/c follow - continues to require min/mod assist to correct R LE positioning prior to initiating stance (excessive hip adduction) and min/mod assist to block R knee buckle/hyperextension  during stance - max cuing throughout for L lateral and anterior weight shift during L stance (cuing to "bring hip to L hand on RW"). Standing balance and midline orientation task with elevated mat table on pt's L side as external target for L hip weight shift with mirror feedback and L HHA performed R LE foot taps on/off 4" steps while wearing 4lb weight on R ankle - with mod/max assist for balance depending on pt's ability to maintain L weight shift (repeated cuing to correct this). Transported back to room in w/c and left seated in w/c with needs in reach and seat belt alarm on.  Session 2: Pt received supine in bed and agreeable to therapy session. Supine>sitting L EOB, HOB elevated and using bedrail, with min assist for trunk upright and mod assist for scooting hips to EOB due to fatigue this time of day. RN present for medication administration. Donned shoes max/total assist. R squat pivot transfer to w/c with heavy mod assist for lifting and pivoting hips - cuing for head/hips relationship.  Transported to/from gym in w/c for time management and energy conservation. L squat pivot to EOM with light mod assist for lifting/pivoting hips. Sitting EOM dynamic trunk control task via use of boxing gloves and punching bag focusing on balance and R UE NMR - performed seated cross body punches using bag progressed to cross body punching to therapist promoting further reaching outside BOS and increased R UE use - progressed to L lateral trunk lean onto forearm while punching bag then therapist with R UE. R squat pivot  transfer to w/c with mod assist for lifting and pivoting hips with repeat cuing for head/hips relationship. Transported back to room and left seated in w/c with needs in reach and seat belt alarm on.  Therapy Documentation Precautions:  Precautions Precautions: Fall Precaution Comments: pusher, right hemiparesis Restrictions Weight Bearing Restrictions: No  Pain:   Session 1: Continues to  report L hip pain but able to tolerate progression of gait training today - provided muscle rub at beginning of session and RN confirmed pt had all pain medications prior - therapist provided seated rest breaks as needed for pain management.  Session 2: Continues to report L hip pain - noted to still be pushing in sitting with L hip elevated - likely more prominent this time of day due to fatigue - RN provided medications at beginning of session.   Therapy/Group: Individual Therapy  Tawana Scale, PT, DPT 05/04/2020, 7:52 AM

## 2020-05-04 NOTE — Progress Notes (Signed)
Occupational Therapy Session Note  Patient Details  Name: [name] MRN: [id number] Date of Birth: 1961-01-24  Today's Date: 05/04/2020 OT Individual Time: 0800-0901 OT Individual Time Calculation (min): 61 min    Short Term Goals: Week 2:  OT Short Term Goal 1 (Week 2): Pt will complete squat pivot transfer to the drop arm commode with mod assist. OT Short Term Goal 2 (Week 2): Pt will donn brief and pants with mod assist sit to stand for two consecutive sessions. OT Short Term Goal 3 (Week 2): Pt will complete UB dressing with supervision for 2 consecutive sessions. OT Short Term Goal 4 (Week 2): Pt will perform walk-in shower transfer with mod assist stand pivot.  Skilled Therapeutic Interventions/Progress Updates:    Pt began with transfer from supine to sit EOB with min assist.  He then worked on donning his pants in sitting with min guard for placing them over his LEs but needed max assist to stand and pull them up over his hips.  He then donned his slip on shoes, but needed mod assist to complete this.  Max assist was needed for stand pivot transfer to the wheelchair.  Next, he was able to complete oral hygiene in sitting before being transferred down to the therapy gym.  Once in the gym had him work on [employer] with use of the UE ergonometer.  He was able to complete 1 set of 4 mins with resistance on level 6 and RPMs maintained at 18-22 with BUEs.  He then completed 2 sets of 2 mins using just the RUE and maintaining RPMs at 12-15 per minute.  During this session he was able to maintain grip on the right handle.  Next, he progressed to completion of the [employer] in sitting with use of the LUE with just the lower quadrants.  He needed an average of 6-6.5 seconds between lights using isolated use of the right index finger.  Finished session with return back to the room and pt left sitting up in the wheelchair with call button and phone in reach.     Therapy  Documentation Precautions:  Precautions Precautions: Fall Precaution Comments: pusher, right hemiparesis Restrictions Weight Bearing Restrictions: No  Pain: Pain Assessment Pain Scale: Faces Pain Score: 0-No pain ADL: See Care Tool Section for some details of mobility and selfcare  Therapy/Group: Individual Therapy  Serria Sloma OTR/L 05/04/2020, 12:19 PM

## 2020-05-05 ENCOUNTER — Inpatient Hospital Stay (HOSPITAL_COMMUNITY): Payer: Medicaid Other | Admitting: Speech Pathology

## 2020-05-05 ENCOUNTER — Inpatient Hospital Stay (HOSPITAL_COMMUNITY): Payer: Medicaid Other | Admitting: Physical Therapy

## 2020-05-05 ENCOUNTER — Inpatient Hospital Stay (HOSPITAL_COMMUNITY): Payer: Medicaid Other | Admitting: Occupational Therapy

## 2020-05-05 LAB — URINALYSIS, ROUTINE W REFLEX MICROSCOPIC
Bacteria, UA: NONE SEEN
Bilirubin Urine: NEGATIVE
Glucose, UA: NEGATIVE mg/dL
Ketones, ur: NEGATIVE mg/dL
Leukocytes,Ua: NEGATIVE
Nitrite: NEGATIVE
Protein, ur: 100 mg/dL — AB
RBC / HPF: >50 RBC/hpf — ABNORMAL HIGH (ref 0–5)
Specific Gravity, Urine: 1.023 (ref 1.005–1.030)
pH: 5 (ref 5.0–8.0)

## 2020-05-05 LAB — GLUCOSE, CAPILLARY
Glucose-Capillary: 138 mg/dL — ABNORMAL HIGH (ref 70–99)
Glucose-Capillary: 94 mg/dL (ref 70–99)
Glucose-Capillary: 141 mg/dL — ABNORMAL HIGH (ref 70–99)
Glucose-Capillary: 172 mg/dL — ABNORMAL HIGH (ref 70–99)

## 2020-05-05 NOTE — Progress Notes (Signed)
Occupational Therapy Weekly Progress Note  Patient Details  Name: [name] MRN: [id number] Date of Birth: 10-May-1961  Beginning of progress report period: April 29, 2020 End of progress report period: May 05, 2020  Today's Date: 05/05/2020 OT Individual Time: 6715-1951 OT Individual Time Calculation (min): 61 min    Patient has met 1 of 4 short term goals.  [name] is making slow but steady progress with OT at this time.  He currently continues to be limited by left hip pain with sit to stand and standing during selfcare tasks.  Pusher tendencies to the right are also still present in standing but have improved from sitting position.  He is able to complete UB dressing with overall min assist sitting EOB unsupported.  LB dressing is still at a mod to max assist level secondary to needing max assist for sit to stand when pulling items over his hips.  He is able to complete squat pivot transfers from the wheelchair to the drop arm commode at mod assist level, but continues to need max assist for stand pivot transfers.  If standing with BUE support in front of him, he can stand with overall mod assist, however standing endurance is limited secondary to increased left hip pain.  [name] is making good progress with his RUE functional use.  He is currently able to integrate it into selfcare tasks as an active assist with supervision for washing the LUE or when donning clothing.  Overall, feel that he still has the potential to reach established min assist level goals.  Will continue working toward these until expected discharge on 5/29.  Recommend continued CIR intense OT at this time.    Patient continues to demonstrate the following deficits: muscle weakness, impaired timing and sequencing, unbalanced muscle activation, decreased coordination and decreased motor planning, decreased midline orientation and decreased standing balance, decreased postural control, hemiplegia and  decreased balance strategies and therefore will continue to benefit from skilled OT intervention to enhance overall performance with BADL and Reduce care partner burden.  Patient progressing toward long term goals..  Continue plan of care.  OT Short Term Goals Week 3:  OT Short Term Goal 1 (Week 3): Pt will donn brief and pants with mod assist sit to stand for two consecutive sessions. OT Short Term Goal 2 (Week 3): Pt will complete UB dressing with supervision for 2 consecutive sessions. OT Short Term Goal 3 (Week 3): Pt will perform walk-in shower transfer with mod assist stand pivot. OT Short Term Goal 4 (Week 3): Pt will complete bathing sit to stand with mod assist.  Skilled Therapeutic Interventions/Progress Updates:    Pt completed supine to sit EOB with mod assist and mod instructional cueing for technique.  He then worked on donning his pants from the EOB with min assist for threading them over his LEs and max assist for standing to pull them up over his hips, secondary to increased pushing to the right.  He was able to complete transfer squat pivot to the wheelchair next with transition down to the dayroom.  He attempted to propel his wheelchair, but demonstrated decreased consistency with the RUE, so therapist assisted secondary to decreased time.  Had pt work initially in sitting at the high/low table on RUE functional coordination with use of the pegs and the [employer].  He was able to efficiently pick up the larger 2-3" pegs and place in the peg board with the RUE.  He demonstrated much greater difficulty attempting  to stack the [employer] pieces with the RUE, and was only able to achieve stacking 3 of them with one on top of the other.  He progressed to standing with mod assist while using the RUE to pick up the [place] pieces and place in the container.  Standing endurance limited to less than one minute secondary to increased left hip pain.   Emphasis on weightshift to the left side while  reaching for pieces in both sitting and standing.  Finished session with transfer back to the room via wheelchair and squat pivot transfer back to the bed to rest with mod assist.  He needed min assist for straightening up in the bed once in supine.    Therapy Documentation Precautions:  Precautions Precautions: Fall Precaution Comments: pusher, right hemiparesis Restrictions Weight Bearing Restrictions: No  Pain:  See Flowsheet with pain report  ADL: See Care Tool Section for some details of mobility and selfcare  Therapy/Group: Individual Therapy  Myleen Brailsford OTR/L 05/05/2020, 4:25 PM

## 2020-05-05 NOTE — Progress Notes (Signed)
Pt spilled urinal/and incontinent, there was a moderate area of blood present in brief. When examined there was dried blood present on head of penis. While being cleansed there was a small amount of bloody drainage from penis. Pt unable to tell RN if he has had bloody urine. Denies pain at this time. Spoke with Algis Liming PA will do urine times five and send UA. [name] nurse and the NT's informed of new orders and specimen cups left in room.

## 2020-05-05 NOTE — Progress Notes (Signed)
Physical Therapy Weekly Progress Note  Patient Details  Name: [name] MRN: [id number] Date of Birth: 05/22/1961  Beginning of progress report period: April 28, 2020 End of progress report period: May 05, 2020  Today's Date: 05/05/2020 PT Individual Time: 0807-0906 PT Individual Time Calculation (min): 59 min   Patient has met 4 of 4 short term goals.  [name] is progressing well with therapy demonstrating improving R hemibody strength, muscle activation, and motor control although he has continued to have L hip pain limiting progression of functional standing and gait training. He is performing supine<>sit with mod assist, squat pivot transfers with mod assist, sit<>stands with max assist, and gait training 41f using RW at hallway rail for safety with heavy max assist of 1 and +2 w/c follow. He continues to demonstrate pushing with L LE causing strong R lean in stance with significant focus on therapy sessions of reorienting to midline in sitting and standing.  Patient continues to demonstrate the following deficits muscle weakness, muscle joint tightness and muscle paralysis, decreased cardiorespiratoy endurance, impaired timing and sequencing, abnormal tone, unbalanced muscle activation, motor apraxia, decreased coordination and decreased motor planning, decreased midline orientation, decreased initiation, decreased attention, decreased awareness, decreased problem solving, decreased safety awareness, decreased memory and delayed processing and decreased sitting balance, decreased standing balance, decreased postural control and decreased balance strategies and therefore will continue to benefit from skilled PT intervention to increase functional independence with mobility.  Patient progressing toward long term goals..  Continue plan of care.  PT Short Term Goals Week 2:  PT Short Term Goal 1 (Week 2): Pt will perform supine<>sit with mod assist consistently without bed  features PT Short Term Goal 1 - Progress (Week 2): Met PT Short Term Goal 2 (Week 2): Pt will perform bed<>chair transfers consistently with mod assist of 1 PT Short Term Goal 2 - Progress (Week 2): Met PT Short Term Goal 3 (Week 2): Pt will perform sit<>stands consistently with +2 mod assist PT Short Term Goal 3 - Progress (Week 2): Met PT Short Term Goal 4 (Week 2): Pt will initiate gait training PT Short Term Goal 4 - Progress (Week 2): Met Week 3:  PT Short Term Goal 1 (Week 3): Pt will perform supine<>sit with min assist consistently PT Short Term Goal 2 (Week 3): Pt will perform bed<>chair transfers with min assist consistently PT Short Term Goal 3 (Week 3): Pt will perform sit<>stands with mod assist of 1 PT Short Term Goal 4 (Week 3): Pt will ambulate at least 179fusing LRAD with mod assist of 1 PT Short Term Goal 5 (Week 3): Pt will perform 2022ff wheelchair propulsion with min assist  Skilled Therapeutic Interventions/Progress Updates:  Ambulation/gait training;Community reintegration;DME/adaptive equipment instruction;Neuromuscular re-education;Psychosocial support;Stair training;UE/LE Strength taining/ROM;Wheelchair propulsion/positioning;Balance/vestibular training;Discharge planning;Functional electrical stimulation;Pain management;Skin care/wound management;Cognitive remediation/compensation;Functional mobility training;Therapeutic Activities;UE/LE Coordination activities;Disease management/prevention;Patient/family education;Splinting/orthotics;Therapeutic Exercise;Visual/perceptual remediation/compensation    Pt received supine in bed asleep but awakens upon verbal stimulus. Pt reports he is really tired today and feels that 3 hours of therapy a day is too much for him. Pt demonstrates increased frustration this morning with the level of fatigue he is experiencing stating "you are killing me." Therapist educated pt on alternate scheduling option of 15/7 if pt needs more rest but  pt declines this change. Therapist provided emotional support and education on importance of therapy to progress functional mobility level. Pt agreeable to therapy session and transferring to wheelchair. Supine>sitting L EOB, HOB flat and using bedrail,  with heavy min assist for R LE management and trunk upright - cuing for sequencing. Sitting EOB donned pants and shoes max assist for time management. Sit>stand elevated EOB>L UE support on bedrail with max assist and total assist to pull pants over hips. R squat pivot transfer EOB>w/c with mod assist for lifting/pivoting hips. Attemtped w/c propuilsion but pt repeatedly states "I can't" with very poor frustartion tolerance and not receptive to therapist education on need to perform equal propulsion with R UE due to weakness ant not agreeable to attempt using L LE to assist with propulsion. Pt often talking very fast and not clear when frustrated so therapist used technique of repeating back to patient what he was saying to ensure understanding but pt later sates when he gets frustrated he doesn't feel that he expresses himself correctly. MD notified of pt's continued L hip pain and that pt reports the medications are helping and notified MD of pt's concerns regarding "wearing down" his hip when performing standing/ambulating tasks during therapy. RN notified for medication administration. Pt requesting to return to bed. L squat pivot transfer w/c>EOB using L UE support on bedrail with min assist for pivoting hips. Sit>supine with min assist for B LE management. Pt left supine in bed with needs in reach, bed alarm on, and RN present.  Therapy Documentation Precautions:  Precautions Precautions: Fall Precaution Comments: pusher, right hemiparesis Restrictions Weight Bearing Restrictions: No  Pain:   Continues to report L hip pain - notified RN for medication administration.  Therapy/Group: Individual Therapy  Tawana Scale, PT, DPT 05/05/2020, 7:55 AM

## 2020-05-05 NOTE — Progress Notes (Signed)
[facility] PHYSICAL MEDICINE & REHABILITATION PROGRESS NOTE   Subjective/Complaints:  No issues overnite  ROS: Patient denies CP, SOB, N/V/D  Objective:   No results found. No results for input(s): WBC, HGB, HCT, PLT in the last 72 hours. No results for input(s): NA, K, CL, CO2, GLUCOSE, BUN, CREATININE, CALCIUM in the last 72 hours.  Intake/Output Summary (Last 24 hours) at 05/05/2020 0758 Last data filed at 05/05/2020 0755 Gross per 24 hour  Intake 1745 ml  Output 1150 ml  Net 595 ml     Physical Exam: Vital Signs Blood pressure (!) 151/93, pulse 74, temperature 98.3 F (36.8 C), temperature source Oral, resp. rate 18, height 5\' 11"  (1.803 m), weight 100.4 kg, SpO2 99 %.  General: No acute distress Mood and affect are appropriate Heart: Regular rate and rhythm no rubs murmurs or extra sounds Lungs: Clear to auscultation, breathing unlabored, no rales or wheezes Abdomen: Positive bowel sounds, soft nontender to palpation, nondistended Extremities: No clubbing, cyanosis, or edema Skin: No evidence of breakdown, no evidence of rash  Musc: mild pain with PROM LLE, no focal knee abnl today RUE: 3/5 proximal to distal, unchanged, RLE: HF, KE 3-/4 ADF 0/5, unchanged Dysarthria significant Right facial weakness  Assessment/Plan: 1. Functional deficits secondary to Left pontine infarct which require 3+ hours per day of interdisciplinary therapy in a comprehensive inpatient rehab setting.  Physiatrist is providing close team supervision and 24 hour management of active medical problems listed below.  Physiatrist and rehab team continue to assess barriers to discharge/monitor patient progress toward functional and medical goals  Care Tool:  Bathing    Body parts bathed by patient: Right arm, Left arm, Chest, Abdomen, Front perineal area, Right upper leg, Left upper leg, Face, Left lower leg   Body parts bathed by helper: Right lower leg, Buttocks Body parts n/a: Right  lower leg, Left lower leg, Buttocks (did not attempt this session)   Bathing assist Assist Level: Moderate Assistance - Patient 50 - 74%     Upper Body Dressing/Undressing Upper body dressing   What is the patient wearing?: Pull over shirt    Upper body assist Assist Level: Contact Guard/Touching assist    Lower Body Dressing/Undressing Lower body dressing      What is the patient wearing?: Pants     Lower body assist Assist for lower body dressing: Maximal Assistance - Patient 25 - 49%     Toileting Toileting    Toileting assist Assist for toileting: Maximal Assistance - Patient 25 - 49%     Transfers Chair/bed transfer  Transfers assist     Chair/bed transfer assist level: Moderate Assistance - Patient 50 - 74% (squat pivot)     Locomotion Ambulation   Ambulation assist   Ambulation activity did not occur: Safety/medical concerns  Assist level: 2 helpers (heavy max A of 1 and +2 w/c follow) Assistive device: Walker-rolling Max distance: 55ft   Walk 10 feet activity   Assist  Walk 10 feet activity did not occur: Safety/medical concerns  Assist level: 2 helpers (heavy max A of 1 and +2 w/c follow) Assistive device: Walker-rolling   Walk 50 feet activity   Assist Walk 50 feet with 2 turns activity did not occur: Safety/medical concerns         Walk 150 feet activity   Assist Walk 150 feet activity did not occur: Safety/medical concerns         Walk 10 feet on uneven surface  activity   Assist  Walk 10 feet on uneven surfaces activity did not occur: Safety/medical concerns         Wheelchair     Assist Will patient use wheelchair at discharge?: Yes Type of Wheelchair: Manual    Wheelchair assist level: Minimal Assistance - Patient > 75% Max wheelchair distance: 50    Wheelchair 50 feet with 2 turns activity    Assist        Assist Level: Minimal Assistance - Patient > 75%   Wheelchair 150 feet activity      Assist          Blood pressure (!) 151/93, pulse 74, temperature 98.3 F (36.8 C), temperature source Oral, resp. rate 18, height 5\' 11"  (1.803 m), weight 100.4 kg, SpO2 99 %.  Medical Problem List and Plan: 1.  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  Recommendations a 30-day cardiac event monitor as outpatient  Continue CIR PT, OT SLP   PT, OT SLP  [facility] ordered 2.  Antithrombotics: -DVT/anticoagulation: Lovenox             -antiplatelet therapy: Aspirin 325 mg and Plavix 75 mg daily x3 months then Plavix alone 3. Pain Management: Voltaren gel 4 times daily, Neurontin 300 mg 3 times daily    -LLE pain chronic with normal L hip MRI 2019, ? CVA related vs sciatic, increase gabapentin,  lumbar films show disc height loss L5-S1, no spondylolisthesis. Gabapentin increased to 600mg  tid 6/11-improved   4. Mood: Cymbalta 30 mg twice daily             -antipsychotic agents: Invega 3 mg daily 5. Neuropsych: This patient is capable of making decisions on his own behalf. 6. Skin/Wound Care: Routine skin checks 7. Fluids/Electrolytes/Nutrition: Routine in and outs with follow-up chemistries 8.  Diastolic congestive heart failure.  Monitor for any signs of fluid overload Filed Weights   05/03/20 0454 05/04/20 0426 05/05/20 0459  Weight: 99 kg 99.3 kg 100.4 kg   Baseline appear to be ~98kg----up to 100kg 6/13--does not appear volume overloaded, I/O's negative 145cc yesterday   -follow for trend, accuracy of weights  9.  Diabetes mellitus with peripheral neuropathy.  Hemoglobin A1c 7.7.  SSI.    Glucophage 500 mg twice daily prior to admission, resumed on 6/4 CBG (last 3)  Recent Labs    05/04/20 1628 05/04/20 2058 05/05/20 0613  GLUCAP 118* 133* 138*   Controlled 6/18 10.  Hypertension.  Patient on Coreg 25 mg  twice daily, Norvasc 10 mg daily, hydralazine 25 mg 3 times daily, Cozaar 50 mg daily prior to admission.  Resume as needed Vitals:   05/04/20 2256 05/05/20 0459  BP:  (!) 151/93  Pulse:  74  Resp:  18  Temp:  98.3 F (36.8 C)  SpO2: 94% 99%   Amlodipine 5 started on 6/4, increase to 10 on 6/7, on Coreg 25mg  BID   Cozaar  50mg  ,   -added hydralazine 10mg  tid increased to QID on 6/14, will increase to 25mg  tid TID, monitor orthostatic sx   11.  Hyperlipidemia.  Lipitor 12.  History of polysubstance tobacco and alcohol abuse.  Urine drug screen negative.  Provide counseling 13.  Post stroke dysphagia  D2, nectars, advance diet as tolerated   -BUN nl clinically appears adequately hydrated  LOS: 15 days A FACE TO FACE EVALUATION WAS PERFORMED  Luanna Salk Imanie Darrow 05/05/2020, 7:58 AM

## 2020-05-05 NOTE — Progress Notes (Signed)
Speech Language Pathology Weekly Progress and Session Note  Patient Details  Name: Brolin Dambrosia MRN: [id number] Date of Birth: 11-20-1960  Beginning of progress report period: April 28, 2020 End of progress report period: May 05, 2020  Today's Date: 05/05/2020 SLP Individual Time: 1115-1200 SLP Individual Time Calculation (min): 45 min  Short Term Goals: Week 2: SLP Short Term Goal 1 (Week 2): Pt will use swallow strategies to achieve efficient mastication and oral clearance of Dys 2 solid texture diet with Supervision A verbal cues for use of strategies. SLP Short Term Goal 1 - Progress (Week 2): Met SLP Short Term Goal 2 (Week 2): Pt will consume therapeutic trials of thin liquids per water protocol with minimal overt s/sx aspiration over 3 consectutive sessions prior to upgrade. SLP Short Term Goal 2 - Progress (Week 2): Progressing toward goal SLP Short Term Goal 3 (Week 2): Pt will utilize slow rate, overarticulation, and increased vocal intensity to achieve intelligibility at the word level with mod assist verbal cues. SLP Short Term Goal 3 - Progress (Week 2): Met SLP Short Term Goal 4 (Week 2): Pt will recall daily information with min cues for use of external aids. SLP Short Term Goal 4 - Progress (Week 2): Met SLP Short Term Goal 5 (Week 2): Pt will detect functional errors with Min verbal/visual cues. SLP Short Term Goal 5 - Progress (Week 2): Progressing toward goal SLP Short Term Goal 6 (Week 2): Pt will sustain attention to functional tasks with Min A cues for redirection. SLP Short Term Goal 6 - Progress (Week 2): Partly met    New Short Term Goals: Week 3: SLP Short Term Goal 1 (Week 3): Pt will consume upgraded trials of Dys 3 with efficient mastication and oral clearance and Supervision A for use of swallow strategies X2 prior to advancement. SLP Short Term Goal 2 (Week 3): Pt will consume therapeutic trials of thin liquids per water protocol with minimal overt  s/sx aspiration over 3 consectutive sessions prior to upgrade. SLP Short Term Goal 3 (Week 3): Pt will utilize slow rate, overarticulation, and increased vocal intensity to achieve intelligibility at the word level with Min assist verbal cues. SLP Short Term Goal 4 (Week 3): Pt will recall daily information with Supervision cues for use of external aids. SLP Short Term Goal 5 (Week 3): Pt will detect functional errors with Min verbal/visual cues. SLP Short Term Goal 6 (Week 3): Pt will sustain attention to functional tasks with Min A cues for redirection.  Weekly Progress Updates: Pt has made functional gains and met 3 out of 6 short term goals, pt partially met 1 ST goal but due to inconsistency did not classify as fully met. Pt is currently Min-Mod assist for basic tasks due to cognitive impairments impacting basic problem solving, sustained attention, emergent awareness, and short term recall. Pt also with moderate dysarthria, requiring Min-Mod cueing for use of strategies to compensate and achieve ~60-70% intelligibility in conversation. Pt is consuming Dys 2 (minced/ground) diet with nectar thick liquids and is on the free water protocol. He is progressing toward advanced solids, but mastication is not fully efficient yet. Pt education is ongoing; not family has been present for ST sessions. Pt would continue to benefit from skilled ST while inpatient in order to maximize functional independence and reduce burden of care prior to discharge. Anticipate that pt will need 24/7 supervision at discharge in addition to [facility] follow up at next level of care.  Intensity: Minumum of 1-2 x/day, 30 to 90 minutes Frequency: 3 to 5 out of 7 days Duration/Length of Stay: 05/16/20 Treatment/Interventions: Cognitive remediation/compensation;Cueing hierarchy;Internal/external aids;Patient/family education;Speech/Language facilitation;Functional tasks;Dysphagia/aspiration precaution training   Daily  Session  Skilled Therapeutic Interventions: Pt was seen for skilled ST targeting dysphagia and cognition. Pt accepted upgraded trials of thin coke today, exhibiting immediate cough or throat clear in 7 out of 11 sips via cup. Would recommend pt continue nectar thick liquids. SLP further facilitated session with a novel semi-complex card task (blink), during which initially pt required  Mod A cues for recall and problem solving, however could be faded to Supervision A for said skills, as well as error awareness ~1/3 of the way through the task. Pt became increasingly fatigued throughout session, and ultimately missed 15 mins skilled ST. Pt left sleeping in bed with alarm set and needs within reach. Continue per current plan of care.         Pain Pain Assessment Pain Scale: Faces Faces Pain Scale: No hurt   Therapy/Group: Individual Therapy  Arbutus Leas 05/05/2020, 7:22 AM

## 2020-05-05 NOTE — Progress Notes (Signed)
Upon assessment patient reported SOB. Lungs auscultated and wheezing heard to right upper lobe. O2 sat 93%. Respiration even and non-labored, skin warm and dry. [facility] notified and gave new order for Neb tx PRN. Respiratory called to treat.

## 2020-05-06 LAB — GLUCOSE, CAPILLARY
Glucose-Capillary: 126 mg/dL — ABNORMAL HIGH (ref 70–99)
Glucose-Capillary: 141 mg/dL — ABNORMAL HIGH (ref 70–99)
Glucose-Capillary: 103 mg/dL — ABNORMAL HIGH (ref 70–99)
Glucose-Capillary: 130 mg/dL — ABNORMAL HIGH (ref 70–99)

## 2020-05-06 MED ORDER — GABAPENTIN 400 MG PO CAPS
800.0000 mg | ORAL_CAPSULE | Freq: Three times a day (TID) | ORAL | Status: DC
  Administered 2020-05-06 – 2020-05-10 (×12): 800 mg via ORAL
  Filled 2020-05-06 (×12): qty 2

## 2020-05-06 NOTE — Progress Notes (Addendum)
Pt manual BP is 176/109 at 05:53. Pt stated his pain is a "17/10" in L leg. Patient given PRN tylenol and flexeril. Scheduled hydralazine given. BP rechecked manually at 06:30 for 151/98. Pt resting. No signs of distress

## 2020-05-06 NOTE — Progress Notes (Signed)
PHYSICAL MEDICINE & REHABILITATION PROGRESS NOTE   Subjective/Complaints:  Patient complains of LLE pain. Does not feel sleepy with gabapentin and would like to try a higher dose.  BP has been elevated.  ROS: Patient denies CP, SOB, N/V/D  Objective:   No results found. No results for input(s): WBC, HGB, HCT, PLT in the last 72 hours. No results for input(s): NA, K, CL, CO2, GLUCOSE, BUN, CREATININE, CALCIUM in the last 72 hours.  Intake/Output Summary (Last 24 hours) at 05/06/2020 1727 Last data filed at 05/06/2020 1236 Gross per 24 hour  Intake 825 ml  Output 825 ml  Net 0 ml     Physical Exam: Vital Signs Blood pressure (!) 127/96, pulse 83, temperature 98.4 F (36.9 C), resp. rate 16, height 5\' 11"  (1.803 m), weight 100.6 kg, SpO2 97 %.   General: No apparent distress HEENT: Head is normocephalic, atraumatic, PERRLA, EOMI, sclera anicteric, oral mucosa pink and moist, dentition intact, ext ear canals clear,  Neck: Supple without JVD or lymphadenopathy Heart: Reg rate and rhythm. No murmurs rubs or gallops Chest: CTA bilaterally without wheezes, rales, or rhonchi; no distress Abdomen: Soft, non-tender, non-distended, bowel sounds positive. Extremities: No clubbing, cyanosis, or edema. Pulses are 2+ Skin: Clean and intact without signs of breakdown  Musc: mild pain with PROM LLE, no focal knee abnl today RUE: 3/5 proximal to distal, unchanged, RLE: HF, KE 3-/4 ADF 0/5, unchanged Dysarthria significant Right facial weakness  Assessment/Plan: 1. Functional deficits secondary to Left pontine infarct which require 3+ hours per day of interdisciplinary therapy in a comprehensive inpatient rehab setting.  Physiatrist is providing close team supervision and 24 hour management of active medical problems listed below.  Physiatrist and rehab team continue to assess barriers to discharge/monitor patient progress toward functional and medical goals  Care  Tool:  Bathing    Body parts bathed by patient: Right arm, Left arm, Chest, Abdomen, Front perineal area, Right upper leg, Left upper leg, Face, Left lower leg   Body parts bathed by helper: Right lower leg, Buttocks Body parts n/a: Right lower leg, Left lower leg, Buttocks (did not attempt this session)   Bathing assist Assist Level: Moderate Assistance - Patient 50 - 74%     Upper Body Dressing/Undressing Upper body dressing   What is the patient wearing?: Pull over shirt    Upper body assist Assist Level: Contact Guard/Touching assist    Lower Body Dressing/Undressing Lower body dressing      What is the patient wearing?: Pants     Lower body assist Assist for lower body dressing: Maximal Assistance - Patient 25 - 49%     Toileting Toileting    Toileting assist Assist for toileting: Maximal Assistance - Patient 25 - 49%     Transfers Chair/bed transfer  Transfers assist     Chair/bed transfer assist level: Moderate Assistance - Patient 50 - 74%     Locomotion Ambulation   Ambulation assist   Ambulation activity did not occur: Safety/medical concerns  Assist level: 2 helpers (heavy max A of 1 and +2 w/c follow) Assistive device: Walker-rolling Max distance: 98ft   Walk 10 feet activity   Assist  Walk 10 feet activity did not occur: Safety/medical concerns  Assist level: 2 helpers (heavy max A of 1 and +2 w/c follow) Assistive device: Walker-rolling   Walk 50 feet activity   Assist Walk 50 feet with 2 turns activity did not occur: Safety/medical concerns  Walk 150 feet activity   Assist Walk 150 feet activity did not occur: Safety/medical concerns         Walk 10 feet on uneven surface  activity   Assist Walk 10 feet on uneven surfaces activity did not occur: Safety/medical concerns         Wheelchair     Assist Will patient use wheelchair at discharge?: Yes Type of Wheelchair: Manual    Wheelchair assist  level: Minimal Assistance - Patient > 75% Max wheelchair distance: 50    Wheelchair 50 feet with 2 turns activity    Assist        Assist Level: Minimal Assistance - Patient > 75%   Wheelchair 150 feet activity     Assist          Blood pressure (!) 127/96, pulse 83, temperature 98.4 F (36.9 C), resp. rate 16, height 5\' 11"  (1.803 m), weight 100.6 kg, SpO2 97 %.  Medical Problem List and Plan: 1.  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  Recommendations a 30-day cardiac event monitor as outpatient  Continue CIR PT, OT SLP  PRAFO ordered 2.  Antithrombotics: -DVT/anticoagulation: Lovenox             -antiplatelet therapy: Aspirin 325 mg and Plavix 75 mg daily x3 months then Plavix alone 3. Pain Management: Voltaren gel 4 times daily, Neurontin 300 mg 3 times daily  -LLE pain chronic with normal L hip MRI 2019, ? CVA related vs sciatic, increase gabapentin,  lumbar films show disc height loss L5-S1, no spondylolisthesis. Gabapentin increased to 600mg  tid 6/11-improved   6/19: increased to 800mg  (17/10) TID as patient still in severe pain- not drowsy from medication 4. Mood: Cymbalta 30 mg twice daily             -antipsychotic agents: Invega 3 mg daily 5. Neuropsych: This patient is capable of making decisions on his own behalf. 6. Skin/Wound Care: Routine skin checks 7. Fluids/Electrolytes/Nutrition: Routine in and outs with follow-up chemistries 8.  Diastolic congestive heart failure.  Monitor for any signs of fluid overload Filed Weights   05/04/20 0426 05/05/20 0459 05/06/20 0553  Weight: 99.3 kg 100.4 kg 100.6 kg   Baseline appear to be ~98kg----up to 100kg 6/13--does not appear volume overloaded, I/O's negative 145cc yesterday   -follow for trend, accuracy of weights   6/19: stable 9.  Diabetes  mellitus with peripheral neuropathy.  Hemoglobin A1c 7.7.  SSI.    Glucophage 500 mg twice daily prior to admission, resumed on 6/4 CBG (last 3)  Recent Labs    05/05/20 2127 05/06/20 0605 05/06/20 1156  GLUCAP 172* 126* 141*   Controlled 6/19 10.  Hypertension.  Patient on Coreg 25 mg twice daily, Norvasc 10 mg daily, hydralazine 25 mg 3 times daily, Cozaar 50 mg daily prior to admission.  Resume as needed Vitals:   05/06/20 0630 05/06/20 1407  BP: (!) 151/98 (!) 127/96  Pulse:  83  Resp:  16  Temp:  98.4 F (36.9 C)  SpO2:  97%   Amlodipine 5 started on 6/4, increase to 10 on 6/7, on Coreg 25mg  BID   Cozaar  50mg  ,   -added hydralazine 10mg  tid increased to QID on 6/14, will increase to 25mg  tid TID, monitor orthostatic sx   11.  Hyperlipidemia.  Lipitor 12.  History of  polysubstance tobacco and alcohol abuse.  Urine drug screen negative.  Provide counseling 13.  Post stroke dysphagia  D2, nectars, advance diet as tolerated   -BUN nl clinically appears adequately hydrated  LOS: 16 days A FACE TO FACE EVALUATION WAS PERFORMED  Martha Clan P Delvina Mizzell 05/06/2020, 5:27 PM

## 2020-05-07 ENCOUNTER — Inpatient Hospital Stay (HOSPITAL_COMMUNITY): Payer: Medicaid Other

## 2020-05-07 LAB — GLUCOSE, CAPILLARY
Glucose-Capillary: 123 mg/dL — ABNORMAL HIGH (ref 70–99)
Glucose-Capillary: 105 mg/dL — ABNORMAL HIGH (ref 70–99)
Glucose-Capillary: 145 mg/dL — ABNORMAL HIGH (ref 70–99)
Glucose-Capillary: 146 mg/dL — ABNORMAL HIGH (ref 70–99)

## 2020-05-07 NOTE — Progress Notes (Signed)
[place] PHYSICAL MEDICINE & REHABILITATION PROGRESS NOTE   Subjective/Complaints: Continues to have left thigh pain. Also complaining of some SOB. Vitals stable except for HTN. Appears comfortable. Will get vascular US to r/o clot though left leg pain has been chronic.   ROS: Patient denies CP, SOB, N/V/D  Objective:   No results found. No results for input(s): WBC, HGB, HCT, PLT in the last 72 hours. No results for input(s): NA, K, CL, CO2, GLUCOSE, BUN, CREATININE, CALCIUM in the last 72 hours.  Intake/Output Summary (Last 24 hours) at 05/07/2020 1636 Last data filed at 05/07/2020 1100 Gross per 24 hour  Intake 358 ml  Output 975 ml  Net -617 ml     Physical Exam: Vital Signs Blood pressure (!) 153/104, pulse 79, temperature 98.6 F (37 C), resp. rate 20, height 5\' 11"  (1.803 m), weight 99.8 kg, SpO2 100 %.    General: Alert and oriented x 3, No apparent distress HEENT: Head is normocephalic, atraumatic, PERRLA, EOMI, sclera anicteric, oral mucosa pink and moist, dentition intact, ext ear canals clear,  Neck: Supple without JVD or lymphadenopathy Heart: Reg rate and rhythm. No murmurs rubs or gallops Chest: CTA bilaterally without wheezes, rales, or rhonchi; no distress Abdomen: Soft, non-tender, non-distended, bowel sounds positive. Extremities: No clubbing, cyanosis, or edema. Pulses are 2+  Musc: mild pain with PROM LLE, no focal knee abnl today RUE: 3/5 proximal to distal, unchanged, RLE: HF, KE 3-/4 ADF 0/5, unchanged Dysarthria significant Right facial weakness  Assessment/Plan: 1. Functional deficits secondary to Left pontine infarct which require 3+ hours per day of interdisciplinary therapy in a comprehensive inpatient rehab setting.  Physiatrist is providing close team supervision and 24 hour management of active medical problems listed below.  Physiatrist and rehab team continue to assess barriers to discharge/monitor patient progress toward functional  and medical goals  Care Tool:  Bathing    Body parts bathed by patient: Right arm, Left arm, Chest, Abdomen, Front perineal area, Right upper leg, Left upper leg, Face, Left lower leg   Body parts bathed by helper: Right lower leg, Buttocks Body parts n/a: Right lower leg, Left lower leg, Buttocks (did not attempt this session)   Bathing assist Assist Level: Moderate Assistance - Patient 50 - 74%     Upper Body Dressing/Undressing Upper body dressing   What is the patient wearing?: Pull over shirt    Upper body assist Assist Level: Contact Guard/Touching assist    Lower Body Dressing/Undressing Lower body dressing      What is the patient wearing?: Pants     Lower body assist Assist for lower body dressing: Maximal Assistance - Patient 25 - 49%     Toileting Toileting    Toileting assist Assist for toileting: Maximal Assistance - Patient 25 - 49%     Transfers Chair/bed transfer  Transfers assist     Chair/bed transfer assist level: Maximal Assistance - Patient 25 - 49%     Locomotion Ambulation   Ambulation assist   Ambulation activity did not occur: Safety/medical concerns  Assist level: 2 helpers (heavy max A of 1 and +2 w/c follow) Assistive device: Walker-rolling Max distance: 59ft   Walk 10 feet activity   Assist  Walk 10 feet activity did not occur: Safety/medical concerns  Assist level: 2 helpers (heavy max A of 1 and +2 w/c follow) Assistive device: Walker-rolling   Walk 50 feet activity   Assist Walk 50 feet with 2 turns activity did not occur: Safety/medical  concerns         Walk 150 feet activity   Assist Walk 150 feet activity did not occur: Safety/medical concerns         Walk 10 feet on uneven surface  activity   Assist Walk 10 feet on uneven surfaces activity did not occur: Safety/medical concerns         Wheelchair     Assist Will patient use wheelchair at discharge?: Yes Type of Wheelchair: Manual     Wheelchair assist level: Minimal Assistance - Patient > 75% Max wheelchair distance: 50    Wheelchair 50 feet with 2 turns activity    Assist        Assist Level: Minimal Assistance - Patient > 75%   Wheelchair 150 feet activity     Assist          Blood pressure (!) 153/104, pulse 79, temperature 98.6 F (37 C), resp. rate 20, height 5\' 11"  (1.803 m), weight 99.8 kg, SpO2 100 %.  Medical Problem List and Plan: 1.  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  Recommendations a 30-day cardiac event monitor as outpatient  Continue CIR PT, OT SLP  PRAFO ordered 2.  Antithrombotics: -DVT/anticoagulation: Lovenox             -antiplatelet therapy: Aspirin 325 mg and Plavix 75 mg daily x3 months then Plavix alone 3. Pain Management: Voltaren gel 4 times daily, Neurontin 300 mg 3 times daily  -LLE pain chronic with normal L hip MRI 2019, ? CVA related vs sciatic, increase gabapentin,  lumbar films show disc height loss L5-S1, no spondylolisthesis. Gabapentin increased to 600mg  tid 6/11-improved   6/19: increased to 800mg  (17/10) TID as patient still in severe pain- not drowsy from medication  6/20: No improvement in pain with this change. Appears alert. Will order LLE [place] to assess for clot though pain has been chronic. Also complaining of some increased SOB but appears comfortably.  4. Mood: Cymbalta 30 mg twice daily             -antipsychotic agents: Invega 3 mg daily 5. Neuropsych: This patient is capable of making decisions on his own behalf. 6. Skin/Wound Care: Routine skin checks 7. Fluids/Electrolytes/Nutrition: Routine in and outs with follow-up chemistries 8.  Diastolic congestive heart failure.  Monitor for any signs of fluid overload Filed Weights   05/05/20 0459 05/06/20 0553 05/07/20 0441  Weight:  100.4 kg 100.6 kg 99.8 kg   Baseline appear to be ~98kg----up to 100kg 6/13--does not appear volume overloaded, I/O's negative 145cc yesterday   -follow for trend, accuracy of weights   6/20: improved.  9.  Diabetes mellitus with peripheral neuropathy.  Hemoglobin A1c 7.7.  SSI.    Glucophage 500 mg twice daily prior to admission, resumed on 6/4 CBG (last 3)  Recent Labs    05/06/20 2120 05/07/20 0614 05/07/20 1159  GLUCAP 130* 123* 105*  Controlled 6/20 10.  Hypertension.  Patient on Coreg 25 mg twice daily, Norvasc 10 mg daily, hydralazine 25 mg 3 times daily, Cozaar 50 mg daily prior to admission.  Resume as needed Vitals:   05/07/20 0424 05/07/20 1409  BP: (!) 136/97 (!) 153/104  Pulse: 69 79  Resp: 16 20  Temp: 97.8 F (36.6 C) 98.6 F (37 C)  SpO2: 100%    Amlodipine 5 started on 6/4,  increase to 10 on 6/7, on Coreg 25mg  BID   Cozaar  50mg  ,   -added hydralazine 10mg  tid increased to QID on 6/14, will increase to 25mg  tid TID, monitor orthostatic sx   11.  Hyperlipidemia.  Lipitor 12.  History of polysubstance tobacco and alcohol abuse.  Urine drug screen negative.  Provide counseling 13.  Post stroke dysphagia  D2, nectars, advance diet as tolerated   -BUN nl clinically appears adequately hydrated  LOS: 17 days A FACE TO FACE EVALUATION WAS PERFORMED  Clide Deutscher Thedore Pickel 05/07/2020, 4:36 PM

## 2020-05-07 NOTE — Progress Notes (Signed)
Physical Therapy Session Note  Patient Details  Name: [name] MRN: [id number] Date of Birth: 07-Apr-1961  Today's Date: 05/07/2020 PT Individual Time: 5597-4163 PT Individual Time Calculation (min): 57 min   Short Term Goals: Week 2:  PT Short Term Goal 1 (Week 2): Pt will perform supine<>sit with mod assist consistently without bed features PT Short Term Goal 1 - Progress (Week 2): Met PT Short Term Goal 2 (Week 2): Pt will perform bed<>chair transfers consistently with mod assist of 1 PT Short Term Goal 2 - Progress (Week 2): Met PT Short Term Goal 3 (Week 2): Pt will perform sit<>stands consistently with +2 mod assist PT Short Term Goal 3 - Progress (Week 2): Met PT Short Term Goal 4 (Week 2): Pt will initiate gait training PT Short Term Goal 4 - Progress (Week 2): Met Week 3:  PT Short Term Goal 1 (Week 3): Pt will perform supine<>sit with min assist consistently PT Short Term Goal 2 (Week 3): Pt will perform bed<>chair transfers with min assist consistently PT Short Term Goal 3 (Week 3): Pt will perform sit<>stands with mod assist of 1 PT Short Term Goal 4 (Week 3): Pt will ambulate at least 47f using LRAD with mod assist of 1 PT Short Term Goal 5 (Week 3): Pt will perform 235fof wheelchair propulsion with min assist  Skilled Therapeutic Interventions/Progress Updates:   Received pt supine in bed, pt agreeable to therapy, and reported pain in L hip but did not state number (premedicated). Repositioning, rest breaks, and distraction done to reduce pain levels. Pt expressed fatigue multiple times throughout session from medication and from not sleeping well. Session with emphasis on dressing, functional mobility/transfers, dynamic sitting balance/coordination, UE strengthening, midline orientation, and improved activity tolerance. Noted pt with soiled brief. Pt rolled to L and R with mod A and use of bedrails and required mod A +2 for peri-care and to don clean brief and  pants for time management purposes. Noted R LE edema and donned bilateral ted hose total A. Supine<>sitting EOB with HOB elevated and use of bedrails mod A for LE management and trunk control and cues for logroll technique. Doffed dirty scrub top and donned clean one with mod A. Pt able to maintain static sitting balance EOB with CGA fading to close supervision; noted pt with mild R lateral lean but able to self-correct with cues. Attempted to don sneakers but instead donned non-skid socks due to edema in R LE. Squat<>pivot bed<>WC to R with mod A +2 for safety with cues for hand placement and transfer technique with therapist blocking R knee. Pt required min A to reposition hips in WC. [facility] and energy conservation purposes. Pt refused standing activities today despite encouragement stating "I'll do it tomorrow, I need a break today". Therapist educated pt on importance of dynamic standing and weight bearing activities for improved LE strength and control, however pt continued to refuse. Pt requested to work on UE strengthening activities. [facility] and worked on bilateral UE strengthening on UBE on level 2 for 4 minutes forward and 4 minutes backward with supervision and emphasis on R UE grip strength with 1 rest break in between. [facility] Concluded session with pt sitting in WC, needs within reach, R UE supported on lap tray, and seatbelt alarm on.   Therapy Documentation Precautions:  Precautions Precautions:  Fall Precaution Comments: pusher, right hemiparesis Restrictions Weight Bearing Restrictions: No  Therapy/Group: Individual Therapy Alfonse Alpers PT, DPT   05/07/2020, 7:32 AM

## 2020-05-08 ENCOUNTER — Inpatient Hospital Stay (HOSPITAL_COMMUNITY): Payer: Medicaid Other

## 2020-05-08 ENCOUNTER — Inpatient Hospital Stay (HOSPITAL_COMMUNITY): Payer: Medicaid Other | Admitting: Speech Pathology

## 2020-05-08 ENCOUNTER — Inpatient Hospital Stay (HOSPITAL_COMMUNITY): Payer: Medicaid Other | Admitting: Occupational Therapy

## 2020-05-08 ENCOUNTER — Inpatient Hospital Stay (HOSPITAL_COMMUNITY): Payer: Medicaid Other | Admitting: Physical Therapy

## 2020-05-08 DIAGNOSIS — R0602 Shortness of breath: Secondary | ICD-10-CM

## 2020-05-08 DIAGNOSIS — M79609 Pain in unspecified limb: Secondary | ICD-10-CM

## 2020-05-08 LAB — GLUCOSE, CAPILLARY
Glucose-Capillary: 126 mg/dL — ABNORMAL HIGH (ref 70–99)
Glucose-Capillary: 118 mg/dL — ABNORMAL HIGH (ref 70–99)
Glucose-Capillary: 124 mg/dL — ABNORMAL HIGH (ref 70–99)
Glucose-Capillary: 137 mg/dL — ABNORMAL HIGH (ref 70–99)

## 2020-05-08 NOTE — Progress Notes (Signed)
Speech Language Pathology Daily Session Note  Patient Details  Name: Ebrima Ranta MRN: [id number] Date of Birth: [birth year]  Today's Date: 05/08/2020 SLP Individual Time: 1300-1345 SLP Individual Time Calculation (min): 45 min  Short Term Goals: Week 3: SLP Short Term Goal 1 (Week 3): Pt will consume upgraded trials of Dys 3 with efficient mastication and oral clearance and Supervision A for use of swallow strategies X2 prior to advancement. SLP Short Term Goal 2 (Week 3): Pt will consume therapeutic trials of thin liquids per water protocol with minimal overt s/sx aspiration over 3 consectutive sessions prior to upgrade. SLP Short Term Goal 3 (Week 3): Pt will utilize slow rate, overarticulation, and increased vocal intensity to achieve intelligibility at the word level with Min assist verbal cues. SLP Short Term Goal 4 (Week 3): Pt will recall daily information with Supervision cues for use of external aids. SLP Short Term Goal 5 (Week 3): Pt will detect functional errors with Min verbal/visual cues. SLP Short Term Goal 6 (Week 3): Pt will sustain attention to functional tasks with Min A cues for redirection.  Skilled Therapeutic Interventions: Pt was seen for skilled ST interventions targeting dysphagia and speech goals. Pt was more fatigued today in comparison to previous encounters. However, he was agreeable to participate and completed 3 sets of 10 EMST exercises with device set to 16 cm H2O with only Min A cues for accuracy in use of device. He also accepted ~6 oz thin H2O in accordance with water protocol with 1 immediate cough noted. Pt also accepted advanced trials of Dys 3 (mech soft) solids, and although total oral clearanace achieved, his mastication was significantly prolonged and noteworthy increase in WOB noted. Min verbal cues provided for use of rest breaks to compensate for WOB. Recommend continue current diet. [name] level of fatigue also resulted in highly unintelligible  speech at the phrase and sentence level today (~40-50%) with minimal responsiveness to Mod cues to correct verbal errors and increase intelligibility with use of slow rate and overarticulation. Pt left laying in bed with alarm set and needs within reach. Continue per current plan of care.        Pain Pain Assessment Pain Scale: 0-10 Pain Score: 0-No pain  Therapy/Group: Individual Therapy  Arbutus Leas 05/08/2020, 7:15 AM

## 2020-05-08 NOTE — Plan of Care (Signed)
  Problem: Consults Goal: RH STROKE PATIENT EDUCATION Description: See Patient Education module for education specifics  Outcome: Progressing Goal: Nutrition Consult-if indicated Description: Monitor intake with Min I assist for possible consult if needed Outcome: Progressing Goal: Diabetes Guidelines if Diabetic/Glucose > 140 Description: If diabetic or lab glucose is > 140 mg/dl - Initiate Diabetes/Hyperglycemia Guidelines & Document Interventions per MD orders Outcome: Progressing   Problem: RH BOWEL ELIMINATION Goal: RH STG MANAGE BOWEL WITH ASSISTANCE Description: STG Manage Bowel with Assistance Mod I assist  Outcome: Progressing   Problem: RH BLADDER ELIMINATION Goal: RH STG MANAGE BLADDER WITH ASSISTANCE Description: STG Manage Bladder With Assistance with Mod I assist Outcome: Progressing   Problem: RH SKIN INTEGRITY Goal: RH STG SKIN FREE OF INFECTION/BREAKDOWN Description: Skin to remain free of break down during rehab stay with min I assist Outcome: Progressing Goal: RH STG MAINTAIN SKIN INTEGRITY WITH ASSISTANCE Description: STG Maintain Skin Integrity With Assistance with Min I assist Outcome: Progressing   Problem: RH SAFETY Goal: RH STG ADHERE TO SAFETY PRECAUTIONS W/ASSISTANCE/DEVICE Description: STG Adhere to Safety Precautions With Assistance/Device Mod I assist with walker Outcome: Progressing   Problem: RH PAIN MANAGEMENT Goal: RH STG PAIN MANAGED AT OR BELOW [name] PAIN GOAL Description: Pain goal of 3 for 0-10 pain scale Outcome: Progressing

## 2020-05-08 NOTE — Progress Notes (Signed)
PHYSICAL MEDICINE & REHABILITATION PROGRESS NOTE   Subjective/Complaints:  Left thigh pain mainly with standing , chronic , L hip MRI in 2019 for similar sx , essentially negative No swelling LLE , no SOB   ROS: Patient denies CP, SOB, N/V/D  Objective:   No results found. No results for input(s): WBC, HGB, HCT, PLT in the last 72 hours. No results for input(s): NA, K, CL, CO2, GLUCOSE, BUN, CREATININE, CALCIUM in the last 72 hours.  Intake/Output Summary (Last 24 hours) at 05/08/2020 0802 Last data filed at 05/08/2020 0725 Gross per 24 hour  Intake 600 ml  Output 1050 ml  Net -450 ml     Physical Exam: Vital Signs Blood pressure (!) 171/95, pulse 79, temperature 98.8 F (37.1 C), resp. rate 18, height 5\' 11"  (1.803 m), weight 99.8 kg, SpO2 95 %.   General: No acute distress Mood and affect are appropriate Heart: Regular rate and rhythm no rubs murmurs or extra sounds Lungs: Clear to auscultation, breathing unlabored, no rales or wheezes Abdomen: Positive bowel sounds, soft nontender to palpation, nondistended Extremities: No clubbing, cyanosis, or edema Skin: No evidence of breakdown, no evidence of rash   Musc: mild pain with PROM LLE, no focal knee abnl today RUE: 3/5 proximal to distal, unchanged, RLE: HF, KE 3-/4 ADF 0/5, unchanged LLE 4/5 HF, KE ADF  + SLR LLE pain behind knee to lateral calf  Dysarthria significant Right facial weakness  Assessment/Plan: 1. Functional deficits secondary to Left pontine infarct which require 3+ hours per day of interdisciplinary therapy in a comprehensive inpatient rehab setting.  Physiatrist is providing close team supervision and 24 hour management of active medical problems listed below.  Physiatrist and rehab team continue to assess barriers to discharge/monitor patient progress toward functional and medical goals  Care Tool:  Bathing    Body parts bathed by patient: Right arm, Left arm, Chest, Abdomen,  Front perineal area, Right upper leg, Left upper leg, Face, Left lower leg   Body parts bathed by helper: Right lower leg, Buttocks Body parts n/a: Right lower leg, Left lower leg, Buttocks (did not attempt this session)   Bathing assist Assist Level: Moderate Assistance - Patient 50 - 74%     Upper Body Dressing/Undressing Upper body dressing   What is the patient wearing?: Pull over shirt    Upper body assist Assist Level: Contact Guard/Touching assist    Lower Body Dressing/Undressing Lower body dressing      What is the patient wearing?: Pants     Lower body assist Assist for lower body dressing: Maximal Assistance - Patient 25 - 49%     Toileting Toileting    Toileting assist Assist for toileting: Maximal Assistance - Patient 25 - 49%     Transfers Chair/bed transfer  Transfers assist     Chair/bed transfer assist level: Maximal Assistance - Patient 25 - 49%     Locomotion Ambulation   Ambulation assist   Ambulation activity did not occur: Safety/medical concerns  Assist level: 2 helpers (heavy max A of 1 and +2 w/c follow) Assistive device: Walker-rolling Max distance: 55ft   Walk 10 feet activity   Assist  Walk 10 feet activity did not occur: Safety/medical concerns  Assist level: 2 helpers (heavy max A of 1 and +2 w/c follow) Assistive device: Walker-rolling   Walk 50 feet activity   Assist Walk 50 feet with 2 turns activity did not occur: Safety/medical concerns  Walk 150 feet activity   Assist Walk 150 feet activity did not occur: Safety/medical concerns         Walk 10 feet on uneven surface  activity   Assist Walk 10 feet on uneven surfaces activity did not occur: Safety/medical concerns         Wheelchair     Assist Will patient use wheelchair at discharge?: Yes Type of Wheelchair: Manual    Wheelchair assist level: Minimal Assistance - Patient > 75% Max wheelchair distance: 50    Wheelchair 50  feet with 2 turns activity    Assist        Assist Level: Minimal Assistance - Patient > 75%   Wheelchair 150 feet activity     Assist          Blood pressure (!) 171/95, pulse 79, temperature 98.8 F (37.1 C), resp. rate 18, height 5\' 11"  (1.803 m), weight 99.8 kg, SpO2 95 %.  Medical Problem List and Plan: 1.  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  Recommendations a 30-day cardiac event monitor as outpatient  Continue CIR PT, OT SLP  PRAFO ordered 2.  Antithrombotics: -DVT/anticoagulation: Lovenox             -antiplatelet therapy: Aspirin 325 mg and Plavix 75 mg daily x3 months then Plavix alone 3. Pain Management: Voltaren gel 4 times daily, Neurontin 300 mg 3 times daily  -LLE pain chronic with normal L hip MRI 2019, ? CVA related vs sciatic, increase gabapentin,  lumbar films show disc height loss L5-S1, no spondylolisthesis. Gabapentin increased to 600mg  tid 6/11-improved   6/19: increased to 800mg  (17/10) TID as patient still in severe pain- not drowsy from medication  6/20: No improvement in pain with this change. Appears alert. Will order LLE [place] to assess for clot though pain has been chronic. Also complaining of some increased SOB but appears comfortably.  Non plegic limb although prior CVA affecting LLE - check Lumbar MRI, may benefit from [facility] in future but would avoid for 2 more months while on DAPT  4. Mood: Cymbalta 30 mg twice daily             -antipsychotic agents: Invega 3 mg daily 5. Neuropsych: This patient is capable of making decisions on his own behalf. 6. Skin/Wound Care: Routine skin checks 7. Fluids/Electrolytes/Nutrition: Routine in and outs with follow-up chemistries 8.  Diastolic congestive heart failure.  Monitor for any signs of fluid overload Filed Weights   05/06/20 0553  05/07/20 0441 05/08/20 0510  Weight: 100.6 kg 99.8 kg 99.8 kg   Baseline appear to be ~98kg----up to 100kg 6/13--does not appear volume overloaded, I/O's negative 145cc yesterday   -follow for trend, accuracy of weights   6/20: improved.  9.  Diabetes mellitus with peripheral neuropathy.  Hemoglobin A1c 7.7.  SSI.    Glucophage 500 mg twice daily prior to admission, resumed on 6/4 CBG (last 3)  Recent Labs    05/07/20 1709 05/07/20 2107 05/08/20 0619  GLUCAP 145* 146* 126*  Controlled 6/21 10.  Hypertension.  Patient on Coreg 25 mg twice daily, Norvasc 10 mg daily, hydralazine 25 mg 3 times daily, Cozaar 50 mg daily prior to admission.  Resume as needed Vitals:   05/07/20 1941 05/08/20 0413  BP: (!) 137/96 (!) 171/95  Pulse: 85 79  Resp: 18 18  Temp: 98.3 F (36.8 C) 98.8 F (37.1 C)  SpO2: 100% 95%   Amlodipine 5 started on 6/4, increase to 10 on 6/7, on Coreg 25mg  BID   Cozaar  50mg  ,   -added hydralazine 10mg  tid increased to QID on 6/14, will increase to 25mg  tid , labile, cont to monitor check ortho vitals   11.  Hyperlipidemia.  Lipitor 12.  History of polysubstance tobacco and alcohol abuse.  Urine drug screen negative.  Provide counseling 13.  Post stroke dysphagia  D2, nectars, advance diet as tolerated   -BUN nl clinically appears adequately hydrated  LOS: 18 days A FACE TO FACE EVALUATION WAS PERFORMED  Charlett Blake 05/08/2020, 8:02 AM

## 2020-05-08 NOTE — Progress Notes (Signed)
Venous duplex       has been completed. Preliminary results can be found under CV proc through chart review. Delpha Perko, BS, RDMS, RVT   

## 2020-05-08 NOTE — Progress Notes (Signed)
Physical Therapy Session Note  Patient Details  Name: [name] MRN: [id number] Date of Birth: [birth year]  Today's Date: 05/08/2020 PT Individual Time: 0900-1000; 1135-1205 PT Individual Time Calculation (min): 60 min , 30 min  Short Term Goals:  Week 3:  PT Short Term Goal 1 (Week 3): Pt will perform supine<>sit with min assist consistently PT Short Term Goal 2 (Week 3): Pt will perform bed<>chair transfers with min assist consistently PT Short Term Goal 3 (Week 3): Pt will perform sit<>stands with mod assist of 1 PT Short Term Goal 4 (Week 3): Pt will ambulate at least 77ft using LRAD with mod assist of 1 PT Short Term Goal 5 (Week 3): Pt will perform 65ft of wheelchair propulsion with min assist  Skilled Therapeutic Interventions/Progress Updates:  tx 1:  Pt seated in w/c.  He did not rate pain, but described sciatic type LLE pain.  Dr. Read Drivers stated that pt has L4 pathology and pain is sciatica.  W/c propulsion using bil UEs x 50' with min assist , especially for turns.  After application of theraband on R rim of w/c, propulsion velocity increased and he said it was easier but he still needed min assist to avoid veering R continually.  neuromuscular re-education via forced use, positioning for alternating reciprocal movement bil LEs, using Kinetron in sitting from w/c, level 50, x 2 minutes.  Using RLE only, with PT operating opposite footplate, x 20 cycles x 1. No increase in LLE pain. In supine, isolated R hip flexion with RLE hanging off of mat, x 10 x 2, and 5 x 2 minimal excursion bridging without use of UEs,  with tactile cues. No increase in LLE pain. W/c propulsion using bil UEs for forced use of RUE; pt has limited R shoulder extension for placing R hand on top of rim before pushing.  Squat pivot w/c > mat table, to L, using L hand on corner of mat table, to pull, decreasing pusher tendencies, min assist. Sit> supine to R with TCs for including RLE.  Mat> w/c as  above, pulling on L armrest slightly, as he transferred.  Without use of UEs, pt scooted R with tactile cues for clearing hips.  When attemtping scooting R, pt had great difficulty clearing hips.  Reaching R activity in sitting, feet unsupported, to lengthen L trunk, x 8 with good trunk activation, but increase in LLE pain.  At end of session, pt seated in w/c with needs at hand.  tx 2:  Pt asleep and difficult to awaken.  He was quite groggy during session, but participated well. He did not rate LLE pain.  In supine with HOB raised, stretching L gluts and paraspinals via L hip adduction and flexion with L foot over RLE, x 2 minutes x 3.   Pt assisted by using L hand to pull thigh up and over.  Pt stated that this was a good stretch for him.  Rolling L with max cues to include R hemibody. R sidelying> sit to L in flat bed, no rails, min assist.  Pt scooted to R on bed with use of hands and feet.  Squat pivot to R bed> w/c with min/mod assist.  At end of session, pt seated in wc with seat belt alarm set and needs at hand.       Therapy Documentation Precautions:  Precautions Precautions: Fall Precaution Comments: pusher, right hemiparesis, right hip/thigh pain Restrictions Weight Bearing Restrictions: No        Therapy/Group: Individual  Therapy  Vanesa Renier 05/08/2020, 10:12 AM

## 2020-05-08 NOTE — Progress Notes (Signed)
Occupational Therapy Session Note  Patient Details  Name: [name] MRN: [id number] Date of Birth: 24-Aug-1961  Today's Date: 05/08/2020 OT Individual Time: 2671-2458 OT Individual Time Calculation (min): 57 min    Short Term Goals: Week 3:  OT Short Term Goal 1 (Week 3): Pt will donn brief and pants with mod assist sit to stand for two consecutive sessions. OT Short Term Goal 2 (Week 3): Pt will complete UB dressing with supervision for 2 consecutive sessions. OT Short Term Goal 3 (Week 3): Pt will perform walk-in shower transfer with mod assist stand pivot. OT Short Term Goal 4 (Week 3): Pt will complete bathing sit to stand with mod assist.  Skilled Therapeutic Interventions/Progress Updates:    Pt worked on shower and dressing during session.  Mod assist for supine to sit following hemi technique of rolling to the left side and transitioning to sitting from sidelying.  He then completed mod assist squat pivot transfer to the wheelchair for transition to the shower.  Max assist was needed for stand pivot transfer to the shower bench to the left side secondary to increased pusher tendencies to the right.  He was then able to remove old clothing sit to stand with max assist and then complete shower at the same level.  Integration of the RUE was completed at a diminished level for washing the left arm as well as the RLE.  He was able to work on dressing sit to stand with max assist at the sink.  Mod instructional cueing for hand placement with sit to stand with increased fatigue and some left hip pain noted in standing.  Finished session with completion of grooming tasks at setup level for oral hygiene and combing his hair.  Finished session with pt sitting in the wheelchair with the call button and phone in reach awaiting next PT session.  Therapy Documentation Precautions:  Precautions Precautions: Fall Precaution Comments: pusher, right hemiparesis, right hip/thigh  pain Restrictions Weight Bearing Restrictions: No  Pain: Pain Assessment Pain Scale: Faces Pain Score: 4  Pain Type: Acute pain Pain Location: Hip Pain Orientation: Left Pain Descriptors / Indicators: Discomfort Pain Onset: With Activity Pain Intervention(s): Repositioned;Emotional support ADL: See Care Tool Section for some details of mobility and selfcare  Therapy/Group: Individual Therapy  Jaslene Marsteller OTR/L 05/08/2020, 8:57 AM

## 2020-05-09 ENCOUNTER — Inpatient Hospital Stay (HOSPITAL_COMMUNITY): Payer: Medicaid Other | Admitting: Speech Pathology

## 2020-05-09 ENCOUNTER — Inpatient Hospital Stay (HOSPITAL_COMMUNITY): Payer: Medicaid Other | Admitting: Occupational Therapy

## 2020-05-09 ENCOUNTER — Inpatient Hospital Stay (HOSPITAL_COMMUNITY): Payer: Medicaid Other | Admitting: Physical Therapy

## 2020-05-09 ENCOUNTER — Inpatient Hospital Stay (HOSPITAL_COMMUNITY): Payer: Medicaid Other

## 2020-05-09 LAB — CBC WITH DIFFERENTIAL/PLATELET
Abs Immature Granulocytes: 0.02 10*3/uL (ref 0.00–0.07)
Basophils Absolute: 0 10*3/uL (ref 0.0–0.1)
Basophils Relative: 0 %
Eosinophils Absolute: 0.3 10*3/uL (ref 0.0–0.5)
Eosinophils Relative: 4 %
HCT: 38.6 % — ABNORMAL LOW (ref 39.0–52.0)
Hemoglobin: 12.3 g/dL — ABNORMAL LOW (ref 13.0–17.0)
Immature Granulocytes: 0 %
Lymphocytes Relative: 22 %
Lymphs Abs: 2 10*3/uL (ref 0.7–4.0)
MCH: 27 pg (ref 26.0–34.0)
MCHC: 31.9 g/dL (ref 30.0–36.0)
MCV: 84.6 fL (ref 80.0–100.0)
Monocytes Absolute: 0.8 10*3/uL (ref 0.1–1.0)
Monocytes Relative: 9 %
Neutro Abs: 5.9 10*3/uL (ref 1.7–7.7)
Neutrophils Relative %: 65 %
Platelets: 161 10*3/uL (ref 150–400)
RBC: 4.56 MIL/uL (ref 4.22–5.81)
RDW: 12.7 % (ref 11.5–15.5)
WBC: 9 10*3/uL (ref 4.0–10.5)
nRBC: 0 % (ref 0.0–0.2)

## 2020-05-09 LAB — BASIC METABOLIC PANEL
Anion gap: 10 (ref 5–15)
BUN: 18 mg/dL (ref 6–20)
CO2: 25 mmol/L (ref 22–32)
Calcium: 9.1 mg/dL (ref 8.9–10.3)
Chloride: 100 mmol/L (ref 98–111)
Creatinine, Ser: 1.39 mg/dL — ABNORMAL HIGH (ref 0.61–1.24)
GFR calc Af Amer: >60 mL/min (ref >60)
GFR calc non Af Amer: 56 mL/min — ABNORMAL LOW (ref >60)
Glucose, Bld: 147 mg/dL — ABNORMAL HIGH (ref 70–99)
Potassium: 3.8 mmol/L (ref 3.5–5.1)
Sodium: 135 mmol/L (ref 135–145)

## 2020-05-09 LAB — GLUCOSE, CAPILLARY
Glucose-Capillary: 128 mg/dL — ABNORMAL HIGH (ref 70–99)
Glucose-Capillary: 125 mg/dL — ABNORMAL HIGH (ref 70–99)
Glucose-Capillary: 129 mg/dL — ABNORMAL HIGH (ref 70–99)
Glucose-Capillary: 131 mg/dL — ABNORMAL HIGH (ref 70–99)

## 2020-05-09 NOTE — Progress Notes (Signed)
Occupational Therapy Session Note  Patient Details  Name: [name] MRN: [id number] Date of Birth: March 14, 1961  Today's Date: 05/09/2020 OT Individual Time: 0800-0900 OT Individual Time Calculation (min): 60 min    Short Term Goals: Week 3:  OT Short Term Goal 1 (Week 3): Pt will donn brief and pants with mod assist sit to stand for two consecutive sessions. OT Short Term Goal 2 (Week 3): Pt will complete UB dressing with supervision for 2 consecutive sessions. OT Short Term Goal 3 (Week 3): Pt will perform walk-in shower transfer with mod assist stand pivot. OT Short Term Goal 4 (Week 3): Pt will complete bathing sit to stand with mod assist.  Skilled Therapeutic Interventions/Progress Updates:    Pt completed supine to sit EOB with mod assist to begin session.  He then worked on dressing sit to stand from the EOB.  Min assist for donning a pullover shirt with mod assist for donning pull up pants.  He needed max assist for donning TEDs and slip on shoes.  Mod assist was needed for transfer squat pivot to the wheelchair from the bed.  He then worked on grooming tasks of brushing his teeth and washing his hands at the sink with setup.  Took him down to the ADL apartment for practice with simulated walk-in shower transfers with use of the tub bench.  Mod assist needed for completion of tub bench transfers this session squat pivot.  Finished session with return to the room and pt left sitting up in the wheelchair with call button and phone in reach awaiting PT session.    Therapy Documentation Precautions:  Precautions Precautions: Fall Precaution Comments: pusher, right hemiparesis, right hip/thigh pain Restrictions Weight Bearing Restrictions: No Pain: Pain Assessment Pain Scale: 0-10 Pain Score: 0-No pain Faces Pain Scale: Hurts a little bit Pain Type: Acute pain Pain Location: Leg Pain Orientation: Left Pain Descriptors / Indicators: Discomfort Pain Onset: With  Activity Pain Intervention(s): Repositioned ADL: See Care Tool Section for some details of mobility and selfcare  Therapy/Group: Individual Therapy  Kaulin Chaves OTR/L 05/09/2020, 12:20 PM

## 2020-05-09 NOTE — Progress Notes (Signed)
PT reports that patient appears more tired today and was wheezing with therapy. Nursing also reports that patient has been more sleepy. Patient in bed sleeping soundly but opened eyes and responded to nurse. He shook his head no to eating supper. Lungs with rhonchi, no wheezes noted. Heart RRR. Abdomen SNT, +BS. Moved LUE and LLE to tactile cues.   Note Provigil was d/c yesterday but will rule out infectious cause. Will order UA/UCS to rule out UTI as cause of fatigue. Will also order CXR and labs for follow up.

## 2020-05-09 NOTE — Progress Notes (Signed)
Speech Language Pathology Daily Session Note  Patient Details  Name: [name] MRN: [id number] Date of Birth: [birth year]  Today's Date: 05/09/2020 SLP Individual Time: 1031-1130 SLP Individual Time Calculation (min): 59 min  Short Term Goals: Week 3: SLP Short Term Goal 1 (Week 3): Pt will consume upgraded trials of Dys 3 with efficient mastication and oral clearance and Supervision A for use of swallow strategies X2 prior to advancement. SLP Short Term Goal 2 (Week 3): Pt will consume therapeutic trials of thin liquids per water protocol with minimal overt s/sx aspiration over 3 consectutive sessions prior to upgrade. SLP Short Term Goal 3 (Week 3): Pt will utilize slow rate, overarticulation, and increased vocal intensity to achieve intelligibility at the word level with Min assist verbal cues. SLP Short Term Goal 4 (Week 3): Pt will recall daily information with Supervision cues for use of external aids. SLP Short Term Goal 5 (Week 3): Pt will detect functional errors with Min verbal/visual cues. SLP Short Term Goal 6 (Week 3): Pt will sustain attention to functional tasks with Min A cues for redirection.  Skilled Therapeutic Interventions: Pt was seen for skilled ST targeting dysphagia and cognition. After completing oral care via suction toothbrush with set up assist, pt accepted ~6oz thin H2O with 1 instance of immediate throat clearing. He consumed an additional ~4oz of thin ginger ale with 1 cough response noted. Discussed opportunity to advance back to thin liquids with pt, however he was adamant that he wants to remain on nectar thick liquids with water protocol at this time. Will continue to provide education and opportunities for advancement as appropriate while inpatient.  SLP further facilitated session with Max A to recall current medication names and functions - pt able to recall 1 general function of a medication when provided with name. Pt and SLP used list making as  compensatory aid to assist with future recall. Chunking strategy also introduced for pt to recall total number and general functions of medications, in very least, in future sessions. Due to time constraints, pt did not attempt to organize TID pill box according to this list, however will target at next available session. Of note, pt continued to present with fatigue today requiring Mod A verbal cues for use of strategies to increase intelligibility. Pt left laying in bed with alarm set and needs within reach. Continue per current plan of care.        Pain Pain Assessment Pain Scale: Faces Faces Pain Scale: No hurt  Therapy/Group: Individual Therapy  Arbutus Leas 05/09/2020, 7:13 AM

## 2020-05-09 NOTE — Progress Notes (Signed)
Physical Therapy Session Note  Patient Details  Name: [name] MRN: [id number] Date of Birth: 02-14-1961  Today's Date: 05/09/2020 PT Individual Time: [phone] and 1194-1740 PT Individual Time Calculation (min): 28 min and 38 min   Short Term Goals: Week 3:  PT Short Term Goal 1 (Week 3): Pt will perform supine<>sit with min assist consistently PT Short Term Goal 2 (Week 3): Pt will perform bed<>chair transfers with min assist consistently PT Short Term Goal 3 (Week 3): Pt will perform sit<>stands with mod assist of 1 PT Short Term Goal 4 (Week 3): Pt will ambulate at least 51ft using LRAD with mod assist of 1 PT Short Term Goal 5 (Week 3): Pt will perform 55ft of wheelchair propulsion with min assist  Skilled Therapeutic Interventions/Progress Updates:    Session 1: Pt received sitting in w/c and appears very groggy or lethargic with heavy eyelids but agreeable to therapy session. Deatra Ina, ATP, present for wheelchair evaluation. Patient, ATP, and therapist discussed patient's need for a lighter weight wheelchair with cushion and back support to provide improved pelvic and trunk alignment due to L EL radicular pain from lumbar spine, to provide adequate pressure relief as pt requires increased assist to come to stand, and to support patient for improved posture during meals based on swallowing precautions. Remainder of session focused on w/c propulsion. Attempted B UE and L LE propulsion ~75ft with mod assist but pt reports it caused increased L LE pain therefore transitioned to only B UE propulsion ~67ft with mod assist and manual facilitation for improved R UE propulsion technique. Returned to room and pt requesting to return to bed. L squat pivot w/c>EOB using L UE support on bedrail with heavy mod assist for lifting and pivoting. Sit>supine with min assist for B LE management. Pt noted to wheeze when lying down. Pt left supine in bed with needs in reach, HOB elevated, and bed  alarm on.  Session 2: Pt received supine in bed with NT assisting patient reporting he had attempted to urinate prior to her arrival but pt was unable to manage urinal resulting in his LB clothing being soiled. NT also reports that patient was excessively coughing when attempting to eat his lunch (did not complete meal prior to needing to use bathroom). Therapist assumed care of pt - he continues to appear "groggy" and very lethargic with increased dysarthria and difficulty keeping eyes open. Rolling R/L in bed using bedrails with supervision while therapist performed max assist LB clothing management and peri-care. Supine>sitting L EOB with min assist for trunk upright.  Pt continues to remain lethargic and noticed significant wheezing with labored breathing Benjie Karvonen, RN notified and present to assess. Sitting EOB threaded LEs through pants max assist and pt having repeated posterior lean/LOB requiring max assist to return to midline. Sit>stand from EOB with +2 mod assist for balance and lifting due to R lean and lack of adequate trunk/hip extension while therapist and +2 assist pulled pants over hips total assist. R squat pivot EOB>w/c with +2 mod assist for lifting and pivoting hips. Pt requesting to finish meal - therapist spoke with Junie Panning, SLP and notified her of what NTs reported and she inquired if patient was awake enough to eat safely. Pt demonstrated ability to eat ~3bites with no episodes of coughing and appears slightly more alert while sitting in w/c though continues to be lethargic. Benjie Karvonen, RN assumed care of patient to provide full supervision of meal and therapist updated her on the conversations  with SLP. Therapist notified Pam, PA of pt's increased lethargy and wheezing during session.  Therapy Documentation Precautions:  Precautions Precautions: Fall Precaution Comments: pusher, right hemiparesis, right hip/thigh pain Restrictions Weight Bearing Restrictions: No  Pain: Session 1:  Continues to report some L hip pain - modified interventions for pain management.  Session 2: Continues to report some L hip pain  - modified interventions for pain management.    Therapy/Group: Individual Therapy  Tawana Scale, PT, DPT 05/09/2020, 7:55 AM

## 2020-05-09 NOTE — Progress Notes (Signed)
[place] PHYSICAL MEDICINE & REHABILITATION PROGRESS NOTE   Subjective/Complaints: Still with LLE worsened with ext of leg and standing   Discussed MRI results, vasc study neg  Discussed WC needs with PT   ROS: Patient denies CP, SOB, N/V/D  Objective:   MR LUMBAR SPINE WO CONTRAST  Result Date: 05/08/2020 CLINICAL DATA:  Left thigh pain EXAM: MRI LUMBAR SPINE WITHOUT CONTRAST TECHNIQUE: Multiplanar, multisequence MR imaging of the lumbar spine was performed. No intravenous contrast was administered. COMPARISON:  None. FINDINGS: Segmentation:  Standard. Alignment:  Mild retrolisthesis at L2-L3. Vertebrae: No evidence of recent compression deformity. Mild degenerative endplate marrow changes. No suspicious osseous lesion. Conus medullaris and cauda equina: Conus extends to the L1 level. Conus and cauda equina appear normal. Paraspinal and other soft tissues: Unremarkable. 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 L1-L2:  No significant canal foraminal stenosis. L2-L3: Disc bulge mildly prominent epidural fat mild facet arthropathy. Moderate canal stenosis. No right foraminal stenosis. Minor left foraminal stenosis. L3-L4: Mildly prominent epidural fat. No significant canal or foraminal stenosis. L4-L5: Disc bulge with central disc protrusion, endplate osteophytes, mildly prominent epidural fat, and mild facet arthropathy. Marked canal stenosis with effacement of the lateral recesses. Mild foraminal stenosis. L5-S1: Prominent epidural fat effacing the thecal sac. No foraminal stenosis. 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 Findings are most pronounced at L4-L5 where there is marked canal stenosis and effacement of the lateral recesses. Electronically Signed   By: Macy Mis M.D.   On: 05/08/2020 17:26   VAS [place] LOWER EXTREMITY VENOUS (DVT)  Result Date: 05/08/2020  Lower Venous DVTStudy Indications:  Chronic thigh pain.  Comparison Study: none Performing Technologist: June Leap RDMS, RVT  Examination Guidelines: A complete evaluation includes B-mode imaging, spectral Doppler, color Doppler, and power Doppler as needed of all accessible portions of each vessel. Bilateral testing is considered an integral part of a complete examination. Limited examinations for reoccurring indications may be performed as noted. The reflux portion of the exam is performed with the patient in reverse Trendelenburg.  +---------+---------------+---------+-----------+----------+--------------+ LEFT     CompressibilityPhasicitySpontaneityPropertiesThrombus Aging +---------+---------------+---------+-----------+----------+--------------+ CFV      Full           Yes      Yes                                 +---------+---------------+---------+-----------+----------+--------------+ SFJ      Full                                                        +---------+---------------+---------+-----------+----------+--------------+ FV Prox  Full                                                        +---------+---------------+---------+-----------+----------+--------------+ FV Mid   Full                                                        +---------+---------------+---------+-----------+----------+--------------+  FV DistalFull                                                        +---------+---------------+---------+-----------+----------+--------------+ PFV      Full                                                        +---------+---------------+---------+-----------+----------+--------------+ POP      Full           Yes      Yes                                 +---------+---------------+---------+-----------+----------+--------------+ PTV      Full                                                         +---------+---------------+---------+-----------+----------+--------------+ PERO     Full                                                        +---------+---------------+---------+-----------+----------+--------------+     Summary: LEFT: - There is no evidence of deep vein thrombosis in the lower extremity.  - No cystic structure found in the popliteal fossa. - Ultrasound characteristics of enlarged lymph nodes noted in the groin.  *See table(s) above for measurements and observations. Electronically signed by Ruta Hinds MD on 05/08/2020 at 3:37:36 PM.    Final    No results for input(s): WBC, HGB, HCT, PLT in the last 72 hours. No results for input(s): NA, K, CL, CO2, GLUCOSE, BUN, CREATININE, CALCIUM in the last 72 hours.  Intake/Output Summary (Last 24 hours) at 05/09/2020 0816 Last data filed at 05/09/2020 0520 Gross per 24 hour  Intake 480 ml  Output 875 ml  Net -395 ml     Physical Exam: Vital Signs Blood pressure (!) 169/98, pulse 77, temperature 98.8 F (37.1 C), resp. rate 16, height 5\' 11"  (1.803 m), weight 100.7 kg, SpO2 100 %.   General: No acute distress Mood and affect are appropriate Heart: Regular rate and rhythm no rubs murmurs or extra sounds Lungs: Clear to auscultation, breathing unlabored, no rales or wheezes Abdomen: Positive bowel sounds, soft nontender to palpation, nondistended Extremities: No clubbing, cyanosis, or edema Skin: No evidence of breakdown, no evidence of rash   Musc: mild pain with PROM LLE, no focal knee abnl today RUE: 3/5 proximal to distal, unchanged, RLE: HF, KE 3-/4 ADF 0/5, unchanged LLE 4/5 HF, KE ADF  + SLR LLE pain behind knee to lateral calf  Dysarthria significant Right facial weakness  Assessment/Plan: 1. Functional deficits secondary to Left pontine infarct which require 3+ hours per day of interdisciplinary therapy in a comprehensive inpatient rehab setting.  Physiatrist is providing close team supervision and  24  hour management of active medical problems listed below.  Physiatrist and rehab team continue to assess barriers to discharge/monitor patient progress toward functional and medical goals  Care Tool:  Bathing    Body parts bathed by patient: Right arm, Left arm, Chest, Abdomen, Front perineal area, Right upper leg, Left upper leg, Face, Left lower leg, Right lower leg   Body parts bathed by helper: Buttocks Body parts n/a: Right lower leg, Left lower leg, Buttocks (did not attempt this session)   Bathing assist Assist Level: Maximal Assistance - Patient 24 - 49%     Upper Body Dressing/Undressing Upper body dressing   What is the patient wearing?: Pull over shirt    Upper body assist Assist Level: Minimal Assistance - Patient > 75%    Lower Body Dressing/Undressing Lower body dressing      What is the patient wearing?: Pants, Incontinence brief     Lower body assist Assist for lower body dressing: Maximal Assistance - Patient 25 - 49%     Toileting Toileting    Toileting assist Assist for toileting: Maximal Assistance - Patient 25 - 49%     Transfers Chair/bed transfer  Transfers assist     Chair/bed transfer assist level: Moderate Assistance - Patient 50 - 74%     Locomotion Ambulation   Ambulation assist   Ambulation activity did not occur: Safety/medical concerns  Assist level: 2 helpers (heavy max A of 1 and +2 w/c follow) Assistive device: Walker-rolling Max distance: 76ft   Walk 10 feet activity   Assist  Walk 10 feet activity did not occur: Safety/medical concerns  Assist level: 2 helpers (heavy max A of 1 and +2 w/c follow) Assistive device: Walker-rolling   Walk 50 feet activity   Assist Walk 50 feet with 2 turns activity did not occur: Safety/medical concerns         Walk 150 feet activity   Assist Walk 150 feet activity did not occur: Safety/medical concerns         Walk 10 feet on uneven surface  activity   Assist  Walk 10 feet on uneven surfaces activity did not occur: Safety/medical concerns         Wheelchair     Assist Will patient use wheelchair at discharge?: Yes Type of Wheelchair: Manual    Wheelchair assist level: Minimal Assistance - Patient > 75% Max wheelchair distance: 50    Wheelchair 50 feet with 2 turns activity    Assist        Assist Level: Minimal Assistance - Patient > 75%   Wheelchair 150 feet activity     Assist          Blood pressure (!) 169/98, pulse 77, temperature 98.8 F (37.1 C), resp. rate 16, height 5\' 11"  (1.803 m), weight 100.7 kg, SpO2 100 %.  Medical Problem List and Plan: 1.  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  Recommendations a 30-day cardiac event monitor as outpatient  Continue CIR PT, OT SLP  PRAFO ordered 2.  Antithrombotics: -DVT/anticoagulation: Lovenox             -antiplatelet therapy: Aspirin 325 mg and Plavix 75 mg daily x3 months then Plavix alone 3. Pain Management: Voltaren gel 4 times daily, Neurontin 300 mg 3 times daily  -LLE pain chronic with normal L hip MRI 2019, ?  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 Mood: Cymbalta 30 mg twice daily             -antipsychotic agents: Invega 3 mg daily 5. Neuropsych: This patient is capable of making decisions on his own behalf. 6. Skin/Wound Care: Routine skin checks 7. Fluids/Electrolytes/Nutrition: Routine in and outs with follow-up chemistries 8.  Diastolic congestive heart failure.  Monitor for any signs of fluid overload Filed Weights   05/07/20 0441 05/08/20 0510 05/09/20 0520  Weight: 99.8 kg 99.8 kg  100.7 kg   Baseline appear to be ~98kg----up to 100kg 6/13--does not appear volume overloaded, I/O's negative 145cc yesterday   -follow for trend, accuracy of weights   6/20: improved.  9.  Diabetes mellitus with peripheral neuropathy.  Hemoglobin A1c 7.7.  SSI.    Glucophage 500 mg twice daily prior to admission, resumed on 6/4 CBG (last 3)  Recent Labs    05/08/20 1708 05/08/20 1932 05/09/20 0527  GLUCAP 124* 137* 128*  Controlled 6/22 10.  Hypertension.  Patient on Coreg 25 mg twice daily, Norvasc 10 mg daily, hydralazine 25 mg 3 times daily, Cozaar 50 mg daily prior to admission.  Resume as needed Vitals:   05/08/20 2002 05/09/20 0524  BP: (!) 142/94 (!) 169/98  Pulse: 83 77  Resp: 20 16  Temp: 98.3 F (36.8 C) 98.8 F (37.1 C)  SpO2: 99% 100%   Amlodipine 5 started on 6/4, increase to 10 on 6/7, on Coreg 25mg  BID   Cozaar  50mg  ,   -added hydralazine 10mg  tid increased to QID on 6/14, will increase to 25mg  tid , labile, cont to monitor check ortho vitals   11.  Hyperlipidemia.  Lipitor 12.  History of polysubstance tobacco and alcohol abuse.  Urine drug screen negative.  Provide counseling 13.  Post stroke dysphagia  D2, nectars, advance diet as tolerated   -BUN nl clinically appears adequately hydrated  LOS: 19 days A FACE TO FACE EVALUATION WAS PERFORMED  Charlett Blake 05/09/2020, 8:16 AM

## 2020-05-10 ENCOUNTER — Inpatient Hospital Stay (HOSPITAL_COMMUNITY): Payer: Medicaid Other | Admitting: Speech Pathology

## 2020-05-10 ENCOUNTER — Inpatient Hospital Stay (HOSPITAL_COMMUNITY): Payer: Medicaid Other | Admitting: Occupational Therapy

## 2020-05-10 ENCOUNTER — Inpatient Hospital Stay (HOSPITAL_COMMUNITY): Payer: Medicaid Other | Admitting: *Deleted

## 2020-05-10 LAB — URINALYSIS, ROUTINE W REFLEX MICROSCOPIC
Bacteria, UA: NONE SEEN
Bilirubin Urine: NEGATIVE
Glucose, UA: NEGATIVE mg/dL
Hgb urine dipstick: NEGATIVE
Ketones, ur: NEGATIVE mg/dL
Leukocytes,Ua: NEGATIVE
Nitrite: NEGATIVE
Protein, ur: 100 mg/dL — AB
Specific Gravity, Urine: 1.014 (ref 1.005–1.030)
pH: 7 (ref 5.0–8.0)

## 2020-05-10 LAB — GLUCOSE, CAPILLARY
Glucose-Capillary: 130 mg/dL — ABNORMAL HIGH (ref 70–99)
Glucose-Capillary: 120 mg/dL — ABNORMAL HIGH (ref 70–99)
Glucose-Capillary: 163 mg/dL — ABNORMAL HIGH (ref 70–99)
Glucose-Capillary: 206 mg/dL — ABNORMAL HIGH (ref 70–99)

## 2020-05-10 MED ORDER — LOSARTAN POTASSIUM 50 MG PO TABS
100.0000 mg | ORAL_TABLET | Freq: Every day | ORAL | Status: DC
  Administered 2020-05-11 – 2020-05-16 (×6): 100 mg via ORAL
  Filled 2020-05-10 (×6): qty 2

## 2020-05-10 MED ORDER — METHYLPREDNISOLONE 4 MG PO TBPK
8.0000 mg | ORAL_TABLET | Freq: Every evening | ORAL | Status: AC
  Administered 2020-05-11: 8 mg via ORAL

## 2020-05-10 MED ORDER — GABAPENTIN 300 MG PO CAPS
600.0000 mg | ORAL_CAPSULE | Freq: Three times a day (TID) | ORAL | Status: DC
  Administered 2020-05-10 – 2020-05-16 (×18): 600 mg via ORAL
  Filled 2020-05-10 (×18): qty 2

## 2020-05-10 MED ORDER — METHYLPREDNISOLONE 4 MG PO TBPK
4.0000 mg | ORAL_TABLET | ORAL | Status: AC
  Administered 2020-05-10: 4 mg via ORAL

## 2020-05-10 MED ORDER — METHYLPREDNISOLONE 4 MG PO TBPK
4.0000 mg | ORAL_TABLET | Freq: Three times a day (TID) | ORAL | Status: AC
  Administered 2020-05-11 (×3): 4 mg via ORAL

## 2020-05-10 MED ORDER — METHYLPREDNISOLONE 4 MG PO TBPK
8.0000 mg | ORAL_TABLET | Freq: Every evening | ORAL | Status: AC
  Administered 2020-05-10: 8 mg via ORAL

## 2020-05-10 MED ORDER — METHYLPREDNISOLONE 4 MG PO TBPK
8.0000 mg | ORAL_TABLET | Freq: Every morning | ORAL | Status: AC
  Administered 2020-05-10: 8 mg via ORAL
  Filled 2020-05-10 (×2): qty 21

## 2020-05-10 MED ORDER — METHYLPREDNISOLONE 4 MG PO TBPK
4.0000 mg | ORAL_TABLET | Freq: Four times a day (QID) | ORAL | Status: AC
  Administered 2020-05-12 – 2020-05-15 (×10): 4 mg via ORAL

## 2020-05-10 NOTE — Progress Notes (Signed)
Occupational Therapy Session Note  Patient Details  Name: [name] MRN: [id number] Date of Birth: 1961/01/17  Today's Date: 05/10/2020 OT Individual Time: 1443-1540 OT Individual Time Calculation (min): 50 min    Short Term Goals: Week 3:  OT Short Term Goal 1 (Week 3): Pt will donn brief and pants with mod assist sit to stand for two consecutive sessions. OT Short Term Goal 2 (Week 3): Pt will complete UB dressing with supervision for 2 consecutive sessions. OT Short Term Goal 3 (Week 3): Pt will perform walk-in shower transfer with mod assist stand pivot. OT Short Term Goal 4 (Week 3): Pt will complete bathing sit to stand with mod assist.  Skilled Therapeutic Interventions/Progress Updates:    Pt completed shower and dressing sit to stand during session.  Mod assist for transfer to the EOB in preparation for shower transfers.  Charlaine Dalton was then utilized for transfer to the shower with min assist for the sit to stand portion of the transfer.  He completed all UB bathing in sitting with supervision.  LB bathing at max assist secondary to sit to stand with pushing to the right noted.  Charlaine Dalton was utilized for transfer to the wheelchair for dressing tasks at the sink.  He donned a pullover shirt with mod assist.  Lower body dressing was completed sit to stand with BUE support on the sink to help with balance.  Therapist completed donning TEDs and gripper socks secondary to decreased time.  Finished session with pt staying up in the wheelchair with call button and phone in reach and safety belt in place.  Pt continues to use the RUE as an active assist during selfcare tasks for washing the LUE as well as for donning clothing with supervision to min assist.    Therapy Documentation Precautions:  Precautions Precautions: Fall Precaution Comments: pusher, right hemiparesis, right hip/thigh pain Restrictions Weight Bearing Restrictions: No  Pain: Pain Assessment Pain Scale: Faces Pain  Score: 4  Faces Pain Scale: Hurts a little bit Pain Type: Acute pain Pain Location: Hip Pain Orientation: Left Pain Descriptors / Indicators: Discomfort Pain Frequency: Constant Pain Onset: With Activity Patients Stated Pain Goal: 1 Pain Intervention(s): Repositioned ADL: See Care Tool Section for some details of mobility and selfcare  Therapy/Group: Individual Therapy  Alcario Tinkey OTR/L 05/10/2020, 10:53 AM

## 2020-05-10 NOTE — Progress Notes (Signed)
Physical Therapy Session Note  Patient Details  Name: [name] MRN: [id number] Date of Birth: 1961/06/19  Today's Date: 05/10/2020 PT Individual Time: 1057-1205 PT Individual Time Calculation (min): 68 min   Short Term Goals: Week 3:  PT Short Term Goal 1 (Week 3): Pt will perform supine<>sit with min assist consistently PT Short Term Goal 2 (Week 3): Pt will perform bed<>chair transfers with min assist consistently PT Short Term Goal 3 (Week 3): Pt will perform sit<>stands with mod assist of 1 PT Short Term Goal 4 (Week 3): Pt will ambulate at least 98f using LRAD with mod assist of 1 PT Short Term Goal 5 (Week 3): Pt will perform 23fof wheelchair propulsion with min assist  Skilled Therapeutic Interventions/Progress Updates: Pt presented in w/c agreeable to therapy. Pt states pain in LLE and when offered refused any standing activities today (becoming slightly agitated when asked). Session therefore focused on transfers, sitting balance and w/c propulsion. Pt transported to hallway and participated in w/c propulsion with BUE with PTA providing intermittent HOH assist to facilitate increased shoulder extension with RUE. Pt was able to propel approx 3534fith minA due to PTA requiring assistance for avoiding veering to R into wall. Pt then transported remaining distance to mat. Pt performed squat pivot transfer to L modA with PTA providing cues for placement of L hand and increasing anterior lean due to w/c in "dumped" position. At mat pt participated in sitting balance playing corn hole from seated position for forced use of RUE. Pt was able to increase velocity of throws and improve reaching crossing midline as well as R lateral reaching with moderate challenges. Pt was also able to score twice! Pt then participated in ball taps with moderate reaching with emphasis on returning to midline particularly with challenges that caused increased posterior lean. Pt then participated in  lateral scooting to L with cues to increase anterior lean for improved buttock clearance. Pt was able to improve scoots to L CGA with buttock clearing mat. Pt then performed squat pivot to w/c on L with min/modA. Pt then participated in reciprocal [facility] forced use of LLE 2 x 20 cycles at 50cm/sec. Pt transported back to room and performed squat pivot transfer to bed (L) in same manner as prior. Pt was able to perform posterior scoot to middle of bed with CGA. Pt then performed sit to supine with mod A and use of bed features. PTA performed glute stretch 1 min x 3 for sciatic pain. Pt positioned to comfort and left with call bell within reach and needs met.      Therapy Documentation Precautions:  Precautions Precautions: Fall Precaution Comments: pusher, right hemiparesis, right hip/thigh pain Restrictions Weight Bearing Restrictions: No General:   Vital Signs: Therapy Vitals Temp: 97.9 F (36.6 C) Pulse Rate: 86 Resp: 19 BP: (!) 156/97 Patient Position (if appropriate): Lying Oxygen Therapy SpO2: 100 % O2 Device: Room Air Pain: Pain Assessment Pain Scale: Faces Faces Pain Scale: No hurt   Therapy/Group: Individual Therapy  Shant Hence  Michaiah Holsopple, PTA  05/10/2020, 4:57 PM

## 2020-05-10 NOTE — Progress Notes (Signed)
[place] PHYSICAL MEDICINE & REHABILITATION PROGRESS NOTE   Subjective/Complaints:  No cough , no sweats or chills Still has problem with LLE sciatic pain   ROS: Patient denies CP, SOB, N/V/D  Objective:   DG Chest 2 View  Result Date: 05/09/2020 CLINICAL DATA:  Wheezing EXAM: CHEST - 2 VIEW COMPARISON:  04/17/2020 FINDINGS: The heart size and mediastinal contours are within normal limits. Both lungs are clear. Chronic deformity at the right [facility] joint. IMPRESSION: No active cardiopulmonary disease. Electronically Signed   By: Donavan Foil M.D.   On: 05/09/2020 21:51   MR LUMBAR SPINE WO CONTRAST  Result Date: 05/08/2020 CLINICAL DATA:  Left thigh pain EXAM: MRI LUMBAR SPINE WITHOUT CONTRAST TECHNIQUE: Multiplanar, multisequence MR imaging of the lumbar spine was performed. No intravenous contrast was administered. COMPARISON:  None. FINDINGS: Segmentation:  Standard. Alignment:  Mild retrolisthesis at L2-L3. Vertebrae: No evidence of recent compression deformity. Mild degenerative endplate marrow changes. No suspicious osseous lesion. Conus medullaris and cauda equina: Conus extends to the L1 level. Conus and cauda equina appear normal. Paraspinal and other soft tissues: Unremarkable. 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 L1-L2:  No significant canal foraminal stenosis. L2-L3: Disc bulge mildly prominent epidural fat mild facet arthropathy. Moderate canal stenosis. No right foraminal stenosis. Minor left foraminal stenosis. L3-L4: Mildly prominent epidural fat. No significant canal or foraminal stenosis. L4-L5: Disc bulge with central disc protrusion, endplate osteophytes, mildly prominent epidural fat, and mild facet arthropathy. Marked canal stenosis with effacement of the lateral recesses. Mild foraminal stenosis. L5-S1: Prominent epidural fat effacing the thecal sac. No foraminal stenosis. 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 Findings are most pronounced at L4-L5 where there is marked canal stenosis and effacement of the lateral recesses. Electronically Signed   By: Macy Mis M.D.   On: 05/08/2020 17:26   VAS [place] LOWER EXTREMITY VENOUS (DVT)  Result Date: 05/08/2020  Lower Venous DVTStudy Indications: Chronic thigh pain.  Comparison Study: none Performing Technologist: June Leap RDMS, RVT  Examination Guidelines: A complete evaluation includes B-mode imaging, spectral Doppler, color Doppler, and power Doppler as needed of all accessible portions of each vessel. Bilateral testing is considered an integral part of a complete examination. Limited examinations for reoccurring indications may be performed as noted. The reflux portion of the exam is performed with the patient in reverse Trendelenburg.  +---------+---------------+---------+-----------+----------+--------------+ LEFT     CompressibilityPhasicitySpontaneityPropertiesThrombus Aging +---------+---------------+---------+-----------+----------+--------------+ CFV      Full           Yes      Yes                                 +---------+---------------+---------+-----------+----------+--------------+ SFJ      Full                                                        +---------+---------------+---------+-----------+----------+--------------+ FV Prox  Full                                                        +---------+---------------+---------+-----------+----------+--------------+  FV Mid   Full                                                        +---------+---------------+---------+-----------+----------+--------------+ FV DistalFull                                                        +---------+---------------+---------+-----------+----------+--------------+ PFV      Full                                                         +---------+---------------+---------+-----------+----------+--------------+ POP      Full           Yes      Yes                                 +---------+---------------+---------+-----------+----------+--------------+ PTV      Full                                                        +---------+---------------+---------+-----------+----------+--------------+ PERO     Full                                                        +---------+---------------+---------+-----------+----------+--------------+     Summary: LEFT: - There is no evidence of deep vein thrombosis in the lower extremity.  - No cystic structure found in the popliteal fossa. - Ultrasound characteristics of enlarged lymph nodes noted in the groin.  *See table(s) above for measurements and observations. Electronically signed by Ruta [place] MD on 05/08/2020 at 3:37:36 PM.    Final    Recent Labs    05/09/20 1831  WBC 9.0  HGB 12.3*  HCT 38.6*  PLT 161   Recent Labs    05/09/20 1831  NA 135  K 3.8  CL 100  CO2 25  GLUCOSE 147*  BUN 18  CREATININE 1.39*  CALCIUM 9.1    Intake/Output Summary (Last 24 hours) at 05/10/2020 0830 Last data filed at 05/10/2020 0310 Gross per 24 hour  Intake 720 ml  Output 800 ml  Net -80 ml     Physical Exam: Vital Signs Blood pressure (!) 154/103, pulse 80, temperature 97.8 F (36.6 C), resp. rate 20, height 5\' 11"  (1.803 m), weight 98.5 kg, SpO2 99 %.   General: No acute distress Mood and affect are appropriate Heart: Regular rate and rhythm no rubs murmurs or extra sounds Lungs: Clear to auscultation, breathing unlabored, no rales or wheezes Abdomen: Positive bowel sounds, soft nontender to palpation, nondistended Extremities: No clubbing, cyanosis, or edema Skin: No evidence of  breakdown, no evidence of rash   Musc: mild pain with PROM LLE, no focal knee abnl today RUE: 3/5 proximal to distal, unchanged, RLE: HF, KE 3-/4 ADF 0/5, unchanged LLE 4/5  HF, KE ADF  + SLR LLE pain behind knee to lateral calf  Dysarthria significant Right facial weakness  Assessment/Plan: 1. Functional deficits secondary to Left pontine infarct which require 3+ hours per day of interdisciplinary therapy in a comprehensive inpatient rehab setting.  Physiatrist is providing close team supervision and 24 hour management of active medical problems listed below.  Physiatrist and rehab team continue to assess barriers to discharge/monitor patient progress toward functional and medical goals  Care Tool:  Bathing    Body parts bathed by patient: Right arm, Left arm, Chest, Abdomen, Front perineal area, Right upper leg, Left upper leg, Face, Left lower leg, Right lower leg   Body parts bathed by helper: Buttocks Body parts n/a: Right lower leg, Left lower leg, Buttocks (did not attempt this session)   Bathing assist Assist Level: Maximal Assistance - Patient 24 - 49%     Upper Body Dressing/Undressing Upper body dressing   What is the patient wearing?: Pull over shirt    Upper body assist Assist Level: Minimal Assistance - Patient > 75%    Lower Body Dressing/Undressing Lower body dressing      What is the patient wearing?: Pants, Incontinence brief     Lower body assist Assist for lower body dressing: Maximal Assistance - Patient 25 - 49%     Toileting Toileting    Toileting assist Assist for toileting: Maximal Assistance - Patient 25 - 49%     Transfers Chair/bed transfer  Transfers assist     Chair/bed transfer assist level: Moderate Assistance - Patient 50 - 74%     Locomotion Ambulation   Ambulation assist   Ambulation activity did not occur: Safety/medical concerns  Assist level: 2 helpers (heavy max A of 1 and +2 w/c follow) Assistive device: Walker-rolling Max distance: 70ft   Walk 10 feet activity   Assist  Walk 10 feet activity did not occur: Safety/medical concerns  Assist level: 2 helpers (heavy max A of 1  and +2 w/c follow) Assistive device: Walker-rolling   Walk 50 feet activity   Assist Walk 50 feet with 2 turns activity did not occur: Safety/medical concerns         Walk 150 feet activity   Assist Walk 150 feet activity did not occur: Safety/medical concerns         Walk 10 feet on uneven surface  activity   Assist Walk 10 feet on uneven surfaces activity did not occur: Safety/medical concerns         Wheelchair     Assist Will patient use wheelchair at discharge?: Yes Type of Wheelchair: Manual    Wheelchair assist level: Minimal Assistance - Patient > 75% Max wheelchair distance: 50    Wheelchair 50 feet with 2 turns activity    Assist        Assist Level: Minimal Assistance - Patient > 75%   Wheelchair 150 feet activity     Assist          Blood pressure (!) 154/103, pulse 80, temperature 97.8 F (36.6 C), resp. rate 20, height 5\' 11"  (1.803 m), weight 98.5 kg, SpO2 99 %.  Medical Problem List and Plan: 1.  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  Recommendations a 30-day cardiac event monitor as outpatient  Continue CIR PT, OT SLP  PRAFO ordered 2.  Antithrombotics: -DVT/anticoagulation: Lovenox             -antiplatelet therapy: Aspirin 325 mg and Plavix 75 mg daily x3 months then Plavix alone 3. Pain Management: Voltaren gel 4 times daily, Neurontin 800 mg 3 times daily-likely causing sedation- will reduce to 600mg  , Medrol dose pack   -LLE pain chronic with normal L hip MRI 2019, ? 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 Mood: Cymbalta 30 mg twice daily              -antipsychotic agents: Invega 3 mg daily 5. Neuropsych: This patient is capable of making decisions on his own behalf. 6. Skin/Wound Care: Routine skin checks 7. Fluids/Electrolytes/Nutrition: Routine in and outs with follow-up chemistries 8.  Diastolic congestive heart failure.  Monitor for any signs of fluid overload Filed Weights   05/08/20 0510 05/09/20 0520 05/10/20 0500  Weight: 99.8 kg 100.7 kg 98.5 kg   Baseline appear to be ~98kg----up to 100kg 6/13--does not appear volume overloaded, I/O's negative 145cc yesterday   -follow for trend, accuracy of weights   6/20: improved.  9.  Diabetes mellitus with peripheral neuropathy.  Hemoglobin A1c 7.7.  SSI.    Glucophage 500 mg twice daily prior to admission, resumed on 6/4 CBG (last 3)  Recent Labs    05/09/20 1658 05/09/20 1948 05/10/20 0617  GLUCAP 129* 131* 130*  Controlled 6/23 10.  Hypertension.  Patient on Coreg 25 mg twice daily, Norvasc 10 mg daily, hydralazine 25 mg 3 times daily, Cozaar 50 mg daily prior to admission.  Resume as needed Vitals:   05/09/20 1932 05/10/20 0328  BP: (!) 152/96 (!) 154/103  Pulse: 76 80  Resp: 20 20  Temp: 98.4 F (36.9 C) 97.8 F (36.6 C)  SpO2: 100% 99%   Amlodipine 5 started on 6/4, increase to 10 on 6/7, on Coreg 25mg  BID   Cozaar  50mg  ,increase to 100mg     -added hydralazine 10mg  tid increased to QID on 6/14, will increase to 25mg  tid , labile, cont to monitor check ortho vitals   11.  Hyperlipidemia.  Lipitor 12.  History of polysubstance tobacco and alcohol abuse.  Urine drug screen negative.  Provide counseling 13.  Post stroke dysphagia  D2, nectars, advance diet as tolerated   -BUN nl clinically appears adequately hydrated  LOS: 20 days A FACE TO FACE EVALUATION WAS PERFORMED  Charlett Blake 05/10/2020, 8:30 AM

## 2020-05-10 NOTE — Progress Notes (Signed)
Speech Language Pathology Daily Session Note  Patient Details  Name: [name] MRN: [id number] Date of Birth: [birth year]  Today's Date: 05/10/2020 SLP Individual Time: 1403-1500 SLP Individual Time Calculation (min): 57 min  Short Term Goals: Week 3: SLP Short Term Goal 1 (Week 3): Pt will consume upgraded trials of Dys 3 with efficient mastication and oral clearance and Supervision A for use of swallow strategies X2 prior to advancement. SLP Short Term Goal 2 (Week 3): Pt will consume therapeutic trials of thin liquids per water protocol with minimal overt s/sx aspiration over 3 consectutive sessions prior to upgrade. SLP Short Term Goal 3 (Week 3): Pt will utilize slow rate, overarticulation, and increased vocal intensity to achieve intelligibility at the word level with Min assist verbal cues. SLP Short Term Goal 4 (Week 3): Pt will recall daily information with Supervision cues for use of external aids. SLP Short Term Goal 5 (Week 3): Pt will detect functional errors with Min verbal/visual cues. SLP Short Term Goal 6 (Week 3): Pt will sustain attention to functional tasks with Min A cues for redirection.  Skilled Therapeutic Interventions: Pt was seen for skilled ST targeting dysphagia and cognitive goals. Pt encouraged to try thin apple juice today. He consumed 8oz thin juice with 1 immediate throat clear. Pt encouraged to advance to thin liquids, however he still states he prefers nectar thick. Recommend continue current diet with water protocol in place. Pt also completed 3 sets of 10 EMST exercises with device set to 16 cm H2O resistance with only Supervision A verbal cues for accuracy in use of device. [name] self-perceived difficulty rating was 7-8 out of 10. Recommend keep device set to current level of resistance (16cm H2O). SLP further facilitated session with a mildly complex medication management task, during which pt required Min A verbal cues for sustained attention,  recall, and problem solving to demonstrated examples of how to organize a TID pill box for pills of different dosages (1X vs 2X vs 3X). Pt only required Supervision A verbal cues for error awareness. Pt left laying in bed with alarm set and needs within reach. Continue per current plan of care.        Pain Pain Assessment Pain Scale: Faces Faces Pain Scale: No hurt  Therapy/Group: Individual Therapy  Arbutus Leas 05/10/2020, 7:23 AM

## 2020-05-10 NOTE — Patient Care Conference (Signed)
Inpatient RehabilitationTeam Conference and Plan of Care Update Date: 05/10/2020   Time: 2:25 PM    Patient Name: [name]      Medical Record Number: [id number]  Date of Birth: [birth year] Sex: Male         Room/Bed: 4W08C/4W08C-01 Payor Info: Payor: MEDICAID [place] / Plan: MEDICAID [place] ACCESS / Product Type: *No Product type* /    Admit Date/Time:  04/20/2020  3:04 PM  Primary Diagnosis:  Left pontine cerebrovascular accident [facility])  Patient Active Problem List   Diagnosis Date Noted  . Chronic diastolic congestive heart failure ([place])   . Dysphagia, post-stroke   . 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[place]) 04/20/2020  . CVA (cerebral vascular accident) ([employer]) 04/18/2020  . Right sided weakness 04/17/2020  . History of CVA with residual deficit 04/17/2020  . Chronic combined systolic (congestive) and diastolic (congestive) heart failure ([place]) 04/17/2020  . Substance use disorder 04/17/2020  . Abnormal EKG 02/07/2020  . Left hip pain 02/07/2020  . AKI (acute kidney injury) ([place]) 02/07/2020  . Systolic heart failure secondary to hypertension ([place])   . Schizophrenia ([place])   . Acute systolic heart failure ([place]) 02/02/2018  . Chest pain 01/31/2018  . Hypokalemia   . Schizoaffective disorder, bipolar type ([place]) 11/22/2017  . Trauma   . Tachycardia 03/03/2017  . Alcohol abuse   . Cocaine abuse ([place])   . Hypertensive urgency   . Tachycardia   . Cocaine abuse with cocaine-induced mood disorder ([place]) 12/05/2016  . Cerebrovascular disease 09/02/2015  . 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 Malignant hypertension 08/30/2015  . Alcohol use disorder, severe, dependence ([place]) 08/30/2015  . Alcohol withdrawal ([place]) 08/30/2015  . Alcohol abuse with alcohol-induced mood disorder ([place]) 08/30/2015  . Thrombocytopenia ([place]) 08/27/2015  . Tobacco use disorder 08/26/2015  . Hyperlipidemia associated with type 2 diabetes mellitus  ([place]) 04/28/2015  . Schizoaffective disorder, depressive type ([place])   . Hypertension associated with diabetes ([place]) 08/09/2013  . Gout 08/09/2013  . Diabetes mellitus ([place]) 07/09/2012    Expected Discharge Date: Expected Discharge Date: 05/16/20  Team Members Present: Physician leading conference: Dr. Alysia Penna Care Coodinator Present: Dorien Chihuahua, RN, BSN, CRRN;Christina Sampson Goon, [place] Nurse Present: Rayne Du, LPN PT Present: Excell Seltzer, PT OT Present: Clyda Greener, OT SLP Present: Jettie Booze, CF-SLP PPS Coordinator present : Gunnar Fusi, SLP     Current Status/Progress Goal Weekly Team Focus  Bowel/Bladder   continent of bladder (does have incontinent episodes.. incontinent of bowel LBM 05/08/20  Pt will be continent and will not need bladder intervention.  assess toileting needs qshift and PRN   Swallow/Nutrition/ Hydration   Dys 2, nectar, water protocol, s/sx aspiration decreasing with thin but still occasional, encouraging pt to attempt upgrade back to thin with option to use thickener as needed as opposed to all the time  Mod I (will likely downgrade to Supervision)  tolerance current diet, continue advanced solid trials, water protocol, independence with use strategies   ADL's   supervision for UB bathing with min assist for UB dressing.  Mod assist for LB bathing and mod to max for dressing.  Still mod assist for squat pivot transfers with max assist for toileting sit to stand.  Min A overall, Mod A LB dressing  selfcare retraining, balance retraining, neuromuscular re-education, DME education transfer training,   Mobility   min/mod assist supine<>sit, max assist sit<>stand, mod assist squat pivot, attempted gait progression to RW for 44ft with heavy max assist  of 1 and +2 w/c follow but due to pain pt had limited progression with gait this week  min assist overall  activity tolerance, pain management, bed mobility, transfer training, R LE NMR, midline  orientation/decreased pushing, trunk control, pt education, gait training   Communication   Min-Mod depending on lethargy  Min A  carryover speech strategies functional phrase sentence level   Safety/Cognition/ Behavioral Observations  Min-Mod  Supervision  sustained attention (improving), basic problem solving, emergent awareness   Pain   c/o of lower back pain controlled with PRN tylenol and muscle cream  [name] pain level with remain less than 3  assess pain qshift and PRN   Skin   skin is warm, dry and intact with no breakdown  [name] skin will remain intact.  assess skin qshift and PRN    Rehab Goals Patient on target to meet rehab goals: Yes Rehab Goals Revised: Patient on target with current goals *See Care Plan and progress notes for long and short-term goals.     Barriers to Discharge  Current Status/Progress Possible Resolutions Date Resolved   Nursing                  PT  Decreased caregiver support;Home environment access/layout;Medical stability;Lack of/limited family support;Neurogenic Bowel & Bladder                 OT                  SLP                Care Coordinator Home environment access/layout 1 Step to enter On target          Discharge Planning/Teaching Needs:  Patient plans to discharge home with daughter to provide care (works at home during day)  Will schedule with DTR   Team Discussion:  Continue to note slow progress limited by radiculopathy/pain in left hip/thigh area. MD adjusting medications. HF/Wheezes with order for flutter valve recommended. Ready for family education to review current status and assistance needed/pushing right with standing.  Revisions to Treatment Plan:  RMST exercises in use as patient gets out of breath just chewing food when eating. Team recommend extension of stay to work on  attention/problem solving issues and address pushing which impairs functional status sit-stand transfer levels.    Medical Summary Current Status:  uncontrolled HTN, radicular pain, Weekly Focus/Goal: medrol dosepack  Barriers to Discharge: Medical stability   Possible Resolutions to Barriers: med management for HTN and sciatic pain   Continued Need for Acute Rehabilitation Level of Care: The patient requires daily medical management by a physician with specialized training in physical medicine and rehabilitation for the following reasons: Direction of a multidisciplinary physical rehabilitation program to maximize functional independence : Yes Medical management of patient stability for increased activity during participation in an intensive rehabilitation regime.: Yes Analysis of laboratory values and/or radiology reports with any subsequent need for medication adjustment and/or medical intervention. : Yes   I attest that I was present, lead the team conference, and concur with the assessment and plan of the team.   Dorien Chihuahua B 05/10/2020, 2:25 PM

## 2020-05-10 NOTE — Progress Notes (Signed)
Patient ID: [name], male   DOB: 29-Nov-1960, 59 y.o.   MRN: [id number]  Team Conference Report to Patient/Family  Team Conference discussion was reviewed with the patient , including goals, any changes in plan of care and target discharge date.  Patient and caregiver express understanding and are in agreement.  The patient has a target discharge date of 05/16/20. Will follow up with daughter, no answer- voicemail full.  Dyanne Iha 05/10/2020, 1:45 PM

## 2020-05-11 ENCOUNTER — Inpatient Hospital Stay (HOSPITAL_COMMUNITY): Payer: Medicaid Other | Admitting: Physical Therapy

## 2020-05-11 ENCOUNTER — Inpatient Hospital Stay (HOSPITAL_COMMUNITY): Payer: Medicaid Other | Admitting: Speech Pathology

## 2020-05-11 ENCOUNTER — Inpatient Hospital Stay (HOSPITAL_COMMUNITY): Payer: Medicaid Other | Admitting: Occupational Therapy

## 2020-05-11 LAB — GLUCOSE, CAPILLARY
Glucose-Capillary: 212 mg/dL — ABNORMAL HIGH (ref 70–99)
Glucose-Capillary: 175 mg/dL — ABNORMAL HIGH (ref 70–99)
Glucose-Capillary: 205 mg/dL — ABNORMAL HIGH (ref 70–99)
Glucose-Capillary: 200 mg/dL — ABNORMAL HIGH (ref 70–99)

## 2020-05-11 NOTE — Progress Notes (Signed)
Occupational Therapy Session Note  Patient Details  Name: [name] MRN: [id number] Date of Birth: 1961/07/20  Today's Date: 05/11/2020 OT Individual Time: 3614-4315 OT Individual Time Calculation (min): 69 min    Short Term Goals: Week 3:  OT Short Term Goal 1 (Week 3): Pt will donn brief and pants with mod assist sit to stand for two consecutive sessions. OT Short Term Goal 2 (Week 3): Pt will complete UB dressing with supervision for 2 consecutive sessions. OT Short Term Goal 3 (Week 3): Pt will perform walk-in shower transfer with mod assist stand pivot. OT Short Term Goal 4 (Week 3): Pt will complete bathing sit to stand with mod assist.  Skilled Therapeutic Interventions/Progress Updates:    Pt in wheelchair to start session.  Worked on standing with use of the RW for managing LB clothing over his hips before transitioning out of the room.  He was able to complete sit to stand and maintain static standing balance with mod assist if BUEs were supported on the walker.  Max assist for pulling pants over hips.  If he lets go of the walker with the LUE to assist with pulling up pants, he needs max assist to maintain his balance secondary to increased pushing to the right side.  Once complete, he was taken to the ortho gym for work on RUE strengthening and Brewing technologist.  He completed 2 sets of 5 mins on the UE ergonometer after completing max assist stand pivot to the chair.  The first  2 sets were completed using BUEs and resistance set on level 10 Random Program setting.  The last set was completed with isolated use of the RUE only with resistance reduced to level four. He was able to maintain RPMs with BUEs at 19-21.  With isolated use of the RUE he maintained RPMs at nine for 1 interval of 3 mins and then a second interval of 2 mins.  Next, had pt transfer to the wheelchair and the to the therapy mat with mod assist squat pivot.  He then worked on picking up and placing  1 1/2" pegs in a grid using the [place].  He was able to pick up and manipulate the pegs that had coban on them with 90% accuracy, however the pegs that did not have the coban were completed with only approximately 50% accuracy.  Increased time needed to fill up the grid with approximately 50 pegs.  Finished session with transfer back to the wheelchair and return to the room.  He was left up in the wheelchair in preparation for lunch.  Call button and phone in reach with safety belt in place.    Therapy Documentation Precautions:  Precautions Precautions: Fall Precaution Comments: pusher, right hemiparesis, right hip/thigh pain Restrictions Weight Bearing Restrictions: No  Pain: Pain Assessment Pain Scale: 0-10 Pain Score: 0-No pain Pain Type: Acute pain Pain Location: Hip Pain Orientation: Left Pain Descriptors / Indicators: Constant Pain Frequency: Constant Pain Onset: On-going Patients Stated Pain Goal: 1 Pain Intervention(s): Repositioned ADL: See Care Tool Section for some details of mobility and selfcare  Therapy/Group: Individual Therapy  Kasyn Stouffer OTR/L 05/11/2020, 12:00 PM

## 2020-05-11 NOTE — Progress Notes (Signed)
Physical Therapy Weekly Progress Note  Patient Details  Name: [name] MRN: [id number] Date of Birth: 1961/02/13  Beginning of progress report period: May 05, 2020 End of progress report period: May 11, 2020  Today's Date: 05/11/2020 PT Individual Time: 0909-1009 PT Individual Time Calculation (min): 60 min   Patient has met 1 of 5 short term goals.  Mr. Burnham has made slow progress this past week due to continued L hip pain limiting ability to progress standing and ambulation therefore have determined patient will not be a safe functional ambulator at time of discharge - long term goals modified to reflect this. He also has had increased fatigue impacting his progression to lower assistance level with other functional mobility tasks. He is performing supine<>sit with min/mod assist depending on fatigue level, squat pivot transfers with min/mod assist to L and mod assist to R, and is progressing with B UE w/c propulsion up to 41f with mod assist primarily for HWilliams Eye Institute Pcmanual facilitation of R UE propulsion. Wheelchair evaluation was completed to ensure patient has light weight wheelchair with proper seat and back support to provide improved pelvic alignment, pressure relief, and increased independence with wheelchair mobility.   Patient continues to demonstrate the following deficits muscle weakness and muscle joint tightness, decreased cardiorespiratoy endurance, impaired timing and sequencing, abnormal tone, unbalanced muscle activation, motor apraxia, decreased coordination and decreased motor planning, decreased midline orientation, decreased initiation, decreased attention, decreased awareness, decreased problem solving, decreased safety awareness, decreased memory and delayed processing and decreased sitting balance, decreased standing balance, decreased postural control and decreased balance strategies and therefore will continue to benefit from skilled PT intervention to increase  functional independence with mobility.  Patient not progressing toward long term goals.  See goal revision. Discontinued gait goal, downgraded sit<>stand transfer goal, and added wheelchair propulsion goals as patient will be discharging at wheelchair level.  Continue plan of care.  PT Short Term Goals Week 3:  PT Short Term Goal 1 (Week 3): Pt will perform supine<>sit with min assist consistently PT Short Term Goal 1 - Progress (Week 3): Progressing toward goal PT Short Term Goal 2 (Week 3): Pt will perform bed<>chair transfers with min assist consistently PT Short Term Goal 2 - Progress (Week 3): Progressing toward goal PT Short Term Goal 3 (Week 3): Pt will perform sit<>stands with mod assist of 1 PT Short Term Goal 3 - Progress (Week 3): Progressing toward goal PT Short Term Goal 4 (Week 3): Pt will ambulate at least 178fusing LRAD with mod assist of 1 PT Short Term Goal 4 - Progress (Week 3): Not met PT Short Term Goal 5 (Week 3): Pt will perform 2047ff wheelchair propulsion with min assist PT Short Term Goal 5 - Progress (Week 3): Met Week 4:  PT Short Term Goal 1 (Week 4): = to LTGs based on ELOS  Skilled Therapeutic Interventions/Progress Updates:  Ambulation/gait training;Community reintegration;DME/adaptive equipment instruction;Neuromuscular re-education;Psychosocial support;Stair training;UE/LE Strength taining/ROM;Wheelchair propulsion/positioning;Balance/vestibular training;Discharge planning;Functional electrical stimulation;Pain management;Skin care/wound management;Cognitive remediation/compensation;Functional mobility training;Therapeutic Activities;UE/LE Coordination activities;Disease management/prevention;Patient/family education;Splinting/orthotics;Therapeutic Exercise;Visual/perceptual remediation/compensation   Pt received sitting in recliner and agreeable to therapy session. Pt appears tired but much improved from yesterday and becomes more awake with activity. Pt noted  to still wheeze upon exertion during session - BBenjie KarvonenN notified. Attempted L squat pivot transfer drop-arm recliner>w/c but despite max assist pt unable to clear hips fully and pivot hips to get into w/c therefore had to use dependent stedy transfer with min assist for lifting/lowering  to/from standing. B UE w/c propulsion ~26f with mod assist via HSuncoast Endoscopy Of Sarasota LLCfacilitation for R UE movement to grasp further back on wheel and have increased propulsion strength to keep wheelchair facing forward as opposed to veering R - performed R turn without assist but required mod assist for L turn due to R UE paresis. Transported remainder of distance to therapy gym. Performed 3x block practice squat pivot transfers w/c<>EOM with pt requiring mod assist when pivoting to the R and min assist when pivoting to the L  - education on proper positioning of w/c, management of w/c brakes, and flipping armrests back for set-up of transfer. When sitting on mat table pt continues to demonstrate pusher symptoms with L hip elevated and R lateral trunk lean - cuing for correction. Simulated car transfer (sedan height) to/from w/c with pt education on needing a lower car seat height to allow him to perform squat pivot transfers - required mod assist with lifting/pivoting hips. Transported back to room in w/c. Therapist called pt's daughter, SKatharine Look and updated her on pt's CLOF, DME recommendations, and needed measurements of bed height, car seat height, and doorway widths in the home to ensure pt can safely navigate home at wheelchair level performing squat pivot transfers - CMargreta Journey SW notified to follow-up with family about this information. Pt left seated in w/c with needs in reach, RN present, and seat belt alarm on.  Therapy Documentation Precautions:  Precautions Precautions: Fall Precaution Comments: pusher, right hemiparesis, right hip/thigh pain Restrictions Weight Bearing Restrictions: No  Pain:   Continues to report L hip pain  and declines any standing activities due to this - provided seated rest breaks and modified interventions as able for pain management.   Therapy/Group: Individual Therapy  CTawana Scale PT, DPT 05/11/2020, 7:56 AM

## 2020-05-11 NOTE — Progress Notes (Signed)
Speech Language Pathology Daily Session Note  Patient Details  Name: [name] MRN: [id number] Date of Birth: [birth year]  Today's Date: 05/11/2020 SLP Individual Time: 0730-0829 SLP Individual Time Calculation (min): 59 min  Short Term Goals: Week 3: SLP Short Term Goal 1 (Week 3): Pt will consume upgraded trials of Dys 3 with efficient mastication and oral clearance and Supervision A for use of swallow strategies X2 prior to advancement. SLP Short Term Goal 2 (Week 3): Pt will consume therapeutic trials of thin liquids per water protocol with minimal overt s/sx aspiration over 3 consectutive sessions prior to upgrade. SLP Short Term Goal 3 (Week 3): Pt will utilize slow rate, overarticulation, and increased vocal intensity to achieve intelligibility at the word level with Min assist verbal cues. SLP Short Term Goal 4 (Week 3): Pt will recall daily information with Supervision cues for use of external aids. SLP Short Term Goal 5 (Week 3): Pt will detect functional errors with Min verbal/visual cues. SLP Short Term Goal 6 (Week 3): Pt will sustain attention to functional tasks with Min A cues for redirection.  Skilled Therapeutic Interventions: Pt was seen for skilled ST targeting dysphagia and speech goals. SLP provided upgraded thin apple juice trials with pt's current dysphagia 2 (minced/ground) texture breakfast tray. Pt required Supervision A verbal cues to utilize smaller bites and rest breaks during his meal in order to compensate for increased WOB/wheezing that progresses throughout meals (due to decreased respiratory status and difficulty coordinating breathing and swallowing sequence). Pt exhibited 1 subtle cough during meal consumption, however did not appear directly linked to PO intake, but more likely due to decreased respiratory support. Recommend pt continue Dysphagia 2 (minced/ground) solid textures, but upgrade to thin liquids. Please note that pt may have nectar thick  liquids if he requests them.  SLP further facilitated session with a phrase and sentence level barrier speech task. Pt used slow rate and overarticulation throughout task to achieve ~60% intelligibility, moderately improved since last 2 visits. Pt continues to require increased cueing for use of strategies when communicating more informally, outside of structured speech tasks. Pt left sitting in chair with alarm set and needs within reach. Continue per current plan of care.        Pain Pain Assessment Pain Scale: 0-10 Pain Score: 0-No pain   Therapy/Group: Individual Therapy  Arbutus Leas 05/11/2020, 6:56 AM

## 2020-05-11 NOTE — Plan of Care (Signed)
  Problem: RH Ambulation Goal: LTG Patient will ambulate in controlled environment (PT) Description: LTG: Patient will ambulate in a controlled environment, # of feet with assistance (PT). Outcome: Not Applicable Flowsheets (Taken 05/11/2020 2210) LTG: Pt will ambulate in controlled environ  assist needed:: (discontinued goal as pt will not be a functional ambulator) -- Note: discontinued goal as pt will not be a functional ambulator    Problem: Sit to Stand Goal: LTG:  Patient will perform sit to stand with assistance level (PT) Description: LTG:  Patient will perform sit to stand with assistance level (PT) Flowsheets (Taken 05/11/2020 2210) LTG: PT will perform sit to stand in preparation for functional mobility with assistance level: (downgraded based on pt progress) Moderate Assistance - Patient 50 - 74% Note: downgraded based on pt progress   Problem: RH Wheelchair Mobility Goal: LTG Patient will propel w/c in controlled environment (PT) Description: LTG: Patient will propel wheelchair in controlled environment, # of feet with assist (PT) Flowsheets (Taken 05/11/2020 2213) LTG: Pt will propel w/c in controlled environ  assist needed:: (added goal as pt discharging at wheelchair level) Moderate Assistance - Patient 50 - 74% LTG: Propel w/c distance in controlled environment: 154ft Note: added goal as pt discharging at wheelchair level   Problem: RH Wheelchair Mobility Goal: LTG Patient will propel w/c in home environment (PT) Description: LTG: Patient will propel wheelchair in home environment, # of feet with assistance (PT). Flowsheets (Taken 05/11/2020 2213) LTG: Pt will propel w/c in home environ  assist needed:: (added goal as pt discharging at wheelchair level) Maximal Assistance - Patient 25 - 49% LTG: Propel w/c distance in home environment: 62ft Note: added goal as pt discharging at wheelchair level

## 2020-05-11 NOTE — Progress Notes (Signed)
Patient ID: [name], male   DOB: [birth year], 59 y.o.   MRN: [id number]   Sw called daughter to get car type and door measurements no answer will follow up.  Katharine Look: [phone]

## 2020-05-11 NOTE — Plan of Care (Signed)
°  Problem: RH Swallowing Goal: LTG Patient will consume least restrictive diet using compensatory strategies with assistance (SLP) Description: LTG:  Patient will consume least restrictive diet using compensatory strategies with assistance (SLP) Flowsheets (Taken 05/11/2020 1519) LTG: Pt Patient will consume least restrictive diet using compensatory strategies with assistance of (SLP): Supervision Goal: LTG Patient will participate in dysphagia therapy to increase swallow function with assistance (SLP) Description: LTG:  Patient will participate in dysphagia therapy to increase swallow function with assistance (SLP) Flowsheets (Taken 05/11/2020 1519) LTG: Pt will participate in dysphagia therapy to increase swallow function with assistance of (SLP): Supervision   Problem: RH Problem Solving Goal: LTG Patient will demonstrate problem solving for (SLP) Description: LTG:  Patient will demonstrate problem solving for basic/complex daily situations with cues  (SLP) Flowsheets (Taken 05/11/2020 1519) LTG Patient will demonstrate problem solving for: Minimal Assistance - Patient > 75%   Problem: RH Memory Goal: LTG Patient will use memory compensatory aids to (SLP) Description: LTG:  Patient will use memory compensatory aids to recall biographical/new, daily complex information with cues (SLP) Flowsheets (Taken 05/11/2020 1519) LTG: Patient will use memory compensatory aids to (SLP): Minimal Assistance - Patient > 75%   Problem: RH Awareness Goal: LTG: Patient will demonstrate awareness during functional activites type of (SLP) Description: LTG: Patient will demonstrate awareness during functional activites type of (SLP) Flowsheets (Taken 05/11/2020 1519) LTG: Patient will demonstrate awareness during cognitive/linguistic activities with assistance of (SLP): Minimal Assistance - Patient > 75%    Goals downgraded due to slower than anticipated progress

## 2020-05-11 NOTE — Progress Notes (Deleted)
Patient ID: [name], male   DOB: 04/13/1961, 59 y.o.   MRN: [id number]   Sw followed up with Dr. Darliss Cheney, MD. About FMLA paperwork for spouse.  MD has paperwork will follow up with of completion tomorrow.

## 2020-05-11 NOTE — Progress Notes (Signed)
[facility] PHYSICAL MEDICINE & REHABILITATION PROGRESS NOTE   Subjective/Complaints: LLE pain improved , able to move leg better without pain, remains severely dysarthric but responds to cueing    ROS: Patient denies CP, SOB, N/V/D  Objective:   DG Chest 2 View  Result Date: 05/09/2020 CLINICAL DATA:  Wheezing EXAM: CHEST - 2 VIEW COMPARISON:  04/17/2020 FINDINGS: The heart size and mediastinal contours are within normal limits. Both lungs are clear. Chronic deformity at the right [facility] joint. IMPRESSION: No active cardiopulmonary disease. Electronically Signed   By: Donavan Foil M.D.   On: 05/09/2020 21:51   Recent Labs    05/09/20 1831  WBC 9.0  HGB 12.3*  HCT 38.6*  PLT 161   Recent Labs    05/09/20 1831  NA 135  K 3.8  CL 100  CO2 25  GLUCOSE 147*  BUN 18  CREATININE 1.39*  CALCIUM 9.1    Intake/Output Summary (Last 24 hours) at 05/11/2020 0729 Last data filed at 05/10/2020 1300 Gross per 24 hour  Intake 280 ml  Output --  Net 280 ml     Physical Exam: Vital Signs Blood pressure (!) 156/101, pulse 98, temperature 97.9 F (36.6 C), temperature source Oral, resp. rate 20, height 5\' 11"  (1.803 m), weight 99.2 kg, SpO2 97 %.  General: No acute distress Mood and affect are appropriate Heart: Regular rate and rhythm no rubs murmurs or extra sounds Lungs: Clear to auscultation, breathing unlabored, no rales or wheezes Abdomen: Positive bowel sounds, soft nontender to palpation, nondistended Extremities: No clubbing, cyanosis, or edema Skin: No evidence of breakdown, no evidence of rash  Musc: mild pain with PROM LLE, no focal knee abnl today RUE: 3/5 proximal to distal, unchanged, RLE: HF, KE 3-/4 ADF 0/5, unchanged LLE 4/5 HF, KE ADF  + SLR LLE pain behind knee to lateral calf  Dysarthria significant Right facial weakness  Assessment/Plan: 1. Functional deficits secondary to Left pontine infarct which require 3+ hours per day of interdisciplinary therapy in  a comprehensive inpatient rehab setting.  Physiatrist is providing close team supervision and 24 hour management of active medical problems listed below.  Physiatrist and rehab team continue to assess barriers to discharge/monitor patient progress toward functional and medical goals  Care Tool:  Bathing    Body parts bathed by patient: Right arm, Left arm, Chest, Abdomen, Front perineal area, Right upper leg, Left upper leg, Face, Left lower leg, Right lower leg   Body parts bathed by helper: Buttocks Body parts n/a: Right lower leg, Left lower leg, Buttocks (did not attempt this session)   Bathing assist Assist Level: Maximal Assistance - Patient 24 - 49%     Upper Body Dressing/Undressing Upper body dressing   What is the patient wearing?: Pull over shirt    Upper body assist Assist Level: Moderate Assistance - Patient 50 - 74%    Lower Body Dressing/Undressing Lower body dressing      What is the patient wearing?: Pants, Incontinence brief     Lower body assist Assist for lower body dressing: Maximal Assistance - Patient 25 - 49%     Toileting Toileting    Toileting assist Assist for toileting: Maximal Assistance - Patient 25 - 49%     Transfers Chair/bed transfer  Transfers assist     Chair/bed transfer assist level: Dependent - mechanical lift (Stedy secondary to decreased time and pt transitioning to the shower)     Locomotion Ambulation   Ambulation assist  Ambulation activity did not occur: Safety/medical concerns  Assist level: 2 helpers (heavy max A of 1 and +2 w/c follow) Assistive device: Walker-rolling Max distance: 55ft   Walk 10 feet activity   Assist  Walk 10 feet activity did not occur: Safety/medical concerns  Assist level: 2 helpers (heavy max A of 1 and +2 w/c follow) Assistive device: Walker-rolling   Walk 50 feet activity   Assist Walk 50 feet with 2 turns activity did not occur: Safety/medical concerns         Walk  150 feet activity   Assist Walk 150 feet activity did not occur: Safety/medical concerns         Walk 10 feet on uneven surface  activity   Assist Walk 10 feet on uneven surfaces activity did not occur: Safety/medical concerns         Wheelchair     Assist Will patient use wheelchair at discharge?: Yes Type of Wheelchair: Manual    Wheelchair assist level: Minimal Assistance - Patient > 75% Max wheelchair distance: 50    Wheelchair 50 feet with 2 turns activity    Assist        Assist Level: Minimal Assistance - Patient > 75%   Wheelchair 150 feet activity     Assist          Blood pressure (!) 156/101, pulse 98, temperature 97.9 F (36.6 C), temperature source Oral, resp. rate 20, height 5\' 11"  (1.803 m), weight 99.2 kg, SpO2 97 %.  Medical Problem List and Plan: 1.  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  Recommendations a 30-day cardiac event monitor as outpatient  Continue CIR PT, OT SLP  PRAFO ordered 2.  Antithrombotics: -DVT/anticoagulation: Lovenox             -antiplatelet therapy: Aspirin 325 mg and Plavix 75 mg daily x3 months then Plavix alone 3. Pain Management: Voltaren gel 4 times daily, Neurontin 800 mg 3 times daily-likely causing sedation- will reduce to 600mg  , Medrol dose pack   -LLE pain chronic with normal L hip MRI 2019, ? CVA related vs sciatic, increase gabapentin,  lumbar films show disc height loss L5-S1, no spondylolisthesis.Central canal stenosis at L4-5  On 6/21 Lumbar MRI, may benefit from [facility] in future but would avoid for 2 more months while on DAPT  4. Mood: Cymbalta 30 mg twice daily             -antipsychotic agents: Invega 3 mg daily 5. Neuropsych: This patient is capable of making decisions on his own behalf. 6. Skin/Wound Care: Routine skin  checks 7. Fluids/Electrolytes/Nutrition: Routine in and outs with follow-up chemistries 8.  Diastolic congestive heart failure.  Monitor for any signs of fluid overload Filed Weights   05/09/20 0520 05/10/20 0500 05/11/20 0504  Weight: 100.7 kg 98.5 kg 99.2 kg   Baseline appear to be ~98kg----up to 100kg 6/13--does not appear volume overloaded, I/O's negative 145cc yesterday   -follow for trend, accuracy of weights   6/20: improved.  9.  Diabetes mellitus with peripheral neuropathy.  Hemoglobin A1c 7.7.  SSI.    Glucophage 500 mg twice daily prior to admission, resumed on 6/4 CBG (last 3)  Recent Labs    05/10/20 1633 05/10/20 2111 05/11/20 0620  GLUCAP 163* 206* 212*  Controlled 6/24 10.  Hypertension.  Patient on Coreg 25 mg  twice daily, Norvasc 10 mg daily, hydralazine 25 mg 3 times daily, Cozaar 50 mg daily prior to admission.  Resume as needed Vitals:   05/10/20 2049 05/11/20 0504  BP: (!) 140/99 (!) 156/101  Pulse: 96 98  Resp: 20 20  Temp: 99.7 F (37.6 C) 97.9 F (36.6 C)  SpO2: 96% 97%   Amlodipine 5 started on 6/4, increase to 10 on 6/7, on Coreg 25mg  BID   Cozaar  50mg  ,increase to 100mg  as of 6/24   -added hydralazine 10mg  tid increased to QID on 6/14, will increase to 25mg  tid , labile, cont to monitor check ortho vitals   11.  Hyperlipidemia.  Lipitor 12.  History of polysubstance tobacco and alcohol abuse.  Urine drug screen negative.  Provide counseling 13.  Post stroke dysphagia  D2, nectars, advance diet as tolerated   -BUN nl clinically appears adequately hydrated  LOS: 21 days A FACE TO FACE EVALUATION WAS PERFORMED  [name] 05/11/2020, 7:29 AM

## 2020-05-12 ENCOUNTER — Inpatient Hospital Stay (HOSPITAL_COMMUNITY): Payer: Medicaid Other | Admitting: Occupational Therapy

## 2020-05-12 ENCOUNTER — Inpatient Hospital Stay (HOSPITAL_COMMUNITY): Payer: Medicaid Other | Admitting: Speech Pathology

## 2020-05-12 ENCOUNTER — Inpatient Hospital Stay (HOSPITAL_COMMUNITY): Payer: Medicaid Other

## 2020-05-12 ENCOUNTER — Inpatient Hospital Stay (HOSPITAL_COMMUNITY): Payer: Medicaid Other | Admitting: Physical Therapy

## 2020-05-12 LAB — URINE CULTURE: Culture: 30000 — AB

## 2020-05-12 LAB — GLUCOSE, CAPILLARY
Glucose-Capillary: 258 mg/dL — ABNORMAL HIGH (ref 70–99)
Glucose-Capillary: 201 mg/dL — ABNORMAL HIGH (ref 70–99)
Glucose-Capillary: 239 mg/dL — ABNORMAL HIGH (ref 70–99)
Glucose-Capillary: 298 mg/dL — ABNORMAL HIGH (ref 70–99)

## 2020-05-12 NOTE — Progress Notes (Signed)
Occupational Therapy Session Note  Patient Details  Name: [name] MRN: [id number] Date of Birth: 09-28-1961  Today's Date: 05/12/2020 OT Individual Time: 1415-1436 OT Individual Time Calculation (min): 21 min    Short Term Goals: Week 4:  OT Short Term Goal 1 (Week 4): Continue working on established LTGs at [employer].  Skilled Therapeutic Interventions/Progress Updates:    Pt missed 10 mins of session to start secondary to being gone for CT scan.  Had him work on transition to sitting from supine with mod assist on the left side of the bed with use of the bed rail.  Once in sitting, he was able to maintain his balance with supervision.  Next, had him transfer over to the wheelchair squat pivot with max assist to the right side.  As he reached out with the RUE for the wheelchair armrest, he ended up pushing the wheelchair away from him, making for a not so safe transfer.  Therapist had to provide max assist to reposition himself into the chair.  He then worked on standing at the sink for adjustment of his pants.  Mod assist was needed for sit to stand and standing balance while therapist helped to adjust his pants.  He finished session with short distance wheelchair mobility of approximately 42' with overall min assist and increased time, using the UEs only.  Finished session with pt in the wheelchair with call button and phone in reach and safety belt in place.    Therapy Documentation Precautions:  Precautions Precautions: Fall Precaution Comments: pusher, right hemiparesis, right hip/thigh pain Restrictions Weight Bearing Restrictions: No  Pain: Pain Assessment Pain Scale: Faces Pain Score: 0-No pain Faces Pain Scale: Hurts a little bit Pain Type: Acute pain Pain Location: Hip Pain Orientation: Left Pain Descriptors / Indicators: Discomfort Pain Onset: With Activity  Therapy/Group: Individual Therapy  Julien Berryman OTR/L 05/12/2020, 3:52 PM

## 2020-05-12 NOTE — Progress Notes (Signed)
Patient ID: [name], male   DOB: 05-26-1961, 59 y.o.   MRN: [id number]   Patient wheelchair dropped off in room

## 2020-05-12 NOTE — Progress Notes (Signed)
Occupational Therapy Weekly Progress Note  Patient Details  Name: [name] MRN: [id number] Date of Birth: [birth year]  Beginning of progress report period: May 06, 2020 End of progress report period: May 12, 2020  Today's Date: 05/12/2020 OT Individual Time: 4854-6270 OT Individual Time Calculation (min): 60 min    Patient has met 0 of 4 short term goals.  [name] continues to make slower progress than expected at this time related to selfcare independence.  He continues to need min to mod assist for UB selfcare with mod to max assist for LB selfcare sit to stand.  The amount of assist continues to fluctuate based on his fatigue level.  He still demonstrates significant pushing tendencies to the right side in sitting as well as standing.  Squat pivot transfers to various surfaces are at a mod to max assist level secondary to the pushing with max to total assist needed for stand pivot transfers.  He currently uses the RUE as an active assist for selfcare tasks for washing the left arm and donning clothing with occasional min assist.  He currently exhibits some synergy pattern at a Brunnstrum stage V level overall.  Feel he will continue to need extensive CIR level therapy in preparation for discharge home on 6/29.  Family education will need to be completed as at this time I'm unsure if family can provide safe assist at his current fluctuating level.  Goals will be downgraded to reflect his slower progress at this time.    Patient continues to demonstrate the following deficits: muscle weakness, impaired timing and sequencing, unbalanced muscle activation and decreased coordination, decreased midline orientation and decreased sitting balance, decreased standing balance, decreased postural control, hemiplegia and decreased balance strategies and therefore will continue to benefit from skilled OT intervention to enhance overall performance with BADL and Reduce care partner  burden.  Patient not progressing toward long term goals.  See goal revision..  Continue plan of care.  OT Short Term Goals Week 4:  OT Short Term Goal 1 (Week 4): Continue working on established LTGs at [employer].  Skilled Therapeutic Interventions/Progress Updates:    Pt in bed to start session.  He was able to roll to the left side without use of the rail and mod assist.  Max assist was needed for transition to sitting from left sidelying with min assist needed for initial sitting balance.  He demonstrated increased posterior pelvic tilt with increased posterior lean sitting EOB.  Transfer to the wheelchair squat pivot was completed with max assist.  Pt with decreased ability to lift his buttocks off of the bed and turn to the wheelchair this session compared to previous sessions.  Stand pivot transfer was completed for transfer into the shower with total assist.  He demonstrated increased lean to the right in sitting during bathing with overall min guard assist to complete most washing but max assist for standing with use of the grab bar for therapist to assist with washing buttocks.  He completed stand pivot transfer to the left with total assist for transition to the wheelchair.  Had pt work on dressing at the sink with mod assist for donning pullover shirt and overall max assist for donning brief and pants in standing.  Increased right lean noted in the wheelchair with pt needing max assist to get back to midline, but then could not maintain it.  He was not falling out of the wheelchair but exhibited an increased lean compared to previous sessions.  Therapist assisted with TEDs and then he was able to donn his gripper socks with mod assist.  Mod facilitation needed for crossing and maintaining the RLE over the left knee for dressing tasks.  He completed max assist squat pivot transfer to the left back to the bed to rest.  Call button and phone in reach with safety alarm in place.   Nursing and PA made aware of pt's increased fatigue and awareness this session.   Therapy Documentation Precautions:  Precautions Precautions: Fall Precaution Comments: pusher, right hemiparesis, right hip/thigh pain Restrictions Weight Bearing Restrictions: No  Pain: Pain Assessment Pain Scale: Faces Pain Score: 0-No pain ADL: See Care Tool Section for some details of mobility and selfcare  Therapy/Group: Individual Therapy  [name] OTR/L 05/12/2020, 12:54 PM

## 2020-05-12 NOTE — Progress Notes (Signed)
Patient ID: [name], male   DOB: [birth year], 59 y.o.   MRN: [id number]   Drop arm commode and tub tranfer bench ordered

## 2020-05-12 NOTE — Progress Notes (Addendum)
[facility] PHYSICAL MEDICINE & REHABILITATION PROGRESS NOTE   Subjective/Complaints: LLE pain improved on medrol taper for rqadicular sx , no new issues ROS: Patient denies CP, SOB, N/V/D  Objective:   No results found. Recent Labs    05/09/20 1831  WBC 9.0  HGB 12.3*  HCT 38.6*  PLT 161   Recent Labs    05/09/20 1831  NA 135  K 3.8  CL 100  CO2 25  GLUCOSE 147*  BUN 18  CREATININE 1.39*  CALCIUM 9.1    Intake/Output Summary (Last 24 hours) at 05/12/2020 0905 Last data filed at 05/12/2020 0800 Gross per 24 hour  Intake 436 ml  Output 1325 ml  Net -889 ml     Physical Exam: Vital Signs Blood pressure (!) 142/91, pulse 90, temperature (!) 97.5 F (36.4 C), temperature source Oral, resp. rate 19, height 5\' 11"  (1.803 m), weight 101.2 kg, SpO2 98 %.   General: No acute distress Mood and affect are appropriate Heart: Regular rate and rhythm no rubs murmurs or extra sounds Lungs: Clear to auscultation, breathing unlabored, no rales or wheezes Abdomen: Positive bowel sounds, soft nontender to palpation, nondistended Extremities: No clubbing, cyanosis, or edema Skin: No evidence of breakdown, no evidence of rash  Musc: mild pain with PROM LLE, no focal knee abnl today RUE: 3/5 proximal to distal, unchanged, RLE: HF, KE 3-/4 ADF 0/5, unchanged LLE 4/5 HF, KE ADF  + SLR LLE pain behind knee to lateral calf  Dysarthria significant Right facial weakness  Assessment/Plan: 1. Functional deficits secondary to Left pontine infarct which require 3+ hours per day of interdisciplinary therapy in a comprehensive inpatient rehab setting.  Physiatrist is providing close team supervision and 24 hour management of active medical problems listed below.  Physiatrist and rehab team continue to assess barriers to discharge/monitor patient progress toward functional and medical goals  Care Tool:  Bathing    Body parts bathed by patient: Right arm, Left arm, Chest, Abdomen,  Front perineal area, Right upper leg, Left upper leg, Face, Left lower leg, Right lower leg   Body parts bathed by helper: Buttocks Body parts n/a: Right lower leg, Left lower leg, Buttocks (did not attempt this session)   Bathing assist Assist Level: Maximal Assistance - Patient 24 - 49%     Upper Body Dressing/Undressing Upper body dressing   What is the patient wearing?: Pull over shirt    Upper body assist Assist Level: Moderate Assistance - Patient 50 - 74%    Lower Body Dressing/Undressing Lower body dressing      What is the patient wearing?: Pants, Incontinence brief     Lower body assist Assist for lower body dressing: Maximal Assistance - Patient 25 - 49%     Toileting Toileting    Toileting assist Assist for toileting: Maximal Assistance - Patient 25 - 49%     Transfers Chair/bed transfer  Transfers assist     Chair/bed transfer assist level: Moderate Assistance - Patient 50 - 74% (squat pivot)     Locomotion Ambulation   Ambulation assist   Ambulation activity did not occur: Safety/medical concerns  Assist level: 2 helpers (heavy max A of 1 and +2 w/c follow) Assistive device: Walker-rolling Max distance: 37ft   Walk 10 feet activity   Assist  Walk 10 feet activity did not occur: Safety/medical concerns  Assist level: 2 helpers (heavy max A of 1 and +2 w/c follow) Assistive device: Walker-rolling   Walk 50 feet activity  Assist Walk 50 feet with 2 turns activity did not occur: Safety/medical concerns         Walk 150 feet activity   Assist Walk 150 feet activity did not occur: Safety/medical concerns         Walk 10 feet on uneven surface  activity   Assist Walk 10 feet on uneven surfaces activity did not occur: Safety/medical concerns         Wheelchair     Assist Will patient use wheelchair at discharge?: Yes Type of Wheelchair: Manual    Wheelchair assist level: Moderate Assistance - Patient 50 - 74% Max  wheelchair distance: 70ft    Wheelchair 50 feet with 2 turns activity    Assist        Assist Level: Moderate Assistance - Patient 50 - 74%   Wheelchair 150 feet activity     Assist          Blood pressure (!) 142/91, pulse 90, temperature (!) 97.5 F (36.4 C), temperature source Oral, resp. rate 19, height 5\' 11"  (1.803 m), weight 101.2 kg, SpO2 98 %.  Medical Problem List and Plan: 1.  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  Recommendations a 30-day cardiac event monitor as outpatient  Continue CIR PT, OT SLP  PRAFO ordered 2.  Antithrombotics: -DVT/anticoagulation: Lovenox             -antiplatelet therapy: Aspirin 325 mg and Plavix 75 mg daily x3 months then Plavix alone 3. Pain Management: Voltaren gel 4 times daily, Neurontin 800 mg 3 times daily-likely causing sedation- will reduce to 600mg  , Medrol dose pack   -LLE pain chronic with normal L hip MRI 2019, ? CVA related vs sciatic, increase gabapentin,  lumbar films show disc height loss L5-S1, no spondylolisthesis.Central canal stenosis at L4-5  On 6/21 Lumbar MRI, may benefit from [facility] in future but would avoid for 2 more months while on DAPT  4. Mood: Cymbalta 30 mg twice daily             -antipsychotic agents: Invega 3 mg daily 5. Neuropsych: This patient is capable of making decisions on his own behalf. 6. Skin/Wound Care: Routine skin checks 7. Fluids/Electrolytes/Nutrition: Routine in and outs with follow-up chemistries 8.  Diastolic congestive heart failure.  Monitor for any signs of fluid overload Filed Weights   05/10/20 0500 05/11/20 0504 05/12/20 0305  Weight: 98.5 kg 99.2 kg 101.2 kg   Baseline appear to be ~98kg----up to 100kg 6/13--does not appear volume overloaded, I/O's negative 145cc yesterday   -follow for trend, accuracy of  weights   6/20: improved.  9.  Diabetes mellitus with peripheral neuropathy.  Hemoglobin A1c 7.7.  SSI.    Glucophage 500 mg twice daily prior to admission, resumed on 6/4 CBG (last 3)  Recent Labs    05/11/20 1732 05/11/20 2114 05/12/20 0614  GLUCAP 205* 200* 258*  6/25- elevated due to medrol, should improve with taper  10.  Hypertension.  Patient on Coreg 25 mg twice daily, Norvasc 10 mg daily, hydralazine 25 mg 3 times daily, Cozaar 50 mg daily prior to admission.  Resume as needed Vitals:   05/11/20 1935 05/12/20 0305  BP: 129/89 (!) 142/91  Pulse: 81 90  Resp: 20 19  Temp: 98.4 F (36.9 C) (!) 97.5 F (36.4 C)  SpO2: 97% 98%  Amlodipine 5 started on 6/4, increase to 10 on 6/7, on Coreg 25mg  BID   Cozaar  50mg  ,increase to 100mg  as of 6/24- improving    -added hydralazine 10mg  tid increased to QID on 6/14, will increase to 25mg  tid , labile, cont to monitor check ortho vitals   11.  Hyperlipidemia.  Lipitor 12.  History of polysubstance tobacco and alcohol abuse.  Urine drug screen negative.  Provide counseling 13.  Post stroke dysphagia  D2, nectars, advance diet as tolerated   -BUN nl clinically appears adequately hydrated  LOS: 22 days A FACE TO FACE EVALUATION WAS PERFORMED  Charlett Blake 05/12/2020, 9:05 AM

## 2020-05-12 NOTE — Plan of Care (Signed)
  Problem: RH Balance Goal: LTG: Patient will maintain dynamic sitting balance (OT) Description: LTG:  Patient will maintain dynamic sitting balance with assistance during activities of daily living (OT) Flowsheets (Taken 05/12/2020 1303) LTG: Pt will maintain dynamic sitting balance during ADLs with: (goal downgraded based on slower progress) Minimal Assistance - Patient > 75% Note: goal downgraded based on slower progress Goal: LTG Patient will maintain dynamic standing with ADLs (OT) Description: LTG:  Patient will maintain dynamic standing balance with assist during activities of daily living (OT)  Flowsheets (Taken 05/12/2020 1303) LTG: Pt will maintain dynamic standing balance during ADLs with: (goal downgraded based on slower progress) Maximal Assistance - Patient 25 - 49% Note: goal downgraded based on slower progress   Problem: Sit to Stand Goal: LTG:  Patient will perform sit to stand in prep for activites of daily living with assistance level (OT) Description: LTG:  Patient will perform sit to stand in prep for activites of daily living with assistance level (OT) Flowsheets (Taken 05/12/2020 1303) LTG: PT will perform sit to stand in prep for activites of daily living with assistance level: (goal downgraded based on slower progress) Moderate Assistance - Patient 50 - 74% Note: goal downgraded based on slower progress   Problem: RH Bathing Goal: LTG Patient will bathe all body parts with assist levels (OT) Description: LTG: Patient will bathe all body parts with assist levels (OT) Flowsheets (Taken 05/12/2020 1303) LTG: Pt will perform bathing with assistance level/cueing: (goal downgraded based on slower progress) Moderate Assistance - Patient 50 - 74% LTG: Position pt will perform bathing:  Sit to Stand  Shower Note: goal downgraded based on slower progress   Problem: RH Dressing Goal: LTG Patient will perform upper body dressing (OT) Description: LTG Patient will perform upper  body dressing with assist, with/without cues (OT). Flowsheets (Taken 05/12/2020 1303) LTG: Pt will perform upper body dressing with assistance level of: (goal downgraded based on slower progress) Minimal Assistance - Patient > 75% Note: goal downgraded based on slower progress   Problem: RH Toileting Goal: LTG Patient will perform toileting task (3/3 steps) with assistance level (OT) Description: LTG: Patient will perform toileting task (3/3 steps) with assistance level (OT)  Flowsheets (Taken 05/12/2020 1303) LTG: Pt will perform toileting task (3/3 steps) with assistance level: (goal downgraded based on slower progress) Moderate Assistance - Patient 50 - 74% Note: goal downgraded based on slower progress   Problem: RH Toilet Transfers Goal: LTG Patient will perform toilet transfers w/assist (OT) Description: LTG: Patient will perform toilet transfers with assist, with/without cues using equipment (OT) Flowsheets (Taken 05/12/2020 1303) LTG: Pt will perform toilet transfers with assistance level of: (goal downgraded based on slower progress) Moderate Assistance - Patient 50 - 74% Note: goal downgraded based on slower progress   Problem: RH Tub/Shower Transfers Goal: LTG Patient will perform tub/shower transfers w/assist (OT) Description: LTG: Patient will perform tub/shower transfers with assist, with/without cues using equipment (OT) Flowsheets (Taken 05/12/2020 1303) LTG: Pt will perform tub/shower stall transfers with assistance level of: (goal downgraded based on slower progress) Moderate Assistance - Patient 50 - 74% LTG: Pt will perform tub/shower transfers from: Tub/shower combination Note: goal downgraded based on slower progress

## 2020-05-12 NOTE — Progress Notes (Signed)
Physical Therapy Session Note  Patient Details  Name: [name] MRN: [id number] Date of Birth: 10-27-1961  Today's Date: 05/12/2020 PT Individual Time: 1447-1531 PT Individual Time Calculation (min): 44 min   Short Term Goals: Week 4:  PT Short Term Goal 1 (Week 4): = to LTGs based on ELOS  Skilled Therapeutic Interventions/Progress Updates:    Patient received up in WC, soaked in urine from trying to use urinal. Required MaxA for sliding board transfer to bed with second person for safety, as well as [place] for all bed mobility today. TotalA for management of TEDs, socks, and paper scrub pants, with multiple rolls each direction for doffing soiled clothing and donning fresh clothing. TotalA for cleaning. Offered transfer back to [facility] but he refuses due to pain in L hip. Returned to supine and otherwise worked on bed exercises including bridges, SLRs,and ankle pumps. Left in bed with all needs met and bed alarm active.   Therapy Documentation Precautions:  Precautions Precautions: Fall Precaution Comments: pusher, right hemiparesis, right hip/thigh pain Restrictions Weight Bearing Restrictions: No   Pain: Pain Assessment Pain Scale: Faces Pain Score: 0-No pain Faces Pain Scale: Hurts little more Pain Type: Chronic pain Pain Location: Hip Pain Orientation: Left Pain Descriptors / Indicators: Discomfort;Sore Pain Onset: On-going Patients Stated Pain Goal: 0 Pain Intervention(s): Repositioned Multiple Pain Sites: No    Therapy/Group: Individual Therapy   [name], DPT, PN1   Supplemental Physical Therapist [place]    Pager [phone] Acute Rehab Office [phone]    05/12/2020, 4:03 PM

## 2020-05-12 NOTE — Progress Notes (Signed)
Speech Language Pathology Weekly Progress and Session Note  Patient Details  Name: [name] MRN: [id number] Date of Birth: [birth year]  Beginning of progress report period: May 05, 2020 End of progress report period: May 12, 2020  Today's Date: 05/12/2020 SLP Individual Time: 1856-3149 SLP Individual Time Calculation (min): 61 min  Short Term Goals: Week 3: SLP Short Term Goal 1 (Week 3): Pt will consume upgraded trials of Dys 3 with efficient mastication and oral clearance and Supervision A for use of swallow strategies X2 prior to advancement. SLP Short Term Goal 1 - Progress (Week 3): Progressing toward goal SLP Short Term Goal 2 (Week 3): Pt will consume therapeutic trials of thin liquids per water protocol with minimal overt s/sx aspiration over 3 consectutive sessions prior to upgrade. SLP Short Term Goal 2 - Progress (Week 3): Met SLP Short Term Goal 3 (Week 3): Pt will utilize slow rate, overarticulation, and increased vocal intensity to achieve intelligibility at the word level with Min assist verbal cues. SLP Short Term Goal 3 - Progress (Week 3): Met SLP Short Term Goal 4 (Week 3): Pt will recall daily information with Supervision cues for use of external aids. SLP Short Term Goal 4 - Progress (Week 3): Progressing toward goal SLP Short Term Goal 5 (Week 3): Pt will detect functional errors with Min verbal/visual cues. SLP Short Term Goal 5 - Progress (Week 3): Met SLP Short Term Goal 6 (Week 3): Pt will sustain attention to functional tasks with Min A cues for redirection. SLP Short Term Goal 6 - Progress (Week 3): Met    New Short Term Goals: Week 4: SLP Short Term Goal 1 (Week 4): STG = LTG due to remaining length of stay  Weekly Progress Updates: Pt has made functional gains and met 4 out of 6 short term goals this reporting period. Pt is currently Min assist for basic taks due to deficits impacting his functional basic problem solving, sustained attention,  emergent awareness, and short term memory. Pt also with mod-sev dysarthria impacting his speech intelligibility at the phrase level, however can significantly increase intelligibility with use of compensatory strategies. Pt requires Min cues to use speech strategies during structured tasks and increased Mod cueing in more informal communication exchanged due to difficulty with self-monitoring. Pt is consuming dys 2 (minced) texture diet with and was advanced back to thin liquids after reduced s/sx aspiration with water protocol over last 2 weeks. Due to decreased respiratory status that requires frequent rest breaks during meals and increased WOB with PO intake, least restrictive and safest diet continues to be Dys 2 as opposed to Dys 3 due to exacerbation of respiratory symptoms when masticating Dys 3, which is largely inefifcicient at this time. Pt education is ongoing; no family has been present for ST sessions. Pt would continue to benefit from skilled ST while inpatient in order to maximize functional independence and reduce burden of care prior to discharge. Anticipate that pt will need 24/7 supervision at discharge in addition to [place] follow up at next level of care.      Intensity: Minumum of 1-2 x/day, 30 to 90 minutes Frequency: 3 to 5 out of 7 days Duration/Length of Stay: 05/16/20 Treatment/Interventions: Cognitive remediation/compensation;Cueing hierarchy;Internal/external aids;Patient/family education;Speech/Language facilitation;Functional tasks;Dysphagia/aspiration precaution training   Daily Session  Skilled Therapeutic Interventions: Pt was seen for skilled ST targeting dysphagia and cognition. SLP provided skilled observation of pt consuming current dysphagia 2 (minched/ground) lunch tray, and recently upgraded thin liquids. Only Supervision A verbal  cueing required for pt to utilize his swallow strategies, including rest breaks and small bites/sips. No overt s/sx aspiration observed  across solids or liquids. Recommend continue current diet. SLP further facilitated session with PEG board task. Pt recreated PEG board designs from pictures, ranging from basic to complex, with Supervision A verbal cues for problem solving and error awareness. He sustained attention to tasks throughout session in a controlled environment with Min A verbal cues for redirection today. He also recalled functional information such as EMST regimen with 1 verbal prompt. Pt left laying in bed with alarm set and needs within reach. Continue per current plan of care.        Pain Pain Assessment Pain Scale: 0-10 Pain Score: 0-No pain  Therapy/Group: Individual Therapy  Arbutus Leas 05/12/2020, 5:45 AM

## 2020-05-13 ENCOUNTER — Encounter (HOSPITAL_COMMUNITY): Payer: Medicaid Other | Admitting: Occupational Therapy

## 2020-05-13 ENCOUNTER — Ambulatory Visit (HOSPITAL_COMMUNITY): Payer: Medicaid Other | Admitting: Physical Therapy

## 2020-05-13 LAB — GLUCOSE, CAPILLARY
Glucose-Capillary: 209 mg/dL — ABNORMAL HIGH (ref 70–99)
Glucose-Capillary: 211 mg/dL — ABNORMAL HIGH (ref 70–99)
Glucose-Capillary: 206 mg/dL — ABNORMAL HIGH (ref 70–99)
Glucose-Capillary: 189 mg/dL — ABNORMAL HIGH (ref 70–99)

## 2020-05-13 MED ORDER — LIVING WELL WITH DIABETES BOOK
Freq: Once | Status: DC
  Filled 2020-05-13: qty 1

## 2020-05-13 NOTE — Progress Notes (Signed)
Occupational Therapy Session Note  Patient Details  Name: [name] MRN: [id number] Date of Birth: 08-Jun-1961  Today's Date: 05/13/2020 OT Individual Time: 1400-1443 OT Individual Time Calculation (min): 43 min    Short Term Goals: Week 4:  OT Short Term Goal 1 (Week 4): Continue working on established LTGs at [employer].  Skilled Therapeutic Interventions/Progress Updates:     [name] family was scheduled for education, however they did not show up during scheduled OT or PT.  Pt completed supine to sit EOB with mod assist to start session. He then worked on squat pivot transfers to the wheelchair as well as the drop arm commode.  Mod assist needed for transfer to the right for each with increased pusher tendencies to the right as well.  Had him utilize the RW for support to work on standing for family to assist with clothing management and hygiene during toileting as well.  He needed mod assist for static standing balance using he RW.  If pt tries to assist with removal of clothing with the LUE in standing, he exhibits heavier pushing to the right, requiring max assist to maintain balance.  He needed max assist for completion of stand pivot transfer to the bed from the drop arm as well.  Continued working on scooting up and down the EOB with emphasis on forward trunk flexion with head toward the left knee to help increase weightbearing over the LLE.  He was able to perform small scoots with min assist to the right, if he flexed forward enough before trying to engage his LEs.  Mod assist for scooting to the left.  He worked on standing with and without the RW as well and increasing weight shift to the left in standing.  He continues to demonstrate increased pushing to the right with increased knee flexion present in the LLE.  Mod assist is needed to maintain right knee extension in standing. He is only able to tolerate standing for approximately one minute and then needs to rest  secondary to increased left thigh pain as well as right knee pain.  Finished session with transfer back to the bed to supine with min assist.  He was left with call button and phone in reach with safety alarm in place.  Discussed with pt that family that education will need to be performed on Monday for pt to discharge on Tuesday.     Therapy Documentation Precautions:  Precautions Precautions: Fall Precaution Comments: pusher, right hemiparesis, right hip/thigh pain Restrictions Weight Bearing Restrictions: No  Pain: Pain Assessment Pain Scale: Faces Faces Pain Scale: Hurts little more Pain Type: Chronic pain Pain Location: Hip Pain Orientation: Left Pain Descriptors / Indicators: Aching;Shooting;Sore Pain Onset: On-going Patients Stated Pain Goal: 0 Pain Intervention(s): Repositioned Multiple Pain Sites: No ADL: See Care Tool Section for some details of mobility and selfcare  Therapy/Group: Individual Therapy  [name] OTR/L 05/13/2020, 3:58 PM

## 2020-05-13 NOTE — Progress Notes (Signed)
Patient and family educated on diabetes, all questions and concerns answer. They requested menu on diabetic diet and the diabetes book is order so for them to take home. Family is advised to come on Monday for family teaching. We continue to monitor.

## 2020-05-13 NOTE — Progress Notes (Signed)
Physical Therapy Session Note  Patient Details  Name: [name] MRN: [id number] Date of Birth: 1961/09/07  Today's Date: 05/13/2020 PT Individual Time: 1302-1342 PT Individual Time Calculation (min): 40 min   Short Term Goals: Week 4:  PT Short Term Goal 1 (Week 4): = to LTGs based on ELOS  Skilled Therapeutic Interventions/Progress Updates:    Patient received in bed, ready for PT however note that today was scheduled for family education BUT FAMILY NO SHOWED TODAY. Patient needed totalA for donning TEDS and hospital socks, then able to bridge to pull pants over hips with ModA. Continues to require ModA for all functional bed mobility, able to perform sliding board transfer with [place] and second person for safety but required MaxA for sequencing. Performed 3 sit to stands in the stedy with supervision-CGA but standing tolerance limited by hip pain. Able to perform squat pivot to the left with ModA for control and safety as he literally threw himself over and crash-landed on the bed/needed assistance in stabilizing. Family still had not shown up by EOS. Patient left in bed with all needs met, bed alarm active awaiting next therapist.   Therapy Documentation Precautions:  Precautions Precautions: Fall Precaution Comments: pusher, right hemiparesis, right hip/thigh pain Restrictions Weight Bearing Restrictions: No Pain: Pain Assessment Pain Scale: Faces Faces Pain Scale: Hurts little more Pain Type: Chronic pain Pain Location: Hip Pain Orientation: Left Pain Descriptors / Indicators: Aching;Shooting;Sore Pain Onset: On-going Patients Stated Pain Goal: 0 Pain Intervention(s): Repositioned Multiple Pain Sites: No    Therapy/Group: Individual Therapy   [name], DPT, PN1   Supplemental Physical Therapist [place]    Pager [phone] Acute Rehab Office [phone]    05/13/2020, 3:40 PM

## 2020-05-13 NOTE — Progress Notes (Signed)
PHYSICAL MEDICINE & REHABILITATION PROGRESS NOTE   Subjective/Complaints:   Pt reports LLE still hurts- due to his severe dysarthria, was difficult to tell if worse, better or the same.    ROS:   Pt denies SOB, abd pain, CP, N/V/C/D, and vision changes   Objective:   CT HEAD WO CONTRAST  Result Date: 05/12/2020 CLINICAL DATA:  Right-sided weakness. EXAM: CT HEAD WITHOUT CONTRAST TECHNIQUE: Contiguous axial images were obtained from the base of the skull through the vertex without intravenous contrast. COMPARISON:  Apr 17, 2020. FINDINGS: Brain: Mild chronic ischemic white matter disease is noted. Old lacunar infarction is noted in left basal ganglia. No mass effect or midline shift is noted. Ventricular size is within normal limits. There is no evidence of mass lesion, hemorrhage or acute infarction. Vascular: No hyperdense vessel or unexpected calcification. Skull: Normal. Negative for fracture or focal lesion. Sinuses/Orbits: No acute finding. Other: None. IMPRESSION: Mild chronic ischemic white matter disease. No acute intracranial abnormality seen. Electronically Signed   By: Marijo Conception M.D.   On: 05/12/2020 14:32   No results for input(s): WBC, HGB, HCT, PLT in the last 72 hours. No results for input(s): NA, K, CL, CO2, GLUCOSE, BUN, CREATININE, CALCIUM in the last 72 hours.  Intake/Output Summary (Last 24 hours) at 05/13/2020 1445 Last data filed at 05/13/2020 1258 Gross per 24 hour  Intake 890 ml  Output 1400 ml  Net -510 ml     Physical Exam: Vital Signs Blood pressure (!) 155/97, pulse 75, temperature 97.8 F (36.6 C), temperature source Oral, resp. rate 19, height 5\' 11"  (1.803 m), weight 100.2 kg, SpO2 100 %.   General: No acute distress- sitting up in bed- appropriate, NAD Severely dysarthric- but bright affect Heart: RRR; no MR/G, no JVD Lungs: CTA B/L- no W/R/R- good air movement Abdomen: Soft, NT, ND, (+)BS  Extremities: No clubbing, cyanosis,  or edema Skin: No evidence of breakdown, no evidence of rash  Musc: mild pain with PROM LLE, still- TTP "deep" in L hip,knee and foot/ankle RUE: 3/5 proximal to distal, unchanged, RLE: HF, KE 3-/4 ADF 0/5, unchanged LLE 4/5 HF, KE ADF  + SLR LLE pain behind knee to lateral calf  Dysarthria significant Right facial weakness  Assessment/Plan: 1. Functional deficits secondary to Left pontine infarct which require 3+ hours per day of interdisciplinary therapy in a comprehensive inpatient rehab setting.  Physiatrist is providing close team supervision and 24 hour management of active medical problems listed below.  Physiatrist and rehab team continue to assess barriers to discharge/monitor patient progress toward functional and medical goals  Care Tool:  Bathing    Body parts bathed by patient: Right arm, Left arm, Chest, Abdomen, Front perineal area, Right upper leg, Left upper leg, Face, Left lower leg   Body parts bathed by helper: Buttocks, Right lower leg Body parts n/a: Right lower leg, Left lower leg, Buttocks (did not attempt this session)   Bathing assist Assist Level: Maximal Assistance - Patient 24 - 49%     Upper Body Dressing/Undressing Upper body dressing   What is the patient wearing?: Pull over shirt    Upper body assist Assist Level: Moderate Assistance - Patient 50 - 74%    Lower Body Dressing/Undressing Lower body dressing      What is the patient wearing?: Pants, Incontinence brief     Lower body assist Assist for lower body dressing: Maximal Assistance - Patient 25 - 49%     Toileting  Toileting    Toileting assist Assist for toileting: Maximal Assistance - Patient 25 - 49%     Transfers Chair/bed transfer  Transfers assist     Chair/bed transfer assist level: Maximal Assistance - Patient 25 - 49%     Locomotion Ambulation   Ambulation assist   Ambulation activity did not occur: Safety/medical concerns  Assist level: 2 helpers (heavy  max A of 1 and +2 w/c follow) Assistive device: Walker-rolling Max distance: 58ft   Walk 10 feet activity   Assist  Walk 10 feet activity did not occur: Safety/medical concerns  Assist level: 2 helpers (heavy max A of 1 and +2 w/c follow) Assistive device: Walker-rolling   Walk 50 feet activity   Assist Walk 50 feet with 2 turns activity did not occur: Safety/medical concerns         Walk 150 feet activity   Assist Walk 150 feet activity did not occur: Safety/medical concerns         Walk 10 feet on uneven surface  activity   Assist Walk 10 feet on uneven surfaces activity did not occur: Safety/medical concerns         Wheelchair     Assist Will patient use wheelchair at discharge?: Yes Type of Wheelchair: Manual    Wheelchair assist level: Moderate Assistance - Patient 50 - 74% Max wheelchair distance: 82ft    Wheelchair 50 feet with 2 turns activity    Assist        Assist Level: Moderate Assistance - Patient 50 - 74%   Wheelchair 150 feet activity     Assist          Blood pressure (!) 155/97, pulse 75, temperature 97.8 F (36.6 C), temperature source Oral, resp. rate 19, height 5\' 11"  (1.803 m), weight 100.2 kg, SpO2 100 %.  Medical Problem List and Plan: 1.  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  Recommendations a 30-day cardiac event monitor as outpatient  Continue CIR PT, OT SLP  PRAFO ordered 2.  Antithrombotics: -DVT/anticoagulation: Lovenox             -antiplatelet therapy: Aspirin 325 mg and Plavix 75 mg daily x3 months then Plavix alone 3. Pain Management: Voltaren gel 4 times daily, Neurontin 800 mg 3 times daily-likely causing sedation- will reduce to 600mg  , Medrol dose pack   -LLE pain chronic with normal L hip MRI 2019, ? 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 Mood: Cymbalta 30 mg twice daily             -antipsychotic agents: Invega 3 mg daily 5. Neuropsych: This patient is capable of making decisions on his own behalf. 6. Skin/Wound Care: Routine skin checks 7. Fluids/Electrolytes/Nutrition: Routine in and outs with follow-up chemistries 8.  Diastolic congestive heart failure.  Monitor for any signs of fluid overload Filed Weights   05/11/20 0504 05/12/20 0305 05/13/20 0515  Weight: 99.2 kg 101.2 kg 100.2 kg   Baseline appear to be ~98kg----up to 100kg 6/13--does not appear volume overloaded, I/O's negative 145cc yesterday   -  follow for trend, accuracy of weights   6/20: improved.   6/26- Weight down to 100 kg- improved 9.  Diabetes mellitus with peripheral neuropathy.  Hemoglobin A1c 7.7.  SSI.    Glucophage 500 mg twice daily prior to admission, resumed on 6/4 CBG (last 3)  Recent Labs    05/12/20 2116 05/13/20 0556 05/13/20 1128  GLUCAP 298* 209* 211*  6/26- BGs elevated, but bette rin last 24 hours than 48 hrs ago- con't medrol dose pack 10.  Hypertension.  Patient on Coreg 25 mg twice daily, Norvasc 10 mg daily, hydralazine 25 mg 3 times daily, Cozaar 50 mg daily prior to admission.  Resume as needed Vitals:   05/13/20 0515 05/13/20 0810  BP: (!) 152/97 (!) 155/97  Pulse: 72 75  Resp: 19   Temp: 97.8 F (36.6 C)   SpO2: 100%    Amlodipine 5 started on 6/4, increase to 10 on 6/7, on Coreg 25mg  BID   Cozaar  50mg  ,increase to 100mg  as of 6/24- improving    -added hydralazine 10mg  tid increased to QID on 6/14, will increase to 25mg  tid , labile, cont to monitor check ortho vitals  6/26- BP 150s/90s- could also be due to steroids as  well- will wait to increase meds 11.  Hyperlipidemia.  Lipitor 12.  History of polysubstance tobacco and alcohol abuse.  Urine drug screen negative.  Provide counseling 13.  Post stroke dysphagia  D2, nectars, advance diet as tolerated   -BUN nl clinically appears adequately hydrated  LOS: 23 days A FACE TO FACE EVALUATION WAS PERFORMED  Nisa Decaire 05/13/2020, 2:45 PM

## 2020-05-14 LAB — GLUCOSE, CAPILLARY
Glucose-Capillary: 206 mg/dL — ABNORMAL HIGH (ref 70–99)
Glucose-Capillary: 192 mg/dL — ABNORMAL HIGH (ref 70–99)
Glucose-Capillary: 194 mg/dL — ABNORMAL HIGH (ref 70–99)
Glucose-Capillary: 230 mg/dL — ABNORMAL HIGH (ref 70–99)

## 2020-05-14 NOTE — Progress Notes (Signed)
[facility] PHYSICAL MEDICINE & REHABILITATION PROGRESS NOTE   Subjective/Complaints:   Pt reports LLE pain is "the same"- no other issues- family came and got diabetic teaching, including a menu/ideas and diabetic book. Coming Monday for family teaching.    ROS:   Pt denies SOB, abd pain, CP, N/V/C/D, and vision changes    Objective:   CT HEAD WO CONTRAST  Result Date: 05/12/2020 CLINICAL DATA:  Right-sided weakness. EXAM: CT HEAD WITHOUT CONTRAST TECHNIQUE: Contiguous axial images were obtained from the base of the skull through the vertex without intravenous contrast. COMPARISON:  Apr 17, 2020. FINDINGS: Brain: Mild chronic ischemic white matter disease is noted. Old lacunar infarction is noted in left basal ganglia. No mass effect or midline shift is noted. Ventricular size is within normal limits. There is no evidence of mass lesion, hemorrhage or acute infarction. Vascular: No hyperdense vessel or unexpected calcification. Skull: Normal. Negative for fracture or focal lesion. Sinuses/Orbits: No acute finding. Other: None. IMPRESSION: Mild chronic ischemic white matter disease. No acute intracranial abnormality seen. Electronically Signed   By: Marijo Conception M.D.   On: 05/12/2020 14:32   No results for input(s): WBC, HGB, HCT, PLT in the last 72 hours. No results for input(s): NA, K, CL, CO2, GLUCOSE, BUN, CREATININE, CALCIUM in the last 72 hours.  Intake/Output Summary (Last 24 hours) at 05/14/2020 1348 Last data filed at 05/14/2020 1250 Gross per 24 hour  Intake 476 ml  Output 1550 ml  Net -1074 ml     Physical Exam: Vital Signs Blood pressure (!) 172/101, pulse 75, temperature 98.4 F (36.9 C), temperature source Oral, resp. rate 17, height 5\' 11"  (1.803 m), weight 99.5 kg, SpO2 98 %.   General: No acute distress- sitting up in bed; appropriate, NAD Bright affect Heart:RRR Lungs:CTA B/L- no W/R/R- good air movement Abdomen: Soft, NT, ND, (+)BS   Extremities: No  clubbing, cyanosis, or edema Skin: No evidence of breakdown, no evidence of rash  Musc: mild pain with PROM LLE, still- TTP "deep" in L hip,knee and foot/ankle- no change RUE: 3/5 proximal to distal, unchanged, RLE: HF, KE 3-/4 ADF 0/5, unchanged LLE 4/5 HF, KE ADF  + SLR LLE pain behind knee to lateral calf  Dysarthria significant- unchanged Right facial weakness- unchanged  Assessment/Plan: 1. Functional deficits secondary to Left pontine infarct which require 3+ hours per day of interdisciplinary therapy in a comprehensive inpatient rehab setting.  Physiatrist is providing close team supervision and 24 hour management of active medical problems listed below.  Physiatrist and rehab team continue to assess barriers to discharge/monitor patient progress toward functional and medical goals  Care Tool:  Bathing    Body parts bathed by patient: Right arm, Left arm, Chest, Abdomen, Front perineal area, Right upper leg, Left upper leg, Face, Left lower leg   Body parts bathed by helper: Buttocks, Right lower leg Body parts n/a: Right lower leg, Left lower leg, Buttocks (did not attempt this session)   Bathing assist Assist Level: Maximal Assistance - Patient 24 - 49%     Upper Body Dressing/Undressing Upper body dressing   What is the patient wearing?: Pull over shirt    Upper body assist Assist Level: Moderate Assistance - Patient 50 - 74%    Lower Body Dressing/Undressing Lower body dressing      What is the patient wearing?: Pants, Incontinence brief     Lower body assist Assist for lower body dressing: Maximal Assistance - Patient 25 - 49%  Toileting Toileting    Toileting assist Assist for toileting: Maximal Assistance - Patient 25 - 49%     Transfers Chair/bed transfer  Transfers assist     Chair/bed transfer assist level: Moderate Assistance - Patient 50 - 74% (squat pivot)     Locomotion Ambulation   Ambulation assist   Ambulation activity did  not occur: Safety/medical concerns  Assist level: 2 helpers (heavy max A of 1 and +2 w/c follow) Assistive device: Walker-rolling Max distance: 84ft   Walk 10 feet activity   Assist  Walk 10 feet activity did not occur: Safety/medical concerns  Assist level: 2 helpers (heavy max A of 1 and +2 w/c follow) Assistive device: Walker-rolling   Walk 50 feet activity   Assist Walk 50 feet with 2 turns activity did not occur: Safety/medical concerns         Walk 150 feet activity   Assist Walk 150 feet activity did not occur: Safety/medical concerns         Walk 10 feet on uneven surface  activity   Assist Walk 10 feet on uneven surfaces activity did not occur: Safety/medical concerns         Wheelchair     Assist Will patient use wheelchair at discharge?: Yes Type of Wheelchair: Manual    Wheelchair assist level: Moderate Assistance - Patient 50 - 74% Max wheelchair distance: 70ft    Wheelchair 50 feet with 2 turns activity    Assist        Assist Level: Moderate Assistance - Patient 50 - 74%   Wheelchair 150 feet activity     Assist          Blood pressure (!) 172/101, pulse 75, temperature 98.4 F (36.9 C), temperature source Oral, resp. rate 17, height 5\' 11"  (1.803 m), weight 99.5 kg, SpO2 98 %.  Medical Problem List and Plan: 1.  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  Recommendations a 30-day cardiac event monitor as outpatient  Continue CIR PT, OT SLP  PRAFO ordered 2.  Antithrombotics: -DVT/anticoagulation: Lovenox             -antiplatelet therapy: Aspirin 325 mg and Plavix 75 mg daily x3 months then Plavix alone 3. Pain Management: Voltaren gel 4 times daily, Neurontin 800 mg 3 times daily-likely causing sedation- will reduce to 600mg  , Medrol dose pack   -LLE  pain chronic with normal L hip MRI 2019, ? 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   4. Mood: Cymbalta 30 mg twice daily  6/27- NOT on Cymbalta- likely due to being on Prozac and Remeron- suggest changing so can, for nerve pain             -antipsychotic agents: Invega 3 mg daily 5. Neuropsych: This patient is capable of making decisions on his own behalf. 6. Skin/Wound Care: Routine skin checks 7. Fluids/Electrolytes/Nutrition: Routine in and outs with follow-up chemistries 8.  Diastolic congestive heart failure.  Monitor for any signs of fluid overload Filed Weights   05/12/20 0305 05/13/20 0515  05/14/20 0549  Weight: 101.2 kg 100.2 kg 99.5 kg   Baseline appear to be ~98kg----up to 100kg 6/13--does not appear volume overloaded, I/O's negative 145cc yesterday   -follow for trend, accuracy of weights   6/20: improved.   6/26- Weight down to 100 kg- improved  6/27- Weight 99.5 kg 9.  Diabetes mellitus with peripheral neuropathy.  Hemoglobin A1c 7.7.  SSI.    Glucophage 500 mg twice daily prior to admission, resumed on 6/4 CBG (last 3)  Recent Labs    05/13/20 2112 05/14/20 0619 05/14/20 1150  GLUCAP 189* 206* 192*  6/27- BGs getting better daily- con't regimen 10.  Hypertension.  Patient on Coreg 25 mg twice daily, Norvasc 10 mg daily, hydralazine 25 mg 3 times daily, Cozaar 50 mg daily prior to admission.  Resume as needed Vitals:   05/14/20 0549 05/14/20 0744  BP: (!) 163/118 (!) 172/101  Pulse: 72 75  Resp: 18 17  Temp: 100 F (37.8 C) 98.4 F (36.9 C)  SpO2: 100% 98%   Amlodipine 5 started on 6/4, increase to 10 on 6/7, on Coreg 25mg  BID   Cozaar  50mg  ,increase to  100mg  as of 6/24- improving    -added hydralazine 10mg  tid increased to QID on 6/14, will increase to 25mg  tid , labile, cont to monitor check ortho vitals  6/26- BP 150s/90s- could also be due to steroids as well- will wait to increase meds  6/27- BP 160s-170s/100s in last 24 hours-suggest HCTZ if possible  11.  Hyperlipidemia.  Lipitor 12.  History of polysubstance tobacco and alcohol abuse.  Urine drug screen negative.  Provide counseling 13.  Post stroke dysphagia  D2, nectars, advance diet as tolerated   -BUN nl clinically appears adequately hydrated  LOS: 24 days A FACE TO FACE EVALUATION WAS PERFORMED  Freda Jaquith 05/14/2020, 1:48 PM

## 2020-05-15 ENCOUNTER — Inpatient Hospital Stay (HOSPITAL_COMMUNITY): Payer: Medicaid Other | Admitting: Physical Therapy

## 2020-05-15 ENCOUNTER — Inpatient Hospital Stay (HOSPITAL_COMMUNITY): Payer: Medicaid Other | Admitting: Occupational Therapy

## 2020-05-15 ENCOUNTER — Inpatient Hospital Stay (HOSPITAL_COMMUNITY): Payer: Medicaid Other | Admitting: Speech Pathology

## 2020-05-15 DIAGNOSIS — N179 Acute kidney failure, unspecified: Secondary | ICD-10-CM

## 2020-05-15 DIAGNOSIS — I6300 Cerebral infarction due to thrombosis of unspecified precerebral artery: Secondary | ICD-10-CM

## 2020-05-15 DIAGNOSIS — I10 Essential (primary) hypertension: Secondary | ICD-10-CM

## 2020-05-15 LAB — GLUCOSE, CAPILLARY
Glucose-Capillary: 182 mg/dL — ABNORMAL HIGH (ref 70–99)
Glucose-Capillary: 157 mg/dL — ABNORMAL HIGH (ref 70–99)
Glucose-Capillary: 211 mg/dL — ABNORMAL HIGH (ref 70–99)
Glucose-Capillary: 234 mg/dL — ABNORMAL HIGH (ref 70–99)

## 2020-05-15 MED ORDER — HYDROXYZINE HCL 25 MG PO TABS: 25.0000 mg | ORAL_TABLET | Freq: Two times a day (BID) | ORAL | 0 refills | Status: DC

## 2020-05-15 MED ORDER — CARVEDILOL 25 MG PO TABS: 25.0000 mg | ORAL_TABLET | Freq: Two times a day (BID) | ORAL | 0 refills | Status: AC

## 2020-05-15 MED ORDER — ATORVASTATIN CALCIUM 40 MG PO TABS: 40.0000 mg | ORAL_TABLET | Freq: Every day | ORAL | 0 refills | Status: AC

## 2020-05-15 MED ORDER — AMLODIPINE BESYLATE 10 MG PO TABS: 10.0000 mg | ORAL_TABLET | Freq: Every day | ORAL | 0 refills | Status: AC

## 2020-05-15 MED ORDER — ERGOCALCIFEROL 1.25 MG (50000 UT) PO CAPS: 1.0000 | ORAL_CAPSULE | ORAL | 0 refills | Status: DC

## 2020-05-15 MED ORDER — LOSARTAN POTASSIUM 100 MG PO TABS: 100.0000 mg | ORAL_TABLET | Freq: Every day | ORAL | 1 refills | Status: DC

## 2020-05-15 MED ORDER — FLUOXETINE HCL 10 MG PO CAPS: 10.0000 mg | ORAL_CAPSULE | Freq: Every day | ORAL | 3 refills | Status: AC

## 2020-05-15 MED ORDER — PALIPERIDONE ER 3 MG PO TB24: 3.0000 mg | ORAL_TABLET | Freq: Every day | ORAL | 0 refills | Status: DC

## 2020-05-15 MED ORDER — GABAPENTIN 300 MG PO CAPS: 600.0000 mg | ORAL_CAPSULE | Freq: Three times a day (TID) | ORAL | 1 refills | Status: DC

## 2020-05-15 MED ORDER — MIRTAZAPINE 7.5 MG PO TABS: 7.5000 mg | ORAL_TABLET | Freq: Every day | ORAL | 0 refills | Status: DC

## 2020-05-15 MED ORDER — CLOPIDOGREL BISULFATE 75 MG PO TABS: 75.0000 mg | ORAL_TABLET | Freq: Every day | ORAL | 0 refills | Status: AC

## 2020-05-15 MED ORDER — CYCLOBENZAPRINE HCL 5 MG PO TABS: 5.0000 mg | ORAL_TABLET | Freq: Three times a day (TID) | ORAL | 0 refills | Status: DC | PRN

## 2020-05-15 MED ORDER — PANTOPRAZOLE SODIUM 40 MG PO TBEC: 40.0000 mg | DELAYED_RELEASE_TABLET | Freq: Every day | ORAL | 0 refills | Status: DC

## 2020-05-15 MED ORDER — HYDRALAZINE HCL 25 MG PO TABS: 25.0000 mg | ORAL_TABLET | Freq: Three times a day (TID) | ORAL | 0 refills | Status: DC

## 2020-05-15 MED ORDER — ASPIRIN 325 MG PO TBEC: 325.0000 mg | DELAYED_RELEASE_TABLET | Freq: Every day | ORAL | 0 refills | Status: DC

## 2020-05-15 MED ORDER — METFORMIN HCL 500 MG PO TABS: 500.0000 mg | ORAL_TABLET | Freq: Two times a day (BID) | ORAL | 0 refills | Status: AC

## 2020-05-15 MED ORDER — DICLOFENAC SODIUM 1 % EX GEL: 2.0000 g | Freq: Four times a day (QID) | CUTANEOUS | 0 refills | Status: DC

## 2020-05-15 MED ORDER — FOLIC ACID 1 MG PO TABS: 1.0000 mg | ORAL_TABLET | Freq: Every day | ORAL | 0 refills | Status: DC

## 2020-05-15 NOTE — Plan of Care (Signed)
  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   Problem: RH Tub/Shower Transfers Goal: LTG Patient will perform tub/shower transfers w/assist (OT) Description: LTG: Patient will perform tub/shower transfers with assist, with/without cues using equipment (OT) Outcome: Not Met (add Reason) Note: Pt requires Total A for shower transfers   Problem: RH Balance Goal: LTG: Patient will maintain dynamic sitting balance (OT) Description: LTG:  Patient will maintain dynamic sitting balance with assistance during activities of daily living (OT) Outcome: Completed/Met Goal: LTG Patient will maintain dynamic standing with ADLs (OT) Description: LTG:  Patient will maintain dynamic standing balance with assist during activities of daily living (OT)  Outcome: Completed/Met   Problem: Sit to Stand Goal: LTG:  Patient will perform sit to stand in prep  for activites of daily living with assistance level (OT) Description: LTG:  Patient will perform sit to stand in prep for activites of daily living with assistance level (OT) Outcome: Completed/Met   Problem: RH Grooming Goal: LTG Patient will perform grooming w/assist,cues/equip (OT) Description: LTG: Patient will perform grooming with assist, with/without cues using equipment (OT) Outcome: Completed/Met   Problem: RH Functional Use of Upper Extremity Goal: LTG Patient will use RT/LT upper extremity as a (OT) Description: LTG: Patient will use right/left upper extremity as a stabilizer/gross assist/diminished/nondominant/dominant level with assist, with/without cues during functional activity (OT) Outcome: Completed/Met   Problem: RH Toilet Transfers Goal: LTG Patient will perform toilet transfers w/assist (OT) Description: LTG: Patient will perform toilet transfers with assist, with/without cues using equipment (OT) Outcome: Completed/Met   Problem: RH Memory Goal: LTG Patient will demonstrate ability for day to day recall/carry over during activities of daily living with assistance level (OT) Description: LTG:  Patient will demonstrate ability for day to day recall/carry over during activities of daily living with assistance level (OT). Outcome: Completed/Met

## 2020-05-15 NOTE — Progress Notes (Signed)
Occupational Therapy Session Note  Patient Details  Name: [name] MRN: [id number] Date of Birth: 1960/12/08  Today's Date: 05/15/2020 OT Individual Time: 1000-1044 and 8938-1017 OT Individual Time Calculation (min): 44 min and 27 min  Skilled Therapeutic Interventions/Progress Updates:     Pt greeted in bed with no c/o pain, reporting urinary incontinence in brief. With encouragement, pt agreeable to complete hygiene and LB dressing tasks sit<stand from EOB using RW for standing support. After OT donned Teds and footwear, Mod A for supine<sit with HOB elevated and heavy use of the bedrail. Mod A for 2 sit<stands using RW, pt requiring cuing to not assist therapist with hygiene and as he would lose his balance and required Max A to prevent fall. Pt required Mod A for static standing while OT assisted with pericare due to Rt pushing. Daughter Katharine Look and cousin Orvis Brill arrived at this time. Explained role of OT and explained body positioning/safety when standing with pt due to pusher syndrome. Marcavious had hands on practice assisting pt with elevating brief over hips and also donning pants. It took several sit<stands to complete these tasks due to pts decreased standing endurance. Explained and demonstrated drop arm BSC transfer afterwards using squat pivot technique. Marcavious had hands on practice with this, going from low surface to high surface, high to low, and level transfer surfaces, discussing adjusting BSC height to make it level with bed for safety. Marcavious then had hands on practice with setting up and completing squat pivot transfers to the w/c with min cuing. Discussed having pt stand statically with RW during toileting tasks while caregiver assisted with ADL task for safety. Also recommended sponge bathing at home vs shower for safety. They report the w/c cannot fit inside of the bathroom. Advised them to consult HHOT about showering goal in the future. Pt remained sitting  in the w/c at end of session, left with all needs within reach and safety belt fastened, half lap tray applied to w/c.   2nd Session 1:1 tx (27 min) Pt greeted in bed, just finished PT session and reported feeling very fatigued. Family present, feeling comfortable with transfers and declining opportunity to practice more. Asked if they wanted an extra day of family education prior to d/c and both dtr and cousin were in agreement for d/c tomorrow. OT printed pt a self ROM packet and reviewed exercises with pt and family. Discussed methods to facilitate increased functional use of Rt during daily activities and neuroplastic importance of this. Emphasized importance of daily engagement in pts self ROM HEP and pts need for family to assist him to participate. Both dtr and cousin verbalized understanding. Left pt in bed with all needs within reach and bed alarm set.    Therapy Documentation Precautions:  Precautions Precautions: Fall Precaution Comments: pusher, right hemiparesis, right hip/thigh pain Restrictions Weight Bearing Restrictions: No Vital Signs: Therapy Vitals Pulse Rate: 82 BP: (!) 158/90 Pain: Pain Assessment Pain Scale: Faces Pain Score: 0-No pain Faces Pain Scale: No hurt ADL: ADL Eating: Minimal assistance Where Assessed-Eating: Wheelchair Grooming: Minimal assistance Where Assessed-Grooming: Wheelchair Upper Body Bathing: Moderate assistance Where Assessed-Upper Body Bathing: Edge of bed Lower Body Bathing: Other (comment) (total +2) Where Assessed-Lower Body Bathing: Other (Comment), Edge of bed (total +2) Upper Body Dressing: Maximal assistance Where Assessed-Upper Body Dressing: Edge of bed Lower Body Dressing: Other (Comment) (total +2) Toileting: Other (Comment) (total +2) Where Assessed-Toileting: Bedside Commode Toilet Transfer: Other (comment) (total +2) Toilet Transfer Method: Stand pivot Science writer:  Bedside commode     Therapy/Group:  Individual Therapy  Nelani Schmelzle A Vangie Henthorn 05/15/2020, 12:17 PM

## 2020-05-15 NOTE — Progress Notes (Signed)
Speech Language Pathology Daily Session Note  Patient Details  Name: [name] MRN: [id number] Date of Birth: [birth year]  Today's Date: 05/15/2020 SLP Individual Time: 0830-0926 SLP Individual Time Calculation (min): 56 min  Short Term Goals: Week 4: SLP Short Term Goal 1 (Week 4): STG = LTG due to remaining length of stay  Skilled Therapeutic Interventions: Pt was seen for skilled ST targeting education with pt and his family members/caregivers (daughter and cousin). SLP facilitated session with review of treatment areas and progress throughout admission. Verbal review and handouts provided regarding current diet recommendations (dysphagia 2 - minced/fine chop, thin liquids with option to thicken liquids to nectar consistency if pt requests - teaching for thickening with thicken up powder provided). Discussed specific swallow strategies with emphasis on slow rate/rest breaks to coordinate breathing/swallowing sequence, bite size, and positioning. Also provided verbal review and handout regarding compensatory strategies to increase speech intelligibility due to mod-sev dysarthria. Verbal review and handouts provided regarding compensatory memory strategies for new learning/short term recall with emphasis on no multitasking, routines, external aids like calendars, and note taking. Also emphasized recommendation for 24/7 supervision and full assist with semi-complex to complex cognitive tasks such as medication and finance management, cooking, etc. All questions regarding swallowing, speech, and cognition answered to pt and family's satisfaction. Pt left laying in bed with alarm set and needs within reach, family and MD present. Continue per current plan of care.        Pain Pain Assessment Pain Scale: Faces Pain Score: 0-No pain Faces Pain Scale: No hurt  Therapy/Group: Individual Therapy  Arbutus Leas 05/15/2020, 7:12 AM

## 2020-05-15 NOTE — Progress Notes (Signed)
Occupational Therapy Discharge Summary  Patient Details  Name: [name] MRN: [id number] Date of Birth: 1961-01-06  Patient has met 7 of 13 long term goals due to improved activity tolerance, improved balance, postural control, ability to compensate for deficits and improved coordination.  Patient to discharge at overall Mod- Max Assist level.  Patient's family has participated in hands on family education to provide the necessary assistance at discharge.   6 goals were unable to be met due to pts pusher syndrome/decreased postural control limiting functional progress  Recommendation:  Patient will benefit from ongoing skilled OT services in home health setting to continue to advance functional skills in the area of BADL.  Equipment: TTB, drop arm BSC, hospital bed  Reasons for discharge: treatment goals met and discharge from hospital  Patient/family agrees with progress made and goals achieved: Yes  OT Discharge Vital Signs Therapy Vitals Temp: 97.6 F (36.4 C) Pulse Rate: 81 Resp: 18 BP: (!) 135/94 Patient Position (if appropriate): Lying Oxygen Therapy SpO2: 98 % O2 Device: Room Air ADL ADL Eating: Supervision/safety Where Assessed-Eating: Wheelchair Grooming: Setup Where Assessed-Grooming: Wheelchair Upper Body Bathing: Minimal assistance Where Assessed-Upper Body Bathing: Shower Lower Body Bathing: Maximal assistance Where Assessed-Lower Body Bathing: Shower Upper Body Dressing: Moderate assistance Where Assessed-Upper Body Dressing: Edge of bed Lower Body Dressing: Maximal assistance Toileting: Maximal assistance Where Assessed-Toileting: Bedside Commode Toilet Transfer: Moderate assistance Toilet Transfer Method: Squat pivot Toilet Transfer Equipment: Drop arm bedside commode Social research officer, government: Dependent Cognition Arousal/Alertness: Lethargic Orientation Level: Oriented X4 Safety/Judgment: Impaired Glass blower/designer - Discharge  Observations: Rt pusher, Rt hemiparesis Mobility    Mod A squat pivot drop arm BSC transfers, Total A stand pivot shower transfers Trunk/Postural Assessment  Postural Control Postural Control: Deficits on evaluation (Rt pushing tendencies)  Balance Balance Balance Assessed: Yes Dynamic Sitting Balance Dynamic Sitting - Balance Support: During functional activity Dynamic Sitting - Level of Assistance: 4: Min assist (assisting with donning pants while sitting EOB) Dynamic Standing Balance Dynamic Standing - Balance Support: During functional activity Dynamic Standing - Level of Assistance: 2: Max assist (attempting to assist with perihygiene) Extremity/Trunk Assessment RUE Assessment General Strength Comments: Brunnstrom Stage V LUE Assessment LUE Assessment: Within Functional Limits   Marguita Venning A Wilhelmine Krogstad 05/15/2020, 3:46 PM

## 2020-05-15 NOTE — Progress Notes (Signed)
[facility] PHYSICAL MEDICINE & REHABILITATION PROGRESS NOTE   Subjective/Complaints: Patient seen laying in bed this AM.  He states he slept well overnight.  He states he had a good weekend.   ROS: Denies CP, SOB, N/V/D  Objective:   No results found. No results for input(s): WBC, HGB, HCT, PLT in the last 72 hours. No results for input(s): NA, K, CL, CO2, GLUCOSE, BUN, CREATININE, CALCIUM in the last 72 hours.  Intake/Output Summary (Last 24 hours) at 05/15/2020 1211 Last data filed at 05/15/2020 0945 Gross per 24 hour  Intake 628 ml  Output 1625 ml  Net -997 ml     Physical Exam: Vital Signs Blood pressure (!) 158/90, pulse 82, temperature 99.1 F (37.3 C), temperature source Oral, resp. rate 15, height 5\' 11"  (1.803 m), weight 99 kg, SpO2 99 %.  Constitutional: No distress . Vital signs reviewed. HENT: Normocephalic.  Atraumatic. Eyes: EOMI. No discharge. Cardiovascular: No JVD. Respiratory: Normal effort.  No stridor. GI: Non-distended. Skin: Warm and dry.  Intact. Psych: Normal mood.  Normal behavior. Musc: No edema in extremities.  No tenderness in extremities. Neuro: Alert Dysarthria Motor:  RUE: 3+/5 proximal to distal RLE: HF, KE 2+/4 ADF 0/5 LLE 4/5 HF, KE ADF  Severe dysarthria  Right facial weakness, stable  Assessment/Plan: 1. Functional deficits secondary to Left pontine infarct which require 3+ hours per day of interdisciplinary therapy in a comprehensive inpatient rehab setting.  Physiatrist is providing close team supervision and 24 hour management of active medical problems listed below.  Physiatrist and rehab team continue to assess barriers to discharge/monitor patient progress toward functional and medical goals  Care Tool:  Bathing    Body parts bathed by patient: Right arm, Left arm, Chest, Abdomen, Front perineal area, Right upper leg, Left upper leg, Face, Left lower leg   Body parts bathed by helper: Buttocks, Right lower leg Body  parts n/a: Right lower leg, Left lower leg, Buttocks (did not attempt this session)   Bathing assist Assist Level: Maximal Assistance - Patient 24 - 49%     Upper Body Dressing/Undressing Upper body dressing   What is the patient wearing?: Pull over shirt    Upper body assist Assist Level: Moderate Assistance - Patient 50 - 74%    Lower Body Dressing/Undressing Lower body dressing      What is the patient wearing?: Pants, Incontinence brief     Lower body assist Assist for lower body dressing: Maximal Assistance - Patient 25 - 49%     Toileting Toileting    Toileting assist Assist for toileting: Maximal Assistance - Patient 25 - 49%     Transfers Chair/bed transfer  Transfers assist     Chair/bed transfer assist level: Moderate Assistance - Patient 50 - 74% (squat pivot)     Locomotion Ambulation   Ambulation assist   Ambulation activity did not occur: Safety/medical concerns  Assist level: 2 helpers (heavy max A of 1 and +2 w/c follow) Assistive device: Walker-rolling Max distance: 102ft   Walk 10 feet activity   Assist  Walk 10 feet activity did not occur: Safety/medical concerns  Assist level: 2 helpers (heavy max A of 1 and +2 w/c follow) Assistive device: Walker-rolling   Walk 50 feet activity   Assist Walk 50 feet with 2 turns activity did not occur: Safety/medical concerns         Walk 150 feet activity   Assist Walk 150 feet activity did not occur: Safety/medical concerns  Walk 10 feet on uneven surface  activity   Assist Walk 10 feet on uneven surfaces activity did not occur: Safety/medical concerns         Wheelchair     Assist Will patient use wheelchair at discharge?: Yes Type of Wheelchair: Manual    Wheelchair assist level: Moderate Assistance - Patient 50 - 74% Max wheelchair distance: 83ft    Wheelchair 50 feet with 2 turns activity    Assist        Assist Level: Moderate Assistance -  Patient 50 - 74%   Wheelchair 150 feet activity     Assist          Blood pressure (!) 158/90, pulse 82, temperature 99.1 F (37.3 C), temperature source Oral, resp. rate 15, height 5\' 11"  (1.803 m), weight 99 kg, SpO2 99 %.  Medical Problem List and Plan: 1.  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  Recommendations a 30-day cardiac event monitor as outpatient  Continue CIR   PRAFO qhs 2.  Antithrombotics: -DVT/anticoagulation: Lovenox             -antiplatelet therapy: Aspirin 325 mg and Plavix 75 mg daily x3 months then Plavix alone 3. Pain Management: Voltaren gel 4 times daily, Medrol dose pack completed  Medrol dose pack  -LLE pain chronic with normal L hip MRI 2019, ? CVA related vs sciatic, increased gabapentin,  lumbar films show disc height loss L5-S1, no spondylolisthesis.  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 in future but would avoid for 2 more months while on DAPT  4. Mood: Cymbalta 30 mg twice daily discontinued  Fluoxetine DC'd on 6/28 due to interaction with Plavix             -antipsychotic agents: Invega 3 mg daily 5. Neuropsych: This patient is capable of making decisions on his own behalf. 6. Skin/Wound Care: Routine skin checks 7. Fluids/Electrolytes/Nutrition: Routine in and outs with follow-up chemistries 8.  Diastolic congestive heart failure.  Monitor for any signs of fluid overload Filed Weights   05/13/20 0515 05/14/20 0549 05/15/20 0509  Weight: 100.2 kg 99.5 kg 99 kg   Stable on 6/28 9.  Diabetes mellitus with peripheral neuropathy.  Hemoglobin A1c 7.7.  SSI.    Glucophage 500 mg twice daily prior to admission, resumed on 6/4 CBG (last 3)  Recent Labs    05/14/20 1632 05/14/20 2113 05/15/20 0636  GLUCAP 194* 230* 182*   Elevated,?  Improving  on 6/28 10.  Hypertension.  Patient on Coreg 25 mg twice daily, Norvasc 10 mg daily, hydralazine 25 mg 3 times daily, Cozaar 50 mg daily prior to admission.  Resume as needed Vitals:   05/15/20 0509 05/15/20 0823  BP: (!) 150/104 (!) 158/90  Pulse: 72 82  Resp: 15   Temp: 99.1 F (37.3 C)   SpO2: 99%    Amlodipine 5 started on 6/4, increase to 10 on 6/7, on Coreg 25mg  BID   Cozaar  50mg  ,increase to 100mg  as of 6/24  Added hydralazine 10mg  tid increased to QID on 6/14, will increase to 25mg  tid   Elevated, but?  Improving on 6/28, will consider further medications if persistent 11.  Hyperlipidemia.  Lipitor 12.  History of polysubstance tobacco and alcohol abuse.  Urine drug screen negative.  Provide  counseling 13.  Post stroke dysphagia  D2, thins, advance diet as tolerated 14.  AKI vs CKD  Creatinine 1.39 on 6/22, labs ordered for tomorrow    25 days A FACE TO FACE EVALUATION WAS PERFORMED  Catrena Vari Lorie Phenix 05/15/2020, 12:11 PM

## 2020-05-15 NOTE — Progress Notes (Signed)
Speech Language Pathology Discharge Summary  Patient Details  Name: [name] MRN: [id number] Date of Birth: 11/16/1961  Patient has met 7 of 7 long term goals.  Patient to discharge at overall Min;Supervision level.  Reasons goals not met: n/a   Clinical Impression/Discharge Summary:   Pt made slow functional gains and cognitive goals were downgraded during his stay, however he did meet 7 out of 7 long term goals this admission. Pt currently requires Min assist for basic cognitive tasks due to impairments impacting his sustained attention, basic problem solving, short term memory, and emergent awareness. Semi-complex to complex tasks typically require increased Mod A cueing. Pt is also utilizing speech intelligibility strategies to compensate to moderately severe dysarthria with Min A during structured phrase level activities, however increased Mod A cues for intention use of slow rate, overarticulation, and increased vocal intensity are required in more informal and sentence/converstaion level communication exchanges. Pt will require 24/7 supervision at discharge for his greatest safety due to current impairments. Pt is consuming Dysphagia 2 (minced/fine chop) diet with thin liquids and is using safe swallow strategies with Supervision A level verbal cues. At times, pt prefers nectar thick liquids due to intermittent coughing with thin (which most recent MBSS indicated is strong enough to clear aspirates from vestibule). Education has been provided regarding thickening. Pt also presents with decreased respiratory status, which is exacerbated during trials of upgraded solids due to prolonged and inefficient mastication. Pt has demonstrated improved basic problem solving, oral phase swallow function, and independence with use of speech strategies during admission. However, given dysphagia, dysarthria, and cognitive deficits still present, recommend pt continue to receive skilled ST services upon  discharge. Pt and family education is complete at this time.   Care Partner:  Caregiver Able to Provide Assistance: Yes  Type of Caregiver Assistance: Cognitive  Recommendation:  Home Health SLP;24 hour supervision/assistance  Rationale for SLP Follow Up: Maximize functional communication;Maximize cognitive function and independence;Maximize swallowing safety;Reduce caregiver burden   Equipment: none   Reasons for discharge: Discharged from hospital   Patient/Family Agrees with Progress Made and Goals Achieved: Yes     E  05/15/2020, 7:16 AM    

## 2020-05-15 NOTE — Discharge Summary (Signed)
Physician Discharge Summary  Patient ID: [name] MRN: [id number] DOB/AGE: 1961/11/12 59 y.o.  Admit date: 04/20/2020 Discharge date: 05/16/2020  Discharge Diagnoses:  Principal Problem:   Left pontine cerebrovascular accident [facility]) Active Problems:   Diabetes mellitus ([place])   Hypertension associated with diabetes ([place])   Hyperlipidemia associated with type 2 diabetes mellitus ([place])   Schizoaffective disorder, bipolar type ([place])   Dysphagia, post-stroke   Chronic diastolic congestive heart failure (HCC)   Benign essential HTN Hyperlipidemia History of polysubstance abuse and alcohol  Discharged Condition: Stable  Significant Diagnostic Studies: CT ANGIO HEAD W OR WO CONTRAST  Result Date: 04/18/2020 CLINICAL DATA:  Stroke follow-up EXAM: CT ANGIOGRAPHY HEAD AND NECK TECHNIQUE: Multidetector CT imaging of the head and neck was performed using the standard protocol during bolus administration of intravenous contrast. Multiplanar CT image reconstructions and MIPs were obtained to evaluate the vascular anatomy. Carotid stenosis measurements (when applicable) are obtained utilizing NASCET criteria, using the distal internal carotid diameter as the denominator. CONTRAST:  22mL OMNIPAQUE IOHEXOL 350 MG/ML SOLN COMPARISON:  Brain MRI and MRA from earlier today FINDINGS: CTA NECK FINDINGS Aortic arch: Atheromatous wall thickening.  No acute finding. Right carotid system: Tortuous with partial retropharyngeal course. Mild atheromatous changes at the bifurcation and distal common carotid. No flow limiting stenosis or ulceration. Left carotid system: Mild atheromatous plaque at the proximal common carotid. No stenosis or ulceration. Vertebral arteries: No proximal subclavian stenosis. The codominant vertebral arteries are smooth and widely patent to the dura. Skeleton: Advanced mid cervical disc degeneration with disc space collapse and endplate sclerosis. Other neck: No acute finding. Upper  chest: No acute finding Review of the MIP images confirms the above findings CTA HEAD FINDINGS Anterior circulation: Atheromatous plaque along the bilateral carotid siphons with 45% left periophthalmic ICA narrowing and 75% narrowing at the supraclinoid right ICA. Severe bilateral pericallosal stenosis with areas of imperceptible luminal enhancement. Severe left A1 origin stenosis. Accounting for atherosclerotic irregularity no aneurysm is seen. Posterior circulation: The vertebral and basilar arteries are smooth and widely patent. Advanced atheromatous type narrowing of bilateral P4 branches. Venous sinuses: Diffusely patent Anatomic variants: None significant Review of the MIP images confirms the above findings IMPRESSION: 1. Advanced intracranial atherosclerosis with high-grade narrowings at the supraclinoid right ICA, left A1 origin, bilateral pericallosal arteries, and bilateral PCA branches. 2. Cervical carotid atherosclerosis without flow limiting stenosis in the neck. Electronically Signed   By: Monte Fantasia M.D.   On: 04/18/2020 08:14   DG Chest 1 View  Result Date: 04/17/2020 CLINICAL DATA:  59 year old male with cough and chest pain. EXAM: CHEST  1 VIEW COMPARISON:  Chest radiograph dated 02/07/2020. FINDINGS: The heart size and mediastinal contours are within normal limits. Both lungs are clear. The visualized skeletal structures are unremarkable. IMPRESSION: No active disease. Electronically Signed   By: Anner Crete M.D.   On: 04/17/2020 22:36   DG Chest 2 View  Result Date: 05/09/2020 CLINICAL DATA:  Wheezing EXAM: CHEST - 2 VIEW COMPARISON:  04/17/2020 FINDINGS: The heart size and mediastinal contours are within normal limits. Both lungs are clear. Chronic deformity at the right [facility] joint. IMPRESSION: No active cardiopulmonary disease. Electronically Signed   By: Donavan Foil M.D.   On: 05/09/2020 21:51   DG Lumbar Spine 2-3 Views  Result Date: 04/25/2020 CLINICAL DATA:  Low back  pain with lower extremity radicular symptoms EXAM: LUMBAR SPINE - 2-3 VIEW COMPARISON:  None. FINDINGS: Frontal, lateral, and spot lumbosacral lateral images were  obtained. There are 5 non-rib-bearing lumbar type vertebral bodies. No fracture or spondylolisthesis. There is moderate disc space narrowing at L5-S1 with milder disc space narrowing at L4-5. Other disc spaces appear unremarkable. No erosive change. IMPRESSION: Osteoarthritic change at L5-S1 and to a lesser degree at L4-5. Other disc spaces appear unremarkable. No fracture or spondylolisthesis. Electronically Signed   By: Lowella Grip III M.D.   On: 04/25/2020 11:43   CT HEAD WO CONTRAST  Result Date: 05/12/2020 CLINICAL DATA:  Right-sided weakness. EXAM: CT HEAD WITHOUT CONTRAST TECHNIQUE: Contiguous axial images were obtained from the base of the skull through the vertex without intravenous contrast. COMPARISON:  Apr 17, 2020. FINDINGS: Brain: Mild chronic ischemic white matter disease is noted. Old lacunar infarction is noted in left basal ganglia. No mass effect or midline shift is noted. Ventricular size is within normal limits. There is no evidence of mass lesion, hemorrhage or acute infarction. Vascular: No hyperdense vessel or unexpected calcification. Skull: Normal. Negative for fracture or focal lesion. Sinuses/Orbits: No acute finding. Other: None. IMPRESSION: Mild chronic ischemic white matter disease. No acute intracranial abnormality seen. Electronically Signed   By: Marijo Conception M.D.   On: 05/12/2020 14:32   CT ANGIO NECK W OR WO CONTRAST  Result Date: 04/18/2020 CLINICAL DATA:  Stroke follow-up EXAM: CT ANGIOGRAPHY HEAD AND NECK TECHNIQUE: Multidetector CT imaging of the head and neck was performed using the standard protocol during bolus administration of intravenous contrast. Multiplanar CT image reconstructions and MIPs were obtained to evaluate the vascular anatomy. Carotid stenosis measurements (when applicable) are  obtained utilizing NASCET criteria, using the distal internal carotid diameter as the denominator. CONTRAST:  71mL OMNIPAQUE IOHEXOL 350 MG/ML SOLN COMPARISON:  Brain MRI and MRA from earlier today FINDINGS: CTA NECK FINDINGS Aortic arch: Atheromatous wall thickening.  No acute finding. Right carotid system: Tortuous with partial retropharyngeal course. Mild atheromatous changes at the bifurcation and distal common carotid. No flow limiting stenosis or ulceration. Left carotid system: Mild atheromatous plaque at the proximal common carotid. No stenosis or ulceration. Vertebral arteries: No proximal subclavian stenosis. The codominant vertebral arteries are smooth and widely patent to the dura. Skeleton: Advanced mid cervical disc degeneration with disc space collapse and endplate sclerosis. Other neck: No acute finding. Upper chest: No acute finding Review of the MIP images confirms the above findings CTA HEAD FINDINGS Anterior circulation: Atheromatous plaque along the bilateral carotid siphons with 45% left periophthalmic ICA narrowing and 75% narrowing at the supraclinoid right ICA. Severe bilateral pericallosal stenosis with areas of imperceptible luminal enhancement. Severe left A1 origin stenosis. Accounting for atherosclerotic irregularity no aneurysm is seen. Posterior circulation: The vertebral and basilar arteries are smooth and widely patent. Advanced atheromatous type narrowing of bilateral P4 branches. Venous sinuses: Diffusely patent Anatomic variants: None significant Review of the MIP images confirms the above findings IMPRESSION: 1. Advanced intracranial atherosclerosis with high-grade narrowings at the supraclinoid right ICA, left A1 origin, bilateral pericallosal arteries, and bilateral PCA branches. 2. Cervical carotid atherosclerosis without flow limiting stenosis in the neck. Electronically Signed   By: Monte Fantasia M.D.   On: 04/18/2020 08:14   MR ANGIO HEAD WO CONTRAST  Result Date:  04/18/2020 CLINICAL DATA:  Pontine infarct EXAM: MRA HEAD WITHOUT CONTRAST TECHNIQUE: Angiographic images of the Circle of Willis were obtained using MRA technique without intravenous contrast. COMPARISON:  Brain MRI 04/18/2020 FINDINGS: Examination is degraded mildly by motion. POSTERIOR CIRCULATION: --Basilar artery: Normal. --Superior cerebellar arteries: Normal. --Posterior cerebral arteries:  Severe stenosis of the right PCA proximal P3 segment. Normal left PCA. ANTERIOR CIRCULATION: --Intracranial internal carotid arteries: Normal. --Anterior cerebral arteries (ACA): Normal. Both A1 segments are present. Patent anterior communicating artery (a-comm). --Middle cerebral arteries (MCA): Normal. IMPRESSION: Motion degraded study without emergent large vessel occlusion. Electronically Signed   By: Ulyses Jarred M.D.   On: 04/18/2020 03:54   MR BRAIN WO CONTRAST  Result Date: 04/18/2020 CLINICAL DATA:  Stroke follow-up EXAM: MRI HEAD WITHOUT CONTRAST TECHNIQUE: Multiplanar, multiecho pulse sequences of the brain and surrounding structures were obtained without intravenous contrast. COMPARISON:  None. FINDINGS: Brain: There is a small acute infarct of the left pons. There is a punctate focus of DWI hyperintensity in the right frontal white matter without a clear correlate on ADC. Early confluent hyperintense T2-weighted signal of the periventricular and deep white matter, most commonly due to chronic ischemic microangiopathy. Old bilateral basal ganglia small vessel infarcts. Multifocal chronic microhemorrhage within the cerebellum and both cerebral hemispheres. There are 5-7 foci total. Normal midline structures. Vascular: Normal flow voids. Skull and upper cervical spine: Normal marrow signal. Sinuses/Orbits: Negative. Other: None. IMPRESSION: 1. Small acute infarct of the left pons. No hemorrhage or mass effect. 2. Punctate focus of DWI hyperintensity in the right frontal white matter without a clear correlate on  ADC. This may indicate a small acute or subacute infarct. 3. Moderate chronic ischemic microangiopathy. 4. Multifocal chronic microhemorrhage within the cerebellum and both cerebral hemispheres. Electronically Signed   By: Ulyses Jarred M.D.   On: 04/18/2020 03:21   MR LUMBAR SPINE WO CONTRAST  Result Date: 05/08/2020 CLINICAL DATA:  Left thigh pain EXAM: MRI LUMBAR SPINE WITHOUT CONTRAST TECHNIQUE: Multiplanar, multisequence MR imaging of the lumbar spine was performed. No intravenous contrast was administered. COMPARISON:  None. FINDINGS: Segmentation:  Standard. Alignment:  Mild retrolisthesis at L2-L3. Vertebrae: No evidence of recent compression deformity. Mild degenerative endplate marrow changes. No suspicious osseous lesion. Conus medullaris and cauda equina: Conus extends to the L1 level. Conus and cauda equina appear normal. Paraspinal and other soft tissues: Unremarkable. 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 L1-L2:  No significant canal foraminal stenosis. L2-L3: Disc bulge mildly prominent epidural fat mild facet arthropathy. Moderate canal stenosis. No right foraminal stenosis. Minor left foraminal stenosis. L3-L4: Mildly prominent epidural fat. No significant canal or foraminal stenosis. L4-L5: Disc bulge with central disc protrusion, endplate osteophytes, mildly prominent epidural fat, and mild facet arthropathy. Marked canal stenosis with effacement of the lateral recesses. Mild foraminal stenosis. L5-S1: Prominent epidural fat effacing the thecal sac. No foraminal stenosis. 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 Findings are most pronounced at L4-L5 where there is marked canal stenosis and effacement of the lateral recesses. Electronically Signed   By: Macy Mis M.D.   On: 05/08/2020 17:26   ECHOCARDIOGRAM COMPLETE  Result Date: 04/18/2020    ECHOCARDIOGRAM REPORT   Patient Name:    CORMICK MOSS Date of Exam: 04/18/2020 Medical Rec #:  [id number]            Height:       71.0 in Accession #:    [id number]           Weight:       221.1 lb Date of Birth:  [birth year]           BSA:          2.200 m Patient Age:    71  years             BP:           152/113 mmHg Patient Gender: M                    HR:           81 bpm. Exam Location:  Inpatient Procedure: 2D Echo, Cardiac Doppler and Color Doppler Indications:    Stroke 434.91 / I163.9  History:        Patient has prior history of Echocardiogram examinations, most                 recent 02/01/2018. CHF; Risk Factors:Hypertension and Diabetes.                 Alcohol abuse. Cocaine abuse.  Sonographer:    Jonelle Sidle Dance Referring Phys: [id number] [place]  1. Left ventricular ejection fraction, by estimation, is 45 to 50%. The left ventricle has mildly decreased function. The left ventricle demonstrates global hypokinesis. Left ventricular diastolic parameters are indeterminate.  2. Right ventricular systolic function is normal. The right ventricular size is normal.  3. The mitral valve is normal in structure. Trivial mitral valve regurgitation. No evidence of mitral stenosis.  4. Unable to determine valve morphology due to image quality. Aortic valve regurgitation is not visualized. No aortic stenosis is present.  5. Aortic dilatation noted. 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  6. The inferior vena cava is dilated in size with >50% respiratory variability, suggesting right atrial pressure of 8 mmHg. FINDINGS  Left Ventricle: Left ventricular ejection fraction, by estimation, is 45 to 50%. The left ventricle has mildly decreased function. The left ventricle demonstrates global hypokinesis. The left ventricular internal cavity size was normal in size. There is  borderline left ventricular hypertrophy. Left ventricular diastolic parameters are indeterminate. Right Ventricle: The right ventricular size  is normal. No increase in right ventricular wall thickness. Right ventricular systolic function is normal. Left Atrium: Left atrial size was normal in size. Right Atrium: Right atrial size was normal in size. Pericardium: There is no evidence of pericardial effusion. Mitral Valve: The mitral valve is normal in structure. Trivial mitral valve regurgitation. No evidence of mitral valve stenosis. Tricuspid Valve: The tricuspid valve is normal in structure. Tricuspid valve regurgitation is trivial. Aortic Valve: Unable to determine valve morphology due to image quality. Aortic valve regurgitation is not visualized. No aortic stenosis is present. Pulmonic Valve: The pulmonic valve was grossly normal. Pulmonic valve regurgitation is not visualized. No evidence of pulmonic stenosis. Aorta: Aortic dilatation noted. 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 Venous: The inferior vena cava is dilated in size with greater than 50% respiratory variability, suggesting right atrial pressure of 8 mmHg. IAS/Shunts: No atrial level shunt detected by color flow Doppler.  LEFT VENTRICLE PLAX 2D LVIDd:         5.40 cm  Diastology LVIDs:         4.10 cm  LV e' lateral:   8.38 cm/s LV PW:         1.00 cm  LV E/e' lateral: 10.8 LV IVS:        1.30 cm  LV e' medial:    5.22 cm/s LVOT diam:     2.30 cm  LV E/e' medial:  17.3 LV SV:         74 LV SV Index:  33 LVOT Area:     4.15 cm  RIGHT VENTRICLE             IVC RV Basal diam:  2.30 cm     IVC diam: 2.20 cm RV S prime:     14.50 cm/s TAPSE (M-mode): 1.8 cm LEFT ATRIUM             Index LA diam:        3.60 cm 1.64 cm/m LA Vol (A2C):   63.9 ml 29.04 ml/m LA Vol (A4C):   40.7 ml 18.50 ml/m LA Biplane Vol: 52.8 ml 23.99 ml/m  AORTIC VALVE LVOT Vmax:   90.20 cm/s LVOT Vmean:  59.700 cm/s LVOT VTI:    0.177 m  AORTA Ao Root diam: 4.10 cm Ao Asc diam:  4.00 cm MITRAL VALVE MV Area (PHT): 5.01 cm    SHUNTS MV Decel Time: 152 msec    Systemic VTI:  0.18 m MV E  velocity: 90.50 cm/s  Systemic Diam: 2.30 cm Buford Dresser MD Electronically signed by Buford Dresser MD Signature Date/Time: 04/18/2020/10:21:13 PM    Final    CT HEAD CODE STROKE WO CONTRAST  Result Date: 04/17/2020 CLINICAL DATA:  Code stroke.  Right-sided facial droop EXAM: CT HEAD WITHOUT CONTRAST TECHNIQUE: Contiguous axial images were obtained from the base of the skull through the vertex without intravenous contrast. COMPARISON:  03/03/2018 FINDINGS: Brain: There is no mass, hemorrhage or extra-axial collection. The size and configuration of the ventricles and extra-axial CSF spaces are normal. There is hypoattenuation of the periventricular white matter, most commonly indicating chronic ischemic microangiopathy. There are small vessel infarcts of the left basal ganglia that are likely chronic. Vascular: No abnormal hyperdensity of the major intracranial arteries or dural venous sinuses. No intracranial atherosclerosis. Skull: The visualized skull base, calvarium and extracranial soft tissues are normal. Sinuses/Orbits: No fluid levels or advanced mucosal thickening of the visualized paranasal sinuses. No mastoid or middle ear effusion. The orbits are normal. ASPECTS [facility] Stroke Program Early CT Score) - Ganglionic level infarction (caudate, lentiform nuclei, internal capsule, insula, M1-M3 cortex): 7 - Supraganglionic infarction (M4-M6 cortex): 3 Total score (0-10 with 10 being normal): 10 IMPRESSION: 1. No acute intracranial abnormality. 2. ASPECTS is 10. 3. Chronic ischemic microangiopathy and small vessel infarcts of the left basal ganglia that are likely old. 4. These results were communicated to Dr. Amie Portland at 9:58 pm on 04/17/2020 by text page via the [facility] messaging system. Electronically Signed   By: Ulyses Jarred M.D.   On: 04/17/2020 21:58   VAS [place] LOWER EXTREMITY VENOUS (DVT)  Result Date: 05/08/2020  Lower Venous DVTStudy Indications: Chronic thigh pain.  Comparison  Study: none Performing Technologist: June Leap RDMS, RVT  Examination Guidelines: A complete evaluation includes B-mode imaging, spectral Doppler, color Doppler, and power Doppler as needed of all accessible portions of each vessel. Bilateral testing is considered an integral part of a complete examination. Limited examinations for reoccurring indications may be performed as noted. The reflux portion of the exam is performed with the patient in reverse Trendelenburg.  +---------+---------------+---------+-----------+----------+--------------+ LEFT     CompressibilityPhasicitySpontaneityPropertiesThrombus Aging +---------+---------------+---------+-----------+----------+--------------+ CFV      Full           Yes      Yes                                 +---------+---------------+---------+-----------+----------+--------------+ SFJ  Full                                                        +---------+---------------+---------+-----------+----------+--------------+ FV Prox  Full                                                        +---------+---------------+---------+-----------+----------+--------------+ FV Mid   Full                                                        +---------+---------------+---------+-----------+----------+--------------+ FV DistalFull                                                        +---------+---------------+---------+-----------+----------+--------------+ PFV      Full                                                        +---------+---------------+---------+-----------+----------+--------------+ POP      Full           Yes      Yes                                 +---------+---------------+---------+-----------+----------+--------------+ PTV      Full                                                        +---------+---------------+---------+-----------+----------+--------------+ PERO     Full                                                         +---------+---------------+---------+-----------+----------+--------------+     Summary: LEFT: - There is no evidence of deep vein thrombosis in the lower extremity.  - No cystic structure found in the popliteal fossa. - Ultrasound characteristics of enlarged lymph nodes noted in the groin.  *See table(s) above for measurements and observations. Electronically signed by Ruta Hinds MD on 05/08/2020 at 3:37:36 PM.    Final     Labs:  Basic Metabolic Panel: Recent Labs  Lab 05/09/20 1831  NA 135  K 3.8  CL 100  CO2 25  GLUCOSE 147*  BUN 18  CREATININE 1.39*  CALCIUM 9.1    CBC: Recent Labs  Lab 05/09/20 1831  WBC 9.0  NEUTROABS 5.9  HGB 12.3*  HCT 38.6*  MCV 84.6  PLT 161    CBG: Recent Labs  Lab 05/14/20 2113 05/15/20 0636 05/15/20 1150 05/15/20 1643 05/15/20 2135  GLUCAP 230* 182* 157* 211* 234*   Family history.  Hypertension.  Denies any diabetes mellitus colon cancer or rectal cancer  Brief HPI:   Mikle Sternberg is a 59 y.o. 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  Per chart review lives with children 1 level home 3 steps to entry.  Ambulated modified independent prior to admission.  Family assistance is needed.  Presented 04/17/2020 with increasing right side weakness and dysarthria.  MRI identified small acute infarct of the left pons.  No hemorrhage or mass-effect.  Moderate chronic ischemic microangiopathy.  Patient did not receive TPA.  MRA without emergent large vessel occlusion.  CT angiogram of head and neck showed advanced intracranial atherosclerosis with high-grade narrowing at the supraclinoid right ICA, left A1 origin, bilateral pericallosal arteries  and bilateral PCA branches.  Echocardiogram with ejection fraction of 50%.  Left ventricle demonstrated global hypokinesis.  Admission chemistries BUN 23 creatinine 1.40 urine drug screen negative as well as negative alcohol.  Neurology consulted maintain on aspirin 325 mg daily and Plavix for CVA prophylaxis x3 months then Plavix alone.  Subcutaneous Lovenox for DVT prophylaxis.  Currently on a dysphagia #2 thin liquid diet.  Patient was admitted for a comprehensive rehab program.   Hospital Course: Daymion Nazaire was admitted to rehab 04/20/2020 for inpatient therapies to consist of PT, ST and OT at least three hours five days a week. Past admission physiatrist, therapy team and rehab RN have worked together to provide customized collaborative inpatient rehab.  Pertaining to patient's left paramedian pontine infarction secondary to small vessel disease he remained on aspirin and Plavix therapy.  Also noted history of pontine infarction 2013 residual right-sided weakness.  Patient would follow neurology services.  Subcutaneous Lovenox for DVT prophylaxis no bleeding episodes.  Pain managed with use of Voltaren as well as Neurontin adjusted accordingly.  Mood stabilization currently maintained on Remeron/Prozac as well as Invega with noted history of schizoaffective disorder and patient did also receive follow-up by neuropsychology.  He exhibited no signs of fluid overload.  Blood pressure remained controlled and monitored maintained on Cozaar, Norvasc as well as hydralazine.  Was recommended follow-up outpatient with PCP.  His overall blood sugars controlled maintained on Glucophage and hemoglobin A1c 7.7 full diabetic teaching.  Patient did have a history of polysubstance abuse tobacco alcohol his urine drug screens were negative he continued received counts regards to cessation of these illicit drug products.  He remained on a dysphagia #2 thin liquid diet no signs of aspiration follow-up per speech  therapy.   Blood pressures were monitored on TID basis and controlled  Diabetes has been monitored with ac/hs CBG checks and SSI was use prn for tighter BS control.    Rehab course: During patient's stay in rehab weekly team conferences were held to monitor patient's progress, set goals and discuss barriers to discharge. At admission, patient required moderate assist for ambulation, moderate assist for side-lying to sitting.  Minimal assist upper body bathing mod assist lower body bathing minimal assist upper body dressing mod assist lower body dressing  Physical exam.  Blood pressure 118/70 pulse 80 temperature 98 respirations 18 oxygen saturation  92% room air Constitutional alert and oriented sitting up in bed HEENT Head.  Normocephalic and atraumatic Neck.  Supple nontender no JVD without thyromegaly Eyes.  Pupils round and reactive to light no discharge.nystagmus Neck.  Supple nontender no JVD without thyromegaly Cardiac regular rate rhythm without any extra sounds or murmur heard Abdomen.  Soft nontender positive bowel sounds without rebound Musculoskeletal normal range of motion Right upper extremity deltoids biceps triceps wrist extension grip 4 -/5 finger abduction 3/5 Left upper extremity 5/5 in all same muscles Right lower extremity hip flexion 3 -/5 knee extension knee flexion 3/5 dorsi flexion plantar flexion 2/5 Left lower extremity 5/5 in same muscles Neurologic alert oriented speech mildly dysarthric but intelligible provides name and age follows commands   He/  has had improvement in activity tolerance, balance, postural control as well as ability to compensate for deficits. He/ has had improvement in functional use RUE/LUE  and RLE/LLE as well as improvement in awareness.  Continues to require moderate assist for all functional bed mobility able to perform sliding board transfers with moderate assist and second person for safety.  Able to perform squat pivot to the left  with moderate assist for control and safety.  Full family teaching ongoing.  Patient completed supine to sit edge of bed with moderate assist to start sessions worked on squat pivot transfers to wheelchair as well as drop arm commode.  Moderate assist needed for transfers to the right for each with increased pusher tendencies to the right as well.  Patient was able to communicate his needs.  Full family teaching completed in regards to current diet.  Family teaching completed and plan discharge to home       Disposition: Discharged home    Diet: Dysphagia #2 thin liquids  Special Instructions: Continue aspirin and Plavix x3 months then Plavix alone  No driving smoking or alcohol  Medications at discharge. 1.  Tylenol as needed 2.  Norvasc 10 mg p.o. daily 3.  Ecotrin 325 mg p.o. daily 4.  Lipitor 40 mg p.o. daily 5.  Coreg 25 mg p.o. twice daily 6.  Plavix 75 mg p.o. daily 7.  Flexeril 5 mg 3 times daily as needed muscle spasms 8.  Voltaren gel 2 g 4 times daily to affected area 9.  Prozac 10 mg p.o. daily 10.  Neurontin 600 mg p.o. 3 times daily 11.  Hydralazine 25 mg every 8 hours 12.  Atarax 25 mg p.o. twice daily 13.  Cozaar 100 mg p.o. daily 14.  Glucophage 500 mg p.o. twice daily 15.  Remeron 7.5 mg p.o. daily 16.  Invega 3 mg p.o. daily 17.  Protonix 40 mg p.o. daily  30-35 minutes were spent completing discharge summary and discharge planning  Discharge Instructions    Ambulatory referral to Neurology   Complete by: As directed    An appointment is requested in approximately 4 weeks left [facility]   Ambulatory referral to Neurology   Complete by: As directed    An appointment is requested in approximately: 4 weeks left [facility]   Ambulatory referral to Physical Medicine Rehab   Complete by: As directed    Moderate complexity follow-up 1 to 2 weeks left [facility] pont[facility]A       Follow-up Information    Kirsteins, Luanna Salk, MD  Follow up.   Specialty: Physical Medicine and Rehabilitation Why: Office to call for appointment Contact information: [place] [address] [phone]  Signed: Lavon Paganini [place] 05/16/2020, 5:13 AM

## 2020-05-15 NOTE — Progress Notes (Signed)
Physical Therapy Discharge Summary  Patient Details  Name: [name] MRN: [id number] Date of Birth: 02/10/1961  Today's Date: 05/15/2020 PT Individual Time: 3299-2426 PT Individual Time Calculation (min): 50 min  PT Missed Time: 10 min Missed Time Reason: patient fatigue   Patient has met 4 of 8 long term goals due to improved activity tolerance, improved balance, improved postural control and ability to compensate for deficits.  Patient to discharge at a wheelchair level [place].   Patient's care partner is independent to provide the necessary physical assistance at discharge. [name] daughter and cousin have completed family education sessions. [name] cousin has demonstrated the ability to physically assist pt upon d/c home.  Reasons goals not met: Pt did not meet bed mobility, transfer, and car transfer goals of min A as he can require up to mod A with bed mobility and transfers and up to max A with car transfers. Pt also did not meet dynamic standing balance goal of mod A as he can require up to max A for this.  Recommendation:  Patient will benefit from ongoing skilled PT services in home health setting to continue to advance safe functional mobility, address ongoing impairments in endurance, strength, balance, safety, independence with functional mobility, R hemibody inattention, pusher syndrome, and minimize fall risk.  Equipment: hospital bed, manual wheelchair  Reasons for discharge: treatment goals met and discharge from hospital  Patient/family agrees with progress made and goals achieved: Yes   Skilled Intervention: Pt received seated in bed, agreeable to PT session. No complaints of pain. [name] daughter present for hands-on family education at beginning of session and pt's cousin arrives during session for continued family education. Per family and OT report they were also present during AM session with OT for hands-on training. [name] daughter declines to perform  hands-on transfers this session due to current physical limitations but able to observe during session. Pt is mod A for bed mobility with use of hospital bed features. Recommending use of a hospital bed upon d/c home but also provided handout for bedrail in case family is unable to obtain hospital bed. Once seated EOB pt is dependent to don shoes. Squat pivot transfer bed to w/c with mod A to the R with heavy pushing to the R during transfer. Reviewed management of w/c parts with pt's family and pt's family is independent with management of w/c parts. Car transfer with mod A to the L into car and max A to the R back out of car at simulation sedan height car. [name] family declines to perform hands-on car transfers. Discussed body mechanics and where to stand to assist pt in/out of car as well as what pt can safely hold onto to assist with transfer. Demonstrated anti-tipper management and how to bump pt up/down curb step. [name] cousin is able to perform return demo and safely return anti-tippers into position following curb management. [name] cousin is able to assist pt with squat pivot transfer back to bed at end of session with mod A. Sit to supine mod A for BLE management. [name] daughter and cousin with no further questions and are comfortable with current assist level that pt requires. Pt left semi-reclined in bed with needs in reach, bed alarm in place, family present at end of session.  PT Discharge Precautions/Restrictions Precautions Precautions: Fall Precaution Comments: pusher, right hemiparesis, right hip/thigh pain Restrictions Weight Bearing Restrictions: No Vision/Perception  Perception Perception: Impaired Inattention/Neglect: Does not attend to right visual field Spatial Orientation: impaired midline orientation (  Pusher tendencies) Praxis Praxis: Impaired Praxis Impairment Details: Ideomotor;Motor planning  Cognition Overall Cognitive Status: Impaired/Different from  baseline Arousal/Alertness: Lethargic Orientation Level: Oriented X4 Attention: Focused Focused Attention: Appears intact Sustained Attention: Impaired Memory: Impaired Memory Impairment: Decreased recall of new information;Decreased short term memory Awareness: Impaired Awareness Impairment: Emergent impairment Problem Solving: Impaired Safety/Judgment: Impaired Sensation Sensation Light Touch: Appears Intact Proprioception: Impaired by gross assessment (impaired awareness of R hemibody) Coordination Gross Motor Movements are Fluid and Coordinated: No Fine Motor Movements are Fluid and Coordinated: No Coordination and Movement Description: R hemiparesis (LE>UE) with impaired R attention, impaired motor planning, and impaired midline orientation Motor  Motor Motor: Abnormal postural alignment and control;Hemiplegia;Motor impersistence Motor - Skilled Clinical Observations: Pushing to the right side with L UE and right RUE and LE hemiparesis Motor - Discharge Observations: Rt pusher, Rt hemiparesis  Mobility Bed Mobility Bed Mobility: Rolling Right;Rolling Left;Supine to Sit;Sit to Supine Rolling Right: Moderate Assistance - Patient 50-74% Rolling Left: Moderate Assistance - Patient 50-74% Left Sidelying to Sit: Moderate Assistance - Patient 50-74% Supine to Sit: Moderate Assistance - Patient 50-74% Sit to Supine: Moderate Assistance - Patient 50-74% Transfers Transfers: Squat Pivot Transfers Sit to Stand: Moderate Assistance - Patient 50-74% Stand to Sit: Moderate Assistance - Patient 50-74% Stand Pivot Transfers: Maximal Assistance - Patient 25 - 49% Squat Pivot Transfers: Moderate Assistance - Patient 50-74% Locomotion  Gait Ambulation: Yes Gait Assistance: 2 Helpers Gait Distance (Feet): 12 Feet Assistive device: Other (Comment) (rail in hallway) Gait Assistance Details: Verbal cues for sequencing;Verbal cues for technique;Verbal cues for precautions/safety;Verbal cues  for gait pattern;Verbal cues for safe use of DME/AE;Manual facilitation for weight shifting;Manual facilitation for placement Gait Gait: Yes Gait Pattern: Impaired Gait Pattern: Step-to pattern Gait velocity: decreased Stairs / Additional Locomotion Stairs: No Wheelchair Mobility Wheelchair Mobility: Yes Wheelchair Assistance: Moderate Assistance - Patient 50 - 74% Wheelchair Propulsion: Left upper extremity;Left lower extremity Wheelchair Parts Management: Needs assistance Distance: 75  Trunk/Postural Assessment  Cervical Assessment Cervical Assessment: Exceptions to [facility] (forward head) Thoracic Assessment Thoracic Assessment: Exceptions to [facility] (rounded shoulders) Lumbar Assessment Lumbar Assessment: Exceptions to [facility] (posterior pelvic tilt) Postural Control Postural Control: Deficits on evaluation (R pusher) Trunk Control: leans to the R in sitting Righting Reactions: delayed and insufficient with LOB to the right Protective Responses: delayed and insufficient Postural Limitations: decreased with impaired midline orientation  Balance Balance Balance Assessed: Yes Static Sitting Balance Static Sitting - Balance Support: Feet supported Static Sitting - Level of Assistance: 5: Stand by assistance Dynamic Sitting Balance Dynamic Sitting - Balance Support: Feet supported;During functional activity Dynamic Sitting - Level of Assistance: 4: Min assist Static Standing Balance Static Standing - Balance Support: During functional activity Static Standing - Level of Assistance: 2: Max assist Dynamic Standing Balance Dynamic Standing - Balance Support: During functional activity Dynamic Standing - Level of Assistance: 2: Max assist Extremity Assessment  RUE Assessment General Strength Comments: Brunnstrom Stage V LUE Assessment LUE Assessment: Within Functional Limits RLE Assessment RLE Assessment: Exceptions to [facility] Passive Range of Motion (PROM) Comments: WFL General Strength  Comments: impaired, see below RLE Strength Right Hip Flexion: 2-/5 Right Hip Extension: 2-/5 Right Hip ABduction: 4+/5 Right Hip ADduction: 4+/5 Right Knee Flexion: 2-/5 Right Knee Extension: 2-/5 Right Ankle Dorsiflexion: 0/5 Right Ankle Plantar Flexion: 0/5 LLE Assessment LLE Assessment: Exceptions to [facility] General Strength Comments: impaired, see below LLE Strength Left Hip Flexion: 3/5 (limited by pain with movement) Left Hip Extension: 4-/5 Left Hip ABduction: 4/5 Left  Hip ADduction: 4/5 Left Knee Flexion: 4+/5 Left Knee Extension: 4+/5 Left Ankle Dorsiflexion: 4+/5 Left Ankle Plantar Flexion: 4+/5     Excell Seltzer, PT, DPT 05/15/2020, 4:27 PM

## 2020-05-15 NOTE — Discharge Instructions (Signed)
Inpatient Rehab Discharge Instructions  [name] Discharge date and time: No discharge date for patient encounter.   Activities/Precautions/ Functional Status: Activity: activity as tolerated Diet:  Wound Care: none needed Functional status:  ___ No restrictions     ___ Walk up steps independently ___ 24/7 supervision/assistance   ___ Walk up steps with assistance ___ Intermittent supervision/assistance  ___ Bathe/dress independently ___ Walk with walker     _x__ Bathe/dress with assistance ___ Walk Independently    ___ Shower independently ___ Walk with assistance    ___ Shower with assistance _x__ No alcohol     ___ Return to work/school ________ COMMUNITY REFERRALS UPON DISCHARGE:    Home Health:   PT     OT     ST                    Agency: [facility] Phone: [phone]              Medical Equipment/Items Ordered: Uncertain Hospital bed                                                  Agency/Supplier: [facility]   Special Instructions: Continue aspirin 325 mg daily and Plavix 75 mg daily x3 months then Plavix alone STROKE/TIA DISCHARGE INSTRUCTIONS SMOKING Cigarette smoking nearly doubles your risk of having a stroke & is the single most alterable risk factor  If you smoke or have smoked in the last 12 months, you are advised to quit smoking for your health.  Most of the excess cardiovascular risk related to smoking disappears within a year of stopping.  Ask you doctor about anti-smoking medications  [place] Quit Line: 1-800-QUIT NOW  Free Smoking Cessation Classes [phone]  CHOLESTEROL Know your levels; limit fat & cholesterol in your diet  Lipid Panel     Component Value Date/Time   CHOL 195 04/18/2020 0556   TRIG 183 (H) 04/18/2020 0556   HDL 44 04/18/2020 0556   CHOLHDL 4.4 04/18/2020 0556   VLDL 37 04/18/2020 0556   LDLCALC 114 (H) 04/18/2020 0556      Many patients benefit from treatment even if their cholesterol  is at goal.  Goal: Total Cholesterol (CHOL) less than 160  Goal:  Triglycerides (TRIG) less than 150  Goal:  HDL greater than 40  Goal:  LDL (LDLCALC) less than 100   BLOOD PRESSURE American Stroke Association blood pressure target is less that 120/80 mm/Hg  Your discharge blood pressure is:  BP: (!) 168/106  Monitor your blood pressure  Limit your salt and alcohol intake  Many individuals will require more than one medication for high blood pressure  DIABETES (A1c is a blood sugar average for last 3 months) Goal HGBA1c is under 7% (HBGA1c is blood sugar average for last 3 months)  Diabetes:   Lab Results  Component Value Date   HGBA1C 7.7 (H) 04/18/2020     Your HGBA1c can be lowered with medications, healthy diet, and exercise.  Check your blood sugar as directed by your physician  Call your physician if you experience unexplained or low blood sugars.  PHYSICAL ACTIVITY/REHABILITATION Goal is 30 minutes at least 4 days per week  Activity: Increase activity slowly, Therapies: Physical Therapy: Home Health Return to work:   Activity decreases your risk of heart attack  and stroke and makes your heart stronger.  It helps control your weight and blood pressure; helps you relax and can improve your mood.  Participate in a regular exercise program.  Talk with your doctor about the best form of exercise for you (dancing, walking, swimming, cycling).  DIET/WEIGHT Goal is to maintain a healthy weight  Your discharge diet is:  Diet Order            DIET DYS 2 Room service appropriate? Yes with Assist; Fluid consistency: Thin  Diet effective now              liquids Your height is:  Height: 5\' 11"  (180.3 cm) Your current weight is: Weight: 98.7 kg Your Body Mass Index (BMI) is:  BMI (Calculated): 30.36  Following the type of diet specifically designed for you will help prevent another stroke.  Your goal weight range is:    Your goal Body Mass Index (BMI) is  19-24.  Healthy food habits can help reduce 3 risk factors for stroke:  High cholesterol, hypertension, and excess weight.  RESOURCES Stroke/Support Group:  Call [phone]   STROKE EDUCATION PROVIDED/REVIEWED AND GIVEN TO PATIENT Stroke warning signs and symptoms How to activate emergency medical system (call 911). Medications prescribed at discharge. Need for follow-up after discharge. Personal risk factors for stroke. Pneumonia vaccine given:  Flu vaccine given:  My questions have been answered, the writing is legible, and I understand these instructions.  I will adhere to these goals & educational materials that have been provided to me after my discharge from the hospital.      My questions have been answered and I understand these instructions. I will adhere to these goals and the provided educational materials after my discharge from the hospital.  Patient/Caregiver Signature _______________________________ Date __________  Clinician Signature _______________________________________ Date __________  Please bring this form and your medication list with you to all your follow-up doctor's appointments.

## 2020-05-15 NOTE — Progress Notes (Signed)
Patient ID: [name], male   DOB: [birth year], 59 y.o.   MRN: [id number]   Hospital bed order submitted.

## 2020-05-16 LAB — BASIC METABOLIC PANEL
Anion gap: 11 (ref 5–15)
BUN: 23 mg/dL — ABNORMAL HIGH (ref 6–20)
CO2: 27 mmol/L (ref 22–32)
Calcium: 9.3 mg/dL (ref 8.9–10.3)
Chloride: 100 mmol/L (ref 98–111)
Creatinine, Ser: 1.24 mg/dL (ref 0.61–1.24)
GFR calc Af Amer: >60 mL/min (ref >60)
GFR calc non Af Amer: >60 mL/min (ref >60)
Glucose, Bld: 188 mg/dL — ABNORMAL HIGH (ref 70–99)
Potassium: 4.1 mmol/L (ref 3.5–5.1)
Sodium: 138 mmol/L (ref 135–145)

## 2020-05-16 LAB — GLUCOSE, CAPILLARY
Glucose-Capillary: 177 mg/dL — ABNORMAL HIGH (ref 70–99)
Glucose-Capillary: 248 mg/dL — ABNORMAL HIGH (ref 70–99)

## 2020-05-16 NOTE — Progress Notes (Signed)
1100 Pt was cleaned and informed to call front desk when family arrived.  37 Pt was given a belongings bag, educated according to care plan, and PA was informed to complete AVS education. [name] blood pressure was rechecked and stable before discharge and the patient expresses no needs at this time.

## 2020-05-16 NOTE — Progress Notes (Signed)
Inpatient Rehabilitation Care Coordinator  Discharge Note  The overall goal for the admission was met for:   Discharge location: Yes, Home (Daughter's Home)  Length of Stay: Yes, 26 Days  Discharge activity level: Yes, MOD A at Wheelchair   Home/community participation: Yes  Services provided included: MD, RD, PT, OT, SLP, RN, CM, TR, Pharmacy, [place]: Medicaid  Follow-up services arranged: Home Health: [facility]  Comments (or additional information): PT OT ST  Patient/Family verbalized understanding of follow-up arrangements: Yes  Individual responsible for coordination of the follow-up plan: Katharine Look [phone]  Confirmed correct DME delivered: Dyanne Iha 05/16/2020    Dyanne Iha

## 2020-05-16 NOTE — Progress Notes (Signed)
[place] [facility] PHYSICAL MEDICINE & REHABILITATION PROGRESS NOTE   Subjective/Complaints: Patient was safely discharged home today. Medications reviewed with patient and family.  BMP reviewed and stable. He may benefit from outpatient epidural steroid injection by Dr. Letta Pate for left lower extremity pain secondary to stenosis following DAPT completion- discussed with patient and his family  ROS: Denies CP, SOB, N/V/D  Objective:   No results found. No results for input(s): WBC, HGB, HCT, PLT in the last 72 hours. Recent Labs    05/16/20 0609  NA 138  K 4.1  CL 100  CO2 27  GLUCOSE 188*  BUN 23*  CREATININE 1.24  CALCIUM 9.3    Intake/Output Summary (Last 24 hours) at 05/16/2020 1455 Last data filed at 05/16/2020 0900 Gross per 24 hour  Intake 960 ml  Output 1875 ml  Net -915 ml     Physical Exam: Vital Signs Blood pressure 140/87, pulse 84, temperature 98.3 F (36.8 C), resp. rate 18, height 5\' 11"  (1.803 m), weight 99 kg, SpO2 100 %.  General: Alert, No apparent distress HEENT: Head is normocephalic, atraumatic, PERRLA, EOMI, sclera anicteric, oral mucosa pink and moist, dentition intact, ext ear canals clear,  Neck: Supple without JVD or lymphadenopathy Heart: Reg rate and rhythm. No murmurs rubs or gallops Chest: CTA bilaterally without wheezes, rales, or rhonchi; no distress Abdomen: Soft, non-tender, non-distended, bowel sounds positive. Extremities: No clubbing, cyanosis, or edema. Pulses are 2+ Skin: Clean and intact without signs of breakdown Neuro: Alert Dysarthria Motor:  RUE: 3+/5 proximal to distal RLE: HF, KE 2+/4 ADF 0/5 LLE 4/5 HF, KE ADF  Severe dysarthria  Right facial weakness, stable   Assessment/Plan: 1. Functional deficits secondary to Left pontine infarct which require 3+ hours per day of interdisciplinary therapy in a comprehensive inpatient rehab setting.  Physiatrist is providing close team supervision and 24 hour management of active  medical problems listed below.  Physiatrist and rehab team continue to assess barriers to discharge/monitor patient progress toward functional and medical goals  Care Tool:  Bathing    Body parts bathed by patient: Right arm, Left arm, Chest, Abdomen, Front perineal area, Right upper leg, Left upper leg, Face, Left lower leg   Body parts bathed by helper: Buttocks, Right lower leg Body parts n/a: Right lower leg, Left lower leg, Buttocks (did not attempt this session)   Bathing assist Assist Level: Maximal Assistance - Patient 24 - 49% (per most recent staff documentation)     Upper Body Dressing/Undressing Upper body dressing   What is the patient wearing?: Pull over shirt    Upper body assist Assist Level: Moderate Assistance - Patient 50 - 74% (per most recent staff documentation)    Lower Body Dressing/Undressing Lower body dressing      What is the patient wearing?: Pants, Incontinence brief     Lower body assist Assist for lower body dressing: Maximal Assistance - Patient 25 - 49% (per most recent staff documentation)     Toileting Toileting    Toileting assist Assist for toileting: Maximal Assistance - Patient 25 - 49% (per most recent staff documentation)     Transfers Chair/bed transfer  Transfers assist     Chair/bed transfer assist level: Moderate Assistance - Patient 50 - 74%     Locomotion Ambulation   Ambulation assist   Ambulation activity did not occur: Safety/medical concerns  Assist level: 2 helpers Assistive device: Walker-rolling Max distance: 42ft   Walk 10 feet activity   Assist  Walk  10 feet activity did not occur: Safety/medical concerns  Assist level: 2 helpers Assistive device: Walker-rolling   Walk 50 feet activity   Assist Walk 50 feet with 2 turns activity did not occur: Safety/medical concerns         Walk 150 feet activity   Assist Walk 150 feet activity did not occur: Safety/medical concerns          Walk 10 feet on uneven surface  activity   Assist Walk 10 feet on uneven surfaces activity did not occur: Safety/medical concerns         Wheelchair     Assist Will patient use wheelchair at discharge?: Yes Type of Wheelchair: Manual    Wheelchair assist level: Moderate Assistance - Patient 50 - 74% Max wheelchair distance: 79ft    Wheelchair 50 feet with 2 turns activity    Assist        Assist Level: Moderate Assistance - Patient 50 - 74%   Wheelchair 150 feet activity     Assist  Wheelchair 150 feet activity did not occur: Safety/medical concerns       Blood pressure 140/87, pulse 84, temperature 98.3 F (36.8 C), resp. rate 18, height 5\' 11"  (1.803 m), weight 99 kg, SpO2 100 %.  Medical Problem List and Plan: 1.  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  Recommendations a 30-day cardiac event monitor as outpatient  Continue CIR   PRAFO qhs 2.  Antithrombotics: -DVT/anticoagulation: Lovenox             -antiplatelet therapy: Aspirin 325 mg and Plavix 75 mg daily x3 months then Plavix alone 3. Pain Management: Voltaren gel 4 times daily, Medrol dose pack completed  Medrol dose pack  -LLE pain chronic with normal L hip MRI 2019, ? CVA related vs sciatic, increased gabapentin,  lumbar films show disc height loss L5-S1, no spondylolisthesis.  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 Discussed with patient and his family.  4. Mood: Cymbalta 30 mg twice daily discontinued  Fluoxetine DC'd on 6/28 due to interaction with Plavix             -antipsychotic agents: Invega 3 mg daily 5. Neuropsych: This patient is capable of making decisions on his own behalf. 6. Skin/Wound Care: Routine skin  checks 7. Fluids/Electrolytes/Nutrition: Routine in and outs with follow-up chemistries 8.  Diastolic congestive heart failure.  Monitor for any signs of fluid overload Filed Weights   05/14/20 0549 05/15/20 0509 05/16/20 0320  Weight: 99.5 kg 99 kg 99 kg   Stable on 6/29 9.  Diabetes mellitus with peripheral neuropathy.  Hemoglobin A1c 7.7.  SSI.    Glucophage 500 mg twice daily prior to admission, resumed on 6/4 CBG (last 3)  Recent Labs    05/15/20 2135 05/16/20 0629 05/16/20 1119  GLUCAP 234* 177* 248*   6/29 elevated: continue low carb diet education in outpatient setting.  10.  Hypertension.  Patient on Coreg 25 mg twice daily, Norvasc 10 mg daily, hydralazine 25 mg 3 times daily, Cozaar 50 mg daily prior to admission.  Resume as needed Vitals:   05/16/20 0320 05/16/20 1036  BP: (!) 155/116 140/87  Pulse: 75 84  Resp: 17 18  Temp: 98.3 F (36.8  C)   SpO2: 99% 100%   Amlodipine 5 started on 6/4, increase to 10 on 6/7, on Coreg 25mg  BID   Cozaar  50mg  ,increase to 100mg  as of 6/24  Added hydralazine 10mg  tid increased to QID on 6/14, will increase to 25mg  tid   Elevated, but?  Improving on 6/28, will consider further medications if persistent 11.  Hyperlipidemia.  Lipitor 12.  History of polysubstance tobacco and alcohol abuse.  Urine drug screen negative.  Provide counseling 13.  Post stroke dysphagia  D2, thins, advance diet as tolerated 14.  AKI vs CKD  Creatinine 1.39 on 6/22, labs ordered for tomorrow    26 days A FACE TO FACE EVALUATION WAS PERFORMED  [name] 05/16/2020, 2:55 PM

## 2020-05-18 ENCOUNTER — Telehealth: Payer: Self-pay | Admitting: Registered Nurse

## 2020-05-18 NOTE — Telephone Encounter (Signed)
Placed a call to Ms. Katharine Look and Mr. Furgerson, their mail box are full and unable to accept messages. Will try to call them again

## 2020-05-23 ENCOUNTER — Encounter: Payer: Self-pay | Admitting: *Deleted

## 2020-05-23 NOTE — Progress Notes (Signed)
Patient ID: [name], male   DOB: 10-04-1961, 59 y.o.   MRN: [id number] Patient enrolled for [employer] to ship a 30 day cardiac event monitor to his home.  Instructions also mailed to the patients home.

## 2020-05-24 ENCOUNTER — Telehealth: Payer: Self-pay

## 2020-05-24 NOTE — Telephone Encounter (Signed)
Katharine Look called for patient. Not exactly sure what it is about. Called Katharine Look back no answer, mailbox full.

## 2020-05-26 ENCOUNTER — Encounter: Payer: Medicaid Other | Attending: Registered Nurse | Admitting: Registered Nurse

## 2020-06-05 ENCOUNTER — Encounter (HOSPITAL_BASED_OUTPATIENT_CLINIC_OR_DEPARTMENT_OTHER): Payer: Medicaid Other | Admitting: Registered Nurse

## 2020-06-05 ENCOUNTER — Encounter: Payer: Self-pay | Admitting: Registered Nurse

## 2020-06-05 ENCOUNTER — Other Ambulatory Visit: Payer: Self-pay

## 2020-06-05 VITALS — BP 121/80 | HR 101 | Ht 71.0 in | Wt 221.0 lb

## 2020-06-05 DIAGNOSIS — I6350 Cerebral infarction due to unspecified occlusion or stenosis of unspecified cerebral artery: Secondary | ICD-10-CM

## 2020-06-05 DIAGNOSIS — I10 Essential (primary) hypertension: Secondary | ICD-10-CM

## 2020-06-05 DIAGNOSIS — I639 Cerebral infarction, unspecified: Secondary | ICD-10-CM

## 2020-06-05 DIAGNOSIS — I6930 Unspecified sequelae of cerebral infarction: Secondary | ICD-10-CM | POA: Diagnosis not present

## 2020-06-05 DIAGNOSIS — E7849 Other hyperlipidemia: Secondary | ICD-10-CM | POA: Diagnosis not present

## 2020-06-05 NOTE — Progress Notes (Signed)
Subjective:    Patient ID: [name], male    DOB: 1961-11-06, 59 y.o.   MRN: [id number]  HPI: [name] is a 59 y.o. male whose appointment is a Web-ex visit. Family having transportaion difficulties we discussed [employer], his daughter verbalizes understanding. This visit is for follow up of his Left Pontine cerebrovascular accident, History of CVA with residual deficit, Essential Hypertension and Hyperlipidemia. He presented to the Emergency room on 04/17/2020 with complaints of increase right sided weakness and dysarthria. Neurology consulted.   CT Head : Code Stroke:  IMPRESSION: 1. No acute intracranial abnormality. 2. ASPECTS is 10. 3. Chronic ischemic microangiopathy and small vessel infarcts of the left basal ganglia that are likely old.  MR Brain WO Contrast:  IMPRESSION: 1. Small acute infarct of the left pons. No hemorrhage or mass effect. 2. Punctate focus of DWI hyperintensity in the right frontal white matter without a clear correlate on ADC. This may indicate a small acute or subacute infarct. 3. Moderate chronic ischemic microangiopathy. 4. Multifocal chronic microhemorrhage within the cerebellum and both cerebral hemispheres.  MR Angio Head WO Contrast:  IMPRESSION: Motion degraded study without emergent large vessel occlusion.  [name] was admitted to inpatient Rehabilitation on 04/20/2020, and discharged home on 05/16/2020. Spoke with daughter Katharine Look she hasn't heard from [facility], she called them she states and was told they didn't have a referral. This provider sent an E-mail to [place] regarding the above and asked if she can look into the Home Health issue and to call Katharine Look Ms. [place] daughter.   [name] denies any pain. He rates his pain 0.  Spoke to [place] regarding [name] being hypertensive, she states he is compliant with his medication. We discussed checking his blood  pressure again and how to placed the blood pressure cuff, she admits, the blood pressure cuff was on the wrong way. She checked his blood pressure again and she reports the reading was 121/80.   Ms. Katharine Look ( daughter) very tearful, I asked her to have a family meeting with her siblings to assist with the care of [name] and once [place] is established to ask for a Social worker to assist with his emotional needs and family issues, she verbalizes understanding.      Pain Inventory Average Pain 0 Pain Right Now 0 My pain is no pain  In the last 24 hours, has pain interfered with the following? General activity 0 Relation with others 0 Enjoyment of life 0 What TIME of day is your pain at its worst? night Sleep (in general) Good  Pain is worse with: unsure Pain improves with: medication Relief from Meds: 9  Mobility use a wheelchair needs help with transfers  Function disabled: date disabled .  Neuro/Psych bladder control problems bowel control problems weakness trouble walking spasms dizziness confusion depression anxiety  Prior Studies hospital f/u  Physicians involved in your care hospital f/u   Family History  Problem Relation Age of Onset  . Hypertension Other    Social History   Socioeconomic History  . Marital status: Legally Separated    Spouse name: Not on file  . Number of children: Not on file  . Years of education: Not on file  . Highest education level: Not on file  Occupational History  . Not on file  Tobacco Use  . Smoking status: Current Every Day Smoker    Packs/day: 0.25    Types: Cigarettes  . Smokeless  tobacco: Never Used  Substance and Sexual Activity  . Alcohol use: Yes    Alcohol/week: 1.0 - 2.0 standard drink    Types: 1 - 2 Cans of beer per week    Comment: 2 cases of beer a day + liquour  . Drug use: Yes    Types: "Crack" cocaine, Cocaine  . Sexual activity: Not on file  Other Topics Concern  . Not on file    Social History Narrative  . Not on file   Social Determinants of Health   Financial Resource Strain:   . Difficulty of Paying Living Expenses:   Food Insecurity:   . Worried About Charity fundraiser in the Last Year:   . Arboriculturist in the Last Year:   Transportation Needs:   . Film/video editor (Medical):   Marland Kitchen Lack of Transportation (Non-Medical):   Physical Activity:   . Days of Exercise per Week:   . Minutes of Exercise per Session:   Stress:   . Feeling of Stress :   Social Connections:   . Frequency of Communication with Friends and Family:   . Frequency of Social Gatherings with Friends and Family:   . Attends Religious Services:   . Active Member of Clubs or Organizations:   . Attends Archivist Meetings:   Marland Kitchen Marital Status:    Past Surgical History:  Procedure Laterality Date  . LEFT HEART CATH AND CORONARY ANGIOGRAPHY N/A 02/03/2018   Procedure: LEFT HEART CATH AND CORONARY ANGIOGRAPHY;  Surgeon: Jettie Booze, MD;  Location: [place];  Service: Cardiovascular;  Laterality: N/A;  . MANDIBLE RECONSTRUCTION    . TONSILLECTOMY     Past Medical History:  Diagnosis Date  . Alcohol abuse   . Arthritis   . Crack cocaine use   . Depression   . Diabetes mellitus   . Drug abuse ([place])   . DTs (delirium tremens) ([place])    history of   . Gout   . Noncompliance with medication regimen   . Schizophrenia ([place])   . Stroke ([place])   . 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 Uncontrolled hypertension    BP (!) 144/111   Ht 5\' 11"  (1.803 m)   Wt 221 lb (100.2 kg)   BMI 30.82 kg/m   Opioid Risk Score:   Fall Risk Score:  `1  Depression screen PHQ 2/9  Depression screen PHQ 2/9 06/05/2020  Decreased Interest 3  Down, Depressed, Hopeless 2  PHQ - 2 Score 5  Altered sleeping 3  Tired, decreased energy 2  Change in appetite 1  Feeling bad or failure about yourself  1  Trouble concentrating 3  Moving slowly or  fidgety/restless 2  Suicidal thoughts 0  PHQ-9 Score 17   Review of Systems  Constitutional: Positive for appetite change.  HENT: Negative.   Eyes: Negative.   Respiratory: Positive for shortness of breath.   Cardiovascular: Negative.   Gastrointestinal: Positive for diarrhea.       Incontinence  Endocrine: Negative.   Genitourinary:       Incontinence  Musculoskeletal: Positive for gait problem.  Skin: Positive for rash and wound.       bedsore  Neurological: Positive for dizziness, speech difficulty and weakness.  Hematological: Negative.   Psychiatric/Behavioral: Positive for confusion and dysphoric mood. The patient is nervous/anxious.   All other systems reviewed and are negative.      Objective:   Physical  Exam Vitals and nursing note reviewed.  Musculoskeletal:     Comments: No Physical Exam Performed: Virtual Visit           Assessment & Plan:  1.Left Pontine cerebrovascular accident:History of CVA with residual deficit: Awaiting Home Health Therapy with [facility]. Sent Erlene Quan SW an e-mail regarding the above, she will contact Ms. Katharine Look regarding the above. See HPI for detail regarding the above. He has a scheduled appointment with Neurology.  2. Essential Hypertension: Continue current medication regimen. PCP Following.  3. Hyperlipidemia: Continue current medication regimen. PCP following.   F/U with Dr Letta Pate in 4- 6 weeks  Web-Ex Visit Established Patient Location of Patient: In His Home Location of Provider: In the Office Total Time Spent: 30 Minutes

## 2020-06-15 ENCOUNTER — Encounter: Payer: Self-pay | Admitting: Adult Health

## 2020-06-15 ENCOUNTER — Inpatient Hospital Stay: Payer: Medicaid Other | Admitting: Adult Health

## 2020-06-15 NOTE — Progress Notes (Deleted)
[facility] [address] [place]. [place] [address] [phone]       HOSPITAL FOLLOW UP NOTE  [name] Date of Birth:  1961-11-03 Medical Record Number:  [id number]   Reason for Referral:  hospital stroke follow up    SUBJECTIVE:   CHIEF COMPLAINT:  No chief complaint on file.   HPI:   [name] is a 59 y.o. male with history of cocaine abuse, multiple strokes with residual right-sided weakness using walker to walk, depression, diabetes, alcohol abuse, schizophrenia, systolic heart failure, uncontrolled hypertension  who presented on 04/17/2020 with R sided weakness and dysphagia.  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  MRI also showed punctate right frontal juxtacortical WM DWI signal could be T2 shine through vs subacute tiny infarct.  Recommended 30-day cardiac event monitor given possible right frontal infarct.  Recommended DAPT for 3 months then Plavix alone.  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  LDL 114 initiate atorvastatin 40 mg daily.  Uncontrolled DM with A1c 7.7.  Uncontrolled HTN.  Other stroke risk factors include tobacco use, EtOH use, advanced intracranial arthrosclerosis, history of substance abuse, obesity and nonischemic combined systolic and diastolic CHF.  Discharged to CIR for ongoing therapy needs residual deficits of right hemiparesis, dysphagia and dysarthria.  Stroke:   L paramedian pontine infarct secondary to small vessel disease source. Punctate right frontal juxtacortical WM DWI signal could be T2 shine through vs. Subacute tiny infarct.  Code Stroke CT head No acute abnormality. Small vessel disease. Old L basal ganglia infarcts. ASPECTS 10.     MRI  Small L pontine infarct. Punctate R frontal lobe white matter DWI w/o ADC correlate. Moderate small vessel disease.  Multifocal microhemorrhages B cerebellum and cerebral hemispheres.   CTA head & neck advanced intracranial atherosclerosis w/ high-grade narrowing supraclinoid R ICA, L A1 origin, B pericallosal, B PCA branches  2D Echo  EF 45 to 50%  Recommend 30 day cardiac event monitoring as outpt to rule out afib as outpt given possible right frontal tiny infarct  LDL 114  HgbA1c 7.7  Lovenox 40 mg sq daily for VTE prophylaxis  aspirin 81 mg daily and clopidogrel 75 mg daily prior to admission, now on aspirin 325 mg daily and clopidogrel 75 mg daily. Continue DAPT for 3 months and then plavix alone  Therapy recommendations:  CIR  Disposition:   CIR  Today, 06/15/2020, [name] is being seen for hospital follow-up.  Discharged home from [facility] on 05/16/2020 after uneventful 26-day stay.  Residual deficits ***. Currently working with [facility] therapies.    Continues DAPT without bleeding or bruising.  Continues on atorvastatin without myalgias.  Blood pressure today ***.  Glucose levels ***.         ROS:   14 system review of systems performed and negative with exception of ***  PMH:  Past Medical History:  Diagnosis Date  . Alcohol abuse   . Arthritis   . Crack cocaine use   . Depression   . Diabetes mellitus   . Drug abuse ([place])   . DTs (delirium tremens) ([place])    history of   . Gout   . Noncompliance with medication regimen   . Schizophrenia ([place])   . Stroke ([place])   . Systolic heart failure secondary to hypertension ([place])   . Uncontrolled hypertension     PSH:  Past Surgical History:  Procedure Laterality Date  . LEFT HEART CATH AND CORONARY ANGIOGRAPHY N/A 02/03/2018   Procedure: LEFT HEART CATH AND CORONARY ANGIOGRAPHY;  Surgeon: Jettie Booze, MD;  Location: [place] [facility];  Service: Cardiovascular;  Laterality: N/A;  . MANDIBLE RECONSTRUCTION    . TONSILLECTOMY      Social History:  Social History   Socioeconomic History  . Marital status: Legally Separated     Spouse name: Not on file  . Number of children: Not on file  . Years of education: Not on file  . Highest education level: Not on file  Occupational History  . Not on file  Tobacco Use  . Smoking status: Current Every Day Smoker    Packs/day: 0.25    Types: Cigarettes  . Smokeless tobacco: Never Used  Substance and Sexual Activity  . Alcohol use: Yes    Alcohol/week: 1.0 - 2.0 standard drink    Types: 1 - 2 Cans of beer per week    Comment: 2 cases of beer a day + liquour  . Drug use: Yes    Types: "Crack" cocaine, Cocaine  . Sexual activity: Not on file  Other Topics Concern  . Not on file  Social History Narrative  . Not on file   Social Determinants of Health   Financial Resource Strain:   . Difficulty of Paying Living Expenses:   Food Insecurity:   . Worried About Charity fundraiser in the Last Year:   . Arboriculturist in the Last Year:   Transportation Needs:   . Film/video editor (Medical):   Marland Kitchen Lack of Transportation (Non-Medical):   Physical Activity:   . Days of Exercise per Week:   . Minutes of Exercise per Session:   Stress:   . Feeling of Stress :   Social Connections:   . Frequency of Communication with Friends and Family:   . Frequency of Social Gatherings with Friends and Family:   . Attends Religious Services:   . Active Member of Clubs or Organizations:   . Attends Archivist Meetings:   Marland Kitchen Marital Status:   Intimate Partner Violence:   . Fear of Current or Ex-Partner:   . Emotionally Abused:   Marland Kitchen Physically Abused:   . Sexually Abused:     Family History:  Family History  Problem Relation Age of Onset  . Hypertension Other     Medications:   Current Outpatient Medications on File Prior to Visit  Medication Sig Dispense Refill  . acetaminophen (TYLENOL) 325 MG tablet Take 2 tablets (650 mg total) by mouth every 6 (six) hours as needed for mild pain (or Fever >/= 101). 30 tablet 0  . amLODipine (NORVASC) 10 MG tablet  Take 1 tablet (10 mg total) by mouth daily. 30 tablet 0  . aspirin EC 325 MG EC tablet Take 1 tablet (325 mg total) by mouth daily. 30 tablet 0  . atorvastatin (LIPITOR) 40 MG tablet Take 1 tablet (40 mg total) by mouth daily. 30 tablet 0  . carvedilol (COREG) 25 MG tablet Take 1 tablet (25 mg total) by mouth 2 (two) times daily with a meal. 60 tablet 0  . clopidogrel (PLAVIX) 75 MG tablet Take 1 tablet (75 mg total) by mouth daily. 30 tablet 0  . cyclobenzaprine (FLEXERIL) 5 MG tablet Take 1 tablet (5 mg total) by mouth 3 (three) times daily as needed for muscle spasms. 30 tablet 0  . diclofenac Sodium (VOLTAREN) 1 %  GEL Apply 2 g topically 4 (four) times daily. 2 g 0  . ergocalciferol (VITAMIN D2) 1.25 MG (50000 UT) capsule Take 1 capsule (50,000 Units total) by mouth once a week. 4 capsule 0  . FLUoxetine (PROZAC) 10 MG capsule Take 1 capsule (10 mg total) by mouth daily. 30 capsule 3  . folic acid (FOLVITE) 1 MG tablet Take 1 tablet (1 mg total) by mouth daily. 30 tablet 0  . gabapentin (NEURONTIN) 300 MG capsule Take 2 capsules (600 mg total) by mouth 3 (three) times daily. 90 capsule 1  . hydrALAZINE (APRESOLINE) 25 MG tablet Take 1 tablet (25 mg total) by mouth every 8 (eight) hours. 90 tablet 0  . hydrOXYzine (ATARAX/VISTARIL) 25 MG tablet Take 1 tablet (25 mg total) by mouth 2 (two) times daily. 60 tablet 0  . losartan (COZAAR) 100 MG tablet Take 1 tablet (100 mg total) by mouth daily. 30 tablet 1  . metFORMIN (GLUCOPHAGE) 500 MG tablet Take 1 tablet (500 mg total) by mouth 2 (two) times daily with a meal. 60 tablet 0  . mirtazapine (REMERON) 7.5 MG tablet Take 1 tablet (7.5 mg total) by mouth at bedtime. 30 tablet 0  . Multiple Vitamin (DAILY-VITE) TABS Take 1 tablet by mouth daily.    . paliperidone (INVEGA) 3 MG 24 hr tablet Take 1 tablet (3 mg total) by mouth daily. 30 tablet 0  . pantoprazole (PROTONIX) 40 MG tablet Take 1 tablet (40 mg total) by mouth daily. 30 tablet 0  . SENNA  PLUS 8.6-50 MG tablet Take 1 tablet by mouth daily.     No current facility-administered medications on file prior to visit.    Allergies:  No Known Allergies    OBJECTIVE:  Physical Exam  There were no vitals filed for this visit. There is no height or weight on file to calculate BMI. No exam data present  Depression screen [facility] 2/9 06/05/2020  Decreased Interest 3  Down, Depressed, Hopeless 2  PHQ - 2 Score 5  Altered sleeping 3  Tired, decreased energy 2  Change in appetite 1  Feeling bad or failure about yourself  1  Trouble concentrating 3  Moving slowly or fidgety/restless 2  Suicidal thoughts 0  PHQ-9 Score 17     General: well developed, well nourished, seated, in no evident distress Head: head normocephalic and atraumatic.   Neck: supple with no carotid or supraclavicular bruits Cardiovascular: regular rate and rhythm, no murmurs Musculoskeletal: no deformity Skin:  no rash/petichiae Vascular:  Normal pulses all extremities   Neurologic Exam Mental Status: Awake and fully alert. Oriented to place and time. Recent and remote memory intact. Attention span, concentration and fund of knowledge appropriate. Mood and affect appropriate.  Cranial Nerves: Fundoscopic exam reveals sharp disc margins. Pupils equal, briskly reactive to light. Extraocular movements full without nystagmus. Visual fields full to confrontation. Hearing intact. Facial sensation intact. Face, tongue, palate moves normally and symmetrically.  Motor: Normal bulk and tone. Normal strength in all tested extremity muscles. Sensory.: intact to touch , pinprick , position and vibratory sensation.  Coordination: Rapid alternating movements normal in all extremities. Finger-to-nose and heel-to-shin performed accurately bilaterally. Gait and Station: Arises from chair without difficulty. Stance is normal. Gait demonstrates normal stride length and balance Reflexes: 1+ and symmetric. Toes downgoing.      NIHSS  *** Modified Rankin  *** CHA2DS2-VASc *** HAS-BLED ***     ASSESSMENT: Young Mulvey is a 59 y.o. 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  Questionable right frontal infarct T2 shine through vs subacute small infarct and recommended 30-day cardiac event monitor to rule out atrial fibrillation.  Vascular risk factors include HTN, HLD, DM, multiple strokes, tobacco use, EtOH use, history of cocaine use, CHF and advanced intracranial arthrosclerosis.      PLAN:  1. *** : Residual deficit: ***. Continue {anticoagulants:31417}  and ***  for secondary stroke prevention. Close PCP follow up for aggressive stroke risk factor management  2. HTN: BP goal <130/90. Continue f/u with PCP 3. HLD: LDL goal <70. Recent LDL ***. F/u with PCP for management as well as prescribing of statin 4. DMII: A1c goal<7.0. Recent A1c ***. F/u with PCP    Follow up in *** or call earlier if needed   I spent *** minutes of face-to-face and non-face-to-face time with patient.  This included previsit chart review, lab review, study review, order entry, electronic health record documentation, patient education regarding recent stroke, residual deficits, importance of managing stroke risk factors and answered all questions to patient satisfaction     Frann Rider, [facility]  [facility] [address] [place], [place] [address]  Phone [phone] Fax [phone] Note: This document was prepared with digital dictation and possible smart phrase technology. Any transcriptional errors that result from this process are unintentional.

## 2020-06-29 ENCOUNTER — Inpatient Hospital Stay: Payer: Medicaid Other | Admitting: Adult Health

## 2020-06-29 NOTE — Progress Notes (Deleted)
[facility] [address] [place]. [place] [address] [phone]       HOSPITAL FOLLOW UP NOTE  [name] Date of Birth:  09/09/1961 Medical Record Number:  [id number]   Reason for Referral:  hospital stroke follow up    SUBJECTIVE:   CHIEF COMPLAINT:  No chief complaint on file.   HPI:   [name] is a 59 y.o. male with history of cocaine abuse, multiple strokes with residual right-sided weakness using walker to walk, depression, diabetes, alcohol abuse, schizophrenia, systolic heart failure, uncontrolled hypertension  who presented on 04/17/2020 with R sided weakness and dysphagia.  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  MRI also showed punctate right frontal juxtacortical WM DWI signal could be T2 shine through vs subacute tiny infarct.  Recommended 30-day cardiac event monitor given possible right frontal infarct.  Recommended DAPT for 3 months then Plavix alone.  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  LDL 114 initiate atorvastatin 40 mg daily.  Uncontrolled DM with A1c 7.7.  Uncontrolled HTN.  Other stroke risk factors include tobacco use, EtOH use, advanced intracranial arthrosclerosis, history of substance abuse, obesity and nonischemic combined systolic and diastolic CHF.  Discharged to CIR for ongoing therapy needs residual deficits of right hemiparesis, dysphagia and dysarthria.  Stroke:   L paramedian pontine infarct secondary to small vessel disease source. Punctate right frontal juxtacortical WM DWI signal could be T2 shine through vs. Subacute tiny infarct.  Code Stroke CT head No acute abnormality. Small vessel disease. Old L basal ganglia infarcts. ASPECTS 10.     MRI  Small L pontine infarct. Punctate R frontal lobe white matter DWI w/o ADC correlate. Moderate small vessel disease.  Multifocal microhemorrhages B cerebellum and cerebral hemispheres.   CTA head & neck advanced intracranial atherosclerosis w/ high-grade narrowing supraclinoid R ICA, L A1 origin, B pericallosal, B PCA branches  2D Echo  EF 45 to 50%  Recommend 30 day cardiac event monitoring as outpt to rule out afib as outpt given possible right frontal tiny infarct  LDL 114  HgbA1c 7.7  Lovenox 40 mg sq daily for VTE prophylaxis  aspirin 81 mg daily and clopidogrel 75 mg daily prior to admission, now on aspirin 325 mg daily and clopidogrel 75 mg daily. Continue DAPT for 3 months and then plavix alone  Therapy recommendations:  CIR  Disposition:   CIR  Today, 06/29/2020, [name] is being seen for hospital follow-up.  Discharged home from [facility] on 05/16/2020 after uneventful 26-day stay.    Residual deficits ***. Currently working with [facility] therapies.    Continues DAPT without bleeding or bruising.  Continues on atorvastatin without myalgias.  Blood pressure today ***.  Glucose levels ***.  Cardiac monitor ***        ROS:   14 system review of systems performed and negative with exception of ***  PMH:  Past Medical History:  Diagnosis Date  . Alcohol abuse   . Arthritis   . Crack cocaine use   . Depression   . Diabetes mellitus   . Drug abuse ([place])   . DTs (delirium tremens) ([place])    history of   . Gout   . Noncompliance with medication regimen   . Schizophrenia ([place])   . Stroke ([place])   . Systolic heart failure secondary to hypertension ([place])   . Uncontrolled hypertension  PSH:  Past Surgical History:  Procedure Laterality Date  . LEFT HEART CATH AND CORONARY ANGIOGRAPHY N/A 02/03/2018   Procedure: LEFT HEART CATH AND CORONARY ANGIOGRAPHY;  Surgeon: Jettie Booze, MD;  Location: [place] [facility];  Service: Cardiovascular;  Laterality: N/A;  . MANDIBLE RECONSTRUCTION    . TONSILLECTOMY      Social History:  Social History   Socioeconomic History  . Marital  status: Legally Separated    Spouse name: Not on file  . Number of children: Not on file  . Years of education: Not on file  . Highest education level: Not on file  Occupational History  . Not on file  Tobacco Use  . Smoking status: Current Every Day Smoker    Packs/day: 0.25    Types: Cigarettes  . Smokeless tobacco: Never Used  Substance and Sexual Activity  . Alcohol use: Yes    Alcohol/week: 1.0 - 2.0 standard drink    Types: 1 - 2 Cans of beer per week    Comment: 2 cases of beer a day + liquour  . Drug use: Yes    Types: "Crack" cocaine, Cocaine  . Sexual activity: Not on file  Other Topics Concern  . Not on file  Social History Narrative  . Not on file   Social Determinants of Health   Financial Resource Strain:   . Difficulty of Paying Living Expenses:   Food Insecurity:   . Worried About Charity fundraiser in the Last Year:   . Arboriculturist in the Last Year:   Transportation Needs:   . Film/video editor (Medical):   Marland Kitchen Lack of Transportation (Non-Medical):   Physical Activity:   . Days of Exercise per Week:   . Minutes of Exercise per Session:   Stress:   . Feeling of Stress :   Social Connections:   . Frequency of Communication with Friends and Family:   . Frequency of Social Gatherings with Friends and Family:   . Attends Religious Services:   . Active Member of Clubs or Organizations:   . Attends Archivist Meetings:   Marland Kitchen Marital Status:   Intimate Partner Violence:   . Fear of Current or Ex-Partner:   . Emotionally Abused:   Marland Kitchen Physically Abused:   . Sexually Abused:     Family History:  Family History  Problem Relation Age of Onset  . Hypertension Other     Medications:   Current Outpatient Medications on File Prior to Visit  Medication Sig Dispense Refill  . acetaminophen (TYLENOL) 325 MG tablet Take 2 tablets (650 mg total) by mouth every 6 (six) hours as needed for mild pain (or Fever >/= 101). 30 tablet 0  . amLODipine  (NORVASC) 10 MG tablet Take 1 tablet (10 mg total) by mouth daily. 30 tablet 0  . aspirin EC 325 MG EC tablet Take 1 tablet (325 mg total) by mouth daily. 30 tablet 0  . atorvastatin (LIPITOR) 40 MG tablet Take 1 tablet (40 mg total) by mouth daily. 30 tablet 0  . carvedilol (COREG) 25 MG tablet Take 1 tablet (25 mg total) by mouth 2 (two) times daily with a meal. 60 tablet 0  . clopidogrel (PLAVIX) 75 MG tablet Take 1 tablet (75 mg total) by mouth daily. 30 tablet 0  . cyclobenzaprine (FLEXERIL) 5 MG tablet Take 1 tablet (5 mg total) by mouth 3 (three) times daily as needed for muscle spasms. 30 tablet 0  .  diclofenac Sodium (VOLTAREN) 1 % GEL Apply 2 g topically 4 (four) times daily. 2 g 0  . ergocalciferol (VITAMIN D2) 1.25 MG (50000 UT) capsule Take 1 capsule (50,000 Units total) by mouth once a week. 4 capsule 0  . FLUoxetine (PROZAC) 10 MG capsule Take 1 capsule (10 mg total) by mouth daily. 30 capsule 3  . folic acid (FOLVITE) 1 MG tablet Take 1 tablet (1 mg total) by mouth daily. 30 tablet 0  . gabapentin (NEURONTIN) 300 MG capsule Take 2 capsules (600 mg total) by mouth 3 (three) times daily. 90 capsule 1  . hydrALAZINE (APRESOLINE) 25 MG tablet Take 1 tablet (25 mg total) by mouth every 8 (eight) hours. 90 tablet 0  . hydrOXYzine (ATARAX/VISTARIL) 25 MG tablet Take 1 tablet (25 mg total) by mouth 2 (two) times daily. 60 tablet 0  . losartan (COZAAR) 100 MG tablet Take 1 tablet (100 mg total) by mouth daily. 30 tablet 1  . metFORMIN (GLUCOPHAGE) 500 MG tablet Take 1 tablet (500 mg total) by mouth 2 (two) times daily with a meal. 60 tablet 0  . mirtazapine (REMERON) 7.5 MG tablet Take 1 tablet (7.5 mg total) by mouth at bedtime. 30 tablet 0  . Multiple Vitamin (DAILY-VITE) TABS Take 1 tablet by mouth daily.    . paliperidone (INVEGA) 3 MG 24 hr tablet Take 1 tablet (3 mg total) by mouth daily. 30 tablet 0  . pantoprazole (PROTONIX) 40 MG tablet Take 1 tablet (40 mg total) by mouth daily. 30  tablet 0  . SENNA PLUS 8.6-50 MG tablet Take 1 tablet by mouth daily.     No current facility-administered medications on file prior to visit.    Allergies:  No Known Allergies    OBJECTIVE:  Physical Exam  There were no vitals filed for this visit. There is no height or weight on file to calculate BMI. No exam data present  Depression screen [facility] 2/9 06/05/2020  Decreased Interest 3  Down, Depressed, Hopeless 2  PHQ - 2 Score 5  Altered sleeping 3  Tired, decreased energy 2  Change in appetite 1  Feeling bad or failure about yourself  1  Trouble concentrating 3  Moving slowly or fidgety/restless 2  Suicidal thoughts 0  PHQ-9 Score 17     General: well developed, well nourished, seated, in no evident distress Head: head normocephalic and atraumatic.   Neck: supple with no carotid or supraclavicular bruits Cardiovascular: regular rate and rhythm, no murmurs Musculoskeletal: no deformity Skin:  no rash/petichiae Vascular:  Normal pulses all extremities   Neurologic Exam Mental Status: Awake and fully alert. Oriented to place and time. Recent and remote memory intact. Attention span, concentration and fund of knowledge appropriate. Mood and affect appropriate.  Cranial Nerves: Fundoscopic exam reveals sharp disc margins. Pupils equal, briskly reactive to light. Extraocular movements full without nystagmus. Visual fields full to confrontation. Hearing intact. Facial sensation intact. Face, tongue, palate moves normally and symmetrically.  Motor: Normal bulk and tone. Normal strength in all tested extremity muscles. Sensory.: intact to touch , pinprick , position and vibratory sensation.  Coordination: Rapid alternating movements normal in all extremities. Finger-to-nose and heel-to-shin performed accurately bilaterally. Gait and Station: Arises from chair without difficulty. Stance is normal. Gait demonstrates normal stride length and balance Reflexes: 1+ and symmetric. Toes  downgoing.     NIHSS  *** Modified Rankin  *** CHA2DS2-VASc *** HAS-BLED ***     ASSESSMENT: [name] is a 59 y.o.  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  Questionable right frontal infarct T2 shine through vs subacute small infarct and recommended 30-day cardiac event monitor to rule out atrial fibrillation.  Vascular risk factors include HTN, HLD, DM, multiple strokes, tobacco use, EtOH use, history of cocaine use, CHF and advanced intracranial arthrosclerosis.      PLAN:  1. Left paramedian pontine stroke: Residual deficit: ***. Continue {anticoagulants:31417}  and ***  for secondary stroke prevention. Close PCP follow up for aggressive stroke risk factor management  2. Intracranial arthrosclerosis: Complete 3 months DAPT and then Plavix alone as well as ongoing use of statin and importance of aggressive stroke risk factor management.  Currently asymptomatic. 3. HTN: BP goal <130/90. Continue f/u with PCP  4. HLD: LDL goal <70. Recent LDL ***. F/u with PCP for management as well as prescribing of statin 5. Uncontrolled DMII: A1c goal<7.0. Recent A1c 7.7. F/u with PCP 6. Tobacco use:    Follow up in *** or call earlier if needed   I spent *** minutes of face-to-face and non-face-to-face time with patient.  This included previsit chart review, lab review, study review, order entry, electronic health record documentation, patient education regarding recent stroke, residual deficits, importance of managing stroke risk factors and answered all questions to patient satisfaction   Frann Rider, [facility]  [facility] [address] [place], [place] [address]  Phone [phone] Fax [phone] Note: This document was prepared with digital dictation and possible smart phrase technology. Any  transcriptional errors that result from this process are unintentional.

## 2020-07-10 NOTE — Progress Notes (Signed)
Patient ID: [name], male   DOB: [birth year], 59 y.o.   MRN: [id number]     Diagnosis codes: I63.50  Height:    5'11"            Weight:    218lbs      A specialty wheelchair will increase independent with household mobility. Patient unable to perform using a rolling walker due to patient continues to demonstrate the following deficits: muscle weakness and muscle joint tightness, decreased cardiorespiratory endurance and decreased motor planning, decreased midline orientation, decreased initiation, decreased attention, decreased awareness, decreased problem solving, decreased safety awareness, decreased memory and delayed processing, decreased sitting balance, decreased standing balance, decreased postural control and decreased balance strategies and therefore will continue to benefit for a wheelchair to functional independence with mobility. Patient not progressing toward long term goals-patient will be discharging at wheelchair level. A standard wheelchair will not allow patient to safely propel, and lightweight wheelchair will allow pt to self propel in the home.

## 2020-07-11 ENCOUNTER — Encounter: Payer: Medicaid Other | Attending: Registered Nurse | Admitting: Physical Medicine & Rehabilitation

## 2020-07-11 ENCOUNTER — Encounter: Payer: Self-pay | Admitting: Physical Medicine & Rehabilitation

## 2020-07-11 ENCOUNTER — Other Ambulatory Visit: Payer: Self-pay

## 2020-07-11 VITALS — Ht 71.0 in | Wt 220.0 lb

## 2020-07-11 NOTE — Progress Notes (Signed)
Subjective:    Patient ID: [name], male    DOB: [birth year], 58 y.o.   MRN: [id number] 58 y.o. 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  Per chart review lives with children 1 level home 3 steps to entry.  Ambulated modified independent prior to admission.  Family assistance is needed.  Presented 04/17/2020 with increasing right side weakness and dysarthria.  MRI identified small acute infarct of the left pons.  No hemorrhage or mass-effect.  Moderate chronic ischemic microangiopathy.  Patient did not receive TPA.  MRA without emergent large vessel occlusion.  CT angiogram of head and neck showed advanced intracranial atherosclerosis with high-grade narrowing at the supraclinoid right ICA, left A1 origin, bilateral pericallosal arteries and bilateral PCA branches.  Echocardiogram with ejection fraction of 50%.  Left ventricle demonstrated global hypokinesis.  Admission chemistries BUN 23 creatinine 1.40 urine drug screen negative as well as negative alcohol.  Neurology consulted maintain on aspirin 325 mg daily and Plavix for CVA prophylaxis x3 months then Plavix alone  Admit date: 04/20/2020 Discharge date: 05/16/2020  HPI  Pt was  Pain Inventory Average Pain 2 Pain Right Now 10 My pain is intermittent, sharp, burning and stabbing  LOCATION OF PAIN  : Back & left leg pain  BOWEL Number of stools per week: 3-4 Oral laxative use No  Type of laxative  None Enema or suppository use No  History of colostomy No  Incontinent No   BLADDER Wears a diaper In and out cath, frequency NO Able to self cath No  Bladder incontinence Yes  Frequent urination Yes  Leakage with coughing No  Difficulty starting stream No  Incomplete  bladder emptying No    Mobility ability to climb steps?  no do you drive?  no use a wheelchair needs help with transfers  Function disabled: date disabled years   Neuro/Psych bladder control problems weakness numbness trouble walking confusion depression  Prior Studies Any changes since last visit?  no  Physicians involved in your care Any changes since last visit?  no   Family History  Problem Relation Age of Onset   Hypertension Other    Social History   Socioeconomic History   Marital status: Legally Separated    Spouse name: Not on file   Number of children: Not on file   Years of education: Not on file   Highest education level: Not on file  Occupational History   Not on file  Tobacco Use   Smoking status: Current Every Day Smoker    Packs/day: 0.25    Types: Cigarettes   Smokeless tobacco: Never Used  Vaping Use   Vaping Use: Former  Substance and Sexual Activity   Alcohol use: Not Currently    Alcohol/week: 1.0 - 2.0 standard drink    Types: 1 - 2 Cans of beer per week    Comment: 2 cases of beer a day + liquour   Drug use: Not Currently    Types: "Crack" cocaine, Cocaine   Sexual activity: Not on file  Other Topics Concern   Not on file  Social History Narrative   Not on file   Social Determinants of Health   Financial Resource Strain:    Difficulty of Paying Living Expenses: Not on file  Food Insecurity:    Worried About Running Out  of Food in the Last Year: Not on file   Ran Out of Food in the Last Year: Not on file  Transportation Needs:    Lack of Transportation (Medical): Not on file   Lack of Transportation (Non-Medical): Not on file  Physical Activity:    Days of Exercise per Week: Not on file   Minutes of Exercise per Session: Not on file  Stress:    Feeling of Stress : Not on file  Social Connections:    Frequency of Communication with Friends and Family: Not on file   Frequency of Social Gatherings  with Friends and Family: Not on file   Attends Religious Services: Not on file   Active Member of Clubs or Organizations: Not on file   Attends Archivist Meetings: Not on file   Marital Status: Not on file   Past Surgical History:  Procedure Laterality Date   LEFT HEART CATH AND CORONARY ANGIOGRAPHY N/A 02/03/2018   Procedure: LEFT HEART CATH AND CORONARY ANGIOGRAPHY;  Surgeon: Jettie Booze, MD;  Location: [facility];  Service: Cardiovascular;  Laterality: N/A;   MANDIBLE RECONSTRUCTION     TONSILLECTOMY     Past Medical History:  Diagnosis Date   Alcohol abuse    Arthritis    Crack cocaine use    Depression    Diabetes mellitus    Drug abuse ([place])    DTs (delirium tremens) ([place])    history of    Gout    Noncompliance with medication regimen    Schizophrenia ([place])    Stroke ([place])    Systolic heart failure secondary to hypertension ([place])    Uncontrolled hypertension    Ht 5\' 11"  (1.803 m)    Wt 220 lb (99.8 kg) Comment: tele visit   BMI 30.68 kg/m   Opioid Risk Score:   Fall Risk Score:  `1  Depression screen PHQ 2/9  Depression screen PHQ 2/9 06/05/2020  Decreased Interest 3  Down, Depressed, Hopeless 2  PHQ - 2 Score 5  Altered sleeping 3  Tired, decreased energy 2  Change in appetite 1  Feeling bad or failure about yourself  1  Trouble concentrating 3  Moving slowly or fidgety/restless 2  Suicidal thoughts 0  PHQ-9 Score 17   Review of Systems  Constitutional: Negative.   HENT: Negative.   Eyes: Negative.   Respiratory: Negative.   Cardiovascular: Negative.   Gastrointestinal: Negative.   Endocrine: Negative.   Genitourinary: Positive for urgency.  Musculoskeletal: Negative.   Skin: Positive for wound.       Right buttock wound  Allergic/Immunologic: Negative.   Neurological: Negative.   Hematological: Negative.   Psychiatric/Behavioral: Positive for confusion.       Depression  All other systems  reviewed and are negative.      Objective:   Physical Exam        Assessment & Plan:   The patient was scheduled for a WebEx visit, provider joint but patient did not.  Provider called patient on mobile number mailbox full no answer, provider called patient on phone number no answer mailbox full.

## 2020-07-31 ENCOUNTER — Telehealth: Payer: Self-pay | Admitting: Physical Medicine & Rehabilitation

## 2020-07-31 NOTE — Telephone Encounter (Signed)
Patient is having issues regarding hospital bed that was ordered when patient left hospital as well as home health.  No home health has been out to see patient.  Please call patient about these 2.  Thank you.

## 2020-08-10 ENCOUNTER — Encounter: Payer: Medicaid Other | Admitting: Physical Medicine & Rehabilitation

## 2020-08-17 ENCOUNTER — Encounter: Payer: Self-pay | Admitting: Physical Medicine & Rehabilitation

## 2020-08-17 ENCOUNTER — Encounter: Payer: Medicaid Other | Attending: Registered Nurse | Admitting: Physical Medicine & Rehabilitation

## 2020-08-17 ENCOUNTER — Other Ambulatory Visit: Payer: Self-pay

## 2020-08-17 VITALS — BP 151/102 | HR 84 | Temp 98.6°F

## 2020-08-17 DIAGNOSIS — E7849 Other hyperlipidemia: Secondary | ICD-10-CM

## 2020-08-17 DIAGNOSIS — I10 Essential (primary) hypertension: Secondary | ICD-10-CM

## 2020-08-17 DIAGNOSIS — I6930 Unspecified sequelae of cerebral infarction: Secondary | ICD-10-CM

## 2020-08-17 DIAGNOSIS — I6350 Cerebral infarction due to unspecified occlusion or stenosis of unspecified cerebral artery: Secondary | ICD-10-CM | POA: Insufficient documentation

## 2020-08-17 DIAGNOSIS — I639 Cerebral infarction, unspecified: Secondary | ICD-10-CM

## 2020-08-17 NOTE — Patient Instructions (Addendum)
Follow up with primary MD Volney Presser  PCP - General, Nurse Practitioner     Since 04/16/2020    [phone]   If Volney Presser is not there please ask for first available appt with another nurse practitioner at that practice   I also made a referral to Precious Haws MD in [place], [facility] if you prefer

## 2020-08-17 NOTE — Progress Notes (Addendum)
Subjective:    Patient ID: [name], male    DOB: 06-04-1961, 59 y.o.   MRN: [id number] 58 y.o. 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  Per chart review lives with children 1 level home 3 steps to entry.  Ambulated modified independent prior to admission.  Family assistance is needed.  Presented 04/17/2020 with increasing right side weakness and dysarthria.  MRI identified small acute infarct of the left pons.  No hemorrhage or mass-effect.  Moderate chronic ischemic microangiopathy.  Patient did not receive TPA.  MRA without emergent large vessel occlusion.  CT angiogram of head and neck showed advanced intracranial atherosclerosis with high-grade narrowing at the supraclinoid right ICA, left A1 origin, bilateral pericallosal arteries and bilateral PCA branches.  Echocardiogram with ejection fraction of 50%.  Left ventricle demonstrated global hypokinesis.  Admission chemistries BUN 23 creatinine 1.40 urine drug screen negative as well as negative alcohol.  Neurology consulted maintain on aspirin 325 mg daily and Plavix for CVA prophylaxis x3 months then Plavix alone  Admit date: 04/20/2020 Discharge date: 05/16/2020  HPI 59 year old male with history of hypertension, diabetes, hyperlipidemia as well as CHF who suffered a left pontine infarct on 04/17/2020.  He was hospitalized at [facility] and was transferred to the inpatient rehabilitation unit  The patient also suffered a left PL IC infarct in 2020 and was hospitalized at [facility] and treated in their inpatient rehabilitation unit at that time.  Previous physicians at [facility] included 03/09/2019 1:00 PM Levada Schilling, MD Physical Medicine & Rehabilitation - [facility] [place] [address]  03/16/2019 10:30 AM Gala Lewandowsky, MD Internal Medicine - [facility] [place] [id number]   The patient resides in [place], in [place].  He has a caretaker that provides assistance for dressing bathing toileting as well as transfers.  The patient is here in a transfer chair. The patient also has cognitive deficits.  He is not oriented to time, states he is in a hospital although does acknowledge that he is actually in a doctor's office. His caretaker is a cousin who is performing all transfers and ADL care.  He is a total lift according to the cousin.  The patient states that he has not received any home health services.  The cousin endorses this.  The patient also has difficulty with bed mobility and cousin is requesting a hospital bed.  Due to recurrent stroke and need for frequent changes in body position, the patient requires an electric hospital bed.  The patient has a medical condition which requires positioning of the body in ways not feasible with an ordinary bed. Pain Inventory Average Pain 8 Pain Right Now 7 My pain is tingling  LOCATION OF PAIN patient cannot localize, cognitive issues BOWEL  Enema or suppository use No  History of colostomy No  Incontinent No   BLADDER Normal Patient uses urinal Able to self cath No  Bladder incontinence Yes  Frequent urination No  Leakage with coughing No  Difficulty starting stream Yes  Incomplete bladder emptying unsure   Mobility how many minutes can you walk? none ability to climb steps?  no do you drive?  no use a wheelchair needs help with transfers  Function disabled: date disabled . I need assistance with  the following:  feeding, dressing, bathing and toileting  Neuro/Psych trouble walking  Prior Studies Any changes since last visit?  no  Physicians involved in your care Any changes since  last visit?  no   Family History  Problem Relation Age of Onset  . Hypertension Other    Social History   Socioeconomic History  . Marital status: Legally Separated    Spouse name: Not on file  . Number of children: Not on file  . Years of education: Not on file  . Highest education level: Not on file  Occupational History  . Not on file  Tobacco Use  . Smoking status: Current Every Day Smoker    Packs/day: 0.25    Types: Cigarettes  . Smokeless tobacco: Never Used  Vaping Use  . Vaping Use: Former  Substance and Sexual Activity  . Alcohol use: Not Currently    Alcohol/week: 1.0 - 2.0 standard drink    Types: 1 - 2 Cans of beer per week    Comment: 2 cases of beer a day + liquour  . Drug use: Not Currently    Types: "Crack" cocaine, Cocaine  . Sexual activity: Not on file  Other Topics Concern  . Not on file  Social History Narrative  . Not on file   Social Determinants of Health   Financial Resource Strain:   . Difficulty of Paying Living Expenses: Not on file  Food Insecurity:   . Worried About Charity fundraiser in the Last Year: Not on file  . Ran Out of Food in the Last Year: Not on file  Transportation Needs:   . Lack of Transportation (Medical): Not on file  . Lack of Transportation (Non-Medical): Not on file  Physical Activity:   . Days of Exercise per Week: Not on file  . Minutes of Exercise per Session: Not on file  Stress:   . Feeling of Stress : Not on file  Social Connections:   . Frequency of Communication with Friends and Family: Not on file  . Frequency of Social Gatherings with Friends and Family: Not on file  . Attends Religious Services: Not on file  . Active Member of Clubs or Organizations: Not on file  . Attends Archivist Meetings: Not on file  . Marital Status: Not on file   Past Surgical History:  Procedure Laterality Date  . LEFT HEART CATH AND CORONARY ANGIOGRAPHY N/A 02/03/2018   Procedure: LEFT HEART CATH AND  CORONARY ANGIOGRAPHY;  Surgeon: Jettie Booze, MD;  Location: [facility];  Service: Cardiovascular;  Laterality: N/A;  . MANDIBLE RECONSTRUCTION    . TONSILLECTOMY     Past Medical History:  Diagnosis Date  . Alcohol abuse   . Arthritis   . Crack cocaine use   . Depression   . Diabetes mellitus   . Drug abuse ([place])   . DTs (delirium tremens) ([place])    history of   . Gout   . Noncompliance with medication regimen   . Schizophrenia ([place])   . Stroke ([place])   . Systolic heart failure secondary to hypertension ([place])   . Uncontrolled hypertension    BP (!) 151/102   Pulse 84   Temp 98.6 F (37 C)   SpO2 98%   Opioid Risk Score:   Fall Risk Score:  `1  Depression screen PHQ 2/9  Depression screen [facility] 2/9 07/11/2020 06/05/2020  Decreased Interest 1 3  Down, Depressed, Hopeless 1 2  PHQ -  2 Score 2 5  Altered sleeping - 3  Tired, decreased energy - 2  Change in appetite - 1  Feeling bad or failure about yourself  - 1  Trouble concentrating - 3  Moving slowly or fidgety/restless - 2  Suicidal thoughts - 0  PHQ-9 Score - 17    Review of Systems  All other systems reviewed and are negative.      Objective:   Physical Exam Vitals and nursing note reviewed.  Constitutional:      Appearance: He is normal weight.  HENT:     Head: Normocephalic and atraumatic.  Eyes:     General: No visual field deficit.    Extraocular Movements: Extraocular movements intact.     Conjunctiva/sclera: Conjunctivae normal.     Pupils: Pupils are equal, round, and reactive to light.  Musculoskeletal:     Comments: No pain with bilateral shoulder elbow and wrist ROM bilaterally No pain with Hip knee and ankle ROM bilaterally   Skin:    General: Skin is warm and dry.  Neurological:     General: No focal deficit present.     Mental Status: He is alert. He is disoriented.     Cranial Nerves: No dysarthria.     Motor: Weakness present. No tremor or abnormal muscle tone.      Coordination: Coordination abnormal.     Gait: Gait abnormal.     Comments: Neg SLR  4/5 strength Bilateral Delt, Bi , Tri, Grip, HF, KE ADF  Non amb due to balance            Assessment & Plan:  1.  Hx of recent left pontine infarct with prior hx of Left PLIC infarct.  Chronic immobility, cognitive deficits due to CVA and need for 24/7 care.  Will order HHPT, OT, Nsg, Aide.  Lives in [place], referral to [facility].  Would benefit from hospital bed Needs PCP , cousin called office number for Volney Presser FNP but was told provider was no longer at practice , have made referral to Precious Haws MD who is in [facility] system Do not feel pt need PMR f/u at this time .

## 2020-10-05 ENCOUNTER — Telehealth: Payer: Self-pay

## 2020-10-05 NOTE — Telephone Encounter (Signed)
Received documentation from [employer] that Pt has requested a cancellation of monitoring service.   Order Cancelled.[name] KitchenMarland Brown

## 2021-03-12 IMAGING — CR DG CHEST 2V
2 series · 2 of 2 positions shown · non-contrast
Comparison: 04/17/2020

CLINICAL DATA: Wheezing

EXAM:
CHEST - 2 VIEW

[chest ap]
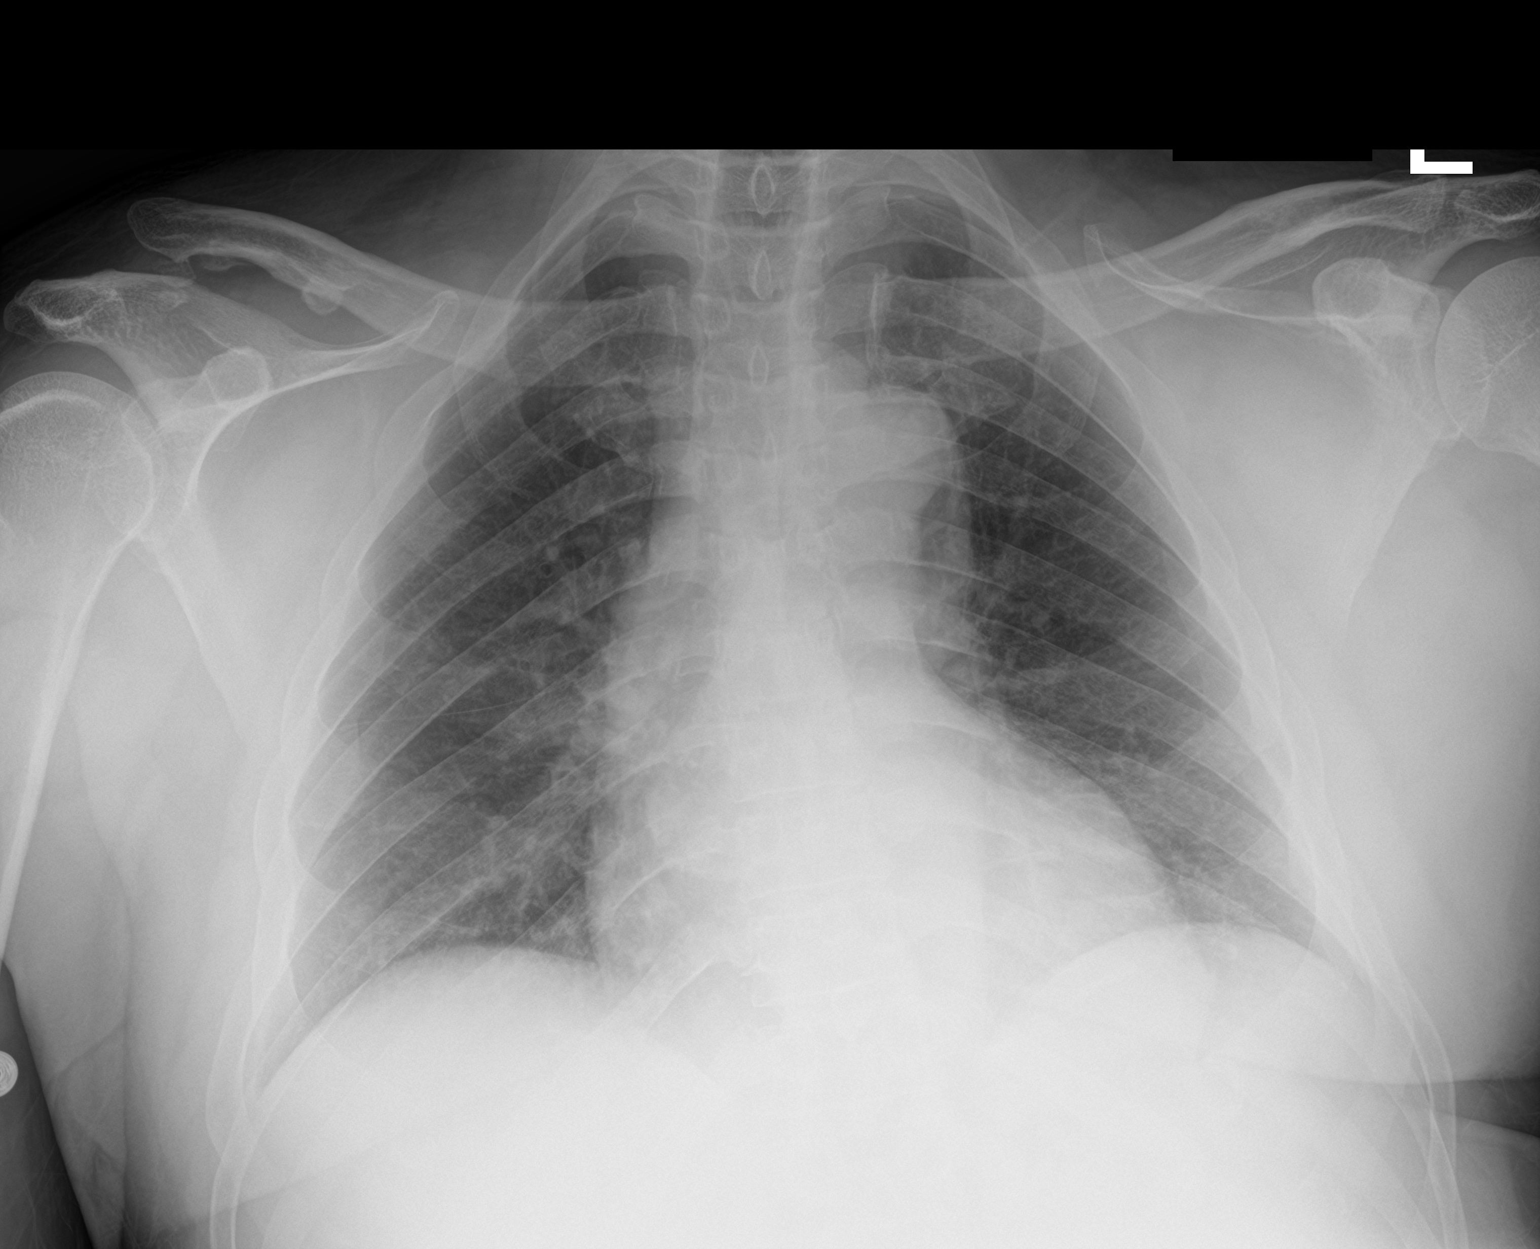

[chest lat]
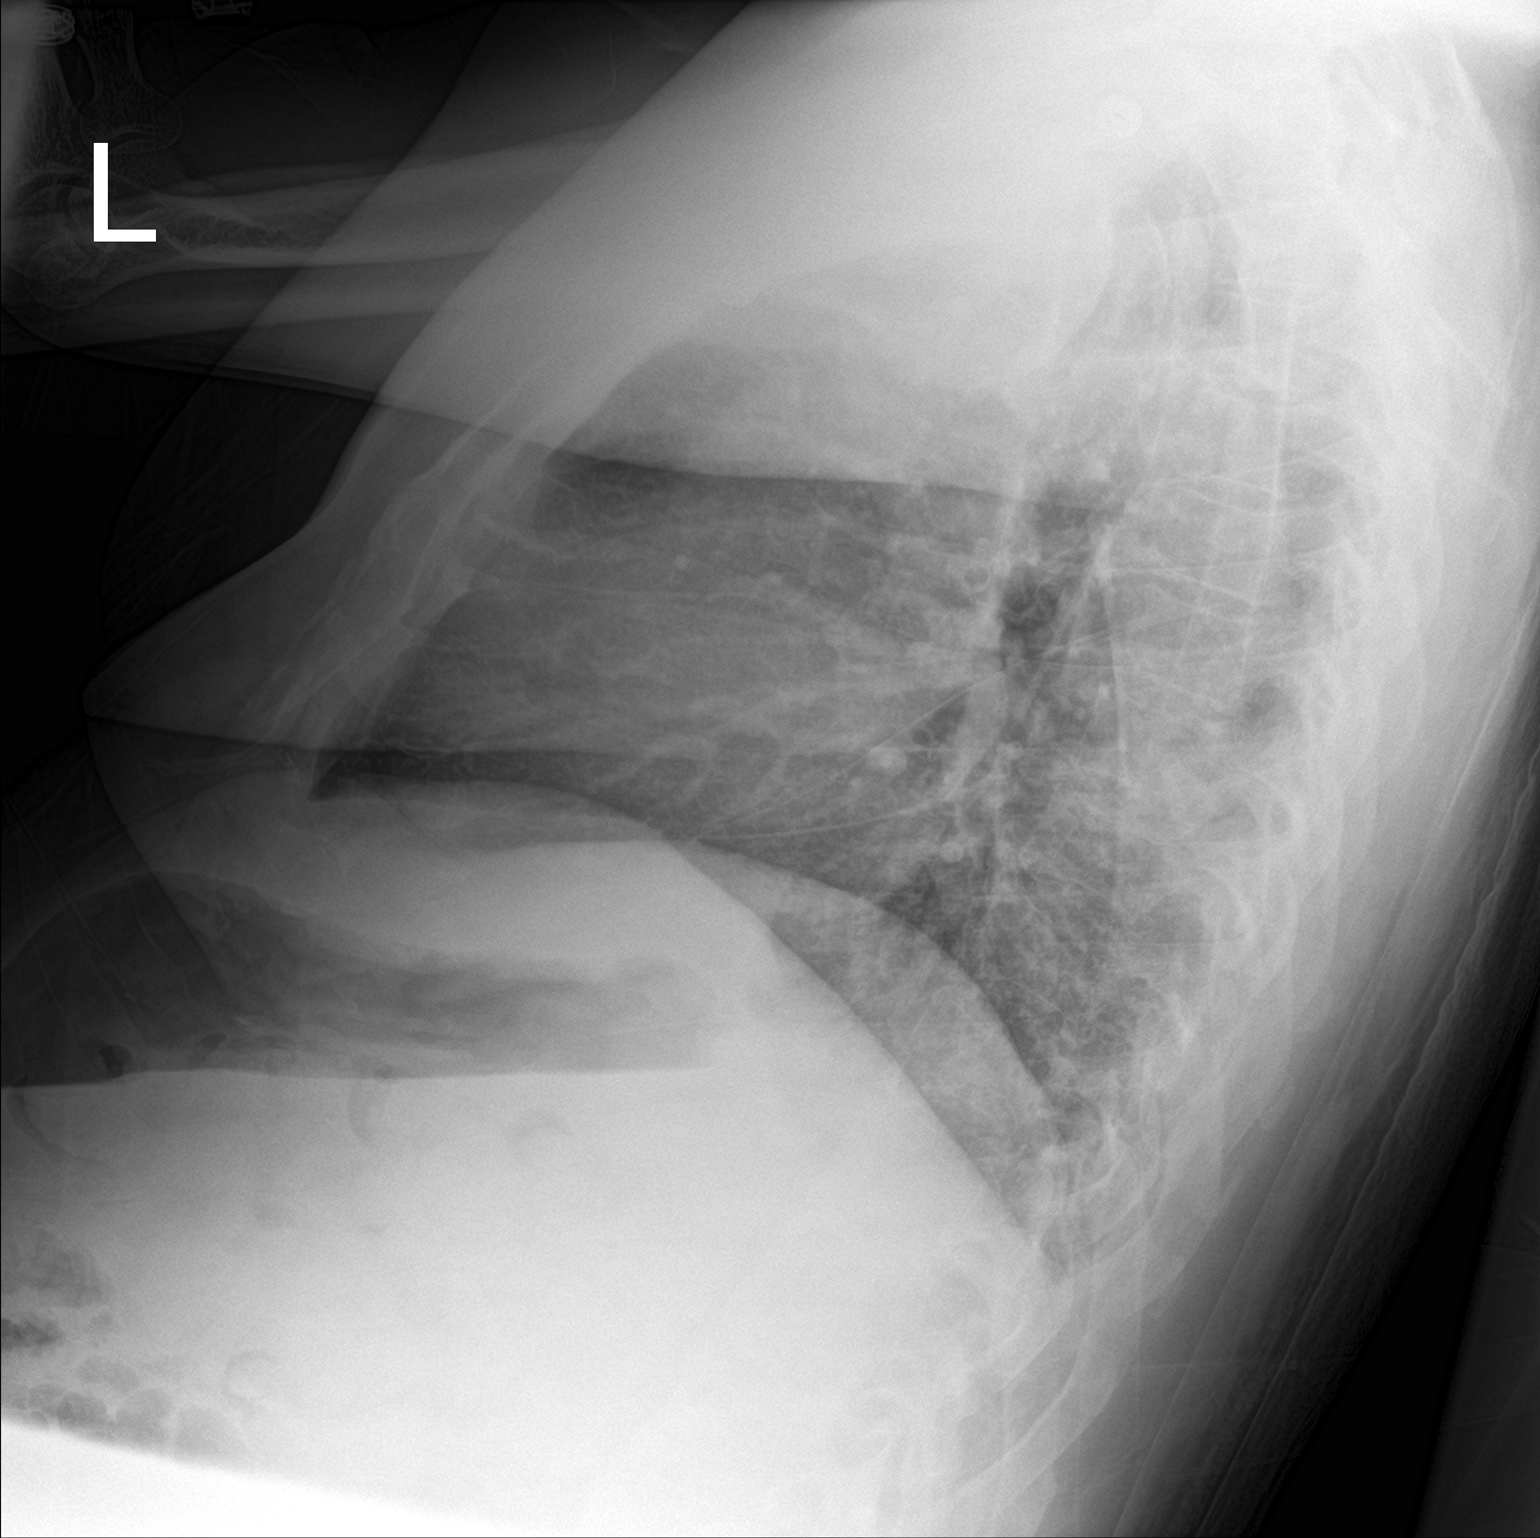

[2 of 2 positions shown; findings below may reference images not displayed]

FINDINGS: The heart size and mediastinal contours are within normal limits.
Both lungs are clear. Chronic deformity at the right AC joint.
IMPRESSION: No active cardiopulmonary disease.

## 2021-05-12 ENCOUNTER — Emergency Department (HOSPITAL_COMMUNITY): Payer: Medicaid Other

## 2021-05-12 ENCOUNTER — Other Ambulatory Visit: Payer: Self-pay

## 2021-05-12 ENCOUNTER — Observation Stay (HOSPITAL_COMMUNITY)
Admission: EM | Admit: 2021-05-12 | Discharge: 2021-05-14 | Disposition: A | Discharge status: Home / Self Care | Payer: Medicaid Other | Attending: Emergency Medicine | Admitting: Emergency Medicine

## 2021-05-12 DIAGNOSIS — K5900 Constipation, unspecified: Secondary | ICD-10-CM | POA: Insufficient documentation

## 2021-05-12 DIAGNOSIS — S60512A Abrasion of left hand, initial encounter: Principal | ICD-10-CM | POA: Insufficient documentation

## 2021-05-12 DIAGNOSIS — I10 Essential (primary) hypertension: Secondary | ICD-10-CM | POA: Diagnosis present

## 2021-05-12 DIAGNOSIS — Z79899 Other long term (current) drug therapy: Secondary | ICD-10-CM | POA: Insufficient documentation

## 2021-05-12 DIAGNOSIS — R112 Nausea with vomiting, unspecified: Secondary | ICD-10-CM | POA: Insufficient documentation

## 2021-05-12 DIAGNOSIS — Z7984 Long term (current) use of oral hypoglycemic drugs: Secondary | ICD-10-CM | POA: Insufficient documentation

## 2021-05-12 DIAGNOSIS — L989 Disorder of the skin and subcutaneous tissue, unspecified: Secondary | ICD-10-CM

## 2021-05-12 DIAGNOSIS — F17210 Nicotine dependence, cigarettes, uncomplicated: Secondary | ICD-10-CM | POA: Diagnosis not present

## 2021-05-12 DIAGNOSIS — E119 Type 2 diabetes mellitus without complications: Secondary | ICD-10-CM | POA: Diagnosis not present

## 2021-05-12 DIAGNOSIS — I6930 Unspecified sequelae of cerebral infarction: Secondary | ICD-10-CM

## 2021-05-12 DIAGNOSIS — I110 Hypertensive heart disease with heart failure: Secondary | ICD-10-CM | POA: Insufficient documentation

## 2021-05-12 DIAGNOSIS — Z20822 Contact with and (suspected) exposure to covid-19: Secondary | ICD-10-CM | POA: Insufficient documentation

## 2021-05-12 DIAGNOSIS — I5042 Chronic combined systolic (congestive) and diastolic (congestive) heart failure: Secondary | ICD-10-CM | POA: Insufficient documentation

## 2021-05-12 DIAGNOSIS — Z7982 Long term (current) use of aspirin: Secondary | ICD-10-CM | POA: Diagnosis not present

## 2021-05-12 DIAGNOSIS — X58XXXA Exposure to other specified factors, initial encounter: Secondary | ICD-10-CM | POA: Diagnosis not present

## 2021-05-12 DIAGNOSIS — R519 Headache, unspecified: Secondary | ICD-10-CM | POA: Diagnosis not present

## 2021-05-12 DIAGNOSIS — S6992XA Unspecified injury of left wrist, hand and finger(s), initial encounter: Secondary | ICD-10-CM | POA: Diagnosis present

## 2021-05-12 DIAGNOSIS — K920 Hematemesis: Secondary | ICD-10-CM | POA: Diagnosis present

## 2021-05-12 LAB — COMPREHENSIVE METABOLIC PANEL
ALT: 8 U/L (ref 0–44)
AST: 18 U/L (ref 15–41)
Albumin: 4.8 g/dL (ref 3.5–5.0)
Alkaline Phosphatase: 89 U/L (ref 38–126)
Anion gap: 15 (ref 5–15)
BUN: 33 mg/dL — ABNORMAL HIGH (ref 6–20)
CO2: 26 mmol/L (ref 22–32)
Calcium: 9.6 mg/dL (ref 8.9–10.3)
Chloride: 97 mmol/L — ABNORMAL LOW (ref 98–111)
Creatinine, Ser: 1.39 mg/dL — ABNORMAL HIGH (ref 0.61–1.24)
GFR, Estimated: 59 mL/min — ABNORMAL LOW (ref >60)
Glucose, Bld: 108 mg/dL — ABNORMAL HIGH (ref 70–99)
Potassium: 3.4 mmol/L — ABNORMAL LOW (ref 3.5–5.1)
Sodium: 138 mmol/L (ref 135–145)
Total Bilirubin: 1.1 mg/dL (ref 0.3–1.2)
Total Protein: 8.8 g/dL — ABNORMAL HIGH (ref 6.5–8.1)

## 2021-05-12 LAB — CBC WITH DIFFERENTIAL/PLATELET
Abs Immature Granulocytes: 0.03 10*3/uL (ref 0.00–0.07)
Basophils Absolute: 0 10*3/uL (ref 0.0–0.1)
Basophils Relative: 0 %
Eosinophils Absolute: 0.2 10*3/uL (ref 0.0–0.5)
Eosinophils Relative: 2 %
HCT: 42.5 % (ref 39.0–52.0)
Hemoglobin: 13.9 g/dL (ref 13.0–17.0)
Immature Granulocytes: 0 %
Lymphocytes Relative: 21 %
Lymphs Abs: 1.8 10*3/uL (ref 0.7–4.0)
MCH: 27.6 pg (ref 26.0–34.0)
MCHC: 32.7 g/dL (ref 30.0–36.0)
MCV: 84.5 fL (ref 80.0–100.0)
Monocytes Absolute: 0.7 10*3/uL (ref 0.1–1.0)
Monocytes Relative: 8 %
Neutro Abs: 5.8 10*3/uL (ref 1.7–7.7)
Neutrophils Relative %: 69 %
Platelets: 219 10*3/uL (ref 150–400)
RBC: 5.03 MIL/uL (ref 4.22–5.81)
RDW: 13.4 % (ref 11.5–15.5)
WBC: 8.5 10*3/uL (ref 4.0–10.5)
nRBC: 0 % (ref 0.0–0.2)

## 2021-05-12 LAB — URINALYSIS, ROUTINE W REFLEX MICROSCOPIC
Bilirubin Urine: NEGATIVE
Glucose, UA: NEGATIVE mg/dL
Hgb urine dipstick: NEGATIVE
Ketones, ur: 5 mg/dL — AB
Leukocytes,Ua: NEGATIVE
Nitrite: NEGATIVE
Protein, ur: NEGATIVE mg/dL
Specific Gravity, Urine: >1.046 — ABNORMAL HIGH (ref 1.005–1.030)
pH: 5 (ref 5.0–8.0)

## 2021-05-12 LAB — LIPASE, BLOOD: Lipase: 46 U/L (ref 11–51)

## 2021-05-12 MED ORDER — ONDANSETRON HCL 4 MG/2ML IJ SOLN
4.0000 mg | Freq: Once | INTRAMUSCULAR | Status: AC
  Administered 2021-05-12: 4 mg via INTRAVENOUS
  Filled 2021-05-12: qty 2

## 2021-05-12 MED ORDER — BACITRACIN ZINC 500 UNIT/GM EX OINT: 1.0000 "application " | TOPICAL_OINTMENT | Freq: Two times a day (BID) | CUTANEOUS | 0 refills | Status: DC

## 2021-05-12 MED ORDER — IOHEXOL 300 MG/ML  SOLN
100.0000 mL | Freq: Once | INTRAMUSCULAR | Status: AC | PRN
  Administered 2021-05-12: 100 mL via INTRAVENOUS

## 2021-05-12 MED ORDER — SODIUM CHLORIDE 0.9 % IV BOLUS
1000.0000 mL | Freq: Once | INTRAVENOUS | Status: AC
  Administered 2021-05-12: 1000 mL via INTRAVENOUS

## 2021-05-12 MED ORDER — LIDOCAINE HCL URETHRAL/MUCOSAL 2 % EX GEL
1.0000 "application " | Freq: Once | CUTANEOUS | Status: AC
  Administered 2021-05-12: 1 via TOPICAL
  Filled 2021-05-12: qty 11

## 2021-05-12 NOTE — ED Notes (Signed)
PA at the bedside for rectal exam

## 2021-05-12 NOTE — ED Triage Notes (Signed)
Patient BIB [facility] from [place] c/o constipation x1 week.  Facility has given everything they can to get him to have a BM.  Patient did have a KUB which showed no obstruction. Patient now having N/V x2 days.  Hx: of 4 CVAs with right sided weakness.  Patient did have a large episode of emesis on the way.  Patient's abdominal is distended.   Vitals were 150/100 80-HR 18-RR 98% room air 102-CBG 97.3-temp

## 2021-05-12 NOTE — ED Notes (Signed)
Patient transported to CT 

## 2021-05-12 NOTE — ED Notes (Signed)
Patient back from CT.

## 2021-05-12 NOTE — ED Notes (Signed)
[facility] called to transfer patient to [place]

## 2021-05-12 NOTE — ED Notes (Addendum)
Head of patients bed was lowered at his request. His call bell is in his hand and he said he was comfortable at this time.

## 2021-05-12 NOTE — ED Notes (Signed)
Patient was given a sandwich and water.

## 2021-05-12 NOTE — ED Notes (Signed)
Patient was given applesauce.

## 2021-05-12 NOTE — ED Provider Notes (Addendum)
[facility] DEPT Provider Note   CSN: [id number] Arrival date & time: 05/12/21  1333     History Chief Complaint  Patient presents with   Constipation    [name] is a 60 y.o. male.  HPI  Patient is a 60 year old male with a history of alcohol abuse, cocaine abuse, depression, diabetes, delirium tremens, gout, noncompliance with medication, schizophrenia, CVA, heart failure, uncontrolled hypertension, who presents to the emergency department today for evaluation of nausea and vomiting.  Patient is a limited historian but does c/o lower abd discomfort and nv. most of the history is obtained per EMS report.  He is coming from [facility] and they reportedly had not documented a bowel movement on the patient in about a week.  They gave him stool softeners and other medications but he has not had any output.  He has been having nausea and vomiting for the last 2 days.  Past Medical History:  Diagnosis Date   Alcohol abuse    Arthritis    Crack cocaine use    Depression    Diabetes mellitus    Drug abuse ([place])    DTs (delirium tremens) ([place])    history of    Gout    Noncompliance with medication regimen    Schizophrenia ([place])    Stroke ([place])    Systolic heart failure secondary to hypertension ([place])    Uncontrolled hypertension     Patient Active Problem List   Diagnosis Date Noted   Benign essential HTN    Chronic diastolic congestive heart failure ([place])    Dysphagia, post-stroke    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[place]) 04/20/2020   CVA (cerebral vascular accident) ([place]) 04/18/2020   Right sided weakness 04/17/2020   History of CVA with residual deficit 04/17/2020   Chronic combined systolic (congestive) and diastolic (congestive) heart failure ([place]) 04/17/2020   Substance use disorder 04/17/2020   Abnormal EKG 02/07/2020   Left hip pain 02/07/2020   AKI (acute kidney injury) ([place]) [id number]   Systolic heart  failure secondary to hypertension ([place])    Schizophrenia ([place])    Acute systolic heart failure ([place]) 02/02/2018   Chest pain 01/31/2018   Hypokalemia    Schizoaffective disorder, bipolar type ([place]) 11/22/2017   Trauma    Tachycardia 03/03/2017   Alcohol abuse    Cocaine abuse ([place])    Hypertensive urgency    Tachycardia    Cocaine abuse with cocaine-induced mood disorder ([place]) 12/05/2016   Cerebrovascular disease 09/02/2015   Neurocognitive disorder, unspecified secondary to cerebrovascular disease 09/02/2015   Malignant hypertension 08/30/2015   Alcohol use disorder, severe, dependence ([place]) 08/30/2015   Alcohol withdrawal ([place]) 08/30/2015   Alcohol abuse with alcohol-induced mood disorder ([place]) 08/30/2015   Thrombocytopenia ([place]) 08/27/2015   Tobacco use disorder 08/26/2015   Hyperlipidemia associated with type 2 diabetes mellitus ([place]) 04/28/2015   Schizoaffective disorder, depressive type ([place])    Hypertension associated with diabetes ([place]) 08/09/2013   Gout 08/09/2013   Diabetes mellitus ([place]) 07/09/2012    Past Surgical History:  Procedure Laterality Date   LEFT HEART CATH AND CORONARY ANGIOGRAPHY N/A 02/03/2018   Procedure: LEFT HEART CATH AND CORONARY ANGIOGRAPHY;  Surgeon: Jettie Booze, MD;  Location: [facility];  Service: Cardiovascular;  Laterality: N/A;   MANDIBLE RECONSTRUCTION     TONSILLECTOMY         Family History  Problem Relation Age of Onset   Hypertension Other     Social  History   Tobacco Use   Smoking status: Every Day    Packs/day: 0.25    Pack years: 0.00    Types: Cigarettes   Smokeless tobacco: Never  Vaping Use   Vaping Use: Former  Substance Use Topics   Alcohol use: Not Currently    Alcohol/week: 1.0 - 2.0 standard drink    Types: 1 - 2 Cans of beer per week    Comment: 2 cases of beer a day + liquour   Drug use: Not Currently    Types: "Crack" cocaine, Cocaine    Home Medications Prior to Admission  medications   Medication Sig Start Date End Date Taking? Authorizing Provider  bacitracin ointment Apply 1 application topically 2 (two) times daily. 05/12/21  Yes Albi Rappaport S, PA-C  acetaminophen (TYLENOL) 325 MG tablet Take 2 tablets (650 mg total) by mouth every 6 (six) hours as needed for mild pain (or Fever >/= 101). 02/10/20   Sheikh, Omair Latif, DO  amLODipine (NORVASC) 10 MG tablet Take 1 tablet (10 mg total) by mouth daily. 05/16/20   Angiulli, Lavon Paganini, PA-C  aspirin EC 325 MG EC tablet Take 1 tablet (325 mg total) by mouth daily. 05/16/20   Angiulli, Lavon Paganini, PA-C  atorvastatin (LIPITOR) 40 MG tablet Take 1 tablet (40 mg total) by mouth daily. 05/15/20   Angiulli, Lavon Paganini, PA-C  carvedilol (COREG) 25 MG tablet Take 1 tablet (25 mg total) by mouth 2 (two) times daily with a meal. 05/15/20   Angiulli, Lavon Paganini, PA-C  clopidogrel (PLAVIX) 75 MG tablet Take 1 tablet (75 mg total) by mouth daily. 05/15/20   Angiulli, Lavon Paganini, PA-C  cyclobenzaprine (FLEXERIL) 5 MG tablet Take 1 tablet (5 mg total) by mouth 3 (three) times daily as needed for muscle spasms. 05/15/20   Angiulli, Lavon Paganini, PA-C  diclofenac Sodium (VOLTAREN) 1 % GEL Apply 2 g topically 4 (four) times daily. 05/15/20   Angiulli, Lavon Paganini, PA-C  ergocalciferol (VITAMIN D2) 1.25 MG (50000 UT) capsule Take 1 capsule (50,000 Units total) by mouth once a week. 05/15/20   Angiulli, Lavon Paganini, PA-C  FLUoxetine (PROZAC) 10 MG capsule Take 1 capsule (10 mg total) by mouth daily. 05/15/20   Angiulli, Lavon Paganini, PA-C  folic acid (FOLVITE) 1 MG tablet Take 1 tablet (1 mg total) by mouth daily. 05/15/20   Angiulli, Lavon Paganini, PA-C  gabapentin (NEURONTIN) 300 MG capsule Take 2 capsules (600 mg total) by mouth 3 (three) times daily. 05/15/20   Angiulli, Lavon Paganini, PA-C  hydrALAZINE (APRESOLINE) 25 MG tablet Take 1 tablet (25 mg total) by mouth every 8 (eight) hours. 05/15/20   Angiulli, Lavon Paganini, PA-C  hydrOXYzine (ATARAX/VISTARIL) 25 MG tablet Take 1  tablet (25 mg total) by mouth 2 (two) times daily. 05/15/20   Angiulli, Lavon Paganini, PA-C  losartan (COZAAR) 100 MG tablet Take 1 tablet (100 mg total) by mouth daily. 05/16/20   Angiulli, Lavon Paganini, PA-C  metFORMIN (GLUCOPHAGE) 500 MG tablet Take 1 tablet (500 mg total) by mouth 2 (two) times daily with a meal. 05/15/20   Angiulli, Lavon Paganini, PA-C  mirtazapine (REMERON) 7.5 MG tablet Take 1 tablet (7.5 mg total) by mouth at bedtime. 05/15/20   Angiulli, Lavon Paganini, PA-C  Multiple Vitamin (DAILY-VITE) TABS Take 1 tablet by mouth daily. 03/03/20   [provider]  paliperidone (INVEGA) 3 MG 24 hr tablet Take 1 tablet (3 mg total) by mouth daily. 05/15/20   Angiulli, Lavon Paganini, PA-C  pantoprazole (PROTONIX) 40 MG tablet Take 1 tablet (40 mg total) by mouth daily. 05/16/20   Angiulli, Lavon Paganini, PA-C  SENNA PLUS 8.6-50 MG tablet Take 1 tablet by mouth daily. 03/03/20   [provider]    Allergies    Patient has no known allergies.  Review of Systems   Review of Systems  Gastrointestinal:  Positive for abdominal pain (lower abd pain), nausea and vomiting.   Physical Exam Updated Vital Signs BP (!) 155/101   Pulse 72   Temp (!) 97.5 F (36.4 C) (Oral)   Resp 16   SpO2 100%   Physical Exam Vitals and nursing note reviewed.  Constitutional:      Appearance: He is well-developed.  HENT:     Head: Normocephalic and atraumatic.  Eyes:     Conjunctiva/sclera: Conjunctivae normal.  Cardiovascular:     Rate and Rhythm: Normal rate and regular rhythm.     Heart sounds: Normal heart sounds. No murmur heard. Pulmonary:     Effort: Pulmonary effort is normal. No respiratory distress.     Breath sounds: Normal breath sounds. No wheezing, rhonchi or rales.  Abdominal:     Palpations: Abdomen is soft.     Tenderness: There is no abdominal tenderness. There is no guarding or rebound.     Comments: Decreased bs  Genitourinary:    Comments: Chaperone present. Dre performed. No hard stool or  impaction noted. Light brown stool in the rectal vault. Musculoskeletal:     Cervical back: Neck supple.  Skin:    General: Skin is warm and dry.     Comments: Several 1cm blisters noted to the left hand with 1cm superficial abrasion. Some swelling noted. No warmth or induration noted. (Pt unable to tell me cause of wounds)  Neurological:     Mental Status: He is alert.    ED Results / Procedures / Treatments   Labs (all labs ordered are listed, but only abnormal results are displayed) Labs Reviewed  COMPREHENSIVE METABOLIC PANEL - Abnormal; Notable for the following components:      Result Value   Potassium 3.4 (*)    Chloride 97 (*)    Glucose, Bld 108 (*)    BUN 33 (*)    Creatinine, Ser 1.39 (*)    Total Protein 8.8 (*)    GFR, Estimated 59 (*)    All other components within normal limits  URINALYSIS, ROUTINE W REFLEX MICROSCOPIC - Abnormal; Notable for the following components:   Specific Gravity, Urine >1.046 (*)    Ketones, ur 5 (*)    All other components within normal limits  CBC WITH DIFFERENTIAL/PLATELET  LIPASE, BLOOD    EKG None  Radiology DG Chest 2 View  Result Date: 05/12/2021 CLINICAL DATA:  Vomiting.  Rule out aspiration. EXAM: CHEST - 2 VIEW COMPARISON:  Chest x-ray 05/09/2020 CT abdomen pelvis 05/12/2021, chest x-ray 05/09/2020 FINDINGS: The heart size and mediastinal contours are within normal limits. No focal consolidation. No pulmonary edema. No pleural effusion. No pneumothorax. No acute osseous abnormality. Degenerative changes of the right shoulder with widening of the acromioclavicular joint. Query trace hiatal hernia. IMPRESSION: 1. No active cardiopulmonary disease. 2. Query trace hiatal hernia. Electronically Signed   By: Iven Finn M.D.   On: 05/12/2021 20:50   CT Head Wo Contrast  Result Date: 05/12/2021 CLINICAL DATA:  Headache.  History of strokes. EXAM: CT HEAD WITHOUT CONTRAST TECHNIQUE: Contiguous axial images were obtained from the  base of the skull  through the vertex without intravenous contrast. COMPARISON:  05/12/2020 FINDINGS: Brain: There is no evidence of an acute infarct, intracranial hemorrhage, mass, midline shift, or extra-axial fluid collection. Patchy hypodensities in the cerebral white matter bilaterally have not significantly changed and are nonspecific but compatible with moderately age advanced chronic small vessel ischemic disease. A chronic lacunar infarct is again noted in the left basal ganglia. There is chronic dystrophic calcification medially in the left cerebellar hemisphere. New hypoattenuation lateral to the calcification in the left cerebellar hemisphere extending into the middle cerebellar peduncle is compatible with encephalomalacia/an interval chronic infarct. A small focus of cortical encephalomalacia laterally in the left temporal lobe is unchanged. There is mild cerebral and cerebellar atrophy. Vascular: Calcified atherosclerosis at the skull base. Skull: No fracture or suspicious osseous lesion. Sinuses/Orbits: Visualized paranasal sinuses and mastoid air cells are clear. Unremarkable orbits. Other: None. IMPRESSION: 1. No evidence of acute intracranial abnormality. 2. Age advanced chronic small vessel ischemia with multiple chronic infarcts as above including an interval left cerebellar infarct. Electronically Signed   By: Logan Bores M.D.   On: 05/12/2021 20:13   CT ABDOMEN PELVIS W CONTRAST  Result Date: 05/12/2021 CLINICAL DATA:  Constipation for 1 week. Concern for bowel obstruction. EXAM: CT ABDOMEN AND PELVIS WITH CONTRAST TECHNIQUE: Multidetector CT imaging of the abdomen and pelvis was performed using the standard protocol following bolus administration of intravenous contrast. CONTRAST:  121m OMNIPAQUE IOHEXOL 300 MG/ML  SOLN COMPARISON:  None. FINDINGS: Lower chest: No acute findings. Hepatobiliary: No focal liver abnormality is seen. No gallstones, gallbladder wall thickening, or biliary  dilatation. Pancreas: Unremarkable. No pancreatic ductal dilatation or surrounding inflammatory changes. Spleen: Normal in size without focal abnormality. Adrenals/Urinary Tract: No adrenal masses. Kidneys normal in size, orientation and position with symmetric enhancement and excretion. No renal mass, stone or hydronephrosis. Normal ureters. Normal bladder. Stomach/Bowel: Normal stomach. Small bowel and colon are normal in caliber. No wall thickening. No inflammation. Mild to moderate increase in the colonic stool burden. Normal appendix visualized. Vascular/Lymphatic: Mild aortic atherosclerosis. No aneurysm. No enlarged lymph nodes. Reproductive: Unremarkable. Other: No abdominal wall hernia or abnormality. No abdominopelvic ascites. Musculoskeletal: No acute or significant osseous findings. IMPRESSION: 1. No acute findings. No evidence of bowel obstruction or inflammation. 2. Mild to moderate increase in the colonic stool burden. 3. Mild aortic atherosclerosis. Electronically Signed   By: DLajean ManesM.D.   On: 05/12/2021 16:05    Procedures Procedures   Medications Ordered in ED Medications  sodium chloride 0.9 % bolus 1,000 mL (0 mLs Intravenous Stopped 05/12/21 1831)  ondansetron (ZOFRAN) injection 4 mg (4 mg Intravenous Given 05/12/21 1423)  iohexol (OMNIPAQUE) 300 MG/ML solution 100 mL (100 mLs Intravenous Contrast Given 05/12/21 1534)  lidocaine (XYLOCAINE) 2 % jelly 1 application (1 application Topical Given 05/12/21 1715)    ED Course  I have reviewed the triage vital signs and the nursing notes.  Pertinent labs & imaging results that were available during my care of the patient were reviewed by me and considered in my medical decision making (see chart for details).    MDM Rules/Calculators/A&P                          60y/o m here with c/o constipation, lower abd discomfort and nv.  Reviewed/interpreted labs CBC w/o leukocytosis or anemia CMP with mild hypokalemia, mildly  elevated cr but stable from prior, normal lfts and bili Lipase neg  UA  neg for uti  Reviewed/interpreted imaging CT abd/pelvis - showed constipation CT head added due to persistent vomiting and concern for possible ich - No evidence of acute intracranial abnormality. 2. Age advanced chronic small vessel ischemia with multiple chronic infarcts as above including an interval left cerebellar infarct.  CXR added due to concern for aspiration with frequent vomiting -does not show any evidence of pneumonia or other cardiopulmonary abnormality  Patient was provided with IV fluids here in the ED, he was also given antiemetics and since then has been able to tolerate water and also applesauce.  He has had no further episodes of vomiting while in the emergency department for the last 7 and half hours.  His work-up is reassuring.  I feel he is appropriate for discharge home with continuation of his bowel regimen and Zofran as needed.  I will also give rx for bacitracin for home for the wounds on his hand. Hand does not appear cellulitic at this time however will need close monitoring. Advised PCP follow-up and strict return precautions.    Final Clinical Impression(s) / ED Diagnoses Final diagnoses:  Non-intractable vomiting with nausea, unspecified vomiting type  Skin lesion of hand    Rx / DC Orders ED Discharge Orders          Ordered    bacitracin ointment  2 times daily        05/12/21 2106             Rodney Booze, PA-C 05/12/21 2057    Rodney Booze, PA-C 05/12/21 2108    Orpah Greek, MD 05/13/21 0330

## 2021-05-12 NOTE — Discharge Instructions (Addendum)
Patient had a CT scan that did not show any acute abnormalities other than constipation.  He did not have any evidence of fecal impaction.  He was given nausea medicines and was able to tolerate p.o. in the emergency department.    He was noted to have some blistering to the left hand on arrival.  He is unable to tell me what happened to cause this injury.  It is not appear grossly infected on my evaluation but I do recommend bacitracin ointment on the wound twice daily for the next week.  The remainder of his work-up was reassuring.  Feel he is appropriate for discharge with follow-up with his regular doctor.  He can be continued on MiraLAX and Zofran as needed.  He should return to the ED for new or worsening symptoms.

## 2021-05-13 ENCOUNTER — Encounter (HOSPITAL_COMMUNITY): Payer: Self-pay | Admitting: Internal Medicine

## 2021-05-13 DIAGNOSIS — K920 Hematemesis: Secondary | ICD-10-CM | POA: Diagnosis not present

## 2021-05-13 DIAGNOSIS — R112 Nausea with vomiting, unspecified: Secondary | ICD-10-CM

## 2021-05-13 DIAGNOSIS — E1159 Type 2 diabetes mellitus with other circulatory complications: Secondary | ICD-10-CM | POA: Diagnosis not present

## 2021-05-13 DIAGNOSIS — I5042 Chronic combined systolic (congestive) and diastolic (congestive) heart failure: Secondary | ICD-10-CM | POA: Diagnosis not present

## 2021-05-13 LAB — GLUCOSE, CAPILLARY
Glucose-Capillary: 106 mg/dL — ABNORMAL HIGH (ref 70–99)
Glucose-Capillary: 96 mg/dL (ref 70–99)
Glucose-Capillary: 121 mg/dL — ABNORMAL HIGH (ref 70–99)
Glucose-Capillary: 107 mg/dL — ABNORMAL HIGH (ref 70–99)

## 2021-05-13 LAB — CBC
HCT: 42.2 % (ref 39.0–52.0)
HCT: 41.3 % (ref 39.0–52.0)
Hemoglobin: 13.3 g/dL (ref 13.0–17.0)
Hemoglobin: 13.2 g/dL (ref 13.0–17.0)
MCH: 27.3 pg (ref 26.0–34.0)
MCH: 27.3 pg (ref 26.0–34.0)
MCHC: 31.5 g/dL (ref 30.0–36.0)
MCHC: 32 g/dL (ref 30.0–36.0)
MCV: 86.5 fL (ref 80.0–100.0)
MCV: 85.3 fL (ref 80.0–100.0)
Platelets: 222 10*3/uL (ref 150–400)
Platelets: 212 10*3/uL (ref 150–400)
RBC: 4.88 MIL/uL (ref 4.22–5.81)
RBC: 4.84 MIL/uL (ref 4.22–5.81)
RDW: 13.5 % (ref 11.5–15.5)
RDW: 13.3 % (ref 11.5–15.5)
WBC: 9.6 10*3/uL (ref 4.0–10.5)
WBC: 9.4 10*3/uL (ref 4.0–10.5)
nRBC: 0 % (ref 0.0–0.2)
nRBC: 0 % (ref 0.0–0.2)

## 2021-05-13 LAB — BASIC METABOLIC PANEL
Anion gap: 12 (ref 5–15)
BUN: 28 mg/dL — ABNORMAL HIGH (ref 6–20)
CO2: 29 mmol/L (ref 22–32)
Calcium: 9.5 mg/dL (ref 8.9–10.3)
Chloride: 98 mmol/L (ref 98–111)
Creatinine, Ser: 1.27 mg/dL — ABNORMAL HIGH (ref 0.61–1.24)
GFR, Estimated: >60 mL/min (ref >60)
Glucose, Bld: 105 mg/dL — ABNORMAL HIGH (ref 70–99)
Potassium: 3 mmol/L — ABNORMAL LOW (ref 3.5–5.1)
Sodium: 139 mmol/L (ref 135–145)

## 2021-05-13 LAB — OCCULT BLOOD GASTRIC / DUODENUM (SPECIMEN CUP): Occult Blood, Gastric: POSITIVE — AB

## 2021-05-13 LAB — CBG MONITORING, ED: Glucose-Capillary: 106 mg/dL — ABNORMAL HIGH (ref 70–99)

## 2021-05-13 LAB — RESP PANEL BY RT-PCR (FLU A&B, COVID) ARPGX2
Influenza A by PCR: NEGATIVE
Influenza B by PCR: NEGATIVE
SARS Coronavirus 2 by RT PCR: NEGATIVE

## 2021-05-13 LAB — HIV ANTIBODY (ROUTINE TESTING W REFLEX): HIV Screen 4th Generation wRfx: NONREACTIVE

## 2021-05-13 MED ORDER — MIRTAZAPINE 15 MG PO TABS
7.5000 mg | ORAL_TABLET | Freq: Every day | ORAL | Status: DC
  Administered 2021-05-13: 7.5 mg via ORAL
  Filled 2021-05-13: qty 1

## 2021-05-13 MED ORDER — SENNOSIDES-DOCUSATE SODIUM 8.6-50 MG PO TABS
2.0000 | ORAL_TABLET | Freq: Two times a day (BID) | ORAL | Status: DC
  Administered 2021-05-13 – 2021-05-14 (×3): 2 via ORAL
  Filled 2021-05-13 (×4): qty 2

## 2021-05-13 MED ORDER — BACITRACIN ZINC 500 UNIT/GM EX OINT
TOPICAL_OINTMENT | Freq: Two times a day (BID) | CUTANEOUS | Status: DC
  Filled 2021-05-13: qty 28.35
  Filled 2021-05-13: qty 14

## 2021-05-13 MED ORDER — INSULIN ASPART 100 UNIT/ML IJ SOLN: 0.0000 [IU] | Freq: Three times a day (TID) | INTRAMUSCULAR | Status: DC

## 2021-05-13 MED ORDER — ACETAMINOPHEN 325 MG PO TABS: 650.0000 mg | ORAL_TABLET | Freq: Four times a day (QID) | ORAL | Status: DC | PRN

## 2021-05-13 MED ORDER — AMLODIPINE BESYLATE 10 MG PO TABS
10.0000 mg | ORAL_TABLET | Freq: Every day | ORAL | Status: DC
  Administered 2021-05-13 – 2021-05-14 (×2): 10 mg via ORAL
  Filled 2021-05-13 (×2): qty 1

## 2021-05-13 MED ORDER — PANTOPRAZOLE SODIUM 40 MG IV SOLR
40.0000 mg | Freq: Once | INTRAVENOUS | Status: AC
  Administered 2021-05-13: 40 mg via INTRAVENOUS
  Filled 2021-05-13: qty 40

## 2021-05-13 MED ORDER — ATORVASTATIN CALCIUM 40 MG PO TABS
40.0000 mg | ORAL_TABLET | Freq: Every day | ORAL | Status: DC
  Administered 2021-05-13 – 2021-05-14 (×2): 40 mg via ORAL
  Filled 2021-05-13 (×2): qty 1

## 2021-05-13 MED ORDER — PALIPERIDONE ER 3 MG PO TB24
3.0000 mg | ORAL_TABLET | Freq: Every day | ORAL | Status: DC
Start: 2021-05-13 — End: 2021-05-14
  Administered 2021-05-13 – 2021-05-14 (×2): 3 mg via ORAL
  Filled 2021-05-13 (×2): qty 1

## 2021-05-13 MED ORDER — ONDANSETRON HCL 4 MG/2ML IJ SOLN
4.0000 mg | Freq: Four times a day (QID) | INTRAMUSCULAR | Status: DC | PRN
Start: 2021-05-13 — End: 2021-05-14

## 2021-05-13 MED ORDER — LABETALOL HCL 5 MG/ML IV SOLN
5.0000 mg | INTRAVENOUS | Status: DC | PRN
  Administered 2021-05-14: 5 mg via INTRAVENOUS
  Filled 2021-05-13: qty 4

## 2021-05-13 MED ORDER — FLUOXETINE HCL 10 MG PO CAPS
10.0000 mg | ORAL_CAPSULE | Freq: Every day | ORAL | Status: DC
  Administered 2021-05-13 – 2021-05-14 (×2): 10 mg via ORAL
  Filled 2021-05-13 (×2): qty 1

## 2021-05-13 MED ORDER — PANTOPRAZOLE INFUSION (NEW) - SIMPLE MED
8.0000 mg/h | INTRAVENOUS | Status: DC
  Administered 2021-05-13 – 2021-05-14 (×4): 8 mg/h via INTRAVENOUS
  Filled 2021-05-13: qty 80
  Filled 2021-05-13: qty 100
  Filled 2021-05-13 (×3): qty 80
  Filled 2021-05-13: qty 100

## 2021-05-13 MED ORDER — CARVEDILOL 25 MG PO TABS
25.0000 mg | ORAL_TABLET | Freq: Two times a day (BID) | ORAL | Status: DC
  Administered 2021-05-13 – 2021-05-14 (×3): 25 mg via ORAL
  Filled 2021-05-13 (×3): qty 1

## 2021-05-13 MED ORDER — ONDANSETRON HCL 4 MG PO TABS: 4.0000 mg | ORAL_TABLET | Freq: Four times a day (QID) | ORAL | Status: DC | PRN

## 2021-05-13 MED ORDER — LACTATED RINGERS IV SOLN: INTRAVENOUS | Status: DC

## 2021-05-13 MED ORDER — ONDANSETRON 8 MG PO TBDP
8.0000 mg | ORAL_TABLET | Freq: Once | ORAL | Status: AC
  Administered 2021-05-13: 8 mg via ORAL
  Filled 2021-05-13: qty 1

## 2021-05-13 MED ORDER — INSULIN ASPART 100 UNIT/ML IJ SOLN
0.0000 [IU] | INTRAMUSCULAR | Status: DC
  Filled 2021-05-13: qty 0.09

## 2021-05-13 MED ORDER — POLYETHYLENE GLYCOL 3350 17 G PO PACK
17.0000 g | PACK | Freq: Every day | ORAL | Status: DC
  Administered 2021-05-13 – 2021-05-14 (×2): 17 g via ORAL
  Filled 2021-05-13 (×2): qty 1

## 2021-05-13 MED ORDER — ACETAMINOPHEN 650 MG RE SUPP: 650.0000 mg | Freq: Four times a day (QID) | RECTAL | Status: DC | PRN

## 2021-05-13 MED ORDER — BISACODYL 10 MG RE SUPP: 10.0000 mg | Freq: Every day | RECTAL | Status: DC | PRN

## 2021-05-13 NOTE — Plan of Care (Signed)
  Problem: Health Behavior/Discharge Planning: Goal: Ability to manage health-related needs will improve Outcome: Progressing   Problem: Nutrition: Goal: Adequate nutrition will be maintained Outcome: Progressing   Problem: Clinical Measurements: Goal: Diagnostic test results will improve Outcome: Progressing

## 2021-05-13 NOTE — TOC Initial Note (Signed)
Transition of Care [facility]) - Initial/Assessment Note    Patient Details  Name: [name] MRN: [id number] Date of Birth: 04-Sep-1961  [facility]) CM/SW Contact:    Lennart Pall, LCSW Phone Number: 05/13/2021, 10:30 AM  Clinical Narrative:                 Alerted by MD that pt will be a LTC SNF return once medically cleared by GI.  Pt confirms he is a [facility] resident at [facility] and is agreeable to return.  Have spoken with Deirdre Pippins, Admission liaison, who confirms plan for his return.  Anticipate possible dc next few days.  Expected Discharge Plan: [place] Barriers to Discharge: Continued Medical Work up   Patient Goals and CMS Choice Patient states their goals for this hospitalization and ongoing recovery are:: return to SNF      Expected Discharge Plan and Services Expected Discharge Plan: [place] Acute Care Choice: [place] Living arrangements for the past 2 months: [facility] SNF)                 DME Arranged: N/A DME Agency: NA                  Prior Living Arrangements/Services Living arrangements for the past 2 months: [facility] SNF) Lives with:: Facility Resident Patient language and need for interpreter reviewed:: Yes Do you feel safe going back to the place where you live?: Yes      Need for Family Participation in Patient Care: No (Comment) Care giver support system in place?: Yes (comment)   Criminal Activity/Legal Involvement Pertinent to Current Situation/Hospitalization: No - Comment as needed  Activities of Daily Living Home Assistive Devices/Equipment: [employer] fast streamer ADL Screening (condition at time of admission) Patient's cognitive ability adequate to safely complete daily activities?: No Is the patient deaf or have difficulty hearing?: No Does the patient have difficulty seeing, even when wearing  glasses/contacts?: No Does the patient have difficulty concentrating, remembering, or making decisions?: Yes Patient able to express need for assistance with ADLs?: Yes Does the patient have difficulty dressing or bathing?: Yes Independently performs ADLs?: No Communication: Needs assistance Is this a change from baseline?: Pre-admission baseline Dressing (OT): Needs assistance Grooming: Needs assistance Feeding: Needs assistance Bathing: Needs assistance Toileting: Needs assistance Is this a change from baseline?: Pre-admission baseline In/Out Bed: Dependent Walks in Home: Dependent Is this a change from baseline?: Pre-admission baseline Does the patient have difficulty walking or climbing stairs?: Yes Weakness of Legs: Both Weakness of Arms/Hands: Both  Permission Sought/Granted Permission sought to share information with : Facility Art therapist granted to share information with : Yes, Verbal Permission Granted              Emotional Assessment Appearance:: Appears older than stated age Attitude/Demeanor/Rapport: Gracious Affect (typically observed): Accepting Orientation: : Oriented to Self, Oriented to Place Alcohol / Substance Use: Not Applicable Psych Involvement: No (comment)  Admission diagnosis:  Hematemesis [K92.0] Skin lesion of hand [L98.9] Non-intractable vomiting with nausea, unspecified vomiting type [R11.2] Patient Active Problem List   Diagnosis Date Noted   Hematemesis 05/13/2021   Benign essential HTN    Chronic diastolic congestive heart failure ([place])    Dysphagia, post-stroke    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[place]) 04/20/2020   CVA (cerebral vascular accident) ([place]) 04/18/2020   Right sided weakness 04/17/2020   History of CVA with  residual deficit 04/17/2020   Chronic combined systolic (congestive) and diastolic (congestive) heart failure ([place]) 04/17/2020   Substance use disorder 04/17/2020   Abnormal EKG 02/07/2020    Left hip pain 02/07/2020   AKI (acute kidney injury) ([place]) [id number]   Systolic heart failure secondary to hypertension ([place])    Schizophrenia ([place])    Acute systolic heart failure ([place]) 02/02/2018   Chest pain 01/31/2018   Hypokalemia    Schizoaffective disorder, bipolar type ([place]) 11/22/2017   Trauma    Tachycardia 03/03/2017   Alcohol abuse    Cocaine abuse ([place])    Hypertensive urgency    Tachycardia    Cocaine abuse with cocaine-induced mood disorder ([place]) 12/05/2016   Cerebrovascular disease 09/02/2015   Neurocognitive disorder, unspecified secondary to cerebrovascular disease 09/02/2015   Malignant hypertension 08/30/2015   Alcohol use disorder, severe, dependence ([place]) 08/30/2015   Alcohol withdrawal ([place]) 08/30/2015   Alcohol abuse with alcohol-induced mood disorder ([place]) 08/30/2015   Thrombocytopenia ([place]) 08/27/2015   Tobacco use disorder 08/26/2015   Hyperlipidemia associated with type 2 diabetes mellitus () 04/28/2015   Schizoaffective disorder, depressive type ([place])    Hypertension associated with diabetes ([place]) 08/09/2013   Gout 08/09/2013   Diabetes mellitus ([place]) 07/09/2012   PCP:  Boyce Medici, FNP Pharmacy:   [id number] - [place], [place] - [place] [address] [place] [address] Phone: [phone] Fax: [facility] [phone] - [place], [place] - [address] AT [place] [address] [place] [id number] Phone: [phone] Fax: [phone]     Social Determinants of Health (SDOH) Interventions    Readmission Risk Interventions No flowsheet data found.

## 2021-05-13 NOTE — NC FL2 (Signed)
[place] LEVEL OF CARE SCREENING TOOL     IDENTIFICATION  Patient Name: [name] Birthdate: 05-09-1961 Sex: male Admission Date (Current Location): 05/12/2021  [place] and [place] Number:  Kathleen Argue [id number] [place] and Address:  [facility],  [place] [address] [place]      Provider Number: [id number]  Attending Physician Name and Address:  Caren Griffins, MD  Relative Name and Phone Number:       Current Level of Care: SNF Recommended Level of Care: [place] Prior Approval Number:    Date Approved/Denied:   PASRR Number: [id number]  Discharge Plan: SNF    Current Diagnoses: Patient Active Problem List   Diagnosis Date Noted   Hematemesis 05/13/2021   Benign essential HTN    Chronic diastolic congestive heart failure ([place])    Dysphagia, post-stroke    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[place]) 04/20/2020   CVA (cerebral vascular accident) ([place]) 04/18/2020   Right sided weakness 04/17/2020   History of CVA with residual deficit 04/17/2020   Chronic combined systolic (congestive) and diastolic (congestive) heart failure ([place]) 04/17/2020   Substance use disorder 04/17/2020   Abnormal EKG 02/07/2020   Left hip pain 02/07/2020   AKI (acute kidney injury) ([place]) [id number]   Systolic heart failure secondary to hypertension ([place])    Schizophrenia ([place])    Acute systolic heart failure ([place]) 02/02/2018   Chest pain 01/31/2018   Hypokalemia    Schizoaffective disorder, bipolar type ([place]) 11/22/2017   Trauma    Tachycardia 03/03/2017   Alcohol abuse    Cocaine abuse ([place])    Hypertensive urgency    Tachycardia    Cocaine abuse with cocaine-induced mood disorder ([place]) 12/05/2016   Cerebrovascular disease 09/02/2015   Neurocognitive disorder, unspecified secondary to cerebrovascular disease 09/02/2015   Malignant hypertension 08/30/2015   Alcohol use disorder, severe, dependence ([place]) 08/30/2015    Alcohol withdrawal ([place]) 08/30/2015   Alcohol abuse with alcohol-induced mood disorder ([place]) 08/30/2015   Thrombocytopenia ([place]) 08/27/2015   Tobacco use disorder 08/26/2015   Hyperlipidemia associated with type 2 diabetes mellitus ([place]) 04/28/2015   Schizoaffective disorder, depressive type ([place])    Hypertension associated with diabetes ([place]) 08/09/2013   Gout 08/09/2013   Diabetes mellitus ([place]) 07/09/2012    Orientation RESPIRATION BLADDER Height & Weight     Self, Place  Normal Incontinent Weight:   Height:     BEHAVIORAL SYMPTOMS/MOOD NEUROLOGICAL BOWEL NUTRITION STATUS      Incontinent    AMBULATORY STATUS COMMUNICATION OF NEEDS Skin   Limited Assist Verbally Normal                       Personal Care Assistance Level of Assistance  Bathing, Dressing Bathing Assistance: Limited assistance   Dressing Assistance: Limited assistance     Functional Limitations Info             SPECIAL CARE FACTORS FREQUENCY  OT (By licensed OT), PT (By licensed PT)     PT Frequency: 5x/wk OT Frequency: 5x/wk            Contractures      Additional Factors Info  Code Status, Allergies, Psychotropic Code Status Info: Full Allergies Info: see MAR Psychotropic Info: see MAR         Current Medications (05/13/2021):  This is the current hospital active medication list Current Facility-Administered Medications  Medication Dose Route Frequency Provider Last Rate Last Admin   acetaminophen (TYLENOL) tablet  650 mg  650 mg Oral Q6H PRN Etta Quill, DO       Or   acetaminophen (TYLENOL) suppository 650 mg  650 mg Rectal Q6H PRN Etta Quill, DO       amLODipine (NORVASC) tablet 10 mg  10 mg Oral Daily Gherghe, Costin M, MD       atorvastatin (LIPITOR) tablet 40 mg  40 mg Oral Daily Gherghe, Vella Redhead, MD       bacitracin ointment   Topical BID Jennette Kettle M, DO       bisacodyl (DULCOLAX) suppository 10 mg  10 mg Rectal Daily PRN Etta Quill, DO        carvedilol (COREG) tablet 25 mg  25 mg Oral BID WC Caren Griffins, MD       FLUoxetine (PROZAC) capsule 10 mg  10 mg Oral Daily Gherghe, Vella Redhead, MD       insulin aspart (novoLOG) injection 0-9 Units  0-9 Units Subcutaneous TID AC & HS Gherghe, Vella Redhead, MD       labetalol (NORMODYNE) injection 5-10 mg  5-10 mg Intravenous Q2H PRN Etta Quill, DO       mirtazapine (REMERON) tablet 7.5 mg  7.5 mg Oral QHS Caren Griffins, MD       ondansetron [facility]) tablet 4 mg  4 mg Oral Q6H PRN Etta Quill, DO       Or   ondansetron [facility]) injection 4 mg  4 mg Intravenous Q6H PRN Etta Quill, DO       paliperidone (INVEGA) 24 hr tablet 3 mg  3 mg Oral Daily Gherghe, Costin M, MD       pantoprozole (PROTONIX) 80 mg /NS 100 mL infusion  8 mg/hr Intravenous Continuous Orpah Greek, MD 10 mL/hr at 05/13/21 0357 8 mg/hr at 05/13/21 0357   polyethylene glycol (MIRALAX / GLYCOLAX) packet 17 g  17 g Oral Daily Gherghe, Vella Redhead, MD       senna-docusate (Senokot-S) tablet 2 tablet  2 tablet Oral BID Caren Griffins, MD         Discharge Medications: Please see discharge summary for a list of discharge medications.  Relevant Imaging Results:  Relevant Lab Results:   Additional Information SS# [id number]  Lennart Pall, LCSW

## 2021-05-13 NOTE — H&P (Addendum)
History and Physical    [name] DOB: [birth year] DOA: 05/12/2021  PCP: Boyce Medici, FNP  Patient coming from: SNF ([facility])  I have personally briefly reviewed patient's old medical records in [facility]  Chief Complaint: Constipation, emesis  HPI: [name] is a 60 y.o. male with medical history significant of EtOH abuse, cocaine abuse, DM2, malignant HTN, schizophrenia, chronic combined CHF.  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  He has had multiple smaller strokes since then including in May of 21 and again in May of 22 (last month) due to advanced CVD.  Also has dysarthria.  Pt resides in SNF at baseline now.  Pt with no BM for past 1 week per SNF notes.  They had put him on laxitives.  Today however, he had an episode of emesis.  Due to no BM for 1 week but emesis today he was sent in to ED.  History from pt limited due to dysarthria from strokes  Does have lower abd discomfort and NV.   ED Course: Pt with mild-mod stool burden on CT AP, not much else noted.  No vomiting for several hours in ED, but after discharge while awaiting transport back to SNF, he had an episode of coffee-ground emesis that was heme positive!  Repeat HGB still 13.x as it was at 2pm yesterday.  Pt put on protonix gtt.  Hospitalist asked to admit.   Review of Systems: As per HPI, otherwise all review of systems negative.  Past Medical History:  Diagnosis Date   Alcohol abuse    Arthritis    Crack cocaine use    Depression    Diabetes mellitus    Drug abuse ([place])    DTs (delirium tremens) ([place])    history of    Gout    Noncompliance with medication regimen    Schizophrenia ([place])    Stroke ([place])    Systolic heart failure secondary to hypertension ([place])    Uncontrolled hypertension     Past Surgical History:  Procedure Laterality Date   LEFT HEART CATH AND CORONARY ANGIOGRAPHY N/A  02/03/2018   Procedure: LEFT HEART CATH AND CORONARY ANGIOGRAPHY;  Surgeon: Jettie Booze, MD;  Location: [facility];  Service: Cardiovascular;  Laterality: N/A;   MANDIBLE RECONSTRUCTION     TONSILLECTOMY       reports that he has been smoking cigarettes. He has been smoking an average of 0.25 packs per day. He has never used smokeless tobacco. He reports previous alcohol use of about 1.0 - 2.0 standard drink of alcohol per week. He reports previous drug use. Drugs: "Crack" cocaine and Cocaine.  No Known Allergies  Family History  Problem Relation Age of Onset   Hypertension Other      Prior to Admission medications   Medication Sig Start Date End Date Taking? Authorizing Provider  bacitracin ointment Apply 1 application topically 2 (two) times daily. 05/12/21  Yes Couture, Cortni S, PA-C  acetaminophen (TYLENOL) 325 MG tablet Take 2 tablets (650 mg total) by mouth every 6 (six) hours as needed for mild pain (or Fever >/= 101). 02/10/20   Sheikh, Omair Latif, DO  amLODipine (NORVASC) 10 MG tablet Take 1 tablet (10 mg total) by mouth daily. 05/16/20   Angiulli, Lavon Paganini, PA-C  aspirin EC 325 MG EC tablet Take 1 tablet (325 mg total) by mouth daily. 05/16/20   Angiulli, Lavon Paganini, PA-C  atorvastatin (LIPITOR)  40 MG tablet Take 1 tablet (40 mg total) by mouth daily. 05/15/20   Angiulli, Lavon Paganini, PA-C  carvedilol (COREG) 25 MG tablet Take 1 tablet (25 mg total) by mouth 2 (two) times daily with a meal. 05/15/20   Angiulli, Lavon Paganini, PA-C  clopidogrel (PLAVIX) 75 MG tablet Take 1 tablet (75 mg total) by mouth daily. 05/15/20   Angiulli, Lavon Paganini, PA-C  cyclobenzaprine (FLEXERIL) 5 MG tablet Take 1 tablet (5 mg total) by mouth 3 (three) times daily as needed for muscle spasms. 05/15/20   Angiulli, Lavon Paganini, PA-C  diclofenac Sodium (VOLTAREN) 1 % GEL Apply 2 g topically 4 (four) times daily. 05/15/20   Angiulli, Lavon Paganini, PA-C  ergocalciferol (VITAMIN D2) 1.25 MG (50000 UT) capsule Take 1  capsule (50,000 Units total) by mouth once a week. 05/15/20   Angiulli, Lavon Paganini, PA-C  FLUoxetine (PROZAC) 10 MG capsule Take 1 capsule (10 mg total) by mouth daily. 05/15/20   Angiulli, Lavon Paganini, PA-C  folic acid (FOLVITE) 1 MG tablet Take 1 tablet (1 mg total) by mouth daily. 05/15/20   Angiulli, Lavon Paganini, PA-C  gabapentin (NEURONTIN) 300 MG capsule Take 2 capsules (600 mg total) by mouth 3 (three) times daily. 05/15/20   Angiulli, Lavon Paganini, PA-C  hydrALAZINE (APRESOLINE) 25 MG tablet Take 1 tablet (25 mg total) by mouth every 8 (eight) hours. 05/15/20   Angiulli, Lavon Paganini, PA-C  hydrOXYzine (ATARAX/VISTARIL) 25 MG tablet Take 1 tablet (25 mg total) by mouth 2 (two) times daily. 05/15/20   Angiulli, Lavon Paganini, PA-C  losartan (COZAAR) 100 MG tablet Take 1 tablet (100 mg total) by mouth daily. 05/16/20   Angiulli, Lavon Paganini, PA-C  metFORMIN (GLUCOPHAGE) 500 MG tablet Take 1 tablet (500 mg total) by mouth 2 (two) times daily with a meal. 05/15/20   Angiulli, Lavon Paganini, PA-C  mirtazapine (REMERON) 7.5 MG tablet Take 1 tablet (7.5 mg total) by mouth at bedtime. 05/15/20   Angiulli, Lavon Paganini, PA-C  Multiple Vitamin (DAILY-VITE) TABS Take 1 tablet by mouth daily. 03/03/20   [provider]  paliperidone (INVEGA) 3 MG 24 hr tablet Take 1 tablet (3 mg total) by mouth daily. 05/15/20   Angiulli, Lavon Paganini, PA-C  pantoprazole (PROTONIX) 40 MG tablet Take 1 tablet (40 mg total) by mouth daily. 05/16/20   Angiulli, Lavon Paganini, PA-C  SENNA PLUS 8.6-50 MG tablet Take 1 tablet by mouth daily. 03/03/20   [provider]    Physical Exam: Vitals:   05/13/21 0230 05/13/21 0300 05/13/21 0330 05/13/21 0400  BP: (!) 138/92 (!) 149/91 (!) 137/94 (!) 141/93  Pulse: 74 75 73 71  Resp: '18 20 19 20  '$ Temp:      TempSrc:      SpO2: 98% 97% 99% 99%    Constitutional: NAD, calm, comfortable Eyes: PERRL, lids and conjunctivae normal ENMT: Mucous membranes are moist. Posterior pharynx clear of any exudate or  lesions.Normal dentition.  Neck: normal, supple, no masses, no thyromegaly Respiratory: clear to auscultation bilaterally, no wheezing, no crackles. Normal respiratory effort. No accessory muscle use.  Cardiovascular: Regular rate and rhythm, no murmurs / rubs / gallops. No extremity edema. 2+ pedal pulses. No carotid bruits.  Abdomen: no tenderness, no masses palpated. No hepatosplenomegaly. Bowel sounds positive.  Musculoskeletal: no clubbing / cyanosis. No joint deformity upper and lower extremities. Good ROM, no contractures. Normal muscle tone.  Skin: no rashes, lesions, ulcers. No induration Neurologic: R sided weakness and dysarthria. Psychiatric: Alert,  seems oriented and able to correctly answer yes/no questions.  Indicates he understands we are admitting him to hospital for possible EGD because last episode of vomiting had blood in it.   Labs on Admission: I have personally reviewed following labs and imaging studies  CBC: Recent Labs  Lab 05/12/21 1407 05/13/21 0207  WBC 8.5 9.6  NEUTROABS 5.8  --   HGB 13.9 13.3  HCT 42.5 42.2  MCV 84.5 86.5  PLT 219 [id number]   Basic Metabolic Panel: Recent Labs  Lab 05/12/21 1407  NA 138  K 3.4*  CL 97*  CO2 26  GLUCOSE 108*  BUN 33*  CREATININE 1.39*  CALCIUM 9.6   GFR: CrCl cannot be calculated (Unknown ideal weight.). Liver Function Tests: Recent Labs  Lab 05/12/21 1407  AST 18  ALT 8  ALKPHOS 89  BILITOT 1.1  PROT 8.8*  ALBUMIN 4.8   Recent Labs  Lab 05/12/21 1407  LIPASE 46   No results for input(s): AMMONIA in the last 168 hours. Coagulation Profile: No results for input(s): INR, PROTIME in the last 168 hours. Cardiac Enzymes: No results for input(s): CKTOTAL, CKMB, CKMBINDEX, TROPONINI in the last 168 hours. BNP (last 3 results) No results for input(s): PROBNP in the last 8760 hours. HbA1C: No results for input(s): HGBA1C in the last 72 hours. CBG: No results for input(s): GLUCAP in the last 168  hours. Lipid Profile: No results for input(s): CHOL, HDL, LDLCALC, TRIG, CHOLHDL, LDLDIRECT in the last 72 hours. Thyroid Function Tests: No results for input(s): TSH, T4TOTAL, FREET4, T3FREE, THYROIDAB in the last 72 hours. Anemia Panel: No results for input(s): VITAMINB12, FOLATE, FERRITIN, TIBC, IRON, RETICCTPCT in the last 72 hours. Urine analysis:    Component Value Date/Time   COLORURINE YELLOW 05/12/2021 1810   APPEARANCEUR CLEAR 05/12/2021 1810   LABSPEC >1.046 (H) 05/12/2021 1810   PHURINE 5.0 05/12/2021 1810   GLUCOSEU NEGATIVE 05/12/2021 1810   HGBUR NEGATIVE 05/12/2021 1810   BILIRUBINUR NEGATIVE 05/12/2021 1810   KETONESUR 5 (A) 05/12/2021 1810   PROTEINUR NEGATIVE 05/12/2021 1810   UROBILINOGEN 0.2 08/27/2015 0210   NITRITE NEGATIVE 05/12/2021 1810   LEUKOCYTESUR NEGATIVE 05/12/2021 1810    Radiological Exams on Admission: DG Chest 2 View  Result Date: 05/12/2021 CLINICAL DATA:  Vomiting.  Rule out aspiration. EXAM: CHEST - 2 VIEW COMPARISON:  Chest x-ray 05/09/2020 CT abdomen pelvis 05/12/2021, chest x-ray 05/09/2020 FINDINGS: The heart size and mediastinal contours are within normal limits. No focal consolidation. No pulmonary edema. No pleural effusion. No pneumothorax. No acute osseous abnormality. Degenerative changes of the right shoulder with widening of the acromioclavicular joint. Query trace hiatal hernia. IMPRESSION: 1. No active cardiopulmonary disease. 2. Query trace hiatal hernia. Electronically Signed   By: Iven Finn M.D.   On: 05/12/2021 20:50   CT Head Wo Contrast  Result Date: 05/12/2021 CLINICAL DATA:  Headache.  History of strokes. EXAM: CT HEAD WITHOUT CONTRAST TECHNIQUE: Contiguous axial images were obtained from the base of the skull through the vertex without intravenous contrast. COMPARISON:  05/12/2020 FINDINGS: Brain: There is no evidence of an acute infarct, intracranial hemorrhage, mass, midline shift, or extra-axial fluid collection.  Patchy hypodensities in the cerebral white matter bilaterally have not significantly changed and are nonspecific but compatible with moderately age advanced chronic small vessel ischemic disease. A chronic lacunar infarct is again noted in the left basal ganglia. There is chronic dystrophic calcification medially in the left cerebellar hemisphere. New hypoattenuation lateral to the calcification  in the left cerebellar hemisphere extending into the middle cerebellar peduncle is compatible with encephalomalacia/an interval chronic infarct. A small focus of cortical encephalomalacia laterally in the left temporal lobe is unchanged. There is mild cerebral and cerebellar atrophy. Vascular: Calcified atherosclerosis at the skull base. Skull: No fracture or suspicious osseous lesion. Sinuses/Orbits: Visualized paranasal sinuses and mastoid air cells are clear. Unremarkable orbits. Other: None. IMPRESSION: 1. No evidence of acute intracranial abnormality. 2. Age advanced chronic small vessel ischemia with multiple chronic infarcts as above including an interval left cerebellar infarct. Electronically Signed   By: Logan Bores M.D.   On: 05/12/2021 20:13   CT ABDOMEN PELVIS W CONTRAST  Result Date: 05/12/2021 CLINICAL DATA:  Constipation for 1 week. Concern for bowel obstruction. EXAM: CT ABDOMEN AND PELVIS WITH CONTRAST TECHNIQUE: Multidetector CT imaging of the abdomen and pelvis was performed using the standard protocol following bolus administration of intravenous contrast. CONTRAST:  172m OMNIPAQUE IOHEXOL 300 MG/ML  SOLN COMPARISON:  None. FINDINGS: Lower chest: No acute findings. Hepatobiliary: No focal liver abnormality is seen. No gallstones, gallbladder wall thickening, or biliary dilatation. Pancreas: Unremarkable. No pancreatic ductal dilatation or surrounding inflammatory changes. Spleen: Normal in size without focal abnormality. Adrenals/Urinary Tract: No adrenal masses. Kidneys normal in size, orientation  and position with symmetric enhancement and excretion. No renal mass, stone or hydronephrosis. Normal ureters. Normal bladder. Stomach/Bowel: Normal stomach. Small bowel and colon are normal in caliber. No wall thickening. No inflammation. Mild to moderate increase in the colonic stool burden. Normal appendix visualized. Vascular/Lymphatic: Mild aortic atherosclerosis. No aneurysm. No enlarged lymph nodes. Reproductive: Unremarkable. Other: No abdominal wall hernia or abnormality. No abdominopelvic ascites. Musculoskeletal: No acute or significant osseous findings. IMPRESSION: 1. No acute findings. No evidence of bowel obstruction or inflammation. 2. Mild to moderate increase in the colonic stool burden. 3. Mild aortic atherosclerosis. Electronically Signed   By: DLajean ManesM.D.   On: 05/12/2021 16:05    EKG: Independently reviewed.  Assessment/Plan Principal Problem:   Hematemesis Active Problems:   Diabetes mellitus ([place]   Malignant hypertension   History of CVA with residual deficit   Chronic combined systolic (congestive) and diastolic (congestive) heart failure (HCC)    Hematemesis - Single episode of hematemesis in ED, gastroccult heme positive HGB stable at this point Message sent to Dr. SMichail Sermonfor AM GI consult NPO except sips with meds Tele monitor Repeat CBC in AM PPI bolus and gtt IVF: LR at 125 Malignant HTN - Home med rec pending Will put in for PRN labetalol in the meantime (want to stay ahead of the HTN given extensive h/o strokes, HTN urgency admits, etc). H/o CVA - Holding ASA + Plavix in setting of GIB Cont BP control Severe chronic deficits present Chronic combined CHF - Cont home meds when med rec completed DM2 - Hold metformin Sensitive SSI Q4H while NPO Constipation -  No PO laxatives at the moment given N/V and GIB Put in for PRN dulcolax supp if needed Hand wounds - Bacitracin  DVT prophylaxis: SCDs Code Status: Full Family Communication: No  family in room Disposition Plan: SNF after workup and treatment for UGIB Consults called: message sent to Dr. SMichail Sermonfor AM GI consult Admission status: Place in obs  Severity of Illness: The appropriate patient status for this patient is INPATIENT. Inpatient status is judged to be reasonable and necessary in order to provide the required intensity of service to ensure the patient's safety. The patient's presenting symptoms, physical exam findings,  and initial radiographic and laboratory data in the context of their chronic comorbidities is felt to place them at high risk for further clinical deterioration. Furthermore, it is not anticipated that the patient will be medically stable for discharge from the hospital within 2 midnights of admission. The following factors support the patient status of inpatient.     * I certify that at the point of admission it is my clinical judgment that the patient will require inpatient hospital care spanning beyond 2 midnights from the point of admission due to high intensity of service, high risk for further deterioration and high frequency of surveillance required.*   Theodoros Stjames M. DO [facility]  How to contact the [facility] Attending or Consulting provider [place] or covering provider during after hours [place], for this patient?  Check the care team in [facility] and look for a) attending/consulting [facility] provider listed and b) the [facility] team listed Log into [email or web address]  Amion Physician Scheduling and messaging for groups and whole hospitals  On call and physician scheduling software for group practices, residents, hospitalists and other medical providers for call, clinic, rotation and shift schedules. OnCall [employer] is a hospital-wide system for scheduling doctors and paging doctors on call. EasyPlot is for scientific plotting and data analysis.  [email or web address]  and use [place] universal password to access. If you do not have the password, please  contact the hospital operator.  Locate the [facility] provider you are looking for under [facility] and page to a number that you can be directly reached. If you still have difficulty reaching the provider, please page the [facility] (Director on Call) for the Hospitalists listed on amion for assistance.  05/13/2021, 4:14 AM

## 2021-05-13 NOTE — Consult Note (Signed)
Referring Provider: Dr. Alcario Drought Primary Care Physician:  Boyce Medici, FNP Primary Gastroenterologist:  Althia Forts  Reason for Consultation:  Hematemesis  HPI: [name] is a 60 y.o. male with multiple medical problems as stated below seen for a GI consult due to coffee grounds emesis that occurred after being sent to the ER for constipation while awaiting transport back to his SNF. Large amount of coffee grounds emesis (gastroccult positive) witnessed. Denies abdominal pain. Initial presentation to ER was due to constipation (without a BM in a week) and N/V for 2 days. He does not know whether the previous vomiting was black. On Plavix for history of CVAs. CT abd/pelvis negative for acute findings. Hgb 13.2.  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 Authorizing Provider  bacitracin ointment Apply 1 application topically 2 (two) times daily. 05/12/21  Yes Couture, Cortni S, PA-C  acetaminophen (TYLENOL) 325 MG tablet Take 2 tablets (650 mg total) by mouth every 6 (six) hours as needed for mild pain (or Fever >/= 101). 02/10/20   Sheikh, Omair Latif, DO  amLODipine (NORVASC) 10 MG tablet Take 1 tablet (10 mg total) by mouth daily. 05/16/20   Angiulli, Lavon Paganini, PA-C  aspirin EC 325 MG EC tablet Take 1 tablet (325 mg total) by mouth daily. 05/16/20   Angiulli, Lavon Paganini, PA-C  atorvastatin (LIPITOR) 40 MG tablet Take 1 tablet (40 mg total) by mouth daily. 05/15/20   Angiulli, Lavon Paganini, PA-C  carvedilol (COREG) 25 MG tablet Take 1 tablet (25 mg total) by mouth 2 (two) times daily with a meal. 05/15/20   Angiulli, Lavon Paganini, PA-C  clopidogrel (PLAVIX) 75 MG tablet Take 1 tablet (75 mg total) by mouth daily. 05/15/20   Angiulli, Lavon Paganini, PA-C  cyclobenzaprine (FLEXERIL) 5 MG tablet Take 1 tablet (5 mg total) by mouth 3 (three) times daily as needed for muscle spasms. 05/15/20   Angiulli, Lavon Paganini, PA-C  diclofenac Sodium (VOLTAREN) 1 % GEL Apply 2 g topically 4 (four) times daily. 05/15/20   Angiulli, Lavon Paganini, PA-C  ergocalciferol (VITAMIN D2) 1.25 MG (50000 UT) capsule Take 1 capsule (50,000 Units total) by mouth once a week. 05/15/20   Angiulli, Lavon Paganini, PA-C  FLUoxetine (PROZAC) 10 MG capsule Take 1 capsule (10 mg total) by mouth daily. 05/15/20   Angiulli, Lavon Paganini, PA-C  folic acid (FOLVITE) 1 MG tablet Take 1 tablet (1 mg total) by mouth daily. 05/15/20   Angiulli, Lavon Paganini, PA-C  gabapentin (NEURONTIN) 300 MG capsule Take 2 capsules (600 mg total) by mouth 3 (three) times daily. 05/15/20   Angiulli, Lavon Paganini, PA-C  hydrALAZINE (APRESOLINE) 25 MG tablet Take 1 tablet (25 mg total) by mouth every 8 (eight) hours. 05/15/20   Angiulli, Lavon Paganini, PA-C  hydrOXYzine (ATARAX/VISTARIL) 25 MG tablet Take 1 tablet (25 mg total) by mouth 2 (two) times daily. 05/15/20   Angiulli, Lavon Paganini, PA-C  losartan (COZAAR) 100 MG tablet Take 1 tablet (100 mg total) by mouth daily. 05/16/20   Angiulli, Lavon Paganini, PA-C  metFORMIN (GLUCOPHAGE) 500 MG tablet Take 1 tablet (500 mg total) by mouth 2 (two) times daily with a meal. 05/15/20   Angiulli, Lavon Paganini, PA-C  mirtazapine (REMERON) 7.5 MG tablet Take 1 tablet (7.5 mg total) by mouth at bedtime. 05/15/20   Angiulli, Lavon Paganini, PA-C   Multiple Vitamin (DAILY-VITE) TABS Take 1 tablet by mouth daily. 03/03/20   [provider]  paliperidone (INVEGA) 3 MG 24 hr tablet Take 1 tablet (3 mg total) by mouth daily. 05/15/20   Angiulli, Lavon Paganini, PA-C  pantoprazole (PROTONIX) 40 MG tablet Take 1 tablet (40 mg total) by mouth daily. 05/16/20   Angiulli, Lavon Paganini, PA-C  SENNA PLUS 8.6-50 MG tablet Take 1 tablet by mouth daily. 03/03/20   [provider]    Scheduled Meds:  bacitracin   Topical BID   insulin aspart  0-9 Units Subcutaneous Q4H   Continuous Infusions:  lactated ringers 125 mL/hr at 05/13/21 0403   pantoprazole 8 mg/hr (05/13/21 0357)   PRN Meds:.acetaminophen **OR** acetaminophen, bisacodyl, labetalol, ondansetron **OR** ondansetron (ZOFRAN) IV  Allergies as of 05/12/2021   (No Known Allergies)    Family History  Problem Relation Age of Onset   Hypertension Other     Social History   Socioeconomic History   Marital status: Legally Separated    Spouse name: Not on file   Number of children: Not on file   Years of education: Not on file   Highest education level: Not on file  Occupational History   Not on file  Tobacco Use   Smoking status: Every Day    Packs/day: 0.25    Pack years: 0.00    Types: Cigarettes   Smokeless tobacco: Never  Vaping Use   Vaping Use: Former  Substance and Sexual Activity   Alcohol use: Not Currently    Alcohol/week: 1.0 - 2.0 standard drink    Types: 1 - 2 Cans of beer per week    Comment: 2 cases of beer a day + liquour   Drug use: Not Currently    Types: "Crack" cocaine, Cocaine   Sexual activity: Not on file  Other Topics Concern   Not on file  Social History Narrative   Not on file   Social Determinants of Health   Financial Resource Strain: Not on file  Food Insecurity: Not on file  Transportation Needs: Not on file  Physical Activity: Not on file  Stress: Not on file  Social Connections: Not on file  Intimate Partner Violence: Not on  file    Review of Systems: All negative except as stated above in HPI.  Physical Exam: Vital signs: Vitals:   05/13/21 0400 05/13/21 0531  BP: (!) 141/93 (!) 156/103  Pulse: 71 71  Resp: 20 18  Temp:  98.7 F (37.1 C)  SpO2: 99% 99%   Last BM Date:  (patient unsure, he said he doesnt think he has has a bm here, was prev constip before coming) General:   Lethargic, chronically ill-appearing, thin, no acute distress  Head: normocephalic, atraumatic Eyes: anicteric sclera  ENT: oropharynx clear Neck: supple, nontender Lungs:  Clear throughout to auscultation.   No wheezes, crackles, or rhonchi. No acute distress. Heart:  Regular rate and rhythm; no murmurs, clicks, rubs,  or gallops. Abdomen: soft, nontender, nondistended, +BS  Rectal:  Deferred Ext: no edema  GI:  Lab Results: Recent Labs    05/12/21 1407 05/13/21 0207 05/13/21 0539  WBC 8.5 9.6 9.4  HGB 13.9 13.3 13.2  HCT 42.5 42.2 41.3  PLT 219 222 212   BMET Recent Labs    05/12/21 1407 05/13/21 0539  NA 138 139  K 3.4* 3.0*  CL 97* 98  CO2 26 29  GLUCOSE 108* 105*  BUN 33* 28*  CREATININE 1.39* 1.27*  CALCIUM 9.6 9.5   LFT Recent Labs    05/12/21 1407  PROT 8.8*  ALBUMIN 4.8  AST 18  ALT 8  ALKPHOS 89  BILITOT 1.1   PT/INR No results for input(s): LABPROT, INR in the last 72 hours.   Studies/Results: DG Chest 2 View  Result Date: 05/12/2021 CLINICAL DATA:  Vomiting.  Rule out aspiration. EXAM: CHEST - 2 VIEW COMPARISON:  Chest x-ray 05/09/2020 CT abdomen pelvis 05/12/2021, chest x-ray 05/09/2020 FINDINGS: The heart size and mediastinal contours are within normal limits. No focal consolidation. No pulmonary edema. No pleural effusion. No pneumothorax. No acute osseous abnormality. Degenerative changes of the right shoulder with widening of the acromioclavicular joint. Query trace hiatal hernia. IMPRESSION: 1. No active cardiopulmonary disease. 2. Query trace hiatal hernia. Electronically Signed    By: Iven Finn M.D.   On: 05/12/2021 20:50   CT Head Wo Contrast  Result Date: 05/12/2021 CLINICAL DATA:  Headache.  History of strokes. EXAM: CT HEAD WITHOUT CONTRAST TECHNIQUE: Contiguous axial images were obtained from the base of the skull through the vertex without intravenous contrast. COMPARISON:  05/12/2020 FINDINGS: Brain: There is no evidence of an acute infarct, intracranial hemorrhage, mass, midline shift, or extra-axial fluid collection. Patchy hypodensities in the cerebral white matter bilaterally have not significantly changed and are nonspecific but compatible with moderately age advanced chronic small vessel ischemic disease. A chronic lacunar infarct is again noted in the left basal ganglia. There is chronic dystrophic calcification medially in the left cerebellar hemisphere. New hypoattenuation lateral to the calcification in the left cerebellar hemisphere extending into the middle cerebellar peduncle is compatible with encephalomalacia/an interval chronic infarct. A small focus of cortical encephalomalacia laterally in the left temporal lobe is unchanged. There is mild cerebral and cerebellar atrophy. Vascular: Calcified atherosclerosis at the skull base. Skull: No fracture or suspicious osseous lesion. Sinuses/Orbits: Visualized paranasal sinuses and mastoid air cells are clear. Unremarkable orbits. Other: None. IMPRESSION: 1. No evidence of acute intracranial abnormality. 2. Age advanced chronic small vessel ischemia with multiple chronic infarcts as above including an interval left cerebellar infarct. Electronically Signed   By: Logan Bores M.D.   On: 05/12/2021 20:13   CT ABDOMEN PELVIS W CONTRAST  Result Date: 05/12/2021 CLINICAL DATA:  Constipation for 1 week. Concern for bowel obstruction. EXAM: CT ABDOMEN AND PELVIS WITH CONTRAST TECHNIQUE: Multidetector CT imaging of the abdomen and pelvis was performed using the standard protocol following bolus administration of  intravenous contrast. CONTRAST:  1102m OMNIPAQUE IOHEXOL 300 MG/ML  SOLN COMPARISON:  None. FINDINGS: Lower chest: No acute findings. Hepatobiliary: No focal liver abnormality is seen. No gallstones, gallbladder wall thickening, or biliary dilatation. Pancreas: Unremarkable. No pancreatic ductal dilatation or surrounding inflammatory changes. Spleen: Normal in size without focal abnormality.  Adrenals/Urinary Tract: No adrenal masses. Kidneys normal in size, orientation and position with symmetric enhancement and excretion. No renal mass, stone or hydronephrosis. Normal ureters. Normal bladder. Stomach/Bowel: Normal stomach. Small bowel and colon are normal in caliber. No wall thickening. No inflammation. Mild to moderate increase in the colonic stool burden. Normal appendix visualized. Vascular/Lymphatic: Mild aortic atherosclerosis. No aneurysm. No enlarged lymph nodes. Reproductive: Unremarkable. Other: No abdominal wall hernia or abnormality. No abdominopelvic ascites. Musculoskeletal: No acute or significant osseous findings. IMPRESSION: 1. No acute findings. No evidence of bowel obstruction or inflammation. 2. Mild to moderate increase in the colonic stool burden. 3. Mild aortic atherosclerosis. Electronically Signed   By: Lajean Manes M.D.   On: 05/12/2021 16:05    Impression/Plan: Coffee grounds emesis in the setting of Plavix. No melena. Hemodynamics stable. Hgb 13.2. Suspect erosive esophagitis vs peptic ulcer disease. No signs of ongoing bleeding. Would manage with supportive care and hold off on urgent EGD unless hemodynamics worsen or coffee grounds emesis persists. Continue Protonix drip. Plavix on hold. If Hgb remains stable, then resume Plavix in next 1-2 days but if coffee grounds emesis recurs or anemia develops then consider EGD in next 1-2 days. Will f/u.    LOS: 0 days   [name]  05/13/2021, 10:01 AM  Questions please call [phone]

## 2021-05-13 NOTE — Progress Notes (Signed)
Patient seen and examined this morning, admitted overnight by Dr. Alcario Drought, H&P reviewed and agree with the assessment and plan.  60 year old male with EtOH abuse, cocaine abuse, DM2, hypertension, schizophrenia, chronic combined CHF, prior multiple CVAs with residual right-sided weakness, aphasia living in a long-term SNF is being brought to the hospital due to concern for coffee-ground emesis.  He was in the ED yesterday morning due to nausea, vomiting, severe constipation, improved and was transported back to his SNF when on route he developed vomiting again with coffee-ground emesis and it was positive for blood.  He was brought back and admitted to the hospital  Doing well this morning, wants to go back to his nursing home.  Wants to eat.  No chest pain, no abdominal pain, no nausea or vomiting  Principal problem Coffee-ground emesis-possibly in the setting of ongoing repeated episodes of emesis.  GI consulted, recommending supportive care and continuing on Protonix drip and hold Plavix.  If hemoglobin remains stable resume Plavix in the next day or 2 but if he has recurrent coffee-ground emesis or worsening anemia then may need an EGD  Active problems History of multiple CVAs with residual right-sided weakness-hold antiplatelet agents as above  Essential hypertension-continue amlodipine, Coreg, hold hydralazine and ARB for now.  Moderately hypertensive today  Hyperlipidemia-continue statin  History of schizophrenia-continue home medications  Chronic combined CHF-appears euvolemic, stop fluids.  Most recent 2D echo in 2021 shows EF 45-50%, global hypokinesis of the left ventricle.  RV was normal  Type 2 diabetes mellitus-placed on sliding scale  Constipation-add laxatives  Hand wounds-continue bacitracin  Scheduled Meds:  amLODipine  10 mg Oral Daily   atorvastatin  40 mg Oral Daily   bacitracin   Topical BID   carvedilol  25 mg Oral BID WC   FLUoxetine  10 mg Oral Daily   insulin  aspart  0-9 Units Subcutaneous Q4H   mirtazapine  7.5 mg Oral QHS   paliperidone  3 mg Oral Daily   polyethylene glycol  17 g Oral Daily   senna-docusate  2 tablet Oral BID   Continuous Infusions:  lactated ringers 125 mL/hr at 05/13/21 0403   pantoprazole 8 mg/hr (05/13/21 0357)   PRN Meds:.acetaminophen **OR** acetaminophen, bisacodyl, labetalol, ondansetron **OR** ondansetron (ZOFRAN) IV  Zayyan Mullen M. Cruzita Lederer, MD, PhD [facility]  Between 7 am - 7 pm you can contact me via Amion (for emergencies) or [place] (non urgent matters).  I am not available 7 pm - 7 am, please contact night coverage MD/APP via Amion

## 2021-05-13 NOTE — ED Notes (Signed)
Patient vomited on [facility] truck. Returned for further work-up

## 2021-05-13 NOTE — ED Provider Notes (Signed)
Patient was awaiting transport back to nursing home when he had a large volume emesis.  Emesis appeared to be coffee-ground in nature.  This was Gastroccult positive.  A repeat CBC shows only very slight decrease in hemoglobin.  Vital signs are stable.  Reviewing his records, he is already on Protonix and does take Plavix because of multiple previous strokes.  Presentation suspicious for diffuse bleeding gastritis, cannot rule out peptic ulcer disease.  We will therefore place on Protonix drip and admit patient for further management.   Orpah Greek, MD 05/13/21 [phone]

## 2021-05-14 DIAGNOSIS — K920 Hematemesis: Secondary | ICD-10-CM | POA: Diagnosis not present

## 2021-05-14 DIAGNOSIS — I10 Essential (primary) hypertension: Secondary | ICD-10-CM

## 2021-05-14 DIAGNOSIS — E1159 Type 2 diabetes mellitus with other circulatory complications: Secondary | ICD-10-CM | POA: Diagnosis not present

## 2021-05-14 DIAGNOSIS — I5042 Chronic combined systolic (congestive) and diastolic (congestive) heart failure: Secondary | ICD-10-CM | POA: Diagnosis not present

## 2021-05-14 LAB — GLUCOSE, CAPILLARY
Glucose-Capillary: 109 mg/dL — ABNORMAL HIGH (ref 70–99)
Glucose-Capillary: 170 mg/dL — ABNORMAL HIGH (ref 70–99)
Glucose-Capillary: 96 mg/dL (ref 70–99)

## 2021-05-14 LAB — CBC
HCT: 39.1 % (ref 39.0–52.0)
Hemoglobin: 12.5 g/dL — ABNORMAL LOW (ref 13.0–17.0)
MCH: 27.2 pg (ref 26.0–34.0)
MCHC: 32 g/dL (ref 30.0–36.0)
MCV: 85 fL (ref 80.0–100.0)
Platelets: 196 10*3/uL (ref 150–400)
RBC: 4.6 MIL/uL (ref 4.22–5.81)
RDW: 13.4 % (ref 11.5–15.5)
WBC: 9 10*3/uL (ref 4.0–10.5)
nRBC: 0 % (ref 0.0–0.2)

## 2021-05-14 LAB — HEMOGLOBIN A1C
Hgb A1c MFr Bld: 6.1 % — ABNORMAL HIGH (ref 4.8–5.6)
Mean Plasma Glucose: 128 mg/dL

## 2021-05-14 MED ORDER — PANTOPRAZOLE SODIUM 40 MG PO TBEC: 40.0000 mg | DELAYED_RELEASE_TABLET | Freq: Two times a day (BID) | ORAL | 1 refills | Status: DC

## 2021-05-14 NOTE — Progress Notes (Signed)
[facility] Gastroenterology Progress Note  [name] 60 y.o. 05-31-1961   Subjective: No further vomiting. Resting in bed. Nurse at bedside.  Objective: Vital signs: Vitals:   05/14/21 0504 05/14/21 0617  BP: (!) 170/103 (!) 155/99  Pulse: 62 60  Resp: 17   Temp: 98.5 F (36.9 C)   SpO2: 94%     Physical Exam: Gen: chronically ill-appearing, lethargic, thin, no acute distress  HEENT: anicteric sclera CV: RRR Chest: CTA B Abd: soft, nontender, nondistended, +BS Ext: no edema  Lab Results: Recent Labs    05/12/21 1407 05/13/21 0539  NA 138 139  K 3.4* 3.0*  CL 97* 98  CO2 26 29  GLUCOSE 108* 105*  BUN 33* 28*  CREATININE 1.39* 1.27*  CALCIUM 9.6 9.5   Recent Labs    05/12/21 1407  AST 18  ALT 8  ALKPHOS 89  BILITOT 1.1  PROT 8.8*  ALBUMIN 4.8   Recent Labs    05/12/21 1407 05/13/21 0207 05/13/21 0539 05/14/21 0744  WBC 8.5   < > 9.4 9.0  NEUTROABS 5.8  --   --   --   HGB 13.9   < > 13.2 12.5*  HCT 42.5   < > 41.3 39.1  MCV 84.5   < > 85.3 85.0  PLT 219   < > 212 196   < > = values in this interval not displayed.      Assessment/Plan: Coffee grounds emesis - suspect due to [employer] tear from vomiting prior to coffee grounds emesis vs erosive esophagitis. Doubt peptic ulcer with no signs of ongoing bleeding and stable Hgb. Advance diet. Ok to resume Plavix from GI standpoint. PPI PO BID. No plans for EGD. Will sign off. Call if questions. D/W Dr. Cruzita Lederer.   [name] 05/14/2021, 9:11 AM  Questions please call [phone] Patient ID: [name], male   DOB: [birth year], 60 y.o.   MRN: [id number]

## 2021-05-14 NOTE — Discharge Summary (Signed)
Physician Discharge Summary  [name] [id number] DOB: 10-Dec-1960 DOA: 05/12/2021  PCP: Boyce Medici, FNP  Admit date: 05/12/2021 Discharge date: 05/14/2021  Admitted From: SNF Disposition:  SNF  Recommendations for Outpatient Follow-up:  Follow up with PCP in 1-2 weeks Please obtain BMP/CBC in one week  Home Health: none Equipment/Devices: none  Discharge Condition: stable CODE STATUS: Full code Diet recommendation: regular  HPI: Per admitting MD, [name] is a 60 y.o. male with medical history significant of EtOH abuse, cocaine abuse, DM2, malignant HTN, schizophrenia, chronic combined CHF.  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  He has had multiple smaller strokes since then including in May of 21 and again in May of 22 (last month) due to advanced CVD.  Also has dysarthria.  Pt resides in SNF at baseline now. Pt with no BM for past 1 week per SNF notes.  They had put him on laxitives. Today however, he had an episode of emesis.  Due to no BM for 1 week but emesis today he was sent in to ED. History from pt limited due to dysarthria from strokes Does have lower abd discomfort and NV.  Hospital Course / Discharge diagnoses: Principal problem Coffee-ground emesis-possibly in the setting of ongoing repeated episodes of emesis, concern for Mallory-Weiss tear versus esophagitis.  GI consulted, recommending supportive care and no role for an EGD.  His nausea and vomiting has resolved, he is tolerating a diet, his hemoglobin has remained stable and will be placed on a PPI and discharged in stable condition.   Active problems History of multiple CVAs with residual right-sided weakness-okay to resume antiplatelet agents per GI, he is also now on a PPI  Essential hypertension-continue home medications  Hyperlipidemia-continue statin History of schizophrenia-continue home medications Chronic combined CHF-appears  euvolemic, continue home medications  Type 2 diabetes mellitus-continue home medications Constipation-resolved with aggressive bowel regimen, continue on discharge Hand wounds-continue bacitracin  Sepsis ruled out   Discharge Instructions   Allergies as of 05/14/2021   No Known Allergies      Medication List     STOP taking these medications    acetaminophen 325 MG tablet Commonly known as: TYLENOL   cyclobenzaprine 5 MG tablet Commonly known as: FLEXERIL   diclofenac Sodium 1 % Gel Commonly known as: VOLTAREN   folic acid 1 MG tablet Commonly known as: FOLVITE   gabapentin 300 MG capsule Commonly known as: NEURONTIN   hydrALAZINE 25 MG tablet Commonly known as: APRESOLINE   hydrOXYzine 25 MG tablet Commonly known as: ATARAX/VISTARIL   mirtazapine 7.5 MG tablet Commonly known as: REMERON       TAKE these medications    aspirin EC 81 MG tablet Take 81 mg by mouth every morning. Swallow whole.   bacitracin ointment Apply 1 application topically 2 (two) times daily.   bisacodyl 10 MG suppository Commonly known as: DULCOLAX Place 10 mg rectally every morning.   chlorthalidone 50 MG tablet Commonly known as: HYGROTON Take 50 mg by mouth every morning.   docusate sodium 100 MG capsule Commonly known as: COLACE Take 100 mg by mouth 2 (two) times daily.   losartan 25 MG tablet Commonly known as: COZAAR Take 75 mg by mouth every morning.   ondansetron 4 MG disintegrating tablet Commonly known as: ZOFRAN-ODT Take 4 mg by mouth every 6 (six) hours.   paliperidone 6 MG 24 hr tablet Commonly known as: INVEGA Take 6 mg by mouth  every morning. What changed: Another medication with the same name was removed. Continue taking this medication, and follow the directions you see here.   pantoprazole 40 MG tablet Commonly known as: Protonix Take 1 tablet (40 mg total) by mouth 2 (two) times daily before a meal. What changed: when to take this    polyethylene glycol 17 g packet Commonly known as: MIRALAX / GLYCOLAX Take 17 g by mouth 2 (two) times daily. Mix in 8 oz liquid and drink   senna 8.6 MG Tabs tablet Commonly known as: SENOKOT Take 2 tablets by mouth at bedtime.       ASK your doctor about these medications    amLODipine 10 MG tablet Commonly known as: NORVASC Take 1 tablet (10 mg total) by mouth daily.   atorvastatin 40 MG tablet Commonly known as: LIPITOR Take 1 tablet (40 mg total) by mouth daily.   carvedilol 25 MG tablet Commonly known as: COREG Take 1 tablet (25 mg total) by mouth 2 (two) times daily with a meal.   clopidogrel 75 MG tablet Commonly known as: PLAVIX Take 1 tablet (75 mg total) by mouth daily.   FLUoxetine 10 MG capsule Commonly known as: PROZAC Take 1 capsule (10 mg total) by mouth daily.   metFORMIN 500 MG tablet Commonly known as: GLUCOPHAGE Take 1 tablet (500 mg total) by mouth 2 (two) times daily with a meal.        Follow-up Information     Odem, Coolidge Breeze, FNP. Schedule an appointment as soon as possible for a visit in 1 week.   Specialty: Nurse Practitioner Why: For recheck Contact information: [place] [address] [phone]         [place] DEPT.   Specialty: Emergency Medicine Why: Return to the ER with any new or worsening symptoms Contact information: [place] [id number] [place] [id number] [phone]                Consultations: Gastroenterology  Procedures/Studies:  DG Chest 2 View  Result Date: 05/12/2021 CLINICAL DATA:  Vomiting.  Rule out aspiration. EXAM: CHEST - 2 VIEW COMPARISON:  Chest x-ray 05/09/2020 CT abdomen pelvis 05/12/2021, chest x-ray 05/09/2020 FINDINGS: The heart size and mediastinal contours are within normal limits. No focal consolidation. No pulmonary edema. No pleural effusion. No pneumothorax. No acute osseous abnormality.  Degenerative changes of the right shoulder with widening of the acromioclavicular joint. Query trace hiatal hernia. IMPRESSION: 1. No active cardiopulmonary disease. 2. Query trace hiatal hernia. Electronically Signed   By: Iven Finn M.D.   On: 05/12/2021 20:50   CT Head Wo Contrast  Result Date: 05/12/2021 CLINICAL DATA:  Headache.  History of strokes. EXAM: CT HEAD WITHOUT CONTRAST TECHNIQUE: Contiguous axial images were obtained from the base of the skull through the vertex without intravenous contrast. COMPARISON:  05/12/2020 FINDINGS: Brain: There is no evidence of an acute infarct, intracranial hemorrhage, mass, midline shift, or extra-axial fluid collection. Patchy hypodensities in the cerebral white matter bilaterally have not significantly changed and are nonspecific but compatible with moderately age advanced chronic small vessel ischemic disease. A chronic lacunar infarct is again noted in the left basal ganglia. There is chronic dystrophic calcification medially in the left cerebellar hemisphere. New hypoattenuation lateral to the calcification in the left cerebellar hemisphere extending into the middle cerebellar peduncle is compatible with encephalomalacia/an interval chronic infarct. A small focus of cortical encephalomalacia laterally in the left temporal lobe is unchanged.  There is mild cerebral and cerebellar atrophy. Vascular: Calcified atherosclerosis at the skull base. Skull: No fracture or suspicious osseous lesion. Sinuses/Orbits: Visualized paranasal sinuses and mastoid air cells are clear. Unremarkable orbits. Other: None. IMPRESSION: 1. No evidence of acute intracranial abnormality. 2. Age advanced chronic small vessel ischemia with multiple chronic infarcts as above including an interval left cerebellar infarct. Electronically Signed   By: Logan Bores M.D.   On: 05/12/2021 20:13   CT ABDOMEN PELVIS W CONTRAST  Result Date: 05/12/2021 CLINICAL DATA:  Constipation for 1 week.  Concern for bowel obstruction. EXAM: CT ABDOMEN AND PELVIS WITH CONTRAST TECHNIQUE: Multidetector CT imaging of the abdomen and pelvis was performed using the standard protocol following bolus administration of intravenous contrast. CONTRAST:  133m OMNIPAQUE IOHEXOL 300 MG/ML  SOLN COMPARISON:  None. FINDINGS: Lower chest: No acute findings. Hepatobiliary: No focal liver abnormality is seen. No gallstones, gallbladder wall thickening, or biliary dilatation. Pancreas: Unremarkable. No pancreatic ductal dilatation or surrounding inflammatory changes. Spleen: Normal in size without focal abnormality. Adrenals/Urinary Tract: No adrenal masses. Kidneys normal in size, orientation and position with symmetric enhancement and excretion. No renal mass, stone or hydronephrosis. Normal ureters. Normal bladder. Stomach/Bowel: Normal stomach. Small bowel and colon are normal in caliber. No wall thickening. No inflammation. Mild to moderate increase in the colonic stool burden. Normal appendix visualized. Vascular/Lymphatic: Mild aortic atherosclerosis. No aneurysm. No enlarged lymph nodes. Reproductive: Unremarkable. Other: No abdominal wall hernia or abnormality. No abdominopelvic ascites. Musculoskeletal: No acute or significant osseous findings. IMPRESSION: 1. No acute findings. No evidence of bowel obstruction or inflammation. 2. Mild to moderate increase in the colonic stool burden. 3. Mild aortic atherosclerosis. Electronically Signed   By: DLajean ManesM.D.   On: 05/12/2021 16:05     Subjective: - no chest pain, shortness of breath, no abdominal pain, nausea or vomiting.   Discharge Exam: BP (!) 155/99   Pulse 60   Temp 98.5 F (36.9 C)   Resp 17   Ht '5\' 11"'$  (1.803 m)   Wt 79.4 kg   SpO2 94%   BMI 24.41 kg/m   General: Pt is alert, awake, not in acute distress Cardiovascular: RRR, S1/S2 +, no rubs, no gallops Respiratory: CTA bilaterally, no wheezing, no rhonchi Abdominal: Soft, NT, ND, bowel sounds  + Extremities: no edema, no cyanosis    The results of significant diagnostics from this hospitalization (including imaging, microbiology, ancillary and laboratory) are listed below for reference.     Microbiology: Recent Results (from the past 240 hour(s))  Resp Panel by RT-PCR (Flu A&B, Covid) Nasopharyngeal Swab     Status: None   Collection Time: 05/13/21  3:30 AM   Specimen: Nasopharyngeal Swab; Nasopharyngeal(NP) swabs in vial transport medium  Result Value Ref Range Status   SARS Coronavirus 2 by RT PCR NEGATIVE NEGATIVE Final    Comment: (NOTE) SARS-CoV-2 target nucleic acids are NOT DETECTED.  The SARS-CoV-2 RNA is generally detectable in upper respiratory specimens during the acute phase of infection. The lowest concentration of SARS-CoV-2 viral copies this assay can detect is 138 copies/mL. A negative result does not preclude SARS-Cov-2 infection and should not be used as the sole basis for treatment or other patient management decisions. A negative result may occur with  improper specimen collection/handling, submission of specimen other than nasopharyngeal swab, presence of viral mutation(s) within the areas targeted by this assay, and inadequate number of viral copies(<138 copies/mL). A negative result must be combined with clinical observations, patient history,  and epidemiological information. The expected result is Negative.  Fact Sheet for Patients:  EntrepreneurPulse.com.au  Fact Sheet for Healthcare Providers:  IncredibleEmployment.be  This test is no t yet approved or cleared by the [place] FDA and  has been authorized for detection and/or diagnosis of SARS-CoV-2 by FDA under an Emergency Use Authorization (EUA). This EUA will remain  in effect (meaning this test can be used) for the duration of the COVID-19 declaration under Section 564(b)(1) of the Act, 21 U.S.C.section 360bbb-3(b)(1), unless the authorization  is terminated  or revoked sooner.       Influenza A by PCR NEGATIVE NEGATIVE Final   Influenza B by PCR NEGATIVE NEGATIVE Final    Comment: (NOTE) The Xpert Xpress SARS-CoV-2/FLU/RSV plus assay is intended as an aid in the diagnosis of influenza from Nasopharyngeal swab specimens and should not be used as a sole basis for treatment. Nasal washings and aspirates are unacceptable for Xpert Xpress SARS-CoV-2/FLU/RSV testing.  Fact Sheet for Patients: EntrepreneurPulse.com.au  Fact Sheet for Healthcare Providers: IncredibleEmployment.be  This test is not yet approved or cleared by the [place] FDA and has been authorized for detection and/or diagnosis of SARS-CoV-2 by FDA under an Emergency Use Authorization (EUA). This EUA will remain in effect (meaning this test can be used) for the duration of the COVID-19 declaration under Section 564(b)(1) of the Act, 21 U.S.C. section 360bbb-3(b)(1), unless the authorization is terminated or revoked.  Performed at [facility], [place] [address], [place], [place] [address]      Labs: Basic Metabolic Panel: Recent Labs  Lab 05/12/21 1407 05/13/21 0539  NA 138 139  K 3.4* 3.0*  CL 97* 98  CO2 26 29  GLUCOSE 108* 105*  BUN 33* 28*  CREATININE 1.39* 1.27*  CALCIUM 9.6 9.5   Liver Function Tests: Recent Labs  Lab 05/12/21 1407  AST 18  ALT 8  ALKPHOS 89  BILITOT 1.1  PROT 8.8*  ALBUMIN 4.8   CBC: Recent Labs  Lab 05/12/21 1407 05/13/21 0207 05/13/21 0539 05/14/21 0744  WBC 8.5 9.6 9.4 9.0  NEUTROABS 5.8  --   --   --   HGB 13.9 13.3 13.2 12.5*  HCT 42.5 42.2 41.3 39.1  MCV 84.5 86.5 85.3 85.0  PLT 219 222 212 196   CBG: Recent Labs  Lab 05/13/21 0755 05/13/21 1150 05/13/21 1702 05/13/21 2037 05/14/21 0742  GLUCAP 106* 96 121* 107* 109*   Hgb A1c No results for input(s): HGBA1C in the last 72 hours. Lipid Profile No results for input(s): CHOL,  HDL, LDLCALC, TRIG, CHOLHDL, LDLDIRECT in the last 72 hours. Thyroid function studies No results for input(s): TSH, T4TOTAL, T3FREE, THYROIDAB in the last 72 hours.  Invalid input(s): FREET3 Urinalysis    Component Value Date/Time   COLORURINE YELLOW 05/12/2021 1810   APPEARANCEUR CLEAR 05/12/2021 1810   LABSPEC >1.046 (H) 05/12/2021 1810   PHURINE 5.0 05/12/2021 1810   GLUCOSEU NEGATIVE 05/12/2021 1810   HGBUR NEGATIVE 05/12/2021 1810   BILIRUBINUR NEGATIVE 05/12/2021 1810   KETONESUR 5 (A) 05/12/2021 1810   PROTEINUR NEGATIVE 05/12/2021 1810   UROBILINOGEN 0.2 08/27/2015 0210   NITRITE NEGATIVE 05/12/2021 1810   LEUKOCYTESUR NEGATIVE 05/12/2021 1810    FURTHER DISCHARGE INSTRUCTIONS:   Get Medicines reviewed and adjusted: Please take all your medications with you for your next visit with your Primary MD   Laboratory/radiological data: Please request your Primary MD to go over all hospital tests and procedure/radiological results at the  follow up, please ask your Primary MD to get all Hospital records sent to his/her office.   In some cases, they will be blood work, cultures and biopsy results pending at the time of your discharge. Please request that your primary care M.D. goes through all the records of your hospital data and follows up on these results.   Also Note the following: If you experience worsening of your admission symptoms, develop shortness of breath, life threatening emergency, suicidal or homicidal thoughts you must seek medical attention immediately by calling 911 or calling your MD immediately  if symptoms less severe.   You must read complete instructions/literature along with all the possible adverse reactions/side effects for all the Medicines you take and that have been prescribed to you. Take any new Medicines after you have completely understood and accpet all the possible adverse reactions/side effects.    Do not drive when taking Pain medications or  sleeping medications (Benzodaizepines)   Do not take more than prescribed Pain, Sleep and Anxiety Medications. It is not advisable to combine anxiety,sleep and pain medications without talking with your primary care practitioner   Special Instructions: If you have smoked or chewed Tobacco  in the last 2 yrs please stop smoking, stop any regular Alcohol  and or any Recreational drug use.   Wear Seat belts while driving.   Please note: You were cared for by a hospitalist during your hospital stay. Once you are discharged, your primary care physician will handle any further medical issues. Please note that NO REFILLS for any discharge medications will be authorized once you are discharged, as it is imperative that you return to your primary care physician (or establish a relationship with a primary care physician if you do not have one) for your post hospital discharge needs so that they can reassess your need for medications and monitor your lab values.  Time coordinating discharge: 40 minutes  SIGNED:  Marzetta Board, MD, PhD 05/14/2021, 9:45 AM

## 2021-05-14 NOTE — TOC Transition Note (Signed)
Transition of Care [facility]) - CM/SW Discharge Note   Patient Details  Name: [name] MRN: [id number] Date of Birth: [birth year]  Transition of Care [facility] CM/SW Contact:  Lennart Pall, LCSW Phone Number: 05/14/2021, 10:34 AM   Clinical Narrative:    Pt medically cleared for return today to [facility].  Have informed pt and his guardian, [name].  PTAR set up for 1:30 pick up.  RN to call report to [phone].  No further TOC needs.   Final next level of care: Skilled Nursing Facility Barriers to Discharge: Barriers Resolved   Patient Goals and CMS Choice Patient states their goals for this hospitalization and ongoing recovery are:: return to SNF      Discharge Placement   Existing PASRR number confirmed : 05/13/21          Patient chooses bed at: [facility] Patient to be transferred to facility by: [place] Name of family member notified: guardian, [name] Patient and family notified of of transfer: 05/14/21  Discharge Plan and Services     Post Acute Care Choice: [facility]          DME Arranged: N/A DME Agency: NA                  Social Determinants of Health (SDOH) Interventions     Readmission Risk Interventions No flowsheet data found.

## 2021-05-15 ENCOUNTER — Encounter (HOSPITAL_COMMUNITY): Payer: Self-pay | Admitting: Emergency Medicine

## 2021-05-15 ENCOUNTER — Emergency Department (HOSPITAL_COMMUNITY): Payer: Medicaid Other

## 2021-05-15 ENCOUNTER — Inpatient Hospital Stay (HOSPITAL_COMMUNITY)
Admission: EM | Admit: 2021-05-15 | Discharge: 2021-06-03 | DRG: 981 | Payer: Medicaid Other | Source: Skilled Nursing Facility | Attending: Internal Medicine | Admitting: Internal Medicine

## 2021-05-15 ENCOUNTER — Other Ambulatory Visit: Payer: Self-pay

## 2021-05-15 DIAGNOSIS — R1312 Dysphagia, oropharyngeal phase: Secondary | ICD-10-CM | POA: Diagnosis present

## 2021-05-15 DIAGNOSIS — L600 Ingrowing nail: Secondary | ICD-10-CM | POA: Diagnosis present

## 2021-05-15 DIAGNOSIS — Z7901 Long term (current) use of anticoagulants: Secondary | ICD-10-CM

## 2021-05-15 DIAGNOSIS — I10 Essential (primary) hypertension: Secondary | ICD-10-CM | POA: Diagnosis not present

## 2021-05-15 DIAGNOSIS — R471 Dysarthria and anarthria: Secondary | ICD-10-CM | POA: Diagnosis present

## 2021-05-15 DIAGNOSIS — E860 Dehydration: Secondary | ICD-10-CM | POA: Diagnosis present

## 2021-05-15 DIAGNOSIS — F1411 Cocaine abuse, in remission: Secondary | ICD-10-CM | POA: Diagnosis present

## 2021-05-15 DIAGNOSIS — F209 Schizophrenia, unspecified: Secondary | ICD-10-CM | POA: Diagnosis present

## 2021-05-15 DIAGNOSIS — I5042 Chronic combined systolic (congestive) and diastolic (congestive) heart failure: Secondary | ICD-10-CM | POA: Diagnosis present

## 2021-05-15 DIAGNOSIS — F17210 Nicotine dependence, cigarettes, uncomplicated: Secondary | ICD-10-CM | POA: Diagnosis present

## 2021-05-15 DIAGNOSIS — M1990 Unspecified osteoarthritis, unspecified site: Secondary | ICD-10-CM | POA: Diagnosis present

## 2021-05-15 DIAGNOSIS — N179 Acute kidney failure, unspecified: Secondary | ICD-10-CM | POA: Diagnosis not present

## 2021-05-15 DIAGNOSIS — E43 Unspecified severe protein-calorie malnutrition: Secondary | ICD-10-CM | POA: Diagnosis present

## 2021-05-15 DIAGNOSIS — Z6824 Body mass index (BMI) 24.0-24.9, adult: Secondary | ICD-10-CM

## 2021-05-15 DIAGNOSIS — R112 Nausea with vomiting, unspecified: Secondary | ICD-10-CM | POA: Diagnosis not present

## 2021-05-15 DIAGNOSIS — I69391 Dysphagia following cerebral infarction: Principal | ICD-10-CM

## 2021-05-15 DIAGNOSIS — E1169 Type 2 diabetes mellitus with other specified complication: Secondary | ICD-10-CM | POA: Diagnosis present

## 2021-05-15 DIAGNOSIS — Z7982 Long term (current) use of aspirin: Secondary | ICD-10-CM

## 2021-05-15 DIAGNOSIS — Z79899 Other long term (current) drug therapy: Secondary | ICD-10-CM

## 2021-05-15 DIAGNOSIS — R6339 Other feeding difficulties: Secondary | ICD-10-CM | POA: Diagnosis present

## 2021-05-15 DIAGNOSIS — I2510 Atherosclerotic heart disease of native coronary artery without angina pectoris: Secondary | ICD-10-CM | POA: Diagnosis present

## 2021-05-15 DIAGNOSIS — F1011 Alcohol abuse, in remission: Secondary | ICD-10-CM | POA: Diagnosis present

## 2021-05-15 DIAGNOSIS — Z8719 Personal history of other diseases of the digestive system: Secondary | ICD-10-CM

## 2021-05-15 DIAGNOSIS — Z8249 Family history of ischemic heart disease and other diseases of the circulatory system: Secondary | ICD-10-CM

## 2021-05-15 DIAGNOSIS — I6930 Unspecified sequelae of cerebral infarction: Secondary | ICD-10-CM | POA: Diagnosis not present

## 2021-05-15 DIAGNOSIS — Z7984 Long term (current) use of oral hypoglycemic drugs: Secondary | ICD-10-CM

## 2021-05-15 DIAGNOSIS — E876 Hypokalemia: Secondary | ICD-10-CM | POA: Diagnosis present

## 2021-05-15 DIAGNOSIS — Z4659 Encounter for fitting and adjustment of other gastrointestinal appliance and device: Secondary | ICD-10-CM

## 2021-05-15 DIAGNOSIS — Z20822 Contact with and (suspected) exposure to covid-19: Secondary | ICD-10-CM | POA: Diagnosis present

## 2021-05-15 DIAGNOSIS — I69351 Hemiplegia and hemiparesis following cerebral infarction affecting right dominant side: Secondary | ICD-10-CM

## 2021-05-15 DIAGNOSIS — F32A Depression, unspecified: Secondary | ICD-10-CM | POA: Diagnosis present

## 2021-05-15 DIAGNOSIS — M109 Gout, unspecified: Secondary | ICD-10-CM | POA: Diagnosis present

## 2021-05-15 DIAGNOSIS — Z7902 Long term (current) use of antithrombotics/antiplatelets: Secondary | ICD-10-CM

## 2021-05-15 DIAGNOSIS — E785 Hyperlipidemia, unspecified: Secondary | ICD-10-CM

## 2021-05-15 LAB — COMPREHENSIVE METABOLIC PANEL
ALT: 7 U/L (ref 0–44)
AST: 14 U/L — ABNORMAL LOW (ref 15–41)
Albumin: 4.3 g/dL (ref 3.5–5.0)
Alkaline Phosphatase: 82 U/L (ref 38–126)
Anion gap: 12 (ref 5–15)
BUN: 10 mg/dL (ref 6–20)
CO2: 29 mmol/L (ref 22–32)
Calcium: 9.4 mg/dL (ref 8.9–10.3)
Chloride: 98 mmol/L (ref 98–111)
Creatinine, Ser: 1.21 mg/dL (ref 0.61–1.24)
GFR, Estimated: >60 mL/min (ref >60)
Glucose, Bld: 115 mg/dL — ABNORMAL HIGH (ref 70–99)
Potassium: 3 mmol/L — ABNORMAL LOW (ref 3.5–5.1)
Sodium: 139 mmol/L (ref 135–145)
Total Bilirubin: 1 mg/dL (ref 0.3–1.2)
Total Protein: 7.9 g/dL (ref 6.5–8.1)

## 2021-05-15 LAB — CBC WITH DIFFERENTIAL/PLATELET
Abs Immature Granulocytes: 0.02 10*3/uL (ref 0.00–0.07)
Basophils Absolute: 0.1 10*3/uL (ref 0.0–0.1)
Basophils Relative: 1 %
Eosinophils Absolute: 0.2 10*3/uL (ref 0.0–0.5)
Eosinophils Relative: 2 %
HCT: 41.3 % (ref 39.0–52.0)
Hemoglobin: 13.1 g/dL (ref 13.0–17.0)
Immature Granulocytes: 0 %
Lymphocytes Relative: 25 %
Lymphs Abs: 2.1 10*3/uL (ref 0.7–4.0)
MCH: 27 pg (ref 26.0–34.0)
MCHC: 31.7 g/dL (ref 30.0–36.0)
MCV: 85.2 fL (ref 80.0–100.0)
Monocytes Absolute: 0.7 10*3/uL (ref 0.1–1.0)
Monocytes Relative: 9 %
Neutro Abs: 5.1 10*3/uL (ref 1.7–7.7)
Neutrophils Relative %: 63 %
Platelets: 224 10*3/uL (ref 150–400)
RBC: 4.85 MIL/uL (ref 4.22–5.81)
RDW: 13.2 % (ref 11.5–15.5)
WBC: 8.1 10*3/uL (ref 4.0–10.5)
nRBC: 0 % (ref 0.0–0.2)

## 2021-05-15 LAB — LIPASE, BLOOD: Lipase: 37 U/L (ref 11–51)

## 2021-05-15 MED ORDER — POTASSIUM CHLORIDE 10 MEQ/100ML IV SOLN
10.0000 meq | INTRAVENOUS | Status: AC
  Administered 2021-05-15 (×2): 10 meq via INTRAVENOUS
  Filled 2021-05-15 (×2): qty 100

## 2021-05-15 MED ORDER — POTASSIUM CHLORIDE 10 MEQ/100ML IV SOLN
10.0000 meq | INTRAVENOUS | Status: AC
  Administered 2021-05-15 – 2021-05-16 (×2): 10 meq via INTRAVENOUS
  Filled 2021-05-15 (×2): qty 100

## 2021-05-15 MED ORDER — LACTATED RINGERS IV BOLUS: 1000.0000 mL | Freq: Once | INTRAVENOUS | Status: DC

## 2021-05-15 MED ORDER — METOCLOPRAMIDE HCL 5 MG/ML IJ SOLN
10.0000 mg | Freq: Once | INTRAMUSCULAR | Status: AC
  Administered 2021-05-15: 10 mg via INTRAVENOUS
  Filled 2021-05-15: qty 2

## 2021-05-15 MED ORDER — HALOPERIDOL LACTATE 5 MG/ML IJ SOLN
5.0000 mg | Freq: Once | INTRAMUSCULAR | Status: AC
  Administered 2021-05-15: 5 mg via INTRAVENOUS
  Filled 2021-05-15: qty 1

## 2021-05-15 MED ORDER — LORAZEPAM 2 MG/ML IJ SOLN
1.0000 mg | Freq: Once | INTRAMUSCULAR | Status: AC
  Administered 2021-05-15: 1 mg via INTRAVENOUS
  Filled 2021-05-15: qty 1

## 2021-05-15 MED ORDER — LACTATED RINGERS IV BOLUS
500.0000 mL | Freq: Once | INTRAVENOUS | Status: AC
  Administered 2021-05-15: 500 mL via INTRAVENOUS

## 2021-05-15 MED ORDER — INSULIN ASPART 100 UNIT/ML IJ SOLN
0.0000 [IU] | INTRAMUSCULAR | Status: DC
  Administered 2021-05-17 – 2021-05-20 (×10): 1 [IU] via SUBCUTANEOUS
  Administered 2021-05-21 (×3): 2 [IU] via SUBCUTANEOUS
  Administered 2021-05-22 – 2021-05-25 (×10): 1 [IU] via SUBCUTANEOUS
  Administered 2021-05-25: 2 [IU] via SUBCUTANEOUS
  Administered 2021-05-25: 1 [IU] via SUBCUTANEOUS
  Administered 2021-05-27: 2 [IU] via SUBCUTANEOUS
  Administered 2021-05-27 – 2021-05-28 (×4): 1 [IU] via SUBCUTANEOUS
  Administered 2021-05-29: 2 [IU] via SUBCUTANEOUS
  Administered 2021-05-29 – 2021-05-30 (×3): 1 [IU] via SUBCUTANEOUS
  Administered 2021-05-31 – 2021-06-01 (×2): 2 [IU] via SUBCUTANEOUS
  Administered 2021-06-01 (×2): 1 [IU] via SUBCUTANEOUS
  Administered 2021-06-01 (×3): 2 [IU] via SUBCUTANEOUS
  Administered 2021-06-02: 1 [IU] via SUBCUTANEOUS
  Administered 2021-06-02: 2 [IU] via SUBCUTANEOUS
  Administered 2021-06-02: 1 [IU] via SUBCUTANEOUS
  Administered 2021-06-03: 2 [IU] via SUBCUTANEOUS
  Administered 2021-06-03: 1 [IU] via SUBCUTANEOUS
  Administered 2021-06-03: 2 [IU] via SUBCUTANEOUS

## 2021-05-15 NOTE — ED Notes (Signed)
Pt provided crackers and water for PO challenge per MD request, aprox 1 hour later pt had large yellow emesis. EDP [place] aware. Will continue to monitor.

## 2021-05-15 NOTE — ED Notes (Signed)
Pt transported to xray 

## 2021-05-15 NOTE — ED Triage Notes (Signed)
Patient brought in by [facility] from [facility] facility for nausea and vomiting. Patient discharged from inpatient 3 days ago. C/o generalized joint pain.

## 2021-05-15 NOTE — ED Provider Notes (Signed)
[name] Provider Note   CSN: [id number] Arrival date & time: 05/15/21  1412     History No chief complaint on file.   [name] is a 60 y.o. male.  Pt is a 60 y/o male with prior hx of EtOH abuse, cocaine abuse, DM2, malignant HTN, schizophrenia, chronic combined CHF, multiple prior strokes with persistent dysarthria and SNF residence at this time he was most recently seen in the hospital and discharged on 05/14/2021 after having persistent nausea vomiting and constipation.  At that time patient was treated symptomatically and was tolerating p.o.'s and discharged.  Patient returning from his skilled facility today due to ongoing nausea and vomiting.  They report since return from the hospital he has had recurrent nausea and vomiting.  He has had 5 episodes this morning.  Patient reports he just cannot keep anything down.  He continues to be nauseated.  He denies any localized abdominal pain.  No cough, shortness of breath or chest pain.  During patient's last hospitalization he had a CT of the abdomen pelvis which was negative and a head CT without acute findings.  Patient had been taking Zofran at his facility but it was not helping.  He also reports he has had no further bowel movement since returning home.  EMS reported that there was a lot of clear emesis at the facility but no evidence of blood.       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   CVA (cerebral vascular accident) ([place]) 04/18/2020   Right sided weakness 04/17/2020   History of CVA with residual deficit 04/17/2020   Chronic combined systolic (congestive) and diastolic (congestive) heart failure ([place]) 04/17/2020   Substance use disorder 04/17/2020   Abnormal EKG 02/07/2020   Left hip pain 02/07/2020   AKI (acute kidney injury) ([place]) [id number]   Systolic heart failure secondary to hypertension ([place])    Schizophrenia ([place])    Acute systolic heart failure ([place]) 02/02/2018   Chest pain 01/31/2018   Hypokalemia    Schizoaffective disorder, bipolar type ([place]) 11/22/2017   Trauma    Tachycardia 03/03/2017   Alcohol abuse    Cocaine abuse ([place])    Hypertensive urgency    Tachycardia    Cocaine abuse with cocaine-induced mood disorder ([place]) 12/05/2016   Cerebrovascular disease 09/02/2015   Neurocognitive disorder, unspecified secondary to cerebrovascular disease 09/02/2015   Malignant hypertension 08/30/2015   Alcohol use disorder, severe, dependence ([place]) 08/30/2015   Alcohol withdrawal ([place]) 08/30/2015   Alcohol abuse with alcohol-induced mood disorder ([place]) 08/30/2015   Thrombocytopenia ([place]) 08/27/2015   Tobacco use disorder 08/26/2015   Hyperlipidemia associated with type 2 diabetes mellitus ([place]) 04/28/2015   Schizoaffective disorder, depressive type ([place])    Hypertension associated with diabetes ([place]) 08/09/2013  Gout 08/09/2013   Diabetes mellitus ([place]) 07/09/2012    Past Surgical History:  Procedure Laterality Date   LEFT HEART CATH AND CORONARY ANGIOGRAPHY N/A 02/03/2018   Procedure: LEFT HEART CATH AND CORONARY ANGIOGRAPHY;  Surgeon: Jettie Booze, MD;  Location: [facility];  Service: Cardiovascular;  Laterality: N/A;   MANDIBLE RECONSTRUCTION     TONSILLECTOMY         Family History  Problem Relation Age of Onset   Hypertension Other      Social History   Tobacco Use   Smoking status: Every Day    Packs/day: 0.25    Pack years: 0.00    Types: Cigarettes   Smokeless tobacco: Never  Vaping Use   Vaping Use: Former  Substance Use Topics   Alcohol use: Not Currently    Alcohol/week: 1.0 - 2.0 standard drink    Types: 1 - 2 Cans of beer per week    Comment: 2 cases of beer a day + liquour   Drug use: Not Currently    Types: "Crack" cocaine, Cocaine    Home Medications Prior to Admission medications   Medication Sig Start Date End Date Taking? Authorizing Provider  amLODipine (NORVASC) 10 MG tablet Take 1 tablet (10 mg total) by mouth daily. Patient taking differently: Take 10 mg by mouth every morning. 05/16/20   Angiulli, Lavon Paganini, PA-C  aspirin EC 81 MG tablet Take 81 mg by mouth every morning. Swallow whole.    [provider]  atorvastatin (LIPITOR) 40 MG tablet Take 1 tablet (40 mg total) by mouth daily. Patient taking differently: Take 40 mg by mouth daily at 6 PM. 05/15/20   Angiulli, Lavon Paganini, PA-C  bacitracin ointment Apply 1 application topically 2 (two) times daily. 05/12/21   Couture, Cortni S, PA-C  bisacodyl (DULCOLAX) 10 MG suppository Place 10 mg rectally every morning.    [provider]  carvedilol (COREG) 25 MG tablet Take 1 tablet (25 mg total) by mouth 2 (two) times daily with a meal. Patient taking differently: Take 25 mg by mouth 2 (two) times daily. 05/15/20   Angiulli, Lavon Paganini, PA-C  chlorthalidone (HYGROTON) 50 MG tablet Take 50 mg by mouth every morning.    [provider]  clopidogrel (PLAVIX) 75 MG tablet Take 1 tablet (75 mg total) by mouth daily. Patient taking differently: Take 75 mg by mouth every morning. 05/15/20   Angiulli, Lavon Paganini, PA-C  docusate sodium (COLACE) 100 MG capsule Take 100 mg by mouth 2 (two) times daily.    [provider]  FLUoxetine (PROZAC) 10 MG capsule Take 1 capsule (10 mg total) by mouth daily. Patient taking differently:  Take 10 mg by mouth every morning. 05/15/20   Angiulli, Lavon Paganini, PA-C  losartan (COZAAR) 25 MG tablet Take 75 mg by mouth every morning.    [provider]  metFORMIN (GLUCOPHAGE) 500 MG tablet Take 1 tablet (500 mg total) by mouth 2 (two) times daily with a meal. Patient taking differently: Take 500 mg by mouth daily with breakfast. 05/15/20   Angiulli, Lavon Paganini, PA-C  ondansetron (ZOFRAN-ODT) 4 MG disintegrating tablet Take 4 mg by mouth every 6 (six) hours.    [provider]  paliperidone (INVEGA) 6 MG 24 hr tablet Take 6 mg by mouth every morning.    [provider]  pantoprazole (PROTONIX) 40 MG tablet Take 1 tablet (40 mg total) by mouth 2 (two) times daily before a meal. 05/14/21 05/14/22  Caren Griffins, MD  polyethylene glycol (MIRALAX / GLYCOLAX) 17 g packet Take 17 g by mouth 2 (two) times daily. Mix in 8 oz liquid and drink    [provider]  senna (SENOKOT) 8.6 MG TABS tablet Take 2 tablets by mouth at bedtime.    [provider]    Allergies    Patient has no known allergies.  Review of Systems   Review of Systems  All other systems reviewed and are negative.  Physical Exam Updated Vital Signs BP (!) 163/115   Pulse 72   Temp 97.8 F (36.6 C)   Resp 18   Ht '5\' 11"'$  (1.803 m)   Wt 79.4 kg   SpO2 99%   BMI 24.41 kg/m   Physical Exam Vitals and nursing note reviewed.  Constitutional:      General: He is not in acute distress.    Appearance: He is well-developed.  HENT:     Head: Normocephalic and atraumatic.     Mouth/Throat:     Mouth: Mucous membranes are moist.  Eyes:     Conjunctiva/sclera: Conjunctivae normal.     Pupils: Pupils are equal, round, and reactive to light.  Cardiovascular:     Rate and Rhythm: Normal rate and regular rhythm.     Heart sounds: No murmur heard. Pulmonary:     Effort: Pulmonary effort is normal. No respiratory distress.     Breath sounds: Normal breath sounds. No wheezing or rales.   Abdominal:     General: There is distension.     Palpations: Abdomen is soft.     Tenderness: There is no abdominal tenderness. There is no right CVA tenderness, left CVA tenderness, guarding or rebound.  Musculoskeletal:        General: No tenderness. Normal range of motion.     Cervical back: Normal range of motion and neck supple.     Right lower leg: No edema.     Left lower leg: No edema.  Skin:    General: Skin is warm and dry.     Capillary Refill: Capillary refill takes less than 2 seconds.     Findings: No erythema or rash.  Neurological:     Mental Status: He is alert and oriented to person, place, and time. Mental status is at baseline.     Comments: Dysarthria  Psychiatric:        Mood and Affect: Mood normal.        Behavior: Behavior normal.    ED Results / Procedures / Treatments   Labs (all labs ordered are listed, but only abnormal results are displayed) Labs Reviewed  COMPREHENSIVE METABOLIC PANEL - Abnormal; Notable for the following components:      Result Value   Potassium 3.0 (*)    Glucose, Bld 115 (*)    AST 14 (*)    All other components within normal limits  CBC WITH DIFFERENTIAL/PLATELET  LIPASE, BLOOD    EKG EKG Interpretation  Date/Time:  Tuesday May 15 2021 14:22:10 EDT Ventricular Rate:  71 PR Interval:  189 QRS Duration: 111 QT Interval:  395 QTC Calculation: 430 R Axis:   -42 Text Interpretation: Sinus rhythm Incomplete left bundle branch block Borderline ST elevation, anterior leads No significant change since last tracing Confirmed by Blanchie Dessert [phone]) on 05/15/2021 2:29:50 PM  Radiology No results found.  Procedures Procedures   Medications Ordered in ED Medications - No data to display  ED Course  I have reviewed the  triage vital signs and the nursing notes.  Pertinent labs & imaging results that were available during my care of the patient were reviewed by me and considered in my medical decision making (see  chart for details).    MDM Rules/Calculators/A&P                          60 year old male with multiple medical problems returning today from his skilled facility for persistent nausea and vomiting.  Patient has no localized abdominal pain at this time but does have some mild distention.  He does not have overt signs of fluid overload on exam today.  He is denying any chest pain or shortness of breath.  EKG without evidence of any ST elevation or acute changes.  Patient was recently hospitalized for the exact same symptoms.  At that time he improved with supportive care.  GI did not feel that he needed a EGD at the time.  CT of the abdomen pelvis showed mild to moderate stool burden but no other acute findings.  Today patient is awake and alert.  He is not tachycardic but he is intermittently spitting into a bag.  Pt was just d/ced yesterday. We will treat nausea.  Repeat labs and acute abdominal series.  Will ensure no evidence of atypical cardiac pathology.  3:52 PM Patient's labs within normal CBC, CMP with hypokalemia with a potassium of 3.0.  On repeat evaluation patient is still spitting in a bag and just reports that he is thirsty and needs something to eat.  He did not notice improvement with Reglan.  We will try Haldol.  EKG without acute findings today.  X-ray is still pending.  Final Clinical Impression(s) / ED Diagnoses Final diagnoses:  None    Rx / DC Orders ED Discharge Orders     None        Blanchie Dessert, MD 05/15/21 1642

## 2021-05-15 NOTE — ED Provider Notes (Signed)
9:39 PM Care of the patient had been assumed at signout several hours ago.  On signout we are waiting ongoing potassium repletion via IV, fluid resuscitation and efforts for antiemetics. X-ray reviewed, no evidence for obstructive pattern, belly remains soft, but the patient has persistent nausea, vomiting, and given his hypokalemia, absence of improvement following Haldol, Ativan, Zofran, he will be admitted for further monitoring, management of intractable nausea, vomiting.   Carmin Muskrat, MD 05/15/21 2139

## 2021-05-15 NOTE — H&P (Signed)
Catcher Hoerig [id number] DOB: [birth year] DOA: 05/15/2021      PCP: Boyce Medici, FNP   Outpatient Specialists:  CARDS: Dr. Alphia Moh, MD [facility]    Patient arrived to ER on 05/15/21 at 1412 Referred by Attending Carmin Muskrat, MD   Patient coming from:  From facility [facility]  Chief Complaint:  Chief Complaint  Patient presents with   Nausea   Emesis    HPI: [name] is a 60 y.o. male with medical history significant of  EtOH abuse, cocaine abuse, DM2, malignant HTN, schizophrenia, chronic combined CHF, multiple prior strokes with persistent dysarthria residual R sided weakness chronically    Presented with ongoing nausea and vomiting  Was recently admitted  for persistent nausea vomiting and constipation 05/14/2021 No abd pain During patient's last hospitalization he had a CT of the abdomen pelvis which was negative and a head CT without acute findings.  Patient had been taking Zofran at his facility but it was not helping. During his recent admission was noted to have coffee-ground emesis felt to be secondary to Mallory-Weiss tear GI consulted not recommended holding off on EGD.  Initially nausea and vomiting resolved although it has resumed once patient went back to nursing home facility.  Hemoglobin did remain stable and he was added on PPI       Initial COVID TEST  NEGATIVE 2 days ago  Lab Results  Component Value Date   [place] 05/13/2021   [place] NEGATIVE 04/18/2020   [place] NEGATIVE 02/07/2020     Regarding pertinent Chronic problems:    Hyperlipidemia -  on statins Lipitor Lipid Panel     Component Value Date/Time   CHOL 195 04/18/2020 0556   TRIG 183 (H) 04/18/2020 0556   HDL 44 04/18/2020 0556   CHOLHDL 4.4 04/18/2020 0556   VLDL 37 04/18/2020 0556   LDLCALC 114 (H) 04/18/2020 0556   Hx of schizophrenia - Paliperidone   HTN on norvasc, coreg, chlorthalidone, losartan   chronic CHF  diastolic/systolic/ combined - last echoJune 2021 EF 45 %      DM 2 -  Lab Results  Component Value Date   HGBA1C 6.1 (H) 05/13/2021   on  PO meds only    Hx of CVA -  with/  residual deficits on Aspirin 81 mg, was on Plavix   While in ER: Noted to have hypokalemia with potassium down to 3.0.  Imaging showing no evidence of infiltrate or obstipation.  Being admitted for recurrent nausea vomiting   ED Triage Vitals  Enc Vitals Group     BP 05/15/21 1420 (!) 163/115     Pulse Rate 05/15/21 1420 72     Resp 05/15/21 1420 18     Temp 05/15/21 1420 97.8 F (36.6 C)     Temp src --      SpO2 05/15/21 1420 99 %     Weight 05/15/21 1421 175 lb (79.4 kg)     Height 05/15/21 1421 '5\' 11"'$  (1.803 m)     Head Circumference --      Peak Flow --      Pain Score 05/15/21 1421 5     Pain Loc --      Pain Edu? --      Excl. in [place]? --   TMAX(24)@     _________________________________________ Significant initial  Findings: Abnormal Labs Reviewed  COMPREHENSIVE METABOLIC PANEL - Abnormal; Notable for the following components:      Result Value  Potassium 3.0 (*)    Glucose, Bld 115 (*)    AST 14 (*)    All other components within normal limits   ____________________________________________ Ordered CT HEAD  05/12/2021 multiple chronic infarcts as above including an interval left cerebellar infarct.  CXR -  NON acute  CTabd/pelvis - non acute   _________________________ Troponin ordered ECG: Ordered Personally reviewed by me showing: HR : 71 Rhythm:  NSR,    no evidence of ischemic changes QTC 430  The recent clinical data is shown below. Vitals:   05/15/21 1930 05/15/21 1945 05/15/21 2000 05/15/21 2100  BP: 140/85 (!) 148/92 (!) 154/93 (!) 179/93  Pulse: 77 74 91 73  Resp: 15 14 (!) 23 20  Temp:      SpO2: 99% 97% 98% 97%  Weight:      Height:         WBC     Component Value Date/Time   WBC 8.1 05/15/2021 1431   LYMPHSABS 2.1 05/15/2021 1431   MONOABS 0.7  05/15/2021 1431   EOSABS 0.2 05/15/2021 1431   BASOSABS 0.1 05/15/2021 1431     Lactic Acid, Venous    Component Value Date/Time   LATICACIDVEN 2.9 (HH) 08/27/2015 0231      UA  ordered   Urine analysis:    Component Value Date/Time   COLORURINE YELLOW 05/12/2021 1810   APPEARANCEUR CLEAR 05/12/2021 1810   LABSPEC >1.046 (H) 05/12/2021 1810   PHURINE 5.0 05/12/2021 1810   GLUCOSEU NEGATIVE 05/12/2021 1810   HGBUR NEGATIVE 05/12/2021 1810   BILIRUBINUR NEGATIVE 05/12/2021 1810   KETONESUR 5 (A) 05/12/2021 1810   PROTEINUR NEGATIVE 05/12/2021 1810   UROBILINOGEN 0.2 08/27/2015 0210   NITRITE NEGATIVE 05/12/2021 1810   LEUKOCYTESUR NEGATIVE 05/12/2021 1810    Results for orders placed or performed during the hospital encounter of 05/12/21  Resp Panel by RT-PCR (Flu A&B, Covid) Nasopharyngeal Swab     Status: None   Collection Time: 05/13/21  3:30 AM   Specimen: Nasopharyngeal Swab; Nasopharyngeal(NP) swabs in vial transport medium  Result Value Ref Range Status   SARS Coronavirus 2 by RT PCR NEGATIVE NEGATIVE Final         Influenza A by PCR NEGATIVE NEGATIVE Final   Influenza B by PCR NEGATIVE NEGATIVE Final          _______________________________________________ Hospitalist was called for admission for recurrent nausea vomiting hyponatremia  The following Work up has been ordered so far:  Orders Placed This Encounter  Procedures   SARS CORONAVIRUS 2 (TAT 6-24 HRS) Nasopharyngeal Nasopharyngeal Swab   DG Abdomen Acute W/Chest   CBC with Differential/Platelet   Comprehensive metabolic panel   Lipase, blood   Consult to hospitalist   ED EKG   EKG 12-Lead      Following Medications were ordered in ER: Medications  metoCLOPramide (REGLAN) injection 10 mg (10 mg Intravenous Given 05/15/21 1441)  lactated ringers bolus 500 mL (0 mLs Intravenous Stopped 05/15/21 1630)  potassium chloride 10 mEq in 100 mL IVPB (0 mEq Intravenous Stopped 05/15/21 1847)  haloperidol  lactate (HALDOL) injection 5 mg (5 mg Intravenous Given 05/15/21 1635)  LORazepam (ATIVAN) injection 1 mg (1 mg Intravenous Given 05/15/21 2108)        Consult Orders  (From admission, onward)           Start     Ordered   05/15/21 2139  Consult to hospitalist  Page by Delana Meyer  Once  Provider:  (Not yet assigned)  Question Answer Comment  Place call to: [facility]   Reason for Consult Admit      05/15/21 2138            OTHER Significant initial  Findings:  labs showing:    Recent Labs  Lab 05/12/21 1407 05/13/21 0539 05/15/21 1431  NA 138 139 139  K 3.4* 3.0* 3.0*  CO2 '26 29 29  '$ GLUCOSE 108* 105* 115*  BUN 33* 28* 10  CREATININE 1.39* 1.27* 1.21  CALCIUM 9.6 9.5 9.4    Cr   stable,  Lab Results  Component Value Date   CREATININE 1.21 05/15/2021   CREATININE 1.27 (H) 05/13/2021   CREATININE 1.39 (H) 05/12/2021    Recent Labs  Lab 05/12/21 1407 05/15/21 1431  AST 18 14*  ALT 8 7  ALKPHOS 89 82  BILITOT 1.1 1.0  PROT 8.8* 7.9  ALBUMIN 4.8 4.3   Lab Results  Component Value Date   CALCIUM 9.4 05/15/2021   PHOS 3.5 04/18/2020        Plt: Lab Results  Component Value Date   PLT 224 05/15/2021    COVID-19 Labs  No results for input(s): DDIMER, FERRITIN, LDH, CRP in the last 72 hours.  Lab Results  Component Value Date   SARSCOV2NAA NEGATIVE 05/13/2021   SARSCOV2NAA NEGATIVE 04/18/2020   [place] NEGATIVE 02/07/2020     Recent Labs  Lab 05/12/21 1407 05/13/21 0207 05/13/21 0539 05/14/21 0744 05/15/21 1431  WBC 8.5 9.6 9.4 9.0 8.1  NEUTROABS 5.8  --   --   --  5.1  HGB 13.9 13.3 13.2 12.5* 13.1  HCT 42.5 42.2 41.3 39.1 41.3  MCV 84.5 86.5 85.3 85.0 85.2  PLT 219 222 212 196 224    HG/HCT  stable,      Component Value Date/Time   HGB 13.1 05/15/2021 1431   HCT 41.3 05/15/2021 1431   MCV 85.2 05/15/2021 1431      Recent Labs  Lab 05/12/21 1407 05/15/21 1431  LIPASE 46 37      DM  labs:   HbA1C: Recent Labs    05/13/21 0539  HGBA1C 6.1*       CBG (last 3)  Recent Labs    05/14/21 0742 05/14/21 1139 05/14/21 1730  GLUCAP 109* 170* 96    Cultures:    Component Value Date/Time   SDES URINE, CLEAN CATCH 05/10/2020 0530   SPECREQUEST  05/10/2020 0530    NONE Performed at [facility] [address] [place], [place] [address]    CULT 30,000 COLONIES/mL ENTEROCOCCUS FAECALIS (A) 05/10/2020 0530   REPTSTATUS 05/12/2020 FINAL 05/10/2020 0530     Radiological Exams on Admission: DG Abdomen Acute W/Chest  Result Date: 05/15/2021 CLINICAL DATA:  Nausea and vomiting for 2 days. EXAM: DG ABDOMEN ACUTE WITH 1 VIEW CHEST COMPARISON:  None. FINDINGS: There is no evidence of dilated bowel loops or free intraperitoneal air. A moderate amount of stool is seen within the descending colon. No radiopaque calculi or other significant radiographic abnormality is seen. The cardiac silhouette is mildly enlarged and unchanged in size. Both lungs are clear. A chronic deformity is seen involving the distal right clavicle. IMPRESSION: Negative abdominal radiographs.  No acute cardiopulmonary disease. Electronically Signed   By: Virgina Norfolk M.D.   On: 05/15/2021 16:37   _______________________________________________________________________________________________________ Latest  Blood pressure (!) 179/93, pulse 73, temperature 97.8 F (36.6 C), resp. rate 20, height '5\' 11"'$  (1.803  m), weight 79.4 kg, SpO2 97 %.   Review of Systems:    Pertinent positives include:  fatigue,  nausea, vomiting,  Constitutional:  No weight loss, night sweats, Fevers, chills, weight loss  HEENT:  No headaches, Difficulty swallowing,Tooth/dental problems,Sore throat,  No sneezing, itching, ear ache, nasal congestion, post nasal drip,  Cardio-vascular:  No chest pain, Orthopnea, PND, anasarca, dizziness, palpitations.no Bilateral lower extremity swelling  GI:  No heartburn, indigestion,  abdominal pain, diarrhea, change in bowel habits, loss of appetite, melena, blood in stool, hematemesis Resp:  no shortness of breath at rest. No dyspnea on exertion, No excess mucus, no productive cough, No non-productive cough, No coughing up of blood.No change in color of mucus.No wheezing. Skin:  no rash or lesions. No jaundice GU:  no dysuria, change in color of urine, no urgency or frequency. No straining to urinate.  No flank pain.  Musculoskeletal:  No joint pain or no joint swelling. No decreased range of motion. No back pain.  Psych:  No change in mood or affect. No depression or anxiety. No memory loss.  Neuro: no localizing neurological complaints, no tingling, no weakness, no double vision, no gait abnormality, no slurred speech, no confusion  All systems reviewed and apart from [place] all are negative _______________________________________________________________________________________________ Past Medical History:   Past Medical History:  Diagnosis Date   Alcohol abuse    Arthritis    Crack cocaine use    Depression    Diabetes mellitus    Drug abuse ([place])    DTs (delirium tremens) ([place])    history of    Gout    Noncompliance with medication regimen    Schizophrenia ([place])    Stroke ([place])    Systolic heart failure secondary to hypertension ([place])    Uncontrolled hypertension       Past Surgical History:  Procedure Laterality Date   LEFT HEART CATH AND CORONARY ANGIOGRAPHY N/A 02/03/2018   Procedure: LEFT HEART CATH AND CORONARY ANGIOGRAPHY;  Surgeon: Jettie Booze, MD;  Location: [facility];  Service: Cardiovascular;  Laterality: N/A;   MANDIBLE RECONSTRUCTION     TONSILLECTOMY      Social History:     reports that he has been smoking cigarettes. He has been smoking an average of 0.25 packs per day. He has never used smokeless tobacco. He reports previous alcohol use of about 1.0 - 2.0 standard drink of alcohol per week. He reports previous  drug use. Drugs: "Crack" cocaine and Cocaine.     Family History:   Family History  Problem Relation Age of Onset   Hypertension Other    ______________________________________________________________________________________________ Allergies: No Known Allergies   Prior to Admission medications   Medication Sig Start Date End Date Taking? Authorizing Provider  amLODipine (NORVASC) 10 MG tablet Take 1 tablet (10 mg total) by mouth daily. Patient taking differently: Take 10 mg by mouth every morning. 05/16/20   Angiulli, Lavon Paganini, PA-C  aspirin EC 81 MG tablet Take 81 mg by mouth every morning. Swallow whole.    [provider]  atorvastatin (LIPITOR) 40 MG tablet Take 1 tablet (40 mg total) by mouth daily. Patient taking differently: Take 40 mg by mouth daily at 6 PM. 05/15/20   Angiulli, Lavon Paganini, PA-C  bacitracin ointment Apply 1 application topically 2 (two) times daily. 05/12/21   Couture, Cortni S, PA-C  bisacodyl (DULCOLAX) 10 MG suppository Place 10 mg rectally every morning.    [provider]  carvedilol (COREG)  25 MG tablet Take 1 tablet (25 mg total) by mouth 2 (two) times daily with a meal. Patient taking differently: Take 25 mg by mouth 2 (two) times daily. 05/15/20   Angiulli, Lavon Paganini, PA-C  chlorthalidone (HYGROTON) 50 MG tablet Take 50 mg by mouth every morning.    [provider]  clopidogrel (PLAVIX) 75 MG tablet Take 1 tablet (75 mg total) by mouth daily. Patient taking differently: Take 75 mg by mouth every morning. 05/15/20   Angiulli, Lavon Paganini, PA-C  docusate sodium (COLACE) 100 MG capsule Take 100 mg by mouth 2 (two) times daily.    [provider]  FLUoxetine (PROZAC) 10 MG capsule Take 1 capsule (10 mg total) by mouth daily. Patient taking differently: Take 10 mg by mouth every morning. 05/15/20   Angiulli, Lavon Paganini, PA-C  losartan (COZAAR) 25 MG tablet Take 75 mg by mouth every morning.    [provider]  metFORMIN  (GLUCOPHAGE) 500 MG tablet Take 1 tablet (500 mg total) by mouth 2 (two) times daily with a meal. Patient taking differently: Take 500 mg by mouth daily with breakfast. 05/15/20   Angiulli, Lavon Paganini, PA-C  ondansetron (ZOFRAN-ODT) 4 MG disintegrating tablet Take 4 mg by mouth every 6 (six) hours.    [provider]  paliperidone (INVEGA) 6 MG 24 hr tablet Take 6 mg by mouth every morning.    [provider]  pantoprazole (PROTONIX) 40 MG tablet Take 1 tablet (40 mg total) by mouth 2 (two) times daily before a meal. 05/14/21 05/14/22  Gherghe, Vella Redhead, MD  polyethylene glycol (MIRALAX / GLYCOLAX) 17 g packet Take 17 g by mouth 2 (two) times daily. Mix in 8 oz liquid and drink    [provider]  senna (SENOKOT) 8.6 MG TABS tablet Take 2 tablets by mouth at bedtime.    [provider]    ___________________________________________________________________________________________________ Physical Exam: Vitals with BMI 05/15/2021 05/15/2021 05/15/2021  Height - - -  Weight - - -  BMI - - -  Systolic 0000000 [id number]  Diastolic 93 93 92  Pulse 73 91 74     1. General:  in No  Acute distress   Chronically ill -appearing 2. Psychological: Somnolent after getting Ativan oriented to self 3. Head/ENT:   Dry Mucous Membranes                          Head Non traumatic, neck supple                        Poor Dentition 4. SKIN: decreased Skin turgor,  Skin clean Dry and intact no rash 5. Heart: Regular rate and rhythm no Murmur, no Rub or gallop 6. Lungs: , no wheezes or crackles   7. Abdomen: Soft,  non-tender, Non distended bowel sounds present 8. Lower extremities: no clubbing, cyanosis, no  edema 9. Neurologically Grossly intact, moving all 4 extremities equally  10. MSK: Normal range of motion    Chart has been reviewed  ______________________________________________________________________________________________  Assessment/Plan 60 y.o. 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  Will observe  in progressive level. Continue bowel regimen if nausea and vomiting does not improve may need GI consult May need gastric transit study Avoid narcotics Recent CT abdomen was not unremarkable CT head unremarkable as well Given history of diabetes we will obtain troponins EKG without changes patient not endorsing chest pain   Schizophrenia ([place]) chronic continue home medications   Hypokalemia - - will replace and repeat in AM,  check magnesium level and replace as needed   Hyperlipidemia associated with type 2 diabetes mellitus ([place]) continue statin    Chronic combined systolic (congestive) and diastolic (congestive) heart failure (H[facility]) chronic currently appears to be on a dry side hold diuretics   Benign essential HTN - resume home meds including coreg, hold ARB for tonight given dehydration  Past history of CVA resume Plavix  DM2 -  - Order Sensitive SSI    -  check TSH and HgA1C  - Hold by mouth medications    Other plan as per orders.  DVT prophylaxis:  SCD     Code Status:    Code Status: Prior FULL CODE  as per patient  and paperwork I had personally discussed CODE STATUS with patient    Family Communication:   Family not at  Bedside    Disposition Plan:                             Back to current facility when stable         Following barriers for discharge:                            Electrolytes corrected                                                            Will need to be able to tolerate PO        Would benefit from PT/OT eval prior to DC  Ordered                    Swallow eval - SLP ordered                   Diabetes care coordinator                   Transition of care consulted                   Nutrition    consulted                                      Palliative care    consulted                Consults called: none  Admission status:  ED Disposition     ED Disposition  Admit   Condition  --   [place]: [place] [[id number]]  Level of Care: Telemetry Medical [[id number]]  May place patient in observation at [facility] or [facility] if equivalent level of care is available:: No  Covid Evaluation: Asymptomatic Screening Protocol (No Symptoms)  Diagnosis: Intractable nausea and  vomiting [id number]  Admitting Physician: Toy Baker [[id number]]  Attending Physician: Toy Baker [[id number]]           Obs     Level of care    tele  For  24H       Lab Results  Component Value Date   [place] 05/13/2021     Precautions: admitted as  Covid Negative     PPE: Used by the provider:   N95  eye Goggles,  Gloves     Raven Furnas 05/15/2021, 11:19 PM    [facility]     after 2 AM please page floor coverage PA If 7AM-7PM, please contact the day team taking care of the patient using [email or web address]   Patient was evaluated in the context of the global COVID-19 pandemic, which necessitated consideration that the patient might be at risk for infection with the SARS-CoV-2 virus that causes COVID-19. Institutional protocols and algorithms that pertain to the evaluation of patients at risk for COVID-19 are in a state of rapid change based on information released by regulatory bodies including the CDC and federal and state organizations. These policies and algorithms were followed during the patient's care.

## 2021-05-16 DIAGNOSIS — I69391 Dysphagia following cerebral infarction: Secondary | ICD-10-CM | POA: Diagnosis not present

## 2021-05-16 DIAGNOSIS — M109 Gout, unspecified: Secondary | ICD-10-CM | POA: Diagnosis present

## 2021-05-16 DIAGNOSIS — N179 Acute kidney failure, unspecified: Secondary | ICD-10-CM | POA: Diagnosis not present

## 2021-05-16 DIAGNOSIS — Z515 Encounter for palliative care: Secondary | ICD-10-CM | POA: Diagnosis not present

## 2021-05-16 DIAGNOSIS — F1011 Alcohol abuse, in remission: Secondary | ICD-10-CM | POA: Diagnosis present

## 2021-05-16 DIAGNOSIS — F209 Schizophrenia, unspecified: Secondary | ICD-10-CM | POA: Diagnosis present

## 2021-05-16 DIAGNOSIS — Z7982 Long term (current) use of aspirin: Secondary | ICD-10-CM | POA: Diagnosis not present

## 2021-05-16 DIAGNOSIS — R6339 Other feeding difficulties: Secondary | ICD-10-CM | POA: Diagnosis present

## 2021-05-16 DIAGNOSIS — Z7189 Other specified counseling: Secondary | ICD-10-CM | POA: Diagnosis not present

## 2021-05-16 DIAGNOSIS — Z79899 Other long term (current) drug therapy: Secondary | ICD-10-CM | POA: Diagnosis not present

## 2021-05-16 DIAGNOSIS — E876 Hypokalemia: Secondary | ICD-10-CM | POA: Diagnosis present

## 2021-05-16 DIAGNOSIS — Z20822 Contact with and (suspected) exposure to covid-19: Secondary | ICD-10-CM | POA: Diagnosis present

## 2021-05-16 DIAGNOSIS — R112 Nausea with vomiting, unspecified: Secondary | ICD-10-CM | POA: Diagnosis not present

## 2021-05-16 DIAGNOSIS — E785 Hyperlipidemia, unspecified: Secondary | ICD-10-CM | POA: Diagnosis present

## 2021-05-16 DIAGNOSIS — Z7984 Long term (current) use of oral hypoglycemic drugs: Secondary | ICD-10-CM | POA: Diagnosis not present

## 2021-05-16 DIAGNOSIS — I2510 Atherosclerotic heart disease of native coronary artery without angina pectoris: Secondary | ICD-10-CM | POA: Diagnosis present

## 2021-05-16 DIAGNOSIS — E43 Unspecified severe protein-calorie malnutrition: Secondary | ICD-10-CM | POA: Diagnosis present

## 2021-05-16 DIAGNOSIS — R471 Dysarthria and anarthria: Secondary | ICD-10-CM | POA: Diagnosis present

## 2021-05-16 DIAGNOSIS — E860 Dehydration: Secondary | ICD-10-CM | POA: Diagnosis present

## 2021-05-16 DIAGNOSIS — M1990 Unspecified osteoarthritis, unspecified site: Secondary | ICD-10-CM | POA: Diagnosis present

## 2021-05-16 DIAGNOSIS — F1411 Cocaine abuse, in remission: Secondary | ICD-10-CM | POA: Diagnosis present

## 2021-05-16 DIAGNOSIS — I69351 Hemiplegia and hemiparesis following cerebral infarction affecting right dominant side: Secondary | ICD-10-CM | POA: Diagnosis not present

## 2021-05-16 DIAGNOSIS — I5042 Chronic combined systolic (congestive) and diastolic (congestive) heart failure: Secondary | ICD-10-CM | POA: Diagnosis present

## 2021-05-16 DIAGNOSIS — R1312 Dysphagia, oropharyngeal phase: Secondary | ICD-10-CM | POA: Diagnosis present

## 2021-05-16 DIAGNOSIS — F17210 Nicotine dependence, cigarettes, uncomplicated: Secondary | ICD-10-CM | POA: Diagnosis present

## 2021-05-16 DIAGNOSIS — I10 Essential (primary) hypertension: Secondary | ICD-10-CM | POA: Diagnosis not present

## 2021-05-16 DIAGNOSIS — E1169 Type 2 diabetes mellitus with other specified complication: Secondary | ICD-10-CM | POA: Diagnosis present

## 2021-05-16 DIAGNOSIS — F32A Depression, unspecified: Secondary | ICD-10-CM | POA: Diagnosis present

## 2021-05-16 LAB — CBC WITH DIFFERENTIAL/PLATELET
Abs Immature Granulocytes: 0.02 10*3/uL (ref 0.00–0.07)
Basophils Absolute: 0 10*3/uL (ref 0.0–0.1)
Basophils Relative: 1 %
Eosinophils Absolute: 0.2 10*3/uL (ref 0.0–0.5)
Eosinophils Relative: 3 %
HCT: 37.7 % — ABNORMAL LOW (ref 39.0–52.0)
Hemoglobin: 12.1 g/dL — ABNORMAL LOW (ref 13.0–17.0)
Immature Granulocytes: 0 %
Lymphocytes Relative: 27 %
Lymphs Abs: 2.2 10*3/uL (ref 0.7–4.0)
MCH: 27.3 pg (ref 26.0–34.0)
MCHC: 32.1 g/dL (ref 30.0–36.0)
MCV: 85.1 fL (ref 80.0–100.0)
Monocytes Absolute: 0.8 10*3/uL (ref 0.1–1.0)
Monocytes Relative: 10 %
Neutro Abs: 4.8 10*3/uL (ref 1.7–7.7)
Neutrophils Relative %: 59 %
Platelets: 192 10*3/uL (ref 150–400)
RBC: 4.43 MIL/uL (ref 4.22–5.81)
RDW: 13.1 % (ref 11.5–15.5)
WBC: 8 10*3/uL (ref 4.0–10.5)
nRBC: 0 % (ref 0.0–0.2)

## 2021-05-16 LAB — SARS CORONAVIRUS 2 (TAT 6-24 HRS): SARS Coronavirus 2: NEGATIVE

## 2021-05-16 LAB — URINALYSIS, ROUTINE W REFLEX MICROSCOPIC
Bacteria, UA: NONE SEEN
Bilirubin Urine: NEGATIVE
Glucose, UA: NEGATIVE mg/dL
Hgb urine dipstick: NEGATIVE
Ketones, ur: 20 mg/dL — AB
Leukocytes,Ua: NEGATIVE
Nitrite: NEGATIVE
Protein, ur: 100 mg/dL — AB
Specific Gravity, Urine: 1.019 (ref 1.005–1.030)
pH: 5 (ref 5.0–8.0)

## 2021-05-16 LAB — COMPREHENSIVE METABOLIC PANEL
ALT: 7 U/L (ref 0–44)
AST: 14 U/L — ABNORMAL LOW (ref 15–41)
Albumin: 3.9 g/dL (ref 3.5–5.0)
Alkaline Phosphatase: 71 U/L (ref 38–126)
Anion gap: 8 (ref 5–15)
BUN: 13 mg/dL (ref 6–20)
CO2: 29 mmol/L (ref 22–32)
Calcium: 8.9 mg/dL (ref 8.9–10.3)
Chloride: 101 mmol/L (ref 98–111)
Creatinine, Ser: 1.2 mg/dL (ref 0.61–1.24)
GFR, Estimated: >60 mL/min (ref >60)
Glucose, Bld: 107 mg/dL — ABNORMAL HIGH (ref 70–99)
Potassium: 3.1 mmol/L — ABNORMAL LOW (ref 3.5–5.1)
Sodium: 138 mmol/L (ref 135–145)
Total Bilirubin: 0.8 mg/dL (ref 0.3–1.2)
Total Protein: 7 g/dL (ref 6.5–8.1)

## 2021-05-16 LAB — BASIC METABOLIC PANEL
Anion gap: 8 (ref 5–15)
BUN: 11 mg/dL (ref 6–20)
CO2: 28 mmol/L (ref 22–32)
Calcium: 8.9 mg/dL (ref 8.9–10.3)
Chloride: 102 mmol/L (ref 98–111)
Creatinine, Ser: 1.18 mg/dL (ref 0.61–1.24)
GFR, Estimated: >60 mL/min (ref >60)
Glucose, Bld: 107 mg/dL — ABNORMAL HIGH (ref 70–99)
Potassium: 3.1 mmol/L — ABNORMAL LOW (ref 3.5–5.1)
Sodium: 138 mmol/L (ref 135–145)

## 2021-05-16 LAB — ETHANOL: Alcohol, Ethyl (B): <10 mg/dL (ref <10)

## 2021-05-16 LAB — TSH: TSH: 2.868 u[IU]/mL (ref 0.350–4.500)

## 2021-05-16 LAB — GLUCOSE, CAPILLARY
Glucose-Capillary: 112 mg/dL — ABNORMAL HIGH (ref 70–99)
Glucose-Capillary: 102 mg/dL — ABNORMAL HIGH (ref 70–99)
Glucose-Capillary: 107 mg/dL — ABNORMAL HIGH (ref 70–99)
Glucose-Capillary: 113 mg/dL — ABNORMAL HIGH (ref 70–99)
Glucose-Capillary: 107 mg/dL — ABNORMAL HIGH (ref 70–99)
Glucose-Capillary: 123 mg/dL — ABNORMAL HIGH (ref 70–99)

## 2021-05-16 LAB — CK: Total CK: 110 U/L (ref 49–397)

## 2021-05-16 LAB — LACTIC ACID, PLASMA: Lactic Acid, Venous: 0.9 mmol/L (ref 0.5–1.9)

## 2021-05-16 LAB — PHOSPHORUS
Phosphorus: 3 mg/dL (ref 2.5–4.6)
Phosphorus: 2.7 mg/dL (ref 2.5–4.6)

## 2021-05-16 LAB — MAGNESIUM
Magnesium: 1.7 mg/dL (ref 1.7–2.4)
Magnesium: 1.6 mg/dL — ABNORMAL LOW (ref 1.7–2.4)

## 2021-05-16 LAB — MRSA NEXT GEN BY PCR, NASAL: MRSA by PCR Next Gen: NOT DETECTED

## 2021-05-16 LAB — TROPONIN I (HIGH SENSITIVITY): Troponin I (High Sensitivity): 12 ng/L (ref <18)

## 2021-05-16 MED ORDER — ADULT MULTIVITAMIN W/MINERALS CH
1.0000 | ORAL_TABLET | Freq: Every day | ORAL | Status: DC
  Administered 2021-05-16 – 2021-05-23 (×8): 1 via ORAL
  Filled 2021-05-16 (×8): qty 1

## 2021-05-16 MED ORDER — PALIPERIDONE ER 6 MG PO TB24
6.0000 mg | ORAL_TABLET | Freq: Every morning | ORAL | Status: DC
  Administered 2021-05-16 – 2021-06-03 (×14): 6 mg via ORAL
  Filled 2021-05-16 (×19): qty 1

## 2021-05-16 MED ORDER — HYDROCODONE-ACETAMINOPHEN 5-325 MG PO TABS: 1.0000 | ORAL_TABLET | ORAL | Status: DC | PRN

## 2021-05-16 MED ORDER — SENNA 8.6 MG PO TABS
2.0000 | ORAL_TABLET | Freq: Every day | ORAL | Status: DC
  Administered 2021-05-16 – 2021-05-21 (×5): 17.2 mg via ORAL
  Filled 2021-05-16 (×7): qty 2

## 2021-05-16 MED ORDER — PANTOPRAZOLE SODIUM 40 MG IV SOLR
40.0000 mg | Freq: Every day | INTRAVENOUS | Status: DC
  Administered 2021-05-16 – 2021-05-22 (×8): 40 mg via INTRAVENOUS
  Filled 2021-05-16 (×8): qty 40

## 2021-05-16 MED ORDER — ACETAMINOPHEN 325 MG PO TABS
650.0000 mg | ORAL_TABLET | Freq: Four times a day (QID) | ORAL | Status: DC | PRN
  Administered 2021-05-16 – 2021-05-23 (×4): 650 mg via ORAL
  Filled 2021-05-16 (×3): qty 2

## 2021-05-16 MED ORDER — POTASSIUM CHLORIDE CRYS ER 20 MEQ PO TBCR
20.0000 meq | EXTENDED_RELEASE_TABLET | Freq: Two times a day (BID) | ORAL | Status: DC
  Administered 2021-05-16: 20 meq via ORAL
  Filled 2021-05-16: qty 1

## 2021-05-16 MED ORDER — HYDRALAZINE HCL 20 MG/ML IJ SOLN
5.0000 mg | Freq: Three times a day (TID) | INTRAMUSCULAR | Status: DC | PRN
  Administered 2021-05-16: 5 mg via INTRAVENOUS
  Filled 2021-05-16: qty 1

## 2021-05-16 MED ORDER — AMLODIPINE BESYLATE 10 MG PO TABS
10.0000 mg | ORAL_TABLET | Freq: Every morning | ORAL | Status: DC
  Administered 2021-05-16 – 2021-05-23 (×8): 10 mg via ORAL
  Filled 2021-05-16 (×8): qty 1

## 2021-05-16 MED ORDER — ACETAMINOPHEN 650 MG RE SUPP: 650.0000 mg | Freq: Four times a day (QID) | RECTAL | Status: DC | PRN

## 2021-05-16 MED ORDER — ENSURE ENLIVE PO LIQD
237.0000 mL | Freq: Three times a day (TID) | ORAL | Status: DC
  Administered 2021-05-16 – 2021-05-23 (×7): 237 mL via ORAL
  Filled 2021-05-16: qty 237

## 2021-05-16 MED ORDER — KCL IN DEXTROSE-NACL 40-5-0.45 MEQ/L-%-% IV SOLN
INTRAVENOUS | Status: AC
  Filled 2021-05-16: qty 1000

## 2021-05-16 MED ORDER — SODIUM CHLORIDE 0.9 % IV SOLN
75.0000 mL/h | INTRAVENOUS | Status: AC
  Administered 2021-05-16: 75 mL/h via INTRAVENOUS

## 2021-05-16 MED ORDER — ONDANSETRON HCL 4 MG/2ML IJ SOLN
4.0000 mg | Freq: Four times a day (QID) | INTRAMUSCULAR | Status: DC | PRN
  Administered 2021-05-16 – 2021-05-29 (×7): 4 mg via INTRAVENOUS
  Filled 2021-05-16 (×7): qty 2

## 2021-05-16 MED ORDER — POLYETHYLENE GLYCOL 3350 17 G PO PACK
17.0000 g | PACK | Freq: Two times a day (BID) | ORAL | Status: DC
  Administered 2021-05-16 – 2021-05-23 (×13): 17 g via ORAL
  Filled 2021-05-16 (×14): qty 1

## 2021-05-16 MED ORDER — FLUOXETINE HCL 10 MG PO CAPS
10.0000 mg | ORAL_CAPSULE | Freq: Every day | ORAL | Status: DC
  Administered 2021-05-16 – 2021-05-23 (×8): 10 mg via ORAL
  Filled 2021-05-16 (×8): qty 1

## 2021-05-16 MED ORDER — CLOPIDOGREL BISULFATE 75 MG PO TABS
75.0000 mg | ORAL_TABLET | Freq: Every morning | ORAL | Status: DC
  Administered 2021-05-16 – 2021-05-22 (×7): 75 mg via ORAL
  Filled 2021-05-16 (×8): qty 1

## 2021-05-16 MED ORDER — ATORVASTATIN CALCIUM 40 MG PO TABS
40.0000 mg | ORAL_TABLET | Freq: Every day | ORAL | Status: DC
  Administered 2021-05-17 – 2021-05-22 (×5): 40 mg via ORAL
  Filled 2021-05-16 (×6): qty 1

## 2021-05-16 MED ORDER — MAGNESIUM SULFATE 2 GM/50ML IV SOLN
2.0000 g | Freq: Once | INTRAVENOUS | Status: AC
  Administered 2021-05-16: 2 g via INTRAVENOUS
  Filled 2021-05-16: qty 50

## 2021-05-16 MED ORDER — BISACODYL 10 MG RE SUPP: 10.0000 mg | RECTAL | Status: DC

## 2021-05-16 MED ORDER — HYDRALAZINE HCL 20 MG/ML IJ SOLN
10.0000 mg | Freq: Four times a day (QID) | INTRAMUSCULAR | Status: DC | PRN
  Administered 2021-05-16 – 2021-06-02 (×10): 10 mg via INTRAVENOUS
  Filled 2021-05-16 (×11): qty 1

## 2021-05-16 MED ORDER — ONDANSETRON HCL 4 MG PO TABS: 4.0000 mg | ORAL_TABLET | Freq: Four times a day (QID) | ORAL | Status: DC | PRN

## 2021-05-16 MED ORDER — SODIUM CHLORIDE 0.9 % IV SOLN
6.2500 mg | Freq: Three times a day (TID) | INTRAVENOUS | Status: DC | PRN
  Filled 2021-05-16: qty 0.25

## 2021-05-16 MED ORDER — METOCLOPRAMIDE HCL 5 MG/ML IJ SOLN
10.0000 mg | Freq: Three times a day (TID) | INTRAMUSCULAR | Status: DC
  Administered 2021-05-16 – 2021-05-23 (×21): 10 mg via INTRAVENOUS
  Filled 2021-05-16 (×21): qty 2

## 2021-05-16 MED ORDER — BACLOFEN 10 MG PO TABS
10.0000 mg | ORAL_TABLET | Freq: Once | ORAL | Status: AC
  Administered 2021-05-16: 10 mg via ORAL
  Filled 2021-05-16: qty 1

## 2021-05-16 MED ORDER — CARVEDILOL 25 MG PO TABS
25.0000 mg | ORAL_TABLET | Freq: Two times a day (BID) | ORAL | Status: DC
  Administered 2021-05-16 – 2021-05-23 (×13): 25 mg via ORAL
  Filled 2021-05-16 (×14): qty 1

## 2021-05-16 NOTE — Plan of Care (Signed)
  Problem: Activity: Goal: Risk for activity intolerance will decrease Outcome: Progressing   Problem: Nutrition: Goal: Adequate nutrition will be maintained Outcome: Progressing   Problem: Coping: Goal: Level of anxiety will decrease Outcome: Progressing   Problem: Elimination: Goal: Will not experience complications related to bowel motility Outcome: Progressing   

## 2021-05-16 NOTE — Progress Notes (Signed)
OT Cancellation Note  Patient Details Name: [name] MRN: [id number] DOB: 01/01/1961   Cancelled Treatment:    Reason Eval/Treat Not Completed: Medical issues which prohibited therapy; patient with BP of 183/119 at 0724. RN to administer meds at this time with OT to check back.   Gloris Manchester OTR/L Supplemental OT, Department of rehab services [phone]  Lasharn Bufkin R H. 05/16/2021, 7:44 AM

## 2021-05-16 NOTE — Consult Note (Addendum)
Palliative Medicine Inpatient Consult Note  Reason for consult:  Goals of Care  HPI:  Per intake H&P --> 60 y/o male with prior hx of EtOH abuse, cocaine abuse, DM2, malignant HTN, schizophrenia, chronic combined CHF, multiple prior strokes with persistent dysarthria and SNF residence at this time he was most recently seen in the hospital and discharged on 05/14/2021 after having persistent nausea vomiting and constipation.  Palliative care was consulted for goals of care conversations in the setting of two recent inpatient admissions and multiple co-morbidities.   Clinical Assessment/Goals of Care:  *Please note that this is a verbal dictation therefore any spelling or grammatical errors are due to the "[place]" system interpretation.  I have reviewed medical records including EPIC notes, labs and imaging, received report from bedside RN, assessed the patient who is aware of self though disoriented to place & situation.     I called Rande's daughter, Katharine Look (guardian) to further discuss diagnosis prognosis, [place], EOL wishes, disposition and options.   I introduced Palliative Medicine as specialized medical care for people living with serious illness. It focuses on providing relief from the symptoms and stress of a serious illness. The goal is to improve quality of life for both the patient and the family.  Quirino is from [place], [place]. He is married though legally separated. He has eight children. 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 He is religious and practices within [place].  Prior to admission Gerald Stabs was at [facility] as a long term  custodial resident. Per conversation with Suanne Marker, staff nurse he was able to be gotten out of bed with a hoyer lift. He has profound paralysis on the right side.   Per Vanita Panda was able to "eat on his own" and help with mobility from what  she understood. They did use a hoyer lift and Gerald Stabs needs complete assistance with toileting.   A detailed discussion was had today regarding advanced directives - Katharine Look shares that she will send these to [place].   Concepts specific to code status, artifical feeding and hydration, continued IV antibiotics and rehospitalization was had.  A MOST form was provided to family though his daughter was very clear about full code/full scope of care. She shares that her "daddy would want everything done to keep him alive."  Katharine Look shares that "we need to figure out what's wrong" as her father although debilitated was functioning "just fine" prior to hospitalization. We reviewed his co-morbidities inclusive of heart failure, CAD, and recurrent strokes. Discussed that in the long run these conditions are worrisome. She understood this but shares, "my daddy is all I have." She was very tearful and her husband got on the phone and requested we call back later in the morning.  Discussed the importance of continued conversation with family and their  medical providers regarding overall plan of care and treatment options, ensuring decisions are within the context of the patients values and GOCs. _______________________________________________________________ Addendum:  I was able to speak to patients daughter in the late morning. I was able to clarify that the with Katharine Look that I am present as a palliative care provider to help patients and their families navigate chronic illness and serious medical disease.  I shared that we are here as an extra blanket of support for the primary team.  We discussed that at this point in time Gerald Stabs is stable though additional work-up  is pending.  I further went on to share that he does have comorbid conditions which are worrisome especially in the setting of aggressive interventions and measures.  Katharine Look is clear that she would want to give Christiaan every opportunity to improve as this  is respecting and honoring his wishes as per their prior conversations.  Katharine Look was more relaxed on the phone after my endorsing that Gerald Stabs is stable. She shared again that her father is "the only person" in her life that she has left and indicates she does not want to lose him.  I assured her that he is still with [place] and we will continue to help manage symptoms such as nausea.  I reviewed that Dr. Avon Gully will reach out to [facility] later today to provide a more comprehensive medical update.  Decision Maker: [name] (daughter, Guardian) [phone]  SUMMARY OF RECOMMENDATIONS   Full Code / Full Scope of Care  Have emailed patient's daughter, Katharine Look a copy of the "hard choices for loving people" book and a MOST form  Obtain a copy of legal guardian paperwork  Appreciate hospitalist providing daughter a medical update  Ongoing incremental palliative team support  Code Status/Advance Care Planning: FULL CODE   Symptom Management:  Intractable nausea and vomiting - - Speech eval - Nutrition eval - S/P Ativan and Haldol  - Order IV reglan ATC - Continue bowel regimen - Consider GI consult   Palliative Prophylaxis:  Oral care, mobility  Additional Recommendations (Limitations, Scope, Preferences): Continue current scope of care  Psycho-social/Spiritual:  Desire for further Chaplaincy support: No Additional Recommendations: Education on coronary artery disease and recurrent strokes.   Prognosis: Unclear at this time.  Discharge Planning: Charge will be back to [facility] when medically optimized.  Vitals:   05/16/21 0000 05/16/21 0119  BP: (!) 126/92 (!) 181/99  Pulse: (!) 59 72  Resp:  19  Temp:  98.3 F (36.8 C)  SpO2: 98% 100%    Intake/Output Summary (Last 24 hours) at 05/16/2021 0653 Last data filed at 05/16/2021 0531 Gross per 24 hour  Intake 886.32 ml  Output --  Net 886.32 ml   Last Weight  Most recent update: 05/15/2021  2:21 PM    Weight   79.4 kg (175 lb)            Gen: Middle-aged African-American man in no acute distress HEENT: moist mucous membranes CV: Regular rate and rhythm PULM: On room air ABD: soft/nontender EXT: No edema Neuro: Alert to self, disoriented to place and situation  PPS: 20%   This conversation/these recommendations were discussed with patient primary care team, Dr. Avon Gully  Time In: 0830 Time Out: 1000 Total Time: 90 Greater than 50%  of this time was spent counseling and coordinating care related to the above assessment and plan.  [place] Team Team Cell Phone: [phone] Please utilize secure chat with additional questions, if there is no response within 30 minutes please call the above phone number  Palliative Medicine Team providers are available by phone from 7am to 7pm daily and can be reached through the team cell phone.  Should this patient require assistance outside of these hours, please call the patient's attending physician.

## 2021-05-16 NOTE — Progress Notes (Signed)
Initial Nutrition Assessment  DOCUMENTATION CODES:   Not applicable  INTERVENTION:  -Ensure Enlive po TID, each supplement provides 350 kcal and 20 grams of protein -MVI with minerals daily  NUTRITION DIAGNOSIS:   Inadequate oral intake related to nausea, vomiting as evidenced by per patient/family report.  GOAL:   Patient will meet greater than or equal to 90% of their needs  MONITOR:   PO intake, Supplement acceptance, Weight trends, Labs, I & O's  REASON FOR ASSESSMENT:   Consult Assessment of nutrition requirement/status  ASSESSMENT:   Pt with a PMH significant of  EtOH abuse, cocaine abuse, DM2, malignant HTN, schizophrenia, chronic combined CHF, multiple prior strokes with persistent dysarthria and residual R sided weakness admitted for intractable N/V and hypokalemia.  Pt unavailable at time of RD visit, working with therapy. Per H&P, pt reports ongoing nausea and vomiting and was recently admitted on 05/14/21 with the same complaints. At that time pt had coffee-ground emesis felt to be 2/2 Mallory-Weiss tear and GI recommended holding off on EGD. Pt denies abdominal pain. Per MD, plan at this time is to continue bowel regimen and consult GI if N/V does not improve. Pt may need gastric transit study.   Medications: SSI, reglan, protonix, miralax, klor-con, senokot  Labs: K+ 3.1 (L), Mg 1.6 (L) CBGs 112-102  NUTRITION - FOCUSED PHYSICAL EXAM: Unable to perform at this time. Will attempt at follow-up.   Diet Order:   Diet Order             DIET DYS 3 Room service appropriate? Yes; Fluid consistency: Thin  Diet effective now                   EDUCATION NEEDS:   No education needs have been identified at this time  Skin:  Skin Assessment: Reviewed RN Assessment  Last BM:  PTA  Height:   Ht Readings from Last 1 Encounters:  05/15/21 '5\' 11"'$  (1.803 m)    Weight:   Wt Readings from Last 1 Encounters:  05/15/21 79.4 kg    BMI:  Body mass index is  24.41 kg/m.  Estimated Nutritional Needs:   Kcal:  2400-2600  Protein:  120-130g  Fluid:  >2L    Larkin Ina, MS, RD, LDN (she/her/hers) RD pager number and weekend/on-call pager number located in [place].

## 2021-05-16 NOTE — Evaluation (Signed)
Physical Therapy Evaluation Patient Details Name: [name] MRN: [id number] DOB: 24-Jun-1961 Today's Date: 05/16/2021   History of Present Illness  60 y.o. male presenting to ED 6/28 from [place] with intractable N/V. Patient with recent admission 05/14/2021 with similar complaint. GI consult not recommended at that time. PMHx significant for multiple CVA's with residual R hemiparesis, dysarthria and neurocognitive impairment, CHF, DMII, HTN, schizoaffective disorder, chronic L hip pain and polysubstance abuse.  Clinical Impression   Pt presents with significant debility, weakness R>L secondary to chronic CVAs/debility, severe difficulty performing mobility tasks, poor to zero sitting balance, and decreased activity tolerance. Pt to benefit from acute PT to address deficits. Pt requiring total assist +1-2 for bed mobility and clean up due to pt significantly soiled in urine, pt sat EOB <20 seconds stating "I don't do this, I stay in the bed" and required max assist to maintain upright. Pt is at high risk for skin breakdown given little to no mobility and urinary incontinence. Pt also with very poor oral secretion management during session. Pt states he is bed level at baseline. Recommending return to SNF level of care. PT to progress mobility as tolerated, and will continue to follow acutely.      Follow Up Recommendations SNF    Equipment Recommendations  Other (comment) (defer)    Recommendations for Other Services       Precautions / Restrictions Precautions Precautions: Fall Precaution Comments: R hemiparesis, significant debility, risk of skin breakdown Restrictions Weight Bearing Restrictions: No      Mobility  Bed Mobility Overal bed mobility: Needs Assistance Bed Mobility: Supine to Sit;Sit to Supine;Rolling Rolling: Total assist   Supine to sit: Total assist Sit to supine: Total assist   General bed mobility comments: total assist for all aspects, pt  reaching for bedrails when cued but <25% of effort is performed by pt. Pt with motor control over legs during initial exam, during bed mobility pt lets LLE uncontrollably go over EOB with no attempt to correct. tolerated EOB sitting ~15 seconds with heavy R lateral leaning, states "I don't do this, I don't get out of bed"    Transfers                 General transfer comment: nt - would need lift equipment  Ambulation/Gait                Stairs            Wheelchair Mobility    Modified Rankin (Stroke Patients Only) Modified Rankin (Stroke Patients Only) Pre-Morbid Rankin Score: Severe disability Modified Rankin: Severe disability     Balance Overall balance assessment: Needs assistance Sitting-balance support: Feet supported Sitting balance-Leahy Scale: Zero Sitting balance - Comments: max assist to sit upright, tolerance <30 seconds Postural control: Right lateral lean     Standing balance comment: nt                             Pertinent Vitals/Pain Pain Assessment: Faces Faces Pain Scale: Hurts little more Pain Location: all over, generalized with mobility Pain Descriptors / Indicators: Discomfort;Aching;Sore Pain Intervention(s): Limited activity within patient's tolerance;Monitored during session;Repositioned    Home Living Family/patient expects to be discharged to:: Skilled nursing facility                      Prior Function Level of Independence: Needs assistance   Gait / Transfers Assistance Needed: pt  reports being bed-level  ADL's / Homemaking Assistance Needed: total care        Hand Dominance   Dominant Hand: Right (pt states R handed today, previous notes state L handed)    Extremity/Trunk Assessment   Upper Extremity Assessment Upper Extremity Assessment: Defer to OT evaluation    Lower Extremity Assessment Lower Extremity Assessment: Generalized weakness;RLE deficits/detail;LLE deficits/detail RLE  Deficits / Details: 2/5 knee flex, ext; trace DF/PF - history of CVA. Windswept appearance LEs to R LLE Deficits / Details: 2+/5 knee flex/ext, trace DF/PF    Cervical / Trunk Assessment Cervical / Trunk Assessment: Other exceptions Cervical / Trunk Exceptions: posterior and R lateral leaning  Communication   Communication: Expressive difficulties  Cognition Arousal/Alertness: Awake/alert Behavior During Therapy: Flat affect Overall Cognitive Status: Impaired/Different from baseline Area of Impairment: Attention;Following commands;Safety/judgement;Problem solving                   Current Attention Level: Sustained   Following Commands: Follows one step commands with increased time Safety/Judgement: Decreased awareness of safety;Decreased awareness of deficits   Problem Solving: Slow processing;Difficulty sequencing;Requires verbal cues;Requires tactile cues General Comments: Pt with slowed processing, difficulty following one-step commands at times requiring multimodal cuing. pt lacks insight into deficits, states he wants to go home with sister but is completely total care and does not have 24/7 assist.      General Comments General comments (skin integrity, edema, etc.): pt soiled in urine, requiring total care for clean up. no sacral pad donned, NT notified and pt is high risk for skin breakdown. PT inspected pt feet and heels, callus formation along heels and infection presentation on great and second toe on R foot, PT floated pt heels with pillow for skin integrity. Very poor seretion management during session    Exercises     Assessment/Plan    PT Assessment Patient needs continued PT services  PT Problem List Decreased strength;Decreased mobility;Decreased activity tolerance;Decreased balance;Decreased knowledge of use of DME;Pain;Decreased cognition;Decreased coordination;Decreased range of motion;Decreased safety awareness       PT Treatment Interventions DME  instruction;Therapeutic activities;Therapeutic exercise;Patient/family education;Balance training;Functional mobility training;Neuromuscular re-education    PT Goals (Current goals can be found in the Care Plan section)  Acute Rehab PT Goals Patient Stated Goal: go home with sister PT Goal Formulation: With patient Time For Goal Achievement: 05/30/21 Potential to Achieve Goals: Poor    Frequency Min 2X/week   Barriers to discharge        Co-evaluation               AM-PAC PT "6 Clicks" Mobility  Outcome Measure Help needed turning from your back to your side while in a flat bed without using bedrails?: Total Help needed moving from lying on your back to sitting on the side of a flat bed without using bedrails?: Total Help needed moving to and from a bed to a chair (including a wheelchair)?: Total Help needed standing up from a chair using your arms (e.g., wheelchair or bedside chair)?: Total Help needed to walk in hospital room?: Total Help needed climbing 3-5 steps with a railing? : Total 6 Click Score: 6    End of Session   Activity Tolerance: Patient limited by fatigue Patient left: in bed;with bed alarm set;with call bell/phone within reach Nurse Communication: Mobility status PT Visit Diagnosis: Other abnormalities of gait and mobility (R26.89);Muscle weakness (generalized) (M62.81)    Time: [id number] PT Time Calculation (min) (ACUTE ONLY): 23 min  Charges:   PT Evaluation $PT Eval Low Complexity: 1 Low PT Treatments $Self Care/Home Management: 8-22       Stacie Glaze, PT DPT Acute Rehabilitation Services Pager [phone]  Office [phone]   Roxine Caddy E Ruffin Pyo 05/16/2021, 11:29 AM

## 2021-05-16 NOTE — Progress Notes (Signed)
PROGRESS NOTE    [name]  [id number] DOB: Aug 13, 1961 DOA: 05/15/2021 PCP: Boyce Medici, FNP   Brief Narrative:  [name] is a 60 y.o. 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 Presented with ongoing nausea and vomiting Was recently admitted  for persistent nausea vomiting and constipation discharged on 05/14/2021 with resolution of symptoms. During patient's last hospitalization he had a CT of the abdomen pelvis which was negative and a head CT without acute findings.  Patient had been taking Zofran at his facility but it was not helping. During his recent admission was noted to have coffee-ground emesis felt to be secondary to Mallory-Weiss tear GI consulted not recommended holding off on EGD.  Initially nausea and vomiting resolved although it has resumed once patient went back to nursing home facility.  Hemoglobin did remain stable and he was added on PPI.  Patient representing with nausea vomiting extremely poor p.o. intake from facility for reevaluation, and GI to be consulted for their expertise.  Assessment & Plan:   Active Problems:   Hyperlipidemia associated with type 2 diabetes mellitus (HCC)   Hypokalemia   Schizophrenia (HCC)   History of CVA with residual deficit   Chronic combined systolic (congestive) and diastolic (congestive) heart failure (HCC)   Benign essential HTN   Intractable nausea and vomiting   Intractable nausea and vomiting -Patient had episode of vomiting again this morning immediately after swallow evaluation with speech, concerning for stricture or obstruction. -GI consulted for further expertise, pending esophagram or barium swallow versus endoscopy per their expertise -Recent history of coffee-ground emesis appears to have resolved -today only vomiting food  and water that he had just taken in  History of multiple CVAs with residual right-sided weakness-okay to resume antiplatelet agents per GI, he is also now on a PPI  Essential hypertension-continue home medications  Hyperlipidemia-continue statin History of schizophrenia-continue home medications Chronic combined CHF-appears euvolemic, continue home medications  Type 2 diabetes mellitus-continue home medications Constipation-resolved with aggressive bowel regimen, continue on discharge  DVT prophylaxis: None given previous episodes of hematemesis coffee-ground emesis Code Status: Full Family Communication: None present  Status is: Inpatient  Dispo: The patient is from: Facility              Anticipated d/c is to: Same              Anticipated d/c date is: 48 to 72 hours              Patient currently not medically stable for discharge  Consultants:  GI, [facility]  Procedures:  Pending above  Antimicrobials:  Not currently indicated  Subjective: No acute issues or events overnight, patient essentially n.p.o. per his own accord, this morning after attending to take p.o. with speech therapy he had episode of vomiting within 1 minutes concerning for obstructive or stricture.  He denies any abdominal pain diarrhea constipation otherwise just feels fatigued.  Review of systems markedly limited but patient able to shake his head yes or no appropriately to simple questions.  Objective: Vitals:   05/15/21 2215 05/16/21 0000 05/16/21 0119 05/16/21 0724  BP: (!) 159/98 (!) 126/92 (!) 181/99 (!) 183/119  Pulse: 83 (!) 59 72 84  Resp: (!) '25  19 16  '$ Temp:   98.3 F (36.8 C) 98.4 F (36.9 C)  TempSrc:   Oral Oral  SpO2: 93%  98% 100% 100%  Weight:      Height:        Intake/Output Summary (Last 24 hours) at 05/16/2021 0758 Last data filed at 05/16/2021 0531 Gross per 24 hour  Intake 886.32 ml  Output --  Net 886.32 ml   Filed Weights   05/15/21 1421  Weight: 79.4 kg     Examination:  General exam: Appears calm and comfortable  Respiratory system: Clear to auscultation. Respiratory effort normal. Cardiovascular system: S1 & S2 heard, RRR. No JVD, murmurs, rubs, gallops or clicks. No pedal edema. Gastrointestinal system: Abdomen is nondistended, soft and nontender. No organomegaly or masses felt. Normal bowel sounds heard. Central nervous system: Alert and oriented. No focal neurological deficits. Extremities: Symmetric 5 x 5 power. Skin: No rashes, lesions  Data Reviewed: I have personally reviewed following labs and imaging studies  CBC: Recent Labs  Lab 05/12/21 1407 05/13/21 0207 05/13/21 0539 05/14/21 0744 05/15/21 1431 05/16/21 0057  WBC 8.5 9.6 9.4 9.0 8.1 8.0  NEUTROABS 5.8  --   --   --  5.1 4.8  HGB 13.9 13.3 13.2 12.5* 13.1 12.1*  HCT 42.5 42.2 41.3 39.1 41.3 37.7*  MCV 84.5 86.5 85.3 85.0 85.2 85.1  PLT 219 222 212 196 224 [id number]   Basic Metabolic Panel: Recent Labs  Lab 05/12/21 1407 05/13/21 0539 05/15/21 1431 05/16/21 0057 05/16/21 0535  NA 138 139 139 138  138  --   K 3.4* 3.0* 3.0* 3.1*  3.1*  --   CL 97* 98 98 101  102  --   CO2 '26 29 29 29  28  '$ --   GLUCOSE 108* 105* 115* 107*  107*  --   BUN 33* 28* '10 13  11  '$ --   CREATININE 1.39* 1.27* 1.21 1.20  1.18  --   CALCIUM 9.6 9.5 9.4 8.9  8.9  --   MG  --   --   --  1.7 1.6*  PHOS  --   --   --  3.0 2.7   GFR: Estimated Creatinine Clearance: 72.7 mL/min (by C-G formula based on SCr of 1.18 mg/dL). Liver Function Tests: Recent Labs  Lab 05/12/21 1407 05/15/21 1431 05/16/21 0057  AST 18 14* 14*  ALT '8 7 7  '$ ALKPHOS 89 82 71  BILITOT 1.1 1.0 0.8  PROT 8.8* 7.9 7.0  ALBUMIN 4.8 4.3 3.9   Recent Labs  Lab 05/12/21 1407 05/15/21 1431  LIPASE 46 37   No results for input(s): AMMONIA in the last 168 hours. Coagulation Profile: No results for input(s): INR, PROTIME in the last 168 hours. Cardiac Enzymes: Recent Labs  Lab 05/16/21 0057  CKTOTAL  110   BNP (last 3 results) No results for input(s): PROBNP in the last 8760 hours. HbA1C: No results for input(s): HGBA1C in the last 72 hours. CBG: Recent Labs  Lab 05/14/21 0742 05/14/21 1139 05/14/21 1730 05/16/21 0417 05/16/21 0726  GLUCAP 109* 170* 96 112* 102*   Lipid Profile: No results for input(s): CHOL, HDL, LDLCALC, TRIG, CHOLHDL, LDLDIRECT in the last 72 hours. Thyroid Function Tests: Recent Labs    05/16/21 0057  TSH 2.868   Anemia Panel: No results for input(s): VITAMINB12, FOLATE, FERRITIN, TIBC, IRON, RETICCTPCT in the last 72 hours. Sepsis Labs: Recent Labs  Lab 05/16/21 0057  LATICACIDVEN 0.9    Recent Results (from the past 240 hour(s))  Resp Panel by RT-PCR (Flu A&B, Covid) Nasopharyngeal Swab     Status: None  Collection Time: 05/13/21  3:30 AM   Specimen: Nasopharyngeal Swab; Nasopharyngeal(NP) swabs in vial transport medium  Result Value Ref Range Status   SARS Coronavirus 2 by RT PCR NEGATIVE NEGATIVE Final    Comment: (NOTE) SARS-CoV-2 target nucleic acids are NOT DETECTED.  The SARS-CoV-2 RNA is generally detectable in upper respiratory specimens during the acute phase of infection. The lowest concentration of SARS-CoV-2 viral copies this assay can detect is 138 copies/mL. A negative result does not preclude SARS-Cov-2 infection and should not be used as the sole basis for treatment or other patient management decisions. A negative result may occur with  improper specimen collection/handling, submission of specimen other than nasopharyngeal swab, presence of viral mutation(s) within the areas targeted by this assay, and inadequate number of viral copies(<138 copies/mL). A negative result must be combined with clinical observations, patient history, and epidemiological information. The expected result is Negative.  Fact Sheet for Patients:  EntrepreneurPulse.com.au  Fact Sheet for Healthcare Providers:   IncredibleEmployment.be  This test is no t yet approved or cleared by the [place] FDA and  has been authorized for detection and/or diagnosis of SARS-CoV-2 by FDA under an Emergency Use Authorization (EUA). This EUA will remain  in effect (meaning this test can be used) for the duration of the COVID-19 declaration under Section 564(b)(1) of the Act, 21 U.S.C.section 360bbb-3(b)(1), unless the authorization is terminated  or revoked sooner.       Influenza A by PCR NEGATIVE NEGATIVE Final   Influenza B by PCR NEGATIVE NEGATIVE Final    Comment: (NOTE) The Xpert Xpress SARS-CoV-2/FLU/RSV plus assay is intended as an aid in the diagnosis of influenza from Nasopharyngeal swab specimens and should not be used as a sole basis for treatment. Nasal washings and aspirates are unacceptable for Xpert Xpress SARS-CoV-2/FLU/RSV testing.  Fact Sheet for Patients: EntrepreneurPulse.com.au  Fact Sheet for Healthcare Providers: IncredibleEmployment.be  This test is not yet approved or cleared by the [place] FDA and has been authorized for detection and/or diagnosis of SARS-CoV-2 by FDA under an Emergency Use Authorization (EUA). This EUA will remain in effect (meaning this test can be used) for the duration of the COVID-19 declaration under Section 564(b)(1) of the Act, 21 U.S.C. section 360bbb-3(b)(1), unless the authorization is terminated or revoked.  Performed at [facility], [place] [address], [place], [place] [address]       Radiology Studies: DG Abdomen Acute W/Chest  Result Date: 05/15/2021 CLINICAL DATA:  Nausea and vomiting for 2 days. EXAM: DG ABDOMEN ACUTE WITH 1 VIEW CHEST COMPARISON:  None. FINDINGS: There is no evidence of dilated bowel loops or free intraperitoneal air. A moderate amount of stool is seen within the descending colon. No radiopaque calculi or other significant radiographic  abnormality is seen. The cardiac silhouette is mildly enlarged and unchanged in size. Both lungs are clear. A chronic deformity is seen involving the distal right clavicle. IMPRESSION: Negative abdominal radiographs.  No acute cardiopulmonary disease. Electronically Signed   By: Virgina Norfolk M.D.   On: 05/15/2021 16:37    Scheduled Meds:  amLODipine  10 mg Oral q morning   atorvastatin  40 mg Oral q1800   carvedilol  25 mg Oral BID WC   clopidogrel  75 mg Oral q morning   FLUoxetine  10 mg Oral Daily   insulin aspart  0-9 Units Subcutaneous Q4H   paliperidone  6 mg Oral q morning   pantoprazole (PROTONIX) IV  40 mg Intravenous QHS  polyethylene glycol  17 g Oral BID   senna  2 tablet Oral QHS   Continuous Infusions:  sodium chloride 75 mL/hr (05/16/21 0301)     LOS: 0 days   Time spent: 77mn  Grae Cannata C Dannisha Eckmann, DO [facility]  If 7PM-7AM, please contact night-coverage [email or web address]  05/16/2021, 7:58 AM

## 2021-05-16 NOTE — Evaluation (Signed)
Clinical/Bedside Swallow Evaluation Patient Details  Name: [name] MRN: [id number] Date of Birth: July 13, 1961  Today's Date: 05/16/2021 Time: SLP Start Time (ACUTE ONLY): 1056 SLP Stop Time (ACUTE ONLY): 1116 SLP Time Calculation (min) (ACUTE ONLY): 20 min  Past Medical History:  Past Medical History:  Diagnosis Date   Alcohol abuse    Arthritis    Crack cocaine use    Depression    Diabetes mellitus    Drug abuse ([place])    DTs (delirium tremens) ([place])    history of    Gout    Noncompliance with medication regimen    Schizophrenia ([place])    Stroke ([place])    Systolic heart failure secondary to hypertension ([place])    Uncontrolled hypertension    Past Surgical History:  Past Surgical History:  Procedure Laterality Date   LEFT HEART CATH AND CORONARY ANGIOGRAPHY N/A 02/03/2018   Procedure: LEFT HEART CATH AND CORONARY ANGIOGRAPHY;  Surgeon: Jettie Booze, MD;  Location: [facility];  Service: Cardiovascular;  Laterality: N/A;   MANDIBLE RECONSTRUCTION     TONSILLECTOMY     HPI:  Pt is a 60 y.o. male with ongoing nausea and vomiting after recent discharge on 05/14/21. During recent admission pt noted to have coffee-ground emesis felt to be secondary to Mallory-Weiss tear GI consulted but recommended holding off on EGD. Palliative Care: full scope, full code. 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 MBS 04/19/20:  Oropharyngeal dysphagia with left labial weakness, and a pharyngeal delay; regular texture diet and thin liquids recommended   Assessment / Plan / Recommendation Clinical Impression  Pt was seen for bedside swallow evaluation. He was alert and cooperative during the evaluation. Pt was consistently using oral suction upon SLP's arrival. He denied any baseline difficulty with oropharyngeal swallowing, but reported recent  weight loss and stated that he has not been able to eat. Oral mechanism exam was significant for facial weakness and reduced lingual strength. Pt presented with six anterior mandibular teeth only and denied use of dentures. Pt demonstrated anterior spillage and mild oral residue. No s/sx of aspiration were noted. Pt exhibited emesis during mastication of regular texture solids. Additional trials were deferred for this reason and to allow pt to be cleaned by staff.  [name] case was discussed with Dr. Avon Brown who advised to initiate a clear liquid diet instead of dysphagia 1 at this time. SLP will follow pt. SLP Visit Diagnosis: Dysphagia, unspecified (R13.10)    Aspiration Risk  Mild aspiration risk    Diet Recommendation Dysphagia 1 (puree), Thin liquid (clear liquids will be initiated per MD's recommendation)   Liquid Administration via: Cup;Straw Medication Administration: Whole meds with puree Supervision: Staff to assist with self feeding Compensations: Small sips/bites;Slow rate Postural Changes: Seated upright at 90 degrees;Remain upright for at least 30 minutes after po intake    Other  Recommendations Oral Care Recommendations: Oral care BID   Follow up Recommendations  (TBD)      Frequency and Duration min 2x/week  2 weeks       Prognosis Prognosis for Safe Diet Advancement: Good      Swallow Study   General Date of Onset: 05/15/21 HPI: Pt is a 60 y.o. male with ongoing nausea and vomiting after recent discharge on 05/14/21. During recent admission pt noted to have coffee-ground emesis felt to be secondary to Mallory-Weiss tear GI consulted but recommended  holding off on EGD. Palliative Care: full scope, full code. 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 MBS 04/19/20:  Oropharyngeal dysphagia with left labial weakness, and a pharyngeal delay;  regular texture diet and thin liquids recommended Type of Study: Bedside Swallow Evaluation Previous Swallow Assessment: See HPI Diet Prior to this Study: Dysphagia 3 (soft);Thin liquids Temperature Spikes Noted: No Respiratory Status: Room air History of Recent Intubation: No Behavior/Cognition: Alert;Cooperative;Pleasant mood Oral Cavity Assessment: Within Functional Limits Oral Care Completed by SLP: No Oral Cavity - Dentition: Poor condition;Missing dentition (six mandibular teeth only) Vision: Functional for self-feeding Self-Feeding Abilities: Needs assist Patient Positioning: Upright in bed;Postural control adequate for testing Baseline Vocal Quality: Normal Volitional Swallow: Able to elicit    Oral/Motor/Sensory Function Overall Oral Motor/Sensory Function: Moderate impairment Facial ROM: Reduced right;Reduced left;Suspected CN VII (facial) dysfunction Lingual Strength: Reduced;Suspected CN XII (hypoglossal) dysfunction   Ice Chips Ice chips: Within functional limits Presentation: Spoon   Thin Liquid Thin Liquid: Within functional limits Presentation: Straw;Spoon    Nectar Thick Nectar Thick Liquid: Not tested   Honey Thick Honey Thick Liquid: Not tested   Puree Puree: Impaired Presentation: Spoon Oral Phase Impairments: Reduced labial seal   Solid     Solid: Impaired Presentation: Self Fed Oral Phase Impairments: Impaired mastication     [name] I. [name], [place], [place] Office number [phone] Pager [place] 05/16/2021,1:20 PM

## 2021-05-16 NOTE — NC FL2 (Signed)
[place] LEVEL OF CARE SCREENING TOOL     IDENTIFICATION  Patient Name: [name] Birthdate: Apr 12, 1961 Sex: male Admission Date (Current Location): 05/15/2021  [place] and [place] Number:  Kathleen Argue [id number] [place] and Address:  [facility] [place], [place] [address] [place], [place] [address]      Provider Number: [id number]  Attending Physician Name and Address:  Little Ishikawa, MD  Relative Name and Phone Number:       Current Level of Care: Hospital Recommended Level of Care: [place] Prior Approval Number:    Date Approved/Denied:   PASRR Number: [id number] E espires 05/17/21  Discharge Plan: SNF    Current Diagnoses: Patient Active Problem List   Diagnosis Date Noted   Intractable vomiting with nausea, unspecified vomiting type 05/16/2021   Intractable nausea and vomiting 05/15/2021   Hematemesis 05/13/2021   Benign essential HTN    Chronic diastolic congestive heart failure ([place])    Dysphagia, post-stroke    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[place]) 04/20/2020   CVA (cerebral vascular accident) ([place]) 04/18/2020   Right sided weakness 04/17/2020   History of CVA with residual deficit 04/17/2020   Chronic combined systolic (congestive) and diastolic (congestive) heart failure ([place]) 04/17/2020   Substance use disorder 04/17/2020   Abnormal EKG 02/07/2020   Left hip pain 02/07/2020   AKI (acute kidney injury) ([place]) [id number]   Systolic heart failure secondary to hypertension ([place])    Schizophrenia ([place])    Acute systolic heart failure ([place]) 02/02/2018   Chest pain 01/31/2018   Hypokalemia    Schizoaffective disorder, bipolar type ([place]) 11/22/2017   Trauma    Tachycardia 03/03/2017   Alcohol abuse    Cocaine abuse ([place])    Hypertensive urgency    Tachycardia    Cocaine abuse with cocaine-induced mood disorder ([place]) 12/05/2016   Cerebrovascular disease 09/02/2015   Neurocognitive  disorder, unspecified secondary to cerebrovascular disease 09/02/2015   Malignant hypertension 08/30/2015   Alcohol use disorder, severe, dependence ([place]) 08/30/2015   Alcohol withdrawal ([place]) 08/30/2015   Alcohol abuse with alcohol-induced mood disorder ([place]) 08/30/2015   Thrombocytopenia ([place]) 08/27/2015   Tobacco use disorder 08/26/2015   Hyperlipidemia associated with type 2 diabetes mellitus ([place]) 04/28/2015   Schizoaffective disorder, depressive type ([place])    Hypertension associated with diabetes ([place]) 08/09/2013   Gout 08/09/2013   Diabetes mellitus ([place]) 07/09/2012    Orientation RESPIRATION BLADDER Height & Weight     Self  Normal Incontinent, External catheter Weight: 175 lb (79.4 kg) Height:  '5\' 11"'$  (180.3 cm)  BEHAVIORAL SYMPTOMS/MOOD NEUROLOGICAL BOWEL NUTRITION STATUS      Incontinent Diet (Please see DC Summary)  AMBULATORY STATUS COMMUNICATION OF NEEDS Skin   Extensive Assist Verbally Other (Comment) (wound on toe; skin tear on hand)                       Personal Care Assistance Level of Assistance  Bathing, Feeding, Dressing   Feeding assistance: Limited assistance Dressing Assistance: Limited assistance     Functional Limitations Info             SPECIAL CARE FACTORS FREQUENCY  PT (By licensed PT), OT (By licensed OT)     PT Frequency: 2x/week OT Frequency: 2x/week            Contractures Contractures Info: Not present    Additional Factors Info  Code Status, Allergies, Psychotropic, Insulin Sliding Scale Code Status Info: Full Allergies Info:  NKA Psychotropic Info: see DC Summary Insulin Sliding Scale Info: see DC Summary       Current Medications (05/16/2021):  This is the current hospital active medication list Current Facility-Administered Medications  Medication Dose Route Frequency Provider Last Rate Last Admin   acetaminophen (TYLENOL) tablet 650 mg  650 mg Oral Q6H PRN Doutova, Anastassia, MD   650 mg at 05/16/21 0443   Or    acetaminophen (TYLENOL) suppository 650 mg  650 mg Rectal Q6H PRN Doutova, Anastassia, MD       amLODipine (NORVASC) tablet 10 mg  10 mg Oral q morning Doutova, Anastassia, MD   10 mg at 05/16/21 0906   atorvastatin (LIPITOR) tablet 40 mg  40 mg Oral q1800 Doutova, Anastassia, MD       carvedilol (COREG) tablet 25 mg  25 mg Oral BID WC Doutova, Anastassia, MD   25 mg at 05/16/21 1544   clopidogrel (PLAVIX) tablet 75 mg  75 mg Oral q morning Doutova, Anastassia, MD   75 mg at 05/16/21 0908   feeding supplement (ENSURE ENLIVE / ENSURE PLUS) liquid 237 mL  237 mL Oral TID BM Little Ishikawa, MD   237 mL at 05/16/21 1342   FLUoxetine (PROZAC) capsule 10 mg  10 mg Oral Daily Doutova, Anastassia, MD   10 mg at 05/16/21 0906   hydrALAZINE (APRESOLINE) injection 5 mg  5 mg Intravenous Q8H PRN Little Ishikawa, MD       insulin aspart (novoLOG) injection 0-9 Units  0-9 Units Subcutaneous Q4H Doutova, Anastassia, MD       metoCLOPramide (REGLAN) injection 10 mg  10 mg Intravenous Q8H Rosezella Rumpf, NP   10 mg at 05/16/21 1332   multivitamin with minerals tablet 1 tablet  1 tablet Oral Daily Little Ishikawa, MD   1 tablet at 05/16/21 1342   ondansetron (ZOFRAN) injection 4 mg  4 mg Intravenous Q6H PRN Little Ishikawa, MD   4 mg at 05/16/21 1144   ondansetron (ZOFRAN) tablet 4 mg  4 mg Oral Q6H PRN Little Ishikawa, MD       paliperidone (INVEGA) 24 hr tablet 6 mg  6 mg Oral q morning Doutova, Anastassia, MD   6 mg at 05/16/21 0906   pantoprazole (PROTONIX) injection 40 mg  40 mg Intravenous QHS Doutova, Anastassia, MD   40 mg at 05/16/21 0142   polyethylene glycol (MIRALAX / GLYCOLAX) packet 17 g  17 g Oral BID Toy Baker, MD   17 g at 05/16/21 0906   potassium chloride SA (KLOR-CON) CR tablet 20 mEq  20 mEq Oral BID Little Ishikawa, MD   20 mEq at 05/16/21 0906   promethazine (PHENERGAN) 6.25 mg in sodium chloride 0.9 % 50 mL IVPB  6.25 mg Intravenous Q8H PRN  Little Ishikawa, MD       senna (SENOKOT) tablet 17.2 mg  2 tablet Oral QHS Toy Baker, MD   17.2 mg at 05/16/21 0147     Discharge Medications: Please see discharge summary for a list of discharge medications.  Relevant Imaging Results:  Relevant Lab Results:   Additional Information SS# [id number]  Benard Halsted, LCSW

## 2021-05-16 NOTE — Progress Notes (Signed)
Pt arrived to the unit at 0119 via stretcher. Pt was situated and oriented to the room and is now resting. Call bell is within reach.

## 2021-05-16 NOTE — Progress Notes (Signed)
Pt BP elevated at 189/104. Other VS stable. PRN hydralazine given at 1746, no improvement. Pt unable to tolerate POs due to nausea and vomiting, no fluids ordered currently. [place] Crane, MD notified about above findings. New orders placed, please view MAR.

## 2021-05-16 NOTE — Progress Notes (Signed)
Subjective: No complaints of nausea. No complaints of abdominal pain.  Objective: Vital signs in last 24 hours: Temp:  [97.8 F (36.6 C)-98.4 F (36.9 C)] 98.2 F (36.8 C) (06/29 1118) Pulse Rate:  [59-91] 76 (06/29 1118) Resp:  [11-25] 20 (06/29 1118) BP: (126-183)/(85-119) 170/106 (06/29 1118) SpO2:  [93 %-100 %] 98 % (06/29 1118) Weight:  [79.4 kg] 79.4 kg (06/28 1421) Weight change:     PE: GEN:  Older-appearing than stated age, NAD NEURO:  Awake, dysarthric but conversant, right hemiparesis, right facial droop ABD:  Soft, non tender, non-distended  Lab Results: CBC    Component Value Date/Time   WBC 8.0 05/16/2021 0057   RBC 4.43 05/16/2021 0057   HGB 12.1 (L) 05/16/2021 0057   HCT 37.7 (L) 05/16/2021 0057   PLT 192 05/16/2021 0057   MCV 85.1 05/16/2021 0057   MCH 27.3 05/16/2021 0057   MCHC 32.1 05/16/2021 0057   RDW 13.1 05/16/2021 0057   LYMPHSABS 2.2 05/16/2021 0057   MONOABS 0.8 05/16/2021 0057   EOSABS 0.2 05/16/2021 0057   BASOSABS 0.0 05/16/2021 0057  CMP     Component Value Date/Time   NA 138 05/16/2021 0057   NA 138 05/16/2021 0057   K 3.1 (L) 05/16/2021 0057   K 3.1 (L) 05/16/2021 0057   CL 102 05/16/2021 0057   CL 101 05/16/2021 0057   CO2 28 05/16/2021 0057   CO2 29 05/16/2021 0057   GLUCOSE 107 (H) 05/16/2021 0057   GLUCOSE 107 (H) 05/16/2021 0057   BUN 11 05/16/2021 0057   BUN 13 05/16/2021 0057   CREATININE 1.18 05/16/2021 0057   CREATININE 1.20 05/16/2021 0057   CALCIUM 8.9 05/16/2021 0057   CALCIUM 8.9 05/16/2021 0057   PROT 7.0 05/16/2021 0057   ALBUMIN 3.9 05/16/2021 0057   AST 14 (L) 05/16/2021 0057   ALT 7 05/16/2021 0057   ALKPHOS 71 05/16/2021 0057   BILITOT 0.8 05/16/2021 0057   GFRNONAA >60 05/16/2021 0057   GFRNONAA >60 05/16/2021 0057   GFRAA >60 05/16/2020 0609   Assessment:   Polysubstance abuse. Multifactorial CHF. Recurrent strokes with dysarthria. Nausea/vomiting by report?  Sounds more oropharyngeal in  nature to me.  Plan:   I do not think patient has gastric outlet obstruction, xray this admission did not show distended stomach, and he had CT few days ago that did not support gastric outlet obstruction.  History much more supportive of oropharyngeal dysphagia from stroke, possible with component of esophagitis as well. Would treat around the clock antiemetics and PPI and see how he does with diet trials over the next couple days.  Endoscopy would be higher-than-average risk and would be fairly low yield and would require he be off plavix 3 days prior to doing the procedure. [facility] will follow.   Landry Dyke 05/16/2021, 12:37 PM   Cell [phone] If no answer or after 5 PM call [phone]

## 2021-05-16 NOTE — Evaluation (Signed)
Occupational Therapy Evaluation Patient Details Name: [name] MRN: [id number] DOB: 1961/09/01 Today's Date: 05/16/2021    History of Present Illness 60 y.o. male presenting to ED 6/28 from [employer] with intractable N/V. Patient with recent admission 05/14/2021 with similar complaint. GI consult not recommended at that time. PMHx significant for multiple CVA's with residual R hemiparesis, dysarthria and neurocognitive impairment, CHF, DMII, HTN, schizoaffective disorder, chronic L hip pain and polysubstance abuse.   Clinical Impression   Patient from [facility] where he is a long-term care resident. Bedbound at baseline reporting occasionally getting to wheelchair via hoyer lift. Patient reports assist with all ADLs including self-feeding and grooming at baseline. Patient currently functioning at his baseline require Total A for all aspects of bed mobility. Patient with decreased skin integrity with skin tear to L wrist. RN aware. Patient able to extend all digits bilaterally and make a fist. No suspected need for splinting at this time. Patient does not require continued acute occupational therapy services with OT to sign off at this time.     Follow Up Recommendations  SNF    Equipment Recommendations  None recommended by OT    Recommendations for Other Services       Precautions / Restrictions Precautions Precautions: Fall Precaution Comments: R hemiparesis, significant debility, decreased skin integrity Restrictions Weight Bearing Restrictions: No      Mobility Bed Mobility Overal bed mobility: Needs Assistance Bed Mobility: Supine to Sit;Sit to Supine;Rolling Rolling: Total assist   Supine to sit: Total assist Sit to supine: Total assist   General bed mobility comments: Dependent for all parts of bed mobility.    Transfers                 General transfer comment: Use of hoyer at baseline.    Balance Overall balance assessment: Needs  assistance Sitting-balance support: Feet supported Sitting balance-Leahy Scale: Zero Sitting balance - Comments: max assist to sit upright, tolerance <30 seconds Postural control: Right lateral lean     Standing balance comment: nt                           ADL either performed or assessed with clinical judgement   ADL Overall ADL's : At baseline                                       General ADL Comments: Dependent     Vision Patient Visual Report: No change from baseline       Perception     Praxis      Pertinent Vitals/Pain Pain Assessment: Faces Faces Pain Scale: Hurts little more Pain Location: all over, generalized with movement Pain Descriptors / Indicators: Discomfort;Aching;Sore Pain Intervention(s): Limited activity within patient's tolerance;Monitored during session;Repositioned     Hand Dominance Left (L handed prior to CVA. No uses R hand to compensate for LUE hemiplegia.)   Extremity/Trunk Assessment Upper Extremity Assessment Upper Extremity Assessment: RUE deficits/detail;LUE deficits/detail RUE Deficits / Details: Shoulder flexion limited to 80 degrees. MMT 2+/5 at shoulder and 3+/5 at elbow, wrist and digits. Noted fair functional use. RUE Sensation: decreased light touch RUE Coordination: decreased fine motor;decreased gross motor LUE Deficits / Details: Able to slightly lift LUE from bed surface. MMT grossly 2+/5 throughout. LUE Sensation: decreased light touch LUE Coordination: decreased fine motor;decreased gross motor   Lower Extremity Assessment Lower Extremity Assessment:  Defer to PT evaluation RLE Deficits / Details: 2/5 knee flex, ext; trace DF/PF - history of CVA. Windswept appearance LEs to R LLE Deficits / Details: 2+/5 knee flex/ext, trace DF/PF   Cervical / Trunk Assessment Cervical / Trunk Assessment: Other exceptions Cervical / Trunk Exceptions: posterior and R lateral leaning; forward head with R head  tilt.   Communication Communication Communication: Expressive difficulties   Cognition Arousal/Alertness: Awake/alert Behavior During Therapy: Flat affect Overall Cognitive Status: No family/caregiver present to determine baseline cognitive functioning Area of Impairment: Attention;Following commands;Safety/judgement;Problem solving                   Current Attention Level: Sustained   Following Commands: Follows one step commands with increased time Safety/Judgement: Decreased awareness of safety;Decreased awareness of deficits   Problem Solving: Slow processing;Difficulty sequencing;Requires verbal cues;Requires tactile cues General Comments: Residual cognitive deficits 2/2 Hx of multiple CVA's. No family present at bedside to confirm baseline cognition.   General Comments  pt soiled in urine, requiring total care for clean up. no sacral pad donned, NT notified and pt is high risk for skin breakdown. PT inspected pt feet and heels, callus formation along heels and infection presentation on great and second toe on R foot, PT floated pt heels with pillow for skin integrity. Very poor seretion management during session    Exercises     Shoulder Instructions      Home Living Family/patient expects to be discharged to:: Skilled nursing facility                                 Additional Comments: From [facility]      Prior Functioning/Environment Level of Independence: Needs assistance  Gait / Transfers Assistance Needed: Bedbound at baseline; reports hoyer lift for transfer to wc ADL's / Homemaking Assistance Needed: Assist with all ADLs including grooming and self-feeding. Communication / Swallowing Assistance Needed: Residual dysarthria from previous CVA.          OT Problem List: Decreased strength;Decreased range of motion;Decreased activity tolerance;Impaired balance (sitting and/or standing);Decreased coordination;Decreased cognition;Decreased  safety awareness;Impaired sensation;Impaired tone;Impaired UE functional use;Pain      OT Treatment/Interventions:      OT Goals(Current goals can be found in the care plan section) Acute Rehab OT Goals Patient Stated Goal: No goals stated. OT Goal Formulation: With patient  OT Frequency:     Barriers to D/C:            Co-evaluation              AM-PAC OT "6 Clicks" Daily Activity     Outcome Measure Help from another person eating meals?: A Lot Help from another person taking care of personal grooming?: A Lot Help from another person toileting, which includes using toliet, bedpan, or urinal?: Total Help from another person bathing (including washing, rinsing, drying)?: Total Help from another person to put on and taking off regular upper body clothing?: Total Help from another person to put on and taking off regular lower body clothing?: Total 6 Click Score: 8   End of Session    Activity Tolerance: Patient tolerated treatment well Patient left: in bed;with call bell/phone within reach;with bed alarm set  OT Visit Diagnosis: Muscle weakness (generalized) (M62.81);Ataxia, unspecified (R27.0);Hemiplegia and hemiparesis Hemiplegia - Right/Left:  (bilateral) Hemiplegia - caused by: Cerebral infarction  Time: [id number] OT Time Calculation (min): 21 min Charges:  OT General Charges $OT Visit: 1 Visit OT Evaluation $OT Eval Moderate Complexity: 1 Mod  Octaviano Mukai H. OTR/L Supplemental OT, Department of rehab services [phone]  Preciliano Castell R H. 05/16/2021, 1:42 PM

## 2021-05-17 DIAGNOSIS — R112 Nausea with vomiting, unspecified: Secondary | ICD-10-CM | POA: Diagnosis not present

## 2021-05-17 DIAGNOSIS — I5042 Chronic combined systolic (congestive) and diastolic (congestive) heart failure: Secondary | ICD-10-CM | POA: Diagnosis not present

## 2021-05-17 DIAGNOSIS — I10 Essential (primary) hypertension: Secondary | ICD-10-CM | POA: Diagnosis not present

## 2021-05-17 DIAGNOSIS — F209 Schizophrenia, unspecified: Secondary | ICD-10-CM | POA: Diagnosis not present

## 2021-05-17 LAB — DRUG PROFILE, UR, 9 DRUGS (LABCORP)
Amphetamines, Urine: NEGATIVE ng/mL
Barbiturate, Ur: NEGATIVE ng/mL
Benzodiazepine Quant, Ur: NEGATIVE ng/mL
Cannabinoid Quant, Ur: NEGATIVE ng/mL
Cocaine (Metab.): NEGATIVE ng/mL
Methadone Screen, Urine: NEGATIVE ng/mL
Opiate Quant, Ur: NEGATIVE ng/mL
Phencyclidine, Ur: NEGATIVE ng/mL
Propoxyphene, Urine: NEGATIVE ng/mL

## 2021-05-17 LAB — GLUCOSE, CAPILLARY
Glucose-Capillary: 116 mg/dL — ABNORMAL HIGH (ref 70–99)
Glucose-Capillary: 108 mg/dL — ABNORMAL HIGH (ref 70–99)
Glucose-Capillary: 124 mg/dL — ABNORMAL HIGH (ref 70–99)
Glucose-Capillary: 99 mg/dL (ref 70–99)
Glucose-Capillary: 139 mg/dL — ABNORMAL HIGH (ref 70–99)

## 2021-05-17 LAB — CBC
HCT: 38.6 % — ABNORMAL LOW (ref 39.0–52.0)
Hemoglobin: 12.3 g/dL — ABNORMAL LOW (ref 13.0–17.0)
MCH: 26.9 pg (ref 26.0–34.0)
MCHC: 31.9 g/dL (ref 30.0–36.0)
MCV: 84.5 fL (ref 80.0–100.0)
Platelets: 206 10*3/uL (ref 150–400)
RBC: 4.57 MIL/uL (ref 4.22–5.81)
RDW: 13.2 % (ref 11.5–15.5)
WBC: 7.7 10*3/uL (ref 4.0–10.5)
nRBC: 0 % (ref 0.0–0.2)

## 2021-05-17 LAB — BASIC METABOLIC PANEL
Anion gap: 12 (ref 5–15)
BUN: 9 mg/dL (ref 6–20)
CO2: 27 mmol/L (ref 22–32)
Calcium: 9.4 mg/dL (ref 8.9–10.3)
Chloride: 99 mmol/L (ref 98–111)
Creatinine, Ser: 1.17 mg/dL (ref 0.61–1.24)
GFR, Estimated: >60 mL/min (ref >60)
Glucose, Bld: 111 mg/dL — ABNORMAL HIGH (ref 70–99)
Potassium: 3.1 mmol/L — ABNORMAL LOW (ref 3.5–5.1)
Sodium: 138 mmol/L (ref 135–145)

## 2021-05-17 LAB — HEMOGLOBIN A1C
Hgb A1c MFr Bld: 6.1 % — ABNORMAL HIGH (ref 4.8–5.6)
Mean Plasma Glucose: 128 mg/dL

## 2021-05-17 NOTE — Plan of Care (Signed)
  Problem: Education: Goal: Knowledge of General Education information will improve Description: Including pain rating scale, medication(s)/side effects and non-pharmacologic comfort measures Outcome: Progressing   Problem: Clinical Measurements: Goal: Respiratory complications will improve Outcome: Progressing   Problem: Nutrition: Goal: Adequate nutrition will be maintained Outcome: Progressing   

## 2021-05-17 NOTE — TOC Initial Note (Signed)
Transition of Care [facility]) - Initial/Assessment Note    Patient Details  Name: [name] MRN: [id number] Date of Birth: [birth year]  [facility]) CM/SW Contact:    Joanne Chars, LCSW Phone Number: 05/17/2021, 4:06 PM  Clinical Narrative:    CSW attempted to meet with pt in his room, pt having significant trouble communicating.  CSW spoke with pt legal guardian listed on face sheet, [name].  [name] says she is pt daughter, she confirms that she is legal guardian and agrees to email her guardianship form to [name].  Per [name], pt is not long term resident at [facility] but is there for rehab and lives with her.  She also reports that she would like to Brown at other options for rehab besides a return to [facility].  Pt is vaccinated for covid and boosted.               Expected Discharge Plan: Skilled Nursing Facility Barriers to Discharge: Continued Medical Work up, SNF Pending bed offer   Patient Goals and CMS Choice        Expected Discharge Plan and Services Expected Discharge Plan: [place] Choice: [place] arrangements for the past 2 months: [place]                                      Prior Living Arrangements/Services Living arrangements for the past 2 months: [place] Lives with:: Adult Children (daughter/legal guardian)          Need for Family Participation in Patient Care: Yes (Comment) Care giver support system in place?: Yes (comment) Current home services: Other (comment) (na) Criminal Activity/Legal Involvement Pertinent to Current Situation/Hospitalization: No - Comment as needed  Activities of Daily Living      Permission Sought/Granted                  Emotional Assessment Appearance:: Appears older than stated age Attitude/Demeanor/Rapport: Unable to Assess Affect (typically observed): Unable to  Assess Orientation: : Oriented to Self Alcohol / Substance Use: Not Applicable Psych Involvement: Outpatient Provider  Admission diagnosis:  Intractable nausea and vomiting [R11.2] Intractable vomiting with nausea, unspecified vomiting type [R11.2] Patient Active Problem List   Diagnosis Date Noted   Intractable vomiting with nausea, unspecified vomiting type 05/16/2021   Intractable nausea and vomiting 05/15/2021   Hematemesis 05/13/2021   Benign essential HTN    Chronic diastolic congestive heart failure ([place])    Dysphagia, post-stroke    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[place]) 04/20/2020   CVA (cerebral vascular accident) ([place]) 04/18/2020   Right sided weakness 04/17/2020   History of CVA with residual deficit 04/17/2020   Chronic combined systolic (congestive) and diastolic (congestive) heart failure ([place]) 04/17/2020   Substance use disorder 04/17/2020   Abnormal EKG 02/07/2020   Left hip pain 02/07/2020   AKI (acute kidney injury) ([place]) [id number]   Systolic heart failure secondary to hypertension ([place])    Schizophrenia ([place])    Acute systolic heart failure ([place]) 02/02/2018   Chest pain 01/31/2018   Hypokalemia    Schizoaffective disorder, bipolar type ([place]) 11/22/2017   Trauma    Tachycardia 03/03/2017   Alcohol abuse    Cocaine abuse ([place])    Hypertensive urgency    Tachycardia    Cocaine abuse with cocaine-induced mood disorder ([place]) 12/05/2016  Cerebrovascular disease 09/02/2015   Neurocognitive disorder, unspecified secondary to cerebrovascular disease 09/02/2015   Malignant hypertension 08/30/2015   Alcohol use disorder, severe, dependence ([place]) 08/30/2015   Alcohol withdrawal ([place]) 08/30/2015   Alcohol abuse with alcohol-induced mood disorder ([place]) 08/30/2015   Thrombocytopenia ([place]) 08/27/2015   Tobacco use disorder 08/26/2015   Hyperlipidemia associated with type 2 diabetes mellitus ([place]) 04/28/2015   Schizoaffective disorder, depressive type ([place])     Hypertension associated with diabetes ([place]) 08/09/2013   Gout 08/09/2013   Diabetes mellitus ([place]) 07/09/2012   PCP:  Boyce Medici, FNP Pharmacy:   [id number] - [place], [address] [address] Phone: [phone] Fax: [phone]     Social Determinants of Health (SDOH) Interventions    Readmission Risk Interventions No flowsheet data found.

## 2021-05-17 NOTE — Progress Notes (Signed)
PROGRESS NOTE    [name]  [id number] DOB: August 06, 1961 DOA: 05/15/2021 PCP: Boyce Medici, FNP   Brief Narrative:  [name] is a 60 y.o. 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 Presented with ongoing nausea and vomiting. Was recently admitted  for persistent nausea vomiting and constipation discharged on 05/14/2021 with resolution of symptoms. During patient's last hospitalization he had a CT of the abdomen pelvis which was negative and a head CT without acute findings.  Patient had been taking Zofran at his facility but it was not helping. During his recent admission was noted to have coffee-ground emesis felt to be secondary to Mallory-Weiss tear GI consulted not recommended holding off on EGD.  Initially nausea and vomiting resolved although it has resumed once patient went back to nursing home facility.  Hemoglobin did remain stable and he was added on PPI.  Patient representing with nausea vomiting extremely poor p.o. intake from facility for reevaluation, and GI to be consulted for their expertise.  Assessment & Plan:   Active Problems:   Hyperlipidemia associated with type 2 diabetes mellitus (HCC)   Hypokalemia   Schizophrenia (HCC)   History of CVA with residual deficit   Chronic combined systolic (congestive) and diastolic (congestive) heart failure (HCC)   Benign essential HTN   Intractable nausea and vomiting   Intractable vomiting with nausea, unspecified vomiting type   Intractable nausea and vomiting -Patient continues to have recurrent episodes of vomiting again this morning on clears despite being on clears -we will transition to n.p.o. status until speech can evaluate again  -Patient would likely benefit from esophagram versus barium swallow -GI consulted, appreciate insight  and recs -Recent history of coffee-ground emesis appears to have resolved -today only vomiting food and water that he had just taken in  History of multiple CVAs with residual right-sided weakness-okay to resume antiplatelet agents per GI, he is also now on a PPI  Essential hypertension-continue home medications  Hyperlipidemia-continue statin History of schizophrenia-continue home medications Chronic combined CHF-appears euvolemic, continue home medications  Type 2 diabetes mellitus-continue home medications Constipation-resolved with aggressive bowel regimen, continue on discharge  DVT prophylaxis: None given previous episodes of hematemesis coffee-ground emesis Code Status: Full Family Communication: None present  Status is: Inpatient  Dispo: The patient is from: Facility              Anticipated d/c is to: Same              Anticipated d/c date is: 48 to 72 hours              Patient currently not medically stable for discharge  Consultants:  GI, [facility]  Procedures:  Pending above  Antimicrobials:  Not currently indicated  Subjective: Recurrent episode of vomiting this morning with clears, patient requesting food but we discussed he is high risk for aspiration and will need to follow-up with speech first.  Objective: Vitals:   05/17/21 0620 05/17/21 0656 05/17/21 0717 05/17/21 0742  BP: (!) 177/91 (!) 163/93 (!) 156/104   Pulse:  84 88   Resp:  (!) 21 20   Temp:   98.5 F (36.9 C)   TempSrc:   Oral   SpO2:  98% 98% 97%  Weight:      Height:        Intake/Output Summary (Last 24 hours) at 05/17/2021 0744  Last data filed at 05/17/2021 0600 Gross per 24 hour  Intake 27.17 ml  Output 550 ml  Net -522.83 ml    Filed Weights   05/15/21 1421  Weight: 79.4 kg    Examination:  General exam: Appears calm and comfortable  Respiratory system: Clear to auscultation. Respiratory effort normal. Cardiovascular system: S1 & S2 heard, RRR. No JVD, murmurs, rubs,  gallops or clicks. No pedal edema. Gastrointestinal system: Abdomen is nondistended, soft and nontender. No organomegaly or masses felt. Normal bowel sounds heard. Central nervous system: Alert and oriented. No focal neurological deficits. Extremities: Symmetric 5 x 5 power. Skin: No rashes, lesions  Data Reviewed: I have personally reviewed following labs and imaging studies  CBC: Recent Labs  Lab 05/12/21 1407 05/13/21 0207 05/13/21 0539 05/14/21 0744 05/15/21 1431 05/16/21 0057 05/17/21 0321  WBC 8.5   < > 9.4 9.0 8.1 8.0 7.7  NEUTROABS 5.8  --   --   --  5.1 4.8  --   HGB 13.9   < > 13.2 12.5* 13.1 12.1* 12.3*  HCT 42.5   < > 41.3 39.1 41.3 37.7* 38.6*  MCV 84.5   < > 85.3 85.0 85.2 85.1 84.5  PLT 219   < > 212 196 224 192 206   < > = values in this interval not displayed.    Basic Metabolic Panel: Recent Labs  Lab 05/12/21 1407 05/13/21 0539 05/15/21 1431 05/16/21 0057 05/16/21 0535 05/17/21 0321  NA 138 139 139 138  138  --  138  K 3.4* 3.0* 3.0* 3.1*  3.1*  --  3.1*  CL 97* 98 98 101  102  --  99  CO2 '26 29 29 29  28  '$ --  27  GLUCOSE 108* 105* 115* 107*  107*  --  111*  BUN 33* 28* '10 13  11  '$ --  9  CREATININE 1.39* 1.27* 1.21 1.20  1.18  --  1.17  CALCIUM 9.6 9.5 9.4 8.9  8.9  --  9.4  MG  --   --   --  1.7 1.6*  --   PHOS  --   --   --  3.0 2.7  --     GFR: Estimated Creatinine Clearance: 73.3 mL/min (by C-G formula based on SCr of 1.17 mg/dL). Liver Function Tests: Recent Labs  Lab 05/12/21 1407 05/15/21 1431 05/16/21 0057  AST 18 14* 14*  ALT '8 7 7  '$ ALKPHOS 89 82 71  BILITOT 1.1 1.0 0.8  PROT 8.8* 7.9 7.0  ALBUMIN 4.8 4.3 3.9    Recent Labs  Lab 05/12/21 1407 05/15/21 1431  LIPASE 46 37    No results for input(s): AMMONIA in the last 168 hours. Coagulation Profile: No results for input(s): INR, PROTIME in the last 168 hours. Cardiac Enzymes: Recent Labs  Lab 05/16/21 0057  CKTOTAL 110    BNP (last 3 results) No  results for input(s): PROBNP in the last 8760 hours. HbA1C: Recent Labs    05/16/21 0057  HGBA1C 6.1*   CBG: Recent Labs  Lab 05/16/21 1541 05/16/21 1944 05/16/21 2304 05/17/21 0303 05/17/21 0713  GLUCAP 113* 107* 123* 116* 108*    Lipid Profile: No results for input(s): CHOL, HDL, LDLCALC, TRIG, CHOLHDL, LDLDIRECT in the last 72 hours. Thyroid Function Tests: Recent Labs    05/16/21 0057  TSH 2.868    Anemia Panel: No results for input(s): VITAMINB12, FOLATE, FERRITIN, TIBC, IRON, RETICCTPCT in the last 72 hours. Sepsis  Labs: Recent Labs  Lab 05/16/21 0057  LATICACIDVEN 0.9     Recent Results (from the past 240 hour(s))  Resp Panel by RT-PCR (Flu A&B, Covid) Nasopharyngeal Swab     Status: None   Collection Time: 05/13/21  3:30 AM   Specimen: Nasopharyngeal Swab; Nasopharyngeal(NP) swabs in vial transport medium  Result Value Ref Range Status   SARS Coronavirus 2 by RT PCR NEGATIVE NEGATIVE Final    Comment: (NOTE) SARS-CoV-2 target nucleic acids are NOT DETECTED.  The SARS-CoV-2 RNA is generally detectable in upper respiratory specimens during the acute phase of infection. The lowest concentration of SARS-CoV-2 viral copies this assay can detect is 138 copies/mL. A negative result does not preclude SARS-Cov-2 infection and should not be used as the sole basis for treatment or other patient management decisions. A negative result may occur with  improper specimen collection/handling, submission of specimen other than nasopharyngeal swab, presence of viral mutation(s) within the areas targeted by this assay, and inadequate number of viral copies(<138 copies/mL). A negative result must be combined with clinical observations, patient history, and epidemiological information. The expected result is Negative.  Fact Sheet for Patients:  EntrepreneurPulse.com.au  Fact Sheet for Healthcare Providers:   IncredibleEmployment.be  This test is no t yet approved or cleared by the [place] FDA and  has been authorized for detection and/or diagnosis of SARS-CoV-2 by FDA under an Emergency Use Authorization (EUA). This EUA will remain  in effect (meaning this test can be used) for the duration of the COVID-19 declaration under Section 564(b)(1) of the Act, 21 U.S.C.section 360bbb-3(b)(1), unless the authorization is terminated  or revoked sooner.       Influenza A by PCR NEGATIVE NEGATIVE Final   Influenza B by PCR NEGATIVE NEGATIVE Final    Comment: (NOTE) The Xpert Xpress SARS-CoV-2/FLU/RSV plus assay is intended as an aid in the diagnosis of influenza from Nasopharyngeal swab specimens and should not be used as a sole basis for treatment. Nasal washings and aspirates are unacceptable for Xpert Xpress SARS-CoV-2/FLU/RSV testing.  Fact Sheet for Patients: EntrepreneurPulse.com.au  Fact Sheet for Healthcare Providers: IncredibleEmployment.be  This test is not yet approved or cleared by the [place] FDA and has been authorized for detection and/or diagnosis of SARS-CoV-2 by FDA under an Emergency Use Authorization (EUA). This EUA will remain in effect (meaning this test can be used) for the duration of the COVID-19 declaration under Section 564(b)(1) of the Act, 21 U.S.C. section 360bbb-3(b)(1), unless the authorization is terminated or revoked.  Performed at [facility], [place] [address], [place], [place] [address]   SARS CORONAVIRUS 2 (TAT 6-24 HRS) Nasopharyngeal Nasopharyngeal Swab     Status: None   Collection Time: 05/15/21 10:14 PM   Specimen: Nasopharyngeal Swab  Result Value Ref Range Status   SARS Coronavirus 2 NEGATIVE NEGATIVE Final    Comment: (NOTE) SARS-CoV-2 target nucleic acids are NOT DETECTED.  The SARS-CoV-2 RNA is generally detectable in upper and lower respiratory specimens  during the acute phase of infection. Negative results do not preclude SARS-CoV-2 infection, do not rule out co-infections with other pathogens, and should not be used as the sole basis for treatment or other patient management decisions. Negative results must be combined with clinical observations, patient history, and epidemiological information. The expected result is Negative.  Fact Sheet for Patients: SugarRoll.be  Fact Sheet for Healthcare Providers: https://www.woods-mathews.com/  This test is not yet approved or cleared by the [place] FDA and  has  been authorized for detection and/or diagnosis of SARS-CoV-2 by FDA under an Emergency Use Authorization (EUA). This EUA will remain  in effect (meaning this test can be used) for the duration of the COVID-19 declaration under Se ction 564(b)(1) of the Act, 21 U.S.C. section 360bbb-3(b)(1), unless the authorization is terminated or revoked sooner.  Performed at [facility], [place] [address] [place], [place] [address]   MRSA Next Gen by PCR, Nasal     Status: None   Collection Time: 05/16/21 11:24 AM   Specimen: Nasal Mucosa; Nasal Swab  Result Value Ref Range Status   MRSA by PCR Next Gen NOT DETECTED NOT DETECTED Final    Comment: (NOTE) The GeneXpert MRSA Assay (FDA approved for NASAL specimens only), is one component of a comprehensive MRSA colonization surveillance program. It is not intended to diagnose MRSA infection nor to guide or monitor treatment for MRSA infections. Test performance is not FDA approved in patients less than 85 years old. Performed at [facility], [place] [address], [place],  [address]        Radiology Studies: DG Abdomen Acute W/Chest  Result Date: 05/15/2021 CLINICAL DATA:  Nausea and vomiting for 2 days. EXAM: DG ABDOMEN ACUTE WITH 1 VIEW CHEST COMPARISON:  None. FINDINGS: There is no evidence of dilated bowel loops or free  intraperitoneal air. A moderate amount of stool is seen within the descending colon. No radiopaque calculi or other significant radiographic abnormality is seen. The cardiac silhouette is mildly enlarged and unchanged in size. Both lungs are clear. A chronic deformity is seen involving the distal right clavicle. IMPRESSION: Negative abdominal radiographs.  No acute cardiopulmonary disease. Electronically Signed   By: Virgina Norfolk M.D.   On: 05/15/2021 16:37    Scheduled Meds:  amLODipine  10 mg Oral q morning   atorvastatin  40 mg Oral q1800   carvedilol  25 mg Oral BID WC   clopidogrel  75 mg Oral q morning   feeding supplement  237 mL Oral TID BM   FLUoxetine  10 mg Oral Daily   insulin aspart  0-9 Units Subcutaneous Q4H   metoCLOPramide (REGLAN) injection  10 mg Intravenous Q8H   multivitamin with minerals  1 tablet Oral Daily   paliperidone  6 mg Oral q morning   pantoprazole (PROTONIX) IV  40 mg Intravenous QHS   polyethylene glycol  17 g Oral BID   senna  2 tablet Oral QHS   Continuous Infusions:  dextrose 5 % and 0.45 % NaCl with KCl 40 mEq/L 30 mL/hr at 05/16/21 2145   promethazine (PHENERGAN) injection (IM or IVPB)       LOS: 1 day   Time spent: 94mn  Korrine Sicard C Himmat Enberg, DO [facility]  If 7PM-7AM, please contact night-coverage [email or web address]  05/17/2021, 7:44 AM

## 2021-05-17 NOTE — Progress Notes (Signed)
Subjective: Some nausea and vomiting.  Objective: Vital signs in last 24 hours: Temp:  [98.2 F (36.8 C)-99.4 F (37.4 C)] 98.5 F (36.9 C) (06/30 0717) Pulse Rate:  [76-95] 88 (06/30 0717) Resp:  [16-25] 20 (06/30 0717) BP: (154-189)/(82-121) 156/104 (06/30 0717) SpO2:  [95 %-100 %] 97 % (06/30 0742) Weight change:     PE: GEN:  Dysarthric, right droop, right hemiparesis  Lab Results: CBC    Component Value Date/Time   WBC 7.7 05/17/2021 0321   RBC 4.57 05/17/2021 0321   HGB 12.3 (L) 05/17/2021 0321   HCT 38.6 (L) 05/17/2021 0321   PLT 206 05/17/2021 0321   MCV 84.5 05/17/2021 0321   MCH 26.9 05/17/2021 0321   MCHC 31.9 05/17/2021 0321   RDW 13.2 05/17/2021 0321   LYMPHSABS 2.2 05/16/2021 0057   MONOABS 0.8 05/16/2021 0057   EOSABS 0.2 05/16/2021 0057   BASOSABS 0.0 05/16/2021 0057  CMP     Component Value Date/Time   NA 138 05/17/2021 0321   K 3.1 (L) 05/17/2021 0321   CL 99 05/17/2021 0321   CO2 27 05/17/2021 0321   GLUCOSE 111 (H) 05/17/2021 0321   BUN 9 05/17/2021 0321   CREATININE 1.17 05/17/2021 0321   CALCIUM 9.4 05/17/2021 0321   PROT 7.0 05/16/2021 0057   ALBUMIN 3.9 05/16/2021 0057   AST 14 (L) 05/16/2021 0057   ALT 7 05/16/2021 0057   ALKPHOS 71 05/16/2021 0057   BILITOT 0.8 05/16/2021 0057   GFRNONAA >60 05/17/2021 0321   GFRAA >60 05/16/2020 0609    Assessment:   Polysubstance abuse. Multifactorial CHF. Recurrent strokes with dysarthria. Nausea/vomiting by report?  Sounds more oropharyngeal in nature to me. Chronic anticoagulation, clopidigrel.  Plan:   I think this is oropharyngeal in nature.  If EGD is desired by Speech Therapy and/or primary team, we are happy to do it (albeit very low yield), but would require patient to be off Plavix at least 2-3 days. [facility] will follow.   Landry Dyke 05/17/2021, 10:46 AM   Cell [phone] If no answer or after 5 PM call [phone]

## 2021-05-17 NOTE — Progress Notes (Signed)
  Speech Language Pathology Treatment: Dysphagia  Patient Details Name: [name] MRN: [id number] DOB: February 27, 1961 Today's Date: 05/17/2021 Time: [id number] SLP Time Calculation (min) (ACUTE ONLY): 14 min  Assessment / Plan / Recommendation Clinical Impression  Pt was seen for dysphagia treatment following reports from RN of pt coughing with gelatin. Pt was alert upon SLP's arrival with right-sided anterior spillage of secretions. Pt denied emesis today. Pt tolerated thin liquids via straw, puree, regular texture solids and gelatin without overt s/sx of aspiration. Mastication was prolonged and oral residue was cleared with a liquid wash. Considering pt's episode today and history of dysphagia, a modified barium swallow study is recommended to more objectively assess swallow function and it will be planned for 7/1. [name] diet will be modified to dysphagia 1 and thin liquids until it is completed. SLP will continue to follow pt.    HPI HPI: Pt is a 60 y.o. male with ongoing nausea and vomiting after recent discharge on 05/14/21. During recent admission pt noted to have coffee-ground emesis felt to be secondary to Mallory-Weiss tear GI consulted but recommended holding off on EGD. Palliative Care: full scope, full code. 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 MBS 04/19/20:  Oropharyngeal dysphagia with left labial weakness, and a pharyngeal delay; regular texture diet and thin liquids recommended      SLP Plan  Continue with current plan of care       Recommendations  Diet recommendations: Dysphagia 1 (puree);Thin liquid Liquids provided via: Cup;Straw Medication Administration: Whole meds with puree Supervision: Staff to assist with self feeding;Full supervision/cueing for compensatory strategies Compensations: Small sips/bites;Slow rate                 Oral Care Recommendations: Oral care BID Follow up Recommendations:  (TBD) SLP Visit Diagnosis: Dysphagia, unspecified (R13.10) Plan: Continue with current plan of care       [name], [name], [name] Office number [phone] Pager Cash 05/17/2021, 2:00 PM

## 2021-05-17 NOTE — Progress Notes (Signed)
Gave some apple juice to the patient and he had no trouble. Gave some jello and he started coughing vigorously. MD made aware.

## 2021-05-18 ENCOUNTER — Inpatient Hospital Stay (HOSPITAL_COMMUNITY): Payer: Medicaid Other

## 2021-05-18 DIAGNOSIS — F209 Schizophrenia, unspecified: Secondary | ICD-10-CM | POA: Diagnosis not present

## 2021-05-18 DIAGNOSIS — R112 Nausea with vomiting, unspecified: Secondary | ICD-10-CM | POA: Diagnosis not present

## 2021-05-18 DIAGNOSIS — I10 Essential (primary) hypertension: Secondary | ICD-10-CM | POA: Diagnosis not present

## 2021-05-18 DIAGNOSIS — I5042 Chronic combined systolic (congestive) and diastolic (congestive) heart failure: Secondary | ICD-10-CM | POA: Diagnosis not present

## 2021-05-18 LAB — CBC
HCT: 36.2 % — ABNORMAL LOW (ref 39.0–52.0)
Hemoglobin: 11.6 g/dL — ABNORMAL LOW (ref 13.0–17.0)
MCH: 27.4 pg (ref 26.0–34.0)
MCHC: 32 g/dL (ref 30.0–36.0)
MCV: 85.4 fL (ref 80.0–100.0)
Platelets: 209 10*3/uL (ref 150–400)
RBC: 4.24 MIL/uL (ref 4.22–5.81)
RDW: 13.4 % (ref 11.5–15.5)
WBC: 7.1 10*3/uL (ref 4.0–10.5)
nRBC: 0 % (ref 0.0–0.2)

## 2021-05-18 LAB — BASIC METABOLIC PANEL
Anion gap: 8 (ref 5–15)
BUN: 11 mg/dL (ref 6–20)
CO2: 30 mmol/L (ref 22–32)
Calcium: 9.1 mg/dL (ref 8.9–10.3)
Chloride: 99 mmol/L (ref 98–111)
Creatinine, Ser: 1.23 mg/dL (ref 0.61–1.24)
GFR, Estimated: >60 mL/min (ref >60)
Glucose, Bld: 112 mg/dL — ABNORMAL HIGH (ref 70–99)
Potassium: 3 mmol/L — ABNORMAL LOW (ref 3.5–5.1)
Sodium: 137 mmol/L (ref 135–145)

## 2021-05-18 LAB — GLUCOSE, CAPILLARY
Glucose-Capillary: 104 mg/dL — ABNORMAL HIGH (ref 70–99)
Glucose-Capillary: 105 mg/dL — ABNORMAL HIGH (ref 70–99)
Glucose-Capillary: 119 mg/dL — ABNORMAL HIGH (ref 70–99)
Glucose-Capillary: 123 mg/dL — ABNORMAL HIGH (ref 70–99)
Glucose-Capillary: 124 mg/dL — ABNORMAL HIGH (ref 70–99)
Glucose-Capillary: 138 mg/dL — ABNORMAL HIGH (ref 70–99)
Glucose-Capillary: 139 mg/dL — ABNORMAL HIGH (ref 70–99)

## 2021-05-18 MED ORDER — POTASSIUM CHLORIDE 10 MEQ/100ML IV SOLN
10.0000 meq | INTRAVENOUS | Status: AC
  Administered 2021-05-18 (×4): 10 meq via INTRAVENOUS
  Filled 2021-05-18 (×2): qty 100

## 2021-05-18 NOTE — Progress Notes (Signed)
PROGRESS NOTE    [name]  [id number] DOB: 10/02/1961 DOA: 05/15/2021 PCP: Boyce Medici, FNP   Brief Narrative:  [name] is a 60 y.o. 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 Presented with ongoing nausea and vomiting. Was recently admitted  for persistent nausea vomiting and constipation discharged on 05/14/2021 with resolution of symptoms. During patient's last hospitalization he had a CT of the abdomen pelvis which was negative and a head CT without acute findings.  Patient had been taking Zofran at his facility but it was not helping. During his recent admission was noted to have coffee-ground emesis felt to be secondary to Mallory-Weiss tear GI consulted not recommended holding off on EGD.  Initially nausea and vomiting resolved although it has resumed once patient went back to nursing home facility.  Hemoglobin did remain stable and he was added on PPI.  Patient representing with nausea vomiting extremely poor p.o. intake from facility for reevaluation, and GI to be consulted for their expertise.  Assessment & Plan:   Active Problems:   Hyperlipidemia associated with type 2 diabetes mellitus (HCC)   Hypokalemia   Schizophrenia (HCC)   History of CVA with residual deficit   Chronic combined systolic (congestive) and diastolic (congestive) heart failure (HCC)   Benign essential HTN   Intractable nausea and vomiting   Intractable vomiting with nausea, unspecified vomiting type    Intractable nausea and vomiting Rule out stricture/diverticulum -Patient continues to have recurrent episodes of vomiting again this morning despite doing well with speech evaluation yesterday afternoon, barium swallow pending for further evaluation -GI consulted, appreciate insight and recs -Recent history of  coffee-ground emesis appears to have resolved -today only vomiting food and water that he had just taken in  History of multiple CVAs with residual right-sided weakness-okay to resume antiplatelet agents per GI, he is also now on a PPI  Essential hypertension-continue home medications  Hyperlipidemia-continue statin History of schizophrenia-continue home medications Chronic combined CHF-appears euvolemic, continue home medications  Type 2 diabetes mellitus-continue home medications Constipation-resolved with aggressive bowel regimen, continue on discharge  DVT prophylaxis: None given previous episodes of hematemesis coffee-ground emesis Code Status: Full Family Communication: None present  Status is: Inpatient  Dispo: The patient is from: Facility              Anticipated d/c is to: Same              Anticipated d/c date is: 48 to 72 hours              Patient currently not medically stable for discharge  Consultants:  GI, [facility]  Procedures:  Pending above  Antimicrobials:  Not currently indicated  Subjective: Recurrent episode of vomiting this morning with clears, patient requesting food but we discussed he is high risk for aspiration and will need to follow-up with speech first.  Objective: Vitals:   05/17/21 1948 05/18/21 0012 05/18/21 0535 05/18/21 0730  BP: (!) 151/105 (!) 182/122 (!) 186/104   Pulse: 78 77 70   Resp: '15 19 15   '$ Temp: 97.8 F (36.6 C) 98.2 F (36.8 C) 98.7 F (37.1 C)   TempSrc:  Oral Oral   SpO2: 98% 99% 100% 97%  Weight:      Height:        Intake/Output Summary (Last 24 hours) at 05/18/2021 0746 Last data filed at  05/18/2021 0550 Gross per 24 hour  Intake 354 ml  Output 400 ml  Net -46 ml    Filed Weights   05/15/21 1421  Weight: 79.4 kg    Examination:  General exam: Appears calm and comfortable  Respiratory system: Clear to auscultation. Respiratory effort normal. Cardiovascular system: S1 & S2 heard, RRR. No JVD, murmurs, rubs,  gallops or clicks. No pedal edema. Gastrointestinal system: Abdomen is nondistended, soft and nontender. No organomegaly or masses felt. Normal bowel sounds heard. Central nervous system: Alert and oriented. No focal neurological deficits. Extremities: Symmetric 5 x 5 power. Skin: No rashes, lesions  Data Reviewed: I have personally reviewed following labs and imaging studies  CBC: Recent Labs  Lab 05/12/21 1407 05/13/21 0207 05/14/21 0744 05/15/21 1431 05/16/21 0057 05/17/21 0321 05/18/21 0151  WBC 8.5   < > 9.0 8.1 8.0 7.7 7.1  NEUTROABS 5.8  --   --  5.1 4.8  --   --   HGB 13.9   < > 12.5* 13.1 12.1* 12.3* 11.6*  HCT 42.5   < > 39.1 41.3 37.7* 38.6* 36.2*  MCV 84.5   < > 85.0 85.2 85.1 84.5 85.4  PLT 219   < > 196 224 192 206 209   < > = values in this interval not displayed.    Basic Metabolic Panel: Recent Labs  Lab 05/13/21 0539 05/15/21 1431 05/16/21 0057 05/16/21 0535 05/17/21 0321 05/18/21 0151  NA 139 139 138  138  --  138 137  K 3.0* 3.0* 3.1*  3.1*  --  3.1* 3.0*  CL 98 98 101  102  --  99 99  CO2 '29 29 29  28  '$ --  27 30  GLUCOSE 105* 115* 107*  107*  --  111* 112*  BUN 28* '10 13  11  '$ --  9 11  CREATININE 1.27* 1.21 1.20  1.18  --  1.17 1.23  CALCIUM 9.5 9.4 8.9  8.9  --  9.4 9.1  MG  --   --  1.7 1.6*  --   --   PHOS  --   --  3.0 2.7  --   --     GFR: Estimated Creatinine Clearance: 69.7 mL/min (by C-G formula based on SCr of 1.23 mg/dL). Liver Function Tests: Recent Labs  Lab 05/12/21 1407 05/15/21 1431 05/16/21 0057  AST 18 14* 14*  ALT '8 7 7  '$ ALKPHOS 89 82 71  BILITOT 1.1 1.0 0.8  PROT 8.8* 7.9 7.0  ALBUMIN 4.8 4.3 3.9    Recent Labs  Lab 05/12/21 1407 05/15/21 1431  LIPASE 46 37    No results for input(s): AMMONIA in the last 168 hours. Coagulation Profile: No results for input(s): INR, PROTIME in the last 168 hours. Cardiac Enzymes: Recent Labs  Lab 05/16/21 0057  CKTOTAL 110    BNP (last 3 results) No  results for input(s): PROBNP in the last 8760 hours. HbA1C: Recent Labs    05/16/21 0057  HGBA1C 6.1*    CBG: Recent Labs  Lab 05/17/21 1111 05/17/21 1653 05/17/21 1939 05/18/21 0020 05/18/21 0604  GLUCAP 124* 99 139* 104* 124*    Lipid Profile: No results for input(s): CHOL, HDL, LDLCALC, TRIG, CHOLHDL, LDLDIRECT in the last 72 hours. Thyroid Function Tests: Recent Labs    05/16/21 0057  TSH 2.868    Anemia Panel: No results for input(s): VITAMINB12, FOLATE, FERRITIN, TIBC, IRON, RETICCTPCT in the last 72 hours. Sepsis Labs: Recent Labs  Lab 05/16/21 0057  LATICACIDVEN 0.9     Recent Results (from the past 240 hour(s))  Resp Panel by RT-PCR (Flu A&B, Covid) Nasopharyngeal Swab     Status: None   Collection Time: 05/13/21  3:30 AM   Specimen: Nasopharyngeal Swab; Nasopharyngeal(NP) swabs in vial transport medium  Result Value Ref Range Status   SARS Coronavirus 2 by RT PCR NEGATIVE NEGATIVE Final    Comment: (NOTE) SARS-CoV-2 target nucleic acids are NOT DETECTED.  The SARS-CoV-2 RNA is generally detectable in upper respiratory specimens during the acute phase of infection. The lowest concentration of SARS-CoV-2 viral copies this assay can detect is 138 copies/mL. A negative result does not preclude SARS-Cov-2 infection and should not be used as the sole basis for treatment or other patient management decisions. A negative result may occur with  improper specimen collection/handling, submission of specimen other than nasopharyngeal swab, presence of viral mutation(s) within the areas targeted by this assay, and inadequate number of viral copies(<138 copies/mL). A negative result must be combined with clinical observations, patient history, and epidemiological information. The expected result is Negative.  Fact Sheet for Patients:  EntrepreneurPulse.com.au  Fact Sheet for Healthcare Providers:   IncredibleEmployment.be  This test is no t yet approved or cleared by the [place] FDA and  has been authorized for detection and/or diagnosis of SARS-CoV-2 by FDA under an Emergency Use Authorization (EUA). This EUA will remain  in effect (meaning this test can be used) for the duration of the COVID-19 declaration under Section 564(b)(1) of the Act, 21 U.S.C.section 360bbb-3(b)(1), unless the authorization is terminated  or revoked sooner.       Influenza A by PCR NEGATIVE NEGATIVE Final   Influenza B by PCR NEGATIVE NEGATIVE Final    Comment: (NOTE) The Xpert Xpress SARS-CoV-2/FLU/RSV plus assay is intended as an aid in the diagnosis of influenza from Nasopharyngeal swab specimens and should not be used as a sole basis for treatment. Nasal washings and aspirates are unacceptable for Xpert Xpress SARS-CoV-2/FLU/RSV testing.  Fact Sheet for Patients: EntrepreneurPulse.com.au  Fact Sheet for Healthcare Providers: IncredibleEmployment.be  This test is not yet approved or cleared by the [place] FDA and has been authorized for detection and/or diagnosis of SARS-CoV-2 by FDA under an Emergency Use Authorization (EUA). This EUA will remain in effect (meaning this test can be used) for the duration of the COVID-19 declaration under Section 564(b)(1) of the Act, 21 U.S.C. section 360bbb-3(b)(1), unless the authorization is terminated or revoked.  Performed at [facility], [place] [address] [place], [place] [address]   SARS CORONAVIRUS 2 (TAT 6-24 HRS) Nasopharyngeal Nasopharyngeal Swab     Status: None   Collection Time: 05/15/21 10:14 PM   Specimen: Nasopharyngeal Swab  Result Value Ref Range Status   SARS Coronavirus 2 NEGATIVE NEGATIVE Final    Comment: (NOTE) SARS-CoV-2 target nucleic acids are NOT DETECTED.  The SARS-CoV-2 RNA is generally detectable in upper and lower respiratory specimens  during the acute phase of infection. Negative results do not preclude SARS-CoV-2 infection, do not rule out co-infections with other pathogens, and should not be used as the sole basis for treatment or other patient management decisions. Negative results must be combined with clinical observations, patient history, and epidemiological information. The expected result is Negative.  Fact Sheet for Patients: SugarRoll.be  Fact Sheet for Healthcare Providers: https://www.woods-mathews.com/  This test is not yet approved or cleared by the [place] FDA and  has been authorized for detection  and/or diagnosis of SARS-CoV-2 by FDA under an Emergency Use Authorization (EUA). This EUA will remain  in effect (meaning this test can be used) for the duration of the COVID-19 declaration under Se ction 564(b)(1) of the Act, 21 U.S.C. section 360bbb-3(b)(1), unless the authorization is terminated or revoked sooner.  Performed at [facility], [place] [address] [place], [place] [address]   MRSA Next Gen by PCR, Nasal     Status: None   Collection Time: 05/16/21 11:24 AM   Specimen: Nasal Mucosa; Nasal Swab  Result Value Ref Range Status   MRSA by PCR Next Gen NOT DETECTED NOT DETECTED Final    Comment: (NOTE) The GeneXpert MRSA Assay (FDA approved for NASAL specimens only), is one component of a comprehensive MRSA colonization surveillance program. It is not intended to diagnose MRSA infection nor to guide or monitor treatment for MRSA infections. Test performance is not FDA approved in patients less than 52 years old. Performed at [facility], [place] [address] [place], [place] [address]        Radiology Studies: No results found.  Scheduled Meds:  amLODipine  10 mg Oral q morning   atorvastatin  40 mg Oral q1800   carvedilol  25 mg Oral BID WC   clopidogrel  75 mg Oral q morning   feeding supplement  237 mL Oral TID BM    FLUoxetine  10 mg Oral Daily   insulin aspart  0-9 Units Subcutaneous Q4H   metoCLOPramide (REGLAN) injection  10 mg Intravenous Q8H   multivitamin with minerals  1 tablet Oral Daily   paliperidone  6 mg Oral q morning   pantoprazole (PROTONIX) IV  40 mg Intravenous QHS   polyethylene glycol  17 g Oral BID   senna  2 tablet Oral QHS   Continuous Infusions:  promethazine (PHENERGAN) injection (IM or IVPB)       LOS: 2 days   Time spent: 89mn  Adyline Huberty C Zaylia Riolo, DO [facility]  If 7PM-7AM, please contact night-coverage [email or web address]  05/18/2021, 7:46 AM

## 2021-05-18 NOTE — Progress Notes (Signed)
   RE:  [name]       Date of Birth: [birth year]      Date:  05/18/21        To Whom It May Concern:  Please be advised that the above-named patient will require a short-term nursing home stay - anticipated 30 days or less for rehabilitation and strengthening.  The plan is for return home.                 MD signature                Date

## 2021-05-18 NOTE — Plan of Care (Signed)

## 2021-05-18 NOTE — Progress Notes (Signed)
Modified Barium Swallow Progress Note  Patient Details  Name: [name] MRN: [id number] Date of Birth: [birth year]  Today's Date: 05/18/2021  Modified Barium Swallow completed.  Full report located under Chart Review in the Imaging Section.  Brief recommendations include the following:  Clinical Impression  Pt presents with oropharyngeal dysphagia characterized by reduced labial seal, reduced bolus cohesion, reduced posterior bolus propulsion, weak bolus manipulation, prolonged mastication, and a pharyngeal delay. He demonstrated right-sided anterior spillage with all liquids, reduced labial stripping, residue in the right lateral sulcus, premature spillage to the valleculae and pyriform sinuses, penetration (PAS 5) and aspiration (PAS 8) with thin liquids. The swallow was often triggered with the majority of the bolus at the level of the pyriform sinuses. The initial bolus of thin liquids via cup was noted to prematurely spill to the pyriform sinuses and into the larynx. The penetrated bolus remained the larynx despite cues to swallow/cough and was ultimately aspirated when the next bolus of liquid was swallowed. Subsequent instances of aspiration with thin liquids (via straw) were trace and occurred after deglutition of penetrated material. Coughing was inconsistently noted following aspiration of thin liquids, but was ineffective. Pt was unable to effectively demonstrate compensatory strategies. A dysphagia 1 diet with nectar thick liquids is recommended at this time and SLP will follow for treatment.   Swallow Evaluation Recommendations       SLP Diet Recommendations: Dysphagia 1 (Puree) solids;Nectar thick liquid   Liquid Administration via: Cup;Straw   Medication Administration: Crushed with puree   Supervision: Full assist for feeding;Full supervision/cueing for compensatory strategies   Compensations: Small sips/bites;Slow rate   Postural Changes: Seated upright at 90  degrees;Remain semi-upright after after feeds/meals (Comment)   Oral Care Recommendations: Oral care BID      Axel Frisk I. Hardin Negus, [place], [place] Office number [phone] Pager [phone]  Horton Marshall 05/18/2021,3:18 PM

## 2021-05-18 NOTE — Plan of Care (Signed)
Advise seeing how patient does with nausea/vomiting with Speech Therapy over the next few days.  If felt EGD needed, please let us know mindful that Plavix will need to be held 2-3 days pre-procedure.  [facility] will follow along at a distance.  Thank you for the consultation.

## 2021-05-18 NOTE — TOC Progression Note (Addendum)
Transition of Care [facility]) - Progression Note    Patient Details  Name: [name] MRN: [id number] Date of Birth: 10/28/1961  Transition of Care [facility]) CM/SW Contact  Joanne Chars, LCSW Phone Number: 05/18/2021, 10:23 AM  Clinical Narrative:   CSW spoke with pt in room.  Pt able to communicate and answer questions today, still with difficulty, but understandable.  Pt confirms that daughter is legal guardian, also in agreement that he would like to go to different facility than [facility] at discharge.  CSW asked about information needed for new PASSR.  Pt reports he has been substance free for 5 years.  Pt also reports that he is not currently on medication for his mental health diagnosis and says he has been off meds for 15 years.    CSW reached out to pt daughter to confirm the above information and to make sure she received email for guardianship document.    CSW confirmed with Janie at [facility].  Pt has been in long term bed there for the past month.  She is not sure why daughter is saying he is rehab.  1345: level 2 PASSR docs uploaded.  Pt sent out in hub for SNF.  1600: Pt daughter came to hospital, met with CSW.  She clarified that she is pt POA but has never been to court and is not legal guardian.  She looked for POA on her phone but could not find it.  Discussed LTC vs SNF rehab and she is wanting rehab.  CSW also spoke with Alexa/PT who worked with pt again feels he is appropriate for SNF rehab.      Expected Discharge Plan: [place] Barriers to Discharge: Continued Medical Work up, SNF Pending bed offer  Expected Discharge Plan and Services Expected Discharge Plan: [place] Choice: [place] arrangements for the past 2 months: [place]                                       Social Determinants of Health (SDOH) Interventions    Readmission Risk  Interventions No flowsheet data found.

## 2021-05-18 NOTE — Progress Notes (Signed)
Physical Therapy Treatment Patient Details Name: [name] MRN: [id number] DOB: 1961/06/06 Today's Date: 05/18/2021    History of Present Illness 60 y.o. male presenting to ED 6/28 from [place] with intractable N/V. Patient with recent admission 05/14/2021 with similar complaint. GI consult not recommended at that time. PMHx significant for multiple CVA's with residual R hemiparesis, dysarthria and neurocognitive impairment, CHF, DMII, HTN, schizoaffective disorder, chronic L hip pain and polysubstance abuse.    PT Comments    Pt reporting vomiting all day, but agreeable to mobility. Pt demonstrating improved initiation of mobility and active participation in session, participating well in LE exercises and seated balance tasks. Mobility not progressed beyond EOB secondary to frequent bouts of N/V, NT aware and states pt has been vomiting all day. PT feels pt is appropriate for SNF for rehabilitation, has room for improving mobility and decreasing caregiver burden. Will continue to follow acutely.    Follow Up Recommendations  SNF     Equipment Recommendations  Other (comment) (defer)    Recommendations for Other Services       Precautions / Restrictions Precautions Precautions: Fall Precaution Comments: R hemiparesis, significant debility, risk of skin breakdown, frequent dry heaving and vomiting Restrictions Weight Bearing Restrictions: No    Mobility  Bed Mobility Overal bed mobility: Needs Assistance Bed Mobility: Supine to Sit;Sit to Supine     Supine to sit: Max assist Sit to supine: Max assist   General bed mobility comments: max assist for trunk and LE translation to/from EOB, boost up in bed upon return to supine. Pt initiating LE movement to EOB and using RUE for trunk elevation via HHA.    Transfers                 General transfer comment: NT - n/v  Ambulation/Gait                 Stairs             Wheelchair Mobility     Modified Rankin (Stroke Patients Only) Modified Rankin (Stroke Patients Only) Pre-Morbid Rankin Score: Severe disability Modified Rankin: Severe disability     Balance Overall balance assessment: Needs assistance Sitting-balance support: Feet supported;Single extremity supported Sitting balance-Leahy Scale: Fair Sitting balance - Comments: periods of unsupported sitting, posterior leaning with LE movement requiring occasional min PT assist to correct. 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 Exercises - Lower Extremity Long Arc Quad: AAROM;Both;10 reps;Seated    General Comments General comments (skin integrity, edema, etc.): dry heaving, n/v during session, NT aware      Pertinent Vitals/Pain Pain Assessment: Faces Faces Pain Scale: Hurts little more Pain Location: all over, generalized with movement Pain Descriptors / Indicators: Discomfort;Aching;Sore Pain Intervention(s): Limited activity within patient's tolerance;Repositioned;Monitored during session    Home Living  Prior Function            PT Goals (current goals can now be found in the care plan section) Acute Rehab PT Goals Patient Stated Goal: go to rehab PT Goal Formulation: With patient Time For Goal Achievement: 05/30/21 Potential to Achieve Goals: Good Progress towards PT goals: Progressing toward goals    Frequency    Min 2X/week      PT Plan Current plan remains appropriate    Co-evaluation              AM-PAC PT "6  Clicks" Mobility   Outcome Measure  Help needed turning from your back to your side while in a flat bed without using bedrails?: Total Help needed moving from lying on your back to sitting on the side of a flat bed without using bedrails?: Total Help needed moving to and from a bed to a chair (including a wheelchair)?: Total Help needed standing up from a chair using your arms (e.g., wheelchair or bedside chair)?: Total Help needed to walk in hospital room?: Total Help needed climbing 3-5 steps with a railing? : Total 6 Click Score: 6    End of Session   Activity Tolerance: Other (comment) (limited by nausea/vomiting) Patient left: in bed;with bed alarm set;with call bell/phone within reach Nurse Communication: Mobility status PT Visit Diagnosis: Other abnormalities of gait and mobility (R26.89);Muscle weakness (generalized) (M62.81)     Time: [id number] PT Time Calculation (min) (ACUTE ONLY): 22 min  Charges:  $Therapeutic Activity: 8-22 mins                     Stacie Glaze, PT DPT Acute Rehabilitation Services Pager [phone]  Office [phone]    Roxine Caddy E Ruffin Pyo 05/18/2021, 4:25 PM

## 2021-05-18 NOTE — Plan of Care (Signed)
  Problem: Education: Goal: Knowledge of General Education information will improve Description Including pain rating scale, medication(s)/side effects and non-pharmacologic comfort measures Outcome: Progressing   Problem: Health Behavior/Discharge Planning: Goal: Ability to manage health-related needs will improve Outcome: Progressing   

## 2021-05-19 DIAGNOSIS — Z515 Encounter for palliative care: Secondary | ICD-10-CM | POA: Diagnosis not present

## 2021-05-19 LAB — BASIC METABOLIC PANEL
Anion gap: 11 (ref 5–15)
BUN: 10 mg/dL (ref 6–20)
CO2: 28 mmol/L (ref 22–32)
Calcium: 9.4 mg/dL (ref 8.9–10.3)
Chloride: 99 mmol/L (ref 98–111)
Creatinine, Ser: 1.14 mg/dL (ref 0.61–1.24)
GFR, Estimated: >60 mL/min (ref >60)
Glucose, Bld: 135 mg/dL — ABNORMAL HIGH (ref 70–99)
Potassium: 3.3 mmol/L — ABNORMAL LOW (ref 3.5–5.1)
Sodium: 138 mmol/L (ref 135–145)

## 2021-05-19 LAB — CBC
HCT: 38.6 % — ABNORMAL LOW (ref 39.0–52.0)
Hemoglobin: 12.7 g/dL — ABNORMAL LOW (ref 13.0–17.0)
MCH: 27.6 pg (ref 26.0–34.0)
MCHC: 32.9 g/dL (ref 30.0–36.0)
MCV: 83.9 fL (ref 80.0–100.0)
Platelets: 207 10*3/uL (ref 150–400)
RBC: 4.6 MIL/uL (ref 4.22–5.81)
RDW: 13.3 % (ref 11.5–15.5)
WBC: 9.6 10*3/uL (ref 4.0–10.5)
nRBC: 0 % (ref 0.0–0.2)

## 2021-05-19 LAB — GLUCOSE, CAPILLARY
Glucose-Capillary: 105 mg/dL — ABNORMAL HIGH (ref 70–99)
Glucose-Capillary: 113 mg/dL — ABNORMAL HIGH (ref 70–99)
Glucose-Capillary: 120 mg/dL — ABNORMAL HIGH (ref 70–99)
Glucose-Capillary: 125 mg/dL — ABNORMAL HIGH (ref 70–99)
Glucose-Capillary: 132 mg/dL — ABNORMAL HIGH (ref 70–99)
Glucose-Capillary: 133 mg/dL — ABNORMAL HIGH (ref 70–99)
Glucose-Capillary: 140 mg/dL — ABNORMAL HIGH (ref 70–99)

## 2021-05-19 MED ORDER — CHLORTHALIDONE 50 MG PO TABS
50.0000 mg | ORAL_TABLET | Freq: Every day | ORAL | Status: DC
  Administered 2021-05-19 – 2021-05-23 (×5): 50 mg via ORAL
  Filled 2021-05-19 (×5): qty 1

## 2021-05-19 MED ORDER — LOSARTAN POTASSIUM 25 MG PO TABS
75.0000 mg | ORAL_TABLET | Freq: Every day | ORAL | Status: DC
  Administered 2021-05-19 – 2021-05-23 (×5): 75 mg via ORAL
  Filled 2021-05-19 (×6): qty 3

## 2021-05-19 MED ORDER — HYDRALAZINE HCL 10 MG PO TABS
10.0000 mg | ORAL_TABLET | Freq: Three times a day (TID) | ORAL | Status: DC
  Filled 2021-05-19: qty 1

## 2021-05-19 MED ORDER — GUAIFENESIN-DM 100-10 MG/5ML PO SYRP
15.0000 mL | ORAL_SOLUTION | Freq: Three times a day (TID) | ORAL | Status: DC | PRN
  Administered 2021-05-20 – 2021-05-23 (×2): 15 mL via ORAL
  Filled 2021-05-19 (×3): qty 15

## 2021-05-19 NOTE — Progress Notes (Signed)
PROGRESS NOTE    [name]  [id number] DOB: [birth year] DOA: 05/15/2021 PCP: Boyce Medici, FNP   Brief Narrative:  Chaos Poke is a 60 y.o. 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 Presented with ongoing nausea and vomiting. Was recently admitted  for persistent nausea vomiting and constipation discharged on 05/14/2021 with resolution of symptoms. During patient's last hospitalization he had a CT of the abdomen pelvis which was negative and a head CT without acute findings.  Patient had been taking Zofran at his facility but it was not helping. During his recent admission was noted to have coffee-ground emesis felt to be secondary to Mallory-Weiss tear GI consulted not recommended holding off on EGD.  Initially nausea and vomiting resolved although it has resumed once patient went back to nursing home facility.  Hemoglobin did remain stable and he was added on PPI.  Patient representing with nausea vomiting extremely poor p.o. intake from facility for reevaluation, and GI to be consulted for their expertise.  Assessment & Plan:   Active Problems:   Hyperlipidemia associated with type 2 diabetes mellitus (HCC)   Hypokalemia   Schizophrenia (HCC)   History of CVA with residual deficit   Chronic combined systolic (congestive) and diastolic (congestive) heart failure (HCC)   Benign essential HTN   Intractable nausea and vomiting   Intractable vomiting with nausea, unspecified vomiting type  Intractable nausea and vomiting secondary to dysphagia Rule out stricture/diverticulum -Continue to advance diet per speech -If able to tolerate p.o. appropriately over the next 48 hours will discharge back to facility, if unable to tolerate appropriate caloric intake will discuss PEG tube placement with family -  patient understands the risks and is agreeable only if completely necessary -Barium swallow remarkable for spillage and aspiration:       SLP Diet Recommendations: Dysphagia 1 (Puree) solids;Nectar thick liquid      Liquid Administration via: Cup;Straw      Medication Administration: Crushed with puree      Supervision: Full assist for feeding;Full supervision/cueing for compensatory strategies      Compensations: Small sips/bites;Slow rate      Postural Changes: Seated upright at 90 degrees;Remain semi-upright after after feeds/meals (Comment)      Oral Care Recommendations: Oral care BID  History of multiple CVAs with residual right-sided weakness-okay to resume antiplatelet agents per GI, he is also now on a PPI  Essential hypertension-continue home medications  Hyperlipidemia-continue statin History of schizophrenia-continue home medications Chronic combined CHF-appears euvolemic, continue home medications  Type 2 diabetes mellitus-continue home medications Constipation-resolved with aggressive bowel regimen, continue on discharge  DVT prophylaxis: None given previous episodes of hematemesis coffee-ground emesis Code Status: Full Family Communication: None present  Status is: Inpatient  Dispo: The patient is from: Facility              Anticipated d/c is to: Same              Anticipated d/c date is: 48 to 72 hours              Patient currently not medically stable for discharge  Consultants:  GI, [facility]  Procedures:  Pending above  Antimicrobials:  Not currently indicated  Subjective: No acute issues or events overnight, continue advance diet as tolerated, review of systems limited given patient's baseline  Objective: Vitals:   05/19/21 0112 05/19/21 0125  05/19/21 0205 05/19/21 0345  BP: (!) 189/111 (!) 167/106 (!) 170/101 (!) 173/101  Pulse:  89 95   Resp:  17 18 (!) 24  Temp:      TempSrc:      SpO2:  99% 100%   Weight:      Height:        Intake/Output  Summary (Last 24 hours) at 05/19/2021 0734 Last data filed at 05/19/2021 [id number] Gross per 24 hour  Intake 200 ml  Output 425 ml  Net -225 ml    Filed Weights   05/15/21 1421  Weight: 79.4 kg    Examination:  General:  Pleasantly resting in bed, No acute distress. HEENT: Right-sided facial droop with oral spillage Neck:  Without mass or deformity.  Trachea is midline. Lungs:  Clear to auscultate bilaterally without rhonchi, wheeze, or rales. Heart:  Regular rate and rhythm.  Without murmurs, rubs, or gallops. Abdomen:  Soft, nontender, nondistended.  Without guarding or rebound. Extremities: Without cyanosis, clubbing, edema Vascular:  Dorsalis pedis and posterior tibial pulses palpable bilaterally. Skin:  Warm and dry, no erythema, no ulcerations.   Data Reviewed: I have personally reviewed following labs and imaging studies  CBC: Recent Labs  Lab 05/12/21 1407 05/13/21 0207 05/15/21 1431 05/16/21 0057 05/17/21 0321 05/18/21 0151 05/19/21 0306  WBC 8.5   < > 8.1 8.0 7.7 7.1 9.6  NEUTROABS 5.8  --  5.1 4.8  --   --   --   HGB 13.9   < > 13.1 12.1* 12.3* 11.6* 12.7*  HCT 42.5   < > 41.3 37.7* 38.6* 36.2* 38.6*  MCV 84.5   < > 85.2 85.1 84.5 85.4 83.9  PLT 219   < > 224 192 206 209 207   < > = values in this interval not displayed.    Basic Metabolic Panel: Recent Labs  Lab 05/15/21 1431 05/16/21 0057 05/16/21 0535 05/17/21 0321 05/18/21 0151 05/19/21 0306  NA 139 138  138  --  138 137 138  K 3.0* 3.1*  3.1*  --  3.1* 3.0* 3.3*  CL 98 101  102  --  99 99 99  CO2 '29 29  28  '$ --  '27 30 28  '$ GLUCOSE 115* 107*  107*  --  111* 112* 135*  BUN '10 13  11  '$ --  '9 11 10  '$ CREATININE 1.21 1.20  1.18  --  1.17 1.23 1.14  CALCIUM 9.4 8.9  8.9  --  9.4 9.1 9.4  MG  --  1.7 1.6*  --   --   --   PHOS  --  3.0 2.7  --   --   --     GFR: Estimated Creatinine Clearance: 75.2 mL/min (by C-G formula based on SCr of 1.14 mg/dL). Liver Function Tests: Recent Labs  Lab  05/12/21 1407 05/15/21 1431 05/16/21 0057  AST 18 14* 14*  ALT '8 7 7  '$ ALKPHOS 89 82 71  BILITOT 1.1 1.0 0.8  PROT 8.8* 7.9 7.0  ALBUMIN 4.8 4.3 3.9    Recent Labs  Lab 05/12/21 1407 05/15/21 1431  LIPASE 46 37    No results for input(s): AMMONIA in the last 168 hours. Coagulation Profile: No results for input(s): INR, PROTIME in the last 168 hours. Cardiac Enzymes: Recent Labs  Lab 05/16/21 0057  CKTOTAL 110    BNP (last 3 results) No results for input(s): PROBNP in the last 8760 hours. HbA1C: No results for input(s): HGBA1C  in the last 72 hours.  CBG: Recent Labs  Lab 05/18/21 1222 05/18/21 1523 05/18/21 1923 05/18/21 2318 05/19/21 0345  GLUCAP 138* 105* 139* 123* 132*    Lipid Profile: No results for input(s): CHOL, HDL, LDLCALC, TRIG, CHOLHDL, LDLDIRECT in the last 72 hours. Thyroid Function Tests: No results for input(s): TSH, T4TOTAL, FREET4, T3FREE, THYROIDAB in the last 72 hours.  Anemia Panel: No results for input(s): VITAMINB12, FOLATE, FERRITIN, TIBC, IRON, RETICCTPCT in the last 72 hours. Sepsis Labs: Recent Labs  Lab 05/16/21 0057  LATICACIDVEN 0.9     Recent Results (from the past 240 hour(s))  Resp Panel by RT-PCR (Flu A&B, Covid) Nasopharyngeal Swab     Status: None   Collection Time: 05/13/21  3:30 AM   Specimen: Nasopharyngeal Swab; Nasopharyngeal(NP) swabs in vial transport medium  Result Value Ref Range Status   SARS Coronavirus 2 by RT PCR NEGATIVE NEGATIVE Final    Comment: (NOTE) SARS-CoV-2 target nucleic acids are NOT DETECTED.  The SARS-CoV-2 RNA is generally detectable in upper respiratory specimens during the acute phase of infection. The lowest concentration of SARS-CoV-2 viral copies this assay can detect is 138 copies/mL. A negative result does not preclude SARS-Cov-2 infection and should not be used as the sole basis for treatment or other patient management decisions. A negative result may occur with  improper  specimen collection/handling, submission of specimen other than nasopharyngeal swab, presence of viral mutation(s) within the areas targeted by this assay, and inadequate number of viral copies(<138 copies/mL). A negative result must be combined with clinical observations, patient history, and epidemiological information. The expected result is Negative.  Fact Sheet for Patients:  EntrepreneurPulse.com.au  Fact Sheet for Healthcare Providers:  IncredibleEmployment.be  This test is no t yet approved or cleared by the [place] FDA and  has been authorized for detection and/or diagnosis of SARS-CoV-2 by FDA under an Emergency Use Authorization (EUA). This EUA will remain  in effect (meaning this test can be used) for the duration of the COVID-19 declaration under Section 564(b)(1) of the Act, 21 U.S.C.section 360bbb-3(b)(1), unless the authorization is terminated  or revoked sooner.       Influenza A by PCR NEGATIVE NEGATIVE Final   Influenza B by PCR NEGATIVE NEGATIVE Final    Comment: (NOTE) The Xpert Xpress SARS-CoV-2/FLU/RSV plus assay is intended as an aid in the diagnosis of influenza from Nasopharyngeal swab specimens and should not be used as a sole basis for treatment. Nasal washings and aspirates are unacceptable for Xpert Xpress SARS-CoV-2/FLU/RSV testing.  Fact Sheet for Patients: EntrepreneurPulse.com.au  Fact Sheet for Healthcare Providers: IncredibleEmployment.be  This test is not yet approved or cleared by the [place] FDA and has been authorized for detection and/or diagnosis of SARS-CoV-2 by FDA under an Emergency Use Authorization (EUA). This EUA will remain in effect (meaning this test can be used) for the duration of the COVID-19 declaration under Section 564(b)(1) of the Act, 21 U.S.C. section 360bbb-3(b)(1), unless the authorization is terminated or revoked.  Performed at  [facility], [place] [address] [place], [place] [address]   SARS CORONAVIRUS 2 (TAT 6-24 HRS) Nasopharyngeal Nasopharyngeal Swab     Status: None   Collection Time: 05/15/21 10:14 PM   Specimen: Nasopharyngeal Swab  Result Value Ref Range Status   SARS Coronavirus 2 NEGATIVE NEGATIVE Final    Comment: (NOTE) SARS-CoV-2 target nucleic acids are NOT DETECTED.  The SARS-CoV-2 RNA is generally detectable in upper and lower respiratory specimens during  the acute phase of infection. Negative results do not preclude SARS-CoV-2 infection, do not rule out co-infections with other pathogens, and should not be used as the sole basis for treatment or other patient management decisions. Negative results must be combined with clinical observations, patient history, and epidemiological information. The expected result is Negative.  Fact Sheet for Patients: SugarRoll.be  Fact Sheet for Healthcare Providers: https://www.woods-mathews.com/  This test is not yet approved or cleared by the [place] FDA and  has been authorized for detection and/or diagnosis of SARS-CoV-2 by FDA under an Emergency Use Authorization (EUA). This EUA will remain  in effect (meaning this test can be used) for the duration of the COVID-19 declaration under Se ction 564(b)(1) of the Act, 21 U.S.C. section 360bbb-3(b)(1), unless the authorization is terminated or revoked sooner.  Performed at [facility], [place] [address], [place], [place] [address]   MRSA Next Gen by PCR, Nasal     Status: None   Collection Time: 05/16/21 11:24 AM   Specimen: Nasal Mucosa; Nasal Swab  Result Value Ref Range Status   MRSA by PCR Next Gen NOT DETECTED NOT DETECTED Final    Comment: (NOTE) The GeneXpert MRSA Assay (FDA approved for NASAL specimens only), is one component of a comprehensive MRSA colonization surveillance program. It is not intended to diagnose MRSA  infection nor to guide or monitor treatment for MRSA infections. Test performance is not FDA approved in patients less than 19 years old. Performed at [facility], [place] [address] [place], [place] [address]        Radiology Studies: DG Swallowing Func-Speech Pathology  Result Date: 05/18/2021 Formatting of this result is different from the original. Objective Swallowing Evaluation: Type of Study: MBS-Modified Barium Swallow Study  Patient Details Name: [name] MRN: [id number] Date of Birth: 07/23/1961 Today's Date: 05/18/2021 Time: SLP Start Time (ACUTE ONLY): [id number] -SLP Stop Time (ACUTE ONLY): 1208 SLP Time Calculation (min) (ACUTE ONLY): 23 min Past Medical History: Past Medical History: Diagnosis Date  Alcohol abuse   Arthritis   Crack cocaine use   Depression   Diabetes mellitus   Drug abuse ([place])   DTs (delirium tremens) ([place])   history of   Gout   Noncompliance with medication regimen   Schizophrenia ([place])   Stroke ([place])   Systolic heart failure secondary to hypertension ([place])   Uncontrolled hypertension  Past Surgical History: Past Surgical History: Procedure Laterality Date  LEFT HEART CATH AND CORONARY ANGIOGRAPHY N/A 02/03/2018  Procedure: LEFT HEART CATH AND CORONARY ANGIOGRAPHY;  Surgeon: Jettie Booze, MD;  Location: [facility];  Service: Cardiovascular;  Laterality: N/A;  MANDIBLE RECONSTRUCTION    TONSILLECTOMY   HPI: Pt is a 61 y.o. male with ongoing nausea and vomiting after recent discharge on 05/14/21. During recent admission pt noted to have coffee-ground emesis felt to be secondary to Mallory-Weiss tear GI consulted but recommended holding off on EGD. Palliative Care: full scope, full code. 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 MBS 04/19/20:  Oropharyngeal dysphagia with left labial weakness, and a pharyngeal delay;  regular texture diet and thin liquids recommended  No data recorded Assessment / Plan / Recommendation CHL IP CLINICAL IMPRESSIONS 05/18/2021 Clinical Impression Pt presents with oropharyngeal dysphagia characterize by reduced labial seal, reduced bolus cohesion, reduced posterior bolus propulsion, weak bolus manipulation, prolonged mastication, and a pharyngeal delay. He demonstrated  anterior spillage to the right with all liquids, reduced labial stripping, residue in the right alteral sulcus, premature spillage to the valleculae and pyriform sinuses, penetration (PAS 5) and aspiration (PAS 8) with thin liquids. The initial bolus of thin liquids via cup was noted to prematurely spill to the pyriform sinuses and into the larynx. Subsequent instances of aspiration with thin liquids via straw were trace and of penetrated material after deglutition. Coughing was inconsistently noted following aspiration of thin liquids, but was ineffective. A dysphagia 1 diet with nectar thick liquids is recommended at this time and SLP will follow for treatment. SLP Visit Diagnosis Dysphagia, oropharyngeal phase (R13.12) Attention and concentration deficit following -- Frontal lobe and executive function deficit following -- Impact on safety and function Mild aspiration risk;Moderate aspiration risk   CHL IP TREATMENT RECOMMENDATION 05/18/2021 Treatment Recommendations Therapy as outlined in treatment plan below   Prognosis 05/18/2021 Prognosis for Safe Diet Advancement Good Barriers to Reach Goals Cognitive deficits Barriers/Prognosis Comment -- CHL IP DIET RECOMMENDATION 05/18/2021 SLP Diet Recommendations Dysphagia 1 (Puree) solids;Nectar thick liquid Liquid Administration via Cup;Straw Medication Administration Crushed with puree Compensations Small sips/bites;Slow rate Postural Changes Seated upright at 90 degrees;Remain semi-upright after after feeds/meals (Comment)   CHL IP OTHER RECOMMENDATIONS 05/18/2021 Recommended Consults -- Oral  Care Recommendations Oral care BID Other Recommendations --   CHL IP FOLLOW UP RECOMMENDATIONS 05/18/2021 Follow up Recommendations Skilled Nursing facility   [facility] IP FREQUENCY AND DURATION 05/18/2021 Speech Therapy Frequency (ACUTE ONLY) min 2x/week Treatment Duration 2 weeks      CHL IP ORAL PHASE 05/18/2021 Oral Phase Impaired Oral - Pudding Teaspoon -- Oral - Pudding Cup -- Oral - Honey Teaspoon -- Oral - Honey Cup -- Oral - Nectar Teaspoon -- Oral - Nectar Cup -- Oral - Nectar Straw Right anterior bolus loss;Weak lingual manipulation;Decreased bolus cohesion;Premature spillage Oral - Thin Teaspoon -- Oral - Thin Cup Right anterior bolus loss;Weak lingual manipulation;Decreased bolus cohesion;Premature spillage Oral - Thin Straw Right anterior bolus loss;Weak lingual manipulation;Decreased bolus cohesion;Premature spillage Oral - Puree Weak lingual manipulation;Decreased bolus cohesion;Premature spillage;Right pocketing in lateral sulci Oral - Mech Soft Weak lingual manipulation;Decreased bolus cohesion;Impaired mastication Oral - Regular -- Oral - Multi-Consistency -- Oral - Pill -- Oral Phase - Comment --  CHL IP PHARYNGEAL PHASE 05/18/2021 Pharyngeal Phase Impaired Pharyngeal- Pudding Teaspoon -- Pharyngeal -- Pharyngeal- Pudding Cup -- Pharyngeal -- Pharyngeal- Honey Teaspoon -- Pharyngeal -- Pharyngeal- Honey Cup -- Pharyngeal -- Pharyngeal- Nectar Teaspoon -- Pharyngeal -- Pharyngeal- Nectar Cup -- Pharyngeal -- Pharyngeal- Nectar Straw Delayed swallow initiation-pyriform sinuses;Penetration/Aspiration during swallow;Penetration/Apiration after swallow;Trace aspiration Pharyngeal Material enters airway, remains ABOVE vocal cords and not ejected out;Material enters airway, passes BELOW cords without attempt by patient to eject out (silent aspiration);Material enters airway, passes BELOW cords and not ejected out despite cough attempt by patient Pharyngeal- Thin Teaspoon -- Pharyngeal -- Pharyngeal- Thin Cup Delayed  swallow initiation-pyriform sinuses;Penetration/Aspiration during swallow;Penetration/Apiration after swallow;Moderate aspiration Pharyngeal Material enters airway, passes BELOW cords without attempt by patient to eject out (silent aspiration);Material enters airway, passes BELOW cords and not ejected out despite cough attempt by patient;Material enters airway, CONTACTS cords and not ejected out Pharyngeal- Thin Straw Delayed swallow initiation-pyriform sinuses;Penetration/Aspiration during swallow;Penetration/Apiration after swallow;Trace aspiration Pharyngeal Material enters airway, CONTACTS cords and not ejected out;Material enters airway, passes BELOW cords without attempt by patient to eject out (silent aspiration) Pharyngeal- Puree Delayed swallow initiation-pyriform sinuses;Delayed swallow initiation-vallecula Pharyngeal -- Pharyngeal- Mechanical Soft Delayed swallow initiation-vallecula;Delayed swallow initiation-pyriform sinuses Pharyngeal --  Pharyngeal- Regular -- Pharyngeal -- Pharyngeal- Multi-consistency -- Pharyngeal -- Pharyngeal- Pill -- Pharyngeal -- Pharyngeal Comment --  CHL IP CERVICAL ESOPHAGEAL PHASE 05/18/2021 Cervical Esophageal Phase WFL Pudding Teaspoon -- Pudding Cup -- Honey Teaspoon -- Honey Cup -- Nectar Teaspoon -- Nectar Cup -- Nectar Straw -- Thin Teaspoon -- Thin Cup -- Thin Straw -- Puree -- Mechanical Soft -- Regular -- Multi-consistency -- Pill -- Cervical Esophageal Comment -- Shanika I. Hardin Negus, [place], [place] Office number [phone] Pager [place] 05/18/2021, 3:23 PM               Scheduled Meds:  amLODipine  10 mg Oral q morning   atorvastatin  40 mg Oral q1800   carvedilol  25 mg Oral BID WC   chlorthalidone  50 mg Oral Daily   clopidogrel  75 mg Oral q morning   feeding supplement  237 mL Oral TID BM   FLUoxetine  10 mg Oral Daily   insulin aspart  0-9 Units Subcutaneous Q4H   losartan  75 mg Oral Daily    metoCLOPramide (REGLAN) injection  10 mg Intravenous Q8H   multivitamin with minerals  1 tablet Oral Daily   paliperidone  6 mg Oral q morning   pantoprazole (PROTONIX) IV  40 mg Intravenous QHS   polyethylene glycol  17 g Oral BID   senna  2 tablet Oral QHS   Continuous Infusions:  promethazine (PHENERGAN) injection (IM or IVPB)       LOS: 3 days   Time spent: 70mn  Aislee Landgren C Abir Craine, DO [facility]  If 7PM-7AM, please contact night-coverage [email or web address]  05/19/2021, 7:34 AM

## 2021-05-19 NOTE — Progress Notes (Signed)
   Palliative Medicine Inpatient Follow Up Note  Reason for consult:  Goals of Care   HPI:  Per intake H&P --> 60 y/o male with prior hx of EtOH abuse, cocaine abuse, DM2, malignant HTN, schizophrenia, chronic combined CHF, multiple prior strokes with persistent dysarthria and SNF residence at this time he was most recently seen in the hospital and discharged on 05/14/2021 after having persistent nausea vomiting and constipation.   Palliative care was consulted for goals of care conversations in the setting of two recent inpatient admissions and multiple co-morbidities.   Today's Discussion (05/19/2021):  *Please note that this is a verbal dictation therefore any spelling or grammatical errors are due to the "[place] [employer]" system interpretation.  Chart reviewed. [place] is maintaining about 25% of meals. Per SLP on Dysphagia 1 Diet. Remains weak. Has received x1 dose of Zofran in the past 24 hours and no additional phenergan.   I met with Gerald Stabs at bedside he was awake and alert to self. He denied nausea or abdominal pain. Briefly reviewed the plan for rehabilitation which he was in agreement with.   Plan for Rehabilitation on discharge - a facility other than [facility].  Questions and concerns addressed   Objective Assessment: Vital Signs Vitals:   05/19/21 0205 05/19/21 0345  BP: (!) 170/101 (!) 173/101  Pulse: 95   Resp: 18 (!) 24  Temp:    SpO2: 100%     Intake/Output Summary (Last 24 hours) at 05/19/2021 0931 Last data filed at 05/19/2021 2094 Gross per 24 hour  Intake 200 ml  Output 425 ml  Net -225 ml   Last Weight  Most recent update: 05/15/2021  2:21 PM    Weight  79.4 kg (175 lb)            Gen: Middle-aged African-American man in no acute distress HEENT: moist mucous membranes CV: Regular rate and rhythm PULM: On room air ABD: soft/nontender EXT: No edema Neuro: Alert to self, disoriented to place and situation   SUMMARY OF RECOMMENDATIONS    Full Code / Full Scope of Care   Have emailed patient's daughter, Katharine Look a copy of the "Hard Choices for [employer]" book and a MOST form   Patients daughter unable to provide e/o legal guardian paperwork therefore we will request completion of advance directive with Chaplain   Ongoing incremental palliative team support   Code Status/Advance Care Planning: FULL CODE   Symptom Management:  Intractable nausea and vomiting - - Speech therapy - ongoing D1 Diet - Continue  IV reglan ATC - switch to oral tomorrow - Will DC Phenergan if not used in the next 24 hours - Continue bowel regimen - GI presently no plans to pursue EGD, conservative management  Generalized Weakness: - OOB to chair for all meals  Time Spent: 25 Greater than 50% of the time was spent in counseling and coordination of care ______________________________________________________________________________________ [place] Team Team Cell Phone: [phone] Please utilize secure chat with additional questions, if there is no response within 30 minutes please call the above phone number  Palliative Medicine Team providers are available by phone from 7am to 7pm daily and can be reached through the team cell phone.  Should this patient require assistance outside of these hours, please call the patient's attending physician.

## 2021-05-20 DIAGNOSIS — Z515 Encounter for palliative care: Secondary | ICD-10-CM | POA: Diagnosis not present

## 2021-05-20 LAB — BASIC METABOLIC PANEL
Anion gap: 12 (ref 5–15)
BUN: 13 mg/dL (ref 6–20)
CO2: 28 mmol/L (ref 22–32)
Calcium: 9.2 mg/dL (ref 8.9–10.3)
Chloride: 97 mmol/L — ABNORMAL LOW (ref 98–111)
Creatinine, Ser: 1.21 mg/dL (ref 0.61–1.24)
GFR, Estimated: >60 mL/min (ref >60)
Glucose, Bld: 124 mg/dL — ABNORMAL HIGH (ref 70–99)
Potassium: 3 mmol/L — ABNORMAL LOW (ref 3.5–5.1)
Sodium: 137 mmol/L (ref 135–145)

## 2021-05-20 LAB — GLUCOSE, CAPILLARY
Glucose-Capillary: 115 mg/dL — ABNORMAL HIGH (ref 70–99)
Glucose-Capillary: 134 mg/dL — ABNORMAL HIGH (ref 70–99)
Glucose-Capillary: 129 mg/dL — ABNORMAL HIGH (ref 70–99)
Glucose-Capillary: 147 mg/dL — ABNORMAL HIGH (ref 70–99)
Glucose-Capillary: 131 mg/dL — ABNORMAL HIGH (ref 70–99)
Glucose-Capillary: 151 mg/dL — ABNORMAL HIGH (ref 70–99)
Glucose-Capillary: 185 mg/dL — ABNORMAL HIGH (ref 70–99)

## 2021-05-20 LAB — CBC
HCT: 39.4 % (ref 39.0–52.0)
Hemoglobin: 12.6 g/dL — ABNORMAL LOW (ref 13.0–17.0)
MCH: 27.3 pg (ref 26.0–34.0)
MCHC: 32 g/dL (ref 30.0–36.0)
MCV: 85.3 fL (ref 80.0–100.0)
Platelets: 203 10*3/uL (ref 150–400)
RBC: 4.62 MIL/uL (ref 4.22–5.81)
RDW: 13.5 % (ref 11.5–15.5)
WBC: 10.4 10*3/uL (ref 4.0–10.5)
nRBC: 0 % (ref 0.0–0.2)

## 2021-05-20 NOTE — Progress Notes (Signed)
PROGRESS NOTE    [name]  [id number] DOB: 02/25/1961 DOA: 05/15/2021 PCP: Boyce Medici, FNP   Brief Narrative:  [name] is a 60 y.o. 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 Presented with ongoing nausea and vomiting. Was recently admitted  for persistent nausea vomiting and constipation discharged on 05/14/2021 with resolution of symptoms. During patient's last hospitalization he had a CT of the abdomen pelvis which was negative and a head CT without acute findings.  Patient had been taking Zofran at his facility but it was not helping. During his recent admission was noted to have coffee-ground emesis felt to be secondary to Mallory-Weiss tear GI consulted not recommended holding off on EGD.  Initially nausea and vomiting resolved although it has resumed once patient went back to nursing home facility.  Hemoglobin did remain stable and he was added on PPI.  Patient representing with nausea vomiting extremely poor p.o. intake from facility for reevaluation, and GI to be consulted for their expertise.  Assessment & Plan:   Active Problems:   Hyperlipidemia associated with type 2 diabetes mellitus (HCC)   Hypokalemia   Schizophrenia (HCC)   History of CVA with residual deficit   Chronic combined systolic (congestive) and diastolic (congestive) heart failure (HCC)   Benign essential HTN   Intractable nausea and vomiting   Intractable vomiting with nausea, unspecified vomiting type   Intractable nausea and vomiting secondary to dysphagia Rule out stricture/diverticulum -Continue to advance diet per speech -continues to have very poor p.o. intake despite clearance -Will move forward with PEG tube placement, patient is agreeable, will attempt to confirm with family and to ensure no further  questions prior to procedure in the next 24 to 48 hours. -Barium swallow remarkable for spillage and aspiration:       SLP Diet Recommendations: Dysphagia 1 (Puree) solids;Nectar thick liquid      Liquid Administration via: Cup;Straw      Medication Administration: Crushed with puree      Supervision: Full assist for feeding;Full supervision/cueing for compensatory strategies      Compensations: Small sips/bites;Slow rate      Postural Changes: Seated upright at 90 degrees;Remain semi-upright after after feeds/meals (Comment)      Oral Care Recommendations: Oral care BID  History of multiple CVAs with residual right-sided weakness-okay to resume antiplatelet agents per GI, he is also now on a PPI  Essential hypertension-continue home medications  Hyperlipidemia-continue statin History of schizophrenia-continue home medications Chronic combined CHF-appears euvolemic, continue home medications  Type 2 diabetes mellitus-continue home medications Constipation-resolved with aggressive bowel regimen, continue on discharge  DVT prophylaxis: None given previous episodes of hematemesis coffee-ground emesis Code Status: Full Family Communication: None present -no answer over the phone  Status is: Inpatient  Dispo: The patient is from: Facility              Anticipated d/c is to: Same              Anticipated d/c date is: 48 to 72 hours              Patient currently not medically stable for discharge  Consultants:  GI, [facility]  Procedures:  Pending above  Antimicrobials:  Not currently indicated  Subjective: No acute issues or events overnight, continue advance diet as tolerated, review of systems limited given patient's baseline  Objective: Vitals:  05/19/21 1600 05/19/21 1927 05/19/21 2300 05/20/21 0356  BP: 121/88 (!) 159/138 106/77   Pulse: 85  79   Resp: 20  (!) 9   Temp: 99.9 F (37.7 C) 99.3 F (37.4 C) 99.1 F (37.3 C) 99 F (37.2 C)  TempSrc: Axillary Axillary Oral    SpO2: 95%  90%   Weight:      Height:        Intake/Output Summary (Last 24 hours) at 05/20/2021 0748 Last data filed at 05/19/2021 1342 Gross per 24 hour  Intake 143 ml  Output 200 ml  Net -57 ml    Filed Weights   05/15/21 1421  Weight: 79.4 kg    Examination:  General:  Pleasantly resting in bed, No acute distress. HEENT: Right-sided facial droop with oral spillage Neck:  Without mass or deformity.  Trachea is midline. Lungs:  Clear to auscultate bilaterally without rhonchi, wheeze, or rales. Heart:  Regular rate and rhythm.  Without murmurs, rubs, or gallops. Abdomen:  Soft, nontender, nondistended.  Without guarding or rebound. Extremities: Without cyanosis, clubbing, edema Vascular:  Dorsalis pedis and posterior tibial pulses palpable bilaterally. Skin:  Warm and dry, no erythema, no ulcerations.   Data Reviewed: I have personally reviewed following labs and imaging studies  CBC: Recent Labs  Lab 05/15/21 1431 05/16/21 0057 05/17/21 0321 05/18/21 0151 05/19/21 0306 05/20/21 0338  WBC 8.1 8.0 7.7 7.1 9.6 10.4  NEUTROABS 5.1 4.8  --   --   --   --   HGB 13.1 12.1* 12.3* 11.6* 12.7* 12.6*  HCT 41.3 37.7* 38.6* 36.2* 38.6* 39.4  MCV 85.2 85.1 84.5 85.4 83.9 85.3  PLT 224 192 206 209 207 [id number]    Basic Metabolic Panel: Recent Labs  Lab 05/16/21 0057 05/16/21 0535 05/17/21 0321 05/18/21 0151 05/19/21 0306 05/20/21 0338  NA 138  138  --  138 137 138 137  K 3.1*  3.1*  --  3.1* 3.0* 3.3* 3.0*  CL 101  102  --  99 99 99 97*  CO2 29  28  --  '27 30 28 28  '$ GLUCOSE 107*  107*  --  111* 112* 135* 124*  BUN 13  11  --  '9 11 10 13  '$ CREATININE 1.20  1.18  --  1.17 1.23 1.14 1.21  CALCIUM 8.9  8.9  --  9.4 9.1 9.4 9.2  MG 1.7 1.6*  --   --   --   --   PHOS 3.0 2.7  --   --   --   --     GFR: Estimated Creatinine Clearance: 70.9 mL/min (by C-G formula based on SCr of 1.21 mg/dL). Liver Function Tests: Recent Labs  Lab 05/15/21 1431 05/16/21 0057   AST 14* 14*  ALT 7 7  ALKPHOS 82 71  BILITOT 1.0 0.8  PROT 7.9 7.0  ALBUMIN 4.3 3.9    Recent Labs  Lab 05/15/21 1431  LIPASE 37    No results for input(s): AMMONIA in the last 168 hours. Coagulation Profile: No results for input(s): INR, PROTIME in the last 168 hours. Cardiac Enzymes: Recent Labs  Lab 05/16/21 0057  CKTOTAL 110    BNP (last 3 results) No results for input(s): PROBNP in the last 8760 hours. HbA1C: No results for input(s): HGBA1C in the last 72 hours.  CBG: Recent Labs  Lab 05/19/21 1719 05/19/21 1851 05/19/21 1926 05/19/21 2333 05/20/21 0355  GLUCAP 133* 113* 125* 105* 115*    Lipid  Profile: No results for input(s): CHOL, HDL, LDLCALC, TRIG, CHOLHDL, LDLDIRECT in the last 72 hours. Thyroid Function Tests: No results for input(s): TSH, T4TOTAL, FREET4, T3FREE, THYROIDAB in the last 72 hours.  Anemia Panel: No results for input(s): VITAMINB12, FOLATE, FERRITIN, TIBC, IRON, RETICCTPCT in the last 72 hours. Sepsis Labs: Recent Labs  Lab 05/16/21 0057  LATICACIDVEN 0.9     Recent Results (from the past 240 hour(s))  Resp Panel by RT-PCR (Flu A&B, Covid) Nasopharyngeal Swab     Status: None   Collection Time: 05/13/21  3:30 AM   Specimen: Nasopharyngeal Swab; Nasopharyngeal(NP) swabs in vial transport medium  Result Value Ref Range Status   SARS Coronavirus 2 by RT PCR NEGATIVE NEGATIVE Final    Comment: (NOTE) SARS-CoV-2 target nucleic acids are NOT DETECTED.  The SARS-CoV-2 RNA is generally detectable in upper respiratory specimens during the acute phase of infection. The lowest concentration of SARS-CoV-2 viral copies this assay can detect is 138 copies/mL. A negative result does not preclude SARS-Cov-2 infection and should not be used as the sole basis for treatment or other patient management decisions. A negative result may occur with  improper specimen collection/handling, submission of specimen other than nasopharyngeal swab,  presence of viral mutation(s) within the areas targeted by this assay, and inadequate number of viral copies(<138 copies/mL). A negative result must be combined with clinical observations, patient history, and epidemiological information. The expected result is Negative.  Fact Sheet for Patients:  EntrepreneurPulse.com.au  Fact Sheet for Healthcare Providers:  IncredibleEmployment.be  This test is no t yet approved or cleared by the [place] FDA and  has been authorized for detection and/or diagnosis of SARS-CoV-2 by FDA under an Emergency Use Authorization (EUA). This EUA will remain  in effect (meaning this test can be used) for the duration of the COVID-19 declaration under Section 564(b)(1) of the Act, 21 U.S.C.section 360bbb-3(b)(1), unless the authorization is terminated  or revoked sooner.       Influenza A by PCR NEGATIVE NEGATIVE Final   Influenza B by PCR NEGATIVE NEGATIVE Final    Comment: (NOTE) The Xpert Xpress SARS-CoV-2/FLU/RSV plus assay is intended as an aid in the diagnosis of influenza from Nasopharyngeal swab specimens and should not be used as a sole basis for treatment. Nasal washings and aspirates are unacceptable for Xpert Xpress SARS-CoV-2/FLU/RSV testing.  Fact Sheet for Patients: EntrepreneurPulse.com.au  Fact Sheet for Healthcare Providers: IncredibleEmployment.be  This test is not yet approved or cleared by the [place] FDA and has been authorized for detection and/or diagnosis of SARS-CoV-2 by FDA under an Emergency Use Authorization (EUA). This EUA will remain in effect (meaning this test can be used) for the duration of the COVID-19 declaration under Section 564(b)(1) of the Act, 21 U.S.C. section 360bbb-3(b)(1), unless the authorization is terminated or revoked.  Performed at [facility], [place] [address] [place], [place] [address]   SARS  CORONAVIRUS 2 (TAT 6-24 HRS) Nasopharyngeal Nasopharyngeal Swab     Status: None   Collection Time: 05/15/21 10:14 PM   Specimen: Nasopharyngeal Swab  Result Value Ref Range Status   SARS Coronavirus 2 NEGATIVE NEGATIVE Final    Comment: (NOTE) SARS-CoV-2 target nucleic acids are NOT DETECTED.  The SARS-CoV-2 RNA is generally detectable in upper and lower respiratory specimens during the acute phase of infection. Negative results do not preclude SARS-CoV-2 infection, do not rule out co-infections with other pathogens, and should not be used as the sole basis for treatment or  other patient management decisions. Negative results must be combined with clinical observations, patient history, and epidemiological information. The expected result is Negative.  Fact Sheet for Patients: SugarRoll.be  Fact Sheet for Healthcare Providers: https://www.woods-mathews.com/  This test is not yet approved or cleared by the [place] FDA and  has been authorized for detection and/or diagnosis of SARS-CoV-2 by FDA under an Emergency Use Authorization (EUA). This EUA will remain  in effect (meaning this test can be used) for the duration of the COVID-19 declaration under Se ction 564(b)(1) of the Act, 21 U.S.C. section 360bbb-3(b)(1), unless the authorization is terminated or revoked sooner.  Performed at [facility], [place] [address], [place], [place] [address]   MRSA Next Gen by PCR, Nasal     Status: None   Collection Time: 05/16/21 11:24 AM   Specimen: Nasal Mucosa; Nasal Swab  Result Value Ref Range Status   MRSA by PCR Next Gen NOT DETECTED NOT DETECTED Final    Comment: (NOTE) The GeneXpert MRSA Assay (FDA approved for NASAL specimens only), is one component of a comprehensive MRSA colonization surveillance program. It is not intended to diagnose MRSA infection nor to guide or monitor treatment for MRSA infections. Test performance is  not FDA approved in patients less than 67 years old. Performed at [facility], [place] [address] [place], [place] [address]        Radiology Studies: DG Swallowing Func-Speech Pathology  Result Date: 05/18/2021 Formatting of this result is different from the original. Objective Swallowing Evaluation: Type of Study: MBS-Modified Barium Swallow Study  Patient Details Name: Offie Farrell MRN: [id number] Date of Birth: November 24, 1960 Today's Date: 05/18/2021 Time: SLP Start Time (ACUTE ONLY): [id number] -SLP Stop Time (ACUTE ONLY): 1208 SLP Time Calculation (min) (ACUTE ONLY): 23 min Past Medical History: Past Medical History: Diagnosis Date  Alcohol abuse   Arthritis   Crack cocaine use   Depression   Diabetes mellitus   Drug abuse ([place])   DTs (delirium tremens) ([place])   history of   Gout   Noncompliance with medication regimen   Schizophrenia ([place])   Stroke ([place])   Systolic heart failure secondary to hypertension ([place])   Uncontrolled hypertension  Past Surgical History: Past Surgical History: Procedure Laterality Date  LEFT HEART CATH AND CORONARY ANGIOGRAPHY N/A 02/03/2018  Procedure: LEFT HEART CATH AND CORONARY ANGIOGRAPHY;  Surgeon: Jettie Booze, MD;  Location: [facility];  Service: Cardiovascular;  Laterality: N/A;  MANDIBLE RECONSTRUCTION    TONSILLECTOMY   HPI: Pt is a 60 y.o. male with ongoing nausea and vomiting after recent discharge on 05/14/21. During recent admission pt noted to have coffee-ground emesis felt to be secondary to Mallory-Weiss tear GI consulted but recommended holding off on EGD. Palliative Care: full scope, full code. 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 MBS 04/19/20:  Oropharyngeal dysphagia with left labial weakness, and a pharyngeal delay; regular texture diet and thin liquids recommended  No data recorded Assessment / Plan /  Recommendation CHL IP CLINICAL IMPRESSIONS 05/18/2021 Clinical Impression Pt presents with oropharyngeal dysphagia characterize by reduced labial seal, reduced bolus cohesion, reduced posterior bolus propulsion, weak bolus manipulation, prolonged mastication, and a pharyngeal delay. He demonstrated anterior spillage to the right with all liquids, reduced labial stripping, residue in the right alteral sulcus, premature spillage to the valleculae and pyriform sinuses, penetration (PAS 5) and aspiration (PAS 8)  with thin liquids. The initial bolus of thin liquids via cup was noted to prematurely spill to the pyriform sinuses and into the larynx. Subsequent instances of aspiration with thin liquids via straw were trace and of penetrated material after deglutition. Coughing was inconsistently noted following aspiration of thin liquids, but was ineffective. A dysphagia 1 diet with nectar thick liquids is recommended at this time and SLP will follow for treatment. SLP Visit Diagnosis Dysphagia, oropharyngeal phase (R13.12) Attention and concentration deficit following -- Frontal lobe and executive function deficit following -- Impact on safety and function Mild aspiration risk;Moderate aspiration risk   CHL IP TREATMENT RECOMMENDATION 05/18/2021 Treatment Recommendations Therapy as outlined in treatment plan below   Prognosis 05/18/2021 Prognosis for Safe Diet Advancement Good Barriers to Reach Goals Cognitive deficits Barriers/Prognosis Comment -- CHL IP DIET RECOMMENDATION 05/18/2021 SLP Diet Recommendations Dysphagia 1 (Puree) solids;Nectar thick liquid Liquid Administration via Cup;Straw Medication Administration Crushed with puree Compensations Small sips/bites;Slow rate Postural Changes Seated upright at 90 degrees;Remain semi-upright after after feeds/meals (Comment)   CHL IP OTHER RECOMMENDATIONS 05/18/2021 Recommended Consults -- Oral Care Recommendations Oral care BID Other Recommendations --   CHL IP FOLLOW UP  RECOMMENDATIONS 05/18/2021 Follow up Recommendations Skilled Nursing facility   [facility] IP FREQUENCY AND DURATION 05/18/2021 Speech Therapy Frequency (ACUTE ONLY) min 2x/week Treatment Duration 2 weeks      CHL IP ORAL PHASE 05/18/2021 Oral Phase Impaired Oral - Pudding Teaspoon -- Oral - Pudding Cup -- Oral - Honey Teaspoon -- Oral - Honey Cup -- Oral - Nectar Teaspoon -- Oral - Nectar Cup -- Oral - Nectar Straw Right anterior bolus loss;Weak lingual manipulation;Decreased bolus cohesion;Premature spillage Oral - Thin Teaspoon -- Oral - Thin Cup Right anterior bolus loss;Weak lingual manipulation;Decreased bolus cohesion;Premature spillage Oral - Thin Straw Right anterior bolus loss;Weak lingual manipulation;Decreased bolus cohesion;Premature spillage Oral - Puree Weak lingual manipulation;Decreased bolus cohesion;Premature spillage;Right pocketing in lateral sulci Oral - Mech Soft Weak lingual manipulation;Decreased bolus cohesion;Impaired mastication Oral - Regular -- Oral - Multi-Consistency -- Oral - Pill -- Oral Phase - Comment --  CHL IP PHARYNGEAL PHASE 05/18/2021 Pharyngeal Phase Impaired Pharyngeal- Pudding Teaspoon -- Pharyngeal -- Pharyngeal- Pudding Cup -- Pharyngeal -- Pharyngeal- Honey Teaspoon -- Pharyngeal -- Pharyngeal- Honey Cup -- Pharyngeal -- Pharyngeal- Nectar Teaspoon -- Pharyngeal -- Pharyngeal- Nectar Cup -- Pharyngeal -- Pharyngeal- Nectar Straw Delayed swallow initiation-pyriform sinuses;Penetration/Aspiration during swallow;Penetration/Apiration after swallow;Trace aspiration Pharyngeal Material enters airway, remains ABOVE vocal cords and not ejected out;Material enters airway, passes BELOW cords without attempt by patient to eject out (silent aspiration);Material enters airway, passes BELOW cords and not ejected out despite cough attempt by patient Pharyngeal- Thin Teaspoon -- Pharyngeal -- Pharyngeal- Thin Cup Delayed swallow initiation-pyriform sinuses;Penetration/Aspiration during  swallow;Penetration/Apiration after swallow;Moderate aspiration Pharyngeal Material enters airway, passes BELOW cords without attempt by patient to eject out (silent aspiration);Material enters airway, passes BELOW cords and not ejected out despite cough attempt by patient;Material enters airway, CONTACTS cords and not ejected out Pharyngeal- Thin Straw Delayed swallow initiation-pyriform sinuses;Penetration/Aspiration during swallow;Penetration/Apiration after swallow;Trace aspiration Pharyngeal Material enters airway, CONTACTS cords and not ejected out;Material enters airway, passes BELOW cords without attempt by patient to eject out (silent aspiration) Pharyngeal- Puree Delayed swallow initiation-pyriform sinuses;Delayed swallow initiation-vallecula Pharyngeal -- Pharyngeal- Mechanical Soft Delayed swallow initiation-vallecula;Delayed swallow initiation-pyriform sinuses Pharyngeal -- Pharyngeal- Regular -- Pharyngeal -- Pharyngeal- Multi-consistency -- Pharyngeal -- Pharyngeal- Pill -- Pharyngeal -- Pharyngeal Comment --  CHL IP CERVICAL ESOPHAGEAL PHASE 05/18/2021 Cervical Esophageal Phase WFL Pudding Teaspoon --  Pudding Cup -- Honey Teaspoon -- Honey Cup -- Nectar Teaspoon -- Nectar Cup -- Nectar Straw -- Thin Teaspoon -- Thin Cup -- Thin Straw -- Puree -- Mechanical Soft -- Regular -- Multi-consistency -- Pill -- Cervical Esophageal Comment -- Shanika I. Hardin Negus, [place], [place] Office number [phone] Pager [place] 05/18/2021, 3:23 PM               Scheduled Meds:  amLODipine  10 mg Oral q morning   atorvastatin  40 mg Oral q1800   carvedilol  25 mg Oral BID WC   chlorthalidone  50 mg Oral Daily   clopidogrel  75 mg Oral q morning   feeding supplement  237 mL Oral TID BM   FLUoxetine  10 mg Oral Daily   insulin aspart  0-9 Units Subcutaneous Q4H   losartan  75 mg Oral Daily   metoCLOPramide (REGLAN) injection  10 mg Intravenous Q8H   multivitamin  with minerals  1 tablet Oral Daily   paliperidone  6 mg Oral q morning   pantoprazole (PROTONIX) IV  40 mg Intravenous QHS   polyethylene glycol  17 g Oral BID   senna  2 tablet Oral QHS   Continuous Infusions:  promethazine (PHENERGAN) injection (IM or IVPB)       LOS: 4 days   Time spent: 29mn  Rajan Burgard C Malikah Principato, DO [facility]  If 7PM-7AM, please contact night-coverage [email or web address]  05/20/2021, 7:48 AM

## 2021-05-20 NOTE — Progress Notes (Addendum)
Palliative Medicine Inpatient Follow Up Note  Reason for consult:  Goals of Care   HPI:  Per intake H&P --> 60 y/o male with prior hx of EtOH abuse, cocaine abuse, DM2, malignant HTN, schizophrenia, chronic combined CHF, multiple prior strokes with persistent dysarthria and SNF residence at this time he was most recently seen in the hospital and discharged on 05/14/2021 after having persistent nausea vomiting and constipation.   Palliative care was consulted for goals of care conversations in the setting of two recent inpatient admissions and multiple co-morbidities.   Today's Discussion (05/20/2021):  *Please note that this is a verbal dictation therefore any spelling or grammatical errors are due to the "[place]" system interpretation.  Chart reviewed. I met Chris at bedside this morning. He was noted to be self suctioning quite a bit of saliva through the use of his right hand. He appeared more weakened than yesterday on the L side, this was quite pronounced when myself and nursing tried to get him up. The primary medical team has been alerted to this.  Nadine, RN shares her concerns about Gerald Stabs' present health state and his inability to manage his own secretions. He does seem to be able though to recognize the need to self suction. Per speech therapy notes Gerald Stabs needs 1:1 feeding intake has ranged from 25-50%.   Gerald Stabs denies any active nausea at this time.  I have called patients daughter, Katharine Look to further discuss his present situation though no one answered. I will try again later in the day.  Plan to continue ongoing support and optimize enough for him to trial rehabilitation - of note he was prior a long term resident at [facility] but family has requested a change of facility.   Questions and concerns addressed   Objective Assessment: Vital Signs Vitals:   05/20/21 0356 05/20/21 0751  BP:  (!) 155/123  Pulse:  92  Resp:  16  Temp: 99 F (37.2 C) 98.2 F (36.8  C)  SpO2:  97%    Intake/Output Summary (Last 24 hours) at 05/20/2021 8101 Last data filed at 05/20/2021 0757 Gross per 24 hour  Intake 143 ml  Output 500 ml  Net -357 ml    Last Weight  Most recent update: 05/15/2021  2:21 PM    Weight  79.4 kg (175 lb)            Gen: Middle-aged African-American man in no acute distress HEENT: moist mucous membranes CV: Regular rate and rhythm PULM: On room air ABD: soft/nontender EXT: LUE weakness Neuro: Alert to self, disoriented to place and situation  SUMMARY OF RECOMMENDATIONS   Full Code / Full Scope of Care   Have emailed patient's daughter, Katharine Look a copy of the "Hard Choices for [employer]" book and a MOST form - Have called daughter to follow up on this though phone does not allow for me to leave a voicemail   Patients daughter unable to provide e/o legal guardian paperwork therefore we will request completion of advance directive with Chaplain   Ongoing incremental palliative team support   Code Status/Advance Care Planning: FULL CODE   Symptom Management:  Intractable nausea and vomiting - - Speech therapy - ongoing D1 Diet - Continue  IV reglan ATC - switch to oral tomorrow - Will DC Phenergan if not used in the next 24 hours - Continue bowel regimen - GI presently no plans to pursue EGD, conservative management  Generalized Weakness: - OOB to chair daily -  Aggressive PT/OT  Aspiration Risk: - Suction at bedside - Upright for meals - 1:1 feeding - D1 Diet as above  Time Spent: 35 Greater than 50% of the time was spent in counseling and coordination of care ______________________________________________________________________________________ [place] Team Team Cell Phone: [phone] Please utilize secure chat with additional questions, if there is no response within 30 minutes please call the above phone number  Palliative Medicine Team providers are available  by phone from 7am to 7pm daily and can be reached through the team cell phone.  Should this patient require assistance outside of these hours, please call the patient's attending physician.

## 2021-05-21 DIAGNOSIS — Z515 Encounter for palliative care: Secondary | ICD-10-CM | POA: Diagnosis not present

## 2021-05-21 LAB — BASIC METABOLIC PANEL
Anion gap: 8 (ref 5–15)
BUN: 19 mg/dL (ref 6–20)
CO2: 31 mmol/L (ref 22–32)
Calcium: 9.6 mg/dL (ref 8.9–10.3)
Chloride: 101 mmol/L (ref 98–111)
Creatinine, Ser: 1.53 mg/dL — ABNORMAL HIGH (ref 0.61–1.24)
GFR, Estimated: 52 mL/min — ABNORMAL LOW (ref >60)
Glucose, Bld: 114 mg/dL — ABNORMAL HIGH (ref 70–99)
Potassium: 3 mmol/L — ABNORMAL LOW (ref 3.5–5.1)
Sodium: 140 mmol/L (ref 135–145)

## 2021-05-21 LAB — CBC
HCT: 36.8 % — ABNORMAL LOW (ref 39.0–52.0)
Hemoglobin: 11.9 g/dL — ABNORMAL LOW (ref 13.0–17.0)
MCH: 27.5 pg (ref 26.0–34.0)
MCHC: 32.3 g/dL (ref 30.0–36.0)
MCV: 85.2 fL (ref 80.0–100.0)
Platelets: 204 10*3/uL (ref 150–400)
RBC: 4.32 MIL/uL (ref 4.22–5.81)
RDW: 13.6 % (ref 11.5–15.5)
WBC: 9.2 10*3/uL (ref 4.0–10.5)
nRBC: 0 % (ref 0.0–0.2)

## 2021-05-21 LAB — GLUCOSE, CAPILLARY
Glucose-Capillary: 108 mg/dL — ABNORMAL HIGH (ref 70–99)
Glucose-Capillary: 155 mg/dL — ABNORMAL HIGH (ref 70–99)
Glucose-Capillary: 177 mg/dL — ABNORMAL HIGH (ref 70–99)
Glucose-Capillary: 96 mg/dL (ref 70–99)
Glucose-Capillary: 119 mg/dL — ABNORMAL HIGH (ref 70–99)

## 2021-05-21 MED ORDER — LACTATED RINGERS IV SOLN: Freq: Once | INTRAVENOUS | Status: AC

## 2021-05-21 NOTE — Progress Notes (Signed)
PROGRESS NOTE    [name]  [id number] DOB: 1961/05/13 DOA: 05/15/2021 PCP: Boyce Medici, FNP   Brief Narrative:  [name] is a 60 y.o. 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 Presented with ongoing nausea and vomiting. Was recently admitted  for persistent nausea vomiting and constipation discharged on 05/14/2021 with resolution of symptoms. During patient's last hospitalization he had a CT of the abdomen pelvis which was negative and a head CT without acute findings.  Patient had been taking Zofran at his facility but it was not helping. During his recent admission was noted to have coffee-ground emesis felt to be secondary to Mallory-Weiss tear GI consulted not recommended holding off on EGD.  Initially nausea and vomiting resolved although it has resumed once patient went back to nursing home facility.  Hemoglobin did remain stable and he was added on PPI.  Patient representing with nausea vomiting extremely poor p.o. intake from facility for reevaluation, and GI to be consulted for their expertise.  Assessment & Plan:   Active Problems:   Hyperlipidemia associated with type 2 diabetes mellitus (HCC)   Hypokalemia   Schizophrenia ([place])   History of CVA with residual deficit   Chronic combined systolic (congestive) and diastolic (congestive) heart failure (HCC)   Benign essential HTN   Intractable nausea and vomiting   Intractable vomiting with nausea, unspecified vomiting type   Intractable nausea and vomiting secondary to dysphagia Rule out stricture/diverticulum -Continue to advance diet per speech -continues to have very poor p.o. intake despite clearance -Will move forward with PEG tube placement, patient is agreeable, will attempt to confirm with family and to ensure no further  questions prior to procedure in the next 24 hours (still no answer from Katharine Look despite multiple calls) - patient has capacity to make decisions but communication is limited. -Barium swallow remarkable for spillage and aspiration:       SLP Diet Recommendations: Dysphagia 1 (Puree) solids;Nectar thick liquid      Liquid Administration via: Cup;Straw      Medication Administration: Crushed with puree      Supervision: Full assist for feeding;Full supervision/cueing for compensatory strategies      Compensations: Small sips/bites;Slow rate      Postural Changes: Seated upright at 90 degrees;Remain semi-upright after after feeds/meals (Comment)      Oral Care Recommendations: Oral care BID  History of multiple CVAs with residual right-sided weakness-okay to resume antiplatelet agents per GI, he is also now on a PPI  Essential hypertension-continue home medications  Hyperlipidemia-continue statin History of schizophrenia-continue home medications Chronic combined CHF-appears euvolemic, continue home medications  Type 2 diabetes mellitus-continue home medications Constipation-resolved with aggressive bowel regimen, continue on discharge  DVT prophylaxis: None given previous episodes of hematemesis coffee-ground emesis Code Status: Full Family Communication: None present -no answer over the phone while attempting to call Katharine Look  Status is: Inpatient  Dispo: The patient is from: Facility              Anticipated d/c is to: Same              Anticipated d/c date is: 48 to 72 hours              Patient currently not medically stable for discharge  Consultants:  GI, [facility]  Procedures:  Pending above  Antimicrobials:  Not currently indicated  Subjective:  No acute issues or events overnight, continues to have issues tolerating p.o. appropriately  Objective: Vitals:   05/20/21 1615 05/20/21 2013 05/20/21 2330 05/21/21 0355  BP: (!) 138/100 (!) 124/97  (!) 143/96  Pulse: 93 90 97    Resp: '19 16  15  '$ Temp: 98.6 F (37 C) 99.4 F (37.4 C) (!) 100.5 F (38.1 C) 99.1 F (37.3 C)  TempSrc: Oral Oral Oral Oral  SpO2: 91% 98%    Weight:      Height:        Intake/Output Summary (Last 24 hours) at 05/21/2021 0747 Last data filed at 05/20/2021 1200 Gross per 24 hour  Intake 120 ml  Output 300 ml  Net -180 ml    Filed Weights   05/15/21 1421  Weight: 79.4 kg    Examination:  General:  Pleasantly resting in bed, No acute distress. HEENT: Right-sided facial droop with oral spillage Neck:  Without mass or deformity.  Trachea is midline. Lungs:  Clear to auscultate bilaterally without rhonchi, wheeze, or rales. Heart:  Regular rate and rhythm.  Without murmurs, rubs, or gallops. Abdomen:  Soft, nontender, nondistended.  Without guarding or rebound. Extremities: Without cyanosis, clubbing, edema Vascular:  Dorsalis pedis and posterior tibial pulses palpable bilaterally. Skin:  Warm and dry, no erythema, no ulcerations.   Data Reviewed: I have personally reviewed following labs and imaging studies  CBC: Recent Labs  Lab 05/15/21 1431 05/16/21 0057 05/17/21 0321 05/18/21 0151 05/19/21 0306 05/20/21 0338 05/21/21 0357  WBC 8.1 8.0 7.7 7.1 9.6 10.4 9.2  NEUTROABS 5.1 4.8  --   --   --   --   --   HGB 13.1 12.1* 12.3* 11.6* 12.7* 12.6* 11.9*  HCT 41.3 37.7* 38.6* 36.2* 38.6* 39.4 36.8*  MCV 85.2 85.1 84.5 85.4 83.9 85.3 85.2  PLT 224 192 206 209 207 203 0000000    Basic Metabolic Panel: Recent Labs  Lab 05/16/21 0057 05/16/21 0535 05/17/21 0321 05/18/21 0151 05/19/21 0306 05/20/21 0338 05/21/21 0357  NA 138  138  --  138 137 138 137 140  K 3.1*  3.1*  --  3.1* 3.0* 3.3* 3.0* 3.0*  CL 101  102  --  99 99 99 97* 101  CO2 29  28  --  '27 30 28 28 31  '$ GLUCOSE 107*  107*  --  111* 112* 135* 124* 114*  BUN 13  11  --  '9 11 10 13 19  '$ CREATININE 1.20  1.18  --  1.17 1.23 1.14 1.21 1.53*  CALCIUM 8.9  8.9  --  9.4 9.1 9.4 9.2 9.6  MG 1.7 1.6*  --    --   --   --   --   PHOS 3.0 2.7  --   --   --   --   --     GFR: Estimated Creatinine Clearance: 56.1 mL/min (A) (by C-G formula based on SCr of 1.53 mg/dL (H)). Liver Function Tests: Recent Labs  Lab 05/15/21 1431 05/16/21 0057  AST 14* 14*  ALT 7 7  ALKPHOS 82 71  BILITOT 1.0 0.8  PROT 7.9 7.0  ALBUMIN 4.3 3.9    Recent Labs  Lab 05/15/21 1431  LIPASE 37    No results for input(s): AMMONIA in the last 168 hours. Coagulation Profile: No results for input(s): INR, PROTIME in the last 168 hours. Cardiac Enzymes: Recent Labs  Lab 05/16/21 0057  CKTOTAL 110    BNP (last 3 results)  No results for input(s): PROBNP in the last 8760 hours. HbA1C: No results for input(s): HGBA1C in the last 72 hours.  CBG: Recent Labs  Lab 05/20/21 1357 05/20/21 1613 05/20/21 2021 05/20/21 2329 05/21/21 0353  GLUCAP 147* 131* 151* 185* 108*    Lipid Profile: No results for input(s): CHOL, HDL, LDLCALC, TRIG, CHOLHDL, LDLDIRECT in the last 72 hours. Thyroid Function Tests: No results for input(s): TSH, T4TOTAL, FREET4, T3FREE, THYROIDAB in the last 72 hours.  Anemia Panel: No results for input(s): VITAMINB12, FOLATE, FERRITIN, TIBC, IRON, RETICCTPCT in the last 72 hours. Sepsis Labs: Recent Labs  Lab 05/16/21 0057  LATICACIDVEN 0.9     Recent Results (from the past 240 hour(s))  Resp Panel by RT-PCR (Flu A&B, Covid) Nasopharyngeal Swab     Status: None   Collection Time: 05/13/21  3:30 AM   Specimen: Nasopharyngeal Swab; Nasopharyngeal(NP) swabs in vial transport medium  Result Value Ref Range Status   SARS Coronavirus 2 by RT PCR NEGATIVE NEGATIVE Final    Comment: (NOTE) SARS-CoV-2 target nucleic acids are NOT DETECTED.  The SARS-CoV-2 RNA is generally detectable in upper respiratory specimens during the acute phase of infection. The lowest concentration of SARS-CoV-2 viral copies this assay can detect is 138 copies/mL. A negative result does not preclude  SARS-Cov-2 infection and should not be used as the sole basis for treatment or other patient management decisions. A negative result may occur with  improper specimen collection/handling, submission of specimen other than nasopharyngeal swab, presence of viral mutation(s) within the areas targeted by this assay, and inadequate number of viral copies(<138 copies/mL). A negative result must be combined with clinical observations, patient history, and epidemiological information. The expected result is Negative.  Fact Sheet for Patients:  EntrepreneurPulse.com.au  Fact Sheet for Healthcare Providers:  IncredibleEmployment.be  This test is no t yet approved or cleared by the [place] FDA and  has been authorized for detection and/or diagnosis of SARS-CoV-2 by FDA under an Emergency Use Authorization (EUA). This EUA will remain  in effect (meaning this test can be used) for the duration of the COVID-19 declaration under Section 564(b)(1) of the Act, 21 U.S.C.section 360bbb-3(b)(1), unless the authorization is terminated  or revoked sooner.       Influenza A by PCR NEGATIVE NEGATIVE Final   Influenza B by PCR NEGATIVE NEGATIVE Final    Comment: (NOTE) The Xpert Xpress SARS-CoV-2/FLU/RSV plus assay is intended as an aid in the diagnosis of influenza from Nasopharyngeal swab specimens and should not be used as a sole basis for treatment. Nasal washings and aspirates are unacceptable for Xpert Xpress SARS-CoV-2/FLU/RSV testing.  Fact Sheet for Patients: EntrepreneurPulse.com.au  Fact Sheet for Healthcare Providers: IncredibleEmployment.be  This test is not yet approved or cleared by the [place] FDA and has been authorized for detection and/or diagnosis of SARS-CoV-2 by FDA under an Emergency Use Authorization (EUA). This EUA will remain in effect (meaning this test can be used) for the duration of  the COVID-19 declaration under Section 564(b)(1) of the Act, 21 U.S.C. section 360bbb-3(b)(1), unless the authorization is terminated or revoked.  Performed at [facility], [place] [address], [place], [place] [address]   SARS CORONAVIRUS 2 (TAT 6-24 HRS) Nasopharyngeal Nasopharyngeal Swab     Status: None   Collection Time: 05/15/21 10:14 PM   Specimen: Nasopharyngeal Swab  Result Value Ref Range Status   SARS Coronavirus 2 NEGATIVE NEGATIVE Final    Comment: (NOTE) SARS-CoV-2 target nucleic acids are  NOT DETECTED.  The SARS-CoV-2 RNA is generally detectable in upper and lower respiratory specimens during the acute phase of infection. Negative results do not preclude SARS-CoV-2 infection, do not rule out co-infections with other pathogens, and should not be used as the sole basis for treatment or other patient management decisions. Negative results must be combined with clinical observations, patient history, and epidemiological information. The expected result is Negative.  Fact Sheet for Patients: SugarRoll.be  Fact Sheet for Healthcare Providers: https://www.woods-mathews.com/  This test is not yet approved or cleared by the [place] FDA and  has been authorized for detection and/or diagnosis of SARS-CoV-2 by FDA under an Emergency Use Authorization (EUA). This EUA will remain  in effect (meaning this test can be used) for the duration of the COVID-19 declaration under Se ction 564(b)(1) of the Act, 21 U.S.C. section 360bbb-3(b)(1), unless the authorization is terminated or revoked sooner.  Performed at [facility], [place] [address], [place], [place] [address]   MRSA Next Gen by PCR, Nasal     Status: None   Collection Time: 05/16/21 11:24 AM   Specimen: Nasal Mucosa; Nasal Swab  Result Value Ref Range Status   MRSA by PCR Next Gen NOT DETECTED NOT DETECTED Final    Comment: (NOTE) The GeneXpert MRSA  Assay (FDA approved for NASAL specimens only), is one component of a comprehensive MRSA colonization surveillance program. It is not intended to diagnose MRSA infection nor to guide or monitor treatment for MRSA infections. Test performance is not FDA approved in patients less than 16 years old. Performed at [facility], [place] [address] [place], [place] [address]        Radiology Studies: No results found.  Scheduled Meds:  amLODipine  10 mg Oral q morning   atorvastatin  40 mg Oral q1800   carvedilol  25 mg Oral BID WC   chlorthalidone  50 mg Oral Daily   clopidogrel  75 mg Oral q morning   feeding supplement  237 mL Oral TID BM   FLUoxetine  10 mg Oral Daily   insulin aspart  0-9 Units Subcutaneous Q4H   losartan  75 mg Oral Daily   metoCLOPramide (REGLAN) injection  10 mg Intravenous Q8H   multivitamin with minerals  1 tablet Oral Daily   paliperidone  6 mg Oral q morning   pantoprazole (PROTONIX) IV  40 mg Intravenous QHS   polyethylene glycol  17 g Oral BID   senna  2 tablet Oral QHS   Continuous Infusions:  promethazine (PHENERGAN) injection (IM or IVPB)       LOS: 5 days   Time spent: 38mn  Konstantine Gervasi C Teejay Meader, DO [facility]  If 7PM-7AM, please contact night-coverage [email or web address]  05/21/2021, 7:47 AM

## 2021-05-21 NOTE — Progress Notes (Signed)
  Speech Language Pathology Treatment: Dysphagia  Patient Details Name: Arshan Villareal MRN: [id number] DOB: [birth year] Today's Date: 05/21/2021 Time: [id number] SLP Time Calculation (min) (ACUTE ONLY): 12 min  Assessment / Plan / Recommendation Clinical Impression  Pt was seen for dysphagia treatment. He continues to have need for frequent oral suctioning to assist with secretion management. Pt reported that he has been tolerating the meals similarly to last week and without s/sx of aspiration, but continues to have emesis with p.o. intake. Pt tolerated puree solids and nectar thick liquids without overt s/sx of aspiration. Mastication was prolonged with dysphagia 2 solids and pt inconsistently demonstrated coughing thereafter. Residue was noted in the right lateral sulcus, but amount of residue was improved with physical assistance for pt to maintain his head closer to midline. A diet upgrade is not clinically indicated at this time, but he does appear to be tolerating the current diet from an oropharyngeal standpoint. SLP will continue to follow pt.    HPI HPI: Pt is a 60 y.o. male with ongoing nausea and vomiting after recent discharge on 05/14/21. During recent admission pt noted to have coffee-ground emesis felt to be secondary to Mallory-Weiss tear GI consulted but recommended holding off on EGD. Palliative Care: full scope, full code. 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 MBS 04/19/20:  Oropharyngeal dysphagia with left labial weakness, and a pharyngeal delay; regular texture diet and thin liquids recommended      SLP Plan  Continue with current plan of care       Recommendations  Diet recommendations: Dysphagia 1 (puree);Nectar-thick liquid Liquids provided via: Cup;Straw Medication Administration: Whole meds with puree Supervision: Staff to  assist with self feeding;Full supervision/cueing for compensatory strategies Compensations: Small sips/bites;Slow rate                Oral Care Recommendations: Oral care BID Follow up Recommendations:  (TBD) SLP Visit Diagnosis: Dysphagia, unspecified (R13.10) Plan: Continue with current plan of care       Aubryn Spinola I. Hardin Negus, [place], [place] Office number [phone] Pager [phone]                Horton Marshall 05/21/2021, 9:42 AM

## 2021-05-21 NOTE — TOC Progression Note (Signed)
Transition of Care [facility]) - Progression Note    Patient Details  Name: Cosby Birky MRN: [id number] Date of Birth: 1961-11-05  [facility] CM/SW Contact  Joanne Chars, LCSW Phone Number: 05/21/2021, 9:05 AM  Clinical Narrative:   PASSR received: [id number],  05/18/21-06/17/21    Expected Discharge Plan: [place] Barriers to Discharge: Continued Medical Work up, SNF Pending bed offer  Expected Discharge Plan and Services Expected Discharge Plan: [place] Choice: [place] arrangements for the past 2 months: [place]                                       Social Determinants of Health (SDOH) Interventions    Readmission Risk Interventions No flowsheet data found.

## 2021-05-22 DIAGNOSIS — Z515 Encounter for palliative care: Secondary | ICD-10-CM | POA: Diagnosis not present

## 2021-05-22 LAB — GLUCOSE, CAPILLARY
Glucose-Capillary: 120 mg/dL — ABNORMAL HIGH (ref 70–99)
Glucose-Capillary: 126 mg/dL — ABNORMAL HIGH (ref 70–99)
Glucose-Capillary: 115 mg/dL — ABNORMAL HIGH (ref 70–99)
Glucose-Capillary: 126 mg/dL — ABNORMAL HIGH (ref 70–99)
Glucose-Capillary: 112 mg/dL — ABNORMAL HIGH (ref 70–99)
Glucose-Capillary: 143 mg/dL — ABNORMAL HIGH (ref 70–99)

## 2021-05-22 MED ORDER — OSMOLITE 1.2 CAL PO LIQD
1000.0000 mL | ORAL | Status: DC
  Administered 2021-05-23: 1000 mL
  Filled 2021-05-22 (×3): qty 1000

## 2021-05-22 NOTE — Progress Notes (Signed)
Pt. Vomited x2, PRN given.

## 2021-05-22 NOTE — Progress Notes (Signed)
PROGRESS NOTE    [name]  [id number] DOB: Aug 31, 1961 DOA: 05/15/2021 PCP: Boyce Medici, FNP   Brief Narrative:  [name] is a 60 y.o. 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 Presented with ongoing nausea and vomiting. Was recently admitted  for persistent nausea vomiting and constipation discharged on 05/14/2021 with resolution of symptoms. During patient's last hospitalization he had a CT of the abdomen pelvis which was negative and a head CT without acute findings.  Patient had been taking Zofran at his facility but it was not helping. During his recent admission was noted to have coffee-ground emesis felt to be secondary to Mallory-Weiss tear GI consulted not recommended holding off on EGD.  Initially nausea and vomiting resolved although it has resumed once patient went back to nursing home facility.  Hemoglobin did remain stable and he was added on PPI.  Patient representing with nausea vomiting extremely poor p.o. intake from facility for reevaluation, and GI to be consulted for their expertise.  Assessment & Plan:   Active Problems:   Hyperlipidemia associated with type 2 diabetes mellitus (HCC)   Hypokalemia   Schizophrenia (HCC)   History of CVA with residual deficit   Chronic combined systolic (congestive) and diastolic (congestive) heart failure (HCC)   Benign essential HTN   Intractable nausea and vomiting   Intractable vomiting with nausea, unspecified vomiting type  Intractable nausea and vomiting secondary to dysphagia Rule out stricture/diverticulum -Continue to advance diet per speech -continues to have very poor p.o. intake despite clearance -G-tube placement ordered via IR, patient agreeable -understands the risks and benefits of the procedure, agreeable at  bedside. -Barium swallow remarkable for spillage and aspiration -continue to advance diet per speech, feeding tube placement for definitive route for medications nutrition and fluids given patient's limited intake.  History of multiple CVAs with residual right-sided weakness-okay to resume antiplatelet agents per GI, he is also now on a PPI  Essential hypertension-continue home medications  Hyperlipidemia-continue statin History of schizophrenia-continue home medications Chronic combined CHF-appears euvolemic, continue home medications  Type 2 diabetes mellitus-continue home medications Constipation-resolved with aggressive bowel regimen, continue on discharge  DVT prophylaxis: None given previous episodes of hematemesis coffee-ground emesis Code Status: Full Family Communication: None present - Katharine Look reportedly coming to visit today we will attempt to communicate plan at bedside  Status is: Inpatient  Dispo: The patient is from: Facility              Anticipated d/c is to: Same              Anticipated d/c date is: 48 to 72 hours              Patient currently not medically stable for discharge  Consultants:  GI, [facility]  Procedures:  G-tube placement  Antimicrobials:  Not currently indicated  Subjective: No acute issues or events overnight, continues to have issues tolerating appropriate p.o. intake, states he feels weak and tired somewhat fatigued but denies chest pain shortness of breath fevers or chills.  Objective: Vitals:   05/21/21 1547 05/21/21 1931 05/22/21 0037 05/22/21 0438  BP: (!) 133/103 134/85 124/87 (!) 155/99  Pulse: 82  80 89  Resp: '15  12 20  '$ Temp: 98.8 F (37.1 C) 99.3 F (37.4 C) 99 F (37.2 C) 99.4 F (37.4 C)  TempSrc: Axillary Oral Oral Axillary  SpO2:  92%  93% 98%  Weight:      Height:        Intake/Output Summary (Last 24 hours) at 05/22/2021 0727 Last data filed at 05/22/2021 0300 Gross per 24 hour  Intake 160 ml  Output 475 ml  Net -315  ml    Filed Weights   05/15/21 1421  Weight: 79.4 kg    Examination:  General:  Pleasantly resting in bed, No acute distress. HEENT: Right-sided facial droop with oral spillage Neck:  Without mass or deformity.  Trachea is midline. Lungs:  Clear to auscultate bilaterally without rhonchi, wheeze, or rales. Heart:  Regular rate and rhythm.  Without murmurs, rubs, or gallops. Abdomen:  Soft, nontender, nondistended.  Without guarding or rebound. Extremities: Without cyanosis, clubbing, edema Vascular:  Dorsalis pedis and posterior tibial pulses palpable bilaterally. Skin:  Warm and dry, no erythema, no ulcerations.   Data Reviewed: I have personally reviewed following labs and imaging studies  CBC: Recent Labs  Lab 05/15/21 1431 05/16/21 0057 05/17/21 0321 05/18/21 0151 05/19/21 0306 05/20/21 0338 05/21/21 0357  WBC 8.1 8.0 7.7 7.1 9.6 10.4 9.2  NEUTROABS 5.1 4.8  --   --   --   --   --   HGB 13.1 12.1* 12.3* 11.6* 12.7* 12.6* 11.9*  HCT 41.3 37.7* 38.6* 36.2* 38.6* 39.4 36.8*  MCV 85.2 85.1 84.5 85.4 83.9 85.3 85.2  PLT 224 192 206 209 207 203 0000000    Basic Metabolic Panel: Recent Labs  Lab 05/16/21 0057 05/16/21 0535 05/17/21 0321 05/18/21 0151 05/19/21 0306 05/20/21 0338 05/21/21 0357  NA 138  138  --  138 137 138 137 140  K 3.1*  3.1*  --  3.1* 3.0* 3.3* 3.0* 3.0*  CL 101  102  --  99 99 99 97* 101  CO2 29  28  --  '27 30 28 28 31  '$ GLUCOSE 107*  107*  --  111* 112* 135* 124* 114*  BUN 13  11  --  '9 11 10 13 19  '$ CREATININE 1.20  1.18  --  1.17 1.23 1.14 1.21 1.53*  CALCIUM 8.9  8.9  --  9.4 9.1 9.4 9.2 9.6  MG 1.7 1.6*  --   --   --   --   --   PHOS 3.0 2.7  --   --   --   --   --     GFR: Estimated Creatinine Clearance: 56.1 mL/min (A) (by C-G formula based on SCr of 1.53 mg/dL (H)). Liver Function Tests: Recent Labs  Lab 05/15/21 1431 05/16/21 0057  AST 14* 14*  ALT 7 7  ALKPHOS 82 71  BILITOT 1.0 0.8  PROT 7.9 7.0  ALBUMIN 4.3 3.9     Recent Labs  Lab 05/15/21 1431  LIPASE 37    No results for input(s): AMMONIA in the last 168 hours. Coagulation Profile: No results for input(s): INR, PROTIME in the last 168 hours. Cardiac Enzymes: Recent Labs  Lab 05/16/21 0057  CKTOTAL 110    BNP (last 3 results) No results for input(s): PROBNP in the last 8760 hours. HbA1C: No results for input(s): HGBA1C in the last 72 hours.  CBG: Recent Labs  Lab 05/21/21 1147 05/21/21 1545 05/21/21 1929 05/22/21 0202 05/22/21 0554  GLUCAP 177* 96 119* 120* 126*    Lipid Profile: No results for input(s): CHOL, HDL, LDLCALC, TRIG, CHOLHDL, LDLDIRECT in the last 72 hours. Thyroid Function Tests: No results for input(s): TSH, T4TOTAL, FREET4,  T3FREE, THYROIDAB in the last 72 hours.  Anemia Panel: No results for input(s): VITAMINB12, FOLATE, FERRITIN, TIBC, IRON, RETICCTPCT in the last 72 hours. Sepsis Labs: Recent Labs  Lab 05/16/21 0057  LATICACIDVEN 0.9     Recent Results (from the past 240 hour(s))  Resp Panel by RT-PCR (Flu A&B, Covid) Nasopharyngeal Swab     Status: None   Collection Time: 05/13/21  3:30 AM   Specimen: Nasopharyngeal Swab; Nasopharyngeal(NP) swabs in vial transport medium  Result Value Ref Range Status   SARS Coronavirus 2 by RT PCR NEGATIVE NEGATIVE Final    Comment: (NOTE) SARS-CoV-2 target nucleic acids are NOT DETECTED.  The SARS-CoV-2 RNA is generally detectable in upper respiratory specimens during the acute phase of infection. The lowest concentration of SARS-CoV-2 viral copies this assay can detect is 138 copies/mL. A negative result does not preclude SARS-Cov-2 infection and should not be used as the sole basis for treatment or other patient management decisions. A negative result may occur with  improper specimen collection/handling, submission of specimen other than nasopharyngeal swab, presence of viral mutation(s) within the areas targeted by this assay, and inadequate  number of viral copies(<138 copies/mL). A negative result must be combined with clinical observations, patient history, and epidemiological information. The expected result is Negative.  Fact Sheet for Patients:  EntrepreneurPulse.com.au  Fact Sheet for Healthcare Providers:  IncredibleEmployment.be  This test is no t yet approved or cleared by the [place] FDA and  has been authorized for detection and/or diagnosis of SARS-CoV-2 by FDA under an Emergency Use Authorization (EUA). This EUA will remain  in effect (meaning this test can be used) for the duration of the COVID-19 declaration under Section 564(b)(1) of the Act, 21 U.S.C.section 360bbb-3(b)(1), unless the authorization is terminated  or revoked sooner.       Influenza A by PCR NEGATIVE NEGATIVE Final   Influenza B by PCR NEGATIVE NEGATIVE Final    Comment: (NOTE) The Xpert Xpress SARS-CoV-2/FLU/RSV plus assay is intended as an aid in the diagnosis of influenza from Nasopharyngeal swab specimens and should not be used as a sole basis for treatment. Nasal washings and aspirates are unacceptable for Xpert Xpress SARS-CoV-2/FLU/RSV testing.  Fact Sheet for Patients: EntrepreneurPulse.com.au  Fact Sheet for Healthcare Providers: IncredibleEmployment.be  This test is not yet approved or cleared by the [place] FDA and has been authorized for detection and/or diagnosis of SARS-CoV-2 by FDA under an Emergency Use Authorization (EUA). This EUA will remain in effect (meaning this test can be used) for the duration of the COVID-19 declaration under Section 564(b)(1) of the Act, 21 U.S.C. section 360bbb-3(b)(1), unless the authorization is terminated or revoked.  Performed at [facility], [place] [address], [place], [place] [address]   SARS CORONAVIRUS 2 (TAT 6-24 HRS) Nasopharyngeal Nasopharyngeal Swab     Status: None    Collection Time: 05/15/21 10:14 PM   Specimen: Nasopharyngeal Swab  Result Value Ref Range Status   SARS Coronavirus 2 NEGATIVE NEGATIVE Final    Comment: (NOTE) SARS-CoV-2 target nucleic acids are NOT DETECTED.  The SARS-CoV-2 RNA is generally detectable in upper and lower respiratory specimens during the acute phase of infection. Negative results do not preclude SARS-CoV-2 infection, do not rule out co-infections with other pathogens, and should not be used as the sole basis for treatment or other patient management decisions. Negative results must be combined with clinical observations, patient history, and epidemiological information. The expected result is Negative.  Fact Sheet for  Patients: SugarRoll.be  Fact Sheet for Healthcare Providers: https://www.woods-mathews.com/  This test is not yet approved or cleared by the [place] FDA and  has been authorized for detection and/or diagnosis of SARS-CoV-2 by FDA under an Emergency Use Authorization (EUA). This EUA will remain  in effect (meaning this test can be used) for the duration of the COVID-19 declaration under Se ction 564(b)(1) of the Act, 21 U.S.C. section 360bbb-3(b)(1), unless the authorization is terminated or revoked sooner.  Performed at [facility], [place] [address] [place], [place] [address]   MRSA Next Gen by PCR, Nasal     Status: None   Collection Time: 05/16/21 11:24 AM   Specimen: Nasal Mucosa; Nasal Swab  Result Value Ref Range Status   MRSA by PCR Next Gen NOT DETECTED NOT DETECTED Final    Comment: (NOTE) The GeneXpert MRSA Assay (FDA approved for NASAL specimens only), is one component of a comprehensive MRSA colonization surveillance program. It is not intended to diagnose MRSA infection nor to guide or monitor treatment for MRSA infections. Test performance is not FDA approved in patients less than 82 years old. Performed at [facility], [place] [address], [place], [place] [address]        Radiology Studies: No results found.  Scheduled Meds:  amLODipine  10 mg Oral q morning   atorvastatin  40 mg Oral q1800   carvedilol  25 mg Oral BID WC   chlorthalidone  50 mg Oral Daily   clopidogrel  75 mg Oral q morning   feeding supplement  237 mL Oral TID BM   FLUoxetine  10 mg Oral Daily   insulin aspart  0-9 Units Subcutaneous Q4H   losartan  75 mg Oral Daily   metoCLOPramide (REGLAN) injection  10 mg Intravenous Q8H   multivitamin with minerals  1 tablet Oral Daily   paliperidone  6 mg Oral q morning   pantoprazole (PROTONIX) IV  40 mg Intravenous QHS   polyethylene glycol  17 g Oral BID   senna  2 tablet Oral QHS   Continuous Infusions:  promethazine (PHENERGAN) injection (IM or IVPB)       LOS: 6 days   Time spent: 35 min  Little Ishikawa, DO [facility]  If 7PM-7AM, please contact night-coverage [email or web address]  05/22/2021, 7:27 AM

## 2021-05-22 NOTE — Plan of Care (Signed)

## 2021-05-22 NOTE — Progress Notes (Signed)
Physical Therapy Treatment Patient Details Name: [name] MRN: [id number] DOB: [birth year] Today's Date: 05/22/2021    History of Present Illness 60 y.o. male presenting to ED 6/28 from [place] with intractable N/V. Patient with recent admission 05/14/2021 with similar complaint. GI consult not recommended at that time. PMHx significant for multiple CVA's with residual R hemiparesis, dysarthria and neurocognitive impairment, CHF, DMII, HTN, schizoaffective disorder, chronic L hip pain and polysubstance abuse.    PT Comments    Patient willing to participate with plan to get OOB to wheelchair via lift, despite having recently vomited. After rolling for cleaning and placing lift pad, pt again vomited large amount and decided to defer OOB at this time due to frequent vomiting. Pt again cleaned and pad removed with lots of rolling practice.   Attempted to call [facility] to better understand patient's baseline functional abilities. Informed pt is not a resident there and they tried to put me through to someone who might know him, but rang to voicemail.   Will continue with trial of PT as pt wants to work on getting OOB and potentially using wheelchair.     Follow Up Recommendations  SNF     Equipment Recommendations  Other (comment) (defer)    Recommendations for Other Services       Precautions / Restrictions Precautions Precautions: Fall Precaution Comments: R hemiparesis, significant debility, risk of skin breakdown, frequent dry heaving and vomiting    Mobility  Bed Mobility Overal bed mobility: Needs Assistance Bed Mobility: Rolling Rolling: Total assist;+2 for physical assistance         General bed mobility comments: pt with difficulty following instructions when rolling to either side; emphasized similar sequence (bend knees, look toward side rolling toward, reach across with UE; unable to proceed to sit EOB due to vomiting     Transfers                 General transfer comment: NT - n/v  Ambulation/Gait                 Stairs             Wheelchair Mobility    Modified Rankin (Stroke Patients Only) Modified Rankin (Stroke Patients Only) Pre-Morbid Rankin Score: Severe disability Modified Rankin: Severe disability     Balance                                            Cognition Arousal/Alertness: Awake/alert Behavior During Therapy: Flat affect Overall Cognitive Status: No family/caregiver present to determine baseline cognitive functioning Area of Impairment: Attention;Following commands;Problem solving                   Current Attention Level: Sustained   Following Commands: Follows one step commands with increased time Safety/Judgement: Decreased awareness of deficits   Problem Solving: Slow processing;Difficulty sequencing;Requires verbal cues;Requires tactile cues;Decreased initiation General Comments: difficulty with rolling despite cues and multiple attempts to either side to clean pt after vomiting twice during session      Exercises      General Comments General comments (skin integrity, edema, etc.): pt had vomited on himself in supine with [facility] elevated;requested nursing assistance to clean patient; once clean, placed maximove sling under pt and pt then vomited again, requiring a second bout of cleaning. Pad removed and pt in bed with  HOB elevated at end of session      Pertinent Vitals/Pain Faces Pain Scale: No hurt Pain Location: all over, generalized with movement Pain Descriptors / Indicators: Discomfort;Aching;Sore    Home Living Family/patient expects to be discharged to:: Skilled nursing facility               Additional Comments: From [facility]    Prior Function Level of Independence: Needs assistance  Gait / Transfers Assistance Needed: Bedbound at baseline; reports hoyer lift for transfer to wc ADL's /  Homemaking Assistance Needed: Assist with all ADLs including grooming and self-feeding.     PT Goals (current goals can now be found in the care plan section) Acute Rehab PT Goals Patient Stated Goal: go to rehab Time For Goal Achievement: 05/30/21 Potential to Achieve Goals: Good Progress towards PT goals: Not progressing toward goals - comment (due to vomiting)    Frequency    Min 2X/week      PT Plan Current plan remains appropriate (attempted to contact [facility] for info on baseline functional abilities. Told pt is no longer a resident there and no staff available to answer my questions. Will proceed with PT trial for ability to progress,)    Co-evaluation              AM-PAC PT "6 Clicks" Mobility   Outcome Measure  Help needed turning from your back to your side while in a flat bed without using bedrails?: Total Help needed moving from lying on your back to sitting on the side of a flat bed without using bedrails?: Total Help needed moving to and from a bed to a chair (including a wheelchair)?: Total Help needed standing up from a chair using your arms (e.g., wheelchair or bedside chair)?: Total Help needed to walk in hospital room?: Total Help needed climbing 3-5 steps with a railing? : Total 6 Click Score: 6    End of Session   Activity Tolerance: Treatment limited secondary to medical complications (Comment) (limited by nausea/vomiting) Patient left: in bed;with bed alarm set;with call bell/phone within reach Nurse Communication: Other (comment);Need for lift equipment (vomited x 2) PT Visit Diagnosis: Other abnormalities of gait and mobility (R26.89);Muscle weakness (generalized) (M62.81)     Time: [id number] PT Time Calculation (min) (ACUTE ONLY): 40 min  Charges:  $Therapeutic Activity: 23-37 mins                      Arby Barrette, PT Pager [phone]    Rexanne Mano 05/22/2021, 2:30 PM

## 2021-05-23 ENCOUNTER — Inpatient Hospital Stay (HOSPITAL_COMMUNITY): Payer: Medicaid Other

## 2021-05-23 DIAGNOSIS — Z7189 Other specified counseling: Secondary | ICD-10-CM | POA: Diagnosis not present

## 2021-05-23 DIAGNOSIS — Z515 Encounter for palliative care: Secondary | ICD-10-CM | POA: Diagnosis not present

## 2021-05-23 LAB — CBC
HCT: 38.4 % — ABNORMAL LOW (ref 39.0–52.0)
Hemoglobin: 12.3 g/dL — ABNORMAL LOW (ref 13.0–17.0)
MCH: 27.6 pg (ref 26.0–34.0)
MCHC: 32 g/dL (ref 30.0–36.0)
MCV: 86.1 fL (ref 80.0–100.0)
Platelets: 219 10*3/uL (ref 150–400)
RBC: 4.46 MIL/uL (ref 4.22–5.81)
RDW: 13.5 % (ref 11.5–15.5)
WBC: 8 10*3/uL (ref 4.0–10.5)
nRBC: 0 % (ref 0.0–0.2)

## 2021-05-23 LAB — GLUCOSE, CAPILLARY
Glucose-Capillary: 112 mg/dL — ABNORMAL HIGH (ref 70–99)
Glucose-Capillary: 113 mg/dL — ABNORMAL HIGH (ref 70–99)
Glucose-Capillary: 114 mg/dL — ABNORMAL HIGH (ref 70–99)
Glucose-Capillary: 119 mg/dL — ABNORMAL HIGH (ref 70–99)
Glucose-Capillary: 135 mg/dL — ABNORMAL HIGH (ref 70–99)
Glucose-Capillary: 137 mg/dL — ABNORMAL HIGH (ref 70–99)
Glucose-Capillary: 140 mg/dL — ABNORMAL HIGH (ref 70–99)

## 2021-05-23 LAB — BASIC METABOLIC PANEL
Anion gap: 9 (ref 5–15)
BUN: 19 mg/dL (ref 6–20)
CO2: 32 mmol/L (ref 22–32)
Calcium: 9.4 mg/dL (ref 8.9–10.3)
Chloride: 101 mmol/L (ref 98–111)
Creatinine, Ser: 1.17 mg/dL (ref 0.61–1.24)
GFR, Estimated: >60 mL/min (ref >60)
Glucose, Bld: 132 mg/dL — ABNORMAL HIGH (ref 70–99)
Potassium: 3.2 mmol/L — ABNORMAL LOW (ref 3.5–5.1)
Sodium: 142 mmol/L (ref 135–145)

## 2021-05-23 MED ORDER — PROSOURCE TF PO LIQD
45.0000 mL | Freq: Two times a day (BID) | ORAL | Status: DC
  Administered 2021-05-23 – 2021-05-24 (×3): 45 mL
  Filled 2021-05-23 (×4): qty 45

## 2021-05-23 MED ORDER — LOSARTAN POTASSIUM 50 MG PO TABS
75.0000 mg | ORAL_TABLET | Freq: Every day | ORAL | Status: DC
  Administered 2021-05-24: 75 mg
  Filled 2021-05-23: qty 3
  Filled 2021-05-23: qty 1

## 2021-05-23 MED ORDER — FREE WATER
100.0000 mL | Freq: Four times a day (QID) | Status: DC
  Administered 2021-05-23: 100 mL

## 2021-05-23 MED ORDER — POLYETHYLENE GLYCOL 3350 17 G PO PACK: 17.0000 g | PACK | Freq: Two times a day (BID) | ORAL | Status: DC

## 2021-05-23 MED ORDER — METOCLOPRAMIDE HCL 5 MG/5ML PO SOLN
10.0000 mg | Freq: Three times a day (TID) | ORAL | Status: DC
  Administered 2021-05-23: 10 mg via ORAL
  Filled 2021-05-23 (×2): qty 10

## 2021-05-23 MED ORDER — FLUOXETINE HCL 10 MG PO CAPS: 10.0000 mg | ORAL_CAPSULE | Freq: Every day | ORAL | Status: DC

## 2021-05-23 MED ORDER — GUAIFENESIN-DM 100-10 MG/5ML PO SYRP
15.0000 mL | ORAL_SOLUTION | Freq: Three times a day (TID) | ORAL | Status: DC | PRN
  Administered 2021-05-24: 15 mL
  Filled 2021-05-23: qty 15

## 2021-05-23 MED ORDER — CARVEDILOL 25 MG PO TABS
25.0000 mg | ORAL_TABLET | Freq: Two times a day (BID) | ORAL | Status: DC
  Administered 2021-05-23 – 2021-05-24 (×3): 25 mg
  Filled 2021-05-23 (×4): qty 1

## 2021-05-23 MED ORDER — CHLORTHALIDONE 50 MG PO TABS
50.0000 mg | ORAL_TABLET | Freq: Every day | ORAL | Status: DC
  Administered 2021-05-24 – 2021-05-26 (×2): 50 mg
  Filled 2021-05-23 (×3): qty 1

## 2021-05-23 MED ORDER — FREE WATER
200.0000 mL | Status: DC
  Administered 2021-05-23 – 2021-06-03 (×21): 200 mL

## 2021-05-23 MED ORDER — OSMOLITE 1.5 CAL PO LIQD
1000.0000 mL | ORAL | Status: DC
  Administered 2021-05-23: 1000 mL
  Filled 2021-05-23 (×4): qty 1000

## 2021-05-23 MED ORDER — FLUOXETINE HCL 20 MG/5ML PO SOLN
10.0000 mg | Freq: Every day | ORAL | Status: DC
  Administered 2021-05-24: 10 mg
  Filled 2021-05-23 (×3): qty 5

## 2021-05-23 MED ORDER — AMLODIPINE BESYLATE 10 MG PO TABS
10.0000 mg | ORAL_TABLET | Freq: Every morning | ORAL | Status: DC
  Administered 2021-05-24: 10 mg
  Filled 2021-05-23: qty 1

## 2021-05-23 MED ORDER — METOCLOPRAMIDE HCL 5 MG/5ML PO SOLN
10.0000 mg | Freq: Three times a day (TID) | ORAL | Status: DC
  Administered 2021-05-23 – 2021-05-24 (×4): 10 mg
  Filled 2021-05-23 (×12): qty 10

## 2021-05-23 MED ORDER — ONDANSETRON HCL 4 MG PO TABS: 4.0000 mg | ORAL_TABLET | Freq: Four times a day (QID) | ORAL | Status: DC | PRN

## 2021-05-23 MED ORDER — ACETAMINOPHEN 325 MG PO TABS: 650.0000 mg | ORAL_TABLET | Freq: Four times a day (QID) | ORAL | Status: DC | PRN

## 2021-05-23 MED ORDER — ACETAMINOPHEN 650 MG RE SUPP: 650.0000 mg | Freq: Four times a day (QID) | RECTAL | Status: DC | PRN

## 2021-05-23 MED ORDER — PANTOPRAZOLE SODIUM 40 MG PO PACK
40.0000 mg | PACK | Freq: Every day | ORAL | Status: DC
  Administered 2021-05-23 – 2021-05-24 (×2): 40 mg
  Filled 2021-05-23 (×3): qty 20

## 2021-05-23 MED ORDER — ATORVASTATIN CALCIUM 40 MG PO TABS
40.0000 mg | ORAL_TABLET | Freq: Every day | ORAL | Status: DC
  Administered 2021-05-23 – 2021-05-24 (×2): 40 mg
  Filled 2021-05-23 (×2): qty 1

## 2021-05-23 MED ORDER — ADULT MULTIVITAMIN W/MINERALS CH
1.0000 | ORAL_TABLET | Freq: Every day | ORAL | Status: DC
  Administered 2021-05-24: 1
  Filled 2021-05-23 (×2): qty 1

## 2021-05-23 MED ORDER — SENNA 8.6 MG PO TABS
2.0000 | ORAL_TABLET | Freq: Every day | ORAL | Status: DC
  Administered 2021-05-23 – 2021-05-24 (×2): 17.2 mg
  Filled 2021-05-23 (×2): qty 2

## 2021-05-23 NOTE — Progress Notes (Signed)
Palliative Medicine Inpatient Follow Up Note  Reason for consult:  Goals of Care   HPI:  Per intake H&P --> 60 y/o male with prior hx of EtOH abuse, cocaine abuse, DM2, malignant HTN, schizophrenia, chronic combined CHF, multiple prior strokes with persistent dysarthria and SNF residence at this time he was most recently seen in the hospital and discharged on 05/14/2021 after having persistent nausea vomiting and constipation.   Palliative care was consulted for goals of care conversations in the setting of two recent inpatient admissions and multiple co-morbidities.   Today's Discussion (05/23/2021):  *Please note that this is a verbal dictation therefore any spelling or grammatical errors are due to the "[place] One" system interpretation.  Chart reviewed. Plan for core-track placement today.  I met [name] at bedside this morning. He shares that he is having some discomfort in his stomach. We reviewed that he has had a bowel movement today. Moved [name] in bed to see if that helped. Reviewed that I will order a K-Pad for abdominal discomfort.  I sat with [name] and reviewed the past seven days of his hospitalization. We reviewed his poor appetite and high aspiration risk. I asked him again what his wishes are in regards to resuscitation. He again stated "I want to live." I asked him if he were confined to the bed if that would be an acceptable quality of living. It took him a while to respond but he responded with a "yes". I asked him if he would want a tube in his belly for nutrition which he also is in agreement with. I shared that these are big decisions and I would most appreciate his daughters involvement with all of them. He consented to me calling her this morning.   I was able to call [name] this morning though, again, she did not answer and her VM was full therefore I was unable to leave a message.   I talked to patients RN, Britt Bottom about the plan for the day.   Questions and  concerns addressed   Objective Assessment: Vital Signs Vitals:   05/23/21 0800 05/23/21 0818  BP:  (!) 155/133  Pulse:  86  Resp:  (!) 22  Temp:  98.3 F (36.8 C)  SpO2: 98% 100%    Intake/Output Summary (Last 24 hours) at 05/23/2021 1154 Last data filed at 05/23/2021 0836 Gross per 24 hour  Intake 160 ml  Output 275 ml  Net -115 ml    Last Weight  Most recent update: 05/23/2021  5:19 AM    Weight  80.1 kg (176 lb 9.4 oz)            Gen: Middle-aged African-American man in no acute distress HEENT: moist mucous membranes CV: Regular rate and rhythm PULM: On room air ABD: soft/nontender EXT: LUE weakness Neuro: Alert to self, disoriented to place and situation  SUMMARY OF RECOMMENDATIONS   Full Code / Full Scope of Care  Plan for coretrack placement today --> IR to place PEG next weeks   Have emailed patient's daughter, [name] a copy of the "Hard Choices for [employer]" book and a MOST form - Have called daughter to follow up on this though multiple times --> phone does not allow for me to leave a voicemail   Appreciate Chaplain completing [place] documents  From a social perspective will need long term placement   Ongoing incremental palliative team support   Code Status/Advance Care Planning: FULL CODE   Symptom Management:  Intractable  nausea and vomiting - - Continue  reglan oral solution Q8H - Continue Zofran PRN - Will DC Phenergan   - Continue bowel regimen - GI --> conservative management  Generalized Weakness: - OOB to chair daily - Aggressive PT/OT  Aspiration Risk: - Suction at bedside - Upright for meals - 1:1 feeding  Malnutrition: - Coretrack in Place - Plan for PEG next week through IR  Time Spent: 35 Greater than 50% of the time was spent in counseling and coordination of care ______________________________________________________________________________________ [place] Team Team Cell  Phone: [phone] Please utilize secure chat with additional questions, if there is no response within 30 minutes please call the above phone number  Palliative Medicine Team providers are available by phone from 7am to 7pm daily and can be reached through the team cell phone.  Should this patient require assistance outside of these hours, please call the patient's attending physician.

## 2021-05-23 NOTE — Progress Notes (Addendum)
This chaplain is present with the Pt., notary, and two witnesses for notarizing of the Pt. Advance Directive:  HCPOA.  The Pt. named [name] as his healthcare agent.  The chaplain gave the Pt. the original AD and one copy.  The documents were placed in a Pt. Property bag in the wardrobe. The chaplain scanned a copy to the Pt. EMR. The Pt. RN-Sujata was updated.  This chaplain is available for F/U spiritual care as needed.

## 2021-05-23 NOTE — Progress Notes (Signed)
PROGRESS NOTE    [name]  [id number] DOB: [birth year] DOA: 05/15/2021 PCP: Boyce Medici, FNP   Brief Narrative:  [name] is a 60 y.o. 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 Presented with ongoing nausea and vomiting. Was recently admitted  for persistent nausea vomiting and constipation discharged on 05/14/2021 with resolution of symptoms. During patient's last hospitalization he had a CT of the abdomen pelvis which was negative and a head CT without acute findings.  Patient had been taking Zofran at his facility but it was not helping. During his recent admission was noted to have coffee-ground emesis felt to be secondary to Mallory-Weiss tear GI consulted not recommended holding off on EGD.  Initially nausea and vomiting resolved although it has resumed once patient went back to nursing home facility.  Hemoglobin did remain stable and he was added on PPI.  Patient representing with nausea vomiting extremely poor p.o. intake from facility for reevaluation, and GI to be consulted for their expertise.  Assessment & Plan:   Active Problems:   Hyperlipidemia associated with type 2 diabetes mellitus (HCC)   Hypokalemia   Schizophrenia (HCC)   History of CVA with residual deficit   Chronic combined systolic (congestive) and diastolic (congestive) heart failure (HCC)   Benign essential HTN   Intractable nausea and vomiting   Intractable vomiting with nausea, unspecified vomiting type  Intractable nausea and vomiting secondary to dysphagia -Continue to advance diet per speech -continues to have very poor p.o. intake despite clearance -G-tube placement ordered via IR, patient agreeable -understands the risks and benefits of the procedure, sister agreeable at bedside 05/22/2021 -G-tube unable to be  placed for 5 days due to Plavix, will place core track in the meantime for definitive nutrition and medication route.  This can be removed once PEG tube is able to be utilized -Barium swallow remarkable for spillage and aspiration -continue to advance diet per speech, feeding tube placement for definitive route for medications nutrition and fluids given patient's limited intake.  History of multiple CVAs with residual right-sided weakness-okay to resume antiplatelet agents per GI, he is also now on a PPI  Essential hypertension-continue home medications  Hyperlipidemia-continue statin History of schizophrenia-continue home medications Chronic combined CHF-appears euvolemic, continue home medications  Type 2 diabetes mellitus-continue home medications Constipation-resolved with aggressive bowel regimen, continue on discharge  DVT prophylaxis: None given previous episodes of hematemesis coffee-ground emesis Code Status: Full Family Communication: Discussed with Katharine Look at bedside  Status is: Inpatient  Dispo: The patient is from: Facility              Anticipated d/c is to: Same              Anticipated d/c date is: 48 to 72 hours              Patient currently not medically stable for discharge  Consultants:  GI, [facility]  Procedures:  G-tube placement pending IR schedule  Antimicrobials:  Not currently indicated  Subjective: No acute issues or events overnight, patient continues to have complaints of weakness and fatigue, he is actually looking forward to NG tube placement today to initiate nutrition.  Objective: Vitals:   05/23/21 0423 05/23/21 0500 05/23/21 0800 05/23/21 0818  BP: (!) 134/94   (!) 155/133  Pulse: 90   86  Resp: 20   (!) 22  Temp:  99 F (37.2 C)   98.3 F (36.8 C)  TempSrc: Oral   Oral  SpO2: 100%  98% 100%  Weight:  80.1 kg    Height:        Intake/Output Summary (Last 24 hours) at 05/23/2021 1200 Last data filed at 05/23/2021 0836 Gross per 24 hour   Intake 160 ml  Output 275 ml  Net -115 ml    Filed Weights   05/15/21 1421 05/23/21 0500  Weight: 79.4 kg 80.1 kg    Examination:  General:  Pleasantly resting in bed, No acute distress. HEENT: Right-sided facial droop with oral spillage Neck:  Without mass or deformity.  Trachea is midline. Lungs: Coarse upper airway sounds without overt rales or wheeze. Heart:  Regular rate and rhythm.  Without murmurs, rubs, or gallops. Abdomen:  Soft, nontender, nondistended.  Without guarding or rebound. Extremities: Without cyanosis, clubbing, edema; right hemiparesis Vascular:  Dorsalis pedis and posterior tibial pulses palpable bilaterally. Skin:  Warm and dry, no erythema, no ulcerations.   Data Reviewed: I have personally reviewed following labs and imaging studies  CBC: Recent Labs  Lab 05/18/21 0151 05/19/21 0306 05/20/21 0338 05/21/21 0357 05/23/21 0631  WBC 7.1 9.6 10.4 9.2 8.0  HGB 11.6* 12.7* 12.6* 11.9* 12.3*  HCT 36.2* 38.6* 39.4 36.8* 38.4*  MCV 85.4 83.9 85.3 85.2 86.1  PLT 209 207 203 204 [id number]    Basic Metabolic Panel: Recent Labs  Lab 05/18/21 0151 05/19/21 0306 05/20/21 0338 05/21/21 0357 05/23/21 0631  NA 137 138 137 140 142  K 3.0* 3.3* 3.0* 3.0* 3.2*  CL 99 99 97* 101 101  CO2 '30 28 28 31 '$ 32  GLUCOSE 112* 135* 124* 114* 132*  BUN '11 10 13 19 19  '$ CREATININE 1.23 1.14 1.21 1.53* 1.17  CALCIUM 9.1 9.4 9.2 9.6 9.4    GFR: Estimated Creatinine Clearance: 73.3 mL/min (by C-G formula based on SCr of 1.17 mg/dL). Liver Function Tests: No results for input(s): AST, ALT, ALKPHOS, BILITOT, PROT, ALBUMIN in the last 168 hours.  No results for input(s): LIPASE, AMYLASE in the last 168 hours.  No results for input(s): AMMONIA in the last 168 hours. Coagulation Profile: No results for input(s): INR, PROTIME in the last 168 hours. Cardiac Enzymes: No results for input(s): CKTOTAL, CKMB, CKMBINDEX, TROPONINI in the last 168 hours.  BNP (last 3  results) No results for input(s): PROBNP in the last 8760 hours. HbA1C: No results for input(s): HGBA1C in the last 72 hours.  CBG: Recent Labs  Lab 05/22/21 1938 05/23/21 0026 05/23/21 0445 05/23/21 0822 05/23/21 1131  GLUCAP 143* 113* 119* 112* 135*    Lipid Profile: No results for input(s): CHOL, HDL, LDLCALC, TRIG, CHOLHDL, LDLDIRECT in the last 72 hours. Thyroid Function Tests: No results for input(s): TSH, T4TOTAL, FREET4, T3FREE, THYROIDAB in the last 72 hours.  Anemia Panel: No results for input(s): VITAMINB12, FOLATE, FERRITIN, TIBC, IRON, RETICCTPCT in the last 72 hours. Sepsis Labs: No results for input(s): PROCALCITON, LATICACIDVEN in the last 168 hours.   Recent Results (from the past 240 hour(s))  SARS CORONAVIRUS 2 (TAT 6-24 HRS) Nasopharyngeal Nasopharyngeal Swab     Status: None   Collection Time: 05/15/21 10:14 PM   Specimen: Nasopharyngeal Swab  Result Value Ref Range Status   SARS Coronavirus 2 NEGATIVE NEGATIVE Final    Comment: (NOTE) SARS-CoV-2 target nucleic acids are NOT DETECTED.  The SARS-CoV-2 RNA is generally detectable in upper and lower respiratory specimens during the acute phase  of infection. Negative results do not preclude SARS-CoV-2 infection, do not rule out co-infections with other pathogens, and should not be used as the sole basis for treatment or other patient management decisions. Negative results must be combined with clinical observations, patient history, and epidemiological information. The expected result is Negative.  Fact Sheet for Patients: SugarRoll.be  Fact Sheet for Healthcare Providers: https://www.woods-mathews.com/  This test is not yet approved or cleared by the [place] FDA and  has been authorized for detection and/or diagnosis of SARS-CoV-2 by FDA under an Emergency Use Authorization (EUA). This EUA will remain  in effect (meaning this test can be used) for  the duration of the COVID-19 declaration under Se ction 564(b)(1) of the Act, 21 U.S.C. section 360bbb-3(b)(1), unless the authorization is terminated or revoked sooner.  Performed at [facility], [place] [address] [place], [place] [address]   MRSA Next Gen by PCR, Nasal     Status: None   Collection Time: 05/16/21 11:24 AM   Specimen: Nasal Mucosa; Nasal Swab  Result Value Ref Range Status   MRSA by PCR Next Gen NOT DETECTED NOT DETECTED Final    Comment: (NOTE) The GeneXpert MRSA Assay (FDA approved for NASAL specimens only), is one component of a comprehensive MRSA colonization surveillance program. It is not intended to diagnose MRSA infection nor to guide or monitor treatment for MRSA infections. Test performance is not FDA approved in patients less than 9 years old. Performed at [facility], [place] [address] [place], [place] [address]        Radiology Studies: No results found.  Scheduled Meds:  amLODipine  10 mg Oral q morning   atorvastatin  40 mg Oral q1800   carvedilol  25 mg Oral BID WC   chlorthalidone  50 mg Oral Daily   feeding supplement  237 mL Oral TID BM   FLUoxetine  10 mg Oral Daily   insulin aspart  0-9 Units Subcutaneous Q4H   losartan  75 mg Oral Daily   metoCLOPramide (REGLAN) injection  10 mg Intravenous Q8H   multivitamin with minerals  1 tablet Oral Daily   paliperidone  6 mg Oral q morning   pantoprazole (PROTONIX) IV  40 mg Intravenous QHS   polyethylene glycol  17 g Oral BID   senna  2 tablet Oral QHS   Continuous Infusions:  feeding supplement (OSMOLITE 1.2 CAL)     promethazine (PHENERGAN) injection (IM or IVPB)       LOS: 7 days   Time spent: 35 min  Little Ishikawa, DO [facility]  If 7PM-7AM, please contact night-coverage [email or web address]  05/23/2021, 12:00 PM

## 2021-05-23 NOTE — Procedures (Signed)
Cortrak  Tube Type:  Cortrak - 43 inches Tube Location:  Right nare Initial Placement:  Stomach Secured by: Bridle Technique Used to Measure Tube Placement:  Marking at nare/corner of mouth Cortrak Secured At:  72 cm Cortrak Tube Team Note:  Consult received to place a Cortrak feeding tube.   X-ray is required, abdominal x-ray has been ordered by the Cortrak team. Please confirm tube placement before using the Cortrak tube.   If the tube becomes dislodged please keep the tube and contact the Cortrak team at [email or web address] (password [email or web address]) for replacement.  If after hours and replacement cannot be delayed, place a NG tube and confirm placement with an abdominal x-ray.    Koleen Distance MS, RD, LDN Please refer to [facility] for RD and/or RD on-call/weekend/after hours pager

## 2021-05-23 NOTE — Progress Notes (Signed)
Nutrition Follow-up  DOCUMENTATION CODES:   Not applicable  INTERVENTION:  Via Cortrak: -Provide Osmolite 1.5 @ 12m/hr, advance by 122mhr Q4H until goal rate of 6532mr is reached (1560m56m45ml61msource TF BID -200ml 15m water Q4H  At goal, TF will provide 2420 kcals, 120g protein, 1189ml f30mwater (2389ml to52mfree water with flushes) TF meets 100% calorie and protein needs at this time; however, plan to adjust TF regimen once N/V resolved to provide fewer kcals/protein in order to encourage po intake  -d/c Ensure Enlive  NUTRITION DIAGNOSIS:   Inadequate oral intake related to nausea, vomiting as evidenced by per patient/family report.  ongoing  GOAL:   Patient will meet greater than or equal to 90% of their needs  Will address with TF  MONITOR:   PO intake, Supplement acceptance, Weight trends, Labs, I & O's  REASON FOR ASSESSMENT:   Consult Enteral/tube feeding initiation and management  ASSESSMENT:   Pt with a PMH significant of  EtOH abuse, cocaine abuse, DM2, malignant HTN, schizophrenia, chronic combined CHF, multiple prior strokes with persistent dysarthria and residual R sided weakness admitted for intractable N/V and hypokalemia.  6/30 diet downgraded to dysphagia 1 with thin liquids 7/01 diet downgraded to dysphagia 1 with nectar thick  7/06 cortrak placement (tip gastric per xray)  Pt having Cortrak placed. Discussed pt with RN who reports pt was evaluated by IR for G-tube placement, but decision was made to wait for ~5 days to allow for Plavix washout. Pt to have TF provided by Cortrak in the meantime given continued inadequate po intake 2/2 N/V. TF protocol was initiated  today at 1407 (Osmolite 1.2 @ 20ml tit55m to goal of 50ml/hr).44m RN, pt tolerating well so far, but TF has yet to be advanced beyond 20ml/hr. W15madjust TF regimen to meet pt's needs, but will continue with plan for titration to goal TF rate.   PO Intake: 20-50% x last 8  recorded meals (30% average meal intake)  Pt does have orders for Ensure TID but given pt's need for nectar thick liquids and continued N/V, will discontinue this order at this time. Dysphagia 1 diet provides Magic Cup BID by default, so pt will continue to receive oral nutrition supplements.   UOP: 275ml + 1x u37msured occurrence x24 hours Stool: 1x unmeasured occurrence x24 hours  Medications:  [START ON 05/24/2021] amLODipine  10 mg Per Tube q morning   atorvastatin  40 mg Per Tube q1800   carvedilol  25 mg Per Tube BID WC   [START ON 05/24/2021] chlorthalidone  50 mg Per Tube Daily   feeding supplement  237 mL Oral TID BM   [START ON 05/24/2021] FLUoxetine  10 mg Per Tube Daily   free water  100 mL Per Tube Q6H   insulin aspart  0-9 Units Subcutaneous Q4H   [START ON 05/24/2021] losartan  75 mg Per Tube Daily   metoCLOPramide  10 mg Per Tube Q8H   [START ON 05/24/2021] multivitamin with minerals  1 tablet Per Tube Daily   paliperidone  6 mg Oral q morning   pantoprazole sodium  40 mg Per Tube Daily   polyethylene glycol  17 g Per Tube BID   senna  2 tablet Per Tube QHS  Continuous Infusions:  feeding supplement (OSMOLITE 1.2 CAL) 1,000 mL (05/23/21 1407)   Labs: Recent Labs  Lab 05/20/21 0338 05/21/21 0357 05/23/21 0631  NA 137 140 142  K 3.0* 3.0* 3.2*  CL 97* 101  101  CO2 28 31 32  BUN '13 19 19  '$ CREATININE 1.21 1.53* 1.17  CALCIUM 9.2 9.6 9.4  GLUCOSE 124* 114* 132*  CBGs [id number]  Diet Order:   Diet Order             DIET - DYS 1 Room service appropriate? No; Fluid consistency: Nectar Thick  Diet effective now                   EDUCATION NEEDS:   No education needs have been identified at this time  Skin:  Skin Assessment: Reviewed RN Assessment  Last BM:  7/6 type 6  Height:   Ht Readings from Last 1 Encounters:  05/15/21 '5\' 11"'$  (1.803 m)    Weight:   Wt Readings from Last 1 Encounters:  05/23/21 80.1 kg    BMI:  Body mass index is 24.63  kg/m.  Estimated Nutritional Needs:   Kcal:  2400-2600  Protein:  120-130g  Fluid:  >2L    Larkin Ina, MS, RD, LDN (she/her/hers) RD pager number and weekend/on-call pager number located in [place].

## 2021-05-23 NOTE — TOC Progression Note (Signed)
Transition of Care [facility]) - Progression Note    Patient Details  Name: [name] MRN: [id number] Date of Birth: [birth year]  [facility] CM/SW Contact  Joanne Chars, LCSW Phone Number: 05/23/2021, 3:43 PM  Clinical Narrative:    Narda Rutherford at [facility] called.  She cannot hold pt bed if pt daughter is not going to have him return.  She needs answer by tomorrow if daughter is going to have pt return or not.  CSW spoke to following facilities to request they review pt referral: [facility]: no medicaid beds [facility]: Lexine Baton reviewing [facility]: Left message [facility]: Shirlee Limerick reviewing [facility]-denied  CSW spoke with pt daughter Katharine Look, informed her of [facility] needing to know if pt is truly not going to return by tomorrow.  Gave updates on facilities. Discussed possibly reviewing to  facilities outside of [place].  She is willing to widen the search.  She will think about [facility] and we will talk tomorrow.      Expected Discharge Plan: [place] Barriers to Discharge: Continued Medical Work up, SNF Pending bed offer  Expected Discharge Plan and Services Expected Discharge Plan: [place] Choice: [facility] arrangements for the past 2 months: [place]                                       Social Determinants of Health (SDOH) Interventions    Readmission Risk Interventions No flowsheet data found.

## 2021-05-23 NOTE — Progress Notes (Addendum)
This chaplain responded to PMT consult for creating or updating the Pt. Advance Directive.    The chaplain completed HCPOA education with the Pt. The Pt. was able to confirm his understanding and choice of healthcare agent, [name], through a nod or a yes/no response.  This chaplain has requested a visit from the notary and witnesses this afternoon to complete the notarization of the Pt. AD:  HCPOA. The Pt. AD is in his chart.  This chaplain is available for F/U spiritual care as needed.  [id number].

## 2021-05-23 NOTE — Plan of Care (Signed)
  Problem: Clinical Measurements: Goal: Will remain free from infection Outcome: Progressing   Problem: Nutrition: Goal: Adequate nutrition will be maintained Outcome: Progressing   

## 2021-05-24 ENCOUNTER — Encounter (HOSPITAL_COMMUNITY): Payer: Self-pay | Admitting: Internal Medicine

## 2021-05-24 DIAGNOSIS — Z7189 Other specified counseling: Secondary | ICD-10-CM | POA: Diagnosis not present

## 2021-05-24 DIAGNOSIS — Z515 Encounter for palliative care: Secondary | ICD-10-CM | POA: Diagnosis not present

## 2021-05-24 LAB — GLUCOSE, CAPILLARY
Glucose-Capillary: 128 mg/dL — ABNORMAL HIGH (ref 70–99)
Glucose-Capillary: 137 mg/dL — ABNORMAL HIGH (ref 70–99)
Glucose-Capillary: 122 mg/dL — ABNORMAL HIGH (ref 70–99)
Glucose-Capillary: 117 mg/dL — ABNORMAL HIGH (ref 70–99)
Glucose-Capillary: 140 mg/dL — ABNORMAL HIGH (ref 70–99)

## 2021-05-24 MED ORDER — GLYCOPYRROLATE 1 MG PO TABS
1.0000 mg | ORAL_TABLET | Freq: Two times a day (BID) | ORAL | Status: DC
  Administered 2021-05-24 – 2021-05-27 (×6): 1 mg via ORAL
  Filled 2021-05-24 (×6): qty 1

## 2021-05-24 MED ORDER — CEFAZOLIN SODIUM-DEXTROSE 2-4 GM/100ML-% IV SOLN
2.0000 g | INTRAVENOUS | Status: AC
  Administered 2021-05-28: 2 g via INTRAVENOUS
  Filled 2021-05-24: qty 100

## 2021-05-24 NOTE — Progress Notes (Signed)
PROGRESS NOTE    [name]  [id number] DOB: 1961/11/04 DOA: 05/15/2021 PCP: Boyce Medici, FNP   Brief Narrative:  [name] is a 60 y.o. 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 Presented with ongoing nausea and vomiting. Was recently admitted  for persistent nausea vomiting and constipation discharged on 05/14/2021 with resolution of symptoms. During patient's last hospitalization he had a CT of the abdomen pelvis which was negative and a head CT without acute findings.  Patient had been taking Zofran at his facility but it was not helping. During his recent admission was noted to have coffee-ground emesis felt to be secondary to Mallory-Weiss tear GI consulted not recommended holding off on EGD.  Initially nausea and vomiting resolved although it has resumed once patient went back to nursing home facility.  Hemoglobin did remain stable and he was added on PPI.  Patient representing with nausea vomiting extremely poor p.o. intake from facility for reevaluation, and GI to be consulted for their expertise.  Assessment & Plan:   Active Problems:   Hyperlipidemia associated with type 2 diabetes mellitus (HCC)   Hypokalemia   Schizophrenia (HCC)   History of CVA with residual deficit   Chronic combined systolic (congestive) and diastolic (congestive) heart failure (HCC)   Benign essential HTN   Intractable nausea and vomiting   Intractable vomiting with nausea, unspecified vomiting type  Intractable nausea and vomiting secondary to dysphagia -Continue to advance diet per speech -continues to have very poor p.o. intake despite oral clearance per SLP -GI re-consulted for evaluation and possible endoscopy for further workup of stricture/stenosis -NPO since 7/6 afternoon given ongoing vomiting due to  tube feeds -G-tube placement ordered via IR 7/5 - need to wait 5 days due to plavix, patient agreeable -understands the risks and benefits of the procedure, sister agreeable at bedside 05/22/2021  History of multiple CVAs with residual right-sided weakness-holding Plavix per IR, he is also now on a PPI  Essential hypertension-continue home medications  Hyperlipidemia-continue statin History of schizophrenia-continue home medications Chronic combined CHF-appears euvolemic, continue home medications  Type 2 diabetes mellitus-continue home medications Constipation-resolved with bowel regimen, continue on discharge  DVT prophylaxis: None given previous episodes of hematemesis coffee-ground emesis Code Status: Full Family Communication: unavailable  Status is: Inpatient  Dispo: The patient is from: Facility              Anticipated d/c is to: Same              Anticipated d/c date is: 72+ hours              Patient currently not medically stable for discharge  Consultants:  GI, [facility]  Procedures:  G-tube placement pending IR schedule Possible endoscopy pending GI evaluation  Antimicrobials:  Not currently indicated  Subjective: Patient continues to have aspiration of any p.o. intake now as well as PEG tube feeds concerning for obstructive process, patient otherwise continues to complain of hunger and fatigue  Objective: Vitals:   05/23/21 2334 05/24/21 0309 05/24/21 0500 05/24/21 0741  BP: 128/89 (!) 161/120    Pulse: 88 98    Resp: 20 (!) 22    Temp: 98.4 F (36.9 C) 97.9 F (36.6 C)    TempSrc: Oral Oral    SpO2: 100% 90%  96%  Weight:   81.4 kg   Height:  Intake/Output Summary (Last 24 hours) at 05/24/2021 0848 Last data filed at 05/23/2021 1742 Gross per 24 hour  Intake 175 ml  Output --  Net 175 ml    Filed Weights   05/15/21 1421 05/23/21 0500 05/24/21 0500  Weight: 79.4 kg 80.1 kg 81.4 kg    Examination:  General:  Pleasantly resting in bed, No acute  distress. HEENT: Right-sided facial droop with oral spillage Neck:  Without mass or deformity.  Trachea is midline.,  Core track left nare Lungs: Coarse upper airway sounds without overt rales or wheeze. Heart:  Regular rate and rhythm.  Without murmurs, rubs, or gallops. Abdomen:  Soft, nontender, nondistended.  Without guarding or rebound. Extremities: Without cyanosis, clubbing, edema; right hemiparesis Vascular:  Dorsalis pedis and posterior tibial pulses palpable bilaterally. Skin:  Warm and dry, no erythema, no ulcerations.   Data Reviewed: I have personally reviewed following labs and imaging studies  CBC: Recent Labs  Lab 05/18/21 0151 05/19/21 0306 05/20/21 0338 05/21/21 0357 05/23/21 0631  WBC 7.1 9.6 10.4 9.2 8.0  HGB 11.6* 12.7* 12.6* 11.9* 12.3*  HCT 36.2* 38.6* 39.4 36.8* 38.4*  MCV 85.4 83.9 85.3 85.2 86.1  PLT 209 207 203 204 [id number]    Basic Metabolic Panel: Recent Labs  Lab 05/18/21 0151 05/19/21 0306 05/20/21 0338 05/21/21 0357 05/23/21 0631  NA 137 138 137 140 142  K 3.0* 3.3* 3.0* 3.0* 3.2*  CL 99 99 97* 101 101  CO2 '30 28 28 31 '$ 32  GLUCOSE 112* 135* 124* 114* 132*  BUN '11 10 13 19 19  '$ CREATININE 1.23 1.14 1.21 1.53* 1.17  CALCIUM 9.1 9.4 9.2 9.6 9.4    GFR: Estimated Creatinine Clearance: 73.3 mL/min (by C-G formula based on SCr of 1.17 mg/dL). Liver Function Tests: No results for input(s): AST, ALT, ALKPHOS, BILITOT, PROT, ALBUMIN in the last 168 hours.  No results for input(s): LIPASE, AMYLASE in the last 168 hours.  No results for input(s): AMMONIA in the last 168 hours. Coagulation Profile: No results for input(s): INR, PROTIME in the last 168 hours. Cardiac Enzymes: No results for input(s): CKTOTAL, CKMB, CKMBINDEX, TROPONINI in the last 168 hours.  BNP (last 3 results) No results for input(s): PROBNP in the last 8760 hours. HbA1C: No results for input(s): HGBA1C in the last 72 hours.  CBG: Recent Labs  Lab 05/23/21 1625  05/23/21 2136 05/23/21 2331 05/24/21 0305 05/24/21 0808  GLUCAP 114* 137* 140* 128* 137*    Lipid Profile: No results for input(s): CHOL, HDL, LDLCALC, TRIG, CHOLHDL, LDLDIRECT in the last 72 hours. Thyroid Function Tests: No results for input(s): TSH, T4TOTAL, FREET4, T3FREE, THYROIDAB in the last 72 hours.  Anemia Panel: No results for input(s): VITAMINB12, FOLATE, FERRITIN, TIBC, IRON, RETICCTPCT in the last 72 hours. Sepsis Labs: No results for input(s): PROCALCITON, LATICACIDVEN in the last 168 hours.   Recent Results (from the past 240 hour(s))  SARS CORONAVIRUS 2 (TAT 6-24 HRS) Nasopharyngeal Nasopharyngeal Swab     Status: None   Collection Time: 05/15/21 10:14 PM   Specimen: Nasopharyngeal Swab  Result Value Ref Range Status   SARS Coronavirus 2 NEGATIVE NEGATIVE Final    Comment: (NOTE) SARS-CoV-2 target nucleic acids are NOT DETECTED.  The SARS-CoV-2 RNA is generally detectable in upper and lower respiratory specimens during the acute phase of infection. Negative results do not preclude SARS-CoV-2 infection, do not rule out co-infections with other pathogens, and should not be used as the sole basis for treatment or other  patient management decisions. Negative results must be combined with clinical observations, patient history, and epidemiological information. The expected result is Negative.  Fact Sheet for Patients: SugarRoll.be  Fact Sheet for Healthcare Providers: https://www.woods-mathews.com/  This test is not yet approved or cleared by the [place] FDA and  has been authorized for detection and/or diagnosis of SARS-CoV-2 by FDA under an Emergency Use Authorization (EUA). This EUA will remain  in effect (meaning this test can be used) for the duration of the COVID-19 declaration under Se ction 564(b)(1) of the Act, 21 U.S.C. section 360bbb-3(b)(1), unless the authorization is terminated or revoked  sooner.  Performed at [facility], [place] [address], [place], [place] [address]   MRSA Next Gen by PCR, Nasal     Status: None   Collection Time: 05/16/21 11:24 AM   Specimen: Nasal Mucosa; Nasal Swab  Result Value Ref Range Status   MRSA by PCR Next Gen NOT DETECTED NOT DETECTED Final    Comment: (NOTE) The GeneXpert MRSA Assay (FDA approved for NASAL specimens only), is one component of a comprehensive MRSA colonization surveillance program. It is not intended to diagnose MRSA infection nor to guide or monitor treatment for MRSA infections. Test performance is not FDA approved in patients less than 69 years old. Performed at [facility], [place] [address], [place], [place] [address]        Radiology Studies: DG Abd Portable 1V  Result Date: 05/23/2021 CLINICAL DATA:  Evaluate feeding tube placement EXAM: PORTABLE ABDOMEN - 1 VIEW COMPARISON:  05/15/21 FINDINGS: The feeding tube tip is well below the level of the GE junction projecting over the expected location of the gastric antrum. Heart size and mediastinal contours appear stable. Lungs are clear. Enteric contrast material identified within the colon at the level of the hepatic flexure. IMPRESSION: Feeding tube tip projects over the gastric antrum. Electronically Signed   By: Kerby Moors M.D.   On: 05/23/2021 12:27    Scheduled Meds:  amLODipine  10 mg Per Tube q morning   atorvastatin  40 mg Per Tube q1800   carvedilol  25 mg Per Tube BID WC   chlorthalidone  50 mg Per Tube Daily   feeding supplement (PROSource TF)  45 mL Per Tube BID   FLUoxetine  10 mg Per Tube Daily   free water  200 mL Per Tube Q4H   insulin aspart  0-9 Units Subcutaneous Q4H   losartan  75 mg Per Tube Daily   metoCLOPramide  10 mg Per Tube Q8H   multivitamin with minerals  1 tablet Per Tube Daily   paliperidone  6 mg Oral q morning   pantoprazole sodium  40 mg Per Tube Daily   polyethylene glycol  17 g Per Tube BID   senna  2 tablet Per  Tube QHS   Continuous Infusions:  feeding supplement (OSMOLITE 1.5 CAL) 1,000 mL (05/23/21 1734)     LOS: 8 days   Time spent: 35 min  Little Ishikawa, DO [facility]  If 7PM-7AM, please contact night-coverage [email or web address]  05/24/2021, 8:48 AM

## 2021-05-24 NOTE — Progress Notes (Signed)
Patient rested comfortably throughout the shift. No acute distress noted. Patient's tube feeding has been on HOLD throughout the shift per MD order.  Patient tolerating 50 ml of water with meds through feeding tube well. Patient resting with his eyes closed at this time. Bed kept in low position and locked. Call bell in reach.

## 2021-05-24 NOTE — Consult Note (Signed)
Reason for Consult: Nausea vomiting inability to eat Referring Physician: Hospital team  [name] is an 60 y.o. male.  HPI: Patient seen and examined and hospital computer chart reviewed and case discussed with the hospital team and my partner Dr. Paulita Fujita and patient has had nausea vomiting spitting up and coughing not only while trying to eat and during his speech swallowing study as well as with his tube feeds and previous work-up has been nonrevealing and early in his hospital stay he did have some self-limited hematemesis but he denies any other complaints and has his feeding tube in but they are not feeding him currently and I did give him some water which he requested and he actually swallowed quite a bit with a straw but then at the end began coughing which was short-lived and did spit out a little water only  Past Medical History:  Diagnosis Date   Alcohol abuse    Arthritis    Crack cocaine use    Depression    Diabetes mellitus    Drug abuse ([place])    DTs (delirium tremens) ([place])    history of    Gout    Noncompliance with medication regimen    Schizophrenia ([place])    Stroke ([place])    Systolic heart failure secondary to hypertension ([place])    Uncontrolled hypertension     Past Surgical History:  Procedure Laterality Date   LEFT HEART CATH AND CORONARY ANGIOGRAPHY N/A 02/03/2018   Procedure: LEFT HEART CATH AND CORONARY ANGIOGRAPHY;  Surgeon: Jettie Booze, MD;  Location: [facility];  Service: Cardiovascular;  Laterality: N/A;   MANDIBLE RECONSTRUCTION     TONSILLECTOMY      Family History  Problem Relation Age of Onset   Hypertension Other     Social History:  reports that he has been smoking cigarettes. He has been smoking an average of 0.25 packs per day. He has never used smokeless tobacco. He reports previous alcohol use of about 1.0 - 2.0 standard drink of alcohol per week. He reports previous drug use. Drugs: "Crack" cocaine and  Cocaine.  Allergies: No Known Allergies  Medications: I have reviewed the patient's current medications.  Results for orders placed or performed during the hospital encounter of 05/15/21 (from the past 48 hour(s))  Glucose, capillary     Status: Abnormal   Collection Time: 05/22/21  4:24 PM  Result Value Ref Range   Glucose-Capillary 112 (H) 70 - 99 mg/dL    Comment: Glucose reference range applies only to samples taken after fasting for at least 8 hours.  Glucose, capillary     Status: Abnormal   Collection Time: 05/22/21  7:38 PM  Result Value Ref Range   Glucose-Capillary 143 (H) 70 - 99 mg/dL    Comment: Glucose reference range applies only to samples taken after fasting for at least 8 hours.  Glucose, capillary     Status: Abnormal   Collection Time: 05/23/21 12:26 AM  Result Value Ref Range   Glucose-Capillary 113 (H) 70 - 99 mg/dL    Comment: Glucose reference range applies only to samples taken after fasting for at least 8 hours.  Glucose, capillary     Status: Abnormal   Collection Time: 05/23/21  4:45 AM  Result Value Ref Range   Glucose-Capillary 119 (H) 70 - 99 mg/dL    Comment: Glucose reference range applies only to samples taken after fasting for at least 8 hours.  CBC  Status: Abnormal   Collection Time: 05/23/21  6:31 AM  Result Value Ref Range   WBC 8.0 4.0 - 10.5 K/uL   RBC 4.46 4.22 - 5.81 MIL/uL   Hemoglobin 12.3 (L) 13.0 - 17.0 g/dL   HCT 38.4 (L) 39.0 - 52.0 %   MCV 86.1 80.0 - 100.0 fL   MCH 27.6 26.0 - 34.0 pg   MCHC 32.0 30.0 - 36.0 g/dL   RDW 13.5 11.5 - 15.5 %   Platelets 219 150 - 400 K/uL   nRBC 0.0 0.0 - 0.2 %    Comment: Performed at [facility], [place] [address], [place], [place] [id number]  Basic metabolic panel     Status: Abnormal   Collection Time: 05/23/21  6:31 AM  Result Value Ref Range   Sodium 142 135 - 145 mmol/L   Potassium 3.2 (L) 3.5 - 5.1 mmol/L   Chloride 101 98 - 111 mmol/L   CO2 32 22 - 32 mmol/L   Glucose, Bld  132 (H) 70 - 99 mg/dL    Comment: Glucose reference range applies only to samples taken after fasting for at least 8 hours.   BUN 19 6 - 20 mg/dL   Creatinine, Ser 1.17 0.61 - 1.24 mg/dL   Calcium 9.4 8.9 - 10.3 mg/dL   GFR, Estimated >60 >60 mL/min    Comment: (NOTE) Calculated using the CKD-EPI Creatinine Equation (2021)    Anion gap 9 5 - 15    Comment: Performed at [facility] [address], [place], [place] [address]  Glucose, capillary     Status: Abnormal   Collection Time: 05/23/21  8:22 AM  Result Value Ref Range   Glucose-Capillary 112 (H) 70 - 99 mg/dL    Comment: Glucose reference range applies only to samples taken after fasting for at least 8 hours.  Glucose, capillary     Status: Abnormal   Collection Time: 05/23/21 11:31 AM  Result Value Ref Range   Glucose-Capillary 135 (H) 70 - 99 mg/dL    Comment: Glucose reference range applies only to samples taken after fasting for at least 8 hours.  Glucose, capillary     Status: Abnormal   Collection Time: 05/23/21  4:25 PM  Result Value Ref Range   Glucose-Capillary 114 (H) 70 - 99 mg/dL    Comment: Glucose reference range applies only to samples taken after fasting for at least 8 hours.  Glucose, capillary     Status: Abnormal   Collection Time: 05/23/21  9:36 PM  Result Value Ref Range   Glucose-Capillary 137 (H) 70 - 99 mg/dL    Comment: Glucose reference range applies only to samples taken after fasting for at least 8 hours.  Glucose, capillary     Status: Abnormal   Collection Time: 05/23/21 11:31 PM  Result Value Ref Range   Glucose-Capillary 140 (H) 70 - 99 mg/dL    Comment: Glucose reference range applies only to samples taken after fasting for at least 8 hours.  Glucose, capillary     Status: Abnormal   Collection Time: 05/24/21  3:05 AM  Result Value Ref Range   Glucose-Capillary 128 (H) 70 - 99 mg/dL    Comment: Glucose reference range applies only to samples taken after fasting for at least 8  hours.  Glucose, capillary     Status: Abnormal   Collection Time: 05/24/21  8:08 AM  Result Value Ref Range   Glucose-Capillary 137 (H) 70 - 99 mg/dL  Comment: Glucose reference range applies only to samples taken after fasting for at least 8 hours.  Glucose, capillary     Status: Abnormal   Collection Time: 05/24/21 11:42 AM  Result Value Ref Range   Glucose-Capillary 122 (H) 70 - 99 mg/dL    Comment: Glucose reference range applies only to samples taken after fasting for at least 8 hours.    DG Abd Portable 1V  Result Date: 05/23/2021 CLINICAL DATA:  Evaluate feeding tube placement EXAM: PORTABLE ABDOMEN - 1 VIEW COMPARISON:  05/15/21 FINDINGS: The feeding tube tip is well below the level of the GE junction projecting over the expected location of the gastric antrum. Heart size and mediastinal contours appear stable. Lungs are clear. Enteric contrast material identified within the colon at the level of the hepatic flexure. IMPRESSION: Feeding tube tip projects over the gastric antrum. Electronically Signed   By: Kerby Moors M.D.   On: 05/23/2021 12:27    Review of Systems negative except above Blood pressure (!) 147/111, pulse 82, temperature 98.2 F (36.8 C), temperature source Oral, resp. rate 20, height '5\' 11"'$  (1.803 m), weight 81.4 kg, SpO2 90 %. Physical Exam vital signs stable afebrile lying comfortably in the bed abdomen is soft nontender can move his left hand and arm x-rays and labs reviewed  Assessment/Plan: Nausea vomiting problems swallowing questionable etiology recent CVA and patient with multiple medical problems Plan: I discussed endoscopy with him and he seems to understand and will proceed tomorrow early afternoon with further work-up and plans pending those findings  Brandon E 05/24/2021, 4:22 PM

## 2021-05-24 NOTE — TOC Progression Note (Addendum)
Transition of Care [facility]) - Progression Note    Patient Details  Name: [name] MRN: [id number] Date of Birth: 1961/05/14  Transition of Care [facility]) CM/SW Contact  Joanne Chars, LCSW Phone Number: 05/24/2021, 10:30 AM  Clinical Narrative:    [facility] declines to make bed offer. [facility] will review pt.   [place], [place], [place] all declined pt.    1500: CSW spoke with [place] at [facility].  She has LM for pt daughter, will need corporate approval for a medicaid pt and she will see what she can do.  CSW twice attempted to speak with daughter this afternoon.  Janie at [facility] reports she cannot hold bed for pt with daughter stating she is not going to have him return.      Expected Discharge Plan: [place] Barriers to Discharge: Continued Medical Work up, SNF Pending bed offer  Expected Discharge Plan and Services Expected Discharge Plan: [place] Choice: [place] arrangements for the past 2 months: [place]                                       Social Determinants of Health (SDOH) Interventions    Readmission Risk Interventions No flowsheet data found.

## 2021-05-24 NOTE — Plan of Care (Signed)
  Problem: Education: Goal: Knowledge of General Education information will improve Description Including pain rating scale, medication(s)/side effects and non-pharmacologic comfort measures Outcome: Progressing   Problem: Health Behavior/Discharge Planning: Goal: Ability to manage health-related needs will improve Outcome: Progressing   

## 2021-05-24 NOTE — Progress Notes (Signed)
  Speech Language Pathology Treatment: Dysphagia  Patient Details Name: [name] MRN: [id number] DOB: 04-08-1961 Today's Date: 05/24/2021 Time: [id number] SLP Time Calculation (min) (ACUTE ONLY): 22 min  Assessment / Plan / Recommendation Clinical Impression  Pt was seen for dysphagia treatment. He was alert and cooperative during the session. [name] RN reported that the pt continues to have episodes of emesis and significant coughing with both TF and oral intake. Pt was seen with breakfast tray. He tolerated the initial 1-2 boluses of puree solids without overt s/sx of aspiration, but exhibited significant coughing after the third bolus. Pt requested liquid boluses thereafter and demonstrated significant coughing and oral suctioning of yellow-colored liquid (likely orange juice) after intake of three boluses of nectar thick liquids. [name] clinical presentation does not align with the results of the modified barium swallow study on 7/1 during which no instances of laryngeal invasion (i.e., penetration/aspiration) were noted with purees. Additionally, he is now more symptomatic than during all prior sessions. Based on pt's clinical presentation, an NPO status is recommended at this time with allowance of nectar thick liquids if pt desires. Considering pt's continued episodes of emesis which would be unrelated to his oropharyngeal swallow function, and delayed coughing, SLP questions esophageal involvement. [name] presentation, and SLP's concerns regarding esophageal involvement were relayed to Dr. Avon Gully and Dr. Paulita Fujita. SLP will continue to follow pt.   HPI HPI: Pt is a 60 y.o. male with ongoing nausea and vomiting after recent discharge on 05/14/21. During recent admission pt noted to have coffee-ground emesis felt to be secondary to Mallory-Weiss tear GI consulted but recommended holding off on EGD. Palliative Care: full scope, full code. CT head: Age advanced chronic small vessel ischemia with  multiple chronic  infarcts and an interval left cerebellar infarct since 2021. 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 MBS 04/19/20:  Oropharyngeal dysphagia with left labial weakness, and a pharyngeal delay; regular texture diet and thin liquids recommended. Cortrak placed 7/6      SLP Plan  Continue with current plan of care       Recommendations  Diet recommendations: NPO Medication Administration: Via alternative means                Oral Care Recommendations: Oral care BID Follow up Recommendations:  (TBD) SLP Visit Diagnosis: Dysphagia, oropharyngeal phase (R13.12) Plan: Continue with current plan of care       Daemian Gahm I. Hardin Negus, [place], [place] Office number [phone] Pager [place] 05/24/2021, 10:09 AM

## 2021-05-24 NOTE — H&P (Addendum)
Chief Complaint: Patient was seen in consultation today for image guided gastrostomy tube placement Chief Complaint  Patient presents with   Nausea   Emesis   at the request of Dr. Avon Gully   Referring Physician(s): Dr. Avon Gully  Supervising Physician: Corrie Mckusick  Patient Status: [facility] - In-pt  History of Present Illness: [name] is a 60 y.o. male with past medical history of systolic heart failure secondary to hypertension, DM type II, alcohol abuse, drug abuse, depression, multiple prior strokes with persistent dysarthria residual right-sided weakness who is currently admitted due to persistent nausea and vomiting.  Patient was evaluated and cleared by SLP, however, continues to have very poor p.o. intake due to persistent nausea and vomiting.  IR was requested for image guided gastrostomy tube placement.  Patient laying in bed, not in acute distress but appears to be chronically ill.  Reports persistent nausea and vomiting, patient constantly coughed during assessment. Denise headache, fever, chills, shortness of breath, chest pain, abdominal pain,  and bleeding.   Past Medical History:  Diagnosis Date   Alcohol abuse    Arthritis    Crack cocaine use    Depression    Diabetes mellitus    Drug abuse ([place])    DTs (delirium tremens) ([place])    history of    Gout    Noncompliance with medication regimen    Schizophrenia ([place])    Stroke ([place])    Systolic heart failure secondary to hypertension ([place])    Uncontrolled hypertension     Past Surgical History:  Procedure Laterality Date   LEFT HEART CATH AND CORONARY ANGIOGRAPHY N/A 02/03/2018   Procedure: LEFT HEART CATH AND CORONARY ANGIOGRAPHY;  Surgeon: Jettie Booze, MD;  Location: [place] [facility];  Service: Cardiovascular;  Laterality: N/A;   MANDIBLE RECONSTRUCTION     TONSILLECTOMY      Allergies: Patient has no known allergies.  Medications: Prior to Admission medications    Medication Sig Start Date End Date Taking? Authorizing Provider  amLODipine (NORVASC) 10 MG tablet Take 1 tablet (10 mg total) by mouth daily. Patient taking differently: Take 10 mg by mouth every morning. 05/16/20  Yes Angiulli, Lavon Paganini, PA-C  aspirin EC 81 MG tablet Take 81 mg by mouth every morning. Swallow whole.   Yes [provider]  atorvastatin (LIPITOR) 40 MG tablet Take 1 tablet (40 mg total) by mouth daily. Patient taking differently: Take 40 mg by mouth daily at 6 PM. 05/15/20  Yes Angiulli, Lavon Paganini, PA-C  bacitracin ointment Apply 1 application topically 2 (two) times daily. 05/12/21  Yes Couture, Cortni S, PA-C  bisacodyl (DULCOLAX) 10 MG suppository Place 10 mg rectally daily as needed for mild constipation.   Yes [provider]  carvedilol (COREG) 25 MG tablet Take 1 tablet (25 mg total) by mouth 2 (two) times daily with a meal. Patient taking differently: Take 25 mg by mouth 2 (two) times daily. 05/15/20  Yes Angiulli, Lavon Paganini, PA-C  chlorthalidone (HYGROTON) 50 MG tablet Take 50 mg by mouth every morning.   Yes [provider]  clopidogrel (PLAVIX) 75 MG tablet Take 1 tablet (75 mg total) by mouth daily. Patient taking differently: Take 75 mg by mouth every morning. 05/15/20  Yes Angiulli, Lavon Paganini, PA-C  docusate sodium (COLACE) 100 MG capsule Take 100 mg by mouth 2 (two) times daily.   Yes [provider]  FLUoxetine (PROZAC) 10 MG capsule Take 1 capsule (10 mg total) by mouth daily.  Patient taking differently: Take 10 mg by mouth every morning. 05/15/20  Yes Angiulli, Lavon Paganini, PA-C  losartan (COZAAR) 25 MG tablet Take 75 mg by mouth every morning.   Yes [provider]  metFORMIN (GLUCOPHAGE) 500 MG tablet Take 1 tablet (500 mg total) by mouth 2 (two) times daily with a meal. 05/15/20  Yes Angiulli, Lavon Paganini, PA-C  ondansetron (ZOFRAN-ODT) 4 MG disintegrating tablet Take 4 mg by mouth every 6 (six) hours.   Yes [provider]  paliperidone (INVEGA) 6 MG 24 hr tablet Take 6 mg by mouth every morning.   Yes [provider]  pantoprazole (PROTONIX) 40 MG tablet Take 1 tablet (40 mg total) by mouth 2 (two) times daily before a meal. 05/14/21 05/14/22 Yes Gherghe, Vella Redhead, MD  polyethylene glycol (MIRALAX / GLYCOLAX) 17 g packet Take 17 g by mouth 2 (two) times daily. Mix in 8 oz liquid and drink   Yes [provider]  senna (SENOKOT) 8.6 MG TABS tablet Take 2 tablets by mouth at bedtime.   Yes [provider]  bisacodyl (DULCOLAX) 10 MG suppository Place 10 mg rectally See admin instructions. Qd x 5 days Patient not taking: No sig reported    [provider]  Dextrose-Sodium Chloride (DEXTROSE 5 % AND 0.9% NACL) 5-0.9 % Inject into the vein See admin instructions. Administer IV fluids PIV at 75 ml/hr for 1 liter,then stop Patient not taking: No sig reported    [provider]  ondansetron (ZOFRAN) 4 MG tablet Take 4 mg by mouth every 6 (six) hours as needed for nausea or vomiting. Patient not taking: No sig reported    [provider]  ondansetron (ZOFRAN-ODT) 4 MG disintegrating tablet Take 4 mg by mouth See admin instructions. Every 6 hours x 3 days for nausea Patient not taking: No sig reported    [provider]  ondansetron (ZOFRAN-ODT) 4 MG disintegrating tablet Take 4 mg by mouth See admin instructions. Every 6 hours x 3 days for nausea Patient not taking: No sig reported    [provider]     Family History  Problem Relation Age of Onset   Hypertension Other     Social History   Socioeconomic History   Marital status: Legally Separated    Spouse name: Not on file   Number of children: Not on file   Years of education: Not on file   Highest education level: Not on file  Occupational History   Not on file  Tobacco Use   Smoking status: Every Day    Packs/day: 0.25    Pack years: 0.00    Types: Cigarettes   Smokeless tobacco:  Never  Vaping Use   Vaping Use: Former  Substance and Sexual Activity   Alcohol use: Not Currently    Alcohol/week: 1.0 - 2.0 standard drink    Types: 1 - 2 Cans of beer per week    Comment: 2 cases of beer a day + liquour   Drug use: Not Currently    Types: "Crack" cocaine, Cocaine   Sexual activity: Not on file  Other Topics Concern   Not on file  Social History Narrative   Not on file   Social Determinants of Health   Financial Resource Strain: Not on file  Food Insecurity: Not on file  Transportation Needs: Not on file  Physical Activity: Not on file  Stress: Not on file  Social Connections: Not on file  Review of Systems: A 12 point ROS discussed and pertinent positives are indicated in the HPI above.  All other systems are negative.   Vital Signs: BP (!) 147/111   Pulse 82   Temp 98.2 F (36.8 C) (Oral)   Resp 20   Ht '5\' 11"'$  (1.803 m)   Wt 179 lb 7.3 oz (81.4 kg)   SpO2 90%   BMI 25.03 kg/m   Physical Exam Vitals reviewed.  Constitutional:      General: He is not in acute distress.    Appearance: He is ill-appearing.  HENT:     Head: Normocephalic and atraumatic.     Nose:     Comments: Positive for feeding tube  Cardiovascular:     Rate and Rhythm: Normal rate and regular rhythm.     Pulses: Normal pulses.     Heart sounds: Normal heart sounds.  Pulmonary:     Effort: Pulmonary effort is normal.     Breath sounds: Normal breath sounds.  Abdominal:     General: Abdomen is flat. Bowel sounds are normal.     Palpations: Abdomen is soft.  Skin:    General: Skin is warm.  Neurological:     Mental Status: He is alert and oriented to person, place, and time.  Psychiatric:        Mood and Affect: Mood normal.        Behavior: Behavior normal.        Judgment: Judgment normal.    MD Evaluation Airway: WNL Heart: WNL Abdomen: WNL Chest/ Lungs: WNL ASA  Classification: 2 Mallampati/Airway Score: Two  Imaging: DG Chest 2 View  Result  Date: 05/12/2021 CLINICAL DATA:  Vomiting.  Rule out aspiration. EXAM: CHEST - 2 VIEW COMPARISON:  Chest x-ray 05/09/2020 CT abdomen pelvis 05/12/2021, chest x-ray 05/09/2020 FINDINGS: The heart size and mediastinal contours are within normal limits. No focal consolidation. No pulmonary edema. No pleural effusion. No pneumothorax. No acute osseous abnormality. Degenerative changes of the right shoulder with widening of the acromioclavicular joint. Query trace hiatal hernia. IMPRESSION: 1. No active cardiopulmonary disease. 2. Query trace hiatal hernia. Electronically Signed   By: Iven Finn M.D.   On: 05/12/2021 20:50   CT Head Wo Contrast  Result Date: 05/12/2021 CLINICAL DATA:  Headache.  History of strokes. EXAM: CT HEAD WITHOUT CONTRAST TECHNIQUE: Contiguous axial images were obtained from the base of the skull through the vertex without intravenous contrast. COMPARISON:  05/12/2020 FINDINGS: Brain: There is no evidence of an acute infarct, intracranial hemorrhage, mass, midline shift, or extra-axial fluid collection. Patchy hypodensities in the cerebral white matter bilaterally have not significantly changed and are nonspecific but compatible with moderately age advanced chronic small vessel ischemic disease. A chronic lacunar infarct is again noted in the left basal ganglia. There is chronic dystrophic calcification medially in the left cerebellar hemisphere. New hypoattenuation lateral to the calcification in the left cerebellar hemisphere extending into the middle cerebellar peduncle is compatible with encephalomalacia/an interval chronic infarct. A small focus of cortical encephalomalacia laterally in the left temporal lobe is unchanged. There is mild cerebral and cerebellar atrophy. Vascular: Calcified atherosclerosis at the skull base. Skull: No fracture or suspicious osseous lesion. Sinuses/Orbits: Visualized paranasal sinuses and mastoid air cells are clear. Unremarkable orbits. Other: None.  IMPRESSION: 1. No evidence of acute intracranial abnormality. 2. Age advanced chronic small vessel ischemia with multiple chronic infarcts as above including an interval left cerebellar infarct. Electronically Signed   By: Logan Bores  M.D.   On: 05/12/2021 20:13   CT ABDOMEN PELVIS W CONTRAST  Result Date: 05/12/2021 CLINICAL DATA:  Constipation for 1 week. Concern for bowel obstruction. EXAM: CT ABDOMEN AND PELVIS WITH CONTRAST TECHNIQUE: Multidetector CT imaging of the abdomen and pelvis was performed using the standard protocol following bolus administration of intravenous contrast. CONTRAST:  143m OMNIPAQUE IOHEXOL 300 MG/ML  SOLN COMPARISON:  None. FINDINGS: Lower chest: No acute findings. Hepatobiliary: No focal liver abnormality is seen. No gallstones, gallbladder wall thickening, or biliary dilatation. Pancreas: Unremarkable. No pancreatic ductal dilatation or surrounding inflammatory changes. Spleen: Normal in size without focal abnormality. Adrenals/Urinary Tract: No adrenal masses. Kidneys normal in size, orientation and position with symmetric enhancement and excretion. No renal mass, stone or hydronephrosis. Normal ureters. Normal bladder. Stomach/Bowel: Normal stomach. Small bowel and colon are normal in caliber. No wall thickening. No inflammation. Mild to moderate increase in the colonic stool burden. Normal appendix visualized. Vascular/Lymphatic: Mild aortic atherosclerosis. No aneurysm. No enlarged lymph nodes. Reproductive: Unremarkable. Other: No abdominal wall hernia or abnormality. No abdominopelvic ascites. Musculoskeletal: No acute or significant osseous findings. IMPRESSION: 1. No acute findings. No evidence of bowel obstruction or inflammation. 2. Mild to moderate increase in the colonic stool burden. 3. Mild aortic atherosclerosis. Electronically Signed   By: DLajean ManesM.D.   On: 05/12/2021 16:05   DG Abdomen Acute W/Chest  Result Date: 05/15/2021 CLINICAL DATA:  Nausea and  vomiting for 2 days. EXAM: DG ABDOMEN ACUTE WITH 1 VIEW CHEST COMPARISON:  None. FINDINGS: There is no evidence of dilated bowel loops or free intraperitoneal air. A moderate amount of stool is seen within the descending colon. No radiopaque calculi or other significant radiographic abnormality is seen. The cardiac silhouette is mildly enlarged and unchanged in size. Both lungs are clear. A chronic deformity is seen involving the distal right clavicle. IMPRESSION: Negative abdominal radiographs.  No acute cardiopulmonary disease. Electronically Signed   By: TVirgina NorfolkM.D.   On: 05/15/2021 16:37   DG Abd Portable 1V  Result Date: 05/23/2021 CLINICAL DATA:  Evaluate feeding tube placement EXAM: PORTABLE ABDOMEN - 1 VIEW COMPARISON:  05/15/21 FINDINGS: The feeding tube tip is well below the level of the GE junction projecting over the expected location of the gastric antrum. Heart size and mediastinal contours appear stable. Lungs are clear. Enteric contrast material identified within the colon at the level of the hepatic flexure. IMPRESSION: Feeding tube tip projects over the gastric antrum. Electronically Signed   By: TKerby MoorsM.D.   On: 05/23/2021 12:27   DG Swallowing Func-Speech Pathology  Result Date: 05/18/2021 Formatting of this result is different from the original. Objective Swallowing Evaluation: Type of Study: MBS-Modified Barium Swallow Study  Patient Details Name: CRoyal KwiecienMRN: [id number] of Birth: [birth year]Today's Date: 05/18/2021 Time: SLP Start Time (ACUTE ONLY): [id number] Stop Time (ACUTE ONLY): 1208 SLP Time Calculation (min) (ACUTE ONLY): 23 min Past Medical History: Past Medical History: Diagnosis Date  Alcohol abuse   Arthritis   Crack cocaine use   Depression   Diabetes mellitus   Drug abuse ([place]   DTs (delirium tremens) ([place]   history of   Gout   Noncompliance with medication regimen   Schizophrenia ([place]   Stroke ([place]   Systolic heart failure secondary to  hypertension ([place]   Uncontrolled hypertension  Past Surgical History: Past Surgical History: Procedure Laterality Date  LEFT HEART CATH AND CORONARY ANGIOGRAPHY N/A 02/03/2018  Procedure: LEFT HEART CATH AND  CORONARY ANGIOGRAPHY;  Surgeon: Jettie Booze, MD;  Location: [place] [facility];  Service: Cardiovascular;  Laterality: N/A;  MANDIBLE RECONSTRUCTION    TONSILLECTOMY   HPI: Pt is a 60 y.o. male with ongoing nausea and vomiting after recent discharge on 05/14/21. During recent admission pt noted to have coffee-ground emesis felt to be secondary to Mallory-Weiss tear GI consulted but recommended holding off on EGD. Palliative Care: full scope, full code. 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 MBS 04/19/20:  Oropharyngeal dysphagia with left labial weakness, and a pharyngeal delay; regular texture diet and thin liquids recommended  No data recorded Assessment / Plan / Recommendation CHL IP CLINICAL IMPRESSIONS 05/18/2021 Clinical Impression Pt presents with oropharyngeal dysphagia characterize by reduced labial seal, reduced bolus cohesion, reduced posterior bolus propulsion, weak bolus manipulation, prolonged mastication, and a pharyngeal delay. He demonstrated anterior spillage to the right with all liquids, reduced labial stripping, residue in the right alteral sulcus, premature spillage to the valleculae and pyriform sinuses, penetration (PAS 5) and aspiration (PAS 8) with thin liquids. The initial bolus of thin liquids via cup was noted to prematurely spill to the pyriform sinuses and into the larynx. Subsequent instances of aspiration with thin liquids via straw were trace and of penetrated material after deglutition. Coughing was inconsistently noted following aspiration of thin liquids, but was ineffective. A dysphagia 1 diet with nectar thick liquids is recommended  at this time and SLP will follow for treatment. SLP Visit Diagnosis Dysphagia, oropharyngeal phase (R13.12) Attention and concentration deficit following -- Frontal lobe and executive function deficit following -- Impact on safety and function Mild aspiration risk;Moderate aspiration risk   CHL IP TREATMENT RECOMMENDATION 05/18/2021 Treatment Recommendations Therapy as outlined in treatment plan below   Prognosis 05/18/2021 Prognosis for Safe Diet Advancement Good Barriers to Reach Goals Cognitive deficits Barriers/Prognosis Comment -- CHL IP DIET RECOMMENDATION 05/18/2021 SLP Diet Recommendations Dysphagia 1 (Puree) solids;Nectar thick liquid Liquid Administration via Cup;Straw Medication Administration Crushed with puree Compensations Small sips/bites;Slow rate Postural Changes Seated upright at 90 degrees;Remain semi-upright after after feeds/meals (Comment)   CHL IP OTHER RECOMMENDATIONS 05/18/2021 Recommended Consults -- Oral Care Recommendations Oral care BID Other Recommendations --   CHL IP FOLLOW UP RECOMMENDATIONS 05/18/2021 Follow up Recommendations Skilled Nursing facility   [facility] IP FREQUENCY AND DURATION 05/18/2021 Speech Therapy Frequency (ACUTE ONLY) min 2x/week Treatment Duration 2 weeks      CHL IP ORAL PHASE 05/18/2021 Oral Phase Impaired Oral - Pudding Teaspoon -- Oral - Pudding Cup -- Oral - Honey Teaspoon -- Oral - Honey Cup -- Oral - Nectar Teaspoon -- Oral - Nectar Cup -- Oral - Nectar Straw Right anterior bolus loss;Weak lingual manipulation;Decreased bolus cohesion;Premature spillage Oral - Thin Teaspoon -- Oral - Thin Cup Right anterior bolus loss;Weak lingual manipulation;Decreased bolus cohesion;Premature spillage Oral - Thin Straw Right anterior bolus loss;Weak lingual manipulation;Decreased bolus cohesion;Premature spillage Oral - Puree Weak lingual manipulation;Decreased bolus cohesion;Premature spillage;Right pocketing in lateral sulci Oral - Mech Soft Weak lingual manipulation;Decreased bolus  cohesion;Impaired mastication Oral - Regular -- Oral - Multi-Consistency -- Oral - Pill -- Oral Phase - Comment --  CHL IP PHARYNGEAL PHASE 05/18/2021 Pharyngeal Phase Impaired Pharyngeal- Pudding Teaspoon -- Pharyngeal -- Pharyngeal- Pudding Cup -- Pharyngeal -- Pharyngeal- Honey Teaspoon -- Pharyngeal -- Pharyngeal- Honey Cup -- Pharyngeal -- Pharyngeal- Nectar Teaspoon -- Pharyngeal -- Pharyngeal- Nectar Cup -- Pharyngeal -- Pharyngeal- Nectar  Straw Delayed swallow initiation-pyriform sinuses;Penetration/Aspiration during swallow;Penetration/Apiration after swallow;Trace aspiration Pharyngeal Material enters airway, remains ABOVE vocal cords and not ejected out;Material enters airway, passes BELOW cords without attempt by patient to eject out (silent aspiration);Material enters airway, passes BELOW cords and not ejected out despite cough attempt by patient Pharyngeal- Thin Teaspoon -- Pharyngeal -- Pharyngeal- Thin Cup Delayed swallow initiation-pyriform sinuses;Penetration/Aspiration during swallow;Penetration/Apiration after swallow;Moderate aspiration Pharyngeal Material enters airway, passes BELOW cords without attempt by patient to eject out (silent aspiration);Material enters airway, passes BELOW cords and not ejected out despite cough attempt by patient;Material enters airway, CONTACTS cords and not ejected out Pharyngeal- Thin Straw Delayed swallow initiation-pyriform sinuses;Penetration/Aspiration during swallow;Penetration/Apiration after swallow;Trace aspiration Pharyngeal Material enters airway, CONTACTS cords and not ejected out;Material enters airway, passes BELOW cords without attempt by patient to eject out (silent aspiration) Pharyngeal- Puree Delayed swallow initiation-pyriform sinuses;Delayed swallow initiation-vallecula Pharyngeal -- Pharyngeal- Mechanical Soft Delayed swallow initiation-vallecula;Delayed swallow initiation-pyriform sinuses Pharyngeal -- Pharyngeal- Regular -- Pharyngeal --  Pharyngeal- Multi-consistency -- Pharyngeal -- Pharyngeal- Pill -- Pharyngeal -- Pharyngeal Comment --  CHL IP CERVICAL ESOPHAGEAL PHASE 05/18/2021 Cervical Esophageal Phase WFL Pudding Teaspoon -- Pudding Cup -- Honey Teaspoon -- Honey Cup -- Nectar Teaspoon -- Nectar Cup -- Nectar Straw -- Thin Teaspoon -- Thin Cup -- Thin Straw -- Puree -- Mechanical Soft -- Regular -- Multi-consistency -- Pill -- Cervical Esophageal Comment -- Shanika I. Hardin Negus, [place], [place] Office number [phone] Pager [phone] Horton Marshall 05/18/2021, 3:23 PM               Labs:  CBC: Recent Labs    05/19/21 0306 05/20/21 0338 05/21/21 0357 05/23/21 0631  WBC 9.6 10.4 9.2 8.0  HGB 12.7* 12.6* 11.9* 12.3*  HCT 38.6* 39.4 36.8* 38.4*  PLT 207 203 204 219    COAGS: No results for input(s): INR, APTT in the last 8760 hours.  BMP: Recent Labs    05/19/21 0306 05/20/21 0338 05/21/21 0357 05/23/21 0631  NA 138 137 140 142  K 3.3* 3.0* 3.0* 3.2*  CL 99 97* 101 101  CO2 '28 28 31 '$ 32  GLUCOSE 135* 124* 114* 132*  BUN '10 13 19 19  '$ CALCIUM 9.4 9.2 9.6 9.4  CREATININE 1.14 1.21 1.53* 1.17  GFRNONAA >60 >60 52* >60    LIVER FUNCTION TESTS: Recent Labs    05/12/21 1407 05/15/21 1431 05/16/21 0057  BILITOT 1.1 1.0 0.8  AST 18 14* 14*  ALT '8 7 7  '$ ALKPHOS 89 82 71  PROT 8.8* 7.9 7.0  ALBUMIN 4.8 4.3 3.9    TUMOR MARKERS: No results for input(s): AFPTM, CEA, CA199, CHROMGRNA in the last 8760 hours.  Assessment and Plan: 60 y.o. male with history of multiple strokes with chronic right-sided weakness who is currently admitted due to persistent nausea vomiting, in need of long-term feeding tube for nutrition.  IR was requested for image guided gastrostomy tube placement. Case was reviewed and approved by Dr. Dwaine Gale. Patient has been taking Plavix, Plavix discontinued on 05/23/2021.   The procedure is tentatively scheduled for Monday, 05/28/2021 pending IR  schedule.  Made n.p.o. at Monday midnight Ancef ordered No INR since May 2021, INR ordered for tomorrow a.m. CBC on 05/23/2021 stable  Risks and benefits image guided gastrostomy tube placement was discussed with the patient including, but not limited to the need for a barium enema during the procedure, bleeding, infection, peritonitis and/or damage to adjacent structures.  All of the patient's questions were answered,  patient is agreeable to proceed.  Consent signed and in chart.  Thank you for this interesting consult.  I greatly enjoyed meeting [name] and look forward to participating in their care.  A copy of this report was sent to the requesting provider on this date.  Electronically Signed: Tera Mater, PA-C 05/24/2021, 4:07 PM   I spent a total of 40 Minutes    in face to face in clinical consultation, greater than 50% of which was counseling/coordinating care for G-tube placement

## 2021-05-24 NOTE — Progress Notes (Signed)
Tube feeding stopped after increasing to 35cc/hr. Patient vomiting and showing signs of intolerance to feeding. O2 sats continue to be above 92 percent, lungs are clear to ascultation. Physician notified of hold.

## 2021-05-25 ENCOUNTER — Inpatient Hospital Stay (HOSPITAL_COMMUNITY): Payer: Medicaid Other | Admitting: Certified Registered Nurse Anesthetist

## 2021-05-25 ENCOUNTER — Inpatient Hospital Stay (HOSPITAL_COMMUNITY): Payer: Medicaid Other

## 2021-05-25 ENCOUNTER — Encounter (HOSPITAL_COMMUNITY): Payer: Self-pay | Admitting: Internal Medicine

## 2021-05-25 ENCOUNTER — Encounter (HOSPITAL_COMMUNITY)
Admission: EM | Disposition: A | Discharge status: Other Acute Inpatient Hospital | Payer: Self-pay | Source: Skilled Nursing Facility | Attending: Internal Medicine

## 2021-05-25 DIAGNOSIS — Z515 Encounter for palliative care: Secondary | ICD-10-CM | POA: Diagnosis not present

## 2021-05-25 HISTORY — PX: ESOPHAGOGASTRODUODENOSCOPY (EGD) WITH PROPOFOL: SHX5813

## 2021-05-25 LAB — CBC
HCT: 38.5 % — ABNORMAL LOW (ref 39.0–52.0)
Hemoglobin: 12.1 g/dL — ABNORMAL LOW (ref 13.0–17.0)
MCH: 27.2 pg (ref 26.0–34.0)
MCHC: 31.4 g/dL (ref 30.0–36.0)
MCV: 86.5 fL (ref 80.0–100.0)
Platelets: 222 10*3/uL (ref 150–400)
RBC: 4.45 MIL/uL (ref 4.22–5.81)
RDW: 13.6 % (ref 11.5–15.5)
WBC: 9.3 10*3/uL (ref 4.0–10.5)
nRBC: 0 % (ref 0.0–0.2)

## 2021-05-25 LAB — BASIC METABOLIC PANEL
Anion gap: 11 (ref 5–15)
BUN: 28 mg/dL — ABNORMAL HIGH (ref 6–20)
CO2: 30 mmol/L (ref 22–32)
Calcium: 9.4 mg/dL (ref 8.9–10.3)
Chloride: 99 mmol/L (ref 98–111)
Creatinine, Ser: 1.29 mg/dL — ABNORMAL HIGH (ref 0.61–1.24)
GFR, Estimated: >60 mL/min (ref >60)
Glucose, Bld: 120 mg/dL — ABNORMAL HIGH (ref 70–99)
Potassium: 2.9 mmol/L — ABNORMAL LOW (ref 3.5–5.1)
Sodium: 140 mmol/L (ref 135–145)

## 2021-05-25 LAB — GLUCOSE, CAPILLARY
Glucose-Capillary: 101 mg/dL — ABNORMAL HIGH (ref 70–99)
Glucose-Capillary: 103 mg/dL — ABNORMAL HIGH (ref 70–99)
Glucose-Capillary: 120 mg/dL — ABNORMAL HIGH (ref 70–99)
Glucose-Capillary: 123 mg/dL — ABNORMAL HIGH (ref 70–99)
Glucose-Capillary: 129 mg/dL — ABNORMAL HIGH (ref 70–99)
Glucose-Capillary: 145 mg/dL — ABNORMAL HIGH (ref 70–99)

## 2021-05-25 LAB — PROTIME-INR
INR: 1.1 (ref 0.8–1.2)
Prothrombin Time: 14.1 seconds (ref 11.4–15.2)

## 2021-05-25 SURGERY — ESOPHAGOGASTRODUODENOSCOPY (EGD) WITH PROPOFOL
Anesthesia: General

## 2021-05-25 MED ORDER — SODIUM CHLORIDE 0.9 % IV SOLN: INTRAVENOUS | Status: DC

## 2021-05-25 MED ORDER — LIDOCAINE 2% (20 MG/ML) 5 ML SYRINGE
INTRAMUSCULAR | Status: DC | PRN
  Administered 2021-05-25: 60 mg via INTRAVENOUS

## 2021-05-25 MED ORDER — METOCLOPRAMIDE HCL 5 MG/ML IJ SOLN
10.0000 mg | Freq: Three times a day (TID) | INTRAMUSCULAR | Status: AC
  Administered 2021-05-25 – 2021-05-26 (×2): 10 mg via INTRAVENOUS
  Filled 2021-05-25 (×2): qty 2

## 2021-05-25 MED ORDER — LABETALOL HCL 5 MG/ML IV SOLN
10.0000 mg | INTRAVENOUS | Status: DC | PRN
  Administered 2021-05-28 – 2021-05-31 (×5): 10 mg via INTRAVENOUS
  Filled 2021-05-25 (×10): qty 4

## 2021-05-25 MED ORDER — PROPOFOL 10 MG/ML IV BOLUS
INTRAVENOUS | Status: DC | PRN
  Administered 2021-05-25: 150 mg via INTRAVENOUS

## 2021-05-25 MED ORDER — SUCCINYLCHOLINE CHLORIDE 200 MG/10ML IV SOSY
PREFILLED_SYRINGE | INTRAVENOUS | Status: DC | PRN
  Administered 2021-05-25: 120 mg via INTRAVENOUS

## 2021-05-25 MED ORDER — PHENYLEPHRINE 40 MCG/ML (10ML) SYRINGE FOR IV PUSH (FOR BLOOD PRESSURE SUPPORT)
PREFILLED_SYRINGE | INTRAVENOUS | Status: DC | PRN
  Administered 2021-05-25: 160 ug via INTRAVENOUS

## 2021-05-25 MED ORDER — OSMOLITE 1.5 CAL PO LIQD
1000.0000 mL | ORAL | Status: DC
  Administered 2021-05-29 – 2021-05-31 (×4): 1000 mL
  Filled 2021-05-25 (×15): qty 1000

## 2021-05-25 SURGICAL SUPPLY — 15 items

## 2021-05-25 NOTE — Op Note (Signed)
[facility] Patient Name: [name] Procedure Date : 05/25/2021 MRN: [id number] Attending MD: Clarene Essex , MD Date of Birth: 1961/01/09 CSN: [id number] Age: 60 Admit Type: Inpatient Procedure:                Upper GI endoscopy Indications:              Dysphagia, Chronic cough, Persistent vomiting                            feeding problem Providers:                Clarene Essex, MD, Particia Nearing, RN, Tyna Jaksch                            Technician Referring MD:              Medicines:                General Anesthesia Complications:            No immediate complications. Estimated Blood Loss:     Estimated blood loss: none. Procedure:                Pre-Anesthesia Assessment:                           - Prior to the procedure, a History and Physical                            was performed, and patient medications and                            allergies were reviewed. The patient's tolerance of                            previous anesthesia was also reviewed. The risks                            and benefits of the procedure and the sedation                            options and risks were discussed with the patient.                            All questions were answered, and informed consent                            was obtained. Prior Anticoagulants: The patient has                            taken no previous anticoagulant or antiplatelet                            agents. ASA Grade Assessment: III - A patient with                            severe systemic disease.  After reviewing the risks                            and benefits, the patient was deemed in                            satisfactory condition to undergo the procedure.                           After obtaining informed consent, the endoscope was                            passed under direct vision. Throughout the                            procedure, the patient's blood pressure, pulse,  and                            oxygen saturations were monitored continuously. The                            [id number]) Olympus gastroscope was                            introduced through the mouth, and advanced to the                            third part of duodenum. The upper GI endoscopy was                            accomplished without difficulty. The patient                            tolerated the procedure well. Scope In: Scope Out: Findings:      The oropharynx was normal.      A Tiny hiatal hernia was present.      The entire examined stomach was normal.      The duodenal bulb, first portion of the duodenum and third portion of       the duodenum were normal.      A nasal gastric tube was found in the gastric body. At the end of the       procedure it was grabbed with the rat-tooth forcep and advanced into the       duodenum and left there and the scope was removed.      The exam was otherwise without abnormality. Impression:               - Normal oropharynx.                           - Tiny hiatal hernia.                           - Normal stomach.                           -  Normal duodenal bulb, first portion of the                            duodenum and third portion of the duodenum.                           - A gastric tube was found in the stomach.                           - The examination was otherwise normal.                           - A Feeding tube was placed.                           - No specimens collected. Recommendation:           - Retry tube feeds today once x-ray confirms                            feeding tube and duodenum. Also ask radiology to                            fasten feeding tube with nasal clip to keep in place                           - Continue present medications.                           - Return to GI clinic PRN. Will ask rounding                            partner to check on tomorrow keep head of bed                             elevated to 45 degrees at all time                           - Telephone GI clinic if symptomatic PRN. Procedure Code(s):        --- Professional ---                           [phone], Esophagogastroduodenoscopy, flexible,                            transoral; diagnostic, including collection of                            specimen(s) by brushing or washing, when performed                            (separate procedure) Diagnosis Code(s):        --- Professional ---  K44.9, Diaphragmatic hernia without obstruction or                            gangrene                           Z93.1, Gastrostomy status                           R13.10, Dysphagia, unspecified                           R05, Cough                           0000000, Cyclical vomiting syndrome unrelated to                            migraine CPT copyright 2019 American Medical Association. All rights reserved. The codes documented in this report are preliminary and upon coder review may  be revised to meet current compliance requirements. Clarene Essex, MD 05/25/2021 2:21:38 PM This report has been signed electronically. Number of Addenda: 0

## 2021-05-25 NOTE — Progress Notes (Signed)
Patient back from Endoscopy. Patient responding to voice and asking about eating. No cortrack in place at this time. VS checked and within normal limit. Will continue to monitor.

## 2021-05-25 NOTE — Progress Notes (Signed)
[name] 1:53 PM  Subjective: Patient without any new complaints and no further nausea or vomiting however had he has not been fed  Objective: Vital signs stable afebrile exam please see preassessment evaluation labs stable potassium low  Assessment: Nausea vomiting feeding problem probably swallowing problem questionable etiology  Plan: Okay to proceed with endoscopy with anesthesia assistance  [facility]  office [phone] After 5PM or if no answer call [phone]

## 2021-05-25 NOTE — Progress Notes (Signed)
Spke with Hal Hope, MD. Speech made pt NPO until PEG tube is placed, in meantime cortrak was placed. Today cortrak came out in endoscopy, pt meds cannot be given at this time. Reglan changed to IV route and labetalol IV was added. Will hold rest of medications.

## 2021-05-25 NOTE — Transfer of Care (Signed)
Immediate Anesthesia Transfer of Care Note  Patient: [name]  Procedure(s) Performed: ESOPHAGOGASTRODUODENOSCOPY (EGD) WITH PROPOFOL  Patient Location: Endoscopy Unit  Anesthesia Type:General  Level of Consciousness: drowsy and patient cooperative  Airway & Oxygen Therapy: Patient Spontanous Breathing  Post-op Assessment: Report given to RN and Post -op Vital signs reviewed and stable  Post vital signs: Reviewed and stable  Last Vitals:  Vitals Value Taken Time  BP 91/43 05/25/21 1426  Temp    Pulse 73 05/25/21 1427  Resp 24 05/25/21 1427  SpO2 99 % 05/25/21 1427  Vitals shown include unvalidated device data.  Last Pain:  Vitals:   05/25/21 1309  TempSrc: Temporal  PainSc:       Patients Stated Pain Goal: 0 ([id number])  Complications: No notable events documented.

## 2021-05-25 NOTE — TOC Progression Note (Signed)
Transition of Care [facility]) - Progression Note    Patient Details  Name: [name] MRN: [id number] Date of Birth: Jun 28, 1961  [facility] CM/SW Contact  Joanne Chars, LCSW Phone Number: 05/25/2021, 2:52 PM  Clinical Narrative:  CSW spoke with pt daughter who reports that she has picked up pt belongings from [facility].  Discussed that pt currently has no bed offers, [facility] in [place] is only facility currently considering pt.  Daughter will call [facility] to provide information they need.  Daughter aware that facility will be needed by early/mid next week.       Expected Discharge Plan: [place] Barriers to Discharge: Continued Medical Work up, SNF Pending bed offer  Expected Discharge Plan and Services Expected Discharge Plan: [place] Choice: [place] arrangements for the past 2 months: [place]                                       Social Determinants of Health (SDOH) Interventions    Readmission Risk Interventions No flowsheet data found.

## 2021-05-25 NOTE — Progress Notes (Signed)
Pt coughed up feeding tube. DR [facility] here and made aware

## 2021-05-25 NOTE — Anesthesia Procedure Notes (Signed)
Procedure Name: Intubation Date/Time: 05/25/2021 2:00 PM Performed by: Kathryne Hitch, CRNA Pre-anesthesia Checklist: Patient identified, Emergency Drugs available, Suction available and Patient being monitored Patient Re-evaluated:Patient Re-evaluated prior to induction Oxygen Delivery Method: Circle system utilized Preoxygenation: Pre-oxygenation with 100% oxygen Induction Type: IV induction Ventilation: Mask ventilation without difficulty Laryngoscope Size: Mac and 4 Grade View: Grade I Tube type: Oral Tube size: 7.5 mm Number of attempts: 1 Airway Equipment and Method: Stylet and Oral airway Placement Confirmation: ETT inserted through vocal cords under direct vision, positive ETCO2 and breath sounds checked- equal and bilateral Secured at: 22 cm Tube secured with: Tape Dental Injury: Teeth and Oropharynx as per pre-operative assessment

## 2021-05-25 NOTE — Progress Notes (Signed)
Unfortunately patient coughed up feeding tube and in talking to anesthesia he does have a significant gag reflex and possibly all of his problems stem from that and maybe we should retry speech therapy and a dysphagia diet without his feeding tube in place and see if he functions better otherwise consider IR for PEG placement and possibly a duodenal feeding port

## 2021-05-25 NOTE — Anesthesia Postprocedure Evaluation (Signed)
Anesthesia Post Note  Patient: Swayam Corporon  Procedure(s) Performed: ESOPHAGOGASTRODUODENOSCOPY (EGD) WITH PROPOFOL     Patient location during evaluation: PACU Anesthesia Type: General Level of consciousness: awake and alert Pain management: pain level controlled Vital Signs Assessment: post-procedure vital signs reviewed and stable Respiratory status: nonlabored ventilation, respiratory function stable and spontaneous breathing Cardiovascular status: blood pressure returned to baseline and stable Postop Assessment: no apparent nausea or vomiting Anesthetic complications: no   No notable events documented.  Last Vitals:  Vitals:   05/25/21 1446 05/25/21 1459  BP: 108/89 125/84  Pulse: 72 70  Resp: 19 20  Temp:  37.1 C  SpO2: 99% 99%    Last Pain:  Vitals:   05/25/21 1459  TempSrc: Oral  PainSc: 0-No pain                 Jamell Opfer A.

## 2021-05-25 NOTE — Progress Notes (Signed)
PROGRESS NOTE    [name]  [id number] DOB: Jan 03, 1961 DOA: 05/15/2021 PCP: Boyce Medici, FNP   Brief Narrative:  [name] is a 60 y.o. 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 Presented with ongoing nausea and vomiting. Was recently admitted  for persistent nausea vomiting and constipation discharged on 05/14/2021 with resolution of symptoms. During patient's last hospitalization he had a CT of the abdomen pelvis which was negative and a head CT without acute findings.  Patient had been taking Zofran at his facility but it was not helping. During his recent admission was noted to have coffee-ground emesis felt to be secondary to Mallory-Weiss tear GI consulted not recommended holding off on EGD.  Initially nausea and vomiting resolved although it has resumed once patient went back to nursing home facility.  Hemoglobin did remain stable and he was added on PPI.    Patient representing with nausea vomiting extremely poor p.o. intake from facility for reevaluation, and GI to be consulted for their expertise.  Continues to have profound nausea and vomiting even with NG tube feeds likely indicating this is a post dysphagia issue or obstruction.  Appreciate GI insight and recommendations, endoscopy pending later today.  Ultimate disposition likely back to facility once definitive oral intake can be obtained.  Sister updated as available.  Assessment & Plan:   Active Problems:   Hyperlipidemia associated with type 2 diabetes mellitus (HCC)   Hypokalemia   Schizophrenia (HCC)   History of CVA with residual deficit   Chronic combined systolic (congestive) and diastolic (congestive) heart failure (HCC)   Benign essential HTN   Intractable nausea and vomiting   Intractable vomiting with nausea, unspecified  vomiting type  Intractable nausea and vomiting secondary to dysphagia vs mechanical obstruction, POA -N.p.o. today for endoscopic procedure given concern for stricture/stenosis with GI -Speech evaluation ongoing, patient does have some oral spillage due to previous stroke but has improved swallowing capacity per speech evaluation over the past few days. -G-tube placement ordered given profoundly poor p.o. intake in the setting of above, discussed with IR 7/5 - need to wait 5 days due to plavix, patient agreeable -understands the risks and benefits of the procedure, sister agreeable at bedside 05/22/2021 - even if patient able to take PO safely post-endoscopy it is unlikely he will be able to tolerate appropriate caloric intake due to dysphagia.  History of multiple CVAs with residual right-sided weakness-holding Plavix per IR, he is also now on a PPI  Essential hypertension-continue home medications  Hyperlipidemia-continue statin History of schizophrenia-continue home medications Chronic combined CHF-appears euvolemic, continue home medications  Type 2 diabetes mellitus-continue home medications Constipation-resolved with bowel regimen, continue on discharge  DVT prophylaxis: None given previous episodes of hematemesis coffee-ground emesis Code Status: Full Family Communication: unavailable  Status is: Inpatient  Dispo: The patient is from: Facility              Anticipated d/c is to: Same              Anticipated d/c date is: 72+ hours              Patient currently not medically stable for discharge  Consultants:  GI, [facility]  Procedures:  G-tube placement pending IR schedule the week of 05/28/2021 Endoscopy 05/25/2021  Antimicrobials:  Not currently indicated  Subjective: No acute issues or events overnight, patient  continues to complain of hunger and fatigue but denies headache fever chills or chest pain.  Somewhat anxious about endoscopy procedure.  Objective: Vitals:    05/24/21 2042 05/25/21 0016 05/25/21 0345 05/25/21 0712  BP: (!) 146/91 132/86 133/86 131/85  Pulse: 80 76 68 90  Resp: '20 20 18 16  '$ Temp: 98.1 F (36.7 C) 98 F (36.7 C) 98.2 F (36.8 C) 98.8 F (37.1 C)  TempSrc: Oral Oral Oral Oral  SpO2: 99% 96% 98% 100%  Weight:   81 kg   Height:        Intake/Output Summary (Last 24 hours) at 05/25/2021 0746 Last data filed at 05/25/2021 0431 Gross per 24 hour  Intake 400 ml  Output 850 ml  Net -450 ml    Filed Weights   05/23/21 0500 05/24/21 0500 05/25/21 0345  Weight: 80.1 kg 81.4 kg 81 kg    Examination:  General:  Pleasantly resting in bed, No acute distress. HEENT: Right-sided facial droop with oral spillage; cortrak tube to left nare Neck:  Without mass or deformity.  Trachea is midline.,  Lungs: Coarse upper airway sounds without overt rales or wheeze. Heart:  Regular rate and rhythm.  Without murmurs, rubs, or gallops. Abdomen:  Soft, nontender, nondistended.  Without guarding or rebound. Extremities: Without cyanosis, clubbing, edema; right hemiparesis Vascular:  Dorsalis pedis and posterior tibial pulses palpable bilaterally. Skin:  Warm and dry, no erythema   Data Reviewed: I have personally reviewed following labs and imaging studies  CBC: Recent Labs  Lab 05/19/21 0306 05/20/21 0338 05/21/21 0357 05/23/21 0631 05/25/21 0554  WBC 9.6 10.4 9.2 8.0 9.3  HGB 12.7* 12.6* 11.9* 12.3* 12.1*  HCT 38.6* 39.4 36.8* 38.4* 38.5*  MCV 83.9 85.3 85.2 86.1 86.5  PLT 207 203 204 219 [id number]    Basic Metabolic Panel: Recent Labs  Lab 05/19/21 0306 05/20/21 0338 05/21/21 0357 05/23/21 0631 05/25/21 0554  NA 138 137 140 142 140  K 3.3* 3.0* 3.0* 3.2* 2.9*  CL 99 97* 101 101 99  CO2 '28 28 31 '$ 32 30  GLUCOSE 135* 124* 114* 132* 120*  BUN '10 13 19 19 '$ 28*  CREATININE 1.14 1.21 1.53* 1.17 1.29*  CALCIUM 9.4 9.2 9.6 9.4 9.4    GFR: Estimated Creatinine Clearance: 66.5 mL/min (A) (by C-G formula based on SCr of 1.29 mg/dL  (H)). Liver Function Tests: No results for input(s): AST, ALT, ALKPHOS, BILITOT, PROT, ALBUMIN in the last 168 hours.  No results for input(s): LIPASE, AMYLASE in the last 168 hours.  No results for input(s): AMMONIA in the last 168 hours. Coagulation Profile: Recent Labs  Lab 05/25/21 0554  INR 1.1   Cardiac Enzymes: No results for input(s): CKTOTAL, CKMB, CKMBINDEX, TROPONINI in the last 168 hours.  BNP (last 3 results) No results for input(s): PROBNP in the last 8760 hours. HbA1C: No results for input(s): HGBA1C in the last 72 hours.  CBG: Recent Labs  Lab 05/24/21 1645 05/24/21 2047 05/25/21 0020 05/25/21 0426 05/25/21 0715  GLUCAP 117* 140* 145* 103* 123*    Lipid Profile: No results for input(s): CHOL, HDL, LDLCALC, TRIG, CHOLHDL, LDLDIRECT in the last 72 hours. Thyroid Function Tests: No results for input(s): TSH, T4TOTAL, FREET4, T3FREE, THYROIDAB in the last 72 hours.  Anemia Panel: No results for input(s): VITAMINB12, FOLATE, FERRITIN, TIBC, IRON, RETICCTPCT in the last 72 hours. Sepsis Labs: No results for input(s): PROCALCITON, LATICACIDVEN in the last 168 hours.   Recent Results (from the past 240 hour(s))  SARS CORONAVIRUS 2 (TAT 6-24 HRS) Nasopharyngeal Nasopharyngeal Swab     Status: None   Collection Time: 05/15/21 10:14 PM   Specimen: Nasopharyngeal Swab  Result Value Ref Range Status   SARS Coronavirus 2 NEGATIVE NEGATIVE Final    Comment: (NOTE) SARS-CoV-2 target nucleic acids are NOT DETECTED.  The SARS-CoV-2 RNA is generally detectable in upper and lower respiratory specimens during the acute phase of infection. Negative results do not preclude SARS-CoV-2 infection, do not rule out co-infections with other pathogens, and should not be used as the sole basis for treatment or other patient management decisions. Negative results must be combined with clinical observations, patient history, and epidemiological information. The  expected result is Negative.  Fact Sheet for Patients: SugarRoll.be  Fact Sheet for Healthcare Providers: https://www.woods-mathews.com/  This test is not yet approved or cleared by the [place] FDA and  has been authorized for detection and/or diagnosis of SARS-CoV-2 by FDA under an Emergency Use Authorization (EUA). This EUA will remain  in effect (meaning this test can be used) for the duration of the COVID-19 declaration under Se ction 564(b)(1) of the Act, 21 U.S.C. section 360bbb-3(b)(1), unless the authorization is terminated or revoked sooner.  Performed at [facility], [place] [address] [place], [place] [address]   MRSA Next Gen by PCR, Nasal     Status: None   Collection Time: 05/16/21 11:24 AM   Specimen: Nasal Mucosa; Nasal Swab  Result Value Ref Range Status   MRSA by PCR Next Gen NOT DETECTED NOT DETECTED Final    Comment: (NOTE) The GeneXpert MRSA Assay (FDA approved for NASAL specimens only), is one component of a comprehensive MRSA colonization surveillance program. It is not intended to diagnose MRSA infection nor to guide or monitor treatment for MRSA infections. Test performance is not FDA approved in patients less than 57 years old. Performed at [facility], [place] [address], [place], [place] [address]        Radiology Studies: DG Abd Portable 1V  Result Date: 05/23/2021 CLINICAL DATA:  Evaluate feeding tube placement EXAM: PORTABLE ABDOMEN - 1 VIEW COMPARISON:  05/15/21 FINDINGS: The feeding tube tip is well below the level of the GE junction projecting over the expected location of the gastric antrum. Heart size and mediastinal contours appear stable. Lungs are clear. Enteric contrast material identified within the colon at the level of the hepatic flexure. IMPRESSION: Feeding tube tip projects over the gastric antrum. Electronically Signed   By: Kerby Moors M.D.   On: 05/23/2021 12:27     Scheduled Meds:  amLODipine  10 mg Per Tube q morning   atorvastatin  40 mg Per Tube q1800   carvedilol  25 mg Per Tube BID WC   chlorthalidone  50 mg Per Tube Daily   feeding supplement (PROSource TF)  45 mL Per Tube BID   FLUoxetine  10 mg Per Tube Daily   free water  200 mL Per Tube Q4H   glycopyrrolate  1 mg Oral BID   insulin aspart  0-9 Units Subcutaneous Q4H   losartan  75 mg Per Tube Daily   metoCLOPramide  10 mg Per Tube Q8H   multivitamin with minerals  1 tablet Per Tube Daily   paliperidone  6 mg Oral q morning   pantoprazole sodium  40 mg Per Tube Daily   polyethylene glycol  17 g Per Tube BID   senna  2 tablet Per Tube QHS   Continuous Infusions:  sodium chloride     [START ON 05/28/2021]  ceFAZolin (ANCEF) IV     feeding supplement (OSMOLITE 1.5 CAL) Stopped (05/24/21 1037)     LOS: 9 days   Time spent: 35 min  Little Ishikawa, DO [facility]  If 7PM-7AM, please contact night-coverage [email or web address]  05/25/2021, 7:46 AM

## 2021-05-25 NOTE — Anesthesia Preprocedure Evaluation (Addendum)
Anesthesia Evaluation  Patient identified by MRN, date of birth, ID band Patient awake    Reviewed: Allergy & Precautions, NPO status , Patient's Chart, lab work & pertinent test results  Airway Mallampati: II  TM Distance: >3 FB Neck ROM: Full    Dental  (+) Edentulous Upper, Missing, Partial Lower, Dental Advisory Given   Pulmonary neg pulmonary ROS, Current Smoker,    Pulmonary exam normal breath sounds clear to auscultation       Cardiovascular hypertension, +CHF  Normal cardiovascular exam Rhythm:Regular Rate:Normal  Echo 04/2020 1. Left ventricular ejection fraction, by estimation, is 45 to 50%. The left ventricle has mildly decreased function. The left ventricle demonstrates global hypokinesis. Left ventricular diastolic parameters are indeterminate.  2. Right ventricular systolic function is normal. The right ventricular size is normal.  3. The mitral valve is normal in structure. Trivial mitral valve regurgitation. No evidence of mitral stenosis.  4. Unable to determine valve morphology due to image quality. Aortic valve regurgitation is not visualized. No aortic stenosis is present.  5. Aortic dilatation noted. 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  6. The inferior vena cava is dilated in size with >50% respiratory variability, suggesting right atrial pressure of 8 mmHg.    Neuro/Psych PSYCHIATRIC DISORDERS Depression Bipolar Disorder Schizophrenia CVA    GI/Hepatic negative GI ROS, (+)     substance abuse  alcohol use and cocaine use,   Endo/Other  negative endocrine ROSdiabetes  Renal/GU Renal disease     Musculoskeletal  (+) Arthritis ,   Abdominal   Peds  Hematology negative hematology ROS (+)   Anesthesia Other Findings   Reproductive/Obstetrics                            Anesthesia Physical Anesthesia Plan  ASA: 3  Anesthesia Plan: General    Post-op Pain Management:    Induction: Intravenous and Cricoid pressure planned  PONV Risk Score and Plan: 1 and Ondansetron, Dexamethasone and Treatment may vary due to age or medical condition  Airway Management Planned: Oral ETT  Additional Equipment:   Intra-op Plan:   Post-operative Plan: Extubation in OR  Informed Consent: I have reviewed the patients History and Physical, chart, labs and discussed the procedure including the risks, benefits and alternatives for the proposed anesthesia with the patient or authorized representative who has indicated his/her understanding and acceptance.     Dental advisory given  Plan Discussed with: CRNA  Anesthesia Plan Comments:        Anesthesia Quick Evaluation

## 2021-05-26 DIAGNOSIS — R112 Nausea with vomiting, unspecified: Secondary | ICD-10-CM | POA: Diagnosis not present

## 2021-05-26 LAB — GLUCOSE, CAPILLARY
Glucose-Capillary: 103 mg/dL — ABNORMAL HIGH (ref 70–99)
Glucose-Capillary: 108 mg/dL — ABNORMAL HIGH (ref 70–99)
Glucose-Capillary: 110 mg/dL — ABNORMAL HIGH (ref 70–99)
Glucose-Capillary: 111 mg/dL — ABNORMAL HIGH (ref 70–99)
Glucose-Capillary: 90 mg/dL (ref 70–99)
Glucose-Capillary: 94 mg/dL (ref 70–99)

## 2021-05-26 LAB — BASIC METABOLIC PANEL
Anion gap: 11 (ref 5–15)
BUN: 27 mg/dL — ABNORMAL HIGH (ref 6–20)
CO2: 27 mmol/L (ref 22–32)
Calcium: 9.2 mg/dL (ref 8.9–10.3)
Chloride: 101 mmol/L (ref 98–111)
Creatinine, Ser: 0.99 mg/dL (ref 0.61–1.24)
GFR, Estimated: >60 mL/min (ref >60)
Glucose, Bld: 109 mg/dL — ABNORMAL HIGH (ref 70–99)
Potassium: 3.1 mmol/L — ABNORMAL LOW (ref 3.5–5.1)
Sodium: 139 mmol/L (ref 135–145)

## 2021-05-26 MED ORDER — ATORVASTATIN CALCIUM 40 MG PO TABS
40.0000 mg | ORAL_TABLET | Freq: Every day | ORAL | Status: DC
  Administered 2021-05-26 – 2021-05-27 (×2): 40 mg via ORAL
  Filled 2021-05-26 (×2): qty 1

## 2021-05-26 MED ORDER — FLUOXETINE HCL 20 MG/5ML PO SOLN
10.0000 mg | Freq: Every day | ORAL | Status: DC
  Administered 2021-05-27: 10 mg via ORAL
  Filled 2021-05-26 (×2): qty 5

## 2021-05-26 MED ORDER — GUAIFENESIN-DM 100-10 MG/5ML PO SYRP: 15.0000 mL | ORAL_SOLUTION | Freq: Three times a day (TID) | ORAL | Status: DC | PRN

## 2021-05-26 MED ORDER — ONDANSETRON HCL 4 MG PO TABS: 4.0000 mg | ORAL_TABLET | Freq: Four times a day (QID) | ORAL | Status: DC | PRN

## 2021-05-26 MED ORDER — AMLODIPINE BESYLATE 10 MG PO TABS
10.0000 mg | ORAL_TABLET | Freq: Every morning | ORAL | Status: DC
  Administered 2021-05-27: 10 mg via ORAL
  Filled 2021-05-26 (×2): qty 1

## 2021-05-26 MED ORDER — PANTOPRAZOLE SODIUM 40 MG PO PACK: 40.0000 mg | PACK | Freq: Every day | ORAL | Status: DC

## 2021-05-26 MED ORDER — POLYETHYLENE GLYCOL 3350 17 G PO PACK
17.0000 g | PACK | Freq: Two times a day (BID) | ORAL | Status: DC
  Administered 2021-05-26 – 2021-05-27 (×2): 17 g via ORAL
  Filled 2021-05-26 (×2): qty 1

## 2021-05-26 MED ORDER — ACETAMINOPHEN 650 MG RE SUPP: 650.0000 mg | Freq: Four times a day (QID) | RECTAL | Status: DC | PRN

## 2021-05-26 MED ORDER — CARVEDILOL 25 MG PO TABS
25.0000 mg | ORAL_TABLET | Freq: Two times a day (BID) | ORAL | Status: DC
  Administered 2021-05-26 – 2021-05-27 (×3): 25 mg via ORAL
  Filled 2021-05-26 (×4): qty 1

## 2021-05-26 MED ORDER — LOSARTAN POTASSIUM 50 MG PO TABS
75.0000 mg | ORAL_TABLET | Freq: Every day | ORAL | Status: DC
  Administered 2021-05-27: 75 mg via ORAL
  Filled 2021-05-26 (×2): qty 1

## 2021-05-26 MED ORDER — MORPHINE SULFATE (PF) 2 MG/ML IV SOLN
2.0000 mg | INTRAVENOUS | Status: DC | PRN
  Administered 2021-05-26 – 2021-05-28 (×4): 2 mg via INTRAVENOUS
  Filled 2021-05-26 (×4): qty 1

## 2021-05-26 MED ORDER — POTASSIUM CHLORIDE 20 MEQ PO PACK
60.0000 meq | PACK | Freq: Once | ORAL | Status: AC
  Administered 2021-05-26: 60 meq via ORAL
  Filled 2021-05-26: qty 3

## 2021-05-26 MED ORDER — SENNA 8.6 MG PO TABS
2.0000 | ORAL_TABLET | Freq: Every day | ORAL | Status: DC
  Administered 2021-05-26 – 2021-05-27 (×2): 17.2 mg via ORAL
  Filled 2021-05-26 (×2): qty 2

## 2021-05-26 MED ORDER — ACETAMINOPHEN 325 MG PO TABS: 650.0000 mg | ORAL_TABLET | Freq: Four times a day (QID) | ORAL | Status: DC | PRN

## 2021-05-26 MED ORDER — METOCLOPRAMIDE HCL 5 MG/5ML PO SOLN
10.0000 mg | Freq: Three times a day (TID) | ORAL | Status: DC
  Administered 2021-05-26 – 2021-05-28 (×4): 10 mg via ORAL
  Filled 2021-05-26 (×8): qty 10

## 2021-05-26 MED ORDER — ADULT MULTIVITAMIN W/MINERALS CH
1.0000 | ORAL_TABLET | Freq: Every day | ORAL | Status: DC
  Administered 2021-05-27: 1 via ORAL
  Filled 2021-05-26: qty 1

## 2021-05-26 MED ORDER — CHLORTHALIDONE 50 MG PO TABS
50.0000 mg | ORAL_TABLET | Freq: Every day | ORAL | Status: DC
  Administered 2021-05-27: 50 mg via ORAL
  Filled 2021-05-26 (×2): qty 1

## 2021-05-26 NOTE — Progress Notes (Signed)
PROGRESS NOTE    [name]  [id number] DOB: 1960-12-11 DOA: 05/15/2021 PCP: Boyce Medici, FNP   Chief Complain:Nausea .vomiting   Brief Narrative:  [name] is a 60 y.o. 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 Presented with ongoing nausea and vomiting. Was recently admitted  for persistent nausea vomiting and constipation discharged on 05/14/2021 with resolution of symptoms. During patient's last hospitalization he had a CT of the abdomen pelvis which was negative and a head CT without acute findings.  Patient had been taking Zofran at his facility but it was not helping. During his recent admission was noted to have coffee-ground emesis felt to be secondary to Mallory-Weiss tear ,GI consulted who recommended holding off on EGD.  Initially nausea and vomiting resolved although it has resumed once patient went back to nursing home facility.    GI consulted and he underwent EGD on 05/26/21.  Gastric tube was advanced in the duodenum but he coughed  it out.GI recommended IR guided PEG placement.Speech following ultimate disposition likely back to facility once definitive oral intake can be obtained.    Assessment & Plan:   Active Problems:   Hyperlipidemia associated with type 2 diabetes mellitus (HCC)   Hypokalemia   Schizophrenia ([place])   History of CVA with residual deficit   Chronic combined systolic (congestive) and diastolic (congestive) heart failure (HCC)   Benign essential HTN   Intractable nausea and vomiting   Intractable vomiting with nausea, unspecified vomiting type  Intractable nausea and vomiting secondary to dysphagia vs mechanical obstruction, POA -GI consulted and he underwent EGD on 05/26/21.  NG tube was advanced in the duodenum but he coughed  it out.GI recommended IR guided  G tube placement. -Speech evaluation ongoing, patient does have some oral spillage due to previous stroke but has improved swallowing capacity per speech evaluation over the past few days. -G-tube placement ordered given profoundly poor p.o. intake in the setting of above, discussed with IR 7/5 - need to wait 5 days due to plavix, patient agreeable ,plan for 7/11 -understands the risks and benefits of the procedure, sister agreeable  -GI started dysphagia 1 diet but he was coughing. -Continue tube feeding   History of multiple CVAs with residual right-sided weakness-holding Plavix per IR, he is also now on a PPI   Essential hypertension-continue home medications .  Continue as needed medications for severe hypertension  Hyperlipidemia-continue statin  History of schizophrenia-continue home medications  Chronic combined CHF-appears euvolemic, continue home medications   Type 2 diabetes mellitus-continue current regimen, monitor blood sugars  Constipation-resolved with bowel regimen, continue bowel regimen      Nutrition Problem: Inadequate oral intake Etiology: nausea, vomiting      DVT prophylaxis:None/scd Code Status: Full Family Communication: None at bedside Status is: Inpatient  Remains inpatient appropriate because:Unsafe d/c plan  Dispo: The patient is from: SNF              Anticipated d/c is to: SNF              Patient currently is not medically stable to d/c.   Difficult to place patient No    Consultants: GI,IR  Procedures:EGD  Antimicrobials:  Anti-infectives (From admission, onward)    Start     Dose/Rate Route Frequency Ordered Stop   05/28/21 0000  ceFAZolin (ANCEF) IVPB 2g/100 mL premix  2 g 200 mL/hr over 30 Minutes Intravenous To Radiology 05/24/21 1637 05/29/21 0000       Subjective:  Patient seen and examined the bedside this afternoon.  Very deconditioned, debilitated, weak, lying on the bed.  Denies any complaints.  I got a  report that he was coughing up on dysphagia 1 diet.   Objective: Vitals:   05/26/21 0001 05/26/21 0400 05/26/21 0716 05/26/21 1126  BP: 123/85 (!) 141/73 (!) 105/55 104/88  Pulse: 66 69 65 73  Resp: '18 12 20 16  '$ Temp: 98.3 F (36.8 C) 98.5 F (36.9 C) 98.9 F (37.2 C) 97.7 F (36.5 C)  TempSrc: Oral Oral Oral Oral  SpO2: 97% 97% 98% 96%  Weight:      Height:        Intake/Output Summary (Last 24 hours) at 05/26/2021 1359 Last data filed at 05/25/2021 1600 Gross per 24 hour  Intake 81.5 ml  Output --  Net 81.5 ml   Filed Weights   05/23/21 0500 05/24/21 0500 05/25/21 0345  Weight: 80.1 kg 81.4 kg 81 kg    Examination:  General exam: Very deconditioned, debilitated, weak, appears older than age Respiratory system: Bilateral equal air entry, normal vesicular breath sounds, no wheezes or crackles  Cardiovascular system: S1 & S2 heard, RRR. No JVD, murmurs, rubs, gallops or clicks. No pedal edema. Gastrointestinal system: Abdomen is nondistended, soft and nontender. No organomegaly or masses felt. Normal bowel sounds heard. Central nervous system: Alert and awake, right-sided residual weakness  extremities: No edema, no clubbing ,no cyanosis Skin: No rashes, lesions or ulcers,no icterus ,no pallor   Data Reviewed: I have personally reviewed following labs and imaging studies  CBC: Recent Labs  Lab 05/20/21 0338 05/21/21 0357 05/23/21 0631 05/25/21 0554  WBC 10.4 9.2 8.0 9.3  HGB 12.6* 11.9* 12.3* 12.1*  HCT 39.4 36.8* 38.4* 38.5*  MCV 85.3 85.2 86.1 86.5  PLT 203 204 219 [id number]   Basic Metabolic Panel: Recent Labs  Lab 05/20/21 0338 05/21/21 0357 05/23/21 0631 05/25/21 0554 05/26/21 0758  NA 137 140 142 140 139  K 3.0* 3.0* 3.2* 2.9* 3.1*  CL 97* 101 101 99 101  CO2 28 31 32 30 27  GLUCOSE 124* 114* 132* 120* 109*  BUN '13 19 19 '$ 28* 27*  CREATININE 1.21 1.53* 1.17 1.29* 0.99  CALCIUM 9.2 9.6 9.4 9.4 9.2   GFR: Estimated Creatinine Clearance: 86.6 mL/min  (by C-G formula based on SCr of 0.99 mg/dL). Liver Function Tests: No results for input(s): AST, ALT, ALKPHOS, BILITOT, PROT, ALBUMIN in the last 168 hours. No results for input(s): LIPASE, AMYLASE in the last 168 hours. No results for input(s): AMMONIA in the last 168 hours. Coagulation Profile: Recent Labs  Lab 05/25/21 0554  INR 1.1   Cardiac Enzymes: No results for input(s): CKTOTAL, CKMB, CKMBINDEX, TROPONINI in the last 168 hours. BNP (last 3 results) No results for input(s): PROBNP in the last 8760 hours. HbA1C: No results for input(s): HGBA1C in the last 72 hours. CBG: Recent Labs  Lab 05/25/21 2004 05/26/21 0000 05/26/21 0434 05/26/21 0718 05/26/21 1124  GLUCAP 101* 110* 108* 111* 103*   Lipid Profile: No results for input(s): CHOL, HDL, LDLCALC, TRIG, CHOLHDL, LDLDIRECT in the last 72 hours. Thyroid Function Tests: No results for input(s): TSH, T4TOTAL, FREET4, T3FREE, THYROIDAB in the last 72 hours. Anemia Panel: No results for input(s): VITAMINB12, FOLATE, FERRITIN, TIBC, IRON, RETICCTPCT in the last 72 hours. Sepsis Labs: No results for input(s): PROCALCITON,  LATICACIDVEN in the last 168 hours.  No results found for this or any previous visit (from the past 240 hour(s)).       Radiology Studies: No results found.      Scheduled Meds:  [START ON 05/27/2021] amLODipine  10 mg Oral q morning   atorvastatin  40 mg Oral q1800   carvedilol  25 mg Oral BID WC   [START ON 05/27/2021] chlorthalidone  50 mg Oral Daily   feeding supplement (PROSource TF)  45 mL Per Tube BID   [START ON 05/27/2021] FLUoxetine  10 mg Oral Daily   free water  200 mL Per Tube Q4H   glycopyrrolate  1 mg Oral BID   insulin aspart  0-9 Units Subcutaneous Q4H   [START ON 05/27/2021] losartan  75 mg Oral Daily   metoCLOPramide  10 mg Oral Q8H   [START ON 05/27/2021] multivitamin with minerals  1 tablet Oral Daily   paliperidone  6 mg Oral q morning   [START ON 05/27/2021]  pantoprazole sodium  40 mg Oral Daily   polyethylene glycol  17 g Oral BID   senna  2 tablet Oral QHS   Continuous Infusions:  [START ON 05/28/2021]  ceFAZolin (ANCEF) IV     feeding supplement (OSMOLITE 1.5 CAL)       LOS: 10 days    Time spent: 35 mins.More than 50% of that time was spent in counseling and/or coordination of care.      Shelly Coss, MD [facility] P7/07/2021, 1:59 PM

## 2021-05-26 NOTE — Progress Notes (Signed)
[facility] Gastroenterology Progress Note  [name] 60 y.o. 14-Mar-1961  CC: Feeding difficulties   Subjective: Patient seen and examined at bedside.  Not able to get appropriate history from patient.  RN at bedside.  Yesterday's events noted.  Underwent EGD yesterday which was normal.  Gastric tube was advanced in the duodenum but subsequently patient coughed it out.  ROS : Not able to obtain   Objective: Vital signs in last 24 hours: Vitals:   05/26/21 0400 05/26/21 0716  BP: (!) 141/73 (!) 105/55  Pulse: 69 65  Resp: 12 20  Temp: 98.5 F (36.9 C) 98.9 F (37.2 C)  SpO2: 97% 98%    Physical Exam: General  : Resting comfortably.  Not in acute distress Lungs : Visible respiratory distress Abdomen : Soft, nontender, nondistended, bowel sounds present.  No peritoneal sign Psych : Mood normal Neuro : Confused   Lab Results: Recent Labs    05/25/21 0554 05/26/21 0758  NA 140 139  K 2.9* 3.1*  CL 99 101  CO2 30 27  GLUCOSE 120* 109*  BUN 28* 27*  CREATININE 1.29* 0.99  CALCIUM 9.4 9.2   No results for input(s): AST, ALT, ALKPHOS, BILITOT, PROT, ALBUMIN in the last 72 hours. Recent Labs    05/25/21 0554  WBC 9.3  HGB 12.1*  HCT 38.5*  MCV 86.5  PLT 222   Recent Labs    05/25/21 0554  LABPROT 14.1  INR 1.1      Assessment/Plan: -Feeding difficulties most likely oropharyngeal dysphagia enhanced by significant gag reflex -History of substance abuse -History of CVA with residual weakness and dysarthria -Schizophrenia  Recommendations ------------------------- -Okay to try dysphagia 1 diet -Recommend speech therapy evaluation -IR consult for PEG tube placement with duodenal feeding port -No further inpatient GI work-up planned.  GI will sign off.  Call us back if needed   Otis Brace MD, [place] 05/26/2021, 10:31 AM  Contact #  [phone]

## 2021-05-27 DIAGNOSIS — R112 Nausea with vomiting, unspecified: Secondary | ICD-10-CM | POA: Diagnosis not present

## 2021-05-27 LAB — CBC
HCT: 39.4 % (ref 39.0–52.0)
Hemoglobin: 12.9 g/dL — ABNORMAL LOW (ref 13.0–17.0)
MCH: 27.6 pg (ref 26.0–34.0)
MCHC: 32.7 g/dL (ref 30.0–36.0)
MCV: 84.2 fL (ref 80.0–100.0)
Platelets: 224 10*3/uL (ref 150–400)
RBC: 4.68 MIL/uL (ref 4.22–5.81)
RDW: 13.2 % (ref 11.5–15.5)
WBC: 9.1 10*3/uL (ref 4.0–10.5)
nRBC: 0 % (ref 0.0–0.2)

## 2021-05-27 LAB — GLUCOSE, CAPILLARY
Glucose-Capillary: 107 mg/dL — ABNORMAL HIGH (ref 70–99)
Glucose-Capillary: 107 mg/dL — ABNORMAL HIGH (ref 70–99)
Glucose-Capillary: 113 mg/dL — ABNORMAL HIGH (ref 70–99)
Glucose-Capillary: 114 mg/dL — ABNORMAL HIGH (ref 70–99)
Glucose-Capillary: 119 mg/dL — ABNORMAL HIGH (ref 70–99)
Glucose-Capillary: 123 mg/dL — ABNORMAL HIGH (ref 70–99)
Glucose-Capillary: 165 mg/dL — ABNORMAL HIGH (ref 70–99)

## 2021-05-27 LAB — BASIC METABOLIC PANEL
Anion gap: 14 (ref 5–15)
BUN: 25 mg/dL — ABNORMAL HIGH (ref 6–20)
CO2: 26 mmol/L (ref 22–32)
Calcium: 9.8 mg/dL (ref 8.9–10.3)
Chloride: 100 mmol/L (ref 98–111)
Creatinine, Ser: 0.98 mg/dL (ref 0.61–1.24)
GFR, Estimated: >60 mL/min (ref >60)
Glucose, Bld: 123 mg/dL — ABNORMAL HIGH (ref 70–99)
Potassium: 3 mmol/L — ABNORMAL LOW (ref 3.5–5.1)
Sodium: 140 mmol/L (ref 135–145)

## 2021-05-27 LAB — MAGNESIUM: Magnesium: 2 mg/dL (ref 1.7–2.4)

## 2021-05-27 MED ORDER — POLYETHYLENE GLYCOL 3350 17 G PO PACK: 17.0000 g | PACK | Freq: Every day | ORAL | Status: DC

## 2021-05-27 MED ORDER — POTASSIUM CHLORIDE 10 MEQ/100ML IV SOLN
10.0000 meq | INTRAVENOUS | Status: AC
  Administered 2021-05-27 (×6): 10 meq via INTRAVENOUS
  Filled 2021-05-27 (×6): qty 100

## 2021-05-27 MED ORDER — PROSOURCE PLUS PO LIQD
30.0000 mL | Freq: Two times a day (BID) | ORAL | Status: DC
  Administered 2021-05-27 – 2021-05-29 (×2): 30 mL via ORAL
  Filled 2021-05-27 (×2): qty 30

## 2021-05-27 MED ORDER — PANTOPRAZOLE SODIUM 40 MG PO TBEC
40.0000 mg | DELAYED_RELEASE_TABLET | Freq: Every day | ORAL | Status: DC
  Administered 2021-05-27: 40 mg via ORAL
  Filled 2021-05-27: qty 1

## 2021-05-27 NOTE — Progress Notes (Signed)
  Speech Language Pathology Treatment: Dysphagia  Patient Details Name: [name] MRN: [id number] DOB: [birth year] Today's Date: 05/27/2021 Time: [id number] SLP Time Calculation (min) (ACUTE ONLY): 17 min  Assessment / Plan / Recommendation Clinical Impression  Pt was seen for dysphagia treatment. [name] Applebaum, RN reported that the pt demonstrated coughing with nectar thick liquids via tsp this morning, but tolerated solids without overt s/sx of aspiration. Per pt's RN, pt demonstrated anterior spillage of oral secretions this morning and and speech was more unintelligible than at the time of this treatment session. Pt was alert and cooperative during the session with notable improvements in secretion management and speech intelligibility. Anterior spillage of secretions was not observed during the session and oral suctioning was not required. Pt tolerated puree solids, thin liquids via cup, and nectar thick liquids via cup and straw without overt s/sx of aspiration. Anterior spillage was observed with thin liquids via cup, but not with other consistencies. Pt expressed,  "I thought we were done playing these games; I'm having surgery tomorrow" and stated that he is excited about PEG placement to improve his nutrition since he is tired of his difficulty with swallowing. RN's description of his performance this morning is more typical of what has been seen during more recent sessions. SLP has never seen this pt this late for treatment and questions the impact of this on his improved performance. A dysphagia 1 diet with nectar thick liquids is recommended at this time. SLP anticipates that pt may be able to advance back to thin liquids via cup in the future; however, he is resistant to avoidance of straws d/t anterior spillage and his apparent fluctuation in tolerance may be barrier to this. SLP will continue to follow pt.    HPI HPI: Pt is a 60 y.o. male with ongoing nausea and vomiting after recent  discharge on 05/14/21. During recent admission pt noted to have coffee-ground emesis felt to be secondary to Mallory-Weiss tear GI consulted but recommended holding off on EGD. Palliative Care: full scope, full code. CT head: Age advanced chronic small vessel ischemia with multiple chronic  infarcts and an interval left cerebellar infarct since 2021. 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 MBS 04/19/20:  Oropharyngeal dysphagia with left labial weakness, and a pharyngeal delay; regular texture diet and thin liquids recommended. Cortrak placed 7/6; EGD 7/8 WNL.      SLP Plan  Continue with current plan of care       Recommendations  Diet recommendations: Dysphagia 1 (puree);Nectar-thick liquid Liquids provided via: Cup;Straw Medication Administration: Crushed with puree Compensations: Slow rate;Small sips/bites Postural Changes and/or Swallow Maneuvers: Seated upright 90 degrees                Oral Care Recommendations: Oral care BID Follow up Recommendations:  (TBD) SLP Visit Diagnosis: Dysphagia, oropharyngeal phase (R13.12) Plan: Continue with current plan of care       [name], [place], [place] Office number [phone] Pager [place] 05/27/2021, 4:35 PM

## 2021-05-27 NOTE — Progress Notes (Addendum)
PROGRESS NOTE    [name]  [id number] DOB: 1961/01/02 DOA: 05/15/2021 PCP: Boyce Medici, FNP   Chief Complain:Nausea .vomiting   Brief Narrative:  [name] is a 60 y.o. 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 Presented with ongoing nausea and vomiting. Was recently admitted  for persistent nausea vomiting and constipation discharged on 05/14/2021 with resolution of symptoms. During patient's last hospitalization he had a CT of the abdomen pelvis which was negative and a head CT without acute findings.  Patient had been taking Zofran at his facility but it was not helping. During his recent admission was noted to have coffee-ground emesis felt to be secondary to Mallory-Weiss tear ,GI consulted who recommended holding off on EGD.  Initially nausea and vomiting resolved although it has resumed once patient went back to nursing home facility.    GI consulted and he underwent EGD on 05/26/21. NG tube was placed for feeding purpose. GI recommended IR guided PEG placement.Speech following.ultimate disposition likely back to facility after PEG.    Assessment & Plan:   Active Problems:   Hyperlipidemia associated with type 2 diabetes mellitus (HCC)   Hypokalemia   Schizophrenia ([place])   History of CVA with residual deficit   Chronic combined systolic (congestive) and diastolic (congestive) heart failure (HCC)   Benign essential HTN   Intractable nausea and vomiting   Intractable vomiting with nausea, unspecified vomiting type  Intractable nausea and vomiting secondary to dysphagia vs mechanical obstruction, POA -GI consulted and he underwent EGD on 05/26/21.  NG tube was advanced in the duodenum but he coughed  it out.GI recommended IR guided G tube placement. -Speech evaluation ongoing -G-tube  placement ordered given profoundly poor p.o. intake in the setting of above, discussed with IR 7/5 - need to wait 5 days due to plavix, patient agreeable ,plan for 7/11 -understands the risks and benefits of the procedure, sister agreeable  -GI started dysphagia 1 diet but he has been coughing. -Continue tube feeding   History of multiple CVAs with residual right-sided weakness-holding Plavix per IR, he is also now on a PPI   Essential hypertension-continue home medications .  Continue as needed medications for severe hypertension.  Blood pressure stable this morning.  Hyperlipidemia-continue statin  History of schizophrenia-continue home medications  Chronic combined CHF-appears euvolemic, continue home medications   Type 2 diabetes mellitus-continue current regimen, monitor blood sugars  Constipation-resolved with bowel regimen, continue bowel regimen   Hypokalemia: Continue potassium supplementation     Nutrition Problem: Inadequate oral intake Etiology: nausea, vomiting      DVT prophylaxis:None/scd Code Status: Full Family Communication: None at bedside Status is: Inpatient  Remains inpatient appropriate because:Unsafe d/c plan  Dispo: The patient is from: SNF              Anticipated d/c is to: SNF              Patient currently is not medically stable to d/c.   Difficult to place patient No    Consultants: GI,IR  Procedures:EGD  Antimicrobials:  Anti-infectives (From admission, onward)    Start     Dose/Rate Route Frequency Ordered Stop   05/28/21 0000  ceFAZolin (ANCEF) IVPB 2g/100 mL premix        2 g 200 mL/hr over 30 Minutes Intravenous To Radiology 05/24/21 1637 05/29/21 0000  Subjective:  Patient seen and examined the bedside this morning.  Hemodynamically stable today.  Denies any new complaints.  Plan for  G-tube placement tomorrow  Objective: Vitals:   05/27/21 0000 05/27/21 0423 05/27/21 0729 05/27/21 0800  BP: 115/83 (!) 150/80  (!) 165/101 132/87  Pulse: 60 80 75 66  Resp: '19  10 12  '$ Temp: 98.1 F (36.7 C) 98.1 F (36.7 C) 98.2 F (36.8 C)   TempSrc: Oral Oral Oral   SpO2: 99% 95% 99% 100%  Weight:  80.6 kg    Height:       No intake or output data in the 24 hours ending 05/27/21 1127  Filed Weights   05/24/21 0500 05/25/21 0345 05/27/21 0423  Weight: 81.4 kg 81 kg 80.6 kg    Examination:  General exam: Overall comfortable, not in distress, very deconditioned HEENT: PERRL Respiratory system:  no wheezes or crackles  Cardiovascular system: S1 & S2 heard, RRR.  Gastrointestinal system: Abdomen is nondistended, soft and nontender. Central nervous system: Alert and oriented, weakness on the right side Extremities: No edema, no clubbing ,no cyanosis Skin: No rashes, no ulcers,no icterus     Data Reviewed: I have personally reviewed following labs and imaging studies  CBC: Recent Labs  Lab 05/21/21 0357 05/23/21 0631 05/25/21 0554 05/27/21 0457  WBC 9.2 8.0 9.3 9.1  HGB 11.9* 12.3* 12.1* 12.9*  HCT 36.8* 38.4* 38.5* 39.4  MCV 85.2 86.1 86.5 84.2  PLT 204 219 222 [id number]   Basic Metabolic Panel: Recent Labs  Lab 05/21/21 0357 05/23/21 0631 05/25/21 0554 05/26/21 0758 05/27/21 0456 05/27/21 0457  NA 140 142 140 139  --  140  K 3.0* 3.2* 2.9* 3.1*  --  3.0*  CL 101 101 99 101  --  100  CO2 31 32 30 27  --  26  GLUCOSE 114* 132* 120* 109*  --  123*  BUN 19 19 28* 27*  --  25*  CREATININE 1.53* 1.17 1.29* 0.99  --  0.98  CALCIUM 9.6 9.4 9.4 9.2  --  9.8  MG  --   --   --   --  2.0  --    GFR: Estimated Creatinine Clearance: 87.5 mL/min (by C-G formula based on SCr of 0.98 mg/dL). Liver Function Tests: No results for input(s): AST, ALT, ALKPHOS, BILITOT, PROT, ALBUMIN in the last 168 hours. No results for input(s): LIPASE, AMYLASE in the last 168 hours. No results for input(s): AMMONIA in the last 168 hours. Coagulation Profile: Recent Labs  Lab 05/25/21 0554  INR 1.1   Cardiac  Enzymes: No results for input(s): CKTOTAL, CKMB, CKMBINDEX, TROPONINI in the last 168 hours. BNP (last 3 results) No results for input(s): PROBNP in the last 8760 hours. HbA1C: No results for input(s): HGBA1C in the last 72 hours. CBG: Recent Labs  Lab 05/26/21 2031 05/27/21 0011 05/27/21 0028 05/27/21 0421 05/27/21 0743  GLUCAP 90 119* 114* 107* 107*   Lipid Profile: No results for input(s): CHOL, HDL, LDLCALC, TRIG, CHOLHDL, LDLDIRECT in the last 72 hours. Thyroid Function Tests: No results for input(s): TSH, T4TOTAL, FREET4, T3FREE, THYROIDAB in the last 72 hours. Anemia Panel: No results for input(s): VITAMINB12, FOLATE, FERRITIN, TIBC, IRON, RETICCTPCT in the last 72 hours. Sepsis Labs: No results for input(s): PROCALCITON, LATICACIDVEN in the last 168 hours.  No results found for this or any previous visit (from the past 240 hour(s)).       Radiology Studies: No results found.  Scheduled Meds:  (feeding supplement) PROSource Plus  30 mL Oral BID BM   amLODipine  10 mg Oral q morning   atorvastatin  40 mg Oral q1800   carvedilol  25 mg Oral BID WC   chlorthalidone  50 mg Oral Daily   FLUoxetine  10 mg Oral Daily   free water  200 mL Per Tube Q4H   glycopyrrolate  1 mg Oral BID   insulin aspart  0-9 Units Subcutaneous Q4H   losartan  75 mg Oral Daily   metoCLOPramide  10 mg Oral Q8H   multivitamin with minerals  1 tablet Oral Daily   paliperidone  6 mg Oral q morning   pantoprazole  40 mg Oral Daily   polyethylene glycol  17 g Oral BID   senna  2 tablet Oral QHS   Continuous Infusions:  [START ON 05/28/2021]  ceFAZolin (ANCEF) IV     feeding supplement (OSMOLITE 1.5 CAL)     potassium chloride 10 mEq (05/27/21 1106)     LOS: 11 days    Time spent: 25 mins.More than 50% of that time was spent in counseling and/or coordination of care.      Shelly Coss, MD [facility] P7/08/2021, 11:27 AM

## 2021-05-28 ENCOUNTER — Inpatient Hospital Stay (HOSPITAL_COMMUNITY): Payer: Medicaid Other

## 2021-05-28 DIAGNOSIS — R112 Nausea with vomiting, unspecified: Secondary | ICD-10-CM | POA: Diagnosis not present

## 2021-05-28 HISTORY — PX: IR GASTROSTOMY TUBE MOD SED: IMG625

## 2021-05-28 LAB — GLUCOSE, CAPILLARY
Glucose-Capillary: 105 mg/dL — ABNORMAL HIGH (ref 70–99)
Glucose-Capillary: 112 mg/dL — ABNORMAL HIGH (ref 70–99)
Glucose-Capillary: 117 mg/dL — ABNORMAL HIGH (ref 70–99)
Glucose-Capillary: 121 mg/dL — ABNORMAL HIGH (ref 70–99)
Glucose-Capillary: 123 mg/dL — ABNORMAL HIGH (ref 70–99)
Glucose-Capillary: 132 mg/dL — ABNORMAL HIGH (ref 70–99)

## 2021-05-28 LAB — BASIC METABOLIC PANEL
Anion gap: 11 (ref 5–15)
BUN: 23 mg/dL — ABNORMAL HIGH (ref 6–20)
CO2: 27 mmol/L (ref 22–32)
Calcium: 9.5 mg/dL (ref 8.9–10.3)
Chloride: 101 mmol/L (ref 98–111)
Creatinine, Ser: 1.11 mg/dL (ref 0.61–1.24)
GFR, Estimated: >60 mL/min (ref >60)
Glucose, Bld: 122 mg/dL — ABNORMAL HIGH (ref 70–99)
Potassium: 3.2 mmol/L — ABNORMAL LOW (ref 3.5–5.1)
Sodium: 139 mmol/L (ref 135–145)

## 2021-05-28 MED ORDER — ACETAMINOPHEN 325 MG PO TABS
650.0000 mg | ORAL_TABLET | Freq: Four times a day (QID) | ORAL | Status: DC | PRN
  Administered 2021-06-02 (×2): 650 mg
  Filled 2021-05-28 (×2): qty 2

## 2021-05-28 MED ORDER — GLYCOPYRROLATE 1 MG PO TABS
1.0000 mg | ORAL_TABLET | Freq: Two times a day (BID) | ORAL | Status: DC
  Administered 2021-05-28 – 2021-06-03 (×8): 1 mg
  Filled 2021-05-28 (×8): qty 1

## 2021-05-28 MED ORDER — MIDAZOLAM HCL 2 MG/2ML IJ SOLN
INTRAMUSCULAR | Status: AC
  Filled 2021-05-28: qty 2

## 2021-05-28 MED ORDER — FLUOXETINE HCL 20 MG/5ML PO SOLN
10.0000 mg | Freq: Every day | ORAL | Status: DC
  Administered 2021-05-29 – 2021-06-03 (×4): 10 mg
  Filled 2021-05-28 (×7): qty 5

## 2021-05-28 MED ORDER — ATORVASTATIN CALCIUM 40 MG PO TABS
40.0000 mg | ORAL_TABLET | Freq: Every day | ORAL | Status: DC
  Administered 2021-05-31 – 2021-06-02 (×3): 40 mg
  Filled 2021-05-28 (×3): qty 1

## 2021-05-28 MED ORDER — SENNA 8.6 MG PO TABS
2.0000 | ORAL_TABLET | Freq: Every day | ORAL | Status: DC
  Administered 2021-05-31 – 2021-06-02 (×3): 17.2 mg
  Filled 2021-05-28 (×3): qty 2

## 2021-05-28 MED ORDER — PANTOPRAZOLE SODIUM 40 MG PO PACK
40.0000 mg | PACK | Freq: Every day | ORAL | Status: DC
  Administered 2021-05-29 – 2021-06-03 (×4): 40 mg
  Filled 2021-05-28 (×4): qty 20

## 2021-05-28 MED ORDER — LIDOCAINE HCL 1 % IJ SOLN
INTRAMUSCULAR | Status: AC
  Filled 2021-05-28: qty 20

## 2021-05-28 MED ORDER — SODIUM CHLORIDE 0.9 % IV SOLN
INTRAVENOUS | Status: DC | PRN
  Administered 2021-05-28: 500 mL via INTRAVENOUS

## 2021-05-28 MED ORDER — GLUCAGON HCL RDNA (DIAGNOSTIC) 1 MG IJ SOLR
INTRAMUSCULAR | Status: AC
  Filled 2021-05-28: qty 1

## 2021-05-28 MED ORDER — MIDAZOLAM HCL 2 MG/2ML IJ SOLN
INTRAMUSCULAR | Status: AC | PRN
  Administered 2021-05-28: 0.5 mg via INTRAVENOUS
  Administered 2021-05-28: 1 mg via INTRAVENOUS

## 2021-05-28 MED ORDER — CARVEDILOL 25 MG PO TABS: 25.0000 mg | ORAL_TABLET | Freq: Two times a day (BID) | ORAL | Status: DC

## 2021-05-28 MED ORDER — OXYCODONE HCL 5 MG PO TABS
5.0000 mg | ORAL_TABLET | ORAL | Status: DC | PRN
  Administered 2021-05-28 – 2021-06-01 (×3): 5 mg
  Filled 2021-05-28 (×2): qty 1

## 2021-05-28 MED ORDER — ACETAMINOPHEN 650 MG RE SUPP: 650.0000 mg | Freq: Four times a day (QID) | RECTAL | Status: DC | PRN

## 2021-05-28 MED ORDER — CARVEDILOL 25 MG PO TABS
25.0000 mg | ORAL_TABLET | Freq: Two times a day (BID) | ORAL | Status: DC
  Administered 2021-05-28 – 2021-06-01 (×3): 25 mg
  Filled 2021-05-28 (×2): qty 1

## 2021-05-28 MED ORDER — METOCLOPRAMIDE HCL 5 MG/5ML PO SOLN
10.0000 mg | Freq: Three times a day (TID) | ORAL | Status: DC
  Administered 2021-05-28 – 2021-06-02 (×10): 10 mg
  Filled 2021-05-28 (×18): qty 10

## 2021-05-28 MED ORDER — FENTANYL CITRATE (PF) 100 MCG/2ML IJ SOLN
INTRAMUSCULAR | Status: AC | PRN
  Administered 2021-05-28: 50 ug via INTRAVENOUS
  Administered 2021-05-28: 25 ug via INTRAVENOUS

## 2021-05-28 MED ORDER — OXYCODONE HCL 5 MG PO TABS
5.0000 mg | ORAL_TABLET | ORAL | Status: DC | PRN
  Filled 2021-05-28: qty 1

## 2021-05-28 MED ORDER — IOHEXOL 300 MG/ML  SOLN
50.0000 mL | Freq: Once | INTRAMUSCULAR | Status: AC | PRN
  Administered 2021-05-31: 10 mL

## 2021-05-28 MED ORDER — CHLORTHALIDONE 50 MG PO TABS
50.0000 mg | ORAL_TABLET | Freq: Every day | ORAL | Status: DC
  Administered 2021-05-29: 50 mg
  Filled 2021-05-28 (×3): qty 1

## 2021-05-28 MED ORDER — AMLODIPINE BESYLATE 10 MG PO TABS
10.0000 mg | ORAL_TABLET | Freq: Every morning | ORAL | Status: DC
  Administered 2021-05-28 – 2021-06-01 (×3): 10 mg
  Filled 2021-05-28 (×2): qty 1

## 2021-05-28 MED ORDER — ADULT MULTIVITAMIN W/MINERALS CH
1.0000 | ORAL_TABLET | Freq: Every day | ORAL | Status: DC
  Administered 2021-05-29 – 2021-06-03 (×4): 1
  Filled 2021-05-28 (×4): qty 1

## 2021-05-28 MED ORDER — FENTANYL CITRATE (PF) 100 MCG/2ML IJ SOLN
INTRAMUSCULAR | Status: AC
  Filled 2021-05-28: qty 2

## 2021-05-28 MED ORDER — POLYETHYLENE GLYCOL 3350 17 G PO PACK
17.0000 g | PACK | Freq: Every day | ORAL | Status: DC
  Administered 2021-05-29 – 2021-06-03 (×3): 17 g
  Filled 2021-05-28 (×3): qty 1

## 2021-05-28 MED ORDER — LOSARTAN POTASSIUM 50 MG PO TABS
75.0000 mg | ORAL_TABLET | Freq: Every day | ORAL | Status: DC
  Administered 2021-05-28 – 2021-05-29 (×2): 75 mg
  Filled 2021-05-28: qty 1

## 2021-05-28 MED ORDER — MORPHINE SULFATE (PF) 2 MG/ML IV SOLN
2.0000 mg | INTRAVENOUS | Status: DC | PRN
  Administered 2021-05-28 – 2021-05-31 (×4): 2 mg via INTRAVENOUS
  Filled 2021-05-28 (×3): qty 1

## 2021-05-28 MED ORDER — GUAIFENESIN-DM 100-10 MG/5ML PO SYRP: 15.0000 mL | ORAL_SOLUTION | Freq: Three times a day (TID) | ORAL | Status: DC | PRN

## 2021-05-28 NOTE — Plan of Care (Addendum)
PEG placed in IR today. Patient returns to the unit with high BP (see MAR for interventions) and Dr. Tawanna Solo is notified.   Vomiting post-procedure. Gave PO oxy and BP meds once per VO Dr. Tawanna Solo, as IV BP and pain meds had been ineffective. Otherwise, patient remains NPO. Nursing to to use PEG until tomorrow, and then with an order.    Problem: Education: Goal: Knowledge of General Education information will improve Description: Including pain rating scale, medication(s)/side effects and non-pharmacologic comfort measures Outcome: Progressing   Problem: Health Behavior/Discharge Planning: Goal: Ability to manage health-related needs will improve Outcome: Progressing   Problem: Clinical Measurements: Goal: Ability to maintain clinical measurements within normal limits will improve Outcome: Progressing Goal: Will remain free from infection Outcome: Progressing Goal: Diagnostic test results will improve Outcome: Progressing Goal: Respiratory complications will improve Outcome: Progressing Goal: Cardiovascular complication will be avoided Outcome: Progressing   Problem: Activity: Goal: Risk for activity intolerance will decrease Outcome: Progressing   Problem: Nutrition: Goal: Adequate nutrition will be maintained Outcome: Progressing   Problem: Coping: Goal: Level of anxiety will decrease Outcome: Progressing   Problem: Elimination: Goal: Will not experience complications related to bowel motility Outcome: Progressing Goal: Will not experience complications related to urinary retention Outcome: Progressing   Problem: Pain Managment: Goal: General experience of comfort will improve Outcome: Progressing   Problem: Safety: Goal: Ability to remain free from injury will improve Outcome: Progressing   Problem: Skin Integrity: Goal: Risk for impaired skin integrity will decrease Outcome: Progressing

## 2021-05-28 NOTE — TOC Progression Note (Addendum)
Transition of Care [facility]) - Progression Note    Patient Details  Name: Delontae Salib MRN: [id number] Date of Birth: Jul 10, 1961  Transition [facility]) CM/SW Contact  Joanne Chars, LCSW Phone Number: 05/28/2021, 8:53 AM  Clinical Narrative:   CSW received message from daughter and then spoke to her on the phone.  She is now asking about return to [facility].  Her concern from his prior time there has to do with his previous roommate. CSW LM with Janie at [facility] regarding pt potential return.   1300: Janie at [facility] confirmed pt can return, however, pt same room/roommate is the only opening.  They can work with daughter on roommate change once something else becomes available but not immediately. This was communicated to daughter who is OK with this plan.      Expected Discharge Plan: [place] Barriers to Discharge: Continued Medical Work up, SNF Pending bed offer  Expected Discharge Plan and Services Expected Discharge Plan: [place] Choice: [place] arrangements for the past 2 months: [place]                                       Social Determinants of Health (SDOH) Interventions    Readmission Risk Interventions No flowsheet data found.

## 2021-05-28 NOTE — Progress Notes (Signed)
PT Cancellation Note  Patient Details Name: [name] MRN: [id number] DOB: 13-May-1961   Cancelled Treatment:    Reason Eval/Treat Not Completed: Medical issues which prohibited therapy;Other (comment) (Pt was actively vomiting when PT walked in. Nurse was in room and stated pt needs to be a cancel today.)   Domingo Dimes 05/28/2021, 3:58 PM

## 2021-05-28 NOTE — Progress Notes (Signed)
PROGRESS NOTE    [name]  [id number] DOB: 1961-11-08 DOA: 05/15/2021 PCP: Boyce Medici, FNP   Chief Complain:Nausea .vomiting   Brief Narrative:  [name] is a 60 y.o. 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 Presented with ongoing nausea and vomiting. Was recently admitted  for persistent nausea vomiting and constipation discharged on 05/14/2021 with resolution of symptoms. During patient's last hospitalization he had a CT of the abdomen pelvis which was negative and a head CT without acute findings.  Patient had been taking Zofran at his facility but it was not helping. During his recent admission was noted to have coffee-ground emesis felt to be secondary to Mallory-Weiss tear ,GI consulted who recommended holding off on EGD.  Initially nausea and vomiting resolved although it has resumed once patient went back to nursing home facility.    GI consulted and he underwent EGD on 05/26/21. NG tube was placed for feeding purpose but now has been removed. GI recommended IR guided PEG placement.Speech following.ultimate disposition likely back to facility after PEG.    Assessment & Plan:   Active Problems:   Hyperlipidemia associated with type 2 diabetes mellitus (HCC)   Hypokalemia   Schizophrenia ([place])   History of CVA with residual deficit   Chronic combined systolic (congestive) and diastolic (congestive) heart failure (HCC)   Benign essential HTN   Intractable nausea and vomiting   Intractable vomiting with nausea, unspecified vomiting type  Intractable nausea and vomiting secondary to dysphagia vs mechanical obstruction, POA -GI consulted and he underwent EGD on 05/26/21.  NG tube was advanced in the duodenum but he coughed  it out.GI recommended IR guided G tube placement. -Speech  evaluation ongoing -G-tube placement ordered given profoundly poor p.o. intake in the setting of above, discussed with IR 7/5 - needed to wait 5 days due to plavix, patient agreeable ,plan for today -understands the risks and benefits of the procedure, sister agreeable  -GI started dysphagia 1 diet but he has been coughing.   History of multiple CVAs with residual right-sided weakness-holding Plavix per IR, he is also now on a PPI   Essential hypertension-continue home medications .  Continue as needed medications for severe hypertension when unable to take PO.  Blood pressure is running high  Hyperlipidemia-continue statin  History of schizophrenia-continue home medications  Chronic combined CHF-appears euvolemic, continue home medications   Type 2 diabetes mellitus-continue current regimen, monitor blood sugars  Constipation-resolved with bowel regimen, continue bowel regimen   Hypokalemia: Continue potassium supplementation     Nutrition Problem: Inadequate oral intake Etiology: nausea, vomiting      DVT prophylaxis:None/scd Code Status: Full Family Communication: Called and discussed with daughter on phone today Status is: Inpatient  Remains inpatient appropriate because:Unsafe d/c plan  Dispo: The patient is from: SNF              Anticipated d/c is to: SNF              Patient currently is not medically stable to d/c.   Difficult to place patient No    Consultants: GI,IR  Procedures:EGD  Antimicrobials:  Anti-infectives (From admission, onward)    Start     Dose/Rate Route Frequency Ordered Stop   05/28/21 0000  ceFAZolin (ANCEF) IVPB 2g/100 mL premix        2 g 200 mL/hr over 30 Minutes  Intravenous To Radiology 05/24/21 1637 05/29/21 0000       Subjective:  Patient seen and examined the bedside this morning.  Hypertensive this morning ,he denies any new complaints  Objective: Vitals:   05/28/21 0417 05/28/21 0700 05/28/21 0715 05/28/21 0748  BP:  (!) 147/108   (!) 174/98  Pulse: 82   81  Resp: '19 15 10 '$ (!) 24  Temp: 98.7 F (37.1 C)   97.6 F (36.4 C)  TempSrc:    Oral  SpO2: 99%   98%  Weight:      Height:       No intake or output data in the 24 hours ending 05/28/21 0818  Filed Weights   05/24/21 0500 05/25/21 0345 05/27/21 0423  Weight: 81.4 kg 81 kg 80.6 kg    Examination:  General exam: Overall comfortable, not in distress, very deconditioned HEENT: PERRL Respiratory system:  no wheezes or crackles  Cardiovascular system: S1 & S2 heard, RRR.  Gastrointestinal system: Abdomen is nondistended, soft and nontender. Central nervous system: Alert and oriented,right hemiparesis Extremities: No edema, no clubbing ,no cyanosis Skin: No rashes, no ulcers,no icterus      Data Reviewed: I have personally reviewed following labs and imaging studies  CBC: Recent Labs  Lab 05/23/21 0631 05/25/21 0554 05/27/21 0457  WBC 8.0 9.3 9.1  HGB 12.3* 12.1* 12.9*  HCT 38.4* 38.5* 39.4  MCV 86.1 86.5 84.2  PLT 219 222 [id number]   Basic Metabolic Panel: Recent Labs  Lab 05/23/21 0631 05/25/21 0554 05/26/21 0758 05/27/21 0456 05/27/21 0457  NA 142 140 139  --  140  K 3.2* 2.9* 3.1*  --  3.0*  CL 101 99 101  --  100  CO2 32 30 27  --  26  GLUCOSE 132* 120* 109*  --  123*  BUN 19 28* 27*  --  25*  CREATININE 1.17 1.29* 0.99  --  0.98  CALCIUM 9.4 9.4 9.2  --  9.8  MG  --   --   --  2.0  --    GFR: Estimated Creatinine Clearance: 87.5 mL/min (by C-G formula based on SCr of 0.98 mg/dL). Liver Function Tests: No results for input(s): AST, ALT, ALKPHOS, BILITOT, PROT, ALBUMIN in the last 168 hours. No results for input(s): LIPASE, AMYLASE in the last 168 hours. No results for input(s): AMMONIA in the last 168 hours. Coagulation Profile: Recent Labs  Lab 05/25/21 0554  INR 1.1   Cardiac Enzymes: No results for input(s): CKTOTAL, CKMB, CKMBINDEX, TROPONINI in the last 168 hours. BNP (last 3 results) No results for  input(s): PROBNP in the last 8760 hours. HbA1C: No results for input(s): HGBA1C in the last 72 hours. CBG: Recent Labs  Lab 05/27/21 1612 05/27/21 1925 05/28/21 0000 05/28/21 0419 05/28/21 0759  GLUCAP 113* 165* 105* 132* 112*   Lipid Profile: No results for input(s): CHOL, HDL, LDLCALC, TRIG, CHOLHDL, LDLDIRECT in the last 72 hours. Thyroid Function Tests: No results for input(s): TSH, T4TOTAL, FREET4, T3FREE, THYROIDAB in the last 72 hours. Anemia Panel: No results for input(s): VITAMINB12, FOLATE, FERRITIN, TIBC, IRON, RETICCTPCT in the last 72 hours. Sepsis Labs: No results for input(s): PROCALCITON, LATICACIDVEN in the last 168 hours.  No results found for this or any previous visit (from the past 240 hour(s)).       Radiology Studies: No results found.      Scheduled Meds:  (feeding supplement) PROSource Plus  30 mL Oral BID BM   amLODipine  10 mg Oral q morning   atorvastatin  40 mg Oral q1800   carvedilol  25 mg Oral BID WC   chlorthalidone  50 mg Oral Daily   FLUoxetine  10 mg Oral Daily   free water  200 mL Per Tube Q4H   glycopyrrolate  1 mg Oral BID   insulin aspart  0-9 Units Subcutaneous Q4H   losartan  75 mg Oral Daily   metoCLOPramide  10 mg Oral Q8H   multivitamin with minerals  1 tablet Oral Daily   paliperidone  6 mg Oral q morning   pantoprazole  40 mg Oral Daily   polyethylene glycol  17 g Oral Daily   senna  2 tablet Oral QHS   Continuous Infusions:   ceFAZolin (ANCEF) IV     feeding supplement (OSMOLITE 1.5 CAL)       LOS: 12 days    Time spent: 25 mins.More than 50% of that time was spent in counseling and/or coordination of care.      Shelly Coss, MD [facility] P7/09/2021, 8:18 AM

## 2021-05-28 NOTE — Procedures (Signed)
Interventional Radiology Procedure Note  Procedure: Placement of percutaneous 55F pull-through gastrostomy tube. Complications: None Recommendations: - NPO except for sips and chips remainder of today and overnight - Maintain G-tube to LWS until tomorrow morning  - May advance diet as tolerated and begin using tube tomorrow morning - ice as needed to abdominal wall  Signed,   Dulcy Fanny. Earleen Newport, DO

## 2021-05-28 NOTE — Progress Notes (Signed)
PT Cancellation Note  Patient Details Name: [name] MRN: [id number] DOB: 1961-08-17   Cancelled Treatment:    Reason Eval/Treat Not Completed: Patient at procedure or test/unavailable (Pt in interventional radiology.  Will reattempt as able.)   [name] 05/28/2021, 1:18 PM Marque Bango M,PT Acute Rehab Services [phone] (pager)

## 2021-05-29 DIAGNOSIS — R112 Nausea with vomiting, unspecified: Secondary | ICD-10-CM | POA: Diagnosis not present

## 2021-05-29 LAB — CBC
HCT: 37.9 % — ABNORMAL LOW (ref 39.0–52.0)
Hemoglobin: 12.3 g/dL — ABNORMAL LOW (ref 13.0–17.0)
MCH: 27.3 pg (ref 26.0–34.0)
MCHC: 32.5 g/dL (ref 30.0–36.0)
MCV: 84.2 fL (ref 80.0–100.0)
Platelets: 237 10*3/uL (ref 150–400)
RBC: 4.5 MIL/uL (ref 4.22–5.81)
RDW: 13.4 % (ref 11.5–15.5)
WBC: 11.4 10*3/uL — ABNORMAL HIGH (ref 4.0–10.5)
nRBC: 0 % (ref 0.0–0.2)

## 2021-05-29 LAB — GLUCOSE, CAPILLARY
Glucose-Capillary: 126 mg/dL — ABNORMAL HIGH (ref 70–99)
Glucose-Capillary: 121 mg/dL — ABNORMAL HIGH (ref 70–99)
Glucose-Capillary: 111 mg/dL — ABNORMAL HIGH (ref 70–99)
Glucose-Capillary: 176 mg/dL — ABNORMAL HIGH (ref 70–99)
Glucose-Capillary: 120 mg/dL — ABNORMAL HIGH (ref 70–99)
Glucose-Capillary: 119 mg/dL — ABNORMAL HIGH (ref 70–99)

## 2021-05-29 MED ORDER — ONDANSETRON HCL 4 MG/2ML IJ SOLN
4.0000 mg | Freq: Four times a day (QID) | INTRAMUSCULAR | Status: DC | PRN
  Administered 2021-05-31 – 2021-06-02 (×6): 4 mg via INTRAVENOUS
  Filled 2021-05-29 (×6): qty 2

## 2021-05-29 MED ORDER — PROSOURCE TF PO LIQD
30.0000 mL | Freq: Two times a day (BID) | ORAL | Status: DC
  Administered 2021-05-31 – 2021-06-03 (×6): 30 mL
  Filled 2021-05-29 (×6): qty 45

## 2021-05-29 MED ORDER — BACITRACIN-NEOMYCIN-POLYMYXIN OINTMENT TUBE
1.0000 "application " | TOPICAL_OINTMENT | Freq: Every day | CUTANEOUS | Status: DC
  Administered 2021-05-29 – 2021-06-03 (×6): 1 via TOPICAL
  Filled 2021-05-29 (×2): qty 14

## 2021-05-29 MED ORDER — CLOPIDOGREL BISULFATE 75 MG PO TABS
75.0000 mg | ORAL_TABLET | Freq: Every morning | ORAL | Status: DC
  Administered 2021-05-29: 75 mg via ORAL
  Filled 2021-05-29: qty 1

## 2021-05-29 NOTE — TOC Progression Note (Signed)
Transition of Care [facility]) - Progression Note    Patient Details  Name: [name] MRN: [id number] Date of Birth: 1961/07/21  Transition of [facility]) CM/SW Contact  Joanne Chars, LCSW Phone Number: 05/29/2021, 10:22 AM  Clinical Narrative:   Message from [place] at [facility].  She had to fill pt bed, will still accept pt back but will have to wait for another opening.      Expected Discharge Plan: [place] Barriers to Discharge: Continued Medical Work up, SNF Pending bed offer  Expected Discharge Plan and Services Expected Discharge Plan: [place] Choice: [place] arrangements for the past 2 months: [place]                                       Social Determinants of Health (SDOH) Interventions    Readmission Risk Interventions No flowsheet data found.

## 2021-05-29 NOTE — Progress Notes (Signed)
This is PROGRESS NOTE    [name]  [id number] DOB: 1961/05/07 DOA: 05/15/2021 PCP: Boyce Medici, FNP   Chief Complain:Nausea .vomiting   Brief Narrative:  [name] is a 60 y.o. 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 Presented with ongoing nausea and vomiting. Was recently admitted  for persistent nausea vomiting and constipation discharged on 05/14/2021 with resolution of symptoms. During patient's last hospitalization he had a CT of the abdomen pelvis which was negative and a head CT without acute findings.  Patient had been taking Zofran at his facility but it was not helping. During his recent admission was noted to have coffee-ground emesis felt to be secondary to Mallory-Weiss tear ,GI consulted who recommended holding off on EGD.  Initially nausea and vomiting resolved although it has resumed once patient went back to nursing home facility.    GI consulted and he underwent EGD on 05/26/21. NG tube was placed for feeding purpose but now has been removed. GI recommended IR guided G tube placement and he underwent G-tube placement on 05/28/2021. Patient is  medically  stable for discharge back to the skilled nursing facility as soon as bed is available.  Assessment & Plan:   Active Problems:   Hyperlipidemia associated with type 2 diabetes mellitus (HCC)   Hypokalemia   Schizophrenia ([place])   History of CVA with residual deficit   Chronic combined systolic (congestive) and diastolic (congestive) heart failure (HCC)   Benign essential HTN   Intractable nausea and vomiting   Intractable vomiting with nausea, unspecified vomiting type  Intractable nausea and vomiting secondary to dysphagia vs mechanical obstruction, POA -GI consulted and he underwent EGD on 05/26/21.  NG tube was advanced in  the duodenum but he coughed  it out.GI recommended IR guided G tube placement. - he underwent G-tube placement on 05/28/2021.   History of multiple CVAs with residual right-sided weakness-holding Plavix per IR, he is also now on a PPI   Essential hypertension-continue home medications   Blood pressure stable this mrng  Hyperlipidemia-continue statin  History of schizophrenia-continue home medications  Chronic combined CHF-appears euvolemic, continue home medications   Type 2 diabetes mellitus-continue current regimen, monitor blood sugars  Constipation-resolved with bowel regimen, continue bowel regimen   Hypokalemia: Supplemented     Nutrition Problem: Inadequate oral intake Etiology: nausea, vomiting      DVT prophylaxis:SCD Code Status: Full Family Communication: Called and discussed with daughter on phone on 05/28/21 Status is: Inpatient  Remains inpatient appropriate because:Unsafe d/c plan  Dispo: The patient is from: SNF              Anticipated d/c is to: SNF              Patient currently is not medically stable to d/c.   Difficult to place patient No    Consultants: GI,IR  Procedures:EGD  Antimicrobials:  Anti-infectives (From admission, onward)    Start     Dose/Rate Route Frequency Ordered Stop   05/28/21 0000  ceFAZolin (ANCEF) IVPB 2g/100 mL premix        2 g 200 mL/hr over 30 Minutes Intravenous To Radiology 05/24/21 1637 05/28/21 1341       Subjective:  Patient seen and examined the bedside this morning.  Hemodynamically stable.    He looks little sleepy and did not talk much when I asked him  how is he.New G tube being used   Objective: Vitals:   05/28/21 2300 05/29/21 0300 05/29/21 0741 05/29/21 0744  BP: 113/81  (!) 137/111 122/87  Pulse: 84  99 90  Resp: '20  17 12  '$ Temp: 98.2 F (36.8 C) 98.1 F (36.7 C) 97.9 F (36.6 C)   TempSrc: Oral Oral Oral   SpO2: 92%  100% 100%  Weight:    81 kg  Height:        Intake/Output Summary  (Last 24 hours) at 05/29/2021 0800 Last data filed at 05/28/2021 1600 Gross per 24 hour  Intake 22.4 ml  Output 450 ml  Net -427.6 ml    Filed Weights   05/25/21 0345 05/27/21 0423 05/29/21 0744  Weight: 81 kg 80.6 kg 81 kg    Examination:  General exam: Overall comfortable, not in distress, deconditioned, debilitated HEENT: PERRL Respiratory system:  no wheezes or crackles  Cardiovascular system: S1 & S2 heard, RRR.  Gastrointestinal system: Abdomen is nondistended, soft and nontender.  G-tube Central nervous system: Alert and oriented, right hemiparesis Extremities: No edema, no clubbing ,no cyanosis Skin: No rashes, no ulcers,no icterus        Data Reviewed: I have personally reviewed following labs and imaging studies  CBC: Recent Labs  Lab 05/23/21 0631 05/25/21 0554 05/27/21 0457 05/29/21 0426  WBC 8.0 9.3 9.1 11.4*  HGB 12.3* 12.1* 12.9* 12.3*  HCT 38.4* 38.5* 39.4 37.9*  MCV 86.1 86.5 84.2 84.2  PLT 219 222 224 [id number]   Basic Metabolic Panel: Recent Labs  Lab 05/23/21 0631 05/25/21 0554 05/26/21 0758 05/27/21 0456 05/27/21 0457 05/28/21 1029  NA 142 140 139  --  140 139  K 3.2* 2.9* 3.1*  --  3.0* 3.2*  CL 101 99 101  --  100 101  CO2 32 30 27  --  26 27  GLUCOSE 132* 120* 109*  --  123* 122*  BUN 19 28* 27*  --  25* 23*  CREATININE 1.17 1.29* 0.99  --  0.98 1.11  CALCIUM 9.4 9.4 9.2  --  9.8 9.5  MG  --   --   --  2.0  --   --    GFR: Estimated Creatinine Clearance: 77.3 mL/min (by C-G formula based on SCr of 1.11 mg/dL). Liver Function Tests: No results for input(s): AST, ALT, ALKPHOS, BILITOT, PROT, ALBUMIN in the last 168 hours. No results for input(s): LIPASE, AMYLASE in the last 168 hours. No results for input(s): AMMONIA in the last 168 hours. Coagulation Profile: Recent Labs  Lab 05/25/21 0554  INR 1.1   Cardiac Enzymes: No results for input(s): CKTOTAL, CKMB, CKMBINDEX, TROPONINI in the last 168 hours. BNP (last 3 results) No  results for input(s): PROBNP in the last 8760 hours. HbA1C: No results for input(s): HGBA1C in the last 72 hours. CBG: Recent Labs  Lab 05/28/21 1629 05/28/21 1906 05/28/21 2256 05/29/21 0303 05/29/21 0739  GLUCAP 123* 121* 126* 121* 111*   Lipid Profile: No results for input(s): CHOL, HDL, LDLCALC, TRIG, CHOLHDL, LDLDIRECT in the last 72 hours. Thyroid Function Tests: No results for input(s): TSH, T4TOTAL, FREET4, T3FREE, THYROIDAB in the last 72 hours. Anemia Panel: No results for input(s): VITAMINB12, FOLATE, FERRITIN, TIBC, IRON, RETICCTPCT in the last 72 hours. Sepsis Labs: No results for input(s): PROCALCITON, LATICACIDVEN in the last 168 hours.  No results found for this or any previous visit (from the past 240 hour(s)).       Radiology Studies:  IR GASTROSTOMY TUBE MOD SED  Result Date: 05/28/2021 INDICATION: 60 year old male referred for image guided gastrostomy EXAM: PERC PLACEMENT GASTROSTOMY MEDICATIONS: 2 g Ancef; Antibiotics were administered within 1 hour of the procedure. ANESTHESIA/SEDATION: Versed 1.5 mg IV; Fentanyl 75 mcg IV Moderate Sedation Time:  10 minutes The patient was continuously monitored during the procedure by the interventional radiology nurse under my direct supervision. CONTRAST:  10 cc-administered into the gastric lumen. FLUOROSCOPY TIME:  Fluoroscopy Time: 1 minutes 6 seconds (8 mGy). COMPLICATIONS: None PROCEDURE: Informed written consent was obtained from the patient and the patient's family after a thorough discussion of the procedural risks, benefits and alternatives. All questions were addressed. Maximal Sterile Barrier Technique was utilized including caps, mask, sterile gowns, sterile gloves, sterile drape, hand hygiene and skin antiseptic. A timeout was performed prior to the initiation of the procedure. The epigastrium was prepped with Betadine in a sterile fashion, and a sterile drape was applied covering the operative field. A sterile  gown and sterile gloves were used for the procedure. A 5-French orogastric tube is placed under fluoroscopic guidance. Scout imaging of the abdomen confirms barium within the transverse colon. The stomach was distended with gas. Under fluoroscopic guidance, an 18 gauge needle was utilized to puncture the anterior wall of the body of the stomach. An Amplatz wire was advanced through the needle passing a T fastener into the lumen of the stomach. The T fastener was secured for gastropexy. A 9-French sheath was inserted. A snare was advanced through the 9-French sheath. A Britta Mccreedy was advanced through the orogastric tube. It was snared then pulled out the oral cavity, pulling the snare, as well. The leading edge of the gastrostomy was attached to the snare. It was then pulled down the esophagus and out the percutaneous site. Tube secured in place. Contrast was injected. Patient tolerated the procedure well and remained hemodynamically stable throughout. No complications were encountered and no significant blood loss encountered. IMPRESSION: Status post fluoroscopic placed percutaneous gastrostomy tube, with 20 [place] pull-through. Signed, Dulcy Fanny. Earleen Newport, DO [facility] Radiology Electronically Signed   By: Corrie Mckusick D.O.   On: 05/28/2021 14:57        Scheduled Meds:  (feeding supplement) PROSource Plus  30 mL Oral BID BM   amLODipine  10 mg Per Tube q morning   atorvastatin  40 mg Per Tube q1800   carvedilol  25 mg Per Tube BID WC   chlorthalidone  50 mg Per Tube Daily   FLUoxetine  10 mg Per Tube Daily   free water  200 mL Per Tube Q4H   glycopyrrolate  1 mg Per Tube BID   insulin aspart  0-9 Units Subcutaneous Q4H   losartan  75 mg Per Tube Daily   metoCLOPramide  10 mg Per Tube Q8H   multivitamin with minerals  1 tablet Per Tube Daily   neomycin-bacitracin-polymyxin  1 application Topical Daily   paliperidone  6 mg Oral q morning   pantoprazole  sodium  40 mg Per Tube Daily   polyethylene glycol  17 g Per Tube Daily   senna  2 tablet Per Tube QHS   Continuous Infusions:  sodium chloride 10 mL/hr at 05/28/21 1600   feeding supplement (OSMOLITE 1.5 CAL)       LOS: 13 days    Time spent: 25 mins.More than 50% of that time was spent in counseling and/or coordination of care.      Shelly Coss, MD [facility] P7/10/2021, 8:00 AM

## 2021-05-29 NOTE — Progress Notes (Signed)
PT in room working with pt, pt vomited during session.PRN zofran given. Abdomen appeared slightly distended.  MD made aware. Rate decreased back to 34m/hr.

## 2021-05-29 NOTE — Progress Notes (Addendum)
PT back in to work with patient, patient vomited again with rate at 38m/hr, MD made aware, RN instructed to hold tube feeding for 1-2 hrs.   After holding tube feeding pt. Continued to show issues with tolerance AEB vomiting again despite no tube feeding at 1601.    After further investigation at 5pm it was determined that orders to administer med/feeds via j-port was not entered in error. As RN and RD noted that GI recommended "PEG tube placement with duodenal feeding port," though pt only had a G-tube placed by IR on 7/11, so RN had to use G-port as it was the only access. Of note, RN did observe IR note from 7/11 stating to "maintain G-tube to LIWS until tomorrow morning" and "begin using tube tomorrow morning" with no indication for continued suction nor indication of the presence of a j-tube/port.   In short, pt only has G-tube access and pt is not tolerating TF. Discussed with dietitian and MD. Per discussion and MD instruction, pt will be kept NPO, G-tube will be placed back to suction, and IR has been consulted to re-evaluate pt in the morning.

## 2021-05-29 NOTE — Progress Notes (Signed)
Physical Therapy Treatment Patient Details Name: [name] MRN: [id number] DOB: [birth year] Today's Date: 05/29/2021    History of Present Illness 60 y.o. male presenting to ED 6/28 from [facility] with intractable N/V. Patient with recent admission 05/14/2021 with similar complaint. GI consult not recommended at that time. 7/11 G-tube placed   PMHx significant for multiple CVA's with residual R hemiparesis, dysarthria and neurocognitive impairment, CHF, DMII, HTN, schizoaffective disorder, chronic L hip pain and polysubstance abuse.    PT Comments    Patient again limited during session due to vomiting (despite pre-medication for nausea). Prior to pt vomiting, worked on rolling rt and lt to address soiled pad underneath him. Pt not following commands (even with incr time) to assist, however once rolling to his right initiated, he did reach for rail with his right hand to attempt to assist. Patient did not assist with any other techniques utilized to try to elicit a response/participation. Perhaps pt lethargic from pre-medication with nausea meds?? (And this was not effective for avoiding the vomiting). Will continue efforts and trial of PT.    Follow Up Recommendations  SNF     Equipment Recommendations  Other (comment) (defer)    Recommendations for Other Services       Precautions / Restrictions Precautions Precautions: Fall Precaution Comments: R hemiparesis, significant debility, risk of skin breakdown, frequent dry heaving and vomiting    Mobility  Bed Mobility Overal bed mobility: Needs Assistance Bed Mobility: Rolling Rolling: Total assist         General bed mobility comments: on first attempt to see pt, pt had vomited on himself as I arrived. RN notified and gave him anti-emetic and turned down his rate for tube feeds. On return, able to work on rolling right and left to change his pad, and then pt vomited again. Further PT deferred and RN again made aware.     Transfers                 General transfer comment: NT - n/v  Ambulation/Gait                 Stairs             Wheelchair Mobility    Modified Rankin (Stroke Patients Only) Modified Rankin (Stroke Patients Only) Pre-Morbid Rankin Score: Severe disability Modified Rankin: Severe disability     Balance                                            Cognition Arousal/Alertness: Awake/alert Behavior During Therapy: Flat affect Overall Cognitive Status: No family/caregiver present to determine baseline cognitive functioning Area of Impairment: Attention;Following commands;Problem solving                   Current Attention Level: Sustained   Following Commands: Follows one step commands with increased time;Follows one step commands inconsistently     Problem Solving: Slow processing;Difficulty sequencing;Requires verbal cues;Requires tactile cues;Decreased initiation General Comments: difficulty with rolling despite cues and multiple attempts to either side to clean pt after vomiting during session      Exercises      General Comments        Pertinent Vitals/Pain Pain Assessment: Faces Faces Pain Scale: No hurt    Home Living  Prior Function            PT Goals (current goals can now be found in the care plan section) Acute Rehab PT Goals Patient Stated Goal: unable to elicit goal Time For Goal Achievement: 05/30/21 Potential to Achieve Goals: Good Progress towards PT goals: Not progressing toward goals - comment (vomiting during session)    Frequency    Min 2X/week      PT Plan Current plan remains appropriate (Will continue with PT trial for ability to progress, Hopeful his vomiting will decrease now s/p G-tube and allow him to fully participate.)    Co-evaluation              AM-PAC PT "6 Clicks" Mobility   Outcome Measure  Help needed turning from your back to  your side while in a flat bed without using bedrails?: Total Help needed moving from lying on your back to sitting on the side of a flat bed without using bedrails?: Total Help needed moving to and from a bed to a chair (including a wheelchair)?: Total Help needed standing up from a chair using your arms (e.g., wheelchair or bedside chair)?: Total Help needed to walk in hospital room?: Total Help needed climbing 3-5 steps with a railing? : Total 6 Click Score: 6    End of Session   Activity Tolerance: Treatment limited secondary to medical complications (Comment) (limited by nausea/vomiting) Patient left: in bed;with bed alarm set;with call bell/phone within reach Nurse Communication: Other (comment);Need for lift equipment (vomited) PT Visit Diagnosis: Other abnormalities of gait and mobility (R26.89);Muscle weakness (generalized) (M62.81)     Time: [id number] PT Time Calculation (min) (ACUTE ONLY): 22 min  Charges:  $Therapeutic Activity: 8-22 mins                      Arby Barrette, PT Pager [phone]    Rexanne Mano 05/29/2021, 3:54 PM

## 2021-05-29 NOTE — Progress Notes (Addendum)
Nutrition Follow-up  DOCUMENTATION CODES:   Severe malnutrition in context of chronic illness  INTERVENTION:  Continue with the following TF orders once appropriate tube is placed/ready for use: -Osmolite 1.5 @ 80m/hr, advance Q8H (or as tolerated) to goal rate of 671mhr (156026m-45m90mosource TF BID -200ml42me water Q4H  Provides 2420 kcals, 120g protein, 1189ml 35m water (2389ml t68m free water with flushes)  NUTRITION DIAGNOSIS:   Severe Malnutrition related to chronic illness (dysphagia) as evidenced by severe muscle depletion, severe fat depletion.  ongoing  GOAL:   Patient will meet greater than or equal to 90% of their needs  Plan to address with TF  MONITOR:   Weight trends, Labs, I & O's, TF tolerance  REASON FOR ASSESSMENT:   Consult Enteral/tube feeding initiation and management  ASSESSMENT:   Pt with PMH significant for EtOH abuse, cocaine abuse, type 2 DM, malignant HTN, schizophrenia, CHF, multiple prior strokes w/ persistent dysarthria and residual R-sided weakness presented with ongoing N/V. Pt had recently been admitted w/ same complaints and noted to have coffee-ground emesis thought to be 2/2 Mallory-Weiss tear. GI evaluated pt and recommended holding off on EGD at that time. Pt was discharged to SNF on 05/14/21 w/ what was thought to be the resolution of his symptoms; however, symptoms persisted once pt returned to SNF and pt re-admitted w/ intractable N/V 2/2 dysphagia vs mechanical obstruction.  6/30 diet downgraded to dysphagia 1 with thin liquids 7/01 diet downgraded to dysphagia 1 with nectar thick  7/06 cortrak placement (tip gastric per xray) 7/07 pt made NPO 7/08 s/p EGD (normal per GI) and advancement of Cortrak to duodenum 7/09 pt coughed out Cortrak per GI; dysphagia 1 diet with thin liquids ordered then downgraded to nectar thick liquids 7/11 s/p G-tube placement; pt made NPO   Per GI, pt's feeding difficulties are most likely  oropharyngeal dysphagia enhanced by significant gag reflex; recommended "PEG w/ duodenal feeding port" in GI note on 7/9 (at which time GI also signed off). Despite this recommendation, only a G-tube with no postpyloric access was placed in IR yesterday -- reasoning for placement of G-tube only is unclear at this time as RD observed active order to administer meds/feeds through J-port despite there being no J-port. Discussed with RN who states that she was also confused by the conflicting orders. RN also noted active orders to use J-port, but given lack of J-port, MD orders to start TF (using orders from when pt had Cortrak), and IR note from 7/11 saying tube could be used today, RN initiated Osmolite 1.5 via G-port @ 25ml/hr7mte was advanced to 35ml/hr 74mRN observed pt having emesis, so MD was alerted and RN was instructed to lower rate back to 25ml/hr. 49mhen had continued emesis when working with PT, so MD instructed RN to hold TF for 1-2 hours. It was at this point in time when this writer was discovering w/ the RN that this pt appears to have been intended to have a J-port placed per GI's notes, but this was not communicated/not done at time of IR procedure yesterday as pt is confirmed to have only a G-tube upon RD examination. This writer sawProbation officermit again despite TF having been turned off already. RD discussed findings/concerns with RN and MD. Plan made for pt to be made NPO and IR has been consulted to re-evaluate pt in the morning. RD left voicemail for IR providing both cell phone and pager number to discuss ASAP. G-tube  has been placed to suction again. Consider consulting GI to clarify recommendations, but this writer recommends placement of J-port prior to restarting TF. Also recommend CXR as pt was coughing a lot at time of RD visit -- concerned for aspiration pneumonia. Will monitor for results of discussions between MD/GI/IR. Prior TF orders may be started once proper feeding tube is  placed/ready for use, see above for recommendations.    UOP: 479m documented x24 hours Emesis: 3x unmeasured occurrences x24 hours   Weight stable throughout admission  No BM documented since 7/9. Reglan, Senokot, and Miralax ordered 7/11  Medications:  amLODipine  10 mg Per Tube q morning   atorvastatin  40 mg Per Tube q1800   carvedilol  25 mg Per Tube BID WC   chlorthalidone  50 mg Per Tube Daily   clopidogrel  75 mg Oral q morning   feeding supplement (PROSource TF)  30 mL Per Tube BID   FLUoxetine  10 mg Per Tube Daily   free water  200 mL Per Tube Q4H   glycopyrrolate  1 mg Per Tube BID   insulin aspart  0-9 Units Subcutaneous Q4H   losartan  75 mg Per Tube Daily   metoCLOPramide  10 mg Per Tube Q8H   multivitamin with minerals  1 tablet Per Tube Daily   neomycin-bacitracin-polymyxin  1 application Topical Daily   paliperidone  6 mg Oral q morning   pantoprazole sodium  40 mg Per Tube Daily   polyethylene glycol  17 g Per Tube Daily   senna  2 tablet Per Tube QHS  Continuous Infusions:  sodium chloride 10 mL/hr at 05/28/21 1600   feeding supplement (OSMOLITE 1.5 CAL) Stopped (05/29/21 1601)   Labs: Recent Labs  Lab 05/26/21 0758 05/27/21 0456 05/27/21 0457 05/28/21 1029  NA 139  --  140 139  K 3.1*  --  3.0* 3.2*  CL 101  --  100 101  CO2 27  --  26 27  BUN 27*  --  25* 23*  CREATININE 0.99  --  0.98 1.11  CALCIUM 9.2  --  9.8 9.5  MG  --  2.0  --   --   GLUCOSE 109*  --  123* 122*  CBGs 112-135-114  Nutrition-Focused Physical Exam Flowsheet Row Most Recent Value  Orbital Region Mild depletion  Upper Arm Region Severe depletion  Thoracic and Lumbar Region Mild depletion  Buccal Region Severe depletion  Temple Region Severe depletion  Clavicle Bone Region Moderate depletion  Clavicle and Acromion Bone Region Moderate depletion  Scapular Bone Region Mild depletion  Dorsal Hand Moderate depletion  Patellar Region Severe depletion  Anterior Thigh  Region Severe depletion  Posterior Calf Region Severe depletion  Edema (RD Assessment) None  Hair Reviewed  Eyes Reviewed  Mouth Reviewed  Skin Reviewed  Nails Reviewed      Diet Order:   Diet Order             Diet NPO time specified  Diet effective midnight                   EDUCATION NEEDS:   No education needs have been identified at this time  Skin:  Skin Assessment: Reviewed RN Assessment (skin tear R hand, wound to great and 2nd toes R foot)  Last BM:  7/9  Height:   Ht Readings from Last 1 Encounters:  05/15/21 '5\' 11"'$  (1.803 m)    Weight:   Wt Readings from  Last 1 Encounters:  05/29/21 81 kg    BMI:  Body mass index is 24.91 kg/m.  Estimated Nutritional Needs:   Kcal:  2400-2600  Protein:  120-130g  Fluid:  >2L    Larkin Ina, MS, RD, LDN (she/her/hers) RD pager number and weekend/on-call pager number located in [place].

## 2021-05-30 DIAGNOSIS — R112 Nausea with vomiting, unspecified: Secondary | ICD-10-CM | POA: Diagnosis not present

## 2021-05-30 LAB — BASIC METABOLIC PANEL
Anion gap: 11 (ref 5–15)
BUN: 37 mg/dL — ABNORMAL HIGH (ref 6–20)
CO2: 27 mmol/L (ref 22–32)
Calcium: 9.2 mg/dL (ref 8.9–10.3)
Chloride: 101 mmol/L (ref 98–111)
Creatinine, Ser: 2.1 mg/dL — ABNORMAL HIGH (ref 0.61–1.24)
GFR, Estimated: 36 mL/min — ABNORMAL LOW (ref >60)
Glucose, Bld: 122 mg/dL — ABNORMAL HIGH (ref 70–99)
Potassium: 3.1 mmol/L — ABNORMAL LOW (ref 3.5–5.1)
Sodium: 139 mmol/L (ref 135–145)

## 2021-05-30 LAB — GLUCOSE, CAPILLARY
Glucose-Capillary: 104 mg/dL — ABNORMAL HIGH (ref 70–99)
Glucose-Capillary: 107 mg/dL — ABNORMAL HIGH (ref 70–99)
Glucose-Capillary: 108 mg/dL — ABNORMAL HIGH (ref 70–99)
Glucose-Capillary: 108 mg/dL — ABNORMAL HIGH (ref 70–99)
Glucose-Capillary: 118 mg/dL — ABNORMAL HIGH (ref 70–99)
Glucose-Capillary: 125 mg/dL — ABNORMAL HIGH (ref 70–99)

## 2021-05-30 MED ORDER — SODIUM CHLORIDE 0.9 % IV SOLN: INTRAVENOUS | Status: DC

## 2021-05-30 MED ORDER — POTASSIUM CHLORIDE 10 MEQ/100ML IV SOLN
10.0000 meq | INTRAVENOUS | Status: AC
  Administered 2021-05-30 (×5): 10 meq via INTRAVENOUS
  Filled 2021-05-30: qty 100

## 2021-05-30 NOTE — Plan of Care (Signed)
  Problem: Education: Goal: Knowledge of General Education information will improve Description Including pain rating scale, medication(s)/side effects and non-pharmacologic comfort measures Outcome: Progressing   Problem: Health Behavior/Discharge Planning: Goal: Ability to manage health-related needs will improve Outcome: Progressing   

## 2021-05-30 NOTE — Progress Notes (Signed)
SLP Cancellation Note  Patient Details Name: [name] MRN: [id number] DOB: May 01, 1961   Cancelled treatment:       Reason Eval/Treat Not Completed: Medical issues which prohibited therapy- Pt vomiting TF, NPO and TF on hold.  Not appropriate for swallowing f/u this am.  Will f/u next date.  Arlyce Circle L. Tivis Ringer, [place] CCC/SLP Acute Rehabilitation Services Office number [phone] Pager [phone]    Juan Quam Laurice 05/30/2021, 10:04 AM

## 2021-05-30 NOTE — Progress Notes (Signed)
   Patient Status: [facility] - In-pt  Assessment and Plan: Vomiting Patient s/p gastrostomy tube placement 05/28/21.  Tube appears to be in good position, site intact, however patient not tolerating trickle feeds. Having vomiting.  IR consulted for reassessment.  Dr. Pascal Lux recommends proceeding with conversion of G to [place].  Of note, patient with nausea/vomiting on admission.  Poor PO with poor tolerance was original indication for gastrostomy tube placement.   Attempted to call daughter for consent, however no answer/no voicemail.  Will continue to work towards procedure.   TFs on hold.  G-tube to suction with bilious output.  ______________________________________________________________________   History of Present Illness: [name] is a 61 y.o. male with past medical history of DM, HTN, CHF, alcohol/drug abuse admitted with persistent nausea/vomiting.  Arouses to voice but does not participate in exam or conversation.   Allergies and medications reviewed.   Review of Systems: A 12 point ROS discussed and pertinent positives are indicated in the HPI above.  All other systems are negative.  Review of Systems  Unable to perform ROS: Mental status change   Vital Signs: BP 116/90 (BP Location: Right Arm)   Pulse 81   Temp 98.8 F (37.1 C) (Oral)   Resp 16   Ht '5\' 11"'$  (1.803 m)   Wt 187 lb 2.7 oz (84.9 kg)   SpO2 98%   BMI 26.11 kg/m   Physical Exam Vitals and nursing note reviewed.  Constitutional:      General: He is not in acute distress.    Appearance: He is ill-appearing.  HENT:     Mouth/Throat:     Mouth: Mucous membranes are moist.     Pharynx: Oropharynx is clear.  Cardiovascular:     Rate and Rhythm: Normal rate and regular rhythm.  Pulmonary:     Effort: Pulmonary effort is normal.     Breath sounds: Normal breath sounds.  Abdominal:     General: Abdomen is flat.     Palpations: Abdomen is soft.  Neurological:     General: No focal deficit  present.     Mental Status: He is oriented to person, place, and time. Mental status is at baseline.  Psychiatric:        Mood and Affect: Mood normal.        Behavior: Behavior normal.        Thought Content: Thought content normal.        Judgment: Judgment normal.     Imaging reviewed.   Labs:  COAGS: Recent Labs    05/25/21 0554  INR 1.1    BMP: Recent Labs    05/26/21 0758 05/27/21 0457 05/28/21 1029 05/30/21 0140  NA 139 140 139 139  K 3.1* 3.0* 3.2* 3.1*  CL 101 100 101 101  CO2 '27 26 27 27  '$ GLUCOSE 109* 123* 122* 122*  BUN 27* 25* 23* 37*  CALCIUM 9.2 9.8 9.5 9.2  CREATININE 0.99 0.98 1.11 2.10*  GFRNONAA >60 >60 >60 36*       Electronically Signed: Docia Barrier, PA 05/30/2021, 10:55 AM   I spent a total of 15 minutes in face to face in clinical consultation, greater than 50% of which was counseling/coordinating care for vomiting.

## 2021-05-30 NOTE — TOC Progression Note (Signed)
Transition of Care [facility] - Progression Note    Patient Details  Name: [name] MRN: [id number] Date of Birth: [birth year]  [facility]) CM/SW Contact  Joanne Chars, LCSW Phone Number: 05/30/2021, 10:33 AM  Clinical Narrative:   CSW received message from [facility] that they can accept pt back today.  Per MD, pt not ready for DC, [facility] informed and will attempt to hold the bed.      Expected Discharge Plan: [place] Barriers to Discharge: Continued Medical Work up, SNF Pending bed offer  Expected Discharge Plan and Services Expected Discharge Plan: [place] Choice: [place] arrangements for the past 2 months: [place]                                       Social Determinants of Health (SDOH) Interventions    Readmission Risk Interventions No flowsheet data found.

## 2021-05-30 NOTE — Progress Notes (Signed)
This is PROGRESS NOTE    [name]  [id number] DOB: 10-21-1961 DOA: 05/15/2021 PCP: Boyce Medici, FNP   Chief Complain:Nausea .vomiting   Brief Narrative:  [name] is a 60 y.o. 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 Presented with ongoing nausea and vomiting. Was recently admitted  for persistent nausea vomiting and constipation discharged on 05/14/2021 with resolution of symptoms. During patient's last hospitalization he had a CT of the abdomen pelvis which was negative and a head CT without acute findings.  Patient had been taking Zofran at his facility but it was not helping. During his recent admission was noted to have coffee-ground emesis felt to be secondary to Mallory-Weiss tear ,GI consulted who recommended holding off on EGD.  Initially nausea and vomiting resolved although it has resumed once patient went back to nursing home facility.    GI consulted and he underwent EGD on 05/26/21. NG tube was placed for feeding purpose but now has been removed. GI recommended IR guided G tube placement and he underwent G-tube placement on 05/28/2021 but apparently is not working and patient had several episodes of vomiting on 05/29/2021.  We have requested IR for evaluation of the G-tube   Assessment & Plan:   Active Problems:   Hyperlipidemia associated with type 2 diabetes mellitus (HCC)   Hypokalemia   Schizophrenia ([place])   History of CVA with residual deficit   Chronic combined systolic (congestive) and diastolic (congestive) heart failure (HCC)   Benign essential HTN   Intractable nausea and vomiting   Intractable vomiting with nausea, unspecified vomiting type  Intractable nausea and vomiting secondary to dysphagia vs mechanical obstruction, POA -GI consulted and he underwent EGD on  05/26/21.  NG tube was advanced in the duodenum but he coughed  it out.GI recommended IR guided G tube placement. - he underwent G-tube placement on 05/28/2021. -G-tube not working, he vomited several times on 05/29/2021 -Apparently there is a need of J port,we will follow up with IR   History of multiple CVAs with residual right-sided weakness-holding Plavix per IR, he is also now on a PPI   Essential hypertension-continue home medications   Blood pressure stable this mrng  Hyperlipidemia-continue statin  History of schizophrenia-continue home medications  Chronic combined CHF-appears euvolemic, continue home medications   Type 2 diabetes mellitus-continue current regimen, monitor blood sugars  Constipation-resolved with bowel regimen, continue bowel regimen        Nutrition Problem: Severe Malnutrition Etiology: chronic illness (dysphagia)      DVT prophylaxis:SCD Code Status: Full Family Communication: Called and discussed with daughter on phone on 05/28/21,called again today but call not received Status is: Inpatient  Remains inpatient appropriate because:Unsafe d/c plan  Dispo: The patient is from: SNF              Anticipated d/c is to: SNF              Patient currently is not medically stable to d/c.   Difficult to place patient No    Consultants: GI,IR  Procedures:EGD  Antimicrobials:  Anti-infectives (From admission, onward)    Start     Dose/Rate Route Frequency Ordered Stop   05/28/21 0000  ceFAZolin (ANCEF) IVPB 2g/100 mL premix        2 g 200 mL/hr over 30 Minutes Intravenous To Radiology 05/24/21 1637 05/28/21 1341  Subjective:  Patient seen and examined the bedside this morning.  Hemodynamically stable.  He is not vomiting or nauseous like yesterday.  We have consulted IR for G-tube evaluation.  Objective: Vitals:   05/29/21 2346 05/30/21 0411 05/30/21 0500 05/30/21 0706  BP: 117/80 (!) 119/91  116/90  Pulse: 81 72  81  Resp: '18 14   16  '$ Temp: 98.8 F (37.1 C) 98.6 F (37 C)  98.8 F (37.1 C)  TempSrc: Axillary Axillary  Oral  SpO2: 99% 98%  98%  Weight:   84.9 kg   Height:       No intake or output data in the 24 hours ending 05/30/21 0758   Filed Weights   05/27/21 0423 05/29/21 0744 05/30/21 0500  Weight: 80.6 kg 81 kg 84.9 kg    Examination:  General exam: Overall comfortable, not in distress HEENT: PERRL Respiratory system:  no wheezes or crackles  Cardiovascular system: S1 & S2 heard, RRR.  Gastrointestinal system: Abdomen is nondistended, soft and nontender.G tube Central nervous system: Alert and oriented,right hemiparesis Extremities: No edema, no clubbing ,no cyanosis Skin: No rashes, no ulcers,no icterus         Data Reviewed: I have personally reviewed following labs and imaging studies  CBC: Recent Labs  Lab 05/25/21 0554 05/27/21 0457 05/29/21 0426  WBC 9.3 9.1 11.4*  HGB 12.1* 12.9* 12.3*  HCT 38.5* 39.4 37.9*  MCV 86.5 84.2 84.2  PLT 222 224 [id number]   Basic Metabolic Panel: Recent Labs  Lab 05/25/21 0554 05/26/21 0758 05/27/21 0456 05/27/21 0457 05/28/21 1029 05/30/21 0140  NA 140 139  --  140 139 139  K 2.9* 3.1*  --  3.0* 3.2* 3.1*  CL 99 101  --  100 101 101  CO2 30 27  --  '26 27 27  '$ GLUCOSE 120* 109*  --  123* 122* 122*  BUN 28* 27*  --  25* 23* 37*  CREATININE 1.29* 0.99  --  0.98 1.11 2.10*  CALCIUM 9.4 9.2  --  9.8 9.5 9.2  MG  --   --  2.0  --   --   --    GFR: Estimated Creatinine Clearance: 40.8 mL/min (A) (by C-G formula based on SCr of 2.1 mg/dL (H)). Liver Function Tests: No results for input(s): AST, ALT, ALKPHOS, BILITOT, PROT, ALBUMIN in the last 168 hours. No results for input(s): LIPASE, AMYLASE in the last 168 hours. No results for input(s): AMMONIA in the last 168 hours. Coagulation Profile: Recent Labs  Lab 05/25/21 0554  INR 1.1   Cardiac Enzymes: No results for input(s): CKTOTAL, CKMB, CKMBINDEX, TROPONINI in the last 168 hours. BNP  (last 3 results) No results for input(s): PROBNP in the last 8760 hours. HbA1C: No results for input(s): HGBA1C in the last 72 hours. CBG: Recent Labs  Lab 05/29/21 1717 05/29/21 2011 05/29/21 2349 05/30/21 0410 05/30/21 0707  GLUCAP 120* 119* 118* 125* 108*   Lipid Profile: No results for input(s): CHOL, HDL, LDLCALC, TRIG, CHOLHDL, LDLDIRECT in the last 72 hours. Thyroid Function Tests: No results for input(s): TSH, T4TOTAL, FREET4, T3FREE, THYROIDAB in the last 72 hours. Anemia Panel: No results for input(s): VITAMINB12, FOLATE, FERRITIN, TIBC, IRON, RETICCTPCT in the last 72 hours. Sepsis Labs: No results for input(s): PROCALCITON, LATICACIDVEN in the last 168 hours.  No results found for this or any previous visit (from the past 240 hour(s)).       Radiology Studies: IR GASTROSTOMY TUBE MOD SED  Result Date: 05/28/2021 INDICATION: 60 year old male referred for image guided gastrostomy EXAM: PERC PLACEMENT GASTROSTOMY MEDICATIONS: 2 g Ancef; Antibiotics were administered within 1 hour of the procedure. ANESTHESIA/SEDATION: Versed 1.5 mg IV; Fentanyl 75 mcg IV Moderate Sedation Time:  10 minutes The patient was continuously monitored during the procedure by the interventional radiology nurse under my direct supervision. CONTRAST:  10 cc-administered into the gastric lumen. FLUOROSCOPY TIME:  Fluoroscopy Time: 1 minutes 6 seconds (8 mGy). COMPLICATIONS: None PROCEDURE: Informed written consent was obtained from the patient and the patient's family after a thorough discussion of the procedural risks, benefits and alternatives. All questions were addressed. Maximal Sterile Barrier Technique was utilized including caps, mask, sterile gowns, sterile gloves, sterile drape, hand hygiene and skin antiseptic. A timeout was performed prior to the initiation of the procedure. The epigastrium was prepped with Betadine in a sterile fashion, and a sterile drape was applied covering the operative  field. A sterile gown and sterile gloves were used for the procedure. A 5-French orogastric tube is placed under fluoroscopic guidance. Scout imaging of the abdomen confirms barium within the transverse colon. The stomach was distended with gas. Under fluoroscopic guidance, an 18 gauge needle was utilized to puncture the anterior wall of the body of the stomach. An Amplatz wire was advanced through the needle passing a T fastener into the lumen of the stomach. The T fastener was secured for gastropexy. A 9-French sheath was inserted. A snare was advanced through the 9-French sheath. A Britta Mccreedy was advanced through the orogastric tube. It was snared then pulled out the oral cavity, pulling the snare, as well. The leading edge of the gastrostomy was attached to the snare. It was then pulled down the esophagus and out the percutaneous site. Tube secured in place. Contrast was injected. Patient tolerated the procedure well and remained hemodynamically stable throughout. No complications were encountered and no significant blood loss encountered. IMPRESSION: Status post fluoroscopic placed percutaneous gastrostomy tube, with 20 [place] pull-through. Signed, Dulcy Fanny. Earleen Newport, DO [facility] Electronically Signed   By: Corrie Mckusick D.O.   On: 05/28/2021 14:57        Scheduled Meds:  amLODipine  10 mg Per Tube q morning   atorvastatin  40 mg Per Tube q1800   carvedilol  25 mg Per Tube BID WC   clopidogrel  75 mg Oral q morning   feeding supplement (PROSource TF)  30 mL Per Tube BID   FLUoxetine  10 mg Per Tube Daily   free water  200 mL Per Tube Q4H   glycopyrrolate  1 mg Per Tube BID   insulin aspart  0-9 Units Subcutaneous Q4H   metoCLOPramide  10 mg Per Tube Q8H   multivitamin with minerals  1 tablet Per Tube Daily   neomycin-bacitracin-polymyxin  1 application Topical Daily   paliperidone  6 mg Oral q morning   pantoprazole sodium  40 mg Per  Tube Daily   polyethylene glycol  17 g Per Tube Daily   senna  2 tablet Per Tube QHS   Continuous Infusions:  sodium chloride 10 mL/hr at 05/28/21 1600   sodium chloride     feeding supplement (OSMOLITE 1.5 CAL) Stopped (05/29/21 1601)   potassium chloride       LOS: 14 days    Time spent: 25 mins.More than 50% of that time was spent in counseling and/or coordination of care.      Shelly Coss, MD [facility] P7/13/2022,  7:58 AM

## 2021-05-31 ENCOUNTER — Inpatient Hospital Stay (HOSPITAL_COMMUNITY): Payer: Medicaid Other

## 2021-05-31 DIAGNOSIS — R112 Nausea with vomiting, unspecified: Secondary | ICD-10-CM | POA: Diagnosis not present

## 2021-05-31 HISTORY — PX: IR GASTR TUBE CONVERT GASTR-JEJ PER W/FL MOD SED: IMG2332

## 2021-05-31 LAB — BASIC METABOLIC PANEL
Anion gap: 9 (ref 5–15)
BUN: 33 mg/dL — ABNORMAL HIGH (ref 6–20)
CO2: 26 mmol/L (ref 22–32)
Calcium: 8.9 mg/dL (ref 8.9–10.3)
Chloride: 105 mmol/L (ref 98–111)
Creatinine, Ser: 1.49 mg/dL — ABNORMAL HIGH (ref 0.61–1.24)
GFR, Estimated: 54 mL/min — ABNORMAL LOW (ref >60)
Glucose, Bld: 107 mg/dL — ABNORMAL HIGH (ref 70–99)
Potassium: 3.3 mmol/L — ABNORMAL LOW (ref 3.5–5.1)
Sodium: 140 mmol/L (ref 135–145)

## 2021-05-31 LAB — CBC WITH DIFFERENTIAL/PLATELET
Abs Immature Granulocytes: 0.05 10*3/uL (ref 0.00–0.07)
Basophils Absolute: 0 10*3/uL (ref 0.0–0.1)
Basophils Relative: 0 %
Eosinophils Absolute: 0.2 10*3/uL (ref 0.0–0.5)
Eosinophils Relative: 2 %
HCT: 34.7 % — ABNORMAL LOW (ref 39.0–52.0)
Hemoglobin: 11 g/dL — ABNORMAL LOW (ref 13.0–17.0)
Immature Granulocytes: 1 %
Lymphocytes Relative: 18 %
Lymphs Abs: 1.6 10*3/uL (ref 0.7–4.0)
MCH: 27.2 pg (ref 26.0–34.0)
MCHC: 31.7 g/dL (ref 30.0–36.0)
MCV: 85.7 fL (ref 80.0–100.0)
Monocytes Absolute: 0.9 10*3/uL (ref 0.1–1.0)
Monocytes Relative: 10 %
Neutro Abs: 6.2 10*3/uL (ref 1.7–7.7)
Neutrophils Relative %: 69 %
Platelets: 190 10*3/uL (ref 150–400)
RBC: 4.05 MIL/uL — ABNORMAL LOW (ref 4.22–5.81)
RDW: 13.5 % (ref 11.5–15.5)
WBC: 8.9 10*3/uL (ref 4.0–10.5)
nRBC: 0 % (ref 0.0–0.2)

## 2021-05-31 LAB — GLUCOSE, CAPILLARY
Glucose-Capillary: 103 mg/dL — ABNORMAL HIGH (ref 70–99)
Glucose-Capillary: 106 mg/dL — ABNORMAL HIGH (ref 70–99)
Glucose-Capillary: 164 mg/dL — ABNORMAL HIGH (ref 70–99)
Glucose-Capillary: 77 mg/dL (ref 70–99)
Glucose-Capillary: 95 mg/dL (ref 70–99)
Glucose-Capillary: 99 mg/dL (ref 70–99)

## 2021-05-31 MED ORDER — IOHEXOL 300 MG/ML  SOLN
100.0000 mL | Freq: Once | INTRAMUSCULAR | Status: AC | PRN
  Administered 2021-05-31: 100 mL via INTRAVENOUS

## 2021-05-31 MED ORDER — DEXTROSE 50 % IV SOLN
50.0000 mL | INTRAVENOUS | Status: AC
  Administered 2021-05-31: 50 mL via INTRAVENOUS
  Filled 2021-05-31: qty 50

## 2021-05-31 MED ORDER — ONDANSETRON HCL 4 MG PO TABS: 4.0000 mg | ORAL_TABLET | Freq: Four times a day (QID) | ORAL | Status: DC | PRN

## 2021-05-31 MED ORDER — IOHEXOL 300 MG/ML  SOLN
50.0000 mL | Freq: Once | INTRAMUSCULAR | Status: AC | PRN
  Administered 2021-05-31: 50 mL

## 2021-05-31 MED ORDER — LIDOCAINE VISCOUS HCL 2 % MT SOLN
OROMUCOSAL | Status: AC
  Filled 2021-05-31: qty 15

## 2021-05-31 MED ORDER — PROCHLORPERAZINE EDISYLATE 10 MG/2ML IJ SOLN
10.0000 mg | Freq: Four times a day (QID) | INTRAMUSCULAR | Status: DC | PRN
  Filled 2021-05-31 (×2): qty 2

## 2021-05-31 MED ORDER — DEXTROSE-NACL 5-0.9 % IV SOLN: INTRAVENOUS | Status: DC

## 2021-05-31 MED ORDER — LABETALOL HCL 5 MG/ML IV SOLN
20.0000 mg | Freq: Once | INTRAVENOUS | Status: AC
  Administered 2021-05-31: 20 mg via INTRAVENOUS

## 2021-05-31 MED ORDER — POTASSIUM CHLORIDE 10 MEQ/100ML IV SOLN
10.0000 meq | INTRAVENOUS | Status: AC
  Administered 2021-05-31 (×4): 10 meq via INTRAVENOUS
  Filled 2021-05-31: qty 100

## 2021-05-31 MED ORDER — IOHEXOL 9 MG/ML PO SOLN: 500.0000 mL | ORAL | Status: DC

## 2021-05-31 MED ORDER — IOHEXOL 9 MG/ML PO SOLN
500.0000 mL | ORAL | Status: AC
  Administered 2021-05-31 (×2): 500 mL

## 2021-05-31 NOTE — Progress Notes (Signed)
Pt started back on feeding aate Mode '@25'$ /hr  by Adhikari,MD as tolerated by pt.

## 2021-05-31 NOTE — Progress Notes (Signed)
Contrast for CT abdomen administered via G-tube after confirming okay with pharmacy. Patient continued to cough but not vomiting at this time. Low suction off at this time. Will contd to monitor.

## 2021-05-31 NOTE — TOC Progression Note (Signed)
Transition of Care [facility]) - Progression Note    Patient Details  Name: [name] MRN: [id number] Date of Birth: 10-10-1961  [facility] CM/SW Contact  Joanne Chars, LCSW Phone Number: 05/31/2021, 3:22 PM  Clinical Narrative:  Pt had procedure today with feeding tube.  Not medically ready for return to [facility].  TOC continues to follow.       Expected Discharge Plan: [place] Barriers to Discharge: Continued Medical Work up, SNF Pending bed offer  Expected Discharge Plan and Services Expected Discharge Plan: [place] Choice: [place] arrangements for the past 2 months: [place]                                       Social Determinants of Health (SDOH) Interventions    Readmission Risk Interventions No flowsheet data found.

## 2021-05-31 NOTE — Progress Notes (Signed)
In the process of giving second contrast via G-tube, patient vomited 100 ml. MD aware.

## 2021-05-31 NOTE — Progress Notes (Signed)
13:30- Patient had 300 ml green color bile vomitus. MD informed. PRN med given. Noticed that the patient's abdomen is distended. Aspirated 10 ml form the G-tube at this time. Per Post IR order, patient's G and J tube can be used immediately. Gave 1400 reglan with 50 ml water flush through J tube. Patient coughed and gagged. G-tube kept in low intermittent suction per MD order.  Patient's BP continues to be on higher side. PRN medication being administered.

## 2021-05-31 NOTE — Progress Notes (Signed)
PT Cancellation Note  Patient Details Name: [name] MRN: [id number] DOB: [birth year]   Cancelled Treatment:    Reason Eval/Treat Not Completed: Medical issues which prohibited therapy;Other (comment)  Pt recently down for G-tube conversion to G-J-tube. Pt currently vomiting and being cleaned up by nursing.    Arby Barrette, PT Pager [phone]  Rexanne Mano 05/31/2021, 1:34 PM

## 2021-05-31 NOTE — Progress Notes (Signed)
Interventional Radiology Brief Note:  IR PA has attempted to reach out to patient's daughter several times over the past 2 days without no answer, no identifying VM with the intent to confirm she is aware of plans for G to [place] tube conversion.  Planning to proceed in IR today as schedule allows and will continue to work towards communication with family.   Brynda Greathouse, MS RD PA-C 10:05 AM

## 2021-05-31 NOTE — Progress Notes (Signed)
Patient transported to IR via bed.

## 2021-05-31 NOTE — Progress Notes (Signed)
SLP Cancellation Note  Patient Details Name: Fabricio Matsuda MRN: [id number] DOB: 03-Jun-1961   Cancelled treatment:       Reason Eval/Treat Not Completed: Medical issues which prohibited therapy (Plan for G to [place] tube conversion today. SLP will follow up on subsequent date.)  Camika Marsico I. Hardin Negus, [place], [place] Office number [phone] Pager [place] 05/31/2021, 12:33 PM

## 2021-05-31 NOTE — Plan of Care (Signed)
  Problem: Education: Goal: Knowledge of General Education information will improve Description Including pain rating scale, medication(s)/side effects and non-pharmacologic comfort measures Outcome: Progressing   

## 2021-05-31 NOTE — Plan of Care (Signed)
  Problem: Pain Managment: Goal: General experience of comfort will improve Outcome: Progressing   Problem: Safety: Goal: Ability to remain free from injury will improve Outcome: Progressing   Problem: Nutrition: Goal: Adequate nutrition will be maintained Outcome: Not Progressing   

## 2021-05-31 NOTE — Progress Notes (Signed)
This is PROGRESS NOTE    [name]  [id number] DOB: 11-Jan-1961 DOA: 05/15/2021 PCP: Boyce Medici, FNP   Chief Complain:Nausea .vomiting   Brief Narrative:  [name] is a 60 y.o. 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 Presented with ongoing nausea and vomiting. Was recently admitted  for persistent nausea vomiting and constipation discharged on 05/14/2021 with resolution of symptoms. During patient's last hospitalization he had a CT of the abdomen pelvis which was negative and a head CT without acute findings.  Patient had been taking Zofran at his facility but it was not helping. During his recent admission was noted to have coffee-ground emesis felt to be secondary to Mallory-Weiss tear ,GI consulted who recommended holding off on EGD.  Initially nausea and vomiting resolved although it has resumed once patient went back to nursing home facility.    GI consulted and he underwent EGD on 05/26/21. NG tube was placed for feeding purpose but now has been removed. GI recommended IR guided G tube placement and he underwent G-tube placement on 05/28/2021 but apparently is not working and patient had several episodes of vomiting on 05/29/2021.  We have requested IR for evaluation of the G-tube, IR  converted the G-tube to [place] tube   Assessment & Plan:   Active Problems:   Hyperlipidemia associated with type 2 diabetes mellitus (HCC)   Hypokalemia   Schizophrenia ([place])   History of CVA with residual deficit   Chronic combined systolic (congestive) and diastolic (congestive) heart failure (HCC)   Benign essential HTN   Intractable nausea and vomiting   Intractable vomiting with nausea, unspecified vomiting type  Intractable nausea and vomiting secondary to dysphagia vs mechanical obstruction, POA -GI  consulted and he underwent EGD on 05/26/21.  NG tube was advanced in the duodenum but he coughed  it out.GI recommended IR guided G tube placement. - he underwent G-tube placement on 05/28/2021. -He did not tolerate the tube feeds on 05/29/2021 and had several episodes of vomiting - IR converted the  G-tube to [place] tube.  After the procedure, he was still vomiting, abdomen was distended.  IR and GI reconsulted.  Getting CT abdomen/pelvis. -Continue IV fluids for now.  Put the G-tube on suction for now, until CT is done  AKI: Most likely secondary to volume depletion from vomiting.  Improving with IV fluids.  Hypokalemia: Being supplemented with potassium.   History of multiple CVAs with residual right-sided weakness-holding Plavix per IR, he is also now on a PPI   Essential hypertension-continue home medications  .  Severely hypertensive again today.  Continue to use as needed medications until we can use the feeding tube safely  Hyperlipidemia-continue statin  History of schizophrenia-continue home medications  Chronic combined CHF-appears euvolemic, continue home medications   Type 2 diabetes mellitus-continue current regimen, monitor blood sugars  Constipation-resolved with bowel regimen, continue bowel regimen        Nutrition Problem: Severe Malnutrition Etiology: chronic illness (dysphagia)      DVT prophylaxis:SCD Code Status: Full Family Communication: Called and discussed with daughter on phone on 05/28/21,called again on 05/30/21, call not received Status is: Inpatient  Remains inpatient appropriate because:Unsafe d/c plan  Dispo: The patient is from: SNF              Anticipated d/c is to: SNF  Patient currently is not medically stable to d/c.   Difficult to place patient No Can be discharged to skilled nursing facility after he is able to tolerate the tube feeding  Consultants: GI,IR  Procedures:EGD  Antimicrobials:  Anti-infectives (From  admission, onward)    Start     Dose/Rate Route Frequency Ordered Stop   05/28/21 0000  ceFAZolin (ANCEF) IVPB 2g/100 mL premix        2 g 200 mL/hr over 30 Minutes Intravenous To Radiology 05/24/21 1637 05/28/21 1341       Subjective:  Patient seen and examined at the bedside this morning.  He was severely hypertensive when I evaluated him.  This morning, he was not vomiting but his abdomen was a little distended and he was complaining of some abdominal discomfort.  He was waiting for IR evaluation.  Objective: Vitals:   05/30/21 2037 05/30/21 2300 05/31/21 0400 05/31/21 0742  BP: (!) 150/92 (!) 161/105 (!) 166/91 (!) 187/93  Pulse: 74 73 75 84  Resp: '18 16 18 15  '$ Temp: 97.7 F (36.5 C) 98.5 F (36.9 C) 98.7 F (37.1 C) 98.2 F (36.8 C)  TempSrc: Axillary Axillary Axillary Oral  SpO2: 99% 100% 99% 99%  Weight:      Height:        Intake/Output Summary (Last 24 hours) at 05/31/2021 0753 Last data filed at 05/31/2021 0532 Gross per 24 hour  Intake 1338.12 ml  Output 950 ml  Net 388.12 ml     Filed Weights   05/27/21 0423 05/29/21 0744 05/30/21 0500  Weight: 80.6 kg 81 kg 84.9 kg    Examination:  General exam: Overall comfortable, not in distress, very deconditioned HEENT: PERRL Respiratory system:  no wheezes or crackles  Cardiovascular system: S1 & S2 heard, RRR.  Gastrointestinal system: Abdomen is mildly distended, soft and nontender.Gtube Central nervous system: Alert and oriented, right hemiplegia Extremities: No edema, no clubbing ,no cyanosis Skin: No rashes, no ulcers,no icterus          Data Reviewed: I have personally reviewed following labs and imaging studies  CBC: Recent Labs  Lab 05/25/21 0554 05/27/21 0457 05/29/21 0426 05/31/21 0149  WBC 9.3 9.1 11.4* 8.9  NEUTROABS  --   --   --  6.2  HGB 12.1* 12.9* 12.3* 11.0*  HCT 38.5* 39.4 37.9* 34.7*  MCV 86.5 84.2 84.2 85.7  PLT 222 224 237 [id number]   Basic Metabolic Panel: Recent Labs  Lab  05/26/21 0758 05/27/21 0456 05/27/21 0457 05/28/21 1029 05/30/21 0140 05/31/21 0149  NA 139  --  140 139 139 140  K 3.1*  --  3.0* 3.2* 3.1* 3.3*  CL 101  --  100 101 101 105  CO2 27  --  '26 27 27 26  '$ GLUCOSE 109*  --  123* 122* 122* 107*  BUN 27*  --  25* 23* 37* 33*  CREATININE 0.99  --  0.98 1.11 2.10* 1.49*  CALCIUM 9.2  --  9.8 9.5 9.2 8.9  MG  --  2.0  --   --   --   --    GFR: Estimated Creatinine Clearance: 57.6 mL/min (A) (by C-G formula based on SCr of 1.49 mg/dL (H)). Liver Function Tests: No results for input(s): AST, ALT, ALKPHOS, BILITOT, PROT, ALBUMIN in the last 168 hours. No results for input(s): LIPASE, AMYLASE in the last 168 hours. No results for input(s): AMMONIA in the last 168 hours. Coagulation Profile: Recent Labs  Lab 05/25/21 0554  INR 1.1   Cardiac Enzymes: No results for input(s): CKTOTAL, CKMB, CKMBINDEX, TROPONINI in the last 168 hours. BNP (last 3 results) No results for input(s): PROBNP in the last 8760 hours. HbA1C: No results for input(s): HGBA1C in the last 72 hours. CBG: Recent Labs  Lab 05/30/21 1541 05/30/21 2036 05/31/21 0030 05/31/21 0403 05/31/21 0741  GLUCAP 107* 104* 95 99 106*   Lipid Profile: No results for input(s): CHOL, HDL, LDLCALC, TRIG, CHOLHDL, LDLDIRECT in the last 72 hours. Thyroid Function Tests: No results for input(s): TSH, T4TOTAL, FREET4, T3FREE, THYROIDAB in the last 72 hours. Anemia Panel: No results for input(s): VITAMINB12, FOLATE, FERRITIN, TIBC, IRON, RETICCTPCT in the last 72 hours. Sepsis Labs: No results for input(s): PROCALCITON, LATICACIDVEN in the last 168 hours.  No results found for this or any previous visit (from the past 240 hour(s)).       Radiology Studies: No results found.      Scheduled Meds:  amLODipine  10 mg Per Tube q morning   atorvastatin  40 mg Per Tube q1800   carvedilol  25 mg Per Tube BID WC   feeding supplement (PROSource TF)  30 mL Per Tube BID    FLUoxetine  10 mg Per Tube Daily   free water  200 mL Per Tube Q4H   glycopyrrolate  1 mg Per Tube BID   insulin aspart  0-9 Units Subcutaneous Q4H   metoCLOPramide  10 mg Per Tube Q8H   multivitamin with minerals  1 tablet Per Tube Daily   neomycin-bacitracin-polymyxin  1 application Topical Daily   paliperidone  6 mg Oral q morning   pantoprazole sodium  40 mg Per Tube Daily   polyethylene glycol  17 g Per Tube Daily   senna  2 tablet Per Tube QHS   Continuous Infusions:  sodium chloride 10 mL/hr at 05/28/21 1600   sodium chloride 125 mL/hr at 05/31/21 0056   feeding supplement (OSMOLITE 1.5 CAL) Stopped (05/29/21 1601)   potassium chloride       LOS: 15 days    Time spent: 25 mins.More than 50% of that time was spent in counseling and/or coordination of care.      Shelly Coss, MD [facility] P7/14/2022, 7:53 AM  h

## 2021-05-31 NOTE — Procedures (Signed)
Pre procedural Dx: Dysphagia Post procedural Dx: Same  Successful fluoroscopic guided conversion of existing G-tube to a GJ tube. Both lumens are ready for immediate use.  EBL: None Complications: None immediate.  Ronny Bacon, MD Pager #: [phone]

## 2021-05-31 NOTE — Progress Notes (Signed)
Patient back to room 2W13. Patient has gastro tube to abdomen with G and J port. VS checked. Bed kept in low position and locked. Call bell in reach.

## 2021-06-01 DIAGNOSIS — R112 Nausea with vomiting, unspecified: Secondary | ICD-10-CM | POA: Diagnosis not present

## 2021-06-01 DIAGNOSIS — Z515 Encounter for palliative care: Secondary | ICD-10-CM | POA: Diagnosis not present

## 2021-06-01 LAB — BASIC METABOLIC PANEL
Anion gap: 8 (ref 5–15)
BUN: 12 mg/dL (ref 6–20)
CO2: 24 mmol/L (ref 22–32)
Calcium: 9 mg/dL (ref 8.9–10.3)
Chloride: 102 mmol/L (ref 98–111)
Creatinine, Ser: 0.86 mg/dL (ref 0.61–1.24)
GFR, Estimated: >60 mL/min (ref >60)
Glucose, Bld: 170 mg/dL — ABNORMAL HIGH (ref 70–99)
Potassium: 3 mmol/L — ABNORMAL LOW (ref 3.5–5.1)
Sodium: 134 mmol/L — ABNORMAL LOW (ref 135–145)

## 2021-06-01 LAB — GLUCOSE, CAPILLARY
Glucose-Capillary: 123 mg/dL — ABNORMAL HIGH (ref 70–99)
Glucose-Capillary: 139 mg/dL — ABNORMAL HIGH (ref 70–99)
Glucose-Capillary: 160 mg/dL — ABNORMAL HIGH (ref 70–99)
Glucose-Capillary: 168 mg/dL — ABNORMAL HIGH (ref 70–99)
Glucose-Capillary: 174 mg/dL — ABNORMAL HIGH (ref 70–99)
Glucose-Capillary: 180 mg/dL — ABNORMAL HIGH (ref 70–99)

## 2021-06-01 LAB — MAGNESIUM: Magnesium: 1.6 mg/dL — ABNORMAL LOW (ref 1.7–2.4)

## 2021-06-01 MED ORDER — AMLODIPINE BESYLATE 10 MG PO TABS
10.0000 mg | ORAL_TABLET | Freq: Every morning | ORAL | Status: DC
  Administered 2021-06-02 – 2021-06-03 (×2): 10 mg
  Filled 2021-06-01 (×2): qty 1

## 2021-06-01 MED ORDER — LOSARTAN POTASSIUM 50 MG PO TABS
75.0000 mg | ORAL_TABLET | Freq: Every day | ORAL | Status: DC
  Administered 2021-06-01 – 2021-06-03 (×3): 75 mg
  Filled 2021-06-01 (×4): qty 1

## 2021-06-01 MED ORDER — CHLORTHALIDONE 50 MG PO TABS
50.0000 mg | ORAL_TABLET | Freq: Every day | ORAL | Status: DC
  Administered 2021-06-01 – 2021-06-03 (×3): 50 mg
  Filled 2021-06-01 (×3): qty 1

## 2021-06-01 MED ORDER — ASPIRIN 81 MG PO CHEW
81.0000 mg | CHEWABLE_TABLET | Freq: Every day | ORAL | Status: DC
  Administered 2021-06-01 – 2021-06-03 (×3): 81 mg
  Filled 2021-06-01 (×3): qty 1

## 2021-06-01 MED ORDER — CLOPIDOGREL BISULFATE 75 MG PO TABS
75.0000 mg | ORAL_TABLET | Freq: Every morning | ORAL | Status: DC
  Administered 2021-06-01 – 2021-06-03 (×3): 75 mg
  Filled 2021-06-01 (×3): qty 1

## 2021-06-01 MED ORDER — POTASSIUM CHLORIDE 10 MEQ/100ML IV SOLN
10.0000 meq | INTRAVENOUS | Status: AC
  Administered 2021-06-01 (×6): 10 meq via INTRAVENOUS
  Filled 2021-06-01 (×3): qty 100

## 2021-06-01 MED ORDER — CARVEDILOL 25 MG PO TABS
25.0000 mg | ORAL_TABLET | Freq: Two times a day (BID) | ORAL | Status: DC
  Administered 2021-06-01 – 2021-06-03 (×4): 25 mg
  Filled 2021-06-01 (×4): qty 1

## 2021-06-01 MED ORDER — ASPIRIN EC 81 MG PO TBEC: 81.0000 mg | DELAYED_RELEASE_TABLET | Freq: Every morning | ORAL | Status: DC

## 2021-06-01 MED ORDER — LABETALOL HCL 5 MG/ML IV SOLN: 20.0000 mg | Freq: Once | INTRAVENOUS | Status: DC

## 2021-06-01 NOTE — Progress Notes (Signed)
This is PROGRESS NOTE    [name]  [id number] DOB: 25-Feb-1961 DOA: 05/15/2021 PCP: Boyce Medici, FNP   Chief Complain:Nausea .vomiting   Brief Narrative:  [name] is a 60 y.o. 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 Presented with ongoing nausea and vomiting. Was recently admitted  for persistent nausea vomiting and constipation discharged on 05/14/2021 with resolution of symptoms. During patient's last hospitalization he had a CT of the abdomen pelvis which was negative and a head CT without acute findings.  Patient had been taking Zofran at his facility but it was not helping. During his recent admission was noted to have coffee-ground emesis felt to be secondary to Mallory-Weiss tear ,GI consulted who recommended holding off on EGD.  Initially nausea and vomiting resolved although it has resumed once patient went back to nursing home facility.    GI consulted and he underwent EGD on 05/26/21. NG tube was placed for feeding purpose but now has been removed. GI recommended IR guided G tube placement and he underwent G-tube placement on 05/28/2021 but apparently patient did not tolerate the feeding and patient had several episodes of vomiting on 05/29/2021.  We  requested IR for reevaluation of the G-tube, IR  converted the G-tube to [place] tube.  Currently he is tolerating the tube diet, plan for discharge to skilled nursing facility tomorrow.   Assessment & Plan:   Active Problems:   Hyperlipidemia associated with type 2 diabetes mellitus (HCC)   Hypokalemia   Schizophrenia ([place])   History of CVA with residual deficit   Chronic combined systolic (congestive) and diastolic (congestive) heart failure (HCC)   Benign essential HTN   Intractable nausea and vomiting   Intractable vomiting with nausea,  unspecified vomiting type  Intractable nausea and vomiting secondary to dysphagia vs mechanical obstruction, POA -GI consulted and he underwent EGD on 05/26/21.  NG tube was advanced in the duodenum but he coughed  it out.GI recommended IR guided G tube placement. - he underwent G-tube placement on 05/28/2021. -He did not tolerate the tube feeds on 05/29/2021 and had several episodes of vomiting - IR converted the  G-tube to [place] tube.  Currently he is tolerating the tube diet. - CT abdomen/pelvis did not show bowel pathology.  Hypokalemia: Being supplemented with potassium.   History of multiple CVAs with residual right-sided weakness- Plavix to be continued  Essential hypertension-continue home medications  .  Continue home oral antihypertensives, as needed medications for severe hypertension  Hyperlipidemia-continue statin  History of schizophrenia-continue home medications  Chronic combined CHF-appears euvolemic, continue home medications   Type 2 diabetes mellitus-continue current regimen, monitor blood sugars  Constipation-resolved with bowel regimen, continue bowel regimen        Nutrition Problem: Severe Malnutrition Etiology: chronic illness (dysphagia)      DVT prophylaxis:SCD Code Status: Full Family Communication: Called and discussed with daughter on phone on 05/28/21,called again on 06/01/21, call not received Status is: Inpatient  Remains inpatient appropriate because:Unsafe d/c plan  Dispo: The patient is from: SNF              Anticipated d/c is to: SNF              Patient currently is not medically stable to d/c.   Difficult to place patient No Can be discharged to skilled nursing facility  tomorrow Consultants: GI,IR  Procedures:EGD  Antimicrobials:  Anti-infectives (From admission, onward)    Start     Dose/Rate Route Frequency Ordered Stop   05/28/21 0000  ceFAZolin (ANCEF) IVPB 2g/100 mL premix        2 g 200 mL/hr over 30 Minutes Intravenous  To Radiology 05/24/21 1637 05/28/21 1341       Subjective:  Patient seen and examined the bedside this morning.  He is tolerating tube diet today, no nausea or vomiting.  Blood pressure is still high this morning  Objective: Vitals:   06/01/21 0800 06/01/21 0801 06/01/21 0913 06/01/21 1200  BP:  (!) 177/108 (!) 155/92 (!) 159/97  Pulse:    67  Resp:    (!) 8  Temp: 98.1 F (36.7 C)   98.3 F (36.8 C)  TempSrc: Oral   Oral  SpO2:    100%  Weight:      Height:        Intake/Output Summary (Last 24 hours) at 06/01/2021 1321 Last data filed at 05/31/2021 1729 Gross per 24 hour  Intake 1890.4 ml  Output 1300 ml  Net 590.4 ml     Filed Weights   05/27/21 0423 05/29/21 0744 05/30/21 0500  Weight: 80.6 kg 81 kg 84.9 kg    Examination:  General exam: Overall comfortable, not in distress, very deconditioned HEENT: PERRL Respiratory system:  no wheezes or crackles  Cardiovascular system: S1 & S2 heard, RRR.  Gastrointestinal system: Abdomen is nondistended, soft and nontender.G-tube Central nervous system: Alert and awake, hemiparesis right side Extremities: No edema, no clubbing ,no cyanosis Skin: No rashes, no ulcers,no icterus         Data Reviewed: I have personally reviewed following labs and imaging studies  CBC: Recent Labs  Lab 05/27/21 0457 05/29/21 0426 05/31/21 0149  WBC 9.1 11.4* 8.9  NEUTROABS  --   --  6.2  HGB 12.9* 12.3* 11.0*  HCT 39.4 37.9* 34.7*  MCV 84.2 84.2 85.7  PLT 224 237 [id number]   Basic Metabolic Panel: Recent Labs  Lab 05/27/21 0456 05/27/21 0457 05/28/21 1029 05/30/21 0140 05/31/21 0149 06/01/21 0612  NA  --  140 139 139 140 134*  K  --  3.0* 3.2* 3.1* 3.3* 3.0*  CL  --  100 101 101 105 102  CO2  --  '26 27 27 26 24  '$ GLUCOSE  --  123* 122* 122* 107* 170*  BUN  --  25* 23* 37* 33* 12  CREATININE  --  0.98 1.11 2.10* 1.49* 0.86  CALCIUM  --  9.8 9.5 9.2 8.9 9.0  MG 2.0  --   --   --   --  1.6*   GFR: Estimated Creatinine  Clearance: 99.7 mL/min (by C-G formula based on SCr of 0.86 mg/dL). Liver Function Tests: No results for input(s): AST, ALT, ALKPHOS, BILITOT, PROT, ALBUMIN in the last 168 hours. No results for input(s): LIPASE, AMYLASE in the last 168 hours. No results for input(s): AMMONIA in the last 168 hours. Coagulation Profile: No results for input(s): INR, PROTIME in the last 168 hours.  Cardiac Enzymes: No results for input(s): CKTOTAL, CKMB, CKMBINDEX, TROPONINI in the last 168 hours. BNP (last 3 results) No results for input(s): PROBNP in the last 8760 hours. HbA1C: No results for input(s): HGBA1C in the last 72 hours. CBG: Recent Labs  Lab 05/31/21 2030 06/01/21 0016 06/01/21 0406 06/01/21 0757 06/01/21 1111  GLUCAP 103* 123* 160* 180* 139*   Lipid Profile: No results for input(s):  CHOL, HDL, LDLCALC, TRIG, CHOLHDL, LDLDIRECT in the last 72 hours. Thyroid Function Tests: No results for input(s): TSH, T4TOTAL, FREET4, T3FREE, THYROIDAB in the last 72 hours. Anemia Panel: No results for input(s): VITAMINB12, FOLATE, FERRITIN, TIBC, IRON, RETICCTPCT in the last 72 hours. Sepsis Labs: No results for input(s): PROCALCITON, LATICACIDVEN in the last 168 hours.  No results found for this or any previous visit (from the past 240 hour(s)).       Radiology Studies: CT ABDOMEN PELVIS W CONTRAST  Result Date: 05/31/2021 CLINICAL DATA:  60 year old male with abdominal distension. EXAM: CT ABDOMEN AND PELVIS WITH CONTRAST TECHNIQUE: Multidetector CT imaging of the abdomen and pelvis was performed using the standard protocol following bolus administration of intravenous contrast. CONTRAST:  168m OMNIPAQUE IOHEXOL 300 MG/ML  SOLN COMPARISON:  CT abdomen pelvis dated 05/12/2021. FINDINGS: Lower chest: Right lung base linear and streaky atelectasis. Developing infiltrate is not excluded clinical correlation is recommended. No intra-abdominal free air or free fluid. Hepatobiliary: No focal liver  abnormality is seen. No gallstones, gallbladder wall thickening, or biliary dilatation. Pancreas: Unremarkable. No pancreatic ductal dilatation or surrounding inflammatory changes. Spleen: Normal in size without focal abnormality. Adrenals/Urinary Tract: The adrenal glands are unremarkable. There is no hydronephrosis on either side. There is symmetric enhancement and excretion of contrast by both kidneys. The visualized ureters appear unremarkable. There is a 4 mm high attenuating focus along the posterior bladder wall close to the right ureterovesical junction (79/3 and coronal 65/6) which may represent a focus of bladder wall calcification or a recently passed stone or less likely a nonobstructing right UVJ calculus. The urinary bladder is otherwise unremarkable. Stomach/Bowel: Percutaneous gastrojejunostomy with balloon in the body of the stomach and tip in the proximal jejunum. There is no bowel obstruction or active inflammation. The appendix is normal. Vascular/Lymphatic: Mild aortoiliac atherosclerotic disease. The IVC is unremarkable. No portal venous gas. There is no adenopathy. Reproductive: The prostate and seminal vesicles are grossly unremarkable. No pelvic mass. Other: None Musculoskeletal: No acute or significant osseous findings. IMPRESSION: 1. A 4 mm high attenuating focus along the posterior bladder wall may represent a focus of bladder wall calcification or a recently passed stone or less likely a nonobstructing right UVJ calculus. No hydronephrosis. 2. No bowel obstruction. Normal appendix. 3. Aortic Atherosclerosis (ICD10-I70.0). Electronically Signed   By: AAnner CreteM.D.   On: 05/31/2021 20:29   IR GASTR TUBE CONVERT GASTR-JEJ PER W/FL MOD SED  Result Date: 05/31/2021 INDICATION: Post percutaneous gastrostomy tube placement on 05/28/2021, now with request to convert the existing gastrostomy tube to a gastrojejunostomy tube for post pyloric feedings. EXAM: FLUOROSCOPIC GUIDED  CONVERSION OF GASTROSTOMY TO GASTROJEJUNOSTOMY TUBE. COMPARISON:  Image guided gastrostomy tube placement-05/28/2021 MEDICATIONS: None. CONTRAST:  60 cc Omnipaque 300, administered into the stomach and proximal small bowel. FLUOROSCOPY TIME:  3 minutes, 18 seconds (6.5 mGy) COMPLICATIONS: None. PROCEDURE: The upper abdomen and the external portion of the existing gastrostomy tube was prepped and draped in the usual sterile fashion, and a sterile drape was applied covering the operative field. Maximum barrier sterile technique with sterile gowns and gloves were used for the procedure. A timeout was performed prior to the initiation of the procedure. The existing gastrostomy tube was injected with a small amount of contrast demonstrating appropriate positioning and functionality. Next, the external portion of the gastrostomy tube was slightly trimmed and then cannulated with a Kumpe catheter. With the use of a stiff Glidewire, the Kumpe catheter was advanced through the gastric  antrum to the level of the proximal small bowel. Contrast injection confirmed appropriate positioning. Next, under intermittent fluoroscopic guidance, the Kumpe catheter was exchanged for a 9-French coaxial gastrojejunostomy catheter which was advanced through the pull-through gastrostomy tube with tip ultimately terminating within the proximal small bowel. Contrast injection via the jejunostomy and gastric lumens confirmed appropriate functioning and positioning. A dressing was applied. The patient tolerated procedure well without immediate postprocedural complication. IMPRESSION: Successful fluoroscopic guided conversion of existing pull-through gastrostomy tube to a gastrojejunostomy catheter via 9 French coaxial extension with the tip of the jejunostomy lumen within the proximal jejunum. Both lumens are ready for immediate use. Electronically Signed   By: Sandi Mariscal M.D.   On: 05/31/2021 17:27        Scheduled Meds:  amLODipine  10  mg Per Tube q morning   atorvastatin  40 mg Per Tube q1800   carvedilol  25 mg Per Tube BID WC   feeding supplement (PROSource TF)  30 mL Per Tube BID   FLUoxetine  10 mg Per Tube Daily   free water  200 mL Per Tube Q4H   glycopyrrolate  1 mg Per Tube BID   insulin aspart  0-9 Units Subcutaneous Q4H   labetalol  20 mg Intravenous Once   metoCLOPramide  10 mg Per Tube Q8H   multivitamin with minerals  1 tablet Per Tube Daily   neomycin-bacitracin-polymyxin  1 application Topical Daily   paliperidone  6 mg Oral q morning   pantoprazole sodium  40 mg Per Tube Daily   polyethylene glycol  17 g Per Tube Daily   senna  2 tablet Per Tube QHS   Continuous Infusions:  sodium chloride 10 mL/hr at 05/28/21 1600   feeding supplement (OSMOLITE 1.5 CAL) 50 mL/hr at 06/01/21 1202   potassium chloride 10 mEq (06/01/21 1114)     LOS: 16 days    Time spent: 25 mins.More than 50% of that time was spent in counseling and/or coordination of care.      Shelly Coss, MD [facility] P7/15/2022, 1:21 PM  h

## 2021-06-01 NOTE — Progress Notes (Signed)
[facility])  Referral received for outpatient palliative care services.  ACC will continue to follow and begin services once he is discharged.  Venia Carbon RN, BSN, [place] [facility] Liaison

## 2021-06-01 NOTE — Progress Notes (Signed)
Physical Therapy Discharge Patient Details Name: [name] MRN: [id number] DOB: [birth year] Today's Date: 06/01/2021 Time:  -     Patient discharged from PT services secondary to patient has made no progress toward goals in a reasonable time frame. Pt has been vomiting and unable to work with pt.  Also, based on PT evaluation, pt was not very mobile at baseline and was lifted to chair PTA. Have placed 4 calls to [facility] to try and confirm pts mobility at SNF and cannot get anyone that can provide information. Spoke with pts' current nurse this am and she states that pt is total care for bed mobility and all other care. Will sign off as PT does not appear to be appropriate for this pt.   Please see latest therapy progress note for current level of functioning and progress toward goals.    Progress and discharge plan discussed with patient and/or caregiver: Patient/Caregiver agrees with plan  GP     [name] 06/01/2021, 12:12 PM  Leondre Taul M,PT Acute Rehab Services [phone] (pager)

## 2021-06-01 NOTE — TOC Progression Note (Addendum)
Transition of Care [facility]) - Progression Note    Patient Details  Name: [name] MRN: [id number] Date of Birth: Mar 30, 1961  Transition of Care [facility]) CM/SW Contact  Joanne Chars, LCSW Phone Number: 06/01/2021, 1:29 PM  Clinical Narrative:   Pt doing better with tube feeds, per MD, should be ready for DC tomorrow.  CSW spoke with Narda Rutherford at [facility] and they can accept pt back tomorrow.  Narda Rutherford will be the contact over the weekend.  CSW attempted to contact pt daughter, no answer, voicemail full.  Text message sent.    1445: Spoke to Daughter Katharine Look by phone, updated her, discussed outpt palliative and she is interested in [facility]  CSW spoke with Venia Carbon at [facility] and made referral.      Expected Discharge Plan: [place] Barriers to Discharge: Continued Medical Work up, SNF Pending bed offer  Expected Discharge Plan and Services Expected Discharge Plan: [place] Choice: [place] arrangements for the past 2 months: [place]                                       Social Determinants of Health (SDOH) Interventions    Readmission Risk Interventions No flowsheet data found.

## 2021-06-01 NOTE — Plan of Care (Signed)
  Problem: Education: Goal: Knowledge of General Education information will improve Description Including pain rating scale, medication(s)/side effects and non-pharmacologic comfort measures Outcome: Progressing   

## 2021-06-01 NOTE — Progress Notes (Addendum)
   Palliative Medicine Inpatient Follow Up Note  Reason for consult:  Goals of Care   HPI:  Per intake H&P --> 60 y/o male with prior hx of EtOH abuse, cocaine abuse, DM2, malignant HTN, schizophrenia, chronic combined CHF, multiple prior strokes with persistent dysarthria and SNF residence at this time he was most recently seen in the hospital and discharged on 05/14/2021 after having persistent nausea vomiting and constipation.   Palliative care was consulted for goals of care conversations in the setting of two recent inpatient admissions and multiple co-morbidities.   Today's Discussion (06/01/2021):  *Please note that this is a verbal dictation therefore any spelling or grammatical errors are due to the "[place] One" system interpretation.  Chart reviewed. Gerald Stabs remains to have e/o vomiting though there has been some gradual overall improvement. He has had a GJ-Tube placed and is advancing slowly on gtt rate. Patient has not been advancing with PT/OT - they now plan to sign off.    Have called patients daughter, Katharine Look to readdress goals of care. She states that she will come in tonight after work around [employer] and has agreed to meet with me then. I shared that the conversations with entail discussion of code status and asked that she review the "Hard Choices for [place]" book I had emailed to her.   Per secure chat with MSW, Marya Amsler plan for transition back to [facility] tomorrow  Questions and concerns addressed  ___________________________________________________ Addendum:  Martin Majestic to meet with patients daughter, Katharine Look at [facility] and waited x15 minutes. She did not show. A copy of the MOST form was left at bedside. I obtained the prior one which again confirms full scope of treatment.   Objective Assessment: Vital Signs Vitals:   06/01/21 0913 06/01/21 1200  BP: (!) 155/92 (!) 159/97  Pulse:  67  Resp:  (!) 8  Temp:  98.3 F (36.8 C)  SpO2:  100%    Intake/Output  Summary (Last 24 hours) at 06/01/2021 1304 Last data filed at 05/31/2021 1729 Gross per 24 hour  Intake 1890.4 ml  Output 1300 ml  Net 590.4 ml    Last Weight  Most recent update: 05/30/2021  6:32 AM    Weight  84.9 kg (187 lb 2.7 oz)            Gen: Middle-aged African-American ill appearing male in no acute distress HEENT: Dry mucous membranes CV: Regular rate and rhythm PULM: On room air ABD: GJ tube in place EXT: LUE weakness Neuro: Alert to self, disoriented to place and situation  SUMMARY OF RECOMMENDATIONS   Full Code / Full Scope of Care  GJ-Tube now in place   HCPOA documents in [place]  Will transition back to [facility] as early as tomorrow   Ongoing incremental palliative team support  Time Spent: 25 Greater than 50% of the time was spent in counseling and coordination of care ______________________________________________________________________________________ [place] Team Team Cell Phone: [phone] Please utilize secure chat with additional questions, if there is no response within 30 minutes please call the above phone number  Palliative Medicine Team providers are available by phone from 7am to 7pm daily and can be reached through the team cell phone.  Should this patient require assistance outside of these hours, please call the patient's attending physician.

## 2021-06-02 DIAGNOSIS — Z515 Encounter for palliative care: Secondary | ICD-10-CM | POA: Diagnosis not present

## 2021-06-02 DIAGNOSIS — R112 Nausea with vomiting, unspecified: Secondary | ICD-10-CM | POA: Diagnosis not present

## 2021-06-02 LAB — GLUCOSE, CAPILLARY
Glucose-Capillary: 112 mg/dL — ABNORMAL HIGH (ref 70–99)
Glucose-Capillary: 124 mg/dL — ABNORMAL HIGH (ref 70–99)
Glucose-Capillary: 133 mg/dL — ABNORMAL HIGH (ref 70–99)
Glucose-Capillary: 142 mg/dL — ABNORMAL HIGH (ref 70–99)
Glucose-Capillary: 146 mg/dL — ABNORMAL HIGH (ref 70–99)
Glucose-Capillary: 186 mg/dL — ABNORMAL HIGH (ref 70–99)

## 2021-06-02 LAB — BASIC METABOLIC PANEL
Anion gap: 9 (ref 5–15)
BUN: 6 mg/dL (ref 6–20)
CO2: 26 mmol/L (ref 22–32)
Calcium: 9.3 mg/dL (ref 8.9–10.3)
Chloride: 102 mmol/L (ref 98–111)
Creatinine, Ser: 0.85 mg/dL (ref 0.61–1.24)
GFR, Estimated: >60 mL/min (ref >60)
Glucose, Bld: 127 mg/dL — ABNORMAL HIGH (ref 70–99)
Potassium: 3.1 mmol/L — ABNORMAL LOW (ref 3.5–5.1)
Sodium: 137 mmol/L (ref 135–145)

## 2021-06-02 LAB — PHOSPHORUS
Phosphorus: 2.2 mg/dL — ABNORMAL LOW (ref 2.5–4.6)
Phosphorus: 3.9 mg/dL (ref 2.5–4.6)

## 2021-06-02 LAB — RESP PANEL BY RT-PCR (FLU A&B, COVID) ARPGX2
Influenza A by PCR: NEGATIVE
Influenza B by PCR: NEGATIVE
SARS Coronavirus 2 by RT PCR: NEGATIVE

## 2021-06-02 LAB — MAGNESIUM: Magnesium: 2.8 mg/dL — ABNORMAL HIGH (ref 1.7–2.4)

## 2021-06-02 MED ORDER — METOCLOPRAMIDE HCL 5 MG/5ML PO SOLN
10.0000 mg | Freq: Four times a day (QID) | ORAL | Status: DC | PRN
  Filled 2021-06-02: qty 10

## 2021-06-02 MED ORDER — METOPROLOL TARTRATE 5 MG/5ML IV SOLN
5.0000 mg | Freq: Once | INTRAVENOUS | Status: AC
  Administered 2021-06-02: 5 mg via INTRAVENOUS
  Filled 2021-06-02: qty 5

## 2021-06-02 MED ORDER — POTASSIUM CHLORIDE 20 MEQ PO PACK
60.0000 meq | PACK | Freq: Once | ORAL | Status: AC
  Administered 2021-06-02: 60 meq
  Filled 2021-06-02: qty 3

## 2021-06-02 NOTE — TOC Progression Note (Signed)
Transition of Care [facility]) - Progression Note    Patient Details  Name: [name] MRN: [id number] Date of Birth: March 03, 1961  Transition of Care [facility]) CM/SW [place], [place] Phone Number: 06/02/2021, 11:36 AM  Clinical Narrative:    CSW spoke with facility who noted tube feed supplies will likely be delivered this afternoon. MD requested DC tomorrow and facility agreeable to this. Plan to DC in am, SW will continue to follow for DC needs.   Expected Discharge Plan: [place] Barriers to Discharge: Continued Medical Work up, SNF Pending bed offer  Expected Discharge Plan and Services Expected Discharge Plan: [place] Choice: [place] arrangements for the past 2 months: [place]                                       Social Determinants of Health (SDOH) Interventions    Readmission Risk Interventions No flowsheet data found.

## 2021-06-02 NOTE — Progress Notes (Signed)
MD orders received to consult dietician about vomiting and TF rate.  Dietician recommended starting TF back at lower rate and to gradually increase back to goal rate. Verbal orders received to put in nursing message with orders to change TF.

## 2021-06-02 NOTE — Progress Notes (Signed)
   Palliative Medicine Inpatient Follow Up Note  Reason for consult:  Goals of Care   HPI:  Per intake H&P --> 60 y/o male with prior hx of EtOH abuse, cocaine abuse, DM2, malignant HTN, schizophrenia, chronic combined CHF, multiple prior strokes with persistent dysarthria and SNF residence at this time he was most recently seen in the hospital and discharged on 05/14/2021 after having persistent nausea vomiting and constipation.   Palliative care was consulted for goals of care conversations in the setting of two recent inpatient admissions and multiple co-morbidities.   Today's Discussion (06/02/2021):  *Please note that this is a verbal dictation therefore any spelling or grammatical errors are due to the "[place] One" system interpretation.  Chart reviewed. Overnight nursing had difficulty with control of hypertension - still hypertensive as of around 4AM though better comparably.   This morning [name] is sleeping though arousal to voice and aware of self. Last emesis episode was on 7/14. No indicators of pain are present.   Plan will be for transition to [facility] with OP Palliative care to continue goals of care conversations.  Objective Assessment: Vital Signs Vitals:   06/02/21 0324 06/02/21 0345  BP: (!) 207/124 (!) 168/94  Pulse:    Resp:    Temp:  98 F (36.7 C)  SpO2:  98%    Intake/Output Summary (Last 24 hours) at 06/02/2021 0734 Last data filed at 06/02/2021 0349 Gross per 24 hour  Intake 2656.1 ml  Output 2202 ml  Net 454.1 ml    Last Weight  Most recent update: 06/02/2021  3:47 AM    Weight  84.9 kg (187 lb 2.7 oz)            Gen: Middle-aged African-American ill appearing male in no acute distress HEENT: Dry mucous membranes CV: Regular rate and rhythm PULM: On room air ABD: GJ tube in place EXT: LUE weakness Neuro: Alert to self  SUMMARY OF RECOMMENDATIONS   Full Code / Full Scope of Care - MOST reflects this and will be scanned into  [employer]  GJ-Tube now in place   HCPOA documents in [place]  Will transition back to [facility] with OP Palliative support through [facility]   Ongoing incremental palliative team support  Time Spent: 15 Greater than 50% of the time was spent in counseling and coordination of care ______________________________________________________________________________________ [place] Team Team Cell Phone: [phone] Please utilize secure chat with additional questions, if there is no response within 30 minutes please call the above phone number  Palliative Medicine Team providers are available by phone from 7am to 7pm daily and can be reached through the team cell phone.  Should this patient require assistance outside of these hours, please call the patient's attending physician.

## 2021-06-02 NOTE — Consult Note (Signed)
[place] Nurse Consult Note: Reason for Consult: Patient with history of great toe paronychia (ingrown toenails).  Has been seen by podiatric medicine at the [facility]. Last visit was in February 2022. Wound type: Ingrown toenails, history of fungal toenails Pressure Injury POA: N/A Measurement:N/A Wound bed:N/A Drainage (amount, consistency, odor) None Periwound: N/A Dressing procedure/placement/frequency: POC implemented by Podiatry has been for daily soap and water cleanse and thorough drying between toes. A betadine swabstick is used to promote an antimicrobial environment; it is applied daily and allowed to air-dry. Clean sock application daily with attention to well-fitting footwear is advised.  [place] nursing team will not follow, but will remain available to this patient, the nursing and medical teams.  Please re-consult if needed. Thanks, Maudie Flakes, MSN, RN, [place], Arther Abbott  Pager# [phone]

## 2021-06-02 NOTE — Progress Notes (Addendum)
Brief Nutrition Follow Up  Patient planned for discharge to [facility] today. Patient underwent IR conversion of G-tube to [place] tube on 7/14. Tube feeding was restarted the same day via the J-port. Per RN, patient experienced a vomiting episode last night while tube feeding was running at 50 ml/hr. Tube feeding was held and MD was notified. RN unsure if tube feeding was running through the G port or the J port as there has been no documentation.  Osmolite 1.5 was restarted at 50 ml/hr via the J-port around 9 am this morning. Recommend continued advancement of 10 ml Q4 hours to goal rate of 65 ml/hr. If vomiting persists, recommend venting G port and obtaining KUB. Recommend delaying discharge until patient demonstrates tolerance of goal rate feeding as he has yet to do so since admit.   Tube feeding: -Osmolite 1.5 @ 50 ml/hr via J-port  -Advance 10 ml Q4 hours to goal rate of 28m/hr (15659m -4553mrosource TF BID -200m44mee water Q4H   At goal rate TF provides 2420 kcals, 120g protein, 1189ml41me water (2389ml 12ml free water with flushes)  Monitor magnesium, potassium, and phosphorus daily for at least 3 days, MD to replete as needed, as pt is at risk for refeeding syndrome. Potassium low this am (3.1), will need to check phosphorus prior to d/c.   Desera Graffeo Mariana SingleD, LDN, CNSC Clinical Nutrition Pager listed in [place]

## 2021-06-02 NOTE — Progress Notes (Signed)
RN found patient vomiting moderate amount of yellow bile. MD notified and TF paused.  MD gave orders to vent the g tube to LIS.

## 2021-06-02 NOTE — Progress Notes (Signed)
This is PROGRESS NOTE    [name]  [id number] DOB: February 27, 1961 DOA: 05/15/2021 PCP: Boyce Medici, FNP   Chief Complain:Nausea .vomiting   Brief Narrative:  [name] is a 60 y.o. 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 Presented with ongoing nausea and vomiting. Was recently admitted  for persistent nausea vomiting and constipation discharged on 05/14/2021 with resolution of symptoms. During patient's last hospitalization he had a CT of the abdomen pelvis which was negative and a head CT without acute findings.  Patient had been taking Zofran at his facility but it was not helping. During his recent admission was noted to have coffee-ground emesis felt to be secondary to Mallory-Weiss tear ,GI consulted who recommended holding off on EGD.  Initially nausea and vomiting resolved although it has resumed once patient went back to nursing home facility.    GI consulted and he underwent EGD on 05/26/21. NG tube was placed for feeding purpose but now has been removed. GI recommended IR guided G tube placement and he underwent G-tube placement on 05/28/2021 but apparently patient did not tolerate the feeding and patient had several episodes of vomiting on 05/29/2021.  We  requested IR for reevaluation of the G-tube, IR  converted the G-tube to [place] tube. Plan for discharge to skilled nursing facility tomorrow if he continues to tolerate tube diet, his skilled nursing facility is also waiting for delivery of the feed supplies.   Assessment & Plan:   Active Problems:   Hyperlipidemia associated with type 2 diabetes mellitus (HCC)   Hypokalemia   Schizophrenia ([place])   History of CVA with residual deficit   Chronic combined systolic (congestive) and diastolic (congestive) heart failure (HCC)   Benign essential  HTN   Intractable nausea and vomiting   Intractable vomiting with nausea, unspecified vomiting type  Intractable nausea and vomiting secondary to dysphagia vs mechanical obstruction, POA -GI consulted and he underwent EGD on 05/26/21.  NG tube was advanced in the duodenum but he coughed  it out.GI recommended IR guided G tube placement. - he underwent G-tube placement on 05/28/2021. -He did not tolerate the tube feeds on 05/29/2021 and had several episodes of vomiting - IR converted the  G-tube to [place] tube.  Currently he is tolerating the tube diet. - CT abdomen/pelvis did not show bowel pathology. -Dietitian closely following.  Plan is to monitor daily magnesium, phosphorus and potassium due to concern for refeeding syndrome.  Hypokalemia: Being supplemented with potassium.  Checking magnesium and phosphorus   History of multiple CVAs with residual right-sided weakness-aspirin/ Plavix to be continued  Essential hypertension-continue home medications  .  Continue home oral antihypertensives, as needed medications for severe hypertension  Hyperlipidemia-continue statin  History of schizophrenia-continue home medications  Chronic combined CHF-appears euvolemic, continue home medications   Type 2 diabetes mellitus-continue current regimen, monitor blood sugars  Constipation-resolved with bowel regimen, continue bowel regimen   Debility/deconditioning/goals of care: Palliative care was consulted for goals of care discussion, multiple comorbidities.  Palliative care recommended outpatient palliative care follow-up.  Remains full code       Nutrition Problem: Severe Malnutrition Etiology: chronic illness (dysphagia)      DVT prophylaxis:SCD Code Status: Full Family Communication: Called and discussed with daughter on phone on 05/28/21,called again on 06/01/21, call not received Status is: Inpatient  Remains inpatient appropriate because:Unsafe d/c plan  Dispo:  The patient is from:  SNF              Anticipated d/c is to: SNF              Patient currently is not medically stable to d/c.   Difficult to place patient No Can be discharged to skilled nursing facility  tomorrow if he tolerates tube feed Consultants: GI,IR  Procedures:EGD  Antimicrobials:  Anti-infectives (From admission, onward)    Start     Dose/Rate Route Frequency Ordered Stop   05/28/21 0000  ceFAZolin (ANCEF) IVPB 2g/100 mL premix        2 g 200 mL/hr over 30 Minutes Intravenous To Radiology 05/24/21 1637 05/28/21 1341       Subjective:  Patient seen and examined the bedside this morning.  Hypertensive, had not received any oral meds this morning.  Appears comfortable, not vomiting or nauseous.  Abdomen not distended.  Objective: Vitals:   06/02/21 0324 06/02/21 0345 06/02/21 0750 06/02/21 1000  BP: (!) 207/124 (!) 168/94 (!) 177/116 (!) 160/110  Pulse:   100   Resp:   20   Temp:  98 F (36.7 C) 98.6 F (37 C)   TempSrc:  Oral Oral   SpO2:  98% 100%   Weight:  84.9 kg    Height:        Intake/Output Summary (Last 24 hours) at 06/02/2021 1200 Last data filed at 06/02/2021 0349 Gross per 24 hour  Intake 2656.1 ml  Output 2202 ml  Net 454.1 ml     Filed Weights   05/29/21 0744 05/30/21 0500 06/02/21 0345  Weight: 81 kg 84.9 kg 84.9 kg    Examination:  General exam: Overall comfortable, not in distress, extremely deconditioned HEENT: PERRL Respiratory system:  no wheezes or crackles  Cardiovascular system: S1 & S2 heard, RRR.  Gastrointestinal system: Abdomen is nondistended, soft and nontender.GJ tube Central nervous system: Alert and awake, bedbound, right hemiparesis  extremities: No edema, no clubbing ,no cyanosis Skin: No rashes, no ulcers,no icterus      Data Reviewed: I have personally reviewed following labs and imaging studies  CBC: Recent Labs  Lab 05/27/21 0457 05/29/21 0426 05/31/21 0149  WBC 9.1 11.4* 8.9  NEUTROABS  --   --  6.2  HGB 12.9* 12.3*  11.0*  HCT 39.4 37.9* 34.7*  MCV 84.2 84.2 85.7  PLT 224 237 [id number]   Basic Metabolic Panel: Recent Labs  Lab 05/27/21 0456 05/27/21 0457 05/28/21 1029 05/30/21 0140 05/31/21 0149 06/01/21 0612 06/02/21 0240  NA  --    < > 139 139 140 134* 137  K  --    < > 3.2* 3.1* 3.3* 3.0* 3.1*  CL  --    < > 101 101 105 102 102  CO2  --    < > '27 27 26 24 26  '$ GLUCOSE  --    < > 122* 122* 107* 170* 127*  BUN  --    < > 23* 37* 33* 12 6  CREATININE  --    < > 1.11 2.10* 1.49* 0.86 0.85  CALCIUM  --    < > 9.5 9.2 8.9 9.0 9.3  MG 2.0  --   --   --   --  1.6*  --    < > = values in this interval not displayed.   GFR: Estimated Creatinine Clearance: 100.9 mL/min (by C-G formula based on SCr of 0.85 mg/dL). Liver Function Tests: No results  for input(s): AST, ALT, ALKPHOS, BILITOT, PROT, ALBUMIN in the last 168 hours. No results for input(s): LIPASE, AMYLASE in the last 168 hours. No results for input(s): AMMONIA in the last 168 hours. Coagulation Profile: No results for input(s): INR, PROTIME in the last 168 hours.  Cardiac Enzymes: No results for input(s): CKTOTAL, CKMB, CKMBINDEX, TROPONINI in the last 168 hours. BNP (last 3 results) No results for input(s): PROBNP in the last 8760 hours. HbA1C: No results for input(s): HGBA1C in the last 72 hours. CBG: Recent Labs  Lab 06/01/21 1940 06/01/21 2359 06/02/21 0342 06/02/21 0746 06/02/21 1127  GLUCAP 174* 112* 146* 133* 186*   Lipid Profile: No results for input(s): CHOL, HDL, LDLCALC, TRIG, CHOLHDL, LDLDIRECT in the last 72 hours. Thyroid Function Tests: No results for input(s): TSH, T4TOTAL, FREET4, T3FREE, THYROIDAB in the last 72 hours. Anemia Panel: No results for input(s): VITAMINB12, FOLATE, FERRITIN, TIBC, IRON, RETICCTPCT in the last 72 hours. Sepsis Labs: No results for input(s): PROCALCITON, LATICACIDVEN in the last 168 hours.  No results found for this or any previous visit (from the past 240 hour(s)).        Radiology Studies: CT ABDOMEN PELVIS W CONTRAST  Result Date: 05/31/2021 CLINICAL DATA:  60 year old male with abdominal distension. EXAM: CT ABDOMEN AND PELVIS WITH CONTRAST TECHNIQUE: Multidetector CT imaging of the abdomen and pelvis was performed using the standard protocol following bolus administration of intravenous contrast. CONTRAST:  135m OMNIPAQUE IOHEXOL 300 MG/ML  SOLN COMPARISON:  CT abdomen pelvis dated 05/12/2021. FINDINGS: Lower chest: Right lung base linear and streaky atelectasis. Developing infiltrate is not excluded clinical correlation is recommended. No intra-abdominal free air or free fluid. Hepatobiliary: No focal liver abnormality is seen. No gallstones, gallbladder wall thickening, or biliary dilatation. Pancreas: Unremarkable. No pancreatic ductal dilatation or surrounding inflammatory changes. Spleen: Normal in size without focal abnormality. Adrenals/Urinary Tract: The adrenal glands are unremarkable. There is no hydronephrosis on either side. There is symmetric enhancement and excretion of contrast by both kidneys. The visualized ureters appear unremarkable. There is a 4 mm high attenuating focus along the posterior bladder wall close to the right ureterovesical junction (79/3 and coronal 65/6) which may represent a focus of bladder wall calcification or a recently passed stone or less likely a nonobstructing right UVJ calculus. The urinary bladder is otherwise unremarkable. Stomach/Bowel: Percutaneous gastrojejunostomy with balloon in the body of the stomach and tip in the proximal jejunum. There is no bowel obstruction or active inflammation. The appendix is normal. Vascular/Lymphatic: Mild aortoiliac atherosclerotic disease. The IVC is unremarkable. No portal venous gas. There is no adenopathy. Reproductive: The prostate and seminal vesicles are grossly unremarkable. No pelvic mass. Other: None Musculoskeletal: No acute or significant osseous findings. IMPRESSION: 1.  A 4 mm high attenuating focus along the posterior bladder wall may represent a focus of bladder wall calcification or a recently passed stone or less likely a nonobstructing right UVJ calculus. No hydronephrosis. 2. No bowel obstruction. Normal appendix. 3. Aortic Atherosclerosis (ICD10-I70.0). Electronically Signed   By: AAnner CreteM.D.   On: 05/31/2021 20:29   IR GASTR TUBE CONVERT GASTR-JEJ PER W/FL MOD SED  Result Date: 05/31/2021 INDICATION: Post percutaneous gastrostomy tube placement on 05/28/2021, now with request to convert the existing gastrostomy tube to a gastrojejunostomy tube for post pyloric feedings. EXAM: FLUOROSCOPIC GUIDED CONVERSION OF GASTROSTOMY TO GASTROJEJUNOSTOMY TUBE. COMPARISON:  Image guided gastrostomy tube placement-05/28/2021 MEDICATIONS: None. CONTRAST:  60 cc Omnipaque 300, administered into the stomach and proximal small bowel.  FLUOROSCOPY TIME:  3 minutes, 18 seconds (6.5 mGy) COMPLICATIONS: None. PROCEDURE: The upper abdomen and the external portion of the existing gastrostomy tube was prepped and draped in the usual sterile fashion, and a sterile drape was applied covering the operative field. Maximum barrier sterile technique with sterile gowns and gloves were used for the procedure. A timeout was performed prior to the initiation of the procedure. The existing gastrostomy tube was injected with a small amount of contrast demonstrating appropriate positioning and functionality. Next, the external portion of the gastrostomy tube was slightly trimmed and then cannulated with a Kumpe catheter. With the use of a stiff Glidewire, the Kumpe catheter was advanced through the gastric antrum to the level of the proximal small bowel. Contrast injection confirmed appropriate positioning. Next, under intermittent fluoroscopic guidance, the Kumpe catheter was exchanged for a 9-French coaxial gastrojejunostomy catheter which was advanced through the pull-through gastrostomy tube  with tip ultimately terminating within the proximal small bowel. Contrast injection via the jejunostomy and gastric lumens confirmed appropriate functioning and positioning. A dressing was applied. The patient tolerated procedure well without immediate postprocedural complication. IMPRESSION: Successful fluoroscopic guided conversion of existing pull-through gastrostomy tube to a gastrojejunostomy catheter via 9 French coaxial extension with the tip of the jejunostomy lumen within the proximal jejunum. Both lumens are ready for immediate use. Electronically Signed   By: Sandi Mariscal M.D.   On: 05/31/2021 17:27        Scheduled Meds:  amLODipine  10 mg Per Tube q morning   aspirin  81 mg Per Tube Daily   atorvastatin  40 mg Per Tube q1800   carvedilol  25 mg Per Tube BID WC   chlorthalidone  50 mg Per Tube Daily   clopidogrel  75 mg Per Tube q morning   feeding supplement (PROSource TF)  30 mL Per Tube BID   FLUoxetine  10 mg Per Tube Daily   free water  200 mL Per Tube Q4H   glycopyrrolate  1 mg Per Tube BID   insulin aspart  0-9 Units Subcutaneous Q4H   losartan  75 mg Per Tube Daily   multivitamin with minerals  1 tablet Per Tube Daily   neomycin-bacitracin-polymyxin  1 application Topical Daily   paliperidone  6 mg Oral q morning   pantoprazole sodium  40 mg Per Tube Daily   polyethylene glycol  17 g Per Tube Daily   senna  2 tablet Per Tube QHS   Continuous Infusions:  sodium chloride 10 mL/hr at 05/28/21 1600   feeding supplement (OSMOLITE 1.5 CAL) 60 mL/hr at 06/02/21 1118     LOS: 17 days    Time spent: 25 mins.More than 50% of that time was spent in counseling and/or coordination of care.      Shelly Coss, MD [facility] P7/16/2022, 12:00 PM  h

## 2021-06-02 NOTE — Progress Notes (Signed)
B/P outside of parameters @ 188/100, Hydralazine 10 mg IVP given and Dr Tonie Griffith was notified.  1227 Dr Tonie Griffith was updated on pt blood pressure-174/112, new order for Lopressor '5mg'$  ordered- same to be given.  0324 B/P  outside of parameters @ 207/124,Dr Chotiner was notified and said to give the next dose of Hydralazine now - same was done. Will cont to monitor.

## 2021-06-03 DIAGNOSIS — R112 Nausea with vomiting, unspecified: Secondary | ICD-10-CM | POA: Diagnosis not present

## 2021-06-03 LAB — CBC WITH DIFFERENTIAL/PLATELET
Abs Immature Granulocytes: 0.03 10*3/uL (ref 0.00–0.07)
Basophils Absolute: 0 10*3/uL (ref 0.0–0.1)
Basophils Relative: 1 %
Eosinophils Absolute: 0.2 10*3/uL (ref 0.0–0.5)
Eosinophils Relative: 3 %
HCT: 37.4 % — ABNORMAL LOW (ref 39.0–52.0)
Hemoglobin: 12 g/dL — ABNORMAL LOW (ref 13.0–17.0)
Immature Granulocytes: 0 %
Lymphocytes Relative: 23 %
Lymphs Abs: 1.6 10*3/uL (ref 0.7–4.0)
MCH: 27.1 pg (ref 26.0–34.0)
MCHC: 32.1 g/dL (ref 30.0–36.0)
MCV: 84.4 fL (ref 80.0–100.0)
Monocytes Absolute: 0.7 10*3/uL (ref 0.1–1.0)
Monocytes Relative: 10 %
Neutro Abs: 4.4 10*3/uL (ref 1.7–7.7)
Neutrophils Relative %: 63 %
Platelets: 245 10*3/uL (ref 150–400)
RBC: 4.43 MIL/uL (ref 4.22–5.81)
RDW: 13.4 % (ref 11.5–15.5)
WBC: 7 10*3/uL (ref 4.0–10.5)
nRBC: 0 % (ref 0.0–0.2)

## 2021-06-03 LAB — MAGNESIUM: Magnesium: 1.6 mg/dL — ABNORMAL LOW (ref 1.7–2.4)

## 2021-06-03 LAB — GLUCOSE, CAPILLARY
Glucose-Capillary: 150 mg/dL — ABNORMAL HIGH (ref 70–99)
Glucose-Capillary: 157 mg/dL — ABNORMAL HIGH (ref 70–99)
Glucose-Capillary: 165 mg/dL — ABNORMAL HIGH (ref 70–99)

## 2021-06-03 LAB — BASIC METABOLIC PANEL
Anion gap: 5 (ref 5–15)
BUN: 8 mg/dL (ref 6–20)
CO2: 29 mmol/L (ref 22–32)
Calcium: 9 mg/dL (ref 8.9–10.3)
Chloride: 104 mmol/L (ref 98–111)
Creatinine, Ser: 1.03 mg/dL (ref 0.61–1.24)
GFR, Estimated: >60 mL/min (ref >60)
Glucose, Bld: 167 mg/dL — ABNORMAL HIGH (ref 70–99)
Potassium: 3.1 mmol/L — ABNORMAL LOW (ref 3.5–5.1)
Sodium: 138 mmol/L (ref 135–145)

## 2021-06-03 LAB — PHOSPHORUS: Phosphorus: 2.5 mg/dL (ref 2.5–4.6)

## 2021-06-03 MED ORDER — POTASSIUM CHLORIDE 20 MEQ PO PACK: 40.0000 meq | PACK | Freq: Every day | ORAL | 0 refills | Status: DC

## 2021-06-03 MED ORDER — OSMOLITE 1.5 CAL PO LIQD: 1000.0000 mL | ORAL | 0 refills | Status: DC

## 2021-06-03 MED ORDER — POTASSIUM CHLORIDE 20 MEQ PO PACK
60.0000 meq | PACK | Freq: Once | ORAL | Status: AC
  Administered 2021-06-03: 60 meq
  Filled 2021-06-03: qty 3

## 2021-06-03 MED ORDER — MAGNESIUM SULFATE 2 GM/50ML IV SOLN
2.0000 g | Freq: Once | INTRAVENOUS | Status: AC
  Administered 2021-06-03: 2 g via INTRAVENOUS
  Filled 2021-06-03: qty 50

## 2021-06-03 MED ORDER — PROSOURCE TF PO LIQD: 30.0000 mL | Freq: Two times a day (BID) | ORAL | Status: DC

## 2021-06-03 MED ORDER — METOCLOPRAMIDE HCL 5 MG/5ML PO SOLN: 10.0000 mg | Freq: Four times a day (QID) | ORAL | 0 refills | Status: DC | PRN

## 2021-06-03 MED ORDER — ONDANSETRON 4 MG PO TBDP: 4.0000 mg | ORAL_TABLET | Freq: Four times a day (QID) | ORAL | 0 refills | Status: AC

## 2021-06-03 NOTE — TOC Transition Note (Signed)
Transition of Care [facility]) - CM/SW Discharge Note   Patient Details  Name: Chirag Daigneault MRN: [id number] Date of Birth: [birth year]  Transition of Care [facility]) CM/SW Contact:  Coralee Pesa, [place] Phone Number: 06/03/2021, 10:02 AM   Clinical Narrative:    Pt to be transported by [facility] to [facility] nursing facility.  Nurse to call report to [phone].   Final next level of care: [place] Barriers to Discharge: Barriers Resolved   Patient Goals and CMS Choice        Discharge Placement              Patient chooses bed at: [facility] Patient to be transferred to facility by: [facility] Name of family member notified: [name], Unable to leave VM, sent text requesting call back. Patient and family notified of of transfer: 06/03/21  Discharge Plan and Services     Post Acute Care Choice: [facility]                               Social Determinants of Health (SDOH) Interventions     Readmission Risk Interventions No flowsheet data found.

## 2021-06-03 NOTE — Discharge Summary (Signed)
Physician Discharge Summary  [name] [id number] DOB: [birth year] DOA: 05/15/2021  PCP: Boyce Medici, FNP  Admit date: 05/15/2021 Discharge date: 06/03/2021  Admitted From:SNF Disposition:  SNF  Discharge Condition:Stable CODE STATUS:FULL, Diet recommendation: Tube feeding   Brief/Interim Summary: [name] is a 60 y.o. 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 Presented with ongoing nausea and vomiting. Was recently admitted  for persistent nausea vomiting and constipation discharged on 05/14/2021 with resolution of symptoms. During patient's last hospitalization he had a CT of the abdomen pelvis which was negative and a head CT without acute findings.  Patient had been taking Zofran at his facility but it was not helping. During his recent admission was noted to have coffee-ground emesis felt to be secondary to Mallory-Weiss tear ,GI consulted who recommended holding off on EGD.  Initially nausea and vomiting resolved although it has resumed once patient went back to nursing home facility.    GI consulted and he underwent EGD on 05/26/21. NG tube was placed for feeding purpose but now has been removed. GI recommended IR guided G tube placement and he underwent G-tube placement on 05/28/2021 but apparently patient did not tolerate the feeding and patient had several episodes of vomiting on 05/29/2021.  We  requested IR for reevaluation of the G-tube, IR  converted the G-tube to [place] tube.  Feeding started through tube.  Plan for discharge to skilled nursing facility today.  Following problems were addressed during his hospitalization:  Intractable nausea and vomiting secondary to dysphagia vs mechanical obstruction, POA -GI consulted and he underwent EGD on 05/26/21.  NG tube was advanced in the duodenum but  he coughed  it out.GI recommended IR guided G tube placement. - he underwent G-tube placement on 05/28/2021. -He did not tolerate the tube feeds on 05/29/2021 and had several episodes of vomiting - IR converted the  G-tube to [place] tube.  Currently he is tolerating the tube diet. - CT abdomen/pelvis did not show bowel pathology. -Meds to be given via tube   Hypokalemia: Being supplemented with potassium.  Checking magnesium and potassium level in a week   History of multiple CVAs with residual right-sided weakness-aspirin/ Plavix to be continued   Essential hypertension-continue home medications  .    Hyperlipidemia-continue statin  History of schizophrenia-continue home medications  Chronic combined CHF-appears euvolemic, continue home medications    Type 2 diabetes mellitus-continue home regimen   Constipation-resolved with bowel regimen, continue bowel regimen    Debility/deconditioning/goals of care: Palliative care was consulted for goals of care discussion, multiple comorbidities.  Palliative care recommended outpatient palliative care follow-up.  Remains full code    Discharge Diagnoses:  Active Problems:   Hyperlipidemia associated with type 2 diabetes mellitus (HCC)   Hypokalemia   Schizophrenia (HCC)   History of CVA with residual deficit   Chronic combined systolic (congestive) and diastolic (congestive) heart failure (HCC)   Benign essential HTN   Intractable nausea and vomiting   Intractable vomiting with nausea, unspecified vomiting type    Discharge Instructions  Discharge Instructions     Diet general   Complete by: As directed    Tube feeding diet.   Discharge instructions   Complete by: As directed    1)Please take your medications via newly placed G tube 2)Monitor your blood pressure 3)Check Magnesium and BMP test in a week   Discharge  wound care:   Complete by: As directed    As per wound care   Increase activity slowly   Complete by: As  directed       Allergies as of 06/03/2021   No Known Allergies      Medication List     STOP taking these medications    ondansetron 4 MG tablet Commonly known as: ZOFRAN       TAKE these medications    amLODipine 10 MG tablet Commonly known as: NORVASC Take 1 tablet (10 mg total) by mouth daily. What changed: when to take this   aspirin EC 81 MG tablet Take 81 mg by mouth every morning. Swallow whole.   atorvastatin 40 MG tablet Commonly known as: LIPITOR Take 1 tablet (40 mg total) by mouth daily. What changed: when to take this   bacitracin ointment Apply 1 application topically 2 (two) times daily.   bisacodyl 10 MG suppository Commonly known as: DULCOLAX Place 10 mg rectally daily as needed for mild constipation.   carvedilol 25 MG tablet Commonly known as: COREG Take 1 tablet (25 mg total) by mouth 2 (two) times daily with a meal. What changed: when to take this   chlorthalidone 50 MG tablet Commonly known as: HYGROTON Take 50 mg by mouth every morning.   clopidogrel 75 MG tablet Commonly known as: PLAVIX Take 1 tablet (75 mg total) by mouth daily. What changed: when to take this   docusate sodium 100 MG capsule Commonly known as: COLACE Take 100 mg by mouth 2 (two) times daily.   feeding supplement (OSMOLITE 1.5 CAL) Liqd Place 1,000 mLs into feeding tube continuous.   feeding supplement (PROSource TF) liquid Place 30 mLs into feeding tube 2 (two) times daily.   FLUoxetine 10 MG capsule Commonly known as: PROZAC Take 1 capsule (10 mg total) by mouth daily. What changed: when to take this   losartan 25 MG tablet Commonly known as: COZAAR Take 75 mg by mouth every morning.   metFORMIN 500 MG tablet Commonly known as: GLUCOPHAGE Take 1 tablet (500 mg total) by mouth 2 (two) times daily with a meal.   metoCLOPramide 5 MG/5ML solution Commonly known as: REGLAN Place 10 mLs (10 mg total) into feeding tube every 6 (six) hours as needed  for up to 7 days for nausea.   ondansetron 4 MG disintegrating tablet Commonly known as: ZOFRAN-ODT Take 1 tablet (4 mg total) by mouth every 6 (six) hours. What changed: Another medication with the same name was removed. Continue taking this medication, and follow the directions you see here.   paliperidone 6 MG 24 hr tablet Commonly known as: INVEGA Take 6 mg by mouth every morning.   pantoprazole 40 MG tablet Commonly known as: Protonix Take 1 tablet (40 mg total) by mouth 2 (two) times daily before a meal.   polyethylene glycol 17 g packet Commonly known as: MIRALAX / GLYCOLAX Take 17 g by mouth 2 (two) times daily. Mix in 8 oz liquid and drink   potassium chloride 20 MEQ packet Commonly known as: Klor-Con Take 40 mEq by mouth daily for 5 days. Start taking on: June 04, 2021   senna 8.6 MG Tabs tablet Commonly known as: SENOKOT Take 2 tablets by mouth at bedtime.       ASK your doctor about these medications    dextrose 5 % and 0.9% NaCl 5-0.9 % Inject into the vein See admin instructions. Administer IV fluids PIV at 75 ml/hr for  1 liter,then stop               Discharge Care Instructions  (From admission, onward)           Start     Ordered   06/03/21 0000  Discharge wound care:       Comments: As per wound care   06/03/21 1159            Follow-up Information     [facility] Follow up.   Why: Someone from [place] will contact you to discuss outpatient palliative services. Contact information: [place] [phone]               No Known Allergies  Consultations: IR.GI   Procedures/Studies: DG Chest 2 View  Result Date: 05/12/2021 CLINICAL DATA:  Vomiting.  Rule out aspiration. EXAM: CHEST - 2 VIEW COMPARISON:  Chest x-ray 05/09/2020 CT abdomen pelvis 05/12/2021, chest x-ray 05/09/2020 FINDINGS: The heart size and mediastinal contours are within normal limits. No focal  consolidation. No pulmonary edema. No pleural effusion. No pneumothorax. No acute osseous abnormality. Degenerative changes of the right shoulder with widening of the acromioclavicular joint. Query trace hiatal hernia. IMPRESSION: 1. No active cardiopulmonary disease. 2. Query trace hiatal hernia. Electronically Signed   By: Iven Finn M.D.   On: 05/12/2021 20:50   CT Head Wo Contrast  Result Date: 05/12/2021 CLINICAL DATA:  Headache.  History of strokes. EXAM: CT HEAD WITHOUT CONTRAST TECHNIQUE: Contiguous axial images were obtained from the base of the skull through the vertex without intravenous contrast. COMPARISON:  05/12/2020 FINDINGS: Brain: There is no evidence of an acute infarct, intracranial hemorrhage, mass, midline shift, or extra-axial fluid collection. Patchy hypodensities in the cerebral white matter bilaterally have not significantly changed and are nonspecific but compatible with moderately age advanced chronic small vessel ischemic disease. A chronic lacunar infarct is again noted in the left basal ganglia. There is chronic dystrophic calcification medially in the left cerebellar hemisphere. New hypoattenuation lateral to the calcification in the left cerebellar hemisphere extending into the middle cerebellar peduncle is compatible with encephalomalacia/an interval chronic infarct. A small focus of cortical encephalomalacia laterally in the left temporal lobe is unchanged. There is mild cerebral and cerebellar atrophy. Vascular: Calcified atherosclerosis at the skull base. Skull: No fracture or suspicious osseous lesion. Sinuses/Orbits: Visualized paranasal sinuses and mastoid air cells are clear. Unremarkable orbits. Other: None. IMPRESSION: 1. No evidence of acute intracranial abnormality. 2. Age advanced chronic small vessel ischemia with multiple chronic infarcts as above including an interval left cerebellar infarct. Electronically Signed   By: Logan Bores M.D.   On: 05/12/2021  20:13   CT ABDOMEN PELVIS W CONTRAST  Result Date: 05/31/2021 CLINICAL DATA:  60 year old male with abdominal distension. EXAM: CT ABDOMEN AND PELVIS WITH CONTRAST TECHNIQUE: Multidetector CT imaging of the abdomen and pelvis was performed using the standard protocol following bolus administration of intravenous contrast. CONTRAST:  17m OMNIPAQUE IOHEXOL 300 MG/ML  SOLN COMPARISON:  CT abdomen pelvis dated 05/12/2021. FINDINGS: Lower chest: Right lung base linear and streaky atelectasis. Developing infiltrate is not excluded clinical correlation is recommended. No intra-abdominal free air or free fluid. Hepatobiliary: No focal liver abnormality is seen. No gallstones, gallbladder wall thickening, or biliary dilatation. Pancreas: Unremarkable. No pancreatic ductal dilatation or surrounding inflammatory changes. Spleen: Normal in size without focal abnormality. Adrenals/Urinary Tract: The adrenal glands are unremarkable. There is no hydronephrosis on either side. There is symmetric enhancement and excretion  of contrast by both kidneys. The visualized ureters appear unremarkable. There is a 4 mm high attenuating focus along the posterior bladder wall close to the right ureterovesical junction (79/3 and coronal 65/6) which may represent a focus of bladder wall calcification or a recently passed stone or less likely a nonobstructing right UVJ calculus. The urinary bladder is otherwise unremarkable. Stomach/Bowel: Percutaneous gastrojejunostomy with balloon in the body of the stomach and tip in the proximal jejunum. There is no bowel obstruction or active inflammation. The appendix is normal. Vascular/Lymphatic: Mild aortoiliac atherosclerotic disease. The IVC is unremarkable. No portal venous gas. There is no adenopathy. Reproductive: The prostate and seminal vesicles are grossly unremarkable. No pelvic mass. Other: None Musculoskeletal: No acute or significant osseous findings. IMPRESSION: 1. A 4 mm high  attenuating focus along the posterior bladder wall may represent a focus of bladder wall calcification or a recently passed stone or less likely a nonobstructing right UVJ calculus. No hydronephrosis. 2. No bowel obstruction. Normal appendix. 3. Aortic Atherosclerosis (ICD10-I70.0). Electronically Signed   By: Anner Crete M.D.   On: 05/31/2021 20:29   CT ABDOMEN PELVIS W CONTRAST  Result Date: 05/12/2021 CLINICAL DATA:  Constipation for 1 week. Concern for bowel obstruction. EXAM: CT ABDOMEN AND PELVIS WITH CONTRAST TECHNIQUE: Multidetector CT imaging of the abdomen and pelvis was performed using the standard protocol following bolus administration of intravenous contrast. CONTRAST:  145m OMNIPAQUE IOHEXOL 300 MG/ML  SOLN COMPARISON:  None. FINDINGS: Lower chest: No acute findings. Hepatobiliary: No focal liver abnormality is seen. No gallstones, gallbladder wall thickening, or biliary dilatation. Pancreas: Unremarkable. No pancreatic ductal dilatation or surrounding inflammatory changes. Spleen: Normal in size without focal abnormality. Adrenals/Urinary Tract: No adrenal masses. Kidneys normal in size, orientation and position with symmetric enhancement and excretion. No renal mass, stone or hydronephrosis. Normal ureters. Normal bladder. Stomach/Bowel: Normal stomach. Small bowel and colon are normal in caliber. No wall thickening. No inflammation. Mild to moderate increase in the colonic stool burden. Normal appendix visualized. Vascular/Lymphatic: Mild aortic atherosclerosis. No aneurysm. No enlarged lymph nodes. Reproductive: Unremarkable. Other: No abdominal wall hernia or abnormality. No abdominopelvic ascites. Musculoskeletal: No acute or significant osseous findings. IMPRESSION: 1. No acute findings. No evidence of bowel obstruction or inflammation. 2. Mild to moderate increase in the colonic stool burden. 3. Mild aortic atherosclerosis. Electronically Signed   By: DLajean ManesM.D.   On:  05/12/2021 16:05   IR GASTROSTOMY TUBE MOD SED  Result Date: 05/28/2021 INDICATION: 60year old male referred for image guided gastrostomy EXAM: PERC PLACEMENT GASTROSTOMY MEDICATIONS: 2 g Ancef; Antibiotics were administered within 1 hour of the procedure. ANESTHESIA/SEDATION: Versed 1.5 mg IV; Fentanyl 75 mcg IV Moderate Sedation Time:  10 minutes The patient was continuously monitored during the procedure by the interventional radiology nurse under my direct supervision. CONTRAST:  10 cc-administered into the gastric lumen. FLUOROSCOPY TIME:  Fluoroscopy Time: 1 minutes 6 seconds (8 mGy). COMPLICATIONS: None PROCEDURE: Informed written consent was obtained from the patient and the patient's family after a thorough discussion of the procedural risks, benefits and alternatives. All questions were addressed. Maximal Sterile Barrier Technique was utilized including caps, mask, sterile gowns, sterile gloves, sterile drape, hand hygiene and skin antiseptic. A timeout was performed prior to the initiation of the procedure. The epigastrium was prepped with Betadine in a sterile fashion, and a sterile drape was applied covering the operative field. A sterile gown and sterile gloves were used for the procedure. A 5-French orogastric tube is placed under  fluoroscopic guidance. Scout imaging of the abdomen confirms barium within the transverse colon. The stomach was distended with gas. Under fluoroscopic guidance, an 18 gauge needle was utilized to puncture the anterior wall of the body of the stomach. An Amplatz wire was advanced through the needle passing a T fastener into the lumen of the stomach. The T fastener was secured for gastropexy. A 9-French sheath was inserted. A snare was advanced through the 9-French sheath. A Britta Mccreedy was advanced through the orogastric tube. It was snared then pulled out the oral cavity, pulling the snare, as well. The leading edge of the gastrostomy was attached to the snare. It was then  pulled down the esophagus and out the percutaneous site. Tube secured in place. Contrast was injected. Patient tolerated the procedure well and remained hemodynamically stable throughout. No complications were encountered and no significant blood loss encountered. IMPRESSION: Status post fluoroscopic placed percutaneous gastrostomy tube, with 20 [place] pull-through. Signed, Dulcy Fanny. Earleen Newport, DO [facility] Electronically Signed   By: Corrie Mckusick D.O.   On: 05/28/2021 14:57   IR GASTR TUBE CONVERT GASTR-JEJ PER W/FL MOD SED  Result Date: 05/31/2021 INDICATION: Post percutaneous gastrostomy tube placement on 05/28/2021, now with request to convert the existing gastrostomy tube to a gastrojejunostomy tube for post pyloric feedings. EXAM: FLUOROSCOPIC GUIDED CONVERSION OF GASTROSTOMY TO GASTROJEJUNOSTOMY TUBE. COMPARISON:  Image guided gastrostomy tube placement-05/28/2021 MEDICATIONS: None. CONTRAST:  60 cc Omnipaque 300, administered into the stomach and proximal small bowel. FLUOROSCOPY TIME:  3 minutes, 18 seconds (6.5 mGy) COMPLICATIONS: None. PROCEDURE: The upper abdomen and the external portion of the existing gastrostomy tube was prepped and draped in the usual sterile fashion, and a sterile drape was applied covering the operative field. Maximum barrier sterile technique with sterile gowns and gloves were used for the procedure. A timeout was performed prior to the initiation of the procedure. The existing gastrostomy tube was injected with a small amount of contrast demonstrating appropriate positioning and functionality. Next, the external portion of the gastrostomy tube was slightly trimmed and then cannulated with a Kumpe catheter. With the use of a stiff Glidewire, the Kumpe catheter was advanced through the gastric antrum to the level of the proximal small bowel. Contrast injection confirmed appropriate positioning. Next, under intermittent  fluoroscopic guidance, the Kumpe catheter was exchanged for a 9-French coaxial gastrojejunostomy catheter which was advanced through the pull-through gastrostomy tube with tip ultimately terminating within the proximal small bowel. Contrast injection via the jejunostomy and gastric lumens confirmed appropriate functioning and positioning. A dressing was applied. The patient tolerated procedure well without immediate postprocedural complication. IMPRESSION: Successful fluoroscopic guided conversion of existing pull-through gastrostomy tube to a gastrojejunostomy catheter via 9 French coaxial extension with the tip of the jejunostomy lumen within the proximal jejunum. Both lumens are ready for immediate use. Electronically Signed   By: Sandi Mariscal M.D.   On: 05/31/2021 17:27   DG Abdomen Acute W/Chest  Result Date: 05/15/2021 CLINICAL DATA:  Nausea and vomiting for 2 days. EXAM: DG ABDOMEN ACUTE WITH 1 VIEW CHEST COMPARISON:  None. FINDINGS: There is no evidence of dilated bowel loops or free intraperitoneal air. A moderate amount of stool is seen within the descending colon. No radiopaque calculi or other significant radiographic abnormality is seen. The cardiac silhouette is mildly enlarged and unchanged in size. Both lungs are clear. A chronic deformity is seen involving the distal right clavicle. IMPRESSION: Negative abdominal radiographs.  No acute cardiopulmonary disease.  Electronically Signed   By: Virgina Norfolk M.D.   On: 05/15/2021 16:37   DG Abd Portable 1V  Result Date: 05/23/2021 CLINICAL DATA:  Evaluate feeding tube placement EXAM: PORTABLE ABDOMEN - 1 VIEW COMPARISON:  05/15/21 FINDINGS: The feeding tube tip is well below the level of the GE junction projecting over the expected location of the gastric antrum. Heart size and mediastinal contours appear stable. Lungs are clear. Enteric contrast material identified within the colon at the level of the hepatic flexure. IMPRESSION: Feeding tube tip  projects over the gastric antrum. Electronically Signed   By: Kerby Moors M.D.   On: 05/23/2021 12:27   DG Swallowing Func-Speech Pathology  Result Date: 05/18/2021 Formatting of this result is different from the original. Objective Swallowing Evaluation: Type of Study: MBS-Modified Barium Swallow Study  Patient Details Name: Royel Borstad MRN: [id number] Date of Birth: 06-Oct-1961 Today's Date: 05/18/2021 Time: SLP Start Time (ACUTE ONLY): [id number] -SLP Stop Time (ACUTE ONLY): 1208 SLP Time Calculation (min) (ACUTE ONLY): 23 min Past Medical History: Past Medical History: Diagnosis Date  Alcohol abuse   Arthritis   Crack cocaine use   Depression   Diabetes mellitus   Drug abuse ([place])   DTs (delirium tremens) ([place])   history of   Gout   Noncompliance with medication regimen   Schizophrenia ([place])   Stroke ([place])   Systolic heart failure secondary to hypertension ([place])   Uncontrolled hypertension  Past Surgical History: Past Surgical History: Procedure Laterality Date  LEFT HEART CATH AND CORONARY ANGIOGRAPHY N/A 02/03/2018  Procedure: LEFT HEART CATH AND CORONARY ANGIOGRAPHY;  Surgeon: Jettie Booze, MD;  Location: [place] [facility] [place] [facility];  Service: Cardiovascular;  Laterality: N/A;  MANDIBLE RECONSTRUCTION    TONSILLECTOMY   HPI: Pt is a 60 y.o. male with ongoing nausea and vomiting after recent discharge on 05/14/21. During recent admission pt noted to have coffee-ground emesis felt to be secondary to Mallory-Weiss tear GI consulted but recommended holding off on EGD. Palliative Care: full scope, full code. 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 MBS 04/19/20:  Oropharyngeal dysphagia with left labial weakness, and a pharyngeal delay; regular texture diet and thin liquids recommended  No data recorded Assessment / Plan / Recommendation CHL IP CLINICAL IMPRESSIONS 05/18/2021  Clinical Impression Pt presents with oropharyngeal dysphagia characterize by reduced labial seal, reduced bolus cohesion, reduced posterior bolus propulsion, weak bolus manipulation, prolonged mastication, and a pharyngeal delay. He demonstrated anterior spillage to the right with all liquids, reduced labial stripping, residue in the right alteral sulcus, premature spillage to the valleculae and pyriform sinuses, penetration (PAS 5) and aspiration (PAS 8) with thin liquids. The initial bolus of thin liquids via cup was noted to prematurely spill to the pyriform sinuses and into the larynx. Subsequent instances of aspiration with thin liquids via straw were trace and of penetrated material after deglutition. Coughing was inconsistently noted following aspiration of thin liquids, but was ineffective. A dysphagia 1 diet with nectar thick liquids is recommended at this time and SLP will follow for treatment. SLP Visit Diagnosis Dysphagia, oropharyngeal phase (R13.12) Attention and concentration deficit following -- Frontal lobe and executive function deficit following -- Impact on safety and function Mild aspiration risk;Moderate aspiration risk   CHL IP TREATMENT RECOMMENDATION 05/18/2021 Treatment Recommendations Therapy as outlined in treatment plan below   Prognosis 05/18/2021 Prognosis for Safe Diet Advancement Good Barriers to Reach  Goals Cognitive deficits Barriers/Prognosis Comment -- CHL IP DIET RECOMMENDATION 05/18/2021 SLP Diet Recommendations Dysphagia 1 (Puree) solids;Nectar thick liquid Liquid Administration via Cup;Straw Medication Administration Crushed with puree Compensations Small sips/bites;Slow rate Postural Changes Seated upright at 90 degrees;Remain semi-upright after after feeds/meals (Comment)   CHL IP OTHER RECOMMENDATIONS 05/18/2021 Recommended Consults -- Oral Care Recommendations Oral care BID Other Recommendations --   CHL IP FOLLOW UP RECOMMENDATIONS 05/18/2021 Follow up Recommendations Skilled  Nursing facility   [facility] IP FREQUENCY AND DURATION 05/18/2021 Speech Therapy Frequency (ACUTE ONLY) min 2x/week Treatment Duration 2 weeks      CHL IP ORAL PHASE 05/18/2021 Oral Phase Impaired Oral - Pudding Teaspoon -- Oral - Pudding Cup -- Oral - Honey Teaspoon -- Oral - Honey Cup -- Oral - Nectar Teaspoon -- Oral - Nectar Cup -- Oral - Nectar Straw Right anterior bolus loss;Weak lingual manipulation;Decreased bolus cohesion;Premature spillage Oral - Thin Teaspoon -- Oral - Thin Cup Right anterior bolus loss;Weak lingual manipulation;Decreased bolus cohesion;Premature spillage Oral - Thin Straw Right anterior bolus loss;Weak lingual manipulation;Decreased bolus cohesion;Premature spillage Oral - Puree Weak lingual manipulation;Decreased bolus cohesion;Premature spillage;Right pocketing in lateral sulci Oral - Mech Soft Weak lingual manipulation;Decreased bolus cohesion;Impaired mastication Oral - Regular -- Oral - Multi-Consistency -- Oral - Pill -- Oral Phase - Comment --  CHL IP PHARYNGEAL PHASE 05/18/2021 Pharyngeal Phase Impaired Pharyngeal- Pudding Teaspoon -- Pharyngeal -- Pharyngeal- Pudding Cup -- Pharyngeal -- Pharyngeal- Honey Teaspoon -- Pharyngeal -- Pharyngeal- Honey Cup -- Pharyngeal -- Pharyngeal- Nectar Teaspoon -- Pharyngeal -- Pharyngeal- Nectar Cup -- Pharyngeal -- Pharyngeal- Nectar Straw Delayed swallow initiation-pyriform sinuses;Penetration/Aspiration during swallow;Penetration/Apiration after swallow;Trace aspiration Pharyngeal Material enters airway, remains ABOVE vocal cords and not ejected out;Material enters airway, passes BELOW cords without attempt by patient to eject out (silent aspiration);Material enters airway, passes BELOW cords and not ejected out despite cough attempt by patient Pharyngeal- Thin Teaspoon -- Pharyngeal -- Pharyngeal- Thin Cup Delayed swallow initiation-pyriform sinuses;Penetration/Aspiration during swallow;Penetration/Apiration after swallow;Moderate aspiration  Pharyngeal Material enters airway, passes BELOW cords without attempt by patient to eject out (silent aspiration);Material enters airway, passes BELOW cords and not ejected out despite cough attempt by patient;Material enters airway, CONTACTS cords and not ejected out Pharyngeal- Thin Straw Delayed swallow initiation-pyriform sinuses;Penetration/Aspiration during swallow;Penetration/Apiration after swallow;Trace aspiration Pharyngeal Material enters airway, CONTACTS cords and not ejected out;Material enters airway, passes BELOW cords without attempt by patient to eject out (silent aspiration) Pharyngeal- Puree Delayed swallow initiation-pyriform sinuses;Delayed swallow initiation-vallecula Pharyngeal -- Pharyngeal- Mechanical Soft Delayed swallow initiation-vallecula;Delayed swallow initiation-pyriform sinuses Pharyngeal -- Pharyngeal- Regular -- Pharyngeal -- Pharyngeal- Multi-consistency -- Pharyngeal -- Pharyngeal- Pill -- Pharyngeal -- Pharyngeal Comment --  CHL IP CERVICAL ESOPHAGEAL PHASE 05/18/2021 Cervical Esophageal Phase WFL Pudding Teaspoon -- Pudding Cup -- Honey Teaspoon -- Honey Cup -- Nectar Teaspoon -- Nectar Cup -- Nectar Straw -- Thin Teaspoon -- Thin Cup -- Thin Straw -- Puree -- Mechanical Soft -- Regular -- Multi-consistency -- Pill -- Cervical Esophageal Comment -- Shanika I. Hardin Negus, [place], [place] Office number [phone] Pager [place] 05/18/2021, 3:23 PM                 Subjective:  Patient seen and examined at  the bedside this morning.  Hemodynamically stable for discharge.  Discharge Exam: Vitals:   06/03/21 0412 06/03/21 0822  BP: 113/76 (!) 149/95  Pulse:  94  Resp:  16  Temp: 98.7 F (37.1 C) 98.6 F (37 C)  SpO2:  99%  Vitals:   06/02/21 2011 06/03/21 0047 06/03/21 0412 06/03/21 0822  BP: 123/79 124/87 113/76 (!) 149/95  Pulse:    94  Resp:    16  Temp: 98.2 F (36.8 C) 98.4 F (36.9 C) 98.7 F (37.1 C) 98.6  F (37 C)  TempSrc: Oral Oral Oral Oral  SpO2:    99%  Weight:   87.2 kg   Height:        General: Pt is alert, awake, not in acute distress, very deconditioned, debilitated Cardiovascular: RRR, S1/S2 +, no rubs, no gallops Respiratory: CTA bilaterally, no wheezing, no rhonchi Abdominal: Soft, NT, ND, bowel sounds +, G-tube Extremities: no edema, no cyanosis    The results of significant diagnostics from this hospitalization (including imaging, microbiology, ancillary and laboratory) are listed below for reference.     Microbiology: Recent Results (from the past 240 hour(s))  Resp Panel by RT-PCR (Flu A&B, Covid) Nasopharyngeal Swab     Status: None   Collection Time: 06/02/21  2:12 PM   Specimen: Nasopharyngeal Swab; Nasopharyngeal(NP) swabs in vial transport medium  Result Value Ref Range Status   SARS Coronavirus 2 by RT PCR NEGATIVE NEGATIVE Final    Comment: (NOTE) SARS-CoV-2 target nucleic acids are NOT DETECTED.  The SARS-CoV-2 RNA is generally detectable in upper respiratory specimens during the acute phase of infection. The lowest concentration of SARS-CoV-2 viral copies this assay can detect is 138 copies/mL. A negative result does not preclude SARS-Cov-2 infection and should not be used as the sole basis for treatment or other patient management decisions. A negative result may occur with  improper specimen collection/handling, submission of specimen other than nasopharyngeal swab, presence of viral mutation(s) within the areas targeted by this assay, and inadequate number of viral copies(<138 copies/mL). A negative result must be combined with clinical observations, patient history, and epidemiological information. The expected result is Negative.  Fact Sheet for Patients:  EntrepreneurPulse.com.au  Fact Sheet for Healthcare Providers:  IncredibleEmployment.be  This test is no t yet approved or cleared by the [place]  FDA and  has been authorized for detection and/or diagnosis of SARS-CoV-2 by FDA under an Emergency Use Authorization (EUA). This EUA will remain  in effect (meaning this test can be used) for the duration of the COVID-19 declaration under Section 564(b)(1) of the Act, 21 U.S.C.section 360bbb-3(b)(1), unless the authorization is terminated  or revoked sooner.       Influenza A by PCR NEGATIVE NEGATIVE Final   Influenza B by PCR NEGATIVE NEGATIVE Final    Comment: (NOTE) The Xpert Xpress SARS-CoV-2/FLU/RSV plus assay is intended as an aid in the diagnosis of influenza from Nasopharyngeal swab specimens and should not be used as a sole basis for treatment. Nasal washings and aspirates are unacceptable for Xpert Xpress SARS-CoV-2/FLU/RSV testing.  Fact Sheet for Patients: EntrepreneurPulse.com.au  Fact Sheet for Healthcare Providers: IncredibleEmployment.be  This test is not yet approved or cleared by the [place] FDA and has been authorized for detection and/or diagnosis of SARS-CoV-2 by FDA under an Emergency Use Authorization (EUA). This EUA will remain in effect (meaning this test can be used) for the duration of the COVID-19 declaration under Section 564(b)(1) of the Act, 21 U.S.C. section 360bbb-3(b)(1), unless the authorization is terminated or revoked.  Performed at [facility], [place] [address], [place], [place] [address]      Labs: BNP (last 3 results) No results for input(s): BNP in the last 8760 hours. Basic Metabolic Panel: Recent  Labs  Lab 05/30/21 0140 05/31/21 0149 06/01/21 0612 06/02/21 0240 06/02/21 1645 06/03/21 0409  NA 139 140 134* 137  --  138  K 3.1* 3.3* 3.0* 3.1*  --  3.1*  CL 101 105 102 102  --  104  CO2 '27 26 24 26  '$ --  29  GLUCOSE 122* 107* 170* 127*  --  167*  BUN 37* 33* 12 6  --  8  CREATININE 2.10* 1.49* 0.86 0.85  --  1.03  CALCIUM 9.2 8.9 9.0 9.3  --  9.0  MG  --   --  1.6*  --  2.8*  1.6*  PHOS  --   --   --  2.2* 3.9 2.5   Liver Function Tests: No results for input(s): AST, ALT, ALKPHOS, BILITOT, PROT, ALBUMIN in the last 168 hours. No results for input(s): LIPASE, AMYLASE in the last 168 hours. No results for input(s): AMMONIA in the last 168 hours. CBC: Recent Labs  Lab 05/29/21 0426 05/31/21 0149 06/03/21 0409  WBC 11.4* 8.9 7.0  NEUTROABS  --  6.2 4.4  HGB 12.3* 11.0* 12.0*  HCT 37.9* 34.7* 37.4*  MCV 84.2 85.7 84.4  PLT 237 190 245   Cardiac Enzymes: No results for input(s): CKTOTAL, CKMB, CKMBINDEX, TROPONINI in the last 168 hours. BNP: Invalid input(s): POCBNP CBG: Recent Labs  Lab 06/02/21 1530 06/02/21 2009 06/03/21 0046 06/03/21 0409 06/03/21 0823  GLUCAP 124* 142* 157* 165* 150*   D-Dimer No results for input(s): DDIMER in the last 72 hours. Hgb A1c No results for input(s): HGBA1C in the last 72 hours. Lipid Profile No results for input(s): CHOL, HDL, LDLCALC, TRIG, CHOLHDL, LDLDIRECT in the last 72 hours. Thyroid function studies No results for input(s): TSH, T4TOTAL, T3FREE, THYROIDAB in the last 72 hours.  Invalid input(s): FREET3 Anemia work up No results for input(s): VITAMINB12, FOLATE, FERRITIN, TIBC, IRON, RETICCTPCT in the last 72 hours. Urinalysis    Component Value Date/Time   COLORURINE YELLOW 05/16/2021 0913   APPEARANCEUR CLEAR 05/16/2021 0913   LABSPEC 1.019 05/16/2021 0913   PHURINE 5.0 05/16/2021 0913   GLUCOSEU NEGATIVE 05/16/2021 0913   HGBUR NEGATIVE 05/16/2021 0913   BILIRUBINUR NEGATIVE 05/16/2021 0913   KETONESUR 20 (A) 05/16/2021 0913   PROTEINUR 100 (A) 05/16/2021 0913   UROBILINOGEN 0.2 08/27/2015 0210   NITRITE NEGATIVE 05/16/2021 0913   LEUKOCYTESUR NEGATIVE 05/16/2021 0913   Sepsis Labs Invalid input(s): PROCALCITONIN,  WBC,  LACTICIDVEN Microbiology Recent Results (from the past 240 hour(s))  Resp Panel by RT-PCR (Flu A&B, Covid) Nasopharyngeal Swab     Status: None   Collection Time:  06/02/21  2:12 PM   Specimen: Nasopharyngeal Swab; Nasopharyngeal(NP) swabs in vial transport medium  Result Value Ref Range Status   SARS Coronavirus 2 by RT PCR NEGATIVE NEGATIVE Final    Comment: (NOTE) SARS-CoV-2 target nucleic acids are NOT DETECTED.  The SARS-CoV-2 RNA is generally detectable in upper respiratory specimens during the acute phase of infection. The lowest concentration of SARS-CoV-2 viral copies this assay can detect is 138 copies/mL. A negative result does not preclude SARS-Cov-2 infection and should not be used as the sole basis for treatment or other patient management decisions. A negative result may occur with  improper specimen collection/handling, submission of specimen other than nasopharyngeal swab, presence of viral mutation(s) within the areas targeted by this assay, and inadequate number of viral copies(<138 copies/mL). A negative result must be combined with clinical observations, patient history, and epidemiological information.  The expected result is Negative.  Fact Sheet for Patients:  EntrepreneurPulse.com.au  Fact Sheet for Healthcare Providers:  IncredibleEmployment.be  This test is no t yet approved or cleared by the [place] FDA and  has been authorized for detection and/or diagnosis of SARS-CoV-2 by FDA under an Emergency Use Authorization (EUA). This EUA will remain  in effect (meaning this test can be used) for the duration of the COVID-19 declaration under Section 564(b)(1) of the Act, 21 U.S.C.section 360bbb-3(b)(1), unless the authorization is terminated  or revoked sooner.       Influenza A by PCR NEGATIVE NEGATIVE Final   Influenza B by PCR NEGATIVE NEGATIVE Final    Comment: (NOTE) The Xpert Xpress SARS-CoV-2/FLU/RSV plus assay is intended as an aid in the diagnosis of influenza from Nasopharyngeal swab specimens and should not be used as a sole basis for treatment. Nasal washings  and aspirates are unacceptable for Xpert Xpress SARS-CoV-2/FLU/RSV testing.  Fact Sheet for Patients: EntrepreneurPulse.com.au  Fact Sheet for Healthcare Providers: IncredibleEmployment.be  This test is not yet approved or cleared by the [place] FDA and has been authorized for detection and/or diagnosis of SARS-CoV-2 by FDA under an Emergency Use Authorization (EUA). This EUA will remain in effect (meaning this test can be used) for the duration of the COVID-19 declaration under Section 564(b)(1) of the Act, 21 U.S.C. section 360bbb-3(b)(1), unless the authorization is terminated or revoked.  Performed at [facility], [place] [address] [place], [place] [address]     Please note: You were cared for by a hospitalist during your hospital stay. Once you are discharged, your primary care physician will handle any further medical issues. Please note that NO REFILLS for any discharge medications will be authorized once you are discharged, as it is imperative that you return to your primary care physician (or establish a relationship with a primary care physician if you do not have one) for your post hospital discharge needs so that they can reassess your need for medications and monitor your lab values.    Time coordinating discharge: 40 minutes  SIGNED:   Shelly Coss, MD  [facility] 06/03/2021, 12:00 PM Pager [phone]  If 7PM-7AM, please contact night-coverage [email or web address] Password [email or web address]

## 2021-06-03 NOTE — Progress Notes (Signed)
   Palliative Medicine Inpatient Follow Up Note  Per Chart review plan to discharge to [place] with [facility] this afternoon.   SW has referred to OP Palliative care to continue ongoing [place].  No additional Palliative needs to be reconciled prior to discharge.   ______________________________________________________________________________________ [place] Team Team Cell Phone: [phone] Please utilize secure chat with additional questions, if there is no response within 30 minutes please call the above phone number  Palliative Medicine Team providers are available by phone from 7am to 7pm daily and can be reached through the team cell phone.  Should this patient require assistance outside of these hours, please call the patient's attending physician.

## 2021-06-18 ENCOUNTER — Non-Acute Institutional Stay: Payer: Medicaid Other

## 2021-06-18 ENCOUNTER — Other Ambulatory Visit: Payer: Self-pay

## 2021-06-18 ENCOUNTER — Non-Acute Institutional Stay: Payer: Medicaid Other | Admitting: *Deleted

## 2021-06-18 DIAGNOSIS — Z515 Encounter for palliative care: Secondary | ICD-10-CM

## 2021-06-18 NOTE — Progress Notes (Signed)
COMMUNITY PALLIATIVE CARE SW NOTE  PATIENT NAME: [name] DOB: 24-Mar-1961 MRN: [id number]  PRIMARY CARE PROVIDER: Boyce Medici, FNP  RESPONSIBLE PARTY:  Acct ID - Guarantor Home Phone Work Phone Relationship Acct Type  [id number] [facility]* [phone]  Self P/F     [address] [place], [place] [address]     PLAN OF CARE and INTERVENTIONS:             GOALS OF CARE/ ADVANCE CARE PLANNING: Patient's goal is to return home. Patient has a MOST form. SOCIAL/EMOTIONAL/SPIRITUAL ASSESSMENT/ INTERVENTIONS:  SW and RN- M. Nadara Mustard (virtually) completed an initial visit with patient at [facility]. Patient was in bed sleeping. He easily aroused to verbal prompts. SW introduced the team to him, palliative care services, visit frequencies and role in his care. Patient stated "help get me out of here". SW reinforced palliative care role in care. He verbalized understanding and consent to services. He denied pain initially, but later stated that he has pain to his abdomen area. He reported that his left side was affected from the past strokes he had. He is on a feeding tube. Patient is verbal, but his speech becomes slurred and nonsensical at times. He is totally dependent for ADL's and he is bedbound. Patient reported that he has not had any falls. He expressed his desire to go home. Patient was able to tell the team that he had a long history of drug use, but has been clean for five years. He is from [place]. He has seven children and has been separated from his wife for years. Patient is open to ongoing support by the palliative care team. SW consulted with the facility SW-K. Love. She advised that the plan is for patient to return home with his sister. His sister is trying to get services and equipment in-home for patient, but is finding difficulty because patient does not have a payor source. SW requested that she (K. Love) keep her updated on the progress. SW attempted to  contact patient's sister-[name] Tacy Dura [phone]), but was unable to reach her or leave a message as her mailbox was full. SW provided supportive presence, assessed patient needs, comfort, goals and safety, provided reassurance of support.  PATIENT/CAREGIVER EDUCATION/ COPING:  Patient appears to be coping well. He does desire to return home with his sister.  PERSONAL EMERGENCY PLAN:  Per facility protocol. COMMUNITY RESOURCES COORDINATION/ HEALTH CARE NAVIGATION:  Patient is currently in [facility]. FINANCIAL/LEGAL CONCERNS/INTERVENTIONS:  Patient has no payor source, and the family is having difficulty securing services and in-home equipment for patient.      SOCIAL HX:  Social History   Tobacco Use   Smoking status: Every Day    Packs/day: 0.25    Types: Cigarettes   Smokeless tobacco: Never  Substance Use Topics   Alcohol use: Not Currently    Alcohol/week: 1.0 - 2.0 standard drink    Types: 1 - 2 Cans of beer per week    Comment: 2 cases of beer a day + liquour    CODE STATUS: To be further assessed ADVANCED DIRECTIVES: No MOST FORM COMPLETE: Yes HOSPICE EDUCATION PROVIDED: No  PPS: Patient is bedbound and dependent for all ADL's. Patient is verbal, but speech is slurred and nonsensical at times due to strokes.  Duration of visit and documentation: 60 minutes  Katheren Puller, LCSW

## 2021-06-20 NOTE — Progress Notes (Signed)
[facility] RN NOTE  PATIENT NAME: Merland Aldana DOB: 09/08/1961 MRN: [id number]  PRIMARY CARE PROVIDER: Boyce Medici, FNP  RESPONSIBLE PARTY: Katharine Look (daughter) Acct ID - Guarantor Home Phone Work Phone Relationship Acct Type  [id number] [facility]* [phone]  Self P/F     [address], [place], [place] [address]   Due to the COVID-19 crisis, this virtual check-in visit was done via telephone from my office and it was initiated and consent by this patient and or family.  PLAN OF CARE and INTERVENTION:  ADVANCE CARE PLANNING/GOALS OF CARE: Patient's goal is to regain strength, to discharge out of the SNF facility and to see all of his children. PATIENT/CAREGIVER EDUCATION: Explained palliative care services and each discipline's role DISEASE STATUS: Joint initial palliative care visit made with [facility], LCSW. I was conferenced in via telehealth. Patient currently resides at [facility]. Patient has had several strokes and has lost function of his left side. He has a history of substance abuse but has been clean for the past 5 years. He is able to answer questions, but speech is difficult to understand at times. He is lying in his hospital bed. He reports mild pain in his abdomen. He is unable to take any food by mouth. He says this causes him to vomit. He is total care with all ADLs. He is non-weight bearing and says he hasn't been ambulatory in about a year. He was hospitalized from 05/12/21 - 05/14/21 due to coffee-ground emesis. He also had not had a BM x 1 week. He was stabilized and discharged back to the SNF. He was then re-admitted from 05/15/21 - 06/03/21 due to intractable nausea and vomiting. He had a G-tube placement initially, but patient did not tolerate the feeding as evidence by several bouts of vomiting. His G-tube was changed to a GJ-tube. Medications are also given via tube. He is wanting to go home and the plan is for  him to go home with his sister. She is working on getting the necessary services and equipment in place, but is running into difficulties due to not having a payor source. He is agreeable to receive future visits from palliative care.  HISTORY OF PRESENT ILLNESS: This is a 60 yo male with a history of alcohol and cocaine abuse, HTN, CHF, multiple strokes, DM II and schizophrenia. Palliative care team has been asked to follow patient for additional support, goals of care and complex decision making.    CODE STATUS: Full code ADVANCED DIRECTIVES: N MOST FORM: yes PPS: 30%   (Duration of visit and documentation 45 minutes)   Daryl Eastern, RN BSN

## 2021-07-09 ENCOUNTER — Encounter (HOSPITAL_COMMUNITY): Payer: Self-pay

## 2021-07-09 ENCOUNTER — Emergency Department (HOSPITAL_COMMUNITY)
Admission: EM | Admit: 2021-07-09 | Discharge: 2021-07-10 | Disposition: A | Discharge status: Home / Self Care | Payer: Medicaid Other | Attending: Emergency Medicine | Admitting: Emergency Medicine

## 2021-07-09 ENCOUNTER — Emergency Department (HOSPITAL_COMMUNITY): Payer: Medicaid Other

## 2021-07-09 ENCOUNTER — Other Ambulatory Visit: Payer: Self-pay

## 2021-07-09 DIAGNOSIS — E119 Type 2 diabetes mellitus without complications: Secondary | ICD-10-CM | POA: Diagnosis not present

## 2021-07-09 DIAGNOSIS — F17210 Nicotine dependence, cigarettes, uncomplicated: Secondary | ICD-10-CM | POA: Diagnosis not present

## 2021-07-09 DIAGNOSIS — S0990XA Unspecified injury of head, initial encounter: Secondary | ICD-10-CM | POA: Insufficient documentation

## 2021-07-09 DIAGNOSIS — Z7902 Long term (current) use of antithrombotics/antiplatelets: Secondary | ICD-10-CM | POA: Insufficient documentation

## 2021-07-09 DIAGNOSIS — Z7984 Long term (current) use of oral hypoglycemic drugs: Secondary | ICD-10-CM | POA: Insufficient documentation

## 2021-07-09 DIAGNOSIS — S40011A Contusion of right shoulder, initial encounter: Secondary | ICD-10-CM | POA: Insufficient documentation

## 2021-07-09 DIAGNOSIS — Y92122 Bedroom in nursing home as the place of occurrence of the external cause: Secondary | ICD-10-CM | POA: Diagnosis not present

## 2021-07-09 DIAGNOSIS — W06XXXA Fall from bed, initial encounter: Secondary | ICD-10-CM | POA: Insufficient documentation

## 2021-07-09 DIAGNOSIS — S4991XA Unspecified injury of right shoulder and upper arm, initial encounter: Secondary | ICD-10-CM | POA: Diagnosis present

## 2021-07-09 DIAGNOSIS — I110 Hypertensive heart disease with heart failure: Secondary | ICD-10-CM | POA: Insufficient documentation

## 2021-07-09 DIAGNOSIS — I5042 Chronic combined systolic (congestive) and diastolic (congestive) heart failure: Secondary | ICD-10-CM | POA: Diagnosis not present

## 2021-07-09 DIAGNOSIS — Z7982 Long term (current) use of aspirin: Secondary | ICD-10-CM | POA: Insufficient documentation

## 2021-07-09 DIAGNOSIS — Z79899 Other long term (current) drug therapy: Secondary | ICD-10-CM | POA: Diagnosis not present

## 2021-07-09 MED ORDER — MORPHINE SULFATE (PF) 2 MG/ML IV SOLN
2.0000 mg | Freq: Once | INTRAVENOUS | Status: AC
  Administered 2021-07-09: 2 mg via INTRAMUSCULAR
  Filled 2021-07-09: qty 1

## 2021-07-09 NOTE — ED Provider Notes (Signed)
[facility] DEPT Provider Note   CSN: [id number] Arrival date & time: 07/09/21  1601     History Chief Complaint  Patient presents with   Shoulder Injury    [name] is a 60 y.o. male.  60 year old male with prior medical history as detailed below presents for evaluation.  Patient reports that he fell from his bed last night at [facility].  He landed on his right shoulder.  He did strike his head.  He denies loss of consciousness. He denies neck pain. He was complaining of pain to the right shoulder today.  His facility performed an x-ray and there was a concern for possible dislocation of the right shoulder.  He was sent to this facility for evaluation.  He denies other injury.  He denies other specific complaint.  He has not taken anything for his pain.  He is able to range his right shoulder without difficulty.  The history is provided by the patient.  Shoulder Injury This is a new problem. The current episode started 12 to 24 hours ago. The problem occurs rarely. The problem has been gradually improving. Pertinent negatives include no chest pain and no shortness of breath. Nothing aggravates the symptoms. Nothing relieves the symptoms.      Past Medical History:  Diagnosis Date   Alcohol abuse    Arthritis    Crack cocaine use    Depression    Diabetes mellitus    Drug abuse ([place])    DTs (delirium tremens) ([place])    history of    Gout    Noncompliance with medication regimen    Schizophrenia ([place])    Stroke ([place])    Systolic heart failure secondary to hypertension ([place])    Uncontrolled hypertension     Patient Active Problem List   Diagnosis Date Noted   Intractable vomiting with nausea, unspecified vomiting type 05/16/2021   Intractable nausea and vomiting 05/15/2021   Hematemesis 05/13/2021   Benign essential HTN    Chronic diastolic congestive heart failure ([place])    Dysphagia, post-stroke    Left pontine  cerebrovascular accident ([place]) 04/20/2020   CVA (cerebral vascular accident) ([place]) 04/18/2020   Right sided weakness 04/17/2020   History of CVA with residual deficit 04/17/2020   Chronic combined systolic (congestive) and diastolic (congestive) heart failure ([place]) 04/17/2020   Substance use disorder 04/17/2020   Abnormal EKG 02/07/2020   Left hip pain 02/07/2020   AKI (acute kidney injury) ([place]) [id number]   Systolic heart failure secondary to hypertension ([place])    Schizophrenia ([place])    Acute systolic heart failure ([place]) 02/02/2018   Chest pain 01/31/2018   Hypokalemia    Schizoaffective disorder, bipolar type ([place]) 11/22/2017   Trauma    Tachycardia 03/03/2017   Alcohol abuse    Cocaine abuse ([place])    Hypertensive urgency    Tachycardia    Cocaine abuse with cocaine-induced mood disorder ([place]) 12/05/2016   Cerebrovascular disease 09/02/2015   Neurocognitive disorder, unspecified secondary to cerebrovascular disease 09/02/2015   Malignant hypertension 08/30/2015   Alcohol use disorder, severe, dependence ([place]) 08/30/2015   Alcohol withdrawal ([place]) 08/30/2015   Alcohol abuse with alcohol-induced mood disorder ([place]) 08/30/2015   Thrombocytopenia ([place]) 08/27/2015   Tobacco use disorder 08/26/2015   Hyperlipidemia associated with type 2 diabetes mellitus ([place]) 04/28/2015   Schizoaffective disorder, depressive type ([place])    Hypertension associated with diabetes ([place]) 08/09/2013   Gout 08/09/2013   Diabetes mellitus ([place]) 07/09/2012  Past Surgical History:  Procedure Laterality Date   ESOPHAGOGASTRODUODENOSCOPY (EGD) WITH PROPOFOL N/A 05/25/2021   Procedure: ESOPHAGOGASTRODUODENOSCOPY (EGD) WITH PROPOFOL;  Surgeon: Clarene Essex, MD;  Location: [facility];  Service: Endoscopy;  Laterality: N/A;   IR GASTR TUBE CONVERT GASTR-JEJ PER W/FL MOD SED  05/31/2021   IR GASTROSTOMY TUBE MOD SED  05/28/2021   LEFT HEART CATH AND CORONARY ANGIOGRAPHY N/A 02/03/2018   Procedure: LEFT HEART  CATH AND CORONARY ANGIOGRAPHY;  Surgeon: Jettie Booze, MD;  Location: [facility];  Service: Cardiovascular;  Laterality: N/A;   MANDIBLE RECONSTRUCTION     TONSILLECTOMY         Family History  Problem Relation Age of Onset   Hypertension Other     Social History   Tobacco Use   Smoking status: Every Day    Packs/day: 0.25    Types: Cigarettes   Smokeless tobacco: Never  Vaping Use   Vaping Use: Former  Substance Use Topics   Alcohol use: Not Currently    Alcohol/week: 1.0 - 2.0 standard drink    Types: 1 - 2 Cans of beer per week    Comment: 2 cases of beer a day + liquour   Drug use: Not Currently    Types: "Crack" cocaine, Cocaine    Home Medications Prior to Admission medications   Medication Sig Start Date End Date Taking? Authorizing Provider  amLODipine (NORVASC) 10 MG tablet Take 1 tablet (10 mg total) by mouth daily. 05/16/20   Angiulli, Lavon Paganini, PA-C  aspirin EC 81 MG tablet Take 81 mg by mouth every morning. Swallow whole.    [provider]  atorvastatin (LIPITOR) 40 MG tablet Take 1 tablet (40 mg total) by mouth daily. 05/15/20   Angiulli, Lavon Paganini, PA-C  bacitracin ointment Apply 1 application topically 2 (two) times daily. 05/12/21   Couture, Cortni S, PA-C  bisacodyl (DULCOLAX) 10 MG suppository Place 10 mg rectally daily as needed for mild constipation.    [provider]  carvedilol (COREG) 25 MG tablet Take 1 tablet (25 mg total) by mouth 2 (two) times daily with a meal. 05/15/20   Angiulli, Lavon Paganini, PA-C  chlorthalidone (HYGROTON) 50 MG tablet Take 50 mg by mouth every morning.    [provider]  clopidogrel (PLAVIX) 75 MG tablet Take 1 tablet (75 mg total) by mouth daily. 05/15/20   Angiulli, Lavon Paganini, PA-C  Dextrose-Sodium Chloride (DEXTROSE 5 % AND 0.9% NACL) 5-0.9 % Inject into the vein See admin instructions. Administer IV fluids PIV at 75 ml/hr for 1 liter,then stop Patient not taking: No sig reported     [provider]  docusate sodium (COLACE) 100 MG capsule Take 100 mg by mouth 2 (two) times daily.    [provider]  FLUoxetine (PROZAC) 10 MG capsule Take 1 capsule (10 mg total) by mouth daily. 05/15/20   Angiulli, Lavon Paganini, PA-C  losartan (COZAAR) 25 MG tablet Take 75 mg by mouth every morning.    [provider]  metFORMIN (GLUCOPHAGE) 500 MG tablet Take 1 tablet (500 mg total) by mouth 2 (two) times daily with a meal. 05/15/20   Angiulli, Lavon Paganini, PA-C  metoCLOPramide (REGLAN) 5 MG/5ML solution Place 10 mLs (10 mg total) into feeding tube every 6 (six) hours as needed for up to 7 days for nausea. 06/03/21 06/10/21  Shelly Coss, MD  Nutritional Supplements (FEEDING SUPPLEMENT, OSMOLITE 1.5 CAL,) LIQD Place 1,000 mLs into feeding tube continuous. 06/03/21  Shelly Coss, MD  Nutritional Supplements (FEEDING SUPPLEMENT, PROSOURCE TF,) liquid Place 30 mLs into feeding tube 2 (two) times daily. 06/03/21   Shelly Coss, MD  ondansetron (ZOFRAN-ODT) 4 MG disintegrating tablet Take 1 tablet (4 mg total) by mouth every 6 (six) hours. 06/03/21   Shelly Coss, MD  paliperidone (INVEGA) 6 MG 24 hr tablet Take 6 mg by mouth every morning.    [provider]  pantoprazole (PROTONIX) 40 MG tablet Take 1 tablet (40 mg total) by mouth 2 (two) times daily before a meal. 05/14/21 05/14/22  Gherghe, Vella [place]head, MD  polyethylene glycol (MIRALAX / GLYCOLAX) 17 g packet Take 17 g by mouth 2 (two) times daily. Mix in 8 oz liquid and drink    [provider]  potassium chloride (KLOR-CON) 20 MEQ packet Take 40 mEq by mouth daily for 5 days. 06/04/21 06/09/21  Shelly Coss, MD  senna (SENOKOT) 8.6 MG TABS tablet Take 2 tablets by mouth at bedtime.    [provider]    Allergies    Patient has no known allergies.  Review of Systems   Review of Systems  Respiratory:  Negative for shortness of breath.   Cardiovascular:  Negative for chest pain.  All other  systems reviewed and are negative.  Physical Exam Updated Vital Signs BP (!) 155/107 (BP Location: Left Arm)   Pulse 73   Temp 97.6 F (36.4 C) (Oral)   Resp 19   Ht '5\' 11"'$  (1.803 m)   Wt 87 kg   SpO2 100%   BMI 26.75 kg/m   Physical Exam Vitals and nursing note reviewed.  Constitutional:      General: He is not in acute distress.    Appearance: Normal appearance. He is well-developed.  HENT:     Head: Normocephalic and atraumatic.  Eyes:     Conjunctiva/sclera: Conjunctivae normal.     Pupils: Pupils are equal, round, and reactive to light.  Cardiovascular:     Rate and Rhythm: Normal rate and regular rhythm.     Heart sounds: Normal heart sounds.  Pulmonary:     Effort: Pulmonary effort is normal. No respiratory distress.     Breath sounds: Normal breath sounds.  Abdominal:     General: There is no distension.     Palpations: Abdomen is soft.     Tenderness: There is no abdominal tenderness.  Musculoskeletal:        General: No deformity. Normal range of motion.     Cervical back: Normal range of motion and neck supple.     Comments: Passive and active range of motion of the right shoulder.  Distal right upper extremity is neurovascular intact.  Skin:    General: Skin is warm and dry.  Neurological:     General: No focal deficit present.     Mental Status: He is alert and oriented to person, place, and time.    ED Results / Procedures / Treatments   Labs (all labs ordered are listed, but only abnormal results are displayed) Labs Reviewed - No data to display  EKG None  Radiology No results found.  Procedures Procedures   Medications Ordered in ED Medications  morphine 2 MG/ML injection 2 mg (has no administration in time range)    ED Course  I have reviewed the triage vital signs and the nursing notes.  Pertinent labs & imaging results that were available during my care of the patient were reviewed by me and considered in my  medical decision  making (see chart for details).    MDM Rules/Calculators/A&P                           MDM  MSE complete  [name] was evaluated in Emergency Department on 07/09/2021 for the symptoms described in the history of present illness. He was evaluated in the context of the global COVID-19 pandemic, which necessitated consideration that the patient might be at risk for infection with the SARS-CoV-2 virus that causes COVID-19. Institutional protocols and algorithms that pertain to the evaluation of patients at risk for COVID-19 are in a state of rapid change based on information released by regulatory bodies including the CDC and federal and state organizations. These policies and algorithms were followed during the patient's care in the ED.  Patient presented for evaluation after reported fall.  Patient without evidence of significant traumatic injury on evaluation.  Imaging studies are without significant acute pathology.  Patient is appropriate for further outpatient management.  Importance of close follow-up is stressed.  Strict return precautions given and understood.    Final Clinical Impression(s) / ED Diagnoses Final diagnoses:  Contusion of right shoulder, initial encounter    Rx / DC Orders ED Discharge Orders     None        Valarie Merino, MD 07/09/21 1815

## 2021-07-09 NOTE — ED Notes (Signed)
Called [facility] for transport back to facility 

## 2021-07-09 NOTE — Discharge Instructions (Addendum)
Return for any problem.  X-rays of your shoulder do not reveal fracture or dislocation.  Your shoulder pain is related to likely contusion secondary to fall.  CT imaging of your head also does not reveal significant injury related to fall.  No evidence of skull fracture or bleeding in the brain.

## 2021-07-09 NOTE — ED Notes (Signed)
Attempted to call report to facility with no answer x2.

## 2021-07-09 NOTE — ED Triage Notes (Signed)
S/p fall out of bed last night. Pt resides at [facility] and facility performed xray with dislocation to right shoulder.

## 2021-07-10 NOTE — ED Notes (Signed)
Attempted to call report to facility x 3, no answer.

## 2021-09-13 ENCOUNTER — Other Ambulatory Visit: Payer: Self-pay

## 2021-09-13 ENCOUNTER — Non-Acute Institutional Stay: Payer: Medicaid Other

## 2021-09-13 VITALS — BP 122/76 | HR 74 | Temp 97.9°F | Resp 18

## 2021-09-13 DIAGNOSIS — Z515 Encounter for palliative care: Secondary | ICD-10-CM

## 2021-09-13 NOTE — Progress Notes (Signed)
PATIENT NAME: [name] DOB: February 16, 1961 MRN: [id number]  PRIMARY CARE PROVIDER: Boyce Medici, FNP  RESPONSIBLE PARTY:  Acct ID - Guarantor Home Phone Work Phone Relationship Acct Type  [id number] [facility]* [phone]  Self P/F     [address] [place], [place] [address]    PLAN OF CARE and INTERVENTIONS:               1.  GOALS OF CARE/ ADVANCE CARE PLANNING:  Full Code, Full Scope of medical interventions, Feeding Tube, IVF, and ABT per MOST form.                2.  PATIENT/CAREGIVER EDUCATION:  Palliative Care               4. PERSONAL EMERGENCY PLAN:  Activate 911 for emergencies.                5.  DISEASE STATUS:  Patient found in his room, just completed lunch.  Education provided on Palliative Care services.   Denies any issues with pain.  Denies chest pain, nausea, vomiting or headaches.   Recently sent to ED for PEG tube replacement.  Patient is uncertain if the PEG tube is still being used.    Reports ongoing weakness and wanting to participate in physical therapy.  Staff uncertain if patient is working with therapy at this time.  Patient requiring extensive assistance with ADL's due to CVA.    No issues voiced regarding insomnia.  Typically naps during the daytime.    HISTORY OF PRESENT ILLNESS:  60 year old male with a history of CVA, CHF, HTN and dysphagia.  Patient is being followed by Palliative Care every 4-8 weeks and PRN.   CODE STATUS: Full ADVANCED DIRECTIVES: Yes MOST FORM: Yes PPS: 30%   PHYSICAL EXAM:   VITALS: Today's Vitals   09/13/21 1704  BP: 122/76  Pulse: 74  Resp: 18  Temp: 97.9 F (36.6 C)  SpO2: 99%  PainSc: 0-No pain    LUNGS: clear to auscultation  CARDIAC: Cor RRR}  EXTREMITIES: - for edema SKIN: Skin color, texture, turgor normal. No rashes or lesions or normal  NEURO: positive for gait problems, memory problems, and weakness       Lorenza Burton, RN

## 2021-10-30 ENCOUNTER — Other Ambulatory Visit (HOSPITAL_COMMUNITY): Payer: Self-pay | Admitting: Internal Medicine

## 2021-10-30 ENCOUNTER — Telehealth (HOSPITAL_COMMUNITY): Payer: Self-pay

## 2021-10-30 DIAGNOSIS — Z431 Encounter for attention to gastrostomy: Secondary | ICD-10-CM

## 2021-10-30 NOTE — Telephone Encounter (Signed)
Called [facility] to schedule peg removal, no answer, left vm. AW

## 2021-11-05 ENCOUNTER — Telehealth (HOSPITAL_COMMUNITY): Payer: Self-pay

## 2021-11-08 ENCOUNTER — Ambulatory Visit (HOSPITAL_COMMUNITY)
Admission: RE | Admit: 2021-11-08 | Discharge: 2021-11-08 | Disposition: A | Discharge status: Home / Self Care | Payer: Medicaid Other | Source: Ambulatory Visit | Attending: Internal Medicine | Admitting: Internal Medicine

## 2021-11-08 ENCOUNTER — Other Ambulatory Visit: Payer: Self-pay

## 2021-11-08 DIAGNOSIS — Z431 Encounter for attention to gastrostomy: Secondary | ICD-10-CM

## 2021-11-08 HISTORY — PX: IR GASTROSTOMY TUBE REMOVAL: IMG5492

## 2021-11-08 MED ORDER — LIDOCAINE VISCOUS HCL 2 % MT SOLN
OROMUCOSAL | Status: AC
  Filled 2021-11-08: qty 15

## 2021-11-08 NOTE — Procedures (Signed)
Pre-procedure diagnosis: Dysphagia Post-procedure diagnosis: Same  Successful bedside removal of intact pull through G-tube with co-axial J limb. Retention disc and J limb removed in tact. No immediate post procedural complications. EBL: zero  Please see imaging section of Epic for full dictation.  Joaquim Nam 11/08/2021 12:30 PM

## 2022-03-26 IMAGING — DX DG ABD PORTABLE 1V
1 series · 1 of 1 positions shown · non-contrast
Comparison: 05/15/21

CLINICAL DATA: Evaluate feeding tube placement

EXAM:
PORTABLE ABDOMEN - 1 VIEW

[abdomen]
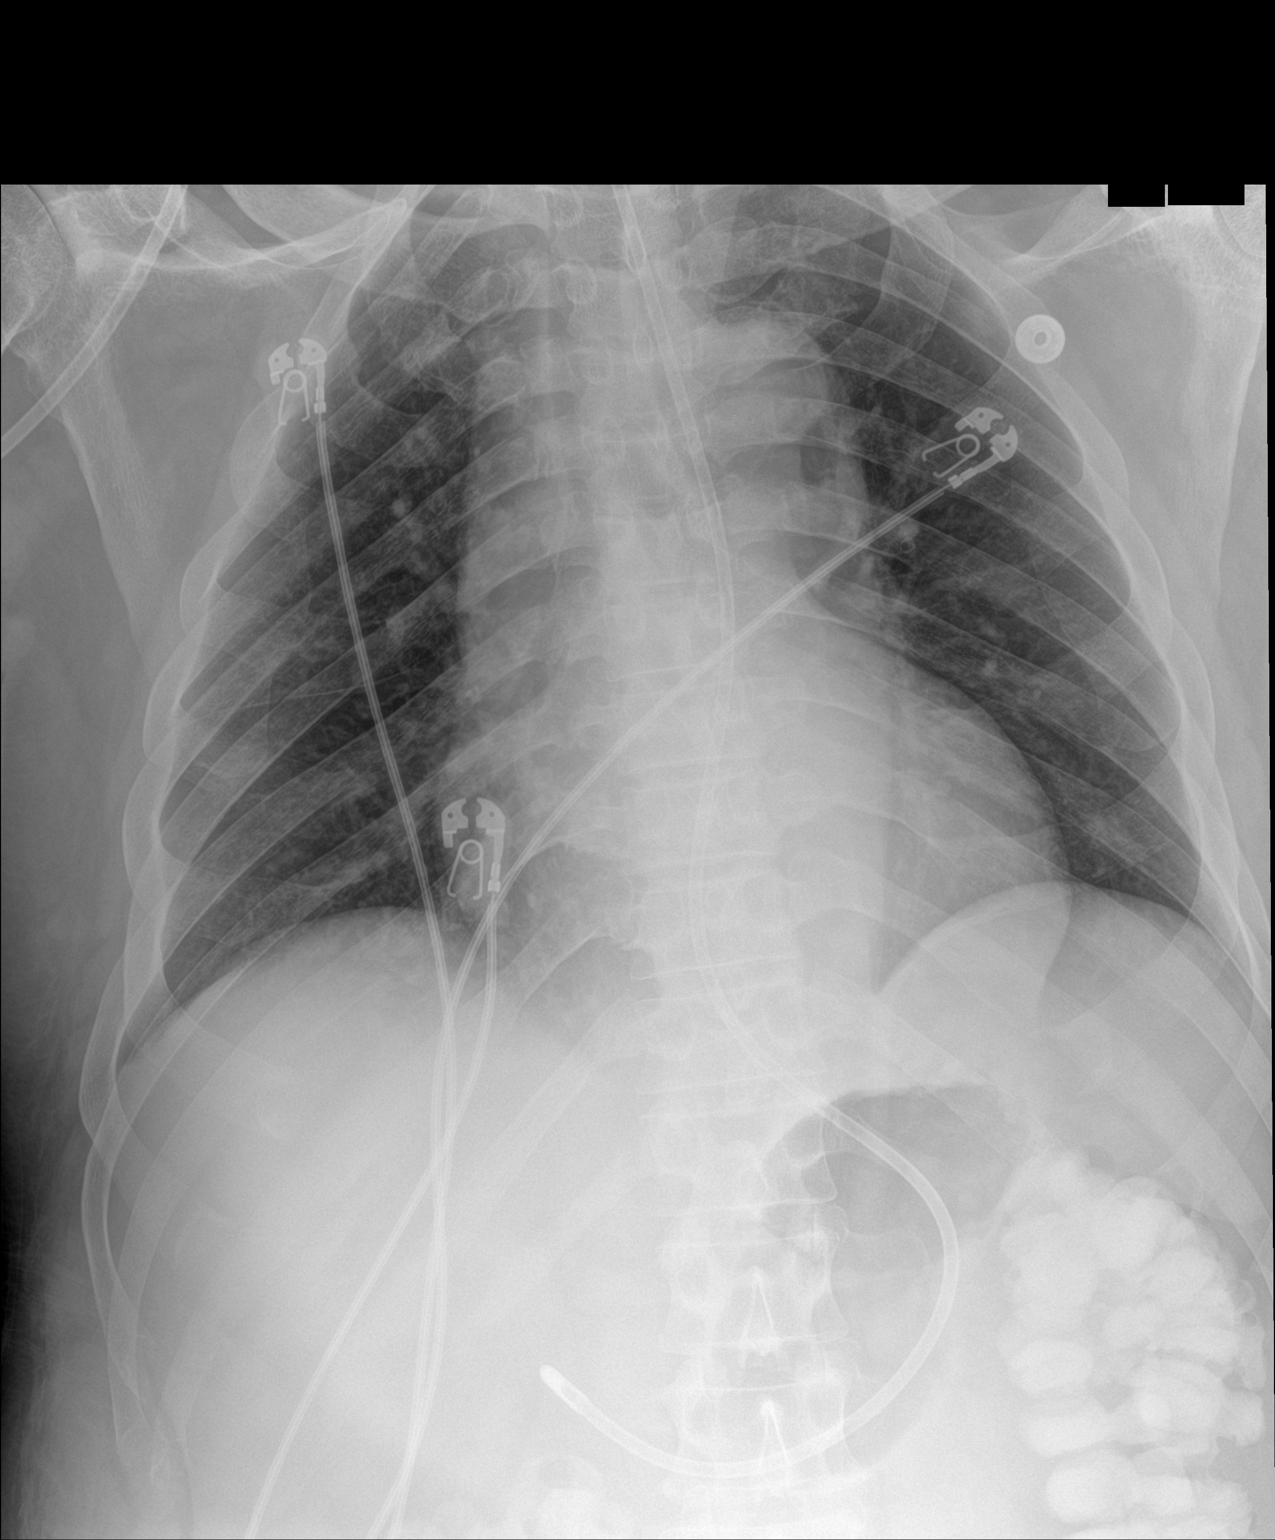

[1 of 1 positions shown; findings below may reference images not displayed]

FINDINGS: The feeding tube tip is well below the level of the GE junction
projecting over the expected location of the gastric antrum. Heart
size and mediastinal contours appear stable. Lungs are clear.
Enteric contrast material identified within the colon at the level
of the hepatic flexure.
IMPRESSION: Feeding tube tip projects over the gastric antrum.

## 2022-10-01 ENCOUNTER — Encounter: Payer: Self-pay | Admitting: Family Medicine

## 2022-10-01 ENCOUNTER — Non-Acute Institutional Stay: Payer: Medicaid Other | Admitting: Family Medicine

## 2022-10-01 VITALS — BP 146/98 | HR 86 | Resp 20

## 2022-10-01 DIAGNOSIS — I7121 Aneurysm of the ascending aorta, without rupture: Secondary | ICD-10-CM

## 2022-10-01 DIAGNOSIS — Z515 Encounter for palliative care: Secondary | ICD-10-CM

## 2022-10-01 NOTE — Progress Notes (Unsigned)
[facility] Palliative Care Consult Note Telephone: [phone]  Fax: [phone]    Date of encounter: 10/01/22 2:59 PM PATIENT NAME: [name] [address] [place] [address]   [phone] (home)  DOB: [birth year] MRN: [id number] PRIMARY CARE PROVIDER:    Boyce Brown, [place],  [place] [address] [phone]  REFERRING PROVIDER:   Boyce Brown, [address] [place],  [place] [address] [phone]  RESPONSIBLE PARTY:    Contact Information     Name Relation Home Work Mobile   [name] Daughter   [phone]   [name] Relative   [phone]   [name] Relative   [phone]        I met face to face with patient in his LTC facility. Palliative Care was asked to follow this patient by consultation request of  [name], [name] Breeze, FNP to address advance care planning and complex medical decision making. This is a follow up visit   ASSESSMENT , SYMPTOM MANAGEMENT AND PLAN / RECOMMENDATIONS:   Ascending thoracic aortic aneurysm without rupture Noted at 40 mm on last echo 04/18/20 with no repeat imaging or echo. Not a good surgical candidate given his hx but need update on goals of care. If goals are to maximize comfort then would not pursue repeat imaging. Encourage maintaining BP < 140/90 to prevent rapid enlargement.  2. Palliative Care Encounter Currently listed as full code, need to check if legal guardian or [facility] POA to discuss goals of care and/or with patient.  Advance Care Planning/Goals of Care:  CODE STATUS: Current code status, full code, need to address with family and pt     Follow up Palliative Care Visit: Palliative care will continue to follow for complex medical decision making, advance care planning, and clarification of goals. Return 4 weeks or prn.    This visit was coded based on medical decision making (MDM).  PPS: 40%  HOSPICE  ELIGIBILITY/DIAGNOSIS: TBD  Chief Complaint:  Palliative Care is continuing to follow patient for chronic medical management in setting of hx of CVA with residual right sided weakness and to assist with  refining and defining goals of care.  HISTORY OF PRESENT ILLNESS:  [name] is a 61 y.o. year old male with hx of CVA with residual right sided weakness, HTN, chronic combined systolic and diastolic heart failure in setting of multisubstance abuse with alcohol and crack cocaine.  He also has Bipolar schizoaffective disorder/schizophrenia with sustained Invega injections, gout,.  Pt c/o pain in left upper gums and when he chews, missing multiple teeth. Denies any drainage from mouth or fever.  Has to eat soft foods like grilled cheese sandwiches because he is missing multiple teeth.  He reports no care from Podiatrist with concern for losing his feet.  He states having pain in both feet.  He moves left side well, has some movement right side but not good fine motor control.  Has some dysarthria.  Facility staff indicates he is incontinent of bowel and bladder, total care for everything but feeding himself. He states he has not been out of the bed or out of the room in a long time.  States his sister is "dead", family is "dead" and people he used to get high with are "dead".    History obtained from review of EMR, discussion with primary caregiver-facility NP, facility staff and/or [name].  Last echo 04/18/20:   IMPRESSIONS    1. Left ventricular ejection fraction 45  to 50%. The left ventricle has mildly decreased function and global hypokinesis. Left ventricular diastolic parameters are indeterminate.  2. Right ventricular systolic function and size are normal.   3. The mitral valve is normal in structure with trivial  Regurgitation and no mitral stenosis.  4. Unable to determine valve morphology due to image quality. Aortic valve regurgitation is not visualized. No aortic  stenosis is present.  5. Aortic dilatation noted. 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  6. The inferior vena cava is dilated in size with >50% respiratory variability, suggesting right atrial pressure of 8 mmHg.   I reviewed EMR for available labs, medications, imaging, studies and related documents.  There are no new records in EMR since 11/08/21.   ROS General: NAD EYES: denies vision changes ENMT: endorses difficulty chewing contributing to dysphagia Cardiovascular: denies chest pain, denies DOE Pulmonary: denies cough, denies increased SOB Abdomen: endorses good appetite, denies constipation, endorses incontinence of bowel GU: denies dysuria, endorses incontinence of urine MSK:  right sided weakness is baseline, no falls reported Skin: denies rashes or wounds Neurological: endorses pain in right upper gums and in bilat feet, denies insomnia Psych: Endorses frustration Heme/lymph/immuno: denies bruises, abnormal bleeding  Physical Exam: Current and past weights: no current weight, last weight 1921 lbs 12.8 ounces from 07/09/21 Constitutional: NAD General:WN ENMT: intact hearing, oral mucous membranes moist, missing dentition on upper left and lower left, no abscess noted. Remaining dentition in varying stages of repair CV: S1S2, RRR, no LE edema Pulmonary: LCTA, no increased work of breathing, no cough, room air Abdomen: normo-active BS + 4 quadrants, soft and non tender, no ascites GU: deferred MSK: no sarcopenia, moves all extremities, bedbound or wc bound Skin: warm and dry, no rashes or wounds on visible skin Neuro:  right sided weakness, dysarthric speech but understandable Psych: non-anxious affect, A and O x 3 Hem/lymph/immuno: no widespread bruising   Thank you for the opportunity to participate in the care of [name].  The palliative care team will continue to follow. Please call our office at [phone] if we can be of additional  assistance.   [name] Conception, FNP -C  COVID-19 PATIENT SCREENING TOOL Asked and negative response unless otherwise noted:   Have you had symptoms of covid, tested positive or been in contact with someone with symptoms/positive test in the past 5-10 days?  unknown

## 2022-10-03 ENCOUNTER — Encounter: Payer: Self-pay | Admitting: Family Medicine

## 2022-10-03 DIAGNOSIS — Z515 Encounter for palliative care: Secondary | ICD-10-CM | POA: Insufficient documentation

## 2022-10-03 DIAGNOSIS — I7121 Aneurysm of the ascending aorta, without rupture: Secondary | ICD-10-CM | POA: Insufficient documentation

## 2023-02-12 ENCOUNTER — Encounter: Payer: Self-pay | Admitting: Family Medicine

## 2023-02-12 ENCOUNTER — Non-Acute Institutional Stay: Payer: Medicaid Other | Admitting: Family Medicine

## 2023-02-12 VITALS — BP 150/92 | HR 74 | Temp 98.6°F | Resp 18

## 2023-02-12 DIAGNOSIS — I10 Essential (primary) hypertension: Secondary | ICD-10-CM

## 2023-02-12 DIAGNOSIS — I5032 Chronic diastolic (congestive) heart failure: Secondary | ICD-10-CM

## 2023-02-12 NOTE — Progress Notes (Signed)
[facility] Consult Note Telephone: [phone]  Fax: [phone]   Date of encounter: 02/12/23 3:57 PM PATIENT NAME: [name] [address] [place] [address]   [phone] (home)  DOB: 26-Jun-1961 MRN: [id number] PRIMARY CARE PROVIDER:    Boyce Medici, [place],  [place] [address] [id number]  REFERRING PROVIDER:   Boyce Medici, [place],  [place] [address] [place] Agent/Health Care Power of Attorney:    Contact Information     Name Relation Home Work Mobile   [place] Daughter   [phone]   Anderson,Martavious Relative   [phone]   Grace,Donnell Relative   [phone]        I met face to face with patient in [place]. Palliative Care was asked to follow this patient by consultation request of Odem, Coolidge Breeze, FNP to address advance care planning and complex medical decision making. This is a follow up visit.    CODE STATUS: Full Code   ASSESSMENT AND / RECOMMENDATIONS:  PPS: [id number]  Chronic diastolic heart failure Stable, appears euvolemic. Continue ARB, thiazide diuretic and beta blocker. Maintain BP goal of 140/90 or less   2.     Hypertension Stable, slightly above goal  Consider addition of Norvasc 2.5 mg daily at HS.    Follow up Palliative Care Visit:  Palliative Care continuing to follow up by monitoring for changes in appetite, weight, functional and cognitive status for chronic disease progression and management in agreement with patient's stated goals of care. Next visit in 4 weeks or prn.  This visit was coded based on medical decision making (MDM).  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  HISTORY OF PRESENT ILLNESS: [name] is a 62 y.o. year  old male with hx of CVA with right sided hemiparesis and hx of multisubstance use. Denies pain, SOB, nausea, vomiting.  Had just returned from therapy, self propelling in wc.   ACTIVITIES OF DAILY LIVING: CONTINENT OF BLADDER/BOWEL-No BATHING/DRESSING/FEEDING-Independent with feeding, assist with bathing and dressing  MOBILITY:   AMBULATORY WITH ASSISTIVE DEVICE: WHEELCHAIR  APPETITE? 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   CVA (cerebral vascular accident) ([place]) 04/18/2020   Right sided weakness 04/17/2020   History of CVA with residual deficit 04/17/2020   Chronic combined systolic (congestive) and diastolic (congestive) heart failure ([place]) 04/17/2020   Substance use disorder 04/17/2020   Abnormal EKG 02/07/2020   Left hip pain 02/07/2020   AKI (acute kidney injury) ([place]) [id number]   Systolic heart failure secondary to hypertension ([place])    Schizophrenia ([place])    Acute systolic heart failure ([place]) 02/02/2018   Chest pain 01/31/2018   Hypokalemia    Schizoaffective disorder, bipolar type ([place]) 11/22/2017   Trauma    Tachycardia 03/03/2017   Alcohol abuse    Cocaine abuse ([place])    Hypertensive urgency    Tachycardia    Cocaine abuse with  cocaine-induced mood disorder ([place]) 12/05/2016   Cerebrovascular disease 09/02/2015   Neurocognitive disorder, unspecified secondary to cerebrovascular disease 09/02/2015   Malignant hypertension 08/30/2015   Alcohol use disorder, severe, dependence ([place]) 08/30/2015   Alcohol withdrawal ([place])  08/30/2015   Alcohol abuse with alcohol-induced mood disorder ([place]) 08/30/2015   Thrombocytopenia ([place]) 08/27/2015   Tobacco use disorder 08/26/2015   Hyperlipidemia associated with type 2 diabetes mellitus ([place]) 04/28/2015   Schizoaffective disorder, depressive type ([place])    Hypertension associated with diabetes ([place]) 08/09/2013   Gout 08/09/2013   Diabetes mellitus ([place]) 07/09/2012   PAST MEDICAL HISTORY:  Active Ambulatory Problems    Diagnosis Date Noted   Diabetes mellitus ([place]) 07/09/2012   Hypertension associated with diabetes ([place]) 08/09/2013   Gout 08/09/2013   Schizoaffective disorder, depressive type ([place])    Hyperlipidemia associated with type 2 diabetes mellitus ([place]) 04/28/2015   Tobacco use disorder 08/26/2015   Thrombocytopenia ([place]) 08/27/2015   Malignant hypertension 08/30/2015   Alcohol use disorder, severe, dependence ([place]) 08/30/2015   Alcohol withdrawal ([place]) 08/30/2015   Alcohol abuse with alcohol-induced mood disorder ([place]) 08/30/2015   Cerebrovascular disease 09/02/2015   Neurocognitive disorder, unspecified secondary to cerebrovascular disease 09/02/2015   Cocaine abuse with cocaine-induced mood disorder ([place]) 12/05/2016   Alcohol abuse    Cocaine abuse ([place])    Hypertensive urgency    Tachycardia    Tachycardia 03/03/2017   Trauma    Schizoaffective disorder, bipolar type ([place]) 11/22/2017   Hypokalemia    Chest pain [id number]   Acute systolic heart failure ([place]) [id number]   Systolic heart failure secondary to hypertension ([place])    Schizophrenia ([place])    Abnormal EKG 02/07/2020   Left hip pain 02/07/2020   AKI (acute kidney injury) ([place]) 02/07/2020   Right sided weakness 04/17/2020   History of CVA with residual deficit 04/17/2020   Chronic combined systolic (congestive) and diastolic (congestive) heart failure ([place]) 04/17/2020   Substance use disorder 04/17/2020   CVA (cerebral vascular accident) ([place]) 04/18/2020   Left pontine  cerebrovascular accident ([place]) 04/20/2020   Dysphagia, post-stroke    Chronic diastolic congestive heart failure (HCC)    Benign essential HTN    Hematemesis 05/13/2021   Intractable nausea and vomiting 05/15/2021   Intractable vomiting with nausea, unspecified vomiting type 05/16/2021   Aneurysm of ascending aorta without rupture ([place]) 10/03/2022   Palliative care encounter 10/03/2022   Resolved Ambulatory Problems    Diagnosis Date Noted   CVA (cerebral infarction) 07/09/2012   Hyperglycemia without ketosis 07/09/2012   Polysubstance abuse ([place]) 07/09/2012   Cocaine abuse ([place]) 07/09/2012   Hypertension 07/09/2012   Substance induced mood disorder ([place]) 08/08/2013   Suicidal ideations 08/09/2013   Chronic alcohol abuse 08/09/2013   Alcohol withdrawal ([place]) 08/09/2013   Hyperglycemia 08/09/2013   Alcohol dependence with uncomplicated withdrawal ([place]) 06/25/2014   Depression 06/26/2014   Substance abuse ([place]) 10/23/2014   Hallucinations    Schizophrenia, acute undifferentiated ([place]) 04/26/2015   Alcohol use disorder, severe, dependence ([place]) 04/27/2015   Cocaine use disorder, severe, dependence ([place]) 04/27/2015   Chest pain 08/26/2015   Altered mental status 08/26/2015   Pain in the chest    Alcohol use disorder, moderate, dependence ([place]) 08/29/2015   Past Medical History:  Diagnosis Date   Arthritis    Crack cocaine use    Diabetes mellitus    Drug abuse ([place])    DTs (delirium tremens) ([place])    Noncompliance with medication regimen  Stroke [facility])    Uncontrolled hypertension       Preferred Pharmacy: ALLERGIES: No Known Allergies   PERTINENT MEDICATIONS:  Outpatient Encounter Medications as of 02/12/2023  Medication Sig   amLODipine (NORVASC) 10 MG tablet Take 1 tablet (10 mg total) by mouth daily.   aspirin EC 81 MG tablet Take 81 mg by mouth every morning. Swallow whole.   atorvastatin (LIPITOR) 40 MG tablet Take 1 tablet (40 mg total) by mouth daily.    bisacodyl (DULCOLAX) 10 MG suppository Place 10 mg rectally daily as needed for mild constipation.   carvedilol (COREG) 25 MG tablet Take 1 tablet (25 mg total) by mouth 2 (two) times daily with a meal.   chlorthalidone (HYGROTON) 50 MG tablet Take 50 mg by mouth every morning.   clopidogrel (PLAVIX) 75 MG tablet Take 1 tablet (75 mg total) by mouth daily.   docusate sodium (COLACE) 100 MG capsule Take 100 mg by mouth 2 (two) times daily.   FLUoxetine (PROZAC) 10 MG capsule Take 1 capsule (10 mg total) by mouth daily.   losartan (COZAAR) 100 MG tablet Take 100 mg by mouth every morning.   metFORMIN (GLUCOPHAGE) 500 MG tablet Take 1 tablet (500 mg total) by mouth 2 (two) times daily with a meal.   ondansetron (ZOFRAN-ODT) 4 MG disintegrating tablet Take 1 tablet (4 mg total) by mouth every 6 (six) hours.   polyethylene glycol (MIRALAX / GLYCOLAX) 17 g packet Take 17 g by mouth 2 (two) times daily. Mix in 8 oz liquid and drink   senna (SENOKOT) 8.6 MG TABS tablet Take 2 tablets by mouth at bedtime.   [DISCONTINUED] bacitracin ointment Apply 1 application topically 2 (two) times daily.   [DISCONTINUED] Dextrose-Sodium Chloride (DEXTROSE 5 % AND 0.9% NACL) 5-0.9 % Inject into the vein See admin instructions. Administer IV fluids PIV at 75 ml/hr for 1 liter,then stop (Patient not taking: No sig reported)   [DISCONTINUED] metoCLOPramide (REGLAN) 5 MG/5ML solution Place 10 mLs (10 mg total) into feeding tube every 6 (six) hours as needed for up to 7 days for nausea.   [DISCONTINUED] Nutritional Supplements (FEEDING SUPPLEMENT, OSMOLITE 1.5 CAL,) LIQD Place 1,000 mLs into feeding tube continuous.   [DISCONTINUED] Nutritional Supplements (FEEDING SUPPLEMENT, PROSOURCE TF,) liquid Place 30 mLs into feeding tube 2 (two) times daily.   [DISCONTINUED] paliperidone (INVEGA) 6 MG 24 hr tablet Take 6 mg by mouth every morning.   [DISCONTINUED] pantoprazole (PROTONIX) 40 MG tablet Take 1 tablet (40 mg total) by  mouth 2 (two) times daily before a meal.   [DISCONTINUED] potassium chloride (KLOR-CON) 20 MEQ packet Take 40 mEq by mouth daily for 5 days.   No facility-administered encounter medications on file as of 02/12/2023.    History obtained from review of EMR, discussion with facility staff and patient.    I reviewed available labs, medications, imaging, studies and related documents from the EMR.  There were no new records/imaging since last visit.   Physical Exam: GENERAL: NAD LUNGS: CTAB, no increased work of breathing, room air CARDIAC:  S1S2, RRR with no MRG, No edema/cyanosis ABD:  Normo-active BS x 4 quads, soft, non-tender EXTREMITIES: Normal ROM, no deformity, strength equal, No muscle atrophy/subcutaneous fat loss NEURO:  right sided weakness, no cognitive impairment PSYCH:  no anxiety, mildly irritability, A & O x 3  Thank you for the opportunity to participate in the care of [employer]. Please call our main office at [phone] if we can be of additional  assistance.    Damaris Hippo FNP-C  [email or web address] .[facility]  Phone:  [phone]

## 2023-09-02 ENCOUNTER — Emergency Department (HOSPITAL_COMMUNITY): Payer: Medicaid Other

## 2023-09-02 ENCOUNTER — Other Ambulatory Visit: Payer: Self-pay

## 2023-09-02 ENCOUNTER — Encounter (HOSPITAL_COMMUNITY): Payer: Self-pay

## 2023-09-02 ENCOUNTER — Emergency Department (HOSPITAL_COMMUNITY)
Admission: EM | Admit: 2023-09-02 | Discharge: 2023-09-03 | Disposition: A | Discharge status: Home / Self Care | Payer: Medicaid Other | Attending: Emergency Medicine | Admitting: Emergency Medicine

## 2023-09-02 DIAGNOSIS — Z7984 Long term (current) use of oral hypoglycemic drugs: Secondary | ICD-10-CM | POA: Insufficient documentation

## 2023-09-02 DIAGNOSIS — Z8673 Personal history of transient ischemic attack (TIA), and cerebral infarction without residual deficits: Secondary | ICD-10-CM | POA: Insufficient documentation

## 2023-09-02 DIAGNOSIS — Z7982 Long term (current) use of aspirin: Secondary | ICD-10-CM | POA: Diagnosis not present

## 2023-09-02 DIAGNOSIS — E119 Type 2 diabetes mellitus without complications: Secondary | ICD-10-CM | POA: Insufficient documentation

## 2023-09-02 DIAGNOSIS — I110 Hypertensive heart disease with heart failure: Secondary | ICD-10-CM | POA: Insufficient documentation

## 2023-09-02 DIAGNOSIS — I509 Heart failure, unspecified: Secondary | ICD-10-CM | POA: Diagnosis not present

## 2023-09-02 DIAGNOSIS — Z79899 Other long term (current) drug therapy: Secondary | ICD-10-CM | POA: Insufficient documentation

## 2023-09-02 DIAGNOSIS — Z7902 Long term (current) use of antithrombotics/antiplatelets: Secondary | ICD-10-CM | POA: Diagnosis not present

## 2023-09-02 DIAGNOSIS — R059 Cough, unspecified: Secondary | ICD-10-CM | POA: Diagnosis present

## 2023-09-02 DIAGNOSIS — R531 Weakness: Secondary | ICD-10-CM | POA: Diagnosis not present

## 2023-09-02 DIAGNOSIS — U071 COVID-19: Secondary | ICD-10-CM | POA: Insufficient documentation

## 2023-09-02 LAB — TROPONIN I (HIGH SENSITIVITY)
Troponin I (High Sensitivity): 11 ng/L (ref <18)
Troponin I (High Sensitivity): 16 ng/L (ref <18)

## 2023-09-02 LAB — CBC
HCT: 41 % (ref 39.0–52.0)
Hemoglobin: 12.9 g/dL — ABNORMAL LOW (ref 13.0–17.0)
MCH: 27.2 pg (ref 26.0–34.0)
MCHC: 31.5 g/dL (ref 30.0–36.0)
MCV: 86.3 fL (ref 80.0–100.0)
Platelets: 188 10*3/uL (ref 150–400)
RBC: 4.75 MIL/uL (ref 4.22–5.81)
RDW: 14.1 % (ref 11.5–15.5)
WBC: 8.2 10*3/uL (ref 4.0–10.5)
nRBC: 0 % (ref 0.0–0.2)

## 2023-09-02 LAB — BASIC METABOLIC PANEL
Anion gap: 14 (ref 5–15)
BUN: 16 mg/dL (ref 6–20)
CO2: 23 mmol/L (ref 22–32)
Calcium: 9.6 mg/dL (ref 8.9–10.3)
Chloride: 102 mmol/L (ref 98–111)
Creatinine, Ser: 1.38 mg/dL — ABNORMAL HIGH (ref 0.61–1.24)
GFR, Estimated: 59 mL/min — ABNORMAL LOW (ref >60)
Glucose, Bld: 183 mg/dL — ABNORMAL HIGH (ref 70–99)
Potassium: 3.4 mmol/L — ABNORMAL LOW (ref 3.5–5.1)
Sodium: 139 mmol/L (ref 135–145)

## 2023-09-02 LAB — BRAIN NATRIURETIC PEPTIDE: B Natriuretic Peptide: 45.6 pg/mL (ref 0.0–100.0)

## 2023-09-02 LAB — RESP PANEL BY RT-PCR (RSV, FLU A&B, COVID)  RVPGX2
Influenza A by PCR: NEGATIVE
Influenza B by PCR: NEGATIVE
Resp Syncytial Virus by PCR: NEGATIVE
SARS Coronavirus 2 by RT PCR: POSITIVE — AB

## 2023-09-02 MED ORDER — ACETAMINOPHEN 325 MG PO TABS
650.0000 mg | ORAL_TABLET | Freq: Once | ORAL | Status: AC
  Administered 2023-09-02: 650 mg via ORAL
  Filled 2023-09-02: qty 2

## 2023-09-02 NOTE — ED Provider Notes (Signed)
  Physical Exam  BP (!) 154/124 (BP Location: Right Arm)   Pulse 90   Temp 99.1 F (37.3 C) (Oral)   Resp 16   Ht 5\' 11"  (1.803 m)   Wt 86.6 kg   SpO2 98%   BMI 26.64 kg/m   Physical Exam Vitals and nursing note reviewed.  Constitutional:      General: He is not in acute distress.    Appearance: Normal appearance. He is normal weight. He is not ill-appearing.  HENT:     Head: Normocephalic and atraumatic.  Pulmonary:     Effort: Pulmonary effort is normal. No respiratory distress.  Abdominal:     General: Abdomen is flat.  Musculoskeletal:        General: Normal range of motion.     Cervical back: Neck supple.  Skin:    General: Skin is warm and dry.  Neurological:     Mental Status: He is alert and oriented to person, place, and time.  Psychiatric:        Mood and Affect: Mood normal.        Behavior: Behavior normal.     Procedures  Procedures  ED Course / MDM   Clinical Course as of 09/03/23 0055  Wed Sep 03, 2023  0053 Attempted to contact daughter who is patient's POA.  No answer [AS]    Clinical Course User Index [AS] Brion Sossamon, Edsel Petrin, PA-C   Medical Decision Making Amount and/or Complexity of Data Reviewed Labs: ordered. Radiology: ordered.  Risk OTC drugs.  Assumed care at shift change from previous provider.  Please see previous note for full HPI.  In short, 62 year old male with significant history of prior CVAs, bedbound presenting for evaluation of cough.  Patient was found to have COVID here in the emergency department.  Chest x-ray is reassuring.  Family at bedside states that he has been acting slightly weaker than normal lately so a CT of the head was ordered.  Plan at the time of shift change is to reassess after CT of the head.  If CT is negative for acute abnormalities, patient will likely be stable for discharge home.  He is already on aspirin and Plavix.  0045-CT head without acute abnormalities.  With significant chronic changes.   Patient states he feels fine to go back to his facility.  Attempted to contact legal guardian without success.  At this time there does not appear to be any evidence of an acute emergency medical condition and the patient appears stable for discharge with appropriate outpatient follow up. Diagnosis was discussed with patient who verbalizes understanding of care plan and is agreeable to discharge. I have discussed return precautions with patient who verbalizes understanding. Patient encouraged to follow-up with their PCP within 1 week. All questions answered.  Note: Portions of this report may have been transcribed using voice recognition software. Every effort was made to ensure accuracy; however, inadvertent computerized transcription errors may still be present.    Michelle Piper, PA-C 09/03/23 0055    Melene Plan, DO 09/03/23 1458

## 2023-09-02 NOTE — ED Triage Notes (Signed)
Patient complains of chest pain that is produced when coughing  patient is from [facility] facility.  Reports it has been going on for 2 days.  No other complaints.  BP 120/95  HR 92 RA 98%

## 2023-09-02 NOTE — ED Provider Notes (Incomplete)
  Physical Exam  BP (!) 154/124 (BP Location: Right Arm)   Pulse 90   Temp 99.1 F (37.3 C) (Oral)   Resp 16   Ht 5\' 11"  (1.803 m)   Wt 86.6 kg   SpO2 98%   BMI 26.64 kg/m   Physical Exam  Procedures  Procedures  ED Course / MDM    Medical Decision Making Amount and/or Complexity of Data Reviewed Labs: ordered. Radiology: ordered.  Risk OTC drugs.  Assumed care at shift change from previous provider.  Please see previous note for full HPI.  In short, 62 year old male with significant history of prior CVAs, bedbound presenting for evaluation of cough.  Patient was found to have COVID here in the emergency department.  Chest x-ray is reassuring.  Family at bedside states that he has been acting slightly weaker than normal lately so a CT of the head was ordered.  Plan at the time of shift change is to reassess after CT of the head.  If CT is negative for acute abnormalities, patient will likely be stable for discharge home.  He is already on aspirin and Plavix.  At this time there does not appear to be any evidence of an acute emergency medical condition and the patient appears stable for discharge with appropriate outpatient follow up. Diagnosis was discussed with patient who verbalizes understanding of care plan and is agreeable to discharge. I have discussed return precautions with patient and *** who verbalizes understanding. Patient encouraged to follow-up with their PCP within ***. All questions answered.  Patient's case discussed with Dr. Marland Kitchen who agrees with plan to discharge with follow-up.   Note: Portions of this report may have been transcribed using voice recognition software. Every effort was made to ensure accuracy; however, inadvertent computerized transcription errors may still be present.

## 2023-09-02 NOTE — Discharge Instructions (Signed)
Tylenol or Motrin for pain  Follow up outpatient  Return for new or worsening symptoms

## 2023-09-02 NOTE — ED Notes (Signed)
Pt transported to CT ?

## 2023-09-02 NOTE — ED Provider Notes (Signed)
[place] EMERGENCY DEPARTMENT AT [facility] Provider Note   CSN: [id number] Arrival date & time: 09/02/23  1814     History  Chief Complaint  Patient presents with   Cough    [name] is a 62 y.o. male medical history for diabetes, hypertension, schizoaffective, prior CVA with residual deficits, CHF polysubstance use, ascending aortic aneurysm here for evaluation of cough.  2-3 days ago.  He has had some chills without documented fever.  He resides at [facility] facility.  He states when he coughs he gets some pain in his chest however if he is still he has no pain.  He denies any headache, nausea, vomiting, shortness of breath, abdominal pain.  He feels generally weak.  He denies any new numbness.  He is incontinent of bowel and bladder at baseline  Per family and prior palliative note regarding his thoracic aortic aneurysm, he was determined not to be a good surgical candidate given his history.  Family states goal would be comfort at that point.   He is not ambulatory at baseline.  HPI     Home Medications Prior to Admission medications   Medication Sig Start Date End Date Taking? Authorizing Provider  amLODipine (NORVASC) 10 MG tablet Take 1 tablet (10 mg total) by mouth daily. 05/16/20   Angiulli, Mcarthur Rossetti, PA-C  aspirin EC 81 MG tablet Take 81 mg by mouth every morning. Swallow whole.    [provider]  atorvastatin (LIPITOR) 40 MG tablet Take 1 tablet (40 mg total) by mouth daily. 05/15/20   Angiulli, Mcarthur Rossetti, PA-C  bisacodyl (DULCOLAX) 10 MG suppository Place 10 mg rectally daily as needed for mild constipation.    [provider]  carvedilol (COREG) 25 MG tablet Take 1 tablet (25 mg total) by mouth 2 (two) times daily with a meal. 05/15/20   Angiulli, Mcarthur Rossetti, PA-C  chlorthalidone (HYGROTON) 50 MG tablet Take 50 mg by mouth every morning.    [provider]  clopidogrel (PLAVIX) 75 MG tablet Take 1 tablet (75 mg total) by  mouth daily. 05/15/20   Angiulli, Mcarthur Rossetti, PA-C  docusate sodium (COLACE) 100 MG capsule Take 100 mg by mouth 2 (two) times daily.    [provider]  FLUoxetine (PROZAC) 10 MG capsule Take 1 capsule (10 mg total) by mouth daily. 05/15/20   Angiulli, Mcarthur Rossetti, PA-C  losartan (COZAAR) 100 MG tablet Take 100 mg by mouth every morning.    [provider]  metFORMIN (GLUCOPHAGE) 500 MG tablet Take 1 tablet (500 mg total) by mouth 2 (two) times daily with a meal. 05/15/20   Angiulli, Mcarthur Rossetti, PA-C  ondansetron (ZOFRAN-ODT) 4 MG disintegrating tablet Take 1 tablet (4 mg total) by mouth every 6 (six) hours. 06/03/21   Burnadette Pop, MD  polyethylene glycol (MIRALAX / GLYCOLAX) 17 g packet Take 17 g by mouth 2 (two) times daily. Mix in 8 oz liquid and drink    [provider]  senna (SENOKOT) 8.6 MG TABS tablet Take 2 tablets by mouth at bedtime.    [provider]      Allergies    Patient has no known allergies.    Review of Systems   Review of Systems  Constitutional:  Positive for chills.  HENT:  Positive for congestion and rhinorrhea. Negative for sore throat, trouble swallowing and voice change.   Respiratory:  Positive for cough. Negative for apnea, choking, chest tightness, shortness of breath, wheezing and stridor.  Cardiovascular:  Positive for chest pain (with cough).  Gastrointestinal: Negative.   Genitourinary: Negative.   Musculoskeletal: Negative.   Skin: Negative.   Neurological:  Positive for weakness.  All other systems reviewed and are negative.   Physical Exam Updated Vital Signs BP (!) 154/124 (BP Location: Right Arm)   Pulse 90   Temp 99.1 F (37.3 C) (Oral)   Resp 16   Ht 5\' 11"  (1.803 m)   Wt 86.6 kg   SpO2 98%   BMI 26.64 kg/m  Physical Exam Vitals and nursing note reviewed.  Constitutional:      General: He is not in acute distress.    Appearance: He is well-developed. He is ill-appearing (chronically ill appearing). He  is not toxic-appearing or diaphoretic.  HENT:     Head: Normocephalic and atraumatic.     Nose: Congestion and rhinorrhea present.     Mouth/Throat:     Mouth: Mucous membranes are dry.  Eyes:     Pupils: Pupils are equal, round, and reactive to light.  Neck:     Comments: Moves without difficulty Cardiovascular:     Rate and Rhythm: Normal rate and regular rhythm.     Pulses: Normal pulses.     Heart sounds: Normal heart sounds.  Pulmonary:     Effort: Pulmonary effort is normal. No respiratory distress.     Breath sounds: Rhonchi present.  Abdominal:     General: Bowel sounds are normal. There is no distension.     Palpations: Abdomen is soft.     Tenderness: There is no abdominal tenderness. There is no guarding or rebound.  Musculoskeletal:     Cervical back: Normal range of motion and neck supple.     Comments: Compartments soft, No bony tenderness  Skin:    General: Skin is warm and dry.     Comments: Refuses to roll to side for sacral exam for wounds  Neurological:     Mental Status: He is alert.     Motor: Weakness present.     Comments: Tongue midline, no obvious facial droop Garbled speech- similar to prior according to  daughter in room Difficulty with ROM to RUE and RLE. Non ambulatory at baseline Cannot perform finger to nose due to prior CVA deficets     ED Results / Procedures / Treatments   Labs (all labs ordered are listed, but only abnormal results are displayed) Labs Reviewed  RESP PANEL BY RT-PCR (RSV, FLU A&B, COVID)  RVPGX2 - Abnormal; Notable for the following components:      Result Value   SARS Coronavirus 2 by RT PCR POSITIVE (*)    All other components within normal limits  BASIC METABOLIC PANEL - Abnormal; Notable for the following components:   Potassium 3.4 (*)    Glucose, Bld 183 (*)    Creatinine, Ser 1.38 (*)    GFR, Estimated 59 (*)    All other components within normal limits  CBC - Abnormal; Notable for the following components:    Hemoglobin 12.9 (*)    All other components within normal limits  BRAIN NATRIURETIC PEPTIDE  TROPONIN I (HIGH SENSITIVITY)  TROPONIN I (HIGH SENSITIVITY)    EKG None  Radiology DG Chest 2 View  Result Date: 09/02/2023 CLINICAL DATA:  Cough. EXAM: CHEST - 2 VIEW COMPARISON:  May 15, 2021 FINDINGS: The heart size and mediastinal contours are within normal limits. Both lungs are clear. Chronic changes are seen involving the distal right clavicle. No acute  osseous abnormality is identified. IMPRESSION: No active cardiopulmonary disease. Electronically Signed   By: Aram Candela M.D.   On: 09/02/2023 21:26    Procedures Procedures    Medications Ordered in ED Medications  acetaminophen (TYLENOL) tablet 650 mg (650 mg Oral Given 09/02/23 2127)    ED Course/ Medical Decision Making/ A&P   62 year old here for evaluation of cough and chest pain.  Began 2 days ago.  Associated chills, no documented fever.  On exam he has rhonchi however no tachycardia, tachypnea, hypoxia.  Is not ambulatory at baseline, significant deficits from prior CVA.  Temperature 100.0 here.  He refuses to roll for rectal temperature or to view sacrum for wounds given he is not mobile.  Will plan on labs, imaging and reassess  Labs and imaging personally viewed and interpreted:  Xray chest without pneumonia, pneumothorax, cardiomegaly, pulm edema CBC without leukocytosis Metabolic panel creatinine 1.38 mild increase from prior however last labs greater than 1 year ago BNP 45 Trop 11  I went to reassess patient and now his daughter is present.  She states his weakness is worse than normal and she thinks he had another stroke.  She is requesting CT head.  He does have significant deficits from prior strokes at baseline.  Will obtain CT head.  Care transferred to oncoming provider who will follow up on remaining labs, imaging and determine dispo  Click here for ABCD2, HEART and other calculatorsREFRESH Note  before signing :1}                              Medical Decision Making Amount and/or Complexity of Data Reviewed Independent Historian: EMS    Details: Daughter in room External Data Reviewed: labs, radiology, ECG and notes. Labs: ordered. Decision-making details documented in ED Course. Radiology: ordered and independent interpretation performed. Decision-making details documented in ED Course. ECG/medicine tests: ordered and independent interpretation performed. Decision-making details documented in ED Course.  Risk OTC drugs. Prescription drug management. Parenteral controlled substances. Decision regarding hospitalization. Diagnosis or treatment significantly limited by social determinants of health.           Final Clinical Impression(s) / ED Diagnoses Final diagnoses:  COVID  Weakness    Rx / DC Orders ED Discharge Orders     None         Attallah Ontko A, PA-C 09/02/23 2304    Melene Plan, DO 09/02/23 2308
